# Patient Record
Sex: Male | Born: 1935 | Race: White | Hispanic: No | Smoking: Former smoker
Health system: Southern US, Community
[De-identification: ages and names within clinical notes are randomized; demographics above are authoritative.]

## PROBLEM LIST (undated history)

## (undated) DIAGNOSIS — Z950 Presence of cardiac pacemaker: Secondary | ICD-10-CM

## (undated) DIAGNOSIS — F329 Major depressive disorder, single episode, unspecified: Secondary | ICD-10-CM

## (undated) DIAGNOSIS — I701 Atherosclerosis of renal artery: Secondary | ICD-10-CM

## (undated) DIAGNOSIS — H348392 Tributary (branch) retinal vein occlusion, unspecified eye, stable: Secondary | ICD-10-CM

## (undated) DIAGNOSIS — Z860100 Personal history of colon polyps, unspecified: Secondary | ICD-10-CM

## (undated) DIAGNOSIS — I10 Essential (primary) hypertension: Secondary | ICD-10-CM

## (undated) DIAGNOSIS — J449 Chronic obstructive pulmonary disease, unspecified: Secondary | ICD-10-CM

## (undated) DIAGNOSIS — Z87891 Personal history of nicotine dependence: Secondary | ICD-10-CM

## (undated) DIAGNOSIS — E785 Hyperlipidemia, unspecified: Secondary | ICD-10-CM

## (undated) DIAGNOSIS — L409 Psoriasis, unspecified: Secondary | ICD-10-CM

## (undated) DIAGNOSIS — Z952 Presence of prosthetic heart valve: Secondary | ICD-10-CM

## (undated) DIAGNOSIS — I5032 Chronic diastolic (congestive) heart failure: Secondary | ICD-10-CM

## (undated) DIAGNOSIS — M1990 Unspecified osteoarthritis, unspecified site: Secondary | ICD-10-CM

## (undated) DIAGNOSIS — E119 Type 2 diabetes mellitus without complications: Secondary | ICD-10-CM

## (undated) DIAGNOSIS — N3941 Urge incontinence: Secondary | ICD-10-CM

## (undated) DIAGNOSIS — I5030 Unspecified diastolic (congestive) heart failure: Secondary | ICD-10-CM

## (undated) DIAGNOSIS — I350 Nonrheumatic aortic (valve) stenosis: Secondary | ICD-10-CM

## (undated) DIAGNOSIS — F32A Depression, unspecified: Secondary | ICD-10-CM

## (undated) DIAGNOSIS — I4892 Unspecified atrial flutter: Secondary | ICD-10-CM

## (undated) DIAGNOSIS — I2510 Atherosclerotic heart disease of native coronary artery without angina pectoris: Secondary | ICD-10-CM

## (undated) DIAGNOSIS — I6529 Occlusion and stenosis of unspecified carotid artery: Secondary | ICD-10-CM

## (undated) DIAGNOSIS — I480 Paroxysmal atrial fibrillation: Secondary | ICD-10-CM

## (undated) DIAGNOSIS — I459 Conduction disorder, unspecified: Secondary | ICD-10-CM

## (undated) DIAGNOSIS — I700 Atherosclerosis of aorta: Secondary | ICD-10-CM

## (undated) DIAGNOSIS — I441 Atrioventricular block, second degree: Secondary | ICD-10-CM

## (undated) DIAGNOSIS — K635 Polyp of colon: Secondary | ICD-10-CM

## (undated) DIAGNOSIS — K219 Gastro-esophageal reflux disease without esophagitis: Secondary | ICD-10-CM

## (undated) DIAGNOSIS — S32010A Wedge compression fracture of first lumbar vertebra, initial encounter for closed fracture: Secondary | ICD-10-CM

## (undated) HISTORY — DX: Personal history of colonic polyps: Z86.010

## (undated) HISTORY — DX: Major depressive disorder, single episode, unspecified: F32.9

## (undated) HISTORY — DX: Wedge compression fracture of first lumbar vertebra, initial encounter for closed fracture: S32.010A

## (undated) HISTORY — DX: Essential (primary) hypertension: I10

## (undated) HISTORY — DX: Personal history of colon polyps, unspecified: Z86.0100

## (undated) HISTORY — DX: Depression, unspecified: F32.A

## (undated) HISTORY — DX: Atherosclerosis of renal artery: I70.1

## (undated) HISTORY — DX: Chronic obstructive pulmonary disease, unspecified: J44.9

## (undated) HISTORY — DX: Hyperlipidemia, unspecified: E78.5

## (undated) HISTORY — DX: Unspecified osteoarthritis, unspecified site: M19.90

## (undated) HISTORY — DX: Urge incontinence: N39.41

## (undated) HISTORY — PX: CORONARY ANGIOPLASTY: SHX604

## (undated) HISTORY — DX: Occlusion and stenosis of unspecified carotid artery: I65.29

## (undated) HISTORY — DX: Polyp of colon: K63.5

## (undated) HISTORY — DX: Type 2 diabetes mellitus without complications: E11.9

## (undated) HISTORY — DX: Psoriasis, unspecified: L40.9

## (undated) HISTORY — DX: Atherosclerosis of aorta: I70.0

## (undated) HISTORY — PX: TONSILLECTOMY: SUR1361

## (undated) HISTORY — DX: Atherosclerotic heart disease of native coronary artery without angina pectoris: I25.10

## (undated) HISTORY — DX: Gastro-esophageal reflux disease without esophagitis: K21.9

## (undated) HISTORY — DX: Tributary (branch) retinal vein occlusion, unspecified eye, stable: H34.8392

---

## 1993-10-04 DIAGNOSIS — E119 Type 2 diabetes mellitus without complications: Secondary | ICD-10-CM

## 1993-10-04 HISTORY — DX: Type 2 diabetes mellitus without complications: E11.9

## 1997-10-04 DIAGNOSIS — I6529 Occlusion and stenosis of unspecified carotid artery: Secondary | ICD-10-CM

## 1997-10-04 HISTORY — PX: CAROTID ENDARTERECTOMY: SUR193

## 1997-10-04 HISTORY — DX: Occlusion and stenosis of unspecified carotid artery: I65.29

## 2001-10-04 DIAGNOSIS — K219 Gastro-esophageal reflux disease without esophagitis: Secondary | ICD-10-CM

## 2001-10-04 HISTORY — DX: Gastro-esophageal reflux disease without esophagitis: K21.9

## 2001-10-04 HISTORY — PX: UPPER GASTROINTESTINAL ENDOSCOPY: SHX188

## 2001-10-04 HISTORY — PX: COLONOSCOPY: SHX174

## 2002-10-04 DIAGNOSIS — I701 Atherosclerosis of renal artery: Secondary | ICD-10-CM

## 2002-10-04 DIAGNOSIS — I2510 Atherosclerotic heart disease of native coronary artery without angina pectoris: Secondary | ICD-10-CM

## 2002-10-04 HISTORY — DX: Atherosclerosis of renal artery: I70.1

## 2002-10-04 HISTORY — DX: Atherosclerotic heart disease of native coronary artery without angina pectoris: I25.10

## 2003-03-05 ENCOUNTER — Inpatient Hospital Stay (HOSPITAL_COMMUNITY): Admission: EM | Admit: 2003-03-05 | Discharge: 2003-03-12 | Payer: Self-pay | Admitting: Emergency Medicine

## 2003-03-05 ENCOUNTER — Encounter: Payer: Self-pay | Admitting: Emergency Medicine

## 2003-03-06 HISTORY — PX: CARDIAC CATHETERIZATION: SHX172

## 2003-03-07 ENCOUNTER — Encounter: Payer: Self-pay | Admitting: Cardiothoracic Surgery

## 2003-03-07 HISTORY — PX: CORONARY ARTERY BYPASS GRAFT: SHX141

## 2003-03-08 ENCOUNTER — Encounter: Payer: Self-pay | Admitting: Cardiothoracic Surgery

## 2003-03-09 ENCOUNTER — Encounter: Payer: Self-pay | Admitting: Cardiothoracic Surgery

## 2003-03-10 ENCOUNTER — Encounter: Payer: Self-pay | Admitting: Cardiothoracic Surgery

## 2003-04-26 ENCOUNTER — Encounter: Admission: RE | Admit: 2003-04-26 | Discharge: 2003-04-26 | Payer: Self-pay | Admitting: Cardiothoracic Surgery

## 2003-04-26 ENCOUNTER — Encounter: Payer: Self-pay | Admitting: Cardiothoracic Surgery

## 2003-10-05 DIAGNOSIS — K635 Polyp of colon: Secondary | ICD-10-CM

## 2003-10-05 HISTORY — DX: Polyp of colon: K63.5

## 2004-07-18 ENCOUNTER — Emergency Department: Payer: Self-pay | Admitting: Emergency Medicine

## 2004-07-18 ENCOUNTER — Emergency Department (HOSPITAL_COMMUNITY): Admission: EM | Admit: 2004-07-18 | Discharge: 2004-07-19 | Payer: Self-pay

## 2004-08-24 ENCOUNTER — Ambulatory Visit (HOSPITAL_COMMUNITY): Admission: RE | Admit: 2004-08-24 | Discharge: 2004-08-24 | Payer: Self-pay | Admitting: Neurological Surgery

## 2005-06-14 ENCOUNTER — Ambulatory Visit: Payer: Self-pay | Admitting: Family Medicine

## 2005-07-20 ENCOUNTER — Ambulatory Visit: Payer: Self-pay | Admitting: Internal Medicine

## 2005-09-02 ENCOUNTER — Ambulatory Visit: Payer: Self-pay | Admitting: Internal Medicine

## 2005-10-06 ENCOUNTER — Ambulatory Visit: Payer: Self-pay | Admitting: Internal Medicine

## 2005-11-30 ENCOUNTER — Ambulatory Visit: Payer: Self-pay | Admitting: Internal Medicine

## 2006-03-02 ENCOUNTER — Ambulatory Visit: Payer: Self-pay | Admitting: Internal Medicine

## 2006-06-01 ENCOUNTER — Ambulatory Visit: Payer: Self-pay | Admitting: Internal Medicine

## 2006-08-10 ENCOUNTER — Ambulatory Visit: Payer: Self-pay | Admitting: Internal Medicine

## 2006-08-15 ENCOUNTER — Ambulatory Visit: Payer: Self-pay | Admitting: Internal Medicine

## 2007-01-31 ENCOUNTER — Encounter: Payer: Self-pay | Admitting: Internal Medicine

## 2007-01-31 DIAGNOSIS — E7800 Pure hypercholesterolemia, unspecified: Secondary | ICD-10-CM

## 2007-01-31 DIAGNOSIS — D126 Benign neoplasm of colon, unspecified: Secondary | ICD-10-CM | POA: Insufficient documentation

## 2007-01-31 DIAGNOSIS — F528 Other sexual dysfunction not due to a substance or known physiological condition: Secondary | ICD-10-CM

## 2007-01-31 DIAGNOSIS — E1165 Type 2 diabetes mellitus with hyperglycemia: Secondary | ICD-10-CM

## 2007-01-31 DIAGNOSIS — I2510 Atherosclerotic heart disease of native coronary artery without angina pectoris: Secondary | ICD-10-CM | POA: Insufficient documentation

## 2007-01-31 DIAGNOSIS — J309 Allergic rhinitis, unspecified: Secondary | ICD-10-CM | POA: Insufficient documentation

## 2007-01-31 DIAGNOSIS — K219 Gastro-esophageal reflux disease without esophagitis: Secondary | ICD-10-CM

## 2007-01-31 DIAGNOSIS — L408 Other psoriasis: Secondary | ICD-10-CM

## 2007-01-31 DIAGNOSIS — F411 Generalized anxiety disorder: Secondary | ICD-10-CM | POA: Insufficient documentation

## 2007-01-31 DIAGNOSIS — I10 Essential (primary) hypertension: Secondary | ICD-10-CM

## 2007-01-31 DIAGNOSIS — E118 Type 2 diabetes mellitus with unspecified complications: Secondary | ICD-10-CM

## 2007-02-13 ENCOUNTER — Ambulatory Visit: Payer: Self-pay | Admitting: Internal Medicine

## 2007-02-14 LAB — CONVERTED CEMR LAB
Albumin: 4 g/dL (ref 3.5–5.2)
BUN: 15 mg/dL (ref 6–23)
CO2: 30 meq/L (ref 19–32)
Calcium: 9.2 mg/dL (ref 8.4–10.5)
Chloride: 106 meq/L (ref 96–112)
Cholesterol: 116 mg/dL (ref 0–200)
Creatinine, Ser: 0.9 mg/dL (ref 0.4–1.5)
Creatinine,U: 30.8 mg/dL
GFR calc Af Amer: 107 mL/min
GFR calc non Af Amer: 88 mL/min
Glucose, Bld: 222 mg/dL — ABNORMAL HIGH (ref 70–99)
HDL: 29.5 mg/dL — ABNORMAL LOW (ref >39.0)
Hgb A1c MFr Bld: 9.9 % — ABNORMAL HIGH (ref 4.6–6.0)
LDL Cholesterol: 61 mg/dL (ref 0–99)
Microalb Creat Ratio: 22.7 mg/g (ref 0.0–30.0)
Microalb, Ur: 0.7 mg/dL (ref 0.0–1.9)
Phosphorus: 3.6 mg/dL (ref 2.3–4.6)
Potassium: 4.5 meq/L (ref 3.5–5.1)
Sodium: 140 meq/L (ref 135–145)
TSH: 1.35 microintl units/mL (ref 0.35–5.50)
Total CHOL/HDL Ratio: 3.9
Triglycerides: 126 mg/dL (ref 0–149)
VLDL: 25 mg/dL (ref 0–40)

## 2007-03-14 ENCOUNTER — Encounter: Payer: Self-pay | Admitting: Internal Medicine

## 2007-03-15 ENCOUNTER — Ambulatory Visit: Payer: Self-pay | Admitting: Internal Medicine

## 2007-03-30 ENCOUNTER — Ambulatory Visit: Payer: Self-pay | Admitting: Internal Medicine

## 2007-06-19 ENCOUNTER — Ambulatory Visit: Payer: Self-pay | Admitting: Internal Medicine

## 2007-06-19 DIAGNOSIS — R1013 Epigastric pain: Secondary | ICD-10-CM | POA: Insufficient documentation

## 2007-06-20 LAB — CONVERTED CEMR LAB
ALT: 41 units/L (ref 0–53)
AST: 46 units/L — ABNORMAL HIGH (ref 0–37)
Albumin: 4 g/dL (ref 3.5–5.2)
Alkaline Phosphatase: 71 units/L (ref 39–117)
Amylase: 38 units/L (ref 27–131)
BUN: 14 mg/dL (ref 6–23)
Basophils Absolute: 0.1 10*3/uL (ref 0.0–0.1)
Basophils Relative: 1.5 % — ABNORMAL HIGH (ref 0.0–1.0)
Bilirubin, Direct: 0.1 mg/dL (ref 0.0–0.3)
CO2: 30 meq/L (ref 19–32)
Calcium: 9.6 mg/dL (ref 8.4–10.5)
Chloride: 101 meq/L (ref 96–112)
Creatinine, Ser: 0.9 mg/dL (ref 0.4–1.5)
Eosinophils Absolute: 0.2 10*3/uL (ref 0.0–0.6)
Eosinophils Relative: 3.5 % (ref 0.0–5.0)
GFR calc Af Amer: 107 mL/min
GFR calc non Af Amer: 88 mL/min
Glucose, Bld: 306 mg/dL — ABNORMAL HIGH (ref 70–99)
H Pylori IgG: NEGATIVE
HCT: 42.5 % (ref 39.0–52.0)
Hemoglobin: 14.2 g/dL (ref 13.0–17.0)
Hgb A1c MFr Bld: 10.5 % — ABNORMAL HIGH (ref 4.6–6.0)
Lipase: 25 units/L (ref 11.0–59.0)
Lymphocytes Relative: 27.4 % (ref 12.0–46.0)
MCHC: 33.4 g/dL (ref 30.0–36.0)
MCV: 90.2 fL (ref 78.0–100.0)
Monocytes Absolute: 0.6 10*3/uL (ref 0.2–0.7)
Monocytes Relative: 11.4 % — ABNORMAL HIGH (ref 3.0–11.0)
Neutro Abs: 2.9 10*3/uL (ref 1.4–7.7)
Neutrophils Relative %: 56.2 % (ref 43.0–77.0)
Phosphorus: 4 mg/dL (ref 2.3–4.6)
Platelets: 172 10*3/uL (ref 150–400)
Potassium: 5.2 meq/L — ABNORMAL HIGH (ref 3.5–5.1)
RBC: 4.71 M/uL (ref 4.22–5.81)
RDW: 12 % (ref 11.5–14.6)
Sodium: 136 meq/L (ref 135–145)
TSH: 1.33 microintl units/mL (ref 0.35–5.50)
Total Bilirubin: 0.7 mg/dL (ref 0.3–1.2)
Total Protein: 6.6 g/dL (ref 6.0–8.3)
WBC: 5.2 10*3/uL (ref 4.5–10.5)

## 2007-06-22 ENCOUNTER — Ambulatory Visit: Payer: Self-pay | Admitting: Internal Medicine

## 2007-06-22 ENCOUNTER — Encounter: Payer: Self-pay | Admitting: Internal Medicine

## 2007-07-05 ENCOUNTER — Ambulatory Visit: Payer: Self-pay | Admitting: Internal Medicine

## 2007-07-17 ENCOUNTER — Encounter: Payer: Self-pay | Admitting: Internal Medicine

## 2007-07-21 ENCOUNTER — Telehealth (INDEPENDENT_AMBULATORY_CARE_PROVIDER_SITE_OTHER): Payer: Self-pay | Admitting: *Deleted

## 2007-09-13 ENCOUNTER — Encounter: Payer: Self-pay | Admitting: Internal Medicine

## 2007-09-21 ENCOUNTER — Ambulatory Visit: Payer: Self-pay | Admitting: Internal Medicine

## 2007-09-21 DIAGNOSIS — F331 Major depressive disorder, recurrent, moderate: Secondary | ICD-10-CM

## 2007-09-21 DIAGNOSIS — M1990 Unspecified osteoarthritis, unspecified site: Secondary | ICD-10-CM

## 2007-09-21 DIAGNOSIS — M79609 Pain in unspecified limb: Secondary | ICD-10-CM

## 2007-09-21 LAB — CONVERTED CEMR LAB
ALT: 21 units/L (ref 0–53)
Albumin: 3.8 g/dL (ref 3.5–5.2)
BUN: 14 mg/dL (ref 6–23)
CO2: 30 meq/L (ref 19–32)
Calcium: 9.7 mg/dL (ref 8.4–10.5)
Chloride: 104 meq/L (ref 96–112)
Cholesterol: 168 mg/dL (ref 0–200)
Creatinine, Ser: 0.9 mg/dL (ref 0.4–1.5)
GFR calc Af Amer: 106 mL/min
GFR calc non Af Amer: 88 mL/min
Glucose, Bld: 129 mg/dL — ABNORMAL HIGH (ref 70–99)
HDL: 33.9 mg/dL — ABNORMAL LOW (ref >39.0)
Hgb A1c MFr Bld: 7.1 % — ABNORMAL HIGH (ref 4.6–6.0)
LDL Cholesterol: 115 mg/dL — ABNORMAL HIGH (ref 0–99)
Phosphorus: 3.8 mg/dL (ref 2.3–4.6)
Potassium: 4.8 meq/L (ref 3.5–5.1)
Sodium: 140 meq/L (ref 135–145)
Total CHOL/HDL Ratio: 5
Triglycerides: 95 mg/dL (ref 0–149)
VLDL: 19 mg/dL (ref 0–40)

## 2007-10-12 ENCOUNTER — Encounter: Payer: Self-pay | Admitting: Internal Medicine

## 2007-10-19 ENCOUNTER — Telehealth: Payer: Self-pay | Admitting: Internal Medicine

## 2007-11-17 ENCOUNTER — Telehealth (INDEPENDENT_AMBULATORY_CARE_PROVIDER_SITE_OTHER): Payer: Self-pay | Admitting: *Deleted

## 2007-12-21 ENCOUNTER — Encounter: Payer: Self-pay | Admitting: Internal Medicine

## 2008-02-12 ENCOUNTER — Telehealth (INDEPENDENT_AMBULATORY_CARE_PROVIDER_SITE_OTHER): Payer: Self-pay | Admitting: *Deleted

## 2008-03-05 ENCOUNTER — Telehealth: Payer: Self-pay | Admitting: Internal Medicine

## 2008-03-06 ENCOUNTER — Telehealth (INDEPENDENT_AMBULATORY_CARE_PROVIDER_SITE_OTHER): Payer: Self-pay | Admitting: *Deleted

## 2008-06-11 ENCOUNTER — Telehealth: Payer: Self-pay | Admitting: Internal Medicine

## 2008-07-01 ENCOUNTER — Telehealth: Payer: Self-pay | Admitting: Internal Medicine

## 2008-08-12 ENCOUNTER — Telehealth (INDEPENDENT_AMBULATORY_CARE_PROVIDER_SITE_OTHER): Payer: Self-pay | Admitting: *Deleted

## 2008-11-14 ENCOUNTER — Telehealth: Payer: Self-pay | Admitting: Internal Medicine

## 2008-12-20 ENCOUNTER — Telehealth (INDEPENDENT_AMBULATORY_CARE_PROVIDER_SITE_OTHER): Payer: Self-pay | Admitting: *Deleted

## 2009-01-13 ENCOUNTER — Telehealth: Payer: Self-pay | Admitting: Internal Medicine

## 2010-11-04 HISTORY — PX: CARDIOVASCULAR STRESS TEST: SHX262

## 2011-02-19 NOTE — Assessment & Plan Note (Signed)
[facility]                                 ON-CALL NOTE   DAGEN, BEEVERS                        MRN:          [id number]  DATE:07/08/2007                            DOB:          July 18, 1932    The caller is Tyrone Nine on July 08, 2007.   PHONE NUMBER:  [phone].   PRIMARY CARE PHYSICIAN:  Dr. Alphonsus Sias.   CHIEF COMPLAINT:  Blood sugar is running low.   The patient was referred to the [facility] and is on Lantus 20  units q.h.s.  Blood sugar this morning was 57.  She wants to know if  they need to change the dose.  The patient was instructed to eat  something to get sugar up and monitor frequently today. To call back if  sugars are not increasing.  She is to go ahead and cut Lantus insulin  back to 18 which was the previous dose and she will update if not  improved and follow up with Dr. Alphonsus Sias.     Marne A. Tower, MD  Electronically Signed    MAT/MedQ  DD: 07/08/2007  DT: 07/08/2007  Job #: [id number]

## 2011-02-19 NOTE — Cardiovascular Report (Signed)
NAME:  ALEXES, LAMARQUE                           ACCOUNT NO.:  [id number]   MEDICAL RECORD NO.:  [id number]                   PATIENT TYPE:  INP   LOCATION:  3713                                 FACILITY:  [facility]   PHYSICIAN:  Nicki Guadalajara, M.D.                  DATE OF BIRTH:  [birth year]   DATE OF PROCEDURE:  03/06/2003  DATE OF DISCHARGE:                              CARDIAC CATHETERIZATION   INDICATIONS FOR PROCEDURE:  Mr. Arthur Speagle is a 78 year old white male  with a 10 year history of diabetes mellitus, 15 year history of hypertension  and known peripheral vascular disease status post left carotid  endarterectomy.  There is a strong family history of coronary artery disease  as well as history of hyperlipidemia and tobacco use.  The patient was  admitted to [facility] with chest pain as well as palpitations.  He  is now referred for definitive diagnostic cardiac catheterization.   PROCEDURE:  After premedication with Valium 5 mg intravenously, the patient  was prepped and draped in the usual fashion.  His right femoral artery was  punctured anteriorly and a 6 French sheath was inserted.  Diagnostic  catheterization was done with 6 French Judkins 4 left and right coronary  catheters.  A 6 French pigtail catheter was used for biplane selective left  ventriculography as well as distal aortography.  Selective angiography was  also done into the left subclavian and internal mammary artery with the  right catheter.  Hemostasis was obtained by direct manual pressure. The  patient tolerated the procedure well.  Returned to his room in satisfactory  condition.   HEMODYNAMIC DATA:  1. Central aortic pressure was 140/74.  2. Left ventricular pressure was 140/19.   ANGIOGRAPHIC DATA:  There was extensive calcification of the left main  coronary artery.  There was suggestion of at least 50% ostial tapering  although the catheter did not dampen.  There did appear to an  inferior  plaque shelf to the ostial stenosis.  In addition, there was 40% distal left  main tapering prior to trifurcating into an LAD, a ramus intermediate vessel  and a left circumflex coronary artery.   The  LAD seemed to be diffusely diseased proximally without focal stenosis,  but the whole area seemed to be narrowed to approximately 40-50%.  There was  40% ostial diagonal narrowing.  The LAD wrapped around the apex.   The intermediate vessel had diffuse 95% very proximal stenosis prior to  bifurcating into a superior inferior branch.  There was 80-90% stenosis in  the superior branch and 90% stenosis proximally in the inferior branch.   The AV groove circumflex had  60% narrowing proximally and also had 60% OM  stenosis proximally in a large OM vessel.   The right coronary artery was a small caliber vessel and had diffuse  narrowing of 70% proximally, 80-90% more  focally in the mid segment after an  anterior RV marginal branch.  There was 80% stenosis in the proximal portion  of this anterior marginal branch prior to its bifurcation.  There was 30%  narrowing in the region of the crux just after a small acute margin branch.  The right coronary artery ended in a PDA.  There was focal narrowing of 80-  90% in the proximal third of the PDA vessel.  The posterolateral system  arose from the circumflex vessel.   The left subclavian and internal mammary artery were patent without  stenoses.   Biplane selective left ventriculography revealed normal LV function.  There  was a suggestion of very mild mitral valve prolapse with trivial MR.   DISTAL AORTOGRAPHY:  Distal aortography did reveal bilateral renal artery  narrowing.  The right renal artery had 70% proximal stenosis.  The left  renal artery seemed to have an eccentric shelf with stenosis of 70% in the  proximal third followed by area of ectasia/possible fibrodysplasia.  There  was very small aneurysmal dilatation of the  infrarenal aorta just proximal  to the iliac bifurcation.   IMPRESSION:  1. Normal left ventricular function.  2. Very mild mitral valve prolapse with trivial mitral regurgitation.  3. Multivessel coronary artery disease with significant coronary     calcification involving the ostium and entire left main coronary artery     with 50% ostial narrowing followed by 40% distal left main stenosis;     diffuse narrowing of 40-50% in the proximal LAD with 40% diagonal     narrowing; 95% diffuse proximal ramus intermediate stenosis with 80-90%     stenosis in the superior branch and 90% stenosis in the inferior branch     of this ramus intermediate vessel; 60% proximal circumflex stenosis with     60% OM stenosis; and small right coronary artery with diffuse 70-80%     proximal stenosis followed by 80-90% mid stenosis, 30% stenosis in the     region of the crux and 80-90% stenosis in the mid portion of the PDA     vessel.  4. 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   DISCUSSION:  Mr. Uemura has multiple coronary risk factors including  hypertension, diabetes mellitus, tobacco use, hyperlipidemia and strong  family history of coronary disease.  He also has peripheral vascular disease  and is status post left carotid endarterectomy.  Catheterization today shows  multivessel coronary artery disease.  CVTS consultation will be obtained and  angiograms will also be reviewed with colleagues.                                                 Nicki Guadalajara, M.D.    TK/MEDQ  D:  03/06/2003  T:  03/06/2003  Job:  [id number]   cc:   Alvie Heidelberg

## 2011-02-19 NOTE — Op Note (Signed)
NAME:  Charles [name], Charles [name]                           ACCOUNT NO.:  [id number]   MEDICAL RECORD NO.:  [id number]                   PATIENT TYPE:  INP   LOCATION:  2308                                 FACILITY:  [facility]   PHYSICIAN:  Kerin Perna III, M.D.           DATE OF BIRTH:  March 12, 1932   DATE OF PROCEDURE:  03/07/2003  DATE OF DISCHARGE:                                 OPERATIVE REPORT   PREOPERATIVE DIAGNOSIS:  Class IV unstable angina with severe three vessel  coronary artery disease and 60% left main stenosis.   POSTOPERATIVE DIAGNOSIS:  Class IV unstable angina with severe three vessel  coronary artery disease and 60% left main stenosis.   OPERATION PERFORMED:  Coronary artery bypass grafting times four (left  internal mammary artery to left anterior descending, saphenous vein graft to  ramus intermediate, saphenous vein graft to obtuse marginal, saphenous vein  graft to right coronary artery).   SURGEON:  Kerin Perna, M.D.   ASSISTANT:  Toribio Harbour, N.P.   ANESTHESIA:  General.   ANESTHESIOLOGIST:  Dr. Jean Rosenthal.   INDICATIONS FOR PROCEDURE:  The patient is a 77 year old diabetic with  recent onset of symptoms suggesting unstable angina.  Cardiac  catheterization by Dr. Tresa Endo demonstrated a left main stenosis with severe  three vessel disease and preserved LV function. He was felt to be a  candidate for surgical revascularization based on his symptoms as well as  his bad coronary anatomy.  Prior to surgery, I examined the patient in his  hospital room and reviewed the results of the cardiac catheterization with  the patient and his family.  I discussed the indications and expected  benefits of coronary artery bypass grafting surgery for treatment of his  coronary artery disease.  I reviewed the major alternatives to surgical  therapy as well and the expected outcome of those alternative therapies.  I  discussed with the patient the major aspects of the  proposed procedure  including the location of the surgical incisions, the choice of conduit for  grafting, the use of general anesthesia and cardiopulmonary bypass and the  expected postoperative hospital recovery period.  I discussed with the  patient the risks to him of coronary artery bypass surgery including the  risks of MI, CVA, bleeding, blood transfusion requirement, infection and  death.  He understood these implications for the surgery and agreed to  proceed with the operation as planned under what I felt was an informed  consent.   OPERATIVE FINDINGS:  The patient has cardiomegaly consistent with his  history of hypertension.  The coronaries were small and diffusely diseased  consistent with history of diabetes.  The conduit was of average to above  average quality and the vein was harvested from the right leg using  endoscopic technique.  The ramus vessels and circumflex vessels were  intramyocardial.  The right coronary was nondominant but graftable.  DESCRIPTION OF PROCEDURE:  The patient was brought to the operating room and  placed supine on the operating table where general anesthesia was induced  under invasive hemodynamic monitoring.  The chest, abdomen and legs were  prepped with Betadine and draped as a sterile field.  A sternal incision was  made as the saphenous vein was harvested from the right leg.  The left  internal mammary artery was harvested as a pedicle graft from its origin at  the subclavian vessels.  Heparin was administered and the ACT was documented  as being therapeutic. A  sternal retractor was placed. The pericardium was  opened.  Through pursestrings placed in the ascending aorta and right  atrium, the patient was cannulated and placed on bypass and cooled to 32  degrees.  The coronaries were identified for grafting and the mammary artery  and vein grafts were prepared for the distal anastomoses.  A cardioplegia  cannula was placed and the patient  was cooled to 30 degrees.  The aortic  cross-clamp was applied.  of cold blood cardioplegia was delivered to  the aortic root with immediate cardioplegic arrest and septal temperature  dropping less than 14 degrees.  Topical iced saline slush was used to  augment myocardial preservation and a pericardial insulator pad was used to  protect the left phrenic nerve.   The distal coronary anastomoses were then performed. The first distal  anastomosis was to the distal RCA.  This was a 1.2 mm vessel, nondominant  with a proximal 95 to 99% stenosis.  A reversed saphenous vein that was  small caliber was used to sew end-to-side with running 7-0 Prolene.  There  was good flow through the graft.  The second distal anastomosis was to the  obtuse marginal.  This was a 1.8 mm vessel intramyocardial with a proximal  80% stenosis.  A reversed saphenous vein was sewn end-to-side with running 7-  0 Prolene and there was good flow through the graft.  The third distal  anastomosis was to the ramus intermediate.  This was a 1.5 mm  intramyocardial vessel with a proximal 90% stenosis.  A reversed saphenous  vein was sewn end-to-side with running 7-0 Prolene with good flow through  the graft.  Cardioplegia was redosed.  The fourth distal anastomosis was to  the distal third of the LAD where it became epicardial in its location.  The  left internal mammary artery pedicle was brought through an opening created  in the left lateral pericardium and was brought down onto the LAD and sewn  end-to-side with a running 8-0 Prolene.  There was excellent flow through  the anastomosis with immediate rise in septal temperature after release of  the pedicle clamp on the mammary artery.  The pedicle was secured to the  epicardium with two Prolene sutures.   The cross-clamp was released.  Three proximal vein anastomoses were placed  on the ascending aorta using a 4.0 mm punch and running 6-0 Prolene.  The partial clamp  was removed and the vein grafts were perfused.  Each had good  flow and hemostasis was documented at proximal and distal anastomoses.  The  patient was rewarmed and reperfused to 37 degrees.  The lungs were expanded  and the ventilator was resumed.  Pacing wires were applied.  The patient was  weaned from bypass without difficulty, cardiac output and blood pressure  were stable.  Protamine was administered without adverse reaction.  The  cannulas were removed.  The mediastinum was irrigated with warm antibiotic  irrigation.  The leg incision was irrigated and closed in a standard  fashion.  The pericardium was loosely reapproximated superiorly.  Two  mediastinal and a left pleural chest tube were placed and brought out  through  separate incisions.  The sternum was reapproximated with interrupted steel  wire.  The pectoralis fascia was closed with a running #1 Vicryl.  A running  Vicryl was used to close the subcutaneous and skin and sterile dressing was  applied.  Total bypass time was 120 minutes with an aortic cross-clamp time  of 68 minutes.                                               Mikey Bussing, M.D.    PV/MEDQ  D:  03/07/2003  T:  03/07/2003  Job:  [id number]   cc:   Nicki Guadalajara, M.D.  [phone] [address]. [address], [address]  [place], [place] [address]  Fax: [phone]   Madaline Savage, M.D.  [phone] [address]. [address]  [place]  [place] [address]  Fax: [phone]

## 2011-02-19 NOTE — Discharge Summary (Signed)
NAME:  Charles [name], Charles [name]                           ACCOUNT NO.:  [id number]   MEDICAL RECORD NO.:  [id number]                   PATIENT TYPE:  INP   LOCATION:  2018                                 FACILITY:  [facility]   PHYSICIAN:  Kerin Perna, M.D.               DATE OF BIRTH:  September 15, 1932   DATE OF ADMISSION:  03/05/2003  DATE OF DISCHARGE:  03/12/2003                                 DISCHARGE SUMMARY   DISCHARGE DIAGNOSES:  1. Class IV unstable angina.  2. Severe three-vessel coronary artery disease with 60% left main stenosis.  3. Slight erythema at the sternotomy incision on postoperative day #4, on     Keflex at discharge.  4. Renal artery stenosis found on catheterization this hospitalization.   SECONDARY DIAGNOSES:  1. Type 2 diabetes for the last 10 years.  2. Gastroesophageal reflux disease.  3. 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  4. Hyperlipidemia.  5. Hypertension.  6. Ongoing tobacco habituation.   PROCEDURES:  1. March 06, 2003, left heart catheterization, left ventriculogram, and     coronary angiography.  The study showed that there was an extensive     calcification of the left main coronary artery, a 50% ostial tapering, a     40% distal left main tapering.  The left main was diffusely diseased     proximally with a 40 to 50% stenosis.  There was 40% ostial diagonal     narrowing. The intermediate vessel had a diffuse 95% proximal stenosis.     It bifurcated into superior and inferior branches.  There was 80 to 90%     stenosis in the superior branch and a 90% stenosis proximally and in the     inferior branch.  The circumflex had a 60% narrowing proximally.  The     first obtuse marginal had a 60% proximal stenosis.  It was a large     vessel.  The right coronary artery was a small caliber vessel, 70%     proximal stenosis, 80 to 90% in mid segment.  The right coronary artery     ended in a  posterior descending.  There was a focal narrowing of 80 to     90% in the proximal third of this vessel.  There was also evidence of     renal artery stenosis.  The right renal artery had a 70% proximal     stenosis.  The left renal artery had an eccentric shelf with stenosis of     70% in the proximal third.  2. March 06, 2003, carotid duplex ultrasound.  This study showed that the left     had no recurrent stenosis, and the right internal carotid artery was also     free of hemodynamically significant stenosis.  3. March 07, 2003, coronary artery bypass graft surgery x  4, Dr. Kathlee Nations     Trigt surgeon.  In this procedure, the left internal mammary artery was     connected in an end-to-side fashion with the left anterior descending     coronary artery.  A reverse saphenous vein graft was fashioned from the     aorta to the distal right coronary artery, and reverse saphenous vein     grafts were also fashioned from the aorta to the first obtuse marginal,     and another graft fashioned from the aorta to the ramus.  The patient     tolerated the procedure well and was transferred in stable and     satisfactory condition to the intensive care unit.   DISPOSITION:  Charles [name] is ready for discharge on postoperative day  #5.  He has not had any cardiac dysrhythmias in the postoperative period.  He did have mild oxygen dependence postoperatively, but was relieved of all  supplemental oxygen by postoperative day #4.  His mental status has been  clear in the postoperative period.  He has been seen by smoking cessation  counselors secondary to his prolonged history of tobacco habituation and  vascular disease affecting both coronaries and the internal carotid  arteries. He is ambulating independently.  His incision on the right leg has  serosanguinous leakage which is tapering by postoperative day #5.  His  incisions are all healing well except there is a mild erythema noted on   postoperative day #4 at the sternotomy incision, and he was started on  Keflex at that time to last for a 10-day period.  Charles [name] was mildly  volume overloaded and has responded to gentle diuresis in the postoperative  period.  Charles [name] will go home with the following medications.   DISCHARGE MEDICATIONS:  1. Oxycodone 5 mg 1 to 2 tablet p.o. q.4-6h. p.r.n. pain.  2. Enteric-coated aspirin 81 mg daily.  3. Prilosec 20 mg daily.  4. Glucotrol 10 mg daily.  5. Avandia 2 mg daily.  6. Folic acid 1 mg daily.  7. Pravachol 40 mg daily.  8. Cozaar 50 mg daily.  9. Keflex 500 mg p.o. t.i.d.  10.      Lopressor 50 mg 1/2 tablet in the morning, 1/2 tablet in the     evening.  11.      Lasix 40 mg daily for a two-week period.  12.      Potassium chloride 20 mEq daily for a two-week period.  13.      He is to start his hydrochlorothiazide 12.5 mg after Lasix is     finished.   ACTIVITY:  Walk daily to build up his strength.  He is asked not to lift  more than 10 pounds for the next six weeks.  He is not to drive until he  sees Dr. Donata Clay.   DIET:  Low-sodium, low-cholesterol, ADA diet.   WOUND CARE:  He may shower.  He is to keep dry gauze on his leg incision as  long as it is draining.  Home health will check his incisions.  Staples will  be removed from the right leg Tuesday, June 15, by home health.   FOLLOW UP:  He will see Dr. Tresa Endo in two weeks.  He is to call [phone] to  arrange the appointment.  Dr. Zenaida Niece Trigt's office will call him.  He is to  get a chest x-ray at [facility] one hour before his visit.  BRIEF HISTORY:  Charles [name] is a 77 year old male with no prior  history of coronary artery disease; however, he does have a history of  extracranial cerebrovascular occlusive disease, a strong family history of  coronary artery disease.  He has a 10-year history of type 2 diabetes.  He has ongoing tobacco use.  He has a history of hyperlipidemia  and  hypertension.  He visits the [facility] by recommendation of  Dr. Angelique Holm in [place].  He has a recent history of palpitations.  His heart seems to be skipping a beat.  He hears a gurgling in his chest.  This dysrhythmia occurs periodically and yesterday and today.  He has no  associated chest pain, shortness of breath, diaphoresis, presyncope, or  syncope.  Also, he states that for about one year he has had intermittent  epigastric discomfort which rotates around to the back and the right and  left sides.  He had a GI workup without any significant findings earlier  this year.  Electrocardiogram shows no evidence of ischemia.   HOSPITAL COURSE:  The patient was admitted to [facility] on June 1  and scheduled for left heart catheterization after being seen by the  [facility] cardiologist.  This study was done June 2.  The study  showed severe three-vessel atherosclerotic coronary artery disease with left  main involvement. Dr. Kathlee Nations Trigt [facility] was consulted.  He recommended coronary artery bypass  revascularization.  He described the risks and benefits to the patient, and  the patient elected to undergo the  procedure.  The procedure was done on June 3.  Four bypasses were placed as  described above.  His postoperative convalescence has been relatively  uncomplicated and has lasted five days.  Charles [name] will undergo discharge  on June 8 with the medications and followup as dictated above.     Maple Mirza, P.A.                    Kerin Perna, M.D.    GM/MEDQ  D:  03/11/2003  T:  03/11/2003  Job:  [id number]   cc:   Nicki Guadalajara, M.D.  [phone] [address]  [place], [place] [address]  Fax: [phone]   Angelique Holm, M.D.  [facility], [place]

## 2011-03-05 DIAGNOSIS — J449 Chronic obstructive pulmonary disease, unspecified: Secondary | ICD-10-CM

## 2011-03-05 HISTORY — DX: Chronic obstructive pulmonary disease, unspecified: J44.9

## 2011-09-10 LAB — CBC
HGB: 14.7 g/dL
WBC: 4.8

## 2011-09-10 LAB — HEPATIC FUNCTION PANEL
ALT: 10 U/L (ref 10–40)
AST: 14 U/L
Albumin: 4.2
Alkaline Phosphatase: 52 U/L
Total Bilirubin: 0.4 mg/dL

## 2011-09-10 LAB — LIPID PANEL: Triglycerides: 133

## 2011-09-10 LAB — HEMOGLOBIN A1C: A1c: 6.4

## 2011-11-29 HISTORY — PX: OTHER SURGICAL HISTORY: SHX169

## 2012-02-23 ENCOUNTER — Encounter: Payer: Self-pay | Admitting: Family Medicine

## 2012-02-23 ENCOUNTER — Ambulatory Visit (INDEPENDENT_AMBULATORY_CARE_PROVIDER_SITE_OTHER): Payer: Medicare Other | Admitting: Family Medicine

## 2012-02-23 VITALS — BP 142/80 | HR 88 | Temp 97.8°F | Ht 73.0 in | Wt 197.2 lb

## 2012-02-23 DIAGNOSIS — E7800 Pure hypercholesterolemia, unspecified: Secondary | ICD-10-CM

## 2012-02-23 DIAGNOSIS — I10 Essential (primary) hypertension: Secondary | ICD-10-CM

## 2012-02-23 DIAGNOSIS — IMO0001 Reserved for inherently not codable concepts without codable children: Secondary | ICD-10-CM

## 2012-02-23 DIAGNOSIS — E119 Type 2 diabetes mellitus without complications: Secondary | ICD-10-CM

## 2012-02-23 LAB — HEMOGLOBIN A1C: Hgb A1c MFr Bld: 7.1 % — ABNORMAL HIGH (ref 4.6–6.5)

## 2012-02-23 MED ORDER — FUROSEMIDE 40 MG PO TABS: 40.0000 mg | ORAL_TABLET | Freq: Every day | ORAL | Status: DC | PRN

## 2012-02-23 MED ORDER — OMEPRAZOLE 20 MG PO CPDR: 20.0000 mg | DELAYED_RELEASE_CAPSULE | Freq: Every day | ORAL | Status: DC

## 2012-02-23 MED ORDER — METFORMIN HCL 1000 MG PO TABS: 1000.0000 mg | ORAL_TABLET | ORAL | Status: DC

## 2012-02-23 MED ORDER — TRAZODONE HCL 50 MG PO TABS: 50.0000 mg | ORAL_TABLET | Freq: Every day | ORAL | Status: DC

## 2012-02-23 MED ORDER — POTASSIUM CHLORIDE CRYS ER 20 MEQ PO TBCR: 20.0000 meq | EXTENDED_RELEASE_TABLET | Freq: Every day | ORAL | Status: DC

## 2012-02-23 MED ORDER — INSULIN GLARGINE 100 UNIT/ML ~~LOC~~ SOLN: 26.0000 [IU] | Freq: Every day | SUBCUTANEOUS | Status: DC

## 2012-02-23 MED ORDER — SIMVASTATIN 40 MG PO TABS: 40.0000 mg | ORAL_TABLET | Freq: Every day | ORAL | Status: DC

## 2012-02-23 MED ORDER — CITALOPRAM HYDROBROMIDE 40 MG PO TABS: 40.0000 mg | ORAL_TABLET | Freq: Every day | ORAL | Status: DC

## 2012-02-23 MED ORDER — LISINOPRIL 40 MG PO TABS: 40.0000 mg | ORAL_TABLET | Freq: Every day | ORAL | Status: DC

## 2012-02-23 NOTE — Progress Notes (Signed)
Subjective:    Patient ID: Charles [name], male    DOB: Mar 08, 1932, 78 y.o.   MRN: [id number]  HPI CC: new pt to re establish  Prior saw Dr. Alphonsus Sias ,then changed to Dr. Marguerite Olea with [facility].  Was seeing Dr. Hyacinth Meeker endo at [place] but would like to have all diabetes care here.  T2DM - dx 1995.  Initially on oral meds.  Then started on lantus around 2008, currently 26u at night time.  Checks sugars bid.  Also on metformin.  At home running 110s.  Last A1C 6.2% about 6 mo ago.  Thinks may be worse.  Last vision exam was within the year, sees Dr. Hazle Quant.  Foot exam 3 mo ago.  HTN - No HA, vision changes, CP/tightness, SOB, leg swelling.  Compliant with meds.  Tends to get "prostate flares", saw palmetto helps well.  Previously on avodart, didn't notice improvement, took for 3-4 mo.  H/o psoriasis, takes psoriasin for this.  Denies depression/anhedonia.  Wt Readings from Last 3 Encounters:  02/23/12 197 lb 4 oz (89.472 kg)  09/21/07 226 lb (102.513 kg)  06/19/07 227 lb 4 oz (103.08 kg)  lost 56 lbs in last 14 mo.  Body mass index is 26.02 kg/(m^2).  Preventative: Unsure last CPE/wellness visit.  Thinks 6 mo ago. Flu shots yearly. Last tetanus shot about 5 yrs ago.  Medications and allergies reviewed and updated in chart.  Past histories reviewed and updated if relevant as below. Patient Active Problem List  Diagnoses  . COLONIC POLYPS  . DIABETES MELLITUS, TYPE II  . DIABETES MELLITUS, TYPE II, UNCONTROLLED  . HYPERCHOLESTEROLEMIA  . ANXIETY  . ERECTILE DYSFUNCTION  . DEPRESSION  . HYPERTENSION  . CORONARY ARTERY DISEASE  . ATHEROSCLEROTIC CARDIOVASCULAR DISEASE  . ALLERGIC RHINITIS  . GERD  . PSORIASIS  . OSTEOARTHRITIS  . LEG PAIN, RIGHT  . ABDOMINAL PAIN, EPIGASTRIC   Past Medical History  Diagnosis Date  . Arthritis     in lower back  . History of chicken pox   . Depression   . T2DM (type 2 diabetes mellitus) 1995  . Gastric ulcer   . Seasonal allergies   .  CAD (coronary artery disease) 2004    s/p 4v CABG (Dr. Allyson Sabal)  . HTN (hypertension)   . HLD (hyperlipidemia)   . Colon polyp 2005    (Dr. [name]  . Urge incontinence of urine   . Renal artery stenosis     bilateral, followed by cards  . Psoriasis    Past Surgical History  Procedure Date  . Tonsillectomy   . Carotid artery angioplasty 1999  . Coronary artery bypass graft 2004    4v CABG  . Colonoscopy 2005    colon polyp   History  Substance Use Topics  . Smoking status: Current Everyday Smoker -- 0.1 packs/day    Types: Cigarettes  . Smokeless tobacco: Never Used  . Alcohol Use: No   Family History  Problem Relation Age of Onset  . Cancer Brother     lung cancer  . Cancer Brother     pancreatic cancer  . Diabetes Sister   . Cancer Mother     GI cancer  . Hypertension Father   . Coronary artery disease Sister   . Stroke Sister    Allergies  Allergen Reactions  . Other     Horse serum  . Rosiglitazone Maleate     REACTION: Did not help   Current Outpatient Prescriptions on File Prior  to Visit  Medication Sig Dispense Refill  . citalopram (CELEXA) 40 MG tablet Take 1 tablet (40 mg total) by mouth daily.  90 tablet  3  . furosemide (LASIX) 40 MG tablet Take 1 tablet (40 mg total) by mouth daily as needed.  30 tablet  0  . insulin glargine (LANTUS) 100 UNIT/ML injection Inject 26 Units into the skin at bedtime.  10 mL  11  . lisinopril (PRINIVIL,ZESTRIL) 40 MG tablet Take 1 tablet (40 mg total) by mouth daily.  90 tablet  3  . metFORMIN (GLUCOPHAGE) 1000 MG tablet Take 1 tablet (1,000 mg total) by mouth as directed. 1 with breakfast, 1/2 with lunch and 1 with supper  225 tablet  3  . metoprolol (LOPRESSOR) 50 MG tablet Take 25 mg by mouth 2 (two) times daily.      [name] omeprazole (PRILOSEC) 20 MG capsule Take 1 capsule (20 mg total) by mouth daily.  90 capsule  3  . potassium chloride SA (K-DUR,KLOR-CON) 20 MEQ tablet Take 1 tablet (20 mEq total) by mouth daily. When  taking furosemide  30 tablet  0  . simvastatin (ZOCOR) 40 MG tablet Take 1 tablet (40 mg total) by mouth daily.  90 tablet  3  . traZODone (DESYREL) 50 MG tablet Take 1 tablet (50 mg total) by mouth at bedtime.  90 tablet  3    Review of Systems  Constitutional: Negative for fever, chills, activity change, appetite change, fatigue and unexpected weight change.  HENT: Negative for hearing loss and neck pain.   Eyes: Negative for visual disturbance.  Respiratory: Positive for cough (getting over cold). Negative for chest tightness, shortness of breath and wheezing.   Cardiovascular: Negative for chest pain, palpitations and leg swelling.  Gastrointestinal: Negative for nausea, vomiting, abdominal pain, diarrhea, constipation, blood in stool and abdominal distention.  Genitourinary: Negative for hematuria and difficulty urinating.  Musculoskeletal: Negative for myalgias and arthralgias.  Skin: Negative for rash.  Neurological: Negative for dizziness, seizures, syncope and headaches.  Hematological: Does not bruise/bleed easily.  Psychiatric/Behavioral: Negative for dysphoric mood. The patient is not nervous/anxious.        Objective:   Physical Exam  Nursing note and vitals reviewed. Constitutional: He is oriented to person, place, and time. He appears well-developed and well-nourished. No distress.  HENT:  Head: Normocephalic and atraumatic.  Right Ear: Hearing, tympanic membrane, external ear and ear canal normal.  Left Ear: Hearing, tympanic membrane, external ear and ear canal normal.  Nose: Nose normal.  Mouth/Throat: Oropharynx is clear and moist. No oropharyngeal exudate.  Eyes: Conjunctivae and EOM are normal. Pupils are equal, round, and reactive to light. No scleral icterus.  Neck: Normal range of motion. Neck supple. Carotid bruit is not present. No thyromegaly present.  Cardiovascular: Normal rate, regular rhythm, normal heart sounds and intact distal pulses.   No murmur  heard. Pulses:      Radial pulses are 2+ on the right side, and 2+ on the left side.  Pulmonary/Chest: Effort normal and breath sounds normal. No respiratory distress. He has no wheezes. He has no rales.  Abdominal: Soft. Bowel sounds are normal. He exhibits no distension and no mass. There is no tenderness. There is no rebound and no guarding.  Musculoskeletal: Normal range of motion. He exhibits no edema.       Diabetic foot exam: Normal inspection No skin breakdown No calluses  Normal DP/PT pulses Normal sensation to light touch and monofilament Nails normal  Lymphadenopathy:  He has no cervical adenopathy.  Neurological: He is alert and oriented to person, place, and time.       CN grossly intact, station and gait intact  Skin: Skin is warm and dry. No rash noted.  Psychiatric: He has a normal mood and affect. His behavior is normal. Judgment and thought content normal.      Assessment & Plan:

## 2012-02-23 NOTE — Assessment & Plan Note (Signed)
Chronic. Per recollected #s, anticipate good control Check A1c today.

## 2012-02-23 NOTE — Patient Instructions (Signed)
I will request records from Dr. [name] and Allyson Sabal. I will review records from [facility] and call you when ready to pick up. Good to meet you today, call us with questions. Return in 3 months for follow up. I've refilled all you medicines today.

## 2012-02-23 NOTE — Assessment & Plan Note (Signed)
Chronic, stable. Elevated today, will reassess next visit. Discussed goal tighter control.

## 2012-02-23 NOTE — Assessment & Plan Note (Signed)
compliant with zocor.

## 2012-02-28 ENCOUNTER — Encounter: Payer: Self-pay | Admitting: Family Medicine

## 2012-03-07 ENCOUNTER — Encounter: Payer: Self-pay | Admitting: Family Medicine

## 2012-03-18 ENCOUNTER — Encounter: Payer: Self-pay | Admitting: Family Medicine

## 2012-03-23 ENCOUNTER — Encounter: Payer: Self-pay | Admitting: Family Medicine

## 2012-04-26 ENCOUNTER — Other Ambulatory Visit: Payer: Self-pay | Admitting: *Deleted

## 2012-04-26 MED ORDER — METOPROLOL TARTRATE 50 MG PO TABS: 25.0000 mg | ORAL_TABLET | Freq: Two times a day (BID) | ORAL | Status: DC

## 2012-05-12 ENCOUNTER — Other Ambulatory Visit: Payer: Self-pay | Admitting: *Deleted

## 2012-05-12 MED ORDER — FUROSEMIDE 40 MG PO TABS: 40.0000 mg | ORAL_TABLET | Freq: Every day | ORAL | Status: DC | PRN

## 2012-05-25 ENCOUNTER — Ambulatory Visit (INDEPENDENT_AMBULATORY_CARE_PROVIDER_SITE_OTHER): Payer: Medicare Other | Admitting: Family Medicine

## 2012-05-25 ENCOUNTER — Encounter: Payer: Self-pay | Admitting: Family Medicine

## 2012-05-25 VITALS — BP 130/82 | HR 60 | Temp 97.6°F | Wt 197.2 lb

## 2012-05-25 DIAGNOSIS — E7800 Pure hypercholesterolemia, unspecified: Secondary | ICD-10-CM

## 2012-05-25 DIAGNOSIS — R5383 Other fatigue: Secondary | ICD-10-CM | POA: Insufficient documentation

## 2012-05-25 DIAGNOSIS — R5381 Other malaise: Secondary | ICD-10-CM

## 2012-05-25 DIAGNOSIS — I10 Essential (primary) hypertension: Secondary | ICD-10-CM

## 2012-05-25 DIAGNOSIS — E119 Type 2 diabetes mellitus without complications: Secondary | ICD-10-CM

## 2012-05-25 NOTE — Progress Notes (Signed)
  Subjective:    Patient ID: Charles [name], male    DOB: 10-Jan-1932, 78 y.o.   MRN: [id number]  HPI CC: 3 mo f/u DM  Staying fatigued.  Trouble with motivation to do things.  T2DM - dx 1995. Initially on oral meds. Then started on lantus around 2008, currently 26u at night time. Checks sugars tid - fasting and then 2 hours after lunch and dinner. Also on metformin. This morning sugar was 74, at night time 180.  Last vision exam was within the year, sees Dr. Hazle Quant (thinks March 2013).  Rare low sugars.  Some liberties with diet, attributes some high sugars to this. Lab Results  Component Value Date   HGBA1C 7.1* 02/23/2012   Lab Results  Component Value Date   CREATININE 0.8 09/10/2011    Wt Readings from Last 3 Encounters:  05/25/12 197 lb 4 oz (89.472 kg)  02/23/12 197 lb 4 oz (89.472 kg)  09/21/07 226 lb (102.513 kg)   HTN - No HA, vision changes, CP/tightness, SOB, leg swelling. Compliant with meds.  On lisinopril, lasix prn.  Past Medical History  Diagnosis Date  . Arthritis     in lower back  . History of chicken pox   . Depression   . T2DM (type 2 diabetes mellitus) 1995  . GERD (gastroesophageal reflux disease) 2003    h/o duodenal ulcer per EGD  . Seasonal allergies   . CAD (coronary artery disease) 2004    s/p 4v CABG (Dr. Allyson Sabal)  . HTN (hypertension)   . HLD (hyperlipidemia)   . Colon polyp 2005    (Dr. [name]  . Urge incontinence of urine   . Renal artery stenosis 2004    70% bilateral, followed by cards  . Psoriasis   . History of carotid stenosis   . COPD (chronic obstructive pulmonary disease) 03/2011    by xray  . Carotid stenosis 1999    s/p L CEA    Review of Systems Per HPI    Objective:   Physical Exam  Nursing note and vitals reviewed. Constitutional: He appears well-developed and well-nourished. No distress.  HENT:  Head: Normocephalic and atraumatic.  Mouth/Throat: Oropharynx is clear and moist. No oropharyngeal exudate.  Eyes:  Conjunctivae and EOM are normal. Pupils are equal, round, and reactive to light. No scleral icterus.  Neck: Normal range of motion. Neck supple. Carotid bruit is not present.  Cardiovascular: Normal rate, regular rhythm, normal heart sounds and intact distal pulses.   No murmur heard. Pulmonary/Chest: Breath sounds normal. No respiratory distress. He has no wheezes. He has no rales.  Musculoskeletal: He exhibits no edema.  Skin: Skin is warm and dry. No rash noted.  Psychiatric: He has a normal mood and affect.       Assessment & Plan:

## 2012-05-25 NOTE — Assessment & Plan Note (Signed)
Mild. Check for reversible causes when returns fasting for blood work prior to [employer] visit.

## 2012-05-25 NOTE — Assessment & Plan Note (Signed)
Chronic, stable. Compliant with med. Continue.

## 2012-05-25 NOTE — Patient Instructions (Signed)
Good to see you today. Return in December for medicare wellness visit, prior fasting for blood work.

## 2012-05-25 NOTE — Assessment & Plan Note (Signed)
Chronic, stable. Fair control, could be better. Pt requests to defer blood work until next visit [employer]) Will check then.

## 2012-07-04 ENCOUNTER — Ambulatory Visit (INDEPENDENT_AMBULATORY_CARE_PROVIDER_SITE_OTHER): Payer: Medicare Other

## 2012-07-04 DIAGNOSIS — Z23 Encounter for immunization: Secondary | ICD-10-CM

## 2012-09-04 ENCOUNTER — Other Ambulatory Visit (INDEPENDENT_AMBULATORY_CARE_PROVIDER_SITE_OTHER): Payer: Medicare Other

## 2012-09-04 DIAGNOSIS — E7800 Pure hypercholesterolemia, unspecified: Secondary | ICD-10-CM

## 2012-09-04 DIAGNOSIS — R5381 Other malaise: Secondary | ICD-10-CM

## 2012-09-04 DIAGNOSIS — I10 Essential (primary) hypertension: Secondary | ICD-10-CM

## 2012-09-04 DIAGNOSIS — E119 Type 2 diabetes mellitus without complications: Secondary | ICD-10-CM

## 2012-09-04 DIAGNOSIS — Z79899 Other long term (current) drug therapy: Secondary | ICD-10-CM

## 2012-09-04 DIAGNOSIS — R5383 Other fatigue: Secondary | ICD-10-CM

## 2012-09-04 LAB — MICROALBUMIN / CREATININE URINE RATIO
Creatinine,U: 210.7 mg/dL
Microalb, Ur: 6.3 mg/dL — ABNORMAL HIGH (ref 0.0–1.9)

## 2012-09-04 LAB — LIPID PANEL
Cholesterol: 140 mg/dL (ref 0–200)
LDL Cholesterol: 89 mg/dL (ref 0–99)

## 2012-09-04 LAB — COMPREHENSIVE METABOLIC PANEL
Albumin: 4.1 g/dL (ref 3.5–5.2)
Alkaline Phosphatase: 54 U/L (ref 39–117)
CO2: 31 mEq/L (ref 19–32)
Calcium: 9.7 mg/dL (ref 8.4–10.5)
Chloride: 101 mEq/L (ref 96–112)
GFR: 102.31 mL/min (ref >60.00)
Glucose, Bld: 93 mg/dL (ref 70–99)
Potassium: 4.5 mEq/L (ref 3.5–5.1)
Sodium: 139 mEq/L (ref 135–145)
Total Protein: 6.8 g/dL (ref 6.0–8.3)

## 2012-09-04 LAB — CBC WITH DIFFERENTIAL/PLATELET
Basophils Absolute: 0.1 10*3/uL (ref 0.0–0.1)
Eosinophils Absolute: 0.4 10*3/uL (ref 0.0–0.7)
Hemoglobin: 14.5 g/dL (ref 13.0–17.0)
Lymphocytes Relative: 23.5 % (ref 12.0–46.0)
Lymphs Abs: 1.6 10*3/uL (ref 0.7–4.0)
MCHC: 32.9 g/dL (ref 30.0–36.0)
Neutro Abs: 4.2 10*3/uL (ref 1.4–7.7)
Platelets: 186 10*3/uL (ref 150.0–400.0)
RDW: 13.3 % (ref 11.5–14.6)

## 2012-09-04 LAB — VITAMIN B12: Vitamin B-12: 319 pg/mL (ref 211–911)

## 2012-09-04 LAB — HEMOGLOBIN A1C: Hgb A1c MFr Bld: 6.6 % — ABNORMAL HIGH (ref 4.6–6.5)

## 2012-09-11 ENCOUNTER — Other Ambulatory Visit: Payer: Medicare Other

## 2012-09-15 ENCOUNTER — Encounter: Payer: Self-pay | Admitting: Family Medicine

## 2012-09-15 ENCOUNTER — Ambulatory Visit (INDEPENDENT_AMBULATORY_CARE_PROVIDER_SITE_OTHER): Payer: Medicare Other | Admitting: Family Medicine

## 2012-09-15 VITALS — BP 142/84 | HR 60 | Temp 98.1°F | Ht 73.0 in | Wt 197.8 lb

## 2012-09-15 DIAGNOSIS — Z1211 Encounter for screening for malignant neoplasm of colon: Secondary | ICD-10-CM

## 2012-09-15 DIAGNOSIS — Z0000 Encounter for general adult medical examination without abnormal findings: Secondary | ICD-10-CM

## 2012-09-15 DIAGNOSIS — D126 Benign neoplasm of colon, unspecified: Secondary | ICD-10-CM

## 2012-09-15 DIAGNOSIS — I10 Essential (primary) hypertension: Secondary | ICD-10-CM

## 2012-09-15 DIAGNOSIS — E7800 Pure hypercholesterolemia, unspecified: Secondary | ICD-10-CM

## 2012-09-15 DIAGNOSIS — Z1331 Encounter for screening for depression: Secondary | ICD-10-CM

## 2012-09-15 DIAGNOSIS — E119 Type 2 diabetes mellitus without complications: Secondary | ICD-10-CM

## 2012-09-15 NOTE — Patient Instructions (Addendum)
Great blood work today! Start vitamin B12 daily over the counter. Pass by [facility] office for referral for colonoscopy with Dr. [name]. Call your insurance about the shingles shot to see if it is covered or how much it would cost and where is cheaper (here or pharmacy).  If you want to receive here, call for nurse visit. Good to see you today, call us with questions Return in 6 months for follow up diabetes. Keep appointment next year with the eye doctor as you failed vision screen today!

## 2012-09-15 NOTE — Assessment & Plan Note (Signed)
Preventative protocols reviewed and updated unless pt declined. Discussed healthy diet and lifestyle. Refer for colonoscopy. Recommended schedule eye exam as failed vision screen today.

## 2012-09-15 NOTE — Progress Notes (Signed)
Subjective:    Patient ID: Charles [name], male    DOB: 1932/07/09, 78 y.o.   MRN: [id number]  HPI CC: CPE  No concerns today  DM - compliant with meds and lantus.  Did not bring glucometer today.    Wt Readings from Last 3 Encounters:  09/15/12 197 lb 12 oz (89.699 kg)  05/25/12 197 lb 4 oz (89.472 kg)  02/23/12 197 lb 4 oz (89.472 kg)    Failed hearing test - wears hearing aides. Failed vision test - scheduled to return to see eye doctor 10/2012.  H/o bilat cataracts.  H/o macular edema on left eye  Trouble with vision.  Passed driving test a few years ago.  Denies falls in last year.  Denies sadness, depression, anhedonia.  Preventative: Prostate screening - had screening by [name] about 3 yrs ago.  Normal.  Age out.  Notices some frequency with increased caffeine. Colon cancer screening - done along with EGD.  colon polyp x3 - adenomatous [name] 2003.  Due for rpt.  Would [facility]. Flu shot done 2013 Td 2006 Pneumovax 2004 completed zostavax - discussed.  Has had shingles illness x 2  Caffeine: 4 cups coffee/day, some tea and soda Lives with wife Occupation: Retired Activity: no regular exercise Diet: good water, fruits/vegetables daily  Medications and allergies reviewed and updated in chart.  Past histories reviewed and updated if relevant as below. Patient Active Problem List  Diagnosis  . COLONIC POLYPS  . DIABETES MELLITUS, TYPE II  . HYPERCHOLESTEROLEMIA  . ANXIETY  . ERECTILE DYSFUNCTION  . DEPRESSION  . HYPERTENSION  . CORONARY ARTERY DISEASE  . ATHEROSCLEROTIC CARDIOVASCULAR DISEASE  . ALLERGIC RHINITIS  . GERD  . PSORIASIS  . OSTEOARTHRITIS  . LEG PAIN, RIGHT  . Fatigue  . Healthcare maintenance   Past Medical History  Diagnosis Date  . Arthritis     in lower back  . History of chicken pox   . Depression   . T2DM (type 2 diabetes mellitus) 1995  . GERD (gastroesophageal reflux disease) 2003    h/o duodenal ulcer  per EGD  . Seasonal allergies   . CAD (coronary artery disease) 2004    s/p 4v CABG (Dr. Allyson Sabal)  . HTN (hypertension)   . HLD (hyperlipidemia)   . Colon polyp 2005    (Dr. [name]  . Urge incontinence of urine   . Renal artery stenosis 2004    70% bilateral, followed by cards  . Psoriasis   . History of carotid stenosis   . COPD (chronic obstructive pulmonary disease) 03/2011    by xray  . Carotid stenosis 1999    s/p L CEA   Past Surgical History  Procedure Date  . Tonsillectomy   . Carotid endarterectomy 1999    left [facility])  . Coronary artery bypass graft 2004    4v CABG ([facility]) with LIMA to LAD, vein graft to RCA, 1st obtuse marginal, and ramus intermedius  . Colonoscopy 2003    colon polyp x3 - adenomatous [name]  . Cardiovascular stress test 11/2010    normal perfusion, no evidence of ischemia, EF 62% post exercise  . Upper gastrointestinal endoscopy 2003    reflux esophagitis, erosive gastropathy, duodenal ulcer   History  Substance Use Topics  . Smoking status: Former Smoker -- 0.1 packs/day    Types: Cigarettes    Quit date: 05/04/2012  . Smokeless tobacco: Never Used  . Alcohol Use: No   Family History  Problem Relation Age of Onset  . Cancer Brother 23    lung cancer, non smoker  . Cancer Brother     pancreatic cancer  . Diabetes Sister   . Cancer Mother     GI cancer  . Hypertension Father   . Coronary artery disease Sister   . Stroke Sister    Allergies  Allergen Reactions  . Other     Horse serum  . Rosiglitazone Maleate     REACTION: Did not help  . Tricor (Fenofibrate) Other (See Comments)    myalgias   Current Outpatient Prescriptions on File Prior to Visit  Medication Sig Dispense Refill  . aspirin EC 81 MG tablet Take 81 mg by mouth daily.      . citalopram (CELEXA) 40 MG tablet Take 1 tablet (40 mg total) by mouth daily.  90 tablet  3  . furosemide (LASIX) 40 MG tablet Take 1 tablet (40 mg total) by mouth daily as needed.  30  tablet  0  . insulin glargine (LANTUS) 100 UNIT/ML injection Inject 26 Units into the skin at bedtime.  10 mL  11  . lisinopril (PRINIVIL,ZESTRIL) 40 MG tablet Take 1 tablet (40 mg total) by mouth daily.  90 tablet  3  . metFORMIN (GLUCOPHAGE) 1000 MG tablet Take 1 tablet (1,000 mg total) by mouth as directed. 1 with breakfast, 1/2 with lunch and 1 with supper  225 tablet  3  . metoprolol (LOPRESSOR) 50 MG tablet Take 0.5 tablets (25 mg total) by mouth 2 (two) times daily.  30 tablet  3  . omeprazole (PRILOSEC) 20 MG capsule Take 1 capsule (20 mg total) by mouth daily.  90 capsule  3  . potassium chloride SA (K-DUR,KLOR-CON) 20 MEQ tablet Take 1 tablet (20 mEq total) by mouth daily. When taking furosemide  30 tablet  0  . saw palmetto 160 MG capsule Take 160 mg by mouth daily.      . simvastatin (ZOCOR) 40 MG tablet Take 40 mg by mouth at bedtime.      . traZODone (DESYREL) 50 MG tablet Take 1 tablet (50 mg total) by mouth at bedtime.  90 tablet  3     Review of Systems  Constitutional: Negative for fever, chills, activity change, appetite change, fatigue and unexpected weight change.  HENT: Negative for hearing loss and neck pain.   Eyes: Negative for visual disturbance.  Respiratory: Negative for cough, chest tightness, shortness of breath and wheezing.   Cardiovascular: Negative for chest pain, palpitations and leg swelling.  Gastrointestinal: Negative for nausea, vomiting, abdominal pain, diarrhea, constipation, blood in stool and abdominal distention.  Genitourinary: Negative for hematuria and difficulty urinating.  Musculoskeletal: Negative for myalgias and arthralgias.  Skin: Negative for rash.  Neurological: Negative for dizziness, seizures, syncope and headaches.  Hematological: Does not bruise/bleed easily.  Psychiatric/Behavioral: Negative for dysphoric mood. The patient is not nervous/anxious.        Objective:   Physical Exam  Nursing note and vitals  reviewed. Constitutional: He is oriented to person, place, and time. He appears well-developed and well-nourished. No distress.  HENT:  Head: Normocephalic and atraumatic.  Right Ear: Tympanic membrane, external ear and ear canal normal. Decreased hearing is noted.  Left Ear: Tympanic membrane, external ear and ear canal normal. Decreased hearing is noted.  Nose: Nose normal.  Mouth/Throat: Oropharynx is clear and moist. No oropharyngeal exudate.       Hearing aides in place  Eyes: Conjunctivae normal and  EOM are normal. Pupils are equal, round, and reactive to light. No scleral icterus.  Neck: Normal range of motion. Neck supple. No thyromegaly present.  Cardiovascular: Normal rate, regular rhythm and intact distal pulses.   Murmur (1/6 SEM, faint) heard. Pulses:      Radial pulses are 2+ on the right side, and 2+ on the left side.  Pulmonary/Chest: Effort normal and breath sounds normal. No respiratory distress. He has no wheezes. He has no rales.  Abdominal: Soft. Bowel sounds are normal. He exhibits no distension and no mass. There is no tenderness. There is no rebound and no guarding.  Musculoskeletal: Normal range of motion. He exhibits no edema.  Lymphadenopathy:    He has no cervical adenopathy.  Neurological: He is alert and oriented to person, place, and time.       CN grossly intact, station and gait intact  Skin: Skin is warm and dry. No rash noted.  Psychiatric: He has a normal mood and affect. His behavior is normal. Judgment and thought content normal.       Assessment & Plan:

## 2012-09-15 NOTE — Assessment & Plan Note (Signed)
Chronic. Mildly elevated today.  Continue to monitor  Pt attributes to white coat htn.

## 2012-09-15 NOTE — Assessment & Plan Note (Signed)
Chronic, well controlled as of latest A1c.  Congratulated. rtc 6 mo for f/u.

## 2012-09-15 NOTE — Assessment & Plan Note (Signed)
Chronic. Wonderful control on statin. Continue.

## 2012-09-15 NOTE — Assessment & Plan Note (Signed)
Referred back to Dr. [name] for recall colonoscopy as over due.

## 2012-09-20 ENCOUNTER — Other Ambulatory Visit (HOSPITAL_COMMUNITY): Payer: Self-pay | Admitting: Cardiovascular Disease

## 2012-09-20 DIAGNOSIS — Z9889 Other specified postprocedural states: Secondary | ICD-10-CM

## 2012-10-08 HISTORY — PX: ESOPHAGOGASTRODUODENOSCOPY: SHX1529

## 2012-10-08 HISTORY — PX: COLONOSCOPY: SHX174

## 2012-10-13 ENCOUNTER — Other Ambulatory Visit: Payer: Self-pay | Admitting: *Deleted

## 2012-10-13 MED ORDER — FUROSEMIDE 40 MG PO TABS: 40.0000 mg | ORAL_TABLET | Freq: Every day | ORAL | Status: DC | PRN

## 2012-10-23 ENCOUNTER — Other Ambulatory Visit: Payer: Self-pay | Admitting: *Deleted

## 2012-10-23 MED ORDER — "INSULIN SYRINGE-NEEDLE U-100 30G X 1/2"" 1 ML MISC": 1.0000 | Status: DC

## 2012-11-02 ENCOUNTER — Other Ambulatory Visit (HOSPITAL_COMMUNITY): Payer: Self-pay | Admitting: Cardiovascular Disease

## 2012-11-02 DIAGNOSIS — I2510 Atherosclerotic heart disease of native coronary artery without angina pectoris: Secondary | ICD-10-CM

## 2012-11-28 ENCOUNTER — Ambulatory Visit (HOSPITAL_COMMUNITY)
Admission: RE | Admit: 2012-11-28 | Discharge: 2012-11-28 | Disposition: A | Discharge status: Home / Self Care | Payer: Medicare Other | Source: Ambulatory Visit | Attending: Cardiovascular Disease | Admitting: Cardiovascular Disease

## 2012-11-28 DIAGNOSIS — F17200 Nicotine dependence, unspecified, uncomplicated: Secondary | ICD-10-CM | POA: Insufficient documentation

## 2012-11-28 DIAGNOSIS — I739 Peripheral vascular disease, unspecified: Secondary | ICD-10-CM | POA: Insufficient documentation

## 2012-11-28 DIAGNOSIS — R5383 Other fatigue: Secondary | ICD-10-CM | POA: Insufficient documentation

## 2012-11-28 DIAGNOSIS — I10 Essential (primary) hypertension: Secondary | ICD-10-CM | POA: Insufficient documentation

## 2012-11-28 DIAGNOSIS — I2510 Atherosclerotic heart disease of native coronary artery without angina pectoris: Secondary | ICD-10-CM | POA: Insufficient documentation

## 2012-11-28 DIAGNOSIS — R5381 Other malaise: Secondary | ICD-10-CM | POA: Insufficient documentation

## 2012-11-28 DIAGNOSIS — E119 Type 2 diabetes mellitus without complications: Secondary | ICD-10-CM | POA: Insufficient documentation

## 2012-11-28 HISTORY — PX: CARDIOVASCULAR STRESS TEST: SHX262

## 2012-11-28 MED ORDER — TECHNETIUM TC 99M SESTAMIBI GENERIC - CARDIOLITE
30.0000 | Freq: Once | INTRAVENOUS | Status: AC | PRN
  Administered 2012-11-28: 30 via INTRAVENOUS

## 2012-11-28 MED ORDER — TECHNETIUM TC 99M SESTAMIBI GENERIC - CARDIOLITE
10.0000 | Freq: Once | INTRAVENOUS | Status: AC | PRN
  Administered 2012-11-28: 10 via INTRAVENOUS

## 2012-11-28 MED ORDER — AMINOPHYLLINE 25 MG/ML IV SOLN
75.0000 mg | Freq: Once | INTRAVENOUS | Status: AC
  Administered 2012-11-28: 75 mg via INTRAVENOUS

## 2012-11-28 MED ORDER — REGADENOSON 0.4 MG/5ML IV SOLN
0.4000 mg | Freq: Once | INTRAVENOUS | Status: AC
Start: 2012-11-28 — End: 2012-11-28
  Administered 2012-11-28: 0.4 mg via INTRAVENOUS

## 2012-11-28 NOTE — Procedures (Addendum)
[facility] [address] [place] [address] [phone]  Cardiology Nuclear Med Study  Charles [name] is a 79 y.o. male     MRN : [id number]     DOB: Jul 28, 1932  Procedure Date: 11/28/2012  Nuclear Med Background Indication for Stress Test:  Graft Patency History:  CAD;CABG-X4-03/2003 Cardiac Risk Factors: Carotid Disease, Hypertension, Lipids, NIDDM, Overweight, PVD and Smoker  Symptoms:  Fatigue   Nuclear Pre-Procedure Caffeine/Decaff Intake:  10:00pm NPO After: 8:00am   IV Site: R Antecubital  IV 0.9% NS with Angio Cath:  22g  Chest Size (in):  44" IV Started by: Emmit Pomfret, RN  Height: 6\' 1"  (1.854 m)  Cup Size: n/a  BMI:  Body mass index is 26.13 kg/(m^2). Weight:  198 lb (89.812 kg)   Tech Comments:  N/A    Nuclear Med Study 1 or 2 day study: 1 day  Stress Test Type:  Lexiscan  Order Authorizing Provider:  Obie Dredge   Resting Radionuclide: Technetium 45m Sestamibi  Resting Radionuclide Dose: 10.6 mCi   Stress Radionuclide:  Technetium 17m Sestamibi  Stress Radionuclide Dose: 30.7 mCi           Stress Protocol Rest HR: 55 Stress HR: 74  Rest BP:143/87 Stress BP: 150/77  Exercise Time (min): n/a METS: n/a          Dose of Adenosine (mg):  n/a Dose of Lexiscan: 0.4 mg  Dose of Atropine (mg): n/a Dose of Dobutamine: n/a mcg/kg/min (at max HR)  Stress Test Technologist: Ernestene Mention, CCT Nuclear Technologist: Koren Shiver, CNMT   Rest Procedure:  Myocardial perfusion imaging was performed at rest 45 minutes following the intravenous administration of Technetium 68m Sestamibi. Stress Procedure:  The patient received IV Lexiscan 0.4 mg over 15-seconds.  Technetium 39m Sestamibi injected at 30-seconds.  Due to patient's cough and shortness of breath, he was given IV Aminophylline 75 mg. Symptoms were resolved. There were no significant changes with Lexiscan.  Quantitative spect images were  obtained after a 45 minute delay.  Transient Ischemic Dilatation (Normal <1.22):  1.03 Lung/Heart Ratio (Normal <0.45):  0.27 QGS EDV:  119 ml QGS ESV:  65 ml LV Ejection Fraction: 45%  Signed by     Rest ECG: NSR with poor R wave progression  Stress ECG: No significant change from baseline ECG  QPS Raw Data Images:  Normal; no motion artifact; normal heart/lung ratio. Stress Images:  Mild attenuation inferiorly; otherwise, normal uptake in other areas of the myocardium. Rest Images:  Mild diaphragmatic attenuation; otherwise, normal homogeneous uptake in the myocardium. Subtraction (SDS):  No evidence of ischemia.  Impression Exercise Capacity:  Lexiscan with no exercise. BP Response:  Normal blood pressure response. Clinical Symptoms:  No significant symptoms noted. ECG Impression:  No significant ST segment change suggestive of ischemia. Comparison with Prior Nuclear Study: No significant change from previous study in perfusion, but EF has reduced from 62% to 45%  Overall Impression:  Mild diaphragmatic attenuation; cannot exclude a focal region of nontransmural inferior scar. Low risk stress nuclear study.  LV Wall Motion:  Mild global LV dysfunction with EF 45%.   Lennette Bihari, MD  11/28/2012 2:14 PM

## 2012-12-11 ENCOUNTER — Other Ambulatory Visit: Payer: Self-pay | Admitting: *Deleted

## 2012-12-11 MED ORDER — METOPROLOL TARTRATE 50 MG PO TABS: 25.0000 mg | ORAL_TABLET | Freq: Two times a day (BID) | ORAL | Status: DC

## 2013-01-02 HISTORY — PX: CATARACT EXTRACTION W/ INTRAOCULAR LENS  IMPLANT, BILATERAL: SHX1307

## 2013-02-19 ENCOUNTER — Other Ambulatory Visit: Payer: Self-pay | Admitting: *Deleted

## 2013-02-19 MED ORDER — CITALOPRAM HYDROBROMIDE 40 MG PO TABS: 40.0000 mg | ORAL_TABLET | Freq: Every day | ORAL | Status: DC

## 2013-03-14 ENCOUNTER — Other Ambulatory Visit: Payer: Self-pay | Admitting: *Deleted

## 2013-03-14 MED ORDER — POTASSIUM CHLORIDE CRYS ER 20 MEQ PO TBCR: 20.0000 meq | EXTENDED_RELEASE_TABLET | Freq: Every day | ORAL | Status: DC

## 2013-03-16 ENCOUNTER — Ambulatory Visit: Payer: Medicare Other | Admitting: Family Medicine

## 2013-03-20 ENCOUNTER — Encounter: Payer: Self-pay | Admitting: Family Medicine

## 2013-03-20 ENCOUNTER — Ambulatory Visit (INDEPENDENT_AMBULATORY_CARE_PROVIDER_SITE_OTHER): Payer: Medicare Other | Admitting: Family Medicine

## 2013-03-20 ENCOUNTER — Other Ambulatory Visit: Payer: Self-pay | Admitting: Family Medicine

## 2013-03-20 VITALS — BP 190/90 | HR 64 | Temp 98.3°F | Wt 196.2 lb

## 2013-03-20 DIAGNOSIS — E7800 Pure hypercholesterolemia, unspecified: Secondary | ICD-10-CM

## 2013-03-20 DIAGNOSIS — E119 Type 2 diabetes mellitus without complications: Secondary | ICD-10-CM

## 2013-03-20 DIAGNOSIS — I10 Essential (primary) hypertension: Secondary | ICD-10-CM

## 2013-03-20 LAB — BASIC METABOLIC PANEL
Calcium: 10.2 mg/dL (ref 8.4–10.5)
Creatinine, Ser: 0.9 mg/dL (ref 0.4–1.5)
GFR: 90.07 mL/min (ref >60.00)
Sodium: 143 mEq/L (ref 135–145)

## 2013-03-20 LAB — HEMOGLOBIN A1C: Hgb A1c MFr Bld: 7.2 % — ABNORMAL HIGH (ref 4.6–6.5)

## 2013-03-20 NOTE — Progress Notes (Signed)
Subjective:    Patient ID: Charles [name], male    DOB: 08-Nov-1931, 79 y.o.   MRN: [id number]  HPI CC: 6 mo f/u  Busy last few months. Had lip lesion removed - reassuringly was hair follicles, not cancer. Had cataract extraction bilaterally Brother in law passed away, then nephew passed with cancer, then niece passed with cancer.  brother in law with dementia - trouble finding aide for him. Increased stress recently.   Has not had colonoscopy yet (Dr. [name].  HTN - compliant with meds.  No HA, vision changes, CP/tightness, SOB, minimal leg swelling. States at home running well - 120/70s.  Attributes elevated bp to increased family stress. BP Readings from Last 3 Encounters:  03/20/13 190/80  09/15/12 142/84  05/25/12 130/82    HLD - compliant with simvastatin, no myalgias.  DM - checks sugars bid.  Running fasting 100-140 and PM 100-140.  No paresthesias.  No low sugars or hypoglycemic sxs.  Last saw Dr. Abran Duke May.  Foot exam today.  Has not been watching diet 2/2 increased stress.  Activity - stays active cleaning house, no other regular exercise. Lab Results  Component Value Date   HGBA1C 6.6* 09/04/2012    Some triggering of R ring finger.  Wt Readings from Last 3 Encounters:  03/20/13 196 lb 4 oz (89.018 kg)  11/28/12 198 lb (89.812 kg)  09/15/12 197 lb 12 oz (89.699 kg)    Past Medical History  Diagnosis Date  . Arthritis     in lower back  . History of chicken pox   . Depression   . T2DM (type 2 diabetes mellitus) 1995  . GERD (gastroesophageal reflux disease) 2003    h/o duodenal ulcer per EGD  . Seasonal allergies   . CAD (coronary artery disease) 2004    s/p 4v CABG (Dr. Allyson Sabal)  . HTN (hypertension)   . HLD (hyperlipidemia)   . Colon polyp 2005    (Dr. [name]  . Urge incontinence of urine   . Renal artery stenosis 2004    70% bilateral, followed by cards  . Psoriasis   . History of carotid stenosis   . COPD (chronic obstructive pulmonary disease)  03/2011    by xray  . Carotid stenosis 1999    s/p L CEA    Past Surgical History  Procedure Laterality Date  . Tonsillectomy    . Carotid endarterectomy  1999    left [facility])  . Coronary artery bypass graft  2004    4v CABG ([facility]) with LIMA to LAD, vein graft to RCA, 1st obtuse marginal, and ramus intermedius  . Colonoscopy  2003    colon polyp x3 - adenomatous [name]  . Cardiovascular stress test  11/2010    normal perfusion, no evidence of ischemia, EF 62% post exercise  . Upper gastrointestinal endoscopy  2003    reflux esophagitis, erosive gastropathy, duodenal ulcer     Review of Systems Per HPI    Objective:   Physical Exam  Nursing note and vitals reviewed. Constitutional: He appears well-developed and well-nourished. No distress.  HENT:  Head: Normocephalic and atraumatic.  Mouth/Throat: Oropharynx is clear and moist. No oropharyngeal exudate.  Eyes: Conjunctivae and EOM are normal. Pupils are equal, round, and reactive to light. No scleral icterus.  Neck: Normal range of motion. Neck supple. Carotid bruit is not present.  Cardiovascular: Normal rate, regular rhythm and intact distal pulses.   Murmur (2/6 SEM best at RUSB) heard. Pulmonary/Chest: Breath sounds normal. No  respiratory distress. He has no wheezes. He has no rales.  Musculoskeletal: He exhibits no edema.  Diabetic foot exam: Dry skin throughout mild skin breakdown between 4th/5th digits No calluses  Diminished DP/PT pulses Normal sensation to light touch and monofilament Nails normal  Skin: Skin is warm and dry. No rash noted.       Assessment & Plan:

## 2013-03-20 NOTE — Patient Instructions (Addendum)
Good to see you today. Blood work today.  We will call you with results. Return in 6 months for wellness visit, prior fasting for blood work. Blood pressure rechecked today - staying high. Return this week with your home bp cuff - and we will recheck compared to home cuff.  We will talk after we check home cuff.  If staying elevated, may need to add 3rd medication.  Increase water, decrease sodium content in diet.

## 2013-03-20 NOTE — Assessment & Plan Note (Signed)
Chronic, stable as of last check. Continue meds as up to now. If A1c elevated (suspect), will slowly titrate lantus up. Foot exam today.

## 2013-03-20 NOTE — Assessment & Plan Note (Signed)
Chronic, deteriorated. I've asked him to bring home cuff to compare to ours today or tomorrow - if persistently elevated, will recommend start HCTZ. Pt hesitant to add 3rd antihypertensive to regimen. Discussed avoiding sodium in diet.

## 2013-03-20 NOTE — Assessment & Plan Note (Signed)
Continue statin.  Tolerating well.

## 2013-03-21 ENCOUNTER — Encounter: Payer: Self-pay | Admitting: *Deleted

## 2013-03-22 ENCOUNTER — Other Ambulatory Visit: Payer: Self-pay | Admitting: Family Medicine

## 2013-03-22 MED ORDER — METFORMIN HCL 1000 MG PO TABS: 1000.0000 mg | ORAL_TABLET | ORAL | Status: DC

## 2013-04-19 ENCOUNTER — Other Ambulatory Visit: Payer: Self-pay | Admitting: *Deleted

## 2013-04-19 MED ORDER — OMEPRAZOLE 20 MG PO CPDR: 20.0000 mg | DELAYED_RELEASE_CAPSULE | Freq: Every day | ORAL | Status: DC

## 2013-04-19 MED ORDER — SIMVASTATIN 40 MG PO TABS: 40.0000 mg | ORAL_TABLET | Freq: Every day | ORAL | Status: DC

## 2013-04-19 MED ORDER — TRAZODONE HCL 50 MG PO TABS: 50.0000 mg | ORAL_TABLET | Freq: Every day | ORAL | Status: DC

## 2013-04-19 NOTE — Telephone Encounter (Signed)
Last filled 01/19/13

## 2013-05-21 ENCOUNTER — Other Ambulatory Visit: Payer: Self-pay | Admitting: *Deleted

## 2013-05-21 MED ORDER — LISINOPRIL 40 MG PO TABS: 40.0000 mg | ORAL_TABLET | Freq: Every day | ORAL | Status: DC

## 2013-05-21 MED ORDER — METOPROLOL TARTRATE 50 MG PO TABS: 25.0000 mg | ORAL_TABLET | Freq: Two times a day (BID) | ORAL | Status: DC

## 2013-05-28 ENCOUNTER — Encounter (HOSPITAL_COMMUNITY): Payer: Medicare Other

## 2013-06-11 ENCOUNTER — Encounter (HOSPITAL_COMMUNITY): Payer: Medicare Other

## 2013-06-19 ENCOUNTER — Other Ambulatory Visit: Payer: Self-pay | Admitting: *Deleted

## 2013-06-19 ENCOUNTER — Telehealth: Payer: Self-pay

## 2013-06-19 DIAGNOSIS — I701 Atherosclerosis of renal artery: Secondary | ICD-10-CM

## 2013-06-19 NOTE — Telephone Encounter (Signed)
Spoke with Charles [name] at [address] and pt does not need rx for contour glucometer but does need order for contour next test strips and microlet lancets.Kathie Rhodes, pts wife said pt checks his blood sugar 3 times daily. Dr Reece Agar has not ordered diabetic supplies before.Please advise.

## 2013-06-19 NOTE — Telephone Encounter (Signed)
pts wife is requesting Contour EZ test strips to [facility].

## 2013-06-20 ENCOUNTER — Telehealth: Payer: Self-pay

## 2013-06-20 MED ORDER — GLUCOSE BLOOD VI STRP: ORAL_STRIP | Status: DC

## 2013-06-20 MED ORDER — BAYER MICROLET LANCETS MISC: Status: DC

## 2013-06-20 NOTE — Telephone Encounter (Signed)
pts wife called to see if could get uniformed refills on all meds and 90 day rx on citalopram. Advised citalopram sent to pharmacy on 02/19/2013 for #90 x 3 refills. Pt's wife will ck with pharmacy.

## 2013-06-20 NOTE — Telephone Encounter (Signed)
Patients wife notified

## 2013-06-20 NOTE — Telephone Encounter (Signed)
Sent in.plz notify pt.  

## 2013-06-21 ENCOUNTER — Other Ambulatory Visit: Payer: Self-pay | Admitting: *Deleted

## 2013-06-21 MED ORDER — FUROSEMIDE 40 MG PO TABS: 40.0000 mg | ORAL_TABLET | Freq: Every day | ORAL | Status: DC | PRN

## 2013-06-27 ENCOUNTER — Ambulatory Visit (HOSPITAL_COMMUNITY)
Admission: RE | Admit: 2013-06-27 | Discharge: 2013-06-27 | Disposition: A | Discharge status: Home / Self Care | Payer: Medicare Other | Source: Ambulatory Visit | Attending: Cardiovascular Disease | Admitting: Cardiovascular Disease

## 2013-06-27 DIAGNOSIS — I701 Atherosclerosis of renal artery: Secondary | ICD-10-CM

## 2013-06-27 NOTE — Progress Notes (Signed)
Renal Artery Duplex Completed. °Brianna L Mazza,RVT °

## 2013-07-13 ENCOUNTER — Encounter: Payer: Self-pay | Admitting: *Deleted

## 2013-07-13 ENCOUNTER — Telehealth: Payer: Self-pay | Admitting: *Deleted

## 2013-07-13 DIAGNOSIS — I701 Atherosclerosis of renal artery: Secondary | ICD-10-CM

## 2013-07-13 NOTE — Telephone Encounter (Signed)
Order placed for repeat renal doppler in 1 year 

## 2013-07-13 NOTE — Telephone Encounter (Signed)
Message copied by Marella Bile on Fri Jul 13, 2013  8:38 PM ------      Message from: Runell Gess      Created: Mon Jul 09, 2013  1:32 PM       No change from prior study. Repeat in 12 months. ------

## 2013-08-17 ENCOUNTER — Encounter: Payer: Self-pay | Admitting: Family Medicine

## 2013-08-17 ENCOUNTER — Ambulatory Visit (INDEPENDENT_AMBULATORY_CARE_PROVIDER_SITE_OTHER): Payer: Medicare Other | Admitting: Family Medicine

## 2013-08-17 VITALS — BP 134/82 | HR 64 | Temp 98.1°F | Wt 196.8 lb

## 2013-08-17 DIAGNOSIS — J329 Chronic sinusitis, unspecified: Secondary | ICD-10-CM | POA: Insufficient documentation

## 2013-08-17 MED ORDER — AMOXICILLIN-POT CLAVULANATE 875-125 MG PO TABS: 1.0000 | ORAL_TABLET | Freq: Two times a day (BID) | ORAL | Status: AC

## 2013-08-17 NOTE — Progress Notes (Signed)
Pre-visit discussion using our clinic review tool. No additional management support is needed unless otherwise documented below in the visit note.  

## 2013-08-17 NOTE — Assessment & Plan Note (Addendum)
Given duration, will treat with augmentin course. Update if sxs persist or fail to improve.

## 2013-08-17 NOTE — Patient Instructions (Addendum)
You have a sinus infection. Take medicine as prescribed: augmentin twice daily for 10 days Push water and plenty of rest. Nasal saline irrigation or neti pot to help drain sinuses. May use plain mucinex or immediate release guaifenesin with plenty of fluid to help mobilize mucous. Let us know if fever >101.5, trouble opening/closing mouth, difficulty swallowing, or worsening - you may need to be seen again.

## 2013-08-17 NOTE — Progress Notes (Signed)
  Subjective:    Patient ID: Charles [name], male    DOB: 07-13-1932, 79 y.o.   MRN: [id number]  HPI CC: ?sinus infection  Presents with wife.  Several week to 1 mo h/o sinus congestion, frontal and maxillary sinus pressure headache, yellow discharge from nose.  Worsening over the last 10 days.  Hearing has gotten worse despite hearing aides.  + PNdrainage.  Coughing worse in AM - productive.  Hasn't tried anything other than zyrtec OTC for sxs. No fevers/chills, ear or tooth pain, abd pain, nausea, rashes, ST.  Pt and wife smoke at home.  Pt planning on quitting - states he quit yesterday. No h/o asthma.  + h/o COPD. + wife sick at home recently. H/o PNA x 2  Past Medical History  Diagnosis Date  . Arthritis     in lower back  . History of chicken pox   . Depression   . T2DM (type 2 diabetes mellitus) 1995  . GERD (gastroesophageal reflux disease) 2003    h/o duodenal ulcer per EGD  . Seasonal allergies   . CAD (coronary artery disease) 2004    s/p 4v CABG (Dr. Allyson Sabal)  . HTN (hypertension)   . HLD (hyperlipidemia)   . Colon polyp 2005    (Dr. [name]  . Urge incontinence of urine   . Renal artery stenosis 2004    70% bilateral, followed by cards  . Psoriasis   . History of carotid stenosis   . COPD (chronic obstructive pulmonary disease) 03/2011    by xray  . Carotid stenosis 1999    s/p L CEA     Review of Systems Per HPI    Objective:   Physical Exam  Nursing note and vitals reviewed. Constitutional: He appears well-developed and well-nourished. No distress.  HENT:  Head: Normocephalic and atraumatic.  Right Ear: Hearing, tympanic membrane, external ear and ear canal normal.  Left Ear: Hearing, tympanic membrane, external ear and ear canal normal.  Nose: Mucosal edema present. No rhinorrhea. Right sinus exhibits maxillary sinus tenderness. Right sinus exhibits no frontal sinus tenderness. Left sinus exhibits maxillary sinus tenderness. Left sinus exhibits no  frontal sinus tenderness.  Mouth/Throat: Uvula is midline, oropharynx is clear and moist and mucous membranes are normal. No oropharyngeal exudate, posterior oropharyngeal edema, posterior oropharyngeal erythema or tonsillar abscesses.  Nasal mucosal congestion Mild maxillary sinus pressure to palpation Some PNdrainage present in post oropharynx  Eyes: Conjunctivae and EOM are normal. Pupils are equal, round, and reactive to light. No scleral icterus.  Neck: Normal range of motion. Neck supple.  Cardiovascular: Normal rate, regular rhythm, normal heart sounds and intact distal pulses.   No murmur heard. Pulmonary/Chest: Effort normal and breath sounds normal. No respiratory distress. He has no wheezes. He has no rales.  Lymphadenopathy:    He has no cervical adenopathy.  Skin: Skin is warm and dry. No rash noted.       Assessment & Plan:

## 2013-08-20 ENCOUNTER — Other Ambulatory Visit: Payer: Self-pay | Admitting: *Deleted

## 2013-08-20 MED ORDER — INSULIN GLARGINE 100 UNIT/ML ~~LOC~~ SOLN: 28.0000 [IU] | Freq: Every day | SUBCUTANEOUS | Status: DC

## 2013-08-23 ENCOUNTER — Encounter: Payer: Self-pay | Admitting: Cardiovascular Disease

## 2013-08-23 ENCOUNTER — Ambulatory Visit (INDEPENDENT_AMBULATORY_CARE_PROVIDER_SITE_OTHER): Payer: Medicare Other | Admitting: Cardiovascular Disease

## 2013-08-23 VITALS — BP 150/90 | HR 51 | Ht 73.0 in | Wt 199.0 lb

## 2013-08-23 DIAGNOSIS — I2510 Atherosclerotic heart disease of native coronary artery without angina pectoris: Secondary | ICD-10-CM

## 2013-08-23 DIAGNOSIS — E785 Hyperlipidemia, unspecified: Secondary | ICD-10-CM

## 2013-08-23 DIAGNOSIS — I739 Peripheral vascular disease, unspecified: Secondary | ICD-10-CM

## 2013-08-23 DIAGNOSIS — Z79899 Other long term (current) drug therapy: Secondary | ICD-10-CM

## 2013-08-23 DIAGNOSIS — E7800 Pure hypercholesterolemia, unspecified: Secondary | ICD-10-CM

## 2013-08-23 DIAGNOSIS — I779 Disorder of arteries and arterioles, unspecified: Secondary | ICD-10-CM | POA: Insufficient documentation

## 2013-08-23 DIAGNOSIS — I701 Atherosclerosis of renal artery: Secondary | ICD-10-CM

## 2013-08-23 DIAGNOSIS — I10 Essential (primary) hypertension: Secondary | ICD-10-CM

## 2013-08-23 NOTE — Assessment & Plan Note (Signed)
Well-controlled on current medications 

## 2013-08-23 NOTE — Progress Notes (Signed)
08/23/2013 Charles [name]   [birth year]  [id number]  Primary Physician Charles Boyden, MD Primary Cardiologist: Charles Gess MD Charles [name]   HPI:  The patient is a delightful, 79 year old, mildly overweight, married Caucasian male whose wife is also a patient of mine. He is a father of 3, grandfather to 3 grandchildren. I last saw him a year ago. He has a history of CAD status post coronary artery bypass grafting x4 June 2004 with a LIMA to his LAD; a vein to the right coronary artery, obtuse marginal branch and ramus branch. He also was found to have 70% bilateral renal artery stenosis which we have been following by duplex ultrasound. He has had left carotid endarterectomy which we follow as well. His other problems include hypertension, hyperlipidemia and noninsulin-requiring diabetes. He is asymptomatic. His last functional study performed 2/14 was nonischemic. he denies chest pain or shortness of breath  Current Outpatient Prescriptions  Medication Sig Dispense Refill  . amoxicillin-clavulanate (AUGMENTIN) 875-125 MG per tablet Take 1 tablet by mouth 2 (two) times daily.  20 tablet  0  . aspirin EC 81 MG tablet Take 81 mg by mouth daily.      [name] BAYER MICROLET LANCETS lancets Ck blood sugar three times daily and as directed. Dx 250.00. Insulin dependent  100 each  11  . Cyanocobalamin (B-12) 500 MCG TABS Take 1 tablet by mouth daily.      [name] escitalopram (LEXAPRO) 20 MG tablet Take 20 mg by mouth daily.      . folic acid (FOLVITE) 1 MG tablet Take 1 mg by mouth daily.      . furosemide (LASIX) 40 MG tablet Take 1 tablet (40 mg total) by mouth daily as needed.  30 tablet  3  . glucose blood (BAYER CONTOUR NEXT TEST) test strip Ck blood sugar three times daily and as directed. Dx 250.00 insulin dependent  100 each  11  . insulin glargine (LANTUS) 100 UNIT/ML injection Inject 0.28 mLs (28 Units total) into the skin at bedtime.  10 mL  11  . Insulin Syringe-Needle U-100  (B-D INS SYR ULTRAFINE 1CC/30G) 30G X 1/2" 1 ML MISC 1 Syringe by Does not apply route as directed.  100 each  3  . lisinopril (PRINIVIL,ZESTRIL) 40 MG tablet Take 1 tablet (40 mg total) by mouth daily.  30 tablet  3  . metFORMIN (GLUCOPHAGE) 1000 MG tablet Take 1 tablet (1,000 mg total) by mouth as directed. 1 with breakfast, 1/2 with lunch and 1 with supper  225 tablet  1  . metoprolol (LOPRESSOR) 50 MG tablet Take 0.5 tablets (25 mg total) by mouth 2 (two) times daily.  30 tablet  3  . omeprazole (PRILOSEC) 20 MG capsule Take 1 capsule (20 mg total) by mouth daily.  90 capsule  1  . potassium chloride SA (K-DUR,KLOR-CON) 20 MEQ tablet Take 1 tablet (20 mEq total) by mouth daily. When taking furosemide  30 tablet  3  . saw palmetto 160 MG capsule Take 160 mg by mouth daily.      . simvastatin (ZOCOR) 40 MG tablet Take 1 tablet (40 mg total) by mouth at bedtime.  90 tablet  1  . traZODone (DESYREL) 50 MG tablet Take 1 tablet (50 mg total) by mouth at bedtime.  90 tablet  3  . vitamin C (ASCORBIC ACID) 500 MG tablet Take 500 mg by mouth daily.       No current facility-administered medications for this visit.  Allergies  Allergen Reactions  . Other     Horse serum  . Rosiglitazone Maleate     REACTION: Did not help  . Tricor [Fenofibrate] Other (See Comments)    myalgias    History   Social History  . Marital Status: Married    Spouse Name: N/A    Number of Children: N/A  . Years of Education: N/A   Occupational History  . Not on file.   Social History Main Topics  . Smoking status: Former Smoker -- 0.10 packs/day    Types: Cigarettes    Quit date: 07/23/2013  . Smokeless tobacco: Never Used     Comment: 1/2 ppd  . Alcohol Use: No  . Drug Use: No  . Sexual Activity: Not on file   Other Topics Concern  . Not on file   Social History Narrative   Caffeine: 4 cups coffee/day, some tea and soda   Lives with wife   Occupation: Retired   Activity: no regular exercise    Diet: good water, fruits/vegetables daily     Review of Systems: General: negative for chills, fever, night sweats or weight changes.  Cardiovascular: negative for chest pain, dyspnea on exertion, edema, orthopnea, palpitations, paroxysmal nocturnal dyspnea or shortness of breath Dermatological: negative for rash Respiratory: negative for cough or wheezing Urologic: negative for hematuria Abdominal: negative for nausea, vomiting, diarrhea, bright red blood per rectum, melena, or hematemesis Neurologic: negative for visual changes, syncope, or dizziness All other systems reviewed and are otherwise negative except as noted above.    Blood pressure 150/90, pulse 51, height 6\' 1"  (1.854 m), weight 199 lb (90.266 kg).  General appearance: alert and no distress Neck: no adenopathy, no JVD, supple, symmetrical, trachea midline, thyroid not enlarged, symmetric, no tenderness/mass/nodules and soft right carotid bruit Lungs: clear to auscultation bilaterally Heart: regular rate and rhythm, S1, S2 normal, no murmur, click, rub or gallop Extremities: extremities normal, atraumatic, no cyanosis or edema  EKG sinus bradycardia at 51 with left atrial enlargement and nonspecific ST and T wave changes  ASSESSMENT AND PLAN:   Renal artery stenosis Patient had catheterization documented moderate bilateral renal artery stenosis in 2004 at the time of cath prior to his open heart surgery. We can follow this annually by duplex ultrasound which was recently performed revealed his blockages to be stable with moderate bilateral renal artery stenosis.  Carotid artery disease Status post remote left carotid endarterectomy. We have been following his carotid Doppler by duplex ultrasound.it has been irregular since his last study. I will order a noted carotid Doppler study. He does have a soft right carotid bruit.  HYPERCHOLESTEROLEMIA On statin therapy followed by his PCP. His last lipid profile was in December  of last year. I will reorder a fasting lipid profile.  HYPERTENSION Well-controlled on current medications  CORONARY ARTERY DISEASE Status post coronary artery bypass grafting times 03/08/2003 with a LIMA to his LAD, vein to the right coronary artery, obtuse Burchell branch and ramus branch. He had a Myoview stress test performed in February of this year which was completely normal although he did relate an adverse reaction to the Lavenia Atlas MD [place], [facility] 08/23/2013 11:39 AM

## 2013-08-23 NOTE — Assessment & Plan Note (Signed)
On statin therapy followed by his PCP. His last lipid profile was in December of last year. I will reorder a fasting lipid profile.

## 2013-08-23 NOTE — Assessment & Plan Note (Signed)
Patient had catheterization documented moderate bilateral renal artery stenosis in 2004 at the time of cath prior to his open heart surgery. We can follow this annually by duplex ultrasound which was recently performed revealed his blockages to be stable with moderate bilateral renal artery stenosis.

## 2013-08-23 NOTE — Assessment & Plan Note (Signed)
Status post coronary artery bypass grafting times 03/08/2003 with a LIMA to his LAD, vein to the right coronary artery, obtuse Burchell branch and ramus branch. He had a Myoview stress test performed in February of this year which was completely normal although he did relate an adverse reaction to the [place]

## 2013-08-23 NOTE — Assessment & Plan Note (Signed)
Status post remote left carotid endarterectomy. We have been following his carotid Doppler by duplex ultrasound.it has been irregular since his last study. I will order a noted carotid Doppler study. He does have a soft right carotid bruit.

## 2013-08-23 NOTE — Patient Instructions (Signed)
  We will see you back in follow up in 1 year with Dr Allyson Sabal  Dr Allyson Sabal has ordered carotid dopplers and blood work to be done

## 2013-08-27 ENCOUNTER — Telehealth (HOSPITAL_COMMUNITY): Payer: Self-pay | Admitting: *Deleted

## 2013-08-29 ENCOUNTER — Encounter: Payer: Self-pay | Admitting: Cardiovascular Disease

## 2013-09-03 ENCOUNTER — Encounter: Payer: Self-pay | Admitting: Family Medicine

## 2013-09-03 LAB — HM DIABETES EYE EXAM

## 2013-09-19 ENCOUNTER — Telehealth (HOSPITAL_COMMUNITY): Payer: Self-pay | Admitting: *Deleted

## 2013-09-19 ENCOUNTER — Other Ambulatory Visit: Payer: Self-pay | Admitting: Family Medicine

## 2013-09-19 DIAGNOSIS — E7800 Pure hypercholesterolemia, unspecified: Secondary | ICD-10-CM

## 2013-09-19 DIAGNOSIS — E119 Type 2 diabetes mellitus without complications: Secondary | ICD-10-CM

## 2013-09-20 ENCOUNTER — Telehealth: Payer: Self-pay | Admitting: Family Medicine

## 2013-09-20 NOTE — Telephone Encounter (Signed)
Rx mailed to patient as requested. 

## 2013-09-20 NOTE — Telephone Encounter (Signed)
Needs written order for labs please.

## 2013-09-20 NOTE — Telephone Encounter (Signed)
Patient is scheduled for a physical on 10/02/13.  Patient would like a lab order to take to [facility] before his appointment.  Please mail lab order.

## 2013-09-20 NOTE — Telephone Encounter (Signed)
Printed and placed in Kim's box. 

## 2013-09-21 ENCOUNTER — Other Ambulatory Visit: Payer: Self-pay | Admitting: *Deleted

## 2013-09-21 ENCOUNTER — Telehealth (HOSPITAL_COMMUNITY): Payer: Self-pay | Admitting: *Deleted

## 2013-09-21 MED ORDER — METFORMIN HCL 1000 MG PO TABS: 1000.0000 mg | ORAL_TABLET | ORAL | Status: DC

## 2013-09-24 ENCOUNTER — Other Ambulatory Visit: Payer: Medicare Other

## 2013-10-02 ENCOUNTER — Ambulatory Visit (INDEPENDENT_AMBULATORY_CARE_PROVIDER_SITE_OTHER)
Admission: RE | Admit: 2013-10-02 | Discharge: 2013-10-02 | Disposition: A | Discharge status: Home / Self Care | Payer: Medicare Other | Source: Ambulatory Visit | Attending: Family Medicine | Admitting: Family Medicine

## 2013-10-02 ENCOUNTER — Encounter: Payer: Self-pay | Admitting: Family Medicine

## 2013-10-02 ENCOUNTER — Ambulatory Visit (INDEPENDENT_AMBULATORY_CARE_PROVIDER_SITE_OTHER): Payer: Medicare Other | Admitting: Family Medicine

## 2013-10-02 VITALS — BP 136/84 | HR 60 | Temp 97.6°F | Ht 73.0 in | Wt 198.0 lb

## 2013-10-02 DIAGNOSIS — Z0000 Encounter for general adult medical examination without abnormal findings: Secondary | ICD-10-CM

## 2013-10-02 DIAGNOSIS — M545 Low back pain: Secondary | ICD-10-CM

## 2013-10-02 DIAGNOSIS — M5136 Other intervertebral disc degeneration, lumbar region: Secondary | ICD-10-CM | POA: Insufficient documentation

## 2013-10-02 DIAGNOSIS — E119 Type 2 diabetes mellitus without complications: Secondary | ICD-10-CM

## 2013-10-02 DIAGNOSIS — Z87891 Personal history of nicotine dependence: Secondary | ICD-10-CM

## 2013-10-02 NOTE — Assessment & Plan Note (Signed)
I have personally reviewed the Medicare Annual Wellness questionnaire and have noted 1. The patient's medical and social history 2. Their use of alcohol, tobacco or illicit drugs 3. Their current medications and supplements 4. The patient's functional ability including ADL's, fall risks, home safety risks and hearing or visual impairment. 5. Diet and physical activity 6. Evidence for depression or mood disorders The patients weight, height, BMI have been recorded in the chart.  Hearing and vision has been addressed. I have made referrals, counseling and provided education to the patient based review of the above and I have provided the pt with a written personalized care plan for preventive services. See scanned questionairre. Advanced directives discussed: asked them to review and fill out for next visit.  Reviewed preventative protocols and updated unless pt declined. UTD immunizations, declines zostavax. Colonoscopy/EGD pending next week - h/o polyps.

## 2013-10-02 NOTE — Patient Instructions (Signed)
I think you are doing well today. Xray of lower back today.  Start tylenol 500mg  once to twice daily for lower back pain.  If this doesn't suffice, let me know. Look into advanced directives. Return as needed or in 6 months for diabetes follow up

## 2013-10-02 NOTE — Assessment & Plan Note (Signed)
Congratulated on cessation  

## 2013-10-02 NOTE — Assessment & Plan Note (Signed)
Congratulated on great control on current regimen. Recheck in 6 mo.

## 2013-10-02 NOTE — Progress Notes (Signed)
Pre-visit discussion using our clinic review tool. No additional management support is needed unless otherwise documented below in the visit note.  

## 2013-10-02 NOTE — Assessment & Plan Note (Signed)
Chronic, recently worsening. Check xray as no recent imaging done. Anticipate DDD - recommend start tylenol 500mg  daily to BID and update me if sxs worsen or pain uncontrolled.
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
## 2013-10-03 ENCOUNTER — Telehealth (HOSPITAL_COMMUNITY): Payer: Self-pay | Admitting: *Deleted

## 2013-10-03 ENCOUNTER — Encounter: Payer: Self-pay | Admitting: Family Medicine

## 2013-10-04 DIAGNOSIS — H348392 Tributary (branch) retinal vein occlusion, unspecified eye, stable: Secondary | ICD-10-CM

## 2013-10-04 HISTORY — DX: Tributary (branch) retinal vein occlusion, unspecified eye, stable: H34.8392

## 2013-10-08 ENCOUNTER — Ambulatory Visit: Payer: Self-pay | Admitting: Unknown Physician Specialty

## 2013-10-10 LAB — PATHOLOGY REPORT

## 2013-10-12 ENCOUNTER — Ambulatory Visit (HOSPITAL_COMMUNITY)
Admission: RE | Admit: 2013-10-12 | Discharge: 2013-10-12 | Disposition: A | Discharge status: Home / Self Care | Payer: Medicare Other | Source: Ambulatory Visit | Attending: Cardiovascular Disease | Admitting: Cardiovascular Disease

## 2013-10-12 DIAGNOSIS — I6509 Occlusion and stenosis of unspecified vertebral artery: Secondary | ICD-10-CM | POA: Insufficient documentation

## 2013-10-12 DIAGNOSIS — I739 Peripheral vascular disease, unspecified: Secondary | ICD-10-CM

## 2013-10-12 DIAGNOSIS — I6529 Occlusion and stenosis of unspecified carotid artery: Secondary | ICD-10-CM

## 2013-10-12 NOTE — Progress Notes (Signed)
Carotid Duplex Completed. Mafalda Mcginniss, BS, RDMS, RVT  

## 2013-10-14 ENCOUNTER — Encounter: Payer: Self-pay | Admitting: Family Medicine

## 2013-10-16 ENCOUNTER — Encounter: Payer: Self-pay | Admitting: Family Medicine

## 2013-10-19 ENCOUNTER — Other Ambulatory Visit: Payer: Self-pay | Admitting: Family Medicine

## 2013-10-19 ENCOUNTER — Other Ambulatory Visit: Payer: Self-pay | Admitting: *Deleted

## 2013-10-19 MED ORDER — METOPROLOL TARTRATE 50 MG PO TABS: 25.0000 mg | ORAL_TABLET | Freq: Two times a day (BID) | ORAL | Status: DC

## 2013-10-22 ENCOUNTER — Encounter: Payer: Self-pay | Admitting: Family Medicine

## 2013-11-13 ENCOUNTER — Ambulatory Visit (INDEPENDENT_AMBULATORY_CARE_PROVIDER_SITE_OTHER): Payer: Medicare Other | Admitting: General Practice

## 2013-11-13 ENCOUNTER — Encounter: Payer: Self-pay | Admitting: Internal Medicine

## 2013-11-13 ENCOUNTER — Ambulatory Visit (INDEPENDENT_AMBULATORY_CARE_PROVIDER_SITE_OTHER): Payer: Medicare Other | Admitting: Internal Medicine

## 2013-11-13 ENCOUNTER — Telehealth: Payer: Self-pay | Admitting: Family Medicine

## 2013-11-13 VITALS — BP 144/64 | HR 84 | Temp 97.9°F | Wt 204.2 lb

## 2013-11-13 DIAGNOSIS — I10 Essential (primary) hypertension: Secondary | ICD-10-CM

## 2013-11-13 NOTE — Telephone Encounter (Signed)
This pt was put on [employer] schedule, who is not a physician here.  Pt was moved to [facility] schedule since she had a cancellation.  Glass blower/designer aware.

## 2013-11-13 NOTE — Patient Instructions (Signed)

## 2013-11-13 NOTE — Progress Notes (Signed)
Pre-visit discussion using our clinic review tool. No additional management support is needed unless otherwise documented below in the visit note.  

## 2013-11-13 NOTE — Progress Notes (Signed)
Subjective:    Patient ID: Charles [name], male    DOB: 01-02-1932, 80 y.o.   MRN: [id number]  HPI  Pt presents to the clinic today with c/o HTN. He was told at his last visit that he needed to start monitoring his blood pressure. He did not start monitoring it at that time. He has been taking it over the last 2 days. Her reports it has been 197/125, 189/100, 182/99. He told his eye doctor what his blood pressures have been. His eye doctor advised him to contact our office. Of note, he is on lasix, lisinopril and lopressor. He does have CAD, HTN and RAS.  Review of Systems  Past Medical History  Diagnosis Date  . Arthritis     in lower back  . History of chicken pox   . Depression   . T2DM (type 2 diabetes mellitus) 1995  . GERD (gastroesophageal reflux disease) 2003    h/o duodenal ulcer per EGD as well as esophagitis  . Seasonal allergies   . CAD (coronary artery disease) 2004    s/p 4v CABG (Dr. [name])  . HTN (hypertension)   . HLD (hyperlipidemia)   . Colon polyp 2005    (Dr. Tiffany Kocher)  . Urge incontinence of urine   . Renal artery stenosis 2004    70% bilateral, followed by cards  . Psoriasis   . COPD (chronic obstructive pulmonary disease) 03/2011    by xray  . Carotid stenosis 1999    s/p L CEA  . History of colon polyps 2003    adenomatous  . Ex-smoker   . Compression fracture of L1 lumbar vertebra remote  . DDD (degenerative disc disease), lumbar 2014    Current Outpatient Prescriptions  Medication Sig Dispense Refill  . acetaminophen (TYLENOL) 500 MG tablet Take 500 mg by mouth every 8 (eight) hours as needed.      [name] aspirin EC 81 MG tablet Take 81 mg by mouth daily.      [name] BAYER MICROLET LANCETS lancets Ck blood sugar three times daily and as directed. Dx 250.00. Insulin dependent  100 each  11  . Cyanocobalamin (B-12) 500 MCG TABS Take 1 tablet by mouth daily.      [name] escitalopram (LEXAPRO) 20 MG tablet Take 20 mg by mouth daily.      . folic acid (FOLVITE) 1  MG tablet Take 1 mg by mouth daily.      . furosemide (LASIX) 40 MG tablet Take 1 tablet (40 mg total) by mouth daily as needed.  30 tablet  3  . glucose blood (BAYER CONTOUR NEXT TEST) test strip Ck blood sugar three times daily and as directed. Dx 250.00 insulin dependent  100 each  11  . insulin glargine (LANTUS) 100 UNIT/ML injection Inject 0.28 mLs (28 Units total) into the skin at bedtime.  10 mL  11  . Insulin Syringe-Needle U-100 (B-D INS SYR ULTRAFINE 1CC/30G) 30G X 1/2" 1 ML MISC 1 Syringe by Does not apply route as directed.  100 each  3  . lisinopril (PRINIVIL,ZESTRIL) 40 MG tablet Take 1 tablet (40 mg total) by mouth daily.  30 tablet  3  . metFORMIN (GLUCOPHAGE) 1000 MG tablet Take 1 tablet (1,000 mg total) by mouth as directed. 1 with breakfast, 1/2 with lunch and 1 with supper  225 tablet  0  . metoprolol (LOPRESSOR) 50 MG tablet Take 0.5 tablets (25 mg total) by mouth 2 (two) times daily.  30 tablet  3  . omeprazole (PRILOSEC) 20 MG capsule Take 1 capsule (20 mg total) by mouth daily.  90 capsule  1  . potassium chloride SA (K-DUR,KLOR-CON) 20 MEQ tablet Take 1 tablet (20 mEq total) by mouth daily. When taking furosemide  30 tablet  3  . saw palmetto 160 MG capsule Take 160 mg by mouth daily.      . simvastatin (ZOCOR) 40 MG tablet Take 1 tablet (40 mg total) by mouth at bedtime.  90 tablet  1  . traZODone (DESYREL) 50 MG tablet Take 1 tablet (50 mg total) by mouth at bedtime.  90 tablet  3  . vitamin C (ASCORBIC ACID) 500 MG tablet Take 500 mg by mouth daily.       No current facility-administered medications for this visit.    Allergies  Allergen Reactions  . Other     Horse serum  . Rosiglitazone Maleate     REACTION: Did not help  . Tricor [Fenofibrate] Other (See Comments)    myalgias    Family History  Problem Relation Age of Onset  . Hypertension Father   . Heart disease Father   . Cancer Mother     GI cancer  . Diabetes Sister   . Heart disease Sister   .  Stroke Sister   . Hypertension Sister   . Cancer Brother     Lung cancer - nonsmoker  . Cancer Maternal Grandmother   . Heart attack Maternal Grandfather   . Heart attack Paternal Grandmother   . Heart attack Paternal Grandfather   . Cancer Brother     Pancreatic cancer  . Lung disease Brother     History   Social History  . Marital Status: Married    Spouse Name: N/A    Number of Children: N/A  . Years of Education: N/A   Occupational History  . Not on file.   Social History Main Topics  . Smoking status: Former Smoker -- 0.10 packs/day    Types: Cigarettes    Quit date: 07/23/2013  . Smokeless tobacco: Never Used     Comment: 1/2 ppd  . Alcohol Use: No  . Drug Use: No  . Sexual Activity: Not on file   Other Topics Concern  . Not on file   Social History Narrative   Caffeine: 4 cups coffee/day, some tea and soda   Lives with wife   Occupation: Retired   Activity: no regular exercise   Diet: good water, fruits/vegetables daily     Constitutional: Denies fever, malaise, fatigue, headache or abrupt weight changes.  Respiratory: Denies difficulty breathing, shortness of breath, cough or sputum production.   Cardiovascular: Denies chest pain, chest tightness, palpitations or swelling in the hands or feet.    No other specific complaints in a complete review of systems (except as listed in HPI above).     Objective:   Physical Exam    BP 144/64  Pulse 84  Temp(Src) 97.9 F (36.6 C) (Oral)  Wt 204 lb 4 oz (92.647 kg)  SpO2 95% Wt Readings from Last 3 Encounters:  11/13/13 204 lb 4 oz (92.647 kg)  10/02/13 198 lb (89.812 kg)  08/23/13 199 lb (90.266 kg)    General: Appears his stated age, well developed, well nourished in NAD. Cardiovascular: Normal rate and rhythm. S1,S2 noted.  No murmur, rubs or gallops noted. No JVD or BLE edema. No carotid bruits noted. Pulmonary/Chest: Normal effort and positive vesicular breath sounds. No respiratory distress.  No wheezes, rales or ronchi noted.   BMET    Component Value Date/Time   NA 143 03/20/2013 1243   K 4.4 03/20/2013 1243   CL 103 03/20/2013 1243   CO2 27 03/20/2013 1243   GLUCOSE 134* 03/20/2013 1243   BUN 15 03/20/2013 1243   CREATININE 0.9 03/20/2013 1243   CREATININE 0.8 09/10/2011   CALCIUM 10.2 03/20/2013 1243   GFRNONAA 88 09/21/2007 0945   GFRAA 106 09/21/2007 0945    Lipid Panel     Component Value Date/Time   CHOL 140 09/04/2012 1434   TRIG 52.0 09/04/2012 1434   TRIG 133 09/10/2011   HDL 40.60 09/04/2012 1434   CHOLHDL 3 09/04/2012 1434   VLDL 10.4 09/04/2012 1434   LDLCALC 89 09/04/2012 1434    CBC    Component Value Date/Time   WBC 6.9 09/04/2012 1434   RBC 4.71 09/04/2012 1434   HGB 14.5 09/04/2012 1434   HGB 14.7 09/10/2011   HCT 44.1 09/04/2012 1434   PLT 186.0 09/04/2012 1434   PLT 193 09/10/2011   MCV 93.6 09/04/2012 1434   MCHC 32.9 09/04/2012 1434   RDW 13.3 09/04/2012 1434   LYMPHSABS 1.6 09/04/2012 1434   MONOABS 0.6 09/04/2012 1434   EOSABS 0.4 09/04/2012 1434   BASOSABS 0.1 09/04/2012 1434    Hgb A1C Lab Results  Component Value Date   HGBA1C 7.2* 03/20/2013       Assessment & Plan:   HTN, controlled:  I think it is his blood pressure machine- he needs to get a new one We will make no medication changes at this time  RTC in 1 month to recheck blood pressure

## 2013-11-13 NOTE — Telephone Encounter (Signed)
Patient Information:  Caller Name: Inez Catalina  Phone: [phone]  Patient: Charles [name], Charles [name]  Gender: Male  DOB: 11/05/1931  Age: 80 Years  PCP: [name])  Office Follow Up:  Does the office need to follow up with this patient?: No  Instructions For The Office: N/A   Symptoms  Reason For Call & Symptoms: Pt was seen last month for hypertension. Pt was to keep his BP monitored but he has not done that. This am the pt was at an eye visit with BP of 198/107. This Dr. advised that the pt call his MD after leaving the office.  Reviewed Health History In EMR: Yes  Reviewed Medications In EMR: Yes  Reviewed Allergies In EMR: Yes  Reviewed Surgeries / Procedures: Yes  Date of Onset of Symptoms: 11/13/2013  Guideline(s) Used:  High Blood Pressure  Disposition Per Guideline:   See Today in Office  Reason For Disposition Reached:   BP > 180/110  Advice Given:  N/A  Patient Will Follow Care Advice:  YES  Appointment Scheduled:  11/13/2013 14:30:00 Appointment Scheduled Provider:  Other

## 2013-11-13 NOTE — Progress Notes (Signed)
HPI: Pt presents to the office today for concerns regarding his blood pressure. Pt has been checking his blood pressures at home with his own machine and getting elevated numbers of 190s/100s. Pt denies chest pain, shortness of breath, blurred vision, dizziness, or headache. Wife is at bedside and states that her blood pressure has been elevated at home as well.   Past Medical History  Diagnosis Date  . Arthritis     in lower back  . History of chicken pox   . Depression   . T2DM (type 2 diabetes mellitus) 1995  . GERD (gastroesophageal reflux disease) 2003    h/o duodenal ulcer per EGD as well as esophagitis  . Seasonal allergies   . CAD (coronary artery disease) 2004    s/p 4v CABG (Dr. [name])  . HTN (hypertension)   . HLD (hyperlipidemia)   . Colon polyp 2005    (Dr. Tiffany Kocher)  . Urge incontinence of urine   . Renal artery stenosis 2004    70% bilateral, followed by cards  . Psoriasis   . COPD (chronic obstructive pulmonary disease) 03/2011    by xray  . Carotid stenosis 1999    s/p L CEA  . History of colon polyps 2003    adenomatous  . Ex-smoker   . Compression fracture of L1 lumbar vertebra remote  . DDD (degenerative disc disease), lumbar 2014    Current Outpatient Prescriptions  Medication Sig Dispense Refill  . acetaminophen (TYLENOL) 500 MG tablet Take 500 mg by mouth every 8 (eight) hours as needed.      [name] aspirin EC 81 MG tablet Take 81 mg by mouth daily.      [name] BAYER MICROLET LANCETS lancets Ck blood sugar three times daily and as directed. Dx 250.00. Insulin dependent  100 each  11  . Cyanocobalamin (B-12) 500 MCG TABS Take 1 tablet by mouth daily.      [name] escitalopram (LEXAPRO) 20 MG tablet Take 20 mg by mouth daily.      . folic acid (FOLVITE) 1 MG tablet Take 1 mg by mouth daily.      . furosemide (LASIX) 40 MG tablet Take 1 tablet (40 mg total) by mouth daily as needed.  30 tablet  3  . glucose blood (BAYER CONTOUR NEXT TEST) test strip Ck blood sugar three  times daily and as directed. Dx 250.00 insulin dependent  100 each  11  . insulin glargine (LANTUS) 100 UNIT/ML injection Inject 0.28 mLs (28 Units total) into the skin at bedtime.  10 mL  11  . Insulin Syringe-Needle U-100 (B-D INS SYR ULTRAFINE 1CC/30G) 30G X 1/2" 1 ML MISC 1 Syringe by Does not apply route as directed.  100 each  3  . lisinopril (PRINIVIL,ZESTRIL) 40 MG tablet Take 1 tablet (40 mg total) by mouth daily.  30 tablet  3  . metFORMIN (GLUCOPHAGE) 1000 MG tablet Take 1 tablet (1,000 mg total) by mouth as directed. 1 with breakfast, 1/2 with lunch and 1 with supper  225 tablet  0  . metoprolol (LOPRESSOR) 50 MG tablet Take 0.5 tablets (25 mg total) by mouth 2 (two) times daily.  30 tablet  3  . omeprazole (PRILOSEC) 20 MG capsule Take 1 capsule (20 mg total) by mouth daily.  90 capsule  1  . potassium chloride SA (K-DUR,KLOR-CON) 20 MEQ tablet Take 1 tablet (20 mEq total) by mouth daily. When taking furosemide  30 tablet  3  . saw palmetto 160 MG  capsule Take 160 mg by mouth daily.      . simvastatin (ZOCOR) 40 MG tablet Take 1 tablet (40 mg total) by mouth at bedtime.  90 tablet  1  . traZODone (DESYREL) 50 MG tablet Take 1 tablet (50 mg total) by mouth at bedtime.  90 tablet  3  . vitamin C (ASCORBIC ACID) 500 MG tablet Take 500 mg by mouth daily.       No current facility-administered medications for this visit.    Allergies  Allergen Reactions  . Other     Horse serum  . Rosiglitazone Maleate     REACTION: Did not help  . Tricor [Fenofibrate] Other (See Comments)    myalgias    Family History  Problem Relation Age of Onset  . Hypertension Father   . Heart disease Father   . Cancer Mother     GI cancer  . Diabetes Sister   . Heart disease Sister   . Stroke Sister   . Hypertension Sister   . Cancer Brother     Lung cancer - nonsmoker  . Cancer Maternal Grandmother   . Heart attack Maternal Grandfather   . Heart attack Paternal Grandmother   . Heart attack  Paternal Grandfather   . Cancer Brother     Pancreatic cancer  . Lung disease Brother     History   Social History  . Marital Status: Married    Spouse Name: N/A    Number of Children: N/A  . Years of Education: N/A   Occupational History  . Not on file.   Social History Main Topics  . Smoking status: Former Smoker -- 0.10 packs/day    Types: Cigarettes    Quit date: 07/23/2013  . Smokeless tobacco: Never Used     Comment: 1/2 ppd  . Alcohol Use: No  . Drug Use: No  . Sexual Activity: Not on file   Other Topics Concern  . Not on file   Social History Narrative   Caffeine: 4 cups coffee/day, some tea and soda   Lives with wife   Occupation: Retired   Activity: no regular exercise   Diet: good water, fruits/vegetables daily    ROS:  Constitutional: Denies fever, malaise, fatigue, headache or abrupt weight changes.  Respiratory: Denies difficulty breathing, shortness of breath, cough or sputum production.   Cardiovascular: Denies chest pain, chest tightness, palpitations or swelling in the hands or feet.  Neurological: Denies dizziness, difficulty with memory, difficulty with speech or problems with balance and coordination.   No other specific complaints in a complete review of systems (except as listed in HPI above).  PE:  BP 144/64  Pulse 84  Temp(Src) 97.9 F (36.6 C) (Oral)  Wt 204 lb 4 oz (92.647 kg)  SpO2 95% Wt Readings from Last 3 Encounters:  11/13/13 204 lb 4 oz (92.647 kg)  10/02/13 198 lb (89.812 kg)  08/23/13 199 lb (90.266 kg)    General: Appears their stated age, well developed, well nourished in NAD. HEENT: Head: normal shape and size; Eyes: sclera white, no icterus, conjunctiva pink, PERRLA and EOMs intact; .  Cardiovascular: Normal rate and rhythm. S1,S2 noted.  No murmur, rubs or gallops noted. No JVD or BLE edema. No carotid bruits noted. Pulmonary/Chest: Normal effort and positive vesicular breath sounds. No respiratory distress. No  wheezes, rales or ronchi noted.  Neurological: Alert and oriented.  Coordination normal.  Psychiatric: Mood and affect normal. Behavior is normal. Judgment and thought content normal.  Assessment and Plan: Hypertension: Manual Blood pressure stable at visit: 144/64 Continue current medications as prescribed Recommended new blood pressure machine for at home use Follow up if you notice any new symptoms  Charles [name], Demetrius Charity, Student-NP

## 2013-11-21 ENCOUNTER — Other Ambulatory Visit: Payer: Self-pay | Admitting: *Deleted

## 2013-11-21 MED ORDER — "INSULIN SYRINGE-NEEDLE U-100 30G X 1/2"" 1 ML MISC": 1.0000 | Status: DC

## 2013-12-20 ENCOUNTER — Other Ambulatory Visit: Payer: Self-pay | Admitting: *Deleted

## 2013-12-20 MED ORDER — LISINOPRIL 40 MG PO TABS: 40.0000 mg | ORAL_TABLET | Freq: Every day | ORAL | Status: DC

## 2014-01-18 ENCOUNTER — Other Ambulatory Visit: Payer: Self-pay | Admitting: *Deleted

## 2014-01-18 MED ORDER — OMEPRAZOLE 20 MG PO CPDR: 20.0000 mg | DELAYED_RELEASE_CAPSULE | Freq: Every day | ORAL | Status: DC

## 2014-02-15 ENCOUNTER — Encounter: Payer: Self-pay | Admitting: Family Medicine

## 2014-02-18 ENCOUNTER — Other Ambulatory Visit: Payer: Self-pay | Admitting: *Deleted

## 2014-02-18 MED ORDER — SIMVASTATIN 40 MG PO TABS
40.0000 mg | ORAL_TABLET | Freq: Every day | ORAL | Status: DC
Start: 2014-02-18 — End: 2014-10-08

## 2014-03-25 ENCOUNTER — Other Ambulatory Visit: Payer: Self-pay | Admitting: *Deleted

## 2014-03-25 MED ORDER — METOPROLOL TARTRATE 50 MG PO TABS: 25.0000 mg | ORAL_TABLET | Freq: Two times a day (BID) | ORAL | Status: DC

## 2014-04-02 ENCOUNTER — Ambulatory Visit (INDEPENDENT_AMBULATORY_CARE_PROVIDER_SITE_OTHER): Payer: Medicare Other | Admitting: Family Medicine

## 2014-04-02 ENCOUNTER — Encounter: Payer: Self-pay | Admitting: Family Medicine

## 2014-04-02 VITALS — BP 150/80 | HR 84 | Temp 98.3°F | Wt 199.5 lb

## 2014-04-02 DIAGNOSIS — R5381 Other malaise: Secondary | ICD-10-CM

## 2014-04-02 DIAGNOSIS — E119 Type 2 diabetes mellitus without complications: Secondary | ICD-10-CM

## 2014-04-02 DIAGNOSIS — R5383 Other fatigue: Secondary | ICD-10-CM

## 2014-04-02 DIAGNOSIS — R531 Weakness: Secondary | ICD-10-CM | POA: Insufficient documentation

## 2014-04-02 DIAGNOSIS — I10 Essential (primary) hypertension: Secondary | ICD-10-CM

## 2014-04-02 DIAGNOSIS — Z23 Encounter for immunization: Secondary | ICD-10-CM

## 2014-04-02 LAB — CBC WITH DIFFERENTIAL/PLATELET
BASOS ABS: 0.1 10*3/uL (ref 0.0–0.1)
Basophils Relative: 1.5 % (ref 0.0–3.0)
Eosinophils Absolute: 0.5 10*3/uL (ref 0.0–0.7)
Eosinophils Relative: 7 % — ABNORMAL HIGH (ref 0.0–5.0)
HCT: 43.9 % (ref 39.0–52.0)
Hemoglobin: 14.7 g/dL (ref 13.0–17.0)
LYMPHS PCT: 21.2 % (ref 12.0–46.0)
Lymphs Abs: 1.4 10*3/uL (ref 0.7–4.0)
MCHC: 33.5 g/dL (ref 30.0–36.0)
MCV: 92.4 fl (ref 78.0–100.0)
MONOS PCT: 9.4 % (ref 3.0–12.0)
Monocytes Absolute: 0.6 10*3/uL (ref 0.1–1.0)
Neutro Abs: 4.1 10*3/uL (ref 1.4–7.7)
Neutrophils Relative %: 60.9 % (ref 43.0–77.0)
PLATELETS: 178 10*3/uL (ref 150.0–400.0)
RBC: 4.75 Mil/uL (ref 4.22–5.81)
RDW: 13.9 % (ref 11.5–15.5)
WBC: 6.7 10*3/uL (ref 4.0–10.5)

## 2014-04-02 LAB — BASIC METABOLIC PANEL
BUN: 16 mg/dL (ref 6–23)
CALCIUM: 9.7 mg/dL (ref 8.4–10.5)
CHLORIDE: 102 meq/L (ref 96–112)
CO2: 29 meq/L (ref 19–32)
Creatinine, Ser: 0.8 mg/dL (ref 0.4–1.5)
GFR: 105 mL/min (ref >60.00)
Glucose, Bld: 61 mg/dL — ABNORMAL LOW (ref 70–99)
POTASSIUM: 4.5 meq/L (ref 3.5–5.1)
SODIUM: 138 meq/L (ref 135–145)

## 2014-04-02 LAB — TSH: TSH: 0.47 u[IU]/mL (ref 0.35–4.50)

## 2014-04-02 LAB — HEMOGLOBIN A1C: HEMOGLOBIN A1C: 7.3 % — AB (ref 4.6–6.5)

## 2014-04-02 LAB — VITAMIN B12: Vitamin B-12: 408 pg/mL (ref 211–911)

## 2014-04-02 NOTE — Assessment & Plan Note (Signed)
Check for reversible causes. Anticipate deconditioning related, encouraged continue slow increase in aerobic exercise on treadmill.

## 2014-04-02 NOTE — Assessment & Plan Note (Signed)
Chronic, stable. Adequate control for patient given age. Check A1c today.

## 2014-04-02 NOTE — Addendum Note (Signed)
Addended by: Royann Shivers A on: 04/02/2014 09:36 AM   Modules accepted: Orders

## 2014-04-02 NOTE — Progress Notes (Signed)
Pre visit review using our clinic review tool, if applicable. No additional management support is needed unless otherwise documented below in the visit note. 

## 2014-04-02 NOTE — Assessment & Plan Note (Signed)
Chronic, stable. I've asked him to monitor BP at home with new BP cuff and notify me if persistently elevated readings for med change as indicated.

## 2014-04-02 NOTE — Patient Instructions (Signed)
Blood pressure a bit elevated today - start checking at home with new bp cuff and let me know if consistently >140/90 for a change in meds. Prevnar today. Blood work today to check diabetes Good to see you today, call us with questions. Return in 6 months for medicare wellness visit.

## 2014-04-02 NOTE — Progress Notes (Signed)
BP 150/80  Pulse 84  Temp(Src) 98.3 F (36.8 C) (Oral)  Wt 199 lb 8 oz (90.493 kg)   CC: 6 mo f/u  Subjective:    Patient ID: Charles [name], male    DOB: Feb 22, 1932, 80 y.o.   MRN: [id number]  HPI: BROUGHTON EPPINGER is a 80 y.o. male presenting on 04/02/2014 for Follow-up   HTN - Compliant with current antihypertensive regimen of metoprolol 25mg  bid, lisinopril 40mg  daily and lasix 40mg  daily prn.  Does note check blood pressures at home.  No low blood pressure readings or symptoms of dizziness/syncope.  Denies HA, vision changes, CP/tightness, SOB, leg swelling.   DM - regularly does check sugars three times daily, running slightly elevated attributed to stress.  Compliant with antihyperglycemic regimen which includes: metformin 2500mg  daily and lantus 28 u at bedtime.  Denies low sugars or hypoglycemic symptoms.  Denies paresthesias. Last diabetic eye exam 09/2013.  Pneumovax: 2004.  Prevnar: today.  Lab Results  Component Value Date   HGBA1C 7.2* 03/20/2013    Fatigue - low energy. Noticing more of an issue over last 6 months. Yesterday walked some on treadmill. 1/4 mile in 10 min. Denies trouble sleeping, does snore, denies apneic episodes. No PNdyspnea. Slightly dizzy this morning.  Relevant past medical, surgical, family and social history reviewed and updated as indicated.  Allergies and medications reviewed and updated. Current Outpatient Prescriptions on File Prior to Visit  Medication Sig  . acetaminophen (TYLENOL) 500 MG tablet Take 500 mg by mouth every 8 (eight) hours as needed.  [name] aspirin EC 81 MG tablet Take 81 mg by mouth daily.  [name] BAYER MICROLET LANCETS lancets Ck blood sugar three times daily and as directed. Dx 250.00. Insulin dependent  . Cyanocobalamin (B-12) 500 MCG TABS Take 1 tablet by mouth daily.  [name] escitalopram (LEXAPRO) 20 MG tablet Take 20 mg by mouth daily.  . folic acid (FOLVITE) 1 MG tablet Take 1 mg by mouth daily.  . furosemide (LASIX) 40 MG  tablet Take 1 tablet (40 mg total) by mouth daily as needed.  [name] glucose blood (BAYER CONTOUR NEXT TEST) test strip Ck blood sugar three times daily and as directed. Dx 250.00 insulin dependent  . insulin glargine (LANTUS) 100 UNIT/ML injection Inject 0.28 mLs (28 Units total) into the skin at bedtime.  . Insulin Syringe-Needle U-100 (B-D INS SYR ULTRAFINE 1CC/30G) 30G X 1/2" 1 ML MISC 1 Syringe by Does not apply route as directed.  [name] lisinopril (PRINIVIL,ZESTRIL) 40 MG tablet Take 1 tablet (40 mg total) by mouth daily.  . metFORMIN (GLUCOPHAGE) 1000 MG tablet Take 1 tablet (1,000 mg total) by mouth as directed. 1 with breakfast, 1/2 with lunch and 1 with supper  . metoprolol (LOPRESSOR) 50 MG tablet Take 0.5 tablets (25 mg total) by mouth 2 (two) times daily.  [name] omeprazole (PRILOSEC) 20 MG capsule Take 1 capsule (20 mg total) by mouth daily.  . potassium chloride SA (K-DUR,KLOR-CON) 20 MEQ tablet Take 1 tablet (20 mEq total) by mouth daily. When taking furosemide  . saw palmetto 160 MG capsule Take 160 mg by mouth daily.  . simvastatin (ZOCOR) 40 MG tablet Take 1 tablet (40 mg total) by mouth at bedtime.  . traZODone (DESYREL) 50 MG tablet Take 1 tablet (50 mg total) by mouth at bedtime.  . vitamin C (ASCORBIC ACID) 500 MG tablet Take 500 mg by mouth daily.   No current facility-administered medications on file prior to visit.  Review of Systems Per HPI unless specifically indicated above    Objective:    BP 150/80  Pulse 84  Temp(Src) 98.3 F (36.8 C) (Oral)  Wt 199 lb 8 oz (90.493 kg)  Physical Exam  Nursing note and vitals reviewed. Constitutional: He appears well-developed and well-nourished. No distress.  HENT:  Head: Normocephalic and atraumatic.  Right Ear: Hearing, tympanic membrane, external ear and ear canal normal.  Left Ear: Hearing, tympanic membrane, external ear and ear canal normal.  Nose: Nose normal. No mucosal edema or rhinorrhea. Right sinus exhibits no  maxillary sinus tenderness and no frontal sinus tenderness. Left sinus exhibits no maxillary sinus tenderness and no frontal sinus tenderness.  Mouth/Throat: Uvula is midline, oropharynx is clear and moist and mucous membranes are normal. No oropharyngeal exudate, posterior oropharyngeal edema, posterior oropharyngeal erythema or tonsillar abscesses.  White PNdrainage  Eyes: Conjunctivae and EOM are normal. Pupils are equal, round, and reactive to light. No scleral icterus.  Neck: Normal range of motion. Neck supple.  Cardiovascular: Normal rate, regular rhythm, normal heart sounds and intact distal pulses.   No murmur heard. Pulmonary/Chest: Effort normal and breath sounds normal. No respiratory distress. He has no wheezes. He has no rales.  Musculoskeletal: He exhibits no edema.  Diabetic foot exam: Normal inspection No skin breakdown No calluses  Normal DP pulses Normal sensation to light touch and monofilament Nails normal  Lymphadenopathy:    He has no cervical adenopathy.  Skin: Skin is warm and dry. No rash noted.   Results for orders placed in visit on 10/02/13  HM DIABETES EYE EXAM      Result Value Ref Range   HM Diabetic Eye Exam [place]        Assessment & Plan:   Problem List Items Addressed This Visit   Other fatigue     Check for reversible causes. Anticipate deconditioning related, encouraged continue slow increase in aerobic exercise on treadmill.    Relevant Orders      CBC with Differential      Vitamin B12   HYPERTENSION     Chronic, stable. I've asked him to monitor BP at home with new BP cuff and notify me if persistently elevated readings for med change as indicated.    Relevant Orders      TSH      Basic metabolic panel   DIABETES MELLITUS, TYPE II - Primary     Chronic, stable. Adequate control for patient given age. Check A1c today.    Relevant Orders      HM DIABETES FOOT EXAM (Completed)      Hemoglobin A1c       Follow up plan: Return in  about 6 months (around 10/02/2014), or if symptoms worsen or fail to improve, for annual exam, prior fasting for blood work.

## 2014-04-03 ENCOUNTER — Encounter: Payer: Self-pay | Admitting: *Deleted

## 2014-05-22 ENCOUNTER — Telehealth: Payer: Self-pay | Admitting: *Deleted

## 2014-05-22 ENCOUNTER — Other Ambulatory Visit: Payer: Self-pay | Admitting: *Deleted

## 2014-05-22 MED ORDER — CITALOPRAM HYDROBROMIDE 20 MG PO TABS
20.0000 mg | ORAL_TABLET | Freq: Every day | ORAL | Status: DC
Start: 2014-05-22 — End: 2014-10-07

## 2014-05-22 MED ORDER — METOPROLOL TARTRATE 50 MG PO TABS: 25.0000 mg | ORAL_TABLET | Freq: Two times a day (BID) | ORAL | Status: DC

## 2014-05-22 MED ORDER — LISINOPRIL 40 MG PO TABS: 40.0000 mg | ORAL_TABLET | Freq: Every day | ORAL | Status: DC

## 2014-05-22 NOTE — Telephone Encounter (Signed)
Message left notifying patient.

## 2014-05-22 NOTE — Telephone Encounter (Signed)
Received refill request for citalopram 40 mg 1 QD. Med list says Lexapro 20 mg QD. I spoke with patient who confirmed he is taking citalopram and has been for years. He isn't sure how lexapro even got on his list or how the citalopram got d/c'd. (It looks like it was stopped by Dr. Kennon Holter office-but never mentioned in his note). Ok to refill citalopram # 90?

## 2014-05-22 NOTE — Telephone Encounter (Signed)
Sent in. Recommend max citalopram 20mg  daily given omeprazole use and drug interactions. Sent this dose in.

## 2014-06-04 ENCOUNTER — Telehealth (HOSPITAL_COMMUNITY): Payer: Self-pay | Admitting: *Deleted

## 2014-06-04 DIAGNOSIS — I350 Nonrheumatic aortic (valve) stenosis: Secondary | ICD-10-CM | POA: Insufficient documentation

## 2014-07-16 ENCOUNTER — Ambulatory Visit (HOSPITAL_COMMUNITY)
Admission: RE | Admit: 2014-07-16 | Discharge: 2014-07-16 | Disposition: A | Discharge status: Home / Self Care | Payer: Medicare Other | Source: Ambulatory Visit | Attending: Cardiology | Admitting: Cardiology

## 2014-07-16 DIAGNOSIS — I701 Atherosclerosis of renal artery: Secondary | ICD-10-CM | POA: Diagnosis not present

## 2014-07-16 NOTE — Progress Notes (Signed)
Renal Duplex Completed. Daziah Hesler, BS, RDMS, RVT  

## 2014-07-23 ENCOUNTER — Other Ambulatory Visit: Payer: Self-pay | Admitting: *Deleted

## 2014-07-23 MED ORDER — TRAZODONE HCL 50 MG PO TABS: 50.0000 mg | ORAL_TABLET | Freq: Every day | ORAL | Status: DC

## 2014-07-23 NOTE — Telephone Encounter (Signed)
Ok to refill 

## 2014-07-30 ENCOUNTER — Encounter: Payer: Self-pay | Admitting: *Deleted

## 2014-09-16 ENCOUNTER — Encounter: Payer: Medicare Other | Admitting: Family Medicine

## 2014-10-07 ENCOUNTER — Other Ambulatory Visit: Payer: Self-pay | Admitting: *Deleted

## 2014-10-07 DIAGNOSIS — E119 Type 2 diabetes mellitus without complications: Secondary | ICD-10-CM | POA: Diagnosis not present

## 2014-10-07 DIAGNOSIS — Z794 Long term (current) use of insulin: Secondary | ICD-10-CM | POA: Diagnosis not present

## 2014-10-07 MED ORDER — FUROSEMIDE 40 MG PO TABS: 40.0000 mg | ORAL_TABLET | Freq: Every day | ORAL | Status: DC | PRN

## 2014-10-07 MED ORDER — CITALOPRAM HYDROBROMIDE 20 MG PO TABS: 20.0000 mg | ORAL_TABLET | Freq: Every day | ORAL | Status: DC

## 2014-10-08 ENCOUNTER — Encounter: Payer: Self-pay | Admitting: Family Medicine

## 2014-10-08 ENCOUNTER — Ambulatory Visit (INDEPENDENT_AMBULATORY_CARE_PROVIDER_SITE_OTHER): Payer: Medicare Other | Admitting: Family Medicine

## 2014-10-08 VITALS — BP 144/78 | HR 60 | Temp 97.8°F | Ht 73.0 in | Wt 197.0 lb

## 2014-10-08 DIAGNOSIS — Z7189 Other specified counseling: Secondary | ICD-10-CM | POA: Insufficient documentation

## 2014-10-08 DIAGNOSIS — E7800 Pure hypercholesterolemia, unspecified: Secondary | ICD-10-CM

## 2014-10-08 DIAGNOSIS — I10 Essential (primary) hypertension: Secondary | ICD-10-CM | POA: Diagnosis not present

## 2014-10-08 DIAGNOSIS — I701 Atherosclerosis of renal artery: Secondary | ICD-10-CM

## 2014-10-08 DIAGNOSIS — E118 Type 2 diabetes mellitus with unspecified complications: Secondary | ICD-10-CM

## 2014-10-08 DIAGNOSIS — Z0000 Encounter for general adult medical examination without abnormal findings: Secondary | ICD-10-CM | POA: Diagnosis not present

## 2014-10-08 LAB — COMPREHENSIVE METABOLIC PANEL
ALT: 17 U/L (ref 0–53)
AST: 19 U/L (ref 0–37)
Albumin: 4.1 g/dL (ref 3.5–5.2)
Alkaline Phosphatase: 54 U/L (ref 39–117)
BUN: 16 mg/dL (ref 6–23)
CO2: 30 mEq/L (ref 19–32)
Calcium: 9.5 mg/dL (ref 8.4–10.5)
Chloride: 104 mEq/L (ref 96–112)
Creatinine, Ser: 0.8 mg/dL (ref 0.4–1.5)
GFR: 101.76 mL/min (ref >60.00)
GLUCOSE: 152 mg/dL — AB (ref 70–99)
POTASSIUM: 5.1 meq/L (ref 3.5–5.1)
Sodium: 139 mEq/L (ref 135–145)
Total Bilirubin: 0.7 mg/dL (ref 0.2–1.2)
Total Protein: 7.1 g/dL (ref 6.0–8.3)

## 2014-10-08 LAB — MICROALBUMIN / CREATININE URINE RATIO
CREATININE, U: 187.2 mg/dL
MICROALB/CREAT RATIO: 3 mg/g (ref 0.0–30.0)
Microalb, Ur: 5.6 mg/dL — ABNORMAL HIGH (ref 0.0–1.9)

## 2014-10-08 LAB — LIPID PANEL
CHOL/HDL RATIO: 5
Cholesterol: 148 mg/dL (ref 0–200)
HDL: 31.4 mg/dL — ABNORMAL LOW (ref >39.00)
LDL Cholesterol: 85 mg/dL (ref 0–99)
NonHDL: 116.6
TRIGLYCERIDES: 156 mg/dL — AB (ref 0.0–149.0)
VLDL: 31.2 mg/dL (ref 0.0–40.0)

## 2014-10-08 LAB — HEMOGLOBIN A1C: Hgb A1c MFr Bld: 7.2 % — ABNORMAL HIGH (ref 4.6–6.5)

## 2014-10-08 NOTE — Assessment & Plan Note (Signed)

## 2014-10-08 NOTE — Addendum Note (Signed)
Addended by: Marchia Bond on: 10/08/2014 02:39 PM   Modules accepted: Miquel Dunn

## 2014-10-08 NOTE — Assessment & Plan Note (Signed)
Check A1c today.

## 2014-10-08 NOTE — Assessment & Plan Note (Signed)
Stable by annual imaging by cards.

## 2014-10-08 NOTE — Assessment & Plan Note (Signed)
Advanced directives: has forms, has not filled out. Unsure about HCPOA. Doesn't want prolonged life support.

## 2014-10-08 NOTE — Patient Instructions (Addendum)
Advanced directive packet provided today x2.  Blood work today. Good to see you today, call us with questions. Return as needed or in 6 months for follow up diabetes.

## 2014-10-08 NOTE — Assessment & Plan Note (Signed)
Check FLP today. 

## 2014-10-08 NOTE — Progress Notes (Signed)
BP 144/78 mmHg  Pulse 60  Temp(Src) 97.8 F (36.6 C) (Oral)  Ht 6\' 1"  (1.854 m)  Wt 197 lb (89.359 kg)  BMI 26.00 kg/m2   CC: medicare wellness visit  Subjective:    Patient ID: Charles [name], male    DOB: December 30, 1931, 81 y.o.   MRN: [id number]  HPI: Charles [name] is a 81 y.o. male presenting on 10/08/2014 for Annual Exam    Hearing screen not performed as he wears hearing aides. Vision screen - sees Dr Bing Plume Q6 mo. Known L vision loss, h/o macular edema remotely. Denies falls in last year.  Denies sadness, depression, anhedonia.  Preventative: Prostate screening - had screening by [facility] Yves Dill) about 2011. Normal. Age out. Notices some frequency with increased caffeine. COLONOSCOPY Date: 10/08/2012 2 TA, diverticulosis, int hem, no rpt rec Tiffany Kocher)  ESOPHAGOGASTRODUODENOSCOPY Date: 1/5/204 nl esophagus, duodenitis and erosive gastropathy, path - gastropathy no Hpylori, no rpt rec Flu shot done 08/2013 Td 2006. Pneumovax 2004 completed, prevnar 2015  zostavax - discussed. Has had shingles illness x 2. Not interested. Advanced directives: has forms, has not filled out. Unsure about HCPOA. Doesn't want prolonged life support.  Caffeine: 4 cups coffee/day, some tea and soda  Lives with wife  Occupation: Retired  Activity: no regular exercise  Diet: good water, fruits/vegetables daily  Relevant past medical, surgical, family and social history reviewed and updated as indicated. Interim medical history since our last visit reviewed. Allergies and medications reviewed and updated.  Current Outpatient Prescriptions on File Prior to Visit  Medication Sig  . acetaminophen (TYLENOL) 500 MG tablet Take 500 mg by mouth every 8 (eight) hours as needed.  [name] aspirin EC 81 MG tablet Take 81 mg by mouth daily.  [name] BAYER MICROLET LANCETS lancets Ck blood sugar three times daily and as directed. Dx 250.00. Insulin dependent  . citalopram (CELEXA) 20 MG tablet Take 1 tablet (20 mg  total) by mouth daily.  . Cyanocobalamin (B-12) 500 MCG TABS Take 1 tablet by mouth daily.  . folic acid (FOLVITE) 1 MG tablet Take 1 mg by mouth daily.  . furosemide (LASIX) 40 MG tablet Take 1 tablet (40 mg total) by mouth daily as needed.  [name] glucose blood (BAYER CONTOUR NEXT TEST) test strip Ck blood sugar three times daily and as directed. Dx 250.00 insulin dependent  . insulin glargine (LANTUS) 100 UNIT/ML injection Inject 0.28 mLs (28 Units total) into the skin at bedtime.  . Insulin Syringe-Needle U-100 (B-D INS SYR ULTRAFINE 1CC/30G) 30G X 1/2" 1 ML MISC 1 Syringe by Does not apply route as directed.  [name] lisinopril (PRINIVIL,ZESTRIL) 40 MG tablet Take 1 tablet (40 mg total) by mouth daily.  . metFORMIN (GLUCOPHAGE) 1000 MG tablet Take 1 tablet (1,000 mg total) by mouth as directed. 1 with breakfast, 1/2 with lunch and 1 with supper  . metoprolol (LOPRESSOR) 50 MG tablet Take 0.5 tablets (25 mg total) by mouth 2 (two) times daily.  [name] omeprazole (PRILOSEC) 20 MG capsule Take 1 capsule (20 mg total) by mouth daily.  . potassium chloride SA (K-DUR,KLOR-CON) 20 MEQ tablet Take 1 tablet (20 mEq total) by mouth daily. When taking furosemide  . saw palmetto 160 MG capsule Take 450 mg by mouth daily.   . traZODone (DESYREL) 50 MG tablet Take 1 tablet (50 mg total) by mouth at bedtime.  . vitamin C (ASCORBIC ACID) 500 MG tablet Take 500 mg by mouth daily.   No current facility-administered medications on  file prior to visit.    Review of Systems  Constitutional: Negative for fever, chills, activity change, appetite change, fatigue and unexpected weight change.  HENT: Negative for hearing loss.   Eyes: Negative for visual disturbance.  Respiratory: Negative for cough, chest tightness, shortness of breath and wheezing.   Cardiovascular: Negative for chest pain, palpitations and leg swelling.  Gastrointestinal: Negative for nausea, vomiting, abdominal pain, diarrhea, constipation, blood in stool  and abdominal distention.  Genitourinary: Negative for hematuria and difficulty urinating.  Musculoskeletal: Negative for myalgias, arthralgias and neck pain.  Skin: Negative for rash.  Neurological: Negative for dizziness, seizures, syncope and headaches.  Hematological: Negative for adenopathy. Does not bruise/bleed easily.  Psychiatric/Behavioral: Negative for dysphoric mood. The patient is not nervous/anxious.    Per HPI unless specifically indicated above     Objective:    BP 144/78 mmHg  Pulse 60  Temp(Src) 97.8 F (36.6 C) (Oral)  Ht 6\' 1"  (1.854 m)  Wt 197 lb (89.359 kg)  BMI 26.00 kg/m2  Wt Readings from Last 3 Encounters:  10/08/14 197 lb (89.359 kg)  04/02/14 199 lb 8 oz (90.493 kg)  11/13/13 204 lb 4 oz (92.647 kg)    Physical Exam  Constitutional: He is oriented to person, place, and time. He appears well-developed and well-nourished. No distress.  HENT:  Head: Normocephalic and atraumatic.  Right Ear: Hearing, tympanic membrane, external ear and ear canal normal.  Left Ear: Hearing, tympanic membrane, external ear and ear canal normal.  Nose: Nose normal.  Mouth/Throat: Uvula is midline, oropharynx is clear and moist and mucous membranes are normal. No oropharyngeal exudate, posterior oropharyngeal edema or posterior oropharyngeal erythema.  Eyes: Conjunctivae and EOM are normal. Pupils are equal, round, and reactive to light. No scleral icterus.  Neck: Normal range of motion. Neck supple. Carotid bruit is not present. No thyromegaly present.  Cardiovascular: Normal rate, regular rhythm, normal heart sounds and intact distal pulses.   No murmur heard. Pulses:      Radial pulses are 2+ on the right side, and 2+ on the left side.  Pulmonary/Chest: Effort normal and breath sounds normal. No respiratory distress. He has no wheezes. He has no rales.  Abdominal: Soft. Bowel sounds are normal. He exhibits no distension and no mass. There is no tenderness. There is no  rebound and no guarding.  Musculoskeletal: Normal range of motion. He exhibits no edema.  Lymphadenopathy:    He has no cervical adenopathy.  Neurological: He is alert and oriented to person, place, and time.  CN grossly intact, station and gait intact Recall 3/3  Calculation 5/5 serial 7s  Skin: Skin is warm and dry. No rash noted.  Psychiatric: He has a normal mood and affect. His behavior is normal. Judgment and thought content normal.  Nursing note and vitals reviewed.  Results for orders placed or performed in visit on 04/02/14  TSH  Result Value Ref Range   TSH 0.47 0.35 - 4.50 uIU/mL  Hemoglobin A1c  Result Value Ref Range   Hgb A1c MFr Bld 7.3 (H) 4.6 - 6.5 %  CBC with Differential  Result Value Ref Range   WBC 6.7 4.0 - 10.5 K/uL   RBC 4.75 4.22 - 5.81 Mil/uL   Hemoglobin 14.7 13.0 - 17.0 g/dL   HCT 43.9 39.0 - 52.0 %   MCV 92.4 78.0 - 100.0 fl   MCHC 33.5 30.0 - 36.0 g/dL   RDW 13.9 11.5 - 15.5 %   Platelets 178.0 150.0 -  400.0 K/uL   Neutrophils Relative % 60.9 43.0 - 77.0 %   Lymphocytes Relative 21.2 12.0 - 46.0 %   Monocytes Relative 9.4 3.0 - 12.0 %   Eosinophils Relative 7.0 (H) 0.0 - 5.0 %   Basophils Relative 1.5 0.0 - 3.0 %   Neutro Abs 4.1 1.4 - 7.7 K/uL   Lymphs Abs 1.4 0.7 - 4.0 K/uL   Monocytes Absolute 0.6 0.1 - 1.0 K/uL   Eosinophils Absolute 0.5 0.0 - 0.7 K/uL   Basophils Absolute 0.1 0.0 - 0.1 K/uL  Basic metabolic panel  Result Value Ref Range   Sodium 138 135 - 145 mEq/L   Potassium 4.5 3.5 - 5.1 mEq/L   Chloride 102 96 - 112 mEq/L   CO2 29 19 - 32 mEq/L   Glucose, Bld 61 (L) 70 - 99 mg/dL   BUN 16 6 - 23 mg/dL   Creatinine, Ser 0.8 0.4 - 1.5 mg/dL   Calcium 9.7 8.4 - 10.5 mg/dL   GFR 105.00 >60.00 mL/min  Vitamin B12  Result Value Ref Range   Vitamin B-12 408 211 - 911 pg/mL      Assessment & Plan:   Problem List Items Addressed This Visit    Renal artery stenosis    Stable by annual imaging by cards.    Relevant Medications        simvastatin (ZOCOR) 40 MG tablet   Medicare annual wellness visit, subsequent - Primary    I have personally reviewed the Medicare Annual Wellness questionnaire and have noted 1. The patient's medical and social history 2. Their use of alcohol, tobacco or illicit drugs 3. Their current medications and supplements 4. The patient's functional ability including ADL's, fall risks, home safety risks and hearing or visual impairment. 5. Diet and physical activity 6. Evidence for depression or mood disorders The patients weight, height, BMI have been recorded in the chart.  Hearing and vision has been addressed. I have made referrals, counseling and provided education to the patient based review of the above and I have provided the pt with a written personalized care plan for preventive services. Provider list updated - see scanned questionairre. Reviewed preventative protocols and updated unless pt declined.     HYPERCHOLESTEROLEMIA    Check FLP today.    Relevant Medications      simvastatin (ZOCOR) 40 MG tablet   Other Relevant Orders      Lipid panel   Health maintenance examination    Preventative protocols reviewed and updated unless pt declined. Discussed healthy diet and lifestyle.     Essential hypertension    Chronic, stable. Continue current regimen.    Relevant Medications      simvastatin (ZOCOR) 40 MG tablet   Other Relevant Orders      Comprehensive metabolic panel   Controlled diabetes mellitus type 2 with complications    Check I3B today.    Relevant Medications      simvastatin (ZOCOR) 40 MG tablet   Other Relevant Orders      Hemoglobin A1c      Microalbumin / creatinine urine ratio   Advanced care planning/counseling discussion    Advanced directives: has forms, has not filled out. Unsure about HCPOA. Doesn't want prolonged life support.        Follow up plan: Return in about 6 months (around 04/08/2015), or as needed, for follow up visit.

## 2014-10-08 NOTE — Assessment & Plan Note (Signed)
Chronic, stable. Continue current regimen. 

## 2014-10-08 NOTE — Assessment & Plan Note (Signed)
Preventative protocols reviewed and updated unless pt declined. Discussed healthy diet and lifestyle.  

## 2014-10-08 NOTE — Progress Notes (Signed)
Pre visit review using our clinic review tool, if applicable. No additional management support is needed unless otherwise documented below in the visit note. 

## 2014-10-10 ENCOUNTER — Telehealth: Payer: Self-pay | Admitting: Family Medicine

## 2014-10-10 NOTE — Telephone Encounter (Signed)
emmi mailed  °

## 2014-10-14 ENCOUNTER — Encounter: Payer: Self-pay | Admitting: *Deleted

## 2014-10-22 ENCOUNTER — Other Ambulatory Visit: Payer: Self-pay | Admitting: *Deleted

## 2014-10-22 MED ORDER — METFORMIN HCL 1000 MG PO TABS: 1000.0000 mg | ORAL_TABLET | ORAL | Status: DC

## 2014-10-22 MED ORDER — METOPROLOL TARTRATE 50 MG PO TABS: 25.0000 mg | ORAL_TABLET | Freq: Two times a day (BID) | ORAL | Status: DC

## 2014-10-22 MED ORDER — LISINOPRIL 40 MG PO TABS: 40.0000 mg | ORAL_TABLET | Freq: Every day | ORAL | Status: DC

## 2014-11-19 DIAGNOSIS — S8991XA Unspecified injury of right lower leg, initial encounter: Secondary | ICD-10-CM | POA: Diagnosis not present

## 2014-11-19 DIAGNOSIS — M25461 Effusion, right knee: Secondary | ICD-10-CM | POA: Diagnosis not present

## 2014-11-19 DIAGNOSIS — S82831A Other fracture of upper and lower end of right fibula, initial encounter for closed fracture: Secondary | ICD-10-CM | POA: Diagnosis not present

## 2014-11-19 DIAGNOSIS — W009XXA Unspecified fall due to ice and snow, initial encounter: Secondary | ICD-10-CM | POA: Diagnosis not present

## 2014-11-19 DIAGNOSIS — S8391XA Sprain of unspecified site of right knee, initial encounter: Secondary | ICD-10-CM | POA: Diagnosis not present

## 2014-11-19 DIAGNOSIS — S99911A Unspecified injury of right ankle, initial encounter: Secondary | ICD-10-CM | POA: Diagnosis not present

## 2014-11-19 DIAGNOSIS — S82831D Other fracture of upper and lower end of right fibula, subsequent encounter for closed fracture with routine healing: Secondary | ICD-10-CM | POA: Diagnosis not present

## 2014-11-20 DIAGNOSIS — S82831A Other fracture of upper and lower end of right fibula, initial encounter for closed fracture: Secondary | ICD-10-CM | POA: Diagnosis not present

## 2014-11-21 ENCOUNTER — Telehealth: Payer: Self-pay | Admitting: Family Medicine

## 2014-11-21 DIAGNOSIS — S82024A Nondisplaced longitudinal fracture of right patella, initial encounter for closed fracture: Secondary | ICD-10-CM | POA: Diagnosis not present

## 2014-11-21 NOTE — Telephone Encounter (Signed)
Pt fell on Monday night (feb 15) and was taken to [facility] on Tues morning.  He was diagnosed with a broken ankle and referred to Podiatry (Dr Elvina Mattes) He had his appointment and was put in a hard cast.  It was then found (feb 17)  that he has a fracture in his knee.  Pt is now wearing a mobilizer in addition to the cast.  Pt's was instructed to put no weight on the leg at all.  Pt's wife and son are having a difficult time lifting and moving him for his daily living.  Pt has considered going to [facility] or some other skilled nursing center for a few weeks for rehabilitation. Pt 's wife also spoke bout respite care. Pt's wife is unable to bring him in for an appointment due to pt not being mobile.  Please call pts wife back at [phone] and advise what to do next. Thanks.

## 2014-11-22 ENCOUNTER — Other Ambulatory Visit: Payer: Self-pay | Admitting: *Deleted

## 2014-11-22 MED ORDER — SIMVASTATIN 40 MG PO TABS: 40.0000 mg | ORAL_TABLET | Freq: Every day | ORAL | Status: DC

## 2014-11-22 NOTE — Telephone Encounter (Signed)
Message left for patient's wife to return my call.  

## 2014-11-22 NOTE — Telephone Encounter (Signed)
Message left for patient to return my call.  

## 2014-11-22 NOTE — Telephone Encounter (Signed)
Spoke with patient's wife and she said that Dr. Shelby Mattocks office (ortho) was supposed to make [facility] referral to [place], but she hasn't heard from anyone yet. I advised that I would call [facility] to check on the status of the referral. Berneda Rose and got their answering service. Message left and Hayley said that she would have the nurse on-call call me back. Will await call.

## 2014-11-22 NOTE — Telephone Encounter (Signed)
Spoke with Jenny Reichmann at [facility] and VO given for SW. She said they would expedite referral because all the referral initially was was just PT and OT. Patient's wife in the meantime has called [facility] and they will need FL2. She went ahead and scheduled appt with you for 11/25/14 just in case the PT couldn't get out to qualify him before then.

## 2014-11-22 NOTE — Telephone Encounter (Signed)
Patient returned your call.  Patient said if she doesn't answer, leave a message on her voice mail and she'll call you right back. Please call her back at [phone].

## 2014-11-22 NOTE — Telephone Encounter (Signed)
Patient returned your call.

## 2014-11-22 NOTE — Telephone Encounter (Signed)
Called [facility] again. Had to leave another message. Awaiting return call to give verbal order for social work.

## 2014-11-22 NOTE — Telephone Encounter (Signed)
Does he have home health coming out? If so needs to call [facility] case management or social work to start process of getting him into SNF and they should start looking at different SNFs to choose one. If not recommend referral and he may need office visit to qualify.

## 2014-11-23 DIAGNOSIS — Z9181 History of falling: Secondary | ICD-10-CM | POA: Diagnosis not present

## 2014-11-23 DIAGNOSIS — N189 Chronic kidney disease, unspecified: Secondary | ICD-10-CM | POA: Diagnosis not present

## 2014-11-23 DIAGNOSIS — I129 Hypertensive chronic kidney disease with stage 1 through stage 4 chronic kidney disease, or unspecified chronic kidney disease: Secondary | ICD-10-CM | POA: Diagnosis not present

## 2014-11-23 DIAGNOSIS — E119 Type 2 diabetes mellitus without complications: Secondary | ICD-10-CM | POA: Diagnosis not present

## 2014-11-23 DIAGNOSIS — R262 Difficulty in walking, not elsewhere classified: Secondary | ICD-10-CM | POA: Diagnosis not present

## 2014-11-23 DIAGNOSIS — S82831A Other fracture of upper and lower end of right fibula, initial encounter for closed fracture: Secondary | ICD-10-CM | POA: Diagnosis not present

## 2014-11-23 DIAGNOSIS — S82024A Nondisplaced longitudinal fracture of right patella, initial encounter for closed fracture: Secondary | ICD-10-CM | POA: Diagnosis not present

## 2014-11-25 ENCOUNTER — Ambulatory Visit: Payer: Medicare Other | Admitting: Family Medicine

## 2014-11-25 ENCOUNTER — Telehealth: Payer: Self-pay | Admitting: Family Medicine

## 2014-11-25 DIAGNOSIS — S82024A Nondisplaced longitudinal fracture of right patella, initial encounter for closed fracture: Secondary | ICD-10-CM | POA: Diagnosis not present

## 2014-11-25 DIAGNOSIS — I129 Hypertensive chronic kidney disease with stage 1 through stage 4 chronic kidney disease, or unspecified chronic kidney disease: Secondary | ICD-10-CM | POA: Diagnosis not present

## 2014-11-25 DIAGNOSIS — R262 Difficulty in walking, not elsewhere classified: Secondary | ICD-10-CM | POA: Diagnosis not present

## 2014-11-25 DIAGNOSIS — E119 Type 2 diabetes mellitus without complications: Secondary | ICD-10-CM | POA: Diagnosis not present

## 2014-11-25 DIAGNOSIS — S82831A Other fracture of upper and lower end of right fibula, initial encounter for closed fracture: Secondary | ICD-10-CM | POA: Diagnosis not present

## 2014-11-25 DIAGNOSIS — Z9181 History of falling: Secondary | ICD-10-CM | POA: Diagnosis not present

## 2014-11-25 DIAGNOSIS — N189 Chronic kidney disease, unspecified: Secondary | ICD-10-CM | POA: Diagnosis not present

## 2014-11-25 NOTE — Telephone Encounter (Signed)
Morene Antu Trammell dropped off FL-2 forms for pt. Please call her at [phone] when ready for pick up.  Forms in Dr. Darnell Level inbox

## 2014-11-26 ENCOUNTER — Encounter: Payer: Self-pay | Admitting: Family Medicine

## 2014-11-26 DIAGNOSIS — R262 Difficulty in walking, not elsewhere classified: Secondary | ICD-10-CM | POA: Diagnosis not present

## 2014-11-26 DIAGNOSIS — N189 Chronic kidney disease, unspecified: Secondary | ICD-10-CM | POA: Diagnosis not present

## 2014-11-26 DIAGNOSIS — Z9181 History of falling: Secondary | ICD-10-CM | POA: Diagnosis not present

## 2014-11-26 DIAGNOSIS — E119 Type 2 diabetes mellitus without complications: Secondary | ICD-10-CM | POA: Diagnosis not present

## 2014-11-26 DIAGNOSIS — I129 Hypertensive chronic kidney disease with stage 1 through stage 4 chronic kidney disease, or unspecified chronic kidney disease: Secondary | ICD-10-CM | POA: Diagnosis not present

## 2014-11-26 DIAGNOSIS — S82024A Nondisplaced longitudinal fracture of right patella, initial encounter for closed fracture: Secondary | ICD-10-CM | POA: Diagnosis not present

## 2014-11-26 DIAGNOSIS — S82831A Other fracture of upper and lower end of right fibula, initial encounter for closed fracture: Secondary | ICD-10-CM | POA: Diagnosis not present

## 2014-11-26 NOTE — Telephone Encounter (Signed)
Filled and in Kim's box. 

## 2014-11-26 NOTE — Telephone Encounter (Signed)
Message left notifying Charles [name] and form placed up front for pick up.

## 2014-11-27 DIAGNOSIS — S82831D Other fracture of upper and lower end of right fibula, subsequent encounter for closed fracture with routine healing: Secondary | ICD-10-CM | POA: Diagnosis not present

## 2014-11-27 DIAGNOSIS — M5136 Other intervertebral disc degeneration, lumbar region: Secondary | ICD-10-CM | POA: Diagnosis not present

## 2014-11-27 DIAGNOSIS — S82024D Nondisplaced longitudinal fracture of right patella, subsequent encounter for closed fracture with routine healing: Secondary | ICD-10-CM | POA: Diagnosis not present

## 2014-11-27 DIAGNOSIS — Z9181 History of falling: Secondary | ICD-10-CM | POA: Diagnosis not present

## 2014-11-27 DIAGNOSIS — I10 Essential (primary) hypertension: Secondary | ICD-10-CM | POA: Diagnosis not present

## 2014-11-27 DIAGNOSIS — M25571 Pain in right ankle and joints of right foot: Secondary | ICD-10-CM | POA: Diagnosis not present

## 2014-11-27 DIAGNOSIS — M6281 Muscle weakness (generalized): Secondary | ICD-10-CM | POA: Diagnosis not present

## 2014-11-27 DIAGNOSIS — S82091D Other fracture of right patella, subsequent encounter for closed fracture with routine healing: Secondary | ICD-10-CM | POA: Diagnosis not present

## 2014-11-27 DIAGNOSIS — R262 Difficulty in walking, not elsewhere classified: Secondary | ICD-10-CM | POA: Diagnosis not present

## 2014-11-27 DIAGNOSIS — M25561 Pain in right knee: Secondary | ICD-10-CM | POA: Diagnosis not present

## 2014-11-27 DIAGNOSIS — E119 Type 2 diabetes mellitus without complications: Secondary | ICD-10-CM | POA: Diagnosis not present

## 2014-11-27 DIAGNOSIS — S99911D Unspecified injury of right ankle, subsequent encounter: Secondary | ICD-10-CM | POA: Diagnosis not present

## 2014-12-04 DIAGNOSIS — M25571 Pain in right ankle and joints of right foot: Secondary | ICD-10-CM | POA: Diagnosis not present

## 2014-12-04 DIAGNOSIS — S82831D Other fracture of upper and lower end of right fibula, subsequent encounter for closed fracture with routine healing: Secondary | ICD-10-CM | POA: Diagnosis not present

## 2014-12-12 DIAGNOSIS — M25561 Pain in right knee: Secondary | ICD-10-CM | POA: Diagnosis not present

## 2014-12-12 DIAGNOSIS — S82024D Nondisplaced longitudinal fracture of right patella, subsequent encounter for closed fracture with routine healing: Secondary | ICD-10-CM | POA: Diagnosis not present

## 2014-12-21 DIAGNOSIS — S82831A Other fracture of upper and lower end of right fibula, initial encounter for closed fracture: Secondary | ICD-10-CM | POA: Diagnosis not present

## 2014-12-21 DIAGNOSIS — R262 Difficulty in walking, not elsewhere classified: Secondary | ICD-10-CM | POA: Diagnosis not present

## 2014-12-21 DIAGNOSIS — S82024A Nondisplaced longitudinal fracture of right patella, initial encounter for closed fracture: Secondary | ICD-10-CM | POA: Diagnosis not present

## 2014-12-23 DIAGNOSIS — Z9181 History of falling: Secondary | ICD-10-CM | POA: Diagnosis not present

## 2014-12-23 DIAGNOSIS — I129 Hypertensive chronic kidney disease with stage 1 through stage 4 chronic kidney disease, or unspecified chronic kidney disease: Secondary | ICD-10-CM | POA: Diagnosis not present

## 2014-12-23 DIAGNOSIS — N189 Chronic kidney disease, unspecified: Secondary | ICD-10-CM | POA: Diagnosis not present

## 2014-12-23 DIAGNOSIS — R262 Difficulty in walking, not elsewhere classified: Secondary | ICD-10-CM | POA: Diagnosis not present

## 2014-12-23 DIAGNOSIS — E119 Type 2 diabetes mellitus without complications: Secondary | ICD-10-CM | POA: Diagnosis not present

## 2014-12-23 DIAGNOSIS — S82831A Other fracture of upper and lower end of right fibula, initial encounter for closed fracture: Secondary | ICD-10-CM | POA: Diagnosis not present

## 2014-12-23 DIAGNOSIS — S82024A Nondisplaced longitudinal fracture of right patella, initial encounter for closed fracture: Secondary | ICD-10-CM | POA: Diagnosis not present

## 2014-12-25 DIAGNOSIS — S82831D Other fracture of upper and lower end of right fibula, subsequent encounter for closed fracture with routine healing: Secondary | ICD-10-CM | POA: Diagnosis not present

## 2014-12-26 DIAGNOSIS — N189 Chronic kidney disease, unspecified: Secondary | ICD-10-CM | POA: Diagnosis not present

## 2014-12-26 DIAGNOSIS — S82831A Other fracture of upper and lower end of right fibula, initial encounter for closed fracture: Secondary | ICD-10-CM | POA: Diagnosis not present

## 2014-12-26 DIAGNOSIS — S82024A Nondisplaced longitudinal fracture of right patella, initial encounter for closed fracture: Secondary | ICD-10-CM | POA: Diagnosis not present

## 2014-12-26 DIAGNOSIS — E119 Type 2 diabetes mellitus without complications: Secondary | ICD-10-CM | POA: Diagnosis not present

## 2014-12-26 DIAGNOSIS — R262 Difficulty in walking, not elsewhere classified: Secondary | ICD-10-CM | POA: Diagnosis not present

## 2014-12-26 DIAGNOSIS — Z9181 History of falling: Secondary | ICD-10-CM | POA: Diagnosis not present

## 2014-12-26 DIAGNOSIS — I129 Hypertensive chronic kidney disease with stage 1 through stage 4 chronic kidney disease, or unspecified chronic kidney disease: Secondary | ICD-10-CM | POA: Diagnosis not present

## 2014-12-30 DIAGNOSIS — R262 Difficulty in walking, not elsewhere classified: Secondary | ICD-10-CM | POA: Diagnosis not present

## 2014-12-30 DIAGNOSIS — S82024A Nondisplaced longitudinal fracture of right patella, initial encounter for closed fracture: Secondary | ICD-10-CM | POA: Diagnosis not present

## 2014-12-30 DIAGNOSIS — E119 Type 2 diabetes mellitus without complications: Secondary | ICD-10-CM | POA: Diagnosis not present

## 2014-12-30 DIAGNOSIS — I129 Hypertensive chronic kidney disease with stage 1 through stage 4 chronic kidney disease, or unspecified chronic kidney disease: Secondary | ICD-10-CM | POA: Diagnosis not present

## 2014-12-30 DIAGNOSIS — S82831A Other fracture of upper and lower end of right fibula, initial encounter for closed fracture: Secondary | ICD-10-CM | POA: Diagnosis not present

## 2014-12-30 DIAGNOSIS — N189 Chronic kidney disease, unspecified: Secondary | ICD-10-CM | POA: Diagnosis not present

## 2014-12-30 DIAGNOSIS — Z9181 History of falling: Secondary | ICD-10-CM | POA: Diagnosis not present

## 2015-01-02 DIAGNOSIS — S82831A Other fracture of upper and lower end of right fibula, initial encounter for closed fracture: Secondary | ICD-10-CM | POA: Diagnosis not present

## 2015-01-02 DIAGNOSIS — E119 Type 2 diabetes mellitus without complications: Secondary | ICD-10-CM | POA: Diagnosis not present

## 2015-01-02 DIAGNOSIS — N189 Chronic kidney disease, unspecified: Secondary | ICD-10-CM | POA: Diagnosis not present

## 2015-01-02 DIAGNOSIS — R262 Difficulty in walking, not elsewhere classified: Secondary | ICD-10-CM | POA: Diagnosis not present

## 2015-01-02 DIAGNOSIS — I129 Hypertensive chronic kidney disease with stage 1 through stage 4 chronic kidney disease, or unspecified chronic kidney disease: Secondary | ICD-10-CM | POA: Diagnosis not present

## 2015-01-02 DIAGNOSIS — Z9181 History of falling: Secondary | ICD-10-CM | POA: Diagnosis not present

## 2015-01-02 DIAGNOSIS — S82024A Nondisplaced longitudinal fracture of right patella, initial encounter for closed fracture: Secondary | ICD-10-CM | POA: Diagnosis not present

## 2015-01-03 DIAGNOSIS — E119 Type 2 diabetes mellitus without complications: Secondary | ICD-10-CM | POA: Diagnosis not present

## 2015-01-03 DIAGNOSIS — Z9181 History of falling: Secondary | ICD-10-CM | POA: Diagnosis not present

## 2015-01-03 DIAGNOSIS — N189 Chronic kidney disease, unspecified: Secondary | ICD-10-CM | POA: Diagnosis not present

## 2015-01-03 DIAGNOSIS — S82024A Nondisplaced longitudinal fracture of right patella, initial encounter for closed fracture: Secondary | ICD-10-CM | POA: Diagnosis not present

## 2015-01-03 DIAGNOSIS — S82831A Other fracture of upper and lower end of right fibula, initial encounter for closed fracture: Secondary | ICD-10-CM | POA: Diagnosis not present

## 2015-01-03 DIAGNOSIS — R262 Difficulty in walking, not elsewhere classified: Secondary | ICD-10-CM | POA: Diagnosis not present

## 2015-01-03 DIAGNOSIS — I129 Hypertensive chronic kidney disease with stage 1 through stage 4 chronic kidney disease, or unspecified chronic kidney disease: Secondary | ICD-10-CM | POA: Diagnosis not present

## 2015-01-06 DIAGNOSIS — E119 Type 2 diabetes mellitus without complications: Secondary | ICD-10-CM | POA: Diagnosis not present

## 2015-01-06 DIAGNOSIS — Z9181 History of falling: Secondary | ICD-10-CM | POA: Diagnosis not present

## 2015-01-06 DIAGNOSIS — R262 Difficulty in walking, not elsewhere classified: Secondary | ICD-10-CM | POA: Diagnosis not present

## 2015-01-06 DIAGNOSIS — N189 Chronic kidney disease, unspecified: Secondary | ICD-10-CM | POA: Diagnosis not present

## 2015-01-06 DIAGNOSIS — S82024A Nondisplaced longitudinal fracture of right patella, initial encounter for closed fracture: Secondary | ICD-10-CM | POA: Diagnosis not present

## 2015-01-06 DIAGNOSIS — S82831A Other fracture of upper and lower end of right fibula, initial encounter for closed fracture: Secondary | ICD-10-CM | POA: Diagnosis not present

## 2015-01-06 DIAGNOSIS — I129 Hypertensive chronic kidney disease with stage 1 through stage 4 chronic kidney disease, or unspecified chronic kidney disease: Secondary | ICD-10-CM | POA: Diagnosis not present

## 2015-01-08 DIAGNOSIS — E119 Type 2 diabetes mellitus without complications: Secondary | ICD-10-CM | POA: Diagnosis not present

## 2015-01-08 DIAGNOSIS — I129 Hypertensive chronic kidney disease with stage 1 through stage 4 chronic kidney disease, or unspecified chronic kidney disease: Secondary | ICD-10-CM | POA: Diagnosis not present

## 2015-01-08 DIAGNOSIS — R262 Difficulty in walking, not elsewhere classified: Secondary | ICD-10-CM | POA: Diagnosis not present

## 2015-01-08 DIAGNOSIS — S82831A Other fracture of upper and lower end of right fibula, initial encounter for closed fracture: Secondary | ICD-10-CM | POA: Diagnosis not present

## 2015-01-08 DIAGNOSIS — N189 Chronic kidney disease, unspecified: Secondary | ICD-10-CM | POA: Diagnosis not present

## 2015-01-08 DIAGNOSIS — Z9181 History of falling: Secondary | ICD-10-CM | POA: Diagnosis not present

## 2015-01-08 DIAGNOSIS — S82831D Other fracture of upper and lower end of right fibula, subsequent encounter for closed fracture with routine healing: Secondary | ICD-10-CM | POA: Diagnosis not present

## 2015-01-08 DIAGNOSIS — S82024A Nondisplaced longitudinal fracture of right patella, initial encounter for closed fracture: Secondary | ICD-10-CM | POA: Diagnosis not present

## 2015-01-14 DIAGNOSIS — S82831A Other fracture of upper and lower end of right fibula, initial encounter for closed fracture: Secondary | ICD-10-CM | POA: Diagnosis not present

## 2015-01-14 DIAGNOSIS — N189 Chronic kidney disease, unspecified: Secondary | ICD-10-CM | POA: Diagnosis not present

## 2015-01-14 DIAGNOSIS — Z9181 History of falling: Secondary | ICD-10-CM | POA: Diagnosis not present

## 2015-01-14 DIAGNOSIS — R262 Difficulty in walking, not elsewhere classified: Secondary | ICD-10-CM | POA: Diagnosis not present

## 2015-01-14 DIAGNOSIS — E119 Type 2 diabetes mellitus without complications: Secondary | ICD-10-CM | POA: Diagnosis not present

## 2015-01-14 DIAGNOSIS — I129 Hypertensive chronic kidney disease with stage 1 through stage 4 chronic kidney disease, or unspecified chronic kidney disease: Secondary | ICD-10-CM | POA: Diagnosis not present

## 2015-01-14 DIAGNOSIS — S82024A Nondisplaced longitudinal fracture of right patella, initial encounter for closed fracture: Secondary | ICD-10-CM | POA: Diagnosis not present

## 2015-01-16 DIAGNOSIS — R262 Difficulty in walking, not elsewhere classified: Secondary | ICD-10-CM | POA: Diagnosis not present

## 2015-01-16 DIAGNOSIS — E119 Type 2 diabetes mellitus without complications: Secondary | ICD-10-CM | POA: Diagnosis not present

## 2015-01-16 DIAGNOSIS — I129 Hypertensive chronic kidney disease with stage 1 through stage 4 chronic kidney disease, or unspecified chronic kidney disease: Secondary | ICD-10-CM | POA: Diagnosis not present

## 2015-01-16 DIAGNOSIS — S82831A Other fracture of upper and lower end of right fibula, initial encounter for closed fracture: Secondary | ICD-10-CM | POA: Diagnosis not present

## 2015-01-16 DIAGNOSIS — S82024A Nondisplaced longitudinal fracture of right patella, initial encounter for closed fracture: Secondary | ICD-10-CM | POA: Diagnosis not present

## 2015-01-16 DIAGNOSIS — Z9181 History of falling: Secondary | ICD-10-CM | POA: Diagnosis not present

## 2015-01-16 DIAGNOSIS — N189 Chronic kidney disease, unspecified: Secondary | ICD-10-CM | POA: Diagnosis not present

## 2015-01-21 DIAGNOSIS — S82831D Other fracture of upper and lower end of right fibula, subsequent encounter for closed fracture with routine healing: Secondary | ICD-10-CM | POA: Diagnosis not present

## 2015-02-04 ENCOUNTER — Other Ambulatory Visit: Payer: Self-pay | Admitting: *Deleted

## 2015-02-04 MED ORDER — "INSULIN SYRINGE-NEEDLE U-100 30G X 1/2"" 1 ML MISC": 1.0000 | Status: DC

## 2015-02-04 MED ORDER — OMEPRAZOLE 20 MG PO CPDR: 20.0000 mg | DELAYED_RELEASE_CAPSULE | Freq: Every day | ORAL | Status: DC

## 2015-02-12 ENCOUNTER — Other Ambulatory Visit: Payer: Self-pay | Admitting: *Deleted

## 2015-02-12 MED ORDER — LISINOPRIL 40 MG PO TABS: 40.0000 mg | ORAL_TABLET | Freq: Every day | ORAL | Status: DC

## 2015-04-08 ENCOUNTER — Ambulatory Visit (INDEPENDENT_AMBULATORY_CARE_PROVIDER_SITE_OTHER): Payer: Medicare Other | Admitting: Family Medicine

## 2015-04-08 ENCOUNTER — Encounter: Payer: Self-pay | Admitting: Family Medicine

## 2015-04-08 VITALS — BP 200/110 | HR 86 | Temp 97.7°F | Wt 194.8 lb

## 2015-04-08 DIAGNOSIS — I701 Atherosclerosis of renal artery: Secondary | ICD-10-CM

## 2015-04-08 DIAGNOSIS — E7800 Pure hypercholesterolemia, unspecified: Secondary | ICD-10-CM

## 2015-04-08 DIAGNOSIS — E118 Type 2 diabetes mellitus with unspecified complications: Secondary | ICD-10-CM | POA: Diagnosis not present

## 2015-04-08 DIAGNOSIS — I10 Essential (primary) hypertension: Secondary | ICD-10-CM

## 2015-04-08 LAB — BASIC METABOLIC PANEL
BUN: 16 mg/dL (ref 6–23)
CALCIUM: 9.7 mg/dL (ref 8.4–10.5)
CO2: 31 mEq/L (ref 19–32)
Chloride: 105 mEq/L (ref 96–112)
Creatinine, Ser: 0.8 mg/dL (ref 0.40–1.50)
GFR: 98.71 mL/min (ref >60.00)
GLUCOSE: 156 mg/dL — AB (ref 70–99)
Potassium: 5 mEq/L (ref 3.5–5.1)
Sodium: 139 mEq/L (ref 135–145)

## 2015-04-08 LAB — HEMOGLOBIN A1C: Hgb A1c MFr Bld: 7.3 % — ABNORMAL HIGH (ref 4.6–6.5)

## 2015-04-08 MED ORDER — AMLODIPINE BESYLATE 5 MG PO TABS: 5.0000 mg | ORAL_TABLET | Freq: Every day | ORAL | Status: DC

## 2015-04-08 MED ORDER — ATORVASTATIN CALCIUM 20 MG PO TABS: 20.0000 mg | ORAL_TABLET | Freq: Every day | ORAL | Status: DC

## 2015-04-08 NOTE — Assessment & Plan Note (Signed)
Chronic, stable. Continue simvastatin.  

## 2015-04-08 NOTE — Progress Notes (Signed)
Pre visit review using our clinic review tool, if applicable. No additional management support is needed unless otherwise documented below in the visit note. 

## 2015-04-08 NOTE — Progress Notes (Signed)
BP 200/110 mmHg  Pulse 86  Temp(Src) 97.7 F (36.5 C) (Oral)  Wt 194 lb 12.8 oz (88.361 kg)  SpO2 100%   CC:  6 mo f/u visit Subjective:    Patient ID: Charles [name], male    DOB: Jun 07, 1932, 81 y.o.   MRN: [id number]  HPI: Charles [name] is a 81 y.o. male presenting on 04/08/2015 for Follow-up   HTN - Compliant with current antihypertensive regimen of lasix 40mg  prn (about twice a month), lisinopril 40mg  daily, metoprolol 25mg  bid.  Does not check blood pressures at home.  No low blood pressure readings or symptoms of dizziness/syncope.  Denies HA, vision changes, CP/tightness, SOB, leg swelling.    DM - regularly does check sugars 99-178 fasting and at bedtime. Compliant with antihyperglycemic regimen which includes: metformin 1000mg /500mg /1000mg , lantus 28 u qhs.  Denies low sugars or hypoglycemic symptoms. Denies paresthesias. Last diabetic eye exam next week.  Pneumovax: 2004.  Prevnar: 2015. Lab Results  Component Value Date   HGBA1C 7.2* 10/08/2014   Diabetic Foot Exam - Simple   Simple Foot Form  Diabetic Foot exam was performed with the following findings:  Yes 04/08/2015 11:04 AM  Visual Inspection  No deformities, no ulcerations, no other skin breakdown bilaterally:  Yes  Sensation Testing  Intact to touch and monofilament testing bilaterally:  Yes  Pulse Check  Posterior Tibialis and Dorsalis pulse intact bilaterally:  Yes  Comments      HLD - on simvastatin 40mg  daily without myalgias.   Mid lower back pain ongoing. No shooting pain down legs. Taking ibuprofen 400mg  BID regularly. Occasionally takes tylenol as well.   Relevant past medical, surgical, family and social history reviewed and updated as indicated. Interim medical history since our last visit reviewed. Allergies and medications reviewed and updated. Current Outpatient Prescriptions on File Prior to Visit  Medication Sig  . acetaminophen (TYLENOL) 500 MG tablet Take 500 mg by mouth every 8 (eight)  hours as needed.  [name] aspirin EC 81 MG tablet Take 81 mg by mouth daily.  [name] BAYER MICROLET LANCETS lancets Ck blood sugar three times daily and as directed. Dx 250.00. Insulin dependent  . citalopram (CELEXA) 20 MG tablet Take 1 tablet (20 mg total) by mouth daily.  . Cyanocobalamin (B-12) 500 MCG TABS Take 1 tablet by mouth daily.  . folic acid (FOLVITE) 1 MG tablet Take 1 mg by mouth daily.  . furosemide (LASIX) 40 MG tablet Take 1 tablet (40 mg total) by mouth daily as needed.  [name] glucose blood (BAYER CONTOUR NEXT TEST) test strip Ck blood sugar three times daily and as directed. Dx 250.00 insulin dependent  . insulin glargine (LANTUS) 100 UNIT/ML injection Inject 0.28 mLs (28 Units total) into the skin at bedtime.  . Insulin Syringe-Needle U-100 (B-D INS SYR ULTRAFINE 1CC/30G) 30G X 1/2" 1 ML MISC 1 Syringe by Does not apply route as directed.  [name] lisinopril (PRINIVIL,ZESTRIL) 40 MG tablet Take 1 tablet (40 mg total) by mouth daily.  . metoprolol (LOPRESSOR) 50 MG tablet Take 0.5 tablets (25 mg total) by mouth 2 (two) times daily.  [name] omeprazole (PRILOSEC) 20 MG capsule Take 1 capsule (20 mg total) by mouth daily.  . potassium chloride SA (K-DUR,KLOR-CON) 20 MEQ tablet Take 1 tablet (20 mEq total) by mouth daily. When taking furosemide  . saw palmetto 160 MG capsule Take 450 mg by mouth daily.   . simvastatin (ZOCOR) 40 MG tablet Take 1 tablet (40 mg total)  by mouth at bedtime.  . traZODone (DESYREL) 50 MG tablet Take 1 tablet (50 mg total) by mouth at bedtime.  . vitamin C (ASCORBIC ACID) 500 MG tablet Take 500 mg by mouth daily.   No current facility-administered medications on file prior to visit.    Review of Systems Per HPI unless specifically indicated above     Objective:    BP 200/110 mmHg  Pulse 86  Temp(Src) 97.7 F (36.5 C) (Oral)  Wt 194 lb 12.8 oz (88.361 kg)  SpO2 100%  Wt Readings from Last 3 Encounters:  04/08/15 194 lb 12.8 oz (88.361 kg)  10/08/14 197 lb (89.359  kg)  04/02/14 199 lb 8 oz (90.493 kg)    Physical Exam  Constitutional: He appears well-developed and well-nourished. No distress.  HENT:  Head: Normocephalic and atraumatic.  Right Ear: External ear normal.  Left Ear: External ear normal.  Nose: Nose normal.  Mouth/Throat: Oropharynx is clear and moist. No oropharyngeal exudate.  Eyes: Conjunctivae and EOM are normal. Pupils are equal, round, and reactive to light. No scleral icterus.  Neck: Normal range of motion. Neck supple.  Cardiovascular: Normal rate, regular rhythm and intact distal pulses.   Murmur (3/6 SEM best at apex and again at LUSB) heard. Pulmonary/Chest: Effort normal and breath sounds normal. No respiratory distress. He has no wheezes. He has no rales.  Musculoskeletal: He exhibits no edema.  See HPI for foot exam if done  Lymphadenopathy:    He has no cervical adenopathy.  Skin: Skin is warm and dry. No rash noted.  Psychiatric: He has a normal mood and affect.  Nursing note and vitals reviewed.  Results for orders placed or performed in visit on 10/08/14  Lipid panel  Result Value Ref Range   Cholesterol 148 0 - 200 mg/dL   Triglycerides 156.0 (H) 0.0 - 149.0 mg/dL   HDL 31.40 (L) >39.00 mg/dL   VLDL 31.2 0.0 - 40.0 mg/dL   LDL Cholesterol 85 0 - 99 mg/dL   Total CHOL/HDL Ratio 5    NonHDL 116.60   Comprehensive metabolic panel  Result Value Ref Range   Sodium 139 135 - 145 mEq/L   Potassium 5.1 3.5 - 5.1 mEq/L   Chloride 104 96 - 112 mEq/L   CO2 30 19 - 32 mEq/L   Glucose, Bld 152 (H) 70 - 99 mg/dL   BUN 16 6 - 23 mg/dL   Creatinine, Ser 0.8 0.4 - 1.5 mg/dL   Total Bilirubin 0.7 0.2 - 1.2 mg/dL   Alkaline Phosphatase 54 39 - 117 U/L   AST 19 0 - 37 U/L   ALT 17 0 - 53 U/L   Total Protein 7.1 6.0 - 8.3 g/dL   Albumin 4.1 3.5 - 5.2 g/dL   Calcium 9.5 8.4 - 10.5 mg/dL   GFR 101.76 >60.00 mL/min  Hemoglobin A1c  Result Value Ref Range   Hgb A1c MFr Bld 7.2 (H) 4.6 - 6.5 %  Microalbumin /  creatinine urine ratio  Result Value Ref Range   Microalb, Ur 5.6 (H) 0.0 - 1.9 mg/dL   Creatinine,U 187.2 mg/dL   Microalb Creat Ratio 3.0 0.0 - 30.0 mg/g      Assessment & Plan:   Problem List Items Addressed This Visit    Controlled diabetes mellitus type 2 with complications - Primary    Chronic, stable. Continue current regimen. Pt endorses good cbg control on lower metformin dose - will continue 1000mg  BID.  Relevant Medications   metFORMIN (GLUCOPHAGE) 1000 MG tablet   atorvastatin (LIPITOR) 20 MG tablet   Essential hypertension    Markedly elevated BP today - pt attributes to coffee, family stress. Advised monitor BP at home and if persistently elevated recommend he start amlodipine (will change zocor to lipitor if amlodipine started). Pt agrees with plan.      Relevant Medications   atorvastatin (LIPITOR) 20 MG tablet   amLODipine (NORVASC) 5 MG tablet   HYPERCHOLESTEROLEMIA    Chronic, stable. Continue simvastatin.      Relevant Medications   atorvastatin (LIPITOR) 20 MG tablet   amLODipine (NORVASC) 5 MG tablet   Renal artery stenosis    Followed by cards. ?worsening given elevated BP noted today.      Relevant Medications   atorvastatin (LIPITOR) 20 MG tablet   amLODipine (NORVASC) 5 MG tablet       Follow up plan: Return in about 6 months (around 10/09/2015), or as needed, for medicare wellness.

## 2015-04-08 NOTE — Assessment & Plan Note (Signed)
Markedly elevated BP today - pt attributes to coffee, family stress. Advised monitor BP at home and if persistently elevated recommend he start amlodipine (will change zocor to lipitor if amlodipine started). Pt agrees with plan.

## 2015-04-08 NOTE — Addendum Note (Signed)
Addended by: [name] on: 04/08/2015 11:21 AM   Modules accepted: Orders

## 2015-04-08 NOTE — Patient Instructions (Addendum)
Ok to do lower metformin dose (1000mg  twice daily). Monitor blood pressure at home - if persistently >160/100 then start amlodipine 5mg  daily (printed prescription provided today). If you fill amlodipine, stop simvastatin and change to atorvastatin (printed prescription also provided today)  Goal blood pressure <140/90 Return in 6 months for medicare wellness visit, sooner if blood pressure staying uncontrolled.

## 2015-04-08 NOTE — Assessment & Plan Note (Signed)
Followed by cards. ?worsening given elevated BP noted today.

## 2015-04-08 NOTE — Assessment & Plan Note (Signed)
Chronic, stable. Continue current regimen. Pt endorses good cbg control on lower metformin dose - will continue 1000mg  BID.

## 2015-04-17 DIAGNOSIS — H35712 Central serous chorioretinopathy, left eye: Secondary | ICD-10-CM | POA: Diagnosis not present

## 2015-04-17 DIAGNOSIS — H26493 Other secondary cataract, bilateral: Secondary | ICD-10-CM | POA: Diagnosis not present

## 2015-04-17 DIAGNOSIS — H34832 Tributary (branch) retinal vein occlusion, left eye: Secondary | ICD-10-CM | POA: Diagnosis not present

## 2015-04-17 DIAGNOSIS — E119 Type 2 diabetes mellitus without complications: Secondary | ICD-10-CM | POA: Diagnosis not present

## 2015-05-30 ENCOUNTER — Encounter: Payer: Self-pay | Admitting: Family Medicine

## 2015-05-30 ENCOUNTER — Ambulatory Visit (INDEPENDENT_AMBULATORY_CARE_PROVIDER_SITE_OTHER): Payer: Medicare Other | Admitting: Family Medicine

## 2015-05-30 VITALS — BP 118/64 | HR 54 | Temp 98.1°F | Wt 194.0 lb

## 2015-05-30 DIAGNOSIS — R0602 Shortness of breath: Secondary | ICD-10-CM | POA: Diagnosis not present

## 2015-05-30 DIAGNOSIS — R8290 Unspecified abnormal findings in urine: Secondary | ICD-10-CM

## 2015-05-30 DIAGNOSIS — I5022 Chronic systolic (congestive) heart failure: Secondary | ICD-10-CM | POA: Diagnosis not present

## 2015-05-30 LAB — POCT URINALYSIS DIPSTICK
BILIRUBIN UA: NEGATIVE
Ketones, UA: NEGATIVE
Leukocytes, UA: NEGATIVE
NITRITE UA: NEGATIVE
PH UA: 5.5
Protein, UA: 15
Spec Grav, UA: 1.025
Urobilinogen, UA: 0.2

## 2015-05-30 LAB — BASIC METABOLIC PANEL
BUN: 19 mg/dL (ref 6–23)
CHLORIDE: 102 meq/L (ref 96–112)
CO2: 28 mEq/L (ref 19–32)
Calcium: 9.6 mg/dL (ref 8.4–10.5)
Creatinine, Ser: 0.91 mg/dL (ref 0.40–1.50)
GFR: 85.04 mL/min (ref >60.00)
Glucose, Bld: 430 mg/dL — ABNORMAL HIGH (ref 70–99)
POTASSIUM: 4.1 meq/L (ref 3.5–5.1)
SODIUM: 138 meq/L (ref 135–145)

## 2015-05-30 LAB — BRAIN NATRIURETIC PEPTIDE: Pro B Natriuretic peptide (BNP): 536 pg/mL — ABNORMAL HIGH (ref 0.0–100.0)

## 2015-05-30 LAB — URINALYSIS, MICROSCOPIC ONLY

## 2015-05-30 NOTE — Patient Instructions (Signed)
Swelling likely worse from amlodipine, not atorvastatin (cholesterol med). Stay off both for now.  Go to the lab on the way out.  We'll contact you with your lab report. Take lasix daily until swelling is better.  When better, can go back to as needed dosing.  Update Dr. Darnell Level Monday.  Take care.  Glad to see you.

## 2015-05-30 NOTE — Progress Notes (Signed)
Pre visit review using our clinic review tool, if applicable. No additional management support is needed unless otherwise documented below in the visit note.  Had edema when started on amlodipine and atorvastatin.  BP is okay now.  Off amlodipine and lipitor in meantime, since last week.  Didn't have BLE edema until starting amlodipine and atorvastatin.  Swelling is better now.  No CP.   He can get SOB when supine, at night.  Sleeping on 1 pillow at night.   Was taking lasix when the swelling started, taken everyday until 2 days ago none since.  No dysuria.  No abd pain.   Meds, vitals, and allergies reviewed.   ROS: See HPI.  Otherwise, noncontributory.  GEN: nad, alert and oriented HEENT: mucous membranes moist NECK: supple w/o LA CV: rrr. Murmur noted, systolic, old finding per patient.  PULM: ctab except for dec BS at the bases B, no inc wob ABD: soft, +bs EXT: 1+ BLE edema SKIN: no acute rash

## 2015-06-01 NOTE — Assessment & Plan Note (Addendum)
Prev with LV Wall Motion: Mild global LV dysfunction with EF 45% in 2014.  Will defer to PCP about possible f/u echo.   Approaching euvolemia based on exam.  Would continue lasix for now, can change back to PRN edema is improved.  Likely with sx from CHF and from amlodipine, much improved now.  Will defer statin choice to PCP.  Told patient to avoid amlodipine.  I told patient/son that amlodipine was a good choice at the time, but we had no way of knowing that he would have inc in peripheral edema that would preclude use.  See notes on labs.   His u/a had blood noted but micro didn't, ie likely false positive.  No dysuria.  I didn't w/u further.   >25 minutes spent in face to face time with patient, >50% spent in counselling or coordination of care.

## 2015-06-02 ENCOUNTER — Telehealth: Payer: Self-pay

## 2015-06-02 NOTE — Telephone Encounter (Signed)
Measure early AM daily weight, after urination and before eating breakfast.  Bring daily weights to f/u with Dr. Darnell Level.  Take an extra dose of lasix today (ie 2 pills today) then go back to 1 pill a day prn.  Thanks.  Routed to PCP as FYI.

## 2015-06-02 NOTE — Telephone Encounter (Signed)
Scott pts son left v/m with pt update; pt was seen 05/30/15; pt has been taking lasix; pt is the same; the breathing is the same as when seen on 05/30/15 and the swelling of the feet are the same. Scott wants to know if pt needs to be rechecked or what to do.

## 2015-06-02 NOTE — Telephone Encounter (Signed)
Son advised and repeated instructions correctly.  Appointment scheduled with Dr. Darnell Level for FU.

## 2015-06-06 ENCOUNTER — Ambulatory Visit (INDEPENDENT_AMBULATORY_CARE_PROVIDER_SITE_OTHER)
Admission: RE | Admit: 2015-06-06 | Discharge: 2015-06-06 | Disposition: A | Discharge status: Home / Self Care | Payer: Medicare Other | Source: Ambulatory Visit | Attending: Family Medicine | Admitting: Family Medicine

## 2015-06-06 ENCOUNTER — Ambulatory Visit (INDEPENDENT_AMBULATORY_CARE_PROVIDER_SITE_OTHER): Payer: Medicare Other | Admitting: Family Medicine

## 2015-06-06 ENCOUNTER — Encounter: Payer: Self-pay | Admitting: Family Medicine

## 2015-06-06 ENCOUNTER — Ambulatory Visit: Payer: Medicare Other | Admitting: Family Medicine

## 2015-06-06 VITALS — BP 128/74 | HR 71 | Temp 97.4°F | Wt 203.0 lb

## 2015-06-06 DIAGNOSIS — F331 Major depressive disorder, recurrent, moderate: Secondary | ICD-10-CM

## 2015-06-06 DIAGNOSIS — I5022 Chronic systolic (congestive) heart failure: Secondary | ICD-10-CM

## 2015-06-06 DIAGNOSIS — R0601 Orthopnea: Secondary | ICD-10-CM

## 2015-06-06 DIAGNOSIS — R600 Localized edema: Secondary | ICD-10-CM

## 2015-06-06 DIAGNOSIS — F411 Generalized anxiety disorder: Secondary | ICD-10-CM

## 2015-06-06 DIAGNOSIS — I509 Heart failure, unspecified: Secondary | ICD-10-CM | POA: Diagnosis not present

## 2015-06-06 DIAGNOSIS — J90 Pleural effusion, not elsewhere classified: Secondary | ICD-10-CM | POA: Diagnosis not present

## 2015-06-06 LAB — BASIC METABOLIC PANEL
BUN: 20 mg/dL (ref 6–23)
CALCIUM: 9.8 mg/dL (ref 8.4–10.5)
CO2: 31 mEq/L (ref 19–32)
Chloride: 104 mEq/L (ref 96–112)
Creatinine, Ser: 0.77 mg/dL (ref 0.40–1.50)
GFR: 103.12 mL/min (ref >60.00)
GLUCOSE: 167 mg/dL — AB (ref 70–99)
POTASSIUM: 4.2 meq/L (ref 3.5–5.1)
SODIUM: 143 meq/L (ref 135–145)

## 2015-06-06 LAB — BRAIN NATRIURETIC PEPTIDE: PRO B NATRI PEPTIDE: 374 pg/mL — AB (ref 0.0–100.0)

## 2015-06-06 LAB — TSH: TSH: 2.66 u[IU]/mL (ref 0.35–4.50)

## 2015-06-06 MED ORDER — CITALOPRAM HYDROBROMIDE 20 MG PO TABS: 30.0000 mg | ORAL_TABLET | Freq: Every day | ORAL | Status: DC

## 2015-06-06 MED ORDER — FUROSEMIDE 40 MG PO TABS: 40.0000 mg | ORAL_TABLET | Freq: Two times a day (BID) | ORAL | Status: DC | PRN

## 2015-06-06 MED ORDER — POTASSIUM CHLORIDE CRYS ER 20 MEQ PO TBCR: 20.0000 meq | EXTENDED_RELEASE_TABLET | Freq: Two times a day (BID) | ORAL | Status: DC | PRN

## 2015-06-06 MED ORDER — LORAZEPAM 0.5 MG PO TABS: 0.2500 mg | ORAL_TABLET | Freq: Two times a day (BID) | ORAL | Status: DC | PRN

## 2015-06-06 NOTE — Assessment & Plan Note (Signed)
See below - start ativan prn coping with wife's recent terminal dx.

## 2015-06-06 NOTE — Patient Instructions (Addendum)
I'm sorry to hear about your wife! Increase celexa to 30mg  daily (1.5 tablets daily) Start temporary course of anxiety medicine called ativan - start at 1/2 tablet daily as it can cause sleepiness/sedation. Just temporary course as this can be habit forming.  labwork today, xray today. Increase lasix to 40mg  twice daily, take 1 potassium pill with each lasix.  See Rosaria Ferries on your way out to schedule ultrasound of heart. Return to see me in 2 weeks for follow up, sooner if needed.

## 2015-06-06 NOTE — Assessment & Plan Note (Signed)
Deteriorated with wife's recent fulminant medical diagnosis of breast cancer with mets to brain. Discussed this, support provided. Increase celexa to 30mg  daily, add prn 1/2-1 tab ativan, discussed temporary course, discussed could be habit forming, discussed side effects including sedation/grogginess.

## 2015-06-06 NOTE — Assessment & Plan Note (Signed)
Anticipate acute CHF exacerbation possibly brought on by amlodipine. Trend BNP today, check BMP after 2 wks of regular lasix 40mg  use.  Persistent orthopnea, persistent pedal edema (although some improvement noted). Increase lasix to 40mg  BID, increase Kdur to 17mEq BID with lasix. Check CXR, update echo - as last EF 45% (2014). Pt agrees with plan. Overdue for f/u with Dr [name].

## 2015-06-06 NOTE — Progress Notes (Signed)
Pre visit review using our clinic review tool, if applicable. No additional management support is needed unless otherwise documented below in the visit note. 

## 2015-06-06 NOTE — Progress Notes (Signed)
BP 128/74 mmHg  Pulse 71  Temp(Src) 97.4 F (36.3 C) (Oral)  Wt 203 lb (92.08 kg)  SpO2 94%   CC: f/u visit  Subjective:    Patient ID: Charles [name], male    DOB: Jul 25, 1932, 81 y.o.   MRN: [id number]  HPI: Charles [name] is a 81 y.o. male presenting on 06/06/2015 for Follow-up; Shortness of Breath; and Foot Swelling   Presents with son. Wife recently diagnosed with brain cancer. Wife currently in hospital. Pt struggling with this. Requests medication for this.   See prior note for details. Seen by Dr Damita Dunnings 8/26 with concern for worsening pedal edema after amlodipine and atorvastatin were started. He stopped both these medications.  Also notices worsening orthopnea over last 4-6 wks, sleeping on up to 1 pillow. Sitting up in bed resolves dyspnea.  Started taking lasix 40mg  daily for the past 2 weeks. Good effect. Has started wearing pull ups due to urge incontinence from lasix.   Last echo done 2014 - mild global LV dysfunction with EF 45%.  Relevant past medical, surgical, family and social history reviewed and updated as indicated. Interim medical history since our last visit reviewed. Allergies and medications reviewed and updated. Current Outpatient Prescriptions on File Prior to Visit  Medication Sig  . acetaminophen (TYLENOL) 500 MG tablet Take 500 mg by mouth every 8 (eight) hours as needed.  [name] aspirin EC 81 MG tablet Take 81 mg by mouth daily.  [name] BAYER MICROLET LANCETS lancets Ck blood sugar three times daily and as directed. Dx 250.00. Insulin dependent  . Cyanocobalamin (B-12) 500 MCG TABS Take 1 tablet by mouth daily.  . folic acid (FOLVITE) 1 MG tablet Take 1 mg by mouth daily.  [name] glucose blood (BAYER CONTOUR NEXT TEST) test strip Ck blood sugar three times daily and as directed. Dx 250.00 insulin dependent  . ibuprofen (ADVIL,MOTRIN) 200 MG tablet Take 400 mg by mouth 2 (two) times daily as needed.  . insulin glargine (LANTUS) 100 UNIT/ML injection Inject 0.28  mLs (28 Units total) into the skin at bedtime.  . Insulin Syringe-Needle U-100 (B-D INS SYR ULTRAFINE 1CC/30G) 30G X 1/2" 1 ML MISC 1 Syringe by Does not apply route as directed.  [name] lisinopril (PRINIVIL,ZESTRIL) 40 MG tablet Take 1 tablet (40 mg total) by mouth daily.  . metFORMIN (GLUCOPHAGE) 1000 MG tablet Take 1 tablet (1,000 mg total) by mouth 2 (two) times daily with a meal.  . metoprolol (LOPRESSOR) 50 MG tablet Take 0.5 tablets (25 mg total) by mouth 2 (two) times daily.  [name] omeprazole (PRILOSEC) 20 MG capsule Take 1 capsule (20 mg total) by mouth daily.  . saw palmetto 160 MG capsule Take 450 mg by mouth daily.   . traZODone (DESYREL) 50 MG tablet Take 1 tablet (50 mg total) by mouth at bedtime.  . vitamin C (ASCORBIC ACID) 500 MG tablet Take 500 mg by mouth daily.   No current facility-administered medications on file prior to visit.    Review of Systems Per HPI unless specifically indicated above     Objective:    BP 128/74 mmHg  Pulse 71  Temp(Src) 97.4 F (36.3 C) (Oral)  Wt 203 lb (92.08 kg)  SpO2 94%  Wt Readings from Last 3 Encounters:  06/06/15 203 lb (92.08 kg)  05/30/15 194 lb (87.998 kg)  04/08/15 194 lb 12.8 oz (88.361 kg)    Physical Exam  Constitutional: He appears well-developed and well-nourished. No distress.  HENT:  Mouth/Throat: Oropharynx is clear and moist. No oropharyngeal exudate.  Cardiovascular: Normal rate, regular rhythm and intact distal pulses.   Murmur (3/6 SEM best at LUSB) heard. Pulmonary/Chest: Effort normal. No respiratory distress. He has no wheezes. He has no rales.  Diffuse crackles bibasilarly  Musculoskeletal: He exhibits edema (1+ pedal edema).  Skin: Skin is warm and dry. No rash noted.  Psychiatric: His speech is normal and behavior is normal. Thought content normal. His mood appears anxious. He exhibits a depressed mood.  Upset with discussion of wife's illness  Nursing note and vitals reviewed.  Results for orders placed  or performed in visit on 92/11/94  Basic metabolic panel  Result Value Ref Range   Sodium 138 135 - 145 mEq/L   Potassium 4.1 3.5 - 5.1 mEq/L   Chloride 102 96 - 112 mEq/L   CO2 28 19 - 32 mEq/L   Glucose, Bld 430 (H) 70 - 99 mg/dL   BUN 19 6 - 23 mg/dL   Creatinine, Ser 0.91 0.40 - 1.50 mg/dL   Calcium 9.6 8.4 - 10.5 mg/dL   GFR 85.04 >60.00 mL/min  Brain natriuretic peptide  Result Value Ref Range   Pro B Natriuretic peptide (BNP) 536.0 (H) 0.0 - 100.0 pg/mL  Urinalysis, microscopic only  Result Value Ref Range   WBC, UA 3-6/hpf (A) 0-2/hpf   Squamous Epithelial / LPF Rare(0-4/hpf) Rare(0-4/hpf)   Bacteria, UA Rare(<10/hpf) (A) None  POCT Urinalysis Dipstick  Result Value Ref Range   Color, UA Yellow    Clarity, UA Clear    Glucose, UA 3+    Bilirubin, UA Neg    Ketones, UA Neg    Spec Grav, UA 1.025    Blood, UA 1+    pH, UA 5.5    Protein, UA 15    Urobilinogen, UA 0.2    Nitrite, UA Neg    Leukocytes, UA Negative Negative      Assessment & Plan:   Problem List Items Addressed This Visit    Anxiety state    See below - start ativan prn coping with wife's recent terminal dx.      Relevant Medications   citalopram (CELEXA) 20 MG tablet   LORazepam (ATIVAN) 0.5 MG tablet   MDD (major depressive disorder), recurrent episode, moderate    Deteriorated with wife's recent fulminant medical diagnosis of breast cancer with mets to brain. Discussed this, support provided. Increase celexa to 30mg  daily, add prn 1/2-1 tab ativan, discussed temporary course, discussed could be habit forming, discussed side effects including sedation/grogginess.      Relevant Medications   citalopram (CELEXA) 20 MG tablet   LORazepam (ATIVAN) 0.5 MG tablet   Systolic CHF   Relevant Medications   furosemide (LASIX) 40 MG tablet   Other Relevant Orders   DG Chest 2 View   Echocardiogram   TSH   Orthopnea - Primary    Anticipate acute CHF exacerbation possibly brought on by amlodipine.  Trend BNP today, check BMP after 2 wks of regular lasix 40mg  use.  Persistent orthopnea, persistent pedal edema (although some improvement noted). Increase lasix to 40mg  BID, increase Kdur to 52mEq BID with lasix. Check CXR, update echo - as last EF 45% (2014). Pt agrees with plan. Overdue for f/u with Dr [name].      Relevant Orders   DG Chest 2 View   Echocardiogram   Basic metabolic panel   Brain natriuretic peptide   TSH    Other Visit Diagnoses  Pedal edema        Relevant Orders    TSH        Follow up plan: Return in about 2 weeks (around 06/20/2015), or if symptoms worsen or fail to improve, for follow up visit.

## 2015-06-07 ENCOUNTER — Other Ambulatory Visit: Payer: Self-pay | Admitting: Family Medicine

## 2015-06-10 ENCOUNTER — Telehealth: Payer: Self-pay | Admitting: Family Medicine

## 2015-06-10 NOTE — Telephone Encounter (Addendum)
When is echo scheduled for?  Any cough/wheeze?  So no improvement with lasix 40mg  bid?  Is dyspnea only when laying down - or is he also short winded at rest sitting up? rec schedule f/u with Dr [name]. plz offer recheck in office this week.

## 2015-06-10 NOTE — Telephone Encounter (Signed)
Patient's son notified and appt scheduled. Advised to go to ER if worsening over night. He verbalized understanding.

## 2015-06-10 NOTE — Telephone Encounter (Signed)
Patient's son,Scott,called.  He returned Kim's call about the x-ray results.  Scott said patient has increased labored breathing.  [name] would like advice on what to do about patient's breathing.

## 2015-06-10 NOTE — Telephone Encounter (Signed)
plz schedule tomorrow at 12:15pm if son thinks can wait.  If worsening to be evaluated at ER overnight. Let's back down on celexa to 1 tab daily, decrease lasix to 40mg  once daily.

## 2015-06-10 NOTE — Telephone Encounter (Signed)
Echo is scheduled for 06/13/15 -He has a slight wheeze sitting and laying -Dyspnea when sitting up as well, but not as bad as when laying down -Can't ambulate as far as he was doing -Son says he is worse since increasing the lasix

## 2015-06-10 NOTE — Telephone Encounter (Signed)
Also, son states patient is having a hard time sleeping due to not being able to lay down without having trouble breathing. There is no DPR in chart.

## 2015-06-11 ENCOUNTER — Telehealth: Payer: Self-pay | Admitting: Family Medicine

## 2015-06-11 ENCOUNTER — Ambulatory Visit (INDEPENDENT_AMBULATORY_CARE_PROVIDER_SITE_OTHER): Payer: Medicare Other | Admitting: Family Medicine

## 2015-06-11 ENCOUNTER — Telehealth: Payer: Self-pay | Admitting: Cardiovascular Disease

## 2015-06-11 ENCOUNTER — Encounter: Payer: Self-pay | Admitting: Family Medicine

## 2015-06-11 VITALS — BP 126/68 | HR 64 | Temp 97.9°F | Wt 206.0 lb

## 2015-06-11 DIAGNOSIS — R0602 Shortness of breath: Secondary | ICD-10-CM | POA: Diagnosis not present

## 2015-06-11 DIAGNOSIS — I4892 Unspecified atrial flutter: Secondary | ICD-10-CM

## 2015-06-11 DIAGNOSIS — I483 Typical atrial flutter: Secondary | ICD-10-CM | POA: Insufficient documentation

## 2015-06-11 LAB — TROPONIN I: TNIDX: 0.05 ug/l (ref 0.00–0.06)

## 2015-06-11 MED ORDER — APIXABAN 5 MG PO TABS: 5.0000 mg | ORAL_TABLET | Freq: Two times a day (BID) | ORAL | Status: DC

## 2015-06-11 NOTE — Telephone Encounter (Signed)
I called patient's insurance company. They said that the generic for Eliquis is much cheaper, but not covered under his formulary. The only other recommendation that they had was Clopidogrel 75mg  tabs, which would be Tier 2 and could be filled for 120 tabs for 30 days, which would be up to 4 tabs daily.

## 2015-06-11 NOTE — Telephone Encounter (Signed)
Charles [name] has been evaluated by his PCP, Dr. [name] twice in the last two weeks with shortness of breath and volume overload.  His lasix was increased without improvement.  ECG revealed atrial flutter with ventricular rates in the 60s.  BP 126/68.  Dr. [name] will start him on apixaban 5mg  bid and he will follow up in clinic this week for management of atrial flutter.  He was instructed to go to the ED if he becomes unstable.

## 2015-06-11 NOTE — Progress Notes (Signed)
Pre visit review using our clinic review tool, if applicable. No additional management support is needed unless otherwise documented below in the visit note. 

## 2015-06-11 NOTE — Telephone Encounter (Signed)
Can we call pharmacy to see if they ran med as generic or brand? generic (apixiban) is ok if cheaper (but I expect they already ran this and it was $500.)

## 2015-06-11 NOTE — Assessment & Plan Note (Addendum)
EKG - new atrial flutter with rate of 40s, normal axis, precordial ST changes new since last EKG 2014 but good R wave progression. This likely explains dyspnea on exertion and orthopnea and fatigue he has been experiencing. I touched base with cardiology regarding patient, recommend starting eliquis 5mg  bid and eval by cardiology this week in office. Likely will need cardioversion but will need several weeks of anticoagulation. I have asked him/son to price out eliquis and call me if unaffordable - to consider coumadin with lovenox bridging.  He seems to be tolerating his atrial flutter with stable blood pressure, pulse ox - I think we can proceed with outpatient management of this arrhythmia. Discussed red flags to seek ER care (including chest pain, worsening dyspnea, malaise). We have been able to schedule appointment tomorrow morning with cardiologist. I will check STAT Troponin I to further evaluate for acute cardiac injury. BP/HR likely too low to add diltiazem, will defer to cardiology.  He has echo scheduled for 4pm Friday.

## 2015-06-11 NOTE — Progress Notes (Signed)
BP 126/68 mmHg  Pulse 64  Temp(Src) 97.9 F (36.6 C) (Oral)  Wt 206 lb (93.441 kg)  SpO2 92%   CC: f/u dyspnea  Subjective:    Patient ID: Charles [name], male    DOB: 1932/01/17, 81 y.o.   MRN: [id number]  HPI: Charles [name] is a 81 y.o. male presenting on 06/11/2015 for Shortness of Breath   Presents with son. See prior note for details. Briefly, seen by my partner 8/26 with pedal edema and worsening orthopnea after amlodipine was recently started. Amlodipine was discontinued and he was treated for presumed acute CHF exacerbation with lasix 40mg  daily. BNP at that time 500s. Saw me last week without significant improvement so lasix was increased to BID over weeked. CXR with faint pleural effusions bilaterally but no pulmonary edema. Presents today with persistent orthopnea and dyspnea, as well as persistent L>R pedal edema (although some better). Persistent fatigue and marked dyspnea on exertion.   Denies chest pain, fever, nausea, abd pain, palpitations or dizziness. Endorses good appetite. Eating protein with every meal.  Significant stress recently - wife recently diagnosed with brain cancer, currently in hospital. Pt struggling with wife's illness. Last visit we increased celexa to 30mg  daily but this was decreased back to 20mg  daily yesterday. Also placed on PRN lorazepam.   Last nuclear stress test done 11/2012 - mild global LV dysfunction with EF 45% - low risk test.  Relevant past medical, surgical, family and social history reviewed and updated as indicated. Interim medical history since our last visit reviewed. Allergies and medications reviewed and updated. Current Outpatient Prescriptions on File Prior to Visit  Medication Sig  . acetaminophen (TYLENOL) 500 MG tablet Take 500 mg by mouth every 8 (eight) hours as needed.  [name] aspirin EC 81 MG tablet Take 81 mg by mouth daily.  [name] BAYER MICROLET LANCETS lancets Ck blood sugar three times daily and as directed. Dx 250.00.  Insulin dependent  . Cyanocobalamin (B-12) 500 MCG TABS Take 1 tablet by mouth daily.  . folic acid (FOLVITE) 1 MG tablet Take 1 mg by mouth daily.  [name] glucose blood (BAYER CONTOUR NEXT TEST) test strip Ck blood sugar three times daily and as directed. Dx 250.00 insulin dependent  . ibuprofen (ADVIL,MOTRIN) 200 MG tablet Take 400 mg by mouth 2 (two) times daily as needed.  . insulin glargine (LANTUS) 100 UNIT/ML injection Inject 0.28 mLs (28 Units total) into the skin at bedtime.  . Insulin Syringe-Needle U-100 (B-D INS SYR ULTRAFINE 1CC/30G) 30G X 1/2" 1 ML MISC 1 Syringe by Does not apply route as directed.  [name] lisinopril (PRINIVIL,ZESTRIL) 40 MG tablet Take 1 tablet (40 mg total) by mouth daily.  [name] LORazepam (ATIVAN) 0.5 MG tablet Take 0.5-1 tablets (0.25-0.5 mg total) by mouth 2 (two) times daily as needed for anxiety.  . metFORMIN (GLUCOPHAGE) 1000 MG tablet Take 1 tablet (1,000 mg total) by mouth 2 (two) times daily with a meal.  . metoprolol (LOPRESSOR) 50 MG tablet Take 0.5 tablets (25 mg total) by mouth 2 (two) times daily.  [name] omeprazole (PRILOSEC) 20 MG capsule Take 1 capsule (20 mg total) by mouth daily.  . potassium chloride SA (K-DUR,KLOR-CON) 20 MEQ tablet Take 1 tablet (20 mEq total) by mouth daily. When taking furosemide  . saw palmetto 160 MG capsule Take 450 mg by mouth daily.   . traZODone (DESYREL) 50 MG tablet Take 1 tablet (50 mg total) by mouth at bedtime.  . vitamin C (ASCORBIC  ACID) 500 MG tablet Take 500 mg by mouth daily.   No current facility-administered medications on file prior to visit.   Past Medical History  Diagnosis Date  . Arthritis     in lower back  . History of chicken pox   . Depression   . T2DM (type 2 diabetes mellitus) 1995  . GERD (gastroesophageal reflux disease) 2003    h/o duodenal ulcer per EGD as well as esophagitis  . Seasonal allergies   . CAD (coronary artery disease) 2004    s/p 4v CABG (Dr. [name])  . HTN (hypertension)   . HLD  (hyperlipidemia)   . Colon polyp 2005    (Dr. Tiffany Kocher)  . Urge incontinence of urine   . Renal artery stenosis 2004    70% bilateral, followed by cards  . Psoriasis   . COPD (chronic obstructive pulmonary disease) 03/2011    by xray  . Carotid stenosis 1999    s/p L CEA  . History of colon polyps 2003, 2005    adenomatous Vira Agar)  . Ex-smoker   . Compression fracture of L1 lumbar vertebra remote  . DDD (degenerative disc disease), lumbar 2014  . BRVO (branch retinal vein occlusion) 2015    bilateral [facility])    Past Surgical History  Procedure Laterality Date  . Tonsillectomy    . Carotid endarterectomy  1999    left [facility])  . Coronary artery bypass graft  2004    4v CABG ([facility]) with LIMA to LAD, vein graft to RCA, 1st obtuse marginal, and ramus intermedius  . Colonoscopy  2003    colon polyp x3 - adenomatous Tiffany Kocher)  . Cardiovascular stress test  11/2010    normal perfusion, no evidence of ischemia, EF 62% post exercise  . Upper gastrointestinal endoscopy  2003    reflux esophagitis, erosive gastropathy, duodenal ulcer  . Cardiovascular stress test  11/28/2012    Mild diaphragmatic attenuation; cannot exclude a focal region of nontransmural inferior scar  . Cardiac catheterization  03/06/2003    No intervention - recommend CABG  . Coronary artery bypass graft  03/07/2003    x4. LIMA to LAD, SVG to ramus intermediate, SVG to OM, and SVG to RCA.  [name] Renal doppler  11/29/2011    Celiac&SMA-demonstrated vessel narrowing suggestive of a greater than 50% diameter reduction. Bilateral renal arteries-demonstrated vessel narrowing of 60-99% diameter reduction. Rt Kidney-mid pole lateral simple cyst noted measuring 1.29x0.76x1.11cm and exophytic cyst outside lower pole measuring 1.23x0.96x1.31. Lft Kidney-lateral mid to lower pole simple cyst measuering-1.24x9.83x1.24  . Cataract extraction Bilateral 01/2013    [facility]  . Colonoscopy  10/08/2012    2 TA, diverticulosis, int hem, no rpt  rec Tiffany Kocher)  . Esophagogastroduodenoscopy  1/5/204    nl esophagus, duodenitis and erosive gastropathy, path - gastropathy no Hpylori, no rpt rec     Review of Systems Per HPI unless specifically indicated above     Objective:    BP 126/68 mmHg  Pulse 64  Temp(Src) 97.9 F (36.6 C) (Oral)  Wt 206 lb (93.441 kg)  SpO2 92%  Wt Readings from Last 3 Encounters:  06/11/15 206 lb (93.441 kg)  06/06/15 203 lb (92.08 kg)  05/30/15 194 lb (87.998 kg)    Physical Exam  Constitutional: He appears well-developed and well-nourished.  HENT:  Mouth/Throat: Oropharynx is clear and moist. No oropharyngeal exudate.  Neck: No JVD present.  Cardiovascular: An irregular rhythm present. Bradycardia present.   Murmur (3/6 systolic murmur) heard. Slightly irregular  Pulmonary/Chest: Effort normal. No respiratory distress. He has no wheezes. He has rales (bibasilarly).  Musculoskeletal: He exhibits edema (1+ pedal edema bilaterally, mild erythema).  Skin: Skin is warm and dry. There is erythema (mild BLE).  Psychiatric: He has a normal mood and affect.  Nursing note and vitals reviewed.  Results for orders placed or performed in visit on 06/11/15  Troponin I  Result Value Ref Range   TNIDX 0.05 0.00 - 0.06 ug/l      Assessment & Plan:   Problem List Items Addressed This Visit    Atrial flutter, unspecified - Primary    EKG - new atrial flutter with rate of 40s, normal axis, precordial ST changes new since last EKG 2014 but good R wave progression. This likely explains dyspnea on exertion and orthopnea and fatigue he has been experiencing. I touched base with cardiology regarding patient, recommend starting eliquis 5mg  bid and eval by cardiology this week in office. Likely will need cardioversion but will need several weeks of anticoagulation. I have asked him/son to price out eliquis and call me if unaffordable - to consider coumadin with lovenox bridging.  He seems to be tolerating his atrial  flutter with stable blood pressure, pulse ox - I think we can proceed with outpatient management of this arrhythmia. Discussed red flags to seek ER care (including chest pain, worsening dyspnea, malaise). We have been able to schedule appointment tomorrow morning with cardiologist. I will check STAT Troponin I to further evaluate for acute cardiac injury. BP/HR likely too low to add diltiazem, will defer to cardiology.  He has echo scheduled for 4pm Friday.      Relevant Medications   furosemide (LASIX) 40 MG tablet   apixaban (ELIQUIS) 5 MG TABS tablet   Other Relevant Orders   Troponin I (Completed)   Ambulatory referral to Cardiology    Other Visit Diagnoses    SOB (shortness of breath)        Relevant Orders    EKG 12-Lead (Completed)    Ambulatory referral to Cardiology        Follow up plan: No Follow-up on file.   ADDENDUM ==> pt unable to afford eliquis $500/month - we have called insurance company who says cheapest med would be plavix. Will defer to cards re anticoagulant choice.

## 2015-06-11 NOTE — Patient Instructions (Addendum)
Take lasix 40mg  just once daily. Start apixiban 5mg  twice daily - blood thinner while we get you in to see cardiology. Labwork today. Pass by Allison's office to schedule cardiology appointment. If worsening shortness of breath or any chest pain, please go to ER.

## 2015-06-11 NOTE — Telephone Encounter (Signed)
Son called in - The medication that was called in is expensive. It is 500.00.  Please call [phone]

## 2015-06-12 ENCOUNTER — Encounter: Payer: Self-pay | Admitting: Cardiology

## 2015-06-12 ENCOUNTER — Ambulatory Visit: Payer: Medicare Other | Admitting: Cardiovascular Disease

## 2015-06-12 ENCOUNTER — Ambulatory Visit (INDEPENDENT_AMBULATORY_CARE_PROVIDER_SITE_OTHER): Payer: Medicare Other | Admitting: Cardiology

## 2015-06-12 VITALS — BP 150/80 | HR 46 | Ht 73.0 in | Wt 205.0 lb

## 2015-06-12 DIAGNOSIS — I739 Peripheral vascular disease, unspecified: Secondary | ICD-10-CM | POA: Diagnosis not present

## 2015-06-12 DIAGNOSIS — I779 Disorder of arteries and arterioles, unspecified: Secondary | ICD-10-CM | POA: Diagnosis not present

## 2015-06-12 DIAGNOSIS — I4892 Unspecified atrial flutter: Secondary | ICD-10-CM

## 2015-06-12 MED ORDER — APIXABAN 5 MG PO TABS: 5.0000 mg | ORAL_TABLET | Freq: Two times a day (BID) | ORAL | Status: DC

## 2015-06-12 NOTE — Progress Notes (Signed)
06/12/2015 Charles [name]   11-Sep-1932  [id number]  Primary Physician Charles Bush, MD Primary Cardiologist: Dr. Gwenlyn [name]   Reason for Visit/CC: new onset atrial flutter with CVR; Acute CHF  HPI:  The patient is a 81 year old male, followed by Dr. Gwenlyn [name], with a history of CAD status post CABG x 4 in 2004 with a LIMA to his LAD, a vein to the right coronary artery, obtuse marginal branch and ramus branch. He also was [name] to have a 70% bilateral renal artery stenosis which we have been following by duplex ultrasound. He has had left carotid endarterectomy which we follow as well. His other problems include hypertension, hyperlipidemia and diabetes for which he takes insulin. His last functional study performed 2/14 was nonischemic.  He was recently evaluated by his PCP, Dr. Danise [name], for evaluation of shortness of breath and volume overload. EKG revealed atrial flutter with ventricular rates in the 60s. Subsequently Dr. Danise [name] started him on Eliquis, 5 mg twice a day for stroke prophylaxis. Recent laboratory work demonstrated normal electrolytes and normal thyroid function test. BNP was elevated at 374. He has now been referred back to our office for further management of his new arrhythmia.  He is accompanied to clinic today by his son. He denies any symptoms at rest but notes dyspnea with exertion. No palpitations and no chest pain. He denies syncope/ near syncope. He does note 2 pillow orthopnea and bilateral LEE. He denies any prior h/o stroke/ TIA or OSA. He has been tolerating Eliquis well w/o abnormal bleeding.      Current Outpatient Prescriptions  Medication Sig Dispense Refill  . acetaminophen (TYLENOL) 500 MG tablet Take 500 mg by mouth every 8 (eight) hours as needed for moderate pain.     [name] apixaban (ELIQUIS) 5 MG TABS tablet Take 1 tablet (5 mg total) by mouth 2 (two) times daily. 60 tablet 1  . aspirin EC 81 MG tablet Take 81 mg by mouth daily.    [name] atorvastatin  (LIPITOR) 20 MG tablet Take 20 mg by mouth daily at 6 PM.    . BAYER MICROLET LANCETS lancets Ck blood sugar three times daily and as directed. Dx 250.00. Insulin dependent 100 each 11  . citalopram (CELEXA) 20 MG tablet Take 1 tablet (20 mg total) by mouth daily.    . Cyanocobalamin (B-12) 500 MCG TABS Take 1 tablet by mouth daily.    . folic acid (FOLVITE) 1 MG tablet Take 1 mg by mouth daily.    . furosemide (LASIX) 40 MG tablet Take 1 tablet (40 mg total) by mouth daily.    [name] glucose blood (BAYER CONTOUR NEXT TEST) test strip Ck blood sugar three times daily and as directed. Dx 250.00 insulin dependent 100 each 11  . ibuprofen (ADVIL,MOTRIN) 200 MG tablet Take 400 mg by mouth 2 (two) times daily as needed for moderate pain.     [name] insulin glargine (LANTUS) 100 UNIT/ML injection Inject 0.28 mLs (28 Units total) into the skin at bedtime. 10 mL 11  . Insulin Syringe-Needle U-100 (B-D INS SYR ULTRAFINE 1CC/30G) 30G X 1/2" 1 ML MISC 1 Syringe by Does not apply route as directed. 100 each 3  . lisinopril (PRINIVIL,ZESTRIL) 40 MG tablet Take 1 tablet (40 mg total) by mouth daily. 90 tablet 1  . LORazepam (ATIVAN) 0.5 MG tablet Take 0.5-1 tablets (0.25-0.5 mg total) by mouth 2 (two) times daily as needed for anxiety. 30 tablet 0  . metFORMIN (GLUCOPHAGE) 1000 MG tablet Take  1 tablet (1,000 mg total) by mouth 2 (two) times daily with a meal.    . metoprolol (LOPRESSOR) 50 MG tablet Take 0.5 tablets (25 mg total) by mouth 2 (two) times daily. 30 tablet 6  . omeprazole (PRILOSEC) 20 MG capsule Take 1 capsule (20 mg total) by mouth daily. 90 capsule 3  . potassium chloride SA (K-DUR,KLOR-CON) 20 MEQ tablet Take 1 tablet (20 mEq total) by mouth daily. When taking furosemide    . saw palmetto 160 MG capsule Take 450 mg by mouth daily.     . traZODone (DESYREL) 50 MG tablet Take 1 tablet (50 mg total) by mouth at bedtime. 90 tablet 3  . vitamin C (ASCORBIC ACID) 500 MG tablet Take 500 mg by mouth daily.      No current facility-administered medications for this visit.    Allergies  Allergen Reactions  . Amlodipine Other (See Comments)    Edema  . Other Other (See Comments)    myalgias Horse serum Horse serum  . Rosiglitazone Other (See Comments)    REACTION: Did not help  . Rosiglitazone Maleate     REACTION: Did not help  . Tricor [Fenofibrate] Other (See Comments)    myalgias    Social History   Social History  . Marital Status: Married    Spouse Name: N/A  . Number of Children: N/A  . Years of Education: N/A   Occupational History  . Not on file.   Social History Main Topics  . Smoking status: Former Smoker -- 0.10 packs/day    Types: Cigarettes    Quit date: 07/23/2013  . Smokeless tobacco: Never Used     Comment: 1/2 ppd  . Alcohol Use: No  . Drug Use: No  . Sexual Activity: Not on file   Other Topics Concern  . Not on file   Social History Narrative   Caffeine: 4 cups coffee/day, some tea and soda   Lives with wife   Occupation: Retired   Activity: no regular exercise   Diet: good water, fruits/vegetables daily     Review of Systems: General: negative for chills, fever, night sweats or weight changes.  Cardiovascular: negative for chest pain, dyspnea on exertion, edema, orthopnea, palpitations, paroxysmal nocturnal dyspnea or shortness of breath Dermatological: negative for rash Respiratory: negative for cough or wheezing Urologic: negative for hematuria Abdominal: negative for nausea, vomiting, diarrhea, bright red blood per rectum, melena, or hematemesis Neurologic: negative for visual changes, syncope, or dizziness All other systems reviewed and are otherwise negative except as noted above.    Blood pressure 150/80, pulse 46, height 6\' 1"  (1.854 m), weight 205 lb (92.987 kg).  General appearance: alert, cooperative and no distress Neck: no carotid bruit and no JVD Lungs: clear to auscultation bilaterally Heart: regular rate and rhythm, S1,  S2 normal, no murmur, click, rub or gallop Extremities: no LEE Pulses: 2+ and symmetric Skin: warm and dry Neurologic: Grossly normal  EKG atrial flutter with a slow ventricular response.   ASSESSMENT AND PLAN:   1. New Onset Atrial Flutter: EKG today continues to show atrial flutter with a SVR. HR 48 bpm but tolerating well, other than mild volume overload. TSH and electrolytes are normal. 2D echo pending to assess LV function/ wall motion. Will continue metoprolol for rate control. Agree with Eliquis for a/c given CHA2DS2 VASc score > 2 (Age >75, HTN, DM and Vas Dz). Will repeat EKG after 3 weeks of anticoagulation with Eliquis. If still in afib, will  consider DCCV.   2. CAD: h/o CABG x 4 in 2004 with low risk myoview in 2014. He denies any recent angina. Continue medical therapy.   3. Bilateral Carotid Artery Disease: s/p eft carotid endarterectomy. Asymptomatic. Dopplers followed yearly by Dr. Gwenlyn [name].  Last assessment showed stable disease.   4. Bilateral RAS: followed yearly by renal dopplers. Most recent study 07/2014 showed60-99% diameter reduction bilaterally by renal/aorta ratio.   5. HTN: controlled on current regimen.   6. HLD: on atorvastatin 20 mg daily.   7. DM: on insulin. Most recent Hgb A1c near goal at 7.2. Followed by PCP.  8. Cardiac Murmur: heard along the precordium. Loudest at RUSB. Will obtain a 2D echo to further evaluate.   9. Acute CHF: in the setting of new atrial flutter. 2D echo pending to assess LV function. Will increase Lasix to 40 mg BID x 2 days followed by continuation of 40 mg daily. Also advised to increase supplemental K x 2 days as well. Continue low sodium diet.   PLAN  Continue rate control with metoprolol and  Eliquis for a/c. Get 2D echo. F/u in 3 weeks for repeat EGK. If still in afib, can consider DCCV.   Lyda Jester PA-C 06/12/2015 9:35 AM

## 2015-06-12 NOTE — Patient Instructions (Addendum)
Medication Instructions:  Your physician has recommended you make the following change in your medication:  1.  Increase the Lasix 40 mg by taking 1 tablet twice a day for 2 days then go back to 1 tablet a day. 2.  Increase the Potassium 20 meq by taking 1 tablet twice a day for 2 days then go back to 1 tablet a day. 3.  Start Eliquis 5 mg taking 1 tablet twice a day.  You have been given a free 30 day Eliquis card.      Labwork: None ordered  Testing/Procedures: None ordered  Follow-Up: Your physician wants you to follow-up in 3 weeks with either Dr. [name] or a P/A for repeat EKG and consideration of a Cardioversion.[name]  Special Instructions:  Low-Sodium Eating Plan Sodium raises blood pressure and causes water to be held in the body. Getting less sodium from food will help lower your blood pressure, reduce any swelling, and protect your heart, liver, and kidneys. We get sodium by adding salt (sodium chloride) to food. Most of our sodium comes from canned, boxed, and frozen foods. Restaurant foods, fast foods, and pizza are also very high in sodium. Even if you take medicine to lower your blood pressure or to reduce fluid in your body, getting less sodium from your food is important. WHAT IS MY PLAN? Most people should limit their sodium intake to 2,300 mg a day. Your health care provider recommends that you limit your sodium intake to __________ a day.  WHAT DO I NEED TO KNOW ABOUT THIS EATING PLAN? For the low-sodium eating plan, you will follow these general guidelines:  Choose foods with a % Daily Value for sodium of less than 5% (as listed on the food label).   Use salt-free seasonings or herbs instead of table salt or sea salt.   Check with your health care provider or pharmacist before using salt substitutes.   Eat fresh foods.  Eat more vegetables and fruits.  Limit canned vegetables. If you do use them, rinse them well to decrease the sodium.   Limit cheese to 1 oz  (28 g) per day.   Eat lower-sodium products, often labeled as "lower sodium" or "no salt added."  Avoid foods that contain monosodium glutamate (MSG). MSG is sometimes added to [place] food and some canned foods.  Check food labels (Nutrition Facts labels) on foods to learn how much sodium is in one serving.  Eat more home-cooked food and less restaurant, buffet, and fast food.  When eating at a restaurant, ask that your food be prepared with less salt or none, if possible.  HOW DO I READ FOOD LABELS FOR SODIUM INFORMATION? The Nutrition Facts label lists the amount of sodium in one serving of the food. If you eat more than one serving, you must multiply the listed amount of sodium by the number of servings. Food labels may also identify foods as:  Sodium free--Less than 5 mg in a serving.  Very low sodium--35 mg or less in a serving.  Low sodium--140 mg or less in a serving.  Light in sodium--50% less sodium in a serving. For example, if a food that usually has 300 mg of sodium is changed to become light in sodium, it will have 150 mg of sodium.  Reduced sodium--25% less sodium in a serving. For example, if a food that usually has 400 mg of sodium is changed to reduced sodium, it will have 300 mg of sodium. WHAT FOODS CAN I EAT?  Grains Low-sodium cereals, including oats, puffed wheat and rice, and shredded wheat cereals. Low-sodium crackers. Unsalted rice and pasta. Lower-sodium bread.  Vegetables Frozen or fresh vegetables. Low-sodium or reduced-sodium canned vegetables. Low-sodium or reduced-sodium tomato sauce and paste. Low-sodium or reduced-sodium tomato and vegetable juices.  Fruits Fresh, frozen, and canned fruit. Fruit juice.  Meat and Other Protein Products Low-sodium canned tuna and salmon. Fresh or frozen meat, poultry, seafood, and fish. Lamb. Unsalted nuts. Dried beans, peas, and lentils without added salt. Unsalted canned beans. Homemade soups without salt.  Eggs.  Dairy Milk. Soy milk. Ricotta cheese. Low-sodium or reduced-sodium cheeses. Yogurt.  Condiments Fresh and dried herbs and spices. Salt-free seasonings. Onion and garlic powders. Low-sodium varieties of mustard and ketchup. Lemon juice.  Fats and Oils Reduced-sodium salad dressings. Unsalted butter.  Other Unsalted popcorn and pretzels.  The items listed above may not be a complete list of recommended foods or beverages. Contact your dietitian for more options. WHAT FOODS ARE NOT RECOMMENDED? Grains Instant hot cereals. Bread stuffing, pancake, and biscuit mixes. Croutons. Seasoned rice or pasta mixes. Noodle soup cups. Boxed or frozen macaroni and cheese. Self-rising flour. Regular salted crackers. Vegetables Regular canned vegetables. Regular canned tomato sauce and paste. Regular tomato and vegetable juices. Frozen vegetables in sauces. Salted french fries. Olives. Angie Fava. Relishes. Sauerkraut. Salsa. Meat and Other Protein Products Salted, canned, smoked, spiced, or pickled meats, seafood, or fish. Bacon, ham, sausage, hot dogs, corned beef, chipped beef, and packaged luncheon meats. Salt pork. Jerky. Pickled herring. Anchovies, regular canned tuna, and sardines. Salted nuts. Dairy Processed cheese and cheese spreads. Cheese curds. Blue cheese and cottage cheese. Buttermilk.  Condiments Onion and garlic salt, seasoned salt, table salt, and sea salt. Canned and packaged gravies. Worcestershire sauce. Tartar sauce. Barbecue sauce. Teriyaki sauce. Soy sauce, including reduced sodium. Steak sauce. Fish sauce. Oyster sauce. Cocktail sauce. Horseradish. Regular ketchup and mustard. Meat flavorings and tenderizers. Bouillon cubes. Hot sauce. Tabasco sauce. Marinades. Taco seasonings. Relishes. Fats and Oils Regular salad dressings. Salted butter. Margarine. Ghee. Bacon fat.  Other Potato and tortilla chips. Corn chips and puffs. Salted popcorn and pretzels. Canned or dried  soups. Pizza. Frozen entrees and pot pies.  The items listed above may not be a complete list of foods and beverages to avoid. Contact your dietitian for more information. Document Released: 03/12/2002 Document Revised: 09/25/2013 Document Reviewed: 07/25/2013 [facility] Patient Information 2015 [place], [place]. This information is not intended to replace advice given to you by your health care provider. Make sure you discuss any questions you have with your health care provider.

## 2015-06-12 NOTE — Telephone Encounter (Signed)
I spoke with [place] at [employer]. She said the patient picked up a free 30 day supply today with discount card from cards. She also said there isn't a generic for Eliquis at the current time. It is $500 for him because he's in the donut hole. So basically, he has 30 days for cards to figure out what to do next.

## 2015-06-13 ENCOUNTER — Other Ambulatory Visit: Payer: Self-pay

## 2015-06-13 ENCOUNTER — Ambulatory Visit (HOSPITAL_COMMUNITY): Payer: Medicare Other | Attending: Family Medicine

## 2015-06-13 DIAGNOSIS — I340 Nonrheumatic mitral (valve) insufficiency: Secondary | ICD-10-CM | POA: Insufficient documentation

## 2015-06-13 DIAGNOSIS — R0600 Dyspnea, unspecified: Secondary | ICD-10-CM | POA: Insufficient documentation

## 2015-06-13 DIAGNOSIS — I517 Cardiomegaly: Secondary | ICD-10-CM | POA: Insufficient documentation

## 2015-06-13 DIAGNOSIS — I4892 Unspecified atrial flutter: Secondary | ICD-10-CM | POA: Diagnosis not present

## 2015-06-13 DIAGNOSIS — I10 Essential (primary) hypertension: Secondary | ICD-10-CM | POA: Diagnosis not present

## 2015-06-13 DIAGNOSIS — R0601 Orthopnea: Secondary | ICD-10-CM | POA: Diagnosis not present

## 2015-06-13 DIAGNOSIS — Z87891 Personal history of nicotine dependence: Secondary | ICD-10-CM | POA: Diagnosis not present

## 2015-06-13 DIAGNOSIS — I5022 Chronic systolic (congestive) heart failure: Secondary | ICD-10-CM

## 2015-06-13 DIAGNOSIS — I509 Heart failure, unspecified: Secondary | ICD-10-CM | POA: Diagnosis not present

## 2015-06-13 DIAGNOSIS — E785 Hyperlipidemia, unspecified: Secondary | ICD-10-CM | POA: Diagnosis not present

## 2015-06-16 ENCOUNTER — Other Ambulatory Visit (HOSPITAL_COMMUNITY): Payer: Medicare Other

## 2015-06-18 ENCOUNTER — Other Ambulatory Visit: Payer: Self-pay | Admitting: Family Medicine

## 2015-06-18 ENCOUNTER — Encounter: Payer: Self-pay | Admitting: Family Medicine

## 2015-06-18 DIAGNOSIS — I350 Nonrheumatic aortic (valve) stenosis: Secondary | ICD-10-CM

## 2015-06-18 MED ORDER — METOPROLOL TARTRATE 25 MG PO TABS: 12.5000 mg | ORAL_TABLET | Freq: Two times a day (BID) | ORAL | Status: DC

## 2015-06-20 ENCOUNTER — Ambulatory Visit: Payer: Medicare Other | Admitting: Family Medicine

## 2015-06-20 ENCOUNTER — Telehealth: Payer: Self-pay

## 2015-06-20 NOTE — Telephone Encounter (Signed)
Good to hear. We decreased metoprolol to 1/2 tablet twice daily (with new lower dose at pharmacy). Also should only be on lasix 40mg  once daily. Continue lower dose as I think it has helped his breathing.  No other changes for now.

## 2015-06-20 NOTE — Telephone Encounter (Signed)
Scott pts son (DPR signed) left v/m; [name] wanted Dr Darnell Level to know that pts breathing is a lot better from how breathing last night; pt seems to feel much better today. Scott said when cb with further instructions  to call [name] because Inez Catalina pts wife gets info mixed up.

## 2015-06-20 NOTE — Telephone Encounter (Signed)
PLEASE NOTE: All timestamps contained within this report are represented as [place] Standard Time. CONFIDENTIALTY NOTICE: This fax transmission is intended only for the addressee. It contains information that is legally privileged, confidential or otherwise protected from use or disclosure. If you are not the intended recipient, you are strictly prohibited from reviewing, disclosing, copying using or disseminating any of this information or taking any action in reliance on or regarding this information. If you have received this fax in error, please notify us immediately by telephone so that we can arrange for its return to [place]. Phone: [phone], Toll-Free: [phone], Fax: [phone] Page: 1 of 2 Call Id: [id number] [place] Patient Name: Charles [name] Gender: Male DOB: Nov 24, 1931 Age: 81 Y 9 M 22 D Return Phone Number: [phone] (Primary) Address: City/State/Zip: [place] Client [place] Night - Client Client Site [place] Physician [name] Contact Type Call Call Type Triage / Clinical Caller Name scott Relationship To Patient Son Return Phone Number [phone] (Primary) Chief Complaint Paging or Request for Consult Initial Comment Caller states my dad is getting worse and is there away to get in touch with my dad's dr PreDisposition Go to ED Nurse Assessment Nurse: Angeline Slim, RN, [place] Date/Time (Eastern Time): 06/19/2015 8:40:11 PM Confirm and document reason for call. If symptomatic, describe symptoms. ---caller states his dad [place] of heart. Dr called tonight concerned about aorta. Mom has terminal brain cancer and gets things mixed up. Dad is out of breath and gotten worse. Breathing is worse but has been able to do a little bit. Has the patient traveled out of the country within the last 30 days? ---No Does the  patient require triage? ---Yes Related visit to physician within the last 2 weeks? ---Yes Does the PT have any chronic conditions? (i.e. diabetes, asthma, etc.) ---Yes List chronic conditions. ---cardiac illness DM HTN Guidelines Guideline Title Affirmed Question Affirmed Notes Nurse Date/Time (Eastern Time) Breathing Difficulty [1] MODERATE difficulty breathing (e.g., speaks in phrases, SOB even at rest, pulse 100-120) AND [2] NEWonset or WORSE than normal Zellers, RN, [place] 06/19/2015 8:45:56 PM Disp. Time Eilene Ghazi Time) Disposition Final User 06/19/2015 8:50:06 PM Go to ED Now Yes Zellers, RN, [place] Caller Understands: Yes PLEASE NOTE: All timestamps contained within this report are represented as [place] Standard Time. CONFIDENTIALTY NOTICE: This fax transmission is intended only for the addressee. It contains information that is legally privileged, confidential or otherwise protected from use or disclosure. If you are not the intended recipient, you are strictly prohibited from reviewing, disclosing, copying using or disseminating any of this information or taking any action in reliance on or regarding this information. If you have received this fax in error, please notify us immediately by telephone so that we can arrange for its return to [place]. Phone: [phone], Toll-Free: [phone], Fax: [phone] Page: 2 of 2 Call Id: [id number] Disagree/Comply: Comply Care Advice Given Per Guideline GO TO ED NOW: You need to be seen in the Emergency Department. Go to the ER at ___________ [place] now. Drive carefully. * Another adult should drive. BRING MEDICINES: * Please bring a list of your current medicines when you go to the Emergency Department (ER). * It is also a good idea to bring the pill bottles too. This will help the doctor to make certain you are taking the right medicines and the right dose. CALL EMS 911 IF: you become  worse. After Care Instructions Given Call  Event Type User Date / Time Description

## 2015-06-20 NOTE — Telephone Encounter (Signed)
Patient's son notified.

## 2015-06-20 NOTE — Telephone Encounter (Signed)
Noted. See next phone note.

## 2015-06-23 ENCOUNTER — Telehealth: Payer: Self-pay | Admitting: Cardiovascular Disease

## 2015-06-23 ENCOUNTER — Encounter: Payer: Self-pay | Admitting: Family Medicine

## 2015-06-23 ENCOUNTER — Telehealth: Payer: Self-pay | Admitting: Family Medicine

## 2015-06-23 NOTE — Telephone Encounter (Signed)
Spoke with Dr Oval Linsey, spoke with son. rec eval at ER tonight for persistent fatigue, dyspnea in setting of aflutter and mod-severe aortic stenosis.

## 2015-06-23 NOTE — Telephone Encounter (Signed)
Son says his breathing is still not great. Extremely fatigued-Going from BR to kitchen (15-18 steps) he is totally out of breath. No CP; still some slight swelling to his feet;  Does he need to be in the hospital in your opinion or is there anything else he could be doing at home prior to the cardioversion? He just wants to do everything he can for him.

## 2015-06-23 NOTE — Telephone Encounter (Signed)
Dr. Darnell Level called and asked that I call Dr. Oval Linsey and advise her that patient refuses to go to ER tonight, but is agreeable to go in the AM. Dr. Oval Linsey advised.

## 2015-06-23 NOTE — Telephone Encounter (Signed)
[name] was evaluated by Dr. [name] and fount to be still in atrial flutter with slow ventricular response.  He remains on Eliquis since 9/8.  Metoprolol was decreased due to bradycardia and fatigue.  He is reportedly in acute heart failure with mild LE edema.  The plan was initially to continue anticoagulation for 3 weeks and then proceed with DCCV.  However, given his symptoms and newly-discovered moderate-severe AS, Charles [name] will be admitted for TEE and DCCV.  Sharol Harness, MD 06/23/2015 4:23 PM

## 2015-06-23 NOTE — Telephone Encounter (Signed)
Scott called in wanting to give you an update on pt's condition. Please call [phone] thanks

## 2015-06-24 ENCOUNTER — Encounter (HOSPITAL_COMMUNITY): Payer: Self-pay | Admitting: Emergency Medicine

## 2015-06-24 ENCOUNTER — Emergency Department (HOSPITAL_COMMUNITY): Payer: Medicare Other

## 2015-06-24 ENCOUNTER — Observation Stay (HOSPITAL_COMMUNITY)
Admission: EM | Admit: 2015-06-24 | Discharge: 2015-06-26 | Disposition: A | Discharge status: Home / Self Care | Payer: Medicare Other | Attending: Cardiology | Admitting: Cardiology

## 2015-06-24 DIAGNOSIS — E119 Type 2 diabetes mellitus without complications: Secondary | ICD-10-CM | POA: Insufficient documentation

## 2015-06-24 DIAGNOSIS — I10 Essential (primary) hypertension: Secondary | ICD-10-CM | POA: Insufficient documentation

## 2015-06-24 DIAGNOSIS — R0602 Shortness of breath: Secondary | ICD-10-CM | POA: Diagnosis not present

## 2015-06-24 DIAGNOSIS — Z951 Presence of aortocoronary bypass graft: Secondary | ICD-10-CM | POA: Diagnosis not present

## 2015-06-24 DIAGNOSIS — Z794 Long term (current) use of insulin: Secondary | ICD-10-CM | POA: Diagnosis not present

## 2015-06-24 DIAGNOSIS — I350 Nonrheumatic aortic (valve) stenosis: Secondary | ICD-10-CM | POA: Diagnosis not present

## 2015-06-24 DIAGNOSIS — I509 Heart failure, unspecified: Secondary | ICD-10-CM | POA: Diagnosis not present

## 2015-06-24 DIAGNOSIS — J449 Chronic obstructive pulmonary disease, unspecified: Secondary | ICD-10-CM | POA: Insufficient documentation

## 2015-06-24 DIAGNOSIS — I4892 Unspecified atrial flutter: Principal | ICD-10-CM | POA: Insufficient documentation

## 2015-06-24 DIAGNOSIS — I2510 Atherosclerotic heart disease of native coronary artery without angina pectoris: Secondary | ICD-10-CM | POA: Diagnosis not present

## 2015-06-24 DIAGNOSIS — E785 Hyperlipidemia, unspecified: Secondary | ICD-10-CM | POA: Diagnosis not present

## 2015-06-24 DIAGNOSIS — Z87891 Personal history of nicotine dependence: Secondary | ICD-10-CM | POA: Diagnosis not present

## 2015-06-24 DIAGNOSIS — Z23 Encounter for immunization: Secondary | ICD-10-CM | POA: Insufficient documentation

## 2015-06-24 DIAGNOSIS — Z7901 Long term (current) use of anticoagulants: Secondary | ICD-10-CM | POA: Diagnosis not present

## 2015-06-24 HISTORY — DX: Unspecified atrial flutter: I48.92

## 2015-06-24 LAB — MAGNESIUM: Magnesium: 1.6 mg/dL — ABNORMAL LOW (ref 1.7–2.4)

## 2015-06-24 LAB — BASIC METABOLIC PANEL
ANION GAP: 7 (ref 5–15)
BUN: 14 mg/dL (ref 6–20)
CALCIUM: 9.6 mg/dL (ref 8.9–10.3)
CO2: 28 mmol/L (ref 22–32)
Chloride: 99 mmol/L — ABNORMAL LOW (ref 101–111)
Creatinine, Ser: 0.79 mg/dL (ref 0.61–1.24)
Glucose, Bld: 323 mg/dL — ABNORMAL HIGH (ref 65–99)
POTASSIUM: 4.5 mmol/L (ref 3.5–5.1)
SODIUM: 134 mmol/L — AB (ref 135–145)

## 2015-06-24 LAB — CBC WITH DIFFERENTIAL/PLATELET
BASOS ABS: 0 10*3/uL (ref 0.0–0.1)
BASOS PCT: 0 %
Eosinophils Absolute: 0.2 10*3/uL (ref 0.0–0.7)
Eosinophils Relative: 3 %
HEMATOCRIT: 40.9 % (ref 39.0–52.0)
Hemoglobin: 13.8 g/dL (ref 13.0–17.0)
LYMPHS PCT: 17 %
Lymphs Abs: 1.2 10*3/uL (ref 0.7–4.0)
MCH: 30.7 pg (ref 26.0–34.0)
MCHC: 33.7 g/dL (ref 30.0–36.0)
MCV: 90.9 fL (ref 78.0–100.0)
Monocytes Absolute: 0.8 10*3/uL (ref 0.1–1.0)
Monocytes Relative: 11 %
NEUTROS ABS: 4.9 10*3/uL (ref 1.7–7.7)
Neutrophils Relative %: 69 %
PLATELETS: 174 10*3/uL (ref 150–400)
RBC: 4.5 MIL/uL (ref 4.22–5.81)
RDW: 13.2 % (ref 11.5–15.5)
WBC: 7.1 10*3/uL (ref 4.0–10.5)

## 2015-06-24 LAB — TROPONIN I
TROPONIN I: 0.05 ng/mL — AB (ref <0.031)
TROPONIN I: 0.06 ng/mL — AB (ref <0.031)

## 2015-06-24 LAB — GLUCOSE, CAPILLARY
GLUCOSE-CAPILLARY: 235 mg/dL — AB (ref 65–99)
Glucose-Capillary: 306 mg/dL — ABNORMAL HIGH (ref 65–99)

## 2015-06-24 LAB — BRAIN NATRIURETIC PEPTIDE: B Natriuretic Peptide: 309 pg/mL — ABNORMAL HIGH (ref 0.0–100.0)

## 2015-06-24 LAB — I-STAT TROPONIN, ED: TROPONIN I, POC: 0.05 ng/mL (ref 0.00–0.08)

## 2015-06-24 MED ORDER — LORAZEPAM 0.5 MG PO TABS: 0.2500 mg | ORAL_TABLET | Freq: Two times a day (BID) | ORAL | Status: DC | PRN

## 2015-06-24 MED ORDER — ACETAMINOPHEN 325 MG PO TABS
650.0000 mg | ORAL_TABLET | ORAL | Status: DC | PRN
  Administered 2015-06-26: 650 mg via ORAL
  Filled 2015-06-24: qty 2

## 2015-06-24 MED ORDER — FUROSEMIDE 10 MG/ML IJ SOLN
40.0000 mg | Freq: Once | INTRAMUSCULAR | Status: AC
  Administered 2015-06-24: 40 mg via INTRAVENOUS
  Filled 2015-06-24: qty 4

## 2015-06-24 MED ORDER — APIXABAN 5 MG PO TABS
5.0000 mg | ORAL_TABLET | Freq: Two times a day (BID) | ORAL | Status: DC
  Administered 2015-06-24 – 2015-06-26 (×4): 5 mg via ORAL
  Filled 2015-06-24 (×4): qty 1

## 2015-06-24 MED ORDER — SODIUM CHLORIDE 0.9 % IV SOLN
INTRAVENOUS | Status: DC
  Administered 2015-06-24: 21:00:00 via INTRAVENOUS

## 2015-06-24 MED ORDER — INSULIN ASPART 100 UNIT/ML ~~LOC~~ SOLN
0.0000 [IU] | Freq: Three times a day (TID) | SUBCUTANEOUS | Status: DC
  Administered 2015-06-24: 5 [IU] via SUBCUTANEOUS
  Administered 2015-06-25: 3 [IU] via SUBCUTANEOUS
  Administered 2015-06-25: 8 [IU] via SUBCUTANEOUS
  Administered 2015-06-25: 5 [IU] via SUBCUTANEOUS
  Administered 2015-06-26: 2 [IU] via SUBCUTANEOUS

## 2015-06-24 MED ORDER — PANTOPRAZOLE SODIUM 40 MG PO TBEC
40.0000 mg | DELAYED_RELEASE_TABLET | Freq: Every day | ORAL | Status: DC
  Administered 2015-06-25 – 2015-06-26 (×2): 40 mg via ORAL
  Filled 2015-06-24 (×2): qty 1

## 2015-06-24 MED ORDER — LISINOPRIL 40 MG PO TABS
40.0000 mg | ORAL_TABLET | Freq: Every day | ORAL | Status: DC
  Administered 2015-06-25 – 2015-06-26 (×2): 40 mg via ORAL
  Filled 2015-06-24 (×2): qty 1

## 2015-06-24 MED ORDER — ATORVASTATIN CALCIUM 20 MG PO TABS
20.0000 mg | ORAL_TABLET | Freq: Every day | ORAL | Status: DC
  Administered 2015-06-24 – 2015-06-25 (×2): 20 mg via ORAL
  Filled 2015-06-24 (×2): qty 1

## 2015-06-24 MED ORDER — CITALOPRAM HYDROBROMIDE 20 MG PO TABS
20.0000 mg | ORAL_TABLET | Freq: Every day | ORAL | Status: DC
  Administered 2015-06-25 – 2015-06-26 (×2): 20 mg via ORAL
  Filled 2015-06-24 (×2): qty 1

## 2015-06-24 MED ORDER — POTASSIUM CHLORIDE CRYS ER 20 MEQ PO TBCR
20.0000 meq | EXTENDED_RELEASE_TABLET | Freq: Every day | ORAL | Status: DC
  Administered 2015-06-25 – 2015-06-26 (×2): 20 meq via ORAL
  Filled 2015-06-24 (×3): qty 1

## 2015-06-24 MED ORDER — TRAZODONE HCL 50 MG PO TABS
50.0000 mg | ORAL_TABLET | Freq: Every day | ORAL | Status: DC
  Administered 2015-06-24 – 2015-06-25 (×2): 50 mg via ORAL
  Filled 2015-06-24 (×2): qty 1

## 2015-06-24 MED ORDER — FUROSEMIDE 40 MG PO TABS
40.0000 mg | ORAL_TABLET | Freq: Every day | ORAL | Status: DC
  Administered 2015-06-25 – 2015-06-26 (×2): 40 mg via ORAL
  Filled 2015-06-24 (×2): qty 1

## 2015-06-24 MED ORDER — METOPROLOL TARTRATE 12.5 MG HALF TABLET
12.5000 mg | ORAL_TABLET | Freq: Two times a day (BID) | ORAL | Status: DC
Start: 2015-06-24 — End: 2015-06-24

## 2015-06-24 MED ORDER — INFLUENZA VAC SPLIT QUAD 0.5 ML IM SUSY
0.5000 mL | PREFILLED_SYRINGE | INTRAMUSCULAR | Status: AC
  Administered 2015-06-25: 0.5 mL via INTRAMUSCULAR
  Filled 2015-06-24: qty 0.5

## 2015-06-24 MED ORDER — ONDANSETRON HCL 4 MG/2ML IJ SOLN: 4.0000 mg | Freq: Four times a day (QID) | INTRAMUSCULAR | Status: DC | PRN

## 2015-06-24 NOTE — H&P (Signed)
Patient ID: BURKE TERRY MRN: [id number], DOB/AGE: 1932-05-14   Admit date: 06/24/2015   Primary Physician: [name], MD Primary Cardiologist: Dr. [name]   Pt. Profile:  Charles [name] is a 81 y.o. male with a history of CAD s/p CABG x4v (2004), bilateral RAS, carotid artery dz s/p L CEA, HTN, HLD and DM who presented to [facility] today with atrial flutter with slow VR.  He had CABG x 4 in 2004 with a LIMA to his LAD, a vein to the RCA, OM branch and ramus branch. He also was found to have a 70% bilateral renal artery stenosis which we have been following by duplex ultrasound. He has had left carotid endarterectomy which we follow as well. His other problems include hypertension, hyperlipidemia and diabetes for which he takes insulin. His last functional study performed 2/14 was nonischemic.  He was recently evaluated by his PCP, Dr. [name], for evaluation of shortness of breath and volume overload. EKG revealed atrial flutter with ventricular rates in the 60s. Subsequently Dr. [name] started him on Eliquis, 5 mg twice a day for stroke prophylaxis. Recent laboratory work demonstrated normal electrolytes and normal thyroid function test. BNP was elevated at 374. He was then seen by Ellen Henri PA-C on 06/12/15. He continued to have atrial flutter with SVR and s/s CHF. He was continued on lasix (which was increased to BID for a short time), metoprolol and Eliquis for plans for DCCV in 3 weeks if he remained in flutter. A 2D ECHO was ordered which revealed mild LVH, EF 65-70%, mod-sev AS, mild MR, mild LA dilation, mod RA dilation, PA pk pressure 40.   He has been feeling worse (very weak and fatigued) and dyspnea is worse. It has become a huge effort to ambulate. He has agreed to the cardioversion and asks that this be done ASAP. His follow up with cards was supposed to be 06/30/15 but he requested that it be moved up. It was recommended that he proceed to the ED for admission and  TEE/DCCV ( he has only been on eliquis for 1.5 weeks). He is breathing a little better but still with 4 pillow orthopnea, dyspnea and extreme fatigue.   Problem List  Past Medical History  Diagnosis Date  . Arthritis     in lower back  . History of chicken pox   . Depression   . T2DM (type 2 diabetes mellitus) 1995  . GERD (gastroesophageal reflux disease) 2003    h/o duodenal ulcer per EGD as well as esophagitis  . Seasonal allergies   . CAD (coronary artery disease) 2004    s/p 4v CABG (Dr. [name])  . HTN (hypertension)   . HLD (hyperlipidemia)   . Colon polyp 2005    (Dr. Tiffany Kocher)  . Urge incontinence of urine   . Renal artery stenosis 2004    70% bilateral, followed by cards  . Psoriasis   . COPD (chronic obstructive pulmonary disease) 03/2011    by xray  . Carotid stenosis 1999    s/p L CEA  . History of colon polyps 2003, 2005    adenomatous Vira Agar)  . Ex-smoker   . Compression fracture of L1 lumbar vertebra remote  . DDD (degenerative disc disease), lumbar 2014  . 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 Aortic stenosis 06/2014    moderate-severe    Past Surgical History  Procedure Laterality Date  . Tonsillectomy    . Carotid  endarterectomy  1999    left [facility])  . Coronary artery bypass graft  2004    4v CABG ([facility]Trigt) with LIMA to LAD, vein graft to RCA, 1st obtuse marginal, and ramus intermedius  . Colonoscopy  2003    colon polyp x3 - adenomatous Tiffany Kocher)  . Cardiovascular stress test  11/2010    normal perfusion, no evidence of ischemia, EF 62% post exercise  . Upper gastrointestinal endoscopy  2003    reflux esophagitis, erosive gastropathy, duodenal ulcer  . Cardiovascular stress test  11/28/2012    Mild diaphragmatic attenuation; cannot exclude a focal region of nontransmural inferior scar  . Cardiac catheterization  03/06/2003    No intervention - recommend CABG  . Coronary artery bypass graft  03/07/2003    x4. LIMA to  LAD, SVG to ramus intermediate, SVG to OM, and SVG to RCA.  [name] Renal doppler  11/29/2011    Celiac&SMA-demonstrated vessel narrowing suggestive of a greater than 50% diameter reduction. Bilateral renal arteries-demonstrated vessel narrowing of 60-99% diameter reduction. Rt Kidney-mid pole lateral simple cyst noted measuring 1.29x0.76x1.11cm and exophytic cyst outside lower pole measuring 1.23x0.96x1.31. Lft Kidney-lateral mid to lower pole simple cyst measuering-1.24x9.83x1.24  . Cataract extraction Bilateral 01/2013    [facility]  . Colonoscopy  10/08/2012    2 TA, diverticulosis, int hem, no rpt rec Tiffany Kocher)  . Esophagogastroduodenoscopy  10/08/2012    nl esophagus, duodenitis and erosive gastropathy, path - gastropathy no Hpylori, no rpt rec     Allergies  Allergies  Allergen Reactions  . Amlodipine Other (See Comments)    Edema  . Other Other (See Comments)    myalgias Horse serum Horse serum  . Rosiglitazone Other (See Comments)    REACTION: Did not help  . Rosiglitazone Maleate     REACTION: Did not help  . Tricor [Fenofibrate] Other (See Comments)    myalgias     Home Medications  Prior to Admission medications   Medication Sig Start Date End Date Taking? Authorizing Provider  apixaban (ELIQUIS) 5 MG TABS tablet Take 1 tablet (5 mg total) by mouth 2 (two) times daily. 06/12/15  Yes Brittainy Erie Noe, PA-C  atorvastatin (LIPITOR) 20 MG tablet Take 20 mg by mouth daily at 6 PM.   Yes Historical Provider, MD  BAYER MICROLET LANCETS lancets Ck blood sugar three times daily and as directed. Dx 250.00. Insulin dependent 06/19/13  Yes [name], MD  citalopram (CELEXA) 20 MG tablet Take 1 tablet (20 mg total) by mouth daily. 06/11/15  Yes [name], MD  Cyanocobalamin (B-12) 500 MCG TABS Take 1 tablet by mouth daily.   Yes Historical Provider, MD  furosemide (LASIX) 40 MG tablet Take 1 tablet (40 mg total) by mouth daily. 06/18/15  Yes [name], MD  glucose blood (BAYER  CONTOUR NEXT TEST) test strip Ck blood sugar three times daily and as directed. Dx 250.00 insulin dependent 06/19/13  Yes [name], MD  insulin glargine (LANTUS) 100 UNIT/ML injection Inject 0.28 mLs (28 Units total) into the skin at bedtime. Patient taking differently: Inject 26 Units into the skin at bedtime.  08/20/13  Yes [name], MD  Insulin Syringe-Needle U-100 (B-D INS SYR ULTRAFINE 1CC/30G) 30G X 1/2" 1 ML MISC 1 Syringe by Does not apply route as directed. 02/04/15  Yes [name], MD  lisinopril (PRINIVIL,ZESTRIL) 40 MG tablet Take 1 tablet (40 mg total) by mouth daily. 02/12/15  Yes [name], MD  LORazepam (ATIVAN) 0.5 MG tablet Take 0.5-1  tablets (0.25-0.5 mg total) by mouth 2 (two) times daily as needed for anxiety. 06/06/15  Yes [name], MD  metFORMIN (GLUCOPHAGE) 1000 MG tablet Take 1 tablet (1,000 mg total) by mouth 2 (two) times daily with a meal. 04/08/15  Yes [name], MD  metoprolol (LOPRESSOR) 25 MG tablet Take 0.5 tablets (12.5 mg total) by mouth 2 (two) times daily. 06/18/15  Yes [name], MD  omeprazole (PRILOSEC) 20 MG capsule Take 1 capsule (20 mg total) by mouth daily. 02/04/15  Yes [name], MD  potassium chloride SA (K-DUR,KLOR-CON) 20 MEQ tablet Take 20 mEq by mouth daily.  06/07/15  Yes [name], MD  saw palmetto 160 MG capsule Take 450 mg by mouth daily.    Yes Historical Provider, MD  traZODone (DESYREL) 50 MG tablet Take 1 tablet (50 mg total) by mouth at bedtime. 07/23/14  Yes [name], MD  vitamin C (ASCORBIC ACID) 500 MG tablet Take 500 mg by mouth daily.   Yes Historical Provider, MD    Family History  Family History  Problem Relation Age of Onset  . Hypertension Father   . Heart disease Father   . Cancer Mother     GI cancer  . Diabetes Sister   . Heart disease Sister   . Stroke Sister   . Hypertension Sister   . Cancer Brother     Lung cancer - nonsmoker  . Cancer Maternal  Grandmother   . Heart attack Maternal Grandfather   . Heart attack Paternal Grandmother   . Heart attack Paternal Grandfather   . Cancer Brother     Pancreatic cancer  . Lung disease Brother    Family Status  Relation Status Death Age  . Father Deceased 44  . Mother Deceased 74  . Sister Alive   . Brother Deceased 37  . Maternal Grandmother Deceased   . Maternal Grandfather Deceased   . Paternal Grandmother Deceased   . Paternal Grandfather Deceased   . Brother Deceased 48  . Brother Deceased 10     Social History  Social History   Social History  . Marital Status: Married    Spouse Name: N/A  . Number of Children: N/A  . Years of Education: N/A   Occupational History  . Not on file.   Social History Main Topics  . Smoking status: Former Smoker -- 0.10 packs/day    Types: Cigarettes    Quit date: 07/23/2013  . Smokeless tobacco: Never Used     Comment: 1/2 ppd  . Alcohol Use: No  . Drug Use: No  . Sexual Activity: Not on file   Other Topics Concern  . Not on file   Social History Narrative   Caffeine: 4 cups coffee/day, some tea and soda   Lives with wife   Occupation: Retired   Activity: no regular exercise   Diet: good water, fruits/vegetables daily      All other systems reviewed and are otherwise negative except as noted above.  Physical Exam  Blood pressure 151/61, pulse 45, temperature 97.5 F (36.4 C), temperature source Oral, resp. rate 18, SpO2 95 %.  General: Pleasant, NAD Psych: Normal affect. Neuro: Alert and oriented X 3. Moves all extremities spontaneously. HEENT: Normal  Neck: Supple without bruits. Mild JVD. Lungs:  Resp regular and unlabored, CTA. Heart: brady, irregular. no s3, s4, SEM @ loudest at RUSB. Abdomen: Soft, non-tender, non-distended, BS + x 4.  Extremities: No clubbing, cyanosis. 1+ Bilateral edema. DP/PT/Radials 2+ and  equal bilaterally.  Labs  No results for input(s): CKTOTAL, CKMB, TROPONINI in the last 72  hours. Lab Results  Component Value Date   WBC 7.1 06/24/2015   HGB 13.8 06/24/2015   HCT 40.9 06/24/2015   MCV 90.9 06/24/2015   PLT 174 06/24/2015    Recent Labs Lab 06/24/15 1045  NA 134*  K 4.5  CL 99*  CO2 28  BUN 14  CREATININE 0.79  CALCIUM 9.6  GLUCOSE 323*   Lab Results  Component Value Date   CHOL 148 10/08/2014   HDL 31.40* 10/08/2014   LDLCALC 85 10/08/2014   TRIG 156.0* 10/08/2014   No results found for: DDIMER   Radiology/Studies  Dg Chest 2 View  06/24/2015   CLINICAL DATA:  Shortness of breath for several days.  EXAM: CHEST  2 VIEW  COMPARISON:  June 06, 2015.  FINDINGS: The heart size and mediastinal contours are within normal limits. Status post coronary artery bypass graft. No pneumothorax or significant pleural effusion is noted. No acute pulmonary disease is noted. Multilevel degenerative disc disease is noted in the lower thoracic spine.  IMPRESSION: No active cardiopulmonary disease.   Electronically Signed   By: Marijo Conception, M.D.   On: 06/24/2015 10:45   Dg Chest 2 View  06/06/2015   CLINICAL DATA:  Chronic systolic heart failure, orthopnea  EXAM: CHEST  2 VIEW  COMPARISON:  PA and lateral chest x-ray of July 18, 2004  FINDINGS: The lungs are adequately inflated. There is a small amount of pleural fluid blunting the costophrenic angles. The heart is top-normal in size. The pulmonary vascularity is normal. There are post CABG changes. There is tortuosity of the descending thoracic aorta. There is mild multilevel degenerative disc disease.  IMPRESSION: There are new small bilateral pleural effusions blunting the costophrenic angles. There is no pulmonary edema or pneumonia.   Electronically Signed   By: David  Martinique M.D.   On: 06/06/2015 12:33     2D ECHO: 06/13/2015 Study Conclusions - Left ventricle: The cavity size was normal. Wall thickness was increased in a pattern of mild LVH. Systolic function was vigorous. The estimated  ejection fraction was in the range of 65% to 70%. - Aortic valve: AV is thickened, calcified with restricted motion. Peak and mean gradients through the valve are 49 and 28 mm Hg consistent with moderate AS. 2D imaging also suggests that AS is moderate to severe. . - Mitral valve: Calcified annulus. Mildly thickened leaflets . There was mild regurgitation. - Left atrium: The atrium was mildly dilated. - Right ventricle: The cavity size was mildly dilated. Wall thickness was normal. - Right atrium: The atrium was moderately dilated. - Pulmonary arteries: PA peak pressure: 40 mm Hg (S). Impressions: - Patient in atrial flutter with up to 6:1 block during study HR 34 to 45 bpm   ECG  Atrial flutter with variable A-V block HR 52  ASSESSMENT AND PLAN   UVALDO RYBACKI is a 81 y.o. male with a history of CAD s/p CABG x4v (2004), bilateral RAS, carotid artery dz s/p L CEA, HTN, HLD and DM who presented to [facility] today with atrial flutter with slow VR and dyspnea.  Atrial Flutter: EKG today continues to show atrial flutter with a SVR.  --TSH and electrolytes are normal.  -- 2D echo mild LVH, EF 65-70%, mod-sev AS, mild MR, mild LA dilation, mod RA dilation, PA pk pressure 40.  -- He is highly symptomatic and having issues with  volume overload in the setting of mod-severe AS. Will set him up for TEE/DCCV since he has not been on at least 3 weeks of Eliquis.  -- Continue lopressor 12.5mg  BID and eliquis 5mg  BID  Aortic stenosis- 2D ECHO on 06/13/15: AV is thickened, calcified with restricted motion. Peak and mean gradients through the valve are 49 and 28 mm Hg consistent with moderate AS.  Acute CHF: in the setting of new atrial flutter and mod-severe AS. Will give one dose if IV lasix 40mg  and then resume 40mg  po qd.  -- Strict I/Os. Daily weights  CAD: h/o CABG x 4 in 2004 with low risk myoview in 2014. He denies any recent angina. Continue medical therapy.   Bilateral  Carotid Artery Disease: s/p eft carotid endarterectomy. Asymptomatic. Dopplers followed yearly by Dr. [name]. Last assessment showed stable disease.   Bilateral RAS: followed yearly by renal dopplers. Most recent study 07/2014 showed60-99% diameter reduction bilaterally by renal/aorta ratio.   HTN: controlled on current regimen.   HLD: on atorvastatin 20 mg daily.   DM: on insulin. Most recent Hgb A1c near goal at 7.2. Hold Metformin while inpatient and place on SSI    Renea Ee 06/24/2015, 12:18 PM  Pager [phone]

## 2015-06-24 NOTE — ED Notes (Signed)
Pt here for increased SOB x several days; pt with hx of aflutter; pt sts increased SOB worse with exertion or laying flat

## 2015-06-25 ENCOUNTER — Encounter (HOSPITAL_COMMUNITY): Payer: Self-pay | Admitting: Internal Medicine

## 2015-06-25 ENCOUNTER — Observation Stay (HOSPITAL_COMMUNITY): Payer: Medicare Other | Admitting: Anesthesiology

## 2015-06-25 ENCOUNTER — Observation Stay (HOSPITAL_BASED_OUTPATIENT_CLINIC_OR_DEPARTMENT_OTHER): Payer: Medicare Other

## 2015-06-25 ENCOUNTER — Encounter (HOSPITAL_COMMUNITY)
Admission: EM | Disposition: A | Discharge status: Home / Self Care | Payer: Self-pay | Source: Home / Self Care | Attending: Emergency Medicine

## 2015-06-25 DIAGNOSIS — I4892 Unspecified atrial flutter: Secondary | ICD-10-CM

## 2015-06-25 DIAGNOSIS — I352 Nonrheumatic aortic (valve) stenosis with insufficiency: Secondary | ICD-10-CM

## 2015-06-25 DIAGNOSIS — I2510 Atherosclerotic heart disease of native coronary artery without angina pectoris: Secondary | ICD-10-CM | POA: Diagnosis not present

## 2015-06-25 DIAGNOSIS — I350 Nonrheumatic aortic (valve) stenosis: Secondary | ICD-10-CM | POA: Diagnosis not present

## 2015-06-25 DIAGNOSIS — I10 Essential (primary) hypertension: Secondary | ICD-10-CM | POA: Diagnosis not present

## 2015-06-25 DIAGNOSIS — E119 Type 2 diabetes mellitus without complications: Secondary | ICD-10-CM | POA: Diagnosis not present

## 2015-06-25 HISTORY — PX: TEE WITHOUT CARDIOVERSION: SHX5443

## 2015-06-25 HISTORY — PX: CARDIOVERSION: SHX1299

## 2015-06-25 LAB — CBC
HEMATOCRIT: 39.1 % (ref 39.0–52.0)
HEMOGLOBIN: 12.9 g/dL — AB (ref 13.0–17.0)
MCH: 30 pg (ref 26.0–34.0)
MCHC: 33 g/dL (ref 30.0–36.0)
MCV: 90.9 fL (ref 78.0–100.0)
Platelets: 164 10*3/uL (ref 150–400)
RBC: 4.3 MIL/uL (ref 4.22–5.81)
RDW: 13.1 % (ref 11.5–15.5)
WBC: 6.5 10*3/uL (ref 4.0–10.5)

## 2015-06-25 LAB — GLUCOSE, CAPILLARY
GLUCOSE-CAPILLARY: 171 mg/dL — AB (ref 65–99)
GLUCOSE-CAPILLARY: 156 mg/dL — AB (ref 65–99)
Glucose-Capillary: 225 mg/dL — ABNORMAL HIGH (ref 65–99)
Glucose-Capillary: 275 mg/dL — ABNORMAL HIGH (ref 65–99)

## 2015-06-25 LAB — LIPID PANEL
CHOLESTEROL: 120 mg/dL (ref 0–200)
HDL: 32 mg/dL — ABNORMAL LOW (ref >40)
LDL Cholesterol: 69 mg/dL (ref 0–99)
Total CHOL/HDL Ratio: 3.8 RATIO
Triglycerides: 95 mg/dL (ref <150)
VLDL: 19 mg/dL (ref 0–40)

## 2015-06-25 LAB — BASIC METABOLIC PANEL
ANION GAP: 8 (ref 5–15)
BUN: 12 mg/dL (ref 6–20)
CO2: 30 mmol/L (ref 22–32)
Calcium: 9.4 mg/dL (ref 8.9–10.3)
Chloride: 100 mmol/L — ABNORMAL LOW (ref 101–111)
Creatinine, Ser: 0.75 mg/dL (ref 0.61–1.24)
GFR calc Af Amer: >60 mL/min (ref >60)
GLUCOSE: 244 mg/dL — AB (ref 65–99)
POTASSIUM: 3.6 mmol/L (ref 3.5–5.1)
SODIUM: 138 mmol/L (ref 135–145)

## 2015-06-25 LAB — TROPONIN I: TROPONIN I: 0.06 ng/mL — AB (ref <0.031)

## 2015-06-25 LAB — PROTIME-INR
INR: 1.39 (ref 0.00–1.49)
Prothrombin Time: 17.2 seconds — ABNORMAL HIGH (ref 11.6–15.2)

## 2015-06-25 SURGERY — CARDIOVERSION
Anesthesia: Monitor Anesthesia Care

## 2015-06-25 MED ORDER — LIDOCAINE HCL (CARDIAC) 20 MG/ML IV SOLN
INTRAVENOUS | Status: DC | PRN
  Administered 2015-06-25: 30 mg via INTRAVENOUS

## 2015-06-25 MED ORDER — PROPOFOL 10 MG/ML IV BOLUS
INTRAVENOUS | Status: DC | PRN
Start: 2015-06-25 — End: 2015-06-25
  Administered 2015-06-25 (×5): 20 mg via INTRAVENOUS

## 2015-06-25 MED ORDER — POTASSIUM CHLORIDE CRYS ER 20 MEQ PO TBCR
40.0000 meq | EXTENDED_RELEASE_TABLET | Freq: Once | ORAL | Status: AC
  Administered 2015-06-25: 40 meq via ORAL
  Filled 2015-06-25: qty 2

## 2015-06-25 MED ORDER — LIDOCAINE VISCOUS 2 % MT SOLN
OROMUCOSAL | Status: DC | PRN
  Administered 2015-06-25: 1 via OROMUCOSAL

## 2015-06-25 MED ORDER — LACTATED RINGERS IV SOLN
INTRAVENOUS | Status: DC | PRN
  Administered 2015-06-25: 13:00:00 via INTRAVENOUS

## 2015-06-25 MED ORDER — LIDOCAINE VISCOUS 2 % MT SOLN
OROMUCOSAL | Status: AC
  Filled 2015-06-25: qty 15

## 2015-06-25 MED ORDER — BUTAMBEN-TETRACAINE-BENZOCAINE 2-2-14 % EX AERO
INHALATION_SPRAY | CUTANEOUS | Status: DC | PRN
Start: 2015-06-25 — End: 2015-06-25
  Administered 2015-06-25: 2 via TOPICAL

## 2015-06-25 NOTE — Anesthesia Preprocedure Evaluation (Addendum)
Anesthesia Evaluation  Patient identified by MRN, date of birth, ID band Patient awake    Reviewed: Allergy & Precautions, H&P , NPO status , Patient's Chart, lab work & pertinent test results  Airway Mallampati: II  TM Distance: >3 FB Neck ROM: full    Dental  (+) Edentulous Upper, Edentulous Lower, Upper Dentures, Lower Dentures, Dental Advidsory Given   Pulmonary COPD, former smoker,    breath sounds clear to auscultation       Cardiovascular hypertension, Pt. on medications and Pt. on home beta blockers + CAD, + CABG, +CHF and + Orthopnea  + dysrhythmias Atrial Fibrillation + Valvular Problems/Murmurs AS  Rhythm:Irregular Rate:Normal     Neuro/Psych PSYCHIATRIC DISORDERS    GI/Hepatic GERD  Medicated and Controlled,  Endo/Other  diabetes, Type 2  Renal/GU      Musculoskeletal  (+) Arthritis ,   Abdominal   Peds  Hematology  (+) anemia ,   Anesthesia Other Findings   Reproductive/Obstetrics                           Anesthesia Physical Anesthesia Plan  ASA: III  Anesthesia Plan: MAC   Post-op Pain Management:    Induction: Intravenous  Airway Management Planned: Natural Airway and Nasal Cannula  Additional Equipment:   Intra-op Plan:   Post-operative Plan:   Informed Consent: I have reviewed the patients History and Physical, chart, labs and discussed the procedure including the risks, benefits and alternatives for the proposed anesthesia with the patient or authorized representative who has indicated his/her understanding and acceptance.   Dental Advisory Given  Plan Discussed with: Anesthesiologist, CRNA and Surgeon  Anesthesia Plan Comments:        Anesthesia Quick Evaluation

## 2015-06-25 NOTE — H&P (Signed)
     INTERVAL PROCEDURE H&P  History and Physical Interval Note:  06/25/2015 12:21 PM  GRAVES NIPP has presented today for their planned procedure. The various methods of treatment have been discussed with the patient and family. After consideration of risks, benefits and other options for treatment, the patient has consented to the procedure.  The patients' outpatient history has been reviewed, patient examined, and no change in status from most recent office note within the past 30 days. I have reviewed the patients' chart and labs and will proceed as planned. Questions were answered to the patient's satisfaction.   Pixie Casino, MD, [facility] Attending Cardiologist [place] 06/25/2015, 12:21 PM

## 2015-06-25 NOTE — CV Procedure (Signed)
TEE/CARDIOVERSION NOTE  TRANSESOPHAGEAL ECHOCARDIOGRAM (TEE):  Indictation: Atrial Flutter  Consent:   Informed consent was obtained prior to the procedure. The risks, benefits and alternatives for the procedure were discussed and the patient comprehended these risks.  Risks include, but are not limited to, cough, sore throat, vomiting, nausea, somnolence, esophageal and stomach trauma or perforation, bleeding, low blood pressure, aspiration, pneumonia, infection, trauma to the teeth and death.    Time Out: Verified patient identification, verified procedure, site/side was marked, verified correct patient position, special equipment/implants available, medications/allergies/relevent history reviewed, required imaging and test results available. Performed  Procedure:  After a procedural time-out, the patient was given propofol per anesthesia for sedation.  The oropharynx was anesthetized 10 cc of topical 1% viscous lidocaine and 2 cetacaine sprays.  The transesophageal probe was inserted in the esophagus and stomach without difficulty and multiple views were obtained.  The patient was kept under observation until the patient left the procedure room.  The patient left the procedure room in stable condition.   Agitated microbubble saline contrast was not administered.  Complications:    Complications: None Patient did tolerate procedure well.  Findings:  1. LEFT VENTRICLE: The left ventricular wall thickness is mildly increased.  The left ventricular cavity is normal in size. Wall motion is normal.  LVEF is 65%.  2. RIGHT VENTRICLE:  The right ventricle is normal in structure and function without any thrombus or masses.    3. LEFT ATRIUM:  The left atrium is dilated in size without any thrombus or masses.  There is not spontaneous echo contrast ("smoke") in the left atrium consistent with a low flow state.  4. LEFT ATRIAL APPENDAGE:  The left atrial appendage is free of any  thrombus or masses. The appendage has single lobes. Pulse doppler indicates moderate flow in the appendage.  5. ATRIAL SEPTUM:  The atrial septum appears intact and is free of thrombus and/or masses.  There is no evidence for interatrial shunting by color doppler.  6. RIGHT ATRIUM:  The right atrium is normal in size and function without any thrombus or masses.  7. MITRAL VALVE:  The mitral valve is normal in structure and function with Mild regurgitation.  There were no vegetations or stenosis.  8. AORTIC VALVE:  The aortic valve is trileaflet and moderately calcified. The left and right coronary cusps are partially fused. There is moderate aortic stenosis - AVA by planimetry is around 1.3 cm2. There is no regurgitation.  There were no vegetations.  9. TRICUSPID VALVE:  The tricuspid valve is normal in structure and function with mild to moderate regurgitation. RVSP is 36 mmHg + RAP. There were no vegetations or stenosis  10.  PULMONIC VALVE:  The pulmonic valve is normal in structure and function with trivial regurgitation.  There were no vegetations or stenosis.   11. AORTIC ARCH, ASCENDING AND DESCENDING AORTA:  There was grade 1 Ron Parker et. Al, 1992) atherosclerosis of the ascending aorta, aortic arch, or proximal descending aorta.  12. PULMONARY VEINS: Anomalous pulmonary venous return was not noted.  13. PERICARDIUM: The pericardium appeared normal and non-thickened.  There is no pericardial effusion.  CARDIOVERSION:     Second Time Out: Verified patient identification, verified procedure, site/side was marked, verified correct patient position, special equipment/implants available, medications/allergies/relevent history reviewed, required imaging and test results available.  Performed  Procedure:  1. Patient placed on cardiac monitor, pulse oximetry, supplemental oxygen as necessary.  2. Sedation administered per anesthesia 3. Pacer  pads placed anterior and posterior  chest. 4. Cardioverted 1 time(s).  5. Cardioverted at 120J biphasic.  Complications:  Complications: None Patient did tolerate procedure well.  Impression:  1. No LAA thrombus 2. Moderate aortic valve stenosis by planimetry - AVA around 1.3 cm2. 3. Mild to moderate TR - RVSP is 36 mmHg + RAP 4. LVEF 65% 5. Successful DCCV to NSR with a single 120J biphasic shock.  Recommendations:  1.  Possible DC later today per cardiology service.  Time Spent Directly with the Patient:  30 minutes   Charles Casino, MD, [facility] Attending Cardiologist [facility]  06/25/2015, 1:27 PM

## 2015-06-25 NOTE — Plan of Care (Signed)
Problem: Phase I Progression Outcomes Goal: Pain controlled with appropriate interventions Outcome: Completed/Met Date Met:  06/25/15 No complaints of pain

## 2015-06-25 NOTE — Progress Notes (Signed)
Talked with Dr. Debara Pickett about irregular heart rhythm. Order for EKG obtained.

## 2015-06-25 NOTE — Transfer of Care (Signed)
Immediate Anesthesia Transfer of Care Note  Patient: Charles [name]  Procedure(s) Performed: Procedure(s): CARDIOVERSION (N/A) TRANSESOPHAGEAL ECHOCARDIOGRAM (TEE) (N/A)  Patient Location: PACU  Anesthesia Type:MAC  Level of Consciousness: awake, alert  and oriented  Airway & Oxygen Therapy: Patient Spontanous Breathing and Patient connected to nasal cannula oxygen  Post-op Assessment: Report given to RN, Post -op Vital signs reviewed and stable and Patient moving all extremities X 4  Post vital signs: Reviewed and stable  Last Vitals:  Filed Vitals:   06/25/15 1324  BP: 141/93  Pulse:   Temp:   Resp:     Complications: No apparent anesthesia complications

## 2015-06-25 NOTE — Anesthesia Postprocedure Evaluation (Signed)
  Anesthesia Post-op Note  Patient: Charles [name]  Procedure(s) Performed: Procedure(s): CARDIOVERSION (N/A) TRANSESOPHAGEAL ECHOCARDIOGRAM (TEE) (N/A)  Patient Location: PACU  Anesthesia Type:MAC  Level of Consciousness: awake and alert   Airway and Oxygen Therapy: Patient Spontanous Breathing  Post-op Pain: none  Post-op Assessment: Post-op Vital signs reviewed              Post-op Vital Signs: Reviewed  Last Vitals:  Filed Vitals:   06/25/15 1350  BP: 155/84  Pulse: 85  Temp:   Resp: 19    Complications: No apparent anesthesia complications

## 2015-06-25 NOTE — Progress Notes (Signed)
*  PRELIMINARY RESULTS* Echocardiogram Echocardiogram Transesophageal has been performed.  Leavy Cella 06/25/2015, 1:53 PM

## 2015-06-25 NOTE — Progress Notes (Signed)
Patient Name: Charles [name] Date of Encounter: 06/25/2015  Primary Cardiologist: Dr. [name]    Active Problems:   Atrial flutter    SUBJECTIVE  Denies SOB and CP. Feeling well.  CURRENT MEDS . apixaban  5 mg Oral BID  . atorvastatin  20 mg Oral q1800  . citalopram  20 mg Oral Daily  . furosemide  40 mg Oral Daily  . Influenza vac split quadrivalent PF  0.5 mL Intramuscular Tomorrow-1000  . insulin aspart  0-15 Units Subcutaneous TID WC  . lisinopril  40 mg Oral Daily  . pantoprazole  40 mg Oral Daily  . potassium chloride SA  20 mEq Oral Daily  . traZODone  50 mg Oral QHS    OBJECTIVE  Filed Vitals:   06/24/15 1956 06/25/15 0046 06/25/15 0455 06/25/15 0800  BP: 160/83 168/84 152/85 142/54  Pulse: 74 65 84 70  Temp: 97.8 F (36.6 C) 97.9 F (36.6 C) 97.5 F (36.4 C) 98.2 F (36.8 C)  TempSrc: Oral Oral Oral Oral  Resp: 18 18 18 18   Height: 6\' 1"  (1.854 m)     Weight: 191 lb (86.637 kg)  189 lb 4.8 oz (85.866 kg)   SpO2: 97% 93% 93% 95%    Intake/Output Summary (Last 24 hours) at 06/25/15 0947 Last data filed at 06/25/15 0569  Gross per 24 hour  Intake    480 ml  Output    850 ml  Net   -370 ml   Filed Weights   06/24/15 1956 06/25/15 0455  Weight: 191 lb (86.637 kg) 189 lb 4.8 oz (85.866 kg)    PHYSICAL EXAM  General: Pleasant, NAD. Neuro: Alert and oriented X 3. Moves all extremities spontaneously. Psych: Normal affect. HEENT:  Normal  Neck: Supple without bruits or JVD. Lungs:  Resp regular and unlabored, CTA. Heart: irregular. no s3, s4, or murmurs. Abdomen: Soft, non-tender, non-distended, BS + x 4.  Extremities: No clubbing, cyanosis or edema. DP/PT/Radials 2+ and equal bilaterally.  Accessory Clinical Findings  CBC  Recent Labs  06/24/15 1045 06/25/15 0332  WBC 7.1 6.5  NEUTROABS 4.9  --   HGB 13.8 12.9*  HCT 40.9 39.1  MCV 90.9 90.9  PLT 174 794   Basic Metabolic Panel  Recent Labs  06/24/15 1045 06/24/15 1655  06/25/15 0332  NA 134*  --  138  K 4.5  --  3.6  CL 99*  --  100*  CO2 28  --  30  GLUCOSE 323*  --  244*  BUN 14  --  12  CREATININE 0.79  --  0.75  CALCIUM 9.6  --  9.4  MG  --  1.6*  --    Cardiac Enzymes  Recent Labs  06/24/15 1655 06/24/15 2207 06/25/15 0332  TROPONINI 0.05* 0.06* 0.06*   Fasting Lipid Panel  Recent Labs  06/25/15 0332  CHOL 120  HDL 32*  LDLCALC 69  TRIG 95  CHOLHDL 3.8    TELE Aflutter with HR 40-80s    ECG  No new EKG  Echocardiogram 06/13/2015  LV EF: 65% -  70%  ------------------------------------------------------------------- Indications:   CHF (I50.22). Dyspnea (R06.01). Dypnea (R06.00).  ------------------------------------------------------------------- History:  PMH: Spoke with DOD, Dr. Angelena Form regarding heart rate. Stated okay to discharge patient if asymptomatic. Acquired from the patient and from the patient&'s chart. Orthopnea, dyspnea, murmur, and bilateral lower extremity edema. Atrial flutter. Coronary artery disease. Congestive heart failure. Cerebrovascular disease. Risk factors: Former tobacco use. Hypertension. Dyslipidemia.  -------------------------------------------------------------------  Study Conclusions  - Left ventricle: The cavity size was normal. Wall thickness was increased in a pattern of mild LVH. Systolic function was vigorous. The estimated ejection fraction was in the range of 65% to 70%. - Aortic valve: AV is thickened, calcified with restricted motion. Peak and mean gradients through the valve are 49 and 28 mm Hg consistent with moderate AS. 2D imaging also suggests that AS is moderate to severe. . - Mitral valve: Calcified annulus. Mildly thickened leaflets . There was mild regurgitation. - Left atrium: The atrium was mildly dilated. - Right ventricle: The cavity size was mildly dilated. Wall thickness was normal. - Right atrium: The atrium was  moderately dilated. - Pulmonary arteries: PA peak pressure: 40 mm Hg (S).  Impressions:  - Patient in atrial flutter with up to 6:1 block during study HR 34 to 45 bpm     Radiology/Studies  Dg Chest 2 View  06/24/2015   CLINICAL DATA:  Shortness of breath for several days.  EXAM: CHEST  2 VIEW  COMPARISON:  June 06, 2015.  FINDINGS: The heart size and mediastinal contours are within normal limits. Status post coronary artery bypass graft. No pneumothorax or significant pleural effusion is noted. No acute pulmonary disease is noted. Multilevel degenerative disc disease is noted in the lower thoracic spine.  IMPRESSION: No active cardiopulmonary disease.   Electronically Signed   By: Marijo Conception, M.D.   On: 06/24/2015 10:45   Dg Chest 2 View  06/06/2015   CLINICAL DATA:  Chronic systolic heart failure, orthopnea  EXAM: CHEST  2 VIEW  COMPARISON:  PA and lateral chest x-ray of July 18, 2004  FINDINGS: The lungs are adequately inflated. There is a small amount of pleural fluid blunting the costophrenic angles. The heart is top-normal in size. The pulmonary vascularity is normal. There are post CABG changes. There is tortuosity of the descending thoracic aorta. There is mild multilevel degenerative disc disease.  IMPRESSION: There are new small bilateral pleural effusions blunting the costophrenic angles. There is no pulmonary edema or pneumonia.   Electronically Signed   By: David  Martinique M.D.   On: 06/06/2015 12:33    ASSESSMENT AND PLAN  Charles [name] is a 81 y.o. male with a history of CAD s/p CABG x4v (2004), bilateral RAS, carotid artery dz s/p L CEA, HTN, HLD and DM who presented to [facility] today with atrial flutter with slow VR.  1. Atrial flutter with SVR  - recently started on eliquis, been on eliquis for 1.5 weeks  - 2D echo mild LVH, EF 65-70%, mod-sev AS, mild MR, mild LA dilation, mod RA dilation, PA pk pressure 40.   - very symptomatic and having issues with volume  overload in the setting of mod-severe AS.   - Continue metoprolol. On schedule for TEE DCCV today at 1PM by Dr. Debara Pickett since he has not finished 3 wks of NOAC. Discussed risk and benefit include bradycardia, hypotension, aspiration event, and ultrasound probe related trauma (esophageal bleeding, hematoma, and rupture), he displayed clear understanding and agree to proceed.   2. Moderate AS  3. CAD s/p CABG x4 (2004) LIMA to LAD, SVG to RCA, SVG to OM, SVG to ramus  4. Bilateral renal artery stenosis: Most recent study 07/2014 showed60-99% diameter reduction bilaterally by renal/aorta ratio.   5. Carotid artery dx s/p L carotid endarterectomy  6. HTN 7. HLD 8. IDDM 9. Hypokalemia: on daily 16meq KCl for daily PO lasix. K 3.6 this morning,  will given extra dose of KCl 41meq   Signed, Charles [name] Pager: [phone]  I have seen and examined the patient along with Charles Deforest PA-C.  I have reviewed the chart, notes and new data.  I agree with PA's note.  Key new complaints: tired, no dyspnea at rest Key examination changes: irregular rhythm, no JVD and no edema Key new findings / data: K 3.6  PLAN: TEE guided DCCV today. If successful, probably DC this afternoon.  Charles Klein, MD, [place] [phone] 06/25/2015, 11:17 AM

## 2015-06-25 NOTE — Progress Notes (Signed)
Inpatient Diabetes Program Recommendations  AACE/ADA: New Consensus Statement on Inpatient Glycemic Control (2015)  Target Ranges:  Prepandial:   less than 140 mg/dL      Peak postprandial:   less than 180 mg/dL (1-2 hours)      Critically ill patients:  140 - 180 mg/dL   Review of Glycemic Control  Diabetes history: DM 2 Outpatient Diabetes medications: Lantus 26 units QHS, Metformin 1,000 mg BID Current orders for Inpatient glycemic control: Novolog Moderate TID  Inpatient Diabetes Program Recommendations: Insulin - Basal: Patient takes Lantus 26 units at home. Glucose elevated. Please consider ordering Lantus 16 units Q 24hrs.  Thanks,  Tama Headings RN, MSN, [facility] Inpatient Diabetes Coordinator Team Pager [phone] (8a-5p)

## 2015-06-26 DIAGNOSIS — I350 Nonrheumatic aortic (valve) stenosis: Secondary | ICD-10-CM | POA: Diagnosis not present

## 2015-06-26 DIAGNOSIS — I4892 Unspecified atrial flutter: Secondary | ICD-10-CM | POA: Diagnosis not present

## 2015-06-26 LAB — GLUCOSE, CAPILLARY: Glucose-Capillary: 149 mg/dL — ABNORMAL HIGH (ref 65–99)

## 2015-06-26 MED ORDER — METOPROLOL TARTRATE 12.5 MG HALF TABLET: 12.5000 mg | ORAL_TABLET | Freq: Two times a day (BID) | ORAL | Status: DC

## 2015-06-26 NOTE — Discharge Summary (Signed)
Discharge Summary   Patient ID: Charles [name],  MRN: [id number], DOB/AGE: October 05, 1931 81 y.o.  Admit date: 06/24/2015 Discharge date: 06/26/2015  Primary Care Provider: [name] Primary Cardiologist: Dr. [name]  Discharge Diagnoses Principal Problem:   Atrial flutter Active Problems:   Essential hypertension   Coronary atherosclerosis   Aortic stenosis   Allergies Allergies  Allergen Reactions  . Amlodipine Other (See Comments)    Edema  . Other Other (See Comments)    myalgias Horse serum Horse serum  . Rosiglitazone Other (See Comments)    REACTION: Did not help  . Rosiglitazone Maleate     REACTION: Did not help  . Tricor [Fenofibrate] Other (See Comments)    myalgias    Procedures  TEE with DCCV  LV EF: 55% -  60%  ------------------------------------------------------------------- Indications:   Atrial flutter 427.32.  ------------------------------------------------------------------- History:  PMH: Former Smoker. Atrial flutter. Aortic valve disease. Risk factors: Hypertension. Diabetes mellitus.  ------------------------------------------------------------------- Study Conclusions  - Left ventricle: There was mild concentric hypertrophy. Systolic function was normal. The estimated ejection fraction was in the range of 55% to 60%. Wall motion was normal; there were no regional wall motion abnormalities. - Aortic valve: Moderately calcified with mild to moderate aortic stenosis. - Mitral valve: There was mild regurgitation. No vegetation. - Left atrium: The atrium was dilated. No evidence of thrombus in the atrial cavity or appendage. No evidence of thrombus in the atrial cavity or appendage. - Right atrium: No evidence of thrombus in the atrial cavity or appendage. - Atrial septum: No defect or patent foramen ovale was identified. - Tricuspid valve: There was trivial regurgitation. No vegetation. - Pulmonic  valve: No evidence of vegetation.  Impressions:  - No cardiac source of emboli was indentified. Successful cardioversion.    Hospital Course  The patient is a 81 year old male with history of history of CAD s/p CABG x4v (2004), bilateral RAS, carotid artery dz s/p L CEA, HTN, HLD and DM Who presented to [facility] on 06/24/2015 with atrial flutter with normal rate. He had CABG x 4 in 2004 with a LIMA to his LAD, a vein to the RCA, OM branch and ramus branch. He also was found to have a 70% bilateral renal artery stenosis which we have been following by duplex ultrasound. He has had left carotid endarterectomy which we follow as well.   He was recently evaluated by his PCP was noted he was in atrial flutter with a rate in the 60s. His PCP started him on Eliquis 5 mg twice a day for stroke prophylaxis. He was seen by cardiology service 06/12/2015. Echocardiogram was obtained on the following day which showed EF 65-70%, mild LVH, mildly dilated left atrium, moderately dilated right atrium, moderate to severe AST, PA peak pressure 40 mmHg. Despite having adequate rate control, patient continued to feel worse and complaining of dyspnea with exertion.  After discussing various options with the patient, he agreed to undergo TEE and DC cardioversion after inpatient admission. Prior to admission, he has taken 1.5 weeks of Eliquis, therefore TEE is required. He underwent the scheduled procedure on 9/21, EF noted to be 65%, mild-to-moderate TR, moderate AST with aortic valve around 1.37 m. He had successful conversion to normal sinus rhythm after a single 120 J biphasic shock. He had transient second-degree AV block after cardioversion, however this was resolved on the following day. The transient AV block was felt to be related to sedation, we have resumed low-dose beta blocker. Outpatient cardiology  followup arranged.     Discharge Vitals Blood pressure 186/85, pulse 85, temperature 97.7 F (36.5  C), temperature source Oral, resp. rate 16, height 6\' 1"  (1.854 m), weight 189 lb 3.2 oz (85.821 kg), SpO2 99 %.  Filed Weights   06/24/15 1956 06/25/15 0455 06/26/15 0443  Weight: 191 lb (86.637 kg) 189 lb 4.8 oz (85.866 kg) 189 lb 3.2 oz (85.821 kg)    Labs  CBC  Recent Labs  06/24/15 1045 06/25/15 0332  WBC 7.1 6.5  NEUTROABS 4.9  --   HGB 13.8 12.9*  HCT 40.9 39.1  MCV 90.9 90.9  PLT 174 299   Basic Metabolic Panel  Recent Labs  06/24/15 1045 06/24/15 1655 06/25/15 0332  NA 134*  --  138  K 4.5  --  3.6  CL 99*  --  100*  CO2 28  --  30  GLUCOSE 323*  --  244*  BUN 14  --  12  CREATININE 0.79  --  0.75  CALCIUM 9.6  --  9.4  MG  --  1.6*  --    Cardiac Enzymes  Recent Labs  06/24/15 1655 06/24/15 2207 06/25/15 0332  TROPONINI 0.05* 0.06* 0.06*   Fasting Lipid Panel  Recent Labs  06/25/15 0332  CHOL 120  HDL 32*  LDLCALC 69  TRIG 95  CHOLHDL 3.8    Disposition  Pt is being discharged home today in good condition.  Follow-up Plans & Appointments      Follow-up Information    Follow up with Lyda Jester, PA-C On 06/30/2015.   Specialties:  Cardiology, Radiology   Why:  3:00pm   Contact information:   [address] [place] [address] [phone]       Discharge Medications    Medication List    TAKE these medications        apixaban 5 MG Tabs tablet  Commonly known as:  ELIQUIS  Take 1 tablet (5 mg total) by mouth 2 (two) times daily.     atorvastatin 20 MG tablet  Commonly known as:  LIPITOR  Take 20 mg by mouth daily at 6 PM.     B-12 500 MCG Tabs  Take 1 tablet by mouth daily.     BAYER MICROLET LANCETS lancets  Ck blood sugar three times daily and as directed. Dx 250.00. Insulin dependent     citalopram 20 MG tablet  Commonly known as:  CELEXA  Take 1 tablet (20 mg total) by mouth daily.     furosemide 40 MG tablet  Commonly known as:  LASIX  Take 1 tablet (40 mg total) by mouth daily.       glucose blood test strip  Commonly known as:  BAYER CONTOUR NEXT TEST  Ck blood sugar three times daily and as directed. Dx 250.00 insulin dependent     insulin glargine 100 UNIT/ML injection  Commonly known as:  LANTUS  Inject 0.28 mLs (28 Units total) into the skin at bedtime.     Insulin Syringe-Needle U-100 30G X 1/2" 1 ML Misc  Commonly known as:  B-D INS SYR ULTRAFINE 1CC/30G  1 Syringe by Does not apply route as directed.     lisinopril 40 MG tablet  Commonly known as:  PRINIVIL,ZESTRIL  Take 1 tablet (40 mg total) by mouth daily.     LORazepam 0.5 MG tablet  Commonly known as:  ATIVAN  Take 0.5-1 tablets (0.25-0.5 mg total) by mouth 2 (two) times daily  as needed for anxiety.     metFORMIN 1000 MG tablet  Commonly known as:  GLUCOPHAGE  Take 1 tablet (1,000 mg total) by mouth 2 (two) times daily with a meal.     metoprolol tartrate 25 MG tablet  Commonly known as:  LOPRESSOR  Take 0.5 tablets (12.5 mg total) by mouth 2 (two) times daily.     omeprazole 20 MG capsule  Commonly known as:  PRILOSEC  Take 1 capsule (20 mg total) by mouth daily.     potassium chloride SA 20 MEQ tablet  Commonly known as:  K-DUR,KLOR-CON  Take 20 mEq by mouth daily.     saw palmetto 160 MG capsule  Take 450 mg by mouth daily.     traZODone 50 MG tablet  Commonly known as:  DESYREL  Take 1 tablet (50 mg total) by mouth at bedtime.     vitamin C 500 MG tablet  Commonly known as:  ASCORBIC ACID  Take 500 mg by mouth daily.        Duration of Discharge Encounter   Greater than 30 minutes including physician time.  Hilbert Corrigan PA-C Pager: [phone] 06/26/2015, 12:54 PM

## 2015-06-26 NOTE — Progress Notes (Signed)
Pt being discharged home via wheelchair with family. Pt alert and oriented x4. VSS. Pt c/o no pain at this time. No signs of respiratory distress. Education complete and care plans resolved. IV removed with catheter intact and pt tolerated well. No further issues at this time. Pt to follow up with PCP. Stone,Heather R, RN 

## 2015-06-26 NOTE — Progress Notes (Signed)
Patient Name: Charles [name] Date of Encounter: 06/26/2015  Active Problems:   Essential hypertension   Coronary atherosclerosis   Aortic stenosis   Atrial flutter   Length of Stay:   SUBJECTIVE  Feels remarkably improved since cardioversion - more energy, no dyspnea Transient 2nd deg AV block, Mobitz 1 after cardioversion, resolved.  CURRENT MEDS . apixaban  5 mg Oral BID  . atorvastatin  20 mg Oral q1800  . citalopram  20 mg Oral Daily  . furosemide  40 mg Oral Daily  . insulin aspart  0-15 Units Subcutaneous TID WC  . lisinopril  40 mg Oral Daily  . pantoprazole  40 mg Oral Daily  . potassium chloride SA  20 mEq Oral Daily  . traZODone  50 mg Oral QHS    OBJECTIVE   Intake/Output Summary (Last 24 hours) at 06/26/15 1226 Last data filed at 06/26/15 1015  Gross per 24 hour  Intake   1800 ml  Output    675 ml  Net   1125 ml   Filed Weights   06/24/15 1956 06/25/15 0455 06/26/15 0443  Weight: 191 lb (86.637 kg) 189 lb 4.8 oz (85.866 kg) 189 lb 3.2 oz (85.821 kg)    PHYSICAL EXAM Filed Vitals:   06/25/15 2101 06/26/15 0443 06/26/15 0900 06/26/15 1225  BP: 140/98 153/86 151/79   Pulse:  92 86 85  Temp:  98.1 F (36.7 C) 98.1 F (36.7 C) 97.7 F (36.5 C)  TempSrc:  Oral Oral Oral  Resp:  16    Height:      Weight:  189 lb 3.2 oz (85.821 kg)    SpO2:  92% 92% 99%   General: Alert, oriented x3, no distress Head: no evidence of trauma, PERRL, EOMI, no exophtalmos or lid lag, no myxedema, no xanthelasma; normal ears, nose and oropharynx Neck: normal jugular venous pulsations and no hepatojugular reflux; brisk carotid pulses without delay and no carotid bruits Chest: clear to auscultation, no signs of consolidation by percussion or palpation, normal fremitus, symmetrical and full respiratory excursions Cardiovascular: normal position and quality of the apical impulse, regular rhythm, normal first and second heart sounds, no rubs or gallops, no murmur Abdomen:  no tenderness or distention, no masses by palpation, no abnormal pulsatility or arterial bruits, normal bowel sounds, no hepatosplenomegaly Extremities: no clubbing, cyanosis or edema; 2+ radial, ulnar and brachial pulses bilaterally; 2+ right femoral, posterior tibial and dorsalis pedis pulses; 2+ left femoral, posterior tibial and dorsalis pedis pulses; no subclavian or femoral bruits Neurological: grossly nonfocal  LABS  CBC  Recent Labs  06/24/15 1045 06/25/15 0332  WBC 7.1 6.5  NEUTROABS 4.9  --   HGB 13.8 12.9*  HCT 40.9 39.1  MCV 90.9 90.9  PLT 174 626   Basic Metabolic Panel  Recent Labs  06/24/15 1045 06/24/15 1655 06/25/15 0332  NA 134*  --  138  K 4.5  --  3.6  CL 99*  --  100*  CO2 28  --  30  GLUCOSE 323*  --  244*  BUN 14  --  12  CREATININE 0.79  --  0.75  CALCIUM 9.6  --  9.4  MG  --  1.6*  --    Liver Function Tests No results for input(s): AST, ALT, ALKPHOS, BILITOT, PROT, ALBUMIN in the last 72 hours. No results for input(s): LIPASE, AMYLASE in the last 72 hours. Cardiac Enzymes  Recent Labs  06/24/15 1655 06/24/15 2207 06/25/15 0332  TROPONINI  0.05* 0.06* 0.06*   BNP Invalid input(s): POCBNP D-Dimer No results for input(s): DDIMER in the last 72 hours. Hemoglobin A1C No results for input(s): HGBA1C in the last 72 hours. Fasting Lipid Panel  Recent Labs  06/25/15 0332  CHOL 120  HDL 32*  LDLCALC 69  TRIG 95  CHOLHDL 3.8   Thyroid Function Tests No results for input(s): TSH, T4TOTAL, T3FREE, THYROIDAB in the last 72 hours.  Invalid input(s): [place]  Radiology Studies Imaging results have been reviewed and No results found.  TELE NSR with 1:1 AV conduction, second degree AV block resolved, no significant bradycardia  ECG NSR, second degree AV block, Mobitz I immediately post DCCV  ASSESSMENT AND PLAN  Resume low dose beta blocker. AV block was probably related to residual sedation, although clearly some degree of  underlying conduction disease. Reinforced critical need for compliance with anticoagulant, especially over next 30 days. F/U and ECG in 7-14 days. If AV block becomes a persistent problem, he may need a dual chamber pacemaker.   Sanda Klein, MD, [facility] [phone] office [phone] pager 06/26/2015 12:26 PM

## 2015-06-26 NOTE — Discharge Instructions (Signed)
Atrial Flutter °Atrial flutter is a heart rhythm that can cause the heart to beat very fast (tachycardia). It originates in the upper chambers of the heart (atria). In atrial flutter, the top chambers of the heart (atria) often beat much faster than the bottom chambers of the heart (ventricles). Atrial flutter has a regular "saw toothed" appearance in an EKG readout. An EKG is a test that records the electrical activity of the heart. Atrial flutter can cause the heart to beat up to 150 beats per minute (BPM). Atrial flutter can either be short lived (paroxysmal) or permanent.  °CAUSES  °Causes of atrial flutter can be many. Some of these include: °· Heart related issues: °¨ Heart attack (myocardial infarction). °¨ Heart failure. °¨ Heart valve problems. °¨ Poorly controlled high blood pressure (hypertension). °¨ After open heart surgery. °· Lung related issues: °¨ A blood clot in the lungs (pulmonary embolism). °¨ Chronic obstructive pulmonary disease (COPD). Medications used to treat COPD can attribute to atrial flutter. °· Other related causes: °¨ Hyperthyroidism. °¨ Caffeine. °¨ Some decongestant cold medications. °¨ Low electrolyte levels such as potassium or magnesium. °¨ Cocaine. °SYMPTOMS °· An awareness of your heart beating rapidly (palpitations). °· Shortness of breath. °· Chest pain. °· Low blood pressure (hypotension). °· Dizziness or fainting. °DIAGNOSIS  °Different tests can be performed to diagnose atrial flutter.  °· An EKG. °· Holter monitor. This is a 24-hour recording of your heart rhythm. You will also be given a diary. Write down all symptoms that you have and what you were doing at the time you experienced symptoms. °· Cardiac event monitor. This small device can be worn for up to 30 days. When you have heart symptoms, you will push a button on the device. This will then record your heart rhythm. °· Echocardiogram. This is an imaging test to look at your heart. Your caregiver will look at your  heart valves and the ventricles. °· Stress test. This test can help determine if the atrial flutter is related to exercise or if coronary artery disease is present. °· Laboratory studies will look at certain blood levels like: °¨ Complete blood count (CBC). °¨ Potassium. °¨ Magnesium. °¨ Thyroid function. °TREATMENT  °Treatment of atrial flutter varies. A combination of therapies may be used or sometimes atrial flutter may need only 1 type of treatment.  °Lab work: °If your blood work, such as your electrolytes (potassium, magnesium) or your thyroid function tests, are abnormal, your caregiver will treat them accordingly.  °Medication:  °There are several different types of medications that can convert your heart to a normal rhythm and prevent atrial flutter from reoccurring.  °Nonsurgical procedures: °Nonsurgical techniques may be used to control atrial flutter. Some examples include: °· Cardioversion. This technique uses either drugs or an electrical shock to restore a normal heart rhythm: °¨ Cardioversion drugs may be given through an intravenous (IV) line to help "reset" the heart rhythm. °¨ In electrical cardioversion, your caregiver shocks your heart with electrical energy. This helps to reset the heartbeat to a normal rhythm. °· Ablation. If atrial flutter is a persistent problem, an ablation may be needed. This procedure is done under mild sedation. High frequency radio-wave energy is used to destroy the area of heart tissue responsible for atrial flutter. °SEEK IMMEDIATE MEDICAL CARE IF:  °You have: °· Dizziness. °· Near fainting or fainting. °· Shortness of breath. °· Chest pain or pressure. °· Sudden nausea or vomiting. °· Profuse sweating. °If you have the above symptoms,   call your local emergency service immediately! Do not drive yourself to the hospital. °MAKE SURE YOU:  °· Understand these instructions. °· Will watch your condition. °· Will get help right away if you are not doing well or get  worse. °Document Released: 02/06/2009 Document Revised: 02/04/2014 Document Reviewed: 02/06/2009 °[employer]® Patient Information ©2015 [employer]. This information is not intended to replace advice given to you by your health care provider. Make sure you discuss any questions you have with your health care provider. ° °

## 2015-06-27 ENCOUNTER — Encounter (HOSPITAL_COMMUNITY): Payer: Self-pay | Admitting: Internal Medicine

## 2015-06-30 ENCOUNTER — Ambulatory Visit (INDEPENDENT_AMBULATORY_CARE_PROVIDER_SITE_OTHER): Payer: Medicare Other | Admitting: Cardiology

## 2015-06-30 ENCOUNTER — Encounter: Payer: Self-pay | Admitting: Cardiology

## 2015-06-30 VITALS — BP 140/86 | HR 77 | Ht 73.0 in | Wt 189.4 lb

## 2015-06-30 DIAGNOSIS — I4892 Unspecified atrial flutter: Secondary | ICD-10-CM

## 2015-06-30 DIAGNOSIS — E669 Obesity, unspecified: Secondary | ICD-10-CM | POA: Diagnosis not present

## 2015-06-30 DIAGNOSIS — E119 Type 2 diabetes mellitus without complications: Secondary | ICD-10-CM

## 2015-06-30 DIAGNOSIS — E1169 Type 2 diabetes mellitus with other specified complication: Secondary | ICD-10-CM

## 2015-06-30 NOTE — Patient Instructions (Signed)
Medication Instructions:  Your physician recommends that you continue on your current medications as directed. Please refer to the Current Medication list given to you today.   Labwork: none  Testing/Procedures: none  Follow-Up: Your physician recommends that you schedule a follow-up appointment in: 6-8 weeks with Dr. [name]    Any Other Special Instructions Will Be Listed Below (If Applicable).

## 2015-06-30 NOTE — Progress Notes (Signed)
06/30/2015 Charles [name]   20-Jun-1932  [id number]  Primary Physician [name], MD Primary Cardiologist: Dr. [name]   Reason for Visit/CC:  Post hospital follow-up for atrial flutter  HPI:  The patient is a 80 year old male, followed by Dr. [name], with a history of CAD status post CABG x 4 in 2004 with a LIMA to his LAD, a vein to the right coronary artery, obtuse marginal branch and ramus branch. He also was found to have a 70% bilateral renal artery stenosis which we have been following by duplex ultrasound. He has had left carotid endarterectomy which we follow as well. His other problems include hypertension, hyperlipidemia and diabetes for which he takes insulin. His last functional study performed 11/2012 was nonischemic.  He was recently evaluated by his PCP, Dr. [name], for evaluation of shortness of breath and volume overload. EKG revealed atrial flutter with ventricular rates in the 60s. Subsequently Dr. [name] started him on Eliquis, 5 mg twice a day for stroke prophylaxis. His lasix was also increased for diuresis. Recent laboratory work demonstrated normal electrolytes and normal thyroid function test.   He was instructed to follow-up in our office for further evaluation. I evaluated him in clinic on 06/12/2015. At that time, EKG continued to show atrial flutter but with a controlled ventricular response in the low 50s. Given the fact that his rate was well controlled, he was anticoagulated with Eliquis and was also asymptomatic, the decision was made to continue with medical therapy/ 3 weeks of oral anticoagulation before proceeding with elective DCCV. However he developed symptoms of dyspnea and fatigue and near syncope prompting him to present to the ED on 06/24/15. At that time, he continued to be in atrial flutter. Since he was symptomatic, the decision was made to admit for further treatment. He ultimately underwent a TEE guided direct current cardioversion. The TEE  was negative for left atrial thrombus and also showed mild to moderate aortic stenosis. EF was normal at 65-70%. The cardioversion was performed by Dr. Debara Pickett and he was successfully converted back to normal sinus rhythm. He was continued on Eliquis for stroke prophylaxis.  He presents back to clinic today for post hospital follow-up. He reports that he has done well since discharge. He denies any recurrent symptoms of breakthrough atrial fibrillation. He denies any chest pain or dyspnea. His LEE has resolved. His EKG today shows normal sinus rhythm with a heart rate of 77 bpm. His blood pressure is stable at 140/86. He reports full medication compliance with Eliquis. He denies any abnormal bleeding or any falls while on Eliquis.    Current Outpatient Prescriptions  Medication Sig Dispense Refill  . apixaban (ELIQUIS) 5 MG TABS tablet Take 1 tablet (5 mg total) by mouth 2 (two) times daily. 60 tablet 6  . atorvastatin (LIPITOR) 20 MG tablet Take 20 mg by mouth daily at 6 PM.    . BAYER MICROLET LANCETS lancets Ck blood sugar three times daily and as directed. Dx 250.00. Insulin dependent 100 each 11  . citalopram (CELEXA) 20 MG tablet Take 1 tablet (20 mg total) by mouth daily.    . Cyanocobalamin (B-12) 500 MCG TABS Take 1 tablet by mouth daily.    . furosemide (LASIX) 40 MG tablet Take 1 tablet (40 mg total) by mouth daily.    [name] glucose blood (BAYER CONTOUR NEXT TEST) test strip Ck blood sugar three times daily and as directed. Dx 250.00 insulin dependent 100 each 11  . Insulin Syringe-Needle U-100 (  B-D INS SYR ULTRAFINE 1CC/30G) 30G X 1/2" 1 ML MISC 1 Syringe by Does not apply route as directed. 100 each 3  . LANTUS 100 UNIT/ML injection Inject 28 Units into the skin at bedtime.    [name] lisinopril (PRINIVIL,ZESTRIL) 40 MG tablet Take 1 tablet (40 mg total) by mouth daily. 90 tablet 1  . LORazepam (ATIVAN) 0.5 MG tablet Take 0.5-1 tablets (0.25-0.5 mg total) by mouth 2 (two) times daily as needed for  anxiety. 30 tablet 0  . metFORMIN (GLUCOPHAGE) 1000 MG tablet Take 1 tablet (1,000 mg total) by mouth 2 (two) times daily with a meal.    . metoprolol (LOPRESSOR) 25 MG tablet Take 0.5 tablets (12.5 mg total) by mouth 2 (two) times daily. 30 tablet 3  . omeprazole (PRILOSEC) 20 MG capsule Take 1 capsule (20 mg total) by mouth daily. 90 capsule 3  . potassium chloride SA (K-DUR,KLOR-CON) 20 MEQ tablet Take 20 mEq by mouth daily.     . saw palmetto 160 MG capsule Take 450 mg by mouth daily.     . traZODone (DESYREL) 50 MG tablet Take 1 tablet (50 mg total) by mouth at bedtime. 90 tablet 3  . vitamin C (ASCORBIC ACID) 500 MG tablet Take 500 mg by mouth daily.     No current facility-administered medications for this visit.    Allergies  Allergen Reactions  . Amlodipine Other (See Comments)    Edema  . Other Other (See Comments)    myalgias Horse serum Horse serum  . Rosiglitazone Other (See Comments)    REACTION: Did not help  . Rosiglitazone Maleate     REACTION: Did not help  . Tricor [Fenofibrate] Other (See Comments)    myalgias    Social History   Social History  . Marital Status: Married    Spouse Name: N/A  . Number of Children: N/A  . Years of Education: N/A   Occupational History  . Not on file.   Social History Main Topics  . Smoking status: Former Smoker -- 0.50 packs/day for 36 years    Types: Cigarettes    Quit date: 07/23/2013  . Smokeless tobacco: Never Used  . Alcohol Use: No  . Drug Use: No  . Sexual Activity: Not Currently   Other Topics Concern  . Not on file   Social History Narrative   Caffeine: 4 cups coffee/day, some tea and soda   Lives with wife   Occupation: Retired   Activity: no regular exercise   Diet: good water, fruits/vegetables daily     Review of Systems: General: negative for chills, fever, night sweats or weight changes.  Cardiovascular: negative for chest pain, dyspnea on exertion, edema, orthopnea, palpitations,  paroxysmal nocturnal dyspnea or shortness of breath Dermatological: negative for rash Respiratory: negative for cough or wheezing Urologic: negative for hematuria Abdominal: negative for nausea, vomiting, diarrhea, bright red blood per rectum, melena, or hematemesis Neurologic: negative for visual changes, syncope, or dizziness All other systems reviewed and are otherwise negative except as noted above.    Blood pressure 140/86, pulse 77, height 6\' 1"  (1.854 m), weight 189 lb 6.4 oz (85.911 kg).  General appearance: alert, cooperative and no distress Neck: no carotid bruit and no JVD Lungs: clear to auscultation bilaterally Heart: regular rate and rhythm and 2/6 SM Extremities: extremities normal, atraumatic, no cyanosis or edema Pulses: 2+ and symmetric Skin: Skin color, texture, turgor normal. No rashes or lesions Neurologic: Grossly normal  EKG NSR w/ LVH and  repolarization abnormlaity  ASSESSMENT AND PLAN:   1. Paroxsymal Atrial Flutter: s/p successful DCCV 06/25/15. EKG today shows NSR. No symptoms of any breakthrough afib. HR is well controlled. Continue metoprolol for rate control.  Continue with Eliquis for a/c given CHA2DS2 VASc score > 2 (Age >75, HTN, DM and Vas Dz).   2. CAD: h/o CABG x 4 in 2004 with low risk myoview in 2014. He denies any recent angina. Continue medical therapy.   3. Bilateral Carotid Artery Disease: s/p eft carotid endarterectomy. Asymptomatic. Dopplers followed yearly by Dr. [name]. Last assessment showed stable disease.   4. Bilateral RAS: followed yearly by renal dopplers. Most recent study 07/2014 showed60-99% diameter reduction bilaterally by renal/aorta ratio.   5. HTN: controlled on current regimen.   6. HLD: on atorvastatin 20 mg daily.   7. DM: on insulin. Most recent Hgb A1c near goal at 7.2. Followed by PCP.  8. Aortic Stenosis : murmur heard along the precordium. Loudest at RUSB. Recent TEE showed AS to be mild to moderate. Continue  to monitor yearly.   9. Chronic Diastolic CHF:  Condition improved after restoration of NSR. No dyspnea or edema. Euvolemic on physical exam. BP is well controlled. Continue daily lasix and low sodium diet.   PLAN  Patient appears to be stable from a cardiac standpoint. Continue medical therapy. Plan for f/u with Dr. [name] in 6-8 weeks. Dr. [name] to weigh in on the duration of Eliquis, given the high cost. He was given samples from our office today.    Lyda Jester PA-C 06/30/2015 3:43 PM

## 2015-07-01 ENCOUNTER — Ambulatory Visit: Payer: Medicare Other | Admitting: Cardiology

## 2015-07-07 NOTE — ED Provider Notes (Signed)
CSN: [id number]     Arrival date & time 06/24/15  7425 History   First MD Initiated Contact with Patient 06/24/15 1027     Chief Complaint  Patient presents with  . Shortness of Breath     (Consider location/radiation/quality/duration/timing/severity/associated sxs/prior Treatment) HPI Comments: 81 y.o. Male with history of CAD, CHF, atrial flutter presents for shortness of breath on exertion.  The patient has been following with his cardiologist for this issue and was sent for admission for TEE and possible cardioversion.  The patient was just started on Eliquis for his arrhythmia in the last two days.  Denies current chest pain.  No fever, chills.  No headache or focal weakness.  Shortness of breath is worse when laying flat or with exertion.  Patient is a 81 y.o. male presenting with shortness of breath.  Shortness of Breath Associated symptoms: no chest pain, no fever, no headaches, no rash, no vomiting and no wheezing     Past Medical History  Diagnosis Date  . History of chicken pox   . GERD (gastroesophageal reflux disease) 2003    h/o duodenal ulcer per EGD as well as esophagitis  . Seasonal allergies   . CAD (coronary artery disease) 2004    s/p 4v CABG (Dr. [name])  . HTN (hypertension)   . HLD (hyperlipidemia)   . Colon polyp 2005    (Dr. Tiffany Kocher)  . Urge incontinence of urine   . Psoriasis   . COPD (chronic obstructive pulmonary disease) 03/2011    by xray  . Carotid stenosis 1999    s/p L CEA  . History of colon polyps 2003, 2005    adenomatous Vira Agar)  . Ex-smoker   . Compression fracture of L1 lumbar vertebra remote  . 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 Aortic stenosis 06/2014    moderate-severe  . CHF (congestive heart failure) dx'd 06/2015  . T2DM (type 2 diabetes mellitus) 1995  . Renal artery stenosis 2004    70% bilateral, followed by cards  . Arthritis     in lower back (06/24/2015)  . DDD (degenerative disc disease),  lumbar 2014  . Depression     hx  . Atrial flutter     /notes 06/24/2015   Past Surgical History  Procedure Laterality Date  . Tonsillectomy    . Carotid endarterectomy Left 1999    (Sankar)  . Coronary artery bypass graft  03/07/2003    4v CABG ([facility]) with LIMA to LAD, vein graft to RCA, 1st obtuse marginal, and ramus intermedius  . Colonoscopy  2003    colon polyp x3 - adenomatous Tiffany Kocher)  . Cardiovascular stress test  11/2010    normal perfusion, no evidence of ischemia, EF 62% post exercise  . Upper gastrointestinal endoscopy  2003    reflux esophagitis, erosive gastropathy, duodenal ulcer  . Cardiovascular stress test  11/28/2012    Mild diaphragmatic attenuation; cannot exclude a focal region of nontransmural inferior scar  . Cardiac catheterization  03/06/2003    No intervention - recommend CABG  . Renal doppler  11/29/2011    Celiac&SMA-demonstrated vessel narrowing suggestive of a greater than 50% diameter reduction. Bilateral renal arteries-demonstrated vessel narrowing of 60-99% diameter reduction. Rt Kidney-mid pole lateral simple cyst noted measuring 1.29x0.76x1.11cm and exophytic cyst outside lower pole measuring 1.23x0.96x1.31. Lft Kidney-lateral mid to lower pole simple cyst measuering-1.24x9.83x1.24  . Cataract extraction w/ intraocular lens  implant, bilateral Bilateral 01/2013    [facility]  .  Colonoscopy  10/08/2012    2 TA, diverticulosis, int hem, no rpt rec Tiffany Kocher)  . Esophagogastroduodenoscopy  10/08/2012    nl esophagus, duodenitis and erosive gastropathy, path - gastropathy no Hpylori, no rpt rec  . Coronary angioplasty    . Cardioversion N/A 06/25/2015    Procedure: CARDIOVERSION;  Surgeon: Pixie Casino, MD;  Location: [facility] ENDOSCOPY;  Service: Cardiovascular;  Laterality: N/A;  . Tee without cardioversion N/A 06/25/2015    Procedure: TRANSESOPHAGEAL ECHOCARDIOGRAM (TEE);  Surgeon: Pixie Casino, MD;  Location: [facility] ENDOSCOPY;  Service: Cardiovascular;  Laterality:  N/A;   Family History  Problem Relation Age of Onset  . Hypertension Father   . Heart disease Father   . Cancer Mother     GI cancer  . Diabetes Sister   . Heart disease Sister   . Stroke Sister   . Hypertension Sister   . Cancer Brother     Lung cancer - nonsmoker  . Cancer Maternal Grandmother   . Heart attack Maternal Grandfather   . Heart attack Paternal Grandmother   . Heart attack Paternal Grandfather   . Cancer Brother     Pancreatic cancer  . Lung disease Brother    Social History  Substance Use Topics  . Smoking status: Former Smoker -- 0.50 packs/day for 36 years    Types: Cigarettes    Quit date: 07/23/2013  . Smokeless tobacco: Never Used  . Alcohol Use: No    Review of Systems  Constitutional: Negative for fever and appetite change.  HENT: Negative for congestion and rhinorrhea.   Eyes: Negative for visual disturbance.  Respiratory: Positive for shortness of breath. Negative for chest tightness and wheezing.   Cardiovascular: Positive for palpitations. Negative for chest pain.  Gastrointestinal: Negative for vomiting and diarrhea.  Genitourinary: Negative for flank pain and decreased urine volume.  Musculoskeletal: Negative for joint swelling and neck stiffness.  Skin: Negative for rash.  Neurological: Negative for dizziness, light-headedness and headaches.  Hematological: Bruises/bleeds easily.      Allergies  Amlodipine; Other; Rosiglitazone; Rosiglitazone maleate; and Tricor  Home Medications   Prior to Admission medications   Medication Sig Start Date End Date Taking? Authorizing Provider  apixaban (ELIQUIS) 5 MG TABS tablet Take 1 tablet (5 mg total) by mouth 2 (two) times daily. 06/12/15  Yes Brittainy Erie Noe, PA-C  atorvastatin (LIPITOR) 20 MG tablet Take 20 mg by mouth daily at 6 PM.   Yes Historical Provider, MD  BAYER MICROLET LANCETS lancets Ck blood sugar three times daily and as directed. Dx 250.00. Insulin dependent 06/19/13  Yes  [name], MD  citalopram (CELEXA) 20 MG tablet Take 1 tablet (20 mg total) by mouth daily. 06/11/15  Yes [name], MD  Cyanocobalamin (B-12) 500 MCG TABS Take 1 tablet by mouth daily.   Yes Historical Provider, MD  furosemide (LASIX) 40 MG tablet Take 1 tablet (40 mg total) by mouth daily. 06/18/15  Yes [name], MD  glucose blood (BAYER CONTOUR NEXT TEST) test strip Ck blood sugar three times daily and as directed. Dx 250.00 insulin dependent 06/19/13  Yes [name], MD  Insulin Syringe-Needle U-100 (B-D INS SYR ULTRAFINE 1CC/30G) 30G X 1/2" 1 ML MISC 1 Syringe by Does not apply route as directed. 02/04/15  Yes [name], MD  lisinopril (PRINIVIL,ZESTRIL) 40 MG tablet Take 1 tablet (40 mg total) by mouth daily. 02/12/15  Yes [name], MD  LORazepam (ATIVAN) 0.5 MG tablet Take 0.5-1 tablets (0.25-0.5 mg total)  by mouth 2 (two) times daily as needed for anxiety. 06/06/15  Yes [name], MD  metFORMIN (GLUCOPHAGE) 1000 MG tablet Take 1 tablet (1,000 mg total) by mouth 2 (two) times daily with a meal. 04/08/15  Yes [name], MD  metoprolol (LOPRESSOR) 25 MG tablet Take 0.5 tablets (12.5 mg total) by mouth 2 (two) times daily. 06/18/15  Yes [name], MD  omeprazole (PRILOSEC) 20 MG capsule Take 1 capsule (20 mg total) by mouth daily. 02/04/15  Yes [name], MD  potassium chloride SA (K-DUR,KLOR-CON) 20 MEQ tablet Take 20 mEq by mouth daily.  06/07/15  Yes [name], MD  saw palmetto 160 MG capsule Take 450 mg by mouth daily.    Yes Historical Provider, MD  traZODone (DESYREL) 50 MG tablet Take 1 tablet (50 mg total) by mouth at bedtime. 07/23/14  Yes [name], MD  vitamin C (ASCORBIC ACID) 500 MG tablet Take 500 mg by mouth daily.   Yes Historical Provider, MD  LANTUS 100 UNIT/ML injection Inject 28 Units into the skin at bedtime. 05/06/15   Historical Provider, MD   BP 186/85 mmHg  Pulse 85  Temp(Src) 97.7 F (36.5 C) (Oral)   Resp 16  Ht 6\' 1"  (1.854 m)  Wt 189 lb 3.2 oz (85.821 kg)  BMI 24.97 kg/m2  SpO2 99% Physical Exam  Constitutional: He is oriented to person, place, and time. No distress.  HENT:  Head: Normocephalic and atraumatic.  Eyes: Conjunctivae are normal. Right eye exhibits no discharge. Left eye exhibits no discharge.  Neck: Normal range of motion. Neck supple.  Cardiovascular: An irregular rhythm present. Bradycardia present.   Murmur heard. Pulmonary/Chest: Effort normal. No respiratory distress. He has no wheezes.  Abdominal: Soft. There is no tenderness.  Musculoskeletal: Normal range of motion.  Neurological: He is alert and oriented to person, place, and time.  Skin: Skin is warm and dry. No rash noted. He is not diaphoretic.    ED Course  Procedures (including critical care time) Labs Review Labs Reviewed  BASIC METABOLIC PANEL - Abnormal; Notable for the following:    Sodium 134 (*)    Chloride 99 (*)    Glucose, Bld 323 (*)    All other components within normal limits  BRAIN NATRIURETIC PEPTIDE - Abnormal; Notable for the following:    B Natriuretic Peptide 309.0 (*)    All other components within normal limits  TROPONIN I - Abnormal; Notable for the following:    Troponin I 0.05 (*)    All other components within normal limits  TROPONIN I - Abnormal; Notable for the following:    Troponin I 0.06 (*)    All other components within normal limits  TROPONIN I - Abnormal; Notable for the following:    Troponin I 0.06 (*)    All other components within normal limits  MAGNESIUM - Abnormal; Notable for the following:    Magnesium 1.6 (*)    All other components within normal limits  BASIC METABOLIC PANEL - Abnormal; Notable for the following:    Chloride 100 (*)    Glucose, Bld 244 (*)    All other components within normal limits  CBC - Abnormal; Notable for the following:    Hemoglobin 12.9 (*)    All other components within normal limits  PROTIME-INR - Abnormal;  Notable for the following:    Prothrombin Time 17.2 (*)    All other components within normal limits  GLUCOSE, CAPILLARY - Abnormal; Notable for the  following:    Glucose-Capillary 235 (*)    All other components within normal limits  LIPID PANEL - Abnormal; Notable for the following:    HDL 32 (*)    All other components within normal limits  GLUCOSE, CAPILLARY - Abnormal; Notable for the following:    Glucose-Capillary 306 (*)    All other components within normal limits  GLUCOSE, CAPILLARY - Abnormal; Notable for the following:    Glucose-Capillary 225 (*)    All other components within normal limits  GLUCOSE, CAPILLARY - Abnormal; Notable for the following:    Glucose-Capillary 171 (*)    All other components within normal limits  GLUCOSE, CAPILLARY - Abnormal; Notable for the following:    Glucose-Capillary 275 (*)    All other components within normal limits  GLUCOSE, CAPILLARY - Abnormal; Notable for the following:    Glucose-Capillary 156 (*)    All other components within normal limits  GLUCOSE, CAPILLARY - Abnormal; Notable for the following:    Glucose-Capillary 149 (*)    All other components within normal limits  CBC WITH DIFFERENTIAL/PLATELET  I-STAT TROPOININ, ED    Imaging Review No results found. I have personally reviewed and evaluated these images and lab results as part of my medical decision-making.   EKG Interpretation   Date/Time:  Tuesday June 24 2015 10:09:40 EDT Ventricular Rate:  46 PR Interval:    QRS Duration: 74 QT Interval:  438 QTC Calculation: 383 R Axis:   68 Text Interpretation:  Atrial flutter with variable A-V block Left  ventricular hypertrophy with repolarization abnormality Abnormal ECG When  compared to previous EKG atrial flutter has replaced sinus rhythm  Confirmed by NGUYEN, EMILY ([id number]) on 06/24/2015 12:18:29 PM      MDM  Patient seen and evaluated at bedside.  Bradycardic arrhythmia.  Presented in coordination  with cardiology.  Seen and evaluated by cardiology and admitted under their care. Final diagnoses:  Atrial flutter, unspecified    1. Bradycardia  2. Arrhythmia  3. Exertional dyspnea    Harvel Quale, MD 07/07/15 2315

## 2015-07-30 ENCOUNTER — Other Ambulatory Visit: Payer: Self-pay | Admitting: *Deleted

## 2015-07-30 MED ORDER — LORAZEPAM 0.5 MG PO TABS: 0.2500 mg | ORAL_TABLET | Freq: Two times a day (BID) | ORAL | Status: DC | PRN

## 2015-07-30 NOTE — Telephone Encounter (Signed)
Ok to refill in Dr. Synthia Innocent absence? Patient's wife passed away 2 weeks ago and he is having a hard time dealing with her death. You can send back to me. Thanks!

## 2015-07-31 NOTE — Telephone Encounter (Signed)
Rx called in as directed.   

## 2015-08-06 ENCOUNTER — Other Ambulatory Visit: Payer: Self-pay | Admitting: *Deleted

## 2015-08-06 MED ORDER — TRAZODONE HCL 50 MG PO TABS: 50.0000 mg | ORAL_TABLET | Freq: Every day | ORAL | Status: DC

## 2015-08-06 NOTE — Telephone Encounter (Signed)
Ok to refill 

## 2015-08-07 ENCOUNTER — Ambulatory Visit: Payer: Medicare Other | Admitting: Cardiovascular Disease

## 2015-08-13 ENCOUNTER — Other Ambulatory Visit: Payer: Self-pay | Admitting: Family Medicine

## 2015-08-22 ENCOUNTER — Ambulatory Visit: Payer: Medicare Other | Admitting: Cardiovascular Disease

## 2015-08-22 ENCOUNTER — Other Ambulatory Visit: Payer: Self-pay | Admitting: *Deleted

## 2015-08-22 DIAGNOSIS — R0989 Other specified symptoms and signs involving the circulatory and respiratory systems: Secondary | ICD-10-CM

## 2015-08-22 MED ORDER — LISINOPRIL 40 MG PO TABS: 40.0000 mg | ORAL_TABLET | Freq: Every day | ORAL | Status: DC

## 2015-08-25 ENCOUNTER — Ambulatory Visit (INDEPENDENT_AMBULATORY_CARE_PROVIDER_SITE_OTHER): Payer: Medicare Other | Admitting: Family Medicine

## 2015-08-25 ENCOUNTER — Encounter: Payer: Self-pay | Admitting: Family Medicine

## 2015-08-25 VITALS — BP 128/76 | HR 68 | Temp 97.6°F | Wt 184.8 lb

## 2015-08-25 DIAGNOSIS — N3000 Acute cystitis without hematuria: Secondary | ICD-10-CM

## 2015-08-25 DIAGNOSIS — Z794 Long term (current) use of insulin: Secondary | ICD-10-CM

## 2015-08-25 DIAGNOSIS — F331 Major depressive disorder, recurrent, moderate: Secondary | ICD-10-CM | POA: Diagnosis not present

## 2015-08-25 DIAGNOSIS — N390 Urinary tract infection, site not specified: Secondary | ICD-10-CM | POA: Insufficient documentation

## 2015-08-25 DIAGNOSIS — E118 Type 2 diabetes mellitus with unspecified complications: Secondary | ICD-10-CM

## 2015-08-25 LAB — POCT URINALYSIS DIPSTICK
Bilirubin, UA: NEGATIVE
Glucose, UA: NEGATIVE
Ketones, UA: NEGATIVE
NITRITE UA: NEGATIVE
PH UA: 6
PROTEIN UA: NEGATIVE
RBC UA: NEGATIVE
Spec Grav, UA: 1.025
Urobilinogen, UA: 0.2

## 2015-08-25 MED ORDER — CIPROFLOXACIN HCL 500 MG PO TABS: 500.0000 mg | ORAL_TABLET | Freq: Two times a day (BID) | ORAL | Status: DC

## 2015-08-25 MED ORDER — TRAZODONE HCL 50 MG PO TABS: 100.0000 mg | ORAL_TABLET | Freq: Every day | ORAL | Status: DC

## 2015-08-25 NOTE — Progress Notes (Signed)
BP 128/76 mmHg  Pulse 68  Temp(Src) 97.6 F (36.4 C) (Oral)  Wt 184 lb 12 oz (83.802 kg)   CC: multiple issues  Subjective:    Patient ID: Charles [name], male    DOB: [birth year], 81 y.o.   MRN: [id number]  HPI: Charles [name] is a 81 y.o. male presenting on 08/25/2015 for Sleeping Problem; Depression; and Urinary Frequency   Presents with son today. Pt lost wife 07/17/2015 from metastatic breast cancer to brain. He did undergo cardioversion for symptomatic atrial flutter 2015-07-05.  Since wife's death, endorses worsening depression, unable to sleep. Wakes up every hour. Has supportive family nearby. No appetite. Energy level down. Concentration stable. No SI/HI.   Known depression with anxiety. Takes celexa 20mg  daily with trazodone 50mg  nightly for sleep. Also on lorazepam 0.25-0.5mg  BID PRN anxiety.   Noticing increased urinary frequency and urgency over last 1 week. Bladder feels full but is unable to empty. Dribbling. + dysuria at end of stream. Daytime polyuria. Denies hematuria. Has started wearing pull ups. Some nausea with meals. Denies abd pain. Mild lower flank pain bilaterally. No fevers, vomiting. No h/o recurrent UTIs, no recent abx use.  DM - no low sugars. Several highs recently. Struggling with control. No appetite. Worried about high sugars despite poor eating.   Relevant past medical, surgical, family and social history reviewed and updated as indicated. Interim medical history since our last visit reviewed. Allergies and medications reviewed and updated. Current Outpatient Prescriptions on File Prior to Visit  Medication Sig  . atorvastatin (LIPITOR) 20 MG tablet Take 20 mg by mouth daily at 6 PM.  . BAYER MICROLET LANCETS lancets Ck blood sugar three times daily and as directed. Dx 250.00. Insulin dependent  . citalopram (CELEXA) 20 MG tablet Take 1 tablet (20 mg total) by mouth daily.  . Cyanocobalamin (B-12) 500 MCG TABS Take 1 tablet by mouth daily.  .  furosemide (LASIX) 40 MG tablet Take 1 tablet (40 mg total) by mouth daily.  [name] glucose blood (BAYER CONTOUR NEXT TEST) test strip Ck blood sugar three times daily and as directed. Dx 250.00 insulin dependent  . Insulin Syringe-Needle U-100 (B-D INS SYR ULTRAFINE 1CC/30G) 30G X 1/2" 1 ML MISC 1 Syringe by Does not apply route as directed.  [name] LANTUS 100 UNIT/ML injection Inject 0.28 mLs (28 Units total) into the skin at bedtime.  [name] lisinopril (PRINIVIL,ZESTRIL) 40 MG tablet Take 1 tablet (40 mg total) by mouth daily.  [name] LORazepam (ATIVAN) 0.5 MG tablet Take 0.5-1 tablets (0.25-0.5 mg total) by mouth 2 (two) times daily as needed for anxiety.  . metFORMIN (GLUCOPHAGE) 1000 MG tablet Take 1 tablet (1,000 mg total) by mouth 2 (two) times daily with a meal.  . metoprolol (LOPRESSOR) 25 MG tablet Take 0.5 tablets (12.5 mg total) by mouth 2 (two) times daily.  [name] omeprazole (PRILOSEC) 20 MG capsule Take 1 capsule (20 mg total) by mouth daily.  . potassium chloride SA (K-DUR,KLOR-CON) 20 MEQ tablet Take 20 mEq by mouth daily.   . saw palmetto 160 MG capsule Take 450 mg by mouth daily.   . vitamin C (ASCORBIC ACID) 500 MG tablet Take 500 mg by mouth daily.  [name] apixaban (ELIQUIS) 5 MG TABS tablet Take 1 tablet (5 mg total) by mouth 2 (two) times daily. (Patient not taking: Reported on 08/25/2015)   No current facility-administered medications on file prior to visit.    Review of Systems Per HPI unless specifically indicated in  ROS section     Objective:    BP 128/76 mmHg  Pulse 68  Temp(Src) 97.6 F (36.4 C) (Oral)  Wt 184 lb 12 oz (83.802 kg)  Wt Readings from Last 3 Encounters:  08/25/15 184 lb 12 oz (83.802 kg)  06/30/15 189 lb 6.4 oz (85.911 kg)  06/26/15 189 lb 3.2 oz (85.821 kg)    Physical Exam  Constitutional: He appears well-developed and well-nourished. No distress.  HENT:  Mouth/Throat: Oropharynx is clear and moist. No oropharyngeal exudate.  Cardiovascular: Normal rate, regular  rhythm and intact distal pulses.   Murmur (systolic) heard. Pulmonary/Chest: Effort normal and breath sounds normal. No respiratory distress. He has no wheezes. He has no rales.  Musculoskeletal: He exhibits no edema.  Skin: Skin is warm and dry. No rash noted. He is not diaphoretic.  Psychiatric: He has a normal mood and affect. His behavior is normal. Judgment and thought content normal.  Nursing note and vitals reviewed.  Lab Results  Component Value Date   HGBA1C 7.3* 04/08/2015      Assessment & Plan:   Problem List Items Addressed This Visit    UTI (urinary tract infection)    Check UA - consistent with UTI. Treat with cipro course sent to pharmacy. UCx sent. Update if not improved with treatment.  Lab Results  Component Value Date   CREATININE 0.75 06/25/2015        Relevant Orders   Urine culture   MDD (major depressive disorder), recurrent episode, moderate ([place]) - Primary    Deteriorated since wife's death last month. Condolences offered and support provided. Discussed and offered grievance counseling - pt declines for now but will let us know if decides to pursue. Continue celexa 10mg  daily with ativan PRN. For sleep - increase trazodone to 75 to 100mg  nightly. Update with effect. If ineffective, low threshold to change regimen to remeron to help with appetite and sleep. RTC 1 mo f/u visit      Relevant Medications   traZODone (DESYREL) 50 MG tablet   Controlled diabetes mellitus type 2 with complications [facility])    Lab Results  Component Value Date   HGBA1C 7.3* 04/08/2015  chronic. Sounds stable to slightly deteriorated. Continue current regimen of metformin 1000mg  bid with lantus 28 u nightly. Discussed ok to run mildly elevated over next few weeks while grieving - want to avoid hypoglycemia.          Follow up plan: Return in about 1 month (around 09/24/2015), or as needed, for follow up visit.

## 2015-08-25 NOTE — Assessment & Plan Note (Addendum)
Lab Results  Component Value Date   HGBA1C 7.3* 04/08/2015  chronic. Sounds stable to slightly deteriorated. Continue current regimen of metformin 1000mg  bid with lantus 28 u nightly. Discussed ok to run mildly elevated over next few weeks while grieving - want to avoid hypoglycemia.

## 2015-08-25 NOTE — Addendum Note (Signed)
Addended by: Royann Shivers A on: 08/25/2015 01:02 PM   Modules accepted: Orders

## 2015-08-25 NOTE — Patient Instructions (Addendum)
Grievance counseling is available - let us know if you'd like a referral.  Increase trazodone to 1.5 tablets nightly. If no improvement with sleep, increase to 2 tablets nightly. Buy glucerna shakes and drink 1/2-1 daily 30 min before lunch.  Return in 1 month for follow up and diabetes follow up.

## 2015-08-25 NOTE — Assessment & Plan Note (Signed)
Check UA - consistent with UTI. Treat with cipro course sent to pharmacy. UCx sent. Update if not improved with treatment.  Lab Results  Component Value Date   CREATININE 0.75 06/25/2015

## 2015-08-25 NOTE — Progress Notes (Signed)
Pre visit review using our clinic review tool, if applicable. No additional management support is needed unless otherwise documented below in the visit note. 

## 2015-08-25 NOTE — Assessment & Plan Note (Signed)
Deteriorated since wife's death last month. Condolences offered and support provided. Discussed and offered grievance counseling - pt declines for now but will let us know if decides to pursue. Continue celexa 10mg  daily with ativan PRN. For sleep - increase trazodone to 75 to 100mg  nightly. Update with effect. If ineffective, low threshold to change regimen to remeron to help with appetite and sleep. RTC 1 mo f/u visit

## 2015-08-27 LAB — URINE CULTURE

## 2015-09-23 ENCOUNTER — Encounter: Payer: Self-pay | Admitting: Family Medicine

## 2015-09-23 ENCOUNTER — Ambulatory Visit (INDEPENDENT_AMBULATORY_CARE_PROVIDER_SITE_OTHER): Payer: Medicare Other | Admitting: Family Medicine

## 2015-09-23 VITALS — BP 168/88 | HR 63 | Temp 97.7°F | Wt 184.0 lb

## 2015-09-23 DIAGNOSIS — I350 Nonrheumatic aortic (valve) stenosis: Secondary | ICD-10-CM

## 2015-09-23 DIAGNOSIS — E118 Type 2 diabetes mellitus with unspecified complications: Secondary | ICD-10-CM

## 2015-09-23 DIAGNOSIS — I4892 Unspecified atrial flutter: Secondary | ICD-10-CM

## 2015-09-23 DIAGNOSIS — I10 Essential (primary) hypertension: Secondary | ICD-10-CM

## 2015-09-23 DIAGNOSIS — N3000 Acute cystitis without hematuria: Secondary | ICD-10-CM

## 2015-09-23 DIAGNOSIS — K219 Gastro-esophageal reflux disease without esophagitis: Secondary | ICD-10-CM | POA: Diagnosis not present

## 2015-09-23 DIAGNOSIS — Z794 Long term (current) use of insulin: Secondary | ICD-10-CM

## 2015-09-23 DIAGNOSIS — R3915 Urgency of urination: Secondary | ICD-10-CM

## 2015-09-23 DIAGNOSIS — F331 Major depressive disorder, recurrent, moderate: Secondary | ICD-10-CM | POA: Diagnosis not present

## 2015-09-23 LAB — POCT URINALYSIS DIPSTICK
BILIRUBIN UA: NEGATIVE
Blood, UA: NEGATIVE
KETONES UA: NEGATIVE
Leukocytes, UA: NEGATIVE
Nitrite, UA: NEGATIVE
Spec Grav, UA: >=1.03
UROBILINOGEN UA: 0.2
pH, UA: 6

## 2015-09-23 LAB — COMPREHENSIVE METABOLIC PANEL
ALBUMIN: 4.2 g/dL (ref 3.5–5.2)
ALT: 21 U/L (ref 0–53)
AST: 21 U/L (ref 0–37)
Alkaline Phosphatase: 59 U/L (ref 39–117)
BILIRUBIN TOTAL: 0.5 mg/dL (ref 0.2–1.2)
BUN: 15 mg/dL (ref 6–23)
CALCIUM: 9.8 mg/dL (ref 8.4–10.5)
CHLORIDE: 101 meq/L (ref 96–112)
CO2: 30 meq/L (ref 19–32)
CREATININE: 0.74 mg/dL (ref 0.40–1.50)
GFR: 107.88 mL/min (ref >60.00)
Glucose, Bld: 169 mg/dL — ABNORMAL HIGH (ref 70–99)
Potassium: 4.1 mEq/L (ref 3.5–5.1)
SODIUM: 138 meq/L (ref 135–145)
Total Protein: 7.2 g/dL (ref 6.0–8.3)

## 2015-09-23 LAB — HEMOGLOBIN A1C: Hgb A1c MFr Bld: 9 % — ABNORMAL HIGH (ref 4.6–6.5)

## 2015-09-23 MED ORDER — PANTOPRAZOLE SODIUM 20 MG PO TBEC
20.0000 mg | DELAYED_RELEASE_TABLET | Freq: Every day | ORAL | Status: DC
Start: 2015-09-23 — End: 2016-04-27

## 2015-09-23 MED ORDER — PANTOPRAZOLE SODIUM 40 MG PO TBEC: 40.0000 mg | DELAYED_RELEASE_TABLET | Freq: Every day | ORAL | Status: DC

## 2015-09-23 NOTE — Assessment & Plan Note (Signed)
Rpt UA today to eval for persistent infection given persistent urinary urgency.

## 2015-09-23 NOTE — Patient Instructions (Addendum)
Call to schedule follow up with Dr [name]. Check about eliquis samples Stop omeprazole, start higher dose pantoprazole (protonix) 40mg  once daily.  Blood work today. Recheck urinalysis today to ensure infection has cleared. Ok to continue trazodone 100mg  nightly.  Return as needed or in 3-4 months for follow up

## 2015-09-23 NOTE — Assessment & Plan Note (Addendum)
Chronic. Recheck A1c today. Continue metformin 1000mg  BID, lantus 28 u nightly.

## 2015-09-23 NOTE — Assessment & Plan Note (Signed)
bp elevated today - will continue to monitor.

## 2015-09-23 NOTE — Assessment & Plan Note (Addendum)
Ongoing mourning after wife's passing. Sxs stabilizing. Continue celexa 20mg  + trazodone 100mg  nightly.

## 2015-09-23 NOTE — Assessment & Plan Note (Signed)
Change omeprazole to pantoprazole due to celexa interaction.

## 2015-09-23 NOTE — Assessment & Plan Note (Signed)
S/p cardioversion, rhythm sounds normal today. Discussed eliquis - advised he call cardiology to see if samples available and schedule f/u with Dr [name] as missed last appt (around when wife passed away).

## 2015-09-23 NOTE — Assessment & Plan Note (Signed)
rec f/u with Dr [name]. Watch diuretic use. Decrease lasix to 40mg  daily.

## 2015-09-23 NOTE — Progress Notes (Signed)
BP 168/88 mmHg  Pulse 63  Temp(Src) 97.7 F (36.5 C) (Oral)  Wt 184 lb (83.462 kg)  SpO2 95%   CC: 1 mo f/u visit  Subjective:    Patient ID: Charles [name], male    DOB: 1932-05-21, 81 y.o.   MRN: [id number]  HPI: ABDO HABEEB is a 81 y.o. male presenting on 09/23/2015 for Follow-up   See prior note for details. Presents with son today. Pt lost wife 07/17/2015 from metastatic breast cancer to brain. He did undergo cardioversion for symptomatic atrial flutter 06/25/2015. No longer taking eliquis (ran out 08/2015). Denies dyspnea, palpitations or chest tightness. Ongoing fatigue.   Has started glucerna supplements, one daily. Sugars average 90-180. Lab Results  Component Value Date   HGBA1C 7.3* 30-Apr-2015     After wife's death, endorsed worsening depression, insomnia. Prior regimen was celexa 20mg  daily, trazodone 50mg  nightly, lorazepam 0.25-0.5mg  bid prn anxiety. Last visit we increased trazodone to 75-100mg  nightly. He is currently taking 100mg  nightly and feels this has helped. Appetite has normalized.  Supportive family nearby.   Also found to have UTI - treated with cipro course. Some improvement noted. Denies dysuria, hematuria, fevers, flank pain. Persistent frequency and urgency. Has decreased furosemide to 40mg  once daily, persistent urinary sxs.   Gnawing pain in stomach that goes away with food. This started 1 month ago. Denies nausea/vomiting, blood in stool.   Relevant past medical, surgical, family and social history reviewed and updated as indicated. Interim medical history since our last visit reviewed. Allergies and medications reviewed and updated. Current Outpatient Prescriptions on File Prior to Visit  Medication Sig  . atorvastatin (LIPITOR) 20 MG tablet Take 20 mg by mouth daily at 6 PM.  . BAYER MICROLET LANCETS lancets Ck blood sugar three times daily and as directed. Dx 250.00. Insulin dependent  . citalopram (CELEXA) 20 MG tablet Take 1 tablet  (20 mg total) by mouth daily.  . Cyanocobalamin (B-12) 500 MCG TABS Take 1 tablet by mouth daily.  . furosemide (LASIX) 40 MG tablet Take 40 mg by mouth as needed.   [name] glucose blood (BAYER CONTOUR NEXT TEST) test strip Ck blood sugar three times daily and as directed. Dx 250.00 insulin dependent  . Insulin Syringe-Needle U-100 (B-D INS SYR ULTRAFINE 1CC/30G) 30G X 1/2" 1 ML MISC 1 Syringe by Does not apply route as directed.  [name] LANTUS 100 UNIT/ML injection Inject 0.28 mLs (28 Units total) into the skin at bedtime.  [name] lisinopril (PRINIVIL,ZESTRIL) 40 MG tablet Take 1 tablet (40 mg total) by mouth daily.  [name] LORazepam (ATIVAN) 0.5 MG tablet Take 0.5-1 tablets (0.25-0.5 mg total) by mouth 2 (two) times daily as needed for anxiety.  . metFORMIN (GLUCOPHAGE) 1000 MG tablet Take 1 tablet (1,000 mg total) by mouth 2 (two) times daily with a meal.  . metoprolol (LOPRESSOR) 25 MG tablet Take 0.5 tablets (12.5 mg total) by mouth 2 (two) times daily.  . potassium chloride SA (K-DUR,KLOR-CON) 20 MEQ tablet Take 20 mEq by mouth daily.   . saw palmetto 160 MG capsule Take 450 mg by mouth daily.   . traZODone (DESYREL) 50 MG tablet Take 2 tablets (100 mg total) by mouth at bedtime.  . vitamin C (ASCORBIC ACID) 500 MG tablet Take 500 mg by mouth daily.  [name] apixaban (ELIQUIS) 5 MG TABS tablet Take 1 tablet (5 mg total) by mouth 2 (two) times daily. (Patient not taking: Reported on 08/25/2015)   No current facility-administered medications  on file prior to visit.    Review of Systems Per HPI unless specifically indicated in ROS section     Objective:    BP 168/88 mmHg  Pulse 63  Temp(Src) 97.7 F (36.5 C) (Oral)  Wt 184 lb (83.462 kg)  SpO2 95%  Wt Readings from Last 3 Encounters:  09/23/15 184 lb (83.462 kg)  08/25/15 184 lb 12 oz (83.802 kg)  06/30/15 189 lb 6.4 oz (85.911 kg)    Physical Exam  Constitutional: He appears well-developed and well-nourished. No distress.  HENT:  Mouth/Throat:  Oropharynx is clear and moist. No oropharyngeal exudate.  Cardiovascular: Normal rate, regular rhythm and intact distal pulses.   Murmur (4/6 systolic murmur throughout precordium) heard. Pulmonary/Chest: Effort normal and breath sounds normal. No respiratory distress. He has no wheezes. He has no rales.  Abdominal: Soft. Normal appearance and bowel sounds are normal. He exhibits no distension and no mass. There is no hepatosplenomegaly. There is tenderness (mild) in the suprapubic area and left lower quadrant. There is no rigidity, no rebound, no guarding and negative Murphy's sign.  Musculoskeletal: He exhibits no edema.  Skin: Skin is warm and dry. No rash noted.  Psychiatric: He has a normal mood and affect.  Nursing note and vitals reviewed.  Results for orders placed or performed in visit on 08/25/15  Urine culture  Result Value Ref Range   Culture ESCHERICHIA COLI    Colony Count 35,000 COLONIES/ML    Organism ID, Bacteria ESCHERICHIA COLI       Susceptibility   Escherichia coli -  (no method available)    AMPICILLIN 4 Sensitive     AMOX/CLAVULANIC <=2 Sensitive     AMPICILLIN/SULBACTAM <=2 Sensitive     PIP/TAZO <=4 Sensitive     IMIPENEM <=0.25 Sensitive     CEFAZOLIN <=4 Not Reportable     CEFTRIAXONE <=1 Sensitive     CEFTAZIDIME <=1 Sensitive     CEFEPIME <=1 Sensitive     GENTAMICIN <=1 Sensitive     TOBRAMYCIN <=1 Sensitive     CIPROFLOXACIN <=0.25 Sensitive     LEVOFLOXACIN <=0.12 Sensitive     NITROFURANTOIN <=16 Sensitive     TRIMETH/SULFA* <=20 Sensitive      * NR=NOT REPORTABLE,SEE COMMENTORAL therapy:A cefazolin MIC of <32 predicts susceptibility to the oral agents cefaclor,cefdinir,cefpodoxime,cefprozil,cefuroxime,cephalexin,and loracarbef when used for therapy of uncomplicated UTIs due to E.coli,K.pneumomiae,and P.mirabilis. PARENTERAL therapy: A cefazolinMIC of >8 indicates resistance to parenteralcefazolin. An alternate test method must beperformed to confirm  susceptibility to parenteralcefazolin.  POCT Urinalysis Dipstick  Result Value Ref Range   Color, UA Yellow    Clarity, UA Clear    Glucose, UA Negative    Bilirubin, UA Negative    Ketones, UA Negative    Spec Grav, UA 1.025    Blood, UA Negative    pH, UA 6.0    Protein, UA Negative    Urobilinogen, UA 0.2    Nitrite, UA Negative    Leukocytes, UA moderate (2+) (A) Negative      Assessment & Plan:   Problem List Items Addressed This Visit    UTI (urinary tract infection)    Rpt UA today to eval for persistent infection given persistent urinary urgency.      MDD (major depressive disorder), recurrent episode, moderate ([place])    Ongoing mourning after wife's passing. Sxs stabilizing. Continue celexa 20mg  + trazodone 100mg  nightly.      GERD    Change omeprazole to pantoprazole due to  celexa interaction.       Relevant Medications   pantoprazole (PROTONIX) 20 MG tablet   Essential hypertension    bp elevated today - will continue to monitor.       Controlled diabetes mellitus type 2 with complications ([place]) - Primary    Chronic. Recheck A1c today. Continue metformin 1000mg  BID, lantus 28 u nightly.      Relevant Orders   Hemoglobin A1c   Comprehensive metabolic panel   Atrial flutter ([facility])    S/p cardioversion, rhythm sounds normal today. Discussed eliquis - advised he call cardiology to see if samples available and schedule f/u with Dr [name] as missed last appt (around when wife passed away).      Aortic stenosis    rec f/u with Dr [name]. Watch diuretic use. Decrease lasix to 40mg  daily.        Other Visit Diagnoses    Urgency of urination        Relevant Orders    POCT Urinalysis Dipstick        Follow up plan: Return in about 3 months (around 12/22/2015), or if symptoms worsen or fail to improve, for follow up visit.

## 2015-09-26 ENCOUNTER — Encounter: Payer: Self-pay | Admitting: Family Medicine

## 2015-10-07 ENCOUNTER — Encounter: Payer: Self-pay | Admitting: *Deleted

## 2015-11-07 ENCOUNTER — Other Ambulatory Visit: Payer: Self-pay | Admitting: Family Medicine

## 2015-11-24 ENCOUNTER — Ambulatory Visit (INDEPENDENT_AMBULATORY_CARE_PROVIDER_SITE_OTHER): Payer: Medicare Other | Admitting: Cardiology

## 2015-11-24 ENCOUNTER — Encounter: Payer: Self-pay | Admitting: Cardiology

## 2015-11-24 VITALS — BP 150/68 | HR 70 | Ht 73.0 in | Wt 189.6 lb

## 2015-11-24 DIAGNOSIS — I701 Atherosclerosis of renal artery: Secondary | ICD-10-CM

## 2015-11-24 DIAGNOSIS — I4892 Unspecified atrial flutter: Secondary | ICD-10-CM | POA: Diagnosis not present

## 2015-11-24 DIAGNOSIS — I6523 Occlusion and stenosis of bilateral carotid arteries: Secondary | ICD-10-CM

## 2015-11-24 MED ORDER — METOPROLOL TARTRATE 25 MG PO TABS: 25.0000 mg | ORAL_TABLET | Freq: Two times a day (BID) | ORAL | Status: DC

## 2015-11-24 NOTE — Patient Instructions (Signed)
Medication Instructions:   INCREASE METOPROLOL TO 25 MG ONE TABLET TWICE DAILY  Testing/Procedures:  Your physician has requested that you have a carotid duplex. This test is an ultrasound of the carotid arteries in your neck. It looks at blood flow through these arteries that supply the brain with blood. Allow one hour for this exam. There are no restrictions or special instructions.   Your physician has requested that you have a renal artery duplex. During this test, an ultrasound is used to evaluate blood flow to the kidneys. Allow one hour for this exam. Do not eat after midnight the day before and avoid carbonated beverages. Take your medications as you usually do.    Follow-Up:  Your physician recommends that you schedule a follow-up appointment in: [place]

## 2015-11-24 NOTE — Progress Notes (Signed)
11/24/2015 Charles [name]   02/09/1932  [id number]  Primary Physician [name], MD Primary Cardiologist: Dr. [name]   Reason for Visit/CC: F/u for Atrial Flutter  HPI:  The patient is a 82 year old male, followed by Dr. [name], with a history of CAD status post CABG x 4 in 2004 with a LIMA to his LAD, a vein to the right coronary artery, obtuse marginal branch and ramus branch. He also was found to have a 70% bilateral renal artery stenosis which we have been following by duplex ultrasound. He has had left carotid endarterectomy which we follow as well. His other problems include hypertension, hyperlipidemia and diabetes for which he takes insulin. His last functional study performed 11/2012 was nonischemic.  He was recently evaluated by his PCP, Dr. [name], for evaluation of shortness of breath and volume overload. EKG revealed atrial flutter with ventricular rates in the 60s. Subsequently Dr. [name] started him on Eliquis, 5 mg twice a day for stroke prophylaxis. His lasix was also increased for diuresis. Laboratory work demonstrated normal electrolytes and normal thyroid function test.   He was instructed to follow-up in our office for further evaluation. I evaluated him in clinic on 06/12/2015. At that time, EKG continued to show atrial flutter but with a controlled ventricular response in the low 50s. Given the fact that his rate was well controlled, he was anticoagulated with Eliquis and was also asymptomatic, the decision was made to continue with medical therapy/ 3 weeks of oral anticoagulation before proceeding with elective DCCV. However he developed symptoms of dyspnea and fatigue and near syncope prompting him to present to the ED on 06/24/15. At that time, he continued to be in atrial flutter. Since he was symptomatic, the decision was made to admit for further treatment. He ultimately underwent a TEE guided direct current cardioversion. The TEE was negative for left atrial  thrombus and also showed mild to moderate aortic stenosis. EF was normal at 65-70%. The cardioversion was performed by Dr. Debara Pickett and he was successfully converted back to normal sinus rhythm. He was continued on Eliquis for stroke prophylaxis.  I saw him back on 06/30/15 for post hospital f/u. He was doing well and EKG showed NSR. He was instructed to f/u with Dr. [name] in 6-8 weeks. He missed his f/u with Dr. [name]. He now presents back to clinic for f/u. Since his last OV, he lost his wife to brain cancer. Since then, he has suffered from worsening depression and insomnia. Dr. [name] has been managing his anxiety and depression.   He presents to clinic today with his daughter. Other than the loss of his wife, he reports that he has done fairly well from a cardiac standpoint. He denies any CP or dyspnea. No symptoms of breakthrough atrial fibrillation. He denies palpitations, fatigue, dizziness, syncope/ near syncope. No exertional dyspnea, orthopnea, PND or LEE. He has been compliant with most of his medications except Eliquis. He reports that he discontinued this about one month ago due to his high co-pay of nearly $500 a month. He also has concerns regarding the commercials he has seen on TV and worries about increased risk for bleeding. He reports he has a history of a gastric ulcer and wishes not to continue Eliquis at this time. His EKG today demonstrates normal sinus rhythm. Ventricular rate is 70 beats per minute. His blood pressure is moderately elevated in clinic today at 150/68.    Current Outpatient Prescriptions  Medication Sig Dispense Refill  . apixaban (  ELIQUIS) 5 MG TABS tablet Take 1 tablet (5 mg total) by mouth 2 (two) times daily. 60 tablet 6  . atorvastatin (LIPITOR) 20 MG tablet Take 20 mg by mouth daily at 6 PM.    . BAYER MICROLET LANCETS lancets Ck blood sugar three times daily and as directed. Dx 250.00. Insulin dependent 100 each 11  . citalopram (CELEXA) 20 MG tablet Take  1 tablet (20 mg total) by mouth daily.    . Cyanocobalamin (B-12) 500 MCG TABS Take 1 tablet by mouth daily.    . furosemide (LASIX) 40 MG tablet Take 40 mg by mouth as needed.     [name] glucose blood (BAYER CONTOUR NEXT TEST) test strip Ck blood sugar three times daily and as directed. Dx 250.00 insulin dependent 100 each 11  . Insulin Syringe-Needle U-100 (B-D INS SYR ULTRAFINE 1CC/30G) 30G X 1/2" 1 ML MISC 1 Syringe by Does not apply route as directed. 100 each 3  . LANTUS 100 UNIT/ML injection Inject 0.28 mLs (28 Units total) into the skin at bedtime. 10 mL 11  . lisinopril (PRINIVIL,ZESTRIL) 40 MG tablet Take 1 tablet (40 mg total) by mouth daily. 90 tablet 3  . LORazepam (ATIVAN) 0.5 MG tablet Take 0.5-1 tablets (0.25-0.5 mg total) by mouth 2 (two) times daily as needed for anxiety. 30 tablet 0  . metFORMIN (GLUCOPHAGE) 1000 MG tablet Take 1 tablet (1,000 mg total) by mouth 2 (two) times daily with a meal.    . metoprolol tartrate (LOPRESSOR) 25 MG tablet Take 0.5 tablets (12.5 mg total) by mouth 2 (two) times daily. 30 tablet 6  . pantoprazole (PROTONIX) 20 MG tablet Take 1 tablet (20 mg total) by mouth daily. 30 tablet 6  . potassium chloride SA (K-DUR,KLOR-CON) 20 MEQ tablet Take 20 mEq by mouth daily.     . saw palmetto 160 MG capsule Take 450 mg by mouth daily.     . traZODone (DESYREL) 50 MG tablet Take 2 tablets (100 mg total) by mouth at bedtime. 180 tablet 3  . vitamin C (ASCORBIC ACID) 500 MG tablet Take 500 mg by mouth daily.     No current facility-administered medications for this visit.    Allergies  Allergen Reactions  . Amlodipine Other (See Comments)    Edema  . Other Other (See Comments)    myalgias Horse serum Horse serum  . Rosiglitazone Other (See Comments)    REACTION: Did not help  . Rosiglitazone Maleate     REACTION: Did not help  . Tricor [Fenofibrate] Other (See Comments)    myalgias    Social History   Social History  . Marital Status: Married     Spouse Name: N/A  . Number of Children: N/A  . Years of Education: N/A   Occupational History  . Not on file.   Social History Main Topics  . Smoking status: Former Smoker -- 0.50 packs/day for 36 years    Types: Cigarettes    Quit date: 07/23/2013  . Smokeless tobacco: Never Used  . Alcohol Use: No  . Drug Use: No  . Sexual Activity: Not Currently   Other Topics Concern  . Not on file   Social History Narrative   Caffeine: 4 cups coffee/day, some tea and soda   Lives with wife   Occupation: Retired   Activity: no regular exercise   Diet: good water, fruits/vegetables daily     Review of Systems: General: negative for chills, fever, night sweats or weight changes.  Cardiovascular: negative for chest pain, dyspnea on exertion, edema, orthopnea, palpitations, paroxysmal nocturnal dyspnea or shortness of breath Dermatological: negative for rash Respiratory: negative for cough or wheezing Urologic: negative for hematuria Abdominal: negative for nausea, vomiting, diarrhea, bright red blood per rectum, melena, or hematemesis Neurologic: negative for visual changes, syncope, or dizziness All other systems reviewed and are otherwise negative except as noted above.    Blood pressure 150/68, pulse 70, height 6\' 1"  (1.854 m), weight 189 lb 9 oz (85.985 kg).  General appearance: alert, cooperative and no distress Neck: no carotid bruit and no JVD Lungs: clear to auscultation bilaterally Heart: regular rate and rhythm and 3/6 SM along RSB, LSB and apex Abdomen: + bilateral RA bruits Extremities: no LEE Pulses: 2+ and symmetric Skin: warm and dry Neurologic: Grossly normal  EKG NSR. 70 bpm.  ASSESSMENT AND PLAN:   1. Paroxsymal Atrial Flutter: s/p successful DCCV 06/25/15. EKG today shows NSR. No symptoms of any breakthrough afib. HR is well controlled. Continue metoprolol for rate control. Will increase dose to 25 mg BID for BP. His CHA2DS2 VASc score is > 2 (Age >75, HTN, DM  and Vas Dz). However he has opted to refrain from anticoagulation given concerns for bleed risk, as he has a h/o a gastric ulcer. He also has had issues regarding cost. We discussed stroke risk with PAF and I offered assistance with medication samples. He understands this risk but still opts to refrain from further use at this time. Patient instructed to notify our office if any recurrent symptoms.  2. CAD: h/o CABG x 4 in 2004 with low risk myoview in 2014. He denies any recent angina. Continue medical therapy.   3. Bilateral Carotid Artery Disease: s/p left carotid endarterectomy. Asymptomatic. Dopplers have been followed by Dr. [name]. Last assessment showed stable disease. He is past due for repeat assessment. Last carotid doppler was 10/2013. Will order [place] today to assess.   4. Bilateral RAS: followed yearly by renal dopplers. Most recent study 07/2014 showed 60-99% diameter reduction bilaterally by renal/aorta ratio. Physical exam with bilateral renal artery bruits. He missed his doppler for 2016. Will order study today to reassess.   5. HTN: moderately elevated in the [id number] systolic on current regimen. We will increase his metoprolol from 12.5 mg to 25 mg BID.   6. HLD: on atorvastatin 20 mg daily. Lipid profile is followed by his PCP.   7. DM: on insulin. Poorly controlled. Last Hgb A1c 09/2015 was 9.0, up from 7.3. Followed by PCP.  8. Aortic Stenosis : murmur heard along the precordium. Loudest at RUSB. Recent TEE showed AS to be mild to moderate.  He denies CP and dyspnea. Continue to monitor yearly.   9. Chronic Diastolic CHF: Condition improved after restoration of NSR. No dyspnea or edema. Euvolemic on physical exam. BP is controlled. Continue daily lasix and low sodium diet.   PLAN  Obtain repeat carotid and renal artery dopplers. F/u with Dr. [name] in 2-3 months.   Charles Jester PA-C 11/24/2015 1:43 PM

## 2015-12-18 ENCOUNTER — Ambulatory Visit (HOSPITAL_COMMUNITY)
Admission: RE | Admit: 2015-12-18 | Discharge: 2015-12-18 | Disposition: A | Discharge status: Home / Self Care | Payer: Medicare Other | Source: Ambulatory Visit | Attending: Cardiology | Admitting: Cardiology

## 2015-12-18 ENCOUNTER — Telehealth: Payer: Self-pay

## 2015-12-18 DIAGNOSIS — I110 Hypertensive heart disease with heart failure: Secondary | ICD-10-CM | POA: Diagnosis not present

## 2015-12-18 DIAGNOSIS — E119 Type 2 diabetes mellitus without complications: Secondary | ICD-10-CM | POA: Insufficient documentation

## 2015-12-18 DIAGNOSIS — I6523 Occlusion and stenosis of bilateral carotid arteries: Secondary | ICD-10-CM | POA: Diagnosis not present

## 2015-12-18 DIAGNOSIS — Z87891 Personal history of nicotine dependence: Secondary | ICD-10-CM | POA: Diagnosis not present

## 2015-12-18 DIAGNOSIS — I509 Heart failure, unspecified: Secondary | ICD-10-CM | POA: Diagnosis not present

## 2015-12-18 DIAGNOSIS — I708 Atherosclerosis of other arteries: Secondary | ICD-10-CM | POA: Diagnosis not present

## 2015-12-18 DIAGNOSIS — I701 Atherosclerosis of renal artery: Secondary | ICD-10-CM | POA: Diagnosis not present

## 2015-12-18 DIAGNOSIS — E785 Hyperlipidemia, unspecified: Secondary | ICD-10-CM | POA: Diagnosis not present

## 2015-12-18 NOTE — Telephone Encounter (Signed)
In your IN box for completion.  

## 2015-12-18 NOTE — Telephone Encounter (Signed)
Message left advising patient's son that paperwork was ready and placed up front for pick up.

## 2015-12-18 NOTE — Telephone Encounter (Signed)
Scott Seawell Son [phone]  [name] dropped off Disability Placard to be filled out. Call when ready to pick up. Placed in RX box up front.

## 2015-12-18 NOTE — Telephone Encounter (Signed)
Filled and in Kims' box. Does he use cane to walk?

## 2015-12-19 ENCOUNTER — Other Ambulatory Visit: Payer: Self-pay | Admitting: Family Medicine

## 2015-12-23 NOTE — Telephone Encounter (Signed)
Patient's son confirmed that patient does walk with a cane.

## 2015-12-24 ENCOUNTER — Other Ambulatory Visit: Payer: Self-pay | Admitting: Family Medicine

## 2016-01-27 ENCOUNTER — Ambulatory Visit (INDEPENDENT_AMBULATORY_CARE_PROVIDER_SITE_OTHER): Payer: Medicare Other | Admitting: Cardiovascular Disease

## 2016-01-27 ENCOUNTER — Other Ambulatory Visit: Payer: Self-pay | Admitting: *Deleted

## 2016-01-27 ENCOUNTER — Encounter: Payer: Self-pay | Admitting: Cardiovascular Disease

## 2016-01-27 ENCOUNTER — Telehealth: Payer: Self-pay | Admitting: *Deleted

## 2016-01-27 VITALS — BP 200/98 | HR 85 | Ht 73.0 in | Wt 187.0 lb

## 2016-01-27 DIAGNOSIS — E7800 Pure hypercholesterolemia, unspecified: Secondary | ICD-10-CM

## 2016-01-27 DIAGNOSIS — I480 Paroxysmal atrial fibrillation: Secondary | ICD-10-CM | POA: Diagnosis not present

## 2016-01-27 DIAGNOSIS — I4892 Unspecified atrial flutter: Secondary | ICD-10-CM

## 2016-01-27 DIAGNOSIS — I10 Essential (primary) hypertension: Secondary | ICD-10-CM | POA: Diagnosis not present

## 2016-01-27 DIAGNOSIS — I2510 Atherosclerotic heart disease of native coronary artery without angina pectoris: Secondary | ICD-10-CM

## 2016-01-27 DIAGNOSIS — I701 Atherosclerosis of renal artery: Secondary | ICD-10-CM

## 2016-01-27 DIAGNOSIS — I739 Peripheral vascular disease, unspecified: Secondary | ICD-10-CM

## 2016-01-27 DIAGNOSIS — I350 Nonrheumatic aortic (valve) stenosis: Secondary | ICD-10-CM

## 2016-01-27 LAB — CBC WITH DIFFERENTIAL/PLATELET
BASOS PCT: 1 %
Basophils Absolute: 70 cells/uL (ref 0–200)
EOS ABS: 280 {cells}/uL (ref 15–500)
Eosinophils Relative: 4 %
HCT: 45.2 % (ref 38.5–50.0)
Hemoglobin: 14.9 g/dL (ref 13.2–17.1)
LYMPHS ABS: 1680 {cells}/uL (ref 850–3900)
LYMPHS PCT: 24 %
MCH: 30.5 pg (ref 27.0–33.0)
MCHC: 33 g/dL (ref 32.0–36.0)
MCV: 92.4 fL (ref 80.0–100.0)
MONO ABS: 770 {cells}/uL (ref 200–950)
MPV: 11.2 fL (ref 7.5–12.5)
Monocytes Relative: 11 %
NEUTROS PCT: 60 %
Neutro Abs: 4200 cells/uL (ref 1500–7800)
PLATELETS: 192 10*3/uL (ref 140–400)
RBC: 4.89 MIL/uL (ref 4.20–5.80)
RDW: 13.7 % (ref 11.0–15.0)
WBC: 7 10*3/uL (ref 3.8–10.8)

## 2016-01-27 LAB — BASIC METABOLIC PANEL
BUN: 17 mg/dL (ref 7–25)
CHLORIDE: 104 mmol/L (ref 98–110)
CO2: 26 mmol/L (ref 20–31)
Calcium: 9.8 mg/dL (ref 8.6–10.3)
Creat: 0.85 mg/dL (ref 0.70–1.11)
GLUCOSE: 193 mg/dL — AB (ref 65–99)
POTASSIUM: 4.9 mmol/L (ref 3.5–5.3)
SODIUM: 139 mmol/L (ref 135–146)

## 2016-01-27 NOTE — Telephone Encounter (Signed)
Called to review instructions on taking his diabetic medications prior to procedure. Pt did not answer. Message left to call back.  Pt needs to know to take half his dose of lantus the night before and to hold his metformin for 24 hours prior to the procedure. Please inform if pt calls back.

## 2016-01-27 NOTE — Assessment & Plan Note (Signed)
History of hyperlipidemia on atorvastatin with recent lipid profile performed 06/25/15 revealing total cholesterol 120, LDL 69 and HDL of 32.

## 2016-01-27 NOTE — Assessment & Plan Note (Signed)
History of coronary artery disease status post coronary artery bypass graftingX 08 March 1999 for the LIMA to his LAD, vein to the right coronary artery, obtuse marginal branch and ramus branch.He denies chest pain or shortness of breath.His last functional study performed 2/14 was nonischemic.

## 2016-01-27 NOTE — Progress Notes (Signed)
01/27/2016 Charles [name]   03-05-1932  [id number]  Primary Physician Charles Bush, MD Primary Cardiologist: Charles Harp MD Charles [name]   HPI:  The patient is a delightful, 82 year old, mildly overweight, married Caucasian male whose wife was also a patient of mine. She unfortunately passed away 07-21-2015. He currently lives with one of his sons and appears to be fairly depressed. He remarried for 51 years.He is a father of 10, grandfather to 4 grandchildren. I last saw him a year ago. He has a history of CAD status post coronary artery bypass grafting x4 June 2004 with a LIMA to his LAD; a vein to the right coronary artery, obtuse marginal branch and ramus branch. He also was found to have 70% bilateral renal artery stenosis which we have been following by duplex ultrasound. He has had left carotid endarterectomy which we follow as well. His other problems include hypertension, hyperlipidemia and noninsulin-requiring diabetes. He is asymptomatic. His last functional study performed 2/14 was nonischemic. he denies chest pain or shortness of breath. He had new onset atrial flutter back in the fall and underwent TEE guided DC cardioversion by Dr. Debara [name] 06/25/15 successfully to sinus rhythm. He was placed on Eliquis oral anticoagulation but has ultimately decided not to take this for various reasons.recent renal Dopplers have suggested progression of disease bilaterally left greater than right. His blood pressures have been more difficult to control as well. In addition, his echo performed 06/13/15 was significant for moderate to severe aortic stenosis with a valve area of 0.9 cm2 and a peak gradient of 49 mmHg.   Current Outpatient Prescriptions  Medication Sig Dispense Refill  . atorvastatin (LIPITOR) 20 MG tablet Take 20 mg by mouth daily at 6 PM.    . BAYER MICROLET LANCETS lancets Ck blood sugar three times daily and as directed. Dx 250.00. Insulin dependent 100 each 11  .  citalopram (CELEXA) 20 MG tablet Take 1 tablet (20 mg total) by mouth daily.    . citalopram (CELEXA) 20 MG tablet Take 1 tablet (20 mg total) by mouth daily. 30 tablet 6  . Cyanocobalamin (B-12) 500 MCG TABS Take 1 tablet by mouth daily.    . furosemide (LASIX) 40 MG tablet Take 40 mg by mouth as needed.     . furosemide (LASIX) 40 MG tablet Take 1 tablet (40 mg total) by mouth daily. 30 tablet 3  . glucose blood (BAYER CONTOUR NEXT TEST) test strip Ck blood sugar three times daily and as directed. Dx 250.00 insulin dependent 100 each 11  . Insulin Syringe-Needle U-100 (B-D INS SYR ULTRAFINE 1CC/30G) 30G X 1/2" 1 ML MISC 1 Syringe by Does not apply route as directed. 100 each 3  . LANTUS 100 UNIT/ML injection Inject 0.28 mLs (28 Units total) into the skin at bedtime. 10 mL 11  . lisinopril (PRINIVIL,ZESTRIL) 40 MG tablet Take 1 tablet (40 mg total) by mouth daily. 90 tablet 3  . LORazepam (ATIVAN) 0.5 MG tablet Take 0.5-1 tablets (0.25-0.5 mg total) by mouth 2 (two) times daily as needed for anxiety. 30 tablet 0  . metFORMIN (GLUCOPHAGE) 1000 MG tablet Take 1 tablet (1,000 mg total) by mouth 2 (two) times daily with a meal.    . metoprolol tartrate (LOPRESSOR) 25 MG tablet Take 1 tablet (25 mg total) by mouth 2 (two) times daily. 180 tablet 3  . pantoprazole (PROTONIX) 20 MG tablet Take 1 tablet (20 mg total) by mouth daily. 30 tablet 6  .  potassium chloride SA (K-DUR,KLOR-CON) 20 MEQ tablet Take 20 mEq by mouth daily.     . saw palmetto 160 MG capsule Take 450 mg by mouth daily.     . traZODone (DESYREL) 50 MG tablet Take 2 tablets (100 mg total) by mouth at bedtime. 180 tablet 3  . vitamin C (ASCORBIC ACID) 500 MG tablet Take 500 mg by mouth daily.     No current facility-administered medications for this visit.    Allergies  Allergen Reactions  . Amlodipine Other (See Comments)    Edema  . Other Other (See Comments)    myalgias Horse serum Horse serum  . Rosiglitazone Other (See  Comments)    REACTION: Did not help  . Rosiglitazone Maleate     REACTION: Did not help  . Tricor [Fenofibrate] Other (See Comments)    myalgias    Social History   Social History  . Marital Status: Married    Spouse Name: N/A  . Number of Children: N/A  . Years of Education: N/A   Occupational History  . Not on file.   Social History Main Topics  . Smoking status: Former Smoker -- 0.50 packs/day for 36 years    Types: Cigarettes    Quit date: 07/23/2013  . Smokeless tobacco: Never Used  . Alcohol Use: No  . Drug Use: No  . Sexual Activity: Not Currently   Other Topics Concern  . Not on file   Social History Narrative   Caffeine: 4 cups coffee/day, some tea and soda   Lives with wife   Occupation: Retired   Activity: no regular exercise   Diet: good water, fruits/vegetables daily     Review of Systems: General: negative for chills, fever, night sweats or weight changes.  Cardiovascular: negative for chest pain, dyspnea on exertion, edema, orthopnea, palpitations, paroxysmal nocturnal dyspnea or shortness of breath Dermatological: negative for rash Respiratory: negative for cough or wheezing Urologic: negative for hematuria Abdominal: negative for nausea, vomiting, diarrhea, bright red blood per rectum, melena, or hematemesis Neurologic: negative for visual changes, syncope, or dizziness All other systems reviewed and are otherwise negative except as noted above.    Blood pressure 200/98, pulse 85, height 6\' 1"  (1.854 m), weight 187 lb (84.823 kg).  General appearance: alert and no distress Neck: no adenopathy, no JVD, supple, symmetrical, trachea midline, thyroid not enlarged, symmetric, no tenderness/mass/nodules and bilateral carotid bruits Lungs: clear to auscultation bilaterally Heart: 2/6 systolic ejection murmur at the base consistent with aortic stenosis Extremities: extremities normal, atraumatic, no cyanosis or edema  EKG not performed  today  ASSESSMENT AND PLAN:   HYPERCHOLESTEROLEMIA History of hyperlipidemia on atorvastatin with recent lipid profile performed 06/25/15 revealing total cholesterol 120, LDL 69 and HDL of 32.  Essential hypertension History of hypertension with known angiographic documented 70% bilateral renal artery stenosis at the time of cath in 2004. We've been following him by duplex ultrasound. His blood pressure today is 200/98 He is on lisinopril and metoprolol. His renal Doppler study have shown significant worsening over the last 2 years It's possible that he now has a renal vascular component to his resistant hypertension. We discussed options and decide to proceed with angiography and potential intervention for this  Coronary atherosclerosis History of coronary artery disease status post coronary artery bypass graftingX 08 March 1999 for the LIMA to his LAD, vein to the right coronary artery, obtuse marginal branch and ramus branch.He denies chest pain or shortness of breath.His last functional study performed  2/14 was nonischemic.  Atrial flutter (HCC) History of atrial flutter which was symptomatic. He recently underwent cardioversion by Dr. Debara [name] 06/25/15 successfully to sinus rhythm (TEE).he was placed on Eliquis oral and coagulation but has since decided to discontinue this because of cost considerations also for fear bleeding.  Aortic stenosis History of moderate aortic stenosis with left echo performed [id number] normal LV systolic function, and aortic valve area of 0.9 cm2 peak gradient of 49 mmHg. We'll continue to monitor this patient currently denies chest pain or shortness of breath.      Charles Harp MD FACP,FACC,FAHA, [facility] 01/27/2016 2:16 PM

## 2016-01-27 NOTE — Patient Instructions (Addendum)
Medication Instructions:  Your physician recommends that you continue on your current medications as directed. Please refer to the Current Medication list given to you today.   Testing/Procedures: Dr. [name] has ordered a peripheral angiogram and renal angiogram to be done at [facility].  This procedure is going to look at the bloodflow in your lower extremities.  If Dr. [name] is able to open up the arteries, you will have to spend one night in the hospital.  If he is not able to open the arteries, you will be able to go home that same day.  Schedule for Monday,  May 1st.  After the procedure, you will not be allowed to drive for 3 days or push, pull, or lift anything greater than 10 lbs for one week.    You will be required to have the following tests prior to the procedure:  1. Blood work-the blood work can be done no more than 7 days prior to the procedure.  It can be done at any [facility] lab.  There is one downstairs on the first floor of this building and one in the [facility] building [phone] [address])    *REPS-none  Puncture site right groin.   Your physician has requested that you have an echocardiogram in September 2017. Echocardiography is a painless test that uses sound waves to create images of your heart. It provides your doctor with information about the size and shape of your heart and how well your heart's chambers and valves are working. This procedure takes approximately one hour. There are no restrictions for this procedure.    Any Other Special Instructions Will Be Listed Below (If Applicable).     If you need a refill on your cardiac medications before your next appointment, please call your pharmacy.

## 2016-01-27 NOTE — Assessment & Plan Note (Signed)
History of atrial flutter which was symptomatic. He recently underwent cardioversion by Dr. Debara Pickett 06/25/15 successfully to sinus rhythm (TEE).he was placed on Eliquis oral and coagulation but has since decided to discontinue this because of cost considerations also for fear bleeding.

## 2016-01-27 NOTE — Assessment & Plan Note (Signed)
History of hypertension with known angiographic documented 70% bilateral renal artery stenosis at the time of cath in 2004. We've been following him by duplex ultrasound. His blood pressure today is 200/98 He is on lisinopril and metoprolol. His renal Doppler study have shown significant worsening over the last 2 years It's possible that he now has a renal vascular component to his resistant hypertension. We discussed options and decide to proceed with angiography and potential intervention for this

## 2016-01-27 NOTE — Assessment & Plan Note (Signed)
History of moderate aortic stenosis with left echo performed [id number] normal LV systolic function, and aortic valve area of 0.9 cm2 peak gradient of 49 mmHg. We'll continue to monitor this patient currently denies chest pain or shortness of breath.

## 2016-01-28 LAB — PROTIME-INR
INR: 0.95 (ref <1.50)
Prothrombin Time: 12.8 seconds (ref 11.6–15.2)

## 2016-01-28 LAB — APTT: APTT: 30 s (ref 24–37)

## 2016-01-28 NOTE — Telephone Encounter (Signed)
This encounter was created in error - please disregard.

## 2016-01-28 NOTE — Telephone Encounter (Addendum)
Tried to reach pt; no VM setup at this time.   Tried pt wife phone, it is disconnected, per DPR okay to speak with her.  Left message to call back on son, [place]. Okay per [facility]

## 2016-01-30 ENCOUNTER — Telehealth: Payer: Self-pay | Admitting: Cardiovascular Disease

## 2016-01-30 NOTE — Telephone Encounter (Signed)
Follow Up  ° °Pt returned the call  °

## 2016-01-30 NOTE — Telephone Encounter (Signed)
Spoke to [place], patient's son (DPR) regarding upcoming cath. Instructed that the patient should take half his dose of lantus the night before and to hold his metformin for 24 hours prior to the procedure. Aware pt needs to fast after midnight. Scott voiced understanding of instructions. If anything further needs to be communicated, he is aware nurse will call. Routed to [employer]

## 2016-02-02 ENCOUNTER — Ambulatory Visit (HOSPITAL_COMMUNITY)
Admission: RE | Admit: 2016-02-02 | Discharge: 2016-02-02 | Disposition: A | Discharge status: Home / Self Care | Payer: Medicare Other | Source: Ambulatory Visit | Attending: Cardiovascular Disease | Admitting: Cardiovascular Disease

## 2016-02-02 ENCOUNTER — Encounter (HOSPITAL_COMMUNITY)
Admission: RE | Disposition: A | Discharge status: Home / Self Care | Payer: Self-pay | Source: Ambulatory Visit | Attending: Cardiovascular Disease

## 2016-02-02 DIAGNOSIS — I350 Nonrheumatic aortic (valve) stenosis: Secondary | ICD-10-CM | POA: Insufficient documentation

## 2016-02-02 DIAGNOSIS — Z6824 Body mass index (BMI) 24.0-24.9, adult: Secondary | ICD-10-CM | POA: Insufficient documentation

## 2016-02-02 DIAGNOSIS — I4892 Unspecified atrial flutter: Secondary | ICD-10-CM | POA: Insufficient documentation

## 2016-02-02 DIAGNOSIS — Z87891 Personal history of nicotine dependence: Secondary | ICD-10-CM | POA: Diagnosis not present

## 2016-02-02 DIAGNOSIS — I701 Atherosclerosis of renal artery: Secondary | ICD-10-CM | POA: Insufficient documentation

## 2016-02-02 DIAGNOSIS — I2510 Atherosclerotic heart disease of native coronary artery without angina pectoris: Secondary | ICD-10-CM | POA: Diagnosis present

## 2016-02-02 DIAGNOSIS — I151 Hypertension secondary to other renal disorders: Secondary | ICD-10-CM | POA: Diagnosis not present

## 2016-02-02 DIAGNOSIS — I739 Peripheral vascular disease, unspecified: Secondary | ICD-10-CM

## 2016-02-02 DIAGNOSIS — E663 Overweight: Secondary | ICD-10-CM | POA: Insufficient documentation

## 2016-02-02 DIAGNOSIS — I10 Essential (primary) hypertension: Secondary | ICD-10-CM | POA: Diagnosis not present

## 2016-02-02 DIAGNOSIS — Z794 Long term (current) use of insulin: Secondary | ICD-10-CM | POA: Insufficient documentation

## 2016-02-02 DIAGNOSIS — Z951 Presence of aortocoronary bypass graft: Secondary | ICD-10-CM | POA: Insufficient documentation

## 2016-02-02 DIAGNOSIS — E785 Hyperlipidemia, unspecified: Secondary | ICD-10-CM | POA: Insufficient documentation

## 2016-02-02 DIAGNOSIS — S32010A Wedge compression fracture of first lumbar vertebra, initial encounter for closed fracture: Secondary | ICD-10-CM

## 2016-02-02 DIAGNOSIS — E119 Type 2 diabetes mellitus without complications: Secondary | ICD-10-CM | POA: Diagnosis not present

## 2016-02-02 DIAGNOSIS — E7800 Pure hypercholesterolemia, unspecified: Secondary | ICD-10-CM | POA: Insufficient documentation

## 2016-02-02 DIAGNOSIS — Z7984 Long term (current) use of oral hypoglycemic drugs: Secondary | ICD-10-CM | POA: Insufficient documentation

## 2016-02-02 HISTORY — DX: Wedge compression fracture of first lumbar vertebra, initial encounter for closed fracture: S32.010A

## 2016-02-02 HISTORY — PX: PERIPHERAL VASCULAR CATHETERIZATION: SHX172C

## 2016-02-02 LAB — GLUCOSE, CAPILLARY
GLUCOSE-CAPILLARY: 149 mg/dL — AB (ref 65–99)
Glucose-Capillary: 137 mg/dL — ABNORMAL HIGH (ref 65–99)

## 2016-02-02 SURGERY — RENAL ANGIOGRAPHY
Anesthesia: LOCAL

## 2016-02-02 MED ORDER — HYDRALAZINE HCL 20 MG/ML IJ SOLN: 10.0000 mg | INTRAMUSCULAR | Status: DC | PRN

## 2016-02-02 MED ORDER — MORPHINE SULFATE (PF) 2 MG/ML IV SOLN: 2.0000 mg | INTRAVENOUS | Status: DC | PRN

## 2016-02-02 MED ORDER — ASPIRIN 81 MG PO CHEW
CHEWABLE_TABLET | ORAL | Status: AC
  Administered 2016-02-02: 81 mg via ORAL
  Filled 2016-02-02: qty 1

## 2016-02-02 MED ORDER — ASPIRIN 81 MG PO CHEW
81.0000 mg | CHEWABLE_TABLET | ORAL | Status: AC
  Administered 2016-02-02: 81 mg via ORAL

## 2016-02-02 MED ORDER — SODIUM CHLORIDE 0.9 % WEIGHT BASED INFUSION
3.0000 mL/kg/h | INTRAVENOUS | Status: DC
  Administered 2016-02-02: 3 mL/kg/h via INTRAVENOUS

## 2016-02-02 MED ORDER — ACETAMINOPHEN 325 MG PO TABS: 650.0000 mg | ORAL_TABLET | ORAL | Status: DC | PRN

## 2016-02-02 MED ORDER — SODIUM CHLORIDE 0.9 % IV SOLN: INTRAVENOUS | Status: DC

## 2016-02-02 MED ORDER — LIDOCAINE HCL (PF) 1 % IJ SOLN
INTRAMUSCULAR | Status: DC | PRN
  Administered 2016-02-02: 25 mL

## 2016-02-02 MED ORDER — HEPARIN (PORCINE) IN NACL 2-0.9 UNIT/ML-% IJ SOLN
INTRAMUSCULAR | Status: DC | PRN
  Administered 2016-02-02: 1000 mL

## 2016-02-02 MED ORDER — SODIUM CHLORIDE 0.9% FLUSH: 3.0000 mL | INTRAVENOUS | Status: DC | PRN

## 2016-02-02 MED ORDER — ONDANSETRON HCL 4 MG/2ML IJ SOLN: 4.0000 mg | Freq: Four times a day (QID) | INTRAMUSCULAR | Status: DC | PRN

## 2016-02-02 MED ORDER — SODIUM CHLORIDE 0.9 % WEIGHT BASED INFUSION: 1.0000 mL/kg/h | INTRAVENOUS | Status: DC

## 2016-02-02 MED ORDER — IODIXANOL 320 MG/ML IV SOLN
INTRAVENOUS | Status: DC | PRN
  Administered 2016-02-02: 65 mL via INTRA_ARTERIAL

## 2016-02-02 MED ORDER — HEPARIN (PORCINE) IN NACL 2-0.9 UNIT/ML-% IJ SOLN
INTRAMUSCULAR | Status: AC
  Filled 2016-02-02: qty 1000

## 2016-02-02 MED ORDER — LIDOCAINE HCL (PF) 1 % IJ SOLN
INTRAMUSCULAR | Status: AC
  Filled 2016-02-02: qty 30

## 2016-02-02 SURGICAL SUPPLY — 12 items
CATH ANGIO 5F PIGTAIL 65CM (CATHETERS) ×2 IMPLANT
GUIDE CATH VISTA JR4 6F (CATHETERS) ×4 IMPLANT
KIT PV (KITS) ×2 IMPLANT
SHEATH BRITE TIP 6FR 35CM (SHEATH) ×2 IMPLANT
SHEATH PINNACLE 6F 10CM (SHEATH) ×2 IMPLANT
STOPCOCK MORSE 400PSI 3WAY (MISCELLANEOUS) ×2 IMPLANT
SYRINGE MEDRAD AVANTA MACH 7 (SYRINGE) ×2 IMPLANT
TRANSDUCER W/STOPCOCK (MISCELLANEOUS) ×2 IMPLANT
TRAY PV CATH (CUSTOM PROCEDURE TRAY) ×2 IMPLANT
TUBING CIL FLEX 10 FLL-RA (TUBING) ×2 IMPLANT
WIRE HITORQ VERSACORE ST 145CM (WIRE) ×2 IMPLANT
WIRE ROSEN-J .035X180CM (WIRE) ×2 IMPLANT

## 2016-02-02 NOTE — H&P (View-Only) (Signed)
01/27/2016 UBER BARWICK   July 01, 1932  [id number]  Primary Physician [name], MD Primary Cardiologist: [name] MD [name]   HPI:  The patient is a delightful, 82 year old, mildly overweight, married Caucasian male whose wife was also a patient of mine. She unfortunately passed away 08-08-2015. He currently lives with one of his sons and appears to be fairly depressed. He remarried for 51 years.He is a father of 10, grandfather to 4 grandchildren. I last saw him a year ago. He has a history of CAD status post coronary artery bypass grafting x4 June 2004 with a LIMA to his LAD; a vein to the right coronary artery, obtuse marginal branch and ramus branch. He also was found to have 70% bilateral renal artery stenosis which we have been following by duplex ultrasound. He has had left carotid endarterectomy which we follow as well. His other problems include hypertension, hyperlipidemia and noninsulin-requiring diabetes. He is asymptomatic. His last functional study performed 2/14 was nonischemic. he denies chest pain or shortness of breath. He had new onset atrial flutter back in the fall and underwent TEE guided DC cardioversion by Dr. Debara Pickett 06/25/15 successfully to sinus rhythm. He was placed on Eliquis oral anticoagulation but has ultimately decided not to take this for various reasons.recent renal Dopplers have suggested progression of disease bilaterally left greater than right. His blood pressures have been more difficult to control as well. In addition, his echo performed 06/13/15 was significant for moderate to severe aortic stenosis with a valve area of 0.9 cm2 and a peak gradient of 49 mmHg.   Current Outpatient Prescriptions  Medication Sig Dispense Refill  . atorvastatin (LIPITOR) 20 MG tablet Take 20 mg by mouth daily at 6 PM.    . BAYER MICROLET LANCETS lancets Ck blood sugar three times daily and as directed. Dx 250.00. Insulin dependent 100 each 11  .  citalopram (CELEXA) 20 MG tablet Take 1 tablet (20 mg total) by mouth daily.    . citalopram (CELEXA) 20 MG tablet Take 1 tablet (20 mg total) by mouth daily. 30 tablet 6  . Cyanocobalamin (B-12) 500 MCG TABS Take 1 tablet by mouth daily.    . furosemide (LASIX) 40 MG tablet Take 40 mg by mouth as needed.     . furosemide (LASIX) 40 MG tablet Take 1 tablet (40 mg total) by mouth daily. 30 tablet 3  . glucose blood (BAYER CONTOUR NEXT TEST) test strip Ck blood sugar three times daily and as directed. Dx 250.00 insulin dependent 100 each 11  . Insulin Syringe-Needle U-100 (B-D INS SYR ULTRAFINE 1CC/30G) 30G X 1/2" 1 ML MISC 1 Syringe by Does not apply route as directed. 100 each 3  . LANTUS 100 UNIT/ML injection Inject 0.28 mLs (28 Units total) into the skin at bedtime. 10 mL 11  . lisinopril (PRINIVIL,ZESTRIL) 40 MG tablet Take 1 tablet (40 mg total) by mouth daily. 90 tablet 3  . LORazepam (ATIVAN) 0.5 MG tablet Take 0.5-1 tablets (0.25-0.5 mg total) by mouth 2 (two) times daily as needed for anxiety. 30 tablet 0  . metFORMIN (GLUCOPHAGE) 1000 MG tablet Take 1 tablet (1,000 mg total) by mouth 2 (two) times daily with a meal.    . metoprolol tartrate (LOPRESSOR) 25 MG tablet Take 1 tablet (25 mg total) by mouth 2 (two) times daily. 180 tablet 3  . pantoprazole (PROTONIX) 20 MG tablet Take 1 tablet (20 mg total) by mouth daily. 30 tablet 6  .  potassium chloride SA (K-DUR,KLOR-CON) 20 MEQ tablet Take 20 mEq by mouth daily.     . saw palmetto 160 MG capsule Take 450 mg by mouth daily.     . traZODone (DESYREL) 50 MG tablet Take 2 tablets (100 mg total) by mouth at bedtime. 180 tablet 3  . vitamin C (ASCORBIC ACID) 500 MG tablet Take 500 mg by mouth daily.     No current facility-administered medications for this visit.    Allergies  Allergen Reactions  . Amlodipine Other (See Comments)    Edema  . Other Other (See Comments)    myalgias Horse serum Horse serum  . Rosiglitazone Other (See  Comments)    REACTION: Did not help  . Rosiglitazone Maleate     REACTION: Did not help  . Tricor [Fenofibrate] Other (See Comments)    myalgias    Social History   Social History  . Marital Status: Married    Spouse Name: N/A  . Number of Children: N/A  . Years of Education: N/A   Occupational History  . Not on file.   Social History Main Topics  . Smoking status: Former Smoker -- 0.50 packs/day for 36 years    Types: Cigarettes    Quit date: 07/23/2013  . Smokeless tobacco: Never Used  . Alcohol Use: No  . Drug Use: No  . Sexual Activity: Not Currently   Other Topics Concern  . Not on file   Social History Narrative   Caffeine: 4 cups coffee/day, some tea and soda   Lives with wife   Occupation: Retired   Activity: no regular exercise   Diet: good water, fruits/vegetables daily     Review of Systems: General: negative for chills, fever, night sweats or weight changes.  Cardiovascular: negative for chest pain, dyspnea on exertion, edema, orthopnea, palpitations, paroxysmal nocturnal dyspnea or shortness of breath Dermatological: negative for rash Respiratory: negative for cough or wheezing Urologic: negative for hematuria Abdominal: negative for nausea, vomiting, diarrhea, bright red blood per rectum, melena, or hematemesis Neurologic: negative for visual changes, syncope, or dizziness All other systems reviewed and are otherwise negative except as noted above.    Blood pressure 200/98, pulse 85, height 6\' 1"  (1.854 m), weight 187 lb (84.823 kg).  General appearance: alert and no distress Neck: no adenopathy, no JVD, supple, symmetrical, trachea midline, thyroid not enlarged, symmetric, no tenderness/mass/nodules and bilateral carotid bruits Lungs: clear to auscultation bilaterally Heart: 2/6 systolic ejection murmur at the base consistent with aortic stenosis Extremities: extremities normal, atraumatic, no cyanosis or edema  EKG not performed  today  ASSESSMENT AND PLAN:   HYPERCHOLESTEROLEMIA History of hyperlipidemia on atorvastatin with recent lipid profile performed 06/25/15 revealing total cholesterol 120, LDL 69 and HDL of 32.  Essential hypertension History of hypertension with known angiographic documented 70% bilateral renal artery stenosis at the time of cath in 2004. We've been following him by duplex ultrasound. His blood pressure today is 200/98 He is on lisinopril and metoprolol. His renal Doppler study have shown significant worsening over the last 2 years It's possible that he now has a renal vascular component to his resistant hypertension. We discussed options and decide to proceed with angiography and potential intervention for this  Coronary atherosclerosis History of coronary artery disease status post coronary artery bypass graftingX 08 March 1999 for the LIMA to his LAD, vein to the right coronary artery, obtuse marginal branch and ramus branch.He denies chest pain or shortness of breath.His last functional study performed  2/14 was nonischemic.  Atrial flutter (HCC) History of atrial flutter which was symptomatic. He recently underwent cardioversion by Dr. Debara Pickett 06/25/15 successfully to sinus rhythm (TEE).he was placed on Eliquis oral and coagulation but has since decided to discontinue this because of cost considerations also for fear bleeding.  Aortic stenosis History of moderate aortic stenosis with left echo performed [id number] normal LV systolic function, and aortic valve area of 0.9 cm2 peak gradient of 49 mmHg. We'll continue to monitor this patient currently denies chest pain or shortness of breath.      [name] MD FACP,FACC,FAHA, [facility] 01/27/2016 2:16 PM

## 2016-02-02 NOTE — Interval H&P Note (Signed)
History and Physical Interval Note:  02/02/2016 9:11 AM  Charles [name]  has presented today for surgery, with the diagnosis of HTN  The various methods of treatment have been discussed with the patient and family. After consideration of risks, benefits and other options for treatment, the patient has consented to  Procedure(s): Renal Angiography (N/A) Lower Extremity Angiography (N/A) Abdominal Aortogram (N/A) as a surgical intervention .  The patient's history has been reviewed, patient examined, no change in status, stable for surgery.  I have reviewed the patient's chart and labs.  Questions were answered to the patient's satisfaction.     [name]

## 2016-02-02 NOTE — Discharge Instructions (Signed)
Angiogram, Care After °Refer to this sheet in the next few weeks. These instructions provide you with information about caring for yourself after your procedure. Your health care provider may also give you more specific instructions. Your treatment has been planned according to current medical practices, but problems sometimes occur. Call your health care provider if you have any problems or questions after your procedure. °WHAT TO EXPECT AFTER THE PROCEDURE °After your procedure, it is typical to have the following: °· Bruising at the catheter insertion site that usually fades within 1-2 weeks. °· Blood collecting in the tissue (hematoma) that may be painful to the touch. It should usually decrease in size and tenderness within 1-2 weeks. °HOME CARE INSTRUCTIONS °· Take medicines only as directed by your health care provider. °· You may shower 24-48 hours after the procedure or as directed by your health care provider. Remove the bandage (dressing) and gently wash the site with plain soap and water. Pat the area dry with a clean towel. Do not rub the site, because this may cause bleeding. °· Do not take baths, swim, or use a hot tub until your health care provider approves. °· Check your insertion site every day for redness, swelling, or drainage. °· Do not apply powder or lotion to the site. °· Do not lift over 10 lb (4.5 kg) for 5 days after your procedure or as directed by your health care provider. °· Ask your health care provider when it is okay to: °¨ Return to work or school. °¨ Resume usual physical activities or sports. °¨ Resume sexual activity. °· Do not drive home if you are discharged the same day as the procedure. Have someone else drive you. °· You may drive 24 hours after the procedure unless otherwise instructed by your health care provider. °· Do not operate machinery or power tools for 24 hours after the procedure or as directed by your health care provider. °· If your procedure was done as an  outpatient procedure, which means that you went home the same day as your procedure, a responsible adult should be with you for the first 24 hours after you arrive home. °· Keep all follow-up visits as directed by your health care provider. This is important. °SEEK MEDICAL CARE IF: °· You have a fever. °· You have chills. °· You have increased bleeding from the catheter insertion site. Hold pressure on the site.  CALL 911 °SEEK IMMEDIATE MEDICAL CARE IF: °· You have unusual pain at the catheter insertion site. °· You have redness, warmth, or swelling at the catheter insertion site. °· You have drainage (other than a small amount of blood on the dressing) from the catheter insertion site. °· The catheter insertion site is bleeding, and the bleeding does not stop after 30 minutes of holding steady pressure on the site. °· The area near or just beyond the catheter insertion site becomes pale, cool, tingly, or numb. °  °This information is not intended to replace advice given to you by your health care provider. Make sure you discuss any questions you have with your health care provider. °  °Document Released: 04/08/2005 Document Revised: 10/11/2014 Document Reviewed: 02/21/2013 °Elsevier Interactive Patient Education ©2016 [employer] ° °

## 2016-02-03 ENCOUNTER — Encounter (HOSPITAL_COMMUNITY): Payer: Self-pay | Admitting: Cardiovascular Disease

## 2016-02-17 ENCOUNTER — Ambulatory Visit (INDEPENDENT_AMBULATORY_CARE_PROVIDER_SITE_OTHER): Payer: Medicare Other | Admitting: Family Medicine

## 2016-02-17 ENCOUNTER — Ambulatory Visit (INDEPENDENT_AMBULATORY_CARE_PROVIDER_SITE_OTHER)
Admission: RE | Admit: 2016-02-17 | Discharge: 2016-02-17 | Disposition: A | Discharge status: Home / Self Care | Payer: Medicare Other | Source: Ambulatory Visit | Attending: Family Medicine | Admitting: Family Medicine

## 2016-02-17 ENCOUNTER — Encounter: Payer: Self-pay | Admitting: Family Medicine

## 2016-02-17 VITALS — BP 170/88 | HR 68 | Temp 97.8°F | Wt 184.5 lb

## 2016-02-17 DIAGNOSIS — F331 Major depressive disorder, recurrent, moderate: Secondary | ICD-10-CM

## 2016-02-17 DIAGNOSIS — M5417 Radiculopathy, lumbosacral region: Secondary | ICD-10-CM

## 2016-02-17 DIAGNOSIS — M5416 Radiculopathy, lumbar region: Secondary | ICD-10-CM

## 2016-02-17 DIAGNOSIS — I10 Essential (primary) hypertension: Secondary | ICD-10-CM | POA: Diagnosis not present

## 2016-02-17 DIAGNOSIS — E118 Type 2 diabetes mellitus with unspecified complications: Secondary | ICD-10-CM

## 2016-02-17 DIAGNOSIS — M5136 Other intervertebral disc degeneration, lumbar region: Secondary | ICD-10-CM

## 2016-02-17 DIAGNOSIS — E1165 Type 2 diabetes mellitus with hyperglycemia: Secondary | ICD-10-CM | POA: Diagnosis not present

## 2016-02-17 DIAGNOSIS — IMO0002 Reserved for concepts with insufficient information to code with codable children: Secondary | ICD-10-CM

## 2016-02-17 DIAGNOSIS — M47816 Spondylosis without myelopathy or radiculopathy, lumbar region: Secondary | ICD-10-CM | POA: Diagnosis not present

## 2016-02-17 DIAGNOSIS — Z794 Long term (current) use of insulin: Secondary | ICD-10-CM | POA: Diagnosis not present

## 2016-02-17 DIAGNOSIS — K1370 Unspecified lesions of oral mucosa: Secondary | ICD-10-CM | POA: Insufficient documentation

## 2016-02-17 DIAGNOSIS — G8929 Other chronic pain: Secondary | ICD-10-CM | POA: Insufficient documentation

## 2016-02-17 DIAGNOSIS — I701 Atherosclerosis of renal artery: Secondary | ICD-10-CM

## 2016-02-17 LAB — HEMOGLOBIN A1C: Hgb A1c MFr Bld: 8.2 % — ABNORMAL HIGH (ref 4.6–6.5)

## 2016-02-17 MED ORDER — NYSTATIN 100000 UNIT/ML MT SUSP: 5.0000 mL | Freq: Three times a day (TID) | OROMUCOSAL | Status: DC

## 2016-02-17 MED ORDER — ACETAMINOPHEN 500 MG PO TABS: 500.0000 mg | ORAL_TABLET | Freq: Three times a day (TID) | ORAL | Status: AC

## 2016-02-17 NOTE — Progress Notes (Signed)
Pre visit review using our clinic review tool, if applicable. No additional management support is needed unless otherwise documented below in the visit note. 

## 2016-02-17 NOTE — Progress Notes (Signed)
BP 170/88 mmHg  Pulse 68  Temp(Src) 97.8 F (36.6 C) (Oral)  Wt 184 lb 8 oz (83.689 kg)   CC: discuss back and hip pain.  Subjective:    Patient ID: Charles [name], male    DOB: 1932/06/07, 82 y.o.   MRN: [id number]  HPI: Charles [name] is a 82 y.o. male presenting on 02/17/2016 for Back Pain and Hip Pain   Last seen here 09/2015. Going through move to sell house and move into smaller apartment. Still going through grieving.   HTN - compliant with lisinopril 40mg  daily, lasix 40mg  daily, and metoprolol 25mg  bid. Doesn't check bp at home.   Endorses lower back pain with radiation down R hip ongoing over last 6-7 months. Denies inciting trauma/falls. Denies numbness/weakness of legs. Known lumbar DDD since 2014. Takes tylenol 400mg  in am which helps. Also notes worsening back pain after 4-5 steps so he has to sit down.   Endorses when metformin was stopped he didn't have any back or side pain. (off metformin for 5 days around renal angiogram and abdominal aortogram 02/02/2016). Found didn't need stents.  Lab Results  Component Value Date   HGBA1C 9.0* 09/23/2015  has not regularly been checking with upcoming move, but prior average fasting ranging 150-180. Complaint with lantus 32u daily and metfomin 1000mg  bid.   Also noticing some urinary urge incontinence.   Relevant past medical, surgical, family and social history reviewed and updated as indicated. Interim medical history since our last visit reviewed. Allergies and medications reviewed and updated. Current Outpatient Prescriptions on File Prior to Visit  Medication Sig  . atorvastatin (LIPITOR) 20 MG tablet Take 20 mg by mouth daily at 6 PM.  . BAYER MICROLET LANCETS lancets Ck blood sugar three times daily and as directed. Dx 250.00. Insulin dependent  . citalopram (CELEXA) 20 MG tablet Take 1 tablet (20 mg total) by mouth daily.  . Cyanocobalamin (B-12) 500 MCG TABS Take 1 tablet by mouth daily.  . furosemide (LASIX) 40  MG tablet Take 1 tablet (40 mg total) by mouth daily.  [name] glucose blood (BAYER CONTOUR NEXT TEST) test strip Ck blood sugar three times daily and as directed. Dx 250.00 insulin dependent  . Insulin Syringe-Needle U-100 (B-D INS SYR ULTRAFINE 1CC/30G) 30G X 1/2" 1 ML MISC 1 Syringe by Does not apply route as directed.  [name] lisinopril (PRINIVIL,ZESTRIL) 40 MG tablet Take 1 tablet (40 mg total) by mouth daily.  [name] LORazepam (ATIVAN) 0.5 MG tablet Take 0.5-1 tablets (0.25-0.5 mg total) by mouth 2 (two) times daily as needed for anxiety.  . metFORMIN (GLUCOPHAGE) 1000 MG tablet Take 1 tablet (1,000 mg total) by mouth 2 (two) times daily with a meal.  . metoprolol tartrate (LOPRESSOR) 25 MG tablet Take 1 tablet (25 mg total) by mouth 2 (two) times daily.  . pantoprazole (PROTONIX) 20 MG tablet Take 1 tablet (20 mg total) by mouth daily.  . potassium chloride SA (K-DUR,KLOR-CON) 20 MEQ tablet Take 20 mEq by mouth daily.   . saw palmetto 160 MG capsule Take 450 mg by mouth daily.   . traZODone (DESYREL) 50 MG tablet Take 2 tablets (100 mg total) by mouth at bedtime.  . vitamin C (ASCORBIC ACID) 500 MG tablet Take 500 mg by mouth daily.   No current facility-administered medications on file prior to visit.    Review of Systems Per HPI unless specifically indicated in ROS section     Objective:    BP 170/88  mmHg  Pulse 68  Temp(Src) 97.8 F (36.6 C) (Oral)  Wt 184 lb 8 oz (83.689 kg)  Wt Readings from Last 3 Encounters:  02/17/16 184 lb 8 oz (83.689 kg)  02/02/16 189 lb (85.73 kg)  01/27/16 187 lb (84.823 kg)    Physical Exam  Constitutional: He is oriented to person, place, and time. He appears well-developed and well-nourished. No distress.  HENT:  Mouth/Throat: No oropharyngeal exudate.  White patches posterior oropharynx R>L and at uvula  Eyes: Conjunctivae and EOM are normal. Pupils are equal, round, and reactive to light.  Neck: Normal range of motion. Neck supple.  Cardiovascular:  Normal rate, regular rhythm and intact distal pulses.   Murmur (4/6 SEM) heard. Pulmonary/Chest: Effort normal and breath sounds normal. No respiratory distress. He has no wheezes. He has no rales.  Musculoskeletal: He exhibits no edema.  + pain midline lower lumbar spine No paraspinous mm tenderness + SLR on right No pain with int/ext rotation at hip. Neg FABER. No pain at SIJ or sciatic notch bilaterally.  Mild discomfort at R GTB  Neurological: He is alert and oriented to person, place, and time.  5/5 strenght BLE  Skin: Skin is warm and dry. No rash noted.  Psychiatric: He has a normal mood and affect.  Nursing note and vitals reviewed.  Lab Results  Component Value Date   CREATININE 0.85 01/27/2016       Assessment & Plan:   Problem List Items Addressed This Visit    Diabetes mellitus type 2, uncontrolled, with complications ([place])    Pt attributes back pain to metformin as he felt better off this. Check A1c today. Latest Cr 0.8 (01/2016) so should be able to tolerate med fine. Dosing change will be based on A1c result.      Relevant Medications   insulin glargine (LANTUS) 100 UNIT/ML injection   Other Relevant Orders   Hemoglobin A1c   MDD (major depressive disorder), recurrent episode, moderate (HCC)    Complicated by ongoing bereavement. Continue current regimen of celexa, trazodone, ativan prn.      Essential hypertension    bp elevated today - reviewed recent stable renal and aortic angiograms. Compliant with current regimen. Anticipate pain playing part in elevated blood pressure so no changes made today. Consider increased metoprolol (h/o pedal edema rxn to amlodipine). Recheck at 3wk f/u.       Renal artery stenosis (HCC)    Stable on renal/aortic angiogram 02/2016.       DDD (degenerative disc disease), lumbar   Relevant Medications   acetaminophen (TYLENOL) 500 MG tablet   Lumbar back pain with radiculopathy affecting right lower extremity - Primary     Exam not consistent with hip arthritis etiology. Anticipate lumbar radiculopathy with mild trochanteric bursitis on right. Treat with scheduled tylenol 500mg  TID, update lumbar films. Pt agrees with plan.      Relevant Medications   acetaminophen (TYLENOL) 500 MG tablet   Other Relevant Orders   DG Lumbar Spine Complete (Completed)   Mouth lesion    Anticipate possible thrush - treat with nystatin swish/swallow. Reassess at 3 wk f/u. If persistent lesions, consider ENT referral for further evaluation.  No significant EtOH use. Ex smoker.          Follow up plan: Return in about 3 weeks (around 03/09/2016), or as needed, for follow up visit.  [name], MD

## 2016-02-17 NOTE — Assessment & Plan Note (Signed)
Pt attributes back pain to metformin as he felt better off this. Check A1c today. Latest Cr 0.8 (01/2016) so should be able to tolerate med fine. Dosing change will be based on A1c result.

## 2016-02-17 NOTE — Assessment & Plan Note (Addendum)
Complicated by ongoing bereavement. Continue current regimen of celexa, trazodone, ativan prn.

## 2016-02-17 NOTE — Assessment & Plan Note (Signed)
Exam not consistent with hip arthritis etiology. Anticipate lumbar radiculopathy with mild trochanteric bursitis on right. Treat with scheduled tylenol 500mg  TID, update lumbar films. Pt agrees with plan.

## 2016-02-17 NOTE — Assessment & Plan Note (Addendum)
Anticipate possible thrush - treat with nystatin swish/swallow. Reassess at 3 wk f/u. If persistent lesions, consider ENT referral for further evaluation.  No significant EtOH use. Ex smoker.

## 2016-02-17 NOTE — Assessment & Plan Note (Addendum)
bp elevated today - reviewed recent stable renal and aortic angiograms. Compliant with current regimen. Anticipate pain playing part in elevated blood pressure so no changes made today. Consider increased metoprolol (h/o pedal edema rxn to amlodipine). Recheck at 3wk f/u.

## 2016-02-17 NOTE — Patient Instructions (Addendum)
I think this is from lower back arthritis causing pain and possible pinching of nerve as it comes out of spine. Lab work today to check A1c. Xray today. Schedule tylenol 500mg  three times daily with meals. You have white spots in back of throat - for possible thrush, treat with nystatin swish and swallow solution.  Return in 3 weeks for follow up.

## 2016-02-17 NOTE — Assessment & Plan Note (Signed)
Stable on renal/aortic angiogram 02/2016.

## 2016-02-20 ENCOUNTER — Encounter: Payer: Self-pay | Admitting: Family Medicine

## 2016-04-07 ENCOUNTER — Other Ambulatory Visit: Payer: Self-pay | Admitting: *Deleted

## 2016-04-07 MED ORDER — METFORMIN HCL 1000 MG PO TABS: 1000.0000 mg | ORAL_TABLET | Freq: Two times a day (BID) | ORAL | Status: DC

## 2016-04-14 ENCOUNTER — Encounter: Payer: Self-pay | Admitting: Family Medicine

## 2016-04-14 ENCOUNTER — Ambulatory Visit (INDEPENDENT_AMBULATORY_CARE_PROVIDER_SITE_OTHER): Payer: Medicare Other | Admitting: Family Medicine

## 2016-04-14 VITALS — BP 184/100 | HR 64 | Temp 97.6°F | Ht 73.0 in | Wt 192.0 lb

## 2016-04-14 DIAGNOSIS — M5417 Radiculopathy, lumbosacral region: Secondary | ICD-10-CM | POA: Diagnosis not present

## 2016-04-14 DIAGNOSIS — M5416 Radiculopathy, lumbar region: Secondary | ICD-10-CM

## 2016-04-14 DIAGNOSIS — I701 Atherosclerosis of renal artery: Secondary | ICD-10-CM | POA: Diagnosis not present

## 2016-04-14 MED ORDER — GABAPENTIN 300 MG PO CAPS: 300.0000 mg | ORAL_CAPSULE | Freq: Every day | ORAL | Status: DC

## 2016-04-14 MED ORDER — ACETAMINOPHEN-CODEINE #3 300-30 MG PO TABS: ORAL_TABLET | ORAL | Status: DC

## 2016-04-14 NOTE — Progress Notes (Signed)
Pre visit review using our clinic review tool, if applicable. No additional management support is needed unless otherwise documented below in the visit note. 

## 2016-04-14 NOTE — Progress Notes (Signed)
Dr. Frederico Hamman T. Kalenna Millett, MD, [facility] [place], [address] Phone: [phone] Fax: [phone]  04/14/2016  Patient: Charles [name], MRN: [id number], DOB: 1932/01/15, 82 y.o.  Primary Physician:  [name], MD   Chief Complaint  Patient presents with  . Hip Pain    Right   Subjective:   Charles [name] is a 82 y.o. very pleasant male patient who presents with the following:  The patient presents for evaluation of right-sided hip pain.  At the time of his office visit, he actually is in a hypertensive crisis with blood pressure 211/121.  It appears that the patient has renal artery stenosis.  He only took his blood pressure medicine about 45 minutes prior to coming here.  Upon review of the record it does look as if his blood pressure has been quite high for some time.  After putting him sit and relax for 20 minutes his blood pressure did decrease to at least 184/100.  Hip pain. He thinks that his pain is coming from his hip, and he is somewhat worried about it.  He also has very significant degenerative changes in his lumbar spine seen on his relatively recent x-rays.  He also has a very significant medical history including coronary disease, renal artery stenosis, out of control hypertension, atrial flutter, aortic stenosis.  HTN urgency. Took all 3 blood pressure meds about 1 hour ago.   BP Readings from Last 3 Encounters:  04/14/16 211/121  02/17/16 170/88  02/02/16 165/91    When getting up in the morning, will get up and get something to drink and have a hiscuit. There are times when it will not bother him at all. Sometimes it will hurt in his back. Sometime radic to the knee on the R.  184/100.    Past Medical History, Surgical History, Social History, Family History, Problem List, Medications, and Allergies have been reviewed and updated if relevant.  Patient Active Problem List   Diagnosis Date  Noted  . Lumbar back pain with radiculopathy affecting right lower extremity 02/17/2016  . Mouth lesion 02/17/2016  . Atrial flutter ([place]) 06/11/2015  . Orthopnea 06/06/2015  . Advanced care planning/counseling discussion 10/08/2014  . Health maintenance examination 10/08/2014  . Aortic stenosis 06/04/2014  . Other fatigue 04/02/2014  . DDD (degenerative disc disease), lumbar 10/02/2013  . Medicare annual wellness visit, subsequent 10/02/2013  . Ex-smoker   . Renal artery stenosis ([place]) 08/23/2013  . MDD (major depressive disorder), recurrent episode, moderate ([place]) 09/21/2007  . OSTEOARTHRITIS 09/21/2007  . COLONIC POLYPS 01/31/2007  . Diabetes mellitus type 2, uncontrolled, with complications ([place]) 99991111  . HYPERCHOLESTEROLEMIA 01/31/2007  . Anxiety state 01/31/2007  . ERECTILE DYSFUNCTION 01/31/2007  . Essential hypertension 01/31/2007  . Coronary atherosclerosis 01/31/2007  . ATHEROSCLEROTIC CARDIOVASCULAR DISEASE 01/31/2007  . ALLERGIC RHINITIS 01/31/2007  . GERD 01/31/2007  . PSORIASIS 01/31/2007    Past Medical History  Diagnosis Date  . History of chicken pox   . GERD (gastroesophageal reflux disease) 2003    h/o duodenal ulcer per EGD as well as esophagitis  . Seasonal allergies   . CAD (coronary artery disease) 2004    s/p 4v CABG (Dr. [name])  . HTN (hypertension)   . HLD (hyperlipidemia)   . Colon polyp 2005    (Dr. Tiffany Kocher)  . Urge incontinence of urine   . Psoriasis   . COPD (chronic obstructive pulmonary disease) ([place]) 03/2011  by xray  . Carotid stenosis 1999    s/p L CEA  . History of colon polyps 2003, 2005    adenomatous Vira Agar)  . Ex-smoker   . Compression fracture of L1 lumbar vertebra (HCC) remote  . 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 Aortic stenosis 06/2014    moderate-severe  . CHF (congestive heart failure) ([place]) dx'd 06/2015  . T2DM (type 2 diabetes mellitus) ([place]) 1995  . Renal artery stenosis  ([place]) 2004    70% bilateral, followed by cards  . Arthritis     in lower back (06/24/2015)  . DDD (degenerative disc disease), lumbar 2014    severe L2/3 L5/S1 by xray  . Depression     hx  . Atrial flutter ([place])     notes 06/24/2015  . Abdominal aortic atherosclerosis ([place])     by xray  . Compression fracture of L1 lumbar vertebra (HCC) 02/2016    chronic by xray    Past Surgical History  Procedure Laterality Date  . Tonsillectomy    . Carotid endarterectomy Left 1999    ([facility]ank[facility])  . Coronary artery bypass graft  03/07/2003    4v CABG ([place]) with LIMA to LAD, vein graft to RCA, 1st obtuse marginal, and ramus intermedius  . Colonoscopy  2003    colon polyp x3 - adenomatous Tiffany Kocher)  . Cardiovascular stress test  11/2010    normal perfusion, no evidence of ischemia, EF 62% post exercise  . Upper gastrointestinal endoscopy  2003    reflux esophagitis, erosive gastropathy, duodenal ulcer  . Cardiovascular stress test  11/28/2012    Mild diaphragmatic attenuation; cannot exclude a focal region of nontransmural inferior scar  . Cardiac catheterization  03/06/2003    No intervention - recommend CABG  . Renal doppler  11/29/2011    Celiac&SMA-demonstrated vessel narrowing suggestive of a greater than 50% diameter reduction. Bilateral renal arteries-demonstrated vessel narrowing of 60-99% diameter reduction. Rt Kidney-mid pole lateral simple cyst noted measuring 1.29x0.76x1.11cm and exophytic cyst outside lower pole measuring 1.23x0.96x1.31. Lft Kidney-lateral mid to lower pole simple cyst measuering-1.24x9.83x1.24  . Cataract extraction w/ intraocular lens  implant, bilateral Bilateral 01/2013    [place]  . Colonoscopy  10/08/2012    2 TA, diverticulosis, int hem, no rpt rec Tiffany Kocher)  . Esophagogastroduodenoscopy  10/08/2012    nl esophagus, duodenitis and erosive gastropathy, path - gastropathy no Hpylori, no rpt rec  . Coronary angioplasty    . Cardioversion N/A 06/25/2015    Procedure:  CARDIOVERSION;  Surgeon: Pixie Casino, MD;  Location: [facility] ENDOSCOPY;  Service: Cardiovascular;  Laterality: N/A;  . Tee without cardioversion N/A 06/25/2015    Procedure: TRANSESOPHAGEAL ECHOCARDIOGRAM (TEE);  Surgeon: Pixie Casino, MD;  Location: [facility];  Service: Cardiovascular;  Laterality: N/A;  . Peripheral vascular catheterization N/A 02/02/2016    Procedure: Renal Angiography;  Surgeon: [name], MD;  Location: [facility] CV LAB;  Service: Cardiovascular;  Laterality: N/A;  . Peripheral vascular catheterization N/A 02/02/2016    Procedure: Abdominal Aortogram;  Surgeon: [name], MD;  Location: [facility] CV LAB;  Service: Cardiovascular;  Laterality: N/A;    Social History   Social History  . Marital Status: Married    Spouse Name: N/A  . Number of Children: N/A  . Years of Education: N/A   Occupational History  . Not on file.   Social History Main Topics  . Smoking status: Former Smoker -- 0.50 packs/day  for 36 years    Types: Cigarettes    Quit date: 07/23/2013  . Smokeless tobacco: Never Used  . Alcohol Use: No  . Drug Use: No  . Sexual Activity: Not Currently   Other Topics Concern  . Not on file   Social History Narrative   Caffeine: 4 cups coffee/day, some tea and soda   Lives with wife   Occupation: Retired   Activity: no regular exercise   Diet: good water, fruits/vegetables daily    Family History  Problem Relation Age of Onset  . Hypertension Father   . Heart disease Father   . Cancer Mother     GI cancer  . Diabetes Sister   . Heart disease Sister   . Stroke Sister   . Hypertension Sister   . Cancer Brother     Lung cancer - nonsmoker  . Cancer Maternal Grandmother   . Heart attack Maternal Grandfather   . Heart attack Paternal Grandmother   . Heart attack Paternal Grandfather   . Cancer Brother     Pancreatic cancer  . Lung disease Brother     Allergies  Allergen Reactions  . Amlodipine Other (See Comments)     Edema  . Other Other (See Comments)    myalgias Horse serum Horse serum  . Rosiglitazone Other (See Comments)    REACTION: Did not help  . Rosiglitazone Maleate     REACTION: Did not help  . Tricor [Fenofibrate] Other (See Comments)    myalgias    Medication list reviewed and updated in full in [facility].  GEN: No fevers, chills. Nontoxic. Primarily MSK c/o today. MSK: Detailed in the HPI GI: tolerating PO intake without difficulty Neuro: No numbness, parasthesias, or tingling associated. Otherwise the pertinent positives of the ROS are noted above.   Objective:   Filed Vitals:   04/14/16 1218 04/14/16 1239  BP: 211/121 184/100  Pulse: 64   Temp: 97.6 F (36.4 C)   TempSrc: Oral   Height: 6\' 1"  (1.854 m)   Weight: 192 lb (87.091 kg)      GEN: WDWN, NAD, Non-toxic, Alert & Oriented x 3 HEENT: Atraumatic, Normocephalic.  Ears and Nose: No external deformity. EXTR: No clubbing/cyanosis/edema NEURO: Normal gait.  PSYCH: Normally interactive. Conversant. Not depressed or anxious appearing.  Calm demeanor.   HIP EXAM: SIDE: B ROM: Abduction, Flexion, Internal and External range of motion: minor restriction only Pain with terminal IROM and EROM: none GTB: NT SLR: NEG Knees: No effusion FABER: NT REVERSE FABER: NT, neg Piriformis: NT at direct palpation Str: flexion: 5/5 abduction: 5/5 adduction: 5/5 Strength testing non-tender  Forward flexion and extension, lateral rotation and bending are preserved. Modest tenderness on the posterior pelvic musculature and in the piriformis region. Minor tenderness from L2-S1 bilaterally. Neurovascularly intact.  Radiology: CLINICAL DATA: Low back pain with right radiculopathy  EXAM: LUMBAR SPINE - COMPLETE 4+ VIEW  COMPARISON: 10/02/2013  FINDINGS: There are 5 nonrib bearing lumbar-type vertebral bodies.  There is a chronic L1 vertebral body compression fracture.  The alignment is anatomic. There is  no spondylolysis. There is no static listhesis.  There is no acute fracture.  There is degenerative disc disease throughout the lumbar spine most severe at L2-3 and L5-S1. There is bilateral facet arthropathy at L5-S1.  The SI joints are unremarkable.  There is abdominal aortic atherosclerosis.  IMPRESSION: 1. No acute osseous injury of the lumbar spine. 2. Lumbar spine spondylosis as described above. 3. Chronic L1  vertebral body compression fracture.   Electronically Signed  By: Kathreen Devoid  On: 02/17/2016 13:09  The radiological images were independently reviewed by myself in the office and results were reviewed with the patient. My independent interpretation of images:  Agree with the above interpretation.  Additionally, on the AP view, bilateral AP hip view is seen and there is bilateral mild osteoarthritic changes apparent in the intra-articular hip space. Electronically Signed  By: Owens Loffler, MD On: 04/16/2016 9:01 AM   Assessment and Plan:   Lumbar back pain with radiculopathy affecting right lower extremity  Renal artery stenosis (HCC)  No significant intra-articular hip disease.  Origin is all from lumbar spine.  Diffuse degenerative changes throughout his lumbar spine.  Likely he is getting some nerve encroachment on the right causing some radiculopathy.  We will do a trial of some gabapentin to see if this helps, initially starting at a low dose at nighttime.  This could easily be titrated to effect.  Unable to use tramadol secondary to QTc.  Prior EKGs reviewed.  Interactions with Celexa, potential risk outweighs benefit.  We will do a trial of some Tylenol 3 if needed for pain.  Overall prognosis is fair.  With regards to his RAS / HTN notably elevated blood pressures have been essentially his norm.  I'm going to alert his PCP and cardiology has also been involved.   New Prescriptions   ACETAMINOPHEN-CODEINE (TYLENOL #3) 300-30 MG TABLET    1/2  to 1 tab po every 6 hours   GABAPENTIN (NEURONTIN) 300 MG CAPSULE    Take 1 capsule (300 mg total) by mouth at bedtime.   Signed,  Maud Deed. Hawkins Seaman, MD   Patient's Medications  New Prescriptions   ACETAMINOPHEN-CODEINE (TYLENOL #3) 300-30 MG TABLET    1/2 to 1 tab po every 6 hours   GABAPENTIN (NEURONTIN) 300 MG CAPSULE    Take 1 capsule (300 mg total) by mouth at bedtime.  Previous Medications   ACETAMINOPHEN (TYLENOL) 500 MG TABLET    Take 1 tablet (500 mg total) by mouth 3 (three) times daily.   ATORVASTATIN (LIPITOR) 20 MG TABLET    Take 20 mg by mouth daily at 6 PM.   BAYER MICROLET LANCETS LANCETS    Ck blood sugar three times daily and as directed. Dx 250.00. Insulin dependent   CITALOPRAM (CELEXA) 20 MG TABLET    Take 1 tablet (20 mg total) by mouth daily.   CYANOCOBALAMIN (B-12) 500 MCG TABS    Take 1 tablet by mouth daily.   FUROSEMIDE (LASIX) 40 MG TABLET    Take 1 tablet (40 mg total) by mouth daily.   GLUCOSE BLOOD (BAYER CONTOUR NEXT TEST) TEST STRIP    Ck blood sugar three times daily and as directed. Dx 250.00 insulin dependent   INSULIN GLARGINE (LANTUS) 100 UNIT/ML INJECTION    Inject 0.32 mLs (32 Units total) into the skin at bedtime.   INSULIN SYRINGE-NEEDLE U-100 (B-D INS SYR ULTRAFINE 1CC/30G) 30G X 1/2" 1 ML MISC    1 Syringe by Does not apply route as directed.   LISINOPRIL (PRINIVIL,ZESTRIL) 40 MG TABLET    Take 1 tablet (40 mg total) by mouth daily.   LORAZEPAM (ATIVAN) 0.5 MG TABLET    Take 0.5-1 tablets (0.25-0.5 mg total) by mouth 2 (two) times daily as needed for anxiety.   METFORMIN (GLUCOPHAGE) 1000 MG TABLET    Take 1 tablet (1,000 mg total) by mouth 2 (two) times daily with a  meal.   METOPROLOL TARTRATE (LOPRESSOR) 25 MG TABLET    Take 1 tablet (25 mg total) by mouth 2 (two) times daily.   NYSTATIN (MYCOSTATIN) 100000 UNIT/ML SUSPENSION    Take 5 mLs (500,000 Units total) by mouth 3 (three) times daily.   PANTOPRAZOLE (PROTONIX) 20 MG TABLET    Take 1  tablet (20 mg total) by mouth daily.   POTASSIUM CHLORIDE SA (K-DUR,KLOR-CON) 20 MEQ TABLET    Take 20 mEq by mouth daily.    SAW PALMETTO 160 MG CAPSULE    Take 450 mg by mouth daily.    TRAZODONE (DESYREL) 50 MG TABLET    Take 2 tablets (100 mg total) by mouth at bedtime.   VITAMIN C (ASCORBIC ACID) 500 MG TABLET    Take 500 mg by mouth daily.  Modified Medications   No medications on file  Discontinued Medications   No medications on file

## 2016-04-17 ENCOUNTER — Telehealth: Payer: Self-pay | Admitting: Family Medicine

## 2016-04-17 NOTE — Telephone Encounter (Signed)
plz notify patient - I reviewed office visit from this week with Dr Lorelei Pont with very high blood pressures. Recommend he f/u with myself or Dr [name] to discuss further treatment of blood pressure.

## 2016-04-19 NOTE — Telephone Encounter (Signed)
Message left for patient to return my call.  

## 2016-04-21 NOTE — Telephone Encounter (Signed)
Message left for patient to return my call.  

## 2016-04-23 ENCOUNTER — Encounter: Payer: Self-pay | Admitting: *Deleted

## 2016-04-23 NOTE — Telephone Encounter (Signed)
Message left advising patient to call and schedule appt. Letter mailed as well.

## 2016-04-27 ENCOUNTER — Other Ambulatory Visit: Payer: Self-pay | Admitting: Family Medicine

## 2016-05-03 ENCOUNTER — Encounter: Payer: Self-pay | Admitting: Family Medicine

## 2016-05-03 ENCOUNTER — Ambulatory Visit (INDEPENDENT_AMBULATORY_CARE_PROVIDER_SITE_OTHER): Payer: Medicare Other | Admitting: Family Medicine

## 2016-05-03 VITALS — BP 190/100 | HR 68 | Temp 98.0°F | Wt 192.5 lb

## 2016-05-03 DIAGNOSIS — I10 Essential (primary) hypertension: Secondary | ICD-10-CM | POA: Diagnosis not present

## 2016-05-03 DIAGNOSIS — I701 Atherosclerosis of renal artery: Secondary | ICD-10-CM

## 2016-05-03 DIAGNOSIS — I350 Nonrheumatic aortic (valve) stenosis: Secondary | ICD-10-CM | POA: Diagnosis not present

## 2016-05-03 DIAGNOSIS — M5416 Radiculopathy, lumbar region: Secondary | ICD-10-CM

## 2016-05-03 DIAGNOSIS — M5417 Radiculopathy, lumbosacral region: Secondary | ICD-10-CM | POA: Diagnosis not present

## 2016-05-03 MED ORDER — METOPROLOL TARTRATE 50 MG PO TABS: 50.0000 mg | ORAL_TABLET | Freq: Two times a day (BID) | ORAL | 6 refills | Status: DC

## 2016-05-03 MED ORDER — ACETAMINOPHEN-CODEINE #3 300-30 MG PO TABS: ORAL_TABLET | ORAL | 2 refills | Status: DC

## 2016-05-03 MED ORDER — GABAPENTIN 100 MG PO CAPS: 100.0000 mg | ORAL_CAPSULE | Freq: Two times a day (BID) | ORAL | 3 refills | Status: DC | PRN

## 2016-05-03 NOTE — Progress Notes (Signed)
Pre visit review using our clinic review tool, if applicable. No additional management support is needed unless otherwise documented below in the visit note. 

## 2016-05-03 NOTE — Assessment & Plan Note (Signed)
Hypertension with urgency.  Continue lasix 40mg  daily, lisinopril 40mg  daily, increase metoprolol to 50mg  BID. New dose sent to pharmacy. Update [place] with effect in 2 wks, f/u with Dr [name] in 1 month.

## 2016-05-03 NOTE — Assessment & Plan Note (Signed)
Recent angiogram reassuring (02/2016). Suggested f/u with cardiology.

## 2016-05-03 NOTE — Patient Instructions (Addendum)
Increase metoprolol to 50mg  twice daily. Continue current lasix and lisinopril doses. Keep log of blood pressures and heart rate, update me with effect in 2 weeks.  Schedule follow up with Dr [name] in 1 month. Restart aspirin 81mg  daily. Continue tylenol #3 for lumbar back pain. Try lower gabapentin 100mg  twice daily as needed for back pain. Let me know how this helps in 2 weeks.

## 2016-05-03 NOTE — Progress Notes (Signed)
BP (!) 190/100 (BP Location: Right Arm, Cuff Size: Normal)   Pulse 68   Temp 98 F (36.7 C) (Oral)   Wt 192 lb 8 oz (87.3 kg)   BMI 25.40 kg/m    CC: HTN f/u visit Subjective:    Patient ID: Charles [name], male    DOB: 1932/09/01, 82 y.o.   MRN: [id number]  HPI: Charles [name] is a 82 y.o. male presenting on 05/03/2016 for Follow-up   See prior note for details. Briefly, seen by Dr Lorelei Pont 04/14/2016 with R hip pain and in hypertensive crisis with BP 211/121. R hip pain thought all lumbar spine degenerative disease related - started on gabapentin and tylenol #3. Gabapentin caused leg weakness. Tylenol #3 has helped some. Most trouble with prolonged standing >5 min. Denies fevers, leg numbness or weakness, bowel/bladder accidents or saddle anesthesia.   Antihypertensive regimen includes lasix 40mg  daily, lisinopril 40mg  daily, metoprolol 25mg  bid. BP has been elevated over last several months. Endorses good effect of lasix 40mg  daily.   States home readings running 170-180/90s.  He tries to avoid salt, drinking plenty of water. Drinks 1 gallon of whole milk per day.  Denies HA, vision changes, CP/tightness, SOB, leg swelling.   Relevant past medical, surgical, family and social history reviewed and updated as indicated. Interim medical history since our last visit reviewed. Allergies and medications reviewed and updated. Current Outpatient Prescriptions on File Prior to Visit  Medication Sig  . acetaminophen (TYLENOL) 500 MG tablet Take 1 tablet (500 mg total) by mouth 3 (three) times daily.  [name] atorvastatin (LIPITOR) 20 MG tablet TAKE 1 TABLET DAILY.  [name] BAYER MICROLET LANCETS lancets Ck blood sugar three times daily and as directed. Dx 250.00. Insulin dependent  . citalopram (CELEXA) 20 MG tablet Take 1 tablet (20 mg total) by mouth daily.  . Cyanocobalamin (B-12) 500 MCG TABS Take 1 tablet by mouth daily.  . furosemide (LASIX) 40 MG tablet Take 1 tablet (40 mg total) by mouth  daily.  [name] glucose blood (BAYER CONTOUR NEXT TEST) test strip Ck blood sugar three times daily and as directed. Dx 250.00 insulin dependent  . insulin glargine (LANTUS) 100 UNIT/ML injection Inject 0.32 mLs (32 Units total) into the skin at bedtime.  . Insulin Syringe-Needle U-100 (B-D INS SYR ULTRAFINE 1CC/30G) 30G X 1/2" 1 ML MISC 1 Syringe by Does not apply route as directed.  [name] lisinopril (PRINIVIL,ZESTRIL) 40 MG tablet Take 1 tablet (40 mg total) by mouth daily.  [name] LORazepam (ATIVAN) 0.5 MG tablet Take 0.5-1 tablets (0.25-0.5 mg total) by mouth 2 (two) times daily as needed for anxiety.  . metFORMIN (GLUCOPHAGE) 1000 MG tablet Take 1 tablet (1,000 mg total) by mouth 2 (two) times daily with a meal.  . nystatin (MYCOSTATIN) 100000 UNIT/ML suspension Take 5 mLs (500,000 Units total) by mouth 3 (three) times daily.  . pantoprazole (PROTONIX) 20 MG tablet Take 1 tablet (20 mg total) by mouth daily.  . potassium chloride SA (K-DUR,KLOR-CON) 20 MEQ tablet Take 20 mEq by mouth daily.   . saw palmetto 160 MG capsule Take 450 mg by mouth daily.   . traZODone (DESYREL) 50 MG tablet Take 2 tablets (100 mg total) by mouth at bedtime.  . vitamin C (ASCORBIC ACID) 500 MG tablet Take 500 mg by mouth daily.   No current facility-administered medications on file prior to visit.     Review of Systems Per HPI unless specifically indicated in ROS section  Objective:    BP (!) 190/100 (BP Location: Right Arm, Cuff Size: Normal)   Pulse 68   Temp 98 F (36.7 C) (Oral)   Wt 192 lb 8 oz (87.3 kg)   BMI 25.40 kg/m   Wt Readings from Last 3 Encounters:  05/03/16 192 lb 8 oz (87.3 kg)  04/14/16 192 lb (87.1 kg)  02/17/16 184 lb 8 oz (83.7 kg)    Physical Exam  Constitutional: He appears well-developed and well-nourished. No distress.  HENT:  Mouth/Throat: Oropharynx is clear and moist. No oropharyngeal exudate.  Cardiovascular: Normal rate, regular rhythm and intact distal pulses.   Murmur (4/6  SEM LUSB) heard. Pulmonary/Chest: Effort normal and breath sounds normal. No respiratory distress. He has no wheezes. He has no rales.  Musculoskeletal: He exhibits no edema.  No pain midline spine No paraspinous mm tenderness Neg SLR bilaterally. No pain with int/ext rotation at hip. Tender at R SIJ No pain at L SIJ, or GTB or sciatic notch bilaterally.   Neurological:  5/5 strength BLE  Skin: Skin is warm and dry.  Psychiatric: He has a normal mood and affect.  Nursing note and vitals reviewed.  Results for orders placed or performed in visit on 02/17/16  Hemoglobin A1c  Result Value Ref Range   Hgb A1c MFr Bld 8.2 (H) 4.6 - 6.5 %      Assessment & Plan:   Problem List Items Addressed This Visit    Aortic stenosis    Will titrate antihypertensives carefully in setting of moderate to severe aortic stenosis.       Relevant Medications   aspirin EC 81 MG tablet   metoprolol tartrate (LOPRESSOR) 50 MG tablet   Essential hypertension - Primary    Hypertension with urgency.  Continue lasix 40mg  daily, lisinopril 40mg  daily, increase metoprolol to 50mg  BID. New dose sent to pharmacy. Update [place] with effect in 2 wks, f/u with Dr [name] in 1 month.       Relevant Medications   aspirin EC 81 MG tablet   metoprolol tartrate (LOPRESSOR) 50 MG tablet   Lumbar back pain with radiculopathy affecting right lower extremity    Presumed lumbar radiculopathy that is deteriorating despite tylenol #3 and gabapentin. Agrees to trial lower gabapentin dose 100mg  (300mg  was overly sedating). Will continue tylenol #3.  Update with effect in 2-4 weeks, low threshold for MR lumbar for further evaluation as patient would consider ESI if needed.       Relevant Medications   aspirin EC 81 MG tablet   acetaminophen-codeine (TYLENOL #3) 300-30 MG tablet   gabapentin (NEURONTIN) 100 MG capsule   Renal artery stenosis (HCC)    Recent angiogram reassuring (02/2016). Suggested f/u with cardiology.       Relevant Medications   aspirin EC 81 MG tablet   metoprolol tartrate (LOPRESSOR) 50 MG tablet    Other Visit Diagnoses   None.      Follow up plan: Return in about 3 months (around 08/03/2016).  [name], MD

## 2016-05-03 NOTE — Assessment & Plan Note (Signed)
Will titrate antihypertensives carefully in setting of moderate to severe aortic stenosis.

## 2016-05-04 NOTE — Assessment & Plan Note (Signed)
Presumed lumbar radiculopathy that is deteriorating despite tylenol #3 and gabapentin. Agrees to trial lower gabapentin dose 100mg  (300mg  was overly sedating). Will continue tylenol #3.  Update with effect in 2-4 weeks, low threshold for MR lumbar for further evaluation as patient would consider ESI if needed.

## 2016-05-27 ENCOUNTER — Telehealth: Payer: Self-pay | Admitting: Family Medicine

## 2016-05-27 NOTE — Telephone Encounter (Signed)
Scheduled 8/28-Lesia Scheduled 9/8-Dr. [name]

## 2016-05-27 NOTE — Telephone Encounter (Signed)
LVM on home phone asking pt to call back.  Pt due for AWV per L[facility] [place] email 8/23. Try and schedule with Lesia before 07/03/16

## 2016-05-31 ENCOUNTER — Other Ambulatory Visit: Payer: Self-pay | Admitting: Family Medicine

## 2016-05-31 ENCOUNTER — Ambulatory Visit: Payer: Medicare Other

## 2016-05-31 DIAGNOSIS — E7800 Pure hypercholesterolemia, unspecified: Secondary | ICD-10-CM

## 2016-05-31 DIAGNOSIS — E1165 Type 2 diabetes mellitus with hyperglycemia: Secondary | ICD-10-CM

## 2016-05-31 DIAGNOSIS — IMO0002 Reserved for concepts with insufficient information to code with codable children: Secondary | ICD-10-CM

## 2016-05-31 DIAGNOSIS — Z794 Long term (current) use of insulin: Principal | ICD-10-CM

## 2016-05-31 DIAGNOSIS — E118 Type 2 diabetes mellitus with unspecified complications: Principal | ICD-10-CM

## 2016-06-02 ENCOUNTER — Ambulatory Visit (INDEPENDENT_AMBULATORY_CARE_PROVIDER_SITE_OTHER): Payer: Medicare Other | Admitting: Cardiovascular Disease

## 2016-06-02 ENCOUNTER — Encounter: Payer: Self-pay | Admitting: Cardiovascular Disease

## 2016-06-02 VITALS — BP 124/82 | HR 44 | Ht 73.0 in | Wt 195.6 lb

## 2016-06-02 DIAGNOSIS — I10 Essential (primary) hypertension: Secondary | ICD-10-CM

## 2016-06-02 DIAGNOSIS — E7800 Pure hypercholesterolemia, unspecified: Secondary | ICD-10-CM | POA: Diagnosis not present

## 2016-06-02 DIAGNOSIS — I350 Nonrheumatic aortic (valve) stenosis: Secondary | ICD-10-CM

## 2016-06-02 DIAGNOSIS — I483 Typical atrial flutter: Secondary | ICD-10-CM | POA: Diagnosis not present

## 2016-06-02 DIAGNOSIS — I701 Atherosclerosis of renal artery: Secondary | ICD-10-CM

## 2016-06-02 NOTE — Assessment & Plan Note (Signed)
History of hyperlipidemia on statin therapy followed by his PCP 

## 2016-06-02 NOTE — Assessment & Plan Note (Signed)
History of paroxysmal atrial flutter status post recent TEE DC cardioversion performed by Dr. Debara Pickett 06/25/15 successfully to sinus rhythm which he remains in. He was initially placed on Eliquis was [place] coagulation but ultimately decided to stop this for various reasons

## 2016-06-02 NOTE — Assessment & Plan Note (Signed)
History of renal artery stenosis demonstrated angiographically back in 2004 with recent Dopplers which suggested progression of disease with difficult to control hypertension. He did undergo angiography by myself 02/02/16 revealing at most 30-40% left and 50 a 60% right renal artery stenosis. I believe the Dopplers of the degree of stenosis and felt that medical therapy was warranted. Blood pressures have been under better control.

## 2016-06-02 NOTE — Patient Instructions (Signed)
Medication Instructions:  Your physician recommends that you continue on your current medications as directed. Please refer to the Current Medication list given to you today.   Testing/Procedures: Your physician has requested that you have an echocardiogram. Echocardiography is a painless test that uses sound waves to create images of your heart. It provides your doctor with information about the size and shape of your heart and how well your heart's chambers and valves are working. This procedure takes approximately one hour. There are no restrictions for this procedure.  IN April 2018.   Follow-Up: We request that you follow-up in: 6 MONTHS with an extender and in 12 MONTHS with Dr Andria Rhein will receive a reminder letter in the mail two months in advance. If you don't receive a letter, please call our office to schedule the follow-up appointment.    Any Other Special Instructions Will Be Listed Below (If Applicable).     If you need a refill on your cardiac medications before your next appointment, please call your pharmacy.

## 2016-06-02 NOTE — Assessment & Plan Note (Signed)
History of hypertension blood pressure measured at 124/82. He is on lisinopril and metoprolol. Continue current meds at current dosing

## 2016-06-02 NOTE — Progress Notes (Signed)
06/02/2016 Charles [name]   1932/07/02  [id number]  Primary Physician [name], MD Primary Cardiologist: [name] MD [name]  HPI:  The patient is a delightful, 82 year old, mildly overweight, married Caucasian male whose wife was also a patient of mine. She unfortunately passed away 07/30/2015. He currently lives with one of his sons and appears to be fairly depressed. He was married for 51 years.He is a father of 72, grandfather to 4 grandchildren. I last saw him 01/27/16. He is accompanied by his son [name] today.[name] He has a history of CAD status post coronary artery bypass grafting x4 June 2004 with a LIMA to his LAD; a vein to the right coronary artery, obtuse marginal branch and ramus branch. He also was found to have 70% bilateral renal artery stenosis which we have been following by duplex ultrasound. He has had left carotid endarterectomy which we follow as well. His other problems include hypertension, hyperlipidemia and noninsulin-requiring diabetes. He is asymptomatic. His last functional study performed 2/14 was nonischemic. he denies chest pain or shortness of breath. He had new onset atrial flutter back in the fall and underwent TEE guided DC cardioversion by Dr. Debara Pickett 06/25/15 successfully to sinus rhythm. He was placed on Eliquis oral anticoagulation but has ultimately decided not to take this for various reasons.recent renal Dopplers have suggested progression of disease bilaterally left greater than right. His blood pressures have been more difficult to control as well. In addition, his echo performed 06/13/15 was significant for moderate to severe aortic stenosis with a valve area of 0.9 cm2 and a peak gradient of 49 mmHg. since I saw him in the office 4 months ago he remained currently stable. He denies chest pain or shortness of breath.   Current Outpatient Prescriptions  Medication Sig Dispense Refill  . acetaminophen (TYLENOL) 500 MG tablet Take 1  tablet (500 mg total) by mouth 3 (three) times daily.    [name] acetaminophen-codeine (TYLENOL #3) 300-30 MG tablet 1/2 to 1 tab po every 6 hours 40 tablet 2  . aspirin EC 81 MG tablet Take 81 mg by mouth daily.    [name] atorvastatin (LIPITOR) 20 MG tablet TAKE 1 TABLET DAILY. 90 tablet 0  . BAYER MICROLET LANCETS lancets Ck blood sugar three times daily and as directed. Dx 250.00. Insulin dependent 100 each 11  . citalopram (CELEXA) 20 MG tablet Take 1 tablet (20 mg total) by mouth daily. 30 tablet 6  . Cyanocobalamin (B-12) 500 MCG TABS Take 1 tablet by mouth daily.    . furosemide (LASIX) 40 MG tablet Take 1 tablet (40 mg total) by mouth daily. 30 tablet 3  . gabapentin (NEURONTIN) 100 MG capsule Take 1 capsule (100 mg total) by mouth 2 (two) times daily as needed. 60 capsule 3  . glucose blood (BAYER CONTOUR NEXT TEST) test strip Ck blood sugar three times daily and as directed. Dx 250.00 insulin dependent 100 each 11  . insulin glargine (LANTUS) 100 UNIT/ML injection Inject 0.32 mLs (32 Units total) into the skin at bedtime.    . Insulin Syringe-Needle U-100 (B-D INS SYR ULTRAFINE 1CC/30G) 30G X 1/2" 1 ML MISC 1 Syringe by Does not apply route as directed. 100 each 3  . lisinopril (PRINIVIL,ZESTRIL) 40 MG tablet Take 1 tablet (40 mg total) by mouth daily. 90 tablet 3  . LORazepam (ATIVAN) 0.5 MG tablet Take 0.5-1 tablets (0.25-0.5 mg total) by mouth 2 (two) times daily as needed for anxiety.  30 tablet 0  . metFORMIN (GLUCOPHAGE) 1000 MG tablet Take 1 tablet (1,000 mg total) by mouth 2 (two) times daily with a meal. 60 tablet 3  . metoprolol tartrate (LOPRESSOR) 50 MG tablet Take 1 tablet (50 mg total) by mouth 2 (two) times daily. 60 tablet 6  . nystatin (MYCOSTATIN) 100000 UNIT/ML suspension Take 5 mLs (500,000 Units total) by mouth 3 (three) times daily. 120 mL 0  . pantoprazole (PROTONIX) 20 MG tablet Take 1 tablet (20 mg total) by mouth daily. 90 tablet 0  . potassium chloride SA (K-DUR,KLOR-CON)  20 MEQ tablet Take 20 mEq by mouth daily.     . saw palmetto 160 MG capsule Take 450 mg by mouth daily.     . traZODone (DESYREL) 50 MG tablet Take 2 tablets (100 mg total) by mouth at bedtime. 180 tablet 3  . vitamin C (ASCORBIC ACID) 500 MG tablet Take 500 mg by mouth daily.     No current facility-administered medications for this visit.     Allergies  Allergen Reactions  . Amlodipine Other (See Comments)    Edema  . Other Other (See Comments)    myalgias Horse serum Horse serum  . Rosiglitazone Other (See Comments)    REACTION: Did not help  . Rosiglitazone Maleate     REACTION: Did not help  . Tricor [Fenofibrate] Other (See Comments)    myalgias    Social History   Social History  . Marital status: Married    Spouse name: N/A  . Number of children: N/A  . Years of education: N/A   Occupational History  . Not on file.   Social History Main Topics  . Smoking status: Former Smoker    Packs/day: 0.50    Years: 36.00    Types: Cigarettes    Quit date: 07/23/2013  . Smokeless tobacco: Never Used  . Alcohol use No  . Drug use: No  . Sexual activity: Not Currently   Other Topics Concern  . Not on file   Social History Narrative   Caffeine: 4 cups coffee/day, some tea and soda   Lives with wife   Occupation: Retired   Activity: no regular exercise   Diet: good water, fruits/vegetables daily     Review of Systems: General: negative for chills, fever, night sweats or weight changes.  Cardiovascular: negative for chest pain, dyspnea on exertion, edema, orthopnea, palpitations, paroxysmal nocturnal dyspnea or shortness of breath Dermatological: negative for rash Respiratory: negative for cough or wheezing Urologic: negative for hematuria Abdominal: negative for nausea, vomiting, diarrhea, bright red blood per rectum, melena, or hematemesis Neurologic: negative for visual changes, syncope, or dizziness All other systems reviewed and are otherwise negative  except as noted above.    Blood pressure 124/82, pulse (!) 44, height 6\' 1"  (1.854 m), weight 195 lb 9.6 oz (88.7 kg).  General appearance: alert and no distress Neck: no adenopathy, no JVD, supple, symmetrical, trachea midline, thyroid not enlarged, symmetric, no tenderness/mass/nodules and Bilateral carotid bruits versus transmitted murmur Lungs: clear to auscultation bilaterally Heart: 2/6 outflow tract murmur consistent with aortic stenosis Extremities: extremities normal, atraumatic, no cyanosis or edema  EKG sinus bradycardia 44 evidence of LVH with repolarization changes and first-degree AV block as well as evidence of second-degree AV block as well. I personally reviewed this EKG  ASSESSMENT AND PLAN:   HYPERCHOLESTEROLEMIA History of hyperlipidemia on statin therapy followed by his PCP  Essential hypertension History of hypertension blood pressure measured at 124/82. He  is on lisinopril and metoprolol. Continue current meds at current dosing  Coronary atherosclerosis History of coronary artery disease status post bypass grafting X 08 March 2003 with a LIMA to his LAD, vein to the right coronary artery, obtuse marginal branch and ramus branch. At that time he was found to have bilateral renal artery stenosis. His most recent functional study performed February 2014 was nonischemic. He denies chest pain or shortness of breath.  Aortic stenosis History of moderate to severe aortic stenosis with recent 2-D echo performed in April of this year revealing a peak gradient of approximately 50 with a valve area of 0.9 cm2. He is relatively a symptomatically from this. We will continue to follow this on an annual basis. It measures he may be a candidate for a TAVR.  Atrial flutter (HCC) History of paroxysmal atrial flutter status post recent TEE DC cardioversion performed by Dr. Debara Pickett 06/25/15 successfully to sinus rhythm which he remains in. He was initially placed on Eliquis was [place]  coagulation but ultimately decided to stop this for various reasons  Renal artery stenosis History of renal artery stenosis demonstrated angiographically back in 2004 with recent Dopplers which suggested progression of disease with difficult to control hypertension. He did undergo angiography by myself 02/02/16 revealing at most 30-40% left and 50 a 60% right renal artery stenosis. I believe the Dopplers of the degree of stenosis and felt that medical therapy was warranted. Blood pressures have been under better control.      [name] MD FACP,FACC,FAHA, [facility] 06/02/2016 11:35 AM

## 2016-06-02 NOTE — Assessment & Plan Note (Signed)
History of coronary artery disease status post bypass grafting X 08 March 2003 with a LIMA to his LAD, vein to the right coronary artery, obtuse marginal branch and ramus branch. At that time he was found to have bilateral renal artery stenosis. His most recent functional study performed February 2014 was nonischemic. He denies chest pain or shortness of breath.

## 2016-06-02 NOTE — Assessment & Plan Note (Signed)
History of moderate to severe aortic stenosis with recent 2-D echo performed in April of this year revealing a peak gradient of approximately 50 with a valve area of 0.9 cm2. He is relatively a symptomatically from this. We will continue to follow this on an annual basis. It measures he may be a candidate for a TAVR.

## 2016-06-08 ENCOUNTER — Other Ambulatory Visit: Payer: Self-pay | Admitting: Family Medicine

## 2016-06-08 ENCOUNTER — Ambulatory Visit (INDEPENDENT_AMBULATORY_CARE_PROVIDER_SITE_OTHER): Payer: Medicare Other

## 2016-06-08 VITALS — BP 140/80 | HR 55 | Temp 97.8°F | Ht 68.5 in | Wt 195.5 lb

## 2016-06-08 DIAGNOSIS — E7800 Pure hypercholesterolemia, unspecified: Secondary | ICD-10-CM | POA: Diagnosis not present

## 2016-06-08 DIAGNOSIS — E118 Type 2 diabetes mellitus with unspecified complications: Secondary | ICD-10-CM | POA: Diagnosis not present

## 2016-06-08 DIAGNOSIS — E1165 Type 2 diabetes mellitus with hyperglycemia: Secondary | ICD-10-CM | POA: Diagnosis not present

## 2016-06-08 DIAGNOSIS — Z794 Long term (current) use of insulin: Secondary | ICD-10-CM | POA: Diagnosis not present

## 2016-06-08 DIAGNOSIS — IMO0002 Reserved for concepts with insufficient information to code with codable children: Secondary | ICD-10-CM

## 2016-06-08 DIAGNOSIS — Z0000 Encounter for general adult medical examination without abnormal findings: Secondary | ICD-10-CM

## 2016-06-08 LAB — LIPID PANEL
CHOLESTEROL: 131 mg/dL (ref 0–200)
HDL: 39.5 mg/dL (ref >39.00)
LDL Cholesterol: 73 mg/dL (ref 0–99)
NONHDL: 91.63
Total CHOL/HDL Ratio: 3
Triglycerides: 95 mg/dL (ref 0.0–149.0)
VLDL: 19 mg/dL (ref 0.0–40.0)

## 2016-06-08 LAB — BASIC METABOLIC PANEL
BUN: 13 mg/dL (ref 6–23)
CALCIUM: 9.6 mg/dL (ref 8.4–10.5)
CO2: 32 mEq/L (ref 19–32)
Chloride: 102 mEq/L (ref 96–112)
Creatinine, Ser: 0.86 mg/dL (ref 0.40–1.50)
GFR: 90.54 mL/min (ref >60.00)
GLUCOSE: 173 mg/dL — AB (ref 70–99)
Potassium: 4.8 mEq/L (ref 3.5–5.1)
SODIUM: 139 meq/L (ref 135–145)

## 2016-06-08 LAB — HEMOGLOBIN A1C: Hgb A1c MFr Bld: 8.3 % — ABNORMAL HIGH (ref 4.6–6.5)

## 2016-06-08 NOTE — Progress Notes (Signed)
Subjective:   Charles [name] is a 82 y.o. male who presents for Medicare Annual/Subsequent preventive examination.  Review of Systems:  N/A Cardiac Risk Factors include: advanced age (>90men, >25 women);sedentary lifestyle;diabetes mellitus;male gender;dyslipidemia;hypertension     Objective:    Vitals: BP 140/80 (BP Location: Left Arm, Patient Position: Sitting, Cuff Size: Normal)   Pulse (!) 55   Temp 97.8 F (36.6 C) (Oral)   Ht 5' 8.5" (1.74 m) Comment: pt is unable to stand erect  Wt 195 lb 8 oz (88.7 kg)   SpO2 96%   BMI 29.29 kg/m   Body mass index is 29.29 kg/m.  Tobacco History  Smoking Status  . Former Smoker  . Packs/day: 0.50  . Years: 36.00  . Types: Cigarettes  . Quit date: 07/23/2013  Smokeless Tobacco  . Never Used     Counseling given: No   Past Medical History:  Diagnosis Date  . Abdominal aortic atherosclerosis ([place])    by xray  . Aortic stenosis 06/2014   moderate-severe  . Arthritis    in lower back (06/24/2015)  . Atrial flutter ([place])    notes 06/24/2015  . 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 CAD (coronary artery disease) 2004   s/p 4v CABG (Dr. [name])  . Carotid stenosis 1999   s/p L CEA  . CHF (congestive heart failure) ([place]) dx'd 06/2015  . Colon polyp 2005   (Dr. Tiffany Kocher)  . Compression fracture of L1 lumbar vertebra (HCC) remote  . Compression fracture of L1 lumbar vertebra (HCC) 02/2016   chronic by xray  . COPD (chronic obstructive pulmonary disease) ([place]) 03/2011   by xray  . DDD (degenerative disc disease), lumbar 2014   severe L2/3 L5/S1 by xray  . Depression    hx  . Ex-smoker   . GERD (gastroesophageal reflux disease) 2003   h/o duodenal ulcer per EGD as well as esophagitis  . History of chicken pox   . History of colon polyps 2003, 2005   adenomatous Vira Agar)  . HLD (hyperlipidemia)   . HTN (hypertension)   . Psoriasis   . Renal artery stenosis ([place]) 2004   70% bilateral, followed  by cards  . Seasonal allergies   . T2DM (type 2 diabetes mellitus) ([place]) 1995  . Urge incontinence of urine    Past Surgical History:  Procedure Laterality Date  . CARDIAC CATHETERIZATION  03/06/2003   No intervention - recommend CABG  . CARDIOVASCULAR STRESS TEST  11/2010   normal perfusion, no evidence of ischemia, EF 62% post exercise  . CARDIOVASCULAR STRESS TEST  11/28/2012   Mild diaphragmatic attenuation; cannot exclude a focal region of nontransmural inferior scar  . CARDIOVERSION N/A 06/25/2015   Procedure: CARDIOVERSION;  Surgeon: Pixie Casino, MD;  Location: [facility];  Service: Cardiovascular;  Laterality: N/A;  . CAROTID ENDARTERECTOMY Left 1999   ([place])  . CATARACT EXTRACTION W/ INTRAOCULAR LENS  IMPLANT, BILATERAL Bilateral 01/2013   [place]  . COLONOSCOPY  2003   colon polyp x3 - adenomatous Tiffany Kocher)  . COLONOSCOPY  10/08/2012   2 TA, diverticulosis, int hem, no rpt rec Tiffany Kocher)  . CORONARY ANGIOPLASTY    . CORONARY ARTERY BYPASS GRAFT  03/07/2003   4v CABG ([place]) with LIMA to LAD, vein graft to RCA, 1st obtuse marginal, and ramus intermedius  . ESOPHAGOGASTRODUODENOSCOPY  10/08/2012   nl esophagus, duodenitis and erosive gastropathy, path - gastropathy no Hpylori, no rpt rec  . PERIPHERAL  VASCULAR CATHETERIZATION N/A 02/02/2016   Procedure: Renal Angiography;  Surgeon: [name], MD;  Location: [facility];  Service: Cardiovascular;  Laterality: N/A;  . PERIPHERAL VASCULAR CATHETERIZATION N/A 02/02/2016   Procedure: Abdominal Aortogram;  Surgeon: [name], MD;  Location: [facility];  Service: Cardiovascular;  Laterality: N/A;  . RENAL DOPPLER  11/29/2011   Celiac&SMA-demonstrated vessel narrowing suggestive of a greater than 50% diameter reduction. Bilateral renal arteries-demonstrated vessel narrowing of 60-99% diameter reduction. Rt Kidney-mid pole lateral simple cyst noted measuring 1.29x0.76x1.11cm and exophytic cyst outside lower pole  measuring 1.23x0.96x1.31. Lft Kidney-lateral mid to lower pole simple cyst measuering-1.24x9.83x1.24  . TEE WITHOUT CARDIOVERSION N/A 06/25/2015   Procedure: TRANSESOPHAGEAL ECHOCARDIOGRAM (TEE);  Surgeon: Pixie Casino, MD;  [facility]: [facility];  Service: Cardiovascular;  Laterality: N/A;  . TONSILLECTOMY    . UPPER GASTROINTESTINAL ENDOSCOPY  2003   reflux esophagitis, erosive gastropathy, duodenal ulcer   Family History  Problem Relation Age of Onset  . Hypertension Father   . Heart disease Father   . Cancer Mother     GI cancer  . Diabetes Sister   . Heart disease Sister   . Stroke Sister   . Hypertension Sister   . Cancer Brother     Lung cancer - nonsmoker  . Cancer Maternal Grandmother   . Heart attack Maternal Grandfather   . Heart attack Paternal Grandmother   . Heart attack Paternal Grandfather   . Cancer Brother     Pancreatic cancer  . Lung disease Brother    History  Sexual Activity  . Sexual activity: Not Currently    Outpatient Encounter Prescriptions as of 06/08/2016  Medication Sig  . acetaminophen (TYLENOL) 500 MG tablet Take 1 tablet (500 mg total) by mouth 3 (three) times daily.  [name] acetaminophen-codeine (TYLENOL #3) 300-30 MG tablet 1/2 to 1 tab po every 6 hours  . aspirin EC 81 MG tablet Take 81 mg by mouth daily.  [name] atorvastatin (LIPITOR) 20 MG tablet TAKE 1 TABLET DAILY.  [name] BAYER MICROLET LANCETS lancets Ck blood sugar three times daily and as directed. Dx 250.00. Insulin dependent  . citalopram (CELEXA) 20 MG tablet Take 1 tablet (20 mg total) by mouth daily.  . Cyanocobalamin (B-12) 500 MCG TABS Take 1 tablet by mouth daily.  . furosemide (LASIX) 40 MG tablet Take 1 tablet (40 mg total) by mouth daily.  [name] gabapentin (NEURONTIN) 100 MG capsule Take 1 capsule (100 mg total) by mouth 2 (two) times daily as needed.  [name] glucose blood (BAYER CONTOUR NEXT TEST) test strip Ck blood sugar three times daily and as directed. Dx 250.00 insulin dependent  .  insulin glargine (LANTUS) 100 UNIT/ML injection Inject 0.32 mLs (32 Units total) into the skin at bedtime.  . Insulin Syringe-Needle U-100 (B-D INS SYR ULTRAFINE 1CC/30G) 30G X 1/2" 1 ML MISC 1 Syringe by Does not apply route as directed.  [name] lisinopril (PRINIVIL,ZESTRIL) 40 MG tablet Take 1 tablet (40 mg total) by mouth daily.  . metFORMIN (GLUCOPHAGE) 1000 MG tablet Take 1 tablet (1,000 mg total) by mouth 2 (two) times daily with a meal.  . metoprolol tartrate (LOPRESSOR) 50 MG tablet Take 1 tablet (50 mg total) by mouth 2 (two) times daily.  . pantoprazole (PROTONIX) 20 MG tablet Take 1 tablet (20 mg total) by mouth daily.  . potassium chloride SA (K-DUR,KLOR-CON) 20 MEQ tablet Take 20 mEq by mouth daily.   . saw palmetto 160 MG  capsule Take 450 mg by mouth daily.   . traZODone (DESYREL) 50 MG tablet Take 2 tablets (100 mg total) by mouth at bedtime.  . vitamin C (ASCORBIC ACID) 500 MG tablet Take 500 mg by mouth daily.  . [DISCONTINUED] LORazepam (ATIVAN) 0.5 MG tablet Take 0.5-1 tablets (0.25-0.5 mg total) by mouth 2 (two) times daily as needed for anxiety.  . [DISCONTINUED] nystatin (MYCOSTATIN) 100000 UNIT/ML suspension Take 5 mLs (500,000 Units total) by mouth 3 (three) times daily.   No facility-administered encounter medications on file as of 06/08/2016.     Activities of Daily Living In your present state of health, do you have any difficulty performing the following activities: 06/08/2016 02/02/2016  Hearing? Y N  Vision? Y N  Difficulty concentrating or making decisions? Y N  Walking or climbing stairs? Y N  Dressing or bathing? N N  Doing errands, shopping? N -  Preparing Food and eating ? N -  Using the Toilet? N -  In the past six months, have you accidently leaked urine? Y -  Do you have problems with loss of bowel control? N -  Managing your Medications? N -  Managing your Finances? Y -  Housekeeping or managing your Housekeeping? Y -  Some recent data might be hidden     Patient Care Team: [name], MD as PCP - General (Family Medicine) [name], MD as Consulting Physician (Cardiology)   Assessment:     Visual Acuity Screening   Right eye Left eye Both eyes  Without correction:     With correction: 20/25 20/100 20/40  Hearing Screening Comments: Wears bilateral hearing aids   Exercise Activities and Dietary recommendations Current Exercise Habits: The patient does not participate in regular exercise at present, Exercise limited by: orthopedic condition(s)  Goals    . Increase water intake          Starting 06/08/2016, I will continue to drink at least 64 oz water daily.       Fall Risk Fall Risk  06/08/2016 10/08/2014 10/02/2013 09/15/2012  Falls in the past year? Yes No No Yes  Number falls in past yr: 1 - - 1  Injury with Fall? No - - No  Follow up Falls evaluation completed;Education provided;Falls prevention discussed - - -   Depression Screen PHQ 2/9 Scores 06/08/2016 10/08/2014 10/02/2013 09/15/2012  PHQ - 2 Score 3 0 0 0  PHQ- 9 Score 6 - - -    Cognitive Testing MMSE - Mini Mental State Exam 06/08/2016  Orientation to time 5  Orientation to Place 5  Registration 3  Attention/ Calculation 0  Recall 2  Recall-comments pt was unable to recall 1 of 3 words  Language- name 2 objects 0  Language- repeat 1  Language- follow 3 step command 3  Language- read & follow direction 0  Write a sentence 0  Copy design 0  Total score 19   PLEASE NOTE: A Mini-Cog screen was completed. Maximum score is 20. A value of 0 denotes this part of [place] MMSE was not completed or the patient failed this part of the Mini-Cog screening.   Mini-Cog Screening Orientation to Time - Max 5 pts Orientation to Place - Max 5 pts Registration - Max 3 pts Recall - Max 3 pts Language Repeat - Max 1 pts Language Follow 3 Step Command - Max 3 pts  Immunization History  Administered Date(s) Administered  . Influenza Split 07/04/2012  .  Influenza Whole 07/04/2005  .  Influenza,inj,Quad PF,36+ Mos 06/25/2015  . Influenza-Unspecified 08/10/2013, 07/04/2014  . Pneumococcal Conjugate-13 04/02/2014  . Pneumococcal Polysaccharide-23 10/05/2002  . Td 07/20/2005   Screening Tests Health Maintenance  Topic Date Due  . FOOT EXAM  06/11/2016 (Originally 04/07/2016)  . INFLUENZA VACCINE  10/03/2016 (Originally 05/04/2016)  . OPHTHALMOLOGY EXAM  10/03/2016 (Originally 09/03/2014)  . DTaP/Tdap/Td (1 - Tdap) 06/08/2017 (Originally 07/21/2005)  . ZOSTAVAX  06/08/2017 (Originally 08/28/1994)  . TETANUS/TDAP  06/08/2017 (Originally 07/21/2015)  . HEMOGLOBIN A1C  08/19/2016  . COLONOSCOPY  10/08/2018  . PNA vac Low Risk Adult  Completed      Plan:     I have personally reviewed and addressed the Medicare Annual Wellness questionnaire and have noted the following in the patient's chart:  A. Medical and social history B. Use of alcohol, tobacco or illicit drugs  C. Current medications and supplements D. Functional ability and status E.  Nutritional status F.  Physical activity G. Advance directives H. List of other physicians I.  Hospitalizations, surgeries, and ER visits in previous 12 months J.  [place] to include hearing, vision, cognitive, depression L. Referrals and appointments - none  In addition, I have reviewed and discussed with patient certain preventive protocols, quality metrics, and best practice recommendations. A written personalized care plan for preventive services as well as general preventive health recommendations were provided to patient.  See attached scanned questionnaire for additional information.   Signed,   Lindell Noe, MHA, BS, LPN Health Advisor

## 2016-06-08 NOTE — Patient Instructions (Addendum)
Charles [name] , Thank you for taking time to come for your Medicare Wellness Visit. I appreciate your ongoing commitment to your health goals. Please review the following plan we discussed and let me know if I can assist you in the future.   NOTE: PLEASE CONTACT INSURANCE COMPANY ABOUT TETANUS AND SHINGLES VACCINES.   These are the goals we discussed: Goals    . Increase water intake          Starting 06/08/2016, I will continue to drink at least 64 oz water daily.        This is a list of the screening recommended for you and due dates:  Health Maintenance  Topic Date Due  . Complete foot exam   06/11/2016*  . Flu Shot  10/03/2016*  . Eye exam for diabetics  10/03/2016*  . DTaP/Tdap/Td vaccine (1 - Tdap) 06/08/2017*  . Shingles Vaccine  06/08/2017*  . Tetanus Vaccine  06/08/2017*  . Hemoglobin A1C  08/19/2016  . Colon Cancer Screening  10/08/2018  . Pneumonia vaccines  Completed  *Topic was postponed. The date shown is not the original due date.   Preventive Care for Adults  A healthy lifestyle and preventive care can promote health and wellness. Preventive health guidelines for adults include the following key practices.  . A routine yearly physical is a good way to check with your health care provider about your health and preventive screening. It is a chance to share any concerns and updates on your health and to receive a thorough exam.  . Visit your dentist for a routine exam and preventive care every 6 months. Brush your teeth twice a day and floss once a day. Good oral hygiene prevents tooth decay and gum disease.  . The frequency of eye exams is based on your age, health, family medical history, use  of contact lenses, and other factors. Follow your health care provider's ecommendations for frequency of eye exams.  . Eat a healthy diet. Foods like vegetables, fruits, whole grains, low-fat dairy products, and lean protein foods contain the nutrients you need without too  many calories. Decrease your intake of foods high in solid fats, added sugars, and salt. Eat the right amount of calories for you. Get information about a proper diet from your health care provider, if necessary.  . Regular physical exercise is one of the most important things you can do for your health. Most adults should get at least 150 minutes of moderate-intensity exercise (any activity that increases your heart rate and causes you to sweat) each week. In addition, most adults need muscle-strengthening exercises on 2 or more days a week.  Silver Sneakers may be a benefit available to you. To determine eligibility, you may visit the website: www.silversneakers.com or contact program at (518)192-6180 Mon-Fri between 8AM-8PM.   . Maintain a healthy weight. The body mass index (BMI) is a screening tool to identify possible weight problems. It provides an estimate of body fat based on height and weight. Your health care provider can find your BMI and can help you achieve or maintain a healthy weight.   For adults 20 years and older: ? A BMI below 18.5 is considered underweight. ? A BMI of 18.5 to 24.9 is normal. ? A BMI of 25 to 29.9 is considered overweight. ? A BMI of 30 and above is considered obese.   . Maintain normal blood lipids and cholesterol levels by exercising and minimizing your intake of saturated fat. Eat a balanced diet  with plenty of fruit and vegetables. Blood tests for lipids and cholesterol should begin at age 66 and be repeated every 5 years. If your lipid or cholesterol levels are high, you are over 50, or you are at high risk for heart disease, you may need your cholesterol levels checked more frequently. Ongoing high lipid and cholesterol levels should be treated with medicines if diet and exercise are not working.  . If you smoke, find out from your health care provider how to quit. If you do not use tobacco, please do not start.  . If you choose to drink alcohol, please  do not consume more than 2 drinks per day. One drink is considered to be 12 ounces (355 mL) of beer, 5 ounces (148 mL) of wine, or 1.5 ounces (44 mL) of liquor.  . If you are 36-64 years old, ask your health care provider if you should take aspirin to prevent strokes.  . Use sunscreen. Apply sunscreen liberally and repeatedly throughout the day. You should seek shade when your shadow is shorter than you. Protect yourself by wearing long sleeves, pants, a wide-brimmed hat, and sunglasses year round, whenever you are outdoors.  . Once a month, do a whole body skin exam, using a mirror to look at the skin on your back. Tell your health care provider of new moles, moles that have irregular borders, moles that are larger than a pencil eraser, or moles that have changed in shape or color.    Fall Prevention in the Home  Falls can cause injuries. They can happen to people of all ages. There are many things you can do to make your home safe and to help prevent falls.  WHAT CAN I DO ON THE OUTSIDE OF MY HOME?  Regularly fix the edges of walkways and driveways and fix any cracks.  Remove anything that might make you trip as you walk through a door, such as a raised step or threshold.  Trim any bushes or trees on the path to your home.  Use bright outdoor lighting.  Clear any walking paths of anything that might make someone trip, such as rocks or tools.  Regularly check to see if handrails are loose or broken. Make sure that both sides of any steps have handrails.  Any raised decks and porches should have guardrails on the edges.  Have any leaves, snow, or ice cleared regularly.  Use sand or salt on walking paths during winter.  Clean up any spills in your garage right away. This includes oil or grease spills. WHAT CAN I DO IN THE BATHROOM?   Use night lights.  Install grab bars by the toilet and in the tub and shower. Do not use towel bars as grab bars.  Use non-skid mats or decals in  the tub or shower.  If you need to sit down in the shower, use a plastic, non-slip stool.  Keep the floor dry. Clean up any water that spills on the floor as soon as it happens.  Remove soap buildup in the tub or shower regularly.  Attach bath mats securely with double-sided non-slip rug tape.  Do not have throw rugs and other things on the floor that can make you trip. WHAT CAN I DO IN THE BEDROOM?  Use night lights.  Make sure that you have a light by your bed that is easy to reach.  Do not use any sheets or blankets that are too big for your bed. They should not  hang down onto the floor.  Have a firm chair that has side arms. You can use this for support while you get dressed.  Do not have throw rugs and other things on the floor that can make you trip. WHAT CAN I DO IN THE KITCHEN?  Clean up any spills right away.  Avoid walking on wet floors.  Keep items that you use a lot in easy-to-reach places.  If you need to reach something above you, use a strong step stool that has a grab bar.  Keep electrical cords out of the way.  Do not use floor polish or wax that makes floors slippery. If you must use wax, use non-skid floor wax.  Do not have throw rugs and other things on the floor that can make you trip. WHAT CAN I DO WITH MY STAIRS?  Do not leave any items on the stairs.  Make sure that there are handrails on both sides of the stairs and use them. Fix handrails that are broken or loose. Make sure that handrails are as long as the stairways.  Check any carpeting to make sure that it is firmly attached to the stairs. Fix any carpet that is loose or worn.  Avoid having throw rugs at the top or bottom of the stairs. If you do have throw rugs, attach them to the floor with carpet tape.  Make sure that you have a light switch at the top of the stairs and the bottom of the stairs. If you do not have them, ask someone to add them for you. WHAT ELSE CAN I DO TO HELP PREVENT  FALLS?  Wear shoes that:  Do not have high heels.  Have rubber bottoms.  Are comfortable and fit you well.  Are closed at the toe. Do not wear sandals.  If you use a stepladder:  Make sure that it is fully opened. Do not climb a closed stepladder.  Make sure that both sides of the stepladder are locked into place.  Ask someone to hold it for you, if possible.  Clearly mark and make sure that you can see:  Any grab bars or handrails.  First and last steps.  Where the edge of each step is.  Use tools that help you move around (mobility aids) if they are needed. These include:  Canes.  Walkers.  Scooters.  Crutches.  Turn on the lights when you go into a dark area. Replace any light bulbs as soon as they burn out.  Set up your furniture so you have a clear path. Avoid moving your furniture around.  If any of your floors are uneven, fix them.  If there are any pets around you, be aware of where they are.  Review your medicines with your doctor. Some medicines can make you feel dizzy. This can increase your chance of falling. Ask your doctor what other things that you can do to help prevent falls.   This information is not intended to replace advice given to you by your health care provider. Make sure you discuss any questions you have with your health care provider.   Document Released: 07/17/2009 Document Revised: 02/04/2015 Document Reviewed: 10/25/2014 Elsevier Interactive Patient Education [employer].

## 2016-06-08 NOTE — Progress Notes (Signed)
Pre visit review using our clinic review tool, if applicable. No additional management support is needed unless otherwise documented below in the visit note. 

## 2016-06-08 NOTE — Progress Notes (Signed)
PCP notes:   Health maintenance:  Shingles - insurance Tetanus - insurance Flu - addressed Eye - will schedule appt Foot - will complete at CPE  Abnormal screenings:   Depression PHQ2/PHQ9: 6 (18 min spent on screening; pt being referred to PCP for further eval and treatment) Mini-Cog score: 19/20 Fall risk: Hx of fall within 12 months  Patient concerns:   Back pain - pt wants injection Urinary frequency - reports going to bathroom every 25-30 min  Nurse concerns:  None   Next PCP appt:   06/11/16 @ 1600

## 2016-06-09 NOTE — Progress Notes (Signed)
I reviewed health advisor's note, was available for consultation, and agree with documentation and plan.  

## 2016-06-11 ENCOUNTER — Encounter: Payer: Self-pay | Admitting: Family Medicine

## 2016-06-11 ENCOUNTER — Ambulatory Visit (INDEPENDENT_AMBULATORY_CARE_PROVIDER_SITE_OTHER): Payer: Medicare Other | Admitting: Family Medicine

## 2016-06-11 VITALS — BP 140/90 | HR 64 | Temp 97.5°F | Wt 193.2 lb

## 2016-06-11 DIAGNOSIS — I10 Essential (primary) hypertension: Secondary | ICD-10-CM

## 2016-06-11 DIAGNOSIS — M5417 Radiculopathy, lumbosacral region: Secondary | ICD-10-CM | POA: Diagnosis not present

## 2016-06-11 DIAGNOSIS — I483 Typical atrial flutter: Secondary | ICD-10-CM

## 2016-06-11 DIAGNOSIS — Z0001 Encounter for general adult medical examination with abnormal findings: Secondary | ICD-10-CM | POA: Diagnosis not present

## 2016-06-11 DIAGNOSIS — M5416 Radiculopathy, lumbar region: Secondary | ICD-10-CM

## 2016-06-11 DIAGNOSIS — IMO0002 Reserved for concepts with insufficient information to code with codable children: Secondary | ICD-10-CM

## 2016-06-11 DIAGNOSIS — Z0000 Encounter for general adult medical examination without abnormal findings: Secondary | ICD-10-CM

## 2016-06-11 DIAGNOSIS — I350 Nonrheumatic aortic (valve) stenosis: Secondary | ICD-10-CM

## 2016-06-11 DIAGNOSIS — E118 Type 2 diabetes mellitus with unspecified complications: Secondary | ICD-10-CM | POA: Diagnosis not present

## 2016-06-11 DIAGNOSIS — E1165 Type 2 diabetes mellitus with hyperglycemia: Secondary | ICD-10-CM

## 2016-06-11 DIAGNOSIS — Z794 Long term (current) use of insulin: Secondary | ICD-10-CM

## 2016-06-11 DIAGNOSIS — R300 Dysuria: Secondary | ICD-10-CM | POA: Diagnosis not present

## 2016-06-11 DIAGNOSIS — F331 Major depressive disorder, recurrent, moderate: Secondary | ICD-10-CM

## 2016-06-11 DIAGNOSIS — E7800 Pure hypercholesterolemia, unspecified: Secondary | ICD-10-CM

## 2016-06-11 LAB — POC URINALSYSI DIPSTICK (AUTOMATED)
Bilirubin, UA: NEGATIVE
GLUCOSE UA: NEGATIVE
Ketones, UA: NEGATIVE
Leukocytes, UA: NEGATIVE
NITRITE UA: NEGATIVE
PH UA: 6
Protein, UA: NEGATIVE
RBC UA: NEGATIVE
Spec Grav, UA: >=1.03
UROBILINOGEN UA: 0.2

## 2016-06-11 MED ORDER — HYDROCODONE-ACETAMINOPHEN 5-325 MG PO TABS: 0.5000 | ORAL_TABLET | Freq: Two times a day (BID) | ORAL | 0 refills | Status: DC | PRN

## 2016-06-11 MED ORDER — INSULIN GLARGINE 100 UNIT/ML ~~LOC~~ SOLN: 35.0000 [IU] | Freq: Every day | SUBCUTANEOUS | 3 refills | Status: DC

## 2016-06-11 NOTE — Assessment & Plan Note (Addendum)
Right radiculopathy largely improved, but persistent and progressively worsening lumbar back pain, some concern for spinal stenosis. No neurogenic claudication symptoms at this time. Pain not improved with conservative management of tylenol #3, gabapentin, other oral meds. Want to avoid NSAIDs given GI history. Will increase pain regimen to hydrocodone apap and add senna as bowel regimen prn constipation  Will order lumbar MRI to eval for targets for procedural intervention. Pt and son agree.

## 2016-06-11 NOTE — Assessment & Plan Note (Signed)
Chronic, uncontrolled. Continue metformin 1000mg  bid, will bump lantus to 35u daily. Denies hypoglycemia. Endorses more difficulty managing glycemic control since wife's death.

## 2016-06-11 NOTE — Assessment & Plan Note (Addendum)
Chronic, improved. Continue current regimen.  

## 2016-06-11 NOTE — Progress Notes (Signed)
Pre visit review using our clinic review tool, if applicable. No additional management support is needed unless otherwise documented below in the visit note. 

## 2016-06-11 NOTE — Assessment & Plan Note (Signed)
Sounds regular today.  ?

## 2016-06-11 NOTE — Assessment & Plan Note (Signed)
Followed closely by cardiology/  

## 2016-06-11 NOTE — Assessment & Plan Note (Signed)
Chronic, stable. Great control on lipitor 20gm daily.

## 2016-06-11 NOTE — Progress Notes (Signed)
BP 140/90   Pulse 64   Temp 97.5 F (36.4 C) (Oral)   Wt 193 lb 4 oz (87.7 kg)   BMI 28.96 kg/m    CC: CPE/f/u visit after medicare wellness visit Subjective:    Patient ID: Charles [name], male    DOB: May 09, 1932, 82 y.o.   MRN: [id number]  HPI: Charles [name] is a 83 y.o. male presenting on 06/11/2016 for Annual Exam   Here with son Charles [name] today. Widower - wife passed 07/2015.  Saw Lesia last week for medicare wellness visit, note reviewed. PHQ9 = 6 at that time. Ongoing back pain - see prior note for details. Presumed lumbar radiculopathy despite tylenol #3 + gabapentin. No improvement - would like to proceed with lumbar MRI and referral to spine clinic. Denies fevers, leg numbness or weakness, bowel/bladder accidents or saddle anesthesia. Needs to ride shopping cart when he goes to grocery store due to back pain. Activity limiting pain. Worse pain with spine extension, no pain with sitting.   Saw Dr [name] last month - overall stable readings. H/o parox atrial flutter s/p successful TEE DC cardioversion 06/2015. Eliquis discontinued.   HTN - better controlled on current regimen, brings log. Denies dizziness.   Increased urinary urgency/frequency over last [redacted] week along with some dysuria. No hematuria. Some dribbling at end of stream. No fevers/chills, nausea, flank pain.   Diabetic Foot Exam - Simple   Simple Foot Form Diabetic Foot exam was performed with the following findings:  Yes 06/11/2016  4:51 PM  Visual Inspection No deformities, no ulcerations, no other skin breakdown bilaterally:  Yes Sensation Testing Intact to touch and monofilament testing bilaterally:  Yes Pulse Check See comments:  Yes Comments Some maceration between 4th/5th toes Diminished DP 1+ bilaterally      Preventative: Prostate screening - had screening by [facility]) about 2011. Normal. Aged out. COLONOSCOPY Date: 10/08/2012 2 TA, diverticulosis, int hem, no rpt rec Tiffany Kocher)    ESOPHAGOGASTRODUODENOSCOPY Date: 10/08/2012 nl esophagus, duodenitis and erosive gastropathy, path - gastropathy no Hpylori Flu shot yearly Td 2006. Pneumovax 2004, prevnar 2015  zostavax - discussed. Has had shingles illness x 2. Not interested. Advanced directives: has forms, has not filled out. Unsure about HCPOA. Doesn't want prolonged life support.  Caffeine: 4 cups coffee/day, some tea and soda  Lives with wife  Occupation: Retired  Activity: no regular exercise  Diet: good water, fruits/vegetables daily  Relevant past medical, surgical, family and social history reviewed and updated as indicated. Interim medical history since our last visit reviewed. Allergies and medications reviewed and updated. Current Outpatient Prescriptions on File Prior to Visit  Medication Sig  . acetaminophen (TYLENOL) 500 MG tablet Take 1 tablet (500 mg total) by mouth 3 (three) times daily.  [name] aspirin EC 81 MG tablet Take 81 mg by mouth daily.  [name] atorvastatin (LIPITOR) 20 MG tablet TAKE 1 TABLET DAILY.  [name] BAYER MICROLET LANCETS lancets Ck blood sugar three times daily and as directed. Dx 250.00. Insulin dependent  . citalopram (CELEXA) 20 MG tablet Take 1 tablet (20 mg total) by mouth daily.  . Cyanocobalamin (B-12) 500 MCG TABS Take 1 tablet by mouth daily.  . furosemide (LASIX) 40 MG tablet Take 1 tablet (40 mg total) by mouth daily.  [name] gabapentin (NEURONTIN) 100 MG capsule Take 1 capsule (100 mg total) by mouth 2 (two) times daily as needed.  [name] glucose blood (BAYER CONTOUR NEXT TEST) test strip Ck blood sugar three times daily  and as directed. Dx 250.00 insulin dependent  . Insulin Syringe-Needle U-100 (B-D INS SYR ULTRAFINE 1CC/30G) 30G X 1/2" 1 ML MISC 1 Syringe by Does not apply route as directed.  [name] lisinopril (PRINIVIL,ZESTRIL) 40 MG tablet Take 1 tablet (40 mg total) by mouth daily.  . metFORMIN (GLUCOPHAGE) 1000 MG tablet Take 1 tablet (1,000 mg total) by mouth 2 (two) times daily with a  meal.  . metoprolol tartrate (LOPRESSOR) 50 MG tablet Take 1 tablet (50 mg total) by mouth 2 (two) times daily.  . pantoprazole (PROTONIX) 20 MG tablet Take 1 tablet (20 mg total) by mouth daily.  . potassium chloride SA (K-DUR,KLOR-CON) 20 MEQ tablet Take 20 mEq by mouth daily.   . saw palmetto 160 MG capsule Take 450 mg by mouth daily.   . traZODone (DESYREL) 50 MG tablet Take 2 tablets (100 mg total) by mouth at bedtime.  . vitamin C (ASCORBIC ACID) 500 MG tablet Take 500 mg by mouth daily.   No current facility-administered medications on file prior to visit.     Review of Systems  Constitutional: Negative for activity change, appetite change, chills, fatigue, fever and unexpected weight change.  HENT: Negative for hearing loss.   Eyes: Negative for visual disturbance.  Respiratory: Negative for cough, chest tightness, shortness of breath and wheezing.   Cardiovascular: Positive for leg swelling (ankles). Negative for chest pain and palpitations.  Gastrointestinal: Negative for abdominal distention, abdominal pain, blood in stool, constipation, diarrhea, nausea and vomiting.       H/o ulcer, rare abd discomfort  Genitourinary: Negative for difficulty urinating and hematuria.  Musculoskeletal: Negative for arthralgias, myalgias and neck pain.  Skin: Negative for rash.  Neurological: Negative for dizziness, seizures, syncope and headaches.  Hematological: Negative for adenopathy. Does not bruise/bleed easily.  Psychiatric/Behavioral: Positive for dysphoric mood (stable). The patient is not nervous/anxious.    Per HPI unless specifically indicated in ROS section     Objective:    BP 140/90   Pulse 64   Temp 97.5 F (36.4 C) (Oral)   Wt 193 lb 4 oz (87.7 kg)   BMI 28.96 kg/m   Wt Readings from Last 3 Encounters:  06/11/16 193 lb 4 oz (87.7 kg)  06/08/16 195 lb 8 oz (88.7 kg)  06/02/16 195 lb 9.6 oz (88.7 kg)    Physical Exam  Constitutional: He is oriented to person,  place, and time. He appears well-developed and well-nourished. No distress.  HENT:  Head: Normocephalic and atraumatic.  Right Ear: Hearing, tympanic membrane, external ear and ear canal normal.  Left Ear: Hearing, tympanic membrane, external ear and ear canal normal.  Nose: Nose normal.  Mouth/Throat: Uvula is midline, oropharynx is clear and moist and mucous membranes are normal. No oropharyngeal exudate, posterior oropharyngeal edema or posterior oropharyngeal erythema.  Eyes: Conjunctivae and EOM are normal. Pupils are equal, round, and reactive to light. No scleral icterus.  Neck: Normal range of motion. Neck supple.  Cardiovascular: Normal rate, regular rhythm and intact distal pulses.   Murmur (4/6 systolic best at upper sternum) heard. Pulses:      Radial pulses are 2+ on the right side, and 2+ on the left side.  Pulmonary/Chest: Effort normal and breath sounds normal. No respiratory distress. He has no wheezes. He has no rales.  Abdominal: Soft. Bowel sounds are normal. He exhibits no distension and no mass. There is no tenderness. There is no rebound and no guarding.  Musculoskeletal: Normal range of motion. He exhibits  edema (1+ pedal edema).  Foot exam per HPI Tenderness to palpation lower lumbar midline spine  Neg seated SLR bilaterally Diminished DP bilaterally  Lymphadenopathy:    He has no cervical adenopathy.  Neurological: He is alert and oriented to person, place, and time.  CN grossly intact, station and gait intact  Skin: Skin is warm and dry. No rash noted.  Psychiatric: He has a normal mood and affect. His behavior is normal. Judgment and thought content normal.  Nursing note and vitals reviewed.  Results for orders placed or performed in visit on 06/11/16  POCT Urinalysis Dipstick (Automated)  Result Value Ref Range   Color, UA Yellow    Clarity, UA Clear    Glucose, UA Negative    Bilirubin, UA Negative    Ketones, UA Negative    Spec Grav, UA >=1.030     Blood, UA Negative    pH, UA 6.0    Protein, UA Negative    Urobilinogen, UA 0.2    Nitrite, UA Negative    Leukocytes, UA Negative Negative      Assessment & Plan:   Problem List Items Addressed This Visit    Aortic stenosis    Followed closely by cardiology.       Atrial flutter ([place])    Sounds regular today.       Diabetes mellitus type 2, uncontrolled, with complications (HCC)    Chronic, uncontrolled. Continue metformin 1000mg  bid, will bump lantus to 35u daily. Denies hypoglycemia. Endorses more difficulty managing glycemic control since wife's death.       Relevant Medications   insulin glargine (LANTUS) 100 UNIT/ML injection   Dysuria    UA clear today. Encouraged good water intake, avoid caffeine and other bladder irritants.       Relevant Orders   POCT Urinalysis Dipstick (Automated) (Completed)   Essential hypertension    Chronic, improved. Continue current regimen.      HYPERCHOLESTEROLEMIA    Chronic, stable. Great control on lipitor 20gm daily.      Lumbar back pain with radiculopathy affecting right lower extremity - Primary    Right radiculopathy largely improved, but persistent and progressively worsening lumbar back pain, some concern for spinal stenosis. No neurogenic claudication symptoms at this time. Pain not improved with conservative management of tylenol #3, gabapentin, other oral meds. Want to avoid NSAIDs given GI history. Will increase pain regimen to hydrocodone apap and add senna as bowel regimen prn constipation  Will order lumbar MRI to eval for targets for procedural intervention. Pt and son agree.      Relevant Medications   HYDROcodone-acetaminophen (NORCO/VICODIN) 5-325 MG tablet   Other Relevant Orders   MR Lumbar Spine Wo Contrast   MDD (major depressive disorder), recurrent episode, moderate ([place])    Chronic MDD + bereavement from wife's passing 07/2015. Pt feels stable on current regimen of celexa 20mg  daily + trazodone 100mg   nightly. Now off ativan.        Other Visit Diagnoses   None.      Follow up plan: Return in about 3 months (around 09/10/2016) for follow up visit.  [name], MD

## 2016-06-11 NOTE — Assessment & Plan Note (Signed)
UA clear today. Encouraged good water intake, avoid caffeine and other bladder irritants.

## 2016-06-11 NOTE — Patient Instructions (Addendum)
Stop tylenol #3s. Start hydrocodone 5/325mg  1/2-1 tablet twice daily as needed for back pain. May add senna if constipation develops. May continue gabapentin.  Bump up lantus to 35 units daily. Urinalysis today We will order MRI of lumbar spine.  Return as needed or in 2-3 months for follow up visit.

## 2016-06-11 NOTE — Assessment & Plan Note (Signed)
Chronic MDD + bereavement from wife's passing 07/2015. Pt feels stable on current regimen of celexa 20mg  daily + trazodone 100mg  nightly. Now off ativan.

## 2016-06-16 ENCOUNTER — Ambulatory Visit
Admission: RE | Admit: 2016-06-16 | Discharge: 2016-06-16 | Disposition: A | Discharge status: Home / Self Care | Payer: Medicare Other | Source: Ambulatory Visit | Attending: Family Medicine | Admitting: Family Medicine

## 2016-06-16 ENCOUNTER — Other Ambulatory Visit: Payer: Self-pay | Admitting: Family Medicine

## 2016-06-16 DIAGNOSIS — M4806 Spinal stenosis, lumbar region: Secondary | ICD-10-CM | POA: Diagnosis not present

## 2016-06-16 DIAGNOSIS — G8929 Other chronic pain: Secondary | ICD-10-CM

## 2016-06-16 DIAGNOSIS — M5416 Radiculopathy, lumbar region: Secondary | ICD-10-CM

## 2016-06-16 DIAGNOSIS — M48061 Spinal stenosis, lumbar region without neurogenic claudication: Secondary | ICD-10-CM

## 2016-06-16 DIAGNOSIS — M5126 Other intervertebral disc displacement, lumbar region: Secondary | ICD-10-CM

## 2016-06-21 ENCOUNTER — Telehealth: Payer: Self-pay | Admitting: Family Medicine

## 2016-06-21 ENCOUNTER — Other Ambulatory Visit: Payer: Self-pay | Admitting: Family Medicine

## 2016-06-21 NOTE — Telephone Encounter (Signed)
Maudie Mercury, Can you please notify pt of his MRI results so I can get more information about where he wants to go for PMR referral.  Thanks

## 2016-06-21 NOTE — Telephone Encounter (Signed)
Results given to patient and his son Charles [name]. Patient does want the referral and prefers [facility].  Please call patient's son Charles [name]) and get this scheduled thru him.

## 2016-06-21 NOTE — Telephone Encounter (Signed)
[facility] Dr. Nelva Bush 07/05/16 @ 1245pm/1215 arrival  pt son [name] aware of appt -arc

## 2016-06-26 NOTE — Assessment & Plan Note (Signed)
Preventative protocols reviewed and updated unless pt declined. Discussed healthy diet and lifestyle.  

## 2016-06-29 ENCOUNTER — Other Ambulatory Visit: Payer: Self-pay | Admitting: Family Medicine

## 2016-07-01 ENCOUNTER — Telehealth: Payer: Self-pay | Admitting: Family Medicine

## 2016-07-01 NOTE — Telephone Encounter (Signed)
Pt has appointment with ortho on Monday and needs a copy of mri of his back Please call when ready for pick up

## 2016-07-02 NOTE — Telephone Encounter (Signed)
Disc is ready for pickup, lmom notifying pt

## 2016-07-05 DIAGNOSIS — M4807 Spinal stenosis, lumbosacral region: Secondary | ICD-10-CM | POA: Diagnosis not present

## 2016-07-05 DIAGNOSIS — M5136 Other intervertebral disc degeneration, lumbar region: Secondary | ICD-10-CM | POA: Diagnosis not present

## 2016-07-08 ENCOUNTER — Telehealth: Payer: Self-pay

## 2016-07-08 NOTE — Telephone Encounter (Signed)
Let's increase lantus by 1 unit every day as long as fasting sugars above 150. Increase to max 45u. Then if sugars start dropping, ok to drop lantus by 1 unit per day.  Call us with update in 10 days.

## 2016-07-08 NOTE — Telephone Encounter (Signed)
Charles [name] left v/m (DPR signed); pt was seen by Dr Nelva Bush and was given steroids for back pain; steroids has helped back pain but causing blood sugar to be elevated; averaging around 300. Charles [name] wants to know if need to adjust insulin dose while taking steroids. Charles [name] request cb.

## 2016-07-08 NOTE — Telephone Encounter (Signed)
Patients son notified and verbalized understanding.

## 2016-07-21 DIAGNOSIS — M5136 Other intervertebral disc degeneration, lumbar region: Secondary | ICD-10-CM | POA: Diagnosis not present

## 2016-07-23 ENCOUNTER — Other Ambulatory Visit: Payer: Self-pay | Admitting: Family Medicine

## 2016-07-26 ENCOUNTER — Other Ambulatory Visit: Payer: Self-pay | Admitting: Family Medicine

## 2016-08-02 ENCOUNTER — Other Ambulatory Visit: Payer: Self-pay | Admitting: Family Medicine

## 2016-08-12 DIAGNOSIS — M5136 Other intervertebral disc degeneration, lumbar region: Secondary | ICD-10-CM | POA: Diagnosis not present

## 2016-08-12 DIAGNOSIS — M4807 Spinal stenosis, lumbosacral region: Secondary | ICD-10-CM | POA: Diagnosis not present

## 2016-09-01 DIAGNOSIS — M5136 Other intervertebral disc degeneration, lumbar region: Secondary | ICD-10-CM | POA: Diagnosis not present

## 2016-09-03 ENCOUNTER — Other Ambulatory Visit: Payer: Self-pay | Admitting: Family Medicine

## 2016-09-06 ENCOUNTER — Other Ambulatory Visit: Payer: Self-pay | Admitting: Family Medicine

## 2016-10-06 ENCOUNTER — Other Ambulatory Visit: Payer: Self-pay | Admitting: Family Medicine

## 2016-10-11 ENCOUNTER — Other Ambulatory Visit: Payer: Self-pay | Admitting: Family Medicine

## 2016-10-13 ENCOUNTER — Other Ambulatory Visit: Payer: Self-pay | Admitting: *Deleted

## 2016-10-18 ENCOUNTER — Other Ambulatory Visit: Payer: Self-pay | Admitting: Family Medicine

## 2016-10-22 ENCOUNTER — Other Ambulatory Visit: Payer: Self-pay | Admitting: Family Medicine

## 2016-10-28 ENCOUNTER — Encounter: Payer: Self-pay | Admitting: Physician Assistant

## 2016-10-28 ENCOUNTER — Telehealth: Payer: Self-pay | Admitting: Cardiovascular Disease

## 2016-10-28 ENCOUNTER — Encounter: Payer: Self-pay | Admitting: Family Medicine

## 2016-10-28 ENCOUNTER — Ambulatory Visit (INDEPENDENT_AMBULATORY_CARE_PROVIDER_SITE_OTHER): Payer: Medicare Other | Admitting: Family Medicine

## 2016-10-28 VITALS — BP 146/86 | HR 36 | Temp 97.7°F | Wt 205.5 lb

## 2016-10-28 DIAGNOSIS — E1165 Type 2 diabetes mellitus with hyperglycemia: Secondary | ICD-10-CM

## 2016-10-28 DIAGNOSIS — R0601 Orthopnea: Secondary | ICD-10-CM

## 2016-10-28 DIAGNOSIS — Z794 Long term (current) use of insulin: Secondary | ICD-10-CM | POA: Diagnosis not present

## 2016-10-28 DIAGNOSIS — M5416 Radiculopathy, lumbar region: Secondary | ICD-10-CM

## 2016-10-28 DIAGNOSIS — I350 Nonrheumatic aortic (valve) stenosis: Secondary | ICD-10-CM

## 2016-10-28 DIAGNOSIS — IMO0002 Reserved for concepts with insufficient information to code with codable children: Secondary | ICD-10-CM

## 2016-10-28 DIAGNOSIS — R001 Bradycardia, unspecified: Secondary | ICD-10-CM

## 2016-10-28 DIAGNOSIS — E118 Type 2 diabetes mellitus with unspecified complications: Secondary | ICD-10-CM

## 2016-10-28 DIAGNOSIS — I483 Typical atrial flutter: Secondary | ICD-10-CM

## 2016-10-28 DIAGNOSIS — G8929 Other chronic pain: Secondary | ICD-10-CM

## 2016-10-28 LAB — CBC WITH DIFFERENTIAL/PLATELET
BASOS PCT: 0.5 % (ref 0.0–3.0)
Basophils Absolute: 0 10*3/uL (ref 0.0–0.1)
EOS ABS: 0.1 10*3/uL (ref 0.0–0.7)
EOS PCT: 0.9 % (ref 0.0–5.0)
HCT: 43.6 % (ref 39.0–52.0)
Hemoglobin: 14.6 g/dL (ref 13.0–17.0)
LYMPHS ABS: 1.1 10*3/uL (ref 0.7–4.0)
Lymphocytes Relative: 14 % (ref 12.0–46.0)
MCHC: 33.5 g/dL (ref 30.0–36.0)
MCV: 91.2 fl (ref 78.0–100.0)
MONO ABS: 0.7 10*3/uL (ref 0.1–1.0)
Monocytes Relative: 8.4 % (ref 3.0–12.0)
NEUTROS PCT: 76.2 % (ref 43.0–77.0)
Neutro Abs: 6.1 10*3/uL (ref 1.4–7.7)
PLATELETS: 197 10*3/uL (ref 150.0–400.0)
RBC: 4.78 Mil/uL (ref 4.22–5.81)
RDW: 14.5 % (ref 11.5–15.5)
WBC: 8 10*3/uL (ref 4.0–10.5)

## 2016-10-28 LAB — BASIC METABOLIC PANEL
BUN: 27 mg/dL — AB (ref 6–23)
CHLORIDE: 106 meq/L (ref 96–112)
CO2: 30 mEq/L (ref 19–32)
CREATININE: 1.08 mg/dL (ref 0.40–1.50)
Calcium: 10.5 mg/dL (ref 8.4–10.5)
GFR: 69.54 mL/min (ref >60.00)
GLUCOSE: 145 mg/dL — AB (ref 70–99)
Potassium: 5.2 mEq/L — ABNORMAL HIGH (ref 3.5–5.1)
Sodium: 141 mEq/L (ref 135–145)

## 2016-10-28 LAB — TROPONIN I: TNIDX: 0.07 ug/L — AB (ref 0.00–0.06)

## 2016-10-28 LAB — HEMOGLOBIN A1C: Hgb A1c MFr Bld: 8.4 % — ABNORMAL HIGH (ref 4.6–6.5)

## 2016-10-28 LAB — BRAIN NATRIURETIC PEPTIDE: PRO B NATRI PEPTIDE: 812 pg/mL — AB (ref 0.0–100.0)

## 2016-10-28 LAB — TSH: TSH: 3.51 u[IU]/mL (ref 0.35–4.50)

## 2016-10-28 MED ORDER — FUROSEMIDE 40 MG PO TABS: 80.0000 mg | ORAL_TABLET | Freq: Every day | ORAL | 1 refills | Status: DC

## 2016-10-28 MED ORDER — APIXABAN 5 MG PO TABS: 5.0000 mg | ORAL_TABLET | Freq: Two times a day (BID) | ORAL | 0 refills | Status: DC

## 2016-10-28 NOTE — Assessment & Plan Note (Signed)
Improved after 2nd ESI ([place])

## 2016-10-28 NOTE — Patient Instructions (Addendum)
Increase furosemide to 2 tablets (80mg ) daily for next week.  Heart rate was too slow - decrease metoprolol to 25mg  twice daily.  EKG today. Labs today. Schedule follow up with Dr [name] - see Rosaria Ferries on your way out.

## 2016-10-28 NOTE — Addendum Note (Signed)
Addended by: [name] on: 10/28/2016 12:14 PM   Modules accepted: Orders

## 2016-10-28 NOTE — Assessment & Plan Note (Signed)
?  progression of mod-severe AS. Expedite cards referral. With weight gain noted, endorsed pedal edema and orthopnea, will increase lasix to 80mg  daily x 1 wk.

## 2016-10-28 NOTE — Progress Notes (Addendum)
BP (!) 146/86   Pulse (!) 36 Comment: manual check  Temp 97.7 F (36.5 C) (Oral)   Wt 205 lb 8 oz (93.2 kg)   SpO2 95%   BMI 30.79 kg/m    CC: swelling, shortness of breath Subjective:    Patient ID: Charles [name], male    DOB: [birth year], 83 y.o.   MRN: [id number]  HPI: GUERRY PONZI is a 83 y.o. male presenting on 10/28/2016 for Edema and Shortness of Breath (when laying down)   Here with son [name] today. 2 wk h/o increased pedal edema and orthopnea and exertional dyspnea. Sleeps on 3 pilows each night. Does better when he sits up. Some chest tightness. Some nausea last night.  Bad nosebleed a few weeks ago.  Today ears feel congested. Increased urinary frequency noted recently, but small amounts. Doesn't feel lasix 40mg  is as effective as previously.   No fevers/chills, palpitations or racing heart.  No change in diet or increased salt recently   10lb weight gain noted over last 4 months.  Takes lasix 40mg  once daily as well as lisinopril 40mg  daily. Unsure if he's taking potassium.  Does not check daily weights.  Known mod-severe aortic stenosis with parox atrial flutter s/p successful TEE DC cardioversion 06/2015. Eliquis discontinued. Latest echo 01/2016.  H/o CAD s/p 4v CABG 03/2003. Known 70% RAS with recent progression.  S/p L CEA.  Planned cardiology f/u next month.   For R lumbar radiculopathy - had ESI late last year by Dr Nelva Bush which was helpful.  Relevant past medical, surgical, family and social history reviewed and updated as indicated. Interim medical history since our last visit reviewed. Allergies and medications reviewed and updated. Current Outpatient Prescriptions on File Prior to Visit  Medication Sig  . acetaminophen (TYLENOL) 500 MG tablet Take 1 tablet (500 mg total) by mouth 3 (three) times daily.  [name] atorvastatin (LIPITOR) 20 MG tablet TAKE 1 TABLET DAILY.  [name] BAYER MICROLET LANCETS lancets Ck blood sugar three times daily and as directed. Dx  250.00. Insulin dependent  . citalopram (CELEXA) 20 MG tablet Take 1 tablet (20 mg total) by mouth daily.  . Cyanocobalamin (B-12) 500 MCG TABS Take 1 tablet by mouth daily.  [name] gabapentin (NEURONTIN) 100 MG capsule Take 1 capsule (100 mg total) by mouth 2 (two) times daily as needed.  [name] glucose blood (BAYER CONTOUR NEXT TEST) test strip Ck blood sugar three times daily and as directed. Dx 250.00 insulin dependent  . HYDROcodone-acetaminophen (NORCO/VICODIN) 5-325 MG tablet Take 0.5-1 tablets by mouth 2 (two) times daily as needed for moderate pain.  [name] insulin glargine (LANTUS) 100 UNIT/ML injection Inject 0.35 mLs (35 Units total) into the skin at bedtime.  . Insulin Syringe-Needle U-100 (B-D INS SYR ULTRAFINE 1CC/30G) 30G X 1/2" 1 ML MISC 1 Syringe by Does not apply route as directed.  . Insulin Syringe-Needle U-100 (B-D INS SYRINGE 0.5CC/30GX1/2") 30G X 1/2" 0.5 ML MISC Use as directed to inject insulin. Dx: E11.65  . lisinopril (PRINIVIL,ZESTRIL) 40 MG tablet Take 1 tablet (40 mg total) by mouth daily.  . metFORMIN (GLUCOPHAGE) 1000 MG tablet Take 1 tablet (1,000 mg total) by mouth 2 (two) times daily with a meal.  . pantoprazole (PROTONIX) 20 MG tablet Take 1 tablet (20 mg total) by mouth daily.  . potassium chloride SA (K-DUR,KLOR-CON) 20 MEQ tablet Take 20 mEq by mouth daily.   . saw palmetto 160 MG capsule Take 450 mg by mouth daily.   [name]  traZODone (DESYREL) 50 MG tablet Take 2 tablets (100 mg total) by mouth at bedtime.  . vitamin C (ASCORBIC ACID) 500 MG tablet Take 500 mg by mouth daily.   No current facility-administered medications on file prior to visit.     Review of Systems Per HPI unless specifically indicated in ROS section     Objective:    BP (!) 146/86   Pulse (!) 36 Comment: manual check  Temp 97.7 F (36.5 C) (Oral)   Wt 205 lb 8 oz (93.2 kg)   SpO2 95%   BMI 30.79 kg/m   Wt Readings from Last 3 Encounters:  10/28/16 205 lb 8 oz (93.2 kg)  06/11/16 193 lb 4 oz  (87.7 kg)  06/08/16 195 lb 8 oz (88.7 kg)    Physical Exam  Constitutional: He appears well-developed and well-nourished. No distress.  HENT:  Head: Normocephalic and atraumatic.  Right Ear: External ear normal.  Left Ear: External ear normal.  Nose: Nose normal.  Mouth/Throat: Oropharynx is clear and moist. No oropharyngeal exudate.  Eyes: Conjunctivae and EOM are normal. Pupils are equal, round, and reactive to light. No scleral icterus.  Neck: Normal range of motion. Neck supple.  Cardiovascular: Normal rate, regular rhythm and intact distal pulses.   Murmur (3/6 SEM) heard. Pulmonary/Chest: Effort normal and breath sounds normal. No respiratory distress. He has no wheezes. He has no rales.  Musculoskeletal: He exhibits edema (1+ pedal).  See HPI for foot exam if done  Lymphadenopathy:    He has no cervical adenopathy.  Skin: Skin is warm and dry. No rash noted.  Psychiatric: He has a normal mood and affect.  Nursing note and vitals reviewed.  Lab Results  Component Value Date   CREATININE 1.08 10/28/2016    Lab Results  Component Value Date   HGBA1C 8.4 (H) 10/28/2016    EKG - marked bradycardia 36 with alternating nonconductive atrial flutter 4 or 5:1, ST changes anteriolaterally.     Assessment & Plan:   Problem List Items Addressed This Visit    Aortic stenosis    Will try and expedite cards f/u. Referral placed.      Relevant Medications   furosemide (LASIX) 40 MG tablet   metoprolol (LOPRESSOR) 50 MG tablet   apixaban (ELIQUIS) 5 MG TABS tablet   Other Relevant Orders   TSH (Completed)   Troponin I (Completed)   Ambulatory referral to Cardiology   Atrial flutter ([place])    ADDENDUM ==> see EKG - atrial flutter with 4:1 or 5:1 conduction. Touched base with cardiology Dr Sallyanne Kuster.  Will start eliquis, schedule appt with cards APP on Monday and possible TEE cardioversion early next week.  Discussed with patient, discussed to ER if worsening over weekend.         Relevant Medications   furosemide (LASIX) 40 MG tablet   metoprolol (LOPRESSOR) 50 MG tablet   apixaban (ELIQUIS) 5 MG TABS tablet   Bradycardia    Decrease metoprolol to 25mg  BID.  Check EKG today.      Relevant Orders   EKG 12-Lead (Completed)   TSH (Completed)   Troponin I (Completed)   Ambulatory referral to Cardiology   Chronic radicular pain of lower back    Improved after 2nd ESI ([place])      Diabetes mellitus type 2, uncontrolled, with complications ([place])    Update A1c. Pt endorses fasting readings 140s      Relevant Orders   Hemoglobin A1c (Completed)   Orthopnea -  Primary    ?progression of mod-severe AS. Expedite cards referral. With weight gain noted, endorsed pedal edema and orthopnea, will increase lasix to 80mg  daily x 1 wk.       Relevant Orders   Basic metabolic panel (Completed)   CBC with Differential/Platelet (Completed)   Brain natriuretic peptide (Completed)   EKG 12-Lead (Completed)   TSH (Completed)   Troponin I (Completed)   Ambulatory referral to Cardiology       Follow up plan: Return if symptoms worsen or fail to improve.  [name], MD

## 2016-10-28 NOTE — Progress Notes (Signed)
Pre visit review using our clinic review tool, if applicable. No additional management support is needed unless otherwise documented below in the visit note. 

## 2016-10-28 NOTE — Addendum Note (Signed)
Addended by: [name] on: 10/28/2016 02:20 PM   Modules accepted: Level of Service

## 2016-10-28 NOTE — Telephone Encounter (Signed)
Talked to Dr. [name] about Charles [name] by phone. He presented today with 10 pound weight gain, dyspnea and fatigue in atrial flutter with slow ventricular response. He previously underwent cardioversion for a similar rhythm abnormality in September 2016. He is no longer taking anticoagulants due to personal preference. I agree with Dr. Bosie Clos plan to lower his beta blocker dose and to increase his diuretic dose. Charles [name] was instructed to go to the emergency room if his symptoms worsen overnight. We scheduled him to see Dr. [name] in the clinic tomorrow for further evaluation, but I went ahead and tentatively scheduled Charles [name] for a TEE-guided cardioversion next Tuesday with Dr. Oval Linsey. In the meantime he should start treatment with anticoagulants and discontinue aspirin.  Sanda Klein, MD, [facility] [phone] office [phone] pager

## 2016-10-28 NOTE — Assessment & Plan Note (Signed)
Decrease metoprolol to 25mg  BID.  Check EKG today.

## 2016-10-28 NOTE — Assessment & Plan Note (Signed)
Will try and expedite cards f/u. Referral placed.

## 2016-10-28 NOTE — Assessment & Plan Note (Addendum)
ADDENDUM ==> see EKG - atrial flutter with 4:1 or 5:1 conduction. Touched base with cardiology Dr Sallyanne Kuster.  Will start eliquis, schedule appt with cards APP on Monday and possible TEE cardioversion early next week.  Discussed with patient, discussed to ER if worsening over weekend.

## 2016-10-28 NOTE — Assessment & Plan Note (Signed)
Update A1c. Pt endorses fasting readings 140s

## 2016-10-29 ENCOUNTER — Ambulatory Visit (INDEPENDENT_AMBULATORY_CARE_PROVIDER_SITE_OTHER): Payer: Medicare Other | Admitting: Cardiovascular Disease

## 2016-10-29 ENCOUNTER — Encounter: Payer: Self-pay | Admitting: Cardiovascular Disease

## 2016-10-29 ENCOUNTER — Other Ambulatory Visit: Payer: Self-pay | Admitting: Cardiovascular Disease

## 2016-10-29 DIAGNOSIS — I701 Atherosclerosis of renal artery: Secondary | ICD-10-CM

## 2016-10-29 DIAGNOSIS — I483 Typical atrial flutter: Secondary | ICD-10-CM | POA: Diagnosis not present

## 2016-10-29 DIAGNOSIS — I10 Essential (primary) hypertension: Secondary | ICD-10-CM

## 2016-10-29 DIAGNOSIS — I2510 Atherosclerotic heart disease of native coronary artery without angina pectoris: Secondary | ICD-10-CM

## 2016-10-29 DIAGNOSIS — I350 Nonrheumatic aortic (valve) stenosis: Secondary | ICD-10-CM

## 2016-10-29 NOTE — Progress Notes (Signed)
10/29/2016 ONYX KINNARD   1932/05/04  [id number]  Primary Physician [name], MD Primary Cardiologist: [name] MD [name]  HPI:   The patient is a delightful, 83 year old, mildly overweight, married Caucasian male whose wife was also a patient of mine. She unfortunately passed away Jul 22, 2015. He currently lives with one of his sons and appears to be fairly depressed. He was married for 51 years.He is a father of 75, grandfather to 4 grandchildren. I last saw him in the office 06/02/16. He is accompanied by his son [name] today.[name] He has a history of CAD status post coronary artery bypass grafting x4 June 2004 with a LIMA to his LAD; a vein to the right coronary artery, obtuse marginal branch and ramus branch. He also was found to have 70% bilateral renal artery stenosis which we have been following by duplex ultrasound. He has had left carotid endarterectomy which we follow as well. His other problems include hypertension, hyperlipidemia and noninsulin-requiring diabetes. He is asymptomatic. His last functional study performed 2/14 was nonischemic. he denies chest pain or shortness of breath. He had new onset atrial flutter back in the fall and underwent TEE guided DC cardioversion by Dr. Debara Pickett 06/25/15 successfully to sinus rhythm. He was placed on Eliquis oral anticoagulation but has ultimately decided not to take this for various reasons.recent renal Dopplers have suggested progression of disease bilaterally left greater than right. His blood pressures have been more difficult to control as well. In addition, his echo performed 06/13/15 was significant for moderate to severe aortic stenosis with a valve area of 0.9 cm2 and a peak gradient of 49 mmHg. since I saw him in the office 5 months ago he's noticed increasing shortness of breath and fatigue over the last 3 weeks. He saw his PCP yesterday who did an EKG demonstrating recurrent atrial flutter with slow ventricular  response. The plan is to perform TEE guided DC cardioversion on Tuesday. We will start him on oral anticoagulation tonight.    Current Outpatient Prescriptions  Medication Sig Dispense Refill  . acetaminophen (TYLENOL) 500 MG tablet Take 1 tablet (500 mg total) by mouth 3 (three) times daily.    [name] atorvastatin (LIPITOR) 20 MG tablet TAKE 1 TABLET DAILY. 90 tablet 1  . BAYER MICROLET LANCETS lancets Ck blood sugar three times daily and as directed. Dx 250.00. Insulin dependent 100 each 11  . citalopram (CELEXA) 20 MG tablet Take 1 tablet (20 mg total) by mouth daily. 30 tablet 6  . Cyanocobalamin (B-12) 500 MCG TABS Take 1 tablet by mouth daily.    . furosemide (LASIX) 40 MG tablet Take 2 tablets (80 mg total) by mouth daily. 60 tablet 1  . gabapentin (NEURONTIN) 100 MG capsule Take 1 capsule (100 mg total) by mouth 2 (two) times daily as needed. 60 capsule 3  . glucose blood (BAYER CONTOUR NEXT TEST) test strip Ck blood sugar three times daily and as directed. Dx 250.00 insulin dependent 100 each 11  . HYDROcodone-acetaminophen (NORCO/VICODIN) 5-325 MG tablet Take 0.5-1 tablets by mouth 2 (two) times daily as needed for moderate pain. 60 tablet 0  . insulin glargine (LANTUS) 100 UNIT/ML injection Inject 0.35 mLs (35 Units total) into the skin at bedtime. 10 mL 3  . Insulin Syringe-Needle U-100 (B-D INS SYR ULTRAFINE 1CC/30G) 30G X 1/2" 1 ML MISC 1 Syringe by Does not apply route as directed. 100 each 3  . Insulin Syringe-Needle U-100 (B-D INS SYRINGE 0.5CC/30GX1/2")  30G X 1/2" 0.5 ML MISC Use as directed to inject insulin. Dx: E11.65 100 each 3  . lisinopril (PRINIVIL,ZESTRIL) 40 MG tablet Take 1 tablet (40 mg total) by mouth daily. 90 tablet 1  . metFORMIN (GLUCOPHAGE) 1000 MG tablet Take 1 tablet (1,000 mg total) by mouth 2 (two) times daily with a meal. 180 tablet 1  . metoprolol (LOPRESSOR) 50 MG tablet Take 0.5 tablets (25 mg total) by mouth 2 (two) times daily.    . pantoprazole  (PROTONIX) 20 MG tablet Take 1 tablet (20 mg total) by mouth daily. 30 tablet 6  . potassium chloride SA (K-DUR,KLOR-CON) 20 MEQ tablet Take 20 mEq by mouth daily.     . saw palmetto 160 MG capsule Take 450 mg by mouth daily.     . traZODone (DESYREL) 50 MG tablet Take 2 tablets (100 mg total) by mouth at bedtime. 180 tablet 0  . vitamin C (ASCORBIC ACID) 500 MG tablet Take 500 mg by mouth daily.    [name] apixaban (ELIQUIS) 5 MG TABS tablet Take 1 tablet (5 mg total) by mouth 2 (two) times daily. (Patient not taking: Reported on 10/29/2016) 30 tablet 0   No current facility-administered medications for this visit.     Allergies  Allergen Reactions  . Amlodipine Other (See Comments)    Edema  . Other Other (See Comments)    myalgias Horse serum Horse serum  . Rosiglitazone Maleate     REACTION: Did not help  . Tricor [Fenofibrate] Other (See Comments)    myalgias    Social History   Social History  . Marital status: Married    Spouse name: N/A  . Number of children: N/A  . Years of education: N/A   Occupational History  . Not on file.   Social History Main Topics  . Smoking status: Former Smoker    Packs/day: 0.50    Years: 36.00    Types: Cigarettes    Quit date: 07/23/2013  . Smokeless tobacco: Never Used  . Alcohol use No  . Drug use: No  . Sexual activity: Not Currently   Other Topics Concern  . Not on file   Social History Narrative   Caffeine: 4 cups coffee/day, some tea and soda   Lives with wife   Occupation: Retired   Activity: no regular exercise   Diet: good water, fruits/vegetables daily     Review of Systems: General: negative for chills, fever, night sweats or weight changes.  Cardiovascular: negative for chest pain, dyspnea on exertion, edema, orthopnea, palpitations, paroxysmal nocturnal dyspnea or shortness of breath Dermatological: negative for rash Respiratory: negative for cough or wheezing Urologic: negative for hematuria Abdominal:  negative for nausea, vomiting, diarrhea, bright red blood per rectum, melena, or hematemesis Neurologic: negative for visual changes, syncope, or dizziness All other systems reviewed and are otherwise negative except as noted above.    Blood pressure 138/70, pulse (!) 48, height 6\' 1"  (1.854 m), weight 206 lb (93.4 kg).  General appearance: alert and no distress Neck: no adenopathy, no carotid bruit, no JVD, supple, symmetrical, trachea midline and thyroid not enlarged, symmetric, no tenderness/mass/nodules Lungs: clear to auscultation bilaterally Heart: irregularly irregular rhythm and Soft outflow tract murmur consistent with aortic stenosis Extremities: Trace ankle edema  EKG not performed today  ASSESSMENT AND PLAN:   Essential hypertension History of hypertension blood pressure measured at 138/70. He is on metoprolol. Continue current meds at current dosing.  Coronary atherosclerosis History of CAD status post  coronary artery bypass grafting X 4 in June 2004 with a LIMA to his LAD, vein to the right coronary artery, obtuse marginal branch and ramus branch. His last Myoview performed 2/14 was nonischemic. He denies chest pain.  Renal artery stenosis History of bilateral renal artery stenosis by duplex ultrasound. I did perform angiography on him 02/02/16 revealing 30-40% proximal left and diffuse 60% proximal right renal artery stenosis. It appears that the duplex ultrasound over estimate of the degree of stenosis.  Atrial flutter ([facility]) History of atrial flutter status post TEE guided DC cardioversion by Dr. Debara Pickett 06/25/15 successfully to sinus rhythm. He was placed on Eliquis oral anticoagulation that time but decided to stop this because for financial reasons. He has been symptomatic for the last 2-3 weeks with fatigue and dyspnea. EKG performed at his PCPs office yesterday showed atrial flutter with a slow ventricular response in the 30s. We will restart him on oral anticoagulant  tonight. He is scheduled to have TEE guided DC cardioversion by Dr. Sallyanne Kuster on Tuesday.  Aortic stenosis History of aortic stenosis demonstrated by 2-D echo 06/13/15 with a valve area of 0.9 cm and a peak gradient of 49 mmHg.      [name] MD FACP,FACC,FAHA, [facility] 10/29/2016 4:29 PM

## 2016-10-29 NOTE — Assessment & Plan Note (Signed)
History of aortic stenosis demonstrated by 2-D echo 06/13/15 with a valve area of 0.9 cm and a peak gradient of 49 mmHg.

## 2016-10-29 NOTE — Assessment & Plan Note (Signed)
History of bilateral renal artery stenosis by duplex ultrasound. I did perform angiography on him 02/02/16 revealing 30-40% proximal left and diffuse 60% proximal right renal artery stenosis. It appears that the duplex ultrasound over estimate of the degree of stenosis.

## 2016-10-29 NOTE — Assessment & Plan Note (Signed)
History of CAD status post coronary artery bypass grafting X 4 in June 2004 with a LIMA to his LAD, vein to the right coronary artery, obtuse marginal branch and ramus branch. His last Myoview performed 2/14 was nonischemic. He denies chest pain.

## 2016-10-29 NOTE — Patient Instructions (Signed)
Your physician has recommended that you have a Cardioversion (DCCV) on 11/02/16 with Dr. Oval Linsey. Electrical Cardioversion uses a jolt of electricity to your heart either through paddles or wired patches attached to your chest. This is a controlled, usually prescheduled, procedure. Defibrillation is done under light anesthesia in the hospital, and you usually go home the day of the procedure. This is done to get your heart back into a normal rhythm. You are not awake for the procedure. Please see the instruction sheet given to you today.  A chest x-ray takes a picture of the organs and structures inside the chest, including the heart, lungs, and blood vessels. This test can show several things, including, whether the heart is enlarges; whether fluid is building up in the lungs; and whether pacemaker / defibrillator leads are still in place.

## 2016-10-29 NOTE — Assessment & Plan Note (Signed)
History of hypertension blood pressure measured at 138/70. He is on metoprolol. Continue current meds at current dosing.

## 2016-10-29 NOTE — Assessment & Plan Note (Signed)
History of atrial flutter status post TEE guided DC cardioversion by Dr. Debara Pickett 06/25/15 successfully to sinus rhythm. He was placed on Eliquis oral anticoagulation that time but decided to stop this because for financial reasons. He has been symptomatic for the last 2-3 weeks with fatigue and dyspnea. EKG performed at his PCPs office yesterday showed atrial flutter with a slow ventricular response in the 30s. We will restart him on oral anticoagulant tonight. He is scheduled to have TEE guided DC cardioversion by Dr. Sallyanne Kuster on Tuesday.

## 2016-11-01 ENCOUNTER — Ambulatory Visit
Admission: RE | Admit: 2016-11-01 | Discharge: 2016-11-01 | Disposition: A | Discharge status: Home / Self Care | Payer: Medicare Other | Source: Ambulatory Visit | Attending: Cardiovascular Disease | Admitting: Cardiovascular Disease

## 2016-11-01 DIAGNOSIS — Z951 Presence of aortocoronary bypass graft: Secondary | ICD-10-CM | POA: Diagnosis not present

## 2016-11-01 DIAGNOSIS — I350 Nonrheumatic aortic (valve) stenosis: Secondary | ICD-10-CM

## 2016-11-02 ENCOUNTER — Encounter (HOSPITAL_COMMUNITY)
Admission: RE | Disposition: A | Discharge status: Home / Self Care | Payer: Self-pay | Source: Ambulatory Visit | Attending: Cardiovascular Disease

## 2016-11-02 ENCOUNTER — Encounter (HOSPITAL_COMMUNITY): Payer: Self-pay | Admitting: Cardiovascular Disease

## 2016-11-02 ENCOUNTER — Ambulatory Visit (HOSPITAL_COMMUNITY): Payer: Medicare Other | Admitting: Anesthesiology

## 2016-11-02 ENCOUNTER — Ambulatory Visit (HOSPITAL_BASED_OUTPATIENT_CLINIC_OR_DEPARTMENT_OTHER): Payer: Medicare Other

## 2016-11-02 ENCOUNTER — Ambulatory Visit (HOSPITAL_COMMUNITY)
Admission: RE | Admit: 2016-11-02 | Discharge: 2016-11-02 | Disposition: A | Discharge status: Home / Self Care | Payer: Medicare Other | Source: Ambulatory Visit | Attending: Cardiovascular Disease | Admitting: Cardiovascular Disease

## 2016-11-02 DIAGNOSIS — Z79899 Other long term (current) drug therapy: Secondary | ICD-10-CM | POA: Insufficient documentation

## 2016-11-02 DIAGNOSIS — E1151 Type 2 diabetes mellitus with diabetic peripheral angiopathy without gangrene: Secondary | ICD-10-CM | POA: Insufficient documentation

## 2016-11-02 DIAGNOSIS — Z7901 Long term (current) use of anticoagulants: Secondary | ICD-10-CM | POA: Insufficient documentation

## 2016-11-02 DIAGNOSIS — I509 Heart failure, unspecified: Secondary | ICD-10-CM | POA: Diagnosis not present

## 2016-11-02 DIAGNOSIS — I110 Hypertensive heart disease with heart failure: Secondary | ICD-10-CM | POA: Diagnosis not present

## 2016-11-02 DIAGNOSIS — K219 Gastro-esophageal reflux disease without esophagitis: Secondary | ICD-10-CM | POA: Diagnosis not present

## 2016-11-02 DIAGNOSIS — Z951 Presence of aortocoronary bypass graft: Secondary | ICD-10-CM | POA: Insufficient documentation

## 2016-11-02 DIAGNOSIS — Z87891 Personal history of nicotine dependence: Secondary | ICD-10-CM | POA: Diagnosis not present

## 2016-11-02 DIAGNOSIS — I350 Nonrheumatic aortic (valve) stenosis: Secondary | ICD-10-CM | POA: Diagnosis not present

## 2016-11-02 DIAGNOSIS — E663 Overweight: Secondary | ICD-10-CM | POA: Insufficient documentation

## 2016-11-02 DIAGNOSIS — I483 Typical atrial flutter: Secondary | ICD-10-CM

## 2016-11-02 DIAGNOSIS — I083 Combined rheumatic disorders of mitral, aortic and tricuspid valves: Secondary | ICD-10-CM | POA: Insufficient documentation

## 2016-11-02 DIAGNOSIS — F329 Major depressive disorder, single episode, unspecified: Secondary | ICD-10-CM | POA: Insufficient documentation

## 2016-11-02 DIAGNOSIS — I4891 Unspecified atrial fibrillation: Secondary | ICD-10-CM | POA: Diagnosis not present

## 2016-11-02 DIAGNOSIS — I2510 Atherosclerotic heart disease of native coronary artery without angina pectoris: Secondary | ICD-10-CM | POA: Insufficient documentation

## 2016-11-02 DIAGNOSIS — Z794 Long term (current) use of insulin: Secondary | ICD-10-CM | POA: Diagnosis not present

## 2016-11-02 DIAGNOSIS — E785 Hyperlipidemia, unspecified: Secondary | ICD-10-CM | POA: Insufficient documentation

## 2016-11-02 DIAGNOSIS — M5136 Other intervertebral disc degeneration, lumbar region: Secondary | ICD-10-CM | POA: Diagnosis not present

## 2016-11-02 DIAGNOSIS — I4892 Unspecified atrial flutter: Secondary | ICD-10-CM | POA: Insufficient documentation

## 2016-11-02 DIAGNOSIS — Z6827 Body mass index (BMI) 27.0-27.9, adult: Secondary | ICD-10-CM | POA: Diagnosis not present

## 2016-11-02 DIAGNOSIS — E119 Type 2 diabetes mellitus without complications: Secondary | ICD-10-CM | POA: Diagnosis not present

## 2016-11-02 HISTORY — PX: TEE WITHOUT CARDIOVERSION: SHX5443

## 2016-11-02 HISTORY — PX: CARDIOVERSION: SHX1299

## 2016-11-02 LAB — POCT I-STAT 4, (NA,K, GLUC, HGB,HCT)
Glucose, Bld: 126 mg/dL — ABNORMAL HIGH (ref 65–99)
HEMATOCRIT: 40 % (ref 39.0–52.0)
Hemoglobin: 13.6 g/dL (ref 13.0–17.0)
Potassium: 4 mmol/L (ref 3.5–5.1)
Sodium: 141 mmol/L (ref 135–145)

## 2016-11-02 SURGERY — ECHOCARDIOGRAM, TRANSESOPHAGEAL
Anesthesia: General

## 2016-11-02 MED ORDER — LIDOCAINE HCL (CARDIAC) 20 MG/ML IV SOLN
INTRAVENOUS | Status: DC | PRN
  Administered 2016-11-02: 80 mg via INTRAVENOUS

## 2016-11-02 MED ORDER — EPHEDRINE SULFATE 50 MG/ML IJ SOLN
INTRAMUSCULAR | Status: DC | PRN
  Administered 2016-11-02: 15 mg via INTRAVENOUS

## 2016-11-02 MED ORDER — SODIUM CHLORIDE 0.9 % IV SOLN: INTRAVENOUS | Status: DC

## 2016-11-02 MED ORDER — PROPOFOL 10 MG/ML IV BOLUS
INTRAVENOUS | Status: DC | PRN
  Administered 2016-11-02 (×3): 10 mg via INTRAVENOUS

## 2016-11-02 MED ORDER — LACTATED RINGERS IV SOLN
INTRAVENOUS | Status: DC
  Administered 2016-11-02: 11:00:00 via INTRAVENOUS

## 2016-11-02 MED ORDER — PROPOFOL 500 MG/50ML IV EMUL
INTRAVENOUS | Status: DC | PRN
  Administered 2016-11-02: 100 ug/kg/min via INTRAVENOUS

## 2016-11-02 MED ORDER — FUROSEMIDE 40 MG PO TABS: 80.0000 mg | ORAL_TABLET | Freq: Two times a day (BID) | ORAL | 1 refills | Status: DC

## 2016-11-02 NOTE — Interval H&P Note (Signed)
History and Physical Interval Note:  11/02/2016 2:14 PM  Charles [name]  has presented today for surgery, with the diagnosis of AFIB  The various methods of treatment have been discussed with the patient and family. After consideration of risks, benefits and other options for treatment, the patient has consented to  Procedure(s): TRANSESOPHAGEAL ECHOCARDIOGRAM (TEE) (N/A) CARDIOVERSION (N/A) as a surgical intervention .  The patient's history has been reviewed, patient examined, no change in status, stable for surgery.  I have reviewed the patient's chart and labs.  Questions were answered to the patient's satisfaction.     [name], MD 11/02/2016  2:14 PM

## 2016-11-02 NOTE — Interval H&P Note (Signed)
History and Physical Interval Note:  11/02/2016 12:32 PM  Charles [name]  has presented today for surgery, with the diagnosis of AFIB  The various methods of treatment have been discussed with the patient and family. After consideration of risks, benefits and other options for treatment, the patient has consented to  Procedure(s): TRANSESOPHAGEAL ECHOCARDIOGRAM (TEE) (N/A) CARDIOVERSION (N/A) as a surgical intervention .  The patient's history has been reviewed, patient examined, no change in status, stable for surgery.  I have reviewed the patient's chart and labs.  Questions were answered to the patient's satisfaction.     [name], MD 11/02/2016  12:32 PM

## 2016-11-02 NOTE — Anesthesia Postprocedure Evaluation (Addendum)
Anesthesia Post Note  Patient: JANSSEN PULS  Procedure(s) Performed: Procedure(s) (LRB): TRANSESOPHAGEAL ECHOCARDIOGRAM (TEE) (N/A) CARDIOVERSION (N/A)  Patient location during evaluation: PACU Anesthesia Type: General Level of consciousness: awake and alert Pain management: pain level controlled Vital Signs Assessment: post-procedure vital signs reviewed and stable Respiratory status: spontaneous breathing, nonlabored ventilation, respiratory function stable and patient connected to nasal cannula oxygen Cardiovascular status: blood pressure returned to baseline and stable Postop Assessment: no signs of nausea or vomiting Anesthetic complications: no       Last Vitals:  Vitals:   11/02/16 1448 11/02/16 1450  BP: (!) 158/74 (!) 153/75  Pulse: 63   Resp: (!) 24   Temp: 36.4 C     Last Pain:  Vitals:   11/02/16 1448  TempSrc: Oral                 Effie Berkshire

## 2016-11-02 NOTE — CV Procedure (Signed)
Brief TEE Note  LVEF 30-35% Moderate aortic stenosis.  Mild aortic regurgitation. Moderate tricuspid regurgitation. Mild mitral regurgitation Moderate RV systolic dysfunction No LA/LAA thrombus or mass.  For additional details see full report.    Electrical Cardioversion Procedure Note Charles [name] [id number] 1932-03-05  Procedure: Electrical Cardioversion Indications:  Atrial Flutter  Procedure Details Consent: Risks of procedure as well as the alternatives and risks of each were explained to the (patient/caregiver).  Consent for procedure obtained. Time Out: Verified patient identification, verified procedure, site/side was marked, verified correct patient position, special equipment/implants available, medications/allergies/relevent history reviewed, required imaging and test results available.  Performed  Patient placed on cardiac monitor, pulse oximetry, supplemental oxygen as necessary.  Sedation given: Propofol Pacer pads placed anterior and posterior chest.  Cardioverted 1 time(s).  Cardioverted at 150J.  Evaluation Findings: Post procedure EKG shows: NSR Complications: None Patient did tolerate procedure well.   Charles Latch, MD 11/02/2016, 3:07 PM

## 2016-11-02 NOTE — H&P (View-Only) (Signed)
10/29/2016 Charles [name]   04/18/1932  [id number]  Primary Physician [name], MD Primary Cardiologist: [name] MD [name]  HPI:   The patient is a delightful, 83 year old, mildly overweight, married Caucasian male whose wife was also a patient of mine. She unfortunately passed away 08/05/15. He currently lives with one of his sons and appears to be fairly depressed. He was married for 51 years.He is a father of 53, grandfather to 4 grandchildren. I last saw him in the office 06/02/16. He is accompanied by his son Charles [name] today.[name] He has a history of CAD status post coronary artery bypass grafting x4 June 2004 with a LIMA to his LAD; a vein to the right coronary artery, obtuse marginal branch and ramus branch. He also was found to have 70% bilateral renal artery stenosis which we have been following by duplex ultrasound. He has had left carotid endarterectomy which we follow as well. His other problems include hypertension, hyperlipidemia and noninsulin-requiring diabetes. He is asymptomatic. His last functional study performed 2/14 was nonischemic. he denies chest pain or shortness of breath. He had new onset atrial flutter back in the fall and underwent TEE guided DC cardioversion by Dr. Debara Pickett 06/25/15 successfully to sinus rhythm. He was placed on Eliquis oral anticoagulation but has ultimately decided not to take this for various reasons.recent renal Dopplers have suggested progression of disease bilaterally left greater than right. His blood pressures have been more difficult to control as well. In addition, his echo performed 06/13/15 was significant for moderate to severe aortic stenosis with a valve area of 0.9 cm2 and a peak gradient of 49 mmHg. since I saw him in the office 5 months ago he's noticed increasing shortness of breath and fatigue over the last 3 weeks. He saw his PCP yesterday who did an EKG demonstrating recurrent atrial flutter with slow ventricular  response. The plan is to perform TEE guided DC cardioversion on Tuesday. We will start him on oral anticoagulation tonight.    Current Outpatient Prescriptions  Medication Sig Dispense Refill  . acetaminophen (TYLENOL) 500 MG tablet Take 1 tablet (500 mg total) by mouth 3 (three) times daily.    [name] atorvastatin (LIPITOR) 20 MG tablet TAKE 1 TABLET DAILY. 90 tablet 1  . BAYER MICROLET LANCETS lancets Ck blood sugar three times daily and as directed. Dx 250.00. Insulin dependent 100 each 11  . citalopram (CELEXA) 20 MG tablet Take 1 tablet (20 mg total) by mouth daily. 30 tablet 6  . Cyanocobalamin (B-12) 500 MCG TABS Take 1 tablet by mouth daily.    . furosemide (LASIX) 40 MG tablet Take 2 tablets (80 mg total) by mouth daily. 60 tablet 1  . gabapentin (NEURONTIN) 100 MG capsule Take 1 capsule (100 mg total) by mouth 2 (two) times daily as needed. 60 capsule 3  . glucose blood (BAYER CONTOUR NEXT TEST) test strip Ck blood sugar three times daily and as directed. Dx 250.00 insulin dependent 100 each 11  . HYDROcodone-acetaminophen (NORCO/VICODIN) 5-325 MG tablet Take 0.5-1 tablets by mouth 2 (two) times daily as needed for moderate pain. 60 tablet 0  . insulin glargine (LANTUS) 100 UNIT/ML injection Inject 0.35 mLs (35 Units total) into the skin at bedtime. 10 mL 3  . Insulin Syringe-Needle U-100 (B-D INS SYR ULTRAFINE 1CC/30G) 30G X 1/2" 1 ML MISC 1 Syringe by Does not apply route as directed. 100 each 3  . Insulin Syringe-Needle U-100 (B-D INS SYRINGE 0.5CC/30GX1/2")  30G X 1/2" 0.5 ML MISC Use as directed to inject insulin. Dx: E11.65 100 each 3  . lisinopril (PRINIVIL,ZESTRIL) 40 MG tablet Take 1 tablet (40 mg total) by mouth daily. 90 tablet 1  . metFORMIN (GLUCOPHAGE) 1000 MG tablet Take 1 tablet (1,000 mg total) by mouth 2 (two) times daily with a meal. 180 tablet 1  . metoprolol (LOPRESSOR) 50 MG tablet Take 0.5 tablets (25 mg total) by mouth 2 (two) times daily.    . pantoprazole  (PROTONIX) 20 MG tablet Take 1 tablet (20 mg total) by mouth daily. 30 tablet 6  . potassium chloride SA (K-DUR,KLOR-CON) 20 MEQ tablet Take 20 mEq by mouth daily.     . saw palmetto 160 MG capsule Take 450 mg by mouth daily.     . traZODone (DESYREL) 50 MG tablet Take 2 tablets (100 mg total) by mouth at bedtime. 180 tablet 0  . vitamin C (ASCORBIC ACID) 500 MG tablet Take 500 mg by mouth daily.    [name] apixaban (ELIQUIS) 5 MG TABS tablet Take 1 tablet (5 mg total) by mouth 2 (two) times daily. (Patient not taking: Reported on 10/29/2016) 30 tablet 0   No current facility-administered medications for this visit.     Allergies  Allergen Reactions  . Amlodipine Other (See Comments)    Edema  . Other Other (See Comments)    myalgias Horse serum Horse serum  . Rosiglitazone Maleate     REACTION: Did not help  . Tricor [Fenofibrate] Other (See Comments)    myalgias    Social History   Social History  . Marital status: Married    Spouse name: N/A  . Number of children: N/A  . Years of education: N/A   Occupational History  . Not on file.   Social History Main Topics  . Smoking status: Former Smoker    Packs/day: 0.50    Years: 36.00    Types: Cigarettes    Quit date: 07/23/2013  . Smokeless tobacco: Never Used  . Alcohol use No  . Drug use: No  . Sexual activity: Not Currently   Other Topics Concern  . Not on file   Social History Narrative   Caffeine: 4 cups coffee/day, some tea and soda   Lives with wife   Occupation: Retired   Activity: no regular exercise   Diet: good water, fruits/vegetables daily     Review of Systems: General: negative for chills, fever, night sweats or weight changes.  Cardiovascular: negative for chest pain, dyspnea on exertion, edema, orthopnea, palpitations, paroxysmal nocturnal dyspnea or shortness of breath Dermatological: negative for rash Respiratory: negative for cough or wheezing Urologic: negative for hematuria Abdominal:  negative for nausea, vomiting, diarrhea, bright red blood per rectum, melena, or hematemesis Neurologic: negative for visual changes, syncope, or dizziness All other systems reviewed and are otherwise negative except as noted above.    Blood pressure 138/70, pulse (!) 48, height 6\' 1"  (1.854 m), weight 206 lb (93.4 kg).  General appearance: alert and no distress Neck: no adenopathy, no carotid bruit, no JVD, supple, symmetrical, trachea midline and thyroid not enlarged, symmetric, no tenderness/mass/nodules Lungs: clear to auscultation bilaterally Heart: irregularly irregular rhythm and Soft outflow tract murmur consistent with aortic stenosis Extremities: Trace ankle edema  EKG not performed today  ASSESSMENT AND PLAN:   Essential hypertension History of hypertension blood pressure measured at 138/70. He is on metoprolol. Continue current meds at current dosing.  Coronary atherosclerosis History of CAD status post  coronary artery bypass grafting X 4 in June 2004 with a LIMA to his LAD, vein to the right coronary artery, obtuse marginal branch and ramus branch. His last Myoview performed 2/14 was nonischemic. He denies chest pain.  Renal artery stenosis History of bilateral renal artery stenosis by duplex ultrasound. I did perform angiography on him 02/02/16 revealing 30-40% proximal left and diffuse 60% proximal right renal artery stenosis. It appears that the duplex ultrasound over estimate of the degree of stenosis.  Atrial flutter (HCC) History of atrial flutter status post TEE guided DC cardioversion by Dr. Debara Pickett 06/25/15 successfully to sinus rhythm. He was placed on Eliquis oral anticoagulation that time but decided to stop this because for financial reasons. He has been symptomatic for the last 2-3 weeks with fatigue and dyspnea. EKG performed at his PCPs office yesterday showed atrial flutter with a slow ventricular response in the 30s. We will restart him on oral anticoagulant  tonight. He is scheduled to have TEE guided DC cardioversion by Dr. Sallyanne Kuster on Tuesday.  Aortic stenosis History of aortic stenosis demonstrated by 2-D echo 06/13/15 with a valve area of 0.9 cm and a peak gradient of 49 mmHg.      [name] MD FACP,FACC,FAHA, [facility] 10/29/2016 4:29 PM

## 2016-11-02 NOTE — Transfer of Care (Signed)
Immediate Anesthesia Transfer of Care Note  Patient: Charles [name]  Procedure(s) Performed: Procedure(s): TRANSESOPHAGEAL ECHOCARDIOGRAM (TEE) (N/A) CARDIOVERSION (N/A)  Patient Location: Endoscopy Unit  Anesthesia Type:MAC  Level of Consciousness: sedated and patient cooperative  Airway & Oxygen Therapy: Patient Spontanous Breathing and Patient connected to face mask oxygen  Post-op Assessment: Report given to RN and Post -op Vital signs reviewed and stable  Post vital signs: Reviewed and stable  Last Vitals:  Vitals:   11/02/16 1120  BP: (!) 205/69  Pulse: (!) 35  Resp: (!) 22  Temp: 36.6 C    Last Pain:  Vitals:   11/02/16 1120  TempSrc: Oral         Complications: No apparent anesthesia complications

## 2016-11-02 NOTE — Anesthesia Procedure Notes (Signed)
Procedure Name: MAC Date/Time: 11/02/2016 2:15 PM Performed by: Salli Quarry Versa Craton Pre-anesthesia Checklist: Patient identified, Emergency Drugs available, Suction available, Patient being monitored and Timeout performed Patient Re-evaluated:Patient Re-evaluated prior to inductionOxygen Delivery Method: Nasal cannula

## 2016-11-02 NOTE — Progress Notes (Signed)
Echocardiogram Echocardiogram Transesophageal has been performed.  Aggie Cosier 11/02/2016, 2:50 PM

## 2016-11-02 NOTE — Discharge Instructions (Signed)
TEE ° °YOU HAD AN CARDIAC PROCEDURE TODAY: Refer to the procedure report and other information in the discharge instructions given to you for any specific questions about what was found during the examination. If this information does not answer your questions, please call [facility] office at [phone] to clarify.  ° °DIET: Your first meal following the procedure should be a light meal and then it is ok to progress to your normal diet. A half-sandwich or bowl of soup is an example of a good first meal. Heavy or fried foods are harder to digest and may make you feel nauseous or bloated. Drink plenty of fluids but you should avoid alcoholic beverages for 24 hours. If you had a esophageal dilation, please see attached instructions for diet.  ° °ACTIVITY: Your care partner should take you home directly after the procedure. You should plan to take it easy, moving slowly for the rest of the day. You can resume normal activity the day after the procedure however YOU SHOULD NOT DRIVE, use power tools, machinery or perform tasks that involve climbing or major physical exertion for 24 hours (because of the sedation medicines used during the test).  ° °SYMPTOMS TO REPORT IMMEDIATELY: °A cardiologist can be reached at any hour. Please call [phone] for any of the following symptoms:  °Vomiting of blood or coffee ground material  °New, significant abdominal pain  °New, significant chest pain or pain under the shoulder blades  °Painful or persistently difficult swallowing  °New shortness of breath  °Black, tarry-looking or red, bloody stools ° °FOLLOW UP:  °Please also call with any specific questions about appointments or follow up tests. ° ° °Electrical Cardioversion, Care After °This sheet gives you information about how to care for yourself after your procedure. Your health care provider may also give you more specific instructions. If you have problems or questions, contact your health care provider. °What can I  expect after the procedure? °After the procedure, it is common to have: °· Some redness on the skin where the shocks were given. °Follow these instructions at home: °· Do not drive for 24 hours if you were given a medicine to help you relax (sedative). °· Take over-the-counter and prescription medicines only as told by your health care provider. °· Ask your health care provider how to check your pulse. Check it often. °· Rest for 48 hours after the procedure or as told by your health care provider. °· Avoid or limit your caffeine use as told by your health care provider. °Contact a health care provider if: °· You feel like your heart is beating too quickly or your pulse is not regular. °· You have a serious muscle cramp that does not go away. °Get help right away if: °· You have discomfort in your chest. °· You are dizzy or you feel faint. °· You have trouble breathing or you are short of breath. °· Your speech is slurred. °· You have trouble moving an arm or leg on one side of your body. °· Your fingers or toes turn cold or blue. °This information is not intended to replace advice given to you by your health care provider. Make sure you discuss any questions you have with your health care provider. °Document Released: 07/11/2013 Document Revised: 04/23/2016 Document Reviewed: 03/26/2016 °Elsevier Interactive Patient Education © 2017 [employer] ° °

## 2016-11-02 NOTE — Anesthesia Preprocedure Evaluation (Addendum)
Anesthesia Evaluation  Patient identified by MRN, date of birth, ID band Patient awake    Reviewed: Allergy & Precautions, NPO status , Patient's Chart, lab work & pertinent test results, reviewed documented beta blocker date and time   Airway Mallampati: I  TM Distance: >3 FB Neck ROM: Full    Dental  (+) Edentulous Upper, Edentulous Lower, Upper Dentures, Lower Dentures, Dental Advisory Given   Pulmonary COPD, former smoker,    breath sounds clear to auscultation       Cardiovascular hypertension, Pt. on home beta blockers and Pt. on medications + CAD, + CABG, + Peripheral Vascular Disease and +CHF  + dysrhythmias Atrial Fibrillation + Valvular Problems/Murmurs AS and AI  Rhythm:Irregular Rate:Abnormal     Neuro/Psych PSYCHIATRIC DISORDERS Depression  Neuromuscular disease    GI/Hepatic Neg liver ROS, GERD  Medicated,  Endo/Other  diabetes, Type 2, Insulin Dependent, Oral Hypoglycemic Agents  Renal/GU Renal disease  negative genitourinary   Musculoskeletal  (+) Arthritis , Osteoarthritis,    Abdominal   Peds negative pediatric ROS (+)  Hematology negative hematology ROS (+)   Anesthesia Other Findings   Reproductive/Obstetrics negative OB ROS                           Lab Results  Component Value Date   WBC 8.0 10/28/2016   HGB 14.6 10/28/2016   HCT 43.6 10/28/2016   MCV 91.2 10/28/2016   PLT 197.0 10/28/2016   Lab Results  Component Value Date   CREATININE 1.08 10/28/2016   BUN 27 (H) 10/28/2016   NA 141 10/28/2016   K 5.2 (H) 10/28/2016   CL 106 10/28/2016   CO2 30 10/28/2016   Lab Results  Component Value Date   INR 0.95 01/27/2016   INR 1.39 06/25/2015    Echo - Left ventricle: There was mild concentric hypertrophy. Systolic   function was normal. The estimated ejection fraction was in the   range of 55% to 60%. Wall motion was normal; there were no   regional wall  motion abnormalities. - Aortic valve: Moderately calcified with mild to moderate aortic   stenosis. - Mitral valve: There was mild regurgitation. No vegetation. - Left atrium: The atrium was dilated. No evidence of thrombus in   the atrial cavity or appendage. No evidence of thrombus in the   atrial cavity or appendage. - Right atrium: No evidence of thrombus in the atrial cavity or   appendage. - Atrial septum: No defect or patent foramen ovale was identified. - Tricuspid valve: There was trivial regurgitation. No vegetation. - Pulmonic valve: No evidence of vegetation.  Anesthesia Physical Anesthesia Plan  ASA: III  Anesthesia Plan: General   Post-op Pain Management:    Induction: Intravenous  Airway Management Planned: Natural Airway  Additional Equipment:   Intra-op Plan:   Post-operative Plan:   Informed Consent: I have reviewed the patients History and Physical, chart, labs and discussed the procedure including the risks, benefits and alternatives for the proposed anesthesia with the patient or authorized representative who has indicated his/her understanding and acceptance.     Plan Discussed with: CRNA  Anesthesia Plan Comments:         Anesthesia Quick Evaluation

## 2016-11-03 ENCOUNTER — Encounter (HOSPITAL_COMMUNITY): Payer: Self-pay | Admitting: Cardiovascular Disease

## 2016-11-04 NOTE — Progress Notes (Signed)
Cardiology Office Note    Date:  11/05/2016   ID:  Charles [name], DOB [birth year], MRN [id number]  PCP:  [name], MD  Cardiologist: Dr. [name]   Chief Complaint  Patient presents with  . Follow-up    History of Present Illness:    Charles [name] is a 83 y.o. male with past medical history of CAD (s/p CABG in 2004 with LIMA-LAD, SVG-RCA, SVG-OM, and SVG-RI), carotid artery disease (s/p L endarterectomy), HTN, HLD, Type 2 DM, and paroxysmal atrial flutter (s/p DCCV in 06/2015) and moderate to severe AS who presents to the office today for follow-up from recent TEE/DCCV.   Was last seen by Dr.Berry on 10/29/2016 and reported worsening dyspnea with exertion and fatigue and noted to be back in atrial flutter. BNP recently checked and elevated to 812. He was restarted on Eliquis with plans for a repeat TEE/DCCV. This was performed on 11/02/2016 and showed a reduced EF of 30-35% (previously 55-60% by echo in 06/2015), diffuse HK, moderate AS, and moderate TR. He underwent successful DCCV with 150J.   In talking with the patient today, he reports significant improvement in his dyspnea following recent cardioversion. Orthopnea has improved. He still has significant lower extremity edema on examination.  Reports good urine output with Lasix 80 mg twice a day. Is up 3-4 times per night with urination, only having to do this once per night prior to recent increase (on Lasix 40mg  daily prior to dose adjustment).   He denies any repeat episodes of chest discomfort or palpitations. Reports good compliance with his Eliquis. Denies any evidence of active bleeding.   Past Medical History:  Diagnosis Date  . Abdominal aortic atherosclerosis ([place])    by xray  . Aortic stenosis 06/2014   moderate-severe  . Arthritis    in lower back (06/24/2015)  . Atrial flutter ([place])    notes 06/24/2015  . 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 CAD (coronary artery disease) 2004    a. s/p CABG in 2004 with LIMA-LAD, SVG-RCA, SVG-OM, and SVG-RI  . Carotid stenosis 1999   s/p L CEA  . CHF (congestive heart failure) ([place]) dx'd 06/2015  . Colon polyp 2005   (Dr. Tiffany Kocher)  . Compression fracture of L1 lumbar vertebra (HCC) remote  . Compression fracture of L1 lumbar vertebra (HCC) 02/2016   chronic by xray  . COPD (chronic obstructive pulmonary disease) ([place]) 03/2011   by xray  . DDD (degenerative disc disease), lumbar 2014   severe L2/3 L5/S1 by xray  . Depression    hx  . Ex-smoker   . GERD (gastroesophageal reflux disease) 2003   h/o duodenal ulcer per EGD as well as esophagitis  . History of chicken pox   . History of colon polyps 2003, 2005   adenomatous Vira Agar)  . HLD (hyperlipidemia)   . HTN (hypertension)   . Psoriasis   . Renal artery stenosis ([place]) 2004   70% bilateral, followed by cards  . Seasonal allergies   . T2DM (type 2 diabetes mellitus) ([place]) 1995  . Urge incontinence of urine     Past Surgical History:  Procedure Laterality Date  . CARDIAC CATHETERIZATION  03/06/2003   No intervention - recommend CABG  . CARDIOVASCULAR STRESS TEST  11/2010   normal perfusion, no evidence of ischemia, EF 62% post exercise  . CARDIOVASCULAR STRESS TEST  11/28/2012   Mild diaphragmatic attenuation; cannot exclude a focal region of nontransmural inferior scar  .  CARDIOVERSION N/A 06/25/2015   Procedure: CARDIOVERSION;  Surgeon: Pixie Casino, MD;  Location: [facility] ENDOSCOPY;  Service: Cardiovascular;  Laterality: N/A;  . CARDIOVERSION N/A 11/02/2016   Procedure: CARDIOVERSION;  Surgeon: [name], MD;  Location: [facility];  Service: Cardiovascular;  Laterality: N/A;  . CAROTID ENDARTERECTOMY Left 1999   ([place])  . CATARACT EXTRACTION W/ INTRAOCULAR LENS  IMPLANT, BILATERAL Bilateral 01/2013   [place]  . COLONOSCOPY  2003   colon polyp x3 - adenomatous Tiffany Kocher)  . COLONOSCOPY  10/08/2012   2 TA, diverticulosis, int hem, no rpt rec Tiffany Kocher)  . CORONARY  ANGIOPLASTY    . CORONARY ARTERY BYPASS GRAFT  03/07/2003   4v CABG ([place]) with LIMA to LAD, vein graft to RCA, 1st obtuse marginal, and ramus intermedius  . ESOPHAGOGASTRODUODENOSCOPY  10/08/2012   nl esophagus, duodenitis and erosive gastropathy, path - gastropathy no Hpylori, no rpt rec  . PERIPHERAL VASCULAR CATHETERIZATION N/A 02/02/2016   Procedure: Renal Angiography;  Surgeon: [name], MD;  Location: [place] CV LAB;  Service: Cardiovascular;  Laterality: N/A;  . PERIPHERAL VASCULAR CATHETERIZATION N/A 02/02/2016   Procedure: Abdominal Aortogram;  Surgeon: [name], MD;  Location: [place] CV LAB;  Service: Cardiovascular;  Laterality: N/A;  . RENAL DOPPLER  11/29/2011   Celiac&SMA-demonstrated vessel narrowing suggestive of a greater than 50% diameter reduction. Bilateral renal arteries-demonstrated vessel narrowing of 60-99% diameter reduction. Rt Kidney-mid pole lateral simple cyst noted measuring 1.29x0.76x1.11cm and exophytic cyst outside lower pole measuring 1.23x0.96x1.31. Lft Kidney-lateral mid to lower pole simple cyst measuering-1.24x9.83x1.24  . TEE WITHOUT CARDIOVERSION N/A 06/25/2015   Procedure: TRANSESOPHAGEAL ECHOCARDIOGRAM (TEE);  Surgeon: Pixie Casino, MD;  Location: [facility] ENDOSCOPY;  Service: Cardiovascular;  Laterality: N/A;  . TEE WITHOUT CARDIOVERSION N/A 11/02/2016   Procedure: TRANSESOPHAGEAL ECHOCARDIOGRAM (TEE);  Surgeon: [name], MD;  Location: [facility] ENDOSCOPY;  Service: Cardiovascular;  Laterality: N/A;  . TONSILLECTOMY    . UPPER GASTROINTESTINAL ENDOSCOPY  2003   reflux esophagitis, erosive gastropathy, duodenal ulcer    Current Medications: Outpatient Medications Prior to Visit  Medication Sig Dispense Refill  . acetaminophen (TYLENOL) 500 MG tablet Take 1 tablet (500 mg total) by mouth 3 (three) times daily.    [name] apixaban (ELIQUIS) 5 MG TABS tablet Take 1 tablet (5 mg total) by mouth 2 (two) times daily. 30 tablet 0  . BAYER  MICROLET LANCETS lancets Ck blood sugar three times daily and as directed. Dx 250.00. Insulin dependent 100 each 11  . citalopram (CELEXA) 20 MG tablet Take 1 tablet (20 mg total) by mouth daily. 30 tablet 6  . Cyanocobalamin (B-12) 500 MCG TABS Take 1 tablet by mouth daily.    . furosemide (LASIX) 40 MG tablet Take 2 tablets (80 mg total) by mouth 2 (two) times daily. 60 tablet 1  . gabapentin (NEURONTIN) 100 MG capsule Take 1 capsule (100 mg total) by mouth 2 (two) times daily as needed. 60 capsule 3  . glucose blood (BAYER CONTOUR NEXT TEST) test strip Ck blood sugar three times daily and as directed. Dx 250.00 insulin dependent 100 each 11  . HYDROcodone-acetaminophen (NORCO/VICODIN) 5-325 MG tablet Take 0.5-1 tablets by mouth 2 (two) times daily as needed for moderate pain. 60 tablet 0  . insulin glargine (LANTUS) 100 UNIT/ML injection Inject 0.35 mLs (35 Units total) into the skin at bedtime. 10 mL 3  . Insulin Syringe-Needle U-100 (B-D INS SYR ULTRAFINE 1CC/30G) 30G X 1/2" 1 ML MISC 1 Syringe by Does  not apply route as directed. 100 each 3  . Insulin Syringe-Needle U-100 (B-D INS SYRINGE 0.5CC/30GX1/2") 30G X 1/2" 0.5 ML MISC Use as directed to inject insulin. Dx: E11.65 100 each 3  . lisinopril (PRINIVIL,ZESTRIL) 40 MG tablet Take 1 tablet (40 mg total) by mouth daily. 90 tablet 1  . metFORMIN (GLUCOPHAGE) 1000 MG tablet Take 1 tablet (1,000 mg total) by mouth 2 (two) times daily with a meal. 180 tablet 1  . metoprolol (LOPRESSOR) 50 MG tablet Take 0.5 tablets (25 mg total) by mouth 2 (two) times daily.    . pantoprazole (PROTONIX) 20 MG tablet Take 1 tablet (20 mg total) by mouth daily. 30 tablet 6  . potassium chloride SA (K-DUR,KLOR-CON) 20 MEQ tablet Take 20 mEq by mouth daily.     . saw palmetto 160 MG capsule Take 450 mg by mouth daily.     . traZODone (DESYREL) 50 MG tablet Take 2 tablets (100 mg total) by mouth at bedtime. 180 tablet 0  . vitamin C (ASCORBIC ACID) 500 MG tablet Take  500 mg by mouth daily.    [name] atorvastatin (LIPITOR) 20 MG tablet TAKE 1 TABLET DAILY. 90 tablet 1   No facility-administered medications prior to visit.      Allergies:   Amlodipine; Other; Rosiglitazone maleate; and Tricor [fenofibrate]   Social History   Social History  . Marital status: Married    Spouse name: N/A  . Number of children: N/A  . Years of education: N/A   Social History Main Topics  . Smoking status: Former Smoker    Packs/day: 0.50    Years: 36.00    Types: Cigarettes    Quit date: 07/23/2013  . Smokeless tobacco: Never Used  . Alcohol use No  . Drug use: No  . Sexual activity: Not Currently   Other Topics Concern  . None   Social History Narrative   Caffeine: 4 cups coffee/day, some tea and soda   Lives with wife   Occupation: Retired   Activity: no regular exercise   Diet: good water, fruits/vegetables daily     Family History:  The patient's family history includes Cancer in his brother, brother, maternal grandmother, and mother; Diabetes in his sister; Heart attack in his maternal grandfather, paternal grandfather, and paternal grandmother; Heart disease in his father and sister; Hypertension in his father and sister; Lung disease in his brother; Stroke in his sister.   Review of Systems:   Please see the history of present illness.     General:  No chills, fever, night sweats or weight changes.  Cardiovascular:  No chest pain, orthopnea, palpitations, paroxysmal nocturnal dyspnea. Positive for dyspnea on exertion and edema Dermatological: No rash, lesions/masses Respiratory: No cough, dyspnea Urologic: No hematuria, dysuria Abdominal:   No nausea, vomiting, diarrhea, bright red blood per rectum, melena, or hematemesis Neurologic:  No visual changes, wkns, changes in mental status. All other systems reviewed and are otherwise negative except as noted above.   Physical Exam:    VS:  BP (!) 164/84   Pulse (!) 57   Ht 6\' 1"  (1.854 m)   Wt  199 lb 3.2 oz (90.4 kg)   BMI 26.28 kg/m    General: Well developed, elderly Caucasian male appearing in no acute distress. Head: Normocephalic, atraumatic, sclera non-icteric, no xanthomas, nares are without discharge.  Neck: No carotid bruits. JVD at 9cm.  Lungs: Respirations regular and unlabored, without wheezes or rales.  Heart: Regular rate and rhythm. No  S3 or S4.  No murmur, no rubs, or gallops appreciated. Abdomen: Soft, non-tender, non-distended with normoactive bowel sounds. No hepatomegaly. No rebound/guarding. No obvious abdominal masses. Msk:  Strength and tone appear normal for age. No joint deformities or effusions. Extremities: No clubbing or cyanosis. 2+ pitting edema up to mid-shins bilaterally.  Distal pedal pulses are 2+ bilaterally. Neuro: Alert and oriented X 3. Moves all extremities spontaneously. No focal deficits noted. Psych:  Responds to questions appropriately with a normal affect. Skin: No rashes or lesions noted  Wt Readings from Last 3 Encounters:  11/05/16 199 lb 3.2 oz (90.4 kg)  11/02/16 206 lb (93.4 kg)  10/29/16 206 lb (93.4 kg)    Studies/Labs Reviewed:   EKG:  EKG is ordered today.  The ekg ordered today demonstrates sinus bradycardia, HR 57, with 1st degree AV Block. No acute ST or T-wave changes.   Recent Labs: 10/28/2016: BUN 27; Creatinine, Ser 1.08; Platelets 197.0; Pro B Natriuretic peptide (BNP) 812.0; TSH 3.51 11/02/2016: Hemoglobin 13.6; Potassium 4.0; Sodium 141   Lipid Panel    Component Value Date/Time   CHOL 131 06/08/2016 1423   TRIG 95.0 06/08/2016 1423   TRIG 133 09/10/2011   HDL 39.50 06/08/2016 1423   CHOLHDL 3 06/08/2016 1423   VLDL 19.0 06/08/2016 1423   LDLCALC 73 06/08/2016 1423   LDLDIRECT 77 09/10/2011    Additional studies/ records that were reviewed today include:   TEE: 11/02/2016 Study Conclusions  - Left ventricle: Systolic function was moderately to severely   reduced. The estimated ejection fraction  was in the range of 30%   to 35%. Diffuse hypokinesis. - Aortic valve: Thickening. Calcification. Moderate . Valve   mobility was restricted. There was moderate stenosis. There was   mild regurgitation. - Mitral valve: Mildly calcified annulus. There was mild   regurgitation. - Left atrium: No evidence of thrombus in the atrial cavity or   appendage. No evidence of thrombus in the atrial cavity or   appendage. - Right ventricle: The cavity size was normal. Wall thickness was   normal. Systolic function was moderately reduced. - Right atrium: No evidence of thrombus in the atrial cavity or   appendage. - Atrial septum: No defect or patent foramen ovale was identified   by color flow Doppler. - Tricuspid valve: There was moderate regurgitation.  Assessment:    1. Acute on chronic systolic CHF (congestive heart failure) ([place])   2. Typical atrial flutter ([place])   3. Chronic anticoagulation   4. Left-sided carotid artery disease ([place])   5. Atherosclerosis of native coronary artery of native heart without angina pectoris   6. Essential hypertension   7. Medication management      Plan:   In order of problems listed above:  1. Acute on Chronic Systolic CHF - recent TEE on 11/02/2016 showed a reduced EF of 30-35%, previously 55-60% by echo in 06/2015. He denies any recent chest discomfort and it is possible this is a tachycardia mediated cardiomyopathy. - Reports his dyspnea has significantly improved since recent cardioversion but he continues to have lower extremity edema on examination. - Will recheck BMET today to ensure stable kidney function and plan to continue Lasix 80 mg twice a day with his significant volume overload. Would reassess within the next 1-2 weeks. - currently on Lopressor and tolerating this well for years, but consider switching to Coreg in the future. Continue ACE-I.   2. Typical Atrial Flutter/Need for Anticoagulation - s/p DCCV in 06/2015. Reported  worsening  dyspnea with exertion and noted to be back in atrial flutter at his last office visit. Underwent TEE/DCCV on 11/02/2016. Maintaining NSR on examination and by EKG today. - This patients CHA2DS2-VASc Score and unadjusted Ischemic Stroke Rate (% per year) is equal to 9.7 % stroke rate/year from a score of 6 (CHF, HTN, DM, Vascular, Age (2)). Continue Eliquis 5mg  BID for anticoagulation. Denies any evidence of active bleeding.  - continue Lopressor 25mg  BID. Would not increase at this time with HR in mid-50's.   3. Carotid Artery Disease - s/p L CEA.  - continue statin therapy. No ASA with concurrent use of Eliquis.   4. CAD - s/p CABG in 2004 with LIMA-LAD, SVG-RCA, SVG-OM, and SVG-RI.  - denies any recent anginal symptoms. EKG today is without acute ischemic changes. - continue statin, BB, and ACE-I. No ASA secondary to need for Eliquis.   5. HTN - BP elevated at 164/84 today. Similar value on recheck. Reports SBP in the 120's - 130's at home with similar values at his last office visits.  - continue current medication regimen for now. Recheck in 2 weeks.    Medication Adjustments/Labs and Tests Ordered: Current medicines are reviewed at length with the patient today.  Concerns regarding medicines are outlined above.  Medication changes, Labs and Tests ordered today are listed in the Patient Instructions below. Patient Instructions  Medication Instructions:  CONTINUE to take Lasix 80mg  (2 tablets) two times a day.  Labwork: Have labwork completed today (BMET).   Testing/Procedures: NONE  Follow-Up: Tuesday 2/13 at 1:30 pm with Bernerd Pho PA at [place] office.  If you need a refill on your cardiac medications before your next appointment, please call your pharmacy.  Arna Medici, [place]  11/05/2016 5:43 PM    [facility] [place], [place], [place]  [address] Phone: [phone]; Fax: [phone]  [address], [place], [place] [address] Phone: [phone]

## 2016-11-05 ENCOUNTER — Encounter: Payer: Self-pay | Admitting: Student

## 2016-11-05 ENCOUNTER — Ambulatory Visit (INDEPENDENT_AMBULATORY_CARE_PROVIDER_SITE_OTHER): Payer: Medicare Other | Admitting: Student

## 2016-11-05 VITALS — BP 164/84 | HR 57 | Ht 73.0 in | Wt 199.2 lb

## 2016-11-05 DIAGNOSIS — Z79899 Other long term (current) drug therapy: Secondary | ICD-10-CM | POA: Diagnosis not present

## 2016-11-05 DIAGNOSIS — I739 Peripheral vascular disease, unspecified: Secondary | ICD-10-CM

## 2016-11-05 DIAGNOSIS — I779 Disorder of arteries and arterioles, unspecified: Secondary | ICD-10-CM

## 2016-11-05 DIAGNOSIS — I483 Typical atrial flutter: Secondary | ICD-10-CM | POA: Diagnosis not present

## 2016-11-05 DIAGNOSIS — I5023 Acute on chronic systolic (congestive) heart failure: Secondary | ICD-10-CM

## 2016-11-05 DIAGNOSIS — I2510 Atherosclerotic heart disease of native coronary artery without angina pectoris: Secondary | ICD-10-CM

## 2016-11-05 DIAGNOSIS — I10 Essential (primary) hypertension: Secondary | ICD-10-CM

## 2016-11-05 DIAGNOSIS — Z7901 Long term (current) use of anticoagulants: Secondary | ICD-10-CM | POA: Diagnosis not present

## 2016-11-05 LAB — BASIC METABOLIC PANEL
BUN: 17 mg/dL (ref 7–25)
CHLORIDE: 98 mmol/L (ref 98–110)
CO2: 33 mmol/L — AB (ref 20–31)
Calcium: 9.6 mg/dL (ref 8.6–10.3)
Creat: 0.94 mg/dL (ref 0.70–1.11)
Glucose, Bld: 109 mg/dL — ABNORMAL HIGH (ref 65–99)
Potassium: 3.9 mmol/L (ref 3.5–5.3)
SODIUM: 140 mmol/L (ref 135–146)

## 2016-11-05 NOTE — Patient Instructions (Signed)
Medication Instructions:  CONTINUE to take Lasix 80mg  (2 tablets) two times a day.  Labwork: Have labwork completed today (BMET).   Testing/Procedures: NONE  Follow-Up: Tuesday 2/13 at 1:30 pm with Bernerd Pho PA at [place] office.    If you need a refill on your cardiac medications before your next appointment, please call your pharmacy.

## 2016-11-08 ENCOUNTER — Telehealth: Payer: Self-pay | Admitting: *Deleted

## 2016-11-08 NOTE — Telephone Encounter (Signed)
Called pt per Bernerd Pho, PA-C to discuss his lab results and advise him to increase K+ to 20 bid.  Pt verbalized understanding.

## 2016-11-09 ENCOUNTER — Other Ambulatory Visit: Payer: Self-pay | Admitting: Family Medicine

## 2016-11-15 NOTE — Progress Notes (Addendum)
Cardiology Office Note    Date:  11/16/2016   ID:  Charles [name], DOB 12-Aug-1932, MRN [id number]  PCP:  [name], MD  Cardiologist: Dr. [name]   Chief Complaint  Patient presents with  . Follow-up    2 weeks    History of Present Illness:    Charles [name] is a 83 y.o. male with past medical history of CAD (s/p CABG in 2004 with LIMA-LAD, SVG-RCA, SVG-OM, and SVG-RI), chronic systolic CHF (EF 99991111 by TEE in 10/2016, previously 55-60% in 2016), carotid artery disease (s/p L endarterectomy), HTN, HLD, Type 2 DM, and paroxysmal atrial flutter (s/p DCCV in 06/2015, repeat TEE/DCCV in 10/2016) and moderate to severe AS who presents to the office today for follow-up from recent office visit.   Was seen by myself on 11/05/2016 following recent successful TEE/DCCV on 11/02/2016. Reported improvement in his dyspnea since the procedure but was still volume overloaded on physical exam. Weight 199 lbs at the time of his visit. Was continued on Lasix 80mg  BID with kidney function remaining stable (creatinine 0.94).  In talking with the patient today, he reports significant improvement in his dyspnea with exertion and lower extremity edema since his last office visit. Weight is down to 195 lbs. He reports very frequent urination with the increased Lasix dosing and asks if this can be reduced due to not sleeping well at night secondary to nocturia. He was on 80 mg daily prior to the recent dose adjustment.    He denies any recent chest discomfort or palpitations. Says his energy level has significantly improved since undergoing cardioversion. Reports good compliance with his Eliquis, denying and melena, hematochezia or hematuria.   Checks BP at home and reports systolic readings in the Q000111Q - 130's. Takes Lopressor 25mg  BID and Lisinopril 40mg  daily.    Past Medical History:  Diagnosis Date  . Abdominal aortic atherosclerosis ([place])    by xray  . Aortic stenosis 06/2014   moderate-severe   . Arthritis    in lower back (06/24/2015)  . Atrial flutter ([place])    notes 06/24/2015  . 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 CAD (coronary artery disease) 2004   a. s/p CABG in 2004 with LIMA-LAD, SVG-RCA, SVG-OM, and SVG-RI  . Carotid stenosis 1999   s/p L CEA  . CHF (congestive heart failure) ([place]) dx'd 06/2015   a. EF 55-60% by echo in 2016. b. 10/2016: EF reduced to 30-35% by TEE in the setting of atrial fibrillation with RVR  . Colon polyp 2005   (Dr. Tiffany Kocher)  . Compression fracture of L1 lumbar vertebra (HCC) remote  . Compression fracture of L1 lumbar vertebra (HCC) 02/2016   chronic by xray  . COPD (chronic obstructive pulmonary disease) ([place]) 03/2011   by xray  . DDD (degenerative disc disease), lumbar 2014   severe L2/3 L5/S1 by xray  . Depression    hx  . Ex-smoker   . GERD (gastroesophageal reflux disease) 2003   h/o duodenal ulcer per EGD as well as esophagitis  . History of chicken pox   . History of colon polyps 2003, 2005   adenomatous Vira Agar)  . HLD (hyperlipidemia)   . HTN (hypertension)   . Psoriasis   . Renal artery stenosis ([place]) 2004   70% bilateral, followed by cards  . Seasonal allergies   . T2DM (type 2 diabetes mellitus) ([place]) 1995  . Urge incontinence of urine     Past  Surgical History:  Procedure Laterality Date  . CARDIAC CATHETERIZATION  03/06/2003   No intervention - recommend CABG  . CARDIOVASCULAR STRESS TEST  11/2010   normal perfusion, no evidence of ischemia, EF 62% post exercise  . CARDIOVASCULAR STRESS TEST  11/28/2012   Mild diaphragmatic attenuation; cannot exclude a focal region of nontransmural inferior scar  . CARDIOVERSION N/A 06/25/2015   Procedure: CARDIOVERSION;  Surgeon: Pixie Casino, MD;  Location: [facility] ENDOSCOPY;  Service: Cardiovascular;  Laterality: N/A;  . CARDIOVERSION N/A 11/02/2016   Procedure: CARDIOVERSION;  Surgeon: [name], MD;  Location: [place];  Service:  Cardiovascular;  Laterality: N/A;  . CAROTID ENDARTERECTOMY Left 1999   ([place])  . CATARACT EXTRACTION W/ INTRAOCULAR LENS  IMPLANT, BILATERAL Bilateral 01/2013   [place]  . COLONOSCOPY  2003   colon polyp x3 - adenomatous Tiffany Kocher)  . COLONOSCOPY  10/08/2012   2 TA, diverticulosis, int hem, no rpt rec Tiffany Kocher)  . CORONARY ANGIOPLASTY    . CORONARY ARTERY BYPASS GRAFT  03/07/2003   4v CABG ([place]) with LIMA to LAD, vein graft to RCA, 1st obtuse marginal, and ramus intermedius  . ESOPHAGOGASTRODUODENOSCOPY  10/08/2012   nl esophagus, duodenitis and erosive gastropathy, path - gastropathy no Hpylori, no rpt rec  . PERIPHERAL VASCULAR CATHETERIZATION N/A 02/02/2016   Procedure: Renal Angiography;  Surgeon: [name], MD;  Location: [place] CV LAB;  Service: Cardiovascular;  Laterality: N/A;  . PERIPHERAL VASCULAR CATHETERIZATION N/A 02/02/2016   Procedure: Abdominal Aortogram;  Surgeon: [name], MD;  Location: [place] CV LAB;  Service: Cardiovascular;  Laterality: N/A;  . RENAL DOPPLER  11/29/2011   Celiac&SMA-demonstrated vessel narrowing suggestive of a greater than 50% diameter reduction. Bilateral renal arteries-demonstrated vessel narrowing of 60-99% diameter reduction. Rt Kidney-mid pole lateral simple cyst noted measuring 1.29x0.76x1.11cm and exophytic cyst outside lower pole measuring 1.23x0.96x1.31. Lft Kidney-lateral mid to lower pole simple cyst measuering-1.24x9.83x1.24  . TEE WITHOUT CARDIOVERSION N/A 06/25/2015   Procedure: TRANSESOPHAGEAL ECHOCARDIOGRAM (TEE);  Surgeon: Pixie Casino, MD;  Location[facility] ENDOSCOPY;  Service: Cardiovascular;  Laterality: N/A;  . TEE WITHOUT CARDIOVERSION N/A 11/02/2016   Procedure: TRANSESOPHAGEAL ECHOCARDIOGRAM (TEE);  Surgeon: [name], MD;  Location: [facility] END[facility]COPY;  Service: Cardiovascular;  Laterality: N/A;  . TONSILLECTOMY    . UPPER GASTROINTESTINAL ENDOSCOPY  2003   reflux esophagitis, erosive gastropathy, duodenal ulcer      Current Medications: Outpatient Medications Prior to Visit  Medication Sig Dispense Refill  . acetaminophen (TYLENOL) 500 MG tablet Take 1 tablet (500 mg total) by mouth 3 (three) times daily.    [name] atorvastatin (LIPITOR) 20 MG tablet Take 20 mg by mouth daily.    [name] BAYER MICROLET LANCETS lancets Ck blood sugar three times daily and as directed. Dx 250.00. Insulin dependent 100 each 11  . citalopram (CELEXA) 20 MG tablet Take 1 tablet (20 mg total) by mouth daily. 30 tablet 6  . Cyanocobalamin (B-12) 500 MCG TABS Take 1 tablet by mouth daily.    [name] ELIQUIS 5 MG TABS tablet Take 1 tablet (5 mg total) by mouth 2 (two) times daily. 60 tablet 6  . gabapentin (NEURONTIN) 100 MG capsule Take 1 capsule (100 mg total) by mouth 2 (two) times daily as needed. 60 capsule 3  . glucose blood (BAYER CONTOUR NEXT TEST) test strip Ck blood sugar three times daily and as directed. Dx 250.00 insulin dependent 100 each 11  . HYDROcodone-acetaminophen (NORCO/VICODIN) 5-325 MG tablet Take 0.5-1 tablets by mouth  2 (two) times daily as needed for moderate pain. 60 tablet 0  . insulin glargine (LANTUS) 100 UNIT/ML injection Inject 0.35 mLs (35 Units total) into the skin at bedtime. 10 mL 3  . Insulin Syringe-Needle U-100 (B-D INS SYR ULTRAFINE 1CC/30G) 30G X 1/2" 1 ML MISC 1 Syringe by Does not apply route as directed. 100 each 3  . Insulin Syringe-Needle U-100 (B-D INS SYRINGE 0.5CC/30GX1/2") 30G X 1/2" 0.5 ML MISC Use as directed to inject insulin. Dx: E11.65 100 each 3  . lisinopril (PRINIVIL,ZESTRIL) 40 MG tablet Take 1 tablet (40 mg total) by mouth daily. 90 tablet 1  . metFORMIN (GLUCOPHAGE) 1000 MG tablet Take 1 tablet (1,000 mg total) by mouth 2 (two) times daily with a meal. 180 tablet 1  . metoprolol (LOPRESSOR) 50 MG tablet Take 0.5 tablets (25 mg total) by mouth 2 (two) times daily.    . pantoprazole (PROTONIX) 20 MG tablet Take 1 tablet (20 mg total) by mouth daily. 30 tablet 6  . saw palmetto 160 MG  capsule Take 450 mg by mouth daily.     . traZODone (DESYREL) 50 MG tablet Take 2 tablets (100 mg total) by mouth at bedtime. 180 tablet 0  . vitamin C (ASCORBIC ACID) 500 MG tablet Take 500 mg by mouth daily.    . furosemide (LASIX) 40 MG tablet Take 2 tablets (80 mg total) by mouth 2 (two) times daily. 60 tablet 1  . potassium chloride SA (K-DUR,KLOR-CON) 20 MEQ tablet Take 20 mEq by mouth 2 (two) times daily.     No facility-administered medications prior to visit.      Allergies:   Amlodipine; Other; Rosiglitazone maleate; and Tricor [fenofibrate]   Social History   Social History  . Marital status: Married    Spouse name: N/A  . Number of children: N/A  . Years of education: N/A   Social History Main Topics  . Smoking status: Former Smoker    Packs/day: 0.50    Years: 36.00    Types: Cigarettes    Quit date: 07/23/2013  . Smokeless tobacco: Never Used  . Alcohol use No  . Drug use: No  . Sexual activity: Not Currently   Other Topics Concern  . None   Social History Narrative   Caffeine: 4 cups coffee/day, some tea and soda   Lives with wife   Occupation: Retired   Activity: no regular exercise   Diet: good water, fruits/vegetables daily     Family History:  The patient's family history includes Cancer in his brother, brother, maternal grandmother, and mother; Diabetes in his sister; Heart attack in his maternal grandfather, paternal grandfather, and paternal grandmother; Heart disease in his father and sister; Hypertension in his father and sister; Lung disease in his brother; Stroke in his sister.   Review of Systems:   Please see the history of present illness.     General:  No chills, fever, night sweats or weight changes.  Cardiovascular:  No chest pain, edema, orthopnea, palpitations, paroxysmal nocturnal dyspnea. Positive for dyspnea with exertion.  Dermatological: No rash, lesions/masses Respiratory: No cough, dyspnea Urologic: No hematuria,  dysuria Abdominal:   No nausea, vomiting, diarrhea, bright red blood per rectum, melena, or hematemesis Neurologic:  No visual changes, wkns, changes in mental status. Positive for lower extremity weakness.  All other systems reviewed and are otherwise negative except as noted above.   Physical Exam:    VS:  BP (!) 156/78   Pulse (!) 51  Ht 6\' 1"  (1.854 m)   Wt 195 lb (88.5 kg)   BMI 25.73 kg/m    General: Well developed, well nourished elderly Caucasian male appearing in no acute distress. Head: Normocephalic, atraumatic, sclera non-icteric, no xanthomas, nares are without discharge.  Neck: No carotid bruits. JVD at 9cm.  Lungs: Respirations regular and unlabored, without wheezes or rales.  Heart: Regular rate and rhythm. No S3 or S4.  No rubs, or gallops appreciated. 3/6 SEM along RUSB. Abdomen: Soft, non-tender, non-distended with normoactive bowel sounds. No hepatomegaly. No rebound/guarding. No obvious abdominal masses. Msk:  Strength and tone appear normal for age. No joint deformities or effusions. Extremities: No clubbing or cyanosis. No edema.  Distal pedal pulses are 2+ bilaterally. Neuro: Alert and oriented X 3. Moves all extremities spontaneously. No focal deficits noted. Psych:  Responds to questions appropriately with a normal affect. Skin: No rashes or lesions noted  Wt Readings from Last 3 Encounters:  11/16/16 195 lb (88.5 kg)  11/05/16 199 lb 3.2 oz (90.4 kg)  11/02/16 206 lb (93.4 kg)     Studies/Labs Reviewed:   EKG:  EKG is not ordered today.   Recent Labs: 10/28/2016: Platelets 197.0; Pro B Natriuretic peptide (BNP) 812.0; TSH 3.51 11/02/2016: Hemoglobin 13.6 11/05/2016: BUN 17; Creat 0.94; Potassium 3.9; Sodium 140   Lipid Panel    Component Value Date/Time   CHOL 131 06/08/2016 1423   TRIG 95.0 06/08/2016 1423   TRIG 133 09/10/2011   HDL 39.50 06/08/2016 1423   CHOLHDL 3 06/08/2016 1423   VLDL 19.0 06/08/2016 1423   LDLCALC 73 06/08/2016 1423    LDLDIRECT 77 09/10/2011    Additional studies/ records that were reviewed today include:   TEE: 11/02/2016 Study Conclusions  - Left ventricle: Systolic function was moderately to severely   reduced. The estimated ejection fraction was in the range of 30%   to 35%. Diffuse hypokinesis. - Aortic valve: Thickening. Calcification. Moderate . Valve   mobility was restricted. There was moderate stenosis. There was   mild regurgitation. - Mitral valve: Mildly calcified annulus. There was mild   regurgitation. - Left atrium: No evidence of thrombus in the atrial cavity or   appendage. No evidence of thrombus in the atrial cavity or   appendage. - Right ventricle: The cavity size was normal. Wall thickness was   normal. Systolic function was moderately reduced. - Right atrium: No evidence of thrombus in the atrial cavity or   appendage. - Atrial septum: No defect or patent foramen ovale was identified   by color flow Doppler. - Tricuspid valve: There was moderate regurgitation.  Assessment:    1. Atherosclerosis of native coronary artery of native heart without angina pectoris   2. Chronic systolic congestive heart failure ([place])   3. Typical atrial flutter ([place])   4. Chronic anticoagulation   5. Nonrheumatic aortic valve stenosis   6. Essential hypertension      Plan:   In order of problems listed above:  1. Chronic Systolic CHF - EF 99991111 by TEE in 10/2016, previously 55-60% in 2016. Had significant lower extremity edema at the time of his last office visit. Lasix was increased to 80mg  BID and he reports significant improvement in his symptoms. Weight down 4 lbs.  - he does not appear volume overloaded on physical exam today. Will reduce Lasix dosing back to 80 mg daily (does not take this BID secondary to nocturia) and K+ to 78mEq once daily. He does not own a set  of scales. I encouraged him purchasing this and weighing himself daily as a way to monitor fluid status.  - We  discussed switching his Metoprolol to Toprol-XL or Carvedilol in the setting of his reduced ejection fraction. He is not interested in making changes at this time as he reports "tolerating his current medications well for years". Will therefore continue Lopressor and Lisinopril.  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   2. CAD - s/p CABG in 2004 with LIMA-LAD, SVG-RCA, SVG-OM, and SVG-RI. - He denies any recent chest discomfort or dyspnea with exertion. - continue BB, statin, and ACE-I. No ASA due to need for Eliquis.   3. Paroxysmal Atrial Flutter/ Chronic Anticoagulation - maintaining NSR by examination following recent TEE/DCCV in 10/2016. - continue Lopressor 25mg  BID for rate-control. - This patients CHA2DS2-VASc Score and unadjusted Ischemic Stroke Rate (% per year) is equal to 9.7 % stroke rate/year from a score of 6 (CHF, HTN, DM, Vascular, Age (2)). Denies any melena, hematochezia, or hematuria. Continue Eliquis 5mg  BID.   4. Aortic Stenosis - moderate by TEE in 10/2016. Continue to follow.   5. Essential HTN - BP at 156/78 on initial check. At 152/78 on recheck. He still reports systolic readings in the Q000111Q - 130's at home. I advised him to bring his cuff to his next appointment so this can be checked against our readings. - Continue Lisinopril 40 mg daily and Lopressor 25 mg BID. Unable to further titrate BB secondary to bradycardia.   Medication Adjustments/Labs and Tests Ordered: Current medicines are reviewed at length with the patient today.  Concerns regarding medicines are outlined above.  Medication changes, Labs and Tests ordered today are listed in the Patient Instructions below. Patient Instructions  Medication Instructions:  STOP Aspirin  DECREASE Lasix to 80mg  Once a day (decrease from twice a day to once a day) DECREASE  Potassium to 38meq Once a day (decreased from 74meq to 6meq)  Labwork: None   Testing/Procedures: None   Follow-Up: Your physician recommends that you schedule a follow-up appointment in: 2 months with Dr [name].   Any Other Special Instructions Will Be Listed Below (If Applicable).  If you need a refill on your cardiac medications before your next appointment, please call your pharmacy.  Arna Medici, [place]  11/16/2016 4:29 PM    [facility] [address] [place], [place]  [address] Phone: [phone]; Fax: [phone]  [address], [place], [place] [address] Phone: [phone]

## 2016-11-16 ENCOUNTER — Ambulatory Visit (INDEPENDENT_AMBULATORY_CARE_PROVIDER_SITE_OTHER): Payer: Medicare Other | Admitting: Student

## 2016-11-16 ENCOUNTER — Encounter: Payer: Self-pay | Admitting: Student

## 2016-11-16 VITALS — BP 156/78 | HR 51 | Ht 73.0 in | Wt 195.0 lb

## 2016-11-16 DIAGNOSIS — I483 Typical atrial flutter: Secondary | ICD-10-CM

## 2016-11-16 DIAGNOSIS — I350 Nonrheumatic aortic (valve) stenosis: Secondary | ICD-10-CM

## 2016-11-16 DIAGNOSIS — I2510 Atherosclerotic heart disease of native coronary artery without angina pectoris: Secondary | ICD-10-CM | POA: Diagnosis not present

## 2016-11-16 DIAGNOSIS — Z7901 Long term (current) use of anticoagulants: Secondary | ICD-10-CM | POA: Diagnosis not present

## 2016-11-16 DIAGNOSIS — I5022 Chronic systolic (congestive) heart failure: Secondary | ICD-10-CM | POA: Diagnosis not present

## 2016-11-16 DIAGNOSIS — I10 Essential (primary) hypertension: Secondary | ICD-10-CM

## 2016-11-16 MED ORDER — FUROSEMIDE 40 MG PO TABS
80.0000 mg | ORAL_TABLET | Freq: Every day | ORAL | 1 refills | Status: DC
Start: 2016-11-16 — End: 2016-11-29

## 2016-11-16 MED ORDER — POTASSIUM CHLORIDE CRYS ER 20 MEQ PO TBCR: 20.0000 meq | EXTENDED_RELEASE_TABLET | Freq: Every day | ORAL | Status: DC

## 2016-11-16 NOTE — Patient Instructions (Addendum)
Medication Instructions:  STOP Aspirin  DECREASE Lasix to 80mg  Once a day (decrease from twice a day to once a day) DECREASE Potassium to 52meq Once a day (decreased from 64meq to 47meq)  Labwork: None   Testing/Procedures: None   Follow-Up: Your physician recommends that you schedule a follow-up appointment in: 2 months with Dr [name].   Any Other Special Instructions Will Be Listed Below (If Applicable).  If you need a refill on your cardiac medications before your next appointment, please call your pharmacy.  NEXT APPOINTMENT:  DR BERRY  DATE:________________________________________________  TIME:_________________AM/PM

## 2016-11-18 ENCOUNTER — Other Ambulatory Visit: Payer: Self-pay | Admitting: Family Medicine

## 2016-11-25 ENCOUNTER — Telehealth: Payer: Self-pay | Admitting: Family Medicine

## 2016-11-25 ENCOUNTER — Encounter: Payer: Self-pay | Admitting: Family Medicine

## 2016-11-25 DIAGNOSIS — R310 Gross hematuria: Secondary | ICD-10-CM | POA: Insufficient documentation

## 2016-11-25 MED ORDER — APIXABAN 2.5 MG PO TABS: 2.5000 mg | ORAL_TABLET | Freq: Two times a day (BID) | ORAL | 6 refills | Status: DC

## 2016-11-25 NOTE — Telephone Encounter (Signed)
Received message from son - pt has started passing small clots in urine. Likely from eliquis - will decrease to 2.5mg  BID and I asked pt to come in for appt if clots persist.

## 2016-11-29 ENCOUNTER — Telehealth: Payer: Self-pay

## 2016-11-29 MED ORDER — FUROSEMIDE 40 MG PO TABS: ORAL_TABLET | ORAL | 3 refills | Status: DC

## 2016-11-29 NOTE — Telephone Encounter (Signed)
Charles [name] pts son (DPR signed) left v/m;feet are swollen more than usual; wanted to know if needed to increase furosemide or does pt need to be seen. Charles [name] request cb.

## 2016-11-29 NOTE — Telephone Encounter (Signed)
Spoken and notified [name] (patient's son) of Dr Gutierrez's comments. Patient's son verbalized understanding.

## 2016-11-29 NOTE — Telephone Encounter (Signed)
Recommend lasix 80mg  BID x 3 days then take lasix 80mg  in am and 40mg  at 2pm as new dose. Call us with update at end of week.

## 2016-12-02 ENCOUNTER — Other Ambulatory Visit: Payer: Self-pay | Admitting: Family Medicine

## 2016-12-20 ENCOUNTER — Other Ambulatory Visit: Payer: Self-pay | Admitting: Family Medicine

## 2017-01-17 ENCOUNTER — Other Ambulatory Visit: Payer: Self-pay | Admitting: Family Medicine

## 2017-01-17 NOTE — Telephone Encounter (Signed)
Ok to refill? Last filled 10/22/16 #[id number] 0RF

## 2017-01-19 ENCOUNTER — Encounter: Payer: Self-pay | Admitting: Cardiovascular Disease

## 2017-01-19 ENCOUNTER — Ambulatory Visit (INDEPENDENT_AMBULATORY_CARE_PROVIDER_SITE_OTHER): Payer: Medicare Other | Admitting: Cardiovascular Disease

## 2017-01-19 VITALS — BP 112/66 | HR 63 | Ht 73.0 in | Wt 202.0 lb

## 2017-01-19 DIAGNOSIS — I350 Nonrheumatic aortic (valve) stenosis: Secondary | ICD-10-CM | POA: Diagnosis not present

## 2017-01-19 DIAGNOSIS — I483 Typical atrial flutter: Secondary | ICD-10-CM | POA: Diagnosis not present

## 2017-01-19 NOTE — Assessment & Plan Note (Signed)
New-onset atrial flutter status post outpatient TEE guided direct current cardioversion by Dr. Oval Linsey successfully with 1 shock on 11/02/16. He symptomatically improved. He remains in sinus rhythm today on Eliquis .

## 2017-01-19 NOTE — Assessment & Plan Note (Signed)
Charles [name] has moderate to severe aortic stenosis although he is asymptomatic at this time. His valve area is 0.92 mm with a peak gradient of 49 mmHg by 2-D echo 06/13/15. We will repeat his echo in 6 months I'll see him back after that for further evaluation.

## 2017-01-19 NOTE — Progress Notes (Signed)
01/19/2017 Charles [name]   [birth year]  [id number]  Primary Physician [name], MD Primary Cardiologist: [name] MD Charles [name]  HPI:  The patient is a delightful, 83 year old, mildly overweight, married Caucasian male whose wife was also a patient of mine. She unfortunately passed away 08-05-2015. He currently lives with one of his sons and appears to be fairly depressed. He was married for 51 years.He is a father of 26, grandfather to 4 grandchildren. I last saw him in the office 10/29/16. He is accompanied by his son Charles [name] today.Charles [name] He has a history of CAD status post coronary artery bypass grafting x4 June 2004 with a LIMA to his LAD; a vein to the right coronary artery, obtuse marginal branch and ramus branch. He also was found to have 70% bilateral renal artery stenosis which we have been following by duplex ultrasound. He has had left carotid endarterectomy which we follow as well. His other problems include hypertension, hyperlipidemia and noninsulin-requiring diabetes. He is asymptomatic. His last functional study performed 2/14 was nonischemic. he denies chest pain or shortness of breath. He had new onset atrial flutter back in the fall and underwent TEE guided DC cardioversion by Dr. Debara Pickett 06/25/15 successfully to sinus rhythm. He was placed on Eliquis oral anticoagulation but has ultimately decided not to take this for various reasons.recent renal Dopplers have suggested progression of disease bilaterally left greater than right. His blood pressures have been more difficult to control as well. In addition, his echo performed 06/13/15 was significant for moderate to severe aortic stenosis with a valve area of 0.9 cm2 and a peak gradient of 49 mmHg. since I saw him in the office 5 months ago he's noticed increasing shortness of breath and fatigue over the last 3 weeks. He saw his PCP who did an EKG demonstrating recurrent atrial flutter with slow ventricular response. He  underwent successful outpatient TEE guided cardioversion by Dr. Oval Linsey on 11/02/16 back to sinus rhythm with one shock. He remains on oral anticoagulation. He is currently improved and is currently asymptomatic.  Current Outpatient Prescriptions  Medication Sig Dispense Refill  . acetaminophen (TYLENOL) 500 MG tablet Take 1 tablet (500 mg total) by mouth 3 (three) times daily.    Charles [name] apixaban (ELIQUIS) 2.5 MG TABS tablet Take 1 tablet (2.5 mg total) by mouth 2 (two) times daily. 60 tablet 6  . atorvastatin (LIPITOR) 20 MG tablet Take 20 mg by mouth daily.    Charles [name] BAYER MICROLET LANCETS lancets Ck blood sugar three times daily and as directed. Dx 250.00. Insulin dependent 100 each 11  . citalopram (CELEXA) 20 MG tablet Take 1 tablet (20 mg total) by mouth daily. 30 tablet 6  . Cyanocobalamin (B-12) 500 MCG TABS Take 1 tablet by mouth daily.    . furosemide (LASIX) 40 MG tablet Take 80mg  in am and 40mg  in afternoon 90 tablet 3  . gabapentin (NEURONTIN) 100 MG capsule Take 1 capsule (100 mg total) by mouth 2 (two) times daily as needed. 60 capsule 3  . glucose blood (BAYER CONTOUR NEXT TEST) test strip Ck blood sugar three times daily and as directed. Dx 250.00 insulin dependent 100 each 11  . HYDROcodone-acetaminophen (NORCO/VICODIN) 5-325 MG tablet Take 0.5-1 tablets by mouth 2 (two) times daily as needed for moderate pain. 60 tablet 0  . insulin glargine (LANTUS) 100 UNIT/ML injection Inject 0.35 mLs (35 Units total) into the skin at bedtime. 10 mL 3  . Insulin Syringe-Needle U-100 (  B-D INS SYR ULTRAFINE 1CC/30G) 30G X 1/2" 1 ML MISC 1 Syringe by Does not apply route as directed. 100 each 3  . Insulin Syringe-Needle U-100 (B-D INS SYRINGE 0.5CC/30GX1/2") 30G X 1/2" 0.5 ML MISC Use as directed to inject insulin. Dx: E11.65 100 each 3  . lisinopril (PRINIVIL,ZESTRIL) 40 MG tablet Take 1 tablet (40 mg total) by mouth daily. 90 tablet 1  . metFORMIN (GLUCOPHAGE) 1000 MG tablet Take 1 tablet (1,000 mg  total) by mouth 2 (two) times daily with a meal. 180 tablet 1  . metoprolol (LOPRESSOR) 50 MG tablet Take 0.5 tablets (25 mg total) by mouth 2 (two) times daily.    . metoprolol (LOPRESSOR) 50 MG tablet Take one-half (25mg ) tablet twice a day 60 tablet 1  . pantoprazole (PROTONIX) 20 MG tablet Take 1 tablet (20 mg total) by mouth daily. 30 tablet 6  . potassium chloride SA (K-DUR,KLOR-CON) 20 MEQ tablet Take 1 tablet (20 mEq total) by mouth daily. 30 tablet 3  . saw palmetto 160 MG capsule Take 450 mg by mouth daily.     . traZODone (DESYREL) 50 MG tablet Take 2 tablets (100 mg total) by mouth at bedtime. 180 tablet 0  . vitamin C (ASCORBIC ACID) 500 MG tablet Take 500 mg by mouth daily.     No current facility-administered medications for this visit.     Allergies  Allergen Reactions  . Amlodipine Other (See Comments)    Edema  . Other Other (See Comments)    myalgias Horse serum   . Rosiglitazone Maleate Other (See Comments)    REACTION: Did not help  . Tricor [Fenofibrate] Other (See Comments)    myalgias    Social History   Social History  . Marital status: Married    Spouse name: N/A  . Number of children: N/A  . Years of education: N/A   Occupational History  . Not on file.   Social History Main Topics  . Smoking status: Former Smoker    Packs/day: 0.50    Years: 36.00    Types: Cigarettes    Quit date: 07/23/2013  . Smokeless tobacco: Never Used  . Alcohol use No  . Drug use: No  . Sexual activity: Not Currently   Other Topics Concern  . Not on file   Social History Narrative   Caffeine: 4 cups coffee/day, some tea and soda   Lives with wife   Occupation: Retired   Activity: no regular exercise   Diet: good water, fruits/vegetables daily     Review of Systems: General: negative for chills, fever, night sweats or weight changes.  Cardiovascular: negative for chest pain, dyspnea on exertion, edema, orthopnea, palpitations, paroxysmal nocturnal dyspnea  or shortness of breath Dermatological: negative for rash Respiratory: negative for cough or wheezing Urologic: negative for hematuria Abdominal: negative for nausea, vomiting, diarrhea, bright red blood per rectum, melena, or hematemesis Neurologic: negative for visual changes, syncope, or dizziness All other systems reviewed and are otherwise negative except as noted above.    Blood pressure 112/66, pulse 63, height 6\' 1"  (1.854 m), weight 202 lb (91.6 kg), SpO2 91 %.  General appearance: alert and no distress Neck: no adenopathy, no carotid bruit, no JVD, supple, symmetrical, trachea midline and thyroid not enlarged, symmetric, no tenderness/mass/nodules Lungs: clear to auscultation bilaterally Heart: 2/6 systolic ejection murmur heard best at the base consistent with aortic stenosis. Extremities: extremities normal, atraumatic, no cyanosis or edema  EKG normal sinus rhythm at 63 with  evidence of borderline LVH with repolarization changes and septal Q waves. I personally reviewed this EKG.  ASSESSMENT AND PLAN:   Aortic stenosis Mr. Dupuy has moderate to severe aortic stenosis although he is asymptomatic at this time. His valve area is 0.92 mm with a peak gradient of 49 mmHg by 2-D echo 06/13/15. We will repeat his echo in 6 months I'll see him back after that for further evaluation.  Typical atrial flutter (HCC) New-onset atrial flutter status post outpatient TEE guided direct current cardioversion by Dr. Oval Linsey successfully with 1 shock on 11/02/16. He symptomatically improved. He remains in sinus rhythm today on Eliquis .       [name] MD FACP,FACC,FAHA, [facility] 01/19/2017 2:13 PM

## 2017-01-19 NOTE — Patient Instructions (Signed)
Medication Instructions: Your physician recommends that you continue on your current medications as directed. Please refer to the Current Medication list given to you today.   Testing/Procedures: Your physician has requested that you have an echocardiogram in 6 months prior to follow-up appt with Dr. [name]. Echocardiography is a painless test that uses sound waves to create images of your heart. It provides your doctor with information about the size and shape of your heart and how well your heart's chambers and valves are working. This procedure takes approximately one hour. There are no restrictions for this procedure.   Follow-Up: Your physician wants you to follow-up in: 6 months with Dr. [name]. You will receive a reminder letter in the mail two months in advance. If you don't receive a letter, please call our office to schedule the follow-up appointment.  If you need a refill on your cardiac medications before your next appointment, please call your pharmacy.

## 2017-01-24 ENCOUNTER — Other Ambulatory Visit: Payer: Self-pay | Admitting: Family Medicine

## 2017-01-24 NOTE — Addendum Note (Signed)
Addended by: Zebedee Iba on: 01/24/2017 08:22 AM   Modules accepted: Orders

## 2017-01-27 ENCOUNTER — Telehealth: Payer: Self-pay | Admitting: Family Medicine

## 2017-01-27 NOTE — Telephone Encounter (Signed)
Jeani Hawking at front desk spoke with [name] (pts son) and pt does not want to go to ED; pt is only SOB upon exertion and pt wants to see Dr Darnell Level only; Jeani Hawking scheduled appt with Dr Darnell Level on 01/28/17 at 11:15. Scott did agree if pt condition worsened would take pt to ED. FYI to Dr Darnell Level.

## 2017-01-27 NOTE — Telephone Encounter (Signed)
[facility]  Patient Name: Charles [name]  DOB: 1931-11-25    Initial Comment Caller Charles [name] is calling for his father. His father is gets short of breath when he gets up to move and he is also having prostate problems. His heart was fine last week according to a doctor report.   Nurse Assessment  Nurse: Harlow Mares, RN, Suanne Marker Date/Time (Eastern Time): 01/27/2017 10:34:07 AM  Confirm and document reason for call. If symptomatic, describe symptoms. ---Charles [name] is calling for his father. His father is gets short of breath when he gets up to move and he is also having prostate problems. His heart was fine last week according to a doctor report. Reports a "funny feeling" when he urinates, which he said is normal when he has a UTI. Denies fever.  Does the patient have any new or worsening symptoms? ---Yes  Will a triage be completed? ---Yes  Related visit to physician within the last 2 weeks? ---Yes  Does the PT have any chronic conditions? (i.e. diabetes, asthma, etc.) ---Yes  List chronic conditions. ---HTN; prostate issues; diabetic; CHF; high chol; Eloquis  Is this a behavioral health or substance abuse call? ---No     Guidelines    Guideline Title Affirmed Question Affirmed Notes  Chest Pain Pain also present in shoulder(s) or arm(s) or jaw (Exception: pain is clearly made worse by movement)    Final Disposition User   Go to ED Now Harlow Mares, RN, [facility] - ED   Disagree/Comply: Comply

## 2017-01-28 ENCOUNTER — Encounter: Payer: Self-pay | Admitting: Family Medicine

## 2017-01-28 ENCOUNTER — Ambulatory Visit (INDEPENDENT_AMBULATORY_CARE_PROVIDER_SITE_OTHER): Payer: Medicare Other | Admitting: Family Medicine

## 2017-01-28 VITALS — BP 204/104 | HR 56 | Temp 97.5°F | Wt 200.0 lb

## 2017-01-28 DIAGNOSIS — Z794 Long term (current) use of insulin: Secondary | ICD-10-CM | POA: Diagnosis not present

## 2017-01-28 DIAGNOSIS — I10 Essential (primary) hypertension: Secondary | ICD-10-CM

## 2017-01-28 DIAGNOSIS — N419 Inflammatory disease of prostate, unspecified: Secondary | ICD-10-CM | POA: Insufficient documentation

## 2017-01-28 DIAGNOSIS — E118 Type 2 diabetes mellitus with unspecified complications: Secondary | ICD-10-CM | POA: Diagnosis not present

## 2017-01-28 DIAGNOSIS — R001 Bradycardia, unspecified: Secondary | ICD-10-CM

## 2017-01-28 DIAGNOSIS — I483 Typical atrial flutter: Secondary | ICD-10-CM | POA: Diagnosis not present

## 2017-01-28 DIAGNOSIS — I2510 Atherosclerotic heart disease of native coronary artery without angina pectoris: Secondary | ICD-10-CM

## 2017-01-28 DIAGNOSIS — I350 Nonrheumatic aortic (valve) stenosis: Secondary | ICD-10-CM | POA: Diagnosis not present

## 2017-01-28 DIAGNOSIS — R0609 Other forms of dyspnea: Secondary | ICD-10-CM

## 2017-01-28 DIAGNOSIS — E1165 Type 2 diabetes mellitus with hyperglycemia: Secondary | ICD-10-CM

## 2017-01-28 DIAGNOSIS — F331 Major depressive disorder, recurrent, moderate: Secondary | ICD-10-CM

## 2017-01-28 DIAGNOSIS — IMO0002 Reserved for concepts with insufficient information to code with codable children: Secondary | ICD-10-CM

## 2017-01-28 DIAGNOSIS — I5022 Chronic systolic (congestive) heart failure: Secondary | ICD-10-CM | POA: Diagnosis not present

## 2017-01-28 LAB — CBC WITH DIFFERENTIAL/PLATELET
BASOS ABS: 0.1 10*3/uL (ref 0.0–0.1)
BASOS PCT: 1 % (ref 0.0–3.0)
EOS ABS: 0.3 10*3/uL (ref 0.0–0.7)
Eosinophils Relative: 4 % (ref 0.0–5.0)
HCT: 41.8 % (ref 39.0–52.0)
HEMOGLOBIN: 13.9 g/dL (ref 13.0–17.0)
Lymphocytes Relative: 21 % (ref 12.0–46.0)
Lymphs Abs: 1.3 10*3/uL (ref 0.7–4.0)
MCHC: 33.3 g/dL (ref 30.0–36.0)
MCV: 89.5 fl (ref 78.0–100.0)
MONO ABS: 0.8 10*3/uL (ref 0.1–1.0)
Monocytes Relative: 11.9 % (ref 3.0–12.0)
Neutro Abs: 4 10*3/uL (ref 1.4–7.7)
Neutrophils Relative %: 62.1 % (ref 43.0–77.0)
Platelets: 180 10*3/uL (ref 150.0–400.0)
RBC: 4.66 Mil/uL (ref 4.22–5.81)
RDW: 14.6 % (ref 11.5–15.5)
WBC: 6.4 10*3/uL (ref 4.0–10.5)

## 2017-01-28 LAB — BASIC METABOLIC PANEL
BUN: 22 mg/dL (ref 6–23)
CO2: 33 mEq/L — ABNORMAL HIGH (ref 19–32)
Calcium: 9.8 mg/dL (ref 8.4–10.5)
Chloride: 101 mEq/L (ref 96–112)
Creatinine, Ser: 0.94 mg/dL (ref 0.40–1.50)
GFR: 81.58 mL/min (ref >60.00)
GLUCOSE: 176 mg/dL — AB (ref 70–99)
Potassium: 4.3 mEq/L (ref 3.5–5.1)
SODIUM: 139 meq/L (ref 135–145)

## 2017-01-28 LAB — POC URINALSYSI DIPSTICK (AUTOMATED)
Bilirubin, UA: NEGATIVE
Glucose, UA: NEGATIVE
Ketones, UA: NEGATIVE
Leukocytes, UA: NEGATIVE
NITRITE UA: NEGATIVE
PH UA: 6 (ref 5.0–8.0)
PROTEIN UA: NEGATIVE
RBC UA: NEGATIVE
SPEC GRAV UA: 1.02 (ref 1.010–1.025)
UROBILINOGEN UA: 0.2 U/dL

## 2017-01-28 LAB — HEMOGLOBIN A1C: HEMOGLOBIN A1C: 8.9 % — AB (ref 4.6–6.5)

## 2017-01-28 LAB — TROPONIN I: TNIDX: 0.05 ug/l (ref 0.00–0.06)

## 2017-01-28 LAB — BRAIN NATRIURETIC PEPTIDE: PRO B NATRI PEPTIDE: 537 pg/mL — AB (ref 0.0–100.0)

## 2017-01-28 MED ORDER — SULFAMETHOXAZOLE-TRIMETHOPRIM 800-160 MG PO TABS: 1.0000 | ORAL_TABLET | Freq: Two times a day (BID) | ORAL | 0 refills | Status: DC

## 2017-01-28 MED ORDER — SPIRONOLACTONE 25 MG PO TABS: 12.5000 mg | ORAL_TABLET | Freq: Every day | ORAL | 6 refills | Status: DC

## 2017-01-28 NOTE — Progress Notes (Signed)
Pre visit review using our clinic review tool, if applicable. No additional management support is needed unless otherwise documented below in the visit note. 

## 2017-01-28 NOTE — Assessment & Plan Note (Signed)
Today in sinus rhythm. Continue eliquis.

## 2017-01-28 NOTE — Assessment & Plan Note (Signed)
Continues celexa and trazodone.  

## 2017-01-28 NOTE — Assessment & Plan Note (Addendum)
Story and exam consistent with acute prostatitis. UA today normal. UCx sent. Treat with bactrim DS 2 wk course. ?acute prostate infection triggering hypertension and dyspnea.

## 2017-01-28 NOTE — Assessment & Plan Note (Signed)
Overall euvolemic today. Check BNP

## 2017-01-28 NOTE — Assessment & Plan Note (Signed)
This limits antihypertensive medication choice some.

## 2017-01-28 NOTE — Assessment & Plan Note (Addendum)
Marked elevation today despite compliance with current regimen.  1st degree AV block with dropped beat on EKG today. May need to taper off metoprolol. For now, continue lisinopril 40mg , lasix 80mg /40mg , and toprol XL 25mg  daily, add spironolactone 12.5mg  daily. Reassess at f/u visit 1-2 wks. Labs with TnI pending.

## 2017-01-28 NOTE — Assessment & Plan Note (Signed)
With 1st degree AV block. May need to come off beta blocker.

## 2017-01-28 NOTE — Progress Notes (Signed)
BP (!) 204/104 (BP Location: Right Arm, Cuff Size: Normal)   Pulse (!) 56   Temp 97.5 F (36.4 C) (Oral)   Wt 200 lb (90.7 kg)   SpO2 97%   BMI 26.39 kg/m    CC: dyspnea Subjective:    Patient ID: TEJAS SEAWOOD, male    DOB: 03-08-1932, 83 y.o.   MRN: [id number]  HPI: KATRINA BROSH is a 83 y.o. male presenting on 01/28/2017 for Shortness of Breath   Saw cards 1.5 weeks ago, note reviewed. Stable period without dyspnea at that time. Known atrial flutter initially 2016 improved with DC cardioversion, then recurrence 10/2016 s/p successful cardioversion. Known mod-severe aortic stenosis. Continues eliquis 2.5mg  bid (5mg  dose caused hematuria). Has had difficult to control hypertension.   Here today with son endorsing swelling of feet despite lasix 80mg /40mg . Regularly taking Kdur mEq daily. Dyspnea on exertion developed over weekend - ongoing for last 5 days. Denies chest pain or tightness. Occasional dizziness. Denies palpitations or cough.   bp elevated today. No increased caffeine or salt In diet. Endorses yesterday's blood pressure ~145/95. Compliant with metoprolol 25mg  bid and lisinopril 40mg  daily.  Did not tolerate gabapentin due to worsening pain and weakness.   Oh by the way... "prostate has flared up". Feeling pressure in groin. No hematuria. Mild dysuria. Ongoing urgency, frequency. Feels he fully empties bladder. No fevers. Some chills. No nausea.   Relevant past medical, surgical, family and social history reviewed and updated as indicated. Interim medical history since our last visit reviewed. Allergies and medications reviewed and updated. Outpatient Medications Prior to Visit  Medication Sig Dispense Refill  . acetaminophen (TYLENOL) 500 MG tablet Take 1 tablet (500 mg total) by mouth 3 (three) times daily.    [name] apixaban (ELIQUIS) 2.5 MG TABS tablet Take 1 tablet (2.5 mg total) by mouth 2 (two) times daily. 60 tablet 6  . atorvastatin (LIPITOR) 20 MG tablet Take  20 mg by mouth daily.    [name] BAYER MICROLET LANCETS lancets Ck blood sugar three times daily and as directed. Dx 250.00. Insulin dependent 100 each 11  . citalopram (CELEXA) 20 MG tablet Take 1 tablet (20 mg total) by mouth daily. 30 tablet 6  . Cyanocobalamin (B-12) 500 MCG TABS Take 1 tablet by mouth daily.    . furosemide (LASIX) 40 MG tablet Take 80mg  in am and 40mg  in afternoon 90 tablet 3  . glucose blood (BAYER CONTOUR NEXT TEST) test strip Ck blood sugar three times daily and as directed. Dx 250.00 insulin dependent 100 each 11  . HYDROcodone-acetaminophen (NORCO/VICODIN) 5-325 MG tablet Take 0.5-1 tablets by mouth 2 (two) times daily as needed for moderate pain. 60 tablet 0  . Insulin Syringe-Needle U-100 (B-D INS SYR ULTRAFINE 1CC/30G) 30G X 1/2" 1 ML MISC 1 Syringe by Does not apply route as directed. 100 each 3  . Insulin Syringe-Needle U-100 (B-D INS SYRINGE 0.5CC/30GX1/2") 30G X 1/2" 0.5 ML MISC Use as directed to inject insulin. Dx: E11.65 100 each 3  . LANTUS 100 UNIT/ML injection Inject 0.35 mLs (35 Units total) into the skin at bedtime. 10 mL 6  . lisinopril (PRINIVIL,ZESTRIL) 40 MG tablet Take 1 tablet (40 mg total) by mouth daily. 90 tablet 1  . metFORMIN (GLUCOPHAGE) 1000 MG tablet Take 1 tablet (1,000 mg total) by mouth 2 (two) times daily with a meal. 180 tablet 1  . metoprolol (LOPRESSOR) 50 MG tablet Take one-half (25mg ) tablet twice a day 60 tablet 1  .  pantoprazole (PROTONIX) 20 MG tablet Take 1 tablet (20 mg total) by mouth daily. 30 tablet 6  . potassium chloride SA (K-DUR,KLOR-CON) 20 MEQ tablet Take 1 tablet (20 mEq total) by mouth daily. 30 tablet 3  . saw palmetto 160 MG capsule Take 450 mg by mouth daily.     . traZODone (DESYREL) 50 MG tablet Take 2 tablets (100 mg total) by mouth at bedtime. 180 tablet 0  . vitamin C (ASCORBIC ACID) 500 MG tablet Take 500 mg by mouth daily.    [name] gabapentin (NEURONTIN) 100 MG capsule Take 1 capsule (100 mg total) by mouth 2 (two)  times daily as needed. 60 capsule 3  . metoprolol (LOPRESSOR) 50 MG tablet Take 0.5 tablets (25 mg total) by mouth 2 (two) times daily.     No facility-administered medications prior to visit.      Per HPI unless specifically indicated in ROS section below Review of Systems     Objective:    BP (!) 204/104 (BP Location: Right Arm, Cuff Size: Normal)   Pulse (!) 56   Temp 97.5 F (36.4 C) (Oral)   Wt 200 lb (90.7 kg)   SpO2 97%   BMI 26.39 kg/m   Wt Readings from Last 3 Encounters:  01/28/17 200 lb (90.7 kg)  01/19/17 202 lb (91.6 kg)  11/16/16 195 lb (88.5 kg)    BP Readings from Last 3 Encounters:  01/28/17 (!) 204/104  01/19/17 112/66  11/16/16 (!) 156/78   Physical Exam  Constitutional: He appears well-developed and well-nourished. No distress.  HENT:  Mouth/Throat: Oropharynx is clear and moist. No oropharyngeal exudate.  Cardiovascular: Normal rate, regular rhythm and intact distal pulses.   Murmur (4/6 SEM LUSB) heard. Pulmonary/Chest: Effort normal and breath sounds normal. No respiratory distress. He has no wheezes. He has no rales.  Genitourinary: Rectum normal. Rectal exam shows no external hemorrhoid, no internal hemorrhoid, no fissure, no mass, no tenderness and anal tone normal. Prostate is enlarged and tender (boggy).  Musculoskeletal: He exhibits edema (tr dorsal feet, minimal at ankles).  Skin: Skin is warm and dry. No rash noted.  Psychiatric: He has a normal mood and affect.  Nursing note and vitals reviewed.  Results for orders placed or performed in visit on 01/28/17  POCT Urinalysis Dipstick (Automated)  Result Value Ref Range   Color, UA Yellow    Clarity, UA Clear    Glucose, UA Negative    Bilirubin, UA Negative    Ketones, UA Negative    Spec Grav, UA 1.020 1.010 - 1.025   Blood, UA Negative    pH, UA 6.0 5.0 - 8.0   Protein, UA Negative    Urobilinogen, UA 0.2 0.2 or 1.0 E.U./dL   Nitrite, UA Negative    Leukocytes, UA Negative  Negative   Lab Results  Component Value Date   HGBA1C 8.4 (H) 10/28/2016    EKG - sinus bradyacardia 1st degree AV block with dropped beat, ST depression with inverted T waves laterally, ST changes anteriorly and inferiorly, septal Q waves    Assessment & Plan:   Problem List Items Addressed This Visit    Aortic stenosis    This limits antihypertensive medication choice some.      Relevant Medications   spironolactone (ALDACTONE) 25 MG tablet   Bradycardia    With 1st degree AV block. May need to come off beta blocker.       Chronic systolic congestive heart failure ([place])  Overall euvolemic today. Check BNP      Relevant Medications   spironolactone (ALDACTONE) 25 MG tablet   Other Relevant Orders   Basic metabolic panel   Brain natriuretic peptide   CBC with Differential/Platelet   Coronary atherosclerosis   Relevant Medications   spironolactone (ALDACTONE) 25 MG tablet   Diabetes mellitus type 2, uncontrolled, with complications (HCC)   Relevant Orders   Hemoglobin A1c   Essential hypertension    Marked elevation today despite compliance with current regimen.  1st degree AV block with dropped beat on EKG today. May need to taper off metoprolol. For now, continue lisinopril 40mg , lasix 80mg /40mg , and toprol XL 25mg  daily, add spironolactone 12.5mg  daily. Reassess at f/u visit 1-2 wks. Labs with TnI pending.      Relevant Medications   spironolactone (ALDACTONE) 25 MG tablet   Exertional dyspnea    New over the last 5-7 days. No decompensation noted today, overall asymptomatic at rest, will start workup outpatient. Red flags to seek ER care reviewed.  EKG today showing NSR with 1st degree block and some ST changes, he does have hypertensive urgency. Check labs including stat TnI today to r/o emergency. Add low dose spironolactone.       Relevant Orders   EKG 12-Lead (Completed)   Troponin I   MDD (major depressive disorder), recurrent episode, moderate (HCC)     Continues celexa and trazodone.      Prostatitis - Primary    Story and exam consistent with acute prostatitis. UA today normal. UCx sent. Treat with bactrim DS 2 wk course. ?acute prostate infection triggering hypertension and dyspnea.       Relevant Orders   POCT Urinalysis Dipstick (Automated) (Completed)   Urine culture   Typical atrial flutter (HCC)    Today in sinus rhythm. Continue eliquis.       Relevant Medications   spironolactone (ALDACTONE) 25 MG tablet       Follow up plan: Return if symptoms worsen or fail to improve.  [name], MD

## 2017-01-28 NOTE — Patient Instructions (Addendum)
EKG today Urinalysis today. Labs today. For high blood pressure - start spironolactone 25mg  1/2 tablet daily.  Return in 2 weeks for follow up. If worsening symptoms, return to see cardiology.  You do have prostate infection - treat with bactrim twice daily for 2 weeks, let us know if ongoing symptoms after treatment.

## 2017-01-28 NOTE — Assessment & Plan Note (Addendum)
New over the last 5-7 days. No decompensation noted today, overall asymptomatic at rest, will start workup outpatient. Red flags to seek ER care reviewed.  EKG today showing NSR with 1st degree block and some ST changes, he does have hypertensive urgency. Check labs including stat TnI today to r/o emergency. Add low dose spironolactone.

## 2017-01-30 LAB — URINE CULTURE: ORGANISM ID, BACTERIA: NO GROWTH

## 2017-02-03 ENCOUNTER — Encounter: Payer: Self-pay | Admitting: Family Medicine

## 2017-02-03 ENCOUNTER — Ambulatory Visit (INDEPENDENT_AMBULATORY_CARE_PROVIDER_SITE_OTHER): Payer: Medicare Other | Admitting: Family Medicine

## 2017-02-03 VITALS — BP 136/80 | HR 87 | Ht 73.0 in | Wt 199.8 lb

## 2017-02-03 DIAGNOSIS — N410 Acute prostatitis: Secondary | ICD-10-CM

## 2017-02-03 DIAGNOSIS — E118 Type 2 diabetes mellitus with unspecified complications: Secondary | ICD-10-CM

## 2017-02-03 DIAGNOSIS — I483 Typical atrial flutter: Secondary | ICD-10-CM | POA: Diagnosis not present

## 2017-02-03 DIAGNOSIS — I5022 Chronic systolic (congestive) heart failure: Secondary | ICD-10-CM

## 2017-02-03 DIAGNOSIS — I350 Nonrheumatic aortic (valve) stenosis: Secondary | ICD-10-CM | POA: Diagnosis not present

## 2017-02-03 DIAGNOSIS — E1165 Type 2 diabetes mellitus with hyperglycemia: Secondary | ICD-10-CM

## 2017-02-03 DIAGNOSIS — I10 Essential (primary) hypertension: Secondary | ICD-10-CM

## 2017-02-03 DIAGNOSIS — R0609 Other forms of dyspnea: Secondary | ICD-10-CM | POA: Diagnosis not present

## 2017-02-03 DIAGNOSIS — R001 Bradycardia, unspecified: Secondary | ICD-10-CM

## 2017-02-03 DIAGNOSIS — Z794 Long term (current) use of insulin: Secondary | ICD-10-CM

## 2017-02-03 DIAGNOSIS — IMO0002 Reserved for concepts with insufficient information to code with codable children: Secondary | ICD-10-CM

## 2017-02-03 LAB — BASIC METABOLIC PANEL
BUN: 31 mg/dL — AB (ref 6–23)
CHLORIDE: 97 meq/L (ref 96–112)
CO2: 31 meq/L (ref 19–32)
Calcium: 10.2 mg/dL (ref 8.4–10.5)
Creatinine, Ser: 1.42 mg/dL (ref 0.40–1.50)
GFR: 50.68 mL/min — ABNORMAL LOW (ref >60.00)
Glucose, Bld: 168 mg/dL — ABNORMAL HIGH (ref 70–99)
POTASSIUM: 5.1 meq/L (ref 3.5–5.1)
Sodium: 136 mEq/L (ref 135–145)

## 2017-02-03 NOTE — Assessment & Plan Note (Signed)
euvolemic today. Continue lasix and spironolactone, check K/Cr

## 2017-02-03 NOTE — Patient Instructions (Addendum)
I'm glad you're doing better. Continue current medicines. Labs today - we will be in touch with results.

## 2017-02-03 NOTE — Assessment & Plan Note (Signed)
bp now stable with addition of spironolactone - continue.  Check K and Cr today.

## 2017-02-03 NOTE — Assessment & Plan Note (Signed)
Sounds regular today.  ?

## 2017-02-03 NOTE — Assessment & Plan Note (Addendum)
Reviewed A1c from last week. Anticipate hyperglycemia from prostatitis - pt endorses cbg readings improving with time, no changes at this time. Would aim for goal A1c <8% given comorbidities

## 2017-02-03 NOTE — Progress Notes (Signed)
BP 136/80   Pulse 87   Ht 6\' 1"  (1.854 m)   Wt 199 lb 12.8 oz (90.6 kg)   SpO2 96%   BMI 26.36 kg/m    CC: 1 wk f/u visit Subjective:    Patient ID: Charles [name], male    DOB: 29-Mar-1932, 83 y.o.   MRN: [id number]  HPI: Charles [name] is a 83 y.o. male presenting on 02/03/2017 for Hypertension (Pt states BP is lower since new medication)   See prior note for details.  Seen here last week with exertional dyspnea with hypertensive urgency with BP 200/100s, we added on spironolactone. At that time TnI was normal, BNP was elevated to 500s. I also was concerned for prostatitis infection given urinary symptoms - started on bactrim DS course. He is feeling better without fevers or urinary symptoms. He denies any further exertional dyspnea, chest pain, or dizziness. He states his leg swelling has significantly improved. Son states this is the best he's looked in a long time.   Current antihypertensive regimen is lisinopril 40mg  daily, lasix 80mg /40mg  daily, Toprol XL 25mg  daily, and latest addition of spironolactone 12.5mg  daily.   DM - he does check sugars fasting daily in the mornings - they had been elevated 150-180s recently (in setting of prostatitis) but he has noted slow improvement with prostate infection treatment.   Relevant past medical, surgical, family and social history reviewed and updated as indicated. Interim medical history since our last visit reviewed. Allergies and medications reviewed and updated. Outpatient Medications Prior to Visit  Medication Sig Dispense Refill  . acetaminophen (TYLENOL) 500 MG tablet Take 1 tablet (500 mg total) by mouth 3 (three) times daily.    [name] apixaban (ELIQUIS) 2.5 MG TABS tablet Take 1 tablet (2.5 mg total) by mouth 2 (two) times daily. 60 tablet 6  . atorvastatin (LIPITOR) 20 MG tablet Take 20 mg by mouth daily.    [name] BAYER MICROLET LANCETS lancets Ck blood sugar three times daily and as directed. Dx 250.00. Insulin dependent 100 each 11   . citalopram (CELEXA) 20 MG tablet Take 1 tablet (20 mg total) by mouth daily. 30 tablet 6  . Cyanocobalamin (B-12) 500 MCG TABS Take 1 tablet by mouth daily.    . furosemide (LASIX) 40 MG tablet Take 80mg  in am and 40mg  in afternoon 90 tablet 3  . glucose blood (BAYER CONTOUR NEXT TEST) test strip Ck blood sugar three times daily and as directed. Dx 250.00 insulin dependent 100 each 11  . HYDROcodone-acetaminophen (NORCO/VICODIN) 5-325 MG tablet Take 0.5-1 tablets by mouth 2 (two) times daily as needed for moderate pain. 60 tablet 0  . Insulin Syringe-Needle U-100 (B-D INS SYR ULTRAFINE 1CC/30G) 30G X 1/2" 1 ML MISC 1 Syringe by Does not apply route as directed. 100 each 3  . Insulin Syringe-Needle U-100 (B-D INS SYRINGE 0.5CC/30GX1/2") 30G X 1/2" 0.5 ML MISC Use as directed to inject insulin. Dx: E11.65 100 each 3  . LANTUS 100 UNIT/ML injection Inject 0.35 mLs (35 Units total) into the skin at bedtime. 10 mL 6  . lisinopril (PRINIVIL,ZESTRIL) 40 MG tablet Take 1 tablet (40 mg total) by mouth daily. 90 tablet 1  . metFORMIN (GLUCOPHAGE) 1000 MG tablet Take 1 tablet (1,000 mg total) by mouth 2 (two) times daily with a meal. 180 tablet 1  . metoprolol (LOPRESSOR) 50 MG tablet Take one-half (25mg ) tablet twice a day 60 tablet 1  . pantoprazole (PROTONIX) 20 MG tablet Take 1 tablet (  20 mg total) by mouth daily. 30 tablet 6  . potassium chloride SA (K-DUR,KLOR-CON) 20 MEQ tablet Take 1 tablet (20 mEq total) by mouth daily. 30 tablet 3  . saw palmetto 160 MG capsule Take 450 mg by mouth daily.     [name] spironolactone (ALDACTONE) 25 MG tablet Take 0.5 tablets (12.5 mg total) by mouth daily. 30 tablet 6  . sulfamethoxazole-trimethoprim (BACTRIM DS,SEPTRA DS) 800-160 MG tablet Take 1 tablet by mouth 2 (two) times daily. 28 tablet 0  . traZODone (DESYREL) 50 MG tablet Take 2 tablets (100 mg total) by mouth at bedtime. 180 tablet 0  . vitamin C (ASCORBIC ACID) 500 MG tablet Take 500 mg by mouth daily.      No facility-administered medications prior to visit.      Per HPI unless specifically indicated in ROS section below Review of Systems     Objective:    BP 136/80   Pulse 87   Ht 6\' 1"  (1.854 m)   Wt 199 lb 12.8 oz (90.6 kg)   SpO2 96%   BMI 26.36 kg/m   Wt Readings from Last 3 Encounters:  02/03/17 199 lb 12.8 oz (90.6 kg)  01/28/17 200 lb (90.7 kg)  01/19/17 202 lb (91.6 kg)    Physical Exam  Constitutional: He appears well-developed and well-nourished. No distress.  HENT:  Mouth/Throat: Oropharynx is clear and moist. No oropharyngeal exudate.  Cardiovascular: Normal rate, regular rhythm and intact distal pulses.   Murmur (4/6 SM at LUSB) heard. Pulmonary/Chest: Effort normal and breath sounds normal. No respiratory distress. He has no wheezes. He has no rales.  Musculoskeletal: He exhibits no edema.  Psychiatric: He has a normal mood and affect.  Nursing note and vitals reviewed.  Results for orders placed or performed in visit on 01/28/17  Urine culture  Result Value Ref Range   Organism ID, Bacteria NO GROWTH   Basic metabolic panel  Result Value Ref Range   Sodium 139 135 - 145 mEq/L   Potassium 4.3 3.5 - 5.1 mEq/L   Chloride 101 96 - 112 mEq/L   CO2 33 (H) 19 - 32 mEq/L   Glucose, Bld 176 (H) 70 - 99 mg/dL   BUN 22 6 - 23 mg/dL   Creatinine, Ser 0.94 0.40 - 1.50 mg/dL   Calcium 9.8 8.4 - 10.5 mg/dL   GFR 81.58 >60.00 mL/min  Brain natriuretic peptide  Result Value Ref Range   Pro B Natriuretic peptide (BNP) 537.0 (H) 0.0 - 100.0 pg/mL  Hemoglobin A1c  Result Value Ref Range   Hgb A1c MFr Bld 8.9 (H) 4.6 - 6.5 %  CBC with Differential/Platelet  Result Value Ref Range   WBC 6.4 4.0 - 10.5 K/uL   RBC 4.66 4.22 - 5.81 Mil/uL   Hemoglobin 13.9 13.0 - 17.0 g/dL   HCT 41.8 39.0 - 52.0 %   MCV 89.5 78.0 - 100.0 fl   MCHC 33.3 30.0 - 36.0 g/dL   RDW 14.6 11.5 - 15.5 %   Platelets 180.0 150.0 - 400.0 K/uL   Neutrophils Relative % 62.1 43.0 - 77.0 %    Lymphocytes Relative 21.0 12.0 - 46.0 %   Monocytes Relative 11.9 3.0 - 12.0 %   Eosinophils Relative 4.0 0.0 - 5.0 %   Basophils Relative 1.0 0.0 - 3.0 %   Neutro Abs 4.0 1.4 - 7.7 K/uL   Lymphs Abs 1.3 0.7 - 4.0 K/uL   Monocytes Absolute 0.8 0.1 - 1.0 K/uL  Eosinophils Absolute 0.3 0.0 - 0.7 K/uL   Basophils Absolute 0.1 0.0 - 0.1 K/uL  Troponin I  Result Value Ref Range   TNIDX 0.05 0.00 - 0.06 ug/l  POCT Urinalysis Dipstick (Automated)  Result Value Ref Range   Color, UA Yellow    Clarity, UA Clear    Glucose, UA Negative    Bilirubin, UA Negative    Ketones, UA Negative    Spec Grav, UA 1.020 1.010 - 1.025   Blood, UA Negative    pH, UA 6.0 5.0 - 8.0   Protein, UA Negative    Urobilinogen, UA 0.2 0.2 or 1.0 E.U./dL   Nitrite, UA Negative    Leukocytes, UA Negative Negative      Assessment & Plan:   Problem List Items Addressed This Visit    Aortic stenosis   Bradycardia    h/o 1st degree AV block. Sounds sinus rhythm today.       Chronic systolic congestive heart failure ([place])    euvolemic today. Continue lasix and spironolactone, check K/Cr      Relevant Orders   Basic metabolic panel   Diabetes mellitus type 2, uncontrolled, with complications ([place])    Reviewed A1c from last week. Anticipate hyperglycemia from prostatitis - pt endorses cbg readings improving with time, no changes at this time. Would aim for goal A1c <8% given comorbidities      Essential hypertension    bp now stable with addition of spironolactone - continue.  Check K and Cr today.       Exertional dyspnea    This has improved. I wonder if acute prostatitis had led to increased strain on heart leading to exertional dyspnea and hypertensive urgency. Now feeling better- rec continue spironolactone. Update labs.       Prostatitis - Primary    Overall improving. rec finish bactrim DS treatment.       Typical atrial flutter ([place])    Sounds regular today.           Follow up  plan: Return if symptoms worsen or fail to improve.  [name], MD

## 2017-02-03 NOTE — Assessment & Plan Note (Addendum)
Overall improving. rec finish bactrim DS treatment.

## 2017-02-03 NOTE — Assessment & Plan Note (Signed)
This has improved. I wonder if acute prostatitis had led to increased strain on heart leading to exertional dyspnea and hypertensive urgency. Now feeling better- rec continue spironolactone. Update labs.

## 2017-02-03 NOTE — Assessment & Plan Note (Signed)
h/o 1st degree AV block. Sounds sinus rhythm today.

## 2017-02-04 ENCOUNTER — Other Ambulatory Visit: Payer: Self-pay | Admitting: Family Medicine

## 2017-03-04 HISTORY — PX: LUMBAR EPIDURAL INJECTION: SHX1980

## 2017-03-04 NOTE — Addendum Note (Signed)
Addendum  created 03/04/17 0950 by Zyrah Wiswell D, MD   Sign clinical note    

## 2017-03-07 ENCOUNTER — Other Ambulatory Visit: Payer: Self-pay | Admitting: Family Medicine

## 2017-03-14 ENCOUNTER — Telehealth: Payer: Self-pay | Admitting: Family Medicine

## 2017-03-14 ENCOUNTER — Other Ambulatory Visit: Payer: Self-pay | Admitting: Family Medicine

## 2017-03-14 MED ORDER — SULFAMETHOXAZOLE-TRIMETHOPRIM 800-160 MG PO TABS: 1.0000 | ORAL_TABLET | Freq: Two times a day (BID) | ORAL | 0 refills | Status: DC

## 2017-03-14 NOTE — Telephone Encounter (Signed)
OV 01/28/17 mentions taper off metoprolol, 02/03/17 OV does not mention taper-pt was in rhythm.  Please advise if refill of.

## 2017-03-14 NOTE — Telephone Encounter (Signed)
May do another bactrim 2 wk course. If ongoing or recurrent symptoms, will need OV to review.

## 2017-03-14 NOTE — Telephone Encounter (Signed)
Pt seen 02/03/17 with prostate infection;symptoms cleared. Pt thinks getting another prostate infection and scott wants to know if can get refill for abx or should pt be rechecked. Scott request cb. [place] [address].

## 2017-03-15 ENCOUNTER — Telehealth: Payer: Self-pay | Admitting: Pharmacist Clinician (PhC)/ Clinical Pharmacy Specialist

## 2017-03-15 NOTE — Telephone Encounter (Signed)
Patient okay to hold Eliquis x 3 days prior to procedure.  Letter written and faxed to [place]

## 2017-03-15 NOTE — Telephone Encounter (Signed)
Pt's son notified and he verbalized understanding.

## 2017-03-15 NOTE — Telephone Encounter (Signed)
Request for surgical clearance:  1. What type of surgery is being performed? Orthopedic injection  2. When is this surgery scheduled? June 26   3. Are there any medications that need to be held prior to surgery and how long? Eliquis - requesting 3 days   4. Name of physician performing surgery? [place] [facility]  5. What is your office phone and fax number? Ph [phone]; fax [phone]

## 2017-03-29 DIAGNOSIS — M5136 Other intervertebral disc degeneration, lumbar region: Secondary | ICD-10-CM | POA: Diagnosis not present

## 2017-04-01 ENCOUNTER — Encounter: Payer: Self-pay | Admitting: Family Medicine

## 2017-04-11 ENCOUNTER — Other Ambulatory Visit: Payer: Self-pay | Admitting: Family Medicine

## 2017-05-16 ENCOUNTER — Other Ambulatory Visit: Payer: Self-pay | Admitting: Family Medicine

## 2017-05-23 ENCOUNTER — Other Ambulatory Visit: Payer: Self-pay | Admitting: Family Medicine

## 2017-06-02 ENCOUNTER — Other Ambulatory Visit: Payer: Self-pay | Admitting: Family Medicine

## 2017-06-29 ENCOUNTER — Other Ambulatory Visit: Payer: Self-pay | Admitting: Family Medicine

## 2017-07-01 ENCOUNTER — Encounter: Payer: Self-pay | Admitting: Family Medicine

## 2017-07-01 ENCOUNTER — Ambulatory Visit (INDEPENDENT_AMBULATORY_CARE_PROVIDER_SITE_OTHER): Payer: Medicare Other | Admitting: Family Medicine

## 2017-07-01 VITALS — BP 136/80 | HR 66 | Temp 97.6°F | Wt 195.5 lb

## 2017-07-01 DIAGNOSIS — N289 Disorder of kidney and ureter, unspecified: Secondary | ICD-10-CM

## 2017-07-01 DIAGNOSIS — I10 Essential (primary) hypertension: Secondary | ICD-10-CM | POA: Diagnosis not present

## 2017-07-01 DIAGNOSIS — E1165 Type 2 diabetes mellitus with hyperglycemia: Secondary | ICD-10-CM | POA: Diagnosis not present

## 2017-07-01 DIAGNOSIS — N62 Hypertrophy of breast: Secondary | ICD-10-CM | POA: Diagnosis not present

## 2017-07-01 DIAGNOSIS — Z794 Long term (current) use of insulin: Secondary | ICD-10-CM | POA: Diagnosis not present

## 2017-07-01 DIAGNOSIS — IMO0002 Reserved for concepts with insufficient information to code with codable children: Secondary | ICD-10-CM

## 2017-07-01 DIAGNOSIS — R3915 Urgency of urination: Secondary | ICD-10-CM

## 2017-07-01 DIAGNOSIS — N400 Enlarged prostate without lower urinary tract symptoms: Secondary | ICD-10-CM | POA: Insufficient documentation

## 2017-07-01 DIAGNOSIS — E118 Type 2 diabetes mellitus with unspecified complications: Secondary | ICD-10-CM | POA: Diagnosis not present

## 2017-07-01 LAB — RENAL FUNCTION PANEL
ALBUMIN: 4.1 g/dL (ref 3.5–5.2)
BUN: 23 mg/dL (ref 6–23)
CHLORIDE: 97 meq/L (ref 96–112)
CO2: 34 meq/L — AB (ref 19–32)
CREATININE: 0.99 mg/dL (ref 0.40–1.50)
Calcium: 10 mg/dL (ref 8.4–10.5)
GFR: 76.76 mL/min (ref >60.00)
Glucose, Bld: 110 mg/dL — ABNORMAL HIGH (ref 70–99)
Phosphorus: 3.5 mg/dL (ref 2.3–4.6)
Potassium: 4.2 mEq/L (ref 3.5–5.1)
Sodium: 137 mEq/L (ref 135–145)

## 2017-07-01 LAB — HEMOGLOBIN A1C: Hgb A1c MFr Bld: 8.1 % — ABNORMAL HIGH (ref 4.6–6.5)

## 2017-07-01 MED ORDER — SPIRONOLACTONE 25 MG PO TABS: ORAL_TABLET | ORAL | 0 refills | Status: DC

## 2017-07-01 MED ORDER — TAMSULOSIN HCL 0.4 MG PO CAPS: 0.4000 mg | ORAL_CAPSULE | Freq: Every day | ORAL | 6 refills | Status: DC

## 2017-07-01 NOTE — Assessment & Plan Note (Addendum)
Central gynecomastia noted on exam today. Timing correlates with commencement of spironolactone - will discontinue this medication and monitor for improvement.

## 2017-07-01 NOTE — Assessment & Plan Note (Signed)
Chronic, stable. Update renal panel today.

## 2017-07-01 NOTE — Assessment & Plan Note (Signed)
Noted last labs. Update labwork today. Will also stop spironolactone (see above). Reviewed lasix dosing. He had been taking 80mg  bid, recommended return to 80/40 dose.[name]

## 2017-07-01 NOTE — Assessment & Plan Note (Signed)
Anticipate some symptoms related to enlarged prostate - will trial flomax nightly for these symptoms.

## 2017-07-01 NOTE — Progress Notes (Signed)
BP 136/80 (BP Location: Left Arm, Patient Position: Sitting, Cuff Size: Normal)   Pulse 66   Temp 97.6 F (36.4 C) (Oral)   Wt 195 lb 8 oz (88.7 kg)   SpO2 91%   BMI 25.79 kg/m    CC: back pain, L nipple tenderness Subjective:    Patient ID: RINGO SHEROD, male    DOB: [birth year], 83 y.o.   MRN: [id number]  HPI: YASIEL GOYNE is a 83 y.o. male presenting on 07/01/2017 for Check kidneys and Breast Problem (Left nipple tenderness)   Recent prostatitis x2 s/p bactrim treatment.  Spironolactone 12.5mg  daily PRN was added 02/2017.   6-8 wk h/o L nipple soreness.  Nocturia x1 at night, then first thing in am with sense of urgency and incomplete emptying. No dysuria or hematuria, fevers/chills, nausea or flank pain.  Ongoing bilateral hip pain - shots did significantly help back pain (s/p 3 injections).   Current antihypertensive regimen is lisinopril 40mg  daily, lasix 80mg /40mg  daily, Toprol XL 25mg  daily, and latest addition of spironolactone 12.5mg  daily.   Relevant past medical, surgical, family and social history reviewed and updated as indicated. Interim medical history since our last visit reviewed. Allergies and medications reviewed and updated. Outpatient Medications Prior to Visit  Medication Sig Dispense Refill  . ACCU-CHEK AVIVA PLUS test strip CHECK BLOOD SUGAR THREE TIMES A DAY AND AS DIRECTED. 100 each 6  . acetaminophen (TYLENOL) 500 MG tablet Take 1 tablet (500 mg total) by mouth 3 (three) times daily.    [name] apixaban (ELIQUIS) 2.5 MG TABS tablet Take 1 tablet (2.5 mg total) by mouth 2 (two) times daily. 60 tablet 6  . atorvastatin (LIPITOR) 20 MG tablet TAKE 1 TABLET DAILY. 90 tablet 0  . BAYER MICROLET LANCETS lancets Ck blood sugar three times daily and as directed. Dx 250.00. Insulin dependent 100 each 11  . citalopram (CELEXA) 20 MG tablet Take 1 tablet (20 mg total) by mouth daily. 90 tablet 1  . Cyanocobalamin (B-12) 500 MCG TABS Take 1 tablet by mouth daily.     . furosemide (LASIX) 40 MG tablet Take 80mg  in am and 40mg  in afternoon 90 tablet 3  . HYDROcodone-acetaminophen (NORCO/VICODIN) 5-325 MG tablet Take 0.5-1 tablets by mouth 2 (two) times daily as needed for moderate pain. 60 tablet 0  . Insulin Syringe-Needle U-100 (B-D INS SYR ULTRAFINE 1CC/30G) 30G X 1/2" 1 ML MISC 1 Syringe by Does not apply route as directed. 100 each 3  . Insulin Syringe-Needle U-100 (B-D INS SYRINGE 0.5CC/30GX1/2") 30G X 1/2" 0.5 ML MISC Use as directed to inject insulin. Dx: E11.65 100 each 3  . LANTUS 100 UNIT/ML injection Inject 0.35 mLs (35 Units total) into the skin at bedtime. 10 mL 6  . lisinopril (PRINIVIL,ZESTRIL) 40 MG tablet Take 1 tablet (40 mg total) by mouth daily. 90 tablet 1  . metFORMIN (GLUCOPHAGE) 1000 MG tablet Take 1 tablet (1,000 mg total) by mouth 2 (two) times daily with a meal. 180 tablet 1  . metoprolol tartrate (LOPRESSOR) 50 MG tablet Take one-half (25mg ) tablet twice a day 60 tablet 3  . pantoprazole (PROTONIX) 20 MG tablet Take 1 tablet (20 mg total) by mouth daily. 30 tablet 6  . potassium chloride SA (K-DUR,KLOR-CON) 20 MEQ tablet Take 1 tablet (20 mEq total) by mouth daily. Hold potassium on days you need spironolactone    . saw palmetto 160 MG capsule Take 450 mg by mouth daily.     . traZODone (  DESYREL) 50 MG tablet Take 2 tablets (100 mg total) by mouth at bedtime. 180 tablet 0  . vitamin C (ASCORBIC ACID) 500 MG tablet Take 500 mg by mouth daily.    [name] spironolactone (ALDACTONE) 25 MG tablet Take 0.5 tablets (12.5 mg total) by mouth daily as needed (leg swelling).    [name] sulfamethoxazole-trimethoprim (BACTRIM DS,SEPTRA DS) 800-160 MG tablet Take 1 tablet by mouth 2 (two) times daily. 28 tablet 0   No facility-administered medications prior to visit.      Per HPI unless specifically indicated in ROS section below Review of Systems     Objective:    BP 136/80 (BP Location: Left Arm, Patient Position: Sitting, Cuff Size: Normal)    Pulse 66   Temp 97.6 F (36.4 C) (Oral)   Wt 195 lb 8 oz (88.7 kg)   SpO2 91%   BMI 25.79 kg/m   Wt Readings from Last 3 Encounters:  07/01/17 195 lb 8 oz (88.7 kg)  02/03/17 199 lb 12.8 oz (90.6 kg)  01/28/17 200 lb (90.7 kg)    Physical Exam  Constitutional: He appears well-developed and well-nourished. No distress.  Cardiovascular: Normal rate, regular rhythm and intact distal pulses.   Murmur (4/6 SEM) heard. Pulmonary/Chest: Effort normal and breath sounds normal. No respiratory distress. He has no wheezes. He has no rales.  Soreness and swelling of L nipple with some palpable breast tissue central and deep to areola  Nursing note and vitals reviewed.  Results for orders placed or performed in visit on 85/27/78  Basic metabolic panel  Result Value Ref Range   Sodium 136 135 - 145 mEq/L   Potassium 5.1 3.5 - 5.1 mEq/L   Chloride 97 96 - 112 mEq/L   CO2 31 19 - 32 mEq/L   Glucose, Bld 168 (H) 70 - 99 mg/dL   BUN 31 (H) 6 - 23 mg/dL   Creatinine, Ser 1.42 0.40 - 1.50 mg/dL   Calcium 10.2 8.4 - 10.5 mg/dL   GFR 50.68 (L) >60.00 mL/min      Assessment & Plan:  Declines flu shot today due to head congestion.  Problem List Items Addressed This Visit    Diabetes mellitus type 2, uncontrolled, with complications ([place])   Relevant Orders   Hemoglobin A1c   Essential hypertension    Chronic, stable. Update renal panel today.       Gynecomastia, male - Primary    Central gynecomastia noted on exam today. Timing correlates with commencement of spironolactone - will discontinue this medication and monitor for improvement.       Renal insufficiency    Noted last labs. Update labwork today. Will also stop spironolactone (see above). Reviewed lasix dosing. He had been taking 80mg  bid, recommended return to 80/40 dose..       Relevant Orders   Renal function panel   Urinary urgency    Anticipate some symptoms related to enlarged prostate - will trial flomax nightly for these  symptoms.           Follow up plan: Return in about 3 months (around 09/30/2017) for annual exam, prior fasting for blood work, medicare wellness visit.  [name], MD

## 2017-07-01 NOTE — Patient Instructions (Addendum)
I think the nipple swelling and soreness is coming from spironolactone - common side effect. Stop medicine, monitor for improvement over 1-2 weeks. If ongoing discomfort, let me know. Trial flomax for night time urination - take 1 tablet at bedtime.  Labs today. Furosemide dose is 2 tablets in the morning, 1 in the afternoon.  Schedule medicare wellness visit and physical in 3 months.

## 2017-07-07 ENCOUNTER — Other Ambulatory Visit: Payer: Self-pay | Admitting: Family Medicine

## 2017-07-08 ENCOUNTER — Other Ambulatory Visit: Payer: Self-pay | Admitting: Family Medicine

## 2017-07-14 ENCOUNTER — Encounter: Payer: Self-pay | Admitting: Family Medicine

## 2017-07-14 ENCOUNTER — Ambulatory Visit (INDEPENDENT_AMBULATORY_CARE_PROVIDER_SITE_OTHER): Payer: Medicare Other | Admitting: Family Medicine

## 2017-07-14 VITALS — BP 128/68 | HR 54 | Temp 97.6°F | Wt 205.0 lb

## 2017-07-14 DIAGNOSIS — B9689 Other specified bacterial agents as the cause of diseases classified elsewhere: Secondary | ICD-10-CM | POA: Diagnosis not present

## 2017-07-14 DIAGNOSIS — I5022 Chronic systolic (congestive) heart failure: Secondary | ICD-10-CM | POA: Diagnosis not present

## 2017-07-14 DIAGNOSIS — N62 Hypertrophy of breast: Secondary | ICD-10-CM

## 2017-07-14 DIAGNOSIS — J019 Acute sinusitis, unspecified: Secondary | ICD-10-CM

## 2017-07-14 MED ORDER — FUROSEMIDE 40 MG PO TABS: ORAL_TABLET | ORAL | 11 refills | Status: DC

## 2017-07-14 MED ORDER — AMOXICILLIN-POT CLAVULANATE 875-125 MG PO TABS: 1.0000 | ORAL_TABLET | Freq: Two times a day (BID) | ORAL | 0 refills | Status: AC

## 2017-07-14 NOTE — Assessment & Plan Note (Signed)
This has resolved off spironolactone

## 2017-07-14 NOTE — Patient Instructions (Signed)
Change lasix to 2 in the morning always, and 1-2 in afternoon as needed for leg swelling.  You have a sinus infection, likely bacterial. Take medicine as prescribed: augmentin 10 day course. Push fluids and plenty of rest. Nasal saline irrigation or neti pot to help drain sinuses. May use plain mucinex with plenty of fluid to help mobilize mucous. Please let us know if fever >101.5, trouble opening/closing mouth, difficulty swallowing, or worsening instead of improving as expected.

## 2017-07-14 NOTE — Assessment & Plan Note (Signed)
Seems euvolemic today. Endorses increased pedal edema with lower lasix dose - will change back to 80mg  daily in am and option to increase 40-80mg  in PM PRN leg swelling. Pt agrees with plan.

## 2017-07-14 NOTE — Assessment & Plan Note (Signed)
Anticipate bacterial given progression and duration of symptoms.  Treat with augmentin course. Further supportive care per instructions.  Update if not improving with treatment. Pt and son agree with plan.

## 2017-07-14 NOTE — Progress Notes (Signed)
BP 128/68 (BP Location: Left Arm, Patient Position: Sitting, Cuff Size: Normal)   Pulse (!) 54   Temp 97.6 F (36.4 C) (Oral)   Wt 205 lb (93 kg)   SpO2 95%   BMI 27.05 kg/m    CC: head congestion Subjective:    Patient ID: Charles [name], male    DOB: 08/15/1932, 83 y.o.   MRN: [id number]  HPI: Charles [name] is a 83 y.o. male presenting on 07/14/2017 for Sinus Problem (Started about 2 wks ago. Has not taken anything.); Nasal Congestion; and Discuss medication (Reports swelling in ankles since decreasing furosemide)   2 wk h/o sinus and head congestion with decreased hearing despite hearing aides, facial pain. Mild cough + PNDrainage and rhinorrhea.   No fevers, ear or tooth pain, ST, dyspnea or wheezing. No abd pain or nausea.  Some painful twinges in chest. No palpitations or dyspnea.  No sick contacts at home.  Not around smokers Hasn't tried anything for this yet.   Painful gynecomastia last visit so we stopped spironolactone. This has imroved.  Prior on lasix 80mg  bid, we decreased to 80/40 last visit - notes increase in leg swelling without increased dyspnea. Legs feel tight.   Relevant past medical, surgical, family and social history reviewed and updated as indicated. Interim medical history since our last visit reviewed. Allergies and medications reviewed and updated. Outpatient Medications Prior to Visit  Medication Sig Dispense Refill  . ACCU-CHEK AVIVA PLUS test strip CHECK BLOOD SUGAR THREE TIMES A DAY AND AS DIRECTED. 100 each 6  . acetaminophen (TYLENOL) 500 MG tablet Take 1 tablet (500 mg total) by mouth 3 (three) times daily.    [name] apixaban (ELIQUIS) 2.5 MG TABS tablet Take 1 tablet (2.5 mg total) by mouth 2 (two) times daily. 60 tablet 6  . atorvastatin (LIPITOR) 20 MG tablet TAKE 1 TABLET DAILY. 90 tablet 0  . BAYER MICROLET LANCETS lancets Ck blood sugar three times daily and as directed. Dx 250.00. Insulin dependent 100 each 11  . citalopram (CELEXA) 20  MG tablet Take 1 tablet (20 mg total) by mouth daily. 90 tablet 1  . Cyanocobalamin (B-12) 500 MCG TABS Take 1 tablet by mouth daily.    [name] HYDROcodone-acetaminophen (NORCO/VICODIN) 5-325 MG tablet Take 0.5-1 tablets by mouth 2 (two) times daily as needed for moderate pain. 60 tablet 0  . Insulin Syringe-Needle U-100 (B-D INS SYR ULTRAFINE 1CC/30G) 30G X 1/2" 1 ML MISC 1 Syringe by Does not apply route as directed. 100 each 3  . Insulin Syringe-Needle U-100 (B-D INS SYRINGE 0.5CC/30GX1/2") 30G X 1/2" 0.5 ML MISC Use as directed to inject insulin. Dx: E11.65 100 each 3  . LANTUS 100 UNIT/ML injection Inject 0.35 mLs (35 Units total) into the skin at bedtime. 10 mL 6  . lisinopril (PRINIVIL,ZESTRIL) 40 MG tablet Take 1 tablet (40 mg total) by mouth daily. 90 tablet 1  . metFORMIN (GLUCOPHAGE) 1000 MG tablet Take 1 tablet (1,000 mg total) by mouth 2 (two) times daily with a meal. 180 tablet 1  . metoprolol tartrate (LOPRESSOR) 50 MG tablet Take one-half (25mg ) tablet twice a day 60 tablet 3  . pantoprazole (PROTONIX) 20 MG tablet Take 1 tablet (20 mg total) by mouth daily. 30 tablet 6  . potassium chloride SA (K-DUR,KLOR-CON) 20 MEQ tablet Take 1 tablet (20 mEq total) by mouth daily. Hold potassium on days you need spironolactone    . saw palmetto 160 MG capsule Take 450 mg by  mouth daily.     . tamsulosin (FLOMAX) 0.4 MG CAPS capsule Take 1 capsule (0.4 mg total) by mouth daily after supper. 30 capsule 6  . traZODone (DESYREL) 50 MG tablet Take 2 tablets (100 mg total) by mouth at bedtime. 180 tablet 0  . vitamin C (ASCORBIC ACID) 500 MG tablet Take 500 mg by mouth daily.    . furosemide (LASIX) 40 MG tablet Take 80mg  in am and 40mg  in afternoon 90 tablet 3   No facility-administered medications prior to visit.      Per HPI unless specifically indicated in ROS section below Review of Systems     Objective:    BP 128/68 (BP Location: Left Arm, Patient Position: Sitting, Cuff Size: Normal)    Pulse (!) 54   Temp 97.6 F (36.4 C) (Oral)   Wt 205 lb (93 kg)   SpO2 95%   BMI 27.05 kg/m   Wt Readings from Last 3 Encounters:  07/14/17 205 lb (93 kg)  07/01/17 195 lb 8 oz (88.7 kg)  02/03/17 199 lb 12.8 oz (90.6 kg)    Physical Exam  Constitutional: He appears well-developed and well-nourished. No distress.  HENT:  Head: Normocephalic and atraumatic.  Right Ear: Tympanic membrane, external ear and ear canal normal. Decreased hearing is noted.  Left Ear: Tympanic membrane, external ear and ear canal normal. Decreased hearing is noted.  Nose: Mucosal edema present. No rhinorrhea. Right sinus exhibits maxillary sinus tenderness and frontal sinus tenderness. Left sinus exhibits maxillary sinus tenderness and frontal sinus tenderness.  Mouth/Throat: Uvula is midline, oropharynx is clear and moist and mucous membranes are normal. No oropharyngeal exudate, posterior oropharyngeal edema, posterior oropharyngeal erythema or tonsillar abscesses.  Eyes: Pupils are equal, round, and reactive to light. Conjunctivae and EOM are normal. No scleral icterus.  Neck: Normal range of motion. Neck supple.  Cardiovascular: Normal rate, regular rhythm and intact distal pulses.   Murmur (3/6 systolic) heard. Pulmonary/Chest: Effort normal and breath sounds normal. No respiratory distress. He has no wheezes. He has no rales.  Lungs overall clear  Musculoskeletal: He exhibits edema (tight trace edema, nonpitting).  Lymphadenopathy:    He has no cervical adenopathy.  Skin: Skin is warm and dry. No rash noted.  Nursing note and vitals reviewed.  Results for orders placed or performed in visit on 07/01/17  Hemoglobin A1c  Result Value Ref Range   Hgb A1c MFr Bld 8.1 (H) 4.6 - 6.5 %  Renal function panel  Result Value Ref Range   Sodium 137 135 - 145 mEq/L   Potassium 4.2 3.5 - 5.1 mEq/L   Chloride 97 96 - 112 mEq/L   CO2 34 (H) 19 - 32 mEq/L   Calcium 10.0 8.4 - 10.5 mg/dL   Albumin 4.1 3.5 - 5.2  g/dL   BUN 23 6 - 23 mg/dL   Creatinine, Ser 0.99 0.40 - 1.50 mg/dL   Glucose, Bld 110 (H) 70 - 99 mg/dL   Phosphorus 3.5 2.3 - 4.6 mg/dL   GFR 76.76 >60.00 mL/min      Assessment & Plan:   Problem List Items Addressed This Visit    Acute bacterial sinusitis - Primary    Anticipate bacterial given progression and duration of symptoms.  Treat with augmentin course. Further supportive care per instructions.  Update if not improving with treatment. Pt and son agree with plan.       Relevant Medications   amoxicillin-clavulanate (AUGMENTIN) 875-125 MG tablet   Chronic systolic congestive  heart failure (HCC)    Seems euvolemic today. Endorses increased pedal edema with lower lasix dose - will change back to 80mg  daily in am and option to increase 40-80mg  in PM PRN leg swelling. Pt agrees with plan.       Relevant Medications   ELIQUIS 5 MG TABS tablet   furosemide (LASIX) 40 MG tablet   RESOLVED: Gynecomastia, male    This has resolved off spironolactone          Follow up plan: No Follow-up on file.  [name], MD

## 2017-07-22 ENCOUNTER — Other Ambulatory Visit (HOSPITAL_COMMUNITY): Payer: Medicare Other

## 2017-07-27 ENCOUNTER — Other Ambulatory Visit (HOSPITAL_COMMUNITY): Payer: Medicare Other

## 2017-07-29 ENCOUNTER — Other Ambulatory Visit (HOSPITAL_COMMUNITY): Payer: Medicare Other

## 2017-08-01 ENCOUNTER — Other Ambulatory Visit: Payer: Self-pay | Admitting: Family Medicine

## 2017-08-04 ENCOUNTER — Ambulatory Visit: Payer: Self-pay | Admitting: *Deleted

## 2017-08-04 NOTE — Telephone Encounter (Signed)
Charles GibbPt was seen 2 weeks ago and given an antibiotic for URI.  He is no better and wants to know if the dr can call in another antibiotic. No fever. (Son [name] called in for father). (Routing comment)  s RN also noted;

## 2017-08-04 NOTE — Telephone Encounter (Signed)
Patient says he cannot take Claritin because it makes his legs hurt but he will use the Flonase 2 sprays per nostril each day and call if he isn't getting better.

## 2017-08-04 NOTE — Telephone Encounter (Signed)
Pt's son Charles [name] called in regarding his father is not feeling any better in spite of completing his antibiotics.   Father is still having a runny nose without fever.    I routed a note to Dr. [name] for further instructions.   Informed son that they should expect a call back from the office this evening. Reason for Disposition . [1] Nasal discharge AND [2] present > 10 days  Answer Assessment - Initial Assessment Questions 1. ONSET: "When did the nasal discharge start?"      About 3 weeks ago.   (Son Charles [name] called in for father)  He saw the dr 2 weeks ago and has completed the antibiotics that were prescribed at that visit. 2. AMOUNT: "How much discharge is there?"      Nose is running.  Having to blow his nose and congested still. 3. COUGH: "Do you have a cough?" If yes, ask: "Describe the color of your sputum" (clear, white, yellow, green)     No 4. RESPIRATORY DISTRESS: "Describe your breathing."     None 5. FEVER: "Do you have a fever?" If so, ask: "What is your temperature, how was it measured, and when did it start?"     Denies having a fever 6. SEVERITY: "Overall, how bad are you feeling right now?" (e.g., doesn't interfere with normal activities, staying home from school/work, staying in bed)      My father is not feeling or doing any better even after completing the antibiotics. 7. OTHER SYMPTOMS: "Do you have any other symptoms?" (e.g., sore throat, earache, wheezing, vomiting)     Some coughing 8. PREGNANCY: "Is there any chance you are pregnant?" "When was your last menstrual period?"     *No Answer*  Protocols used: COMMON COLD-A-AH

## 2017-08-04 NOTE — Telephone Encounter (Signed)
Noted. Thanks.

## 2017-08-04 NOTE — Telephone Encounter (Signed)
Can he tolerate loratadine 10mg  a day? Can he tolerate flonase 2 sprays per nostril per day? If so, then would try those next.   If he still has facial pain and/or doesn't get better with those then let me know.   Thanks.

## 2017-08-08 ENCOUNTER — Ambulatory Visit (HOSPITAL_COMMUNITY): Payer: Medicare Other | Attending: Cardiology

## 2017-08-08 ENCOUNTER — Other Ambulatory Visit: Payer: Self-pay | Admitting: Family Medicine

## 2017-08-08 ENCOUNTER — Other Ambulatory Visit: Payer: Self-pay

## 2017-08-08 DIAGNOSIS — I2510 Atherosclerotic heart disease of native coronary artery without angina pectoris: Secondary | ICD-10-CM | POA: Insufficient documentation

## 2017-08-08 DIAGNOSIS — E785 Hyperlipidemia, unspecified: Secondary | ICD-10-CM | POA: Diagnosis not present

## 2017-08-08 DIAGNOSIS — Z87891 Personal history of nicotine dependence: Secondary | ICD-10-CM | POA: Diagnosis not present

## 2017-08-08 DIAGNOSIS — I10 Essential (primary) hypertension: Secondary | ICD-10-CM | POA: Insufficient documentation

## 2017-08-08 DIAGNOSIS — I350 Nonrheumatic aortic (valve) stenosis: Secondary | ICD-10-CM | POA: Diagnosis not present

## 2017-08-08 DIAGNOSIS — I080 Rheumatic disorders of both mitral and aortic valves: Secondary | ICD-10-CM | POA: Diagnosis not present

## 2017-08-11 ENCOUNTER — Other Ambulatory Visit: Payer: Self-pay | Admitting: Cardiovascular Disease

## 2017-08-11 DIAGNOSIS — I350 Nonrheumatic aortic (valve) stenosis: Secondary | ICD-10-CM

## 2017-08-24 ENCOUNTER — Ambulatory Visit (INDEPENDENT_AMBULATORY_CARE_PROVIDER_SITE_OTHER): Payer: Medicare Other | Admitting: Internal Medicine

## 2017-08-24 ENCOUNTER — Encounter: Payer: Self-pay | Admitting: Internal Medicine

## 2017-08-24 VITALS — BP 158/78 | HR 59 | Temp 97.8°F | Wt 213.0 lb

## 2017-08-24 DIAGNOSIS — I2510 Atherosclerotic heart disease of native coronary artery without angina pectoris: Secondary | ICD-10-CM

## 2017-08-24 DIAGNOSIS — R609 Edema, unspecified: Secondary | ICD-10-CM | POA: Diagnosis not present

## 2017-08-24 DIAGNOSIS — E118 Type 2 diabetes mellitus with unspecified complications: Secondary | ICD-10-CM

## 2017-08-24 DIAGNOSIS — I350 Nonrheumatic aortic (valve) stenosis: Secondary | ICD-10-CM

## 2017-08-24 DIAGNOSIS — Z794 Long term (current) use of insulin: Secondary | ICD-10-CM

## 2017-08-24 DIAGNOSIS — E1165 Type 2 diabetes mellitus with hyperglycemia: Secondary | ICD-10-CM

## 2017-08-24 DIAGNOSIS — Z23 Encounter for immunization: Secondary | ICD-10-CM | POA: Diagnosis not present

## 2017-08-24 DIAGNOSIS — R635 Abnormal weight gain: Secondary | ICD-10-CM | POA: Diagnosis not present

## 2017-08-24 DIAGNOSIS — IMO0002 Reserved for concepts with insufficient information to code with codable children: Secondary | ICD-10-CM

## 2017-08-24 MED ORDER — FUROSEMIDE 40 MG PO TABS: ORAL_TABLET | ORAL | 1 refills | Status: DC

## 2017-08-24 NOTE — Progress Notes (Signed)
Chief Complaint  Patient presents with  . Edema    HPI: Charles [name] 83 y.o. come in  To [facility]  for acute same day appt      pcp  [facility]  appt NA Says ankles more swollen over past 3 days  But ongoing for months  Has underlying vascular disease cabg and chf poss related to AFF  by  Hx review  His is also diabetic  Last seen  By his cardiology dr [name] in April 18 and planned for 6 mo fu and echo  For AS  Seen for  Edema  February and given lasix 80 bid for 3 d and then 80 40  Not sure it helped and has been battling swelling   For a few months but last 3 days may have ben worse   Denies cp sob changes  Falling  Change in urinary function ( frequency)  Not sure if can tell if lasix  Working.   Recent  rx for prostatisi and sinusitis  With antibiotics   ROS: See pertinent positives and negatives per HPI.still grieving  Death of his wife 2 years ago   Family supportive and drives him to appts   Denies fever chills feet pain   No syncope  Says bp at home 138 range  Past Medical History:  Diagnosis Date  . Abdominal aortic atherosclerosis ([place])    by xray  . Aortic stenosis 06/2014   moderate-severe  . Arthritis    in lower back (06/24/2015)  . Atrial flutter ([place])    notes 06/24/2015  . 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 CAD (coronary artery disease) 2004   a. s/p CABG in 2004 with LIMA-LAD, SVG-RCA, SVG-OM, and SVG-RI  . Carotid stenosis 1999   s/p L CEA  . CHF (congestive heart failure) ([place]) dx'd 06/2015   a. EF 55-60% by echo in 2016. b. 10/2016: EF reduced to 30-35% by TEE in the setting of atrial fibrillation with RVR  . Colon polyp 2005   (Dr. Tiffany Kocher)  . Compression fracture of L1 lumbar vertebra (HCC) remote  . Compression fracture of L1 lumbar vertebra (HCC) 02/2016   chronic by xray  . COPD (chronic obstructive pulmonary disease) ([place]) 03/2011   by xray  . DDD (degenerative disc disease), lumbar 2014   severe L2/3  L5/S1 by xray  . Depression    hx  . Ex-smoker   . GERD (gastroesophageal reflux disease) 2003   h/o duodenal ulcer per EGD as well as esophagitis  . History of chicken pox   . History of colon polyps 2003, 2005   adenomatous [facility])  . HLD (hyperlipidemia)   . HTN (hypertension)   . Psoriasis   . Renal artery stenosis ([place]) 2004   70% bilateral, followed by cards  . Seasonal allergies   . T2DM (type 2 diabetes mellitus) ([place]) 1995  . Urge incontinence of urine     Family History  Problem Relation Age of Onset  . Hypertension Father   . Heart disease Father   . Cancer Mother        GI cancer  . Diabetes Sister   . Heart disease Sister   . Stroke Sister   . Hypertension Sister   . Cancer Brother        Lung cancer - nonsmoker  . Cancer Maternal Grandmother   . Heart attack Maternal Grandfather   . Heart attack Paternal Grandmother   .  Heart attack Paternal Grandfather   . Cancer Brother        Pancreatic cancer  . Lung disease Brother     Social History   Socioeconomic History  . Marital status: Married    Spouse name: Not on file  . Number of children: Not on file  . Years of education: Not on file  . Highest education level: Not on file  Social Needs  . Financial resource strain: Not on file  . Food insecurity - worry: Not on file  . Food insecurity - inability: Not on file  . Transportation needs - medical: Not on file  . Transportation needs - non-medical: Not on file  Occupational History  . Not on file  Tobacco Use  . Smoking status: Former Smoker    Packs/day: 0.50    Years: 36.00    Pack years: 18.00    Types: Cigarettes    Last attempt to quit: 07/23/2013    Years since quitting: 4.0  . Smokeless tobacco: Never Used  Substance and Sexual Activity  . Alcohol use: No  . Drug use: No  . Sexual activity: Not Currently  Other Topics Concern  . Not on file  Social History Narrative   Caffeine: 4 cups coffee/day, some tea and soda   Lives  with wife   Occupation: Retired   Activity: no regular exercise   Diet: good water, fruits/vegetables daily    Outpatient Medications Prior to Visit  Medication Sig Dispense Refill  . ACCU-CHEK AVIVA PLUS test strip CHECK BLOOD SUGAR THREE TIMES A DAY AND AS DIRECTED. 100 each 6  . acetaminophen (TYLENOL) 500 MG tablet Take 1 tablet (500 mg total) by mouth 3 (three) times daily.    [name] apixaban (ELIQUIS) 2.5 MG TABS tablet Take 1 tablet (2.5 mg total) by mouth 2 (two) times daily. 60 tablet 6  . atorvastatin (LIPITOR) 20 MG tablet TAKE 1 TABLET DAILY. 90 tablet 0  . BAYER MICROLET LANCETS lancets Ck blood sugar three times daily and as directed. Dx 250.00. Insulin dependent 100 each 11  . citalopram (CELEXA) 20 MG tablet Take 1 tablet (20 mg total) by mouth daily. 90 tablet 1  . Cyanocobalamin (B-12) 500 MCG TABS Take 1 tablet by mouth daily.    [name] ELIQUIS 5 MG TABS tablet Take 1 tablet by mouth 2 (two) times daily.    [name] ELIQUIS 5 MG TABS tablet Take 1 tablet (5 mg total) by mouth 2 (two) times daily. 60 tablet 2  . HYDROcodone-acetaminophen (NORCO/VICODIN) 5-325 MG tablet Take 0.5-1 tablets by mouth 2 (two) times daily as needed for moderate pain. 60 tablet 0  . Insulin Syringe-Needle U-100 (B-D INS SYR ULTRAFINE 1CC/30G) 30G X 1/2" 1 ML MISC 1 Syringe by Does not apply route as directed. 100 each 3  . Insulin Syringe-Needle U-100 (B-D INS SYRINGE 0.5CC/30GX1/2") 30G X 1/2" 0.5 ML MISC Use as directed to inject insulin. Dx: E11.65 100 each 3  . LANTUS 100 UNIT/ML injection Inject 0.35 mLs (35 Units total) into the skin at bedtime. 10 mL 2  . lisinopril (PRINIVIL,ZESTRIL) 40 MG tablet Take 1 tablet (40 mg total) by mouth daily. 90 tablet 1  . metFORMIN (GLUCOPHAGE) 1000 MG tablet Take 1 tablet (1,000 mg total) by mouth 2 (two) times daily with a meal. 180 tablet 1  . metoprolol tartrate (LOPRESSOR) 50 MG tablet Take one-half (25mg ) tablet twice a day 60 tablet 3  . pantoprazole (PROTONIX) 20 MG  tablet Take 1  tablet (20 mg total) by mouth daily. 30 tablet 6  . potassium chloride SA (K-DUR,KLOR-CON) 20 MEQ tablet Take 1 tablet (20 mEq total) by mouth daily. Hold potassium on days you need spironolactone    . saw palmetto 160 MG capsule Take 450 mg by mouth daily.     . tamsulosin (FLOMAX) 0.4 MG CAPS capsule Take 1 capsule (0.4 mg total) by mouth daily after supper. 30 capsule 6  . traZODone (DESYREL) 50 MG tablet Take 2 tablets (100 mg total) by mouth at bedtime. 180 tablet 0  . vitamin C (ASCORBIC ACID) 500 MG tablet Take 500 mg by mouth daily.    . furosemide (LASIX) 40 MG tablet Take 80mg  every am and 40-80mg  in afternoon PRN pedal edema 120 tablet 11   No facility-administered medications prior to visit.      EXAM:  BP (!) 158/78   Pulse (!) 59   Temp 97.8 F (36.6 C)   Wt 213 lb (96.6 kg)   SpO2 96%   BMI 28.10 kg/m   Body mass index is 28.1 kg/m.  GENERAL: vitals reviewed and listed above, alert, oriented, appears well hydrated and in no acute distress verbal alert  Looks puffy  HEENT: atraumatic, conjunctiva  clear, no obvious abnormalities on inspection of external nose and ears  NECK: no obvious masses on inspection palpation  LUNGS:   bs ?  No wheezes  ? If a few crackles lef t base    CV: HRRR2-3 /6 sem usb   , no clubbing cyanosis tight bilateral edema 2+  But up leg ? chornic  Feet  peripheral edema nl cap refill  MS: moves all extremities  Feet are puffy an pink  But no warmth or cellulitis  And has cracking interdigital noted   No bony pain  PSYCH: pleasant and cooperative, Lab Results  Component Value Date   WBC 6.4 01/28/2017   HGB 13.9 01/28/2017   HCT 41.8 01/28/2017   PLT 180.0 01/28/2017   GLUCOSE 110 (H) 07/01/2017   CHOL 131 06/08/2016   TRIG 95.0 06/08/2016   HDL 39.50 06/08/2016   LDLDIRECT 77 09/10/2011   LDLCALC 73 06/08/2016   ALT 21 09/23/2015   AST 21 09/23/2015   NA 137 07/01/2017   K 4.2 07/01/2017   CL 97 07/01/2017    CREATININE 0.99 07/01/2017   BUN 23 07/01/2017   CO2 34 (H) 07/01/2017   TSH 3.51 10/28/2016   INR 0.95 01/27/2016   HGBA1C 8.1 (H) 07/01/2017   MICROALBUR 5.6 (H) 10/08/2014   BP Readings from Last 3 Encounters:  08/24/17 (!) 158/78  07/14/17 128/68  07/01/17 136/80   Wt Readings from Last 3 Encounters:  08/24/17 213 lb (96.6 kg)  07/14/17 205 lb (93 kg)  07/01/17 195 lb 8 oz (88.7 kg)   Record reviewed    ASSESSMENT AND PLAN:  Discussed the following assessment and plan:  Edema, unspecified type  Need for influenza vaccination - Plan: Flu vaccine HIGH DOSE PF (Fluzone High dose)  Nonrheumatic aortic valve stenosis  Uncontrolled type 2 diabetes mellitus with complication, with long-term current use of insulin (HCC)  Atherosclerosis of native coronary artery of native heart without angina pectoris  Weight gain recent gain in weight with looks acute on like chronic edema changes and worse    Last echo  lvh with nl ef?  At risk for infection  Of feet but dont think so today .   Had  Renal function nl in  past 2 mos.   Inc lasix to 80 bid  And close fu advised    Make appt with cards team  Consideration of torsemide   Opine to pcp and cards as to next step  To get daily weights and fu appt   -Patient advised to return or notify health care team  if  new concerns arise.  Patient Instructions   We can adjust the   Amount of   Diuretic you are on to try to get the swelling down however this is   Just a symptom reliever and doesn't tell u why you are swelling so much  Sometime  time it is a heart problem   Sometimes other problem .    I suggest make appt with cardiology   ( or  Dr Darnell Level in    A week or as needed Please do daily weights     please make appt  With cardiology team in 7-10 days    Wt Readings from Last 3 Encounters:  08/24/17 213 lb (96.6 kg)  07/14/17 205 lb (93 kg)  07/01/17 195 lb 8 oz (88.7 kg)   Seek emergent care if short of breath   Fainting   Rapid  heart rate .          Standley Brooking. Jeree Delcid M.D.

## 2017-08-24 NOTE — Patient Instructions (Addendum)
We can adjust the   Amount of   Diuretic you are on to try to get the swelling down however this is   Just a symptom reliever and doesn't tell u why you are swelling so much  Sometime  time it is a heart problem   Sometimes other problem .    I suggest make appt with cardiology   ( or  Dr Darnell Level in    A week or as needed Please do daily weights     please make appt  With cardiology team in 7-10 days    Wt Readings from Last 3 Encounters:  08/24/17 213 lb (96.6 kg)  07/14/17 205 lb (93 kg)  07/01/17 195 lb 8 oz (88.7 kg)   Seek emergent care if short of breath   Fainting   Rapid heart rate .

## 2017-08-31 ENCOUNTER — Telehealth: Payer: Self-pay | Admitting: Cardiovascular Disease

## 2017-08-31 NOTE — Telephone Encounter (Signed)
Called patient to schedule appt with PA per Dr. [name].  The patient gave me his sons number to get in touch with him as he would be transporting the patient to the office.

## 2017-08-31 NOTE — Progress Notes (Signed)
Message sent to scheduling to arrange this appt.

## 2017-08-31 NOTE — Telephone Encounter (Signed)
Called patients son Strummer Canipe to make an appointment for the patient to see a PA this week.  Left our number to call back to schedule the appointment.

## 2017-09-01 ENCOUNTER — Telehealth: Payer: Self-pay | Admitting: Cardiovascular Disease

## 2017-09-01 NOTE — Telephone Encounter (Signed)
Called son [name] and LVM to call back to schedule a visit with a PA this week.  ST

## 2017-09-02 ENCOUNTER — Ambulatory Visit: Payer: Medicare Other | Admitting: Physician Assistant

## 2017-09-02 VITALS — BP 158/68 | HR 78 | Ht 73.0 in | Wt 214.0 lb

## 2017-09-02 DIAGNOSIS — Z7901 Long term (current) use of anticoagulants: Secondary | ICD-10-CM | POA: Diagnosis not present

## 2017-09-02 DIAGNOSIS — I5033 Acute on chronic diastolic (congestive) heart failure: Secondary | ICD-10-CM | POA: Diagnosis not present

## 2017-09-02 DIAGNOSIS — I5022 Chronic systolic (congestive) heart failure: Secondary | ICD-10-CM | POA: Diagnosis not present

## 2017-09-02 DIAGNOSIS — I483 Typical atrial flutter: Secondary | ICD-10-CM

## 2017-09-02 DIAGNOSIS — I350 Nonrheumatic aortic (valve) stenosis: Secondary | ICD-10-CM | POA: Diagnosis not present

## 2017-09-02 LAB — BASIC METABOLIC PANEL
BUN / CREAT RATIO: 19 (ref 10–24)
BUN: 20 mg/dL (ref 8–27)
CALCIUM: 9.8 mg/dL (ref 8.6–10.2)
CHLORIDE: 101 mmol/L (ref 96–106)
CO2: 28 mmol/L (ref 20–29)
CREATININE: 1.05 mg/dL (ref 0.76–1.27)
GFR calc Af Amer: 76 mL/min/{1.73_m2} (ref >59)
GFR calc non Af Amer: 65 mL/min/{1.73_m2} (ref >59)
GLUCOSE: 111 mg/dL — AB (ref 65–99)
Potassium: 4.4 mmol/L (ref 3.5–5.2)
Sodium: 146 mmol/L — ABNORMAL HIGH (ref 134–144)

## 2017-09-02 MED ORDER — TORSEMIDE 20 MG PO TABS: 40.0000 mg | ORAL_TABLET | Freq: Two times a day (BID) | ORAL | 3 refills | Status: DC

## 2017-09-02 NOTE — Progress Notes (Signed)
Cardiology Office Note   Date:  09/02/2017   ID:  Charles [name], DOB 1931/11/02, MRN [id number]  PCP:  [name], MD  Cardiologist: Dr. [name], 01/19/2017 Rosaria Ferries, PA-C    History of Present Illness: Charles [name] is a 83 y.o. male with a history of CABG January 02, 2003 w/ LIMA-LAD, SVG-RCA, SVG-OM, SVG-RI, RAS w/ bilat 70% dz, s/p L-CEA, HTN, HLD, NIDDM, MV January 01, 2013 no ischemia, A flutter dx 2015/01/02 s/p TEE/DCCV 11/02/2016, on Eliquis w/ CHADS2VASC=5 (age x 2, HTN, CAD, DM), mod-severe AS by echo 01/02/15 (peak gradient 49)  01/19/2017 office visit, patient doing well, Dr. [name] wanted an echo in 6 months, pt in [place] 08/24/2017 PCP visit with weight gain and lower extremity edema, Lasix increased to 80 mg twice daily  Charles [name] presents for cardiology evaluation and follow up.   He feels his breathing got worse about 2 weeks ago.   He has been compliant with the increased Lasix dose. He does not feel his breathing is any better, but the LE edema is significantly improved.  He has not been weighing himself.  He does not have scales.  States he can get them.  He cannot breathe lying on his L side (had slept on L side for decades). He can breathe on his R side. Denies PND. Significantly increased DOE.  Ambulation is poor because of the shortness of breath.  Gets weak.  He has not had palpitations does not think he has had any more atrial fib.  He wakes frequently in the night due to stress from the death of his wife in 01/02/2015 and the coming holidays.  He had an accident in the bathroom and was incontinent of bowel today.  This is unusual for him.  He has not been having diarrhea.   Past Medical History:  Diagnosis Date  . Abdominal aortic atherosclerosis ([place])    by xray  . Aortic stenosis 06/2014   moderate-severe  . Arthritis    in lower back (06/24/2015)  . Atrial flutter ([place])    notes 06/24/2015  . 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 CAD  (coronary artery disease) 01-02-2003   a. s/p CABG in 02-Jan-2003 with LIMA-LAD, SVG-RCA, SVG-OM, and SVG-RI  . Carotid stenosis 1999   s/p L CEA  . CHF (congestive heart failure) ([place]) dx'd 06/2015   a. EF 55-60% by echo in Jan 02, 2015. b. 10/2016: EF reduced to 30-35% by TEE in the setting of atrial fibrillation with RVR  . Colon polyp 02-Jan-2004   (Dr. Tiffany Kocher)  . Compression fracture of L1 lumbar vertebra (HCC) remote  . Compression fracture of L1 lumbar vertebra (HCC) 02/2016   chronic by xray  . COPD (chronic obstructive pulmonary disease) ([place]) 03/2011   by xray  . DDD (degenerative disc disease), lumbar 01/01/13   severe L2/3 L5/S1 by xray  . Depression    hx  . Ex-smoker   . GERD (gastroesophageal reflux disease) 01-01-2002   h/o duodenal ulcer per EGD as well as esophagitis  . History of chicken pox   . History of colon polyps 2003, 2005   adenomatous Vira Agar)  . HLD (hyperlipidemia)   . HTN (hypertension)   . Psoriasis   . Renal artery stenosis ([place]) Jan 02, 2003   70% bilateral, followed by cards  . Seasonal allergies   . T2DM (type 2 diabetes mellitus) ([place]) 1995  . Urge incontinence of urine     Past Surgical History:  Procedure Laterality Date  . CARDIAC CATHETERIZATION  03/06/2003   No intervention - recommend CABG  . CARDIOVASCULAR STRESS TEST  11/2010   normal perfusion, no evidence of ischemia, EF 62% post exercise  . CARDIOVASCULAR STRESS TEST  11/28/2012   Mild diaphragmatic attenuation; cannot exclude a focal region of nontransmural inferior scar  . CARDIOVERSION N/A 06/25/2015   Procedure: CARDIOVERSION;  Surgeon: Pixie Casino, MD;  Location: [facility] ENDOSCOPY;  Service: Cardiovascular;  Laterality: N/A;  . CARDIOVERSION N/A 11/02/2016   Procedure: CARDIOVERSION;  Surgeon: [name], MD;  Location: [place];  Service: Cardiovascular;  Laterality: N/A;  . CAROTID ENDARTERECTOMY Left 1999   ([place])  . CATARACT EXTRACTION W/ INTRAOCULAR LENS  IMPLANT, BILATERAL Bilateral 01/2013   [place]  .  COLONOSCOPY  2003   colon polyp x3 - adenomatous Tiffany Kocher)  . COLONOSCOPY  10/08/2012   2 TA, diverticulosis, int hem, no rpt rec Tiffany Kocher)  . CORONARY ANGIOPLASTY    . CORONARY ARTERY BYPASS GRAFT  03/07/2003   4v CABG ([place]) with LIMA to LAD, vein graft to RCA, 1st obtuse marginal, and ramus intermedius  . ESOPHAGOGASTRODUODENOSCOPY  10/08/2012   nl esophagus, duodenitis and erosive gastropathy, path - gastropathy no Hpylori, no rpt rec  . LUMBAR EPIDURAL INJECTION  03/2017   L5/S1 ([place])  . PERIPHERAL VASCULAR CATHETERIZATION N/A 02/02/2016   Procedure: Renal Angiography;  Surgeon: [name], MD;  Location: [place] CV LAB;  Service: Cardiovascular;  Laterality: N/A;  . PERIPHERAL VASCULAR CATHETERIZATION N/A 02/02/2016   Procedure: Abdominal Aortogram;  Surgeon: [name], MD;  Location: [place] CV LAB;  Service: Cardiovascular;  Laterality: N/A;  . RENAL DOPPLER  11/29/2011   Celiac&SMA-demonstrated vessel narrowing suggestive of a greater than 50% diameter reduction. Bilateral renal arteries-demonstrated vessel narrowing of 60-99% diameter reduction. Rt Kidney-mid pole lateral simple cyst noted measuring 1.29x0.76x1.11cm and exophytic cyst outside lower pole measuring 1.23x0.96x1.31. Lft Kidney-lateral mid to lower pole simple cyst measuering-1.24x9.83x1.24  . TEE WITHOUT CARDIOVERSION N/A 06/25/2015   Procedure: TRANSESOPHAGEAL ECHOCARDIOGRAM (TEE);  Surgeon: Pixie Casino, MD;  [facility] END[facility]COPY;  Service: Cardiovascular;  Laterality: N/A;  . TEE WITHOUT CARDIOVERSION N/A 11/02/2016   Procedure: TRANSESOPHAGEAL ECHOCARDIOGRAM (TEE);  Surgeon: [name], MD;  Location: [facility] ENDOSCOPY;  Service: Cardiovascular;  Laterality: N/A;  . TONSILLECTOMY    . UPPER GASTROINTESTINAL ENDOSCOPY  2003   reflux esophagitis, erosive gastropathy, duodenal ulcer    Current Outpatient Medications  Medication Sig Dispense Refill  . ACCU-CHEK AVIVA PLUS test strip CHECK BLOOD  SUGAR THREE TIMES A DAY AND AS DIRECTED. 100 each 6  . acetaminophen (TYLENOL) 500 MG tablet Take 1 tablet (500 mg total) by mouth 3 (three) times daily.    [name] apixaban (ELIQUIS) 2.5 MG TABS tablet Take 1 tablet (2.5 mg total) by mouth 2 (two) times daily. 60 tablet 6  . atorvastatin (LIPITOR) 20 MG tablet TAKE 1 TABLET DAILY. 90 tablet 0  . BAYER MICROLET LANCETS lancets Ck blood sugar three times daily and as directed. Dx 250.00. Insulin dependent 100 each 11  . citalopram (CELEXA) 20 MG tablet Take 1 tablet (20 mg total) by mouth daily. 90 tablet 1  . Cyanocobalamin (B-12) 500 MCG TABS Take 1 tablet by mouth daily.    [name] ELIQUIS 5 MG TABS tablet Take 1 tablet by mouth 2 (two) times daily.    [name] ELIQUIS 5 MG TABS tablet Take 1 tablet (5 mg total) by mouth 2 (two) times daily. 60 tablet  2  . furosemide (LASIX) 40 MG tablet Take 80 mg twice a day   Or as directed 120 tablet 1  . HYDROcodone-acetaminophen (NORCO/VICODIN) 5-325 MG tablet Take 0.5-1 tablets by mouth 2 (two) times daily as needed for moderate pain. 60 tablet 0  . Insulin Syringe-Needle U-100 (B-D INS SYR ULTRAFINE 1CC/30G) 30G X 1/2" 1 ML MISC 1 Syringe by Does not apply route as directed. 100 each 3  . Insulin Syringe-Needle U-100 (B-D INS SYRINGE 0.5CC/30GX1/2") 30G X 1/2" 0.5 ML MISC Use as directed to inject insulin. Dx: E11.65 100 each 3  . LANTUS 100 UNIT/ML injection Inject 0.35 mLs (35 Units total) into the skin at bedtime. 10 mL 2  . lisinopril (PRINIVIL,ZESTRIL) 40 MG tablet Take 1 tablet (40 mg total) by mouth daily. 90 tablet 1  . metFORMIN (GLUCOPHAGE) 1000 MG tablet Take 1 tablet (1,000 mg total) by mouth 2 (two) times daily with a meal. 180 tablet 1  . metoprolol tartrate (LOPRESSOR) 50 MG tablet Take one-half (25mg ) tablet twice a day 60 tablet 3  . pantoprazole (PROTONIX) 20 MG tablet Take 1 tablet (20 mg total) by mouth daily. 30 tablet 6  . potassium chloride SA (K-DUR,KLOR-CON) 20 MEQ tablet Take 1 tablet (20 mEq  total) by mouth daily. Hold potassium on days you need spironolactone    . saw palmetto 160 MG capsule Take 450 mg by mouth daily.     . tamsulosin (FLOMAX) 0.4 MG CAPS capsule Take 1 capsule (0.4 mg total) by mouth daily after supper. 30 capsule 6  . traZODone (DESYREL) 50 MG tablet Take 2 tablets (100 mg total) by mouth at bedtime. 180 tablet 0  . vitamin C (ASCORBIC ACID) 500 MG tablet Take 500 mg by mouth daily.     No current facility-administered medications for this visit.     Allergies:   Gabapentin; Spironolactone; Amlodipine; Other; Rosiglitazone maleate; and Tricor [fenofibrate]    Social History:  The patient  reports that he quit smoking about 4 years ago. His smoking use included cigarettes. He has a 18.00 pack-year smoking history. he has never used smokeless tobacco. He reports that he does not drink alcohol or use drugs.   Family History:  The patient's family history includes Cancer in his brother, brother, maternal grandmother, and mother; Diabetes in his sister; Heart attack in his maternal grandfather, paternal grandfather, and paternal grandmother; Heart disease in his father and sister; Hypertension in his father and sister; Lung disease in his brother; Stroke in his sister.    ROS:  Please see the history of present illness. All other systems are reviewed and negative.    PHYSICAL EXAM: VS:  BP (!) 158/68   Pulse 78   Ht 6\' 1"  (1.854 m)   Wt 214 lb (97.1 kg)   BMI 28.23 kg/m  , BMI Body mass index is 28.23 kg/m. GEN: Well nourished, well developed, male in no acute distress  HEENT: normal for age  Neck: JVD 8-9 cm, no carotid bruit, no masses Cardiac: RRR; 2-3/6 AS murmur, no rubs, or gallops Respiratory: scattered dry rales bilaterally, normal work of breathing GI: soft, nontender, nondistended, + BS, +hepatojugular reflux MS: no deformity or atrophy; no edema; distal pulses are 2+ in all 4 extremities   Skin: warm and dry, no rash Neuro:  Strength and  sensation are intact Psych: euthymic mood, full affect   EKG:  EKG is not ordered today.  ECHO: 08/08/2017 - Left ventricle: The cavity size was normal.  There was severe   concentric hypertrophy. Systolic function was normal. The   estimated ejection fraction was in the range of 55% to 60%. Wall   motion was normal; there were no regional wall motion   abnormalities. Features are consistent with a pseudonormal left   ventricular filling pattern, with concomitant abnormal relaxation   and increased filling pressure (grade 2 diastolic dysfunction).   Doppler parameters are consistent with high ventricular filling pressure. - Aortic valve: Right coronary cusp immobility was noted. There was   moderate to severe stenosis. There was trivial regurgitation.   Mean gradient (S): 32 mm Hg. - Aorta: Ascending aortic diameter: 41 mm (S). - Ascending aorta: The ascending aorta was mildly dilated. - Mitral valve: Severely calcified annulus. Diffuse calcification   of the anterior leaflet. There was mild regurgitation. - Left atrium: The atrium was mildly dilated. - Tricuspid valve: There was trivial regurgitation. - Pulmonary arteries: PA peak pressure: 39 mm Hg (S). Impressions: - The right ventricular systolic pressure was increased consistent   with mild pulmonary hypertension.   Recent Labs: 10/28/2016: TSH 3.51 01/28/2017: Hemoglobin 13.9; Platelets 180.0; Pro B Natriuretic peptide (BNP) 537.0 07/01/2017: BUN 23; Creatinine, Ser 0.99; Potassium 4.2; Sodium 137    Lipid Panel    Component Value Date/Time   CHOL 131 06/08/2016 1423   TRIG 95.0 06/08/2016 1423   TRIG 133 09/10/2011   HDL 39.50 06/08/2016 1423   CHOLHDL 3 06/08/2016 1423   VLDL 19.0 06/08/2016 1423   LDLCALC 73 06/08/2016 1423   LDLDIRECT 77 09/10/2011     Wt Readings from Last 3 Encounters:  09/02/17 214 lb (97.1 kg)  08/24/17 213 lb (96.6 kg)  07/14/17 205 lb (93 kg)     Other studies Reviewed: Additional  studies/ records that were reviewed today include: office notes, hospital records and testing.  ASSESSMENT AND PLAN:  1.  Acute on chronic diastolic CHF: His weight is up 9 pounds.  PCP increased his Lasix to 80 mg twice daily, the lower extremity edema improved, but he did not lose any weight per our scales.  I will change the Lasix to Demadex 40 mg twice daily.  This may work a little better for him.  I will see him next Tuesday to assess volume status and recheck his labs.  BMET  Today.  The patient and his son were given information on sodium and fluid restrictions  2.  Persistent A. fib: He is doing well after his cardioversion.  His heart rate is regular, I do not believe he is in atrial fib today.  3.  Chronic anticoagulation: Is not aware of any bleeding issues on the Eliquis.  Check a CBC next week   Current medicines are reviewed at length with the patient today.  The patient does not have concerns regarding medicines.  The following changes have been made: Change Lasix to Demadex  Labs/ tests ordered today include:   Orders Placed This Encounter  Procedures  . Basic Metabolic Panel (BMET)     Disposition:   FU with Dr. [name]  Signed, Rosaria Ferries, PA-C  09/02/2017 9:28 AM    [place] Phone: [phone]; Fax: [phone]  This note was written with the assistance of speech recognition software. Please excuse any transcriptional errors.

## 2017-09-02 NOTE — Patient Instructions (Addendum)
Medication Instructions:   STOP TAKING LASIX   START TAKING DEMADEX  40 MG TWICE DAY   If you need a refill on your cardiac medications before your next appointment, please call your pharmacy.  Labwork:  BMET TODAY    Testing/Procedures: NONE ORDERED  TODAY    Follow-Up:  NEX Tuesday WITH RIHONDA BARRETT   Any Other Special Instructions Will Be Listed Below (If Applicable).  MAKE SURE YOU HAVE 2 LITERS  OF FLUID A DAY   . Low-Sodium Eating Plan Sodium, which is an element that makes up salt, helps you maintain a healthy balance of fluids in your body. Too much sodium can increase your blood pressure and cause fluid and waste to be held in your body. Your health care provider or dietitian may recommend following this plan if you have high blood pressure (hypertension), kidney disease, liver disease, or heart failure. Eating less sodium can help lower your blood pressure, reduce swelling, and protect your heart, liver, and kidneys. What are tips for following this plan? General guidelines  Most people on this plan should limit their sodium intake to 1,500-2,000 mg (milligrams) of sodium each day. Reading food labels  The Nutrition Facts label lists the amount of sodium in one serving of the food. If you eat more than one serving, you must multiply the listed amount of sodium by the number of servings.  Choose foods with less than 140 mg of sodium per serving.  Avoid foods with 300 mg of sodium or more per serving. Shopping  Look for lower-sodium products, often labeled as "low-sodium" or "no salt added."  Always check the sodium content even if foods are labeled as "unsalted" or "no salt added".  Buy fresh foods. ? Avoid canned foods and premade or frozen meals. ? Avoid canned, cured, or processed meats  Buy breads that have less than 80 mg of sodium per slice. Cooking  Eat more home-cooked food and less restaurant, buffet, and fast food.  Avoid adding salt when  cooking. Use salt-free seasonings or herbs instead of table salt or sea salt. Check with your health care provider or pharmacist before using salt substitutes.  Cook with plant-based oils, such as canola, sunflower, or olive oil. Meal planning  When eating at a restaurant, ask that your food be prepared with less salt or no salt, if possible.  Avoid foods that contain MSG (monosodium glutamate). MSG is sometimes added to [place] food, bouillon, and some canned foods. What foods are recommended? The items listed may not be a complete list. Talk with your dietitian about what dietary choices are best for you. Grains Low-sodium cereals, including oats, puffed wheat and rice, and shredded wheat. Low-sodium crackers. Unsalted rice. Unsalted pasta. Low-sodium bread. Whole-grain breads and whole-grain pasta. Vegetables Fresh or frozen vegetables. "No salt added" canned vegetables. "No salt added" tomato sauce and paste. Low-sodium or reduced-sodium tomato and vegetable juice. Fruits Fresh, frozen, or canned fruit. Fruit juice. Meats and other protein foods Fresh or frozen (no salt added) meat, poultry, seafood, and fish. Low-sodium canned tuna and salmon. Unsalted nuts. Dried peas, beans, and lentils without added salt. Unsalted canned beans. Eggs. Unsalted nut butters. Dairy Milk. Soy milk. Cheese that is naturally low in sodium, such as ricotta cheese, fresh mozzarella, or Swiss cheese Low-sodium or reduced-sodium cheese. Cream cheese. Yogurt. Fats and oils Unsalted butter. Unsalted margarine with no trans fat. Vegetable oils such as canola or olive oils. Seasonings and other foods Fresh and dried herbs and spices.  Salt-free seasonings. Low-sodium mustard and ketchup. Sodium-free salad dressing. Sodium-free light mayonnaise. Fresh or refrigerated horseradish. Lemon juice. Vinegar. Homemade, reduced-sodium, or low-sodium soups. Unsalted popcorn and pretzels. Low-salt or salt-free chips. What foods  are not recommended? The items listed may not be a complete list. Talk with your dietitian about what dietary choices are best for you. Grains Instant hot cereals. Bread stuffing, pancake, and biscuit mixes. Croutons. Seasoned rice or pasta mixes. Noodle soup cups. Boxed or frozen macaroni and cheese. Regular salted crackers. Self-rising flour. Vegetables Sauerkraut, pickled vegetables, and relishes. Olives. [place] fries. Onion rings. Regular canned vegetables (not low-sodium or reduced-sodium). Regular canned tomato sauce and paste (not low-sodium or reduced-sodium). Regular tomato and vegetable juice (not low-sodium or reduced-sodium). Frozen vegetables in sauces. Meats and other protein foods Meat or fish that is salted, canned, smoked, spiced, or pickled. Bacon, ham, sausage, hotdogs, corned beef, chipped beef, packaged lunch meats, salt pork, jerky, pickled herring, anchovies, regular canned tuna, sardines, salted nuts. Dairy Processed cheese and cheese spreads. Cheese curds. Blue cheese. Feta cheese. String cheese. Regular cottage cheese. Buttermilk. Canned milk. Fats and oils Salted butter. Regular margarine. Ghee. Bacon fat. Seasonings and other foods Onion salt, garlic salt, seasoned salt, table salt, and sea salt. Canned and packaged gravies. Worcestershire sauce. Tartar sauce. Barbecue sauce. Teriyaki sauce. Soy sauce, including reduced-sodium. Steak sauce. Fish sauce. Oyster sauce. Cocktail sauce. Horseradish that you find on the shelf. Regular ketchup and mustard. Meat flavorings and tenderizers. Bouillon cubes. Hot sauce and Tabasco sauce. Premade or packaged marinades. Premade or packaged taco seasonings. Relishes. Regular salad dressings. Salsa. Potato and tortilla chips. Corn chips and puffs. Salted popcorn and pretzels. Canned or dried soups. Pizza. Frozen entrees and pot pies. Summary  Eating less sodium can help lower your blood pressure, reduce swelling, and protect your heart,  liver, and kidneys.  Most people on this plan should limit their sodium intake to 1,500-2,000 mg (milligrams) of sodium each day.  Canned, boxed, and frozen foods are high in sodium. Restaurant foods, fast foods, and pizza are also very high in sodium. You also get sodium by adding salt to food.  Try to cook at home, eat more fresh fruits and vegetables, and eat less fast food, canned, processed, or prepared foods. This information is not intended to replace advice given to you by your health care provider. Make sure you discuss any questions you have with your health care provider. Document Released: 03/12/2002 Document Revised: 09/13/2016 Document Reviewed: 09/13/2016 Elsevier Interactive Patient Education  2017 [employer].

## 2017-09-05 ENCOUNTER — Ambulatory Visit: Payer: Medicare Other | Admitting: Family Medicine

## 2017-09-06 ENCOUNTER — Inpatient Hospital Stay (HOSPITAL_COMMUNITY)
Admission: EM | Admit: 2017-09-06 | Discharge: 2017-09-09 | DRG: 293 | Disposition: A | Discharge status: Home Health | Payer: Medicare Other | Attending: Cardiovascular Disease | Admitting: Cardiovascular Disease

## 2017-09-06 ENCOUNTER — Ambulatory Visit: Payer: Medicare Other | Admitting: Physician Assistant

## 2017-09-06 ENCOUNTER — Encounter (HOSPITAL_COMMUNITY): Payer: Self-pay | Admitting: General Practice

## 2017-09-06 ENCOUNTER — Encounter: Payer: Self-pay | Admitting: Physician Assistant

## 2017-09-06 ENCOUNTER — Emergency Department (HOSPITAL_COMMUNITY): Payer: Medicare Other

## 2017-09-06 ENCOUNTER — Encounter (HOSPITAL_COMMUNITY): Payer: Self-pay | Admitting: *Deleted

## 2017-09-06 ENCOUNTER — Other Ambulatory Visit: Payer: Self-pay

## 2017-09-06 VITALS — BP 177/84 | HR 44 | Ht 73.0 in | Wt 218.2 lb

## 2017-09-06 DIAGNOSIS — R001 Bradycardia, unspecified: Secondary | ICD-10-CM | POA: Diagnosis not present

## 2017-09-06 DIAGNOSIS — Z7901 Long term (current) use of anticoagulants: Secondary | ICD-10-CM

## 2017-09-06 DIAGNOSIS — I5022 Chronic systolic (congestive) heart failure: Secondary | ICD-10-CM | POA: Diagnosis not present

## 2017-09-06 DIAGNOSIS — I441 Atrioventricular block, second degree: Secondary | ICD-10-CM | POA: Diagnosis not present

## 2017-09-06 DIAGNOSIS — E785 Hyperlipidemia, unspecified: Secondary | ICD-10-CM | POA: Diagnosis present

## 2017-09-06 DIAGNOSIS — I5032 Chronic diastolic (congestive) heart failure: Secondary | ICD-10-CM | POA: Diagnosis present

## 2017-09-06 DIAGNOSIS — E1165 Type 2 diabetes mellitus with hyperglycemia: Secondary | ICD-10-CM | POA: Diagnosis not present

## 2017-09-06 DIAGNOSIS — IMO0002 Reserved for concepts with insufficient information to code with codable children: Secondary | ICD-10-CM | POA: Diagnosis present

## 2017-09-06 DIAGNOSIS — I5033 Acute on chronic diastolic (congestive) heart failure: Secondary | ICD-10-CM

## 2017-09-06 DIAGNOSIS — I2510 Atherosclerotic heart disease of native coronary artery without angina pectoris: Secondary | ICD-10-CM | POA: Diagnosis not present

## 2017-09-06 DIAGNOSIS — I350 Nonrheumatic aortic (valve) stenosis: Secondary | ICD-10-CM

## 2017-09-06 DIAGNOSIS — Z79899 Other long term (current) drug therapy: Secondary | ICD-10-CM

## 2017-09-06 DIAGNOSIS — E876 Hypokalemia: Secondary | ICD-10-CM | POA: Diagnosis not present

## 2017-09-06 DIAGNOSIS — Z794 Long term (current) use of insulin: Secondary | ICD-10-CM

## 2017-09-06 DIAGNOSIS — R05 Cough: Secondary | ICD-10-CM | POA: Diagnosis not present

## 2017-09-06 DIAGNOSIS — E118 Type 2 diabetes mellitus with unspecified complications: Secondary | ICD-10-CM

## 2017-09-06 DIAGNOSIS — Z951 Presence of aortocoronary bypass graft: Secondary | ICD-10-CM | POA: Diagnosis not present

## 2017-09-06 DIAGNOSIS — R0602 Shortness of breath: Secondary | ICD-10-CM | POA: Diagnosis not present

## 2017-09-06 DIAGNOSIS — Z87891 Personal history of nicotine dependence: Secondary | ICD-10-CM | POA: Diagnosis not present

## 2017-09-06 DIAGNOSIS — I272 Pulmonary hypertension, unspecified: Secondary | ICD-10-CM | POA: Diagnosis not present

## 2017-09-06 DIAGNOSIS — I110 Hypertensive heart disease with heart failure: Secondary | ICD-10-CM | POA: Diagnosis not present

## 2017-09-06 HISTORY — DX: Atrioventricular block, second degree: I44.1

## 2017-09-06 LAB — CBC
HEMATOCRIT: 39.4 % (ref 39.0–52.0)
HEMOGLOBIN: 12.8 g/dL — AB (ref 13.0–17.0)
MCH: 30.3 pg (ref 26.0–34.0)
MCHC: 32.5 g/dL (ref 30.0–36.0)
MCV: 93.1 fL (ref 78.0–100.0)
Platelets: 172 10*3/uL (ref 150–400)
RBC: 4.23 MIL/uL (ref 4.22–5.81)
RDW: 13.3 % (ref 11.5–15.5)
WBC: 6.9 10*3/uL (ref 4.0–10.5)

## 2017-09-06 LAB — BASIC METABOLIC PANEL
ANION GAP: 11 (ref 5–15)
BUN: 20 mg/dL (ref 6–20)
CO2: 31 mmol/L (ref 22–32)
Calcium: 9.7 mg/dL (ref 8.9–10.3)
Chloride: 98 mmol/L — ABNORMAL LOW (ref 101–111)
Creatinine, Ser: 1.07 mg/dL (ref 0.61–1.24)
GLUCOSE: 248 mg/dL — AB (ref 65–99)
POTASSIUM: 3.9 mmol/L (ref 3.5–5.1)
SODIUM: 140 mmol/L (ref 135–145)

## 2017-09-06 LAB — GLUCOSE, CAPILLARY: Glucose-Capillary: 246 mg/dL — ABNORMAL HIGH (ref 65–99)

## 2017-09-06 LAB — I-STAT TROPONIN, ED: TROPONIN I, POC: 0.06 ng/mL (ref 0.00–0.08)

## 2017-09-06 LAB — BRAIN NATRIURETIC PEPTIDE: B NATRIURETIC PEPTIDE 5: 587.9 pg/mL — AB (ref 0.0–100.0)

## 2017-09-06 LAB — TSH: TSH: 1.832 u[IU]/mL (ref 0.350–4.500)

## 2017-09-06 MED ORDER — INSULIN GLARGINE 100 UNIT/ML ~~LOC~~ SOLN
35.0000 [IU] | Freq: Every day | SUBCUTANEOUS | Status: DC
  Administered 2017-09-06 – 2017-09-08 (×3): 35 [IU] via SUBCUTANEOUS
  Filled 2017-09-06 (×6): qty 0.35

## 2017-09-06 MED ORDER — FLUTICASONE PROPIONATE 50 MCG/ACT NA SUSP: 2.0000 | NASAL | Status: DC | PRN

## 2017-09-06 MED ORDER — PANTOPRAZOLE SODIUM 20 MG PO TBEC
20.0000 mg | DELAYED_RELEASE_TABLET | Freq: Every day | ORAL | Status: DC
  Administered 2017-09-07 – 2017-09-09 (×3): 20 mg via ORAL
  Filled 2017-09-06 (×3): qty 1

## 2017-09-06 MED ORDER — VITAMIN B-12 1000 MCG PO TABS
500.0000 ug | ORAL_TABLET | Freq: Every day | ORAL | Status: DC
  Administered 2017-09-07 – 2017-09-09 (×3): 500 ug via ORAL
  Filled 2017-09-06 (×3): qty 1

## 2017-09-06 MED ORDER — TAMSULOSIN HCL 0.4 MG PO CAPS
0.4000 mg | ORAL_CAPSULE | Freq: Every day | ORAL | Status: DC
  Administered 2017-09-06 – 2017-09-08 (×3): 0.4 mg via ORAL
  Filled 2017-09-06 (×4): qty 1

## 2017-09-06 MED ORDER — TRAZODONE HCL 100 MG PO TABS
100.0000 mg | ORAL_TABLET | Freq: Every day | ORAL | Status: DC
  Administered 2017-09-06 – 2017-09-08 (×3): 100 mg via ORAL
  Filled 2017-09-06 (×3): qty 1

## 2017-09-06 MED ORDER — ZOLPIDEM TARTRATE 5 MG PO TABS: 5.0000 mg | ORAL_TABLET | Freq: Every evening | ORAL | Status: DC | PRN

## 2017-09-06 MED ORDER — B-12 500 MCG PO TABS: 1.0000 | ORAL_TABLET | Freq: Every day | ORAL | Status: DC

## 2017-09-06 MED ORDER — LISINOPRIL 40 MG PO TABS
40.0000 mg | ORAL_TABLET | Freq: Every day | ORAL | Status: DC
  Administered 2017-09-07 – 2017-09-09 (×3): 40 mg via ORAL
  Filled 2017-09-06 (×3): qty 1

## 2017-09-06 MED ORDER — NITROGLYCERIN 0.4 MG SL SUBL: 0.4000 mg | SUBLINGUAL_TABLET | SUBLINGUAL | Status: DC | PRN

## 2017-09-06 MED ORDER — ATORVASTATIN CALCIUM 20 MG PO TABS
20.0000 mg | ORAL_TABLET | Freq: Every day | ORAL | Status: DC
  Administered 2017-09-06 – 2017-09-09 (×4): 20 mg via ORAL
  Filled 2017-09-06 (×4): qty 1

## 2017-09-06 MED ORDER — ONDANSETRON HCL 4 MG/2ML IJ SOLN: 4.0000 mg | Freq: Four times a day (QID) | INTRAMUSCULAR | Status: DC | PRN

## 2017-09-06 MED ORDER — APIXABAN 5 MG PO TABS
5.0000 mg | ORAL_TABLET | Freq: Two times a day (BID) | ORAL | Status: DC
  Administered 2017-09-06 – 2017-09-09 (×6): 5 mg via ORAL
  Filled 2017-09-06 (×7): qty 1

## 2017-09-06 MED ORDER — ALPRAZOLAM 0.25 MG PO TABS: 0.2500 mg | ORAL_TABLET | Freq: Two times a day (BID) | ORAL | Status: DC | PRN

## 2017-09-06 MED ORDER — ACETAMINOPHEN 325 MG PO TABS
650.0000 mg | ORAL_TABLET | ORAL | Status: DC | PRN
Start: 2017-09-06 — End: 2017-09-09
  Administered 2017-09-07: 650 mg via ORAL

## 2017-09-06 MED ORDER — SODIUM CHLORIDE 0.9% FLUSH: 3.0000 mL | INTRAVENOUS | Status: DC | PRN

## 2017-09-06 MED ORDER — FUROSEMIDE 10 MG/ML IJ SOLN
80.0000 mg | Freq: Two times a day (BID) | INTRAMUSCULAR | Status: DC
  Administered 2017-09-06 – 2017-09-09 (×6): 80 mg via INTRAVENOUS
  Filled 2017-09-06 (×6): qty 8

## 2017-09-06 MED ORDER — SODIUM CHLORIDE 0.9 % IV SOLN: 250.0000 mL | INTRAVENOUS | Status: DC | PRN

## 2017-09-06 MED ORDER — SODIUM CHLORIDE 0.9% FLUSH
3.0000 mL | Freq: Two times a day (BID) | INTRAVENOUS | Status: DC
Start: 2017-09-06 — End: 2017-09-09
  Administered 2017-09-06 – 2017-09-09 (×5): 3 mL via INTRAVENOUS

## 2017-09-06 MED ORDER — CITALOPRAM HYDROBROMIDE 20 MG PO TABS
20.0000 mg | ORAL_TABLET | Freq: Every day | ORAL | Status: DC
  Administered 2017-09-07 – 2017-09-09 (×3): 20 mg via ORAL
  Filled 2017-09-06 (×3): qty 1

## 2017-09-06 MED ORDER — VITAMIN C 500 MG PO TABS
500.0000 mg | ORAL_TABLET | Freq: Every day | ORAL | Status: DC
  Administered 2017-09-07 – 2017-09-09 (×3): 500 mg via ORAL
  Filled 2017-09-06 (×3): qty 1

## 2017-09-06 MED ORDER — POTASSIUM CHLORIDE CRYS ER 20 MEQ PO TBCR
20.0000 meq | EXTENDED_RELEASE_TABLET | Freq: Two times a day (BID) | ORAL | Status: DC
  Administered 2017-09-06 – 2017-09-09 (×6): 20 meq via ORAL
  Filled 2017-09-06 (×6): qty 1

## 2017-09-06 NOTE — Patient Instructions (Signed)
GO DIRECTLY TO [facility] FOR ADMISSION

## 2017-09-06 NOTE — ED Notes (Signed)
Condom cath placed on pt due to pts cc and lasix order.

## 2017-09-06 NOTE — ED Notes (Signed)
Bilateral DP pulse obtained with doppler.

## 2017-09-06 NOTE — ED Triage Notes (Signed)
Pt sent here from cardiology office for admission. Pt reports ongoing sob with mild exertion and has swelling to ankles. Denies cp. Airway intact at triage.

## 2017-09-06 NOTE — Progress Notes (Signed)
Cardiology Office Note   Date:  09/06/2017   ID:  Charles [name], DOB 09-15-1932, MRN [id number]  PCP:  [name], MD  Cardiologist:  Dr [name], 01/19/2017  Rosaria Ferries, PA-C 09/02/2017  Chief Complaint  Patient presents with  . Shortness of Breath    pt states some SOB, feels like he can"t breath when lying down. also states he isn't feeling well today states he feels like he is about to pass out   . Edema    both ankles    History of Present Illness: Charles [name] is a 83 y.o. male with a history of  CABG 2004 w/ LIMA-LAD, SVG-RCA, SVG-OM, SVG-RI, RAS w/ bilat 70% dz, s/p L-CEA, HTN, HLD, NIDDM, MV 2014 no ischemia, A flutter dx 2016 s/p TEE/DCCV 11/02/2016, on Eliquis w/ CHADS2VASC=5 (age x 2, HTN, CAD, DM), mod-severe AS by echo 2016 (peak gradient 49)  11/30 office visit, wt up 9 lbs, Lasix 80 mg bid changed to Demadex 40 mg bid. F/u w/ labs.  Charles [name] presents for cardiology follow up.  He lives alone, has 3 sons and a daughter that help in his care.   He feels terrible. He feels very weak and SOB with minimal activity. No weight loss. No change in LE edema. He gets light-headed if he tries to do anything. Gets extremely SOB going to the bathroom. Cannot walk any distance.   Has not had chest pain or palpitations.  He cannot sleep at night, every night. He has not discussed this with his PCP recently. Not due to SOB, he thinks, it is from grief over his wife's death 2 yr ago.   Sugars are running ok.  He eats a Consulting civil engineer sausage, egg and cheese biscuit every morning (830 mg Na). He does not use salt, but admits it may be in the food he eats.    Past Medical History:  Diagnosis Date  . Abdominal aortic atherosclerosis ([place])    by xray  . Aortic stenosis 06/2014   moderate-severe  . Arthritis    in lower back (06/24/2015)  . Atrial flutter ([place])    notes 06/24/2015  . 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 CAD  (coronary artery disease) 2004   a. s/p CABG in 2004 with LIMA-LAD, SVG-RCA, SVG-OM, and SVG-RI  . Carotid stenosis 1999   s/p L CEA  . CHF (congestive heart failure) ([place]) dx'd 06/2015   a. EF 55-60% by echo in 2016. b. 10/2016: EF reduced to 30-35% by TEE in the setting of atrial fibrillation with RVR  . Colon polyp 2005   (Dr. Tiffany Kocher)  . Compression fracture of L1 lumbar vertebra (HCC) remote  . Compression fracture of L1 lumbar vertebra (HCC) 02/2016   chronic by xray  . COPD (chronic obstructive pulmonary disease) ([place]) 03/2011   by xray  . DDD (degenerative disc disease), lumbar 2014   severe L2/3 L5/S1 by xray  . Depression    hx  . Ex-smoker   . GERD (gastroesophageal reflux disease) 2003   h/o duodenal ulcer per EGD as well as esophagitis  . History of chicken pox   . History of colon polyps 2003, 2005   adenomatous Vira Agar)  . HLD (hyperlipidemia)   . HTN (hypertension)   . Psoriasis   . Renal artery stenosis ([place]) 2004   70% bilateral, followed by cards  . Seasonal allergies   . T2DM (type 2 diabetes mellitus) ([place])  1995  . Urge incontinence of urine     Past Surgical History:  Procedure Laterality Date  . CARDIAC CATHETERIZATION  03/06/2003   No intervention - recommend CABG  . CARDIOVASCULAR STRESS TEST  11/2010   normal perfusion, no evidence of ischemia, EF 62% post exercise  . CARDIOVASCULAR STRESS TEST  11/28/2012   Mild diaphragmatic attenuation; cannot exclude a focal region of nontransmural inferior scar  . CARDIOVERSION N/A 06/25/2015   Procedure: CARDIOVERSION;  Surgeon: Pixie Casino, MD;  Location: [facility] ENDOSCOPY;  Service: Cardiovascular;  Laterality: N/A;  . CARDIOVERSION N/A 11/02/2016   Procedure: CARDIOVERSION;  Surgeon: [name], MD;  Location: [place];  Service: Cardiovascular;  Laterality: N/A;  . CAROTID ENDARTERECTOMY Left 1999   ([place])  . CATARACT EXTRACTION W/ INTRAOCULAR LENS  IMPLANT, BILATERAL Bilateral 01/2013   [place]  .  COLONOSCOPY  2003   colon polyp x3 - adenomatous Tiffany Kocher)  . COLONOSCOPY  10/08/2012   2 TA, diverticulosis, int hem, no rpt rec Tiffany Kocher)  . CORONARY ANGIOPLASTY    . CORONARY ARTERY BYPASS GRAFT  03/07/2003   4v CABG ([place]) with LIMA to LAD, vein graft to RCA, 1st obtuse marginal, and ramus intermedius  . ESOPHAGOGASTRODUODENOSCOPY  10/08/2012   nl esophagus, duodenitis and erosive gastropathy, path - gastropathy no Hpylori, no rpt rec  . LUMBAR EPIDURAL INJECTION  03/2017   L5/S1 ([place])  . PERIPHERAL VASCULAR CATHETERIZATION N/A 02/02/2016   Procedure: Renal Angiography;  Surgeon: [name], MD;  Location: [place] CV LAB;  Service: Cardiovascular;  Laterality: N/A;  . PERIPHERAL VASCULAR CATHETERIZATION N/A 02/02/2016   Procedure: Abdominal Aortogram;  Surgeon: [name], MD;  Location: [place] [facility];  Service: Cardiovascular;  Laterality: N/A;  . RENAL DOPPLER  11/29/2011   Celiac&SMA-demonstrated vessel narrowing suggestive of a greater than 50% diameter reduction. Bilateral renal arteries-demonstrated vessel narrowing of 60-99% diameter reduction. Rt Kidney-mid pole lateral simple cyst noted measuring 1.29x0.76x1.11cm and exophytic cyst outside lower pole measuring 1.23x0.96x1.31. Lft Kidney-lateral mid to lower pole simple cyst measuering-1.24x9.83x1.24  . TEE WITHOUT CARDIOVERSION N/A 06/25/2015   Procedure: TRANSESOPHAGEAL ECHOCARDIOGRAM (TEE);  Surgeon: Pixie Casino, MD;  Location: [facility] ENDOSCOPY;  Service: Cardiovascular;  Laterality: N/A;  . TEE WITHOUT CARDIOVERSION N/A 11/02/2016   Procedure: TRANSESOPHAGEAL ECHOCARDIOGRAM (TEE);  Surgeon: [name], MD;  Location: [facility] ENDOSCOPY;  Service: Cardiovascular;  Laterality: N/A;  . TONSILLECTOMY    . UPPER GASTROINTESTINAL ENDOSCOPY  2003   reflux esophagitis, erosive gastropathy, duodenal ulcer    Current Outpatient Medications  Medication Sig Dispense Refill  . ACCU-CHEK AVIVA PLUS test strip CHECK BLOOD  SUGAR THREE TIMES A DAY AND AS DIRECTED. 100 each 6  . acetaminophen (TYLENOL) 500 MG tablet Take 1 tablet (500 mg total) by mouth 3 (three) times daily.    [name] apixaban (ELIQUIS) 2.5 MG TABS tablet Take 1 tablet (2.5 mg total) by mouth 2 (two) times daily. 60 tablet 6  . atorvastatin (LIPITOR) 20 MG tablet TAKE 1 TABLET DAILY. 90 tablet 0  . BAYER MICROLET LANCETS lancets Ck blood sugar three times daily and as directed. Dx 250.00. Insulin dependent 100 each 11  . citalopram (CELEXA) 20 MG tablet Take 1 tablet (20 mg total) by mouth daily. 90 tablet 1  . Cyanocobalamin (B-12) 500 MCG TABS Take 1 tablet by mouth daily.    [name] ELIQUIS 5 MG TABS tablet Take 1 tablet by mouth 2 (two) times daily.    [name] ELIQUIS 5 MG TABS  tablet Take 1 tablet (5 mg total) by mouth 2 (two) times daily. 60 tablet 2  . HYDROcodone-acetaminophen (NORCO/VICODIN) 5-325 MG tablet Take 0.5-1 tablets by mouth 2 (two) times daily as needed for moderate pain. 60 tablet 0  . Insulin Syringe-Needle U-100 (B-D INS SYR ULTRAFINE 1CC/30G) 30G X 1/2" 1 ML MISC 1 Syringe by Does not apply route as directed. 100 each 3  . Insulin Syringe-Needle U-100 (B-D INS SYRINGE 0.5CC/30GX1/2") 30G X 1/2" 0.5 ML MISC Use as directed to inject insulin. Dx: E11.65 100 each 3  . LANTUS 100 UNIT/ML injection Inject 0.35 mLs (35 Units total) into the skin at bedtime. 10 mL 2  . lisinopril (PRINIVIL,ZESTRIL) 40 MG tablet Take 1 tablet (40 mg total) by mouth daily. 90 tablet 1  . metFORMIN (GLUCOPHAGE) 1000 MG tablet Take 1 tablet (1,000 mg total) by mouth 2 (two) times daily with a meal. 180 tablet 1  . metoprolol tartrate (LOPRESSOR) 50 MG tablet Take one-half (25mg ) tablet twice a day 60 tablet 3  . pantoprazole (PROTONIX) 20 MG tablet Take 1 tablet (20 mg total) by mouth daily. 30 tablet 6  . potassium chloride SA (K-DUR,KLOR-CON) 20 MEQ tablet Take 1 tablet (20 mEq total) by mouth daily. Hold potassium on days you need spironolactone    . saw palmetto 160  MG capsule Take 450 mg by mouth daily.     . tamsulosin (FLOMAX) 0.4 MG CAPS capsule Take 1 capsule (0.4 mg total) by mouth daily after supper. 30 capsule 6  . torsemide (DEMADEX) 20 MG tablet Take 2 tablets (40 mg total) by mouth 2 (two) times daily. 120 tablet 3  . traZODone (DESYREL) 50 MG tablet Take 2 tablets (100 mg total) by mouth at bedtime. 180 tablet 0  . vitamin C (ASCORBIC ACID) 500 MG tablet Take 500 mg by mouth daily.     No current facility-administered medications for this visit.     Allergies:   Gabapentin; Spironolactone; Amlodipine; Other; Rosiglitazone maleate; and Tricor [fenofibrate]    Social History:  The patient  reports that he quit smoking about 4 years ago. His smoking use included cigarettes. He has a 18.00 pack-year smoking history. he has never used smokeless tobacco. He reports that he does not drink alcohol or use drugs.   Family History:  The patient's family history includes Cancer in his brother, brother, maternal grandmother, and mother; Diabetes in his sister; Heart attack in his maternal grandfather, paternal grandfather, and paternal grandmother; Heart disease in his father and sister; Hypertension in his father and sister; Lung disease in his brother; Stroke in his sister.    ROS:  Please see the history of present illness. All other systems are reviewed and negative.    PHYSICAL EXAM: VS:  BP (!) 177/84   Pulse (!) 44   Ht 6\' 1"  (1.854 m)   Wt 218 lb 3.2 oz (99 kg)   SpO2 95%   BMI 28.79 kg/m  , BMI Body mass index is 28.79 kg/m. GEN: Well nourished, well developed, male in moderate distress  HEENT: normal for age  Neck: JVD 12 cm, no carotid bruit (mumur radiates to carotids), no masses Cardiac: slightly irregular R&R; 2-3/6 murmur, no rubs, or gallops Respiratory: decreased BS bases bilaterally, w/ basilar rales GI: soft, nontender, nondistended, + BS MS: no deformity or atrophy; 1+ edema; distal pulses are 2+ in all 4 extremities     Skin: warm and dry, no rash Neuro:  Strength and sensation are intact  Psych: euthymic mood, full affect   EKG:  EKG is ordered today. The ekg ordered today demonstrates 2nd deg HB, Mobitz 1, HR 46  ECHO: 08/08/2017 Left ventricle: The cavity size was normal. There was severe   concentric hypertrophy. Systolic function was normal. The   estimated ejection fraction was in the range of 55% to 60%. Wall   motion was normal; there were no regional wall motion   abnormalities. Features are consistent with a pseudonormal left   ventricular filling pattern, with concomitant abnormal relaxation   and increased filling pressure (grade 2 diastolic dysfunction).   Doppler parameters are consistent with high ventricular filling   pressure. - Aortic valve: Right coronary cusp immobility was noted. There was   moderate to severe stenosis. There was trivial regurgitation.  VTI ratio of LVOT to aortic valve: 0.25. Valve area (VTI): 0.8 cm^2. Indexed valve area (VTI): 0.36 cm^2/m^2. Peak velocity ratio of LVOT to aortic valve: 0.22. Valve area (Vmax): 0.7 cm^2. Indexed valve area (Vmax): 0.32 cm^2/m^2. Mean velocity ratio of LVOT to aortic valve: 0.24. Valve area (Vmean): 0.75 cm^2. Indexed valve area (Vmean): 0.34 cm^2/m^2. Mean gradient (S): 32 mm Hg. Peak gradient (S): 58 mm Hg.   - Aorta: Ascending aortic diameter: 41 mm (S). - Ascending aorta: The ascending aorta was mildly dilated. - Mitral valve: Severely calcified annulus. Diffuse calcification   of the anterior leaflet. There was mild regurgitation. - Left atrium: The atrium was mildly dilated. - Tricuspid valve: There was trivial regurgitation. - Pulmonary arteries: PA peak pressure: 39 mm Hg (S). Impressions: - The right ventricular systolic pressure was increased consistent   with mild pulmonary hypertension.   Recent Labs: 10/28/2016: TSH 3.51 01/28/2017: Hemoglobin 13.9; Platelets 180.0; Pro B Natriuretic peptide (BNP)  537.0 09/02/2017: BUN 20; Creatinine, Ser 1.05; Potassium 4.4; Sodium 146    Lipid Panel    Component Value Date/Time   CHOL 131 06/08/2016 1423   TRIG 95.0 06/08/2016 1423   TRIG 133 09/10/2011   HDL 39.50 06/08/2016 1423   CHOLHDL 3 06/08/2016 1423   VLDL 19.0 06/08/2016 1423   LDLCALC 73 06/08/2016 1423   LDLDIRECT 77 09/10/2011     Wt Readings from Last 3 Encounters:  09/06/17 218 lb 3.2 oz (99 kg)  09/02/17 214 lb (97.1 kg)  08/24/17 213 lb (96.6 kg)     Other studies Reviewed: Additional studies/ records that were reviewed today include: Office notes, hospital records and testing.  ASSESSMENT AND PLAN: The patient was seen by Dr. [name] who agrees with the plan  1.  Acute on chronic diastolic CHF: His weight is up from his previous visit despite changing him from Lasix to Demadex.  He has significant problems with sodium indiscretion.  His sons help with his care, need to make them aware that he needs low-sodium foods.  He has grade 2 diastolic dysfunction as well as severe aortic stenosis.  These are both contributing to his CHF.  A confounding factor is the bradycardia.  Because of his weakness and significant shortness of breath, I do not believe he can be safely treated as an outpatient.  Admission for IV diuretics and further management is indicated.  His oxygen saturation is good at rest, I believe that he can be safely transported by car.  2.  Moderate/severe aortic stenosis: Peak gradient in 2016 was 49, now is 58.  Mean gradient was 28, now 32.  Valve area 0.8 cm.  My original goal was to get him to  a baseline respiratory status and then discuss referral to the TAVR clinic with Dr. [name].  It may be a good idea to do this while he is in the hospital.  3.  Symptomatic bradycardia: He has been on metoprolol 25 mg twice daily for a long time.  At his office visit 11/30, his heart rate was 78.  There was some low heart rates are at the beginning of the year, but he was  asymptomatic at the time unless he was in atrial flutter.  The underlying rhythm is sinus today with Mobitz 1 second-degree AV block  4.  Chronic anticoagulation: He is having no symptoms of bleeding on the Eliquis.   Current medicines are reviewed at length with the patient today.  The patient does not have concerns regarding medicines.  The following changes have been made: Discontinue beta-blocker, admit for IV diuresis  Labs/ tests ordered today include: n/a   Disposition:   FU with Dr [name]  Signed, Rosaria Ferries, PA-C  09/06/2017 9:06 AM    [place] Phone: [phone]; Fax: [phone]  This note was written with the assistance of speech recognition software. Please excuse any transcriptional errors.

## 2017-09-06 NOTE — ED Provider Notes (Signed)
[facility] EMERGENCY DEPARTMENT Provider Note   CSN: [id number] Arrival date & time: 09/06/17  1008     History   Chief Complaint Chief Complaint  Patient presents with  . Shortness of Breath    HPI Charles [name] is a 83 y.o. male with history of CABG (2004), CHF (G2DD), HTN, HLD, CAD, DM, Aflutter on Eliquis and symptomatic bradycardia who presents to the ED today for shortness of breath.  Patient was sent over from [place] for admission of acute on chronic diastolic CHF due to weakness and significant shortness of breath for IV diuretics therapy.   Per patient and chart review. Patient first noted that he had lower extremity swelling approximately 4 weeks ago.  Over the last 1-2 weeks he felt his breathing has gotten worse and he has become more short of breath.  He now cannot walk more than 15 feet without becoming severely short of breath and weak.  He notes prior to 2 weeks ago he was able to walk approximately 60 feet without becoming short of breath.  Patient also notes that when he lies down at night (he lies on his left side normally) it has become very difficult for him to breathe while lying down.  He now props himself up at night on a large pillow as well as small pillow in order to feel less short of breath.  Patient's baseline weight is 195 and currently weighs 218.  On 11/30 visit to cardiologies office, the patient was up 9 pounds from baseline and switched from Lasix 80 mg twice daily to Demadex 40 mg twice daily.  Since visit on 11/30 the patient has had no change in lower extremity edema and no weight loss (up additional 14lbs).   He admits to eating salty foods but does not add salt to his diet.  The patient lives at home alone but has 3 sons and a daughter that help care for him.  The family make sure that the patient takes every single one of his medications.  He denies missing any of his medications. Son is present during  history taking and notes that he lives within 0.5 miles of the patient and his brother and sister visit daily to check on their father. The patient quit smoking 2 years ago. He has a 36 pack year history. The patient denies fever, chills, myalgia's, arthralgia's, chest pain, cough, night sweats, travel,  hemoptysis, sinus pain or rhinorrhea.  He is not on home O2.   HPI  Past Medical History:  Diagnosis Date  . Abdominal aortic atherosclerosis ([place])    by xray  . Aortic stenosis 06/2014   moderate-severe  . Arthritis    in lower back (06/24/2015)  . Atrial flutter ([place])    notes 06/24/2015  . 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 CAD (coronary artery disease) 2004   a. s/p CABG in 2004 with LIMA-LAD, SVG-RCA, SVG-OM, and SVG-RI  . Carotid stenosis 1999   s/p L CEA  . CHF (congestive heart failure) ([place]) dx'd 06/2015   a. EF 55-60% by echo in 2016. b. 10/2016: EF reduced to 30-35% by TEE in the setting of atrial fibrillation with RVR  . Colon polyp 2005   (Dr. Tiffany Kocher)  . Compression fracture of L1 lumbar vertebra (HCC) remote  . Compression fracture of L1 lumbar vertebra (HCC) 02/2016   chronic by xray  . COPD (chronic obstructive pulmonary disease) ([place])  03/2011   by xray  . DDD (degenerative disc disease), lumbar 2014   severe L2/3 L5/S1 by xray  . Depression    hx  . Ex-smoker   . GERD (gastroesophageal reflux disease) 2003   h/o duodenal ulcer per EGD as well as esophagitis  . History of chicken pox   . History of colon polyps 2003, 2005   adenomatous Vira Agar)  . HLD (hyperlipidemia)   . HTN (hypertension)   . Psoriasis   . Renal artery stenosis ([place]) 2004   70% bilateral, followed by cards  . Seasonal allergies   . T2DM (type 2 diabetes mellitus) ([place]) 1995  . Urge incontinence of urine     Patient Active Problem List   Diagnosis Date Noted  . Acute bacterial sinusitis 07/14/2017  . Renal insufficiency 07/01/2017  . Urinary urgency  07/01/2017  . Prostatitis 01/28/2017  . Exertional dyspnea 01/28/2017  . Gross hematuria 11/25/2016  . Chronic systolic congestive heart failure ([place]) 11/16/2016  . Chronic anticoagulation 11/05/2016  . Bradycardia 10/28/2016  . Chronic radicular pain of lower back 02/17/2016  . Typical atrial flutter ([place]) 06/11/2015  . Advanced care planning/counseling discussion 10/08/2014  . Health maintenance examination 10/08/2014  . Aortic stenosis 06/04/2014  . DDD (degenerative disc disease), lumbar 10/02/2013  . Medicare annual wellness visit, subsequent 10/02/2013  . Ex-smoker   . Carotid arterial disease ([place]) 08/23/2013  . Renal artery stenosis ([place]) 08/23/2013  . MDD (major depressive disorder), recurrent episode, moderate ([place]) 09/21/2007  . OSTEOARTHRITIS 09/21/2007  . COLONIC POLYPS 01/31/2007  . Diabetes mellitus type 2, uncontrolled, with complications ([place]) 92/08/9416  . HYPERCHOLESTEROLEMIA 01/31/2007  . ERECTILE DYSFUNCTION 01/31/2007  . Essential hypertension 01/31/2007  . Coronary atherosclerosis 01/31/2007  . Cardiovascular disease 01/31/2007  . ALLERGIC RHINITIS 01/31/2007  . GERD 01/31/2007  . PSORIASIS 01/31/2007    Past Surgical History:  Procedure Laterality Date  . CARDIAC CATHETERIZATION  03/06/2003   No intervention - recommend CABG  . CARDIOVASCULAR STRESS TEST  11/2010   normal perfusion, no evidence of ischemia, EF 62% post exercise  . CARDIOVASCULAR STRESS TEST  11/28/2012   Mild diaphragmatic attenuation; cannot exclude a focal region of nontransmural inferior scar  . CARDIOVERSION N/A 06/25/2015   Procedure: CARDIOVERSION;  Surgeon: Pixie Casino, MD;  Location: [facility] ENDOSCOPY;  Service: Cardiovascular;  Laterality: N/A;  . CARDIOVERSION N/A 11/02/2016   Procedure: CARDIOVERSION;  Surgeon: [name], MD;  Location: [facility];  Service: Cardiovascular;  Laterality: N/A;  . CAROTID ENDARTERECTOMY Left 1999   ([place])  . CATARACT EXTRACTION W/  INTRAOCULAR LENS  IMPLANT, BILATERAL Bilateral 01/2013   [place]  . COLONOSCOPY  2003   colon polyp x3 - adenomatous Tiffany Kocher)  . COLONOSCOPY  10/08/2012   2 TA, diverticulosis, int hem, no rpt rec Tiffany Kocher)  . CORONARY ANGIOPLASTY    . CORONARY ARTERY BYPASS GRAFT  03/07/2003   4v CABG ([place]) with LIMA to LAD, vein graft to RCA, 1st obtuse marginal, and ramus intermedius  . ESOPHAGOGASTRODUODENOSCOPY  10/08/2012   nl esophagus, duodenitis and erosive gastropathy, path - gastropathy no Hpylori, no rpt rec  . LUMBAR EPIDURAL INJECTION  03/2017   L5/S1 ([place])  . PERIPHERAL VASCULAR CATHETERIZATION N/A 02/02/2016   Procedure: Renal Angiography;  Surgeon: [name], MD;  Location: [facility] CV LAB;  Service: Cardiovascular;  Laterality: N/A;  . PERIPHERAL VASCULAR CATHETERIZATION N/A 02/02/2016   Procedure: Abdominal Aortogram;  Surgeon: [name], MD;  Location: [facility] CV LAB;  Service: Cardiovascular;  Laterality: N/A;  . RENAL DOPPLER  11/29/2011   Celiac&SMA-demonstrated vessel narrowing suggestive of a greater than 50% diameter reduction. Bilateral renal arteries-demonstrated vessel narrowing of 60-99% diameter reduction. Rt Kidney-mid pole lateral simple cyst noted measuring 1.29x0.76x1.11cm and exophytic cyst outside lower pole measuring 1.23x0.96x1.31. Lft Kidney-lateral mid to lower pole simple cyst measuering-1.24x9.83x1.24  . TEE WITHOUT CARDIOVERSION N/A 06/25/2015   Procedure: TRANSESOPHAGEAL ECHOCARDIOGRAM (TEE);  Surgeon: Pixie Casino, MD;  Location[facility] ENDOSCOPY;  Service: Cardiovascular;  Laterality: N/A;  . TEE WITHOUT CARDIOVERSION N/A 11/02/2016   Procedure: TRANSESOPHAGEAL ECHOCARDIOGRAM (TEE);  Surgeon: [name], MD;  Location: [facility] ENDOSCOPY;  Service: Cardiovascular;  Laterality: N/A;  . TONSILLECTOMY    . UPPER GASTROINTESTINAL ENDOSCOPY  2003   reflux esophagitis, erosive gastropathy, duodenal ulcer       Home Medications    Prior to Admission  medications   Medication Sig Start Date End Date Taking? Authorizing Provider  acetaminophen (TYLENOL) 500 MG tablet Take 1 tablet (500 mg total) by mouth 3 (three) times daily. 02/17/16  Yes [name], MD  atorvastatin (LIPITOR) 20 MG tablet TAKE 1 TABLET DAILY. 07/07/17  Yes [name], MD  citalopram (CELEXA) 20 MG tablet Take 1 tablet (20 mg total) by mouth daily. 05/16/17  Yes [name], MD  Cyanocobalamin (B-12) 500 MCG TABS Take 1 tablet by mouth daily.   Yes [provider]  ELIQUIS 5 MG TABS tablet Take 1 tablet (5 mg total) by mouth 2 (two) times daily. 08/01/17  Yes [name], MD  fluticasone [facility]) 50 MCG/ACT nasal spray Place 2 sprays into both nostrils as needed for allergies or rhinitis.   Yes [provider]  LANTUS 100 UNIT/ML injection Inject 0.35 mLs (35 Units total) into the skin at bedtime. 08/08/17  Yes [name], MD  lisinopril (PRINIVIL,ZESTRIL) 40 MG tablet Take 1 tablet (40 mg total) by mouth daily. 05/23/17  Yes [name], MD  metFORMIN (GLUCOPHAGE) 1000 MG tablet Take 1 tablet (1,000 mg total) by mouth 2 (two) times daily with a meal. 12/20/16  Yes [name], MD  pantoprazole (PROTONIX) 20 MG tablet Take 1 tablet (20 mg total) by mouth daily. 03/07/17  Yes [name], MD  potassium chloride SA (K-DUR,KLOR-CON) 20 MEQ tablet Take 1 tablet (20 mEq total) by mouth daily. Hold potassium on days you need spironolactone 02/04/17  Yes [name], MD  saw palmetto 160 MG capsule Take 450 mg by mouth daily.    Yes [provider]  tamsulosin (FLOMAX) 0.4 MG CAPS capsule Take 1 capsule (0.4 mg total) by mouth daily after supper. 07/01/17  Yes [name], MD  traZODone (DESYREL) 50 MG tablet Take 2 tablets (100 mg total) by mouth at bedtime. 07/08/17  Yes [name], MD  vitamin C (ASCORBIC ACID) 500 MG tablet Take 500 mg by mouth daily.   Yes [provider]  ACCU-CHEK  AVIVA PLUS test strip CHECK BLOOD SUGAR THREE TIMES A DAY AND AS DIRECTED. 06/29/17   [name], MD  apixaban (ELIQUIS) 2.5 MG TABS tablet Take 1 tablet (2.5 mg total) by mouth 2 (two) times daily. Patient not taking: Reported on 09/06/2017 11/25/16   [name], MD  BAYER MICROLET LANCETS lancets Ck blood sugar three times daily and as directed. Dx 250.00. Insulin dependent 06/19/13   [name], MD  ELIQUIS 5 MG TABS tablet Take 1 tablet by mouth 2 (two) times daily. 07/04/17   [provider]  HYDROcodone-acetaminophen (NORCO/VICODIN) 5-325  MG tablet Take 0.5-1 tablets by mouth 2 (two) times daily as needed for moderate pain. Patient not taking: Reported on 09/06/2017 06/11/16   [name], MD  Insulin Syringe-Needle U-100 (B-D INS SYR ULTRAFINE 1CC/30G) 30G X 1/2" 1 ML MISC 1 Syringe by Does not apply route as directed. 02/04/15   [name], MD  Insulin Syringe-Needle U-100 (B-D INS SYRINGE 0.5CC/30GX1/2") 30G X 1/2" 0.5 ML MISC Use as directed to inject insulin. Dx: E11.65 09/06/16   [name], MD  torsemide (DEMADEX) 20 MG tablet Take 2 tablets (40 mg total) by mouth 2 (two) times daily. Patient not taking: Reported on 09/06/2017 09/02/17   Barrett, Evelene Croon, PA-C    Family History Family History  Problem Relation Age of Onset  . Hypertension Father   . Heart disease Father   . Cancer Mother        GI cancer  . Diabetes Sister   . Heart disease Sister   . Stroke Sister   . Hypertension Sister   . Cancer Brother        Lung cancer - nonsmoker  . Cancer Maternal Grandmother   . Heart attack Maternal Grandfather   . Heart attack Paternal Grandmother   . Heart attack Paternal Grandfather   . Cancer Brother        Pancreatic cancer  . Lung disease Brother     Social History Social History   Tobacco Use  . Smoking status: Former Smoker    Packs/day: 0.50    Years: 36.00    Pack years: 18.00    Types: Cigarettes    Last attempt to  quit: 07/23/2013    Years since quitting: 4.1  . Smokeless tobacco: Never Used  Substance Use Topics  . Alcohol use: No  . Drug use: No     Allergies   Gabapentin; Spironolactone; Amlodipine; Other; Rosiglitazone maleate; and Tricor [fenofibrate]   Review of Systems Review of Systems   Physical Exam Updated Vital Signs BP (!) 140/103 (BP Location: Left Arm)   Pulse 61   Temp 98 F (36.7 C) (Oral)   Resp 17   SpO2 95%   Physical Exam  Constitutional: He appears well-developed and well-nourished.  HENT:  Head: Normocephalic and atraumatic.  Right Ear: External ear normal.  Left Ear: External ear normal.  Nose: Nose normal.  Mouth/Throat: Uvula is midline, oropharynx is clear and moist and mucous membranes are normal. No tonsillar exudate.  Eyes: Pupils are equal, round, and reactive to light. Right eye exhibits no discharge. Left eye exhibits no discharge. No scleral icterus.  Neck: Trachea normal. Neck supple. JVD present. No spinous process tenderness present. Carotid bruit is not present. No neck rigidity. Normal range of motion present.  Cardiovascular: Normal rate and intact distal pulses. An irregular rhythm present.  Murmur heard. Pulses:      Radial pulses are 2+ on the right side, and 2+ on the left side.  1+ lower extremity swelling to mid-tibia b/l. Doppler pulses of DP and PT intact.   Pulmonary/Chest: Effort normal and breath sounds normal. No accessory muscle usage. No tachypnea. No respiratory distress. He exhibits no tenderness.  Abdominal: Soft. Bowel sounds are normal. There is hepatomegaly. There is no tenderness. There is no rebound, no guarding and no CVA tenderness.  Musculoskeletal: He exhibits no edema.  Lymphadenopathy:    He has no cervical adenopathy.  Neurological: He is alert.  Skin: Skin is warm and dry. No rash noted. He is not diaphoretic.  Psychiatric: He has a normal mood and affect.  Nursing note and vitals reviewed.    ED  Treatments / Results  Labs (all labs ordered are listed, but only abnormal results are displayed) Labs Reviewed  BASIC METABOLIC PANEL - Abnormal; Notable for the following components:      Result Value   Chloride 98 (*)    Glucose, Bld 248 (*)    All other components within normal limits  CBC - Abnormal; Notable for the following components:   Hemoglobin 12.8 (*)    All other components within normal limits  BRAIN NATRIURETIC PEPTIDE - Abnormal; Notable for the following components:   B Natriuretic Peptide 587.9 (*)    All other components within normal limits  I-STAT TROPONIN, ED    EKG  EKG Interpretation  Date/Time:  Tuesday September 06 2017 10:13:56 EST Ventricular Rate:  52 PR Interval:    QRS Duration: 98 QT Interval:  488 QTC Calculation: 453 R Axis:   52 Text Interpretation:  1st degree av block Sinus bradycardia with 2nd degree A-V block ([place]ob[place] I) Anteroseptal infarct , age undetermined Abnormal ECG Confirmed by Dene Gentry [id number]) on 09/06/2017 5:17:37 PM       Radiology Dg Chest 2 View  Result Date: 09/06/2017 CLINICAL DATA:  Cough and short of breath.  Diabetic hypertension EXAM: CHEST  2 VIEW COMPARISON:  11/01/2016 FINDINGS: CABG.  Negative for heart failure. Mild bibasilar atelectasis.  No definite pneumonia or effusion. IMPRESSION: No active cardiopulmonary disease. Electronically Signed   By: Franchot Gallo M.D.   On: 09/06/2017 11:03    Procedures Procedures (including critical care time)  Medications Ordered in ED Medications  atorvastatin (LIPITOR) tablet 20 mg (not administered)  citalopram (CELEXA) tablet 20 mg (not administered)  B-12 TABS 1 tablet (not administered)  apixaban (ELIQUIS) tablet 5 mg (not administered)  fluticasone (FLONASE) 50 MCG/ACT nasal spray 2 spray (not administered)  insulin glargine (LANTUS) injection 35 Units (not administered)  lisinopril (PRINIVIL,ZESTRIL) tablet 40 mg (not administered)  pantoprazole (PROTONIX)  EC tablet 20 mg (not administered)  potassium chloride SA (K-DUR,KLOR-CON) CR tablet 20 mEq (not administered)  tamsulosin (FLOMAX) capsule 0.4 mg (not administered)  traZODone (DESYREL) tablet 100 mg (not administered)  vitamin C (ASCORBIC ACID) tablet 500 mg (not administered)  furosemide (LASIX) injection 80 mg (80 mg Intravenous Given 09/06/17 1608)     Initial Impression / Assessment and Plan / ED Course  I have reviewed the triage vital signs and the nursing notes.  Pertinent labs & imaging results that were available during my care of the patient were reviewed by me and considered in my medical decision making (see chart for details).     Patient sent over for acute on chronic CHF from cardiology office.  Baseline weight of 195 now up to 218 pounds.  He is failed outpatient therapy (recently switched from lasix to [facility]) and requires IV diuretics.  Patient's vitals are reassuring and the patient is without fever, tachycardia, tachypnea, hypoxia or hypotension.  He is non-septic appearing.  Patient is satting at 95% on room air.  He does not appear to be in any respiratory distress.  He denies any current chest pain.  Labs, ECG and CXR ordered prior to my evaluation. Tn 0.06. BMP reassuring besides elevated glucose. No anion gap to suggest DKA. Kidney function reassuring.  No leukocytosis.  Mild anemia at 12.8.  CBC otherwise reassuring.  Chest x-ray reassuring. BNP elevated at 587.9. ECG bradycardic and 1st degree av  block (old). No ischemic changes.  Patient appears stable at this time and no respiratory distress, satting at 95% on room air. Will call for admission. Spoke with Wannetta Sender of cardiology who says the patient will be admitted. Patient and family made aware.  Patient appears safe for admission.   Final Clinical Impressions(s) / ED Diagnoses   Final diagnoses:  Acute on chronic diastolic CHF (congestive heart failure) (HCC)  SOB (shortness of breath)    ED Discharge Orders     None       Lorelle Gibbs 09/06/17 1718    Valarie Merino, MD 09/06/17 1721

## 2017-09-06 NOTE — Progress Notes (Deleted)
Cardiology Office Note   Date:  09/06/2017   ID:  [name], DOB 1932/02/28, MRN [id number]  PCP:  [name], MD  Cardiologist: Dr. [name], 01/19/2017 Rosaria Ferries, PA-C 09/02/2017  No chief complaint on file.   History of Present Illness: Charles [name] is a 83 y.o. male with a history of CABG 2004 w/ LIMA-LAD, SVG-RCA, SVG-OM, SVG-RI, RAS w/ bilat 70% dz, s/p L-CEA, HTN, HLD, NIDDM, MV 2014 no ischemia, A flutter dx 2016 s/p TEE/DCCV 11/02/2016, on Eliquis w/ CHADS2VASC=5 (age x 2, HTN, CAD, DM), mod-severe AS by echo 2016 (peak gradient 49)  11/30 office visit, breathing no better but edema improved on increased Lasix, did not have scales so no weights.  Lasix 80 mg twice daily changed to Demadex 40 mg twice daily, early follow-up with labs  Charles [name] presents for ***   Past Medical History:  Diagnosis Date  . Abdominal aortic atherosclerosis ([place])    by xray  . Aortic stenosis 06/2014   moderate-severe  . Arthritis    in lower back (06/24/2015)  . Atrial flutter ([place])    notes 06/24/2015  . 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 CAD (coronary artery disease) 2004   a. s/p CABG in 2004 with LIMA-LAD, SVG-RCA, SVG-OM, and SVG-RI  . Carotid stenosis 1999   s/p L CEA  . CHF (congestive heart failure) ([place]) dx'd 06/2015   a. EF 55-60% by echo in 2016. b. 10/2016: EF reduced to 30-35% by TEE in the setting of atrial fibrillation with RVR  . Colon polyp 2005   (Dr. Tiffany Kocher)  . Compression fracture of L1 lumbar vertebra (HCC) remote  . Compression fracture of L1 lumbar vertebra (HCC) 02/2016   chronic by xray  . COPD (chronic obstructive pulmonary disease) ([place]) 03/2011   by xray  . DDD (degenerative disc disease), lumbar 2014   severe L2/3 L5/S1 by xray  . Depression    hx  . Ex-smoker   . GERD (gastroesophageal reflux disease) 2003   h/o duodenal ulcer per EGD as well as esophagitis  . History of chicken pox   .  History of colon polyps 2003, 2005   adenomatous Vira Agar)  . HLD (hyperlipidemia)   . HTN (hypertension)   . Psoriasis   . Renal artery stenosis ([place]) 2004   70% bilateral, followed by cards  . Seasonal allergies   . T2DM (type 2 diabetes mellitus) ([place]) 1995  . Urge incontinence of urine     Past Surgical History:  Procedure Laterality Date  . CARDIAC CATHETERIZATION  03/06/2003   No intervention - recommend CABG  . CARDIOVASCULAR STRESS TEST  11/2010   normal perfusion, no evidence of ischemia, EF 62% post exercise  . CARDIOVASCULAR STRESS TEST  11/28/2012   Mild diaphragmatic attenuation; cannot exclude a focal region of nontransmural inferior scar  . CARDIOVERSION N/A 06/25/2015   Procedure: CARDIOVERSION;  Surgeon: Pixie Casino, MD;  Location: [facility] ENDOSCOPY;  Service: Cardiovascular;  Laterality: N/A;  . CARDIOVERSION N/A 11/02/2016   Procedure: CARDIOVERSION;  Surgeon: [name], MD;  Location: [place];  Service: Cardiovascular;  Laterality: N/A;  . CAROTID ENDARTERECTOMY Left 1999   ([place])  . CATARACT EXTRACTION W/ INTRAOCULAR LENS  IMPLANT, BILATERAL Bilateral 01/2013   [place]  . COLONOSCOPY  2003   colon polyp x3 - adenomatous Tiffany Kocher)  . COLONOSCOPY  10/08/2012   2 TA, diverticulosis, int hem, no rpt rec Tiffany Kocher)  .  CORONARY ANGIOPLASTY    . CORONARY ARTERY BYPASS GRAFT  03/07/2003   4v CABG ([place]) with LIMA to LAD, vein graft to RCA, 1st obtuse marginal, and ramus intermedius  . ESOPHAGOGASTRODUODENOSCOPY  10/08/2012   nl esophagus, duodenitis and erosive gastropathy, path - gastropathy no Hpylori, no rpt rec  . LUMBAR EPIDURAL INJECTION  03/2017   L5/S1 ([place])  . PERIPHERAL VASCULAR CATHETERIZATION N/A 02/02/2016   Procedure: Renal Angiography;  Surgeon: [name], MD;  Location: [place] CV LAB;  Service: Cardiovascular;  Laterality: N/A;  . PERIPHERAL VASCULAR CATHETERIZATION N/A 02/02/2016   Procedure: Abdominal Aortogram;  Surgeon: [name], MD;  Location: [place] CV LAB;  Service: Cardiovascular;  Laterality: N/A;  . RENAL DOPPLER  11/29/2011   Celiac&SMA-demonstrated vessel narrowing suggestive of a greater than 50% diameter reduction. Bilateral renal arteries-demonstrated vessel narrowing of 60-99% diameter reduction. Rt Kidney-mid pole lateral simple cyst noted measuring 1.29x0.76x1.11cm and exophytic cyst outside lower pole measuring 1.23x0.96x1.31. Lft Kidney-lateral mid to lower pole simple cyst measuering-1.24x9.83x1.24  . TEE WITHOUT CARDIOVERSION N/A 06/25/2015   Procedure: TRANSESOPHAGEAL ECHOCARDIOGRAM (TEE);  Surgeon: Pixie Casino, MD;  Location[facility] ENDOSCOPY;  Service: Cardiovascular;  Laterality: N/A;  . TEE WITHOUT CARDIOVERSION N/A 11/02/2016   Procedure: TRANSESOPHAGEAL ECHOCARDIOGRAM (TEE);  Surgeon: [name], MD;  Location[facility] ENDOSCOPY;  Service: Cardiovascular;  Laterality: N/A;  . TONSILLECTOMY    . UPPER GASTROINTESTINAL ENDOSCOPY  2003   reflux esophagitis, erosive gastropathy, duodenal ulcer    Current Outpatient Medications  Medication Sig Dispense Refill  . ACCU-CHEK AVIVA PLUS test strip CHECK BLOOD SUGAR THREE TIMES A DAY AND AS DIRECTED. 100 each 6  . acetaminophen (TYLENOL) 500 MG tablet Take 1 tablet (500 mg total) by mouth 3 (three) times daily.    [name] apixaban (ELIQUIS) 2.5 MG TABS tablet Take 1 tablet (2.5 mg total) by mouth 2 (two) times daily. 60 tablet 6  . atorvastatin (LIPITOR) 20 MG tablet TAKE 1 TABLET DAILY. 90 tablet 0  . BAYER MICROLET LANCETS lancets Ck blood sugar three times daily and as directed. Dx 250.00. Insulin dependent 100 each 11  . citalopram (CELEXA) 20 MG tablet Take 1 tablet (20 mg total) by mouth daily. 90 tablet 1  . Cyanocobalamin (B-12) 500 MCG TABS Take 1 tablet by mouth daily.    [name] ELIQUIS 5 MG TABS tablet Take 1 tablet by mouth 2 (two) times daily.    [name] ELIQUIS 5 MG TABS tablet Take 1 tablet (5 mg total) by mouth 2 (two) times daily. 60 tablet 2    . HYDROcodone-acetaminophen (NORCO/VICODIN) 5-325 MG tablet Take 0.5-1 tablets by mouth 2 (two) times daily as needed for moderate pain. 60 tablet 0  . Insulin Syringe-Needle U-100 (B-D INS SYR ULTRAFINE 1CC/30G) 30G X 1/2" 1 ML MISC 1 Syringe by Does not apply route as directed. 100 each 3  . Insulin Syringe-Needle U-100 (B-D INS SYRINGE 0.5CC/30GX1/2") 30G X 1/2" 0.5 ML MISC Use as directed to inject insulin. Dx: E11.65 100 each 3  . LANTUS 100 UNIT/ML injection Inject 0.35 mLs (35 Units total) into the skin at bedtime. 10 mL 2  . lisinopril (PRINIVIL,ZESTRIL) 40 MG tablet Take 1 tablet (40 mg total) by mouth daily. 90 tablet 1  . metFORMIN (GLUCOPHAGE) 1000 MG tablet Take 1 tablet (1,000 mg total) by mouth 2 (two) times daily with a meal. 180 tablet 1  . metoprolol tartrate (LOPRESSOR) 50 MG tablet Take one-half (25mg ) tablet twice a day 60  tablet 3  . pantoprazole (PROTONIX) 20 MG tablet Take 1 tablet (20 mg total) by mouth daily. 30 tablet 6  . potassium chloride SA (K-DUR,KLOR-CON) 20 MEQ tablet Take 1 tablet (20 mEq total) by mouth daily. Hold potassium on days you need spironolactone    . saw palmetto 160 MG capsule Take 450 mg by mouth daily.     . tamsulosin (FLOMAX) 0.4 MG CAPS capsule Take 1 capsule (0.4 mg total) by mouth daily after supper. 30 capsule 6  . torsemide (DEMADEX) 20 MG tablet Take 2 tablets (40 mg total) by mouth 2 (two) times daily. 120 tablet 3  . traZODone (DESYREL) 50 MG tablet Take 2 tablets (100 mg total) by mouth at bedtime. 180 tablet 0  . vitamin C (ASCORBIC ACID) 500 MG tablet Take 500 mg by mouth daily.     No current facility-administered medications for this visit.     Allergies:   Gabapentin; Spironolactone; Amlodipine; Other; Rosiglitazone maleate; and Tricor [fenofibrate]    Social History:  The patient  reports that he quit smoking about 4 years ago. His smoking use included cigarettes. He has a 18.00 pack-year smoking history. he has never used  smokeless tobacco. He reports that he does not drink alcohol or use drugs.   Family History:  The patient's family history includes Cancer in his brother, brother, maternal grandmother, and mother; Diabetes in his sister; Heart attack in his maternal grandfather, paternal grandfather, and paternal grandmother; Heart disease in his father and sister; Hypertension in his father and sister; Lung disease in his brother; Stroke in his sister.    ROS:  Please see the history of present illness. All other systems are reviewed and negative.    PHYSICAL EXAM: VS:  There were no vitals taken for this visit. , BMI There is no height or weight on file to calculate BMI. GEN: Well nourished, well developed, male in no acute distress  HEENT: normal for age  Neck: no JVD, no carotid bruit, no masses Cardiac: RRR; no murmur, no rubs, or gallops Respiratory:  clear to auscultation bilaterally, normal work of breathing GI: soft, nontender, nondistended, + BS MS: no deformity or atrophy; no edema; distal pulses are 2+ in all 4 extremities   Skin: warm and dry, no rash Neuro:  Strength and sensation are intact Psych: euthymic mood, full affect   EKG:  EKG {ACTION; IS/IS AVW:[id number]} ordered today. The ekg ordered today demonstrates ***  ECHO: 08/08/2017 - Left ventricle: The cavity size was normal. There was severe concentric hypertrophy. Systolic function was normal. The estimated ejection fraction was in the range of 55% to 60%. Wall motion was normal; there were no regional wall motion abnormalities. Features are consistent with a pseudonormal left ventricular filling pattern, with concomitant abnormal relaxation and increased filling pressure (grade 2 diastolic dysfunction). Doppler parameters are consistent with high ventricular fillingpressure. - Aortic valve: Right coronary cusp immobility was noted. There was moderate to severe stenosis. There was trivial  regurgitation. Mean gradient (S): 32 mm Hg. - Aorta: Ascending aortic diameter: 41 mm (S). - Ascending aorta: The ascending aorta was mildly dilated. - Mitral valve: Severely calcified annulus. Diffuse calcification of the anterior leaflet. There was mild regurgitation. - Left atrium: The atrium was mildly dilated. - Tricuspid valve: There was trivial regurgitation. - Pulmonary arteries: PA peak pressure: 39 mm Hg (S). Impressions: - The right ventricular systolic pressure was increased consistent with mild pulmonary hypertension.   Recent Labs: 10/28/2016: TSH 3.51 01/28/2017: Hemoglobin  13.9; Platelets 180.0; Pro B Natriuretic peptide (BNP) 537.0 09/02/2017: BUN 20; Creatinine, Ser 1.05; Potassium 4.4; Sodium 146    Lipid Panel    Component Value Date/Time   CHOL 131 06/08/2016 1423   TRIG 95.0 06/08/2016 1423   TRIG 133 09/10/2011   HDL 39.50 06/08/2016 1423   CHOLHDL 3 06/08/2016 1423   VLDL 19.0 06/08/2016 1423   LDLCALC 73 06/08/2016 1423   LDLDIRECT 77 09/10/2011     Wt Readings from Last 3 Encounters:  09/02/17 214 lb (97.1 kg)  08/24/17 213 lb (96.6 kg)  07/14/17 205 lb (93 kg)     Other studies Reviewed: Additional studies/ records that were reviewed today include: ***.  ASSESSMENT AND PLAN:  1.  ***   Current medicines are reviewed at length with the patient today.  The patient {ACTIONS; HAS/DOES NOT HAVE:19233} concerns regarding medicines.  The following changes have been made:  {PLAN; NO CHANGE:13088:s}  Labs/ tests ordered today include: *** No orders of the defined types were placed in this encounter.    Disposition:   FU with Dr. [name]  Signed, Rosaria Ferries, PA-C  09/06/2017 8:18 AM    [place] Phone: [phone]; Fax: [phone]  This note was written with the assistance of speech recognition software. Please excuse any transcriptional errors.

## 2017-09-06 NOTE — H&P (Signed)
Cardiology Office Note   Date:  09/06/2017   ID:  Charles [name], DOB December 07, 1931, MRN [id number]  PCP:  [name], MD    Cardiologist:  Dr [name], 01/19/2017  Rosaria Ferries, PA-C 09/02/2017      Chief Complaint  Patient presents with  . Shortness of Breath    pt states some SOB, feels like he can"t breath when lying down. also states he isn't feeling well today states he feels like he is about to pass out   . Edema    both ankles    History of Present Illness: Charles [name] is a 83 y.o. male with a history of CABG 2004 w/ LIMA-LAD, SVG-RCA, SVG-OM, SVG-RI, RAS w/ bilat 70% dz, s/p L-CEA, HTN, HLD, NIDDM, MV 2014 no ischemia, A flutter dx 2016 s/p TEE/DCCV 11/02/2016, on Eliquis w/ CHADS2VASC=5 (age x 2, HTN, CAD, DM), mod-severe AS by echo 2016 (peak gradient 49)  11/30 office visit, wt up 9 lbs, Lasix 80 mg bid changed to Demadex 40 mg bid. F/u w/ labs.  Charles [name] presents for cardiology follow up.  He lives alone, has 3 sons and a daughter that help in his care.   He feels terrible. He feels very weak and SOB with minimal activity. No weight loss. No change in LE edema. He gets light-headed if he tries to do anything. Gets extremely SOB going to the bathroom. Cannot walk any distance.   Has not had chest pain or palpitations.  He cannot sleep at night, every night. He has not discussed this with his PCP recently. Not due to SOB, he thinks, it is from grief over his wife's death 2 yr ago.   Sugars are running ok.  He eats a Consulting civil engineer sausage, egg and cheese biscuit every morning (830 mg Na). He does not use salt, but admits it may be in the food he eats.        Past Medical History:  Diagnosis Date  . Abdominal aortic atherosclerosis ([place])    by xray  . Aortic stenosis 06/2014   moderate-severe  . Arthritis    in lower back (06/24/2015)  . Atrial flutter ([place])    notes 06/24/2015  . 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 CAD (coronary artery disease) 2004   a. s/p CABG in 2004 with LIMA-LAD, SVG-RCA, SVG-OM, and SVG-RI  . Carotid stenosis 1999   s/p L CEA  . CHF (congestive heart failure) ([place]) dx'd 06/2015   a. EF 55-60% by echo in 2016. b. 10/2016: EF reduced to 30-35% by TEE in the setting of atrial fibrillation with RVR  . Colon polyp 2005   (Dr. Tiffany Kocher)  . Compression fracture of L1 lumbar vertebra (HCC) remote  . Compression fracture of L1 lumbar vertebra (HCC) 02/2016   chronic by xray  . COPD (chronic obstructive pulmonary disease) ([place]) 03/2011   by xray  . DDD (degenerative disc disease), lumbar 2014   severe L2/3 L5/S1 by xray  . Depression    hx  . Ex-smoker   . GERD (gastroesophageal reflux disease) 2003   h/o duodenal ulcer per EGD as well as esophagitis  . History of chicken pox   . History of colon polyps 2003, 2005   adenomatous Vira Agar)  . HLD (hyperlipidemia)   . HTN (hypertension)   . Psoriasis   . Renal artery stenosis ([place]) 2004   70% bilateral, followed by cards  . Seasonal allergies   .  T2DM (type 2 diabetes mellitus) ([place]) 1995  . Urge incontinence of urine          Past Surgical History:  Procedure Laterality Date  . CARDIAC CATHETERIZATION  03/06/2003   No intervention - recommend CABG  . CARDIOVASCULAR STRESS TEST  11/2010   normal perfusion, no evidence of ischemia, EF 62% post exercise  . CARDIOVASCULAR STRESS TEST  11/28/2012   Mild diaphragmatic attenuation; cannot exclude a focal region of nontransmural inferior scar  . CARDIOVERSION N/A 06/25/2015   Procedure: CARDIOVERSION;  Surgeon: Pixie Casino, MD;  Location: [facility] ENDOSCOPY;  Service: Cardiovascular;  Laterality: N/A;  . CARDIOVERSION N/A 11/02/2016   Procedure: CARDIOVERSION;  Surgeon: [name], MD;  Location: [facility];  Service: Cardiovascular;  Laterality: N/A;  . CAROTID ENDARTERECTOMY Left 1999   ([place])  .  CATARACT EXTRACTION W/ INTRAOCULAR LENS  IMPLANT, BILATERAL Bilateral 01/2013   [place]  . COLONOSCOPY  2003   colon polyp x3 - adenomatous Tiffany Kocher)  . COLONOSCOPY  10/08/2012   2 TA, diverticulosis, int hem, no rpt rec Tiffany Kocher)  . CORONARY ANGIOPLASTY    . CORONARY ARTERY BYPASS GRAFT  03/07/2003   4v CABG ([place]) with LIMA to LAD, vein graft to RCA, 1st obtuse marginal, and ramus intermedius  . ESOPHAGOGASTRODUODENOSCOPY  10/08/2012   nl esophagus, duodenitis and erosive gastropathy, path - gastropathy no Hpylori, no rpt rec  . LUMBAR EPIDURAL INJECTION  03/2017   L5/S1 ([place])  . PERIPHERAL VASCULAR CATHETERIZATION N/A 02/02/2016   Procedure: Renal Angiography;  Surgeon: [name], MD;  Location: [facility] CV LAB;  Service: Cardiovascular;  Laterality: N/A;  . PERIPHERAL VASCULAR CATHETERIZATION N/A 02/02/2016   Procedure: Abdominal Aortogram;  Surgeon: [name], MD;  Location: [facility] LAB;  Service: Cardiovascular;  Laterality: N/A;  . RENAL DOPPLER  11/29/2011   Celiac&SMA-demonstrated vessel narrowing suggestive of a greater than 50% diameter reduction. Bilateral renal arteries-demonstrated vessel narrowing of 60-99% diameter reduction. Rt Kidney-mid pole lateral simple cyst noted measuring 1.29x0.76x1.11cm and exophytic cyst outside lower pole measuring 1.23x0.96x1.31. Lft Kidney-lateral mid to lower pole simple cyst measuering-1.24x9.83x1.24  . TEE WITHOUT CARDIOVERSION N/A 06/25/2015   Procedure: TRANSESOPHAGEAL ECHOCARDIOGRAM (TEE);  Surgeon: Pixie Casino, MD;  Location: [facility] ENDOSCOPY;  Service: Cardiovascular;  Laterality: N/A;  . TEE WITHOUT CARDIOVERSION N/A 11/02/2016   Procedure: TRANSESOPHAGEAL ECHOCARDIOGRAM (TEE);  Surgeon: [name], MD;  Location: [facility] ENDOSCOPY;  Service: Cardiovascular;  Laterality: N/A;  . TONSILLECTOMY    . UPPER GASTROINTESTINAL ENDOSCOPY  2003   reflux esophagitis, erosive gastropathy, duodenal ulcer           Current Outpatient Medications  Medication Sig Dispense Refill  . ACCU-CHEK AVIVA PLUS test strip CHECK BLOOD SUGAR THREE TIMES A DAY AND AS DIRECTED. 100 each 6  . acetaminophen (TYLENOL) 500 MG tablet Take 1 tablet (500 mg total) by mouth 3 (three) times daily.    [name] apixaban (ELIQUIS) 2.5 MG TABS tablet Take 1 tablet (2.5 mg total) by mouth 2 (two) times daily. 60 tablet 6  . atorvastatin (LIPITOR) 20 MG tablet TAKE 1 TABLET DAILY. 90 tablet 0  . BAYER MICROLET LANCETS lancets Ck blood sugar three times daily and as directed. Dx 250.00. Insulin dependent 100 each 11  . citalopram (CELEXA) 20 MG tablet Take 1 tablet (20 mg total) by mouth daily. 90 tablet 1  . Cyanocobalamin (B-12) 500 MCG TABS Take 1 tablet by mouth daily.    [name] ELIQUIS 5 MG TABS tablet  Take 1 tablet by mouth 2 (two) times daily.    [name] ELIQUIS 5 MG TABS tablet Take 1 tablet (5 mg total) by mouth 2 (two) times daily. 60 tablet 2  . HYDROcodone-acetaminophen (NORCO/VICODIN) 5-325 MG tablet Take 0.5-1 tablets by mouth 2 (two) times daily as needed for moderate pain. 60 tablet 0  . Insulin Syringe-Needle U-100 (B-D INS SYR ULTRAFINE 1CC/30G) 30G X 1/2" 1 ML MISC 1 Syringe by Does not apply route as directed. 100 each 3  . Insulin Syringe-Needle U-100 (B-D INS SYRINGE 0.5CC/30GX1/2") 30G X 1/2" 0.5 ML MISC Use as directed to inject insulin. Dx: E11.65 100 each 3  . LANTUS 100 UNIT/ML injection Inject 0.35 mLs (35 Units total) into the skin at bedtime. 10 mL 2  . lisinopril (PRINIVIL,ZESTRIL) 40 MG tablet Take 1 tablet (40 mg total) by mouth daily. 90 tablet 1  . metFORMIN (GLUCOPHAGE) 1000 MG tablet Take 1 tablet (1,000 mg total) by mouth 2 (two) times daily with a meal. 180 tablet 1  . metoprolol tartrate (LOPRESSOR) 50 MG tablet Take one-half (25mg ) tablet twice a day 60 tablet 3  . pantoprazole (PROTONIX) 20 MG tablet Take 1 tablet (20 mg total) by mouth daily. 30 tablet 6  . potassium chloride SA (K-DUR,KLOR-CON) 20 MEQ  tablet Take 1 tablet (20 mEq total) by mouth daily. Hold potassium on days you need spironolactone    . saw palmetto 160 MG capsule Take 450 mg by mouth daily.     . tamsulosin (FLOMAX) 0.4 MG CAPS capsule Take 1 capsule (0.4 mg total) by mouth daily after supper. 30 capsule 6  . torsemide (DEMADEX) 20 MG tablet Take 2 tablets (40 mg total) by mouth 2 (two) times daily. 120 tablet 3  . traZODone (DESYREL) 50 MG tablet Take 2 tablets (100 mg total) by mouth at bedtime. 180 tablet 0  . vitamin C (ASCORBIC ACID) 500 MG tablet Take 500 mg by mouth daily.     No current facility-administered medications for this visit.     Allergies:   Gabapentin; Spironolactone; Amlodipine; Other; Rosiglitazone maleate; and Tricor [fenofibrate]    Social History:  The patient  reports that he quit smoking about 4 years ago. His smoking use included cigarettes. He has a 18.00 pack-year smoking history. he has never used smokeless tobacco. He reports that he does not drink alcohol or use drugs.   Family History:  The patient's family history includes Cancer in his brother, brother, maternal grandmother, and mother; Diabetes in his sister; Heart attack in his maternal grandfather, paternal grandfather, and paternal grandmother; Heart disease in his father and sister; Hypertension in his father and sister; Lung disease in his brother; Stroke in his sister.    ROS:  Please see the history of present illness. All other systems are reviewed and negative.    PHYSICAL EXAM: VS:  BP (!) 177/84   Pulse (!) 44   Ht 6\' 1"  (1.854 m)   Wt 218 lb 3.2 oz (99 kg)   SpO2 95%   BMI 28.79 kg/m  , BMI Body mass index is 28.79 kg/m. GEN: Well nourished, well developed, male in moderate distress  HEENT: normal for age  Neck: JVD 12 cm, no carotid bruit (mumur radiates to carotids), no masses Cardiac: slightly irregular R&R; 2-3/6 murmur, no rubs, or gallops Respiratory: decreased BS bases bilaterally, w/  basilar rales GI: soft, nontender, nondistended, + BS MS: no deformity or atrophy; 1+ edema; distal pulses are 2+ in all 4  extremities   Skin: warm and dry, no rash Neuro:  Strength and sensation are intact Psych: euthymic mood, full affect   EKG:  EKG is ordered today. The ekg ordered today demonstrates 2nd deg HB, Mobitz 1, HR 46  ECHO: 08/08/2017 Left ventricle: The cavity size was normal. There was severe concentric hypertrophy. Systolic function was normal. The estimated ejection fraction was in the range of 55% to 60%. Wall motion was normal; there were no regional wall motion abnormalities. Features are consistent with a pseudonormal left ventricular filling pattern, with concomitant abnormal relaxation and increased filling pressure (grade 2 diastolic dysfunction). Doppler parameters are consistent with high ventricular filling pressure. - Aortic valve: Right coronary cusp immobility was noted. There was moderate to severe stenosis. There was trivial regurgitation. VTI ratio of LVOT to aortic valve: 0.25. Valve area (VTI): 0.8 cm^2. Indexed valve area (VTI): 0.36 cm^2/m^2. Peak velocity ratio of LVOT to aortic valve: 0.22. Valve area (Vmax): 0.7 cm^2. Indexed valve area (Vmax): 0.32 cm^2/m^2. Mean velocity ratio of LVOT to aortic valve: 0.24. Valve area (Vmean): 0.75 cm^2. Indexed valve area (Vmean): 0.34 cm^2/m^2. Mean gradient (S): 32 mm Hg. Peak gradient (S): 58 mm Hg.   - Aorta: Ascending aortic diameter: 41 mm (S). - Ascending aorta: The ascending aorta was mildly dilated. - Mitral valve: Severely calcified annulus. Diffuse calcification of the anterior leaflet. There was mild regurgitation. - Left atrium: The atrium was mildly dilated. - Tricuspid valve: There was trivial regurgitation. - Pulmonary arteries: PA peak pressure: 39 mm Hg (S). Impressions: - The right ventricular systolic pressure was increased consistent with mild  pulmonary hypertension.   Recent Labs: 10/28/2016: TSH 3.51 01/28/2017: Hemoglobin 13.9; Platelets 180.0; Pro B Natriuretic peptide (BNP) 537.0 09/02/2017: BUN 20; Creatinine, Ser 1.05; Potassium 4.4; Sodium 146    Lipid Panel Labs(Brief)          Component Value Date/Time   CHOL 131 06/08/2016 1423   TRIG 95.0 06/08/2016 1423   TRIG 133 09/10/2011   HDL 39.50 06/08/2016 1423   CHOLHDL 3 06/08/2016 1423   VLDL 19.0 06/08/2016 1423   LDLCALC 73 06/08/2016 1423   LDLDIRECT 77 09/10/2011          Wt Readings from Last 3 Encounters:  09/06/17 218 lb 3.2 oz (99 kg)  09/02/17 214 lb (97.1 kg)  08/24/17 213 lb (96.6 kg)     Other studies Reviewed: Additional studies/ records that were reviewed today include: Office notes, hospital records and testing.  ASSESSMENT AND PLAN: The patient was seen by Dr. [name] who agrees with the plan  1.  Acute on chronic diastolic CHF: His weight is up from his previous visit despite changing him from Lasix to Demadex.  He has significant problems with sodium indiscretion.  His sons help with his care, need to make them aware that he needs low-sodium foods.    He has grade 2 diastolic dysfunction as well as severe aortic stenosis.  These are both contributing to his CHF.  A confounding factor is the bradycardia.  Because of his weakness and significant shortness of breath, I do not believe he can be safely treated as an outpatient.  Admission for IV diuretics and further management is indicated.  His oxygen saturation is good at rest, I believe that he can be safely transported by car.   Start Lasix 80 mg IV BID. Follow I/O, daily wts and BMET.  2.  Moderate/severe aortic stenosis: Peak gradient in 2016 was 49, now is 58.  Mean gradient was 28, now 32.  Valve area 0.8 cm.  My original goal was to get him to a baseline respiratory status and then discuss referral to the TAVR clinic with Dr. [name].  It may be a good idea to do this  while he is in the hospital.  3.  Symptomatic bradycardia: He has been on metoprolol 25 mg twice daily for a long time.  At his office visit 11/30, his heart rate was 78.  There was some low heart rates are at the beginning of the year, but he was asymptomatic at the time unless he was in atrial flutter.  The underlying rhythm is sinus today with Mobitz 1 second-degree AV block  4.  Chronic anticoagulation: He is having no symptoms of bleeding on the Eliquis.   Current medicines are reviewed at length with the patient today.  The patient does not have concerns regarding medicines.  The following changes have been made: Discontinue beta-blocker, admit for IV diuresis  Labs/ tests ordered today include: n/a   Disposition:   FU with Dr [name]  Signed, Rosaria Ferries, PA-C  09/06/2017 9:06 AM    [place] Phone: [phone]; Fax: [phone]  This note was written with the assistance of speech recognition software. Please excuse any transcriptional errors.

## 2017-09-06 NOTE — ED Notes (Signed)
Pt cleaned wihen condomcath came off.

## 2017-09-06 NOTE — ED Notes (Signed)
Pt care assumed, pt is A&O x 4.  Family at bedside.  Pt denies any pain?SOB/dizziness.  Medicated as ordered.

## 2017-09-06 NOTE — Progress Notes (Signed)
New pt admission from ED. Pt brought to the floor in stable condition. Vitals taken. Initial Assessment done. All immediate pertinent needs to patient addressed. Patient Guide given to patient. Important safety instructions relating to hospitalization reviewed with patient. Patient verbalized understanding. Will continue to monitor pt. 

## 2017-09-07 DIAGNOSIS — I5033 Acute on chronic diastolic (congestive) heart failure: Secondary | ICD-10-CM

## 2017-09-07 LAB — HEPATIC FUNCTION PANEL
ALBUMIN: 3.5 g/dL (ref 3.5–5.0)
ALK PHOS: 74 U/L (ref 38–126)
ALT: 16 U/L — AB (ref 17–63)
AST: 17 U/L (ref 15–41)
Bilirubin, Direct: 0.1 mg/dL (ref 0.1–0.5)
Indirect Bilirubin: 0.6 mg/dL (ref 0.3–0.9)
TOTAL PROTEIN: 6.4 g/dL — AB (ref 6.5–8.1)
Total Bilirubin: 0.7 mg/dL (ref 0.3–1.2)

## 2017-09-07 LAB — GLUCOSE, CAPILLARY
GLUCOSE-CAPILLARY: 190 mg/dL — AB (ref 65–99)
GLUCOSE-CAPILLARY: 166 mg/dL — AB (ref 65–99)
GLUCOSE-CAPILLARY: 198 mg/dL — AB (ref 65–99)
Glucose-Capillary: 393 mg/dL — ABNORMAL HIGH (ref 65–99)

## 2017-09-07 LAB — BASIC METABOLIC PANEL
Anion gap: 12 (ref 5–15)
BUN: 19 mg/dL (ref 6–20)
CHLORIDE: 95 mmol/L — AB (ref 101–111)
CO2: 32 mmol/L (ref 22–32)
Calcium: 9 mg/dL (ref 8.9–10.3)
Creatinine, Ser: 1 mg/dL (ref 0.61–1.24)
GFR calc Af Amer: >60 mL/min (ref >60)
GFR calc non Af Amer: >60 mL/min (ref >60)
Glucose, Bld: 235 mg/dL — ABNORMAL HIGH (ref 65–99)
POTASSIUM: 3.4 mmol/L — AB (ref 3.5–5.1)
Sodium: 139 mmol/L (ref 135–145)

## 2017-09-07 MED ORDER — INSULIN ASPART 100 UNIT/ML ~~LOC~~ SOLN
0.0000 [IU] | Freq: Three times a day (TID) | SUBCUTANEOUS | Status: DC
  Administered 2017-09-07: 3 [IU] via SUBCUTANEOUS
  Administered 2017-09-07: 15 [IU] via SUBCUTANEOUS
  Administered 2017-09-08: 1 [IU] via SUBCUTANEOUS
  Administered 2017-09-08: 8 [IU] via SUBCUTANEOUS
  Administered 2017-09-08: 3 [IU] via SUBCUTANEOUS
  Administered 2017-09-09: 11 [IU] via SUBCUTANEOUS

## 2017-09-07 MED ORDER — HYDRALAZINE HCL 20 MG/ML IJ SOLN: 5.0000 mg | Freq: Three times a day (TID) | INTRAMUSCULAR | Status: DC | PRN

## 2017-09-07 NOTE — Progress Notes (Addendum)
Pt's Heart rate dropped to 40 but it is non sustained and take a peak less than a seconds, informed to MD (night coverage cardiology team), pt is stable, vitals stable, pt is sleeping this time but easily arousal and said he is doing fine, no any complain of CP and SOB, will continue to monitor

## 2017-09-07 NOTE — Progress Notes (Addendum)
Pt's heart rate dropped to 38 and it is nonsustained, went back to 48, pt's CV strips showing second degree AVB, informed cardiologyt to review his CV strips. He said continue monitor. Patient is stable, denies chest pain and SOB, resting comfortably in bed, will continue to monitor the patient  [place]

## 2017-09-07 NOTE — Progress Notes (Signed)
Progress Note  Patient Name: Charles [name] Date of Encounter: 09/07/2017  Primary Cardiologist: Dr. [name]   Subjective   Feels "ok". RN notes that he still appears short of breath with short transfers. Denies CP. Has condom cath in place.   Inpatient Medications    Scheduled Meds: . apixaban  5 mg Oral BID  . atorvastatin  20 mg Oral Daily  . citalopram  20 mg Oral Daily  . vitamin B-12  500 mcg Oral Daily  . furosemide  80 mg Intravenous BID  . insulin glargine  35 Units Subcutaneous QHS  . lisinopril  40 mg Oral Daily  . pantoprazole  20 mg Oral Daily  . potassium chloride SA  20 mEq Oral BID  . sodium chloride flush  3 mL Intravenous Q12H  . tamsulosin  0.4 mg Oral QPC supper  . traZODone  100 mg Oral QHS  . vitamin C  500 mg Oral Daily   Continuous Infusions: . sodium chloride     PRN Meds: sodium chloride, acetaminophen, ALPRAZolam, fluticasone, nitroGLYCERIN, ondansetron (ZOFRAN) IV, sodium chloride flush, zolpidem   Vital Signs    Vitals:   09/07/17 0012 09/07/17 0200 09/07/17 0406 09/07/17 0542  BP: (!) 144/65 (!) 148/68 140/60 (!) 159/73  Pulse: (!) 57 (!) 58 (!) 48 63  Resp: 20 20  20   Temp: 98.4 F (36.9 C)   98.5 F (36.9 C)  TempSrc: Oral   Oral  SpO2: 96% 98% 98% 95%  Weight:    210 lb (95.3 kg)  Height:        Intake/Output Summary (Last 24 hours) at 09/07/2017 0930 Last data filed at 09/07/2017 0500 Gross per 24 hour  Intake 300 ml  Output 2400 ml  Net -2100 ml   Filed Weights   09/06/17 2212 09/07/17 0542  Weight: 210 lb 14.4 oz (95.7 kg) 210 lb (95.3 kg)    Telemetry    Irregular, bradycardia - Personally Reviewed  ECG    Sinus bradycardia with 2nd degree A-V block (Mobitz I) - Personally Reviewed  Physical Exam   GEN: No acute distress.   Neck: No JVD Cardiac: irregular, 2/6 murmur loudest at RUSB  Respiratory: Clear to auscultation bilaterally. GI: Soft, nontender, non-distended  MS: trace bilateral pedal edema; No  deformity. Neuro:  Nonfocal  Psych: Normal affect   Labs    Chemistry Recent Labs  Lab 09/02/17 0917 09/06/17 1026 09/07/17 0430  NA 146* 140 139  K 4.4 3.9 3.4*  CL 101 98* 95*  CO2 28 31 32  GLUCOSE 111* 248* 235*  BUN 20 20 19   CREATININE 1.05 1.07 1.00  CALCIUM 9.8 9.7 9.0  PROT  --   --  6.4*  ALBUMIN  --   --  3.5  AST  --   --  17  ALT  --   --  16*  ALKPHOS  --   --  74  BILITOT  --   --  0.7  GFRNONAA 65 >60 >60  GFRAA 76 >60 >60  ANIONGAP  --  11 12     Hematology Recent Labs  Lab 09/06/17 1026  WBC 6.9  RBC 4.23  HGB 12.8*  HCT 39.4  MCV 93.1  MCH 30.3  MCHC 32.5  RDW 13.3  PLT 172    Cardiac EnzymesNo results for input(s): TROPONINI in the last 168 hours.  Recent Labs  Lab 09/06/17 1058  TROPIPOC 0.06     BNP Recent Labs  Lab 09/06/17 1028  BNP 587.9*     DDimer No results for input(s): DDIMER in the last 168 hours.   Radiology    Dg Chest 2 View  Result Date: 09/06/2017 CLINICAL DATA:  Cough and short of breath.  Diabetic hypertension EXAM: CHEST  2 VIEW COMPARISON:  11/01/2016 FINDINGS: CABG.  Negative for heart failure. Mild bibasilar atelectasis.  No definite pneumonia or effusion. IMPRESSION: No active cardiopulmonary disease. Electronically Signed   By: Franchot Gallo M.D.   On: 09/06/2017 11:03    Cardiac Studies   2D Echo 08/08/17 Study Conclusions  - Left ventricle: The cavity size was normal. There was severe   concentric hypertrophy. Systolic function was normal. The   estimated ejection fraction was in the range of 55% to 60%. Wall   motion was normal; there were no regional wall motion   abnormalities. Features are consistent with a pseudonormal left   ventricular filling pattern, with concomitant abnormal relaxation   and increased filling pressure (grade 2 diastolic dysfunction).   Doppler parameters are consistent with high ventricular filling   pressure. - Aortic valve: Right coronary cusp immobility was  noted. There was   moderate to severe stenosis. There was trivial regurgitation.   Mean gradient (S): 32 mm Hg. - Aorta: Ascending aortic diameter: 41 mm (S). - Ascending aorta: The ascending aorta was mildly dilated. - Mitral valve: Severely calcified annulus. Diffuse calcification   of the anterior leaflet. There was mild regurgitation. - Left atrium: The atrium was mildly dilated. - Tricuspid valve: There was trivial regurgitation. - Pulmonary arteries: PA peak pressure: 39 mm Hg (S).  Impressions:  - The right ventricular systolic pressure was increased consistent   with mild pulmonary hypertension.     Patient Profile     Charles [name] is a 83 y.o. male with a history of CABG 2004 w/ LIMA-LAD, SVG-RCA, SVG-OM, SVG-RI, RAS w/ bilat 70% dz, s/p L-CEA, HTN, HLD, NIDDM, MV 2014 no ischemia, A flutter dx 2016 s/p TEE/DCCV 11/02/2016, on Eliquis w/ CHADS2VASC=5 (age x 2, HTN, CAD, DM), mod-severe AS by echo 2016 (peak gradient 49).  Pt directly admitted from the office 09/06/17 for symptomatic acute on chronic diastolic CHF.   Assessment & Plan    1. Acute on Chronic Diastolic CHF: in the setting of dietary indiscretion with sodium. He has failed increasing doses of home diuretics. Lasix was discontinued and  Demadex added. No improvement in symptoms. He has had subsequent weight gain, exertional dyspnea and fatigue. Admit BNP elevated at 587. CXR showed no active cardiopulmonary disease. Started on IV Lasix, 80 mg BID, with good UOP. -2.4L out since admit yesterday. Renal function is stable. SCr at 1.00. Still with evidence of volume overload. Continue IV diuresis, but will need to correct hypokalemia (3.4). Will give supplemental K today and check f/u BMP tomorrow. Continue strict I/Os and tract weight daily to help guide further diuresis. Low sodium diet.   2. Aortic Stenosis: Echo 08/2017 showed normal LVEF at 55-60%, G2DD, moderate to severe AS with mean gradient of 32 mm Hg.  Valve area 0.7. Continue to monitor by echo. Will need referral to valve clinic as mean gradient approaches closer to 40 mm Hg.   3. Symptomatic Bradycardia: Sinus bradycardia with 2nd degree A-V block (Mobitz I) noted on office EKG yesterday. HR in the 50s. Irregular on tele. Avoiding AV nodal blocking agents.   4. Hypokalemia: K 3.4 today, in the setting of IV diuretic therapy w/ Lasix  80 mg BID. Supplemental K ordered for today 20 mEq BID. Monitor closely.   5. DM: blood glucose levels have been elevated. SSI ordered.   6. H/o Atrial Flutter: HR is controlled. He is on Eliquis for a/c.   For questions or updates, please contact [place] Please consult [email or web address] for contact info under Cardiology/STEMI.      Signed, Lyda Jester, PA-C  09/07/2017, 9:30 AM    Seen and examined agree with above  83 year old male with acute on chronic diastolic heart failure.  Moderate to severe aortic stenosis.  Symptom medic bradycardia noted.  He is holding his metoprolol.  Exam reveals an alert male, elderly, lungs with mild crackles at bases, irregularly bradycardic rhythm.  Lab work personally reviewed, creatinine 1.    -Continue with aggressive IV diuresis, Lasix 80 mg twice a day.  Replete potassium.  Overall reasonable output yesterday.  Continue.    -Atrial flutter under good control, Eliquis  -Hypertension has been an issue for him for quite some time, has an intolerance to amlodipine, Spironolactone caused gynecomastia.  Could consider eplerenone if he is able to afford.  For now, blood pressure should decrease as we continue to diurese him.   [name], MD

## 2017-09-08 ENCOUNTER — Other Ambulatory Visit: Payer: Self-pay | Admitting: Family Medicine

## 2017-09-08 LAB — BASIC METABOLIC PANEL WITH GFR
Anion gap: 9 (ref 5–15)
BUN: 15 mg/dL (ref 6–20)
CO2: 32 mmol/L (ref 22–32)
Calcium: 9.2 mg/dL (ref 8.9–10.3)
Chloride: 97 mmol/L — ABNORMAL LOW (ref 101–111)
Creatinine, Ser: 0.92 mg/dL (ref 0.61–1.24)
GFR calc Af Amer: >60 mL/min
GFR calc non Af Amer: >60 mL/min
Glucose, Bld: 121 mg/dL — ABNORMAL HIGH (ref 65–99)
Potassium: 3.6 mmol/L (ref 3.5–5.1)
Sodium: 138 mmol/L (ref 135–145)

## 2017-09-08 LAB — GLUCOSE, CAPILLARY
Glucose-Capillary: 121 mg/dL — ABNORMAL HIGH (ref 65–99)
Glucose-Capillary: 285 mg/dL — ABNORMAL HIGH (ref 65–99)
Glucose-Capillary: 185 mg/dL — ABNORMAL HIGH (ref 65–99)

## 2017-09-08 NOTE — Discharge Instructions (Signed)

## 2017-09-08 NOTE — Progress Notes (Signed)
Progress Note  Patient Name: Charles [name] Date of Encounter: 09/08/2017  Primary Cardiologist: Dr. [name]   Subjective   Feels much better, still gets SOB w/ minimal activity. Has not ambulated much. Lives alone, sons help in his care.   Inpatient Medications    Scheduled Meds: . apixaban  5 mg Oral BID  . atorvastatin  20 mg Oral Daily  . citalopram  20 mg Oral Daily  . vitamin B-12  500 mcg Oral Daily  . furosemide  80 mg Intravenous BID  . insulin aspart  0-15 Units Subcutaneous TID WC  . insulin glargine  35 Units Subcutaneous QHS  . lisinopril  40 mg Oral Daily  . pantoprazole  20 mg Oral Daily  . potassium chloride SA  20 mEq Oral BID  . sodium chloride flush  3 mL Intravenous Q12H  . tamsulosin  0.4 mg Oral QPC supper  . traZODone  100 mg Oral QHS  . vitamin C  500 mg Oral Daily   Continuous Infusions: . sodium chloride     PRN Meds: sodium chloride, acetaminophen, ALPRAZolam, fluticasone, hydrALAZINE, nitroGLYCERIN, ondansetron (ZOFRAN) IV, sodium chloride flush, zolpidem   Vital Signs    Vitals:   09/08/17 0025 09/08/17 0226 09/08/17 0621 09/08/17 0925  BP: (!) 155/72  (!) 148/71 (!) 159/65  Pulse: 72  72 83  Resp: 18  19   Temp: 98.3 F (36.8 C)  98.4 F (36.9 C)   TempSrc: Oral  Oral   SpO2: 100%  94%   Weight:  207 lb (93.9 kg)    Height:        Intake/Output Summary (Last 24 hours) at 09/08/2017 1104 Last data filed at 09/08/2017 0900 Gross per 24 hour  Intake 780 ml  Output 3250 ml  Net -2470 ml   Filed Weights   09/06/17 2212 09/07/17 0542 09/08/17 0226  Weight: 210 lb 14.4 oz (95.7 kg) 210 lb (95.3 kg) 207 lb (93.9 kg)    Telemetry    SR, Sinus brady, Mobitz I w/ HR 30s overnight - Personally Reviewed  ECG    12/04 Sinus bradycardia with 2nd degree A-V block (Mobitz I) - Personally Reviewed  Physical Exam   GEN: WD, WN, WM, NAD.   Neck: No JVD seen Cardiac: Slightly irreg R&R, 2/6 SEM,   Respiratory: slightly decreased BS  bases. GI: +BS, soft, NT MS: No LE edema, pulses intact. Neuro:  no focal deficits Psych: nl affect  Labs    Chemistry Recent Labs  Lab 09/06/17 1026 09/07/17 0430 09/08/17 0621  NA 140 139 138  K 3.9 3.4* 3.6  CL 98* 95* 97*  CO2 31 32 32  GLUCOSE 248* 235* 121*  BUN 20 19 15   CREATININE 1.07 1.00 0.92  CALCIUM 9.7 9.0 9.2  PROT  --  6.4*  --   ALBUMIN  --  3.5  --   AST  --  17  --   ALT  --  16*  --   ALKPHOS  --  74  --   BILITOT  --  0.7  --   GFRNONAA >60 >60 >60  GFRAA >60 >60 >60  ANIONGAP 11 12 9      Hematology Recent Labs  Lab 09/06/17 1026  WBC 6.9  RBC 4.23  HGB 12.8*  HCT 39.4  MCV 93.1  MCH 30.3  MCHC 32.5  RDW 13.3  PLT 172    Cardiac Enzymes  Recent Labs  Lab 09/06/17 1058  TROPIPOC 0.06     BNP Recent Labs  Lab 09/06/17 1028  BNP 587.9*     Radiology    No results found.  Cardiac Studies   2D Echo 08/08/17 Study Conclusions  - Left ventricle: The cavity size was normal. There was severe   concentric hypertrophy. Systolic function was normal. The   estimated ejection fraction was in the range of 55% to 60%. Wall   motion was normal; there were no regional wall motion   abnormalities. Features are consistent with a pseudonormal left   ventricular filling pattern, with concomitant abnormal relaxation   and increased filling pressure (grade 2 diastolic dysfunction).   Doppler parameters are consistent with high ventricular filling   pressure. - Aortic valve: Right coronary cusp immobility was noted. There was   moderate to severe stenosis. There was trivial regurgitation.   Mean gradient (S): 32 mm Hg. Peak gradient (S): 58 mm Hg. - Aorta: Ascending aortic diameter: 41 mm (S). - Ascending aorta: The ascending aorta was mildly dilated. - Mitral valve: Severely calcified annulus. Diffuse calcification   of the anterior leaflet. There was mild regurgitation. - Left atrium: The atrium was mildly dilated. - Tricuspid  valve: There was trivial regurgitation. - Pulmonary arteries: PA peak pressure: 39 mm Hg (S). Impressions: - The right ventricular systolic pressure was increased consistent   with mild pulmonary hypertension.   Patient Profile     Charles [name] is a 83 y.o. male with a history of CABG 2004 w/ LIMA-LAD, SVG-RCA, SVG-OM, SVG-RI, RAS w/ bilat 70% dz, s/p L-CEA, HTN, HLD, NIDDM, MV 2014 no ischemia, A flutter dx 2016 s/p TEE/DCCV 11/02/2016, Eliquis w/ CHADS2VASC=5 (age x 2, HTN, CAD, DM), mod-severe AS by echo 2016 (peak gradient 49), echo 08/2017 w/ peak gradient now 58  Pt directly admitted from the office 09/06/17 for symptomatic acute on chronic diastolic CHF.   Assessment & Plan    1. Acute on Chronic Diastolic CHF:  - +Na indiscretion - failed outpt diuresis - wt down 3 lbs and edema has resolved - I/O net neg 5.2 L - continue IV Lasix 80 mg bid, possible change to po in am - renal function has remained stable w/ diuresis - K+ ok today  - need to ambulate and see how tolerated  2. Aortic Stenosis:  - recent echo results above - mean gradient 32, peak 58 - at some point, referral to TAVR clinic  3. Symptomatic Bradycardia:  - need to see if HR increases w/ ambulation, mostly Mobitz I, but HR ok during the day - HR dropping to 30s overnight, but still only Mobitz I - no rate-lowering rx  4. Hypokalemia: -on supplement, follow  5. DM:  - on home rx + SSI  6. H/o Atrial Flutter:  - not in flutter now - continue Eliquis   For questions or updates, please contact [place] Please consult [email or web address] for contact info under Cardiology/STEMI.      Signed, Rosaria Ferries, PA-C  09/08/2017, 11:04 AM    Personally seen and examined. Agree with above. 83 year old male with acute on chronic diastolic heart failure, symptomatic bradycardia with heart rate of 30 overnight, Wenke Bach phenomenon with paroxysmal atrial flutter on Eliquis here with shortness of  breath.  No significant pedal edema or pretibial edema noted.  Foley bag in place, Foley catheter.  Alert.  Elderly with 3/6 systolic murmur in aortic position.  -Continue with IV diuresis today.  Hopeful discharge tomorrow.  Still feeling  some shortness of breath with activity.  It would also be beneficial to ambulate him in the hallway to see how his telemetry responds to ensure chronotropic competence.  Letting metoprolol washout.  His aortic stenosis is not severe as of yet.  [name], MD

## 2017-09-09 DIAGNOSIS — R001 Bradycardia, unspecified: Secondary | ICD-10-CM

## 2017-09-09 LAB — BASIC METABOLIC PANEL
Anion gap: 9 (ref 5–15)
BUN: 18 mg/dL (ref 6–20)
CALCIUM: 9.4 mg/dL (ref 8.9–10.3)
CO2: 30 mmol/L (ref 22–32)
CREATININE: 1.06 mg/dL (ref 0.61–1.24)
Chloride: 98 mmol/L — ABNORMAL LOW (ref 101–111)
GFR calc Af Amer: >60 mL/min (ref >60)
Glucose, Bld: 109 mg/dL — ABNORMAL HIGH (ref 65–99)
POTASSIUM: 3.6 mmol/L (ref 3.5–5.1)
SODIUM: 137 mmol/L (ref 135–145)

## 2017-09-09 LAB — GLUCOSE, CAPILLARY
GLUCOSE-CAPILLARY: 93 mg/dL (ref 65–99)
Glucose-Capillary: 306 mg/dL — ABNORMAL HIGH (ref 65–99)

## 2017-09-09 MED ORDER — POTASSIUM CHLORIDE CRYS ER 20 MEQ PO TBCR: 20.0000 meq | EXTENDED_RELEASE_TABLET | Freq: Two times a day (BID) | ORAL | 11 refills | Status: DC

## 2017-09-09 NOTE — Consult Note (Signed)
   [facility] Inpatient Consult   09/09/2017  Charles [name] December 06, 1931 [id number]  Patient screened for potential [facility] services showing as high risk in the [place].  [name] Met withtthe patient at the bedside regarding [facility] benefits in the network.  His primary care provider is [name].  His sons and daughters are actively involved in his care he states and he feels he has good medication management with [employer] and his daughter in law picks them up.  He uses [facility].  He lives in [place]. He denies any needs at this point he did accept the brochure, 24 hour nurse advise line magnet and contact information.    Please place a [facility] consult or for questions contact:   Natividad Brood, RN BSN [facility] Liaison  [phone] business mobile phone Toll free office [phone]

## 2017-09-09 NOTE — Progress Notes (Signed)
Call from central tele stating patient had14 beats of Vtach. Will continue to monitor.

## 2017-09-09 NOTE — Progress Notes (Signed)
Progress Note  Patient Name: Charles [name] Date of Encounter: 09/09/2017  Primary Cardiologist: Dr. [name]   Subjective   Improved since admission.  He feels ready to go home to his apartment.  He states that he feels safe there.  No chest pain.  Improved dyspnea.  Inpatient Medications    Scheduled Meds: . apixaban  5 mg Oral BID  . atorvastatin  20 mg Oral Daily  . citalopram  20 mg Oral Daily  . vitamin B-12  500 mcg Oral Daily  . furosemide  80 mg Intravenous BID  . insulin aspart  0-15 Units Subcutaneous TID WC  . insulin glargine  35 Units Subcutaneous QHS  . lisinopril  40 mg Oral Daily  . pantoprazole  20 mg Oral Daily  . potassium chloride SA  20 mEq Oral BID  . sodium chloride flush  3 mL Intravenous Q12H  . tamsulosin  0.4 mg Oral QPC supper  . traZODone  100 mg Oral QHS  . vitamin C  500 mg Oral Daily   Continuous Infusions: . sodium chloride     PRN Meds: sodium chloride, acetaminophen, ALPRAZolam, fluticasone, hydrALAZINE, nitroGLYCERIN, ondansetron (ZOFRAN) IV, sodium chloride flush, zolpidem   Vital Signs    Vitals:   09/08/17 0621 09/08/17 0925 09/08/17 2011 09/09/17 0422  BP: (!) 148/71 (!) 159/65 (!) 149/70 130/70  Pulse: 72 83 77 67  Resp: 19  18 20   Temp: 98.4 F (36.9 C)  98.6 F (37 C) 98.5 F (36.9 C)  TempSrc: Oral  Oral Oral  SpO2: 94%  94% 97%  Weight:    205 lb (93 kg)  Height:        Intake/Output Summary (Last 24 hours) at 09/09/2017 0746 Last data filed at 09/09/2017 0428 Gross per 24 hour  Intake 680 ml  Output 4000 ml  Net -3320 ml   Filed Weights   09/07/17 0542 09/08/17 0226 09/09/17 0422  Weight: 210 lb (95.3 kg) 207 lb (93.9 kg) 205 lb (93 kg)    Telemetry    SR, Sinus brady, Mobitz I, brief 12 beat run of wide-complex tachycardia- Personally Reviewed  ECG    12/04 Sinus bradycardia with 2nd degree A-V block (Mobitz I) - Personally Reviewed  Physical Exam   GEN: Well nourished, well developed, in no acute  distress, slightly disheveled, elderly HEENT: normal  Neck: no JVD, carotid bruits, or masses Cardiac: RRR; 3/6 systolic murmur, no rubs, or gallops,no further edema  Respiratory:  clear to auscultation bilaterally, normal work of breathing, no active wheezing.  Baseline decreased air movement bilaterally GI: soft, nontender, nondistended, + BS MS: no deformity or atrophy  Skin: warm and dry, no rash Neuro:  Alert and Oriented x 3, Strength and sensation are intact Psych: euthymic mood, full affect  GEN: WD, WN, WM, NAD.   Neck: No JVD seen Cardiac: Slightly irreg R&R, 2/6 SEM,   Respiratory: slightly decreased BS bases. GI: +BS, soft, NT MS: No LE edema, pulses intact. Neuro:  no focal deficits Psych: nl affect  Labs    Chemistry Recent Labs  Lab 09/07/17 0430 09/08/17 0621 09/09/17 0414  NA 139 138 137  K 3.4* 3.6 3.6  CL 95* 97* 98*  CO2 32 32 30  GLUCOSE 235* 121* 109*  BUN 19 15 18   CREATININE 1.00 0.92 1.06  CALCIUM 9.0 9.2 9.4  PROT 6.4*  --   --   ALBUMIN 3.5  --   --   AST 17  --   --  ALT 16*  --   --   ALKPHOS 74  --   --   BILITOT 0.7  --   --   GFRNONAA >60 >60 >60  GFRAA >60 >60 >60  ANIONGAP 12 9 9      Hematology Recent Labs  Lab 09/06/17 1026  WBC 6.9  RBC 4.23  HGB 12.8*  HCT 39.4  MCV 93.1  MCH 30.3  MCHC 32.5  RDW 13.3  PLT 172    Cardiac Enzymes  Recent Labs  Lab 09/06/17 1058  TROPIPOC 0.06     BNP Recent Labs  Lab 09/06/17 1028  BNP 587.9*     Radiology    No results found.  Cardiac Studies   2D Echo 08/08/17 Study Conclusions  - Left ventricle: The cavity size was normal. There was severe   concentric hypertrophy. Systolic function was normal. The   estimated ejection fraction was in the range of 55% to 60%. Wall   motion was normal; there were no regional wall motion   abnormalities. Features are consistent with a pseudonormal left   ventricular filling pattern, with concomitant abnormal relaxation    and increased filling pressure (grade 2 diastolic dysfunction).   Doppler parameters are consistent with high ventricular filling   pressure. - Aortic valve: Right coronary cusp immobility was noted. There was   moderate to severe stenosis. There was trivial regurgitation.   Mean gradient (S): 32 mm Hg. Peak gradient (S): 58 mm Hg. - Aorta: Ascending aortic diameter: 41 mm (S). - Ascending aorta: The ascending aorta was mildly dilated. - Mitral valve: Severely calcified annulus. Diffuse calcification   of the anterior leaflet. There was mild regurgitation. - Left atrium: The atrium was mildly dilated. - Tricuspid valve: There was trivial regurgitation. - Pulmonary arteries: PA peak pressure: 39 mm Hg (S). Impressions: - The right ventricular systolic pressure was increased consistent   with mild pulmonary hypertension.   Patient Profile     TYAIRE ODEM is a 83 y.o. male with a history of CABG 2004 w/ LIMA-LAD, SVG-RCA, SVG-OM, SVG-RI, RAS w/ bilat 70% dz, s/p L-CEA, HTN, HLD, NIDDM, MV 2014 no ischemia, A flutter dx 2016 s/p TEE/DCCV 11/02/2016, Eliquis w/ CHADS2VASC=5 (age x 2, HTN, CAD, DM), mod-severe AS by echo 2016 (peak gradient 49), echo 08/2017 w/ peak gradient now 58  Pt directly admitted from the office 09/06/17 for symptomatic acute on chronic diastolic CHF.   Assessment & Plan    1. Acute on Chronic Diastolic CHF:  - Once again counseled him on decreasing sodium in diet.  Weight is down here in the hospital, 205 from 210 pounds.  9 L diuresis.  He is feeling better.  Lungs are improved.  I am okay with discharge. - Continue with outpatient torsemide 40 mg twice a day with close follow-up, TOC within 1-2 weeks to help prevent rehospitalization.  2. Aortic Stenosis:  - recent echo results above - mean gradient 32, peak 58 - at some point, referral to TAVR clinic when necessary  3. Symptomatic Bradycardia:  -Heart rate has improved after discontinuation of  metoprolol 25 mg twice a day.  4. Hypokalemia: -on supplement, follow as outpatient  5. DM:  - on home rx + SSI  6. H/o Atrial Flutter:  - not in flutter now - continue Eliquis, continue to monitor for signs of bleeding.  Okay for discharge.  I think he would benefit from home health PT.  He does appear frail.  For questions or  updates, please contact [place] Please consult [email or web address] for contact info under Cardiology/STEMI.   [name], MD

## 2017-09-09 NOTE — Evaluation (Signed)
Physical Therapy Evaluation Patient Details Name: Charles [name] MRN: [id number] DOB: 1931-10-12 Today's Date: 09/09/2017   History of Present Illness  Pt adm with acute on chronic heart failure. PMH - CABG, CAD, HTN, DM, lumbar compression fx's  Clinical Impression  Pt mobilizing adequately to return home with family checking in on. Feel pt can benefit from HHPT to further assist pt with improving strength, balance, and mobility.      Follow Up Recommendations Home health PT;Supervision - Intermittent    Equipment Recommendations  None recommended by PT    Recommendations for Other Services       Precautions / Restrictions Precautions Precautions: Fall Restrictions Weight Bearing Restrictions: No      Mobility  Bed Mobility               General bed mobility comments: Pt sitting up on EOB  Transfers Overall transfer level: Modified independent Equipment used: Rolling walker (2 wheeled);4-wheeled walker             General transfer comment: Incr time and effort  Ambulation/Gait Ambulation/Gait assistance: Modified independent (Device/Increase time) Ambulation Distance (Feet): 150 Feet Assistive device: 4-wheeled walker Gait Pattern/deviations: Step-through pattern;Decreased stride length;Trunk flexed;Wide base of support Gait velocity: At times too fast   General Gait Details: Pt with chronic back pain with baseline trunk flexion with gait. Verbal cues to slow down at times.  Stairs            Wheelchair Mobility    Modified Rankin (Stroke Patients Only)       Balance Overall balance assessment: Needs assistance Sitting-balance support: No upper extremity supported;Feet supported Sitting balance-Leahy Scale: Good     Standing balance support: No upper extremity supported;During functional activity Standing balance-Leahy Scale: Fair                               Pertinent Vitals/Pain Pain Assessment: No/denies pain     Home Living Family/patient expects to be discharged to:: Private residence Living Arrangements: Alone Available Help at Discharge: Family;Available PRN/intermittently Type of Home: House Home Access: Level entry     Home Layout: One level Home Equipment: Walker - 4 wheels;Shower seat;Cane - single point Additional Comments: Son lives very close by and checks on pt as needed.    Prior Function Level of Independence: Independent with assistive device(s)         Comments: Uses walker at times.     Hand Dominance        Extremity/Trunk Assessment   Upper Extremity Assessment Upper Extremity Assessment: Overall WFL for tasks assessed    Lower Extremity Assessment Lower Extremity Assessment: Generalized weakness    Cervical / Trunk Assessment Cervical / Trunk Assessment: Kyphotic  Communication   Communication: HOH  Cognition Arousal/Alertness: Awake/alert Behavior During Therapy: WFL for tasks assessed/performed Overall Cognitive Status: Within Functional Limits for tasks assessed                                        General Comments      Exercises     Assessment/Plan    PT Assessment All further PT needs can be met in the next venue of care  PT Problem List Decreased strength;Decreased activity tolerance;Decreased balance;Decreased mobility;Decreased knowledge of use of DME       PT Treatment Interventions  PT Goals (Current goals can be found in the Care Plan section)  Acute Rehab PT Goals PT Goal Formulation: All assessment and education complete, DC therapy    Frequency     Barriers to discharge        Co-evaluation               AM-PAC PT "6 Clicks" Daily Activity  Outcome Measure Difficulty turning over in bed (including adjusting bedclothes, sheets and blankets)?: A Little Difficulty moving from lying on back to sitting on the side of the bed? : A Little Difficulty sitting down on and standing up from a  chair with arms (e.g., wheelchair, bedside commode, etc,.)?: A Little Help needed moving to and from a bed to chair (including a wheelchair)?: None Help needed walking in hospital room?: None Help needed climbing 3-5 steps with a railing? : A Little 6 Click Score: 20    End of Session   Activity Tolerance: Patient tolerated treatment well Patient left: in bed;with call bell/phone within reach;with family/visitor present Nurse Communication: Mobility status PT Visit Diagnosis: Unsteadiness on feet (R26.81);Muscle weakness (generalized) (M62.81)    Time: 5525-8948 PT Time Calculation (min) (ACUTE ONLY): 12 min   Charges:   PT Evaluation $PT Eval Low Complexity: 1 Low     PT G CodesMarland Kitchen        [facility] PT [place] 09/09/2017, 12:30 PM

## 2017-09-09 NOTE — Discharge Summary (Signed)
Discharge Summary    Patient ID: Charles [name],  MRN: [id number], DOB/AGE: 11-Apr-1932 83 y.o.  Admit date: 09/06/2017 Discharge date: 09/09/2017  Primary Care Provider: [name]   Primary Cardiologist: Dr. [name]   Discharge Diagnoses    Principal Problem:   Acute on chronic diastolic CHF (congestive heart failure) ([place]) Active Problems:   Diabetes mellitus type 2, uncontrolled, with complications ([place])   Aortic stenosis   Bradycardia  Allergies Allergies  Allergen Reactions  . Gabapentin Other (See Comments)    Leg pain, weakness   . Spironolactone Other (See Comments)    Painful gynecomastia  . Amlodipine Other (See Comments)    Edema  . Other Other (See Comments)    myalgias Horse serum   . Rosiglitazone Maleate Other (See Comments)    REACTION: Did not help  . Tricor [Fenofibrate] Other (See Comments)    myalgias    Diagnostic Studies/Procedures    2D Echo 08/08/17 Study Conclusions  - Left ventricle: The cavity size was normal. There was severe concentric hypertrophy. Systolic function was normal. The estimated ejection fraction was in the range of 55% to 60%. Wall motion was normal; there were no regional wall motion abnormalities. Features are consistent with a pseudonormal left ventricular filling pattern, with concomitant abnormal relaxation and increased filling pressure (grade 2 diastolic dysfunction). Doppler parameters are consistent with high ventricular filling pressure. - Aortic valve: Right coronary cusp immobility was noted. There was moderate to severe stenosis. There was trivial regurgitation. Mean gradient (S): 32 mm Hg. Peak gradient (S): 58 mm Hg. - Aorta: Ascending aortic diameter: 41 mm (S). - Ascending aorta: The ascending aorta was mildly dilated. - Mitral valve: Severely calcified annulus. Diffuse calcification of the anterior leaflet. There was mild regurgitation. - Left atrium: The atrium  was mildly dilated. - Tricuspid valve: There was trivial regurgitation. - Pulmonary arteries: PA peak pressure: 39 mm Hg (S). Impressions: - The right ventricular systolic pressure was increased consistent with mild pulmonary hypertension.   History of Present Illness     Charles Mallozzi [name] a 83 y.o.malewith a history of CABG 2004 w/ LIMA-LAD, SVG-RCA, SVG-OM, SVG-RI, RAS w/ bilat 70% dz, s/p L-CEA, HTN, HLD, NIDDM, MV 2014 no ischemia, A flutter dx 2016 s/p TEE/DCCV 11/02/2016, Eliquis w/ CHADS2VASC=5 (age x 2, HTN, CAD, DM), mod-severe AS by echo 2016 (peak gradient 49), echo 08/2017 w/ peak gradient now 58  Pt directly admitted from the office 09/06/17 for symptomatic acute on chronic diastolic CHF.  Hospital Course     Consultants: None  1. Acute on Chronic Diastolic CHF:  Pt directly admitted from the office 09/06/17 for symptomatic acute on chronic diastolic CHF in the setting of dietary indiscretion with sodium. He admits to eating a sausage, egg, and cheese biscuit for breakfast every morning. Pt was having progressively worsening fatigue and feeling extremely SOB with minimal exercise tolerance even after medication adjustments during an office visit 09/02/17. He had no CP or palpitations during this time.   At the 09/02/17 visit,  his weight was up 9lbs in which Lasix 80mg  PO BID was changed to Demedex 40mg  PO BID with no improvements in symptoms. He continued to have increased weight gain, exertional dyspnea, and fatigue.   His admission BNP was elevated at 587 on 09/06/17. CXR was completed and showed no cardiopulmonary disease. He was started on IV Lasix, 80mg  with good urine output. He has diuresed a total of 9L since admission. He has been given  K+ supplementation with his diuretics. He has had a 5 pound weight loss (210lb>>>205lb) since admission and he is now symptom free. He will need to continue Torsemide 40mg  BID and be followed-up closely with Dr. [name] to prevent  rehospitalization.   He has been counseled on decreasing sodium in his diet and ways to monitor this.   Pt will need to be evaluated for home health PT before discharge. Will follow their recommendations on outpatient therapy.   2. Aortic Stenosis: Recent echocardiogram (08/11/17) reveals a mean gradient 32, peak 58. At some point, it may be beneficial to refer to TAVR clinic.  3. Symptomatic Bradycardia:  Metoprolol 25mg  PO BID was discontinued with improvement in HR. Avoid rate lowering medications in the future. Pt is in [place]'s I AV block on telemetry.   4. Hypokalemia:  Continue to supplement given his aggressive diuresis. Home dose was increased from 71meq daily to 87meq BID.   6. H/O Atrial Flutter:  Continue Eliquis therapy and monitor for s/s of bleeding.   7. HTN: Pt is on his home medications. SBP elevated, adjust meds as outpatient.   8. Deconditioning: PT to see pt before discharge. Hopeful plan for home health PT.   Pt was seen and examined by Dr. Marlou Porch who feels that the patient is stable for discharge today.   Discharge Vitals Blood pressure (!) 158/62, pulse 98, temperature 98.5 F (36.9 C), temperature source Oral, resp. rate 20, height 6\' 1"  (1.854 m), weight 205 lb (93 kg), SpO2 97 %.  Filed Weights   09/07/17 0542 09/08/17 0226 09/09/17 0422  Weight: 210 lb (95.3 kg) 207 lb (93.9 kg) 205 lb (93 kg)    Labs & Radiologic Studies    CBC    Component Value Date/Time   WBC 6.9 09/06/2017 1026   RBC 4.23 09/06/2017 1026   HGB 12.8 (L) 09/06/2017 1026   HGB 14.7 09/10/2011   HCT 39.4 09/06/2017 1026   PLT 172 09/06/2017 1026   PLT 193 09/10/2011   MCV 93.1 09/06/2017 1026   MCH 30.3 09/06/2017 1026   MCHC 32.5 09/06/2017 1026   RDW 13.3 09/06/2017 1026   LYMPHSABS 1.3 01/28/2017 1231   MONOABS 0.8 01/28/2017 1231   EOSABS 0.3 01/28/2017 1231   BASOSABS 0.1 01/28/2017 3825    Basic Metabolic Panel Recent Labs    09/08/17 0621 09/09/17 0414    NA 138 137  K 3.6 3.6  CL 97* 98*  CO2 32 30  GLUCOSE 121* 109*  BUN 15 18  CREATININE 0.92 1.06  CALCIUM 9.2 9.4   Liver Function Tests Recent Labs    09/07/17 0430  AST 17  ALT 16*  ALKPHOS 74  BILITOT 0.7  PROT 6.4*  ALBUMIN 3.5    Cardiac Enzymes Lab Results  Component Value Date   TROPONINI 0.06 (H) 06/25/2015    BNP    Component Value Date/Time   BNP 587.9 (H) 09/06/2017 1028    ProBNP    Component Value Date/Time   PROBNP 537.0 (H) 01/28/2017 1231    Thyroid Function Tests Recent Labs    09/06/17 1920  TSH 1.832   _____________  Dg Chest 2 View  Result Date: 09/06/2017 CLINICAL DATA:  Cough and short of breath.  Diabetic hypertension EXAM: CHEST  2 VIEW COMPARISON:  11/01/2016 FINDINGS: CABG.  Negative for heart failure. Mild bibasilar atelectasis.  No definite pneumonia or effusion. IMPRESSION: No active cardiopulmonary disease. Electronically Signed   By: Franchot Gallo M.D.  On: 09/06/2017 11:03   Disposition   Pt is being discharged home today in good condition.  Follow-up Plans & Appointments    Follow-up Information    Barrett, Evelene Croon, PA-C Follow up on 09/19/2017.   Specialties:  Cardiology, Radiology Why:  Please arrive at 10:45 am for an 11:00 am appt. Contact information: [address] [place] [address] [phone]            Discharge Medications   Allergies as of 09/09/2017      Reactions   Gabapentin Other (See Comments)   Leg pain, weakness   Spironolactone Other (See Comments)   Painful gynecomastia   Amlodipine Other (See Comments)   Edema   Other Other (See Comments)   myalgias Horse serum   Rosiglitazone Maleate Other (See Comments)   REACTION: Did not help   Tricor [fenofibrate] Other (See Comments)   myalgias      Medication List    TAKE these medications   ACCU-CHEK AVIVA PLUS test strip Generic drug:  glucose blood CHECK BLOOD SUGAR THREE TIMES A DAY AND AS DIRECTED.    acetaminophen 500 MG tablet Commonly known as:  TYLENOL Take 1 tablet (500 mg total) by mouth 3 (three) times daily.   atorvastatin 20 MG tablet Commonly known as:  LIPITOR TAKE 1 TABLET DAILY.   B-12 500 MCG Tabs Take 1 tablet by mouth daily.   BAYER MICROLET LANCETS lancets Ck blood sugar three times daily and as directed. Dx 250.00. Insulin dependent   citalopram 20 MG tablet Commonly known as:  CELEXA Take 1 tablet (20 mg total) by mouth daily.   ELIQUIS 5 MG Tabs tablet Generic drug:  apixaban Take 1 tablet (5 mg total) by mouth 2 (two) times daily. What changed:  Another medication with the same name was removed. Continue taking this medication, and follow the directions you see here.   fluticasone 50 MCG/ACT nasal spray Commonly known as:  FLONASE Place 2 sprays into both nostrils as needed for allergies or rhinitis.   HYDROcodone-acetaminophen 5-325 MG tablet Commonly known as:  NORCO/VICODIN Take 0.5-1 tablets by mouth 2 (two) times daily as needed for moderate pain.   Insulin Syringe-Needle U-100 30G X 1/2" 1 ML Misc Commonly known as:  B-D INS SYR ULTRAFINE 1CC/30G 1 Syringe by Does not apply route as directed.   Insulin Syringe-Needle U-100 30G X 1/2" 0.5 ML Misc Commonly known as:  B-D INS SYRINGE 0.5CC/30GX1/2" Use as directed to inject insulin. Dx: E11.65   LANTUS 100 UNIT/ML injection Generic drug:  insulin glargine Inject 0.35 mLs (35 Units total) into the skin at bedtime.   lisinopril 40 MG tablet Commonly known as:  PRINIVIL,ZESTRIL Take 1 tablet (40 mg total) by mouth daily.   metFORMIN 1000 MG tablet Commonly known as:  GLUCOPHAGE Take 1 tablet (1,000 mg total) by mouth 2 (two) times daily with a meal.   pantoprazole 20 MG tablet Commonly known as:  PROTONIX Take 1 tablet (20 mg total) by mouth daily.   potassium chloride SA 20 MEQ tablet Commonly known as:  K-DUR,KLOR-CON Take 1 tablet (20 mEq total) by mouth 2 (two) times daily. What  changed:    when to take this  additional instructions   saw palmetto 160 MG capsule Take 450 mg by mouth daily.   tamsulosin 0.4 MG Caps capsule Commonly known as:  FLOMAX Take 1 capsule (0.4 mg total) by mouth daily after supper.   torsemide 20 MG tablet Commonly  known as:  DEMADEX Take 2 tablets (40 mg total) by mouth 2 (two) times daily.   traZODone 50 MG tablet Commonly known as:  DESYREL Take 2 tablets (100 mg total) by mouth at bedtime.   vitamin C 500 MG tablet Commonly known as:  ASCORBIC ACID Take 500 mg by mouth daily.       Outstanding Labs/Studies    Duration of Discharge Encounter   Greater than 30 minutes including physician time.  Signed, Kathyrn Drown NP 09/09/2017, 12:11 PM  Personally seen and examined. Agree with above. 1. Acute on Chronic Diastolic CHF:  - Once again counseled him on decreasing sodium in diet.  Weight is down here in the hospital, 205 from 210 pounds.  9 L diuresis.  He is feeling better.  Lungs are improved.  I am okay with discharge. - Continue with outpatient torsemide 40 mg twice a day with close follow-up, TOC within 1-2 weeks to help prevent rehospitalization.  2. Aortic Stenosis:  - recent echo results above - mean gradient 32, peak 58 - at some point, referral to TAVR clinic when necessary  3. Symptomatic Bradycardia:  -Heart rate has improved after discontinuation of metoprolol 25 mg twice a day.  4. Hypokalemia: -on supplement, follow as outpatient  5. DM:  - on home rx + SSI  6. H/o Atrial Flutter:  - not in flutter now - continue Eliquis, continue to monitor for signs of bleeding.  Okay for discharge.  I think he would benefit from home health PT.  He does appear frail.  [name], MD

## 2017-09-09 NOTE — Care Management Important Message (Signed)
Important Message  Patient Details  Name: Charles [name] MRN: [id number] Date of Birth: Feb 23, 1932   Medicare Important Message Given:  Yes    [name] [address] 09/09/2017, 12:45 PM

## 2017-09-09 NOTE — Care Management Note (Signed)
Case Management Note  Patient Details  Name: BRANTLEE PENN MRN: [id number] Date of Birth: 07/08/1932  Subjective/Objective:  CHF                 Action/Plan: CM talked to patient about DCP; PCP is Dr [name]; has private insurance with [facility] with prescription drug coverage; pt states no problem getting his medication; DME - walker and cane at home; [facility] choice offered, pt chose [facility]; Tim with [facility] called for arrangements. Expected Discharge Date:  09/09/17               Expected Discharge Plan:  [place]  In-House Referral:   [facility]  Discharge planning Services  CM Consult    HH Arranged:  RN, Disease Management, PT [place] Agency:  [facility] (now [facility])  Status of Service:  In process, will continue to follow  Sherrilyn Rist [phone] 09/09/2017, 2:08 PM

## 2017-09-14 ENCOUNTER — Telehealth: Payer: Self-pay

## 2017-09-14 NOTE — Telephone Encounter (Signed)
plz touch base with both - will he be receiving care from [facility] or [facility]?

## 2017-09-14 NOTE — Telephone Encounter (Signed)
Copied from [place]. Topic: Inquiry >> Sep 13, 2017  4:50 PM Patrice Paradise wrote: Reason for CRM: Alphonzo Lemmings from [facility] is requesting verbal authorization to go out to see the patient on 09/14/17. Pls give her a call @ [phone]. >> Sep 14, 2017 11:40 AM Burnis Medin, NT wrote: Darlina Guys is calling back from [place] about orders that was received from the hospital. She wanted to speak to the doctor or nurse about this and would like a call back. Call back number [phone]

## 2017-09-15 ENCOUNTER — Ambulatory Visit (INDEPENDENT_AMBULATORY_CARE_PROVIDER_SITE_OTHER): Payer: Medicare Other

## 2017-09-15 VITALS — BP 140/70 | HR 96 | Temp 97.7°F | Ht 69.0 in | Wt 211.8 lb

## 2017-09-15 DIAGNOSIS — Z0000 Encounter for general adult medical examination without abnormal findings: Secondary | ICD-10-CM | POA: Diagnosis not present

## 2017-09-15 NOTE — Patient Instructions (Signed)
Mr. Guardia , Thank you for taking time to come for your Medicare Wellness Visit. I appreciate your ongoing commitment to your health goals. Please review the following plan we discussed and let me know if I can assist you in the future.   These are the goals we discussed: Goals    . DIET - INCREASE WATER INTAKE     Starting 09/15/2017, I will continue to drink 1 Liter of water daily.        This is a list of the screening recommended for you and due dates:  Health Maintenance  Topic Date Due  . Tetanus Vaccine  09/15/2018*  . DTaP/Tdap/Td vaccine (1 - Tdap) 10/03/2018*  . Eye exam for diabetics  10/03/2018*  . Complete foot exam   10/28/2017  . Hemoglobin A1C  12/29/2017  . Colon Cancer Screening  10/08/2018  . Flu Shot  Completed  . Pneumonia vaccines  Completed  *Topic was postponed. The date shown is not the original due date.   Preventive Care for Adults  A healthy lifestyle and preventive care can promote health and wellness. Preventive health guidelines for adults include the following key practices.  . A routine yearly physical is a good way to check with your health care provider about your health and preventive screening. It is a chance to share any concerns and updates on your health and to receive a thorough exam.  . Visit your dentist for a routine exam and preventive care every 6 months. Brush your teeth twice a day and floss once a day. Good oral hygiene prevents tooth decay and gum disease.  . The frequency of eye exams is based on your age, health, family medical history, use  of contact lenses, and other factors. Follow your health care provider's recommendations for frequency of eye exams.  . Eat a healthy diet. Foods like vegetables, fruits, whole grains, low-fat dairy products, and lean protein foods contain the nutrients you need without too many calories. Decrease your intake of foods high in solid fats, added sugars, and salt. Eat the right amount of  calories for you. Get information about a proper diet from your health care provider, if necessary.  . Regular physical exercise is one of the most important things you can do for your health. Most adults should get at least 150 minutes of moderate-intensity exercise (any activity that increases your heart rate and causes you to sweat) each week. In addition, most adults need muscle-strengthening exercises on 2 or more days a week.  Silver Sneakers may be a benefit available to you. To determine eligibility, you may visit the website: www.silversneakers.com or contact program at (701)342-4403 Mon-Fri between 8AM-8PM.   . Maintain a healthy weight. The body mass index (BMI) is a screening tool to identify possible weight problems. It provides an estimate of body fat based on height and weight. Your health care provider can find your BMI and can help you achieve or maintain a healthy weight.   For adults 20 years and older: ? A BMI below 18.5 is considered underweight. ? A BMI of 18.5 to 24.9 is normal. ? A BMI of 25 to 29.9 is considered overweight. ? A BMI of 30 and above is considered obese.   . Maintain normal blood lipids and cholesterol levels by exercising and minimizing your intake of saturated fat. Eat a balanced diet with plenty of fruit and vegetables. Blood tests for lipids and cholesterol should begin at age 77 and be repeated every 5  years. If your lipid or cholesterol levels are high, you are over 50, or you are at high risk for heart disease, you may need your cholesterol levels checked more frequently. Ongoing high lipid and cholesterol levels should be treated with medicines if diet and exercise are not working.  . If you smoke, find out from your health care provider how to quit. If you do not use tobacco, please do not start.  . If you choose to drink alcohol, please do not consume more than 2 drinks per day. One drink is considered to be 12 ounces (355 mL) of beer, 5 ounces  (148 mL) of wine, or 1.5 ounces (44 mL) of liquor.  . If you are 22-17 years old, ask your health care provider if you should take aspirin to prevent strokes.  . Use sunscreen. Apply sunscreen liberally and repeatedly throughout the day. You should seek shade when your shadow is shorter than you. Protect yourself by wearing long sleeves, pants, a wide-brimmed hat, and sunglasses year round, whenever you are outdoors.  . Once a month, do a whole body skin exam, using a mirror to look at the skin on your back. Tell your health care provider of new moles, moles that have irregular borders, moles that are larger than a pencil eraser, or moles that have changed in shape or color.

## 2017-09-15 NOTE — Progress Notes (Signed)
Subjective:   Charles [name] is a 83 y.o. male who presents for Medicare Annual/Subsequent preventive examination.  Review of Systems:  N/A Cardiac Risk Factors include: advanced age (>59men, >21 women);diabetes mellitus;male gender;dyslipidemia;hypertension     Objective:    Vitals: BP 140/70 (BP Location: Right Arm, Patient Position: Sitting, Cuff Size: Normal)   Pulse 96   Temp 97.7 F (36.5 C) (Oral)   Ht 5\' 9"  (1.753 m) Comment: shoes; unable to stand erect  Wt 211 lb 12 oz (96 kg)   SpO2 92%   BMI 31.27 kg/m   Body mass index is 31.27 kg/m.  Advanced Directives 09/15/2017 09/06/2017 11/02/2016 06/08/2016 02/02/2016 06/24/2015  Does Patient Have a Medical Advance Directive? Yes Yes Yes Yes No No  Type of Paramedic of [place];Living will Healthcare Power of [place];Living will [place] - -  Does patient want to make changes to medical advance directive? - No - Patient declined - No - Patient declined - -  Copy of [place] in Chart? No - copy requested No - copy requested No - copy requested No - copy requested - -  Would patient like information on creating a medical advance directive? - No - Patient declined - - Yes - Educational materials given No - patient declined information    Tobacco Social History   Tobacco Use  Smoking Status Former Smoker  . Packs/day: 0.50  . Years: 36.00  . Pack years: 18.00  . Types: Cigarettes  . Last attempt to quit: 07/23/2013  . Years since quitting: 4.1  Smokeless Tobacco Never Used     Counseling given: No   Clinical Intake:  Pre-visit preparation completed: Yes  Pain : No/denies pain Pain Score: 0-No pain     Nutritional Risks: None Diabetes: Yes CBG done?: No Did pt. bring in CBG monitor from home?: No  How often do you need to have someone help you when you read instructions, pamphlets, or other written materials from your  doctor or pharmacy?: 1 - Never What is the last grade level you completed in school?: 12th grade  Interpreter Needed?: No  Comments: pt lives alone Information entered by :: LPinson, LPN  Past Medical History:  Diagnosis Date  . Abdominal aortic atherosclerosis ([place])    by xray  . Aortic stenosis 06/2014   moderate-severe  . Arthritis    in lower back (06/24/2015)  . Atrial flutter ([place])    notes 06/24/2015  . AV block, [place] 1 09/06/2017   Archie Endo 09/06/2017  . 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 CAD (coronary artery disease) 2004   a. s/p CABG in 2004 with LIMA-LAD, SVG-RCA, SVG-OM, and SVG-RI  . Carotid stenosis 1999   s/p L CEA  . CHF (congestive heart failure) ([place]) dx'd 06/2015   a. EF 55-60% by echo in 2016. b. 10/2016: EF reduced to 30-35% by TEE in the setting of atrial fibrillation with RVR  . Colon polyp 2005   (Dr. Tiffany Kocher)  . Compression fracture of L1 lumbar vertebra (HCC) remote  . Compression fracture of L1 lumbar vertebra (HCC) 02/2016   chronic by xray  . COPD (chronic obstructive pulmonary disease) ([place]) 03/2011   by xray  . DDD (degenerative disc disease), lumbar 2014   severe L2/3 L5/S1 by xray  . Depression    hx  . Ex-smoker   . GERD (gastroesophageal reflux disease) 2003  h/o duodenal ulcer per EGD as well as esophagitis  . History of chicken pox   . History of colon polyps 2003, 2005   adenomatous Vira Agar)  . HLD (hyperlipidemia)   . HTN (hypertension)   . Psoriasis   . Renal artery stenosis ([place]) 2004   70% bilateral, followed by cards  . Seasonal allergies   . T2DM (type 2 diabetes mellitus) ([place]) 1995  . Urge incontinence of urine    Past Surgical History:  Procedure Laterality Date  . CARDIAC CATHETERIZATION  03/06/2003   No intervention - recommend CABG  . CARDIOVASCULAR STRESS TEST  11/2010   normal perfusion, no evidence of ischemia, EF 62% post exercise  . CARDIOVASCULAR STRESS TEST  11/28/2012   Mild  diaphragmatic attenuation; cannot exclude a focal region of nontransmural inferior scar  . CARDIOVERSION N/A 06/25/2015   Procedure: CARDIOVERSION;  Surgeon: Pixie Casino, MD;  Location: [facility] ENDOSCOPY;  Service: Cardiovascular;  Laterality: N/A;  . CARDIOVERSION N/A 11/02/2016   Procedure: CARDIOVERSION;  Surgeon: [name], MD;  Location: [facility];  Service: Cardiovascular;  Laterality: N/A;  . CAROTID ENDARTERECTOMY Left 1999   ([place])  . CATARACT EXTRACTION W/ INTRAOCULAR LENS  IMPLANT, BILATERAL Bilateral 01/2013   [place]  . COLONOSCOPY  2003   colon polyp x3 - adenomatous Tiffany Kocher)  . COLONOSCOPY  10/08/2012   2 TA, diverticulosis, int hem, no rpt rec Tiffany Kocher)  . CORONARY ANGIOPLASTY    . CORONARY ARTERY BYPASS GRAFT  03/07/2003   4v CABG ([place]) with LIMA to LAD, vein graft to RCA, 1st obtuse marginal, and ramus intermedius  . ESOPHAGOGASTRODUODENOSCOPY  10/08/2012   nl esophagus, duodenitis and erosive gastropathy, path - gastropathy no Hpylori, no rpt rec  . LUMBAR EPIDURAL INJECTION  03/2017   L5/S1 ([place])  . PERIPHERAL VASCULAR CATHETERIZATION N/A 02/02/2016   Procedure: Renal Angiography;  Surgeon: [name], MD;  Location: [facility] CV LAB;  Service: Cardiovascular;  Laterality: N/A;  . PERIPHERAL VASCULAR CATHETERIZATION N/A 02/02/2016   Procedure: Abdominal Aortogram;  Surgeon: [name], MD;  Location: [facility] CV LAB;  Service: Cardiovascular;  Laterality: N/A;  . RENAL DOPPLER  11/29/2011   Celiac&SMA-demonstrated vessel narrowing suggestive of a greater than 50% diameter reduction. Bilateral renal arteries-demonstrated vessel narrowing of 60-99% diameter reduction. Rt Kidney-mid pole lateral simple cyst noted measuring 1.29x0.76x1.11cm and exophytic cyst outside lower pole measuring 1.23x0.96x1.31. Lft Kidney-lateral mid to lower pole simple cyst measuering-1.24x9.83x1.24  . TEE WITHOUT CARDIOVERSION N/A 06/25/2015   Procedure: TRANSESOPHAGEAL  ECHOCARDIOGRAM (TEE);  Surgeon: Pixie Casino, MD;  Location[facility] ENDOSCOPY;  Service: Cardiovascular;  Laterality: N/A;  . TEE WITHOUT CARDIOVERSION N/A 11/02/2016   Procedure: TRANSESOPHAGEAL ECHOCARDIOGRAM (TEE);  Surgeon: [name], MD;  Location: [facility] ENDOSCOPY;  Service: Cardiovascular;  Laterality: N/A;  . TONSILLECTOMY    . UPPER GASTROINTESTINAL ENDOSCOPY  2003   reflux esophagitis, erosive gastropathy, duodenal ulcer   Family History  Problem Relation Age of Onset  . Hypertension Father   . Heart disease Father   . Cancer Mother        GI cancer  . Diabetes Sister   . Heart disease Sister   . Stroke Sister   . Hypertension Sister   . Cancer Brother        Lung cancer - nonsmoker  . Cancer Maternal Grandmother   . Heart attack Maternal Grandfather   . Heart attack Paternal Grandmother   . Heart attack Paternal Grandfather   . Cancer Brother  Pancreatic cancer  . Lung disease Brother    Social History   Socioeconomic History  . Marital status: Widowed    Spouse name: None  . Number of children: None  . Years of education: None  . Highest education level: None  Social Needs  . Financial resource strain: None  . Food insecurity - worry: None  . Food insecurity - inability: None  . Transportation needs - medical: None  . Transportation needs - non-medical: None  Occupational History  . None  Tobacco Use  . Smoking status: Former Smoker    Packs/day: 0.50    Years: 36.00    Pack years: 18.00    Types: Cigarettes    Last attempt to quit: 07/23/2013    Years since quitting: 4.1  . Smokeless tobacco: Never Used  Substance and Sexual Activity  . Alcohol use: No  . Drug use: No  . Sexual activity: Not Currently  Other Topics Concern  . None  Social History Narrative   Caffeine: 4 cups coffee/day, some tea and soda   Lives with wife   Occupation: Retired   Activity: no regular exercise   Diet: good water, fruits/vegetables daily    Outpatient  Encounter Medications as of 09/15/2017  Medication Sig  . ACCU-CHEK AVIVA PLUS test strip CHECK BLOOD SUGAR THREE TIMES A DAY AND AS DIRECTED.  [name] acetaminophen (TYLENOL) 500 MG tablet Take 1 tablet (500 mg total) by mouth 3 (three) times daily.  [name] atorvastatin (LIPITOR) 20 MG tablet TAKE 1 TABLET DAILY.  [name] BAYER MICROLET LANCETS lancets Ck blood sugar three times daily and as directed. Dx 250.00. Insulin dependent  . citalopram (CELEXA) 20 MG tablet Take 1 tablet (20 mg total) by mouth daily.  . Cyanocobalamin (B-12) 500 MCG TABS Take 1 tablet by mouth daily.  [name] ELIQUIS 5 MG TABS tablet Take 1 tablet (5 mg total) by mouth 2 (two) times daily.  . fluticasone (FLONASE) 50 MCG/ACT nasal spray Place 2 sprays into both nostrils as needed for allergies or rhinitis.  [name] HYDROcodone-acetaminophen (NORCO/VICODIN) 5-325 MG tablet Take 0.5-1 tablets by mouth 2 (two) times daily as needed for moderate pain.  . Insulin Syringe-Needle U-100 (B-D INS SYR ULTRAFINE 1CC/30G) 30G X 1/2" 1 ML MISC 1 Syringe by Does not apply route as directed.  . Insulin Syringe-Needle U-100 (B-D INS SYRINGE 0.5CC/30GX1/2") 30G X 1/2" 0.5 ML MISC Use as directed to inject insulin. Dx: E11.65  . LANTUS 100 UNIT/ML injection Inject 0.35 mLs (35 Units total) into the skin at bedtime.  [name] lisinopril (PRINIVIL,ZESTRIL) 40 MG tablet Take 1 tablet (40 mg total) by mouth daily.  . metFORMIN (GLUCOPHAGE) 1000 MG tablet Take 1 tablet (1,000 mg total) by mouth 2 (two) times daily with a meal.  . pantoprazole (PROTONIX) 20 MG tablet Take 1 tablet (20 mg total) by mouth daily.  . potassium chloride SA (K-DUR,KLOR-CON) 20 MEQ tablet Take 1 tablet (20 mEq total) by mouth 2 (two) times daily.  . saw palmetto 160 MG capsule Take 450 mg by mouth daily.   . tamsulosin (FLOMAX) 0.4 MG CAPS capsule Take 1 capsule (0.4 mg total) by mouth daily after supper.  . torsemide (DEMADEX) 20 MG tablet Take 2 tablets (40 mg total) by mouth 2 (two) times daily.  .  traZODone (DESYREL) 50 MG tablet Take 2 tablets (100 mg total) by mouth at bedtime.  . vitamin C (ASCORBIC ACID) 500 MG tablet Take 500 mg by mouth daily.   No facility-administered  encounter medications on file as of 09/15/2017.     Activities of Daily Living In your present state of health, do you have any difficulty performing the following activities: 09/15/2017 09/06/2017  Hearing? Tempie Donning  Vision? Y N  Comment vision loss in left eye -  Difficulty concentrating or making decisions? N N  Walking or climbing stairs? Y Y  Comment - "some difficulty"  Dressing or bathing? N Y  Doing errands, shopping? N N  Preparing Food and eating ? N -  Using the Toilet? N -  In the past six months, have you accidently leaked urine? Y -  Comment dribbling; wears pullups daily -  Do you have problems with loss of bowel control? N -  Managing your Medications? N -  Managing your Finances? N -  Housekeeping or managing your Housekeeping? N -  Some recent data might be hidden    Patient Care Team: [name], MD as PCP - General (Family Medicine) [name], MD as Consulting Physician (Cardiology)   Assessment:   This is a routine wellness examination for [place].  Exercise Activities and Dietary recommendations Current Exercise Habits: The patient does not participate in regular exercise at present, Exercise limited by: orthopedic condition(s)  Goals    . DIET - INCREASE WATER INTAKE     Starting 09/15/2017, I will continue to drink 1 Liter of water daily.        Fall Risk Fall Risk  09/15/2017 06/08/2016 10/08/2014 10/02/2013 09/15/2012  Falls in the past year? No Yes No No Yes  Comment - pt tripped on corner of throw rug - - fell carrying 19 in TV  Number falls in past yr: - 1 - - 1  Injury with Fall? - No - - No  Follow up - Falls evaluation completed;Education provided;Falls prevention discussed - - -   Depression Screen PHQ 2/9 Scores 09/15/2017 06/08/2016 10/08/2014  10/02/2013  PHQ - 2 Score 0 3 0 0  PHQ- 9 Score 0 6 - -    Cognitive Function MMSE - Mini Mental State Exam 09/15/2017 06/08/2016  Orientation to time 5 5  Orientation to Place 5 5  Registration 3 3  Attention/ Calculation 0 0  Recall 3 2  Recall-comments - pt was unable to recall 1 of 3 words  Language- name 2 objects 0 0  Language- repeat 1 1  Language- follow 3 step command 3 3  Language- read & follow direction 0 0  Write a sentence 0 0  Copy design 0 0  Total score 20 19     PLEASE NOTE: A Mini-Cog screen was completed. Maximum score is 20. A value of 0 denotes this part of [place] MMSE was not completed or the patient failed this part of the Mini-Cog screening.   Mini-Cog Screening Orientation to Time - Max 5 pts Orientation to Place - Max 5 pts Registration - Max 3 pts Recall - Max 3 pts Language Repeat - Max 1 pts Language Follow 3 Step Command - Max 3 pts     Immunization History  Administered Date(s) Administered  . Influenza Split 07/04/2012  . Influenza Whole 07/04/2005  . Influenza, High Dose Seasonal PF 08/24/2017  . Influenza,inj,Quad PF,6+ Mos 06/25/2015  . Influenza-Unspecified 08/10/2013, 07/04/2014, 07/15/2016  . Pneumococcal Conjugate-13 04/02/2014  . Pneumococcal Polysaccharide-23 10/05/2002  . Td 07/20/2005    Screening Tests Health Maintenance  Topic Date Due  . TETANUS/TDAP  09/15/2018 (Originally 07/21/2015)  . DTaP/Tdap/Td (1 - Tdap)  10/03/2018 (Originally 07/21/2005)  . OPHTHALMOLOGY EXAM  10/03/2018 (Originally 09/03/2014)  . FOOT EXAM  10/28/2017  . HEMOGLOBIN A1C  12/29/2017  . COLONOSCOPY  10/08/2018  . INFLUENZA VACCINE  Completed  . PNA vac Low Risk Adult  Completed     Plan:     I have personally reviewed, addressed, and noted the following in the patient's chart:  A. Medical and social history B. Use of alcohol, tobacco or illicit drugs  C. Current medications and supplements D. Functional ability and status E.    Nutritional status F.  Physical activity G. Advance directives H. List of other physicians I.  Hospitalizations, surgeries, and ER visits in previous 12 months J.  [place] to include hearing, vision, cognitive, depression L. Referrals and appointments - none  In addition, I have reviewed and discussed with patient certain preventive protocols, quality metrics, and best practice recommendations. A written personalized care plan for preventive services as well as general preventive health recommendations were provided to patient.  See attached scanned questionnaire for additional information.   Signed,   Lindell Noe, MHA, BS, LPN Health Coach

## 2017-09-15 NOTE — Telephone Encounter (Signed)
Spoke with [place] with [facility]. States pt will receive care from them. Need verbal orders for RN to see pt for disease management and for PT to eval and treat. They are going to see pt today.    When returning call @ [phone], ask for [facility] or her vm.

## 2017-09-15 NOTE — Telephone Encounter (Signed)
Ok to give verbal orders for this. Thanks.

## 2017-09-15 NOTE — Progress Notes (Signed)
PCP notes:   Health maintenance:  Tetanus - postponed/insurance Eye exam - addressed  Abnormal screenings:   None  Patient concerns:   None  Nurse concerns:  None  Next PCP appt:   09/20/17 @ 0930

## 2017-09-15 NOTE — Progress Notes (Signed)
Pt scheduled to see Rosaria Ferries, PA on 09/19/17.

## 2017-09-15 NOTE — Progress Notes (Signed)
Pre visit review using our clinic review tool, if applicable. No additional management support is needed unless otherwise documented below in the visit note. 

## 2017-09-16 NOTE — Telephone Encounter (Signed)
Spoke with [place] relaying message per Dr. Darnell Level.  Says ok.

## 2017-09-18 NOTE — Progress Notes (Signed)
I reviewed health advisor's note, was available for consultation, and agree with documentation and plan.  

## 2017-09-19 ENCOUNTER — Ambulatory Visit: Payer: Medicare Other | Admitting: Physician Assistant

## 2017-09-20 ENCOUNTER — Encounter: Payer: Medicare Other | Admitting: Family Medicine

## 2017-09-20 ENCOUNTER — Other Ambulatory Visit: Payer: Self-pay | Admitting: Family Medicine

## 2017-09-20 NOTE — Telephone Encounter (Signed)
Spoke with pt's son, [name] (on [place]), says pt had appt with cardio and was admitted due to excessive fluid on his heart.  Pt is doing a lot better and they just forgot about CPE this morning due to so many recent appts.  Has f/u appt with cardio 09/22/17.  And [name] will call back to schedule CPE.

## 2017-09-20 NOTE — Telephone Encounter (Signed)
Has OV today

## 2017-09-20 NOTE — Telephone Encounter (Signed)
Pt missed appt today 09/20/2017 for CPE. He was recently hospitalized with acute on chronic CHF.  Looks like [facility] hasn't been able to reach patient either to set up [facility].  Can we call pt or son for an update on how he's doing, offer to reschedule CPE or hosp f/u visit? Thanks.

## 2017-09-22 ENCOUNTER — Encounter: Payer: Self-pay | Admitting: Physician Assistant

## 2017-09-22 ENCOUNTER — Ambulatory Visit: Payer: Medicare Other | Admitting: Physician Assistant

## 2017-09-22 VITALS — BP 144/68 | HR 72 | Ht 73.0 in | Wt 207.0 lb

## 2017-09-22 DIAGNOSIS — E876 Hypokalemia: Secondary | ICD-10-CM

## 2017-09-22 DIAGNOSIS — I5032 Chronic diastolic (congestive) heart failure: Secondary | ICD-10-CM

## 2017-09-22 DIAGNOSIS — I350 Nonrheumatic aortic (valve) stenosis: Secondary | ICD-10-CM

## 2017-09-22 DIAGNOSIS — I4892 Unspecified atrial flutter: Secondary | ICD-10-CM

## 2017-09-22 DIAGNOSIS — Z79899 Other long term (current) drug therapy: Secondary | ICD-10-CM | POA: Diagnosis not present

## 2017-09-22 DIAGNOSIS — I10 Essential (primary) hypertension: Secondary | ICD-10-CM | POA: Diagnosis not present

## 2017-09-22 MED ORDER — TORSEMIDE 20 MG PO TABS: 40.0000 mg | ORAL_TABLET | Freq: Two times a day (BID) | ORAL | 3 refills | Status: DC

## 2017-09-22 MED ORDER — POTASSIUM CHLORIDE CRYS ER 20 MEQ PO TBCR: 20.0000 meq | EXTENDED_RELEASE_TABLET | Freq: Two times a day (BID) | ORAL | 11 refills | Status: DC

## 2017-09-22 NOTE — Progress Notes (Signed)
Cardiology Office Note   Date:  09/22/2017   ID:  Charles [name], DOB 01-07-1932, MRN [id number]  PCP:  [name], MD  Cardiologist: Dr. [name] 01/19/2017 Charles Ferries, PA-C 09/06/2017  No chief complaint on file.   History of Present Illness: Charles [name] is a 83 y.o. male with a history of CABG 2004 w/ LIMA-LAD, SVG-RCA, SVG-OM, SVG-RI, RAS w/ bilat 70% dz, s/p L-CEA, HTN, HLD, NIDDM, MV 2014 no ischemia, A flutter dx 2016 s/p TEE/DCCV 11/02/2016, on Eliquis w/ CHADS2VASC=5 (age x 2, HTN, CAD, DM), mod-severe AS by echo 08/2017 (peak gradient 58)  Office visit 12/04, ++volume overload>>admit Admitted 12/4-12/04/2017 for acute on chronic diastolic CHF, weight at discharge 205 pounds   [name] presents for cardiology follow up.  He is here today with his daughter-in-law.  Since d/c from the hospital, he has been able to walk more.  The swelling in his legs is greatly improved, he just sees a little bit during the day.  He denies orthopnea or PND.  His weight has been pretty good at home, it was 209.9 at first, today was 207.6. He checks his BP at home, SBP high 130s-140s. He had a problem with swelling and bradycardia with metoprolol, is willing to try Coreg.   His son does the cooking, cooks mostly fresh foods and does not add salt.  The patient is not over drinking.  He is compliant with his medications.  He has not had chest pain.  He has not had palpitations, no presyncope or syncope.   Past Medical History:  Diagnosis Date  . Abdominal aortic atherosclerosis ([place])    by xray  . Aortic stenosis 06/2014   moderate-severe  . Arthritis    in lower back (06/24/2015)  . Atrial flutter ([place])    notes 06/24/2015  . AV block, [place] 1 09/06/2017   Archie Endo 09/06/2017  . 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 CAD (coronary artery disease) 2004   a. s/p CABG in 2004 with LIMA-LAD, SVG-RCA, SVG-OM, and SVG-RI  . Carotid stenosis  1999   s/p L CEA  . CHF (congestive heart failure) ([place]) dx'd 06/2015   a. EF 55-60% by echo in 2016. b. 10/2016: EF reduced to 30-35% by TEE in the setting of atrial fibrillation with RVR  . Colon polyp 2005   (Dr. Tiffany Kocher)  . Compression fracture of L1 lumbar vertebra (HCC) remote  . Compression fracture of L1 lumbar vertebra (HCC) 02/2016   chronic by xray  . COPD (chronic obstructive pulmonary disease) ([place]) 03/2011   by xray  . DDD (degenerative disc disease), lumbar 2014   severe L2/3 L5/S1 by xray  . Depression    hx  . Ex-smoker   . GERD (gastroesophageal reflux disease) 2003   h/o duodenal ulcer per EGD as well as esophagitis  . History of chicken pox   . History of colon polyps 2003, 2005   adenomatous Vira Agar)  . HLD (hyperlipidemia)   . HTN (hypertension)   . Psoriasis   . Renal artery stenosis ([place]) 2004   70% bilateral, followed by cards  . Seasonal allergies   . T2DM (type 2 diabetes mellitus) ([place]) 1995  . Urge incontinence of urine     Past Surgical History:  Procedure Laterality Date  . CARDIAC CATHETERIZATION  03/06/2003   No intervention - recommend CABG  . CARDIOVASCULAR STRESS TEST  11/2010   normal perfusion, no evidence of ischemia,  EF 62% post exercise  . CARDIOVASCULAR STRESS TEST  11/28/2012   Mild diaphragmatic attenuation; cannot exclude a focal region of nontransmural inferior scar  . CARDIOVERSION N/A 06/25/2015   Procedure: CARDIOVERSION;  Surgeon: Pixie Casino, MD;  Location: [facility] ENDOSCOPY;  Service: Cardiovascular;  Laterality: N/A;  . CARDIOVERSION N/A 11/02/2016   Procedure: CARDIOVERSION;  Surgeon: [name], MD;  Location: [place];  Service: Cardiovascular;  Laterality: N/A;  . CAROTID ENDARTERECTOMY Left 1999   ([place])  . CATARACT EXTRACTION W/ INTRAOCULAR LENS  IMPLANT, BILATERAL Bilateral 01/2013   [place]  . COLONOSCOPY  2003   colon polyp x3 - adenomatous Tiffany Kocher)  . COLONOSCOPY  10/08/2012   2 TA, diverticulosis, int hem,  no rpt rec Tiffany Kocher)  . CORONARY ANGIOPLASTY    . CORONARY ARTERY BYPASS GRAFT  03/07/2003   4v CABG ([place]) with LIMA to LAD, vein graft to RCA, 1st obtuse marginal, and ramus intermedius  . ESOPHAGOGASTRODUODENOSCOPY  10/08/2012   nl esophagus, duodenitis and erosive gastropathy, path - gastropathy no Hpylori, no rpt rec  . LUMBAR EPIDURAL INJECTION  03/2017   L5/S1 ([place])  . PERIPHERAL VASCULAR CATHETERIZATION N/A 02/02/2016   Procedure: Renal Angiography;  Surgeon: [name], MD;  Location: [place] CV LAB;  Service: Cardiovascular;  Laterality: N/A;  . PERIPHERAL VASCULAR CATHETERIZATION N/A 02/02/2016   Procedure: Abdominal Aortogram;  Surgeon: [name], MD;  Location: [place] CV LAB;  Service: Cardiovascular;  Laterality: N/A;  . RENAL DOPPLER  11/29/2011   Celiac&SMA-demonstrated vessel narrowing suggestive of a greater than 50% diameter reduction. Bilateral renal arteries-demonstrated vessel narrowing of 60-99% diameter reduction. Rt Kidney-mid pole lateral simple cyst noted measuring 1.29x0.76x1.11cm and exophytic cyst outside lower pole measuring 1.23x0.96x1.31. Lft Kidney-lateral mid to lower pole simple cyst measuering-1.24x9.83x1.24  . TEE WITHOUT CARDIOVERSION N/A 06/25/2015   Procedure: TRANSESOPHAGEAL ECHOCARDIOGRAM (TEE);  Surgeon: Pixie Casino, MD;  Location[facility] ENDOSCOPY;  Service: Cardiovascular;  Laterality: N/A;  . TEE WITHOUT CARDIOVERSION N/A 11/02/2016   Procedure: TRANSESOPHAGEAL ECHOCARDIOGRAM (TEE);  Surgeon: [name], MD;  Location: [facility] ENDOSCOPY;  Service: Cardiovascular;  Laterality: N/A;  . TONSILLECTOMY    . UPPER GASTROINTESTINAL ENDOSCOPY  2003   reflux esophagitis, erosive gastropathy, duodenal ulcer    Current Outpatient Medications  Medication Sig Dispense Refill  . ACCU-CHEK AVIVA PLUS test strip CHECK BLOOD SUGAR THREE TIMES A DAY AND AS DIRECTED. 100 each 6  . acetaminophen (TYLENOL) 500 MG tablet Take 1 tablet (500 mg total)  by mouth 3 (three) times daily.    [name] atorvastatin (LIPITOR) 20 MG tablet TAKE 1 TABLET DAILY. 90 tablet 0  . BAYER MICROLET LANCETS lancets Ck blood sugar three times daily and as directed. Dx 250.00. Insulin dependent 100 each 11  . citalopram (CELEXA) 20 MG tablet Take 1 tablet (20 mg total) by mouth daily. 90 tablet 1  . Cyanocobalamin (B-12) 500 MCG TABS Take 1 tablet by mouth daily.    [name] ELIQUIS 5 MG TABS tablet Take 1 tablet (5 mg total) by mouth 2 (two) times daily. 60 tablet 2  . fluticasone (FLONASE) 50 MCG/ACT nasal spray Place 2 sprays into both nostrils as needed for allergies or rhinitis.    [name] HYDROcodone-acetaminophen (NORCO/VICODIN) 5-325 MG tablet Take 0.5-1 tablets by mouth 2 (two) times daily as needed for moderate pain. 60 tablet 0  . Insulin Syringe-Needle U-100 (B-D INS SYR ULTRAFINE 1CC/30G) 30G X 1/2" 1 ML MISC 1 Syringe by Does not apply route as directed.  100 each 3  . Insulin Syringe-Needle U-100 (B-D INS SYRINGE 0.5CC/30GX1/2") 30G X 1/2" 0.5 ML MISC Use as directed to inject insulin. Dx: E11.65 100 each 3  . LANTUS 100 UNIT/ML injection Inject 0.35 mLs (35 Units total) into the skin at bedtime. 10 mL 2  . lisinopril (PRINIVIL,ZESTRIL) 40 MG tablet Take 1 tablet (40 mg total) by mouth daily. 90 tablet 1  . metFORMIN (GLUCOPHAGE) 1000 MG tablet Take 1 tablet (1,000 mg total) by mouth 2 (two) times daily with a meal. 180 tablet 0  . pantoprazole (PROTONIX) 20 MG tablet Take 1 tablet (20 mg total) by mouth daily. 30 tablet 3  . potassium chloride SA (K-DUR,KLOR-CON) 20 MEQ tablet Take 1 tablet (20 mEq total) by mouth 2 (two) times daily. 60 tablet 11  . saw palmetto 160 MG capsule Take 450 mg by mouth daily.     . tamsulosin (FLOMAX) 0.4 MG CAPS capsule Take 1 capsule (0.4 mg total) by mouth daily after supper. 30 capsule 6  . torsemide (DEMADEX) 20 MG tablet Take 2 tablets (40 mg total) by mouth 2 (two) times daily. 120 tablet 3  . traZODone (DESYREL) 50 MG tablet Take 2  tablets (100 mg total) by mouth at bedtime. 180 tablet 0  . vitamin C (ASCORBIC ACID) 500 MG tablet Take 500 mg by mouth daily.     No current facility-administered medications for this visit.     Allergies:   Gabapentin; Metoprolol; Spironolactone; Amlodipine; Other; Rosiglitazone maleate; and Tricor [fenofibrate]    Social History:  The patient  reports that he quit smoking about 4 years ago. His smoking use included cigarettes. He has a 18.00 pack-year smoking history. he has never used smokeless tobacco. He reports that he does not drink alcohol or use drugs.   Family History:  The patient's family history includes Cancer in his brother, brother, maternal grandmother, and mother; Diabetes in his sister; Heart attack in his maternal grandfather, paternal grandfather, and paternal grandmother; Heart disease in his father and sister; Hypertension in his father and sister; Lung disease in his brother; Stroke in his sister.    ROS:  Please see the history of present illness. All other systems are reviewed and negative.    PHYSICAL EXAM: VS:  BP (!) 144/68   Pulse 72   Ht 6\' 1"  (1.854 m)   Wt 207 lb (93.9 kg)   BMI 27.31 kg/m  , BMI Body mass index is 27.31 kg/m. GEN: Well nourished, well developed, male in no acute distress  HEENT: normal for age  Neck: JVD 8 cm, no carotid bruit, no masses Cardiac: RRR; 2/6 murmur, no rubs, or gallops Respiratory: Few rales left base, otherwise clear bilaterally, normal work of breathing GI: soft, nontender, nondistended, + BS MS: no deformity or atrophy; trace edema; distal pulses are 2+ in all 4 extremities   Skin: warm and dry, no rash Neuro:  Strength and sensation are intact Psych: euthymic mood, full affect   EKG:  EKG is not ordered today.  2D Echo 08/08/17 Study Conclusions - Left ventricle: The cavity size was normal. There was severe concentric hypertrophy. Systolic function was normal. The estimated ejection fraction was in  the range of55% to 60%.Wall motion was normal; there were no regional wall motion abnormalities. Features are consistent with a pseudonormal left ventricular filling pattern, with concomitant abnormal relaxation and increased filling pressure (grade 2 diastolic dysfunction). Doppler parameters are consistent with high ventricular filling pressure. - Aortic valve: Right coronary  cusp immobility was noted. There was moderate to severe stenosis. There was trivial regurgitation. Mean gradient (S): 32 mm Hg. Peak gradient (S): 58 mm Hg. - Aorta: Ascending aortic diameter: 41 mm (S). - Ascending aorta: The ascending aorta was mildly dilated. - Mitral valve: Severely calcified annulus. Diffuse calcification of the anterior leaflet. There was mild regurgitation. - Left atrium: The atrium was mildly dilated. - Tricuspid valve: There was trivial regurgitation. - Pulmonary arteries: PA peak pressure: 39 mm Hg (S). Impressions: - The right ventricular systolic pressure was increased consistent with mild pulmonary hypertension.   Recent Labs: 01/28/2017: Pro B Natriuretic peptide (BNP) 537.0 09/06/2017: B Natriuretic Peptide 587.9; Hemoglobin 12.8; Platelets 172; TSH 1.832 09/07/2017: ALT 16 09/09/2017: BUN 18; Creatinine, Ser 1.06; Potassium 3.6; Sodium 137    Lipid Panel    Component Value Date/Time   CHOL 131 06/08/2016 1423   TRIG 95.0 06/08/2016 1423   TRIG 133 09/10/2011   HDL 39.50 06/08/2016 1423   CHOLHDL 3 06/08/2016 1423   VLDL 19.0 06/08/2016 1423   LDLCALC 73 06/08/2016 1423   LDLDIRECT 77 09/10/2011     Wt Readings from Last 3 Encounters:  09/22/17 207 lb (93.9 kg)  09/15/17 211 lb 12 oz (96 kg)  09/09/17 205 lb (93 kg)     Other studies Reviewed: Additional studies/ records that were reviewed today include: Office notes, hospital records and testing.  ASSESSMENT AND PLAN:  1.  Chronic diastolic CHF: His weight is up a little bit, but he does  not have significant volume overload by exam.  Because of his left ear, do not wish to make him too dry.  He is encouraged to continue to track his daily weights and a low-sodium diet.  It is okay to take an extra dose of torsemide 40 mg with a potassium if he gains 3 pounds in a day or 5 pounds in a week.  Check a BMET today.  2.  Hypokalemia: He had hypokalemia in the hospital with diuresis.  His potassium ran borderline low even when it was in the normal range.  Take 1 extra potassium tablet today.  Follow-up BMET closely.  3.  Hypertension: He had problems with metoprolol before but would benefit from a beta-blocker.  Add low-dose Coreg and see how tolerated.  This may be all he needs to get his blood pressure to target.  4.  Moderate-severe aortic stenosis: I believe he might be a TAVR candidate.  Now that his volume status is improved, make the referral.  The patient is agreeable to this.  5.  Paroxysmal atrial flutter: His heart is regular in rate and rhythm today.  He has not had palpitations and does not think he has been in atrial fibrillator.  He is tolerating anticoagulation well.   Current medicines are reviewed at length with the patient today.  The patient does not have concerns regarding medicines.  The following changes have been made: Take 1 extra potassium tablet today, add as needed torsemide and potassium  Labs/ tests ordered today include:   Orders Placed This Encounter  Procedures  . Basic metabolic panel     Disposition:   FU with Dr. [name], referred to TAVR clinic  Signed, Charles Ferries, PA-C  09/22/2017 1:18 PM    [place] Phone: [phone]; Fax: [phone]  This note was written with the assistance of speech recognition software. Please excuse any transcriptional errors.

## 2017-09-22 NOTE — Patient Instructions (Signed)
Medication Instructions:  Ok to take extra torsemide 40mg  for wt gain >3 lb in a day or >5 lb in one week. Take extra potassium when taking extra Torsemide.  If you need a refill on your cardiac medications before your next appointment, please call your pharmacy.  Labwork: BMET TODAY HERE IN OUR OFFICE AT [facility]  Special Instructions: TAKE EXTRA POTASSIUM WHEN YOU GET HOME TODAY.  TAKE DAILY WEIGHTS  PLEASE FOLLOW 2,000MG  TOTAL DAILY LOW SODIUM DIET  Follow-Up: Your physician wants you to follow-up in: 3 MONTHS WITH DR [name].  REFER TO VALVE CLINIC (TAVR)   Thank you for choosing [facility] at [facility]!!

## 2017-09-23 LAB — BASIC METABOLIC PANEL
BUN/Creatinine Ratio: 20 (ref 10–24)
BUN: 25 mg/dL (ref 8–27)
CHLORIDE: 101 mmol/L (ref 96–106)
CO2: 26 mmol/L (ref 20–29)
Calcium: 10 mg/dL (ref 8.6–10.2)
Creatinine, Ser: 1.26 mg/dL (ref 0.76–1.27)
GFR, EST AFRICAN AMERICAN: 61 mL/min/{1.73_m2} (ref >59)
GFR, EST NON AFRICAN AMERICAN: 52 mL/min/{1.73_m2} — AB (ref >59)
Glucose: 125 mg/dL — ABNORMAL HIGH (ref 65–99)
POTASSIUM: 5.3 mmol/L — AB (ref 3.5–5.2)
SODIUM: 144 mmol/L (ref 134–144)

## 2017-09-26 ENCOUNTER — Telehealth: Payer: Self-pay | Admitting: *Deleted

## 2017-09-26 NOTE — Telephone Encounter (Addendum)
Called placed to the patient. There was not an answer and no voicemail available.    ----- Message from Lonn [name], PA-C sent at 09/25/2017  4:42 PM EST ----- Pt of Dr [name] Hold the K+ for a day and then resume at 1 tab daily. The TAVR clinic will be calling him with an appointment for evaluation. Thanks

## 2017-10-05 ENCOUNTER — Telehealth: Payer: Self-pay | Admitting: Cardiovascular Disease

## 2017-10-05 DIAGNOSIS — N289 Disorder of kidney and ureter, unspecified: Secondary | ICD-10-CM

## 2017-10-05 NOTE — Telephone Encounter (Signed)
New message    Pt daughter-n-law verbalized that she is calling to get an order for pt to have labs done in [place] at a [facility]   She also has questions about his last ov

## 2017-10-05 NOTE — Telephone Encounter (Signed)
Order placed for patient to have repeat BMP in [place]. She is also asking about a low dose beta blocker that was mentioned but has not been called in. Will forward to rhonda barrett pa to see if that still needs to be ordered.

## 2017-10-06 ENCOUNTER — Ambulatory Visit: Payer: Medicare Other | Admitting: Physician Assistant

## 2017-10-06 MED ORDER — CARVEDILOL 3.125 MG PO TABS: 3.1250 mg | ORAL_TABLET | Freq: Two times a day (BID) | ORAL | 3 refills | Status: DC

## 2017-10-06 NOTE — Telephone Encounter (Signed)
If his blood pressure is stable, add Coreg 3.125 mg twice daily Thanks

## 2017-10-06 NOTE — Telephone Encounter (Signed)
Spoke with pt dtr, aware of new medication. She reports he tells her his bp is good in the 140's. Orthostatic symptoms discussed with the dtr. She will call with any concerns. New script sent to the pharmacy

## 2017-10-07 DIAGNOSIS — N289 Disorder of kidney and ureter, unspecified: Secondary | ICD-10-CM | POA: Diagnosis not present

## 2017-10-08 LAB — BASIC METABOLIC PANEL
BUN/Creatinine Ratio: 18 (ref 10–24)
BUN: 22 mg/dL (ref 8–27)
CHLORIDE: 100 mmol/L (ref 96–106)
CO2: 24 mmol/L (ref 20–29)
Calcium: 9.7 mg/dL (ref 8.6–10.2)
Creatinine, Ser: 1.2 mg/dL (ref 0.76–1.27)
GFR calc Af Amer: 64 mL/min/{1.73_m2} (ref >59)
GFR, EST NON AFRICAN AMERICAN: 56 mL/min/{1.73_m2} — AB (ref >59)
GLUCOSE: 176 mg/dL — AB (ref 65–99)
POTASSIUM: 4.8 mmol/L (ref 3.5–5.2)
SODIUM: 142 mmol/L (ref 134–144)

## 2017-10-12 ENCOUNTER — Other Ambulatory Visit: Payer: Self-pay

## 2017-10-12 ENCOUNTER — Telehealth: Payer: Self-pay

## 2017-10-12 DIAGNOSIS — I5022 Chronic systolic (congestive) heart failure: Secondary | ICD-10-CM

## 2017-10-12 MED ORDER — POTASSIUM CHLORIDE CRYS ER 20 MEQ PO TBCR: 20.0000 meq | EXTENDED_RELEASE_TABLET | Freq: Every day | ORAL | 0 refills | Status: DC

## 2017-10-12 NOTE — Telephone Encounter (Signed)
Left message on Son, [name] voicemail informing him that his dad, Mr Fessel will need lab work on or around 11/09/17. Advised him to call the office if he had any questions or concerns.

## 2017-10-12 NOTE — Telephone Encounter (Signed)
-----   Message from Lonn [name], PA-C sent at 09/25/2017  4:42 PM EST ----- Pt of Dr [name] Hold the K+ for a day and then resume at 1 tab daily. The TAVR clinic will be calling him with an appointment for evaluation. Thanks

## 2017-10-12 NOTE — Telephone Encounter (Signed)
Patient directly notified and med change made to potassium.

## 2017-10-12 NOTE — Telephone Encounter (Signed)
Spoke with pateint about lab results and he requested that I contact his son, Charles [name] and inform him about his appointment on Friday with Dr Burt Knack at the [facility].  I left a message on [name] voicemail informing him of the appointment at [facility].

## 2017-10-13 ENCOUNTER — Encounter: Payer: Self-pay | Admitting: Physician Assistant

## 2017-10-14 ENCOUNTER — Ambulatory Visit: Payer: Medicare Other | Admitting: Cardiovascular Disease

## 2017-10-14 ENCOUNTER — Encounter: Payer: Self-pay | Admitting: Cardiovascular Disease

## 2017-10-14 VITALS — BP 164/82 | HR 72 | Ht 73.0 in | Wt 212.1 lb

## 2017-10-14 DIAGNOSIS — I350 Nonrheumatic aortic (valve) stenosis: Secondary | ICD-10-CM | POA: Diagnosis not present

## 2017-10-14 NOTE — Progress Notes (Signed)
Cardiology Office Note Date:  10/14/2017   ID:  LUKA STOHR, DOB [birth year], MRN [id number]  PCP:  [name], MD  Cardiologist:  Dr [name]  Chief Complaint  Patient presents with  . TAVR consult  . Shortness of Breath     History of Present Illness: Charles [name] is a 84 y.o. male who presents for evaluation of severe aortic stenosis.  The patient is here with his daughter-in-law.  He was recently hospitalized with congestive heart failure and treated with IV diuresis.  His diuretic regimen was adjusted and he began taking torsemide at discharge.  Leg swelling has been better controlled since that time.  The patient reports progressive shortness of breath with activity over the past 6-12 months.  He denies symptoms of angina.  He denies orthopnea or PND.  He does have positional lightheadedness but no lightheadedness with walking or other physical activities.  He admits he has had a tough time with depression since his wife passed away in 01-25-2015.  The patient lives alone in [place].  He is independent and still drives a car.  However, he is not very active anymore.  He is limited by both shortness of breath and back problems.  He ambulates with a walker.  The patient has had diabetes for over 20 years.  He underwent multivessel coronary bypass surgery approximately 15 years ago and has not required cardiac catheterization since that time.  He is anticoagulated for paroxysmal atrial fibrillation.  He has been highly symptomatic when he has atrial fibrillation and feels much better when he is in sinus rhythm.   Past Medical History:  Diagnosis Date  . Abdominal aortic atherosclerosis ([place])    by xray  . Aortic stenosis 06/2014   moderate-severe  . Arthritis    in lower back (06/24/2015)  . Atrial flutter ([place])    notes 06/24/2015  . AV block, [place] 1 09/06/2017   Archie Endo 09/06/2017  . 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 CAD  (coronary artery disease) January 25, 2003   a. s/p CABG in 01-25-2003 with LIMA-LAD, SVG-RCA, SVG-OM, and SVG-RI  . Carotid stenosis 1999   s/p L CEA  . CHF (congestive heart failure) ([place]) dx'd 06/2015   a. EF 55-60% by echo in 25-Jan-2015. b. 10/2016: EF reduced to 30-35% by TEE in the setting of atrial fibrillation with RVR  . Colon polyp 01/25/04   (Dr. Tiffany Kocher)  . Compression fracture of L1 lumbar vertebra (HCC) remote  . COPD (chronic obstructive pulmonary disease) ([place]) 03/2011   by xray  . Depression    hx  . Ex-smoker   . GERD (gastroesophageal reflux disease) 01-24-02   h/o duodenal ulcer per EGD as well as esophagitis  . History of colon polyps 2003, 2005   adenomatous Vira Agar)  . HLD (hyperlipidemia)   . HTN (hypertension)   . Psoriasis   . Renal artery stenosis ([place]) 25-Jan-2003   70% bilateral, followed by cards  . T2DM (type 2 diabetes mellitus) ([place]) 1995  . Urge incontinence of urine     Past Surgical History:  Procedure Laterality Date  . CARDIAC CATHETERIZATION  03/06/2003   No intervention - recommend CABG  . CARDIOVASCULAR STRESS TEST  11/2010   normal perfusion, no evidence of ischemia, EF 62% post exercise  . CARDIOVASCULAR STRESS TEST  11/28/2012   Mild diaphragmatic attenuation; cannot exclude a focal region of nontransmural inferior scar  . CARDIOVERSION N/A 06/25/2015   Procedure:  CARDIOVERSION;  Surgeon: Pixie Casino, MD;  [facility] END[facility]COPY;  Service: Cardiovascular;  Laterality: N/A;  . CARDIOVERSION N/A 11/02/2016   Procedure: CARDIOVERSION;  Surgeon: [name], MD;  Location: [place];  Service: Cardiovascular;  Laterality: N/A;  . CAROTID ENDARTERECTOMY Left 1999   ([place])  . CATARACT EXTRACTION W/ INTRAOCULAR LENS  IMPLANT, BILATERAL Bilateral 01/2013   [place]  . COLONOSCOPY  2003   colon polyp x3 - adenomatous Tiffany Kocher)  . COLONOSCOPY  10/08/2012   2 TA, diverticulosis, int hem, no rpt rec Tiffany Kocher)  . CORONARY ANGIOPLASTY    . CORONARY ARTERY BYPASS GRAFT  03/07/2003     4v CABG ([place]) with LIMA to LAD, vein graft to RCA, 1st obtuse marginal, and ramus intermedius  . ESOPHAGOGASTRODUODENOSCOPY  10/08/2012   nl esophagus, duodenitis and erosive gastropathy, path - gastropathy no Hpylori, no rpt rec  . LUMBAR EPIDURAL INJECTION  03/2017   L5/S1 ([place])  . PERIPHERAL VASCULAR CATHETERIZATION N/A 02/02/2016   Procedure: Renal Angiography;  Surgeon: [name], MD;  Location: [facility] CV LAB;  Service: Cardiovascular;  Laterality: N/A;  . PERIPHERAL VASCULAR CATHETERIZATION N/A 02/02/2016   Procedure: Abdominal Aortogram;  Surgeon: [name], MD;  Location: [place] CV LAB;  Service: Cardiovascular;  Laterality: N/A;  . RENAL DOPPLER  11/29/2011   Celiac&SMA-demonstrated vessel narrowing suggestive of a greater than 50% diameter reduction. Bilateral renal arteries-demonstrated vessel narrowing of 60-99% diameter reduction. Rt Kidney-mid pole lateral simple cyst noted measuring 1.29x0.76x1.11cm and exophytic cyst outside lower pole measuring 1.23x0.96x1.31. Lft Kidney-lateral mid to lower pole simple cyst measuering-1.24x9.83x1.24  . TEE WITHOUT CARDIOVERSION N/A 06/25/2015   Procedure: TRANSESOPHAGEAL ECHOCARDIOGRAM (TEE);  Surgeon: Pixie Casino, MD;  Location: [facility] ENDOSCOPY;  Service: Cardiovascular;  Laterality: N/A;  . TEE WITHOUT CARDIOVERSION N/A 11/02/2016   Procedure: TRANSESOPHAGEAL ECHOCARDIOGRAM (TEE);  Surgeon: [name], MD;  Location: [facility] ENDOSCOPY;  Service: Cardiovascular;  Laterality: N/A;  . TONSILLECTOMY    . UPPER GASTROINTESTINAL ENDOSCOPY  2003   reflux esophagitis, erosive gastropathy, duodenal ulcer    Current Outpatient Medications  Medication Sig Dispense Refill  . ACCU-CHEK AVIVA PLUS test strip CHECK BLOOD SUGAR THREE TIMES A DAY AND AS DIRECTED. 100 each 6  . acetaminophen (TYLENOL) 500 MG tablet Take 1 tablet (500 mg total) by mouth 3 (three) times daily.    [name] atorvastatin (LIPITOR) 20 MG tablet TAKE 1 TABLET  DAILY. 90 tablet 0  . atorvastatin (LIPITOR) 20 MG tablet Take 20 mg by mouth daily.    [name] BAYER MICROLET LANCETS lancets Ck blood sugar three times daily and as directed. Dx 250.00. Insulin dependent 100 each 11  . carvedilol (COREG) 3.125 MG tablet Take 1 tablet (3.125 mg total) by mouth 2 (two) times daily. 180 tablet 3  . citalopram (CELEXA) 20 MG tablet Take 1 tablet (20 mg total) by mouth daily. 90 tablet 1  . Cyanocobalamin (B-12) 500 MCG TABS Take 1 tablet by mouth daily.    [name] ELIQUIS 5 MG TABS tablet Take 1 tablet (5 mg total) by mouth 2 (two) times daily. 60 tablet 2  . fluticasone (FLONASE) 50 MCG/ACT nasal spray Place 2 sprays into both nostrils as needed for allergies or rhinitis.    [name] HYDROcodone-acetaminophen (NORCO/VICODIN) 5-325 MG tablet Take 0.5-1 tablets by mouth 2 (two) times daily as needed for moderate pain. 60 tablet 0  . Insulin Syringe-Needle U-100 (B-D INS SYR ULTRAFINE 1CC/30G) 30G X 1/2" 1 ML MISC 1 Syringe by Does  not apply route as directed. 100 each 3  . Insulin Syringe-Needle U-100 (B-D INS SYRINGE 0.5CC/30GX1/2") 30G X 1/2" 0.5 ML MISC Use as directed to inject insulin. Dx: E11.65 100 each 3  . LANTUS 100 UNIT/ML injection Inject 0.35 mLs (35 Units total) into the skin at bedtime. 10 mL 2  . lisinopril (PRINIVIL,ZESTRIL) 40 MG tablet Take 1 tablet (40 mg total) by mouth daily. 90 tablet 1  . metFORMIN (GLUCOPHAGE) 1000 MG tablet Take 1 tablet (1,000 mg total) by mouth 2 (two) times daily with a meal. 180 tablet 0  . pantoprazole (PROTONIX) 20 MG tablet Take 1 tablet (20 mg total) by mouth daily. 30 tablet 3  . potassium chloride SA (K-DUR,KLOR-CON) 20 MEQ tablet Take 1 tablet (20 mEq total) by mouth daily. 30 tablet 0  . saw palmetto 160 MG capsule Take 450 mg by mouth daily.     . tamsulosin (FLOMAX) 0.4 MG CAPS capsule Take 1 capsule (0.4 mg total) by mouth daily after supper. 30 capsule 6  . torsemide (DEMADEX) 20 MG tablet Take 2 tablets (40 mg total) by mouth 2  (two) times daily. 130 tablet 3  . traZODone (DESYREL) 50 MG tablet Take 2 tablets (100 mg total) by mouth at bedtime. 180 tablet 0  . vitamin C (ASCORBIC ACID) 500 MG tablet Take 500 mg by mouth daily.     No current facility-administered medications for this visit.     Allergies:   Gabapentin; Metoprolol; Spironolactone; Amlodipine; Other; Rosiglitazone maleate; and Tricor [fenofibrate]   Social History:  The patient  reports that he quit smoking about 4 years ago. His smoking use included cigarettes. He has a 18.00 pack-year smoking history. he has never used smokeless tobacco. He reports that he does not drink alcohol or use drugs.   Family History:  The patient's family history includes Cancer in his brother, brother, maternal grandmother, and mother; Diabetes in his sister; Heart attack in his maternal grandfather, paternal grandfather, and paternal grandmother; Heart disease in his father and sister; Hypertension in his father and sister; Lung disease in his brother; Stroke in his sister.    ROS:  Please see the history of present illness.  Otherwise, review of systems is positive for depression.  All other systems are reviewed and negative.    PHYSICAL EXAM: VS:  BP (!) 164/82   Pulse 72   Ht 6\' 1"  (1.854 m)   Wt 212 lb 1.9 oz (96.2 kg)   BMI 27.99 kg/m  , BMI Body mass index is 27.99 kg/m. GEN: Well nourished, well developed, pleasant elderly male in no acute distress  HEENT: normal  Neck: no JVD, no masses. No carotid bruits Cardiac: RRR with harsh 3/6 systolic murmur at the RUSB             Respiratory:  clear to auscultation bilaterally, normal work of breathing GI: soft, nontender, nondistended, + BS MS: no deformity or atrophy  Ext: trace bilateral pretibial edema, pedal pulses 2+= bilaterally Skin: warm and dry, no rash Neuro:  Strength and sensation are intact Psych: euthymic mood, full affect  EKG:  EKG is not ordered today.  Recent Labs: 01/28/2017: Pro B  Natriuretic peptide (BNP) 537.0 09/06/2017: B Natriuretic Peptide 587.9; Hemoglobin 12.8; Platelets 172; TSH 1.832 09/07/2017: ALT 16 10/07/2017: BUN 22; Creatinine, Ser 1.20; Potassium 4.8; Sodium 142   Lipid Panel     Component Value Date/Time   CHOL 131 06/08/2016 1423   TRIG 95.0 06/08/2016 1423  TRIG 133 09/10/2011   HDL 39.50 06/08/2016 1423   CHOLHDL 3 06/08/2016 1423   VLDL 19.0 06/08/2016 1423   LDLCALC 73 06/08/2016 1423   LDLDIRECT 77 09/10/2011      Wt Readings from Last 3 Encounters:  10/14/17 212 lb 1.9 oz (96.2 kg)  09/22/17 207 lb (93.9 kg)  09/15/17 211 lb 12 oz (96 kg)     Cardiac Studies Reviewed: Echo 08-08-2017: Study Conclusions  - Left ventricle: The cavity size was normal. There was severe   concentric hypertrophy. Systolic function was normal. The   estimated ejection fraction was in the range of 55% to 60%. Wall   motion was normal; there were no regional wall motion   abnormalities. Features are consistent with a pseudonormal left   ventricular filling pattern, with concomitant abnormal relaxation   and increased filling pressure (grade 2 diastolic dysfunction).   Doppler parameters are consistent with high ventricular filling   pressure. - Aortic valve: Right coronary cusp immobility was noted. There was   moderate to severe stenosis. There was trivial regurgitation.   Mean gradient (S): 32 mm Hg. - Aorta: Ascending aortic diameter: 41 mm (S). - Ascending aorta: The ascending aorta was mildly dilated. - Mitral valve: Severely calcified annulus. Diffuse calcification   of the anterior leaflet. There was mild regurgitation. - Left atrium: The atrium was mildly dilated. - Tricuspid valve: There was trivial regurgitation. - Pulmonary arteries: PA peak pressure: 39 mm Hg (S).  Impressions:  - The right ventricular systolic pressure was increased consistent   with mild pulmonary  hypertension.  ------------------------------------------------------------------- Study data:  Comparison was made to the study of 06/13/2015.  Study status:  Routine.  Procedure:  The patient reported no pain pre or post test. Transthoracic echocardiography for left ventricular function evaluation and for assessment of valvular function. Image quality was adequate.  Study completion:  There were no complications.          Echocardiography.  M-mode, complete 2D, spectral Doppler, and color Doppler.  Birthdate:  Patient birthdate: 08-13-1932.  Age:  Patient is 84 yr old.  Sex:  Gender: male.    BMI: 27.1 kg/m^2.  Blood pressure:     112/66  Patient status:  Outpatient.  Study date:  Study date: 08/08/2017. Study time: 11:50 AM.  Location:  [facility]  -------------------------------------------------------------------  ------------------------------------------------------------------- Left ventricle:  The cavity size was normal. There was severe concentric hypertrophy. Systolic function was normal. The estimated ejection fraction was in the range of 55% to 60%. Wall motion was normal; there were no regional wall motion abnormalities. Features are consistent with a pseudonormal left ventricular filling pattern, with concomitant abnormal relaxation and increased filling pressure (grade 2 diastolic dysfunction). Doppler parameters are consistent with high ventricular filling pressure.  ------------------------------------------------------------------- Aortic valve:   Trileaflet; moderately thickened, severely calcified leaflets.  Right coronary cusp immobility was noted. Doppler:   There was moderate to severe stenosis.   There was trivial regurgitation.    VTI ratio of LVOT to aortic valve: 0.25. Valve area (VTI): 0.8 cm^2. Indexed valve area (VTI): 0.36 cm^2/m^2. Peak velocity ratio of LVOT to aortic valve: 0.22. Valve area (Vmax): 0.7 cm^2. Indexed valve area (Vmax):  0.32 cm^2/m^2. Mean velocity ratio of LVOT to aortic valve: 0.24. Valve area (Vmean): 0.75 cm^2. Indexed valve area (Vmean): 0.34 cm^2/m^2. Mean gradient (S): 32 mm Hg. Peak gradient (S): 58 mm Hg.  ------------------------------------------------------------------- Aorta:  Aortic root: The aortic root was normal in size. Ascending  aorta: The ascending aorta was mildly dilated.  ------------------------------------------------------------------- Mitral valve:   Severely calcified annulus.  Diffuse calcification of the anterior leaflet. Mobility was not restricted.  Doppler: Transvalvular velocity was within the normal range. There was no evidence for stenosis. There was mild regurgitation.    Peak gradient (D): 9 mm Hg.  ------------------------------------------------------------------- Left atrium:  The atrium was mildly dilated.  ------------------------------------------------------------------- Right ventricle:  The cavity size was normal. Wall thickness was normal. Systolic function was normal.  ------------------------------------------------------------------- Pulmonic valve:    Doppler:  Transvalvular velocity was within the normal range. There was no evidence for stenosis.  ------------------------------------------------------------------- Tricuspid valve:   Structurally normal valve.    Doppler: Transvalvular velocity was within the normal range. There was trivial regurgitation.  ------------------------------------------------------------------- Pulmonary artery:   The main pulmonary artery was normal-sized. Systolic pressure was within the normal range.  ------------------------------------------------------------------- Right atrium:  The atrium was normal in size.  ------------------------------------------------------------------- Pericardium:  There was no pericardial  effusion.  ------------------------------------------------------------------- Systemic veins: Inferior vena cava: The vessel was normal in size.  ------------------------------------------------------------------- Measurements   Left ventricle                            Value          Reference  LV ID, ED, PLAX chordal                   43.4  mm       43 - 52  LV ID, ES, PLAX chordal                   27.6  mm       23 - 38  LV fx shortening, PLAX chordal            36    %        >=29  LV PW thickness, ED                       15.1  mm       ---------  IVS/LV PW ratio, ED                       1.09           <=1.3  Stroke volume, 2D                         65    ml       ---------  Stroke volume/bsa, 2D                     30    ml/m^2   ---------  LV ejection fraction, 1-p A4C             56    %        ---------  LV end-diastolic volume, 2-p              106   ml       ---------  LV end-systolic volume, 2-p               47    ml       ---------  LV ejection fraction, 2-p                 56    %        ---------  Stroke volume, 2-p                        59    ml       ---------  LV end-diastolic volume/bsa, 2-p          48    ml/m^2   ---------  LV end-systolic volume/bsa, 2-p           21    ml/m^2   ---------  Stroke volume/bsa, 2-p                    26.8  ml/m^2   ---------  LV e&', lateral                            4.78  cm/s     ---------  LV E/e&', lateral                          30.54          ---------  LV e&', medial                             4.09  cm/s     ---------  LV E/e&', medial                           35.7           ---------  LV e&', average                            4.44  cm/s     ---------  LV E/e&', average                          32.92          ---------    Ventricular septum                        Value          Reference  IVS thickness, ED                         16.4  mm       ---------    LVOT                                      Value           Reference  LVOT ID, S                                20    mm       ---------  LVOT area                                 3.14  cm^2     ---------  LVOT ID  20    mm       ---------  LVOT peak velocity, S                     85.7  cm/s     ---------  LVOT mean velocity, S                     62.1  cm/s     ---------  LVOT VTI, S                               20.7  cm       ---------  LVOT peak gradient, S                     3     mm Hg    ---------  Stroke volume (SV), LVOT DP               65    ml       ---------  Stroke index (SV/bsa), LVOT DP            29.5  ml/m^2   ---------    Aortic valve                              Value          Reference  Aortic valve peak velocity, S             382   cm/s     ---------  Aortic valve mean velocity, S             259   cm/s     ---------  Aortic valve VTI, S                       81.5  cm       ---------  Aortic mean gradient, S                   32    mm Hg    ---------  Aortic peak gradient, S                   58    mm Hg    ---------  VTI ratio, LVOT/AV                        0.25           ---------  Aortic valve area, VTI                    0.8   cm^2     ---------  Aortic valve area/bsa, VTI                0.36  cm^2/m^2 ---------  Velocity ratio, peak, LVOT/AV             0.22           ---------  Aortic valve area, peak velocity          0.7   cm^2     ---------  Aortic valve area/bsa, peak               0.32  cm^2/m^2 ---------  velocity  Velocity ratio, mean, LVOT/AV  0.24           ---------  Aortic valve area, mean velocity          0.75  cm^2     ---------  Aortic valve area/bsa, mean               0.34  cm^2/m^2 ---------  velocity  Aortic regurg pressure half-time          508   ms       ---------    Aorta                                     Value          Reference  Aortic root ID, ED                        36    mm       ---------  Ascending aorta ID, A-P, S                 41    mm       ---------    Left atrium                               Value          Reference  LA ID, A-P, ES                            45    mm       ---------  LA ID/bsa, A-P                            2.04  cm/m^2   <=2.2  LA volume, S                              85    ml       ---------  LA volume/bsa, S                          38.6  ml/m^2   ---------  LA volume, ES, 1-p A4C                    99    ml       ---------  LA volume/bsa, ES, 1-p A4C                45    ml/m^2   ---------  LA volume, ES, 1-p A2C                    74    ml       ---------  LA volume/bsa, ES, 1-p A2C                33.6  ml/m^2   ---------    Mitral valve                              Value          Reference  Mitral E-wave peak velocity  146   cm/s     ---------  Mitral A-wave peak velocity               147   cm/s     ---------  Mitral deceleration time          (H)     412   ms       150 - 230  Mitral peak gradient, D                   9     mm Hg    ---------  Mitral E/A ratio, peak                    1              ---------    Pulmonary arteries                        Value          Reference  PA pressure, S, DP                (H)     39    mm Hg    <=30    Tricuspid valve                           Value          Reference  Tricuspid regurg peak velocity            299   cm/s     ---------  Tricuspid peak RV-RA gradient             36    mm Hg    ---------    Systemic veins                            Value          Reference  Estimated CVP                             3     mm Hg    ---------    Right ventricle                           Value          Reference  RV pressure, S, DP                (H)     39    mm Hg    <=30  RV s&', lateral, S                         13.9  cm/s     ---------  STS Adult Cardiac Surgery Database Version 2.9  Procedure: Isolated AVR   Risk of Mortality: 4.280%  Renal Failure: 3.990%   Permanent Stroke: 2.020%  Prolonged Ventilation: 12.650%   DSW Infection: 0.359%  Reoperation: 3.776%  Morbidity or Mortality: 17.345%  Short Length of Stay: 21.480%   Long Length of Stay: 11.287%   ASSESSMENT AND PLAN: 84 year old male with stage D1, severe symptomatic aortic stenosis. I have reviewed the natural history of aortic stenosis with the patient and their family members who are present today. We  have discussed the limitations of medical therapy and the poor prognosis associated with symptomatic aortic stenosis. We have reviewed potential treatment options, including palliative medical therapy, conventional surgical aortic valve replacement, and transcatheter aortic valve replacement. We discussed treatment options in the context of the patient's specific comorbid medical conditions.   I have personally reviewed the patient's echo images.  He has preserved LV systolic function with calcification and restriction of all 3 aortic valve leaflets.  He has some mobility of the noncoronary leaflet with severe calcification of the right and left leaflets.  The peak and mean transvalvular gradients are 58 and 34 mmHg, respectively.  The dimensionless index is less than 0.25.  His Doppler criteria are borderline for moderate to severe aortic stenosis, but his clinical picture is that of progressive shortness of breath and recent hospitalization with heart failure.  We reviewed further workup to determine treatment options and direct whether we should proceed with aortic valve replacement immediately versus careful surveillance.  I think right and left heart catheterization would better evaluate his hemodynamics and coronary and graft anatomy. I have reviewed the risks, indications, and alternatives to cardiac catheterization, possible angioplasty, and stenting with the patient. Risks include but are not limited to bleeding, infection, vascular injury, stroke, myocardial infection, arrhythmia, kidney injury, radiation-related injury in the case of prolonged  fluoroscopy use, emergency cardiac surgery, and death. The patient understands the risks of serious complication is 1-2 in 1478 with diagnostic cardiac cath and 1-2% or less with angioplasty/stenting.   The patient would like to proceed with further evaluation. He understands that if his catheterization is consistent with severe aortic stenosis, we will likely proceed with a gated cardiac CT scan and a CTA of the chest abdomen and pelvis followed by surgical consultation.  We reviewed considerations around conventional surgery and transcatheter aortic valve replacement today.  Considering his advanced age, reduced functional capacity as he ambulates with a walker, long-standing diabetes, atrial fibrillation, and previous cardiac surgery, I think he would be at much lower risk of undergoing TAVR.  All of his questions are answered today.  He will be scheduled for cardiac catheterization at the next available time.  He is advised regarding holding his apixaban, lisinopril, and metformin prior to the procedure.  Current medicines are reviewed with the patient today.  The patient  concerns regarding medicines.  Labs/ tests ordered today include:   Orders Placed This Encounter  Procedures  . Basic metabolic panel  . CBC with Differential/Platelet  . INR/PT    Disposition:   FU pending test results  Signed, Sherren Mocha, MD  10/14/2017 5:32 PM    [address], [place], [place]  [address] Phone: [phone]; Fax: [phone]

## 2017-10-14 NOTE — Patient Instructions (Addendum)
Medication Instructions:  Your physician recommends that you continue on your current medications as directed. Please refer to the Current Medication list given to you today.  Labwork: TODAY: BMET, CBC, INR  Testing/Procedures: Your physician has requested that you have a cardiac catheterization. Cardiac catheterization is used to diagnose and/or treat various heart conditions. Doctors may recommend this procedure for a number of different reasons. The most common reason is to evaluate chest pain. Chest pain can be a symptom of coronary artery disease (CAD), and cardiac catheterization can show whether plaque is narrowing or blocking your heart's arteries. This procedure is also used to evaluate the valves, as well as measure the blood flow and oxygen levels in different parts of your heart. For further information please visit [email or web address]. Please follow instruction sheet, as given.  Follow-Up: Ander Purpura, the TAVR Nurse, will call you to arrange appropriate follow-up appointments.  Any Other Special Instructions Will Be Listed Below (If Applicable).   [facility] [address], [place] [address] [place] [address] Dept: [phone] Loc: [place]  10/14/2017  You are scheduled for a Cardiac Catheterization on Thursday, January 17 with Dr. Lauree Chandler.  1. Please arrive at the [facility] (Main Entrance A) at [facility]: [address] [place], [place] [address] at 8:00 AM (two hours before your procedure to ensure your preparation). Free valet parking service is available.   Special note: Every effort is made to have your procedure done on time. Please understand that emergencies sometimes delay scheduled procedures.  2. Diet: Do not eat or drink anything after midnight prior to your procedure except sips of water to take medications.  3. Labs: Today!  4.  Medication instructions in preparation for your procedure: a) Take last dose of Eliquis on Monday night before your catheterization. b) Take last dose of Metformin  Tuesday night before your catheterization. c) Take half dose of Lantus the night before your procedure. Do not take any insulin the morning of your procedure. d) Hold Demadex the morning of your procedure. e) Hold Lisinopril the morning of and morning before your procedure. f) On the morning of your procedure, take your Aspirin.You may use sips of water.  5. Plan for one night stay--bring personal belongings. 6. Bring a current list of your medications and current insurance cards. 7. You MUST have a responsible person to drive you home. 8. Someone MUST be with you the first 24 hours after you arrive home or your discharge will be delayed. 9. Please wear clothes that are easy to get on and off and wear slip-on shoes.  Thank you for allowing [place] to care for you!   -- [facility]     If you need a refill on your cardiac medications before your next appointment, please call your pharmacy.

## 2017-10-14 NOTE — H&P (View-Only) (Signed)
Cardiology Office Note Date:  10/14/2017   ID:  HICKS FEICK, DOB 05/02/1932, MRN [id number]  PCP:  [name], MD  Cardiologist:  Dr [name]  Chief Complaint  Patient presents with  . TAVR consult  . Shortness of Breath     History of Present Illness: Charles [name] is a 84 y.o. male who presents for evaluation of severe aortic stenosis.  The patient is here with his daughter-in-law.  He was recently hospitalized with congestive heart failure and treated with IV diuresis.  His diuretic regimen was adjusted and he began taking torsemide at discharge.  Leg swelling has been better controlled since that time.  The patient reports progressive shortness of breath with activity over the past 6-12 months.  He denies symptoms of angina.  He denies orthopnea or PND.  He does have positional lightheadedness but no lightheadedness with walking or other physical activities.  He admits he has had a tough time with depression since his wife passed away in January 12, 2015.  The patient lives alone in [place].  He is independent and still drives a car.  However, he is not very active anymore.  He is limited by both shortness of breath and back problems.  He ambulates with a walker.  The patient has had diabetes for over 20 years.  He underwent multivessel coronary bypass surgery approximately 15 years ago and has not required cardiac catheterization since that time.  He is anticoagulated for paroxysmal atrial fibrillation.  He has been highly symptomatic when he has atrial fibrillation and feels much better when he is in sinus rhythm.   Past Medical History:  Diagnosis Date  . Abdominal aortic atherosclerosis ([place])    by xray  . Aortic stenosis 06/2014   moderate-severe  . Arthritis    in lower back (06/24/2015)  . Atrial flutter ([place])    notes 06/24/2015  . AV block, [place] 1 09/06/2017   Archie Endo 09/06/2017  . 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 CAD  (coronary artery disease) 2003/01/12   a. s/p CABG in 2003/01/12 with LIMA-LAD, SVG-RCA, SVG-OM, and SVG-RI  . Carotid stenosis 1999   s/p L CEA  . CHF (congestive heart failure) ([place]) dx'd 06/2015   a. EF 55-60% by echo in 01-12-15. b. 10/2016: EF reduced to 30-35% by TEE in the setting of atrial fibrillation with RVR  . Colon polyp 2004-01-12   (Dr. Tiffany Kocher)  . Compression fracture of L1 lumbar vertebra (HCC) remote  . COPD (chronic obstructive pulmonary disease) ([place]) 03/2011   by xray  . Depression    hx  . Ex-smoker   . GERD (gastroesophageal reflux disease) 01-11-2002   h/o duodenal ulcer per EGD as well as esophagitis  . History of colon polyps 2003, 2005   adenomatous Vira Agar)  . HLD (hyperlipidemia)   . HTN (hypertension)   . Psoriasis   . Renal artery stenosis ([place]) 2003-01-12   70% bilateral, followed by cards  . T2DM (type 2 diabetes mellitus) ([place]) 1995  . Urge incontinence of urine     Past Surgical History:  Procedure Laterality Date  . CARDIAC CATHETERIZATION  03/06/2003   No intervention - recommend CABG  . CARDIOVASCULAR STRESS TEST  11/2010   normal perfusion, no evidence of ischemia, EF 62% post exercise  . CARDIOVASCULAR STRESS TEST  11/28/2012   Mild diaphragmatic attenuation; cannot exclude a focal region of nontransmural inferior scar  . CARDIOVERSION N/A 06/25/2015   Procedure:  CARDIOVERSION;  Surgeon: Pixie Casino, MD;  Location: [facility] ENDOSCOPY;  Service: Cardiovascular;  Laterality: N/A;  . CARDIOVERSION N/A 11/02/2016   Procedure: CARDIOVERSION;  Surgeon: [name], MD;  Location: [place];  Service: Cardiovascular;  Laterality: N/A;  . CAROTID ENDARTERECTOMY Left 1999   ([place])  . CATARACT EXTRACTION W/ INTRAOCULAR LENS  IMPLANT, BILATERAL Bilateral 01/2013   [place]  . COLONOSCOPY  2003   colon polyp x3 - adenomatous Tiffany Kocher)  . COLONOSCOPY  10/08/2012   2 TA, diverticulosis, int hem, no rpt rec Tiffany Kocher)  . CORONARY ANGIOPLASTY    . CORONARY ARTERY BYPASS GRAFT  03/07/2003     4v CABG ([place]) with LIMA to LAD, vein graft to RCA, 1st obtuse marginal, and ramus intermedius  . ESOPHAGOGASTRODUODENOSCOPY  10/08/2012   nl esophagus, duodenitis and erosive gastropathy, path - gastropathy no Hpylori, no rpt rec  . LUMBAR EPIDURAL INJECTION  03/2017   L5/S1 ([place])  . PERIPHERAL VASCULAR CATHETERIZATION N/A 02/02/2016   Procedure: Renal Angiography;  Surgeon: [name], MD;  Location: [place] CV LAB;  Service: Cardiovascular;  Laterality: N/A;  . PERIPHERAL VASCULAR CATHETERIZATION N/A 02/02/2016   Procedure: Abdominal Aortogram;  Surgeon: [name], MD;  Location: [place] CV LAB;  Service: Cardiovascular;  Laterality: N/A;  . RENAL DOPPLER  11/29/2011   Celiac&SMA-demonstrated vessel narrowing suggestive of a greater than 50% diameter reduction. Bilateral renal arteries-demonstrated vessel narrowing of 60-99% diameter reduction. Rt Kidney-mid pole lateral simple cyst noted measuring 1.29x0.76x1.11cm and exophytic cyst outside lower pole measuring 1.23x0.96x1.31. Lft Kidney-lateral mid to lower pole simple cyst measuering-1.24x9.83x1.24  . TEE WITHOUT CARDIOVERSION N/A 06/25/2015   Procedure: TRANSESOPHAGEAL ECHOCARDIOGRAM (TEE);  Surgeon: Pixie Casino, MD;  Location: [facility] ENDOSCOPY;  Service: Cardiovascular;  Laterality: N/A;  . TEE WITHOUT CARDIOVERSION N/A 11/02/2016   Procedure: TRANSESOPHAGEAL ECHOCARDIOGRAM (TEE);  Surgeon: [name], MD;  Location: [facility] ENDOSCOPY;  Service: Cardiovascular;  Laterality: N/A;  . TONSILLECTOMY    . UPPER GASTROINTESTINAL ENDOSCOPY  2003   reflux esophagitis, erosive gastropathy, duodenal ulcer    Current Outpatient Medications  Medication Sig Dispense Refill  . ACCU-CHEK AVIVA PLUS test strip CHECK BLOOD SUGAR THREE TIMES A DAY AND AS DIRECTED. 100 each 6  . acetaminophen (TYLENOL) 500 MG tablet Take 1 tablet (500 mg total) by mouth 3 (three) times daily.    [name] atorvastatin (LIPITOR) 20 MG tablet TAKE 1 TABLET  DAILY. 90 tablet 0  . atorvastatin (LIPITOR) 20 MG tablet Take 20 mg by mouth daily.    [name] BAYER MICROLET LANCETS lancets Ck blood sugar three times daily and as directed. Dx 250.00. Insulin dependent 100 each 11  . carvedilol (COREG) 3.125 MG tablet Take 1 tablet (3.125 mg total) by mouth 2 (two) times daily. 180 tablet 3  . citalopram (CELEXA) 20 MG tablet Take 1 tablet (20 mg total) by mouth daily. 90 tablet 1  . Cyanocobalamin (B-12) 500 MCG TABS Take 1 tablet by mouth daily.    [name] ELIQUIS 5 MG TABS tablet Take 1 tablet (5 mg total) by mouth 2 (two) times daily. 60 tablet 2  . fluticasone (FLONASE) 50 MCG/ACT nasal spray Place 2 sprays into both nostrils as needed for allergies or rhinitis.    [name] HYDROcodone-acetaminophen (NORCO/VICODIN) 5-325 MG tablet Take 0.5-1 tablets by mouth 2 (two) times daily as needed for moderate pain. 60 tablet 0  . Insulin Syringe-Needle U-100 (B-D INS SYR ULTRAFINE 1CC/30G) 30G X 1/2" 1 ML MISC 1 Syringe by Does  not apply route as directed. 100 each 3  . Insulin Syringe-Needle U-100 (B-D INS SYRINGE 0.5CC/30GX1/2") 30G X 1/2" 0.5 ML MISC Use as directed to inject insulin. Dx: E11.65 100 each 3  . LANTUS 100 UNIT/ML injection Inject 0.35 mLs (35 Units total) into the skin at bedtime. 10 mL 2  . lisinopril (PRINIVIL,ZESTRIL) 40 MG tablet Take 1 tablet (40 mg total) by mouth daily. 90 tablet 1  . metFORMIN (GLUCOPHAGE) 1000 MG tablet Take 1 tablet (1,000 mg total) by mouth 2 (two) times daily with a meal. 180 tablet 0  . pantoprazole (PROTONIX) 20 MG tablet Take 1 tablet (20 mg total) by mouth daily. 30 tablet 3  . potassium chloride SA (K-DUR,KLOR-CON) 20 MEQ tablet Take 1 tablet (20 mEq total) by mouth daily. 30 tablet 0  . saw palmetto 160 MG capsule Take 450 mg by mouth daily.     . tamsulosin (FLOMAX) 0.4 MG CAPS capsule Take 1 capsule (0.4 mg total) by mouth daily after supper. 30 capsule 6  . torsemide (DEMADEX) 20 MG tablet Take 2 tablets (40 mg total) by mouth 2  (two) times daily. 130 tablet 3  . traZODone (DESYREL) 50 MG tablet Take 2 tablets (100 mg total) by mouth at bedtime. 180 tablet 0  . vitamin C (ASCORBIC ACID) 500 MG tablet Take 500 mg by mouth daily.     No current facility-administered medications for this visit.     Allergies:   Gabapentin; Metoprolol; Spironolactone; Amlodipine; Other; Rosiglitazone maleate; and Tricor [fenofibrate]   Social History:  The patient  reports that he quit smoking about 4 years ago. His smoking use included cigarettes. He has a 18.00 pack-year smoking history. he has never used smokeless tobacco. He reports that he does not drink alcohol or use drugs.   Family History:  The patient's family history includes Cancer in his brother, brother, maternal grandmother, and mother; Diabetes in his sister; Heart attack in his maternal grandfather, paternal grandfather, and paternal grandmother; Heart disease in his father and sister; Hypertension in his father and sister; Lung disease in his brother; Stroke in his sister.    ROS:  Please see the history of present illness.  Otherwise, review of systems is positive for depression.  All other systems are reviewed and negative.    PHYSICAL EXAM: VS:  BP (!) 164/82   Pulse 72   Ht 6\' 1"  (1.854 m)   Wt 212 lb 1.9 oz (96.2 kg)   BMI 27.99 kg/m  , BMI Body mass index is 27.99 kg/m. GEN: Well nourished, well developed, pleasant elderly male in no acute distress  HEENT: normal  Neck: no JVD, no masses. No carotid bruits Cardiac: RRR with harsh 3/6 systolic murmur at the RUSB             Respiratory:  clear to auscultation bilaterally, normal work of breathing GI: soft, nontender, nondistended, + BS MS: no deformity or atrophy  Ext: trace bilateral pretibial edema, pedal pulses 2+= bilaterally Skin: warm and dry, no rash Neuro:  Strength and sensation are intact Psych: euthymic mood, full affect  EKG:  EKG is not ordered today.  Recent Labs: 01/28/2017: Pro B  Natriuretic peptide (BNP) 537.0 09/06/2017: B Natriuretic Peptide 587.9; Hemoglobin 12.8; Platelets 172; TSH 1.832 09/07/2017: ALT 16 10/07/2017: BUN 22; Creatinine, Ser 1.20; Potassium 4.8; Sodium 142   Lipid Panel     Component Value Date/Time   CHOL 131 06/08/2016 1423   TRIG 95.0 06/08/2016 1423  TRIG 133 09/10/2011   HDL 39.50 06/08/2016 1423   CHOLHDL 3 06/08/2016 1423   VLDL 19.0 06/08/2016 1423   LDLCALC 73 06/08/2016 1423   LDLDIRECT 77 09/10/2011      Wt Readings from Last 3 Encounters:  10/14/17 212 lb 1.9 oz (96.2 kg)  09/22/17 207 lb (93.9 kg)  09/15/17 211 lb 12 oz (96 kg)     Cardiac Studies Reviewed: Echo 08-08-2017: Study Conclusions  - Left ventricle: The cavity size was normal. There was severe   concentric hypertrophy. Systolic function was normal. The   estimated ejection fraction was in the range of 55% to 60%. Wall   motion was normal; there were no regional wall motion   abnormalities. Features are consistent with a pseudonormal left   ventricular filling pattern, with concomitant abnormal relaxation   and increased filling pressure (grade 2 diastolic dysfunction).   Doppler parameters are consistent with high ventricular filling   pressure. - Aortic valve: Right coronary cusp immobility was noted. There was   moderate to severe stenosis. There was trivial regurgitation.   Mean gradient (S): 32 mm Hg. - Aorta: Ascending aortic diameter: 41 mm (S). - Ascending aorta: The ascending aorta was mildly dilated. - Mitral valve: Severely calcified annulus. Diffuse calcification   of the anterior leaflet. There was mild regurgitation. - Left atrium: The atrium was mildly dilated. - Tricuspid valve: There was trivial regurgitation. - Pulmonary arteries: PA peak pressure: 39 mm Hg (S).  Impressions:  - The right ventricular systolic pressure was increased consistent   with mild pulmonary  hypertension.  ------------------------------------------------------------------- Study data:  Comparison was made to the study of 06/13/2015.  Study status:  Routine.  Procedure:  The patient reported no pain pre or post test. Transthoracic echocardiography for left ventricular function evaluation and for assessment of valvular function. Image quality was adequate.  Study completion:  There were no complications.          Echocardiography.  M-mode, complete 2D, spectral Doppler, and color Doppler.  Birthdate:  Patient birthdate: 05-24-1932.  Age:  Patient is 84 yr old.  Sex:  Gender: male.    BMI: 27.1 kg/m^2.  Blood pressure:     112/66  Patient status:  Outpatient.  Study date:  Study date: 08/08/2017. Study time: 11:50 AM.  Location:  [facility]  -------------------------------------------------------------------  ------------------------------------------------------------------- Left ventricle:  The cavity size was normal. There was severe concentric hypertrophy. Systolic function was normal. The estimated ejection fraction was in the range of 55% to 60%. Wall motion was normal; there were no regional wall motion abnormalities. Features are consistent with a pseudonormal left ventricular filling pattern, with concomitant abnormal relaxation and increased filling pressure (grade 2 diastolic dysfunction). Doppler parameters are consistent with high ventricular filling pressure.  ------------------------------------------------------------------- Aortic valve:   Trileaflet; moderately thickened, severely calcified leaflets.  Right coronary cusp immobility was noted. Doppler:   There was moderate to severe stenosis.   There was trivial regurgitation.    VTI ratio of LVOT to aortic valve: 0.25. Valve area (VTI): 0.8 cm^2. Indexed valve area (VTI): 0.36 cm^2/m^2. Peak velocity ratio of LVOT to aortic valve: 0.22. Valve area (Vmax): 0.7 cm^2. Indexed valve area (Vmax):  0.32 cm^2/m^2. Mean velocity ratio of LVOT to aortic valve: 0.24. Valve area (Vmean): 0.75 cm^2. Indexed valve area (Vmean): 0.34 cm^2/m^2. Mean gradient (S): 32 mm Hg. Peak gradient (S): 58 mm Hg.  ------------------------------------------------------------------- Aorta:  Aortic root: The aortic root was normal in size. Ascending  aorta: The ascending aorta was mildly dilated.  ------------------------------------------------------------------- Mitral valve:   Severely calcified annulus.  Diffuse calcification of the anterior leaflet. Mobility was not restricted.  Doppler: Transvalvular velocity was within the normal range. There was no evidence for stenosis. There was mild regurgitation.    Peak gradient (D): 9 mm Hg.  ------------------------------------------------------------------- Left atrium:  The atrium was mildly dilated.  ------------------------------------------------------------------- Right ventricle:  The cavity size was normal. Wall thickness was normal. Systolic function was normal.  ------------------------------------------------------------------- Pulmonic valve:    Doppler:  Transvalvular velocity was within the normal range. There was no evidence for stenosis.  ------------------------------------------------------------------- Tricuspid valve:   Structurally normal valve.    Doppler: Transvalvular velocity was within the normal range. There was trivial regurgitation.  ------------------------------------------------------------------- Pulmonary artery:   The main pulmonary artery was normal-sized. Systolic pressure was within the normal range.  ------------------------------------------------------------------- Right atrium:  The atrium was normal in size.  ------------------------------------------------------------------- Pericardium:  There was no pericardial  effusion.  ------------------------------------------------------------------- Systemic veins: Inferior vena cava: The vessel was normal in size.  ------------------------------------------------------------------- Measurements   Left ventricle                            Value          Reference  LV ID, ED, PLAX chordal                   43.4  mm       43 - 52  LV ID, ES, PLAX chordal                   27.6  mm       23 - 38  LV fx shortening, PLAX chordal            36    %        >=29  LV PW thickness, ED                       15.1  mm       ---------  IVS/LV PW ratio, ED                       1.09           <=1.3  Stroke volume, 2D                         65    ml       ---------  Stroke volume/bsa, 2D                     30    ml/m^2   ---------  LV ejection fraction, 1-p A4C             56    %        ---------  LV end-diastolic volume, 2-p              106   ml       ---------  LV end-systolic volume, 2-p               47    ml       ---------  LV ejection fraction, 2-p                 56    %        ---------  Stroke volume, 2-p                        59    ml       ---------  LV end-diastolic volume/bsa, 2-p          48    ml/m^2   ---------  LV end-systolic volume/bsa, 2-p           21    ml/m^2   ---------  Stroke volume/bsa, 2-p                    26.8  ml/m^2   ---------  LV e&', lateral                            4.78  cm/s     ---------  LV E/e&', lateral                          30.54          ---------  LV e&', medial                             4.09  cm/s     ---------  LV E/e&', medial                           35.7           ---------  LV e&', average                            4.44  cm/s     ---------  LV E/e&', average                          32.92          ---------    Ventricular septum                        Value          Reference  IVS thickness, ED                         16.4  mm       ---------    LVOT                                      Value           Reference  LVOT ID, S                                20    mm       ---------  LVOT area                                 3.14  cm^2     ---------  LVOT ID  20    mm       ---------  LVOT peak velocity, S                     85.7  cm/s     ---------  LVOT mean velocity, S                     62.1  cm/s     ---------  LVOT VTI, S                               20.7  cm       ---------  LVOT peak gradient, S                     3     mm Hg    ---------  Stroke volume (SV), LVOT DP               65    ml       ---------  Stroke index (SV/bsa), LVOT DP            29.5  ml/m^2   ---------    Aortic valve                              Value          Reference  Aortic valve peak velocity, S             382   cm/s     ---------  Aortic valve mean velocity, S             259   cm/s     ---------  Aortic valve VTI, S                       81.5  cm       ---------  Aortic mean gradient, S                   32    mm Hg    ---------  Aortic peak gradient, S                   58    mm Hg    ---------  VTI ratio, LVOT/AV                        0.25           ---------  Aortic valve area, VTI                    0.8   cm^2     ---------  Aortic valve area/bsa, VTI                0.36  cm^2/m^2 ---------  Velocity ratio, peak, LVOT/AV             0.22           ---------  Aortic valve area, peak velocity          0.7   cm^2     ---------  Aortic valve area/bsa, peak               0.32  cm^2/m^2 ---------  velocity  Velocity ratio, mean, LVOT/AV  0.24           ---------  Aortic valve area, mean velocity          0.75  cm^2     ---------  Aortic valve area/bsa, mean               0.34  cm^2/m^2 ---------  velocity  Aortic regurg pressure half-time          508   ms       ---------    Aorta                                     Value          Reference  Aortic root ID, ED                        36    mm       ---------  Ascending aorta ID, A-P, S                 41    mm       ---------    Left atrium                               Value          Reference  LA ID, A-P, ES                            45    mm       ---------  LA ID/bsa, A-P                            2.04  cm/m^2   <=2.2  LA volume, S                              85    ml       ---------  LA volume/bsa, S                          38.6  ml/m^2   ---------  LA volume, ES, 1-p A4C                    99    ml       ---------  LA volume/bsa, ES, 1-p A4C                45    ml/m^2   ---------  LA volume, ES, 1-p A2C                    74    ml       ---------  LA volume/bsa, ES, 1-p A2C                33.6  ml/m^2   ---------    Mitral valve                              Value          Reference  Mitral E-wave peak velocity  146   cm/s     ---------  Mitral A-wave peak velocity               147   cm/s     ---------  Mitral deceleration time          (H)     412   ms       150 - 230  Mitral peak gradient, D                   9     mm Hg    ---------  Mitral E/A ratio, peak                    1              ---------    Pulmonary arteries                        Value          Reference  PA pressure, S, DP                (H)     39    mm Hg    <=30    Tricuspid valve                           Value          Reference  Tricuspid regurg peak velocity            299   cm/s     ---------  Tricuspid peak RV-RA gradient             36    mm Hg    ---------    Systemic veins                            Value          Reference  Estimated CVP                             3     mm Hg    ---------    Right ventricle                           Value          Reference  RV pressure, S, DP                (H)     39    mm Hg    <=30  RV s&', lateral, S                         13.9  cm/s     ---------  STS Adult Cardiac Surgery Database Version 2.9  Procedure: Isolated AVR   Risk of Mortality: 4.280%  Renal Failure: 3.990%   Permanent Stroke: 2.020%  Prolonged Ventilation: 12.650%   DSW Infection: 0.359%  Reoperation: 3.776%  Morbidity or Mortality: 17.345%  Short Length of Stay: 21.480%   Long Length of Stay: 11.287%   ASSESSMENT AND PLAN: 84 year old male with stage D1, severe symptomatic aortic stenosis. I have reviewed the natural history of aortic stenosis with the patient and their family members who are present today. We  have discussed the limitations of medical therapy and the poor prognosis associated with symptomatic aortic stenosis. We have reviewed potential treatment options, including palliative medical therapy, conventional surgical aortic valve replacement, and transcatheter aortic valve replacement. We discussed treatment options in the context of the patient's specific comorbid medical conditions.   I have personally reviewed the patient's echo images.  He has preserved LV systolic function with calcification and restriction of all 3 aortic valve leaflets.  He has some mobility of the noncoronary leaflet with severe calcification of the right and left leaflets.  The peak and mean transvalvular gradients are 58 and 34 mmHg, respectively.  The dimensionless index is less than 0.25.  His Doppler criteria are borderline for moderate to severe aortic stenosis, but his clinical picture is that of progressive shortness of breath and recent hospitalization with heart failure.  We reviewed further workup to determine treatment options and direct whether we should proceed with aortic valve replacement immediately versus careful surveillance.  I think right and left heart catheterization would better evaluate his hemodynamics and coronary and graft anatomy. I have reviewed the risks, indications, and alternatives to cardiac catheterization, possible angioplasty, and stenting with the patient. Risks include but are not limited to bleeding, infection, vascular injury, stroke, myocardial infection, arrhythmia, kidney injury, radiation-related injury in the case of prolonged  fluoroscopy use, emergency cardiac surgery, and death. The patient understands the risks of serious complication is 1-2 in 1497 with diagnostic cardiac cath and 1-2% or less with angioplasty/stenting.   The patient would like to proceed with further evaluation. He understands that if his catheterization is consistent with severe aortic stenosis, we will likely proceed with a gated cardiac CT scan and a CTA of the chest abdomen and pelvis followed by surgical consultation.  We reviewed considerations around conventional surgery and transcatheter aortic valve replacement today.  Considering his advanced age, reduced functional capacity as he ambulates with a walker, long-standing diabetes, atrial fibrillation, and previous cardiac surgery, I think he would be at much lower risk of undergoing TAVR.  All of his questions are answered today.  He will be scheduled for cardiac catheterization at the next available time.  He is advised regarding holding his apixaban, lisinopril, and metformin prior to the procedure.  Current medicines are reviewed with the patient today.  The patient  concerns regarding medicines.  Labs/ tests ordered today include:   Orders Placed This Encounter  Procedures  . Basic metabolic panel  . CBC with Differential/Platelet  . INR/PT    Disposition:   FU pending test results  Signed, Sherren Mocha, MD  10/14/2017 5:32 PM    [address], [place], [place]  [address] Phone: [phone]; Fax: [phone]

## 2017-10-15 ENCOUNTER — Other Ambulatory Visit: Payer: Self-pay | Admitting: Family Medicine

## 2017-10-15 LAB — CBC WITH DIFFERENTIAL/PLATELET
Basophils Absolute: 0 10*3/uL (ref 0.0–0.2)
Basos: 1 %
EOS (ABSOLUTE): 0.4 10*3/uL (ref 0.0–0.4)
EOS: 6 %
HEMATOCRIT: 41.1 % (ref 37.5–51.0)
Hemoglobin: 13.5 g/dL (ref 13.0–17.7)
IMMATURE GRANS (ABS): 0 10*3/uL (ref 0.0–0.1)
IMMATURE GRANULOCYTES: 0 %
LYMPHS: 23 %
Lymphocytes Absolute: 1.5 10*3/uL (ref 0.7–3.1)
MCH: 30.4 pg (ref 26.6–33.0)
MCHC: 32.8 g/dL (ref 31.5–35.7)
MCV: 93 fL (ref 79–97)
MONOCYTES: 11 %
MONOS ABS: 0.7 10*3/uL (ref 0.1–0.9)
NEUTROS PCT: 59 %
Neutrophils Absolute: 3.9 10*3/uL (ref 1.4–7.0)
Platelets: 189 10*3/uL (ref 150–379)
RBC: 4.44 x10E6/uL (ref 4.14–5.80)
RDW: 13.5 % (ref 12.3–15.4)
WBC: 6.5 10*3/uL (ref 3.4–10.8)

## 2017-10-15 LAB — BASIC METABOLIC PANEL
BUN/Creatinine Ratio: 22 (ref 10–24)
BUN: 25 mg/dL (ref 8–27)
CALCIUM: 9.4 mg/dL (ref 8.6–10.2)
CO2: 31 mmol/L — ABNORMAL HIGH (ref 20–29)
CREATININE: 1.14 mg/dL (ref 0.76–1.27)
Chloride: 97 mmol/L (ref 96–106)
GFR calc Af Amer: 68 mL/min/{1.73_m2} (ref >59)
GFR, EST NON AFRICAN AMERICAN: 59 mL/min/{1.73_m2} — AB (ref >59)
GLUCOSE: 144 mg/dL — AB (ref 65–99)
Potassium: 4.8 mmol/L (ref 3.5–5.2)
SODIUM: 142 mmol/L (ref 134–144)

## 2017-10-15 LAB — PROTIME-INR
INR: 1.1 (ref 0.8–1.2)
PROTHROMBIN TIME: 11 s (ref 9.1–12.0)

## 2017-10-19 ENCOUNTER — Telehealth: Payer: Self-pay

## 2017-10-19 NOTE — Telephone Encounter (Signed)
Patient contacted pre-catheterization at [facility] scheduled for:  10/20/2017 @ 1030 Verified arrival time and place:  NT @ 0800 Confirmed AM meds to be taken pre-cath with sip of water: Eliquis last dose 1/14 Metformin-hold today and tomorrow Lantus-half dose night prior Lisinopril- hold today and tomorrow Torsemide- hold am of Take ASA Confirmed patient has responsible person to drive home post procedure and observe patient for 24 hours:  yes Addl concerns:  none

## 2017-10-20 ENCOUNTER — Ambulatory Visit (HOSPITAL_COMMUNITY)
Admission: RE | Admit: 2017-10-20 | Discharge: 2017-10-20 | Disposition: A | Discharge status: Home / Self Care | Payer: Medicare Other | Source: Ambulatory Visit | Attending: Cardiovascular Disease | Admitting: Cardiovascular Disease

## 2017-10-20 ENCOUNTER — Encounter (HOSPITAL_COMMUNITY)
Admission: RE | Disposition: A | Discharge status: Home / Self Care | Payer: Self-pay | Source: Ambulatory Visit | Attending: Cardiovascular Disease

## 2017-10-20 DIAGNOSIS — J449 Chronic obstructive pulmonary disease, unspecified: Secondary | ICD-10-CM | POA: Insufficient documentation

## 2017-10-20 DIAGNOSIS — Z87891 Personal history of nicotine dependence: Secondary | ICD-10-CM | POA: Diagnosis not present

## 2017-10-20 DIAGNOSIS — E785 Hyperlipidemia, unspecified: Secondary | ICD-10-CM | POA: Insufficient documentation

## 2017-10-20 DIAGNOSIS — Z7901 Long term (current) use of anticoagulants: Secondary | ICD-10-CM | POA: Diagnosis not present

## 2017-10-20 DIAGNOSIS — I110 Hypertensive heart disease with heart failure: Secondary | ICD-10-CM | POA: Insufficient documentation

## 2017-10-20 DIAGNOSIS — Z888 Allergy status to other drugs, medicaments and biological substances status: Secondary | ICD-10-CM | POA: Insufficient documentation

## 2017-10-20 DIAGNOSIS — Z794 Long term (current) use of insulin: Secondary | ICD-10-CM | POA: Insufficient documentation

## 2017-10-20 DIAGNOSIS — Z79899 Other long term (current) drug therapy: Secondary | ICD-10-CM | POA: Insufficient documentation

## 2017-10-20 DIAGNOSIS — Z791 Long term (current) use of non-steroidal anti-inflammatories (NSAID): Secondary | ICD-10-CM | POA: Diagnosis not present

## 2017-10-20 DIAGNOSIS — I2510 Atherosclerotic heart disease of native coronary artery without angina pectoris: Secondary | ICD-10-CM

## 2017-10-20 DIAGNOSIS — I4891 Unspecified atrial fibrillation: Secondary | ICD-10-CM | POA: Diagnosis not present

## 2017-10-20 DIAGNOSIS — I272 Pulmonary hypertension, unspecified: Secondary | ICD-10-CM | POA: Diagnosis not present

## 2017-10-20 DIAGNOSIS — E119 Type 2 diabetes mellitus without complications: Secondary | ICD-10-CM | POA: Insufficient documentation

## 2017-10-20 DIAGNOSIS — Z951 Presence of aortocoronary bypass graft: Secondary | ICD-10-CM | POA: Diagnosis not present

## 2017-10-20 DIAGNOSIS — K219 Gastro-esophageal reflux disease without esophagitis: Secondary | ICD-10-CM | POA: Diagnosis not present

## 2017-10-20 DIAGNOSIS — I509 Heart failure, unspecified: Secondary | ICD-10-CM | POA: Insufficient documentation

## 2017-10-20 DIAGNOSIS — I350 Nonrheumatic aortic (valve) stenosis: Secondary | ICD-10-CM | POA: Diagnosis not present

## 2017-10-20 DIAGNOSIS — F329 Major depressive disorder, single episode, unspecified: Secondary | ICD-10-CM | POA: Insufficient documentation

## 2017-10-20 DIAGNOSIS — I480 Paroxysmal atrial fibrillation: Secondary | ICD-10-CM | POA: Diagnosis not present

## 2017-10-20 HISTORY — PX: RIGHT/LEFT HEART CATH AND CORONARY/GRAFT ANGIOGRAPHY: CATH118267

## 2017-10-20 LAB — POCT I-STAT 3, VENOUS BLOOD GAS (G3P V)
Acid-Base Excess: 5 mmol/L — ABNORMAL HIGH (ref 0.0–2.0)
BICARBONATE: 31.7 mmol/L — AB (ref 20.0–28.0)
O2 Saturation: 51 %
PCO2 VEN: 58.3 mmHg (ref 44.0–60.0)
PO2 VEN: 30 mmHg — AB (ref 32.0–45.0)
TCO2: 33 mmol/L — ABNORMAL HIGH (ref 22–32)
pH, Ven: 7.344 (ref 7.250–7.430)

## 2017-10-20 LAB — GLUCOSE, CAPILLARY
GLUCOSE-CAPILLARY: 88 mg/dL (ref 65–99)
Glucose-Capillary: 136 mg/dL — ABNORMAL HIGH (ref 65–99)

## 2017-10-20 LAB — POCT I-STAT 3, ART BLOOD GAS (G3+)
Acid-Base Excess: 4 mmol/L — ABNORMAL HIGH (ref 0.0–2.0)
BICARBONATE: 30.6 mmol/L — AB (ref 20.0–28.0)
O2 SAT: 89 %
PCO2 ART: 56.1 mmHg — AB (ref 32.0–48.0)
PH ART: 7.345 — AB (ref 7.350–7.450)
TCO2: 32 mmol/L (ref 22–32)
pO2, Arterial: 61 mmHg — ABNORMAL LOW (ref 83.0–108.0)

## 2017-10-20 SURGERY — RIGHT/LEFT HEART CATH AND CORONARY/GRAFT ANGIOGRAPHY
Anesthesia: LOCAL

## 2017-10-20 MED ORDER — SODIUM CHLORIDE 0.9 % IV SOLN: INTRAVENOUS | Status: AC

## 2017-10-20 MED ORDER — IOPAMIDOL (ISOVUE-370) INJECTION 76%
INTRAVENOUS | Status: AC
  Filled 2017-10-20: qty 125

## 2017-10-20 MED ORDER — VERAPAMIL HCL 2.5 MG/ML IV SOLN
INTRAVENOUS | Status: AC
  Filled 2017-10-20: qty 2

## 2017-10-20 MED ORDER — HEPARIN SODIUM (PORCINE) 1000 UNIT/ML IJ SOLN
INTRAMUSCULAR | Status: AC
  Filled 2017-10-20: qty 1

## 2017-10-20 MED ORDER — MIDAZOLAM HCL 2 MG/2ML IJ SOLN
INTRAMUSCULAR | Status: DC | PRN
  Administered 2017-10-20: 1 mg via INTRAVENOUS

## 2017-10-20 MED ORDER — LIDOCAINE HCL (PF) 1 % IJ SOLN
INTRAMUSCULAR | Status: AC
  Filled 2017-10-20: qty 30

## 2017-10-20 MED ORDER — HEPARIN SODIUM (PORCINE) 1000 UNIT/ML IJ SOLN
INTRAMUSCULAR | Status: DC | PRN
  Administered 2017-10-20: 5000 [IU] via INTRAVENOUS

## 2017-10-20 MED ORDER — HEPARIN (PORCINE) IN NACL 2-0.9 UNIT/ML-% IJ SOLN
INTRAMUSCULAR | Status: AC
  Filled 2017-10-20: qty 1000

## 2017-10-20 MED ORDER — FENTANYL CITRATE (PF) 100 MCG/2ML IJ SOLN
INTRAMUSCULAR | Status: AC
  Filled 2017-10-20: qty 2

## 2017-10-20 MED ORDER — LIDOCAINE HCL (PF) 1 % IJ SOLN
INTRAMUSCULAR | Status: DC | PRN
  Administered 2017-10-20 (×2): 2 mL via INTRADERMAL

## 2017-10-20 MED ORDER — SODIUM CHLORIDE 0.9 % IV SOLN: 250.0000 mL | INTRAVENOUS | Status: DC | PRN

## 2017-10-20 MED ORDER — SODIUM CHLORIDE 0.9% FLUSH: 3.0000 mL | INTRAVENOUS | Status: DC | PRN

## 2017-10-20 MED ORDER — HEPARIN (PORCINE) IN NACL 2-0.9 UNIT/ML-% IJ SOLN
INTRAMUSCULAR | Status: AC | PRN
  Administered 2017-10-20: 1000 mL via INTRA_ARTERIAL

## 2017-10-20 MED ORDER — MIDAZOLAM HCL 2 MG/2ML IJ SOLN
INTRAMUSCULAR | Status: AC
  Filled 2017-10-20: qty 2

## 2017-10-20 MED ORDER — SODIUM CHLORIDE 0.9 % WEIGHT BASED INFUSION: 1.0000 mL/kg/h | INTRAVENOUS | Status: DC

## 2017-10-20 MED ORDER — VERAPAMIL HCL 2.5 MG/ML IV SOLN
INTRAVENOUS | Status: DC | PRN
  Administered 2017-10-20: 10 mL via INTRA_ARTERIAL

## 2017-10-20 MED ORDER — FENTANYL CITRATE (PF) 100 MCG/2ML IJ SOLN
INTRAMUSCULAR | Status: DC | PRN
  Administered 2017-10-20: 25 ug via INTRAVENOUS

## 2017-10-20 MED ORDER — ASPIRIN 81 MG PO CHEW: 81.0000 mg | CHEWABLE_TABLET | ORAL | Status: DC

## 2017-10-20 MED ORDER — SODIUM CHLORIDE 0.9 % WEIGHT BASED INFUSION
3.0000 mL/kg/h | INTRAVENOUS | Status: AC
  Administered 2017-10-20: 3 mL/kg/h via INTRAVENOUS

## 2017-10-20 MED ORDER — SODIUM CHLORIDE 0.9% FLUSH: 3.0000 mL | Freq: Two times a day (BID) | INTRAVENOUS | Status: DC

## 2017-10-20 MED ORDER — IOPAMIDOL (ISOVUE-370) INJECTION 76%
INTRAVENOUS | Status: DC | PRN
  Administered 2017-10-20: 115 mL via INTRA_ARTERIAL

## 2017-10-20 SURGICAL SUPPLY — 14 items
CATH BALLN WEDGE 5F 110CM (CATHETERS) ×2 IMPLANT
CATH INFINITI 5 FR IM (CATHETERS) ×2 IMPLANT
CATH INFINITI 5FR MULTPACK ANG (CATHETERS) ×4 IMPLANT
DEVICE RAD COMP TR BAND LRG (VASCULAR PRODUCTS) ×2 IMPLANT
GLIDESHEATH SLEND SS 6F .021 (SHEATH) ×2 IMPLANT
GUIDEWIRE INQWIRE 1.5J.035X260 (WIRE) ×1 IMPLANT
INQWIRE 1.5J .035X260CM (WIRE) ×2
KIT HEART LEFT (KITS) ×2 IMPLANT
PACK CARDIAC CATHETERIZATION (CUSTOM PROCEDURE TRAY) ×2 IMPLANT
SHEATH GLIDE SLENDER 4/5FR (SHEATH) ×2 IMPLANT
TRANSDUCER W/STOPCOCK (MISCELLANEOUS) ×2 IMPLANT
TUBING CIL FLEX 10 FLL-RA (TUBING) ×2 IMPLANT
WIRE COUGAR XT STRL 190CM (WIRE) ×2 IMPLANT
WIRE HI TORQ VERSACORE-J 145CM (WIRE) ×2 IMPLANT

## 2017-10-20 NOTE — Interval H&P Note (Signed)
History and Physical Interval Note:  10/20/2017 12:02 PM  Charles [name]  has presented today for cardiac cath with the diagnosis of severe aortic Stenosis  The various methods of treatment have been discussed with the patient and family. After consideration of risks, benefits and other options for treatment, the patient has consented to  Procedure(s): RIGHT/LEFT HEART CATH AND CORONARY/GRAFT ANGIOGRAPHY (N/A) as a surgical intervention .  The patient's history has been reviewed, patient examined, no change in status, stable for surgery.  I have reviewed the patient's chart and labs.  Questions were answered to the patient's satisfaction.   Cath Lab Visit (complete for each Cath Lab visit)  Clinical Evaluation Leading to the Procedure:   ACS: No.  Non-ACS:    Anginal Classification: CCS II  Anti-ischemic medical therapy: Minimal Therapy (1 class of medications)  Non-Invasive Test Results: No non-invasive testing performed  Prior CABG: Previous CABG          Lauree Chandler

## 2017-10-20 NOTE — Discharge Instructions (Signed)
Hold Metformin for 48 hours post cath Resume Eliquis tomorrow.   Radial Site Care Refer to this sheet in the next few weeks. These instructions provide you with information about caring for yourself after your procedure. Your health care provider may also give you more specific instructions. Your treatment has been planned according to current medical practices, but problems sometimes occur. Call your health care provider if you have any problems or questions after your procedure. What can I expect after the procedure? After your procedure, it is typical to have the following:  Bruising at the radial site that usually fades within 1-2 weeks.  Blood collecting in the tissue (hematoma) that may be painful to the touch. It should usually decrease in size and tenderness within 1-2 weeks.  Follow these instructions at home:  Take medicines only as directed by your health care provider.  You may shower 24-48 hours after the procedure or as directed by your health care provider. Remove the bandage (dressing) and gently wash the site with plain soap and water. Pat the area dry with a clean towel. Do not rub the site, because this may cause bleeding.  Do not take baths, swim, or use a hot tub until your health care provider approves.  Check your insertion site every day for redness, swelling, or drainage.  Do not apply powder or lotion to the site.  Do not flex or bend the affected arm for 24 hours or as directed by your health care provider.  Do not push or pull heavy objects with the affected arm for 24 hours or as directed by your health care provider.  Do not lift over 10 lb (4.5 kg) for 5 days after your procedure or as directed by your health care provider.  Ask your health care provider when it is okay to: ? Return to work or school. ? Resume usual physical activities or sports. ? Resume sexual activity.  Do not drive home if you are discharged the same day as the procedure. Have  someone else drive you.  You may drive 24 hours after the procedure unless otherwise instructed by your health care provider.  Do not operate machinery or power tools for 24 hours after the procedure.  If your procedure was done as an outpatient procedure, which means that you went home the same day as your procedure, a responsible adult should be with you for the first 24 hours after you arrive home.  Keep all follow-up visits as directed by your health care provider. This is important. Contact a health care provider if:  You have a fever.  You have chills.  You have increased bleeding from the radial site. Hold pressure on the site. Get help right away if:  You have unusual pain at the radial site.  You have redness, warmth, or swelling at the radial site.  You have drainage (other than a small amount of blood on the dressing) from the radial site.  The radial site is bleeding, and the bleeding does not stop after 30 minutes of holding steady pressure on the site.  Your arm or hand becomes pale, cool, tingly, or numb. This information is not intended to replace advice given to you by your health care provider. Make sure you discuss any questions you have with your health care provider. Document Released: 10/23/2010 Document Revised: 02/26/2016 Document Reviewed: 04/08/2014 Elsevier Interactive Patient Education  2018 [employer].

## 2017-10-21 ENCOUNTER — Encounter (HOSPITAL_COMMUNITY): Payer: Self-pay | Admitting: Cardiovascular Disease

## 2017-10-25 ENCOUNTER — Telehealth: Payer: Self-pay

## 2017-10-25 NOTE — Telephone Encounter (Signed)
Pt had cardiac cath last week by Dr Angelena Form and this was reviewed with Dr Burt Knack. Dr Burt Knack recommended that the pt see TAVR Cardiac surgeon for opinion before proceeding with any additional work up. I contacted pt's daughter in law on 10/24/17 and booked the pt for consult with Dr Cyndia Bent on 10/26/17.  Per [name] the pt is leaning toward not proceeding with any additional evaluation. I advised her to speak with the pt and contact me on 10/25/17 to let me know if the pt wants to proceed with TAVR evaluation.   10/25/17 [name] called and she spoke with the pt and he does not want to proceed with any further TAVR evaluation at this time.  I will cancel the pt's apt with Dr Cyndia Bent and the pt plans to continue routine cardiology follow-up with Dr [name].

## 2017-10-26 ENCOUNTER — Encounter: Payer: Medicare Other | Admitting: Surgery

## 2017-10-27 NOTE — Telephone Encounter (Signed)
Ok thanks - would be reasonable to follow with serial echo imaging.

## 2017-10-28 ENCOUNTER — Telehealth: Payer: Self-pay | Admitting: Physician Assistant

## 2017-10-28 NOTE — Telephone Encounter (Signed)
Returned call to patient's daughter Charles [name]) - sees Dr. Avelina Laine. Charles [name] with significant coronary history. She reports that patient has had symptoms for a few days - ankle swelling and slow HR. She states patient denies weight gain. She states he checks his BP and HR but she does not know his VS off hand and is not with him. Explained that medication changes could not be recommended unless we know what his BP and HR are running.   Attempted to call patient - phone rang continuously - no answer  Called Charles [name] back and asked that she have her father call our office with his BP, HR, weight trends to determine if he is volume overloaded (swelling) and if his HR is too low (trends in the 70s per last visits). Advised they call for a weight gain of 3lbs in 24 hours, 5lbs in 1 week. Informed her that we have cardiology on-call providers after hours and on the weekend to address acute concerns.   Routed to MD as Juluis Rainier

## 2017-10-28 NOTE — Telephone Encounter (Signed)
Pt c/o medication issue:  1. Name of Medication: Carvedilol   2. How are you currently taking this medication (dosage and times per day)? 3.125 mg, twice mouth daily   3. Are you having a reaction (difficulty breathing--STAT)? No   4. What is your medication issue? Both ankles swelling, believes it slowing the heart rate. Would like to know if patient discontinue medication

## 2017-11-07 ENCOUNTER — Other Ambulatory Visit: Payer: Self-pay | Admitting: Family Medicine

## 2017-11-11 ENCOUNTER — Other Ambulatory Visit: Payer: Self-pay | Admitting: Family Medicine

## 2017-12-12 ENCOUNTER — Other Ambulatory Visit: Payer: Self-pay | Admitting: Family Medicine

## 2017-12-12 NOTE — Telephone Encounter (Signed)
Last filled:  11/15/17, #60 Last OV:  08/24/17 Next OV:  09/21/18

## 2017-12-21 ENCOUNTER — Encounter: Payer: Self-pay | Admitting: Cardiovascular Disease

## 2017-12-21 ENCOUNTER — Ambulatory Visit: Payer: Medicare Other | Admitting: Cardiovascular Disease

## 2017-12-21 DIAGNOSIS — I350 Nonrheumatic aortic (valve) stenosis: Secondary | ICD-10-CM

## 2017-12-21 DIAGNOSIS — I10 Essential (primary) hypertension: Secondary | ICD-10-CM | POA: Diagnosis not present

## 2017-12-21 DIAGNOSIS — I2510 Atherosclerotic heart disease of native coronary artery without angina pectoris: Secondary | ICD-10-CM

## 2017-12-21 DIAGNOSIS — E7800 Pure hypercholesterolemia, unspecified: Secondary | ICD-10-CM

## 2017-12-21 DIAGNOSIS — I483 Typical atrial flutter: Secondary | ICD-10-CM | POA: Diagnosis not present

## 2017-12-21 NOTE — Patient Instructions (Signed)
Medication Instructions: Your physician recommends that you continue on your current medications as directed. Please refer to the Current Medication list given to you today.   Testing/Procedures:  Schedule echocardiogram for November.  Follow-Up: We request that you follow-up in: 6 months with an extender and in 12 months with Dr Andria Rhein will receive a reminder letter in the mail two months in advance. If you don't receive a letter, please call our office to schedule the follow-up appointment.  If you need a refill on your cardiac medications before your next appointment, please call your pharmacy.

## 2017-12-21 NOTE — Assessment & Plan Note (Signed)
History of CAD status post coronary artery bypass grafting June 2004 with a LIMA to his LAD, vein to the right coronary artery and obtuse marginal branch. He recently underwent right and left heart cath by Dr. Angelena Form 10/20/17 revealing patent grafts with moderate aortic stenosis.he currently denies chest pain or shortness of breath.

## 2017-12-21 NOTE — Assessment & Plan Note (Signed)
Charles [name] has moderate aortic stenosis demonstrated both by 2-D echo and right and left heart cath. He was being considered for T AVR by Dr. Burt Knack for his symptoms and degree of aortic stenosis did not support proceeding with this at this time.

## 2017-12-21 NOTE — Progress Notes (Signed)
12/21/2017 Charles [name]   06-21-1932  [id number]  Primary Physician [name], MD Primary Cardiologist: [name] MD Charles [name], [name]  HPI:  Charles [name] is a 84 y.o.  mildly overweight, married Caucasian male whose wife was also a patient of mine. She unfortunately passed away 2015-08-03. He currently lives with one of his sons and appears to be fairly depressed. He was married for 51 years.He is a father of 33, grandfather to 4 grandchildren. I last saw him in the office 01/19/17. He is accompanied by his son Charles [name] today.Charles [name] He has a history of CAD status post coronary artery bypass grafting x4 June 2004 with a LIMA to his LAD; a vein to the right coronary artery, obtuse marginal branch and ramus branch. He also was found to have 70% bilateral renal artery stenosis which we have been following by duplex ultrasound. He has had left carotid endarterectomy which we follow as well. His other problems include hypertension, hyperlipidemia and noninsulin-requiring diabetes. He is asymptomatic. His last functional study performed 2/14 was nonischemic. he denies chest pain or shortness of breath. He had new onset atrial flutter back in the fall and underwent TEE guided DC cardioversion by Dr. Debara Pickett 06/25/15 successfully to sinus rhythm. He was placed on Eliquis oral anticoagulation but has ultimately decided not to take this for various reasons.recent renal Dopplers have suggested progression of disease bilaterally left greater than right. His blood pressures have been more difficult to control as well. In addition, his echo performed 06/13/15 was significant for moderate to severe aortic stenosis with a valve area of 0.9 cm2 and a peak gradient of 49 mmHg. since I saw him in the office 5 months ago he's noticed increasing shortness of breath and fatigue over the last 3 weeks. He saw his PCP who did an EKG demonstrating recurrent atrial flutter with slow ventricular response. He underwent  successful outpatient TEE guided cardioversion by Dr. Oval Linsey on 11/02/16 back to sinus rhythm with one shock. He remains on oral anticoagulation. He is currently improved and is currently asymptomatic.  He was evaluated by Dr. Burt Knack for T aVR 10/14/17 and underwent right and left heart cath by Dr. Angelena Form  a week later revealing patent grafts with at most moderate aortic stenosis.     Current Meds  Medication Sig  . ACCU-CHEK AVIVA PLUS test strip CHECK BLOOD SUGAR THREE TIMES A DAY AND AS DIRECTED.  Charles [name] acetaminophen (TYLENOL) 500 MG tablet Take 1 tablet (500 mg total) by mouth 3 (three) times daily. (Patient taking differently: Take 500 mg by mouth daily. )  . atorvastatin (LIPITOR) 20 MG tablet TAKE 1 TABLET DAILY.  Charles [name] BAYER MICROLET LANCETS lancets Ck blood sugar three times daily and as directed. Dx 250.00. Insulin dependent  . carvedilol (COREG) 3.125 MG tablet Take 1 tablet (3.125 mg total) by mouth 2 (two) times daily.  . citalopram (CELEXA) 20 MG tablet Take 1 tablet (20 mg total) by mouth daily.  . Cyanocobalamin (B-12) 500 MCG TABS Take 500 mcg by mouth daily.   Charles [name] ELIQUIS 5 MG TABS tablet Take 1 tablet (5 mg total) by mouth 2 (two) times daily.  . fluticasone (FLONASE) 50 MCG/ACT nasal spray Place 2 sprays into both nostrils daily.   . Insulin Syringe-Needle U-100 (B-D INS SYR ULTRAFINE 1CC/30G) 30G X 1/2" 1 ML MISC 1 Syringe by Does not apply route as directed.  . Insulin Syringe-Needle U-100 (B-D INS SYRINGE 0.5CC/30GX1/2") 30G X 1/2" 0.5  ML MISC Use as directed to inject insulin. Dx: E11.65  . LANTUS 100 UNIT/ML injection Inject 0.35 mLs (35 Units total) into the skin at bedtime.  Charles [name] lisinopril (PRINIVIL,ZESTRIL) 40 MG tablet Take 1 tablet (40 mg total) by mouth daily.  . metFORMIN (GLUCOPHAGE) 1000 MG tablet Take 1 tablet (1,000 mg total) by mouth 2 (two) times daily with a meal.  . pantoprazole (PROTONIX) 20 MG tablet Take 1 tablet (20 mg total) by mouth daily. (Patient taking  differently: Take 20 mg by mouth at bedtime. )  . potassium chloride SA (K-DUR,KLOR-CON) 20 MEQ tablet Take 1 tablet (20 mEq total) by mouth daily.  . saw palmetto 160 MG capsule Take 320 mg by mouth daily.   . tamsulosin (FLOMAX) 0.4 MG CAPS capsule Take 1 capsule (0.4 mg total) by mouth daily after supper.  . torsemide (DEMADEX) 20 MG tablet Take 2 tablets (40 mg total) by mouth 2 (two) times daily. (Patient taking differently: Take 20 mg by mouth 2 (two) times daily. )  . traZODone (DESYREL) 50 MG tablet Take 2 tablets (100 mg total) by mouth at bedtime.     Allergies  Allergen Reactions  . Gabapentin Other (See Comments)    Leg pain, weakness   . Metoprolol Swelling  . Spironolactone Other (See Comments)    Painful gynecomastia  . Amlodipine Other (See Comments)    Edema  . Other Other (See Comments)    Horse serum - myalgias  . Rosiglitazone Maleate Other (See Comments)    REACTION: Did not help  . Tricor [Fenofibrate] Other (See Comments)    myalgias    Social History   Socioeconomic History  . Marital status: Widowed    Spouse name: Not on file  . Number of children: Not on file  . Years of education: Not on file  . Highest education level: Not on file  Social Needs  . Financial resource strain: Not on file  . Food insecurity - worry: Not on file  . Food insecurity - inability: Not on file  . Transportation needs - medical: Not on file  . Transportation needs - non-medical: Not on file  Occupational History  . Not on file  Tobacco Use  . Smoking status: Former Smoker    Packs/day: 0.50    Years: 36.00    Pack years: 18.00    Types: Cigarettes    Last attempt to quit: 07/23/2013    Years since quitting: 4.4  . Smokeless tobacco: Never Used  Substance and Sexual Activity  . Alcohol use: No  . Drug use: No  . Sexual activity: Not Currently  Other Topics Concern  . Not on file  Social History Narrative   Caffeine: 4 cups coffee/day, some tea and soda    Lives with wife   Occupation: Retired   Activity: no regular exercise   Diet: good water, fruits/vegetables daily     Review of Systems: General: negative for chills, fever, night sweats or weight changes.  Cardiovascular: negative for chest pain, dyspnea on exertion, edema, orthopnea, palpitations, paroxysmal nocturnal dyspnea or shortness of breath Dermatological: negative for rash Respiratory: negative for cough or wheezing Urologic: negative for hematuria Abdominal: negative for nausea, vomiting, diarrhea, bright red blood per rectum, melena, or hematemesis Neurologic: negative for visual changes, syncope, or dizziness All other systems reviewed and are otherwise negative except as noted above.    Blood pressure 130/73, pulse 95, height 6\' 1"  (1.854 m), weight 208 lb 12.8 oz (94.7 kg).  General appearance: alert and no distress Neck: no adenopathy, no carotid bruit, no JVD, supple, symmetrical, trachea midline and thyroid not enlarged, symmetric, no tenderness/mass/nodules Lungs: clear to auscultation bilaterally Heart: 2/6 outflow tract murmur consistent with aortic stenosis. Extremities: extremities normal, atraumatic, no cyanosis or edema Pulses: 2+ and symmetric Skin: Skin color, texture, turgor normal. No rashes or lesions Neurologic: Alert and oriented X 3, normal strength and tone. Normal symmetric reflexes. Normal coordination and gait  EKG not performed today  ASSESSMENT AND PLAN:   HYPERCHOLESTEROLEMIA History of hyperlipidemia on statin therapy followed by his PCP  Essential hypertension History of essential hypertension blood pressure is 130/73. He is on lisinopril and carvedilol. Continue current meds at current dosing.  Coronary artery disease involving native coronary artery of native heart without angina pectoris History of CAD status post coronary artery bypass grafting June 2004 with a LIMA to his LAD, vein to the right coronary artery and obtuse marginal  branch. He recently underwent right and left heart cath by Dr. Angelena Form 10/20/17 revealing patent grafts with moderate aortic stenosis.he currently denies chest pain or shortness of breath.  Aortic stenosis, moderate Mr. Trombetta has moderate aortic stenosis demonstrated both by 2-D echo and right and left heart cath. He was being considered for T AVR by Dr. Burt Knack for his symptoms and degree of aortic stenosis did not support proceeding with this at this time.  Typical atrial flutter (HCC) History of atrial flutter status post cardioversion in the past with TEE guided by Dr. Oval Linsey currently on Eliquis oral anticoagulation.      [name] MD FACP,FACC,FAHA, [facility] 12/21/2017 12:08 PM

## 2017-12-21 NOTE — Assessment & Plan Note (Signed)
History of hyperlipidemia on statin therapy followed by his PCP 

## 2017-12-21 NOTE — Assessment & Plan Note (Signed)
History of atrial flutter status post cardioversion in the past with TEE guided by Dr. Oval Linsey currently on Eliquis oral anticoagulation.

## 2017-12-21 NOTE — Assessment & Plan Note (Signed)
History of essential hypertension blood pressure is 130/73. He is on lisinopril and carvedilol. Continue current meds at current dosing.

## 2017-12-31 ENCOUNTER — Other Ambulatory Visit: Payer: Self-pay | Admitting: Family Medicine

## 2018-01-12 ENCOUNTER — Other Ambulatory Visit: Payer: Self-pay | Admitting: Family Medicine

## 2018-01-16 ENCOUNTER — Other Ambulatory Visit: Payer: Self-pay | Admitting: Family Medicine

## 2018-01-17 ENCOUNTER — Ambulatory Visit (INDEPENDENT_AMBULATORY_CARE_PROVIDER_SITE_OTHER): Payer: Medicare Other | Admitting: Family Medicine

## 2018-01-17 ENCOUNTER — Encounter: Payer: Self-pay | Admitting: Family Medicine

## 2018-01-17 VITALS — BP 132/78 | HR 71 | Temp 97.3°F | Wt 206.0 lb

## 2018-01-17 DIAGNOSIS — E118 Type 2 diabetes mellitus with unspecified complications: Secondary | ICD-10-CM

## 2018-01-17 DIAGNOSIS — IMO0002 Reserved for concepts with insufficient information to code with codable children: Secondary | ICD-10-CM

## 2018-01-17 DIAGNOSIS — E1165 Type 2 diabetes mellitus with hyperglycemia: Secondary | ICD-10-CM

## 2018-01-17 DIAGNOSIS — N3000 Acute cystitis without hematuria: Secondary | ICD-10-CM

## 2018-01-17 LAB — POC URINALSYSI DIPSTICK (AUTOMATED)
Bilirubin, UA: NEGATIVE
Glucose, UA: NEGATIVE
KETONES UA: NEGATIVE
Nitrite, UA: NEGATIVE
PROTEIN UA: NEGATIVE
RBC UA: NEGATIVE
SPEC GRAV UA: 1.015 (ref 1.010–1.025)
UROBILINOGEN UA: 0.2 U/dL
pH, UA: 6 (ref 5.0–8.0)

## 2018-01-17 MED ORDER — CEPHALEXIN 500 MG PO CAPS: 500.0000 mg | ORAL_CAPSULE | Freq: Two times a day (BID) | ORAL | 0 refills | Status: DC

## 2018-01-17 NOTE — Progress Notes (Signed)
BP 132/78 (BP Location: Left Arm, Patient Position: Sitting, Cuff Size: Normal)   Pulse 71   Temp (!) 97.3 F (36.3 C) (Oral)   Wt 206 lb (93.4 kg)   SpO2 92%   BMI 27.18 kg/m    CC: "prostate is acting up" Subjective:    Patient ID: Charles [name], male    DOB: Nov 08, 1931, 84 y.o.   MRN: [id number]  HPI: Charles [name] is a 84 y.o. male presenting on 01/17/2018 for Pain (Pain in the prostate. Pt says it feels like he has a golf ball between his legs because it feels swollen. He also states that he has a high urinary frequency.)   Here with daughter.   1 wk h/o prostate discomfort, increased frequency, urgency, mild dysuria.  Wears depens diapers - having more accidents.  No fevers/chills, flank pain, abd pain or nausea/vomiting. No rectal pain or lower back pain.   On flomax and saw palmetto daily in known history of benign prostate enlargement.   Stable cardiac period followed regularly by Dr [name], last seen 12/2017. Latest note reviewed.   Relevant past medical, surgical, family and social history reviewed and updated as indicated. Interim medical history since our last visit reviewed. Allergies and medications reviewed and updated. Outpatient Medications Prior to Visit  Medication Sig Dispense Refill  . ACCU-CHEK AVIVA PLUS test strip CHECK BLOOD SUGAR THREE TIMES A DAY AND AS DIRECTED. 100 each 6  . acetaminophen (TYLENOL) 500 MG tablet Take 1 tablet (500 mg total) by mouth 3 (three) times daily. (Patient taking differently: Take 500 mg by mouth daily. )    . atorvastatin (LIPITOR) 20 MG tablet TAKE 1 TABLET DAILY. 90 tablet 0  . atorvastatin (LIPITOR) 20 MG tablet TAKE 1 TABLET DAILY. 90 tablet 0  . BAYER MICROLET LANCETS lancets Ck blood sugar three times daily and as directed. Dx 250.00. Insulin dependent 100 each 11  . citalopram (CELEXA) 20 MG tablet Take 1 tablet (20 mg total) by mouth daily. 90 tablet 0  . Cyanocobalamin (B-12) 500 MCG TABS Take 500 mcg by mouth  daily.     [name] ELIQUIS 5 MG TABS tablet Take 1 tablet (5 mg total) by mouth 2 (two) times daily. 60 tablet 6  . fluticasone (FLONASE) 50 MCG/ACT nasal spray Place 2 sprays into both nostrils daily.     . Insulin Syringe-Needle U-100 (B-D INS SYR ULTRAFINE 1CC/30G) 30G X 1/2" 1 ML MISC 1 Syringe by Does not apply route as directed. 100 each 3  . Insulin Syringe-Needle U-100 (B-D INS SYRINGE 0.5CC/30GX1/2") 30G X 1/2" 0.5 ML MISC Use as directed to inject insulin. Dx: E11.65 100 each 3  . LANTUS 100 UNIT/ML injection Inject 0.35 mLs (35 Units total) into the skin at bedtime. 10 mL 3  . lisinopril (PRINIVIL,ZESTRIL) 40 MG tablet Take 1 tablet (40 mg total) by mouth daily. 90 tablet 2  . metFORMIN (GLUCOPHAGE) 1000 MG tablet Take 1 tablet (1,000 mg total) by mouth 2 (two) times daily with a meal. 180 tablet 0  . pantoprazole (PROTONIX) 20 MG tablet Take 1 tablet (20 mg total) by mouth daily. (Patient taking differently: Take 20 mg by mouth at bedtime. ) 30 tablet 3  . potassium chloride SA (K-DUR,KLOR-CON) 20 MEQ tablet Take 1 tablet (20 mEq total) by mouth daily. 30 tablet 0  . saw palmetto 160 MG capsule Take 320 mg by mouth daily.     . tamsulosin (FLOMAX) 0.4 MG CAPS capsule Take 1  capsule (0.4 mg total) by mouth daily after supper. 30 capsule 0  . torsemide (DEMADEX) 20 MG tablet Take 2 tablets (40 mg total) by mouth 2 (two) times daily. (Patient taking differently: Take 20 mg by mouth 2 (two) times daily. ) 130 tablet 3  . traZODone (DESYREL) 50 MG tablet Take 2 tablets (100 mg total) by mouth at bedtime. 180 tablet 0  . carvedilol (COREG) 3.125 MG tablet Take 1 tablet (3.125 mg total) by mouth 2 (two) times daily. 180 tablet 3   No facility-administered medications prior to visit.      Per HPI unless specifically indicated in ROS section below Review of Systems     Objective:    BP 132/78 (BP Location: Left Arm, Patient Position: Sitting, Cuff Size: Normal)   Pulse 71   Temp (!) 97.3 F  (36.3 C) (Oral)   Wt 206 lb (93.4 kg)   SpO2 92%   BMI 27.18 kg/m   Wt Readings from Last 3 Encounters:  01/17/18 206 lb (93.4 kg)  12/21/17 208 lb 12.8 oz (94.7 kg)  10/20/17 218 lb (98.9 kg)    Physical Exam  Constitutional: He appears well-developed and well-nourished. No distress.  Walks with walker  HENT:  Head: Normocephalic and atraumatic.  Mouth/Throat: Oropharynx is clear and moist. No oropharyngeal exudate.  Cardiovascular: Normal rate and regular rhythm.  Murmur (3/6 systolic) heard. Pulmonary/Chest: Effort normal and breath sounds normal. No stridor. No respiratory distress. He has no wheezes. He has no rales.  Abdominal: Soft. Bowel sounds are normal. He exhibits no distension and no mass. There is no hepatosplenomegaly. There is no tenderness. There is no rebound, no guarding and no CVA tenderness. No hernia.  Musculoskeletal: He exhibits no edema.  Nursing note and vitals reviewed.    Results for orders placed or performed in visit on 01/17/18  POCT Urinalysis Dipstick (Automated)  Result Value Ref Range   Color, UA yellow    Clarity, UA clear    Glucose, UA negative    Bilirubin, UA negative    Ketones, UA negative    Spec Grav, UA 1.015 1.010 - 1.025   Blood, UA negative    pH, UA 6.0 5.0 - 8.0   Protein, UA negative    Urobilinogen, UA 0.2 0.2 or 1.0 E.U./dL   Nitrite, UA negative    Leukocytes, UA Moderate (2+) (A) Negative    Lab Results  Component Value Date   CREATININE 1.14 10/14/2017   BUN 25 10/14/2017   NA 142 10/14/2017   K 4.8 10/14/2017   CL 97 10/14/2017   CO2 31 (H) 10/14/2017   No results found for: PSA     Assessment & Plan:   Problem List Items Addressed This Visit    Acute cystitis without hematuria - Primary    UA today suspicious for infection. Rx keflex 1wk course. Supportive care as per instructions. Update if not improving with treatment UCx sent today.       Relevant Orders   Urine Culture   POCT Urinalysis Dipstick  (Automated) (Completed)   Diabetes mellitus type 2, uncontrolled, with complications ([place])    Encouraged he return for DM check.           Meds ordered this encounter  Medications  . cephALEXin (KEFLEX) 500 MG capsule    Sig: Take 1 capsule (500 mg total) by mouth 2 (two) times daily.    Dispense:  14 capsule    Refill:  0  Orders Placed This Encounter  Procedures  . Urine Culture  . POCT Urinalysis Dipstick (Automated)    Follow up plan: Return if symptoms worsen or fail to improve.  [name], MD

## 2018-01-17 NOTE — Assessment & Plan Note (Signed)
Encouraged he return for DM check.

## 2018-01-17 NOTE — Assessment & Plan Note (Signed)
UA today suspicious for infection. Rx keflex 1wk course. Supportive care as per instructions. Update if not improving with treatment UCx sent today.

## 2018-01-17 NOTE — Patient Instructions (Signed)
I do think you have urine infection. Treat with keflex 500mg  7 day course. Push fluids to stay well hydrated. Let us know if not improving with treatment. Urine culture sent off as well.   Urinary Tract Infection, Adult A urinary tract infection (UTI) is an infection of any part of the urinary tract. The urinary tract includes the:  Kidneys.  Ureters.  Bladder.  Urethra.  These organs make, store, and get rid of pee (urine) in the body. Follow these instructions at home:  Take over-the-counter and prescription medicines only as told by your doctor.  If you were prescribed an antibiotic medicine, take it as told by your doctor. Do not stop taking the antibiotic even if you start to feel better.  Avoid the following drinks: ? Alcohol. ? Caffeine. ? Tea. ? Carbonated drinks.  Drink enough fluid to keep your pee clear or pale yellow.  Keep all follow-up visits as told by your doctor. This is important.  Make sure to: ? Empty your bladder often and completely. Do not to hold pee for long periods of time. ? Empty your bladder before and after sex. ? Wipe from front to back after a bowel movement if you are male. Use each tissue one time when you wipe. Contact a doctor if:  You have back pain.  You have a fever.  You feel sick to your stomach (nauseous).  You throw up (vomit).  Your symptoms do not get better after 3 days.  Your symptoms go away and then come back. Get help right away if:  You have very bad back pain.  You have very bad lower belly (abdominal) pain.  You are throwing up and cannot keep down any medicines or water. This information is not intended to replace advice given to you by your health care provider. Make sure you discuss any questions you have with your health care provider. Document Released: 03/08/2008 Document Revised: 02/26/2016 Document Reviewed: 08/11/2015 Elsevier Interactive Patient Education  [employer].

## 2018-01-18 LAB — URINE CULTURE
MICRO NUMBER: 90467124
SPECIMEN QUALITY: ADEQUATE

## 2018-01-23 ENCOUNTER — Other Ambulatory Visit: Payer: Self-pay | Admitting: Family Medicine

## 2018-01-23 NOTE — Telephone Encounter (Signed)
Electronic refill request Last office visit 01/17/18 Last refill 07/08/17 #180

## 2018-01-31 ENCOUNTER — Ambulatory Visit (INDEPENDENT_AMBULATORY_CARE_PROVIDER_SITE_OTHER): Payer: Medicare Other | Admitting: Family Medicine

## 2018-01-31 ENCOUNTER — Encounter: Payer: Self-pay | Admitting: Family Medicine

## 2018-01-31 VITALS — BP 124/66 | HR 74 | Temp 97.9°F | Ht 73.0 in | Wt 204.0 lb

## 2018-01-31 DIAGNOSIS — N410 Acute prostatitis: Secondary | ICD-10-CM

## 2018-01-31 DIAGNOSIS — R300 Dysuria: Secondary | ICD-10-CM

## 2018-01-31 DIAGNOSIS — N289 Disorder of kidney and ureter, unspecified: Secondary | ICD-10-CM | POA: Diagnosis not present

## 2018-01-31 LAB — POC URINALSYSI DIPSTICK (AUTOMATED)
Bilirubin, UA: NEGATIVE
Blood, UA: NEGATIVE
GLUCOSE UA: NEGATIVE
Ketones, UA: NEGATIVE
NITRITE UA: NEGATIVE
Protein, UA: NEGATIVE
Spec Grav, UA: 1.015 (ref 1.010–1.025)
UROBILINOGEN UA: 0.2 U/dL
pH, UA: 6 (ref 5.0–8.0)

## 2018-01-31 MED ORDER — CIPROFLOXACIN HCL 250 MG PO TABS: 250.0000 mg | ORAL_TABLET | Freq: Two times a day (BID) | ORAL | 0 refills | Status: DC

## 2018-01-31 NOTE — Progress Notes (Signed)
BP 124/66 (BP Location: Right Arm, Patient Position: Sitting, Cuff Size: Normal)   Pulse 74   Temp 97.9 F (36.6 C) (Oral)   Ht 6\' 1"  (1.854 m)   Wt 204 lb (92.5 kg)   SpO2 95%   BMI 26.91 kg/m    CC: ongoing prostate trouble Subjective:    Patient ID: Charles [name], male    DOB: 1932-08-21, 84 y.o.   MRN: [id number]  HPI: Charles [name] is a 84 y.o. male presenting on 01/31/2018 for Prostate concerns (Feels like he has golf ball between his legs. Takes awhile for stream to start when urinating and then only dribbles. Has had sxs 3-4 wks.  Pt's daughter-in-law, [name], is with him today but is in lab waiting area due to context of today's visit. ) and Dysuria   See prior note for details. Seen here 2 wks ago with 1wk of prostate symptoms presumed acute cystitis treated with 1 wk keflex course. UCx returned without significant infection. No improvement with or while on keflex.   Worsening incomplete emptying, feels "golf ball" between legs. Intermittent dysuria, frequency, urgency, increased accidents.   On flomax and saw palmetto daily in known history of benign prostate enlargement. Has never taken finasteride.   Relevant past medical, surgical, family and social history reviewed and updated as indicated. Interim medical history since our last visit reviewed. Allergies and medications reviewed and updated. Outpatient Medications Prior to Visit  Medication Sig Dispense Refill  . ACCU-CHEK AVIVA PLUS test strip CHECK BLOOD SUGAR THREE TIMES A DAY AND AS DIRECTED. 100 each 6  . acetaminophen (TYLENOL) 500 MG tablet Take 1 tablet (500 mg total) by mouth 3 (three) times daily. (Patient taking differently: Take 500 mg by mouth daily. )    . atorvastatin (LIPITOR) 20 MG tablet TAKE 1 TABLET DAILY. 90 tablet 0  . BAYER MICROLET LANCETS lancets Ck blood sugar three times daily and as directed. Dx 250.00. Insulin dependent 100 each 11  . citalopram (CELEXA) 20 MG tablet Take 1 tablet (20  mg total) by mouth daily. 90 tablet 0  . Cyanocobalamin (B-12) 500 MCG TABS Take 500 mcg by mouth daily.     [name] ELIQUIS 5 MG TABS tablet Take 1 tablet (5 mg total) by mouth 2 (two) times daily. 60 tablet 6  . fluticasone (FLONASE) 50 MCG/ACT nasal spray Place 2 sprays into both nostrils daily.     . Insulin Syringe-Needle U-100 (B-D INS SYR ULTRAFINE 1CC/30G) 30G X 1/2" 1 ML MISC 1 Syringe by Does not apply route as directed. 100 each 3  . Insulin Syringe-Needle U-100 (B-D INS SYRINGE 0.5CC/30GX1/2") 30G X 1/2" 0.5 ML MISC Use as directed to inject insulin. Dx: E11.65 100 each 3  . LANTUS 100 UNIT/ML injection Inject 0.35 mLs (35 Units total) into the skin at bedtime. 10 mL 3  . lisinopril (PRINIVIL,ZESTRIL) 40 MG tablet Take 1 tablet (40 mg total) by mouth daily. 90 tablet 2  . metFORMIN (GLUCOPHAGE) 1000 MG tablet Take 1 tablet (1,000 mg total) by mouth 2 (two) times daily with a meal. 180 tablet 0  . pantoprazole (PROTONIX) 20 MG tablet Take 1 tablet (20 mg total) by mouth daily. (Patient taking differently: Take 20 mg by mouth at bedtime. ) 30 tablet 3  . potassium chloride SA (K-DUR,KLOR-CON) 20 MEQ tablet Take 1 tablet (20 mEq total) by mouth daily. 30 tablet 0  . saw palmetto 160 MG capsule Take 320 mg by mouth daily.     [name]  tamsulosin (FLOMAX) 0.4 MG CAPS capsule Take 1 capsule (0.4 mg total) by mouth daily after supper. 30 capsule 0  . torsemide (DEMADEX) 20 MG tablet Take 2 tablets (40 mg total) by mouth 2 (two) times daily. (Patient taking differently: Take 20 mg by mouth 2 (two) times daily. ) 130 tablet 3  . traZODone (DESYREL) 50 MG tablet Take 2 tablets (100 mg total) by mouth at bedtime. 180 tablet 0  . atorvastatin (LIPITOR) 20 MG tablet TAKE 1 TABLET DAILY. 90 tablet 0  . carvedilol (COREG) 3.125 MG tablet Take 1 tablet (3.125 mg total) by mouth 2 (two) times daily. 180 tablet 3  . cephALEXin (KEFLEX) 500 MG capsule Take 1 capsule (500 mg total) by mouth 2 (two) times daily. 14  capsule 0   No facility-administered medications prior to visit.      Per HPI unless specifically indicated in ROS section below Review of Systems     Objective:    BP 124/66 (BP Location: Right Arm, Patient Position: Sitting, Cuff Size: Normal)   Pulse 74   Temp 97.9 F (36.6 C) (Oral)   Ht 6\' 1"  (1.854 m)   Wt 204 lb (92.5 kg)   SpO2 95%   BMI 26.91 kg/m   Wt Readings from Last 3 Encounters:  01/31/18 204 lb (92.5 kg)  01/17/18 206 lb (93.4 kg)  12/21/17 208 lb 12.8 oz (94.7 kg)    Physical Exam  Constitutional: He appears well-developed and well-nourished. No distress.  HENT:  Mouth/Throat: Oropharynx is clear and moist. No oropharyngeal exudate.  Cardiovascular: Normal rate. An irregular rhythm present.  Murmur (3/6 systolic) heard. Pulmonary/Chest: Effort normal and breath sounds normal. No respiratory distress. He has no wheezes. He has no rales.  Abdominal: Soft. Bowel sounds are normal. He exhibits no distension and no mass. There is no hepatosplenomegaly. There is no tenderness. There is no rebound, no guarding and no CVA tenderness. No hernia.  Genitourinary: Rectum normal, testes normal and penis normal. Rectal exam shows no external hemorrhoid, no internal hemorrhoid, no fissure, no mass, no tenderness and anal tone normal. Prostate is tender (painful to palpation, not boggy). Prostate is not enlarged. Right testis shows no mass and no tenderness. Left testis shows no mass and no tenderness. Uncircumcised. No phimosis or penile erythema. No discharge found.  Genitourinary Comments: Perianal erythema  Nursing note and vitals reviewed.  Results for orders placed or performed in visit on 01/31/18  POCT Urinalysis Dipstick (Automated)  Result Value Ref Range   Color, UA yellow    Clarity, UA clear    Glucose, UA negative    Bilirubin, UA negative    Ketones, UA negative    Spec Grav, UA 1.015 1.010 - 1.025   Blood, UA negative    pH, UA 6.0 5.0 - 8.0   Protein,  UA negative    Urobilinogen, UA 0.2 0.2 or 1.0 E.U./dL   Nitrite, UA negative    Leukocytes, UA Large (3+) (A) Negative   Lab Results  Component Value Date   CREATININE 1.14 10/14/2017       Assessment & Plan:   Problem List Items Addressed This Visit    Prostatitis - Primary    Anticipate ongoing acute prostatitis, worsening despite recent keflex course to treat presumed cystitis. Rx cipro 250mg  bid x 6 wks. Discussed tendinopathy/tendon rupture concerns. Continue flomax and saw palmetto. No obvious BPH on exam.       Relevant Orders   Urine Culture  Renal insufficiency    Other Visit Diagnoses    Dysuria       Relevant Orders   POCT Urinalysis Dipstick (Automated) (Completed)       Meds ordered this encounter  Medications  . ciprofloxacin (CIPRO) 250 MG tablet    Sig: Take 1 tablet (250 mg total) by mouth 2 (two) times daily.    Dispense:  84 tablet    Refill:  0   Orders Placed This Encounter  Procedures  . Urine Culture  . POCT Urinalysis Dipstick (Automated)    Follow up plan: Return if symptoms worsen or fail to improve.  [name], MD

## 2018-01-31 NOTE — Patient Instructions (Addendum)
I think you have prostate infection. Treat with cipro 250mg  twice daily for 6 weeks. Let us know if not improving with treatment. Urine culture sent today. If any tendon pain please stop cipro and let us know.

## 2018-01-31 NOTE — Assessment & Plan Note (Signed)
Anticipate ongoing acute prostatitis, worsening despite recent keflex course to treat presumed cystitis. Rx cipro 250mg  bid x 6 wks. Discussed tendinopathy/tendon rupture concerns. Continue flomax and saw palmetto. No obvious BPH on exam.

## 2018-02-02 LAB — URINE CULTURE
MICRO NUMBER:: 90525262
SPECIMEN QUALITY:: ADEQUATE

## 2018-02-13 ENCOUNTER — Telehealth: Payer: Self-pay

## 2018-02-13 ENCOUNTER — Other Ambulatory Visit: Payer: Self-pay | Admitting: Family Medicine

## 2018-02-13 NOTE — Telephone Encounter (Signed)
I don't see PSA done recently- it makes sense not to do this since he would likely have a false elevation.  It sounds like he needs recheck.  Please notify pt and schedule.  Thanks.

## 2018-02-13 NOTE — Telephone Encounter (Signed)
Per office note on 01/31/18; pts symptoms were prostate concerns:  (Feels like he has golf ball between his legs. Takes awhile for stream to start when urinating and then only dribbles. Has had sxs 3-4 wks.  Pt's daughter-in-law, [name], is with him today but is in lab waiting area due to context of today's visit. ) and Dysuria

## 2018-02-13 NOTE — Telephone Encounter (Signed)
Copied from [place] [phone]. Topic: Inquiry >> Feb 13, 2018  2:05 PM Scherrie Gerlach wrote: Reason for CRM: tara the daughter in law calling back to advise pt is not any better, still has same sx as when she brought him in on 01/31/18. She wanted to know if pt had a PSA done that day [name] is not on the [facility]. Advised could not call her back and give any information. [name] says ok to call her husband or the pt at home. Regardless, pt not better and they need to know what to do next

## 2018-02-14 NOTE — Telephone Encounter (Signed)
Patient notified as instructed by telephone and verbalized understanding. Patient request that I call his daughter-in-law [name] and set up the appointment since she would be the one to bring him. Spoke to [place] and appointment scheduled as instructed.

## 2018-02-15 ENCOUNTER — Other Ambulatory Visit: Payer: Self-pay | Admitting: Family Medicine

## 2018-02-15 ENCOUNTER — Encounter: Payer: Self-pay | Admitting: Family Medicine

## 2018-02-15 ENCOUNTER — Ambulatory Visit (INDEPENDENT_AMBULATORY_CARE_PROVIDER_SITE_OTHER): Payer: Medicare Other | Admitting: Family Medicine

## 2018-02-15 VITALS — BP 130/78 | HR 69 | Temp 97.9°F | Ht 73.0 in | Wt 208.2 lb

## 2018-02-15 DIAGNOSIS — R300 Dysuria: Secondary | ICD-10-CM

## 2018-02-15 DIAGNOSIS — N410 Acute prostatitis: Secondary | ICD-10-CM

## 2018-02-15 DIAGNOSIS — N5089 Other specified disorders of the male genital organs: Secondary | ICD-10-CM | POA: Diagnosis not present

## 2018-02-15 LAB — POC URINALSYSI DIPSTICK (AUTOMATED)
Bilirubin, UA: NEGATIVE
Blood, UA: NEGATIVE
GLUCOSE UA: NEGATIVE
KETONES UA: NEGATIVE
Nitrite, UA: NEGATIVE
Protein, UA: NEGATIVE
SPEC GRAV UA: 1.015 (ref 1.010–1.025)
UROBILINOGEN UA: 0.2 U/dL
pH, UA: 6 (ref 5.0–8.0)

## 2018-02-15 MED ORDER — FINASTERIDE 5 MG PO TABS: 5.0000 mg | ORAL_TABLET | Freq: Every day | ORAL | 3 refills | Status: DC

## 2018-02-15 NOTE — Progress Notes (Signed)
BP 130/78 (BP Location: Left Arm, Patient Position: Sitting, Cuff Size: Normal)   Pulse 69   Temp 97.9 F (36.6 C) (Oral)   Ht 6\' 1"  (1.854 m)   Wt 208 lb 4 oz (94.5 kg)   SpO2 94%   BMI 27.48 kg/m    CC: prostatitis f/u Subjective:    Patient ID: Charles [name], male    DOB: [birth year], 84 y.o.   MRN: [id number]  HPI: Charles [name] is a 84 y.o. male presenting on 02/15/2018 for Dysuria (Still c/o intermittent pain with uriniation and nocturia. Say bladder still does not empty completely. Does not feel abx is helping. Feels like he is sitting on a golf ball.  Pt is accompanied by daughter-in-law. )   See recent office notes for details. Ongoing symptoms despite initial treatment with keflex 500mg  bid 1wk course then started on cipro 250mg  BID x 6 wks.  Last visit on prostate exam he did have tender but not boggy prostate, without induration.   Ongoing incomplete emptying of bladder despite flomax and saw palmetto. Ongoing nocturia x5. Ongoing dysuria, urgency, frequency. Ongoing pressure in perineum. "Feel like I'm sitting on a golf ball".  No fevers/chills, nausea, flank pain or abd pain.  Wears pull ups .   Known history of benign prostate enlargement. Has never taken finasteride.   UAs always with large leukocytes. Initial UCx 01/17/2018 - insignificant growth.  Rpt UCx 01/31/2018 - multiple bacterial morphotypes  No results found for: PSA    Relevant past medical, surgical, family and social history reviewed and updated as indicated. Interim medical history since our last visit reviewed. Allergies and medications reviewed and updated. Outpatient Medications Prior to Visit  Medication Sig Dispense Refill  . ACCU-CHEK AVIVA PLUS test strip CHECK BLOOD SUGAR THREE TIMES A DAY AND AS DIRECTED. 100 each 6  . acetaminophen (TYLENOL) 500 MG tablet Take 1 tablet (500 mg total) by mouth 3 (three) times daily. (Patient taking differently: Take 500 mg by mouth daily. )    .  atorvastatin (LIPITOR) 20 MG tablet TAKE 1 TABLET DAILY. 90 tablet 0  . BAYER MICROLET LANCETS lancets Ck blood sugar three times daily and as directed. 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 Insulin dependent 100 each 11  . ciprofloxacin (CIPRO) 250 MG tablet Take 1 tablet (250 mg total) by mouth 2 (two) times daily. 84 tablet 0  . citalopram (CELEXA) 20 MG tablet Take 1 tablet (20 mg total) by mouth daily. 90 tablet 0  . Cyanocobalamin (B-12) 500 MCG TABS Take 500 mcg by mouth daily.     [name] ELIQUIS 5 MG TABS tablet Take 1 tablet (5 mg total) by mouth 2 (two) times daily. 60 tablet 6  . fluticasone (FLONASE) 50 MCG/ACT nasal spray Place 2 sprays into both nostrils daily.     . Insulin Syringe-Needle U-100 (B-D INS SYR ULTRAFINE 1CC/30G) 30G X 1/2" 1 ML MISC 1 Syringe by Does not apply route as directed. 100 each 3  . Insulin Syringe-Needle U-100 (B-D INS SYRINGE 0.5CC/30GX1/2") 30G X 1/2" 0.5 ML MISC Use as directed to inject insulin. Dx: E11.65 100 each 3  . LANTUS 100 UNIT/ML injection Inject 0.35 mLs (35 Units total) into the skin at bedtime. 10 mL 3  . lisinopril (PRINIVIL,ZESTRIL) 40 MG tablet Take 1 tablet (40 mg total) by mouth daily. 90 tablet 2  . metFORMIN (GLUCOPHAGE) 1000 MG tablet Take 1 tablet (1,000 mg total) by mouth 2 (two) times daily with a meal. 180 tablet 0  .  pantoprazole (PROTONIX) 20 MG tablet Take 1 tablet (20 mg total) by mouth daily. 30 tablet 6  . potassium chloride SA (K-DUR,KLOR-CON) 20 MEQ tablet Take 1 tablet (20 mEq total) by mouth daily. 30 tablet 0  . saw palmetto 160 MG capsule Take 320 mg by mouth daily.     . tamsulosin (FLOMAX) 0.4 MG CAPS capsule Take 1 capsule (0.4 mg total) by mouth daily after supper. 30 capsule 0  . torsemide (DEMADEX) 20 MG tablet Take 2 tablets (40 mg total) by mouth 2 (two) times daily. (Patient taking differently: Take 20 mg by mouth 2 (two) times daily. ) 130 tablet 3  . traZODone (DESYREL) 50 MG tablet Take 2 tablets (100 mg total) by mouth at bedtime. 180  tablet 0  . carvedilol (COREG) 3.125 MG tablet Take 1 tablet (3.125 mg total) by mouth 2 (two) times daily. 180 tablet 3   No facility-administered medications prior to visit.      Per HPI unless specifically indicated in ROS section below Review of Systems     Objective:    BP 130/78 (BP Location: Left Arm, Patient Position: Sitting, Cuff Size: Normal)   Pulse 69   Temp 97.9 F (36.6 C) (Oral)   Ht 6\' 1"  (1.854 m)   Wt 208 lb 4 oz (94.5 kg)   SpO2 94%   BMI 27.48 kg/m   Wt Readings from Last 3 Encounters:  02/15/18 208 lb 4 oz (94.5 kg)  01/31/18 204 lb (92.5 kg)  01/17/18 206 lb (93.4 kg)    Physical Exam  Constitutional: He appears well-developed and well-nourished. No distress.  Abdominal: Hernia confirmed negative in the right inguinal area and confirmed negative in the left inguinal area.  Genitourinary: Penis normal. Right testis shows swelling and tenderness. Right testis is descended. Left testis shows no mass, no swelling and no tenderness. Left testis is descended. Uncircumcised. No penile erythema. No discharge found.  Genitourinary Comments: New tender R epididymal swelling/mass  Lymphadenopathy: No inguinal adenopathy noted on the right or left side.  Nursing note and vitals reviewed.  Results for orders placed or performed in visit on 02/15/18  POCT Urinalysis Dipstick (Automated)  Result Value Ref Range   Color, UA yellow    Clarity, UA clear    Glucose, UA negative    Bilirubin, UA negative    Ketones, UA negative    Spec Grav, UA 1.015 1.010 - 1.025   Blood, UA negative    pH, UA 6.0 5.0 - 8.0   Protein, UA negative    Urobilinogen, UA 0.2 0.2 or 1.0 E.U./dL   Nitrite, UA negative    Leukocytes, UA Large (3+) (A) Negative      Assessment & Plan:   Problem List Items Addressed This Visit    Prostatitis - Primary    UA remains with large LE, however last 2 UCx without significant infection - will not repeat UCx today. Presumed chronic prostatitis  in h/o BPH. No improvement with first keflex 1wk, now on cipro for the last 2 weeks without significant improvement. Check baseline PSA. Discussed trial finasteride and urology referral as next step. He wants to try finasteride, declines urology eval at this time. See below. Will let me know if agrees to uro eval.       Relevant Orders   PSA   Scrotal swelling    New right sided epididymal swelling, not appreciated 2 wks ago. ?epididymitis - but this should be improving with cipro course. Will  check scrotal ultrasound for further evaluation. Pt agrees with plan.      Relevant Orders   US SCROTUM W/DOPPLER    Other Visit Diagnoses    Dysuria       Relevant Orders   POCT Urinalysis Dipstick (Automated) (Completed)       Meds ordered this encounter  Medications  . finasteride (PROSCAR) 5 MG tablet    Sig: Take 1 tablet (5 mg total) by mouth daily.    Dispense:  30 tablet    Refill:  3   Orders Placed This Encounter  Procedures  . US SCROTUM W/DOPPLER    Standing Status:   Future    Standing Expiration Date:   04/18/2019    Order Specific Question:   Reason for Exam (SYMPTOM  OR DIAGNOSIS REQUIRED)    Answer:   R epididymal pain/swelling    Order Specific Question:   Preferred imaging location?    Answer:   [facility]  . PSA  . POCT Urinalysis Dipstick (Automated)    Follow up plan: No follow-ups on file.  [name], MD

## 2018-02-15 NOTE — Patient Instructions (Addendum)
PSA today Continue cipro. Continue flomax.  Add on finasteride Update me in 2 weeks with effect of finasteride - if no improvement we will refer you to urology.  You have new swelling of the epididymis on the right - we will check scrotal ultrasound for this - see Rosaria Ferries to schedule this.

## 2018-02-16 ENCOUNTER — Ambulatory Visit
Admission: RE | Admit: 2018-02-16 | Discharge: 2018-02-16 | Disposition: A | Discharge status: Home / Self Care | Payer: Medicare Other | Source: Ambulatory Visit | Attending: Family Medicine | Admitting: Family Medicine

## 2018-02-16 ENCOUNTER — Encounter: Payer: Self-pay | Admitting: Family Medicine

## 2018-02-16 DIAGNOSIS — N5089 Other specified disorders of the male genital organs: Secondary | ICD-10-CM | POA: Diagnosis present

## 2018-02-16 DIAGNOSIS — N4342 Spermatocele of epididymis, multiple: Secondary | ICD-10-CM | POA: Insufficient documentation

## 2018-02-16 DIAGNOSIS — N433 Hydrocele, unspecified: Secondary | ICD-10-CM | POA: Diagnosis not present

## 2018-02-16 LAB — PSA: PSA: 0.87 ng/mL (ref 0.10–4.00)

## 2018-02-16 NOTE — Assessment & Plan Note (Addendum)
UA remains with large LE, however last 2 UCx without significant infection - will not repeat UCx today. Presumed chronic prostatitis in h/o BPH. No improvement with first keflex 1wk, now on cipro for the last 2 weeks without significant improvement. Check baseline PSA. Discussed trial finasteride and urology referral as next step. He wants to try finasteride, declines urology eval at this time. See below. Will let me know if agrees to uro eval.

## 2018-02-16 NOTE — Assessment & Plan Note (Signed)
New right sided epididymal swelling, not appreciated 2 wks ago. ?epididymitis - but this should be improving with cipro course. Will check scrotal ultrasound for further evaluation. Pt agrees with plan.

## 2018-02-24 ENCOUNTER — Other Ambulatory Visit: Payer: Self-pay | Admitting: Family Medicine

## 2018-03-06 ENCOUNTER — Other Ambulatory Visit: Payer: Self-pay | Admitting: Family Medicine

## 2018-03-06 ENCOUNTER — Telehealth: Payer: Self-pay | Admitting: Cardiovascular Disease

## 2018-03-06 NOTE — Telephone Encounter (Signed)
New Message:       Pt's son is calling to give [place] an update on his father. Pt had an episode this weekend but wouldn't allow son to call the ambulance.

## 2018-03-06 NOTE — Telephone Encounter (Signed)
Returned call to patient's son he stated father had a episode of chest discomfort this past Sat that lasted appox 10 min.Stated no pain since, but he would like father to be seen this pain was different.Appointment scheduled with Almyra Deforest PA 03/08/18 at 2:00 pm.

## 2018-03-08 ENCOUNTER — Ambulatory Visit: Payer: Medicare Other | Admitting: Physician Assistant

## 2018-03-08 ENCOUNTER — Encounter: Payer: Self-pay | Admitting: Physician Assistant

## 2018-03-08 VITALS — BP 122/82 | HR 83 | Ht 73.0 in | Wt 206.0 lb

## 2018-03-08 DIAGNOSIS — E119 Type 2 diabetes mellitus without complications: Secondary | ICD-10-CM | POA: Diagnosis not present

## 2018-03-08 DIAGNOSIS — E785 Hyperlipidemia, unspecified: Secondary | ICD-10-CM

## 2018-03-08 DIAGNOSIS — I25810 Atherosclerosis of coronary artery bypass graft(s) without angina pectoris: Secondary | ICD-10-CM | POA: Diagnosis not present

## 2018-03-08 DIAGNOSIS — I779 Disorder of arteries and arterioles, unspecified: Secondary | ICD-10-CM

## 2018-03-08 DIAGNOSIS — I350 Nonrheumatic aortic (valve) stenosis: Secondary | ICD-10-CM

## 2018-03-08 DIAGNOSIS — I4892 Unspecified atrial flutter: Secondary | ICD-10-CM | POA: Diagnosis not present

## 2018-03-08 DIAGNOSIS — I701 Atherosclerosis of renal artery: Secondary | ICD-10-CM

## 2018-03-08 DIAGNOSIS — I739 Peripheral vascular disease, unspecified: Secondary | ICD-10-CM

## 2018-03-08 DIAGNOSIS — I10 Essential (primary) hypertension: Secondary | ICD-10-CM | POA: Diagnosis not present

## 2018-03-08 MED ORDER — NITROGLYCERIN 0.4 MG SL SUBL: 0.4000 mg | SUBLINGUAL_TABLET | SUBLINGUAL | 3 refills | Status: AC | PRN

## 2018-03-08 NOTE — Progress Notes (Signed)
Cardiology Office Note    Date:  03/10/2018   ID:  Charles [name], DOB 1932/09/30, MRN [id number]  PCP:  [name], MD  Cardiologist:  Dr. [name]  Chief Complaint  Patient presents with  . Follow-up    since last Friday, had nausea, SOB noted, denies swelling in hands/feet    History of Present Illness:  Charles [name] is a 84 y.o. male with PMH of aortic stenosis, atrial flutter, history of Mobitz 1 AV block, history of branch retinal vein occlusion, CAD s/p CABG in 2004 with LIMA to LAD, SVG to RCA, SVG to OM, SVG to RI, carotid artery disease s/p left CEA, COPD, GERD, hypertension, hyperlipidemia, renal artery stenosis and DM II.  He has a 70% bilateral renal artery stenosis.  He previously had DCCV for atrial fibrillation in September 2016.  He was placed on Eliquis however ultimately decided not to take this drug for various reason.  Echocardiogram performed in July 2016 was also significant for moderate to severe aortic stenosis with valve area of 0.9 cm.  Last TEE guided DCCV was in January 2018.  He was evaluated by Dr. Burt Knack for TAVR in January 2019 and underwent left and right heart cath by Dr. Angelena Form a week later revealing patent grafts with an most moderate aortic stenosis.  Therefore the plan for TAVR is canceled at this time.  Patient presents today for evaluation of chest pain.  He had a similar episode of chest pain last Friday, this lasted only about 7 minutes before resolving.  Since then, he did notice some dyspnea on exertion but denies any further chest discomfort.  EKG today has been reviewed with Dr. [name], there has been no significant change.  Upon further review of the previous cardiac catheterization in January 2019, there was no significant disease in any of the bypass graft.  Suspicion for ACS relatively low in this case.  We recommend continue observation.  Given the fact that his aortic stenosis is only moderate, he should be able to take nitroglycerin  on as-needed basis.  I will obtain a basic metabolic panel and BNP to rule out electrolyte shift and volume overload.  Otherwise he does not appears to be volume overloaded on physical exam, he does not have any lower extremity edema, orthopnea or PND.   Past Medical History:  Diagnosis Date  . Abdominal aortic atherosclerosis ([place])    by xray  . Aortic stenosis 06/2014   moderate-severe  . Arthritis    in lower back (06/24/2015)  . Atrial flutter ([place])    notes 06/24/2015  . AV block, Mobitz 1 09/06/2017   Archie Endo 09/06/2017  . 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 CAD (coronary artery disease) 2004   a. s/p CABG in 2004 with LIMA-LAD, SVG-RCA, SVG-OM, and SVG-RI  . Carotid stenosis 1999   s/p L CEA  . CHF (congestive heart failure) ([place]) dx'd 06/2015   a. EF 55-60% by echo in 2016. b. 10/2016: EF reduced to 30-35% by TEE in the setting of atrial fibrillation with RVR  . Colon polyp 2005   (Dr. Tiffany Kocher)  . Compression fracture of L1 lumbar vertebra (HCC) remote  . COPD (chronic obstructive pulmonary disease) ([place]) 03/2011   by xray  . Depression    hx  . Ex-smoker   . GERD (gastroesophageal reflux disease) 2003   h/o duodenal ulcer per EGD as well as esophagitis  . History of colon polyps 2003, 2005  adenomatous Vira Agar)  . HLD (hyperlipidemia)   . HTN (hypertension)   . Psoriasis   . Renal artery stenosis ([place]) 2004   70% bilateral, followed by cards  . T2DM (type 2 diabetes mellitus) ([place]) 1995  . Urge incontinence of urine     Past Surgical History:  Procedure Laterality Date  . CARDIAC CATHETERIZATION  03/06/2003   No intervention - recommend CABG  . CARDIOVASCULAR STRESS TEST  11/2010   normal perfusion, no evidence of ischemia, EF 62% post exercise  . CARDIOVASCULAR STRESS TEST  11/28/2012   Mild diaphragmatic attenuation; cannot exclude a focal region of nontransmural inferior scar  . CARDIOVERSION N/A 06/25/2015   Procedure:  CARDIOVERSION;  Surgeon: Pixie Casino, MD;  Location[facility] ENDOSCOPY;  Service: Cardiovascular;  Laterality: N/A;  . CARDIOVERSION N/A 11/02/2016   Procedure: CARDIOVERSION;  Surgeon: [name], MD;  Location: [place];  Service: Cardiovascular;  Laterality: N/A;  . CAROTID ENDARTERECTOMY Left 1999   ([place])  . CATARACT EXTRACTION W/ INTRAOCULAR LENS  IMPLANT, BILATERAL Bilateral 01/2013   [place]  . COLONOSCOPY  2003   colon polyp x3 - adenomatous Tiffany Kocher)  . COLONOSCOPY  10/08/2012   2 TA, diverticulosis, int hem, no rpt rec Tiffany Kocher)  . CORONARY ANGIOPLASTY    . CORONARY ARTERY BYPASS GRAFT  03/07/2003   4v CABG ([place]) with LIMA to LAD, vein graft to RCA, 1st obtuse marginal, and ramus intermedius  . ESOPHAGOGASTRODUODENOSCOPY  10/08/2012   nl esophagus, duodenitis and erosive gastropathy, path - gastropathy no Hpylori, no rpt rec  . LUMBAR EPIDURAL INJECTION  03/2017   L5/S1 ([place])  . PERIPHERAL VASCULAR CATHETERIZATION N/A 02/02/2016   Procedure: Renal Angiography;  Surgeon: [name], MD;  Location: [place] CV LAB;  Service: Cardiovascular;  Laterality: N/A;  . PERIPHERAL VASCULAR CATHETERIZATION N/A 02/02/2016   Procedure: Abdominal Aortogram;  Surgeon: [name], MD;  Location: [place] [facility] LAB;  Service: Cardiovascular;  Laterality: N/A;  . RENAL DOPPLER  11/29/2011   Celiac&SMA-demonstrated vessel narrowing suggestive of a greater than 50% diameter reduction. Bilateral renal arteries-demonstrated vessel narrowing of 60-99% diameter reduction. Rt Kidney-mid pole lateral simple cyst noted measuring 1.29x0.76x1.11cm and exophytic cyst outside lower pole measuring 1.23x0.96x1.31. Lft Kidney-lateral mid to lower pole simple cyst measuering-1.24x9.83x1.24  . RIGHT/LEFT HEART CATH AND CORONARY/GRAFT ANGIOGRAPHY N/A 10/20/2017   Procedure: RIGHT/LEFT HEART CATH AND CORONARY/GRAFT ANGIOGRAPHY;  Surgeon: Burnell Blanks, MD;  Location: [place] [facility];   Service: Cardiovascular;  Laterality: N/A;  . TEE WITHOUT CARDIOVERSION N/A 06/25/2015   Procedure: TRANSESOPHAGEAL ECHOCARDIOGRAM (TEE);  Surgeon: Pixie Casino, MD;  Location: [facility] ENDOSCOPY;  Service: Cardiovascular;  Laterality: N/A;  . TEE WITHOUT CARDIOVERSION N/A 11/02/2016   Procedure: TRANSESOPHAGEAL ECHOCARDIOGRAM (TEE);  Surgeon: [name], MD;  Location: [facility] ENDOSCOPY;  Service: Cardiovascular;  Laterality: N/A;  . TONSILLECTOMY    . UPPER GASTROINTESTINAL ENDOSCOPY  2003   reflux esophagitis, erosive gastropathy, duodenal ulcer    Current Medications: Outpatient Medications Prior to Visit  Medication Sig Dispense Refill  . ACCU-CHEK AVIVA PLUS test strip CHECK BLOOD SUGAR THREE TIMES A DAY AND AS DIRECTED. 100 each 6  . acetaminophen (TYLENOL) 500 MG tablet Take 1 tablet (500 mg total) by mouth 3 (three) times daily. (Patient taking differently: Take 500 mg by mouth daily. )    . atorvastatin (LIPITOR) 20 MG tablet TAKE 1 TABLET DAILY. 90 tablet 0  . BAYER MICROLET LANCETS lancets Ck blood sugar three times daily and as  directed. Dx 250.00. Insulin dependent 100 each 11  . ciprofloxacin (CIPRO) 250 MG tablet Take 1 tablet (250 mg total) by mouth 2 (two) times daily. 84 tablet 0  . citalopram (CELEXA) 20 MG tablet Take 1 tablet (20 mg total) by mouth daily. 90 tablet 0  . Cyanocobalamin (B-12) 500 MCG TABS Take 500 mcg by mouth daily.     [name] ELIQUIS 5 MG TABS tablet Take 1 tablet (5 mg total) by mouth 2 (two) times daily. 60 tablet 6  . finasteride (PROSCAR) 5 MG tablet Take 1 tablet (5 mg total) by mouth daily. 30 tablet 3  . fluticasone (FLONASE) 50 MCG/ACT nasal spray Place 2 sprays into both nostrils daily.     . Insulin Syringe-Needle U-100 (B-D INS SYR ULTRAFINE 1CC/30G) 30G X 1/2" 1 ML MISC 1 Syringe by Does not apply route as directed. 100 each 3  . Insulin Syringe-Needle U-100 (B-D INS SYRINGE 0.5CC/30GX1/2") 30G X 1/2" 0.5 ML MISC Use as directed to inject insulin.  Dx: E11.65 100 each 3  . LANTUS 100 UNIT/ML injection Inject 0.35 mLs (35 Units total) into the skin at bedtime. 10 mL 3  . lisinopril (PRINIVIL,ZESTRIL) 40 MG tablet Take 1 tablet (40 mg total) by mouth daily. 90 tablet 2  . metFORMIN (GLUCOPHAGE) 1000 MG tablet Take 1 tablet (1,000 mg total) by mouth 2 (two) times daily with a meal. 180 tablet 1  . pantoprazole (PROTONIX) 20 MG tablet Take 1 tablet (20 mg total) by mouth daily. 30 tablet 6  . potassium chloride SA (K-DUR,KLOR-CON) 20 MEQ tablet Take 1 tablet (20 mEq total) by mouth daily. 30 tablet 0  . saw palmetto 160 MG capsule Take 320 mg by mouth daily.     . tamsulosin (FLOMAX) 0.4 MG CAPS capsule Take 1 capsule (0.4 mg total) by mouth daily after supper. 30 capsule 0  . torsemide (DEMADEX) 20 MG tablet Take 2 tablets (40 mg total) by mouth 2 (two) times daily. (Patient taking differently: Take 20 mg by mouth 2 (two) times daily. ) 130 tablet 3  . traZODone (DESYREL) 50 MG tablet Take 2 tablets (100 mg total) by mouth at bedtime. 180 tablet 0  . carvedilol (COREG) 3.125 MG tablet Take 1 tablet (3.125 mg total) by mouth 2 (two) times daily. 180 tablet 3   No facility-administered medications prior to visit.      Allergies:   Gabapentin; Metoprolol; Spironolactone; Amlodipine; Other; Rosiglitazone maleate; and Tricor [fenofibrate]   Social History   Socioeconomic History  . Marital status: Widowed    Spouse name: Not on file  . Number of children: Not on file  . Years of education: Not on file  . Highest education level: Not on file  Occupational History  . Not on file  Social Needs  . Financial resource strain: Not on file  . Food insecurity:    Worry: Not on file    Inability: Not on file  . Transportation needs:    Medical: Not on file    Non-medical: Not on file  Tobacco Use  . Smoking status: Former Smoker    Packs/day: 0.50    Years: 36.00    Pack years: 18.00    Types: Cigarettes    Last attempt to quit:  07/23/2013    Years since quitting: 4.6  . Smokeless tobacco: Never Used  Substance and Sexual Activity  . Alcohol use: No  . Drug use: No  . Sexual activity: Not Currently  Lifestyle  .  Physical activity:    Days per week: Not on file    Minutes per session: Not on file  . Stress: Not on file  Relationships  . Social connections:    Talks on phone: Not on file    Gets together: Not on file    Attends religious service: Not on file    Active member of club or organization: Not on file    Attends meetings of clubs or organizations: Not on file    Relationship status: Not on file  Other Topics Concern  . Not on file  Social History Narrative   Caffeine: 4 cups coffee/day, some tea and soda   Lives with wife   Occupation: Retired   Activity: no regular exercise   Diet: good water, fruits/vegetables daily     Family History:  The patient's family history includes Cancer in his brother, brother, maternal grandmother, and mother; Diabetes in his sister; Heart attack in his maternal grandfather, paternal grandfather, and paternal grandmother; Heart disease in his father and sister; Hypertension in his father and sister; Lung disease in his brother; Stroke in his sister.   ROS:   Please see the history of present illness.    ROS All other systems reviewed and are negative.   PHYSICAL EXAM:   VS:  BP 122/82 (BP Location: Left Arm)   Pulse 83   Ht 6\' 1"  (1.854 m)   Wt 206 lb (93.4 kg)   BMI 27.18 kg/m    GEN: Well nourished, well developed, in no acute distress  HEENT: normal  Neck: no JVD, carotid bruits, or masses Cardiac: RRR; no rubs, or gallops,no edema  +3/6 systolic murmur near RUSB Respiratory:  clear to auscultation bilaterally, normal work of breathing GI: soft, nontender, nondistended, + BS MS: no deformity or atrophy  Skin: warm and dry, no rash Neuro:  Alert and Oriented x 3, Strength and sensation are intact Psych: euthymic mood, full affect  Wt Readings  from Last 3 Encounters:  03/08/18 206 lb (93.4 kg)  02/15/18 208 lb 4 oz (94.5 kg)  01/31/18 204 lb (92.5 kg)      Studies/Labs Reviewed:   EKG:  EKG is ordered today.  The ekg ordered today demonstrates normal sinus rhythm, mild T wave inversions in lateral leads consistent with heart strain.  Recent Labs: 09/06/2017: B Natriuretic Peptide 587.9; TSH 1.832 09/07/2017: ALT 16 10/14/2017: Hemoglobin 13.5; Platelets 189 03/08/2018: BUN 36; Creatinine, Ser 1.41; NT-Pro BNP 843; Potassium 5.5; Sodium 142   Lipid Panel    Component Value Date/Time   CHOL 131 06/08/2016 1423   TRIG 95.0 06/08/2016 1423   TRIG 133 09/10/2011   HDL 39.50 06/08/2016 1423   CHOLHDL 3 06/08/2016 1423   VLDL 19.0 06/08/2016 1423   LDLCALC 73 06/08/2016 1423   LDLDIRECT 77 09/10/2011    Additional studies/ records that were reviewed today include:   Cath 10/20/2017 Conclusion     Ost RCA to Prox RCA lesion is 100% stenosed.  SVG graft was visualized by angiography and is normal in caliber.  The graft exhibits minimal luminal irregularities.  SVG graft was visualized by angiography and is normal in caliber.  The graft exhibits no disease.  SVG graft was visualized by angiography and is normal in caliber.  The graft exhibits no disease.  LIMA graft was visualized by non-selective angiography and is normal in caliber.  The graft exhibits no disease.  Ost Cx to Prox Cx lesion is 99% stenosed.  Ost 1st  Mrg lesion is 99% stenosed.  Ost 2nd Mrg lesion is 100% stenosed.  Ost LAD to Prox LAD lesion is 99% stenosed.  Ost 1st Diag lesion is 50% stenosed.  Hemodynamic findings consistent with mild pulmonary hypertension.   1. Severe triple vessel CAD with 4/4 patent bypass grafts 2. Severe stenosis proximal LAD. The mid and distal LAD filled from the patent LIMA graft.  3 Severe stenosis proximal Circumflex involving the intermediate branch and the first OM. The vein graft to the intermediate  branch is patent. The vein graft to the first OM branch is patent.  4. Chronic occlusion of the proximal RCa. The vein graft to the distal RCA is patent.  5. Aortic valve stenosis (mean gradient 21.5 mmHg, peak to peak gradient 23 mmHg, AVA 1.12 cm2).   Recommendations: I will review his data with Dr. Burt Knack and the TAVR team. Will proceed with planning for TAVR.        ASSESSMENT:    1. Coronary artery disease involving coronary bypass graft of native heart without angina pectoris   2. Atrial flutter, unspecified type ([place])   3. Nonrheumatic aortic valve stenosis   4. Carotid artery disease, unspecified laterality, unspecified type ([place])   5. Essential hypertension   6. Hyperlipidemia, unspecified hyperlipidemia type   7. Controlled type 2 diabetes mellitus without complication, without long-term current use of insulin ([place])   8. Renal artery stenosis (H[facility])      PLAN:  In order of problems listed above:  1. CAD s/p CABG: Recently had an episode of chest pain a week ago, this only lasted about 7 minutes before resolving.  Since then, he has not had any further chest discomfort.  I have reviewed the previous cath report in January 2019 with Dr. [name], I would not recommend any further work-up.  The bypass graft anatomy is quite stable there was no obvious lesion that could have caused his recent symptom.  2. Atrial flutter: On Eliquis 5 mg twice daily.  No obvious recurrence  3. Aortic valve stenosis: Although previous echocardiogram showed moderate to severe lesion, as part of TAVR work-up, he underwent left and right heart cath in January 2019, this actually demonstrated moderate aortic stenosis.  4. Carotid artery disease: Due for repeat Doppler.  Last Doppler in 2017  5. Hypertension: Blood pressure well controlled  6. Hyperlipidemia: on Lipitor 20 mg daily.  Last lipid panel was well controlled in September 2017, due for repeat lab work.  7. DM II: Managed by primary care  provider  8. Renal artery stenosis: Due for repeat Doppler.  Recommend order both carotid Doppler and renal artery Doppler on the follow-up.    Medication Adjustments/Labs and Tests Ordered: Current medicines are reviewed at length with the patient today.  Concerns regarding medicines are outlined above.  Medication changes, Labs and Tests ordered today are listed in the Patient Instructions below. Patient Instructions  Medication Instructions:  START Nitrostat take as needed for emergency chest pain  Labwork: Your physician recommends that you return for lab work in: TODAY-BMET, BNP  Testing/Procedures: None   Follow-Up: Your physician recommends that you schedule a follow-up appointment in: 4 months with Dr [name]  Any Other Special Instructions Will Be Listed Below (If Applicable).  If you need a refill on your cardiac medications before your next appointment, please call your pharmacy.     Hilbert Corrigan, [place]  03/10/2018 8:01 AM    [place],  [place]  [address] Phone: [phone]; Fax: [phone]

## 2018-03-08 NOTE — Patient Instructions (Addendum)
Medication Instructions:  START Nitrostat take as needed for emergency chest pain  Labwork: Your physician recommends that you return for lab work in: TODAY-BMET, BNP  Testing/Procedures: None   Follow-Up: Your physician recommends that you schedule a follow-up appointment in: 4 months with Dr [name]  Any Other Special Instructions Will Be Listed Below (If Applicable).  If you need a refill on your cardiac medications before your next appointment, please call your pharmacy.

## 2018-03-09 LAB — BASIC METABOLIC PANEL
BUN/Creatinine Ratio: 26 — ABNORMAL HIGH (ref 10–24)
BUN: 36 mg/dL — AB (ref 8–27)
CALCIUM: 10.1 mg/dL (ref 8.6–10.2)
CO2: 28 mmol/L (ref 20–29)
Chloride: 100 mmol/L (ref 96–106)
Creatinine, Ser: 1.41 mg/dL — ABNORMAL HIGH (ref 0.76–1.27)
GFR, EST AFRICAN AMERICAN: 53 mL/min/{1.73_m2} — AB (ref >59)
GFR, EST NON AFRICAN AMERICAN: 46 mL/min/{1.73_m2} — AB (ref >59)
Glucose: 172 mg/dL — ABNORMAL HIGH (ref 65–99)
Potassium: 5.5 mmol/L — ABNORMAL HIGH (ref 3.5–5.2)
Sodium: 142 mmol/L (ref 134–144)

## 2018-03-09 LAB — PRO B NATRIURETIC PEPTIDE: NT-Pro BNP: 843 pg/mL — ABNORMAL HIGH (ref 0–486)

## 2018-03-10 ENCOUNTER — Telehealth: Payer: Self-pay

## 2018-03-10 ENCOUNTER — Encounter: Payer: Self-pay | Admitting: Physician Assistant

## 2018-03-10 DIAGNOSIS — I2510 Atherosclerotic heart disease of native coronary artery without angina pectoris: Secondary | ICD-10-CM

## 2018-03-10 DIAGNOSIS — I5022 Chronic systolic (congestive) heart failure: Secondary | ICD-10-CM

## 2018-03-10 NOTE — Progress Notes (Signed)
Kidney function slightly worse and potassium is also high. Recommend stop potassium supplement and cut the current dose of torsemide by half. (please confirm the dosage patient is taking at home as current torsemide shows 2 different dosage). Repeat BMET in 1 week

## 2018-03-10 NOTE — Telephone Encounter (Signed)
Spoke with patients daughter in [place], per DPR,  She stated patient is taking 20mg  bid and that they will be out of town next week and will complete labs the following week.   Waiting for Isaac Laud to let me know the correct dosage of Torsemide patient needs to take.

## 2018-03-10 NOTE — Telephone Encounter (Signed)
Left for son to contact office to clarify the correct dosage on Torsemide and inform him that patient is to stop Potassium.  Patient will need labs one day next week to recheck kidneys.

## 2018-03-13 ENCOUNTER — Other Ambulatory Visit: Payer: Self-pay | Admitting: Family Medicine

## 2018-03-17 NOTE — Telephone Encounter (Signed)
Half of the previous dose, if he was taking it twice a day, just change to once a day with instruction of a PM dose on an as needed basis. (also for some reason, our record indicate he takes 2 of the 20mg  torsemide in the morning and another 2 tablets in the afternoon, please correct this if he is only taking 20mg  twice a day)

## 2018-03-22 NOTE — Telephone Encounter (Signed)
I have discussed with the daughter, will decrease lasix to 20mg  daily, he may take additional 20mg  in the afternoon if weight increase by more than 3 lbs overnight or 5 lbs in a single week. Pending 1 week BMET

## 2018-03-22 NOTE — Telephone Encounter (Signed)
Left message for patients daughter to contact the office to discuss meds.

## 2018-03-22 NOTE — Telephone Encounter (Signed)
Spoke with daughter, gave her updated torsemide instructions; she stated patient fell in the last week and and fell and his weight is up 2lbs. Daughter seeking advice as to what to do.

## 2018-03-25 ENCOUNTER — Other Ambulatory Visit: Payer: Self-pay

## 2018-03-25 ENCOUNTER — Emergency Department (HOSPITAL_COMMUNITY): Payer: Medicare Other

## 2018-03-25 ENCOUNTER — Encounter (HOSPITAL_COMMUNITY): Payer: Self-pay

## 2018-03-25 ENCOUNTER — Inpatient Hospital Stay (HOSPITAL_COMMUNITY)
Admission: EM | Admit: 2018-03-25 | Discharge: 2018-03-28 | DRG: 292 | Disposition: A | Discharge status: Home Health | Payer: Medicare Other | Attending: Family Medicine | Admitting: Family Medicine

## 2018-03-25 DIAGNOSIS — Z7901 Long term (current) use of anticoagulants: Secondary | ICD-10-CM | POA: Diagnosis not present

## 2018-03-25 DIAGNOSIS — I5032 Chronic diastolic (congestive) heart failure: Secondary | ICD-10-CM | POA: Diagnosis present

## 2018-03-25 DIAGNOSIS — E876 Hypokalemia: Secondary | ICD-10-CM | POA: Diagnosis not present

## 2018-03-25 DIAGNOSIS — E118 Type 2 diabetes mellitus with unspecified complications: Secondary | ICD-10-CM | POA: Diagnosis not present

## 2018-03-25 DIAGNOSIS — I5033 Acute on chronic diastolic (congestive) heart failure: Secondary | ICD-10-CM | POA: Diagnosis not present

## 2018-03-25 DIAGNOSIS — R7989 Other specified abnormal findings of blood chemistry: Secondary | ICD-10-CM

## 2018-03-25 DIAGNOSIS — Z823 Family history of stroke: Secondary | ICD-10-CM

## 2018-03-25 DIAGNOSIS — Z836 Family history of other diseases of the respiratory system: Secondary | ICD-10-CM

## 2018-03-25 DIAGNOSIS — I459 Conduction disorder, unspecified: Secondary | ICD-10-CM | POA: Diagnosis not present

## 2018-03-25 DIAGNOSIS — R0602 Shortness of breath: Secondary | ICD-10-CM | POA: Diagnosis not present

## 2018-03-25 DIAGNOSIS — I4892 Unspecified atrial flutter: Secondary | ICD-10-CM | POA: Diagnosis present

## 2018-03-25 DIAGNOSIS — R748 Abnormal levels of other serum enzymes: Secondary | ICD-10-CM

## 2018-03-25 DIAGNOSIS — Z961 Presence of intraocular lens: Secondary | ICD-10-CM | POA: Diagnosis present

## 2018-03-25 DIAGNOSIS — Z86010 Personal history of colonic polyps: Secondary | ICD-10-CM | POA: Diagnosis not present

## 2018-03-25 DIAGNOSIS — N3941 Urge incontinence: Secondary | ICD-10-CM | POA: Diagnosis present

## 2018-03-25 DIAGNOSIS — IMO0002 Reserved for concepts with insufficient information to code with codable children: Secondary | ICD-10-CM | POA: Diagnosis present

## 2018-03-25 DIAGNOSIS — Z951 Presence of aortocoronary bypass graft: Secondary | ICD-10-CM

## 2018-03-25 DIAGNOSIS — Z9842 Cataract extraction status, left eye: Secondary | ICD-10-CM

## 2018-03-25 DIAGNOSIS — Z9841 Cataract extraction status, right eye: Secondary | ICD-10-CM

## 2018-03-25 DIAGNOSIS — I700 Atherosclerosis of aorta: Secondary | ICD-10-CM | POA: Diagnosis present

## 2018-03-25 DIAGNOSIS — I2510 Atherosclerotic heart disease of native coronary artery without angina pectoris: Secondary | ICD-10-CM | POA: Diagnosis not present

## 2018-03-25 DIAGNOSIS — R001 Bradycardia, unspecified: Secondary | ICD-10-CM | POA: Diagnosis not present

## 2018-03-25 DIAGNOSIS — Z794 Long term (current) use of insulin: Secondary | ICD-10-CM

## 2018-03-25 DIAGNOSIS — I509 Heart failure, unspecified: Secondary | ICD-10-CM

## 2018-03-25 DIAGNOSIS — E1165 Type 2 diabetes mellitus with hyperglycemia: Secondary | ICD-10-CM | POA: Diagnosis not present

## 2018-03-25 DIAGNOSIS — Z66 Do not resuscitate: Secondary | ICD-10-CM | POA: Diagnosis not present

## 2018-03-25 DIAGNOSIS — I350 Nonrheumatic aortic (valve) stenosis: Secondary | ICD-10-CM | POA: Diagnosis present

## 2018-03-25 DIAGNOSIS — R778 Other specified abnormalities of plasma proteins: Secondary | ICD-10-CM | POA: Diagnosis present

## 2018-03-25 DIAGNOSIS — N401 Enlarged prostate with lower urinary tract symptoms: Secondary | ICD-10-CM | POA: Diagnosis present

## 2018-03-25 DIAGNOSIS — I110 Hypertensive heart disease with heart failure: Secondary | ICD-10-CM | POA: Diagnosis not present

## 2018-03-25 DIAGNOSIS — Z8249 Family history of ischemic heart disease and other diseases of the circulatory system: Secondary | ICD-10-CM

## 2018-03-25 DIAGNOSIS — I10 Essential (primary) hypertension: Secondary | ICD-10-CM | POA: Diagnosis not present

## 2018-03-25 DIAGNOSIS — R002 Palpitations: Secondary | ICD-10-CM | POA: Diagnosis not present

## 2018-03-25 DIAGNOSIS — Z79899 Other long term (current) drug therapy: Secondary | ICD-10-CM

## 2018-03-25 DIAGNOSIS — I701 Atherosclerosis of renal artery: Secondary | ICD-10-CM | POA: Diagnosis present

## 2018-03-25 DIAGNOSIS — J449 Chronic obstructive pulmonary disease, unspecified: Secondary | ICD-10-CM | POA: Diagnosis not present

## 2018-03-25 DIAGNOSIS — Z9089 Acquired absence of other organs: Secondary | ICD-10-CM

## 2018-03-25 DIAGNOSIS — Z95828 Presence of other vascular implants and grafts: Secondary | ICD-10-CM

## 2018-03-25 DIAGNOSIS — I483 Typical atrial flutter: Secondary | ICD-10-CM | POA: Diagnosis not present

## 2018-03-25 DIAGNOSIS — Z87891 Personal history of nicotine dependence: Secondary | ICD-10-CM | POA: Diagnosis not present

## 2018-03-25 DIAGNOSIS — E785 Hyperlipidemia, unspecified: Secondary | ICD-10-CM | POA: Diagnosis present

## 2018-03-25 DIAGNOSIS — Z833 Family history of diabetes mellitus: Secondary | ICD-10-CM

## 2018-03-25 DIAGNOSIS — I340 Nonrheumatic mitral (valve) insufficiency: Secondary | ICD-10-CM | POA: Diagnosis not present

## 2018-03-25 DIAGNOSIS — Z800 Family history of malignant neoplasm of digestive organs: Secondary | ICD-10-CM

## 2018-03-25 DIAGNOSIS — Z801 Family history of malignant neoplasm of trachea, bronchus and lung: Secondary | ICD-10-CM

## 2018-03-25 DIAGNOSIS — K210 Gastro-esophageal reflux disease with esophagitis: Secondary | ICD-10-CM | POA: Diagnosis not present

## 2018-03-25 DIAGNOSIS — I441 Atrioventricular block, second degree: Secondary | ICD-10-CM | POA: Diagnosis not present

## 2018-03-25 DIAGNOSIS — Z888 Allergy status to other drugs, medicaments and biological substances status: Secondary | ICD-10-CM

## 2018-03-25 LAB — CBC
HCT: 39.3 % (ref 39.0–52.0)
HEMOGLOBIN: 12.4 g/dL — AB (ref 13.0–17.0)
MCH: 30.1 pg (ref 26.0–34.0)
MCHC: 31.6 g/dL (ref 30.0–36.0)
MCV: 95.4 fL (ref 78.0–100.0)
PLATELETS: 158 10*3/uL (ref 150–400)
RBC: 4.12 MIL/uL — AB (ref 4.22–5.81)
RDW: 12.9 % (ref 11.5–15.5)
WBC: 6.8 10*3/uL (ref 4.0–10.5)

## 2018-03-25 LAB — BASIC METABOLIC PANEL
ANION GAP: 9 (ref 5–15)
BUN: 20 mg/dL (ref 6–20)
CALCIUM: 9.3 mg/dL (ref 8.9–10.3)
CO2: 33 mmol/L — ABNORMAL HIGH (ref 22–32)
CREATININE: 1.09 mg/dL (ref 0.61–1.24)
Chloride: 101 mmol/L (ref 101–111)
GFR calc non Af Amer: >60 mL/min (ref >60)
Glucose, Bld: 208 mg/dL — ABNORMAL HIGH (ref 65–99)
Potassium: 4.1 mmol/L (ref 3.5–5.1)
SODIUM: 143 mmol/L (ref 135–145)

## 2018-03-25 LAB — I-STAT TROPONIN, ED: TROPONIN I, POC: 0.23 ng/mL — AB (ref 0.00–0.08)

## 2018-03-25 LAB — TROPONIN I
Troponin I: 0.06 ng/mL (ref <0.03)
Troponin I: 0.05 ng/mL (ref <0.03)

## 2018-03-25 LAB — HEMOGLOBIN A1C
Hgb A1c MFr Bld: 8.7 % — ABNORMAL HIGH (ref 4.8–5.6)
Mean Plasma Glucose: 202.99 mg/dL

## 2018-03-25 LAB — BRAIN NATRIURETIC PEPTIDE: B NATRIURETIC PEPTIDE 5: 595.5 pg/mL — AB (ref 0.0–100.0)

## 2018-03-25 LAB — GLUCOSE, CAPILLARY
GLUCOSE-CAPILLARY: 183 mg/dL — AB (ref 65–99)
Glucose-Capillary: 149 mg/dL — ABNORMAL HIGH (ref 65–99)

## 2018-03-25 LAB — TSH: TSH: 1.604 u[IU]/mL (ref 0.350–4.500)

## 2018-03-25 MED ORDER — FINASTERIDE 5 MG PO TABS
5.0000 mg | ORAL_TABLET | Freq: Every day | ORAL | Status: DC
  Administered 2018-03-26 – 2018-03-28 (×3): 5 mg via ORAL
  Filled 2018-03-25 (×3): qty 1

## 2018-03-25 MED ORDER — INSULIN ASPART 100 UNIT/ML ~~LOC~~ SOLN
0.0000 [IU] | Freq: Three times a day (TID) | SUBCUTANEOUS | Status: DC
Start: 2018-03-25 — End: 2018-03-28
  Administered 2018-03-25: 1 [IU] via SUBCUTANEOUS
  Administered 2018-03-26 (×2): 2 [IU] via SUBCUTANEOUS
  Administered 2018-03-27 (×3): 1 [IU] via SUBCUTANEOUS
  Administered 2018-03-28: 3 [IU] via SUBCUTANEOUS

## 2018-03-25 MED ORDER — ACETAMINOPHEN 325 MG PO TABS
650.0000 mg | ORAL_TABLET | ORAL | Status: DC | PRN
  Administered 2018-03-27: 650 mg via ORAL
  Filled 2018-03-25: qty 2

## 2018-03-25 MED ORDER — ONDANSETRON HCL 4 MG/2ML IJ SOLN: 4.0000 mg | Freq: Four times a day (QID) | INTRAMUSCULAR | Status: DC | PRN

## 2018-03-25 MED ORDER — ACETAMINOPHEN 500 MG PO TABS
500.0000 mg | ORAL_TABLET | Freq: Every day | ORAL | Status: DC
  Administered 2018-03-26 – 2018-03-28 (×3): 500 mg via ORAL
  Filled 2018-03-25 (×3): qty 1

## 2018-03-25 MED ORDER — INSULIN ASPART 100 UNIT/ML ~~LOC~~ SOLN
0.0000 [IU] | Freq: Every day | SUBCUTANEOUS | Status: DC
Start: 2018-03-25 — End: 2018-03-28

## 2018-03-25 MED ORDER — TAMSULOSIN HCL 0.4 MG PO CAPS
0.4000 mg | ORAL_CAPSULE | Freq: Every day | ORAL | Status: DC
  Administered 2018-03-25 – 2018-03-27 (×3): 0.4 mg via ORAL
  Filled 2018-03-25 (×3): qty 1

## 2018-03-25 MED ORDER — INSULIN GLARGINE 100 UNIT/ML ~~LOC~~ SOLN
35.0000 [IU] | Freq: Every day | SUBCUTANEOUS | Status: DC
  Administered 2018-03-25 – 2018-03-27 (×3): 35 [IU] via SUBCUTANEOUS
  Filled 2018-03-25 (×5): qty 0.35

## 2018-03-25 MED ORDER — APIXABAN 5 MG PO TABS
5.0000 mg | ORAL_TABLET | Freq: Two times a day (BID) | ORAL | Status: DC
  Administered 2018-03-25 – 2018-03-28 (×6): 5 mg via ORAL
  Filled 2018-03-25 (×6): qty 1

## 2018-03-25 MED ORDER — TRAZODONE HCL 50 MG PO TABS
100.0000 mg | ORAL_TABLET | Freq: Every day | ORAL | Status: DC
  Administered 2018-03-25 – 2018-03-27 (×3): 100 mg via ORAL
  Filled 2018-03-25 (×4): qty 2

## 2018-03-25 MED ORDER — SODIUM CHLORIDE 0.9% FLUSH
3.0000 mL | INTRAVENOUS | Status: DC | PRN
Start: 2018-03-25 — End: 2018-03-28

## 2018-03-25 MED ORDER — SODIUM CHLORIDE 0.9% FLUSH
3.0000 mL | Freq: Two times a day (BID) | INTRAVENOUS | Status: DC
  Administered 2018-03-25 – 2018-03-28 (×6): 3 mL via INTRAVENOUS

## 2018-03-25 MED ORDER — ATORVASTATIN CALCIUM 20 MG PO TABS
20.0000 mg | ORAL_TABLET | Freq: Every day | ORAL | Status: DC
  Administered 2018-03-26 – 2018-03-28 (×3): 20 mg via ORAL
  Filled 2018-03-25 (×3): qty 1

## 2018-03-25 MED ORDER — FUROSEMIDE 10 MG/ML IJ SOLN
80.0000 mg | Freq: Once | INTRAMUSCULAR | Status: AC
  Administered 2018-03-25: 80 mg via INTRAVENOUS
  Filled 2018-03-25: qty 8

## 2018-03-25 MED ORDER — CARVEDILOL 3.125 MG PO TABS: 3.1250 mg | ORAL_TABLET | Freq: Two times a day (BID) | ORAL | Status: DC

## 2018-03-25 MED ORDER — CITALOPRAM HYDROBROMIDE 10 MG PO TABS
20.0000 mg | ORAL_TABLET | Freq: Every day | ORAL | Status: DC
  Administered 2018-03-26 – 2018-03-28 (×3): 20 mg via ORAL
  Filled 2018-03-25 (×3): qty 2

## 2018-03-25 MED ORDER — LISINOPRIL 20 MG PO TABS
40.0000 mg | ORAL_TABLET | Freq: Every day | ORAL | Status: DC
  Administered 2018-03-26 – 2018-03-28 (×3): 40 mg via ORAL
  Filled 2018-03-25 (×3): qty 2

## 2018-03-25 MED ORDER — SODIUM CHLORIDE 0.9 % IV SOLN: 250.0000 mL | INTRAVENOUS | Status: DC | PRN

## 2018-03-25 NOTE — Progress Notes (Signed)
Critical Trop called 0.06 Cardiologist made aware.

## 2018-03-25 NOTE — Progress Notes (Signed)
Per CCMD patient with 21 beats of wide QRS's. Patient asymptomatic, asleep. Paged NP Blount X and notified.  Awaiting on new orders.  Jawann Urbani, RN

## 2018-03-25 NOTE — H&P (Addendum)
History and Physical    Charles [name] QMG:[id number] DOB: 06-27-1932 DOA: 03/25/2018  PCP: [name], MD Patient coming from: home  Chief Complaint: sob/orthopnea  HPI: Charles [name] is a very pleasant very HOH 84 y.o. male with medical history significant diabetes, flutter on Eliquis, diabetes, hypertension, aortic stenosis, Mobitz 1 AV block, CAD status post CABG 2004 GERD, hyperlipidemia since emergency Department chief complaint worsening shortness of breath orthopnea lower extremity edema and a 5 pound weight gain in one day. This or evaluation reveals BNP greater than 500, increased work of breathing, 3+ bilateral pitting edema up to abdomen. [facility] are asked to admit  Information is obtained from the patient and his son who is at the bedside. Recent reports intermittent shortness of breath with exertion over the last several days. Last night he experienced orthopnea had difficulty sleeping. He weighs every day to day he was 5 pounds heavier than he was yesterday. The history of CHF and A. Fib. Associated symptoms include palpitations intermittent nonproductive cough as well as lower extremity edema  And increased abdominal girth. He denies chest pain nausea vomiting diarrhea constipation melena bright red blood per rectum. He denies headache dizziness syncope or near-syncope. He denies dysuria hematuria frequency or urgency. He denies fever chills recent travel or sick contacts.  ED Course:  In the emergency department he's afebrile with a blood pressure high end of normal and bradycardic with heart rate between 49 and 52. Oxygen saturation level 100% on 2 L nasal cannula.  Review of Systems: As per HPI otherwise all other systems reviewed and are negative.   Ambulatory Status: uses a walker a fall 2 weeks ago mechanical minimal assist with ADLs  Past Medical History:  Diagnosis Date  . Abdominal aortic atherosclerosis ([place])    by xray  . Aortic stenosis 06/2014     moderate-severe  . Arthritis    in lower back (06/24/2015)  . Atrial flutter ([place])    notes 06/24/2015  . AV block, Mobitz 1 09/06/2017   Archie Endo 09/06/2017  . 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 CAD (coronary artery disease) 2004   a. s/p CABG in 2004 with LIMA-LAD, SVG-RCA, SVG-OM, and SVG-RI  . Carotid stenosis 1999   s/p L CEA  . CHF (congestive heart failure) ([place]) dx'd 06/2015   a. EF 55-60% by echo in 2016. b. 10/2016: EF reduced to 30-35% by TEE in the setting of atrial fibrillation with RVR  . Colon polyp 2005   (Dr. Tiffany Kocher)  . Compression fracture of L1 lumbar vertebra (HCC) remote  . COPD (chronic obstructive pulmonary disease) ([place]) 03/2011   by xray  . Depression    hx  . Ex-smoker   . GERD (gastroesophageal reflux disease) 2003   h/o duodenal ulcer per EGD as well as esophagitis  . History of colon polyps 2003, 2005   adenomatous Vira Agar)  . HLD (hyperlipidemia)   . HTN (hypertension)   . Psoriasis   . Renal artery stenosis ([place]) 2004   70% bilateral, followed by cards  . T2DM (type 2 diabetes mellitus) ([place]) 1995  . Urge incontinence of urine     Past Surgical History:  Procedure Laterality Date  . CARDIAC CATHETERIZATION  03/06/2003   No intervention - recommend CABG  . CARDIOVASCULAR STRESS TEST  11/2010   normal perfusion, no evidence of ischemia, EF 62% post exercise  . CARDIOVASCULAR STRESS TEST  11/28/2012   Mild diaphragmatic attenuation; cannot exclude  a focal region of nontransmural inferior scar  . CARDIOVERSION N/A 06/25/2015   Procedure: CARDIOVERSION;  Surgeon: Pixie Casino, MD;  Location: [facility] ENDOSCOPY;  Service: Cardiovascular;  Laterality: N/A;  . CARDIOVERSION N/A 11/02/2016   Procedure: CARDIOVERSION;  Surgeon: [name], MD;  Location: [place];  Service: Cardiovascular;  Laterality: N/A;  . CAROTID ENDARTERECTOMY Left 1999   ([place])  . CATARACT EXTRACTION W/ INTRAOCULAR LENS  IMPLANT, BILATERAL  Bilateral 01/2013   [place]  . COLONOSCOPY  2003   colon polyp x3 - adenomatous Tiffany Kocher)  . COLONOSCOPY  10/08/2012   2 TA, diverticulosis, int hem, no rpt rec Tiffany Kocher)  . CORONARY ANGIOPLASTY    . CORONARY ARTERY BYPASS GRAFT  03/07/2003   4v CABG ([place]) with LIMA to LAD, vein graft to RCA, 1st obtuse marginal, and ramus intermedius  . ESOPHAGOGASTRODUODENOSCOPY  10/08/2012   nl esophagus, duodenitis and erosive gastropathy, path - gastropathy no Hpylori, no rpt rec  . LUMBAR EPIDURAL INJECTION  03/2017   L5/S1 ([place])  . PERIPHERAL VASCULAR CATHETERIZATION N/A 02/02/2016   Procedure: Renal Angiography;  Surgeon: [name], MD;  Location: [place] [facility];  Service: Cardiovascular;  Laterality: N/A;  . PERIPHERAL VASCULAR CATHETERIZATION N/A 02/02/2016   Procedure: Abdominal Aortogram;  Surgeon: [name], MD;  Location: [place] [facility];  Service: Cardiovascular;  Laterality: N/A;  . RENAL DOPPLER  11/29/2011   Celiac&SMA-demonstrated vessel narrowing suggestive of a greater than 50% diameter reduction. Bilateral renal arteries-demonstrated vessel narrowing of 60-99% diameter reduction. Rt Kidney-mid pole lateral simple cyst noted measuring 1.29x0.76x1.11cm and exophytic cyst outside lower pole measuring 1.23x0.96x1.31. Lft Kidney-lateral mid to lower pole simple cyst measuering-1.24x9.83x1.24  . RIGHT/LEFT HEART CATH AND CORONARY/GRAFT ANGIOGRAPHY N/A 10/20/2017   Procedure: RIGHT/LEFT HEART CATH AND CORONARY/GRAFT ANGIOGRAPHY;  Surgeon: Burnell Blanks, MD;  Location: [place] [facility];  Service: Cardiovascular;  Laterality: N/A;  . TEE WITHOUT CARDIOVERSION N/A 06/25/2015   Procedure: TRANSESOPHAGEAL ECHOCARDIOGRAM (TEE);  Surgeon: Pixie Casino, MD;  Location: [facility] ENDOSCOPY;  Service: Cardiovascular;  Laterality: N/A;  . TEE WITHOUT CARDIOVERSION N/A 11/02/2016   Procedure: TRANSESOPHAGEAL ECHOCARDIOGRAM (TEE);  Surgeon: [name], MD;  Location[facility] ENDOSCOPY;   Service: Cardiovascular;  Laterality: N/A;  . TONSILLECTOMY    . UPPER GASTROINTESTINAL ENDOSCOPY  2003   reflux esophagitis, erosive gastropathy, duodenal ulcer    Social History   Socioeconomic History  . Marital status: Widowed    Spouse name: Not on file  . Number of children: Not on file  . Years of education: Not on file  . Highest education level: Not on file  Occupational History  . Not on file  Social Needs  . Financial resource strain: Not on file  . Food insecurity:    Worry: Not on file    Inability: Not on file  . Transportation needs:    Medical: Not on file    Non-medical: Not on file  Tobacco Use  . Smoking status: Former Smoker    Packs/day: 0.50    Years: 36.00    Pack years: 18.00    Types: Cigarettes    Last attempt to quit: 07/23/2013    Years since quitting: 4.6  . Smokeless tobacco: Never Used  Substance and Sexual Activity  . Alcohol use: No  . Drug use: No  . Sexual activity: Not Currently  Lifestyle  . Physical activity:    Days per week: Not on file    Minutes per session: Not on  file  . Stress: Not on file  Relationships  . Social connections:    Talks on phone: Not on file    Gets together: Not on file    Attends religious service: Not on file    Active member of club or organization: Not on file    Attends meetings of clubs or organizations: Not on file    Relationship status: Not on file  . Intimate partner violence:    Fear of current or ex partner: Not on file    Emotionally abused: Not on file    Physically abused: Not on file    Forced sexual activity: Not on file  Other Topics Concern  . Not on file  Social History Narrative   Caffeine: 4 cups coffee/day, some tea and soda   Lives with wife   Occupation: Retired   Activity: no regular exercise   Diet: good water, fruits/vegetables daily    Allergies  Allergen Reactions  . Gabapentin Other (See Comments)    Leg pain, weakness   . Metoprolol Swelling  .  Spironolactone Other (See Comments)    Painful gynecomastia  . Amlodipine Other (See Comments)    Edema  . Other Other (See Comments)    Horse serum - myalgias  . Rosiglitazone Maleate Other (See Comments)    REACTION: Did not help  . Tricor [Fenofibrate] Other (See Comments)    myalgias    Family History  Problem Relation Age of Onset  . Hypertension Father   . Heart disease Father   . Cancer Mother        GI cancer  . Diabetes Sister   . Heart disease Sister   . Stroke Sister   . Hypertension Sister   . Cancer Brother        Lung cancer - nonsmoker  . Cancer Maternal Grandmother   . Heart attack Maternal Grandfather   . Heart attack Paternal Grandmother   . Heart attack Paternal Grandfather   . Cancer Brother        Pancreatic cancer  . Lung disease Brother     Prior to Admission medications   Medication Sig Start Date End Date Taking? Authorizing Provider  acetaminophen (TYLENOL) 500 MG tablet Take 1 tablet (500 mg total) by mouth 3 (three) times daily. Patient taking differently: Take 500 mg by mouth daily.  02/17/16  Yes [name], MD  atorvastatin (LIPITOR) 20 MG tablet TAKE 1 TABLET DAILY. 01/12/18  Yes [name], MD  carvedilol (COREG) 3.125 MG tablet Take 1 tablet (3.125 mg total) by mouth 2 (two) times daily. 10/06/17 03/25/18 Yes [name], MD  citalopram (CELEXA) 20 MG tablet Take 1 tablet (20 mg total) by mouth daily. 11/08/17  Yes [name], MD  ELIQUIS 5 MG TABS tablet Take 1 tablet (5 mg total) by mouth 2 (two) times daily. 12/13/17  Yes [name], MD  finasteride (PROSCAR) 5 MG tablet Take 1 tablet (5 mg total) by mouth daily. 02/15/18  Yes [name], MD  LANTUS 100 UNIT/ML injection Inject 0.35 mLs (35 Units total) into the skin at bedtime. 02/24/18  Yes [name], MD  lisinopril (PRINIVIL,ZESTRIL) 40 MG tablet Take 1 tablet (40 mg total) by mouth daily. 01/02/18  Yes [name], MD  metFORMIN  (GLUCOPHAGE) 1000 MG tablet Take 1 tablet (1,000 mg total) by mouth 2 (two) times daily with a meal. 03/06/18  Yes [name], MD  saw palmetto 160 MG capsule Take 320 mg by mouth  daily.    Yes [provider]  tamsulosin (FLOMAX) 0.4 MG CAPS capsule Take 1 capsule (0.4 mg total) by mouth daily after supper. 03/13/18  Yes [name], MD  torsemide (DEMADEX) 20 MG tablet Take 2 tablets (40 mg total) by mouth 2 (two) times daily. Patient taking differently: Take 20 mg by mouth 2 (two) times daily.  09/22/17  Yes Barrett, Evelene Croon, PA-C  traZODone (DESYREL) 50 MG tablet Take 2 tablets (100 mg total) by mouth at bedtime. 01/23/18  Yes [name], MD  ciprofloxacin (CIPRO) 250 MG tablet Take 1 tablet (250 mg total) by mouth 2 (two) times daily. 01/31/18   [name], MD  nitroGLYCERIN (NITROSTAT) 0.4 MG SL tablet Place 1 tablet (0.4 mg total) under the tongue every 5 (five) minutes as needed for chest pain. 03/08/18 06/06/18  Almyra Deforest, PA    Physical Exam: Vitals:   03/25/18 1157 03/25/18 1233 03/25/18 1605  BP: (!) 166/95 (!) 184/91 (!) 164/68  Pulse: (!) 52 (!) 140 (!) 49  Resp: (!) 22 (!) 21 18  Temp: 97.7 F (36.5 C)  97.8 F (36.6 C)  TempSrc: Oral  Oral  SpO2: 97% 99% 100%  Weight:   95.2 kg (209 lb 12.8 oz)  Height:   6\' 1"  (1.854 m)     General:  Appears calm and comfortable sitting up in bed in no acute distress Eyes:  PERRL, EOMI, normal lids, iris ENT:  grossly normal hearing, lips & tongue, because membranes of his mouth are dry but pink Neck:  no LAD, masses or thyromegaly Cardiovascular:  Irregularly irregular, no m/r/g. 2+ LE edema bilaterally.  Respiratory:  Mild increased work of breathing with conversation. Abdomen:  ntnd, abdominal distention slightly firm +BS Skin:  no rash or induration seen on limited exam Musculoskeletal:  grossly normal tone BUE/BLE, good ROM, no bony abnormality Psychiatric:  grossly normal mood and affect, speech  fluent and appropriate, AOx3 speech clear facial symmetry Neurologic:  CN 2-12 grossly intact, moves all extremities in coordinated fashion, sensation intact  Labs on Admission: I have personally reviewed following labs and imaging studies  CBC: Recent Labs  Lab 03/25/18 1214  WBC 6.8  HGB 12.4*  HCT 39.3  MCV 95.4  PLT 852   Basic Metabolic Panel: Recent Labs  Lab 03/25/18 1214  NA 143  K 4.1  CL 101  CO2 33*  GLUCOSE 208*  BUN 20  CREATININE 1.09  CALCIUM 9.3   GFR: Estimated Creatinine Clearance: 58 mL/min (by C-G formula based on SCr of 1.09 mg/dL). Liver Function Tests: No results for input(s): AST, ALT, ALKPHOS, BILITOT, PROT, ALBUMIN in the last 168 hours. No results for input(s): LIPASE, AMYLASE in the last 168 hours. No results for input(s): AMMONIA in the last 168 hours. Coagulation Profile: No results for input(s): INR, PROTIME in the last 168 hours. Cardiac Enzymes: No results for input(s): CKTOTAL, CKMB, CKMBINDEX, TROPONINI in the last 168 hours. BNP (last 3 results) Recent Labs    03/08/18 1522  PROBNP 843*   HbA1C: No results for input(s): HGBA1C in the last 72 hours. CBG: No results for input(s): GLUCAP in the last 168 hours. Lipid Profile: No results for input(s): CHOL, HDL, LDLCALC, TRIG, CHOLHDL, LDLDIRECT in the last 72 hours. Thyroid Function Tests: No results for input(s): TSH, T4TOTAL, FREET4, T3FREE, THYROIDAB in the last 72 hours. Anemia Panel: No results for input(s): VITAMINB12, FOLATE, FERRITIN, TIBC, IRON, RETICCTPCT in the last 72 hours. Urine analysis:  Component Value Date/Time   BILIRUBINUR negative 02/15/2018 1601   PROTEINUR negative 02/15/2018 1601   UROBILINOGEN 0.2 02/15/2018 1601   NITRITE negative 02/15/2018 1601   LEUKOCYTESUR Large (3+) (A) 02/15/2018 1601    Creatinine Clearance: Estimated Creatinine Clearance: 58 mL/min (by C-G formula based on SCr of 1.09 mg/dL).  Sepsis  Labs: @LABRCNTIP (procalcitonin:4,lacticidven:4) )No results found for this or any previous visit (from the past 240 hour(s)).   Radiological Exams on Admission: Dg Chest 2 View  Result Date: 03/25/2018 CLINICAL DATA:  Pt presents for evaluation of sob, 5 lb weight gain overnight and palpitations. Hx of afib, on elliquis. Hx of CHF. EXAM: CHEST - 2 VIEW COMPARISON:  09/06/2017 FINDINGS: There stable changes from prior CABG surgery. The cardiac silhouette is normal in size. No mediastinal or hilar masses or evidence of adenopathy. Linear and reticular opacity is noted in the left upper lobe lingula consistent with scarring, stable. Lungs are otherwise clear. No pleural effusion or pneumothorax. There is a mild compression fracture the upper lumbar spine, stable. No acute skeletal abnormality. IMPRESSION: No acute cardiopulmonary disease. No evidence of congestive heart failure. Electronically Signed   By: Lajean Manes M.D.   On: 03/25/2018 12:47    EKG: Independently reviewed. Sinus rhythm Second deg AVB, Mobitz I (Wenckebach) Anterior infarct, old Repol abnrm suggests ischemia, lateral leads  Assessment/Plan Principal Problem:   Acute on chronic diastolic CHF (congestive heart failure) (HCC) Active Problems:   Bradycardia   Elevated troponin   Heart block   Diabetes mellitus type 2, uncontrolled, with complications (HCC)   Essential hypertension   Cardiovascular disease   Aortic stenosis, moderate   Coronary artery disease involving native coronary artery of native heart without angina pectoris   Atrial flutter ([place])   #1. Acute on chronic diastolic heart failure in setting of heart block.  Patient with lower extremity edema to the abdomen, orthopnea and 5lb weight gain in 24 hours.  Echo done November 2018 reveals EF of 55-60%, severe concentric hypertrophy grade 2 diastolic dysfunction moderate to severe aortic stenosis. EKG with SR, second degree AV block Home medications include Coreg,  lisinopril, Demadex -admit  -hold coreg as HR range 49-55 during exam -defer management to cardiology  #2. Heart block. Hx of Mobitz 1 AV block.  -holding coreg -defer to cardiology  #3. Elevated troponin. May be related to demand. Initial troponin 0.23. Patient denies chest pain. EKG as noted above. -Cycle troponin -Serial EKG -Lipid panel -Patient cardiology assistant  #4. Diabetes. Home medications include Lantus oral agents. Serum glucose 202 on admission. -Continue Lantus -Hold oral agents for now -Siding scale insulin for optimal control  #5. Hypertension.blood pressure high end of normal while in the emergency department home medications include lisinopril, Demadex -holding demadex for now -continue lisinopril -monitor  #6.hx aflutter. Hx cardioversion x2.  Home meds include eliquis -continue eliquis    DVT prophylaxis: eliquis  Code Status: dnr  Family Communication: son at bedside  Disposition Plan: home  Consults called: [facility] cardiolog  Admission status: inpatient    Radene Gunning MD [facility]  If 7PM-7AM, please contact night-coverage [email or web address] Password [facility]  03/25/2018, 4:50 PM

## 2018-03-25 NOTE — ED Notes (Signed)
Patient transported to X-ray 

## 2018-03-25 NOTE — Progress Notes (Signed)
PA at bedside.

## 2018-03-25 NOTE — ED Notes (Signed)
Attempted to call report to floor 

## 2018-03-25 NOTE — ED Provider Notes (Signed)
[facility] EMERGENCY DEPARTMENT Provider Note   CSN: [id number] Arrival date & time: 03/25/18  1152     History   Chief Complaint Chief Complaint  Patient presents with  . Shortness of Breath    HPI Charles [name] is a 84 y.o. male.  HPI Patient presents with shortness of breath.  Has had over the last few days.  Has had more shortness of breath with exertion over the last week.  States he cannot walk nearly as far.  Does get some chest tightness at times with it.  He has a history of CHF and is atrial fibrillation.  Also history of type II AV block.  No fevers.  No cough.  Increased swelling.  Reported weight is up 5 pounds over the last couple days.  Pain is dull.  Pain-free right now at rest.  States he is felt lightheaded at times also.  Patient has not been able to sleep flat.  States he had to sit more seating up in a recliner. Past Medical History:  Diagnosis Date  . Abdominal aortic atherosclerosis ([place])    by xray  . Aortic stenosis 06/2014   moderate-severe  . Arthritis    in lower back (06/24/2015)  . Atrial flutter ([place])    notes 06/24/2015  . AV block, Mobitz 1 09/06/2017   Archie Endo 09/06/2017  . 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 CAD (coronary artery disease) 2004   a. s/p CABG in 2004 with LIMA-LAD, SVG-RCA, SVG-OM, and SVG-RI  . Carotid stenosis 1999   s/p L CEA  . CHF (congestive heart failure) ([place]) dx'd 06/2015   a. EF 55-60% by echo in 2016. b. 10/2016: EF reduced to 30-35% by TEE in the setting of atrial fibrillation with RVR  . Colon polyp 2005   (Dr. Tiffany Kocher)  . Compression fracture of L1 lumbar vertebra (HCC) remote  . COPD (chronic obstructive pulmonary disease) ([place]) 03/2011   by xray  . Depression    hx  . Ex-smoker   . GERD (gastroesophageal reflux disease) 2003   h/o duodenal ulcer per EGD as well as esophagitis  . History of colon polyps 2003, 2005   adenomatous Vira Agar)  . HLD  (hyperlipidemia)   . HTN (hypertension)   . Psoriasis   . Renal artery stenosis ([place]) 2004   70% bilateral, followed by cards  . T2DM (type 2 diabetes mellitus) ([place]) 1995  . Urge incontinence of urine     Patient Active Problem List   Diagnosis Date Noted  . Scrotal swelling 02/16/2018  . Acute cystitis without hematuria 01/17/2018  . Coronary artery disease involving native coronary artery of native heart without angina pectoris   . Acute on chronic diastolic CHF (congestive heart failure) ([place]) 09/06/2017  . Acute bacterial sinusitis 07/14/2017  . Renal insufficiency 07/01/2017  . Urinary urgency 07/01/2017  . Prostatitis 01/28/2017  . Exertional dyspnea 01/28/2017  . Gross hematuria 11/25/2016  . Chronic systolic congestive heart failure ([place]) 11/16/2016  . Chronic anticoagulation 11/05/2016  . Bradycardia 10/28/2016  . Chronic radicular pain of lower back 02/17/2016  . Typical atrial flutter ([place]) 06/11/2015  . Advanced care planning/counseling discussion 10/08/2014  . Health maintenance examination 10/08/2014  . Aortic stenosis, moderate 06/04/2014  . DDD (degenerative disc disease), lumbar 10/02/2013  . Medicare annual wellness visit, subsequent 10/02/2013  . Ex-smoker   . Carotid arterial disease ([place]) 08/23/2013  . Renal artery stenosis ([place]) 08/23/2013  . MDD (  major depressive disorder), recurrent episode, moderate ([place]) 09/21/2007  . OSTEOARTHRITIS 09/21/2007  . COLONIC POLYPS 01/31/2007  . Diabetes mellitus type 2, uncontrolled, with complications ([place]) 36/64/4034  . HYPERCHOLESTEROLEMIA 01/31/2007  . ERECTILE DYSFUNCTION 01/31/2007  . Essential hypertension 01/31/2007  . Coronary atherosclerosis 01/31/2007  . Cardiovascular disease 01/31/2007  . ALLERGIC RHINITIS 01/31/2007  . GERD 01/31/2007  . PSORIASIS 01/31/2007    Past Surgical History:  Procedure Laterality Date  . CARDIAC CATHETERIZATION  03/06/2003   No intervention - recommend CABG  .  CARDIOVASCULAR STRESS TEST  11/2010   normal perfusion, no evidence of ischemia, EF 62% post exercise  . CARDIOVASCULAR STRESS TEST  11/28/2012   Mild diaphragmatic attenuation; cannot exclude a focal region of nontransmural inferior scar  . CARDIOVERSION N/A 06/25/2015   Procedure: CARDIOVERSION;  Surgeon: Pixie Casino, MD;  Location: [facility] ENDOSCOPY;  Service: Cardiovascular;  Laterality: N/A;  . CARDIOVERSION N/A 11/02/2016   Procedure: CARDIOVERSION;  Surgeon: [name], MD;  Location: [facility];  Service: Cardiovascular;  Laterality: N/A;  . CAROTID ENDARTERECTOMY Left 1999   ([place])  . CATARACT EXTRACTION W/ INTRAOCULAR LENS  IMPLANT, BILATERAL Bilateral 01/2013   [place]  . COLONOSCOPY  2003   colon polyp x3 - adenomatous Tiffany Kocher)  . COLONOSCOPY  10/08/2012   2 TA, diverticulosis, int hem, no rpt rec Tiffany Kocher)  . CORONARY ANGIOPLASTY    . CORONARY ARTERY BYPASS GRAFT  03/07/2003   4v CABG ([place]) with LIMA to LAD, vein graft to RCA, 1st obtuse marginal, and ramus intermedius  . ESOPHAGOGASTRODUODENOSCOPY  10/08/2012   nl esophagus, duodenitis and erosive gastropathy, path - gastropathy no Hpylori, no rpt rec  . LUMBAR EPIDURAL INJECTION  03/2017   L5/S1 ([place])  . PERIPHERAL VASCULAR CATHETERIZATION N/A 02/02/2016   Procedure: Renal Angiography;  Surgeon: [name], MD;  Location: [facility];  Service: Cardiovascular;  Laterality: N/A;  . PERIPHERAL VASCULAR CATHETERIZATION N/A 02/02/2016   Procedure: Abdominal Aortogram;  Surgeon: [name], MD;  Location: [facility];  Service: Cardiovascular;  Laterality: N/A;  . RENAL DOPPLER  11/29/2011   Celiac&SMA-demonstrated vessel narrowing suggestive of a greater than 50% diameter reduction. Bilateral renal arteries-demonstrated vessel narrowing of 60-99% diameter reduction. Rt Kidney-mid pole lateral simple cyst noted measuring 1.29x0.76x1.11cm and exophytic cyst outside lower pole measuring 1.23x0.96x1.31. Lft  Kidney-lateral mid to lower pole simple cyst measuering-1.24x9.83x1.24  . RIGHT/LEFT HEART CATH AND CORONARY/GRAFT ANGIOGRAPHY N/A 10/20/2017   Procedure: RIGHT/LEFT HEART CATH AND CORONARY/GRAFT ANGIOGRAPHY;  Surgeon: Burnell Blanks, MD;  Location: [facility];  Service: Cardiovascular;  Laterality: N/A;  . TEE WITHOUT CARDIOVERSION N/A 06/25/2015   Procedure: TRANSESOPHAGEAL ECHOCARDIOGRAM (TEE);  Surgeon: Pixie Casino, MD;  Location: [facility] ENDOSCOPY;  Service: Cardiovascular;  Laterality: N/A;  . TEE WITHOUT CARDIOVERSION N/A 11/02/2016   Procedure: TRANSESOPHAGEAL ECHOCARDIOGRAM (TEE);  Surgeon: [name], MD;  Location: [facility] ENDOSCOPY;  Service: Cardiovascular;  Laterality: N/A;  . TONSILLECTOMY    . UPPER GASTROINTESTINAL ENDOSCOPY  2003   reflux esophagitis, erosive gastropathy, duodenal ulcer        Home Medications    Prior to Admission medications   Medication Sig Start Date End Date Taking? Authorizing Provider  acetaminophen (TYLENOL) 500 MG tablet Take 1 tablet (500 mg total) by mouth 3 (three) times daily. Patient taking differently: Take 500 mg by mouth daily.  02/17/16  Yes [name], MD  atorvastatin (LIPITOR) 20 MG tablet TAKE 1 TABLET DAILY. 01/12/18  Yes [name],  Garlon Hatchet, MD  carvedilol (COREG) 3.125 MG tablet Take 1 tablet (3.125 mg total) by mouth 2 (two) times daily. 10/06/17 03/25/18 Yes [name], MD  citalopram (CELEXA) 20 MG tablet Take 1 tablet (20 mg total) by mouth daily. 11/08/17  Yes [name], MD  ELIQUIS 5 MG TABS tablet Take 1 tablet (5 mg total) by mouth 2 (two) times daily. 12/13/17  Yes [name], MD  finasteride (PROSCAR) 5 MG tablet Take 1 tablet (5 mg total) by mouth daily. 02/15/18  Yes [name], MD  LANTUS 100 UNIT/ML injection Inject 0.35 mLs (35 Units total) into the skin at bedtime. 02/24/18  Yes [name], MD  lisinopril (PRINIVIL,ZESTRIL) 40 MG tablet Take 1 tablet (40 mg total) by  mouth daily. 01/02/18  Yes [name], MD  metFORMIN (GLUCOPHAGE) 1000 MG tablet Take 1 tablet (1,000 mg total) by mouth 2 (two) times daily with a meal. 03/06/18  Yes [name], MD  saw palmetto 160 MG capsule Take 320 mg by mouth daily.    Yes [provider]  tamsulosin (FLOMAX) 0.4 MG CAPS capsule Take 1 capsule (0.4 mg total) by mouth daily after supper. 03/13/18  Yes [name], MD  torsemide (DEMADEX) 20 MG tablet Take 2 tablets (40 mg total) by mouth 2 (two) times daily. Patient taking differently: Take 20 mg by mouth 2 (two) times daily.  09/22/17  Yes Barrett, Evelene Croon, PA-C  traZODone (DESYREL) 50 MG tablet Take 2 tablets (100 mg total) by mouth at bedtime. 01/23/18  Yes [name], MD  ciprofloxacin (CIPRO) 250 MG tablet Take 1 tablet (250 mg total) by mouth 2 (two) times daily. 01/31/18   [name], MD  nitroGLYCERIN (NITROSTAT) 0.4 MG SL tablet Place 1 tablet (0.4 mg total) under the tongue every 5 (five) minutes as needed for chest pain. 03/08/18 06/06/18  Almyra Deforest, PA  pantoprazole (PROTONIX) 20 MG tablet Take 1 tablet (20 mg total) by mouth daily. Patient not taking: Reported on 03/25/2018 02/13/18   [name], MD  potassium chloride SA (K-DUR,KLOR-CON) 20 MEQ tablet Take 1 tablet (20 mEq total) by mouth daily. Patient not taking: Reported on 03/25/2018 10/12/17   Barrett, Evelene Croon, PA-C    Family History Family History  Problem Relation Age of Onset  . Hypertension Father   . Heart disease Father   . Cancer Mother        GI cancer  . Diabetes Sister   . Heart disease Sister   . Stroke Sister   . Hypertension Sister   . Cancer Brother        Lung cancer - nonsmoker  . Cancer Maternal Grandmother   . Heart attack Maternal Grandfather   . Heart attack Paternal Grandmother   . Heart attack Paternal Grandfather   . Cancer Brother        Pancreatic cancer  . Lung disease Brother     Social History Social History   Tobacco Use    . Smoking status: Former Smoker    Packs/day: 0.50    Years: 36.00    Pack years: 18.00    Types: Cigarettes    Last attempt to quit: 07/23/2013    Years since quitting: 4.6  . Smokeless tobacco: Never Used  Substance Use Topics  . Alcohol use: No  . Drug use: No     Allergies   Gabapentin; Metoprolol; Spironolactone; Amlodipine; Other; Rosiglitazone maleate; and Tricor [fenofibrate]   Review of Systems Review of Systems  Constitutional:  Negative for appetite change and fever.  HENT: Negative for congestion.   Respiratory: Positive for shortness of breath.   Cardiovascular: Positive for chest pain and leg swelling.  Gastrointestinal: Negative for abdominal pain.  Genitourinary: Negative for flank pain.  Neurological: Positive for light-headedness.  Hematological: Negative for adenopathy.  Psychiatric/Behavioral: Negative for confusion.     Physical Exam Updated Vital Signs BP (!) 184/91   Pulse (!) 140   Temp 97.7 F (36.5 C) (Oral)   Resp (!) 21   SpO2 99%   Physical Exam  Constitutional: He appears well-developed.  HENT:  Head: Normocephalic.  Neck: Neck supple. No JVD present.  Cardiovascular:  Irregular rhythm with murmur.  Pulmonary/Chest:  Rales bilateral bases.  Abdominal: There is no tenderness.  Musculoskeletal:       Right lower leg: He exhibits edema.       Left lower leg: He exhibits edema.  Neurological: He is alert.  Skin: Capillary refill takes less than 2 seconds.  Psychiatric: He has a normal mood and affect.     ED Treatments / Results  Labs (all labs ordered are listed, but only abnormal results are displayed) Labs Reviewed  BASIC METABOLIC PANEL - Abnormal; Notable for the following components:      Result Value   CO2 33 (*)    Glucose, Bld 208 (*)    All other components within normal limits  CBC - Abnormal; Notable for the following components:   RBC 4.12 (*)    Hemoglobin 12.4 (*)    All other components within normal  limits  BRAIN NATRIURETIC PEPTIDE - Abnormal; Notable for the following components:   B Natriuretic Peptide 595.5 (*)    All other components within normal limits  I-STAT TROPONIN, ED - Abnormal; Notable for the following components:   Troponin i, poc 0.23 (*)    All other components within normal limits    EKG EKG Interpretation  Date/Time:  Saturday March 25 2018 11:55:09 EDT Ventricular Rate:  55 PR Interval:    QRS Duration: 92 QT Interval:  456 QTC Calculation: 436 R Axis:   46 Text Interpretation:  with 2nd degree A-V block (Mobitz I) Septal infarct , age undetermined ST & T wave abnormality, consider lateral ischemia Abnormal ECG Confirmed by Davonna Belling [phone]) on 03/25/2018 12:27:59 PM   Radiology Dg Chest 2 View  Result Date: 03/25/2018 CLINICAL DATA:  Pt presents for evaluation of sob, 5 lb weight gain overnight and palpitations. Hx of afib, on elliquis. Hx of CHF. EXAM: CHEST - 2 VIEW COMPARISON:  09/06/2017 FINDINGS: There stable changes from prior CABG surgery. The cardiac silhouette is normal in size. No mediastinal or hilar masses or evidence of adenopathy. Linear and reticular opacity is noted in the left upper lobe lingula consistent with scarring, stable. Lungs are otherwise clear. No pleural effusion or pneumothorax. There is a mild compression fracture the upper lumbar spine, stable. No acute skeletal abnormality. IMPRESSION: No acute cardiopulmonary disease. No evidence of congestive heart failure. Electronically Signed   By: Lajean Manes M.D.   On: 03/25/2018 12:47    Procedures Procedures (including critical care time)  Medications Ordered in ED Medications - No data to display   Initial Impression / Assessment and Plan / ED Course  I have reviewed the triage vital signs and the nursing notes.  Pertinent labs & imaging results that were available during my care of the patient were reviewed by me and considered in my medical decision making (see  chart  for details).     Patient with shortness of  breath.  Dyspnea on exertion.  Orthopnea.  Mildly elevated troponin.  Pain-free at rest.  X-ray reassuring but BNP is elevated and has a weight that is increased.  EKG does show secondary heart block type I.  He has had this previously.  Final Clinical Impressions(s) / ED Diagnoses   Final diagnoses:  Acute on chronic congestive heart failure, unspecified heart failure type [facility])  Mobitz type 1 second degree atrioventricular block    ED Discharge Orders    None       Davonna Belling, MD 03/25/18 1427

## 2018-03-25 NOTE — Consult Note (Addendum)
Cardiology Consultation:   Patient ID: Charles [name]; [id number]; 07-08-1932   Admit date: 03/25/2018 Date of Consult: 03/25/2018  Primary Care Provider: [name], MD Primary Cardiologist: Dr. [name]    Patient Profile:   Charles [name] is a 84 y.o. male with a hx of  aortic stenosis, atrial flutter, history of Mobitz 1 AV block, history of branch retinal vein occlusion, CAD s/p CABG in 2004 with LIMA to LAD, SVG to RCA, SVG to OM, SVG to RI, carotid artery disease s/p left CEA, COPD, GERD, hypertension, hyperlipidemia, renal artery stenosis and DM II, who is being seen today for the evaluation of acute on chronic diastolic CHF,  at the request of Dr. Jonnie Finner, Internal Medicine.  History of Present Illness:   Charles [name] is a 84 y.o. male, followed by Dr. [name], with PMH of aortic stenosis, atrial flutter, history of Mobitz 1 AV block, history of branch retinal vein occlusion, CAD s/p CABG in 2004 with LIMA to LAD, SVG to RCA, SVG to OM, SVG to RI, carotid artery disease s/p left CEA, COPD, GERD, hypertension, hyperlipidemia, renal artery stenosis and DM II.  He has a 70% bilateral renal artery stenosis.  He previously had DCCV for atrial fibrillation in September 2016.  He was placed on Eliquis however ultimately decided not to take this drug for various reason.  Echocardiogram performed in July 2016 was also significant for moderate to severe aortic stenosis with valve area of 0.9 cm.  Last TEE guided DCCV was in January 2018.  He was evaluated by Dr. Burt Knack for TAVR in January 2019 and underwent left and right heart cath by Dr. Angelena Form a week later revealing patent grafts with at most, moderate aortic stenosis.  Therefore the plan for TAVR is canceled at this time. His AS is being followed.   He presented to the [facility] ED today with complaint of worsening exertional dyspnea, decreased exercise tolerance, fatigue, LEE, and weigh gain. He monitors his weight daily and has  noticed a steady increase over the last several days and 5 lb weight gain in the last 24 hrs. His normal dry home weight is 200-203 lb. This morning he weighed 213 lb at home. He reports full med compliance, but notes that his home diuretics were recently reduced to half due to abnormal renal function. His SCr had increased from 1.1 to 1.4. His family also admits to dietary indiscretion with sodium. He eats out at restaurants frequently. He also notes an episode of left sided chest pressure 3 weeks ago that occurred at rest. Lasted < 5 min and resolved spontaneously. He denies any recurrent CP. No syncope or near syncope.     Past Medical History:  Diagnosis Date  . Abdominal aortic atherosclerosis ([place])    by xray  . Aortic stenosis 06/2014   moderate-severe  . Arthritis    in lower back (06/24/2015)  . Atrial flutter ([place])    notes 06/24/2015  . AV block, Mobitz 1 09/06/2017   Archie Endo 09/06/2017  . 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 CAD (coronary artery disease) 2004   a. s/p CABG in 2004 with LIMA-LAD, SVG-RCA, SVG-OM, and SVG-RI  . Carotid stenosis 1999   s/p L CEA  . CHF (congestive heart failure) ([place]) dx'd 06/2015   a. EF 55-60% by echo in 2016. b. 10/2016: EF reduced to 30-35% by TEE in the setting of atrial fibrillation with RVR  . Colon polyp 2005   (  Dr. Tiffany Kocher)  . Compression fracture of L1 lumbar vertebra (HCC) remote  . COPD (chronic obstructive pulmonary disease) ([place]) 03/2011   by xray  . Depression    hx  . Ex-smoker   . GERD (gastroesophageal reflux disease) 2003   h/o duodenal ulcer per EGD as well as esophagitis  . History of colon polyps 2003, 2005   adenomatous Vira Agar)  . HLD (hyperlipidemia)   . HTN (hypertension)   . Psoriasis   . Renal artery stenosis ([place]) 2004   70% bilateral, followed by cards  . T2DM (type 2 diabetes mellitus) ([place]) 1995  . Urge incontinence of urine     Past Surgical History:  Procedure Laterality  Date  . CARDIAC CATHETERIZATION  03/06/2003   No intervention - recommend CABG  . CARDIOVASCULAR STRESS TEST  11/2010   normal perfusion, no evidence of ischemia, EF 62% post exercise  . CARDIOVASCULAR STRESS TEST  11/28/2012   Mild diaphragmatic attenuation; cannot exclude a focal region of nontransmural inferior scar  . CARDIOVERSION N/A 06/25/2015   Procedure: CARDIOVERSION;  Surgeon: Pixie Casino, MD;  Location: [facility] ENDOSCOPY;  Service: Cardiovascular;  Laterality: N/A;  . CARDIOVERSION N/A 11/02/2016   Procedure: CARDIOVERSION;  Surgeon: [name], MD;  Location: [place];  Service: Cardiovascular;  Laterality: N/A;  . CAROTID ENDARTERECTOMY Left 1999   ([place])  . CATARACT EXTRACTION W/ INTRAOCULAR LENS  IMPLANT, BILATERAL Bilateral 01/2013   [place]  . COLONOSCOPY  2003   colon polyp x3 - adenomatous Tiffany Kocher)  . COLONOSCOPY  10/08/2012   2 TA, diverticulosis, int hem, no rpt rec Tiffany Kocher)  . CORONARY ANGIOPLASTY    . CORONARY ARTERY BYPASS GRAFT  03/07/2003   4v CABG ([place]) with LIMA to LAD, vein graft to RCA, 1st obtuse marginal, and ramus intermedius  . ESOPHAGOGASTRODUODENOSCOPY  10/08/2012   nl esophagus, duodenitis and erosive gastropathy, path - gastropathy no Hpylori, no rpt rec  . LUMBAR EPIDURAL INJECTION  03/2017   L5/S1 ([place])  . PERIPHERAL VASCULAR CATHETERIZATION N/A 02/02/2016   Procedure: Renal Angiography;  Surgeon: [name], MD;  Location: [place] CV LAB;  Service: Cardiovascular;  Laterality: N/A;  . PERIPHERAL VASCULAR CATHETERIZATION N/A 02/02/2016   Procedure: Abdominal Aortogram;  Surgeon: [name], MD;  Location: [place] CV LAB;  Service: Cardiovascular;  Laterality: N/A;  . RENAL DOPPLER  11/29/2011   Celiac&SMA-demonstrated vessel narrowing suggestive of a greater than 50% diameter reduction. Bilateral renal arteries-demonstrated vessel narrowing of 60-99% diameter reduction. Rt Kidney-mid pole lateral simple cyst noted measuring  1.29x0.76x1.11cm and exophytic cyst outside lower pole measuring 1.23x0.96x1.31. Lft Kidney-lateral mid to lower pole simple cyst measuering-1.24x9.83x1.24  . RIGHT/LEFT HEART CATH AND CORONARY/GRAFT ANGIOGRAPHY N/A 10/20/2017   Procedure: RIGHT/LEFT HEART CATH AND CORONARY/GRAFT ANGIOGRAPHY;  Surgeon: Burnell Blanks, MD;  Location: [place] CV LAB;  Service: Cardiovascular;  Laterality: N/A;  . TEE WITHOUT CARDIOVERSION N/A 06/25/2015   Procedure: TRANSESOPHAGEAL ECHOCARDIOGRAM (TEE);  Surgeon: Pixie Casino, MD;  Location: [facility] ENDOSCOPY;  Service: Cardiovascular;  Laterality: N/A;  . TEE WITHOUT CARDIOVERSION N/A 11/02/2016   Procedure: TRANSESOPHAGEAL ECHOCARDIOGRAM (TEE);  Surgeon: [name], MD;  Location: [facility] ENDOSCOPY;  Service: Cardiovascular;  Laterality: N/A;  . TONSILLECTOMY    . UPPER GASTROINTESTINAL ENDOSCOPY  2003   reflux esophagitis, erosive gastropathy, duodenal ulcer     Home Medications:  Prior to Admission medications   Medication Sig Start Date End Date Taking? Authorizing Provider  acetaminophen (TYLENOL) 500 MG tablet Take 1  tablet (500 mg total) by mouth 3 (three) times daily. Patient taking differently: Take 500 mg by mouth daily.  02/17/16  Yes [name], MD  atorvastatin (LIPITOR) 20 MG tablet TAKE 1 TABLET DAILY. 01/12/18  Yes [name], MD  carvedilol (COREG) 3.125 MG tablet Take 1 tablet (3.125 mg total) by mouth 2 (two) times daily. 10/06/17 03/25/18 Yes [name], MD  citalopram (CELEXA) 20 MG tablet Take 1 tablet (20 mg total) by mouth daily. 11/08/17  Yes [name], MD  ELIQUIS 5 MG TABS tablet Take 1 tablet (5 mg total) by mouth 2 (two) times daily. 12/13/17  Yes [name], MD  finasteride (PROSCAR) 5 MG tablet Take 1 tablet (5 mg total) by mouth daily. 02/15/18  Yes [name], MD  LANTUS 100 UNIT/ML injection Inject 0.35 mLs (35 Units total) into the skin at bedtime. 02/24/18  Yes [name], MD    lisinopril (PRINIVIL,ZESTRIL) 40 MG tablet Take 1 tablet (40 mg total) by mouth daily. 01/02/18  Yes [name], MD  metFORMIN (GLUCOPHAGE) 1000 MG tablet Take 1 tablet (1,000 mg total) by mouth 2 (two) times daily with a meal. 03/06/18  Yes [name], MD  saw palmetto 160 MG capsule Take 320 mg by mouth daily.    Yes [provider]  tamsulosin (FLOMAX) 0.4 MG CAPS capsule Take 1 capsule (0.4 mg total) by mouth daily after supper. 03/13/18  Yes [name], MD  torsemide (DEMADEX) 20 MG tablet Take 2 tablets (40 mg total) by mouth 2 (two) times daily. Patient taking differently: Take 20 mg by mouth 2 (two) times daily.  09/22/17  Yes Barrett, Evelene Croon, PA-C  traZODone (DESYREL) 50 MG tablet Take 2 tablets (100 mg total) by mouth at bedtime. 01/23/18  Yes [name], MD  ciprofloxacin (CIPRO) 250 MG tablet Take 1 tablet (250 mg total) by mouth 2 (two) times daily. 01/31/18   [name], MD  nitroGLYCERIN (NITROSTAT) 0.4 MG SL tablet Place 1 tablet (0.4 mg total) under the tongue every 5 (five) minutes as needed for chest pain. 03/08/18 06/06/18  Almyra Deforest, PA    Inpatient Medications: Scheduled Meds: . acetaminophen  500 mg Oral Daily  . apixaban  5 mg Oral BID  . [START ON 03/26/2018] atorvastatin  20 mg Oral Daily  . [START ON 03/26/2018] citalopram  20 mg Oral Daily  . [START ON 03/26/2018] finasteride  5 mg Oral Daily  . insulin aspart  0-5 Units Subcutaneous QHS  . insulin aspart  0-9 Units Subcutaneous TID WC  . insulin glargine  35 Units Subcutaneous QHS  . [START ON 03/26/2018] lisinopril  40 mg Oral Daily  . sodium chloride flush  3 mL Intravenous Q12H  . tamsulosin  0.4 mg Oral QPC supper  . traZODone  100 mg Oral QHS   Continuous Infusions: . sodium chloride     PRN Meds: sodium chloride, acetaminophen, ondansetron (ZOFRAN) IV, sodium chloride flush  Allergies:    Allergies  Allergen Reactions  . Gabapentin Other (See Comments)    Leg  pain, weakness   . Metoprolol Swelling  . Spironolactone Other (See Comments)    Painful gynecomastia  . Amlodipine Other (See Comments)    Edema  . Other Other (See Comments)    Horse serum - myalgias  . Rosiglitazone Maleate Other (See Comments)    REACTION: Did not help  . Tricor [Fenofibrate] Other (See Comments)    myalgias    Social History:   Social  History   Socioeconomic History  . Marital status: Widowed    Spouse name: Not on file  . Number of children: Not on file  . Years of education: Not on file  . Highest education level: Not on file  Occupational History  . Not on file  Social Needs  . Financial resource strain: Not on file  . Food insecurity:    Worry: Not on file    Inability: Not on file  . Transportation needs:    Medical: Not on file    Non-medical: Not on file  Tobacco Use  . Smoking status: Former Smoker    Packs/day: 0.50    Years: 36.00    Pack years: 18.00    Types: Cigarettes    Last attempt to quit: 07/23/2013    Years since quitting: 4.6  . Smokeless tobacco: Never Used  Substance and Sexual Activity  . Alcohol use: No  . Drug use: No  . Sexual activity: Not Currently  Lifestyle  . Physical activity:    Days per week: Not on file    Minutes per session: Not on file  . Stress: Not on file  Relationships  . Social connections:    Talks on phone: Not on file    Gets together: Not on file    Attends religious service: Not on file    Active member of club or organization: Not on file    Attends meetings of clubs or organizations: Not on file    Relationship status: Not on file  . Intimate partner violence:    Fear of current or ex partner: Not on file    Emotionally abused: Not on file    Physically abused: Not on file    Forced sexual activity: Not on file  Other Topics Concern  . Not on file  Social History Narrative   Caffeine: 4 cups coffee/day, some tea and soda   Lives with wife   Occupation: Retired   Activity: no  regular exercise   Diet: good water, fruits/vegetables daily    Family History:    Family History  Problem Relation Age of Onset  . Hypertension Father   . Heart disease Father   . Cancer Mother        GI cancer  . Diabetes Sister   . Heart disease Sister   . Stroke Sister   . Hypertension Sister   . Cancer Brother        Lung cancer - nonsmoker  . Cancer Maternal Grandmother   . Heart attack Maternal Grandfather   . Heart attack Paternal Grandmother   . Heart attack Paternal Grandfather   . Cancer Brother        Pancreatic cancer  . Lung disease Brother      ROS:  Please see the history of present illness.   All other ROS reviewed and negative.     Physical Exam/Data:   Vitals:   03/25/18 1157 03/25/18 1233 03/25/18 1605  BP: (!) 166/95 (!) 184/91 (!) 164/68  Pulse: (!) 52 (!) 140 (!) 49  Resp: (!) 22 (!) 21 18  Temp: 97.7 F (36.5 C)  97.8 F (36.6 C)  TempSrc: Oral  Oral  SpO2: 97% 99% 100%  Weight:   209 lb 12.8 oz (95.2 kg)  Height:   6\' 1"  (1.854 m)    Intake/Output Summary (Last 24 hours) at 03/25/2018 1622 Last data filed at 03/25/2018 1534 Gross per 24 hour  Intake -  Output 325  ml  Net -325 ml   Filed Weights   03/25/18 1605  Weight: 209 lb 12.8 oz (95.2 kg)   Body mass index is 27.68 kg/m.  General:  Elderly white male, in no acute distress HEENT: normal Lymph: no adenopathy Neck: + JVD at level of the ear Endocrine:  No thryomegaly Vascular: No carotid bruits; FA pulses 2+ bilaterally without bruits  Cardiac:  Bradycardia, 3/6 SM heard throughout precordium  Lungs:  Decreased BS at bases Abd: soft, nontender, no hepatomegaly  Ext: trace bilateral LEE Musculoskeletal:  No deformities, BUE and BLE strength normal and equal Skin: warm and dry  Neuro:  CNs 2-12 intact, no focal abnormalities noted Psych:  Normal affect   EKG:  The EKG was personally reviewed and demonstrates:   Telemetry:  Telemetry was personally reviewed and  demonstrates:  Second degree AVB mobitz 1  Relevant CV Studies: 2D Echo 08/08/17  Study Conclusions  - Left ventricle: The cavity size was normal. There was severe   concentric hypertrophy. Systolic function was normal. The   estimated ejection fraction was in the range of 55% to 60%. Wall   motion was normal; there were no regional wall motion   abnormalities. Features are consistent with a pseudonormal left   ventricular filling pattern, with concomitant abnormal relaxation   and increased filling pressure (grade 2 diastolic dysfunction).   Doppler parameters are consistent with high ventricular filling   pressure. - Aortic valve: Right coronary cusp immobility was noted. There was   moderate to severe stenosis. There was trivial regurgitation.   Mean gradient (S): 32 mm Hg. - Aorta: Ascending aortic diameter: 41 mm (S). - Ascending aorta: The ascending aorta was mildly dilated. - Mitral valve: Severely calcified annulus. Diffuse calcification   of the anterior leaflet. There was mild regurgitation. - Left atrium: The atrium was mildly dilated. - Tricuspid valve: There was trivial regurgitation. - Pulmonary arteries: PA peak pressure: 39 mm Hg (S).  Impressions:  - The right ventricular systolic pressure was increased consistent   with mild pulmonary hypertension.  R/LHC 10/20/17 Procedures   RIGHT/LEFT HEART CATH AND CORONARY/GRAFT ANGIOGRAPHY  Conclusion     Ost RCA to Prox RCA lesion is 100% stenosed.  SVG graft was visualized by angiography and is normal in caliber.  The graft exhibits minimal luminal irregularities.  SVG graft was visualized by angiography and is normal in caliber.  The graft exhibits no disease.  SVG graft was visualized by angiography and is normal in caliber.  The graft exhibits no disease.  LIMA graft was visualized by non-selective angiography and is normal in caliber.  The graft exhibits no disease.  Ost Cx to Prox Cx lesion is  99% stenosed.  Ost 1st Mrg lesion is 99% stenosed.  Ost 2nd Mrg lesion is 100% stenosed.  Ost LAD to Prox LAD lesion is 99% stenosed.  Ost 1st Diag lesion is 50% stenosed.  Hemodynamic findings consistent with mild pulmonary hypertension.   1. Severe triple vessel CAD with 4/4 patent bypass grafts 2. Severe stenosis proximal LAD. The mid and distal LAD filled from the patent LIMA graft.  3 Severe stenosis proximal Circumflex involving the intermediate branch and the first OM. The vein graft to the intermediate branch is patent. The vein graft to the first OM branch is patent.  4. Chronic occlusion of the proximal RCa. The vein graft to the distal RCA is patent.  5. Aortic valve stenosis (mean gradient 21.5 mmHg, peak to peak gradient 23  mmHg, AVA 1.12 cm2).   Recommendations: I will review his data with Dr. Burt Knack and the TAVR team. Will proceed with planning for TAVR.      Laboratory Data:  Chemistry Recent Labs  Lab 03/25/18 1214  NA 143  K 4.1  CL 101  CO2 33*  GLUCOSE 208*  BUN 20  CREATININE 1.09  CALCIUM 9.3  GFRNONAA >60  GFRAA >60  ANIONGAP 9    No results for input(s): PROT, ALBUMIN, AST, ALT, ALKPHOS, BILITOT in the last 168 hours. Hematology Recent Labs  Lab 03/25/18 1214  WBC 6.8  RBC 4.12*  HGB 12.4*  HCT 39.3  MCV 95.4  MCH 30.1  MCHC 31.6  RDW 12.9  PLT 158   Cardiac EnzymesNo results for input(s): TROPONINI in the last 168 hours.  Recent Labs  Lab 03/25/18 1229  TROPIPOC 0.23*    BNP Recent Labs  Lab 03/25/18 1214  BNP 595.5*    DDimer No results for input(s): DDIMER in the last 168 hours.  Radiology/Studies:  Dg Chest 2 View  Result Date: 03/25/2018 CLINICAL DATA:  Pt presents for evaluation of sob, 5 lb weight gain overnight and palpitations. Hx of afib, on elliquis. Hx of CHF. EXAM: CHEST - 2 VIEW COMPARISON:  09/06/2017 FINDINGS: There stable changes from prior CABG surgery. The cardiac silhouette is normal in size. No  mediastinal or hilar masses or evidence of adenopathy. Linear and reticular opacity is noted in the left upper lobe lingula consistent with scarring, stable. Lungs are otherwise clear. No pleural effusion or pneumothorax. There is a mild compression fracture the upper lumbar spine, stable. No acute skeletal abnormality. IMPRESSION: No acute cardiopulmonary disease. No evidence of congestive heart failure. Electronically Signed   By: Lajean Manes M.D.   On: 03/25/2018 12:47    Assessment and Plan:   1. Acute on Chronic Diastolic CHF: pt notes 04-54 lb weight gain above dry weight (normal 200-203 lb). Pt was 213 lb today at home. Also with worsening exertional dyspnea, orthopnea and PND. This is in the setting of recent decrease in home torsemide dose due to rising SCc (1.1>>1.4 but back to WNL today at 1.09). He also admits to dietary indiscretion with sodium (eats out frequently at restaurants). BNP is 595. We will treat with IV Lasix and diureses back to dry weight. Will need to monitor BP closely, to avoid hypotension given his aortic stenosis. Will also monitor renal function and K. We will likely need to allow for higher SCr to ensure that he remains on an adequate dose of diuretic to prevent recurrent acute CHF exacerbations. Will order low sodium diet. Given moderate AS, we will also update 2D echo. We will give 80 mg IV Lasix this evening and reassess in the AM. We will recommend a sliding scale diuretic regimen when discharged.   2. Moderate Aortic Stenosis: being monitored by Dr. Burt Knack in the valve clinic. Moderate at last assessment. Murmur loud on exam. Concerns about disease progression. Will update echo. Monitor BP closely w/ diuresis.   3. CAD: h/o CABG. LHC 10/2017 showed patent grafts. Continue medical therapy.   4. H/o Atrial Flutter: sinus brady on tele. Pt has refused anticoagulation.   5. Bradycardia: 2nd degree Mobitz I AV block. HR in the 50s. Avoid BBs.   6. DM: per IM.   7.  HTN: elevated, but also in the setting of volume overload. Will give dose of IV Lasix, 80 mg x 1 today. Monitor closely and avoid hypotension  given severity of AS.    For questions or updates, please contact [place] Please consult [email or web address] for contact info under Cardiology/STEMI.   Signed, Lyda Jester, PA-C  03/25/2018 4:22 PM   I have seen and examined the patient along with Lyda Jester, PA-C .  I have reviewed the chart, notes and new data.  I agree with PA's note.  Key new complaints: severe dyspnea at rest. Appears to have a very narrow range of compensation between "too dry" and "too wet". I believe he will need a "sliding scale" of diuretic. Key examination changes: JVD, rales, tachypnea, loud 3/6 mid-peaking AS murmur. Bradycardia. Key new findings / data: creatinine normal , better than baseline and substantially better than earlier this month.  PLAN: Furosemide 80 mg IV now. Anticipate an OP Rx similar to the following: - torsemide 40 mg  BID if weight >205 lb - torsemide 20 mg BID if weight 200-205 lb (on home scale - our office scale seems to read 3 lb higher)  Reevaluate AS by echo. Avoid negative chronotropic agents. May soon need TAVR and dual chamber PM.   Sanda Klein, MD, [place] [phone] 03/25/2018, 5:33 PM

## 2018-03-25 NOTE — ED Triage Notes (Signed)
Pt presents for evaluation of sob, 5 lb weight gain overnight and palpitations. Hx of afib, on elliquis. Hx of CHF. Denies CP. Reports exertional SOB.

## 2018-03-26 ENCOUNTER — Inpatient Hospital Stay (HOSPITAL_COMMUNITY): Payer: Medicare Other

## 2018-03-26 DIAGNOSIS — I340 Nonrheumatic mitral (valve) insufficiency: Secondary | ICD-10-CM

## 2018-03-26 LAB — BASIC METABOLIC PANEL
ANION GAP: 7 (ref 5–15)
BUN: 18 mg/dL (ref 6–20)
CALCIUM: 8.8 mg/dL — AB (ref 8.9–10.3)
CO2: 32 mmol/L (ref 22–32)
Chloride: 103 mmol/L (ref 101–111)
Creatinine, Ser: 1.05 mg/dL (ref 0.61–1.24)
GFR calc Af Amer: >60 mL/min (ref >60)
GFR calc non Af Amer: >60 mL/min (ref >60)
Glucose, Bld: 116 mg/dL — ABNORMAL HIGH (ref 65–99)
Potassium: 3.4 mmol/L — ABNORMAL LOW (ref 3.5–5.1)
Sodium: 142 mmol/L (ref 135–145)

## 2018-03-26 LAB — TROPONIN I: Troponin I: 0.06 ng/mL (ref <0.03)

## 2018-03-26 LAB — MAGNESIUM: MAGNESIUM: 1.6 mg/dL — AB (ref 1.7–2.4)

## 2018-03-26 LAB — GLUCOSE, CAPILLARY
GLUCOSE-CAPILLARY: 169 mg/dL — AB (ref 65–99)
Glucose-Capillary: 113 mg/dL — ABNORMAL HIGH (ref 65–99)
Glucose-Capillary: 170 mg/dL — ABNORMAL HIGH (ref 65–99)
Glucose-Capillary: 186 mg/dL — ABNORMAL HIGH (ref 65–99)

## 2018-03-26 LAB — ECHOCARDIOGRAM COMPLETE
HEIGHTINCHES: 73 in
Weight: 3345.6 oz

## 2018-03-26 MED ORDER — POTASSIUM CHLORIDE CRYS ER 20 MEQ PO TBCR
40.0000 meq | EXTENDED_RELEASE_TABLET | Freq: Once | ORAL | Status: AC
  Administered 2018-03-26: 40 meq via ORAL
  Filled 2018-03-26: qty 2

## 2018-03-26 MED ORDER — MAGNESIUM SULFATE 2 GM/50ML IV SOLN
2.0000 g | Freq: Once | INTRAVENOUS | Status: AC
  Administered 2018-03-26: 2 g via INTRAVENOUS
  Filled 2018-03-26 (×2): qty 50

## 2018-03-26 NOTE — Progress Notes (Addendum)
Patient slept during the night. No pain. Report that he feels better this morning.HR 39 bpm while patient was asleep, patient remained asymptomatic.  Urania Pearlman, RN

## 2018-03-26 NOTE — Evaluation (Signed)
Physical Therapy Evaluation Patient Details Name: Charles [name] MRN: [id number] DOB: [birth year] Today's Date: 03/26/2018   History of Present Illness  Charles [name] is a 84 y.o. male with a hx of  aortic stenosis, atrial flutter, history of Mobitz 1 AV block, history of branch retinal vein occlusion, CAD s/p CABG in 2004 with LIMA to LAD, SVG to RCA, SVG to OM, SVG to RI, carotid artery disease s/p left CEA, COPD, GERD, hypertension, hyperlipidemia, renal artery stenosis and DM II, who is being seen for the evaluation of acute on chronic diastolic CHF,  at the request of Dr. Jonnie Finner, Internal Medicine.  Clinical Impression  Pt admitted with above diagnosis. Pt currently with functional limitations due to the deficits listed below (see PT Problem List). Pt was able to stand but needed mod assist with very poor posture.  Will need therapy prior to d/c home as he lives alone.HR 58 bpm to 94 bpm with activity.   Recommend SNF.   Pt will benefit from skilled PT to increase their independence and safety with mobility to allow discharge to the venue listed below.    SATURATION QUALIFICATIONS: (This note is used to comply with regulatory documentation for home oxygen)  Patient Saturations on Room Air at Rest = 87%  Patient Saturations on Room Air while Ambulating = 85%  Patient Saturations on 2 Liters of oxygen while Ambulating = 95%  Please briefly explain why patient needs home oxygen:Needed O2 to keep sats above 90%.  May need home O2.  Follow Up Recommendations SNF;Supervision/Assistance - 24 hour    Equipment Recommendations  ?home O2    Recommendations for Other Services       Precautions / Restrictions Precautions Precautions: Fall Restrictions Weight Bearing Restrictions: No      Mobility  Bed Mobility Overal bed mobility: Needs Assistance Bed Mobility: Supine to Sit     Supine to sit: Mod assist     General bed mobility comments: Needed assist for LEs and for  elevation of trunk.   Transfers Overall transfer level: Needs assistance Equipment used: Rolling walker (2 wheeled) Transfers: Sit to/from Stand Sit to Stand: Mod assist         General transfer comment: Pt needed assist to power up.  Pt needed assist to stand as he maintained a flexed posture.   Pt could not achieve full upright stance even with cues.   Ambulation/Gait Ambulation/Gait assistance: Mod assist Gait Distance (Feet): 3 Feet Assistive device: Rolling walker (2 wheeled) Gait Pattern/deviations: Step-to pattern;Decreased step length - right;Decreased step length - left;Decreased stride length;Shuffle;Staggering left;Staggering right;Drifts right/left;Trunk flexed;Wide base of support   Gait velocity interpretation: <1.31 ft/sec, indicative of household ambulator General Gait Details: Pt took a few steps forward and back with mod assist to steady pt.  Pt having difficulty standing tall and sat abruptly onto bed needing assist to control descent.    Stairs            Wheelchair Mobility    Modified Rankin (Stroke Patients Only)       Balance Overall balance assessment: Needs assistance Sitting-balance support: No upper extremity supported;Feet supported Sitting balance-Leahy Scale: Fair     Standing balance support: Bilateral upper extremity supported;During functional activity Standing balance-Leahy Scale: Poor Standing balance comment: Needed mod assist to steady with RW support with significant flexed posture.  Pertinent Vitals/Pain Pain Assessment: No/denies pain    Home Living Family/patient expects to be discharged to:: Private residence Living Arrangements: Alone Available Help at Discharge: Family;Available PRN/intermittently Type of Home: House Home Access: Level entry     Home Layout: One level Home Equipment: Walker - 4 wheels;Shower seat;Cane - single point;Grab bars - tub/shower Additional  Comments: Son lives very close by and checks on pt as needed.    Prior Function Level of Independence: Independent with assistive device(s)         Comments: used rollator al the time per pt     Hand Dominance   Dominant Hand: Right    Extremity/Trunk Assessment   Upper Extremity Assessment Upper Extremity Assessment: Defer to OT evaluation    Lower Extremity Assessment Lower Extremity Assessment: Overall WFL for tasks assessed    Cervical / Trunk Assessment Cervical / Trunk Assessment: Kyphotic  Communication   Communication: HOH  Cognition Arousal/Alertness: Awake/alert Behavior During Therapy: Flat affect Overall Cognitive Status: Within Functional Limits for tasks assessed                                        General Comments      Exercises General Exercises - Lower Extremity Ankle Circles/Pumps: AROM;Both;10 reps;Supine Long Arc Quad: AROM;Both;10 reps;Seated   Assessment/Plan    PT Assessment Patient needs continued PT services  PT Problem List Decreased activity tolerance;Decreased balance;Decreased mobility;Decreased knowledge of use of DME;Decreased safety awareness;Decreased knowledge of precautions;Cardiopulmonary status limiting activity;Obesity       PT Treatment Interventions DME instruction;Gait training;Functional mobility training;Therapeutic activities;Therapeutic exercise;Balance training;Patient/family education    PT Goals (Current goals can be found in the Care Plan section)  Acute Rehab PT Goals Patient Stated Goal: to go home PT Goal Formulation: With patient Time For Goal Achievement: 04/09/18 Potential to Achieve Goals: Good    Frequency Min 3X/week   Barriers to discharge Decreased caregiver support      Co-evaluation               AM-PAC PT "6 Clicks" Daily Activity  Outcome Measure Difficulty turning over in bed (including adjusting bedclothes, sheets and blankets)?: Unable Difficulty moving  from lying on back to sitting on the side of the bed? : Unable Difficulty sitting down on and standing up from a chair with arms (e.g., wheelchair, bedside commode, etc,.)?: A Lot Help needed moving to and from a bed to chair (including a wheelchair)?: A Lot Help needed walking in hospital room?: Total Help needed climbing 3-5 steps with a railing? : Total 6 Click Score: 8    End of Session Equipment Utilized During Treatment: Gait belt;Oxygen Activity Tolerance: Patient limited by fatigue Patient left: in bed;with call bell/phone within reach;with bed alarm set Nurse Communication: Mobility status PT Visit Diagnosis: Unsteadiness on feet (R26.81);Muscle weakness (generalized) (M62.81);Repeated falls (R29.6)    Time: 6812-7517 PT Time Calculation (min) (ACUTE ONLY): 13 min   Charges:   PT Evaluation $PT Eval Moderate Complexity: 1 Mod     PT G Codes:        Haneefah Venturini,PT Acute Rehabilitation [phone] (pager)   Denice Paradise 03/26/2018, 12:44 PM

## 2018-03-26 NOTE — Progress Notes (Signed)
  Echocardiogram 2D Echocardiogram has been performed.  Bobbye Charleston 03/26/2018, 3:15 PM

## 2018-03-26 NOTE — Progress Notes (Signed)
Patient HR dropping down to 37 bpm while asleep. Patient is asymptomatic, VS stable. NP on call [place] X notified.  Will continue to monitor.  Robyn Nohr, RN

## 2018-03-26 NOTE — Progress Notes (Signed)
Patient Demographics:    Charles [name], is a 84 y.o. male, DOB - 05-Jul-1932, MLJ:[id number]  Admit date - 03/25/2018   Admitting Physician Roney Jaffe, MD  Outpatient Primary MD for the patient is [name], MD  LOS - 1   Chief Complaint  Patient presents with  . Shortness of Breath        Subjective:    [name] today has no fevers, no emesis,  No chest pain shortness of breath is better,  Assessment  & Plan :    Principal Problem:   Acute on chronic diastolic CHF (congestive heart failure) (HCC) Active Problems:   Diabetes mellitus type 2, uncontrolled, with complications (HCC)   Essential hypertension   Cardiovascular disease   Aortic stenosis, moderate   Bradycardia   Coronary artery disease involving native coronary artery of native heart without angina pectoris   Elevated troponin   Heart block   Atrial flutter ([place])  Brief Summary:- 84 y.o. male with medical history significant diabetes, flutter on Eliquis, diabetes, hypertension, aortic stenosis, Mobitz 1 AV block, CAD status post CABG 2004 GERD, hyperlipidemia admitted on 03/25/2018 with weight gain, shortness of breath orthopnea, lower extremity edema consistent with CHF exacerbation in the setting of bradycardia and aortic stenosis   Plan:- 1)HFpEF--- shortness of breath is better, patient was admitted with acute on chronic diastolic dysfunction CHF in the setting of bradycardia (Mobitz type I AV block) and aortic stenosis, cardiology consult appreciated, Lasix IV for diuresis  2)Mobitz type I heart block--- cardiology consult appreciated, low-dose Coreg discontinued, ????  If candidate for pacemaker placement,   3)Aortic stenosis--previously documented as moderate, repeat echo pending, patient may need  TAVR, cardiology consult appreciated  4)DM2-last A1c was 8.7, continue Lantus 35 units nightly, Use  Novolog/Humalog Sliding scale insulin with Accu-Cheks/Fingersticks as ordered   5)HTN-Coreg discontinued due to bradycardia, torsemide on hold, currently on IV Lasix, continue lisinopril 40 mg daily  6)H/o A Flutter--status post prior cardioversion x2, Home medications includes Eliquis 5mg  twice daily  7)FEN--- hypokalemia and hypomagnesemia noted in the setting of diuretic use, patient denies alcohol use, replace potassium and magnesium  8)Disposition-upon discharge consider as per Dr. Lorenza Cambridge noted that he may need a sliding scale type of diuretic regimen as outpatient.  For instance: - torsemide 40 mg BID if weight >205 lb - torsemide 20 mg BID if weight 200-205 lb (on home scale - cardiology office scale seems to read 3 lb higher)  9)H/o CAD/CABG--- no ACS type symptoms at this time, last heart catheterization was January 2019 with patent grafts,  10)BPH with LUTs--- stable, continue Proscar and Flomax  Code Status : DNR  Disposition Plan  : TBD  Consults  :  Cardiology   DVT Prophylaxis  : Apixaban  Lab Results  Component Value Date   PLT 158 03/25/2018    Inpatient Medications  Scheduled Meds: . acetaminophen  500 mg Oral Daily  . apixaban  5 mg Oral BID  . atorvastatin  20 mg Oral Daily  . citalopram  20 mg Oral Daily  . finasteride  5 mg Oral Daily  . insulin aspart  0-5 Units Subcutaneous QHS  . insulin aspart  0-9 Units Subcutaneous TID WC  . insulin glargine  35 Units Subcutaneous QHS  . lisinopril  40 mg Oral Daily  . sodium chloride flush  3 mL Intravenous Q12H  . tamsulosin  0.4 mg Oral QPC supper  . traZODone  100 mg Oral QHS   Continuous Infusions: . sodium chloride    . magnesium sulfate 1 - 4 g bolus IVPB     PRN Meds:.sodium chloride, acetaminophen, ondansetron (ZOFRAN) IV, sodium chloride flush    Anti-infectives (From admission, onward)   None        Objective:   Vitals:   03/25/18 2346 03/26/18 0450 03/26/18 0547 03/26/18 1221    BP: (!) 114/47 127/68  138/76  Pulse: (!) 40 (!) 43  (!) 49  Resp: 20 14  20   Temp: 98.2 F (36.8 C) 98.2 F (36.8 C)  (!) 97.4 F (36.3 C)  TempSrc: Oral Oral  Oral  SpO2: 98% 98%  99%  Weight:   94.8 kg (209 lb 1.6 oz)   Height:   6\' 1"  (1.854 m)     Wt Readings from Last 3 Encounters:  03/26/18 94.8 kg (209 lb 1.6 oz)  03/08/18 93.4 kg (206 lb)  02/15/18 94.5 kg (208 lb 4 oz)     Intake/Output Summary (Last 24 hours) at 03/26/2018 1427 Last data filed at 03/26/2018 1342 Gross per 24 hour  Intake 1800 ml  Output 2215 ml  Net -415 ml     Physical Exam  Gen:- Awake Alert,  In no apparent distress  HEENT:- [place].AT, No sclera icterus Neck-Supple Neck,No JVD,.  Lungs-  CTAB , good air movement CV- S1, S2 normal, heart rate 52 , 3/6 systolic murmur abd-  +ve B.Sounds, Abd Soft, No tenderness,    Extremity/Skin:-Good pulses, improving lower extremity edema  psych-affect is appropriate, oriented x3 Neuro-no new focal deficits, no tremors   Data Review:   Micro Results No results found for this or any previous visit (from the past 240 hour(s)).  Radiology Reports Dg Chest 2 View  Result Date: 03/25/2018 CLINICAL DATA:  Pt presents for evaluation of sob, 5 lb weight gain overnight and palpitations. Hx of afib, on elliquis. Hx of CHF. EXAM: CHEST - 2 VIEW COMPARISON:  09/06/2017 FINDINGS: There stable changes from prior CABG surgery. The cardiac silhouette is normal in size. No mediastinal or hilar masses or evidence of adenopathy. Linear and reticular opacity is noted in the left upper lobe lingula consistent with scarring, stable. Lungs are otherwise clear. No pleural effusion or pneumothorax. There is a mild compression fracture the upper lumbar spine, stable. No acute skeletal abnormality. IMPRESSION: No acute cardiopulmonary disease. No evidence of congestive heart failure. Electronically Signed   By: Lajean Manes M.D.   On: 03/25/2018 12:47     CBC Recent Labs  Lab  03/25/18 1214  WBC 6.8  HGB 12.4*  HCT 39.3  PLT 158  MCV 95.4  MCH 30.1  MCHC 31.6  RDW 12.9    Chemistries  Recent Labs  Lab 03/25/18 1214 03/26/18 0604  NA 143 142  K 4.1 3.4*  CL 101 103  CO2 33* 32  GLUCOSE 208* 116*  BUN 20 18  CREATININE 1.09 1.05  CALCIUM 9.3 8.8*  MG  --  1.6*   ------------------------------------------------------------------------------------------------------------------ No results for input(s): CHOL, HDL, LDLCALC, TRIG, CHOLHDL, LDLDIRECT in the last 72 hours.  Lab Results  Component Value Date   HGBA1C 8.7 (H) 03/25/2018   ------------------------------------------------------------------------------------------------------------------ Recent Labs    03/25/18 1603  TSH 1.604   ------------------------------------------------------------------------------------------------------------------  No results for input(s): VITAMINB12, FOLATE, FERRITIN, TIBC, IRON, RETICCTPCT in the last 72 hours.  Coagulation profile No results for input(s): INR, PROTIME in the last 168 hours.  No results for input(s): DDIMER in the last 72 hours.  Cardiac Enzymes Recent Labs  Lab 03/25/18 1541 03/25/18 2106 03/26/18 0604  TROPONINI 0.06* 0.05* 0.06*   ------------------------------------------------------------------------------------------------------------------    Component Value Date/Time   BNP 595.5 (H) 03/25/2018 1214     Tavio Biegel M.D on 03/26/2018 at 2:27 PM  Between 7am to 7pm - Pager - [phone]  After 7pm go to [email or web address] - password [email or web address]  [facility] -  Office  [phone]   Voice Recognition Viviann Spare dictation system was used to create this note, attempts have been made to correct errors. Please contact the author with questions and/or clarifications.

## 2018-03-26 NOTE — Progress Notes (Signed)
Progress Note  Patient Name: Charles [name] Date of Encounter: 03/26/2018  Primary Cardiologist: No primary care provider on file.   Subjective   He feels a little bit better, no chest pain.  Still short of breath.    Inpatient Medications    Scheduled Meds: . acetaminophen  500 mg Oral Daily  . apixaban  5 mg Oral BID  . atorvastatin  20 mg Oral Daily  . citalopram  20 mg Oral Daily  . finasteride  5 mg Oral Daily  . insulin aspart  0-5 Units Subcutaneous QHS  . insulin aspart  0-9 Units Subcutaneous TID WC  . insulin glargine  35 Units Subcutaneous QHS  . lisinopril  40 mg Oral Daily  . potassium chloride  40 mEq Oral Once  . sodium chloride flush  3 mL Intravenous Q12H  . tamsulosin  0.4 mg Oral QPC supper  . traZODone  100 mg Oral QHS   Continuous Infusions: . sodium chloride     PRN Meds: sodium chloride, acetaminophen, ondansetron (ZOFRAN) IV, sodium chloride flush   Vital Signs    Vitals:   03/25/18 2346 03/26/18 0450 03/26/18 0547 03/26/18 1221  BP: (!) 114/47 127/68  138/76  Pulse: (!) 40 (!) 43  (!) 49  Resp: 20 14  20   Temp: 98.2 F (36.8 C) 98.2 F (36.8 C)  (!) 97.4 F (36.3 C)  TempSrc: Oral Oral  Oral  SpO2: 98% 98%  99%  Weight:   209 lb 1.6 oz (94.8 kg)   Height:   6\' 1"  (1.854 m)     Intake/Output Summary (Last 24 hours) at 03/26/2018 1308 Last data filed at 03/26/2018 1111 Gross per 24 hour  Intake 1560 ml  Output 2215 ml  Net -655 ml   Filed Weights   03/25/18 1605 03/26/18 0547  Weight: 209 lb 12.8 oz (95.2 kg) 209 lb 1.6 oz (94.8 kg)    Telemetry    2-1 AV nodal conduction.  Sinus rhythm otherwise.  Brief run of tachycardia, aberrantly conducted, possibly paroxysmal atrial tachycardia.- Personally Reviewed  ECG    Sinus bradycardia with 2-1 AV nodal conduction heart rate 44 bpm.- Personally Reviewed  Physical Exam   GEN: No acute distress.  Elderly, somewhat disheveled. Neck: No JVD Cardiac: RRR, crescendo decrescendo  murmur mid peaking, no rubs, or gallops.  Respiratory: Clear to auscultation bilaterally. GI: Soft, nontender, non-distended  MS: No edema; No deformity. Neuro:  Nonfocal  Psych: Normal affect   Labs    Chemistry Recent Labs  Lab 03/25/18 1214 03/26/18 0604  NA 143 142  K 4.1 3.4*  CL 101 103  CO2 33* 32  GLUCOSE 208* 116*  BUN 20 18  CREATININE 1.09 1.05  CALCIUM 9.3 8.8*  GFRNONAA >60 >60  GFRAA >60 >60  ANIONGAP 9 7     Hematology Recent Labs  Lab 03/25/18 1214  WBC 6.8  RBC 4.12*  HGB 12.4*  HCT 39.3  MCV 95.4  MCH 30.1  MCHC 31.6  RDW 12.9  PLT 158    Cardiac Enzymes Recent Labs  Lab 03/25/18 1541 03/25/18 2106 03/26/18 0604  TROPONINI 0.06* 0.05* 0.06*    Recent Labs  Lab 03/25/18 1229  TROPIPOC 0.23*     BNP Recent Labs  Lab 03/25/18 1214  BNP 595.5*     DDimer No results for input(s): DDIMER in the last 168 hours.   Radiology    Dg Chest 2 View  Result Date: 03/25/2018 CLINICAL DATA:  Pt presents for evaluation of sob, 5 lb weight gain overnight and palpitations. Hx of afib, on elliquis. Hx of CHF. EXAM: CHEST - 2 VIEW COMPARISON:  09/06/2017 FINDINGS: There stable changes from prior CABG surgery. The cardiac silhouette is normal in size. No mediastinal or hilar masses or evidence of adenopathy. Linear and reticular opacity is noted in the left upper lobe lingula consistent with scarring, stable. Lungs are otherwise clear. No pleural effusion or pneumothorax. There is a mild compression fracture the upper lumbar spine, stable. No acute skeletal abnormality. IMPRESSION: No acute cardiopulmonary disease. No evidence of congestive heart failure. Electronically Signed   By: Lajean Manes M.D.   On: 03/25/2018 12:47    Cardiac Studies   ECHO 08/08/17: - Left ventricle: The cavity size was normal. There was severe   concentric hypertrophy. Systolic function was normal. The   estimated ejection fraction was in the range of 55% to 60%.  Wall   motion was normal; there were no regional wall motion   abnormalities. Features are consistent with a pseudonormal left   ventricular filling pattern, with concomitant abnormal relaxation   and increased filling pressure (grade 2 diastolic dysfunction).   Doppler parameters are consistent with high ventricular filling   pressure. - Aortic valve: Right coronary cusp immobility was noted. There was   moderate to severe stenosis. There was trivial regurgitation.   Mean gradient (S): 32 mm Hg. - Aorta: Ascending aortic diameter: 41 mm (S). - Ascending aorta: The ascending aorta was mildly dilated. - Mitral valve: Severely calcified annulus. Diffuse calcification   of the anterior leaflet. There was mild regurgitation. - Left atrium: The atrium was mildly dilated. - Tricuspid valve: There was trivial regurgitation. - Pulmonary arteries: PA peak pressure: 39 mm Hg (S).  Impressions:  - The right ventricular systolic pressure was increased consistent   with mild pulmonary hypertension.  Cath 10/20/17: 1. Severe triple vessel CAD with 4/4 patent bypass grafts 2. Severe stenosis proximal LAD. The mid and distal LAD filled from the patent LIMA graft.  3 Severe stenosis proximal Circumflex involving the intermediate branch and the first OM. The vein graft to the intermediate branch is patent. The vein graft to the first OM branch is patent.  4. Chronic occlusion of the proximal RCa. The vein graft to the distal RCA is patent.  5. Aortic valve stenosis (mean gradient 21.5 mmHg, peak to peak gradient 23 mmHg, AVA 1.12 cm2).   Recommendations: I will review his data with Dr. Burt Knack and the TAVR team. Will proceed with planning for TAVR.    Patient Profile     84 y.o. male with moderate to severe aortic stenosis, prior mean gradient 32 mmHg on echocardiogram with prior TAVR evaluation, paroxysmal secondary heart block type I, diastolic heart failure with very small "weight window" here  once again with shortness of breath.  Assessment & Plan    Acute on chronic diastolic heart failure - 13 pound weight gain, normal weight approximately 200 pounds.  Worsening symptoms of dyspnea, orthopnea.  Recently had his torsemide dose decreased because of rising serum creatinine.  Too much salt at home.  IV Lasix currently being utilized.  Watching for hypotension.  Repeating echocardiogram to see if aortic stenosis is now progressed to severe.-  2.2 L out yesterday weight remains 209. - Dr. Lorenza Cambridge noted that he may need a sliding scale type of diuretic regimen as outpatient.  For instance: - torsemide 40 mg  BID if weight >205 lb -  torsemide 20 mg BID if weight 200-205 lb (on home scale - our office scale seems to read 3 lb higher)   Moderate to severe aortic stenosis - Has been evaluated by Dr. Burt Knack in TAVR clinic.  Reevaluation with echocardiogram here.  Awaiting.  Coronary artery disease status post CABG - Last heart catheterization 10/2017 as above shows patent grafts.  Medical therapy.  Atrial flutter/sinus bradycardia with second-degree Mobitz type I - Has refused anticoagulation in the past.  Trying to avoid beta-blockers.  He was taking low-dose carvedilol 3.125 mg twice a day at home.  This is now discontinued.  if TAVR is needed in the future, may very well require pacemaker given AV nodal disruption.  Overnight did have transient 2-1 AV nodal conduction, heart rate of approximately 44 bpm.  If this continues, recommend having EP consultation tomorrow.  Diabetes with hypertension -Per primary team.  For questions or updates, please contact [place] Please consult [email or web address] for contact info under Cardiology/STEMI.      Signed, [name], MD  03/26/2018, 1:08 PM

## 2018-03-26 NOTE — Progress Notes (Signed)
SATURATION QUALIFICATIONS: (This note is used to comply with regulatory documentation for home oxygen)  Patient Saturations on Room Air at Rest = 87%  Patient Saturations on Room Air while Ambulating = 85%  Patient Saturations on 2 Liters of oxygen while Ambulating = 95%  Please briefly explain why patient needs home oxygen:Needed O2 to keep sats above 90%.  May need home O2.  Thanks.  [place] [phone] (pager)

## 2018-03-26 NOTE — Progress Notes (Signed)
Patient resting comfortably during shift report. Denies complaints.  

## 2018-03-27 DIAGNOSIS — I459 Conduction disorder, unspecified: Secondary | ICD-10-CM

## 2018-03-27 LAB — GLUCOSE, CAPILLARY
GLUCOSE-CAPILLARY: 122 mg/dL — AB (ref 65–99)
Glucose-Capillary: 141 mg/dL — ABNORMAL HIGH (ref 65–99)
Glucose-Capillary: 131 mg/dL — ABNORMAL HIGH (ref 65–99)
Glucose-Capillary: 179 mg/dL — ABNORMAL HIGH (ref 65–99)

## 2018-03-27 LAB — BASIC METABOLIC PANEL
Anion gap: 8 (ref 5–15)
BUN: 15 mg/dL (ref 6–20)
CHLORIDE: 103 mmol/L (ref 101–111)
CO2: 30 mmol/L (ref 22–32)
CREATININE: 1.01 mg/dL (ref 0.61–1.24)
Calcium: 9 mg/dL (ref 8.9–10.3)
GFR calc Af Amer: >60 mL/min (ref >60)
GFR calc non Af Amer: >60 mL/min (ref >60)
Glucose, Bld: 127 mg/dL — ABNORMAL HIGH (ref 65–99)
Potassium: 3.8 mmol/L (ref 3.5–5.1)
SODIUM: 141 mmol/L (ref 135–145)

## 2018-03-27 MED ORDER — MAGNESIUM OXIDE 400 (241.3 MG) MG PO TABS
400.0000 mg | ORAL_TABLET | Freq: Once | ORAL | Status: AC
  Administered 2018-03-27: 400 mg via ORAL
  Filled 2018-03-27: qty 1

## 2018-03-27 MED ORDER — POTASSIUM CHLORIDE CRYS ER 20 MEQ PO TBCR
40.0000 meq | EXTENDED_RELEASE_TABLET | Freq: Once | ORAL | Status: AC
  Administered 2018-03-27: 40 meq via ORAL
  Filled 2018-03-27: qty 2

## 2018-03-27 NOTE — Progress Notes (Signed)
Progress Note  Patient Name: Charles [name] Date of Encounter: 03/27/2018  Primary Cardiologist: [name], MD   Subjective   Recorded positive overnight. Weight, however, is down from 209 to 201 lbs.  Inpatient Medications    Scheduled Meds: . acetaminophen  500 mg Oral Daily  . apixaban  5 mg Oral BID  . atorvastatin  20 mg Oral Daily  . citalopram  20 mg Oral Daily  . finasteride  5 mg Oral Daily  . insulin aspart  0-5 Units Subcutaneous QHS  . insulin aspart  0-9 Units Subcutaneous TID WC  . insulin glargine  35 Units Subcutaneous QHS  . lisinopril  40 mg Oral Daily  . sodium chloride flush  3 mL Intravenous Q12H  . tamsulosin  0.4 mg Oral QPC supper  . traZODone  100 mg Oral QHS   Continuous Infusions: . sodium chloride     PRN Meds: sodium chloride, acetaminophen, ondansetron (ZOFRAN) IV, sodium chloride flush   Vital Signs    Vitals:   03/26/18 0547 03/26/18 1221 03/26/18 1900 03/27/18 0608  BP:  138/76 (!) 112/50 140/67  Pulse:  (!) 49 68 (!) 53  Resp:  20 20 20   Temp:  (!) 97.4 F (36.3 C) 97.6 F (36.4 C) 98.9 F (37.2 C)  TempSrc:  Oral Oral Oral  SpO2:  99% 98% 93%  Weight: 209 lb 1.6 oz (94.8 kg)   201 lb (91.2 kg)  Height: 6\' 1"  (1.854 m)       Intake/Output Summary (Last 24 hours) at 03/27/2018 1047 Last data filed at 03/27/2018 0845 Gross per 24 hour  Intake 1122.5 ml  Output 850 ml  Net 272.5 ml   Filed Weights   03/25/18 1605 03/26/18 0547 03/27/18 0608  Weight: 209 lb 12.8 oz (95.2 kg) 209 lb 1.6 oz (94.8 kg) 201 lb (91.2 kg)    Telemetry    Sinus brady with 2:1 AVB in 40's - Personally Reviewed  ECG   N/A  Physical Exam   General appearance: alert and no distress Neck: no carotid bruit, no JVD and thyroid not enlarged, symmetric, no tenderness/mass/nodules Lungs: diminished breath sounds bilaterally Heart: S1, S2 normal and systolic murmur: late systolic 3/6, crescendo at 2nd right intercostal space Abdomen: soft,  non-tender; bowel sounds normal; no masses,  no organomegaly Extremities: edema trace edema Pulses: 2+ and symmetric Skin: Skin color, texture, turgor normal. No rashes or lesions Neurologic: Grossly normal Psych: Pleasant   Labs    Chemistry Recent Labs  Lab 03/25/18 1214 03/26/18 0604 03/27/18 0638  NA 143 142 141  K 4.1 3.4* 3.8  CL 101 103 103  CO2 33* 32 30  GLUCOSE 208* 116* 127*  BUN 20 18 15   CREATININE 1.09 1.05 1.01  CALCIUM 9.3 8.8* 9.0  GFRNONAA >60 >60 >60  GFRAA >60 >60 >60  ANIONGAP 9 7 8      Hematology Recent Labs  Lab 03/25/18 1214  WBC 6.8  RBC 4.12*  HGB 12.4*  HCT 39.3  MCV 95.4  MCH 30.1  MCHC 31.6  RDW 12.9  PLT 158    Cardiac Enzymes Recent Labs  Lab 03/25/18 1541 03/25/18 2106 03/26/18 0604  TROPONINI 0.06* 0.05* 0.06*    Recent Labs  Lab 03/25/18 1229  TROPIPOC 0.23*     BNP Recent Labs  Lab 03/25/18 1214  BNP 595.5*     DDimer No results for input(s): DDIMER in the last 168 hours.   Radiology  Dg Chest 2 View  Result Date: 03/25/2018 CLINICAL DATA:  Pt presents for evaluation of sob, 5 lb weight gain overnight and palpitations. Hx of afib, on elliquis. Hx of CHF. EXAM: CHEST - 2 VIEW COMPARISON:  09/06/2017 FINDINGS: There stable changes from prior CABG surgery. The cardiac silhouette is normal in size. No mediastinal or hilar masses or evidence of adenopathy. Linear and reticular opacity is noted in the left upper lobe lingula consistent with scarring, stable. Lungs are otherwise clear. No pleural effusion or pneumothorax. There is a mild compression fracture the upper lumbar spine, stable. No acute skeletal abnormality. IMPRESSION: No acute cardiopulmonary disease. No evidence of congestive heart failure. Electronically Signed   By: Lajean Manes M.D.   On: 03/25/2018 12:47    Cardiac Studies   ECHO 08/08/17: - Left ventricle: The cavity size was normal. There was severe   concentric hypertrophy. Systolic  function was normal. The   estimated ejection fraction was in the range of 55% to 60%. Wall   motion was normal; there were no regional wall motion   abnormalities. Features are consistent with a pseudonormal left   ventricular filling pattern, with concomitant abnormal relaxation   and increased filling pressure (grade 2 diastolic dysfunction).   Doppler parameters are consistent with high ventricular filling   pressure. - Aortic valve: Right coronary cusp immobility was noted. There was   moderate to severe stenosis. There was trivial regurgitation.   Mean gradient (S): 32 mm Hg. - Aorta: Ascending aortic diameter: 41 mm (S). - Ascending aorta: The ascending aorta was mildly dilated. - Mitral valve: Severely calcified annulus. Diffuse calcification   of the anterior leaflet. There was mild regurgitation. - Left atrium: The atrium was mildly dilated. - Tricuspid valve: There was trivial regurgitation. - Pulmonary arteries: PA peak pressure: 39 mm Hg (S).  Impressions:  - The right ventricular systolic pressure was increased consistent   with mild pulmonary hypertension.  Cath 10/20/17: 1. Severe triple vessel CAD with 4/4 patent bypass grafts 2. Severe stenosis proximal LAD. The mid and distal LAD filled from the patent LIMA graft.  3 Severe stenosis proximal Circumflex involving the intermediate branch and the first OM. The vein graft to the intermediate branch is patent. The vein graft to the first OM branch is patent.  4. Chronic occlusion of the proximal RCa. The vein graft to the distal RCA is patent.  5. Aortic valve stenosis (mean gradient 21.5 mmHg, peak to peak gradient 23 mmHg, AVA 1.12 cm2).   Recommendations: I will review his data with Dr. Burt Knack and the TAVR team. Will proceed with planning for TAVR.    Patient Profile     84 y.o. male with moderate to severe aortic stenosis, prior mean gradient 32 mmHg on echocardiogram with prior TAVR evaluation, paroxysmal  secondary heart block type I, diastolic heart failure with very small "weight window" here once again with shortness of breath.  Assessment & Plan    Acute on chronic diastolic heart failure - 13 pound weight gain, normal weight approximately 200 pounds.  Worsening symptoms of dyspnea, orthopnea.  Recently had his torsemide dose decreased because of rising serum creatinine.  Too much salt at home.  IV Lasix currently being utilized.  Watching for hypotension.  Repeating echocardiogram to see if aortic stenosis is now progressed to severe.-  2.2 L out yesterday weight remains 209. - Weight down to 201 lbs - off diuretic currently  Moderate to severe aortic stenosis - Has been evaluated  by Dr. Burt Knack in TAVR clinic. Echo now shows severe AS with a mean gradient of 40 mmHg. Will need to pursue TAVR as outpatient.  Coronary artery disease status post CABG - Last heart catheterization 10/2017 as above shows patent grafts.  Medical therapy.  Atrial flutter/sinus bradycardia with second-degree Mobitz type I - Has refused anticoagulation in the past.  Trying to avoid beta-blockers.  He was taking low-dose carvedilol 3.125 mg twice a day at home.  This is now discontinued.  if TAVR is needed in the future, may very well require pacemaker given AV nodal disruption.  Overnight did have transient 2-1 AV nodal conduction, heart rate of approximately 44 bpm.  - Continues with intermittent 2:1 AVB - likely to need PPM - ?timing. Will d/w EP today.  Diabetes with hypertension -Per primary team.  Pixie Casino, MD, [facility], [place] Director of the Advanced Lipid Disorders &  Cardiovascular Risk Reduction Clinic Diplomate of the American Board of Clinical Lipidology Attending Cardiologist  Direct Dial: [phone]  Fax: [phone]  Website:  [email or web address]    Signed, Pixie Casino, MD  03/27/2018, 10:47 AM

## 2018-03-27 NOTE — Progress Notes (Signed)
Patient Demographics:    Charles [name], is a 84 y.o. male, DOB - Nov 17, 1931, KZS:[id number]  Admit date - 03/25/2018   Admitting Physician Roney Jaffe, MD  Outpatient Primary MD for the patient is [name], MD  LOS - 2   Chief Complaint  Patient presents with  . Shortness of Breath        Subjective:    Charles [name] today has no fevers, no emesis,  No chest pain, c/o fatigue   Assessment  & Plan :    Principal Problem:   Acute on chronic diastolic CHF (congestive heart failure) (HCC) Active Problems:   Diabetes mellitus type 2, uncontrolled, with complications (HCC)   Essential hypertension   Cardiovascular disease   Aortic stenosis, moderate   Bradycardia   Coronary artery disease involving native coronary artery of native heart without angina pectoris   Elevated troponin   Heart block   Atrial flutter [facility])  Brief Summary:- 84 y.o. male with medical history significant diabetes, flutter on Eliquis, diabetes, hypertension, aortic stenosis, Mobitz 1 AV block, CAD status post CABG 2004, GERD, hyperlipidemia admitted on 03/25/2018 with weight gain, shortness of breath orthopnea, lower extremity edema consistent with CHF exacerbation in the setting of bradycardia and aortic stenosis, continues to have episodes of intermittent 2:1 AVB with heart rate down to the low 40s, query if candidate for PPM     Plan:- 1)HFpEF--- planes of fatigue, less short of breath, patient was admitted with acute on chronic diastolic dysfunction CHF in the setting of bradycardia (Mobitz type I AV block) and aortic stenosis, cardiology consult appreciated, Lasix IV for diuresis, echo with preserved EF of 60 to 65% without regional wall motion abnormalities however patient does have grade 1 diastolic dysfunction, wt is down to 201 Lb  2)Mobitz Type I Heart Block--- cardiology consult appreciated, low-dose  Coreg discontinued, continues to have episodes of intermittent 2:1 AVB with heart rate down to the low 40s, query if candidate for PPM, d/w Dr Debara Pickett... He will discuss with EP cardiology  3)Aortic Stenosis--previously documented as moderate, repeat echo with severe aortic stenosis with mean valve area of 1.59, patient may need  TAVR, cardiology consult appreciated  4)DM2- stable, last A1c was 8.7, continue Lantus 35 units nightly, Use Novolog/Humalog Sliding scale insulin with Accu-Cheks/Fingersticks as ordered   5)HTN-Coreg discontinued due to bradycardia, torsemide on hold, currently on IV Lasix, continue lisinopril 40 mg daily  6)H/o A Flutter--status post prior cardioversion x2, Home medications includes Eliquis 5mg  twice daily, continues to have episodes of intermittent 2:1 AVB with heart rate down to the low 40s, query if candidate for PPM  7)FEN--- hypokalemia and hypomagnesemia noted in the setting of diuretic use, patient denies alcohol use, replace potassium and magnesium, recheck lytes on 03/28/18  8)Disposition-upon discharge consider as per Dr. Lorenza Cambridge noted that he may need a sliding scale type of diuretic regimen as outpatient.  For instance: - torsemide 40 mg BID if weight >205 lb - torsemide 20 mg BID if weight 200-205 lb (on home scale - cardiology office scale seems to read 3 lb higher)  9)H/o CAD/CABG--- no ACS type symptoms at this time, last heart catheterization was January 2019 with patent grafts,  10)BPH with LUTs--- stable, continue Proscar and  Flomax  Code Status : DNR  Disposition Plan  : TBD  Consults  :  Cardiology  DVT Prophylaxis  : Apixaban  Lab Results  Component Value Date   PLT 158 03/25/2018    Inpatient Medications  Scheduled Meds: . acetaminophen  500 mg Oral Daily  . apixaban  5 mg Oral BID  . atorvastatin  20 mg Oral Daily  . citalopram  20 mg Oral Daily  . finasteride  5 mg Oral Daily  . insulin aspart  0-5 Units Subcutaneous QHS    . insulin aspart  0-9 Units Subcutaneous TID WC  . insulin glargine  35 Units Subcutaneous QHS  . lisinopril  40 mg Oral Daily  . magnesium oxide  400 mg Oral Once  . potassium chloride  40 mEq Oral Once  . sodium chloride flush  3 mL Intravenous Q12H  . tamsulosin  0.4 mg Oral QPC supper  . traZODone  100 mg Oral QHS   Continuous Infusions: . sodium chloride     PRN Meds:.sodium chloride, acetaminophen, ondansetron (ZOFRAN) IV, sodium chloride flush    Anti-infectives (From admission, onward)   None        Objective:   Vitals:   03/26/18 1221 03/26/18 1900 03/27/18 0608 03/27/18 1222  BP: 138/76 (!) 112/50 140/67 (!) 142/86  Pulse: (!) 49 68 (!) 53 72  Resp: 20 20 20    Temp: (!) 97.4 F (36.3 C) 97.6 F (36.4 C) 98.9 F (37.2 C) 98.3 F (36.8 C)  TempSrc: Oral Oral Oral Oral  SpO2: 99% 98% 93% 90%  Weight:   91.2 kg (201 lb)   Height:        Wt Readings from Last 3 Encounters:  03/27/18 91.2 kg (201 lb)  03/08/18 93.4 kg (206 lb)  02/15/18 94.5 kg (208 lb 4 oz)     Intake/Output Summary (Last 24 hours) at 03/27/2018 1529 Last data filed at 03/27/2018 0845 Gross per 24 hour  Intake 522.5 ml  Output 850 ml  Net -327.5 ml    Physical Exam  Gen:- Awake Alert,  In no apparent distress  HEENT:- [place].AT, No sclera icterus Neck-Supple Neck,No JVD,.  Lungs-  CTAB , good air movement CV- S1, S2 normal, heart rate 56 , 3/6 systolic murmur abd-  +ve B.Sounds, Abd Soft, No tenderness,    Extremity/Skin:-Good pulses, improving lower extremity edema  psych-affect is appropriate, oriented x3 Neuro-no new focal deficits, no tremors   Data Review:   Micro Results No results found for this or any previous visit (from the past 240 hour(s)).  Radiology Reports Dg Chest 2 View  Result Date: 03/25/2018 CLINICAL DATA:  Pt presents for evaluation of sob, 5 lb weight gain overnight and palpitations. Hx of afib, on elliquis. Hx of CHF. EXAM: CHEST - 2 VIEW COMPARISON:   09/06/2017 FINDINGS: There stable changes from prior CABG surgery. The cardiac silhouette is normal in size. No mediastinal or hilar masses or evidence of adenopathy. Linear and reticular opacity is noted in the left upper lobe lingula consistent with scarring, stable. Lungs are otherwise clear. No pleural effusion or pneumothorax. There is a mild compression fracture the upper lumbar spine, stable. No acute skeletal abnormality. IMPRESSION: No acute cardiopulmonary disease. No evidence of congestive heart failure. Electronically Signed   By: Lajean Manes M.D.   On: 03/25/2018 12:47     CBC Recent Labs  Lab 03/25/18 1214  WBC 6.8  HGB 12.4*  HCT 39.3  PLT 158  MCV  95.4  MCH 30.1  MCHC 31.6  RDW 12.9    Chemistries  Recent Labs  Lab 03/25/18 1214 03/26/18 0604 03/27/18 0638  NA 143 142 141  K 4.1 3.4* 3.8  CL 101 103 103  CO2 33* 32 30  GLUCOSE 208* 116* 127*  BUN 20 18 15   CREATININE 1.09 1.05 1.01  CALCIUM 9.3 8.8* 9.0  MG  --  1.6*  --    ------------------------------------------------------------------------------------------------------------------ No results for input(s): CHOL, HDL, LDLCALC, TRIG, CHOLHDL, LDLDIRECT in the last 72 hours.  Lab Results  Component Value Date   HGBA1C 8.7 (H) 03/25/2018   ------------------------------------------------------------------------------------------------------------------ Recent Labs    03/25/18 1603  TSH 1.604   ------------------------------------------------------------------------------------------------------------------ No results for input(s): VITAMINB12, FOLATE, FERRITIN, TIBC, IRON, RETICCTPCT in the last 72 hours.  Coagulation profile No results for input(s): INR, PROTIME in the last 168 hours.  No results for input(s): DDIMER in the last 72 hours.  Cardiac Enzymes Recent Labs  Lab 03/25/18 1541 03/25/18 2106 03/26/18 0604  TROPONINI 0.06* 0.05* 0.06*    ------------------------------------------------------------------------------------------------------------------    Component Value Date/Time   BNP 595.5 (H) 03/25/2018 1214     Maki Hege M.D on 03/27/2018 at 3:29 PM  Between 7am to 7pm - Pager - [phone]  After 7pm go to [email or web address] - password [email or web address]  [facility] -  Office  [phone]   Voice Recognition Viviann Spare dictation system was used to create this note, attempts have been made to correct errors. Please contact the author with questions and/or clarifications.

## 2018-03-27 NOTE — NC FL2 (Signed)
[place] LEVEL OF CARE SCREENING TOOL     IDENTIFICATION  Patient Name: Charles [name] Birthdate: 03-23-1932 Sex: male Admission Date (Current Location): 03/25/2018  [facility] and [place] Number:  Engineering geologist and Address:  The [place], [place] [address] [place], [place] [address]      Provider Number: [id number]  Attending Physician Name and Address:  Roxan Hockey, MD  Relative Name and Phone Number:       Current Level of Care: Hospital Recommended Level of Care: [place] Prior Approval Number:    Date Approved/Denied:   PASRR Number: [id number]  Discharge Plan: SNF    Current Diagnoses: Patient Active Problem List   Diagnosis Date Noted  . Acute on chronic diastolic heart failure ([place]) 03/25/2018  . Elevated troponin 03/25/2018  . Heart block 03/25/2018  . Atrial flutter ([place])   . Scrotal swelling 02/16/2018  . Acute cystitis without hematuria 01/17/2018  . Coronary artery disease involving native coronary artery of native heart without angina pectoris   . Acute on chronic diastolic CHF (congestive heart failure) ([place]) 09/06/2017  . Acute bacterial sinusitis 07/14/2017  . Renal insufficiency 07/01/2017  . Urinary urgency 07/01/2017  . Prostatitis 01/28/2017  . Exertional dyspnea 01/28/2017  . Gross hematuria 11/25/2016  . Chronic systolic congestive heart failure ([place]) 11/16/2016  . Chronic anticoagulation 11/05/2016  . Bradycardia 10/28/2016  . Chronic radicular pain of lower back 02/17/2016  . Typical atrial flutter ([place]) 06/11/2015  . Advanced care planning/counseling discussion 10/08/2014  . Health maintenance examination 10/08/2014  . Aortic stenosis, moderate 06/04/2014  . DDD (degenerative disc disease), lumbar 10/02/2013  . Medicare annual wellness visit, subsequent 10/02/2013  . Ex-smoker   . Carotid arterial disease ([place]) 08/23/2013  . Renal artery stenosis ([place]) 08/23/2013  . MDD  (major depressive disorder), recurrent episode, moderate ([place]) 09/21/2007  . OSTEOARTHRITIS 09/21/2007  . COLONIC POLYPS 01/31/2007  . Diabetes mellitus type 2, uncontrolled, with complications ([place]) 54/00/8676  . HYPERCHOLESTEROLEMIA 01/31/2007  . ERECTILE DYSFUNCTION 01/31/2007  . Essential hypertension 01/31/2007  . Coronary atherosclerosis 01/31/2007  . Cardiovascular disease 01/31/2007  . ALLERGIC RHINITIS 01/31/2007  . GERD 01/31/2007  . PSORIASIS 01/31/2007    Orientation RESPIRATION BLADDER Height & Weight     Self, Time, Situation, Place  O2(Nasal Canula 2 L) Continent Weight: 201 lb (91.2 kg) Height:  6\' 1"  (185.4 cm)  BEHAVIORAL SYMPTOMS/MOOD NEUROLOGICAL BOWEL NUTRITION STATUS  (None) (None) Continent Diet(Heart healthy/carb modified)  AMBULATORY STATUS COMMUNICATION OF NEEDS Skin   Limited Assist Verbally Normal                       Personal Care Assistance Level of Assistance  Bathing, Feeding, Dressing Bathing Assistance: Limited assistance Feeding assistance: Limited assistance Dressing Assistance: Limited assistance     Functional Limitations Info  Sight, Hearing, Speech Sight Info: Adequate Hearing Info: Adequate Speech Info: Adequate    SPECIAL CARE FACTORS FREQUENCY  PT (By licensed PT), Blood pressure     PT Frequency: 5 x week              Contractures Contractures Info: Not present    Additional Factors Info  Code Status, Allergies, Psychotropic Code Status Info: DNR Allergies Info: Gabapentin, Metoprolol, Spironolactone, Amlodipine, Other, Rosiglitazone Maleate, Tricor (Fenofibrate) Psychotropic Info: Depression: Celexa 20 mg PO daily, Trazodone 100 mg PO QHS.         Current Medications (03/27/2018):  This is the  current hospital active medication list Current Facility-Administered Medications  Medication Dose Route Frequency Provider Last Rate Last Dose  . 0.9 %  sodium chloride infusion  250 mL Intravenous PRN Radene Gunning, NP      . acetaminophen (TYLENOL) tablet 500 mg  500 mg Oral Daily Radene Gunning, NP   500 mg at 03/27/18 0942  . acetaminophen (TYLENOL) tablet 650 mg  650 mg Oral Q4H PRN Radene Gunning, NP      . apixaban (ELIQUIS) tablet 5 mg  5 mg Oral BID Radene Gunning, NP   5 mg at 03/27/18 0942  . atorvastatin (LIPITOR) tablet 20 mg  20 mg Oral Daily Radene Gunning, NP   20 mg at 03/27/18 8016  . citalopram (CELEXA) tablet 20 mg  20 mg Oral Daily Radene Gunning, NP   20 mg at 03/27/18 0942  . finasteride (PROSCAR) tablet 5 mg  5 mg Oral Daily Radene Gunning, NP   5 mg at 03/27/18 0942  . insulin aspart (novoLOG) injection 0-5 Units  0-5 Units Subcutaneous QHS Black, Karen M, NP      . insulin aspart (novoLOG) injection 0-9 Units  0-9 Units Subcutaneous TID WC Radene Gunning, NP   1 Units at 03/27/18 [phone]  . insulin glargine (LANTUS) injection 35 Units  35 Units Subcutaneous QHS Radene Gunning, NP   35 Units at 03/26/18 2157  . lisinopril (PRINIVIL,ZESTRIL) tablet 40 mg  40 mg Oral Daily Radene Gunning, NP   40 mg at 03/27/18 0942  . ondansetron (ZOFRAN) injection 4 mg  4 mg Intravenous Q6H PRN Radene Gunning, NP      . sodium chloride flush (NS) 0.9 % injection 3 mL  3 mL Intravenous Q12H Radene Gunning, NP   3 mL at 03/26/18 2158  . sodium chloride flush (NS) 0.9 % injection 3 mL  3 mL Intravenous PRN Radene Gunning, NP      . tamsulosin (FLOMAX) capsule 0.4 mg  0.4 mg Oral QPC supper Radene Gunning, NP   0.4 mg at 03/26/18 1840  . traZODone (DESYREL) tablet 100 mg  100 mg Oral QHS Radene Gunning, NP   100 mg at 03/26/18 2156     Discharge Medications: Please see discharge summary for a list of discharge medications.  Relevant Imaging Results:  Relevant Lab Results:   Additional Information SS#: [id number]  Candie Chroman, LCSW

## 2018-03-27 NOTE — Clinical Social Work Note (Signed)
Clinical Social Work Assessment  Patient Details  Name: Charles [name] MRN: [id number] Date of Birth: 1932-04-19  Date of referral:  03/27/18               Reason for consult:  Facility Placement, Discharge Planning                Permission sought to share information with:  Chartered certified accountant granted to share information::  Yes, Verbal Permission Granted  Name::        Agency::  SNF's  Relationship::     Contact Information:     Housing/Transportation Living arrangements for the past 2 months:  Single Family Home Source of Information:  Patient, Medical Team Patient Interpreter Needed:  None Criminal Activity/Legal Involvement Pertinent to Current Situation/Hospitalization:  No - Comment as needed Significant Relationships:  Adult Children Lives with:  Self Do you feel safe going back to the place where you live?  Yes Need for family participation in patient care:  Yes (Comment)  Care giving concerns:  PT recommending SNF once medically stable for discharge.   Social Worker assessment / plan:  CSW met with patient. No supports at bedside. CSW introduced role and explained that PT recommendations would be discussed. Patient agreeable to SNF although he does not prefer it. He stated he will do what he "has to do." No facility preference. CSW provided SNF list for review. All questions answered. No further concerns. CSW encouraged patient to contact CSW as needed. CSW will continue to follow patient for support and facilitate discharge to SNF once medically stable.  Employment status:  Retired Nurse, adult PT Recommendations:  [place] / Referral to community resources:  [place]  Patient/Family's Response to care:  Patient agreeable to SNF placement. Patient's son supportive and involved in patient's care. Patient appreciated social work intervention.  Patient/Family's Understanding  of and Emotional Response to Diagnosis, Current Treatment, and Prognosis:  Patient has a good understanding of the reason for admission and his need for rehab prior to returning home. Patient appears pleased with hospital care.  Emotional Assessment Appearance:  Appears stated age Attitude/Demeanor/Rapport:  Engaged Affect (typically observed):  Accepting, Appropriate, Calm, Pleasant Orientation:  Oriented to Self, Oriented to Place, Oriented to  Time, Oriented to Situation Alcohol / Substance use:  Tobacco Use(History) Psych involvement (Current and /or in the community):  No (Comment)  Discharge Needs  Concerns to be addressed:  Care Coordination Readmission within the last 30 days:  No Current discharge risk:  Dependent with Mobility, Lives alone Barriers to Discharge:  Continued Medical Work up, [place], LCSW 03/27/2018, 10:03 AM

## 2018-03-27 NOTE — Progress Notes (Addendum)
Physical Therapy Treatment Patient Details Name: Charles [name] MRN: [id number] DOB: Jan 03, 1932 Today's Date: 03/27/2018    History of Present Illness Charles [name] is a 84 y.o. male admitted with CHF with PMHx: AS, atrial flutter, history of Mobitz 1 AV block, history of branch retinal vein occlusion, CAD s/p CABG, left CEA, COPD, GERD, HTN, HLD    PT Comments    Pt pleasant and eager to move with desire to return home. Son present throughout session and states he checks on pt daily. Pt with improved mobility but not yet to mod I to return home. If pt continues with current progression he may be able to return home with HHPT if he can consistently demonstrate mod I function. Pt educated for HEP, controlled transfers and progression.  HR 81-111, SpO2 90-94% on RA    Follow Up Recommendations  Supervision/Assistance - 24 hour; SNF     Equipment Recommendations  None recommended by PT    Recommendations for Other Services       Precautions / Restrictions Precautions Precautions: Fall    Mobility  Bed Mobility Overal bed mobility: Modified Independent                Transfers Overall transfer level: Needs assistance   Transfers: Sit to/from Stand;Stand Pivot Transfers Sit to Stand: Min assist;Min guard Stand pivot transfers: Min guard       General transfer comment: pt on initial stand from bed required min assist to stand from surface. Repeated additional 5 trials with minguard with cues for hand placement for 1 trial. Minguard for 4 trials for stand pivot bed<>recliner with cues for decreased speed and safety  Ambulation/Gait Ambulation/Gait assistance: Min assist;+2 safety/equipment Gait Distance (Feet): 44 Feet Assistive device: Rolling walker (2 wheeled) Gait Pattern/deviations: Step-through pattern;Decreased stride length;Trunk flexed   Gait velocity interpretation: 1.31 - 2.62 ft/sec, indicative of limited community ambulator General Gait Details: pt  walks quickly with trunk flexed in fixed kyphosis with chair to follow as pt fatigues with back pain. Pt with cues to step into RW. Pt reports distance as near baseline   Marine scientist [place] (Stroke Patients Only)       Balance     Sitting balance-Leahy Scale: Good       Standing balance-Leahy Scale: Fair Standing balance comment: bil UE support with gait, able to stand briefly without UE support but with any movement requires hands on a surface                            Cognition Arousal/Alertness: Awake/alert Behavior During Therapy: Flat affect Overall Cognitive Status: Within Functional Limits for tasks assessed                                        Exercises General Exercises - Lower Extremity Long Arc Quad: AROM;Both;Seated;15 reps Hip Flexion/Marching: 15 reps;Seated;AROM;Both    General Comments        Pertinent Vitals/Pain Pain Assessment: 0-10 Pain Score: 5  Pain Location: back with gait Pain Descriptors / Indicators: Aching Pain Intervention(s): Limited activity within patient's tolerance;Monitored during session;Repositioned    Home Living                      Prior  Function            PT Goals (current goals can now be found in the care plan section) Progress towards PT goals: Goals met and updated - see care plan    Frequency           PT Plan Current plan remains appropriate    Co-evaluation              AM-PAC PT "6 Clicks" Daily Activity  Outcome Measure  Difficulty turning over in bed (including adjusting bedclothes, sheets and blankets)?: None Difficulty moving from lying on back to sitting on the side of the bed? : None Difficulty sitting down on and standing up from a chair with arms (e.g., wheelchair, bedside commode, etc,.)?: A Little Help needed moving to and from a bed to chair (including a wheelchair)?: A Little Help needed walking  in hospital room?: A Little Help needed climbing 3-5 steps with a railing? : A Lot 6 Click Score: 19    End of Session Equipment Utilized During Treatment: Gait belt Activity Tolerance: Patient tolerated treatment well Patient left: in chair;with call bell/phone within reach;with chair alarm set;with family/visitor present Nurse Communication: Mobility status PT Visit Diagnosis: Unsteadiness on feet (R26.81);Muscle weakness (generalized) (M62.81);Repeated falls (R29.6)     Time: 2415-5161 PT Time Calculation (min) (ACUTE ONLY): 25 min  Charges:  $Gait Training: 8-22 mins $Therapeutic Exercise: 8-22 mins                    G Codes:       Elwyn Reach, PT [phone]    [place] 03/27/2018, 1:23 PM

## 2018-03-27 NOTE — Clinical Social Work Placement (Signed)
   CLINICAL SOCIAL WORK PLACEMENT  NOTE  Date:  03/27/2018  Patient Details  Name: Charles [name] MRN: [id number] Date of Birth: Sep 08, 1932  Clinical Social Work is seeking post-discharge placement for this patient at the [place] level of care (*CSW will initial, date and re-position this form in  chart as items are completed):  Yes   Patient/family provided with [facility] Work Department's list of facilities offering this level of care within the geographic area requested by the patient (or if unable, by the patient's family).  Yes   Patient/family informed of their freedom to choose among providers that offer the needed level of care, that participate in Medicare, Medicaid or managed care program needed by the patient, have an available bed and are willing to accept the patient.  Yes   Patient/family informed of [facility]'s ownership interest in [facility] and [facility], as well as of the fact that they are under no obligation to receive care at these facilities.  PASRR submitted to EDS on 03/27/18     PASRR number received on       Existing PASRR number confirmed on 03/27/18     FL2 transmitted to all facilities in geographic area requested by pt/family on 03/27/18     FL2 transmitted to all facilities within larger geographic area on       Patient informed that his/her managed care company has contracts with or will negotiate with certain facilities, including the following:            Patient/family informed of bed offers received.  Patient chooses bed at       Physician recommends and patient chooses bed at      Patient to be transferred to   on  .  Patient to be transferred to facility by       Patient family notified on   of transfer.  Name of family member notified:        PHYSICIAN Please sign FL2     Additional Comment:    _______________________________________________ Candie Chroman, LCSW 03/27/2018,  10:06 AM

## 2018-03-28 LAB — BASIC METABOLIC PANEL
Anion gap: 7 (ref 5–15)
BUN: 14 mg/dL (ref 8–23)
CO2: 29 mmol/L (ref 22–32)
CREATININE: 0.89 mg/dL (ref 0.61–1.24)
Calcium: 9 mg/dL (ref 8.9–10.3)
Chloride: 105 mmol/L (ref 98–111)
GFR calc Af Amer: >60 mL/min (ref >60)
GLUCOSE: 77 mg/dL (ref 70–99)
Potassium: 4 mmol/L (ref 3.5–5.1)
SODIUM: 141 mmol/L (ref 135–145)

## 2018-03-28 LAB — GLUCOSE, CAPILLARY
Glucose-Capillary: 82 mg/dL (ref 70–99)
Glucose-Capillary: 217 mg/dL — ABNORMAL HIGH (ref 70–99)

## 2018-03-28 LAB — MAGNESIUM: Magnesium: 2.2 mg/dL (ref 1.7–2.4)

## 2018-03-28 MED ORDER — TORSEMIDE 20 MG PO TABS: 40.0000 mg | ORAL_TABLET | Freq: Two times a day (BID) | ORAL | 3 refills | Status: DC

## 2018-03-28 MED ORDER — POTASSIUM CHLORIDE ER 20 MEQ PO TBCR: 20.0000 meq | EXTENDED_RELEASE_TABLET | ORAL | 1 refills | Status: DC

## 2018-03-28 NOTE — Progress Notes (Signed)
Progress Note  Patient Name: Charles [name] Date of Encounter: 03/28/2018  Primary Cardiologist: [name], MD   Subjective   Net negative overnight. Weight is 207 lbs - suspect 201 was inaccurate - down 2 lbs over 2 days. Noted to be   Inpatient Medications    Scheduled Meds: . acetaminophen  500 mg Oral Daily  . apixaban  5 mg Oral BID  . atorvastatin  20 mg Oral Daily  . citalopram  20 mg Oral Daily  . finasteride  5 mg Oral Daily  . insulin aspart  0-5 Units Subcutaneous QHS  . insulin aspart  0-9 Units Subcutaneous TID WC  . insulin glargine  35 Units Subcutaneous QHS  . lisinopril  40 mg Oral Daily  . sodium chloride flush  3 mL Intravenous Q12H  . tamsulosin  0.4 mg Oral QPC supper  . traZODone  100 mg Oral QHS   Continuous Infusions: . sodium chloride     PRN Meds: sodium chloride, acetaminophen, ondansetron (ZOFRAN) IV, sodium chloride flush   Vital Signs    Vitals:   03/27/18 1222 03/27/18 2017 03/28/18 0437 03/28/18 0754  BP: (!) 142/86 (!) 144/55 (!) 144/81 (!) 147/98  Pulse: 72 75 (!) 47 62  Resp:  20 20 18   Temp: 98.3 F (36.8 C) 98.1 F (36.7 C) 97.9 F (36.6 C) 97.6 F (36.4 C)  TempSrc: Oral Oral Oral Oral  SpO2: 90% 94% 96% 93%  Weight:   207 lb 12.8 oz (94.3 kg)   Height:        Intake/Output Summary (Last 24 hours) at 03/28/2018 1002 Last data filed at 03/28/2018 0604 Gross per 24 hour  Intake 123 ml  Output 775 ml  Net -652 ml   Filed Weights   03/26/18 0547 03/27/18 0608 03/28/18 0437  Weight: 209 lb 1.6 oz (94.8 kg) 201 lb (91.2 kg) 207 lb 12.8 oz (94.3 kg)    Telemetry    Sinus tachy (ambulating) - Personally Reviewed  ECG   N/A  Physical Exam   General appearance: alert and no distress Neck: no carotid bruit, no JVD and thyroid not enlarged, symmetric, no tenderness/mass/nodules Lungs: diminished breath sounds bilaterally Heart: S1, S2 normal and systolic murmur: late systolic 3/6, crescendo at 2nd right  intercostal space Abdomen: soft, non-tender; bowel sounds normal; no masses,  no organomegaly Extremities: edema trace edema Pulses: 2+ and symmetric Skin: Skin color, texture, turgor normal. No rashes or lesions Neurologic: Grossly normal Psych: Pleasant   Labs    Chemistry Recent Labs  Lab 03/26/18 0604 03/27/18 0638 03/28/18 0528  NA 142 141 141  K 3.4* 3.8 4.0  CL 103 103 105  CO2 32 30 29  GLUCOSE 116* 127* 77  BUN 18 15 14   CREATININE 1.05 1.01 0.89  CALCIUM 8.8* 9.0 9.0  GFRNONAA >60 >60 >60  GFRAA >60 >60 >60  ANIONGAP 7 8 7      Hematology Recent Labs  Lab 03/25/18 1214  WBC 6.8  RBC 4.12*  HGB 12.4*  HCT 39.3  MCV 95.4  MCH 30.1  MCHC 31.6  RDW 12.9  PLT 158    Cardiac Enzymes Recent Labs  Lab 03/25/18 1541 03/25/18 2106 03/26/18 0604  TROPONINI 0.06* 0.05* 0.06*    Recent Labs  Lab 03/25/18 1229  TROPIPOC 0.23*     BNP Recent Labs  Lab 03/25/18 1214  BNP 595.5*     DDimer No results for input(s): DDIMER in the last 168 hours.  Radiology    No results found.  Cardiac Studies   ECHO 08/08/17: - Left ventricle: The cavity size was normal. There was severe   concentric hypertrophy. Systolic function was normal. The   estimated ejection fraction was in the range of 55% to 60%. Wall   motion was normal; there were no regional wall motion   abnormalities. Features are consistent with a pseudonormal left   ventricular filling pattern, with concomitant abnormal relaxation   and increased filling pressure (grade 2 diastolic dysfunction).   Doppler parameters are consistent with high ventricular filling   pressure. - Aortic valve: Right coronary cusp immobility was noted. There was   moderate to severe stenosis. There was trivial regurgitation.   Mean gradient (S): 32 mm Hg. - Aorta: Ascending aortic diameter: 41 mm (S). - Ascending aorta: The ascending aorta was mildly dilated. - Mitral valve: Severely calcified annulus. Diffuse  calcification   of the anterior leaflet. There was mild regurgitation. - Left atrium: The atrium was mildly dilated. - Tricuspid valve: There was trivial regurgitation. - Pulmonary arteries: PA peak pressure: 39 mm Hg (S).  Impressions:  - The right ventricular systolic pressure was increased consistent   with mild pulmonary hypertension.  Cath 10/20/17: 1. Severe triple vessel CAD with 4/4 patent bypass grafts 2. Severe stenosis proximal LAD. The mid and distal LAD filled from the patent LIMA graft.  3 Severe stenosis proximal Circumflex involving the intermediate branch and the first OM. The vein graft to the intermediate branch is patent. The vein graft to the first OM branch is patent.  4. Chronic occlusion of the proximal RCa. The vein graft to the distal RCA is patent.  5. Aortic valve stenosis (mean gradient 21.5 mmHg, peak to peak gradient 23 mmHg, AVA 1.12 cm2).   Recommendations: I will review his data with Dr. Burt Knack and the TAVR team. Will proceed with planning for TAVR.    Patient Profile     84 y.o. male with moderate to severe aortic stenosis, prior mean gradient 32 mmHg on echocardiogram with prior TAVR evaluation, paroxysmal secondary heart block type I, diastolic heart failure with very small "weight window" here once again with shortness of breath.  Assessment & Plan    Acute on chronic diastolic heart failure - 13 pound weight gain, normal weight approximately 200 pounds.  Worsening symptoms of dyspnea, orthopnea.  Recently had his torsemide dose decreased because of rising serum creatinine.  Too much salt at home.  IV Lasix currently being utilized.  Watching for hypotension.  Repeating echocardiogram to see if aortic stenosis is now progressed to severe.-  2.2 L out yesterday weight remains 209. - Weight down to 207 (suspect 201 lbs was spurious reading) - off diuretic currently  - Agree with plan for SS diuretic  Severe aortic stenosis - Has been evaluated  by Dr. Burt Knack in TAVR clinic. Echo now shows severe AS with a mean gradient of 40 mmHg. Will need to pursue TAVR as outpatient. - We will arrange for early follow-up in the multidisciplinary heart valve clinic  Coronary artery disease status post CABG - Last heart catheterization 10/2017 as above shows patent grafts.  Medical therapy.  Atrial flutter/sinus bradycardia with second-degree Mobitz type I - Has refused anticoagulation in the past.  Trying to avoid beta-blockers.  He was taking low-dose carvedilol 3.125 mg twice a day at home.  This is now discontinued.  if TAVR is needed in the future, may very well require pacemaker given AV nodal  disruption.  Overnight did have transient 2-1 AV nodal conduction, heart rate of approximately 44 bpm.  - Continues with intermittent 2:1 AVB - likely to need PPM - ?timing.  - Noted to be able to achieve a sinus tach with ambulation today- plan to hold off on PPM at this point since he can augment his HR.  Diabetes with hypertension -Per primary team.  Suspect he could be discharged with early follow-up in the valve clinic. Volume management will be difficult until he can have valve replacement. This will likely involve placement of a PPM as well at that time.  Pixie Casino, MD, [facility], [place] Director of the Advanced Lipid Disorders &  Cardiovascular Risk Reduction Clinic Diplomate of the American Board of Clinical Lipidology Attending Cardiologist  Direct Dial: [phone]  Fax: [phone]  Website:  [email or web address]    Signed, Pixie Casino, MD  03/28/2018, 10:02 AM

## 2018-03-28 NOTE — Discharge Summary (Signed)
Charles [name], is a 84 y.o. male  DOB [birth year]  MRN [id number].  Admission date:  03/25/2018  Admitting Physician  Roney Jaffe, MD  Discharge Date:  03/28/2018   Primary MD  Charles Bush, MD  Recommendations for primary care physician for things to follow:   1)Very low-salt diet advised 2)Weigh yourself daily, call if you gain more than 3 pounds in 1 day or more than 5 pounds in 1 week as your diuretic medications may need to be adjusted 3)Limit your Fluid  intake to no more than 60 ounces (1.8 Liters) per day 4)sliding scale type of diuretic regimen as outpatient. - torsemide 40 mg BID if weight >205 lb - Otherwise torsemide 20 mg BID if weight 200-205 lb on home weight scale -   5)Avoid ibuprofen/Advil/Aleve/Motrin/Goody Powders/Naproxen/BC powders/Meloxicam/Diclofenac/Indomethacin and other Nonsteroidal anti-inflammatory medications as these will make you more likely to bleed and can cause stomach ulcers, can also cause Kidney problems.   6)Follow-up as outpatient with cardiologist within the next week or so to discuss possible aortic valve replacement procedure   Admission Diagnosis  Mobitz type 1 second degree atrioventricular block [I44.1] Acute on chronic congestive heart failure, unspecified heart failure type ([place]) [I50.9]   Discharge Diagnosis  Mobitz type 1 second degree atrioventricular block [I44.1] Acute on chronic congestive heart failure, unspecified heart failure type ([place]) [I50.9]    Principal Problem:   Acute on chronic diastolic CHF (congestive heart failure) (HCC) Active Problems:   Diabetes mellitus type 2, uncontrolled, with complications (HCC)   Essential hypertension   Cardiovascular disease   Aortic stenosis, moderate   Bradycardia   Coronary artery disease involving native coronary artery of native heart without angina pectoris   Elevated troponin   Heart  block   Atrial flutter ([place])      Past Medical History:  Diagnosis Date  . Abdominal aortic atherosclerosis ([place])    by xray  . Aortic stenosis 06/2014   moderate-severe  . Arthritis    in lower back (06/24/2015)  . Atrial flutter ([place])    notes 06/24/2015  . AV block, Mobitz 1 09/06/2017   Charles [name] 09/06/2017  . 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 CAD (coronary artery disease) 2004   a. s/p CABG in 2004 with LIMA-LAD, SVG-RCA, SVG-OM, and SVG-RI  . Carotid stenosis 1999   s/p L CEA  . CHF (congestive heart failure) ([place]) dx'd 06/2015   a. EF 55-60% by echo in 2016. b. 10/2016: EF reduced to 30-35% by TEE in the setting of atrial fibrillation with RVR  . Colon polyp 2005   (Charles. Tiffany [name])  . Compression fracture of L1 lumbar vertebra (HCC) remote  . COPD (chronic obstructive pulmonary disease) ([place]) 03/2011   by xray  . Depression    hx  . Ex-smoker   . GERD (gastroesophageal reflux disease) 2003   h/o duodenal ulcer per EGD as well as esophagitis  . History of colon polyps 2003, 2005   adenomatous Charles [name])  .  HLD (hyperlipidemia)   . HTN (hypertension)   . Psoriasis   . Renal artery stenosis (Charles [name]) 2004   70% bilateral, followed by cards  . T2DM (type 2 diabetes mellitus) ([place]) 1995  . Urge incontinence of urine     Past Surgical History:  Procedure Laterality Date  . CARDIAC CATHETERIZATION  03/06/2003   No intervention - recommend CABG  . CARDIOVASCULAR STRESS TEST  11/2010   normal perfusion, no evidence of ischemia, EF 62% post exercise  . CARDIOVASCULAR STRESS TEST  11/28/2012   Mild diaphragmatic attenuation; cannot exclude a focal region of nontransmural inferior scar  . CARDIOVERSION N/A 06/25/2015   Procedure: CARDIOVERSION;  Surgeon: Charles Casino, MD;  Location[facility] ENDOSCOPY;  Service: Cardiovascular;  Laterality: N/A;  . CARDIOVERSION N/A 11/02/2016   Procedure: CARDIOVERSION;  Surgeon: Charles Latch, MD;  Location: [place];   Service: Cardiovascular;  Laterality: N/A;  . CAROTID ENDARTERECTOMY Left 1999   ([place])  . CATARACT EXTRACTION W/ INTRAOCULAR LENS  IMPLANT, BILATERAL Bilateral 01/2013   Charles [name]  . COLONOSCOPY  2003   colon polyp x3 - adenomatous Charles [name])  . COLONOSCOPY  10/08/2012   2 TA, diverticulosis, int hem, no rpt rec Charles [name])  . CORONARY ANGIOPLASTY    . CORONARY ARTERY BYPASS GRAFT  03/07/2003   4v CABG ([place]) with LIMA to LAD, vein graft to RCA, 1st obtuse marginal, and ramus intermedius  . ESOPHAGOGASTRODUODENOSCOPY  10/08/2012   nl esophagus, duodenitis and erosive gastropathy, path - gastropathy no Hpylori, no rpt rec  . LUMBAR EPIDURAL INJECTION  03/2017   L5/S1 (Charles [name])  . PERIPHERAL VASCULAR CATHETERIZATION N/A 02/02/2016   Procedure: Renal Angiography;  Surgeon: Charles Harp, MD;  Location: [place] CV LAB;  Service: Cardiovascular;  Laterality: N/A;  . PERIPHERAL VASCULAR CATHETERIZATION N/A 02/02/2016   Procedure: Abdominal Aortogram;  Surgeon: Charles Harp, MD;  Location: [place] CV LAB;  Service: Cardiovascular;  Laterality: N/A;  . RENAL DOPPLER  11/29/2011   Celiac&SMA-demonstrated vessel narrowing suggestive of a greater than 50% diameter reduction. Bilateral renal arteries-demonstrated vessel narrowing of 60-99% diameter reduction. Rt Kidney-mid pole lateral simple cyst noted measuring 1.29x0.76x1.11cm and exophytic cyst outside lower pole measuring 1.23x0.96x1.31. Lft Kidney-lateral mid to lower pole simple cyst measuering-1.24x9.83x1.24  . RIGHT/LEFT HEART CATH AND CORONARY/GRAFT ANGIOGRAPHY N/A 10/20/2017   Procedure: RIGHT/LEFT HEART CATH AND CORONARY/GRAFT ANGIOGRAPHY;  Surgeon: Charles Blanks, MD;  Location: [place] CV LAB;  Service: Cardiovascular;  Laterality: N/A;  . TEE WITHOUT CARDIOVERSION N/A 06/25/2015   Procedure: TRANSESOPHAGEAL ECHOCARDIOGRAM (TEE);  Surgeon: Charles Casino, MD;  Location[facility] ENDOSCOPY;  Service: Cardiovascular;  Laterality: N/A;    . TEE WITHOUT CARDIOVERSION N/A 11/02/2016   Procedure: TRANSESOPHAGEAL ECHOCARDIOGRAM (TEE);  Surgeon: Charles Latch, MD;  Location: [facility] ENDOSCOPY;  Service: Cardiovascular;  Laterality: N/A;  . TONSILLECTOMY    . UPPER GASTROINTESTINAL ENDOSCOPY  2003   reflux esophagitis, erosive gastropathy, duodenal ulcer     HPI  from the history and physical done on the day of admission:    Chief Complaint: sob/orthopnea  HPI: Charles [name] is a very pleasant very HOH 84 y.o. male with medical history significant diabetes, flutter on Eliquis, diabetes, hypertension, aortic stenosis, Mobitz 1 AV block, CAD status post CABG 2004 GERD, hyperlipidemia since emergency Department chief complaint worsening shortness of breath orthopnea lower extremity edema and a 5 pound weight gain in one day. This or evaluation reveals BNP greater than 500, increased work of breathing, 3+ bilateral pitting  edema up to abdomen. [facility] are asked to admit  Information is obtained from the patient and his son who is at the bedside. Recent reports intermittent shortness of breath with exertion over the last several days. Last night he experienced orthopnea had difficulty sleeping. He weighs every day to day he was 5 pounds heavier than he was yesterday. The history of CHF and A. Fib. Associated symptoms include palpitations intermittent nonproductive cough as well as lower extremity edema  And increased abdominal girth. He denies chest pain nausea vomiting diarrhea constipation melena bright red blood per rectum. He denies headache dizziness syncope or near-syncope. He denies dysuria hematuria frequency or urgency. He denies fever chills recent travel or sick contacts.  ED Course:  In the emergency department he's afebrile with a blood pressure high end of normal and bradycardic with heart rate between 49 and 52. Oxygen saturation level 100% on 2 L nasal cannula.    Hospital Course:    Brief Summary:- 84  y.o.malewith medical history significantdiabetes, flutter on Eliquis, diabetes, hypertension, aortic stenosis, Mobitz 1 AV block, CAD status post CABG 2004, GERD, hyperlipidemia admitted on 03/25/2018 with weight gain, shortness of breath orthopnea, lower extremity edema consistent with CHF exacerbation in the setting of bradycardia and aortic stenosis, Had episodes of intermittent 2:1 AVB with heart rate down to the low 40s, query if candidate for PPM    Plan:- 1)HFpEF---  shortness of breath and fatigue is improved significantly, ambulated with physical therapist down the hall today without chest pains dizziness or significant DOE,  patient was admitted with acute on chronic diastolic dysfunction CHF in the setting of bradycardia (Mobitz type I AV block) and aortic stenosis, cardiology consult appreciated, treated with Lasix IV for diuresis, fluid balance is negative, weight is down, echo with preserved EF of 60 to 65% without regional wall motion abnormalities,  however patient does have grade 1 diastolic dysfunction, d/c home on sliding scale type of diuretic regimen as outpatient. - torsemide 40 mg BID if weight >205 lb - Otherwise torsemide 20 mg BID if weight 200-205 lb on home weight scale -  2)Mobitz Type I Heart Block--- cardiology consult appreciated, low-dose Coreg discontinued, Had episodes of intermittent 2:1 AVB with heart rate down to the low 40s, query if candidate for PPM, d/w Charles [name]... F/u as outpatient with cardiology, during ambulation today heart rate into the 80s.  At rest heart rate dipped down into the 40s despite stopping Coreg previously  3)Aortic Stenosis--previously documented as moderate, repeat echo with severe aortic stenosis with mean valve area of 1.59, patient may need  TAVR, cardiology consult appreciated, patient will follow-up with cardiologist as outpatient for further evaluation for possible valve surgery  4)DM2- stable, last A1c was 8.7, continue Lantus  35 units nightly,    5)HTN-Coreg discontinued due to bradycardia, restart torsemide as above. , continue lisinopril 40 mg daily   6)H/o A Flutter--status post prior cardioversion x2, Home medications includes Eliquis 5mg  twice daily,  Had  episodes of intermittent 2:1 AVB with heart rate down to the low 40s, query if candidate for PPM  7)FEN--- hypokalemia and hypomagnesemia noted in the setting of diuretic use, patient denies alcohol use, replace potassium and magnesium,  repeat potassium and magnesium are normal today, discharged home on supplemental potassium  8)Disposition-upon discharge consider as per Charles. Lorenza [place] noted that he may need a sliding scale type of diuretic regimen as outpatient. For instance: - torsemide 40 mg BID if weight >205 lb - torsemide  20 mg BID if weight 200-205 lb (on home scale - cardiology office scale seems to read 3 lb higher)  9)H/o CAD/CABG--- no ACS type symptoms at this time, last heart catheterization was January 2019 with patent grafts,  10)BPH with LUTs--- stable, continue Proscar and Flomax  Code Status : DNR  Disposition Plan  : Home with [facility] serv[facility]  Consults  :  Cardiology    Discharge Condition: stable  Follow UP  Follow-up Information    Charles Mocha, MD Follow up on 03/30/2018.   Specialty:  Cardiology Why:  Please arrive 15 minutes early for your 2:40pm appointment.  Contact information: [address] [place] [address] [phone]        Charles Bush, MD. Go on 04/14/2018.   Specialty:  Family Medicine Why:  @11 :15am Contact information: [place] [address] [facility] up.   Why:  They will do your home health care at your home Contact information: [address] [place] [address] [phone]           Diet and Activity recommendation:  As advised  Discharge Instructions    Discharge  Instructions    ([place]) Call MD:  Anytime you have any of the following symptoms: 1) 3 pound weight gain in 24 hours or 5 pounds in 1 week 2) shortness of breath, with or without a dry hacking cough 3) swelling in the hands, feet or stomach 4) if you have to sleep on extra pillows at night in order to breathe.   Complete by:  As directed    Call MD for:  difficulty breathing, headache or visual disturbances   Complete by:  As directed    Call MD for:  persistant dizziness or light-headedness   Complete by:  As directed    Call MD for:  persistant nausea and vomiting   Complete by:  As directed    Call MD for:  severe uncontrolled pain   Complete by:  As directed    Call MD for:  temperature >100.4   Complete by:  As directed    Diet - low sodium heart healthy   Complete by:  As directed    Discharge instructions   Complete by:  As directed    1)Very low-salt diet advised 2)Weigh yourself daily, call if you gain more than 3 pounds in 1 day or more than 5 pounds in 1 week as your diuretic medications may need to be adjusted 3)Limit your Fluid  intake to no more than 60 ounces (1.8 Liters) per day 4)sliding scale type of diuretic regimen as outpatient. - torsemide 40 mg BID if weight >205 lb - Otherwise torsemide 20 mg BID if weight 200-205 lb on home weight scale -   5)Avoid ibuprofen/Advil/Aleve/Motrin/Goody Powders/Naproxen/BC powders/Meloxicam/Diclofenac/Indomethacin and other Nonsteroidal anti-inflammatory medications as these will make you more likely to bleed and can cause stomach ulcers, can also cause Kidney problems.   6)Follow-up as outpatient with cardiologist within the next week or so to discuss possible aortic valve replacement procedure   Increase activity slowly   Complete by:  As directed         Discharge Medications     Allergies as of 03/28/2018      Reactions   Gabapentin Other (See Comments)   Leg pain, weakness   Metoprolol Swelling    Spironolactone Other (See Comments)  Painful gynecomastia   Amlodipine Other (See Comments)   Edema   Other Other (See Comments)   Horse serum - myalgias   Rosiglitazone Maleate Other (See Comments)   REACTION: Did not help   Tricor [fenofibrate] Other (See Comments)   myalgias      Medication List    STOP taking these medications   carvedilol 3.125 MG tablet Commonly known as:  COREG   ciprofloxacin 250 MG tablet Commonly known as:  CIPRO     TAKE these medications   acetaminophen 500 MG tablet Commonly known as:  TYLENOL Take 1 tablet (500 mg total) by mouth 3 (three) times daily. What changed:  when to take this   atorvastatin 20 MG tablet Commonly known as:  LIPITOR TAKE 1 TABLET DAILY.   citalopram 20 MG tablet Commonly known as:  CELEXA Take 1 tablet (20 mg total) by mouth daily.   ELIQUIS 5 MG Tabs tablet Generic drug:  apixaban Take 1 tablet (5 mg total) by mouth 2 (two) times daily.   finasteride 5 MG tablet Commonly known as:  PROSCAR Take 1 tablet (5 mg total) by mouth daily.   LANTUS 100 UNIT/ML injection Generic drug:  insulin glargine Inject 0.35 mLs (35 Units total) into the skin at bedtime.   lisinopril 40 MG tablet Commonly known as:  PRINIVIL,ZESTRIL Take 1 tablet (40 mg total) by mouth daily.   metFORMIN 1000 MG tablet Commonly known as:  GLUCOPHAGE Take 1 tablet (1,000 mg total) by mouth 2 (two) times daily with a meal.   nitroGLYCERIN 0.4 MG SL tablet Commonly known as:  NITROSTAT Place 1 tablet (0.4 mg total) under the tongue every 5 (five) minutes as needed for chest pain.   Potassium Chloride ER 20 MEQ Tbcr Take 20 mEq by mouth every Monday, Wednesday, Friday, Saturday, and Sunday at 6 PM. Start taking on:  03/29/2018   saw palmetto 160 MG capsule Take 320 mg by mouth daily.   tamsulosin 0.4 MG Caps capsule Commonly known as:  FLOMAX Take 1 capsule (0.4 mg total) by mouth daily after supper.   torsemide 20 MG  tablet Commonly known as:  DEMADEX Take 2 tablets (40 mg total) by mouth 2 (two) times daily. sliding scale type of diuretic regimen as outpatient. - torsemide 40 mg BID if weight >205 lb,; otherwise- torsemide 20 mg BID if weight 200-205 lb on home  weight scale What changed:  additional instructions   traZODone 50 MG tablet Commonly known as:  DESYREL Take 2 tablets (100 mg total) by mouth at bedtime.            Durable Medical Equipment  (From admission, onward)        Start     Ordered   03/28/18 1459  For home use only DME 4 wheeled rolling walker with seat  (Walkers)  Once    Question:  Patient needs a walker to treat with the following condition  Answer:  Dyspnea   03/28/18 1500      Major procedures and Radiology Reports - PLEASE review detailed and final reports for all details, in brief   Dg Chest 2 View  Result Date: 03/25/2018 CLINICAL DATA:  Pt presents for evaluation of sob, 5 lb weight gain overnight and palpitations. Hx of afib, on elliquis. Hx of CHF. EXAM: CHEST - 2 VIEW COMPARISON:  09/06/2017 FINDINGS: There stable changes from prior CABG surgery. The cardiac silhouette is normal in size. No mediastinal or hilar masses or evidence  of adenopathy. Linear and reticular opacity is noted in the left upper lobe lingula consistent with scarring, stable. Lungs are otherwise clear. No pleural effusion or pneumothorax. There is a mild compression fracture the upper lumbar spine, stable. No acute skeletal abnormality. IMPRESSION: No acute cardiopulmonary disease. No evidence of congestive heart failure. Electronically Signed   By: Lajean Manes M.D.   On: 03/25/2018 12:47    Micro Results   No results found for this or any previous visit (from the past 240 hour(s)).   Today   Subjective    Charles [name] today has no new complaints, ambulated with physical therapist down the hall today without chest pains dizziness or significant DOE          Patient has been seen  and examined prior to discharge   Objective   Blood pressure 139/73, pulse 73, temperature 98.6 F (37 C), temperature source Oral, resp. rate 16, height 6\' 1"  (1.854 m), weight 94.3 kg (207 lb 12.8 oz), SpO2 95 %.   Intake/Output Summary (Last 24 hours) at 03/28/2018 1519 Last data filed at 03/28/2018 1258 Gross per 24 hour  Intake 603 ml  Output 775 ml  Net -172 ml   Exam Gen:- Awake Alert,  In no apparent distress  HEENT:- [place].AT, No sclera icterus Neck-Supple Neck,No JVD,.  Lungs-  CTAB , good air movement CV- S1, S2 normal, CABG scar, heart rate 64 , 3/6 systolic murmur abd-  +ve B.Sounds, Abd Soft, No tenderness,    Extremity/Skin:-Good pulses, resolved lower extremity edema  psych-affect is appropriate, oriented x3 Neuro-no new focal deficits, no tremors    Data Review   CBC w Diff:  Lab Results  Component Value Date   WBC 6.8 03/25/2018   HGB 12.4 (L) 03/25/2018   HGB 13.5 10/14/2017   HGB 14.7 09/10/2011   HCT 39.3 03/25/2018   HCT 41.1 10/14/2017   PLT 158 03/25/2018   PLT 189 10/14/2017   LYMPHOPCT 21.0 01/28/2017   MONOPCT 11.9 01/28/2017   EOSPCT 4.0 01/28/2017   BASOPCT 1.0 01/28/2017    CMP:  Lab Results  Component Value Date   NA 141 03/28/2018   NA 142 03/08/2018   K 4.0 03/28/2018   CL 105 03/28/2018   CO2 29 03/28/2018   BUN 14 03/28/2018   BUN 36 (H) 03/08/2018   CREATININE 0.89 03/28/2018   CREATININE 0.94 11/05/2016   PROT 6.4 (L) 09/07/2017   PROT 6.4 09/10/2011   ALBUMIN 3.5 09/07/2017   ALBUMIN 4.2 09/10/2011   BILITOT 0.7 09/07/2017   BILITOT 0.4 09/10/2011   ALKPHOS 74 09/07/2017   ALKPHOS 52 09/10/2011   AST 17 09/07/2017   AST 14 09/10/2011   ALT 16 (L) 09/07/2017  .   Total Discharge time is about 33 minutes  Roxan Hockey M.D on 03/28/2018 at 3:19 PM  [facility]   Office  [phone]  Voice Recognition Viviann Spare dictation system was used to create this note, attempts have been made to correct errors.  Please contact the author with questions and/or clarifications.

## 2018-03-28 NOTE — Clinical Social Work Note (Addendum)
CSW provided patient with list of bed offers. He will review with his son when he arrives to the hospital around 1:00 pm. CSW will follow up on choice.  Dayton Scrape, [place]  12:49 pm PT has changed recommendation to HHPT. CSW discussed with patient and he prefers this but wants to know son's thoughts about it first. CSW left voicemail for son. MD aware.  Dayton Scrape, CSW [phone]  2:04 pm Patient's son at bedside. Discussed PT recommendations. Patient's son agreeable to home health. Left message for RNCM to notify. Per MD, patient is stable for discharge today.  CSW signing off. Consult again if any other social work needs arise.  Dayton Scrape, [place]

## 2018-03-28 NOTE — Care Management Note (Signed)
Case Management Note  Patient Details  Name: Charles [name] MRN: [id number] Date of Birth: 04-16-1932  Subjective/Objective:     CHF              Action/Plan: CM talked to patient at the bedside for [facility] choices, his son and grand daughters are at the bedside; pt chose [place]; Dan with [facility] called for arrangements; Outpatient Primary MD for the patient is [name], MD; has private insurance with [employer]; pharmacy of choice is [facility]; DME - rollater ( walling walker with seat). CM will continue to follow for progression to care.   Expected Discharge Date:  03/28/18               Expected Discharge Plan:  [place]  Discharge planning Services  CM Consult  Choice offered to:  Patient, Adult Children  HH Arranged:  RN, Disease Management, PT, OT, Nurse's Aide [place] Agency:  [facility]  Status of Service:  In process, will continue to follow  Sherrilyn Rist [phone] 03/28/2018, 2:24 PM

## 2018-03-28 NOTE — Progress Notes (Signed)
Physical Therapy Treatment Patient Details Name: Charles [name] MRN: [id number] DOB: 02-18-1932 Today's Date: 03/28/2018    History of Present Illness Charles [name] is a 84 y.o. male admitted with CHF with PMHx: AS, atrial flutter, history of Mobitz 1 AV block, history of branch retinal vein occlusion, CAD s/p CABG, left CEA, COPD, GERD, HTN, HLD    PT Comments    Pt very pleasant and reports feeling better today than prior to admission. Pt with greatly improved transfers and gait but continues to need supervision and cues for safety. Discussed with pt using a urinal at night to prevent trips to bathroom in the dark and having additional family support at home. Pt would benefit from home first program to transition home and pt would have to confirm supervision for mobility for safe discharge home. Educated for need to use RW with all standing, pivots and gait for safety. Will continue to follow.  HR 75-96 SpO2 93-97% RA   Follow Up Recommendations  Home health PT;Supervision for mobility/OOB([facility] home first)     Equipment Recommendations  None recommended by PT    Recommendations for Other Services       Precautions / Restrictions Precautions Precautions: Fall Restrictions Weight Bearing Restrictions: No    Mobility  Bed Mobility Overal bed mobility: Modified Independent Bed Mobility: Supine to Sit              Transfers     Transfers: Sit to/from [employer] Transfers Sit to Stand: Modified independent (Device/Increase time) Stand pivot transfers: Supervision       General transfer comment: pt transferred from bed to recliner without RW with minguard assist due to pt reaching out for environment pt performed repeated pivot x 2 with RW with good stability with supervision for safety and cues to fully arrive at surface before sitting  Ambulation/Gait Ambulation/Gait assistance: Min guard Gait Distance (Feet): 150 Feet Assistive device: Rolling walker  (2 wheeled) Gait Pattern/deviations: Step-through pattern;Decreased stride length;Trunk flexed   Gait velocity interpretation: 1.31 - 2.62 ft/sec, indicative of limited community ambulator General Gait Details: pt with increased stability with gait with kyphotic posture and unable to extend trunk. Pt needs cues for self-regulation to return to room with fatigue. Chair not needed to follow today with increased distance performed   Stairs             Wheelchair Mobility    Modified Rankin (Stroke Patients Only)       Balance Overall balance assessment: Needs assistance Sitting-balance support: No upper extremity supported;Feet supported Sitting balance-Leahy Scale: Good       Standing balance-Leahy Scale: Fair Standing balance comment: bil UE support with gait, able to stand briefly without UE support but with any movement requires hands on a surface                            Cognition Arousal/Alertness: Awake/alert Behavior During Therapy: WFL for tasks assessed/performed Overall Cognitive Status: Impaired/Different from baseline Area of Impairment: Safety/judgement                         Safety/Judgement: Decreased awareness of safety            Exercises General Exercises - Lower Extremity Long Arc Quad: AROM;Both;Seated;15 reps Hip Flexion/Marching: 15 reps;Seated;AROM;Both    General Comments        Pertinent Vitals/Pain Pain Assessment: No/denies pain    Home  Living                      Prior Function            PT Goals (current goals can now be found in the care plan section) Progress towards PT goals: Progressing toward goals    Frequency    Min 3X/week      PT Plan Discharge plan needs to be updated    Co-evaluation              AM-PAC PT "6 Clicks" Daily Activity  Outcome Measure  Difficulty turning over in bed (including adjusting bedclothes, sheets and blankets)?: None Difficulty moving  from lying on back to sitting on the side of the bed? : None Difficulty sitting down on and standing up from a chair with arms (e.g., wheelchair, bedside commode, etc,.)?: A Little Help needed moving to and from a bed to chair (including a wheelchair)?: A Little Help needed walking in hospital room?: A Little Help needed climbing 3-5 steps with a railing? : A Lot 6 Click Score: 19    End of Session Equipment Utilized During Treatment: Gait belt Activity Tolerance: Patient tolerated treatment well Patient left: in chair;with call bell/phone within reach;with chair alarm set Nurse Communication: Mobility status PT Visit Diagnosis: Unsteadiness on feet (R26.81);Muscle weakness (generalized) (M62.81);Repeated falls (R29.6)     Time: 1134-1150 PT Time Calculation (min) (ACUTE ONLY): 16 min  Charges:  $Gait Training: 8-22 mins                    G Codes:       Elwyn Reach, [place]    [place] 03/28/2018, 12:01 PM

## 2018-03-28 NOTE — Progress Notes (Signed)
Inpatient Diabetes Program Recommendations  AACE/ADA: New Consensus Statement on Inpatient Glycemic Control (2015)  Target Ranges:  Prepandial:   less than 140 mg/dL      Peak postprandial:   less than 180 mg/dL (1-2 hours)      Critically ill patients:  140 - 180 mg/dL   Lab Results  Component Value Date   GLUCAP 82 03/28/2018   HGBA1C 8.7 (H) 03/25/2018    Review of Glycemic ControlResults for JARQUAVIOUS, FENTRESS (MRN [id number]) as of 03/28/2018 09:50  Ref. Range 03/27/2018 07:22 03/27/2018 12:20 03/27/2018 17:04 03/27/2018 21:44 03/28/2018 07:32  Glucose-Capillary Latest Ref Range: 70 - 99 mg/dL 122 (H) 141 (H) 131 (H) 179 (H) 82    Diabetes history: Type 2 DM  Outpatient Diabetes medications:  Lantus 35 units q HS, Metformin 1000 mg bid,  Current orders for Inpatient glycemic control:  Novolog sensitive tid with meals and HS, Lantus 35 units q HS  Inpatient Diabetes Program Recommendations:    Note fasting blood sugar<100 mg/dL. Consider reducing Lantus to 30 units q HS. Text page sent.  Thanks, Adah Perl, RN, BC-ADM Inpatient Diabetes Coordinator Pager [phone] (8a-5p)

## 2018-03-28 NOTE — Discharge Instructions (Signed)
1)Very low-salt diet advised 2)Weigh yourself daily, call if you gain more than 3 pounds in 1 day or more than 5 pounds in 1 week as your diuretic medications may need to be adjusted 3)Limit your Fluid  intake to no more than 60 ounces (1.8 Liters) per day 4)sliding scale type of diuretic regimen as outpatient. - torsemide 40 mg BID if weight >205 lb - Otherwise torsemide 20 mg BID if weight 200-205 lb on home weight scale -   5)Avoid ibuprofen/Advil/Aleve/Motrin/Goody Powders/Naproxen/BC powders/Meloxicam/Diclofenac/Indomethacin and other Nonsteroidal anti-inflammatory medications as these will make you more likely to bleed and can cause stomach ulcers, can also cause Kidney problems.   6)Follow-up as outpatient with cardiologist within the next week or so to discuss possible aortic valve replacement procedure

## 2018-03-29 ENCOUNTER — Telehealth: Payer: Self-pay | Admitting: *Deleted

## 2018-03-29 NOTE — Consult Note (Signed)
            [facility] CM Primary Care Navigator  03/29/2018  Charles [name] 1931/11/08 [id number]   Attemptto seepatient at the bedsideto identify possible discharge needs but he was alreadydischargedhome per staff.  Patient was discharge home with home health services.  Per chart review, patient was admittedfor chief complaint of shortness of breath, orthopnea and lower extremity edema. (Mobitz type 1 second degree atrioventricular block, acute on chronic congestive heart failure, aortic stenosis)  Primary care provider's officeis listed asprovidingtransition of care (TOC) follow-up.  Patient has discharge instruction to follow-up with primary care provider on 04/14/18 and cardiology on 03/30/18.  Primary care provider's office called ([facility]) to notify ofpatient's discharge, need for post hospital follow-up and transition of care (TOC). Notified ofhealth issues needingclosefollow-upas per discharge summary (mainly HF, DM, HTN, aortic stenosis).  Made aware to [facility] if deemed necessaryandappropriate forfurtherservices.   For additional questions please contact:  Edwena Felty A. Reyna Lorenzi, BSN, RN-BC [facility] PRIMARY CARE Navigator Cell: [phone]

## 2018-03-29 NOTE — Telephone Encounter (Signed)
Transition Care Management Follow-up Telephone Call   Date discharged? 03/28/2018   How have you been since you were released from the hospital? "much improved"   Do you understand why you were in the hospital? yes   Do you understand the discharge instructions? yes   Where were you discharged to? home   Items Reviewed:  Medications reviewed: yes  Allergies reviewed: yes  Dietary changes reviewed: yes  Referrals reviewed: yes   Functional Questionnaire:  Activities of Daily Living (ADLs):   He states they are independent in the following: independent in all areas   Any transportation issues/concerns?: no   Any patient concerns? no   Confirmed importance and date/time of follow-up visits scheduled yes  Provider Appointment booked with Dr [name], Friday 7/12  Confirmed with patient if condition begins to worsen call PCP or go to the ER.  Patient was given the office number and encouraged to call back with question or concerns.  : yes

## 2018-03-30 ENCOUNTER — Telehealth: Payer: Self-pay

## 2018-03-30 ENCOUNTER — Encounter: Payer: Self-pay | Admitting: Cardiovascular Disease

## 2018-03-30 ENCOUNTER — Encounter (INDEPENDENT_AMBULATORY_CARE_PROVIDER_SITE_OTHER): Payer: Self-pay

## 2018-03-30 ENCOUNTER — Ambulatory Visit: Payer: Medicare Other | Admitting: Cardiovascular Disease

## 2018-03-30 VITALS — BP 130/54 | HR 42 | Ht 73.0 in | Wt 210.0 lb

## 2018-03-30 DIAGNOSIS — I350 Nonrheumatic aortic (valve) stenosis: Secondary | ICD-10-CM | POA: Diagnosis not present

## 2018-03-30 DIAGNOSIS — I5033 Acute on chronic diastolic (congestive) heart failure: Secondary | ICD-10-CM

## 2018-03-30 NOTE — Telephone Encounter (Signed)
Copied from [place] [phone]. Topic: General - Other >> Mar 29, 2018  4:57 PM Valla Leaver wrote: Reason for CRM: Edwena Felty with [facility] would like a call back from [place] regarding the patients transitional care. She says to refer the patient to [facility] if needed for heart failure and diabetes. A1C was 8.7. She is needing to make sure the patient is being managed properly for multiple chronic issues.  >> Mar 30, 2018  8:51 AM Margarite Gouge H, CMA wrote: Lm on Lorraine's vm and requested a call back. Advised her that she may need to speak with Dr Bosie Clos nurse Lattie Haw

## 2018-03-30 NOTE — Patient Instructions (Addendum)
Medication Instructions:  1) If you continue to gain weight, you can increase your Torsemide to 60mg  twice daily.  If you do, please contact the office to let us know so we can update your prescription.   Labwork: None  Testing/Procedures: Theodosia Quay, RN will contact you for follow up testing.  Follow-Up: You have been referred to our Electrophysiology department.  Dr. Burt Knack would like for you to be seen first available.   Lauren will also contact you for follow up.  Any Other Special Instructions Will Be Listed Below (If Applicable).     If you need a refill on your cardiac medications before your next appointment, please call your pharmacy.

## 2018-03-30 NOTE — Progress Notes (Signed)
Cardiology Office Note Date:  03/30/2018   ID:  Charles [name], DOB 1931-12-24, MRN [id number]  PCP:  [name], MD  Cardiologist:  Dr [name]  Chief Complaint  Patient presents with  . Shortness of Breath     History of Present Illness: Charles [name] is a 84 y.o. male who presents for follow-up of aortic valve disease.  He was initially evaluated in January 2019 for severe aortic stenosis.  The patient has a history of CAD status post CABG as well as paroxysmal atrial fibrillation and second-degree AV block.  When he was evaluated I felt that his echo criteria were borderline for severe aortic stenosis, but his clinical picture was concerning with progressive shortness of breath and a recent heart failure hospitalization.  We proceeded with cardiac catheterization demonstrating patency of all of his bypass grafts, low cardiac output with an index of 1.9, mean transaortic valve gradient of 22 mmHg, and calculated aortic valve area of 1.1 cm.  Following his heart catheterization, the patient declined further work-up and wanted to continue with serial follow-up rather than proceeding with TAVR.  He was hospitalized again last week with congestive heart failure and his echocardiogram demonstrated progressive aortic stenosis with a mean gradient of 40 mmHg.  He returns today for outpatient consultation to determine further management options of his severe aortic stenosis.  The patient is here with his son today.  He is still recovering from his recent hospitalization.  He complains of shortness of breath with modest activity.  He does feel better since he was diuresed in the hospital, but is concerned that he is starting to gain a little bit of weight back.  He is currently taking torsemide 40 mg twice daily.  He has not had any chest pain and his orthopnea is improving.  Also notes that his leg swelling has improved.  He understands that his aortic stenosis has progressed and he is now  interested in proceeding with further treatment.  Past Medical History:  Diagnosis Date  . Abdominal aortic atherosclerosis ([place])    by xray  . Aortic stenosis 06/2014   moderate-severe  . Arthritis    in lower back (06/24/2015)  . Atrial flutter ([place])    notes 06/24/2015  . AV block, Mobitz 1 09/06/2017   Archie Endo 09/06/2017  . 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 CAD (coronary artery disease) 2004   a. s/p CABG in 2004 with LIMA-LAD, SVG-RCA, SVG-OM, and SVG-RI  . Carotid stenosis 1999   s/p L CEA  . CHF (congestive heart failure) ([place]) dx'd 06/2015   a. EF 55-60% by echo in 2016. b. 10/2016: EF reduced to 30-35% by TEE in the setting of atrial fibrillation with RVR  . Colon polyp 2005   (Dr. Tiffany Kocher)  . Compression fracture of L1 lumbar vertebra (HCC) remote  . COPD (chronic obstructive pulmonary disease) ([place]) 03/2011   by xray  . Depression    hx  . Ex-smoker   . GERD (gastroesophageal reflux disease) 2003   h/o duodenal ulcer per EGD as well as esophagitis  . History of colon polyps 2003, 2005   adenomatous Vira Agar)  . HLD (hyperlipidemia)   . HTN (hypertension)   . Psoriasis   . Renal artery stenosis ([place]) 2004   70% bilateral, followed by cards  . T2DM (type 2 diabetes mellitus) ([place]) 1995  . Urge incontinence of urine     Past Surgical History:  Procedure Laterality Date  .  CARDIAC CATHETERIZATION  03/06/2003   No intervention - recommend CABG  . CARDIOVASCULAR STRESS TEST  11/2010   normal perfusion, no evidence of ischemia, EF 62% post exercise  . CARDIOVASCULAR STRESS TEST  11/28/2012   Mild diaphragmatic attenuation; cannot exclude a focal region of nontransmural inferior scar  . CARDIOVERSION N/A 06/25/2015   Procedure: CARDIOVERSION;  Surgeon: Pixie Casino, MD;  Location: [facility] ENDOSCOPY;  Service: Cardiovascular;  Laterality: N/A;  . CARDIOVERSION N/A 11/02/2016   Procedure: CARDIOVERSION;  Surgeon: [name], MD;  Location:  [place];  Service: Cardiovascular;  Laterality: N/A;  . CAROTID ENDARTERECTOMY Left 1999   ([place])  . CATARACT EXTRACTION W/ INTRAOCULAR LENS  IMPLANT, BILATERAL Bilateral 01/2013   [place]  . COLONOSCOPY  2003   colon polyp x3 - adenomatous Tiffany Kocher)  . COLONOSCOPY  10/08/2012   2 TA, diverticulosis, int hem, no rpt rec Tiffany Kocher)  . CORONARY ANGIOPLASTY    . CORONARY ARTERY BYPASS GRAFT  03/07/2003   4v CABG ([place]) with LIMA to LAD, vein graft to RCA, 1st obtuse marginal, and ramus intermedius  . ESOPHAGOGASTRODUODENOSCOPY  10/08/2012   nl esophagus, duodenitis and erosive gastropathy, path - gastropathy no Hpylori, no rpt rec  . LUMBAR EPIDURAL INJECTION  03/2017   L5/S1 ([place])  . PERIPHERAL VASCULAR CATHETERIZATION N/A 02/02/2016   Procedure: Renal Angiography;  Surgeon: [name], MD;  Location: [place] CV LAB;  Service: Cardiovascular;  Laterality: N/A;  . PERIPHERAL VASCULAR CATHETERIZATION N/A 02/02/2016   Procedure: Abdominal Aortogram;  Surgeon: [name], MD;  Location: [place] CV LAB;  Service: Cardiovascular;  Laterality: N/A;  . RENAL DOPPLER  11/29/2011   Celiac&SMA-demonstrated vessel narrowing suggestive of a greater than 50% diameter reduction. Bilateral renal arteries-demonstrated vessel narrowing of 60-99% diameter reduction. Rt Kidney-mid pole lateral simple cyst noted measuring 1.29x0.76x1.11cm and exophytic cyst outside lower pole measuring 1.23x0.96x1.31. Lft Kidney-lateral mid to lower pole simple cyst measuering-1.24x9.83x1.24  . RIGHT/LEFT HEART CATH AND CORONARY/GRAFT ANGIOGRAPHY N/A 10/20/2017   Procedure: RIGHT/LEFT HEART CATH AND CORONARY/GRAFT ANGIOGRAPHY;  Surgeon: Burnell Blanks, MD;  Location: [place] CV LAB;  Service: Cardiovascular;  Laterality: N/A;  . TEE WITHOUT CARDIOVERSION N/A 06/25/2015   Procedure: TRANSESOPHAGEAL ECHOCARDIOGRAM (TEE);  Surgeon: Pixie Casino, MD;  Location[facility] END[facility]COPY;  Service: Cardiovascular;   Laterality: N/A;  . TEE WITHOUT CARDIOVERSION N/A 11/02/2016   Procedure: TRANSESOPHAGEAL ECHOCARDIOGRAM (TEE);  Surgeon: [name], MD;  Location: [facility] ENDOSCOPY;  Service: Cardiovascular;  Laterality: N/A;  . TONSILLECTOMY    . UPPER GASTROINTESTINAL ENDOSCOPY  2003   reflux esophagitis, erosive gastropathy, duodenal ulcer    Current Outpatient Medications  Medication Sig Dispense Refill  . acetaminophen (TYLENOL) 500 MG tablet Take 1 tablet (500 mg total) by mouth 3 (three) times daily. (Patient taking differently: Take 500 mg by mouth daily. )    . atorvastatin (LIPITOR) 20 MG tablet Take 20 mg by mouth daily.    . citalopram (CELEXA) 20 MG tablet Take 1 tablet (20 mg total) by mouth daily. 90 tablet 0  . ELIQUIS 5 MG TABS tablet Take 1 tablet (5 mg total) by mouth 2 (two) times daily. 60 tablet 6  . finasteride (PROSCAR) 5 MG tablet Take 1 tablet (5 mg total) by mouth daily. 30 tablet 3  . LANTUS 100 UNIT/ML injection Inject 0.35 mLs (35 Units total) into the skin at bedtime. 10 mL 3  . lisinopril (PRINIVIL,ZESTRIL) 40 MG tablet Take 1 tablet (40 mg total) by mouth  daily. 90 tablet 2  . metFORMIN (GLUCOPHAGE) 1000 MG tablet Take 1 tablet (1,000 mg total) by mouth 2 (two) times daily with a meal. 180 tablet 1  . nitroGLYCERIN (NITROSTAT) 0.4 MG SL tablet Place 1 tablet (0.4 mg total) under the tongue every 5 (five) minutes as needed for chest pain. 90 tablet 3  . Potassium Chloride ER 20 MEQ TBCR Take 20 mEq by mouth every Monday, Wednesday, Friday, Saturday, and Sunday at 6 PM. 30 tablet 1  . saw palmetto 160 MG capsule Take 320 mg by mouth daily.     . tamsulosin (FLOMAX) 0.4 MG CAPS capsule Take 1 capsule (0.4 mg total) by mouth daily after supper. 30 capsule 6  . torsemide (DEMADEX) 20 MG tablet Take 2 tablets (40 mg total) by mouth 2 (two) times daily. sliding scale type of diuretic regimen as outpatient. - torsemide 40 mg BID if weight >205 lb,; otherwise- torsemide 20 mg BID if  weight 200-205 lb on home  weight scale 130 tablet 3  . traZODone (DESYREL) 50 MG tablet Take 2 tablets (100 mg total) by mouth at bedtime. 180 tablet 0   No current facility-administered medications for this visit.     Allergies:   Gabapentin; Metoprolol; Spironolactone; Amlodipine; Other; Rosiglitazone maleate; and Tricor [fenofibrate]   Social History:  The patient  reports that he quit smoking about 4 years ago. His smoking use included cigarettes. He has a 18.00 pack-year smoking history. He has never used smokeless tobacco. He reports that he does not drink alcohol or use drugs.   Family History:  The patient's family history includes Cancer in his brother, brother, maternal grandmother, and mother; Diabetes in his sister; Heart attack in his maternal grandfather, paternal grandfather, and paternal grandmother; Heart disease in his father and sister; Hypertension in his father and sister; Lung disease in his brother; Stroke in his sister.   ROS:  Please see the history of present illness.  Otherwise, review of systems is positive for orthopnea, PND, back pain.  All other systems are reviewed and negative.   PHYSICAL EXAM: VS:  BP (!) 130/54   Pulse (!) 42   Ht 6\' 1"  (1.854 m)   Wt 210 lb (95.3 kg)   SpO2 97%   BMI 27.71 kg/m  , BMI Body mass index is 27.71 kg/m. GEN: Elderly male, in no acute distress  HEENT: normal  Neck: no JVD, no masses. BL carotid bruits Cardiac: RRR with 3/6 harsh systolic murmur at the RUSB           Respiratory:  clear to auscultation bilaterally, normal work of breathing GI: soft, nontender, nondistended, + BS MS: no deformity or atrophy  Ext: no pretibial edema, pedal pulses 2+= bilaterally Skin: warm and dry, no rash Neuro:  Strength and sensation are intact Psych: euthymic mood, full affect  EKG:  EKG is not ordered today.  Recent Labs: 09/07/2017: ALT 16 03/08/2018: NT-Pro BNP 843 03/25/2018: B Natriuretic Peptide 595.5; Hemoglobin 12.4;  Platelets 158; TSH 1.604 03/28/2018: BUN 14; Creatinine, Ser 0.89; Magnesium 2.2; Potassium 4.0; Sodium 141   Lipid Panel     Component Value Date/Time   CHOL 131 06/08/2016 1423   TRIG 95.0 06/08/2016 1423   TRIG 133 09/10/2011   HDL 39.50 06/08/2016 1423   CHOLHDL 3 06/08/2016 1423   VLDL 19.0 06/08/2016 1423   LDLCALC 73 06/08/2016 1423   LDLDIRECT 77 09/10/2011      Wt Readings from Last 3 Encounters:  03/30/18  210 lb (95.3 kg)  03/28/18 207 lb 12.8 oz (94.3 kg)  03/08/18 206 lb (93.4 kg)     Cardiac Studies Reviewed: 2D Echo 03-26-2018: Left ventricle:  The cavity size was normal. Wall thickness was increased in a pattern of severe LVH. Systolic function was normal. The estimated ejection fraction was in the range of 60% to 65%. Wall motion was normal; there were no regional wall motion abnormalities. Doppler parameters are consistent with abnormal left ventricular relaxation (grade 1 diastolic dysfunction).  ------------------------------------------------------------------- Aortic valve:   Trileaflet; severely calcified leaflets. Valve mobility was restricted. The right coronary cusp was heavily calcified and immobile.  Doppler:   There was severe stenosis. There was trivial regurgitation.    VTI ratio of LVOT to aortic valve: 0.43. Valve area (VTI): 1.48 cm^2. Indexed valve area (VTI): 0.67 cm^2/m^2. Peak velocity ratio of LVOT to aortic valve: 0.4. Valve area (Vmax): 1.38 cm^2. Indexed valve area (Vmax): 0.62 cm^2/m^2. Mean velocity ratio of LVOT to aortic valve: 0.46. Valve area (Vmean): 1.59 cm^2. Indexed valve area (Vmean): 0.72 cm^2/m^2.    Mean gradient (S): 40 mm Hg. Peak gradient (S): 76 mm Hg.  ------------------------------------------------------------------- Aorta:  Aortic root: The aortic root was mildly dilated.  ------------------------------------------------------------------- Mitral valve:   Moderately calcified annulus. Mildly  thickened leaflets .  Doppler:   The findings are consistent with mild stenosis.   There was mild regurgitation.    Valve area by pressure half-time: 4.31 cm^2. Indexed valve area by pressure half-time: 1.94 cm^2/m^2. Valve area by continuity equation (using LVOT flow): 2.48 cm^2. Indexed valve area by continuity equation (using LVOT flow): 1.11 cm^2/m^2.    Mean gradient (D): 7 mm Hg. Peak gradient (D): 13 mm Hg.  ------------------------------------------------------------------- Left atrium:  The atrium was moderately dilated.  ------------------------------------------------------------------- Right ventricle:  The cavity size was mildly dilated. Wall thickness was normal. Systolic function was mildly reduced.  ------------------------------------------------------------------- Pulmonic valve:   Poorly visualized.  Doppler:  Transvalvular velocity was within the normal range. There was no evidence for stenosis. There was no significant regurgitation.  ------------------------------------------------------------------- Tricuspid valve:   Structurally normal valve.    Doppler: Transvalvular velocity was within the normal range. There was no regurgitation.  ------------------------------------------------------------------- Pulmonary artery:   The main pulmonary artery was normal-sized. Systolic pressure was within the normal range.  ------------------------------------------------------------------- Right atrium:  The atrium was moderately dilated.  ------------------------------------------------------------------- Pericardium:  There was no pericardial effusion.  ------------------------------------------------------------------- Systemic veins: Inferior vena cava: The vessel was normal in size.  ASSESSMENT AND PLAN: This is an 84 year old male with severe symptomatic aortic stenosis (stage D).  He has had a recent hospitalization with heart failure and his echo  study demonstrates progressive and clearly severe aortic stenosis.  His mean transaortic valve gradient is now 40 mmHg.  LV function is well-preserved.  I have personally reviewed his echo images which demonstrate some mobility of the noncoronary cusp, but heavily calcified and nearly fused right and left coronary cusps.  He has New York Heart Association functional class III limitation despite good medical therapy and fairly high dose loop diuretic.  I have again reviewed indications for aortic valve replacement.  The patient is clearly not a candidate for conventional surgery at age 50 with multiple comorbidities including previous CABG, diabetes, and significant functional limitation ambulating with a walker.  TAVR might serve as a reasonable treatment alternative.  We discussed risks, indications, and alternatives at length.  He is eager to proceed with further evaluation.  He underwent right  and left heart catheterization earlier this year and this study is reviewed, demonstrating patency of his bypass grafts.  With no new anginal symptoms, I do not think he requires a repeat catheterization.  He will need CTA studies of the heart and chest/abdomen/pelvis.  The patient's most recent EKGs are reviewed.  He has periods of marked bradycardia with Mobitz 1 block and there are reports of 2: 1 AV block as well.  He states that his heart rate commonly runs in the 40s.  I am concerned about the extent of conduction disease with this patient, especially with need for TAVR.  I think there is a very high likelihood he would require permanent pacemaker placement after TAVR and it would likely be safer for him to consider pacemaker placement electively before TAVR.  Will review with the multidisciplinary heart valve team and will place a formal consultation with electrophysiology for consideration of permanent pacemaker placement.  All this is explained in detail with the patient and his son today.  Current medicines  are reviewed with the patient today.  The patient does not have concerns regarding medicines.  Labs/ tests ordered today include:  No orders of the defined types were placed in this encounter.  Disposition:     CTA Heart  CTA chest/abdomen/pelvis  EP evaluation for PPM  Cardiac surgical consultation with Dr Roxy Manns or Dr Cyndia Bent  Signed, Sherren Mocha, MD  03/30/2018 3:17 PM    [facility] [address], [place], [place]  [address] Phone: [phone]; Fax: [phone]

## 2018-03-30 NOTE — Telephone Encounter (Signed)
Noted  

## 2018-03-31 ENCOUNTER — Other Ambulatory Visit: Payer: Self-pay | Admitting: Physician Assistant

## 2018-03-31 ENCOUNTER — Encounter: Payer: Self-pay | Admitting: Physician Assistant

## 2018-03-31 DIAGNOSIS — I350 Nonrheumatic aortic (valve) stenosis: Secondary | ICD-10-CM

## 2018-03-31 NOTE — Telephone Encounter (Signed)
Returned Lorraine's call.  States she just wanted to let Dr. Darnell Level know that [facility] has many services available to help pt manage his many chronic health conditions at home.  They are specifically concerned about the heart failure and diabetes.  She says Dr. Darnell Level would just need to refer the pt to [facility] if thinks they can help the pt.

## 2018-03-31 NOTE — Telephone Encounter (Signed)
Charles [name] with [facility] calling to speak with Charles [name]. Please give Charles [name] a call at [phone]

## 2018-04-03 ENCOUNTER — Telehealth: Payer: Self-pay | Admitting: Cardiovascular Disease

## 2018-04-03 DIAGNOSIS — E785 Hyperlipidemia, unspecified: Secondary | ICD-10-CM | POA: Diagnosis not present

## 2018-04-03 DIAGNOSIS — J449 Chronic obstructive pulmonary disease, unspecified: Secondary | ICD-10-CM | POA: Diagnosis not present

## 2018-04-03 DIAGNOSIS — M545 Low back pain: Secondary | ICD-10-CM | POA: Diagnosis not present

## 2018-04-03 DIAGNOSIS — I5033 Acute on chronic diastolic (congestive) heart failure: Secondary | ICD-10-CM | POA: Diagnosis not present

## 2018-04-03 DIAGNOSIS — Z955 Presence of coronary angioplasty implant and graft: Secondary | ICD-10-CM | POA: Diagnosis not present

## 2018-04-03 DIAGNOSIS — G8929 Other chronic pain: Secondary | ICD-10-CM | POA: Diagnosis not present

## 2018-04-03 DIAGNOSIS — Z794 Long term (current) use of insulin: Secondary | ICD-10-CM | POA: Diagnosis not present

## 2018-04-03 DIAGNOSIS — I701 Atherosclerosis of renal artery: Secondary | ICD-10-CM | POA: Diagnosis not present

## 2018-04-03 DIAGNOSIS — M5116 Intervertebral disc disorders with radiculopathy, lumbar region: Secondary | ICD-10-CM | POA: Diagnosis not present

## 2018-04-03 DIAGNOSIS — I110 Hypertensive heart disease with heart failure: Secondary | ICD-10-CM | POA: Diagnosis not present

## 2018-04-03 DIAGNOSIS — L409 Psoriasis, unspecified: Secondary | ICD-10-CM | POA: Diagnosis not present

## 2018-04-03 DIAGNOSIS — I4891 Unspecified atrial fibrillation: Secondary | ICD-10-CM | POA: Diagnosis not present

## 2018-04-03 DIAGNOSIS — E1165 Type 2 diabetes mellitus with hyperglycemia: Secondary | ICD-10-CM | POA: Diagnosis not present

## 2018-04-03 DIAGNOSIS — Z791 Long term (current) use of non-steroidal anti-inflammatories (NSAID): Secondary | ICD-10-CM | POA: Diagnosis not present

## 2018-04-03 DIAGNOSIS — K219 Gastro-esophageal reflux disease without esophagitis: Secondary | ICD-10-CM | POA: Diagnosis not present

## 2018-04-03 DIAGNOSIS — I350 Nonrheumatic aortic (valve) stenosis: Secondary | ICD-10-CM | POA: Diagnosis not present

## 2018-04-03 DIAGNOSIS — I2510 Atherosclerotic heart disease of native coronary artery without angina pectoris: Secondary | ICD-10-CM | POA: Diagnosis not present

## 2018-04-03 DIAGNOSIS — I6522 Occlusion and stenosis of left carotid artery: Secondary | ICD-10-CM | POA: Diagnosis not present

## 2018-04-03 DIAGNOSIS — Z951 Presence of aortocoronary bypass graft: Secondary | ICD-10-CM | POA: Diagnosis not present

## 2018-04-03 DIAGNOSIS — Z87891 Personal history of nicotine dependence: Secondary | ICD-10-CM | POA: Diagnosis not present

## 2018-04-03 DIAGNOSIS — I441 Atrioventricular block, second degree: Secondary | ICD-10-CM | POA: Diagnosis not present

## 2018-04-03 DIAGNOSIS — I483 Typical atrial flutter: Secondary | ICD-10-CM | POA: Diagnosis not present

## 2018-04-03 NOTE — Telephone Encounter (Signed)
1) Are you calling to confirm a diagnosis or obtain personal health information (Y/N)? Personal health informarion   2) If so, what information is requested? Needs pt's last Ejection Fraction  Please route to Medical Records or your medical records site representative

## 2018-04-04 ENCOUNTER — Telehealth: Payer: Self-pay | Admitting: Family Medicine

## 2018-04-04 NOTE — Telephone Encounter (Signed)
agree

## 2018-04-04 NOTE — Telephone Encounter (Signed)
Called unable to leave VM

## 2018-04-04 NOTE — Telephone Encounter (Signed)
Copied from [place] [phone]. Topic: Quick Communication - See Telephone Encounter >> Apr 04, 2018 12:12 PM Bea Graff, NT wrote: CRM for notification. See Telephone encounter for: 04/04/18. Amy Pope with [facility] calling to get nursing orders for this pt. 2 times a week for 1 week and 1 time a week for 3 weeks. As well for a home health aide 1 time a week 1 week and 3 times a week for 4 weeks. CB#: [phone]

## 2018-04-05 ENCOUNTER — Telehealth: Payer: Self-pay | Admitting: Family Medicine

## 2018-04-05 DIAGNOSIS — I6522 Occlusion and stenosis of left carotid artery: Secondary | ICD-10-CM | POA: Diagnosis not present

## 2018-04-05 DIAGNOSIS — G8929 Other chronic pain: Secondary | ICD-10-CM | POA: Diagnosis not present

## 2018-04-05 DIAGNOSIS — I701 Atherosclerosis of renal artery: Secondary | ICD-10-CM | POA: Diagnosis not present

## 2018-04-05 DIAGNOSIS — Z791 Long term (current) use of non-steroidal anti-inflammatories (NSAID): Secondary | ICD-10-CM | POA: Diagnosis not present

## 2018-04-05 DIAGNOSIS — I441 Atrioventricular block, second degree: Secondary | ICD-10-CM | POA: Diagnosis not present

## 2018-04-05 DIAGNOSIS — Z794 Long term (current) use of insulin: Secondary | ICD-10-CM | POA: Diagnosis not present

## 2018-04-05 DIAGNOSIS — M5116 Intervertebral disc disorders with radiculopathy, lumbar region: Secondary | ICD-10-CM | POA: Diagnosis not present

## 2018-04-05 DIAGNOSIS — E1165 Type 2 diabetes mellitus with hyperglycemia: Secondary | ICD-10-CM | POA: Diagnosis not present

## 2018-04-05 DIAGNOSIS — Z87891 Personal history of nicotine dependence: Secondary | ICD-10-CM | POA: Diagnosis not present

## 2018-04-05 DIAGNOSIS — M545 Low back pain: Secondary | ICD-10-CM | POA: Diagnosis not present

## 2018-04-05 DIAGNOSIS — E785 Hyperlipidemia, unspecified: Secondary | ICD-10-CM | POA: Diagnosis not present

## 2018-04-05 DIAGNOSIS — I350 Nonrheumatic aortic (valve) stenosis: Secondary | ICD-10-CM | POA: Diagnosis not present

## 2018-04-05 DIAGNOSIS — Z955 Presence of coronary angioplasty implant and graft: Secondary | ICD-10-CM | POA: Diagnosis not present

## 2018-04-05 DIAGNOSIS — I4891 Unspecified atrial fibrillation: Secondary | ICD-10-CM | POA: Diagnosis not present

## 2018-04-05 DIAGNOSIS — K219 Gastro-esophageal reflux disease without esophagitis: Secondary | ICD-10-CM | POA: Diagnosis not present

## 2018-04-05 DIAGNOSIS — J449 Chronic obstructive pulmonary disease, unspecified: Secondary | ICD-10-CM | POA: Diagnosis not present

## 2018-04-05 DIAGNOSIS — L409 Psoriasis, unspecified: Secondary | ICD-10-CM | POA: Diagnosis not present

## 2018-04-05 DIAGNOSIS — I2510 Atherosclerotic heart disease of native coronary artery without angina pectoris: Secondary | ICD-10-CM | POA: Diagnosis not present

## 2018-04-05 DIAGNOSIS — Z951 Presence of aortocoronary bypass graft: Secondary | ICD-10-CM | POA: Diagnosis not present

## 2018-04-05 DIAGNOSIS — I110 Hypertensive heart disease with heart failure: Secondary | ICD-10-CM | POA: Diagnosis not present

## 2018-04-05 DIAGNOSIS — I5033 Acute on chronic diastolic (congestive) heart failure: Secondary | ICD-10-CM | POA: Diagnosis not present

## 2018-04-05 DIAGNOSIS — I483 Typical atrial flutter: Secondary | ICD-10-CM | POA: Diagnosis not present

## 2018-04-05 NOTE — Telephone Encounter (Signed)
Agree 

## 2018-04-05 NOTE — Telephone Encounter (Signed)
Spoke with Raquel Sarna with [facility] informing her Dr. Darnell Level gives verbal orders for PT.  Expresses her thanks for the call.

## 2018-04-05 NOTE — Telephone Encounter (Signed)
Copied from [place]. Topic: Quick Communication - See Telephone Encounter >> Apr 05, 2018 10:03 AM Ivar Drape wrote: CRM for notification. See Telephone encounter for: 04/05/18. Cherly Anderson, Physical Therapist for [facility] [phone] is requesting verbal orders for PT for 1w1, 2w1, 1w2.

## 2018-04-05 NOTE — Telephone Encounter (Signed)
Spoke with Amy with [facility] her Dr. Darnell Level agrees with nursing orders.  Amy expresses her thanks for the call.

## 2018-04-07 DIAGNOSIS — E1165 Type 2 diabetes mellitus with hyperglycemia: Secondary | ICD-10-CM | POA: Diagnosis not present

## 2018-04-07 DIAGNOSIS — I701 Atherosclerosis of renal artery: Secondary | ICD-10-CM | POA: Diagnosis not present

## 2018-04-07 DIAGNOSIS — Z791 Long term (current) use of non-steroidal anti-inflammatories (NSAID): Secondary | ICD-10-CM | POA: Diagnosis not present

## 2018-04-07 DIAGNOSIS — Z87891 Personal history of nicotine dependence: Secondary | ICD-10-CM | POA: Diagnosis not present

## 2018-04-07 DIAGNOSIS — I4891 Unspecified atrial fibrillation: Secondary | ICD-10-CM | POA: Diagnosis not present

## 2018-04-07 DIAGNOSIS — Z794 Long term (current) use of insulin: Secondary | ICD-10-CM | POA: Diagnosis not present

## 2018-04-07 DIAGNOSIS — I350 Nonrheumatic aortic (valve) stenosis: Secondary | ICD-10-CM | POA: Diagnosis not present

## 2018-04-07 DIAGNOSIS — I2510 Atherosclerotic heart disease of native coronary artery without angina pectoris: Secondary | ICD-10-CM | POA: Diagnosis not present

## 2018-04-07 DIAGNOSIS — I110 Hypertensive heart disease with heart failure: Secondary | ICD-10-CM | POA: Diagnosis not present

## 2018-04-07 DIAGNOSIS — I483 Typical atrial flutter: Secondary | ICD-10-CM | POA: Diagnosis not present

## 2018-04-07 DIAGNOSIS — G8929 Other chronic pain: Secondary | ICD-10-CM | POA: Diagnosis not present

## 2018-04-07 DIAGNOSIS — Z955 Presence of coronary angioplasty implant and graft: Secondary | ICD-10-CM | POA: Diagnosis not present

## 2018-04-07 DIAGNOSIS — E785 Hyperlipidemia, unspecified: Secondary | ICD-10-CM | POA: Diagnosis not present

## 2018-04-07 DIAGNOSIS — K219 Gastro-esophageal reflux disease without esophagitis: Secondary | ICD-10-CM | POA: Diagnosis not present

## 2018-04-07 DIAGNOSIS — J449 Chronic obstructive pulmonary disease, unspecified: Secondary | ICD-10-CM | POA: Diagnosis not present

## 2018-04-07 DIAGNOSIS — M545 Low back pain: Secondary | ICD-10-CM | POA: Diagnosis not present

## 2018-04-07 DIAGNOSIS — L409 Psoriasis, unspecified: Secondary | ICD-10-CM | POA: Diagnosis not present

## 2018-04-07 DIAGNOSIS — M5116 Intervertebral disc disorders with radiculopathy, lumbar region: Secondary | ICD-10-CM | POA: Diagnosis not present

## 2018-04-07 DIAGNOSIS — Z951 Presence of aortocoronary bypass graft: Secondary | ICD-10-CM | POA: Diagnosis not present

## 2018-04-07 DIAGNOSIS — I6522 Occlusion and stenosis of left carotid artery: Secondary | ICD-10-CM | POA: Diagnosis not present

## 2018-04-07 DIAGNOSIS — I5033 Acute on chronic diastolic (congestive) heart failure: Secondary | ICD-10-CM | POA: Diagnosis not present

## 2018-04-07 DIAGNOSIS — I441 Atrioventricular block, second degree: Secondary | ICD-10-CM | POA: Diagnosis not present

## 2018-04-10 ENCOUNTER — Other Ambulatory Visit: Payer: Self-pay | Admitting: Family Medicine

## 2018-04-10 DIAGNOSIS — Z87891 Personal history of nicotine dependence: Secondary | ICD-10-CM | POA: Diagnosis not present

## 2018-04-10 DIAGNOSIS — J449 Chronic obstructive pulmonary disease, unspecified: Secondary | ICD-10-CM | POA: Diagnosis not present

## 2018-04-10 DIAGNOSIS — Z951 Presence of aortocoronary bypass graft: Secondary | ICD-10-CM | POA: Diagnosis not present

## 2018-04-10 DIAGNOSIS — I110 Hypertensive heart disease with heart failure: Secondary | ICD-10-CM | POA: Diagnosis not present

## 2018-04-10 DIAGNOSIS — I701 Atherosclerosis of renal artery: Secondary | ICD-10-CM | POA: Diagnosis not present

## 2018-04-10 DIAGNOSIS — I350 Nonrheumatic aortic (valve) stenosis: Secondary | ICD-10-CM | POA: Diagnosis not present

## 2018-04-10 DIAGNOSIS — I483 Typical atrial flutter: Secondary | ICD-10-CM | POA: Diagnosis not present

## 2018-04-10 DIAGNOSIS — I6522 Occlusion and stenosis of left carotid artery: Secondary | ICD-10-CM | POA: Diagnosis not present

## 2018-04-10 DIAGNOSIS — K219 Gastro-esophageal reflux disease without esophagitis: Secondary | ICD-10-CM | POA: Diagnosis not present

## 2018-04-10 DIAGNOSIS — M545 Low back pain: Secondary | ICD-10-CM | POA: Diagnosis not present

## 2018-04-10 DIAGNOSIS — Z794 Long term (current) use of insulin: Secondary | ICD-10-CM | POA: Diagnosis not present

## 2018-04-10 DIAGNOSIS — E1165 Type 2 diabetes mellitus with hyperglycemia: Secondary | ICD-10-CM | POA: Diagnosis not present

## 2018-04-10 DIAGNOSIS — Z955 Presence of coronary angioplasty implant and graft: Secondary | ICD-10-CM | POA: Diagnosis not present

## 2018-04-10 DIAGNOSIS — I5033 Acute on chronic diastolic (congestive) heart failure: Secondary | ICD-10-CM | POA: Diagnosis not present

## 2018-04-10 DIAGNOSIS — I441 Atrioventricular block, second degree: Secondary | ICD-10-CM | POA: Diagnosis not present

## 2018-04-10 DIAGNOSIS — L409 Psoriasis, unspecified: Secondary | ICD-10-CM | POA: Diagnosis not present

## 2018-04-10 DIAGNOSIS — Z791 Long term (current) use of non-steroidal anti-inflammatories (NSAID): Secondary | ICD-10-CM | POA: Diagnosis not present

## 2018-04-10 DIAGNOSIS — I2510 Atherosclerotic heart disease of native coronary artery without angina pectoris: Secondary | ICD-10-CM | POA: Diagnosis not present

## 2018-04-10 DIAGNOSIS — G8929 Other chronic pain: Secondary | ICD-10-CM | POA: Diagnosis not present

## 2018-04-10 DIAGNOSIS — M5116 Intervertebral disc disorders with radiculopathy, lumbar region: Secondary | ICD-10-CM | POA: Diagnosis not present

## 2018-04-10 DIAGNOSIS — E785 Hyperlipidemia, unspecified: Secondary | ICD-10-CM | POA: Diagnosis not present

## 2018-04-10 DIAGNOSIS — I4891 Unspecified atrial fibrillation: Secondary | ICD-10-CM | POA: Diagnosis not present

## 2018-04-10 NOTE — Telephone Encounter (Signed)
Electronically refill request   Last prescribed on 01/12/2018 Stop Taking at Discharge  Last office visit on 02/15/2018. Patient has a hospital follow up on 04/14/2018

## 2018-04-11 ENCOUNTER — Encounter (INDEPENDENT_AMBULATORY_CARE_PROVIDER_SITE_OTHER): Payer: Self-pay

## 2018-04-11 ENCOUNTER — Encounter: Payer: Self-pay | Admitting: Internal Medicine

## 2018-04-11 ENCOUNTER — Ambulatory Visit: Payer: Medicare Other | Admitting: Internal Medicine

## 2018-04-11 VITALS — BP 120/58 | HR 82 | Ht 73.0 in | Wt 205.6 lb

## 2018-04-11 DIAGNOSIS — M5116 Intervertebral disc disorders with radiculopathy, lumbar region: Secondary | ICD-10-CM | POA: Diagnosis not present

## 2018-04-11 DIAGNOSIS — I350 Nonrheumatic aortic (valve) stenosis: Secondary | ICD-10-CM | POA: Diagnosis not present

## 2018-04-11 DIAGNOSIS — I4892 Unspecified atrial flutter: Secondary | ICD-10-CM | POA: Diagnosis not present

## 2018-04-11 DIAGNOSIS — M545 Low back pain: Secondary | ICD-10-CM | POA: Diagnosis not present

## 2018-04-11 DIAGNOSIS — L409 Psoriasis, unspecified: Secondary | ICD-10-CM | POA: Diagnosis not present

## 2018-04-11 DIAGNOSIS — I701 Atherosclerosis of renal artery: Secondary | ICD-10-CM | POA: Diagnosis not present

## 2018-04-11 DIAGNOSIS — E1165 Type 2 diabetes mellitus with hyperglycemia: Secondary | ICD-10-CM | POA: Diagnosis not present

## 2018-04-11 DIAGNOSIS — Z87891 Personal history of nicotine dependence: Secondary | ICD-10-CM | POA: Diagnosis not present

## 2018-04-11 DIAGNOSIS — I110 Hypertensive heart disease with heart failure: Secondary | ICD-10-CM | POA: Diagnosis not present

## 2018-04-11 DIAGNOSIS — I2510 Atherosclerotic heart disease of native coronary artery without angina pectoris: Secondary | ICD-10-CM

## 2018-04-11 DIAGNOSIS — Z791 Long term (current) use of non-steroidal anti-inflammatories (NSAID): Secondary | ICD-10-CM | POA: Diagnosis not present

## 2018-04-11 DIAGNOSIS — I483 Typical atrial flutter: Secondary | ICD-10-CM | POA: Diagnosis not present

## 2018-04-11 DIAGNOSIS — K219 Gastro-esophageal reflux disease without esophagitis: Secondary | ICD-10-CM | POA: Diagnosis not present

## 2018-04-11 DIAGNOSIS — I5033 Acute on chronic diastolic (congestive) heart failure: Secondary | ICD-10-CM

## 2018-04-11 DIAGNOSIS — Z951 Presence of aortocoronary bypass graft: Secondary | ICD-10-CM | POA: Diagnosis not present

## 2018-04-11 DIAGNOSIS — E785 Hyperlipidemia, unspecified: Secondary | ICD-10-CM | POA: Diagnosis not present

## 2018-04-11 DIAGNOSIS — Z955 Presence of coronary angioplasty implant and graft: Secondary | ICD-10-CM | POA: Diagnosis not present

## 2018-04-11 DIAGNOSIS — I4891 Unspecified atrial fibrillation: Secondary | ICD-10-CM | POA: Diagnosis not present

## 2018-04-11 DIAGNOSIS — Z794 Long term (current) use of insulin: Secondary | ICD-10-CM | POA: Diagnosis not present

## 2018-04-11 DIAGNOSIS — J449 Chronic obstructive pulmonary disease, unspecified: Secondary | ICD-10-CM | POA: Diagnosis not present

## 2018-04-11 DIAGNOSIS — I6522 Occlusion and stenosis of left carotid artery: Secondary | ICD-10-CM | POA: Diagnosis not present

## 2018-04-11 DIAGNOSIS — I441 Atrioventricular block, second degree: Secondary | ICD-10-CM | POA: Diagnosis not present

## 2018-04-11 DIAGNOSIS — G8929 Other chronic pain: Secondary | ICD-10-CM | POA: Diagnosis not present

## 2018-04-11 NOTE — Patient Instructions (Signed)
Medication Instructions:   Your physician recommends that you continue on your current medications as directed. Please refer to the Current Medication list given to you today.  Labwork:  Your physician recommends that you return for lab work on 7/15 for a BMP and CBC.   Testing/Procedures: Your physician has recommended that you have a pacemaker inserted. A pacemaker is a small device that is placed under the skin of your chest or abdomen to help control abnormal heart rhythms. This device uses electrical pulses to prompt the heart to beat at a normal rate. Pacemakers are used to treat heart rhythms that are too slow. Wire (leads) are attached to the pacemaker that goes into the chambers of you heart. This is done in the hospital and usually requires and overnight stay. Please see the instruction sheet given to you today for more information.   Follow-Up:  You have a wound check with the device clinic on July 30 at 2:00pm  You have device check with Dr Caryl Comes on October 30 at 1:45pm   Any Other Special Instructions Will Be Listed Below (If Applicable).     If you need a refill on your cardiac medications before your next appointment, please call your pharmacy.

## 2018-04-11 NOTE — Progress Notes (Signed)
ELECTROPHYSIOLOGY CONSULT NOTE  Patient ID: Charles [name], MRN: [id number], DOB/AGE: June 22, 1932 84 y.o. Admit date: (Not on file) Date of Consult: 04/11/2018  Primary Physician: [name], MD Primary Cardiologist: [facility] Charles SUSTAITA is a 84 y.o. male who is being seen today for the evaluation of AV block at the request of Dr [facility].      HPI Charles [name] is a 84 y.o. male referred because of persistent problems with second-degree AV block thought to be symptomatic related to exercise intolerance.  He is referred from the valve clinic where he is undergoing evaluation for TAVR  Has hx of recurrent atrial flutter w DCCV x 2 (2016,18)  Typical he is treated with Eliquis.  He has a history of coronary artery disease with prior bypass surgery.  As noted below, 1/19, grafts were patent.  He has moderate to severe SOB he denies chest pain.  He had no syncope or presyncope.  No recent palpitations.   DATE TEST EF   1/18 Echo  30-35%   1/19 LHC   Patent LIMA/ SVG 3/3  6/19 Echo  60-65%  LVH-severe AS Severe/ MS mild/LAE mod   ECGs have shown sinus bradycardia with and without the presence of concomitant second-degree AV block type I with heart rates in the 30s and 40s   Past Medical History:  Diagnosis Date  . Abdominal aortic atherosclerosis ([place])    by xray  . Aortic stenosis 06/2014   moderate-severe  . Arthritis    in lower back (06/24/2015)  . Atrial flutter ([place])    notes 06/24/2015  . AV block, [place] 1 09/06/2017   Archie Endo 09/06/2017  . 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 CAD (coronary artery disease) 2004   a. s/p CABG in 2004 with LIMA-LAD, SVG-RCA, SVG-OM, and SVG-RI  . Carotid stenosis 1999   s/p L CEA  . CHF (congestive heart failure) ([place]) dx'd 06/2015   a. EF 55-60% by echo in 2016. b. 10/2016: EF reduced to 30-35% by TEE in the setting of atrial fibrillation with RVR  . Colon polyp 2005   (Dr. Tiffany Kocher)  .  Compression fracture of L1 lumbar vertebra (HCC) remote  . COPD (chronic obstructive pulmonary disease) ([place]) 03/2011   by xray  . Depression    hx  . Ex-smoker   . GERD (gastroesophageal reflux disease) 2003   h/o duodenal ulcer per EGD as well as esophagitis  . History of colon polyps 2003, 2005   adenomatous Vira Agar)  . HLD (hyperlipidemia)   . HTN (hypertension)   . Psoriasis   . Renal artery stenosis ([place]) 2004   70% bilateral, followed by cards  . T2DM (type 2 diabetes mellitus) ([place]) 1995  . Urge incontinence of urine       Surgical History:  Past Surgical History:  Procedure Laterality Date  . CARDIAC CATHETERIZATION  03/06/2003   No intervention - recommend CABG  . CARDIOVASCULAR STRESS TEST  11/2010   normal perfusion, no evidence of ischemia, EF 62% post exercise  . CARDIOVASCULAR STRESS TEST  11/28/2012   Mild diaphragmatic attenuation; cannot exclude a focal region of nontransmural inferior scar  . CARDIOVERSION N/A 06/25/2015   Procedure: CARDIOVERSION;  Surgeon: Pixie Casino, MD;  Location: [facility]COPY;  Service: Cardiovascular;  Laterality: N/A;  . CARDIOVERSION N/A 11/02/2016   Procedure: CARDIOVERSION;  Surgeon: [name], MD;  Location: [place];  Service: Cardiovascular;  Laterality: N/A;  . CAROTID ENDARTERECTOMY Left 1999   ([place])  . CATARACT EXTRACTION W/ INTRAOCULAR LENS  IMPLANT, BILATERAL Bilateral 01/2013   [place]  . COLONOSCOPY  2003   colon polyp x3 - adenomatous Tiffany Kocher)  . COLONOSCOPY  10/08/2012   2 TA, diverticulosis, int hem, no rpt rec Tiffany Kocher)  . CORONARY ANGIOPLASTY    . CORONARY ARTERY BYPASS GRAFT  03/07/2003   4v CABG ([place]) with LIMA to LAD, vein graft to RCA, 1st obtuse marginal, and ramus intermedius  . ESOPHAGOGASTRODUODENOSCOPY  10/08/2012   nl esophagus, duodenitis and erosive gastropathy, path - gastropathy no Hpylori, no rpt rec  . LUMBAR EPIDURAL INJECTION  03/2017   L5/S1 ([place])  . PERIPHERAL VASCULAR  CATHETERIZATION N/A 02/02/2016   Procedure: Renal Angiography;  Surgeon: [name], MD;  Location: [place] [facility];  Service: Cardiovascular;  Laterality: N/A;  . PERIPHERAL VASCULAR CATHETERIZATION N/A 02/02/2016   Procedure: Abdominal Aortogram;  Surgeon: [name], MD;  Location: [place] [facility];  Service: Cardiovascular;  Laterality: N/A;  . RENAL DOPPLER  11/29/2011   Celiac&SMA-demonstrated vessel narrowing suggestive of a greater than 50% diameter reduction. Bilateral renal arteries-demonstrated vessel narrowing of 60-99% diameter reduction. Rt Kidney-mid pole lateral simple cyst noted measuring 1.29x0.76x1.11cm and exophytic cyst outside lower pole measuring 1.23x0.96x1.31. Lft Kidney-lateral mid to lower pole simple cyst measuering-1.24x9.83x1.24  . RIGHT/LEFT HEART CATH AND CORONARY/GRAFT ANGIOGRAPHY N/A 10/20/2017   Procedure: RIGHT/LEFT HEART CATH AND CORONARY/GRAFT ANGIOGRAPHY;  Surgeon: Burnell Blanks, MD;  Location: [place] [facility];  Service: Cardiovascular;  Laterality: N/A;  . TEE WITHOUT CARDIOVERSION N/A 06/25/2015   Procedure: TRANSESOPHAGEAL ECHOCARDIOGRAM (TEE);  Surgeon: Pixie Casino, MD;  Location: [facility] ENDOSCOPY;  Service: Cardiovascular;  Laterality: N/A;  . TEE WITHOUT CARDIOVERSION N/A 11/02/2016   Procedure: TRANSESOPHAGEAL ECHOCARDIOGRAM (TEE);  Surgeon: [name], MD;  Location: [facility] ENDOSCOPY;  Service: Cardiovascular;  Laterality: N/A;  . TONSILLECTOMY    . UPPER GASTROINTESTINAL ENDOSCOPY  2003   reflux esophagitis, erosive gastropathy, duodenal ulcer     Home Meds: Prior to Admission medications   Medication Sig Start Date End Date Taking? Authorizing Provider  acetaminophen (TYLENOL) 500 MG tablet Take 1 tablet (500 mg total) by mouth 3 (three) times daily. Patient taking differently: Take 500 mg by mouth daily.  02/17/16   [name], MD  atorvastatin (LIPITOR) 20 MG tablet Take 20 mg by mouth daily.    [provider]  citalopram (CELEXA) 20 MG tablet Take 1 tablet (20 mg total) by mouth daily. 11/08/17   [name], MD  ELIQUIS 5 MG TABS tablet Take 1 tablet (5 mg total) by mouth 2 (two) times daily. 12/13/17   [name], MD  finasteride (PROSCAR) 5 MG tablet Take 1 tablet (5 mg total) by mouth daily. 02/15/18   [name], MD  LANTUS 100 UNIT/ML injection Inject 0.35 mLs (35 Units total) into the skin at bedtime. 02/24/18   [name], MD  lisinopril (PRINIVIL,ZESTRIL) 40 MG tablet Take 1 tablet (40 mg total) by mouth daily. 01/02/18   [name], MD  metFORMIN (GLUCOPHAGE) 1000 MG tablet Take 1 tablet (1,000 mg total) by mouth 2 (two) times daily with a meal. 03/06/18   [name], MD  nitroGLYCERIN (NITROSTAT) 0.4 MG SL tablet Place 1 tablet (0.4 mg total) under the tongue every 5 (five) minutes as needed for chest pain. 03/08/18 06/06/18  Almyra Deforest, PA  Potassium Chloride ER 20 MEQ TBCR Take 20 mEq  by mouth every Monday, Wednesday, Friday, Saturday, and Sunday at 6 PM. 03/29/18   Roxan Hockey, MD  saw palmetto 160 MG capsule Take 320 mg by mouth daily.     [provider]  tamsulosin (FLOMAX) 0.4 MG CAPS capsule Take 1 capsule (0.4 mg total) by mouth daily after supper. 03/13/18   [name], MD  torsemide (DEMADEX) 20 MG tablet Take 2 tablets (40 mg total) by mouth 2 (two) times daily. sliding scale type of diuretic regimen as outpatient. - torsemide 40 mg BID if weight >205 lb,; otherwise- torsemide 20 mg BID if weight 200-205 lb on home  weight scale 03/28/18   Roxan Hockey, MD  traZODone (DESYREL) 50 MG tablet Take 2 tablets (100 mg total) by mouth at bedtime. 01/23/18   [name], MD    Allergies:  Allergies  Allergen Reactions  . Gabapentin Other (See Comments)    Leg pain, weakness   . Metoprolol Swelling  . Spironolactone Other (See Comments)    Painful gynecomastia  . Amlodipine Other (See Comments)    Edema  . Other  Other (See Comments)    Horse serum - myalgias  . Rosiglitazone Maleate Other (See Comments)    REACTION: Did not help  . Tricor [Fenofibrate] Other (See Comments)    myalgias    Social History   Socioeconomic History  . Marital status: Widowed    Spouse name: Not on file  . Number of children: Not on file  . Years of education: Not on file  . Highest education level: Not on file  Occupational History  . Not on file  Social Needs  . Financial resource strain: Not on file  . Food insecurity:    Worry: Not on file    Inability: Not on file  . Transportation needs:    Medical: Not on file    Non-medical: Not on file  Tobacco Use  . Smoking status: Former Smoker    Packs/day: 0.50    Years: 36.00    Pack years: 18.00    Types: Cigarettes    Last attempt to quit: 07/23/2013    Years since quitting: 4.7  . Smokeless tobacco: Never Used  Substance and Sexual Activity  . Alcohol use: No  . Drug use: No  . Sexual activity: Not Currently  Lifestyle  . Physical activity:    Days per week: Not on file    Minutes per session: Not on file  . Stress: Not on file  Relationships  . Social connections:    Talks on phone: Not on file    Gets together: Not on file    Attends religious service: Not on file    Active member of club or organization: Not on file    Attends meetings of clubs or organizations: Not on file    Relationship status: Not on file  . Intimate partner violence:    Fear of current or ex partner: Not on file    Emotionally abused: Not on file    Physically abused: Not on file    Forced sexual activity: Not on file  Other Topics Concern  . Not on file  Social History Narrative   Caffeine: 4 cups coffee/day, some tea and soda   Lives with wife   Occupation: Retired   Activity: no regular exercise   Diet: good water, fruits/vegetables daily     Family History  Problem Relation Age of Onset  . Hypertension Father   . Heart disease Father   .  Cancer  Mother        GI cancer  . Diabetes Sister   . Heart disease Sister   . Stroke Sister   . Hypertension Sister   . Cancer Brother        Lung cancer - nonsmoker  . Cancer Maternal Grandmother   . Heart attack Maternal Grandfather   . Heart attack Paternal Grandmother   . Heart attack Paternal Grandfather   . Cancer Brother        Pancreatic cancer  . Lung disease Brother      ROS:  Please see the history of present illness.     All other systems reviewed and negative.    Physical Exam:  Blood pressure (!) 120/58, pulse 82, height 6\' 1"  (1.854 m), weight 205 lb 9.6 oz (93.3 kg). General: Well developed, well nourished male in no acute distress. Head: Normocephalic, atraumatic, sclera non-icteric, no xanthomas, nares are without discharge. EENT: normal  Lymph Nodes:  none Neck: Negative for carotid bruits.  Carotids parvus and tardus JVD 8 cm Back:without scoliosis kyphosis Lungs: Clear bilaterally to auscultation without wheezes, rales, or rhonchi. Breathing is unlabored. Heart: RRR with S1 single S2.  3/6 systolic  murmur . No rubs, or gallops appreciated. Abdomen: Soft, non-tender, non-distended with normoactive bowel sounds. No hepatomegaly. No rebound/guarding. No obvious abdominal masses. Msk:  Strength and tone appear normal for age. Extremities: No clubbing or cyanosis. Tr  edema.  Distal pedal pulses are 2+ and equal bilaterally. Skin: Warm and Dry Neuro: Alert and oriented X 3. CN III-XII intact Grossly normal sensory and motor function . Psych:  Responds to questions appropriately with a normal affect.      Labs: Cardiac Enzymes No results for input(s): CKTOTAL, CKMB, TROPONINI in the last 72 hours. CBC Lab Results  Component Value Date   WBC 6.8 03/25/2018   HGB 12.4 (L) 03/25/2018   HCT 39.3 03/25/2018   MCV 95.4 03/25/2018   PLT 158 03/25/2018   PROTIME: No results for input(s): LABPROT, INR in the last 72 hours. Chemistry No results for input(s): NA,  K, CL, CO2, BUN, CREATININE, CALCIUM, PROT, BILITOT, ALKPHOS, ALT, AST, GLUCOSE in the last 168 hours.  Invalid input(s): LABALBU Lipids Lab Results  Component Value Date   CHOL 131 06/08/2016   HDL 39.50 06/08/2016   LDLCALC 73 06/08/2016   TRIG 95.0 06/08/2016   BNP Pro B Natriuretic peptide (BNP)  Date/Time Value Ref Range Status  01/28/2017 12:31 PM 537.0 (H) 0.0 - 100.0 pg/mL Final  10/28/2016 11:59 AM 812.0 (H) 0.0 - 100.0 pg/mL Final  06/06/2015 11:08 AM 374.0 (H) 0.0 - 100.0 pg/mL Final  05/30/2015 01:00 PM 536.0 (H) 0.0 - 100.0 pg/mL Final   NT-Pro BNP  Date/Time Value Ref Range Status  03/08/2018 03:22 PM 843 (H) 0 - 486 pg/mL Final    Comment:    The following cut-points have been suggested for the use of proBNP for the diagnostic evaluation of heart failure (HF) in patients with acute dyspnea: Modality                     Age           Optimal Cut                            (years)            Point ------------------------------------------------------ Diagnosis (rule in HF)        <  50            450 pg/mL                           50 - 75            900 pg/mL                               >75           1800 pg/mL Exclusion (rule out HF)  Age independent     300 pg/mL    Thyroid Function Tests: No results for input(s): TSH, T4TOTAL, T3FREE, THYROIDAB in the last 72 hours.  Invalid input(s): FREET3 Miscellaneous No results found for: DDIMER  Radiology/Studies:  Dg Chest 2 View  Result Date: 03/25/2018 CLINICAL DATA:  Pt presents for evaluation of sob, 5 lb weight gain overnight and palpitations. Hx of afib, on elliquis. Hx of CHF. EXAM: CHEST - 2 VIEW COMPARISON:  09/06/2017 FINDINGS: There stable changes from prior CABG surgery. The cardiac silhouette is normal in size. No mediastinal or hilar masses or evidence of adenopathy. Linear and reticular opacity is noted in the left upper lobe lingula consistent with scarring, stable. Lungs are otherwise clear. No  pleural effusion or pneumothorax. There is a mild compression fracture the upper lumbar spine, stable. No acute skeletal abnormality. IMPRESSION: No acute cardiopulmonary disease. No evidence of congestive heart failure. Electronically Signed   By: Lajean Manes M.D.   On: 03/25/2018 12:47    EKG: *As above  Data today sinus rhythm at 85 Intervals 32/8/36   Assessment and Plan:  Second-degree AV block type I-symptomatic  Aortic stenosis-severe  Atrial flutter-recurrent-typical   Coronary artery disease with prior bypass surgery normal LV function  Congestive heart failure-chronic-diastolic-class III    The patient has persistent symptoms with a combination of aortic stenosis and second-degree heart block.  ECG today demonstrated one-to-one conduction likely related to the sympathetic tone associated with a difficult walk back to the office.  While auscultating his heart rates were in the 40s which is consistent with his prior tracings.  I suspect that vigorous walking is less limited by his bradycardia than modest walking given the likely relationship to sympathetic tone  Dr. Burt Knack has been following for some time and thinks that his bradycardia is limiting.  I would tend to agree although I suspect there is a much more significant component related to his aortic stenosis.  Would be inclined to recommend pacing and then wait for 3-4 weeks to make sure there is no infection prior to proceeding with TAVR  The benefits and risks were reviewed including but not limited to death,  perforation, infection, lead dislodgement and device malfunction.  The patient understands agrees and is willing to proceed.  Also think it is appropriate that he undergo ablation for his atrial flutter.  The issue for this is going to be timing.  As it is elective, while my initial thought was to do it at the same time as his pacemaker, I think probably the anesthesia and the duration makes it better delayed  until after his TAVR.     [name]

## 2018-04-11 NOTE — H&P (View-Only) (Signed)
ELECTROPHYSIOLOGY CONSULT NOTE  Patient ID: [name] Charles, MRN: [id number], DOB/AGE: [birth year] 84 y.o. Admit date: (Not on file) Date of Consult: 04/11/2018  Primary Physician: Charles Bush, MD Primary Cardiologist: [facility] Charles [name] is a 84 y.o. male who is being seen today for the evaluation of AV block at the request of Dr [facility].      HPI Charles [name] is a 84 y.o. male referred because of persistent problems with second-degree AV block thought to be symptomatic related to exercise intolerance.  He is referred from the valve clinic where he is undergoing evaluation for TAVR  Has hx of recurrent atrial flutter w DCCV x 2 (2016,18)  Typical he is treated with Eliquis.  He has a history of coronary artery disease with prior bypass surgery.  As noted below, 1/19, grafts were patent.  He has moderate to severe SOB he denies chest pain.  He had no syncope or presyncope.  No recent palpitations.   DATE TEST EF   1/18 Echo  30-35%   1/19 LHC   Patent LIMA/ SVG 3/3  6/19 Echo  60-65%  LVH-severe AS Severe/ MS mild/LAE mod   ECGs have shown sinus bradycardia with and without the presence of concomitant second-degree AV block type I with heart rates in the 30s and 40s   Past Medical History:  Diagnosis Date  . Abdominal aortic atherosclerosis ([place])    by xray  . Aortic stenosis 06/2014   moderate-severe  . Arthritis    in lower back (06/24/2015)  . Atrial flutter ([place])    notes 06/24/2015  . AV block, [place] 1 09/06/2017   Archie Endo 09/06/2017  . 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 CAD (coronary artery disease) 2004   a. s/p CABG in 2004 with LIMA-LAD, SVG-RCA, SVG-OM, and SVG-RI  . Carotid stenosis 1999   s/p L CEA  . CHF (congestive heart failure) ([place]) dx'd 06/2015   a. EF 55-60% by echo in 2016. b. 10/2016: EF reduced to 30-35% by TEE in the setting of atrial fibrillation with RVR  . Colon polyp 2005   (Dr. Tiffany Kocher)  .  Compression fracture of L1 lumbar vertebra (HCC) remote  . COPD (chronic obstructive pulmonary disease) ([place]) 03/2011   by xray  . Depression    hx  . Ex-smoker   . GERD (gastroesophageal reflux disease) 2003   h/o duodenal ulcer per EGD as well as esophagitis  . History of colon polyps 2003, 2005   adenomatous Vira Agar)  . HLD (hyperlipidemia)   . HTN (hypertension)   . Psoriasis   . Renal artery stenosis ([place]) 2004   70% bilateral, followed by cards  . T2DM (type 2 diabetes mellitus) ([place]) 1995  . Urge incontinence of urine       Surgical History:  Past Surgical History:  Procedure Laterality Date  . CARDIAC CATHETERIZATION  03/06/2003   No intervention - recommend CABG  . CARDIOVASCULAR STRESS TEST  11/2010   normal perfusion, no evidence of ischemia, EF 62% post exercise  . CARDIOVASCULAR STRESS TEST  11/28/2012   Mild diaphragmatic attenuation; cannot exclude a focal region of nontransmural inferior scar  . CARDIOVERSION N/A 06/25/2015   Procedure: CARDIOVERSION;  Surgeon: Pixie Casino, MD;  Location: [facility] ENDOSCOPY;  Service: Cardiovascular;  Laterality: N/A;  . CARDIOVERSION N/A 11/02/2016   Procedure: CARDIOVERSION;  Surgeon: [name], MD;  Location: [place];  Service: Cardiovascular;  Laterality: N/A;  . CAROTID ENDARTERECTOMY Left 1999   ([place])  . CATARACT EXTRACTION W/ INTRAOCULAR LENS  IMPLANT, BILATERAL Bilateral 01/2013   [place]  . COLONOSCOPY  2003   colon polyp x3 - adenomatous Tiffany Kocher)  . COLONOSCOPY  10/08/2012   2 TA, diverticulosis, int hem, no rpt rec Tiffany Kocher)  . CORONARY ANGIOPLASTY    . CORONARY ARTERY BYPASS GRAFT  03/07/2003   4v CABG ([place]) with LIMA to LAD, vein graft to RCA, 1st obtuse marginal, and ramus intermedius  . ESOPHAGOGASTRODUODENOSCOPY  10/08/2012   nl esophagus, duodenitis and erosive gastropathy, path - gastropathy no Hpylori, no rpt rec  . LUMBAR EPIDURAL INJECTION  03/2017   L5/S1 ([place])  . PERIPHERAL VASCULAR  CATHETERIZATION N/A 02/02/2016   Procedure: Renal Angiography;  Surgeon: [name], MD;  Location: [place] CV LAB;  Service: Cardiovascular;  Laterality: N/A;  . PERIPHERAL VASCULAR CATHETERIZATION N/A 02/02/2016   Procedure: Abdominal Aortogram;  Surgeon: [name], MD;  Location: [place] CV LAB;  Service: Cardiovascular;  Laterality: N/A;  . RENAL DOPPLER  11/29/2011   Celiac&SMA-demonstrated vessel narrowing suggestive of a greater than 50% diameter reduction. Bilateral renal arteries-demonstrated vessel narrowing of 60-99% diameter reduction. Rt Kidney-mid pole lateral simple cyst noted measuring 1.29x0.76x1.11cm and exophytic cyst outside lower pole measuring 1.23x0.96x1.31. Lft Kidney-lateral mid to lower pole simple cyst measuering-1.24x9.83x1.24  . RIGHT/LEFT HEART CATH AND CORONARY/GRAFT ANGIOGRAPHY N/A 10/20/2017   Procedure: RIGHT/LEFT HEART CATH AND CORONARY/GRAFT ANGIOGRAPHY;  Surgeon: Burnell Blanks, MD;  Location: [place] CV LAB;  Service: Cardiovascular;  Laterality: N/A;  . TEE WITHOUT CARDIOVERSION N/A 06/25/2015   Procedure: TRANSESOPHAGEAL ECHOCARDIOGRAM (TEE);  Surgeon: Pixie Casino, MD;  Location: [facility] ENDOSCOPY;  Service: Cardiovascular;  Laterality: N/A;  . TEE WITHOUT CARDIOVERSION N/A 11/02/2016   Procedure: TRANSESOPHAGEAL ECHOCARDIOGRAM (TEE);  Surgeon: [name], MD;  Location: [facility] ENDOSCOPY;  Service: Cardiovascular;  Laterality: N/A;  . TONSILLECTOMY    . UPPER GASTROINTESTINAL ENDOSCOPY  2003   reflux esophagitis, erosive gastropathy, duodenal ulcer     Home Meds: Prior to Admission medications   Medication Sig Start Date End Date Taking? Authorizing Provider  acetaminophen (TYLENOL) 500 MG tablet Take 1 tablet (500 mg total) by mouth 3 (three) times daily. Patient taking differently: Take 500 mg by mouth daily.  02/17/16   Charles Bush, MD  atorvastatin (LIPITOR) 20 MG tablet Take 20 mg by mouth daily.    [provider]  citalopram (CELEXA) 20 MG tablet Take 1 tablet (20 mg total) by mouth daily. 11/08/17   Charles Bush, MD  ELIQUIS 5 MG TABS tablet Take 1 tablet (5 mg total) by mouth 2 (two) times daily. 12/13/17   Charles Bush, MD  finasteride (PROSCAR) 5 MG tablet Take 1 tablet (5 mg total) by mouth daily. 02/15/18   Charles Bush, MD  LANTUS 100 UNIT/ML injection Inject 0.35 mLs (35 Units total) into the skin at bedtime. 02/24/18   Charles Bush, MD  lisinopril (PRINIVIL,ZESTRIL) 40 MG tablet Take 1 tablet (40 mg total) by mouth daily. 01/02/18   Charles Bush, MD  metFORMIN (GLUCOPHAGE) 1000 MG tablet Take 1 tablet (1,000 mg total) by mouth 2 (two) times daily with a meal. 03/06/18   Charles Bush, MD  nitroGLYCERIN (NITROSTAT) 0.4 MG SL tablet Place 1 tablet (0.4 mg total) under the tongue every 5 (five) minutes as needed for chest pain. 03/08/18 06/06/18  Almyra Deforest, PA  Potassium Chloride ER 20 MEQ TBCR Take 20 mEq  by mouth every Monday, Wednesday, Friday, Saturday, and Sunday at 6 PM. 03/29/18   Roxan Hockey, MD  saw palmetto 160 MG capsule Take 320 mg by mouth daily.     [provider]  tamsulosin (FLOMAX) 0.4 MG CAPS capsule Take 1 capsule (0.4 mg total) by mouth daily after supper. 03/13/18   Charles Bush, MD  torsemide (DEMADEX) 20 MG tablet Take 2 tablets (40 mg total) by mouth 2 (two) times daily. sliding scale type of diuretic regimen as outpatient. - torsemide 40 mg BID if weight >205 lb,; otherwise- torsemide 20 mg BID if weight 200-205 lb on home  weight scale 03/28/18   Roxan Hockey, MD  traZODone (DESYREL) 50 MG tablet Take 2 tablets (100 mg total) by mouth at bedtime. 01/23/18   Charles Bush, MD    Allergies:  Allergies  Allergen Reactions  . Gabapentin Other (See Comments)    Leg pain, weakness   . Metoprolol Swelling  . Spironolactone Other (See Comments)    Painful gynecomastia  . Amlodipine Other (See Comments)    Edema  . Other  Other (See Comments)    Horse serum - myalgias  . Rosiglitazone Maleate Other (See Comments)    REACTION: Did not help  . Tricor [Fenofibrate] Other (See Comments)    myalgias    Social History   Socioeconomic History  . Marital status: Widowed    Spouse name: Not on file  . Number of children: Not on file  . Years of education: Not on file  . Highest education level: Not on file  Occupational History  . Not on file  Social Needs  . Financial resource strain: Not on file  . Food insecurity:    Worry: Not on file    Inability: Not on file  . Transportation needs:    Medical: Not on file    Non-medical: Not on file  Tobacco Use  . Smoking status: Former Smoker    Packs/day: 0.50    Years: 36.00    Pack years: 18.00    Types: Cigarettes    Last attempt to quit: 07/23/2013    Years since quitting: 4.7  . Smokeless tobacco: Never Used  Substance and Sexual Activity  . Alcohol use: No  . Drug use: No  . Sexual activity: Not Currently  Lifestyle  . Physical activity:    Days per week: Not on file    Minutes per session: Not on file  . Stress: Not on file  Relationships  . Social connections:    Talks on phone: Not on file    Gets together: Not on file    Attends religious service: Not on file    Active member of club or organization: Not on file    Attends meetings of clubs or organizations: Not on file    Relationship status: Not on file  . Intimate partner violence:    Fear of current or ex partner: Not on file    Emotionally abused: Not on file    Physically abused: Not on file    Forced sexual activity: Not on file  Other Topics Concern  . Not on file  Social History Narrative   Caffeine: 4 cups coffee/day, some tea and soda   Lives with wife   Occupation: Retired   Activity: no regular exercise   Diet: good water, fruits/vegetables daily     Family History  Problem Relation Age of Onset  . Hypertension Father   . Heart disease Father   .  Cancer  Mother        GI cancer  . Diabetes Sister   . Heart disease Sister   . Stroke Sister   . Hypertension Sister   . Cancer Brother        Lung cancer - nonsmoker  . Cancer Maternal Grandmother   . Heart attack Maternal Grandfather   . Heart attack Paternal Grandmother   . Heart attack Paternal Grandfather   . Cancer Brother        Pancreatic cancer  . Lung disease Brother      ROS:  Please see the history of present illness.     All other systems reviewed and negative.    Physical Exam:  Blood pressure (!) 120/58, pulse 82, height 6\' 1"  (1.854 m), weight 205 lb 9.6 oz (93.3 kg). General: Well developed, well nourished male in no acute distress. Head: Normocephalic, atraumatic, sclera non-icteric, no xanthomas, nares are without discharge. EENT: normal  Lymph Nodes:  none Neck: Negative for carotid bruits.  Carotids parvus and tardus JVD 8 cm Back:without scoliosis kyphosis Lungs: Clear bilaterally to auscultation without wheezes, rales, or rhonchi. Breathing is unlabored. Heart: RRR with S1 single S2.  3/6 systolic  murmur . No rubs, or gallops appreciated. Abdomen: Soft, non-tender, non-distended with normoactive bowel sounds. No hepatomegaly. No rebound/guarding. No obvious abdominal masses. Msk:  Strength and tone appear normal for age. Extremities: No clubbing or cyanosis. Tr  edema.  Distal pedal pulses are 2+ and equal bilaterally. Skin: Warm and Dry Neuro: Alert and oriented X 3. CN III-XII intact Grossly normal sensory and motor function . Psych:  Responds to questions appropriately with a normal affect.      Labs: Cardiac Enzymes No results for input(s): CKTOTAL, CKMB, TROPONINI in the last 72 hours. CBC Lab Results  Component Value Date   WBC 6.8 03/25/2018   HGB 12.4 (L) 03/25/2018   HCT 39.3 03/25/2018   MCV 95.4 03/25/2018   PLT 158 03/25/2018   PROTIME: No results for input(s): LABPROT, INR in the last 72 hours. Chemistry No results for input(s): NA,  K, CL, CO2, BUN, CREATININE, CALCIUM, PROT, BILITOT, ALKPHOS, ALT, AST, GLUCOSE in the last 168 hours.  Invalid input(s): LABALBU Lipids Lab Results  Component Value Date   CHOL 131 06/08/2016   HDL 39.50 06/08/2016   LDLCALC 73 06/08/2016   TRIG 95.0 06/08/2016   BNP Pro B Natriuretic peptide (BNP)  Date/Time Value Ref Range Status  01/28/2017 12:31 PM 537.0 (H) 0.0 - 100.0 pg/mL Final  10/28/2016 11:59 AM 812.0 (H) 0.0 - 100.0 pg/mL Final  06/06/2015 11:08 AM 374.0 (H) 0.0 - 100.0 pg/mL Final  05/30/2015 01:00 PM 536.0 (H) 0.0 - 100.0 pg/mL Final   NT-Pro BNP  Date/Time Value Ref Range Status  03/08/2018 03:22 PM 843 (H) 0 - 486 pg/mL Final    Comment:    The following cut-points have been suggested for the use of proBNP for the diagnostic evaluation of heart failure (HF) in patients with acute dyspnea: Modality                     Age           Optimal Cut                            (years)            Point ------------------------------------------------------ Diagnosis (rule in HF)        <  50            450 pg/mL                           50 - 75            900 pg/mL                               >75           1800 pg/mL Exclusion (rule out HF)  Age independent     300 pg/mL    Thyroid Function Tests: No results for input(s): TSH, T4TOTAL, T3FREE, THYROIDAB in the last 72 hours.  Invalid input(s): FREET3 Miscellaneous No results found for: DDIMER  Radiology/Studies:  Dg Chest 2 View  Result Date: 03/25/2018 CLINICAL DATA:  Pt presents for evaluation of sob, 5 lb weight gain overnight and palpitations. Hx of afib, on elliquis. Hx of CHF. EXAM: CHEST - 2 VIEW COMPARISON:  09/06/2017 FINDINGS: There stable changes from prior CABG surgery. The cardiac silhouette is normal in size. No mediastinal or hilar masses or evidence of adenopathy. Linear and reticular opacity is noted in the left upper lobe lingula consistent with scarring, stable. Lungs are otherwise clear. No  pleural effusion or pneumothorax. There is a mild compression fracture the upper lumbar spine, stable. No acute skeletal abnormality. IMPRESSION: No acute cardiopulmonary disease. No evidence of congestive heart failure. Electronically Signed   By: Lajean Manes M.D.   On: 03/25/2018 12:47    EKG: *As above  Data today sinus rhythm at 85 Intervals 32/8/36   Assessment and Plan:  Second-degree AV block type I-symptomatic  Aortic stenosis-severe  Atrial flutter-recurrent-typical   Coronary artery disease with prior bypass surgery normal LV function  Congestive heart failure-chronic-diastolic-class III    The patient has persistent symptoms with a combination of aortic stenosis and second-degree heart block.  ECG today demonstrated one-to-one conduction likely related to the sympathetic tone associated with a difficult walk back to the office.  While auscultating his heart rates were in the 40s which is consistent with his prior tracings.  I suspect that vigorous walking is less limited by his bradycardia than modest walking given the likely relationship to sympathetic tone  Dr. Burt Knack has been following for some time and thinks that his bradycardia is limiting.  I would tend to agree although I suspect there is a much more significant component related to his aortic stenosis.  Would be inclined to recommend pacing and then wait for 3-4 weeks to make sure there is no infection prior to proceeding with TAVR  The benefits and risks were reviewed including but not limited to death,  perforation, infection, lead dislodgement and device malfunction.  The patient understands agrees and is willing to proceed.  Also think it is appropriate that he undergo ablation for his atrial flutter.  The issue for this is going to be timing.  As it is elective, while my initial thought was to do it at the same time as his pacemaker, I think probably the anesthesia and the duration makes it better delayed  until after his TAVR.     [name]

## 2018-04-12 ENCOUNTER — Ambulatory Visit (HOSPITAL_COMMUNITY): Payer: Medicare Other

## 2018-04-12 ENCOUNTER — Ambulatory Visit (HOSPITAL_COMMUNITY)
Admission: RE | Admit: 2018-04-12 | Discharge: 2018-04-12 | Disposition: A | Discharge status: Home / Self Care | Payer: Medicare Other | Source: Ambulatory Visit | Attending: Physician Assistant | Admitting: Physician Assistant

## 2018-04-12 ENCOUNTER — Ambulatory Visit (HOSPITAL_BASED_OUTPATIENT_CLINIC_OR_DEPARTMENT_OTHER)
Admission: RE | Admit: 2018-04-12 | Discharge: 2018-04-12 | Disposition: A | Discharge status: Home / Self Care | Payer: Medicare Other | Source: Ambulatory Visit | Attending: Physician Assistant | Admitting: Physician Assistant

## 2018-04-12 DIAGNOSIS — I350 Nonrheumatic aortic (valve) stenosis: Secondary | ICD-10-CM | POA: Diagnosis not present

## 2018-04-12 DIAGNOSIS — K5730 Diverticulosis of large intestine without perforation or abscess without bleeding: Secondary | ICD-10-CM | POA: Diagnosis not present

## 2018-04-12 DIAGNOSIS — I2510 Atherosclerotic heart disease of native coronary artery without angina pectoris: Secondary | ICD-10-CM | POA: Insufficient documentation

## 2018-04-12 DIAGNOSIS — I700 Atherosclerosis of aorta: Secondary | ICD-10-CM | POA: Insufficient documentation

## 2018-04-12 LAB — PULMONARY FUNCTION TEST
DL/VA % pred: 75 %
DL/VA: 3.59 ml/min/mmHg/L
DLCO UNC % PRED: 48 %
DLCO unc: 17.8 ml/min/mmHg
FEF 25-75 Pre: 1.15 L/sec
FEF2575-%Pred-Pre: 55 %
FEV1-%Pred-Pre: 61 %
FEV1-Pre: 1.92 L
FEV1FVC-%Pred-Pre: 98 %
FEV6-%PRED-PRE: 66 %
FEV6-Pre: 2.72 L
FEV6FVC-%PRED-PRE: 106 %
FVC-%Pred-Pre: 62 %
FVC-Pre: 2.74 L
Pre FEV1/FVC ratio: 70 %
Pre FEV6/FVC Ratio: 99 %
RV % PRED: 106 %
RV: 3.1 L
TLC % pred: 78 %
TLC: 6.04 L

## 2018-04-12 LAB — POCT I-STAT CREATININE: Creatinine, Ser: 1.3 mg/dL — ABNORMAL HIGH (ref 0.61–1.24)

## 2018-04-12 MED ORDER — IOPAMIDOL (ISOVUE-370) INJECTION 76%
100.0000 mL | Freq: Once | INTRAVENOUS | Status: AC | PRN
  Administered 2018-04-12: 100 mL via INTRAVENOUS

## 2018-04-12 MED ORDER — IOPAMIDOL (ISOVUE-370) INJECTION 76%
INTRAVENOUS | Status: AC
  Administered 2018-04-12: 100 mL
  Filled 2018-04-12: qty 100

## 2018-04-12 NOTE — Progress Notes (Signed)
Bilateral carotid duplex completed. 1% to 39% ICA stenosis. Vertebral artery flow is antegrade. [facility], [place] 04/12/2018 11:58 AM

## 2018-04-13 DIAGNOSIS — Z794 Long term (current) use of insulin: Secondary | ICD-10-CM | POA: Diagnosis not present

## 2018-04-13 DIAGNOSIS — I350 Nonrheumatic aortic (valve) stenosis: Secondary | ICD-10-CM | POA: Diagnosis not present

## 2018-04-13 DIAGNOSIS — L409 Psoriasis, unspecified: Secondary | ICD-10-CM | POA: Diagnosis not present

## 2018-04-13 DIAGNOSIS — I701 Atherosclerosis of renal artery: Secondary | ICD-10-CM | POA: Diagnosis not present

## 2018-04-13 DIAGNOSIS — J449 Chronic obstructive pulmonary disease, unspecified: Secondary | ICD-10-CM | POA: Diagnosis not present

## 2018-04-13 DIAGNOSIS — Z791 Long term (current) use of non-steroidal anti-inflammatories (NSAID): Secondary | ICD-10-CM | POA: Diagnosis not present

## 2018-04-13 DIAGNOSIS — E785 Hyperlipidemia, unspecified: Secondary | ICD-10-CM | POA: Diagnosis not present

## 2018-04-13 DIAGNOSIS — Z87891 Personal history of nicotine dependence: Secondary | ICD-10-CM | POA: Diagnosis not present

## 2018-04-13 DIAGNOSIS — I483 Typical atrial flutter: Secondary | ICD-10-CM | POA: Diagnosis not present

## 2018-04-13 DIAGNOSIS — M545 Low back pain: Secondary | ICD-10-CM | POA: Diagnosis not present

## 2018-04-13 DIAGNOSIS — I5033 Acute on chronic diastolic (congestive) heart failure: Secondary | ICD-10-CM | POA: Diagnosis not present

## 2018-04-13 DIAGNOSIS — E1165 Type 2 diabetes mellitus with hyperglycemia: Secondary | ICD-10-CM | POA: Diagnosis not present

## 2018-04-13 DIAGNOSIS — Z955 Presence of coronary angioplasty implant and graft: Secondary | ICD-10-CM | POA: Diagnosis not present

## 2018-04-13 DIAGNOSIS — I2510 Atherosclerotic heart disease of native coronary artery without angina pectoris: Secondary | ICD-10-CM | POA: Diagnosis not present

## 2018-04-13 DIAGNOSIS — M5116 Intervertebral disc disorders with radiculopathy, lumbar region: Secondary | ICD-10-CM | POA: Diagnosis not present

## 2018-04-13 DIAGNOSIS — I4891 Unspecified atrial fibrillation: Secondary | ICD-10-CM | POA: Diagnosis not present

## 2018-04-13 DIAGNOSIS — G8929 Other chronic pain: Secondary | ICD-10-CM | POA: Diagnosis not present

## 2018-04-13 DIAGNOSIS — Z951 Presence of aortocoronary bypass graft: Secondary | ICD-10-CM | POA: Diagnosis not present

## 2018-04-13 DIAGNOSIS — I6522 Occlusion and stenosis of left carotid artery: Secondary | ICD-10-CM | POA: Diagnosis not present

## 2018-04-13 DIAGNOSIS — I441 Atrioventricular block, second degree: Secondary | ICD-10-CM | POA: Diagnosis not present

## 2018-04-13 DIAGNOSIS — I110 Hypertensive heart disease with heart failure: Secondary | ICD-10-CM | POA: Diagnosis not present

## 2018-04-13 DIAGNOSIS — K219 Gastro-esophageal reflux disease without esophagitis: Secondary | ICD-10-CM | POA: Diagnosis not present

## 2018-04-14 ENCOUNTER — Inpatient Hospital Stay: Payer: Medicare Other | Admitting: Family Medicine

## 2018-04-14 DIAGNOSIS — Z951 Presence of aortocoronary bypass graft: Secondary | ICD-10-CM | POA: Diagnosis not present

## 2018-04-14 DIAGNOSIS — I5033 Acute on chronic diastolic (congestive) heart failure: Secondary | ICD-10-CM | POA: Diagnosis not present

## 2018-04-14 DIAGNOSIS — I110 Hypertensive heart disease with heart failure: Secondary | ICD-10-CM | POA: Diagnosis not present

## 2018-04-14 DIAGNOSIS — E1165 Type 2 diabetes mellitus with hyperglycemia: Secondary | ICD-10-CM | POA: Diagnosis not present

## 2018-04-14 DIAGNOSIS — I483 Typical atrial flutter: Secondary | ICD-10-CM | POA: Diagnosis not present

## 2018-04-14 DIAGNOSIS — Z87891 Personal history of nicotine dependence: Secondary | ICD-10-CM | POA: Diagnosis not present

## 2018-04-14 DIAGNOSIS — I2510 Atherosclerotic heart disease of native coronary artery without angina pectoris: Secondary | ICD-10-CM | POA: Diagnosis not present

## 2018-04-14 DIAGNOSIS — Z794 Long term (current) use of insulin: Secondary | ICD-10-CM | POA: Diagnosis not present

## 2018-04-14 DIAGNOSIS — I441 Atrioventricular block, second degree: Secondary | ICD-10-CM | POA: Diagnosis not present

## 2018-04-14 DIAGNOSIS — M5116 Intervertebral disc disorders with radiculopathy, lumbar region: Secondary | ICD-10-CM | POA: Diagnosis not present

## 2018-04-14 DIAGNOSIS — J449 Chronic obstructive pulmonary disease, unspecified: Secondary | ICD-10-CM | POA: Diagnosis not present

## 2018-04-14 DIAGNOSIS — M545 Low back pain: Secondary | ICD-10-CM | POA: Diagnosis not present

## 2018-04-14 DIAGNOSIS — Z955 Presence of coronary angioplasty implant and graft: Secondary | ICD-10-CM | POA: Diagnosis not present

## 2018-04-14 DIAGNOSIS — E785 Hyperlipidemia, unspecified: Secondary | ICD-10-CM | POA: Diagnosis not present

## 2018-04-14 DIAGNOSIS — G8929 Other chronic pain: Secondary | ICD-10-CM | POA: Diagnosis not present

## 2018-04-14 DIAGNOSIS — L409 Psoriasis, unspecified: Secondary | ICD-10-CM | POA: Diagnosis not present

## 2018-04-14 DIAGNOSIS — I701 Atherosclerosis of renal artery: Secondary | ICD-10-CM | POA: Diagnosis not present

## 2018-04-14 DIAGNOSIS — K219 Gastro-esophageal reflux disease without esophagitis: Secondary | ICD-10-CM | POA: Diagnosis not present

## 2018-04-14 DIAGNOSIS — I6522 Occlusion and stenosis of left carotid artery: Secondary | ICD-10-CM | POA: Diagnosis not present

## 2018-04-14 DIAGNOSIS — Z791 Long term (current) use of non-steroidal anti-inflammatories (NSAID): Secondary | ICD-10-CM | POA: Diagnosis not present

## 2018-04-14 DIAGNOSIS — I4891 Unspecified atrial fibrillation: Secondary | ICD-10-CM | POA: Diagnosis not present

## 2018-04-14 DIAGNOSIS — I350 Nonrheumatic aortic (valve) stenosis: Secondary | ICD-10-CM | POA: Diagnosis not present

## 2018-04-17 ENCOUNTER — Other Ambulatory Visit: Payer: Medicare Other | Admitting: *Deleted

## 2018-04-17 DIAGNOSIS — I701 Atherosclerosis of renal artery: Secondary | ICD-10-CM | POA: Diagnosis not present

## 2018-04-17 DIAGNOSIS — I4892 Unspecified atrial flutter: Secondary | ICD-10-CM | POA: Diagnosis not present

## 2018-04-17 DIAGNOSIS — M5116 Intervertebral disc disorders with radiculopathy, lumbar region: Secondary | ICD-10-CM | POA: Diagnosis not present

## 2018-04-17 DIAGNOSIS — I350 Nonrheumatic aortic (valve) stenosis: Secondary | ICD-10-CM | POA: Diagnosis not present

## 2018-04-17 DIAGNOSIS — I2510 Atherosclerotic heart disease of native coronary artery without angina pectoris: Secondary | ICD-10-CM

## 2018-04-17 DIAGNOSIS — I110 Hypertensive heart disease with heart failure: Secondary | ICD-10-CM | POA: Diagnosis not present

## 2018-04-17 DIAGNOSIS — Z791 Long term (current) use of non-steroidal anti-inflammatories (NSAID): Secondary | ICD-10-CM | POA: Diagnosis not present

## 2018-04-17 DIAGNOSIS — Z87891 Personal history of nicotine dependence: Secondary | ICD-10-CM | POA: Diagnosis not present

## 2018-04-17 DIAGNOSIS — I6522 Occlusion and stenosis of left carotid artery: Secondary | ICD-10-CM | POA: Diagnosis not present

## 2018-04-17 DIAGNOSIS — I4891 Unspecified atrial fibrillation: Secondary | ICD-10-CM | POA: Diagnosis not present

## 2018-04-17 DIAGNOSIS — M545 Low back pain: Secondary | ICD-10-CM | POA: Diagnosis not present

## 2018-04-17 DIAGNOSIS — J449 Chronic obstructive pulmonary disease, unspecified: Secondary | ICD-10-CM | POA: Diagnosis not present

## 2018-04-17 DIAGNOSIS — Z951 Presence of aortocoronary bypass graft: Secondary | ICD-10-CM | POA: Diagnosis not present

## 2018-04-17 DIAGNOSIS — K219 Gastro-esophageal reflux disease without esophagitis: Secondary | ICD-10-CM | POA: Diagnosis not present

## 2018-04-17 DIAGNOSIS — Z955 Presence of coronary angioplasty implant and graft: Secondary | ICD-10-CM | POA: Diagnosis not present

## 2018-04-17 DIAGNOSIS — L409 Psoriasis, unspecified: Secondary | ICD-10-CM | POA: Diagnosis not present

## 2018-04-17 DIAGNOSIS — I483 Typical atrial flutter: Secondary | ICD-10-CM | POA: Diagnosis not present

## 2018-04-17 DIAGNOSIS — I5033 Acute on chronic diastolic (congestive) heart failure: Secondary | ICD-10-CM

## 2018-04-17 DIAGNOSIS — I441 Atrioventricular block, second degree: Secondary | ICD-10-CM | POA: Diagnosis not present

## 2018-04-17 DIAGNOSIS — G8929 Other chronic pain: Secondary | ICD-10-CM | POA: Diagnosis not present

## 2018-04-17 DIAGNOSIS — E1165 Type 2 diabetes mellitus with hyperglycemia: Secondary | ICD-10-CM | POA: Diagnosis not present

## 2018-04-17 DIAGNOSIS — Z794 Long term (current) use of insulin: Secondary | ICD-10-CM | POA: Diagnosis not present

## 2018-04-17 DIAGNOSIS — E785 Hyperlipidemia, unspecified: Secondary | ICD-10-CM | POA: Diagnosis not present

## 2018-04-17 LAB — CBC WITH DIFFERENTIAL/PLATELET
Basophils Absolute: 0 10*3/uL (ref 0.0–0.2)
Basos: 1 %
EOS (ABSOLUTE): 0.2 10*3/uL (ref 0.0–0.4)
EOS: 4 %
HEMATOCRIT: 41.4 % (ref 37.5–51.0)
HEMOGLOBIN: 13.3 g/dL (ref 13.0–17.7)
IMMATURE GRANULOCYTES: 1 %
Immature Grans (Abs): 0 10*3/uL (ref 0.0–0.1)
Lymphocytes Absolute: 1.3 10*3/uL (ref 0.7–3.1)
Lymphs: 25 %
MCH: 30.6 pg (ref 26.6–33.0)
MCHC: 32.1 g/dL (ref 31.5–35.7)
MCV: 95 fL (ref 79–97)
MONOCYTES: 11 %
Monocytes Absolute: 0.6 10*3/uL (ref 0.1–0.9)
NEUTROS PCT: 58 %
Neutrophils Absolute: 3 10*3/uL (ref 1.4–7.0)
Platelets: 190 10*3/uL (ref 150–450)
RBC: 4.35 x10E6/uL (ref 4.14–5.80)
RDW: 13.4 % (ref 12.3–15.4)
WBC: 5.2 10*3/uL (ref 3.4–10.8)

## 2018-04-17 LAB — BASIC METABOLIC PANEL
BUN/Creatinine Ratio: 20 (ref 10–24)
BUN: 26 mg/dL (ref 8–27)
CO2: 27 mmol/L (ref 20–29)
CREATININE: 1.31 mg/dL — AB (ref 0.76–1.27)
Calcium: 10 mg/dL (ref 8.6–10.2)
Chloride: 97 mmol/L (ref 96–106)
GFR calc Af Amer: 58 mL/min/{1.73_m2} — ABNORMAL LOW (ref >59)
GFR calc non Af Amer: 50 mL/min/{1.73_m2} — ABNORMAL LOW (ref >59)
GLUCOSE: 276 mg/dL — AB (ref 65–99)
Potassium: 5.2 mmol/L (ref 3.5–5.2)
Sodium: 138 mmol/L (ref 134–144)

## 2018-04-18 DIAGNOSIS — Z794 Long term (current) use of insulin: Secondary | ICD-10-CM | POA: Diagnosis not present

## 2018-04-18 DIAGNOSIS — I350 Nonrheumatic aortic (valve) stenosis: Secondary | ICD-10-CM | POA: Diagnosis not present

## 2018-04-18 DIAGNOSIS — I441 Atrioventricular block, second degree: Secondary | ICD-10-CM | POA: Diagnosis not present

## 2018-04-18 DIAGNOSIS — I483 Typical atrial flutter: Secondary | ICD-10-CM | POA: Diagnosis not present

## 2018-04-18 DIAGNOSIS — Z791 Long term (current) use of non-steroidal anti-inflammatories (NSAID): Secondary | ICD-10-CM | POA: Diagnosis not present

## 2018-04-18 DIAGNOSIS — J449 Chronic obstructive pulmonary disease, unspecified: Secondary | ICD-10-CM | POA: Diagnosis not present

## 2018-04-18 DIAGNOSIS — K219 Gastro-esophageal reflux disease without esophagitis: Secondary | ICD-10-CM | POA: Diagnosis not present

## 2018-04-18 DIAGNOSIS — G8929 Other chronic pain: Secondary | ICD-10-CM | POA: Diagnosis not present

## 2018-04-18 DIAGNOSIS — Z955 Presence of coronary angioplasty implant and graft: Secondary | ICD-10-CM | POA: Diagnosis not present

## 2018-04-18 DIAGNOSIS — I4891 Unspecified atrial fibrillation: Secondary | ICD-10-CM | POA: Diagnosis not present

## 2018-04-18 DIAGNOSIS — I701 Atherosclerosis of renal artery: Secondary | ICD-10-CM | POA: Diagnosis not present

## 2018-04-18 DIAGNOSIS — Z951 Presence of aortocoronary bypass graft: Secondary | ICD-10-CM | POA: Diagnosis not present

## 2018-04-18 DIAGNOSIS — I110 Hypertensive heart disease with heart failure: Secondary | ICD-10-CM | POA: Diagnosis not present

## 2018-04-18 DIAGNOSIS — Z87891 Personal history of nicotine dependence: Secondary | ICD-10-CM | POA: Diagnosis not present

## 2018-04-18 DIAGNOSIS — I2510 Atherosclerotic heart disease of native coronary artery without angina pectoris: Secondary | ICD-10-CM | POA: Diagnosis not present

## 2018-04-18 DIAGNOSIS — I6522 Occlusion and stenosis of left carotid artery: Secondary | ICD-10-CM | POA: Diagnosis not present

## 2018-04-18 DIAGNOSIS — I5033 Acute on chronic diastolic (congestive) heart failure: Secondary | ICD-10-CM | POA: Diagnosis not present

## 2018-04-18 DIAGNOSIS — L409 Psoriasis, unspecified: Secondary | ICD-10-CM | POA: Diagnosis not present

## 2018-04-18 DIAGNOSIS — E1165 Type 2 diabetes mellitus with hyperglycemia: Secondary | ICD-10-CM | POA: Diagnosis not present

## 2018-04-18 DIAGNOSIS — M5116 Intervertebral disc disorders with radiculopathy, lumbar region: Secondary | ICD-10-CM | POA: Diagnosis not present

## 2018-04-18 DIAGNOSIS — E785 Hyperlipidemia, unspecified: Secondary | ICD-10-CM | POA: Diagnosis not present

## 2018-04-18 DIAGNOSIS — M545 Low back pain: Secondary | ICD-10-CM | POA: Diagnosis not present

## 2018-04-19 DIAGNOSIS — I110 Hypertensive heart disease with heart failure: Secondary | ICD-10-CM | POA: Diagnosis not present

## 2018-04-19 DIAGNOSIS — I6522 Occlusion and stenosis of left carotid artery: Secondary | ICD-10-CM | POA: Diagnosis not present

## 2018-04-19 DIAGNOSIS — I350 Nonrheumatic aortic (valve) stenosis: Secondary | ICD-10-CM | POA: Diagnosis not present

## 2018-04-19 DIAGNOSIS — G8929 Other chronic pain: Secondary | ICD-10-CM | POA: Diagnosis not present

## 2018-04-19 DIAGNOSIS — L409 Psoriasis, unspecified: Secondary | ICD-10-CM | POA: Diagnosis not present

## 2018-04-19 DIAGNOSIS — Z791 Long term (current) use of non-steroidal anti-inflammatories (NSAID): Secondary | ICD-10-CM | POA: Diagnosis not present

## 2018-04-19 DIAGNOSIS — Z87891 Personal history of nicotine dependence: Secondary | ICD-10-CM | POA: Diagnosis not present

## 2018-04-19 DIAGNOSIS — M5116 Intervertebral disc disorders with radiculopathy, lumbar region: Secondary | ICD-10-CM | POA: Diagnosis not present

## 2018-04-19 DIAGNOSIS — I701 Atherosclerosis of renal artery: Secondary | ICD-10-CM | POA: Diagnosis not present

## 2018-04-19 DIAGNOSIS — I483 Typical atrial flutter: Secondary | ICD-10-CM | POA: Diagnosis not present

## 2018-04-19 DIAGNOSIS — I2510 Atherosclerotic heart disease of native coronary artery without angina pectoris: Secondary | ICD-10-CM | POA: Diagnosis not present

## 2018-04-19 DIAGNOSIS — J449 Chronic obstructive pulmonary disease, unspecified: Secondary | ICD-10-CM | POA: Diagnosis not present

## 2018-04-19 DIAGNOSIS — K219 Gastro-esophageal reflux disease without esophagitis: Secondary | ICD-10-CM | POA: Diagnosis not present

## 2018-04-19 DIAGNOSIS — I5033 Acute on chronic diastolic (congestive) heart failure: Secondary | ICD-10-CM | POA: Diagnosis not present

## 2018-04-19 DIAGNOSIS — I4891 Unspecified atrial fibrillation: Secondary | ICD-10-CM | POA: Diagnosis not present

## 2018-04-19 DIAGNOSIS — E1165 Type 2 diabetes mellitus with hyperglycemia: Secondary | ICD-10-CM | POA: Diagnosis not present

## 2018-04-19 DIAGNOSIS — I441 Atrioventricular block, second degree: Secondary | ICD-10-CM | POA: Diagnosis not present

## 2018-04-19 DIAGNOSIS — E785 Hyperlipidemia, unspecified: Secondary | ICD-10-CM | POA: Diagnosis not present

## 2018-04-19 DIAGNOSIS — Z951 Presence of aortocoronary bypass graft: Secondary | ICD-10-CM | POA: Diagnosis not present

## 2018-04-19 DIAGNOSIS — Z794 Long term (current) use of insulin: Secondary | ICD-10-CM | POA: Diagnosis not present

## 2018-04-19 DIAGNOSIS — Z955 Presence of coronary angioplasty implant and graft: Secondary | ICD-10-CM | POA: Diagnosis not present

## 2018-04-19 DIAGNOSIS — M545 Low back pain: Secondary | ICD-10-CM | POA: Diagnosis not present

## 2018-04-21 ENCOUNTER — Ambulatory Visit (HOSPITAL_COMMUNITY)
Admission: RE | Admit: 2018-04-21 | Discharge: 2018-04-22 | Disposition: A | Discharge status: Home / Self Care | Payer: Medicare Other | Source: Ambulatory Visit | Attending: Internal Medicine | Admitting: Internal Medicine

## 2018-04-21 ENCOUNTER — Ambulatory Visit (HOSPITAL_COMMUNITY)
Admission: RE | Disposition: A | Discharge status: Home / Self Care | Payer: Self-pay | Source: Ambulatory Visit | Attending: Internal Medicine

## 2018-04-21 ENCOUNTER — Other Ambulatory Visit: Payer: Self-pay

## 2018-04-21 ENCOUNTER — Encounter (HOSPITAL_COMMUNITY): Payer: Self-pay | Admitting: Internal Medicine

## 2018-04-21 DIAGNOSIS — Z7902 Long term (current) use of antithrombotics/antiplatelets: Secondary | ICD-10-CM | POA: Insufficient documentation

## 2018-04-21 DIAGNOSIS — R001 Bradycardia, unspecified: Secondary | ICD-10-CM | POA: Diagnosis not present

## 2018-04-21 DIAGNOSIS — I700 Atherosclerosis of aorta: Secondary | ICD-10-CM | POA: Insufficient documentation

## 2018-04-21 DIAGNOSIS — Z87891 Personal history of nicotine dependence: Secondary | ICD-10-CM | POA: Insufficient documentation

## 2018-04-21 DIAGNOSIS — E119 Type 2 diabetes mellitus without complications: Secondary | ICD-10-CM | POA: Diagnosis not present

## 2018-04-21 DIAGNOSIS — Z951 Presence of aortocoronary bypass graft: Secondary | ICD-10-CM | POA: Insufficient documentation

## 2018-04-21 DIAGNOSIS — I350 Nonrheumatic aortic (valve) stenosis: Secondary | ICD-10-CM | POA: Diagnosis not present

## 2018-04-21 DIAGNOSIS — Z8249 Family history of ischemic heart disease and other diseases of the circulatory system: Secondary | ICD-10-CM | POA: Insufficient documentation

## 2018-04-21 DIAGNOSIS — I4892 Unspecified atrial flutter: Secondary | ICD-10-CM

## 2018-04-21 DIAGNOSIS — Z79899 Other long term (current) drug therapy: Secondary | ICD-10-CM | POA: Insufficient documentation

## 2018-04-21 DIAGNOSIS — Z955 Presence of coronary angioplasty implant and graft: Secondary | ICD-10-CM | POA: Insufficient documentation

## 2018-04-21 DIAGNOSIS — J449 Chronic obstructive pulmonary disease, unspecified: Secondary | ICD-10-CM | POA: Diagnosis not present

## 2018-04-21 DIAGNOSIS — L409 Psoriasis, unspecified: Secondary | ICD-10-CM | POA: Diagnosis not present

## 2018-04-21 DIAGNOSIS — Z833 Family history of diabetes mellitus: Secondary | ICD-10-CM | POA: Insufficient documentation

## 2018-04-21 DIAGNOSIS — Z794 Long term (current) use of insulin: Secondary | ICD-10-CM | POA: Diagnosis not present

## 2018-04-21 DIAGNOSIS — I2510 Atherosclerotic heart disease of native coronary artery without angina pectoris: Secondary | ICD-10-CM | POA: Diagnosis not present

## 2018-04-21 DIAGNOSIS — I110 Hypertensive heart disease with heart failure: Secondary | ICD-10-CM | POA: Diagnosis not present

## 2018-04-21 DIAGNOSIS — Z86010 Personal history of colonic polyps: Secondary | ICD-10-CM | POA: Insufficient documentation

## 2018-04-21 DIAGNOSIS — E785 Hyperlipidemia, unspecified: Secondary | ICD-10-CM | POA: Diagnosis not present

## 2018-04-21 DIAGNOSIS — I441 Atrioventricular block, second degree: Secondary | ICD-10-CM | POA: Diagnosis not present

## 2018-04-21 DIAGNOSIS — F329 Major depressive disorder, single episode, unspecified: Secondary | ICD-10-CM | POA: Insufficient documentation

## 2018-04-21 DIAGNOSIS — Z959 Presence of cardiac and vascular implant and graft, unspecified: Secondary | ICD-10-CM

## 2018-04-21 DIAGNOSIS — Z950 Presence of cardiac pacemaker: Secondary | ICD-10-CM | POA: Diagnosis not present

## 2018-04-21 DIAGNOSIS — Z823 Family history of stroke: Secondary | ICD-10-CM | POA: Insufficient documentation

## 2018-04-21 DIAGNOSIS — I4891 Unspecified atrial fibrillation: Secondary | ICD-10-CM | POA: Insufficient documentation

## 2018-04-21 DIAGNOSIS — Z9841 Cataract extraction status, right eye: Secondary | ICD-10-CM | POA: Insufficient documentation

## 2018-04-21 DIAGNOSIS — Z9889 Other specified postprocedural states: Secondary | ICD-10-CM | POA: Insufficient documentation

## 2018-04-21 DIAGNOSIS — Z888 Allergy status to other drugs, medicaments and biological substances status: Secondary | ICD-10-CM | POA: Diagnosis not present

## 2018-04-21 DIAGNOSIS — Z9842 Cataract extraction status, left eye: Secondary | ICD-10-CM | POA: Insufficient documentation

## 2018-04-21 DIAGNOSIS — I5032 Chronic diastolic (congestive) heart failure: Secondary | ICD-10-CM | POA: Diagnosis not present

## 2018-04-21 HISTORY — PX: PACEMAKER IMPLANT: EP1218

## 2018-04-21 LAB — SURGICAL PCR SCREEN
MRSA, PCR: NEGATIVE
Staphylococcus aureus: NEGATIVE

## 2018-04-21 LAB — GLUCOSE, CAPILLARY
GLUCOSE-CAPILLARY: 135 mg/dL — AB (ref 70–99)
Glucose-Capillary: 123 mg/dL — ABNORMAL HIGH (ref 70–99)
Glucose-Capillary: 235 mg/dL — ABNORMAL HIGH (ref 70–99)

## 2018-04-21 SURGERY — PACEMAKER IMPLANT

## 2018-04-21 MED ORDER — INSULIN GLARGINE 100 UNIT/ML ~~LOC~~ SOLN
35.0000 [IU] | Freq: Every day | SUBCUTANEOUS | Status: DC
  Administered 2018-04-21: 35 [IU] via SUBCUTANEOUS
  Filled 2018-04-21: qty 0.35

## 2018-04-21 MED ORDER — LIDOCAINE HCL (PF) 1 % IJ SOLN
INTRAMUSCULAR | Status: DC | PRN
  Administered 2018-04-21: 45 mL

## 2018-04-21 MED ORDER — TRAZODONE HCL 100 MG PO TABS
100.0000 mg | ORAL_TABLET | Freq: Every day | ORAL | Status: DC
  Administered 2018-04-21: 100 mg via ORAL
  Filled 2018-04-21: qty 1

## 2018-04-21 MED ORDER — CITALOPRAM HYDROBROMIDE 20 MG PO TABS
20.0000 mg | ORAL_TABLET | Freq: Every day | ORAL | Status: DC
  Administered 2018-04-22: 20 mg via ORAL
  Filled 2018-04-21: qty 1

## 2018-04-21 MED ORDER — SODIUM CHLORIDE 0.9 % IV SOLN
INTRAVENOUS | Status: AC
  Filled 2018-04-21: qty 2

## 2018-04-21 MED ORDER — LIDOCAINE HCL 1 % IJ SOLN
INTRAMUSCULAR | Status: AC
  Filled 2018-04-21: qty 20

## 2018-04-21 MED ORDER — CEFAZOLIN SODIUM-DEXTROSE 1-4 GM/50ML-% IV SOLN
1.0000 g | Freq: Four times a day (QID) | INTRAVENOUS | Status: AC
  Administered 2018-04-21 – 2018-04-22 (×3): 1 g via INTRAVENOUS
  Filled 2018-04-21 (×3): qty 50

## 2018-04-21 MED ORDER — HEPARIN (PORCINE) IN NACL 1000-0.9 UT/500ML-% IV SOLN
INTRAVENOUS | Status: AC
  Filled 2018-04-21: qty 500

## 2018-04-21 MED ORDER — ONDANSETRON HCL 4 MG/2ML IJ SOLN: 4.0000 mg | Freq: Four times a day (QID) | INTRAMUSCULAR | Status: DC | PRN

## 2018-04-21 MED ORDER — MUPIROCIN 2 % EX OINT
1.0000 | TOPICAL_OINTMENT | Freq: Once | CUTANEOUS | Status: AC
Start: 2018-04-21 — End: 2018-04-21
  Administered 2018-04-21: 1 via TOPICAL
  Filled 2018-04-21: qty 22

## 2018-04-21 MED ORDER — SODIUM CHLORIDE 0.9 % IV SOLN
80.0000 mg | INTRAVENOUS | Status: AC
  Administered 2018-04-21: 80 mg

## 2018-04-21 MED ORDER — PANTOPRAZOLE SODIUM 20 MG PO TBEC
20.0000 mg | DELAYED_RELEASE_TABLET | Freq: Every day | ORAL | Status: DC
  Administered 2018-04-22: 20 mg via ORAL
  Filled 2018-04-21: qty 1

## 2018-04-21 MED ORDER — NITROGLYCERIN 0.4 MG SL SUBL: 0.4000 mg | SUBLINGUAL_TABLET | SUBLINGUAL | Status: DC | PRN

## 2018-04-21 MED ORDER — CEFAZOLIN SODIUM-DEXTROSE 2-4 GM/100ML-% IV SOLN
INTRAVENOUS | Status: AC
  Filled 2018-04-21: qty 100

## 2018-04-21 MED ORDER — FINASTERIDE 5 MG PO TABS
5.0000 mg | ORAL_TABLET | Freq: Every day | ORAL | Status: DC
  Administered 2018-04-22: 5 mg via ORAL
  Filled 2018-04-21: qty 1

## 2018-04-21 MED ORDER — SAW PALMETTO (SERENOA REPENS) 160 MG PO CAPS: 320.0000 mg | ORAL_CAPSULE | Freq: Every day | ORAL | Status: DC

## 2018-04-21 MED ORDER — HEPARIN (PORCINE) IN NACL 1000-0.9 UT/500ML-% IV SOLN
INTRAVENOUS | Status: DC | PRN
  Administered 2018-04-21: 500 mL

## 2018-04-21 MED ORDER — MUPIROCIN 2 % EX OINT
TOPICAL_OINTMENT | CUTANEOUS | Status: AC
  Administered 2018-04-21: 1 via TOPICAL
  Filled 2018-04-21: qty 22

## 2018-04-21 MED ORDER — CHLORHEXIDINE GLUCONATE 4 % EX LIQD
60.0000 mL | Freq: Once | CUTANEOUS | Status: DC
  Filled 2018-04-21: qty 60

## 2018-04-21 MED ORDER — SODIUM CHLORIDE 0.9 % IV SOLN: INTRAVENOUS | Status: AC

## 2018-04-21 MED ORDER — SODIUM CHLORIDE 0.9 % IV SOLN
INTRAVENOUS | Status: DC
  Administered 2018-04-21: 08:00:00 via INTRAVENOUS

## 2018-04-21 MED ORDER — ACETAMINOPHEN 325 MG PO TABS
325.0000 mg | ORAL_TABLET | ORAL | Status: DC | PRN
  Administered 2018-04-21: 650 mg via ORAL
  Filled 2018-04-21: qty 2

## 2018-04-21 MED ORDER — CEFAZOLIN SODIUM-DEXTROSE 2-4 GM/100ML-% IV SOLN
2.0000 g | INTRAVENOUS | Status: AC
  Administered 2018-04-21: 2 g via INTRAVENOUS

## 2018-04-21 MED ORDER — ATORVASTATIN CALCIUM 20 MG PO TABS
20.0000 mg | ORAL_TABLET | Freq: Every day | ORAL | Status: DC
  Administered 2018-04-22: 20 mg via ORAL
  Filled 2018-04-21: qty 1

## 2018-04-21 MED ORDER — METFORMIN HCL 500 MG PO TABS
1000.0000 mg | ORAL_TABLET | Freq: Two times a day (BID) | ORAL | Status: DC
  Administered 2018-04-22: 1000 mg via ORAL
  Filled 2018-04-21: qty 2

## 2018-04-21 MED ORDER — POTASSIUM CHLORIDE CRYS ER 20 MEQ PO TBCR
20.0000 meq | EXTENDED_RELEASE_TABLET | ORAL | Status: DC
  Administered 2018-04-21: 20 meq via ORAL
  Filled 2018-04-21: qty 1

## 2018-04-21 MED ORDER — TORSEMIDE 20 MG PO TABS
40.0000 mg | ORAL_TABLET | Freq: Two times a day (BID) | ORAL | Status: DC
  Administered 2018-04-21 – 2018-04-22 (×2): 40 mg via ORAL
  Filled 2018-04-21 (×3): qty 2

## 2018-04-21 MED ORDER — LISINOPRIL 40 MG PO TABS
40.0000 mg | ORAL_TABLET | Freq: Every day | ORAL | Status: DC
  Administered 2018-04-22: 40 mg via ORAL
  Filled 2018-04-21: qty 1

## 2018-04-21 MED ORDER — TAMSULOSIN HCL 0.4 MG PO CAPS
0.4000 mg | ORAL_CAPSULE | Freq: Every day | ORAL | Status: DC
  Administered 2018-04-21: 0.4 mg via ORAL
  Filled 2018-04-21: qty 1

## 2018-04-21 SURGICAL SUPPLY — 8 items
CABLE SURGICAL S-101-97-12 (CABLE) ×3 IMPLANT
HEMOSTAT SURGICEL 2X4 FIBR (HEMOSTASIS) ×3 IMPLANT
LEAD CAPSURE NOVUS 5076-52CM (Lead) ×3 IMPLANT
LEAD CAPSURE NOVUS 5076-58CM (Lead) ×3 IMPLANT
PACEMAKER ASSURITY DR-RF (Pacemaker) ×3 IMPLANT
PAD DEFIB LIFELINK (PAD) ×3 IMPLANT
SHEATH CLASSIC 7F (SHEATH) ×6 IMPLANT
TRAY PACEMAKER INSERTION (PACKS) ×3 IMPLANT

## 2018-04-21 NOTE — Interval H&P Note (Signed)
History and Physical Interval Note:  04/21/2018 7:51 AM  Charles [name]  has presented today for surgery, with the diagnosis of second degree heart block  The various methods of treatment have been discussed with the patient and family. After consideration of risks, benefits and other options for treatment, the patient has consented to  Procedure(s): PACEMAKER IMPLANT -- Dual Chamber (N/A) as a surgical intervention .  The patient's history has been reviewed, patient examined, no change in status, stable for surgery.  I have reviewed the patient's chart and labs.  Questions were answered to the patient's satisfaction.     [name]

## 2018-04-21 NOTE — Interval H&P Note (Signed)
History and Physical Interval Note:  04/21/2018 11:00 AM  Charles [name]  has presented today for surgery, with the diagnosis of second degree heart block  The various methods of treatment have been discussed with the patient and family. After consideration of risks, benefits and other options for treatment, the patient has consented to  Procedure(s): PACEMAKER IMPLANT -- Dual Chamber (N/A) as a surgical intervention .  The patient's history has been reviewed, patient examined, no change in status, stable for surgery.  I have reviewed the patient's chart and labs.  Questions were answered to the patient's satisfaction.     [name]

## 2018-04-21 NOTE — Progress Notes (Signed)
04/21/2018 1345 Received pt to room 4E-21 from Cath Lab, pt is S/P PPM placement.  Pt is A&O, no C/O pain voiced.  Tele monitor applied and CCMD notified.  CHG bath given.  Pacemaker insertion site, slightly swollen, otherwise WNL.  Oriented to room, call light and bed.  Call bell in reach, family at bedside. [place]

## 2018-04-22 ENCOUNTER — Ambulatory Visit (HOSPITAL_COMMUNITY): Payer: Medicare Other

## 2018-04-22 ENCOUNTER — Other Ambulatory Visit: Payer: Self-pay

## 2018-04-22 DIAGNOSIS — Z86010 Personal history of colonic polyps: Secondary | ICD-10-CM | POA: Diagnosis not present

## 2018-04-22 DIAGNOSIS — Z794 Long term (current) use of insulin: Secondary | ICD-10-CM | POA: Diagnosis not present

## 2018-04-22 DIAGNOSIS — Z955 Presence of coronary angioplasty implant and graft: Secondary | ICD-10-CM | POA: Diagnosis not present

## 2018-04-22 DIAGNOSIS — Z7902 Long term (current) use of antithrombotics/antiplatelets: Secondary | ICD-10-CM | POA: Diagnosis not present

## 2018-04-22 DIAGNOSIS — L409 Psoriasis, unspecified: Secondary | ICD-10-CM | POA: Diagnosis not present

## 2018-04-22 DIAGNOSIS — I700 Atherosclerosis of aorta: Secondary | ICD-10-CM | POA: Diagnosis not present

## 2018-04-22 DIAGNOSIS — I2510 Atherosclerotic heart disease of native coronary artery without angina pectoris: Secondary | ICD-10-CM | POA: Diagnosis not present

## 2018-04-22 DIAGNOSIS — Z9841 Cataract extraction status, right eye: Secondary | ICD-10-CM | POA: Diagnosis not present

## 2018-04-22 DIAGNOSIS — J449 Chronic obstructive pulmonary disease, unspecified: Secondary | ICD-10-CM | POA: Diagnosis not present

## 2018-04-22 DIAGNOSIS — I110 Hypertensive heart disease with heart failure: Secondary | ICD-10-CM | POA: Diagnosis not present

## 2018-04-22 DIAGNOSIS — Z87891 Personal history of nicotine dependence: Secondary | ICD-10-CM | POA: Diagnosis not present

## 2018-04-22 DIAGNOSIS — I441 Atrioventricular block, second degree: Secondary | ICD-10-CM

## 2018-04-22 DIAGNOSIS — E785 Hyperlipidemia, unspecified: Secondary | ICD-10-CM | POA: Diagnosis not present

## 2018-04-22 DIAGNOSIS — I350 Nonrheumatic aortic (valve) stenosis: Secondary | ICD-10-CM | POA: Diagnosis not present

## 2018-04-22 DIAGNOSIS — R001 Bradycardia, unspecified: Secondary | ICD-10-CM | POA: Diagnosis not present

## 2018-04-22 DIAGNOSIS — I5032 Chronic diastolic (congestive) heart failure: Secondary | ICD-10-CM | POA: Diagnosis not present

## 2018-04-22 DIAGNOSIS — Z888 Allergy status to other drugs, medicaments and biological substances status: Secondary | ICD-10-CM | POA: Diagnosis not present

## 2018-04-22 DIAGNOSIS — E119 Type 2 diabetes mellitus without complications: Secondary | ICD-10-CM | POA: Diagnosis not present

## 2018-04-22 DIAGNOSIS — Z951 Presence of aortocoronary bypass graft: Secondary | ICD-10-CM | POA: Diagnosis not present

## 2018-04-22 DIAGNOSIS — Z9842 Cataract extraction status, left eye: Secondary | ICD-10-CM | POA: Diagnosis not present

## 2018-04-22 DIAGNOSIS — R079 Chest pain, unspecified: Secondary | ICD-10-CM | POA: Diagnosis not present

## 2018-04-22 DIAGNOSIS — I4892 Unspecified atrial flutter: Secondary | ICD-10-CM | POA: Diagnosis not present

## 2018-04-22 DIAGNOSIS — Z950 Presence of cardiac pacemaker: Secondary | ICD-10-CM | POA: Diagnosis not present

## 2018-04-22 DIAGNOSIS — I4891 Unspecified atrial fibrillation: Secondary | ICD-10-CM | POA: Diagnosis not present

## 2018-04-22 DIAGNOSIS — Z79899 Other long term (current) drug therapy: Secondary | ICD-10-CM | POA: Diagnosis not present

## 2018-04-22 LAB — GLUCOSE, CAPILLARY
Glucose-Capillary: 67 mg/dL — ABNORMAL LOW (ref 70–99)
Glucose-Capillary: 121 mg/dL — ABNORMAL HIGH (ref 70–99)

## 2018-04-22 MED ORDER — APIXABAN 5 MG PO TABS: 5.0000 mg | ORAL_TABLET | Freq: Two times a day (BID) | ORAL | 6 refills | Status: DC

## 2018-04-22 NOTE — Progress Notes (Signed)
Progress Note  Patient Name: Charles [name] Date of Encounter: 04/22/2018  Primary Cardiologist: [name], MD   Subjective   Denies chest pain or shortness of breath.  Minimal incisional tenderness.  Inpatient Medications    Scheduled Meds: . atorvastatin  20 mg Oral Daily  . citalopram  20 mg Oral Daily  . finasteride  5 mg Oral Daily  . insulin glargine  35 Units Subcutaneous QHS  . lisinopril  40 mg Oral Daily  . metFORMIN  1,000 mg Oral BID WC  . pantoprazole  20 mg Oral Daily  . potassium chloride SA  20 mEq Oral Q M,W,F,S,S -1800  . tamsulosin  0.4 mg Oral QPC supper  . torsemide  40 mg Oral BID  . traZODone  100 mg Oral QHS   Continuous Infusions:  PRN Meds: acetaminophen, nitroGLYCERIN, ondansetron (ZOFRAN) IV   Vital Signs    Vitals:   04/21/18 1346 04/21/18 2008 04/22/18 0424 04/22/18 0907  BP: (!) 188/85 (!) 177/87 128/80 137/82  Pulse:  80 87   Resp:  18 (!) 23   Temp: 97.7 F (36.5 C) 97.8 F (36.6 C) 98.3 F (36.8 C)   TempSrc: Oral Oral Oral   SpO2:  97% 93%   Weight:      Height:        Intake/Output Summary (Last 24 hours) at 04/22/2018 1001 Last data filed at 04/22/2018 7829 Gross per 24 hour  Intake 450 ml  Output 1625 ml  Net -1175 ml   Filed Weights   04/21/18 0753  Weight: 204 lb (92.5 kg)    Telemetry    Normal sinus rhythm with ventricular pacing- Personally Reviewed  ECG    None- Personally Reviewed  Physical Exam   GEN: No acute distress.   Neck: No JVD Cardiac: RRR, no murmurs, rubs, or gallops.  Respiratory: Clear to auscultation bilaterally.  No hematoma or ecchymoses GI: Soft, nontender, non-distended  MS: No edema; No deformity. Neuro:  Nonfocal  Psych: Normal affect   Labs    Chemistry Recent Labs  Lab 04/17/18 1158  NA 138  K 5.2  CL 97  CO2 27  GLUCOSE 276*  BUN 26  CREATININE 1.31*  CALCIUM 10.0  GFRNONAA 50*  GFRAA 58*     Hematology Recent Labs  Lab 04/17/18 1158  WBC 5.2    RBC 4.35  HGB 13.3  HCT 41.4  MCV 95  MCH 30.6  MCHC 32.1  RDW 13.4  PLT 190    Cardiac EnzymesNo results for input(s): TROPONINI in the last 168 hours. No results for input(s): TROPIPOC in the last 168 hours.   BNPNo results for input(s): BNP, PROBNP in the last 168 hours.   DDimer No results for input(s): DDIMER in the last 168 hours.   Radiology    No results found.  Cardiac Studies   None  Patient Profile     84 y.o. male admitted for permanent pacemaker insertion  Assessment & Plan    1.  Pacemaker -his dual-chamber pacemaker is working normally.  He is stable for discharge with usual follow-up.  Interrogation under my direct supervision demonstrates normal device function. 2.  Coronary artery disease -he denies anginal symptoms.  He will continue his current medications.  For questions or updates, please contact [place] Please consult [email or web address] for contact info under Cardiology/STEMI.      Signed, Cristopher Peru, MD  04/22/2018, 10:01 AM  Patient ID: Charles [name], male   DOB:  01-09-1932, 84 y.o.   MRN: [id number]

## 2018-04-22 NOTE — Discharge Instructions (Signed)
° ° °  Supplemental Discharge Instructions for  Pacemaker Patients  Activity No heavy lifting or vigorous activity with your left/right arm for 6 to 8 weeks.  Do not raise your left/right arm above your head for one week.  Gradually raise your affected arm as drawn below.             04/25/18                     04/26/18                    04/27/18                   04/28/18 __  NO DRIVING for 1 week   ; you may begin driving on  01/02/01   .  WOUND CARE - Keep the wound area clean and dry.  Do not get this area wet for one week. No showers for one week; you may shower on 04/28/18  . - The tape/steri-strips on your wound will fall off; do not pull them off.  No bandage is needed on the site.  DO  NOT apply any creams, oils, or ointments to the wound area. - If you notice any drainage or discharge from the wound, any swelling or bruising at the site, or you develop a fever > 101? F after you are discharged home, call the office at once.  Special Instructions - You are still able to use cellular telephones; use the ear opposite the side where you have your pacemaker/defibrillator.  Avoid carrying your cellular phone near your device. - When traveling through airports, show security personnel your identification card to avoid being screened in the metal detectors.  Ask the security personnel to use the hand wand. - Avoid arc welding equipment, MRI testing (magnetic resonance imaging), TENS units (transcutaneous nerve stimulators).  Call the office for questions about other devices. - Avoid electrical appliances that are in poor condition or are not properly grounded. - Microwave ovens are safe to be near or to operate.

## 2018-04-22 NOTE — Discharge Summary (Addendum)
Discharge Summary    Patient ID: Charles [name],  MRN: [id number], DOB/AGE: 07/02/1932 84 y.o.  Admit date: 04/21/2018 Discharge date: 04/22/2018  Primary Care Provider: [name] Primary Cardiologist: [name], MD  Discharge Diagnoses    Principal Problem:   AV block, 2nd degree Active Problems:   S/P placement of cardiac pacemaker 7/191/9 [employer]   Cardiovascular disease   Aortic stenosis, severe   Bradycardia   Allergies Allergies  Allergen Reactions  . Gabapentin Other (See Comments)    Leg pain, weakness   . Metoprolol Swelling  . Spironolactone Other (See Comments)    Painful gynecomastia  . Amlodipine Other (See Comments)    Edema  . Other Other (See Comments)    Horse serum - myalgias  . Rosiglitazone Maleate Other (See Comments)    REACTION: Did not help  . Tricor [Fenofibrate] Other (See Comments)    myalgias    Diagnostic Studies/Procedures    04/21/18 PPM   [employer] Jude  [name]  EP PPM/ICD IMPLANT  Order# [id number]  Reading physician: Deboraha Sprang, MD Ordering physician: Deboraha Sprang, MD Study date: 04/21/18  Physicians   Panel Physicians Referring Physician Case Authorizing Physician  Deboraha Sprang, MD (Primary)    Procedures   PACEMAKER IMPLANT -- Dual Chamber  Procedural Details/Technique   Technical Details Preop DX:: 2 AV blocke  Post op DX:: same   Procedure dual pacemaker implantation    _____________   History of Present Illness     84 yo male with hx of CAD and CABG, A flutter and severe AS undergoing TAVR work up.  He was seen by Dr. Caryl Comes for AV block that was symptomatic.  Dr. Caryl Comes felt he needed pacer and wait for 3-4 weeks to make sure no infection before TAVR.  Pt admitted 04/21/18 and underwent pacer as above.   Hospital Course     Consultants: none   Pt did not have complications after PPM.  He did well overnight .  Today he was seen by Dr. Lovena Le and pacemaker interrogation was done and  found to have normal function.  He has no angina.    CXR:  Was reviewed and no pneumothorax.    Pt was seen and evaluated by Dr. Lovena Le and found stable for discharge.  He will hold Eliquis for 3 days and resume on 04/25/18.   _____________  Discharge Vitals Blood pressure 137/82, pulse 87, temperature 98.3 F (36.8 C), temperature source Oral, resp. rate (!) 23, height 6\' 1"  (1.854 m), weight 204 lb (92.5 kg), SpO2 93 %.  Filed Weights   04/21/18 0753  Weight: 204 lb (92.5 kg)    Labs & Radiologic Studies    CBC No results for input(s): WBC, NEUTROABS, HGB, HCT, MCV, PLT in the last 72 hours. Basic Metabolic Panel No results for input(s): NA, K, CL, CO2, GLUCOSE, BUN, CREATININE, CALCIUM, MG, PHOS in the last 72 hours. Liver Function Tests No results for input(s): AST, ALT, ALKPHOS, BILITOT, PROT, ALBUMIN in the last 72 hours. No results for input(s): LIPASE, AMYLASE in the last 72 hours. Cardiac Enzymes No results for input(s): CKTOTAL, CKMB, CKMBINDEX, TROPONINI in the last 72 hours. BNP Invalid input(s): POCBNP D-Dimer No results for input(s): DDIMER in the last 72 hours. Hemoglobin A1C No results for input(s): HGBA1C in the last 72 hours. Fasting Lipid Panel No results for input(s): CHOL, HDL, LDLCALC, TRIG, CHOLHDL, LDLDIRECT in the last 72 hours. Thyroid Function  Tests No results for input(s): TSH, T4TOTAL, T3FREE, THYROIDAB in the last 72 hours.  Invalid input(s): FREET3 _____________  Dg Chest 2 View  Result Date: 03/25/2018 CLINICAL DATA:  Pt presents for evaluation of sob, 5 lb weight gain overnight and palpitations. Hx of afib, on elliquis. Hx of CHF. EXAM: CHEST - 2 VIEW COMPARISON:  09/06/2017 FINDINGS: There stable changes from prior CABG surgery. The cardiac silhouette is normal in size. No mediastinal or hilar masses or evidence of adenopathy. Linear and reticular opacity is noted in the left upper lobe lingula consistent with scarring, stable. Lungs are  otherwise clear. No pleural effusion or pneumothorax. There is a mild compression fracture the upper lumbar spine, stable. No acute skeletal abnormality. IMPRESSION: No acute cardiopulmonary disease. No evidence of congestive heart failure. Electronically Signed   By: Lajean Manes M.D.   On: 03/25/2018 12:47   Ct Coronary Morph W/cta Cor W/score W/ca W/cm &/or Wo/cm  Addendum Date: 04/13/2018   ADDENDUM REPORT: 04/13/2018 14:43 CLINICAL DATA:  84 year old male with severe aortic stenosis being evaluated for a TAVR procedure. EXAM: Cardiac TAVR CT TECHNIQUE: The patient was scanned on a [employer]. A 120 kV retrospective scan was triggered in the descending thoracic aorta at 111 HU's. Gantry rotation speed was 250 msecs and collimation was .6 mm. No beta blockade or nitro were given. The 3D data set was reconstructed in 5% intervals of the R-R cycle. Systolic and diastolic phases were analyzed on a dedicated work station using MPR, MIP and VRT modes. The patient received 80 cc of contrast. FINDINGS: Aortic Valve: Trileaflet aortic valve with severely thickened and calcified leaflets and severely decreased leaflets opening. There are no calcifications extending into the LVOT. Aorta: Normal size with diffuse atheroma and calcifications, no dissection. Sinotubular Junction: 31 x 30 mm Ascending Thoracic Aorta: 38 x 36 mm Aortic Arch: 32 x 29 mm Descending Thoracic Aorta: 30 x 29 mm Sinus of Valsalva Measurements: Non-coronary: 35 mm Right -coronary: 32 mm Left -coronary: 35 mm Coronary Artery Height above Annulus: Left Main: 14 mm Right Coronary: 16 mm Virtual Basal Annulus Measurements: Maximum/Minimum Diameter: 27.9 x 20.1 mm Mean Diameter: 23.5 mm Perimeter: 76.8 mm Area: 433 mm2 Optimum Fluoroscopic Angle for Delivery: LAO 5 CAU 5 IMPRESSION: 1. Trileaflet aortic valve with severely thickened and calcified leaflets and severely decreased leaflets opening. There are no calcifications extending into  the LVOT. Annular measurements suitable for delivery of a 26 mm Edwards-SAPIEN 3 valve. 2. Sufficient coronary to annulus distance. 3. Optimum Fluoroscopic Angle for Delivery:  LAO 5 CAU 5 4. No thrombus in the left atrial appendage. Electronically Signed   By: Ena Dawley   On: 04/13/2018 14:43   Result Date: 04/13/2018 EXAM: OVER-READ INTERPRETATION  CT CHEST The following report is an over-read performed by radiologist Dr. Vinnie Langton of [facility], [place] on 04/12/2018. This over-read does not include interpretation of cardiac or coronary anatomy or pathology. The coronary calcium score/coronary CTA interpretation by the cardiologist is attached. COMPARISON:  None. FINDINGS: Extracardiac findings will be described separately under dictation for contemporaneously obtained CTA chest, abdomen and pelvis. IMPRESSION: Please see separate dictation for contemporaneously obtained CTA chest, abdomen and pelvis dated 04/12/2018 for full description of relevant extracardiac findings. Electronically Signed: By: Vinnie Langton M.D. On: 04/12/2018 14:24   Ct Angio Chest Aorta W &/or Wo Contrast  Result Date: 04/12/2018 CLINICAL DATA:  84 year old male with history of severe aortic stenosis. Preprocedural study prior to potential transcatheter  aortic valve replacement (TAVR) procedure. EXAM: CT ANGIOGRAPHY CHEST, ABDOMEN AND PELVIS TECHNIQUE: Multidetector CT imaging through the chest, abdomen and pelvis was performed using the standard protocol during bolus administration of intravenous contrast. Multiplanar reconstructed images and MIPs were obtained and reviewed to evaluate the vascular anatomy. CONTRAST:  148mL ISOVUE-370 IOPAMIDOL (ISOVUE-370) INJECTION 76% COMPARISON:  CT the chest, abdomen and pelvis 07/18/2004. FINDINGS: CTA CHEST FINDINGS Cardiovascular: Heart size is mildly enlarged. There is no significant pericardial fluid, thickening or pericardial calcification. There is aortic  atherosclerosis, as well as atherosclerosis of the great vessels of the mediastinum and the coronary arteries, including calcified atherosclerotic plaque in the left main, left anterior descending, left circumflex and right coronary arteries. Status post median sternotomy for CABG including LIMA to the LAD. Severe thickening calcification of the aortic valve. Moderate calcifications of the mitral annulus. Mediastinum/Lymph Nodes: No pathologically enlarged mediastinal or hilar lymph nodes. Esophagus is unremarkable in appearance. No axillary lymphadenopathy. Lungs/Pleura: Mild linear scarring throughout the lower lungs bilaterally. No acute consolidative airspace disease. No pleural effusions. No suspicious appearing pulmonary nodules or masses. Musculoskeletal/Soft Tissues: Median sternotomy wires. No aggressive appearing lytic or blastic lesions are noted in the visualized portions of the skeleton. CTA ABDOMEN AND PELVIS FINDINGS Hepatobiliary: No suspicious cystic or solid hepatic lesions. No intra or extrahepatic biliary ductal dilatation. Gallbladder is normal in appearance. Pancreas: No pancreatic mass. No pancreatic ductal dilatation. No pancreatic or peripancreatic fluid or inflammatory changes. Spleen: Unremarkable. Adrenals/Urinary Tract: Indeterminate 1.6 cm right adrenal nodule (axial image 90 of series 14), stable in size dating back to 2005, presumably a benign lesion such as an adenoma. Left adrenal gland is normal in appearance. Low-attenuation lesions in both kidneys compatible with simple cysts, largest of which is exophytic measuring 1.8 cm in the lower pole of the right kidney. Several other subcentimeter low-attenuation lesions in both kidneys are too small to definitively characterize, but are also statistically likely to represent small cysts. No hydroureteronephrosis. Urinary bladder is normal in appearance. Stomach/Bowel: Normal appearance of the stomach. No pathologic dilatation of small  bowel or colon. Numerous colonic diverticulae are noted, without surrounding inflammatory changes to suggest an acute diverticulitis at this time. Normal appendix. Vascular/Lymphatic: Aortic atherosclerosis with fusiform ectasia of the infrarenal abdominal aorta which measures up to 2.9 x 2.7 cm. No aneurysm or dissection noted in the abdominal or pelvic vasculature. Vascular findings and measurements pertinent to potential TAVR procedure, as detailed below. Celiac axis is widely patent, but there is mild stenosis in the proximal splenic artery. Superior mesenteric artery is extensively disease proximally where there is mild stenosis. Inferior mesenteric artery appears patent without hemodynamically significant stenosis. Single renal arteries bilaterally with some mild stenosis in the left renal artery. No lymphadenopathy identified in the abdomen or pelvis. Reproductive: Prostate gland and seminal vesicles are unremarkable in appearance. No lymphadenopathy noted in the abdomen or pelvis. Other: No significant volume of ascites.  No pneumoperitoneum. Musculoskeletal: Chronic appearing compression fracture of superior endplate of L1 with 42% loss of anterior vertebral body height. There are no aggressive appearing lytic or blastic lesions noted in the visualized portions of the skeleton. VASCULAR MEASUREMENTS PERTINENT TO TAVR: AORTA: Minimal Aortic Diameter-15 x 15 mm Severity of Aortic Calcification-severe RIGHT PELVIS: Right Common Iliac Artery - Minimal Diameter-9.5 x 10.8 mm Tortuosity-mild Calcification-moderate to severe Right External Iliac Artery - Minimal Diameter-9.0 x 6.9 mm Tortuosity-very severe Calcification-mild Right Common Femoral Artery - Minimal Diameter-6.8 x 7.2 mm Tortuosity-mild Calcification-severe LEFT PELVIS: Left Common  Iliac Artery - Minimal Diameter-9.2 x 10.3 mm Tortuosity-mild Calcification-moderate to severe Left External Iliac Artery - Minimal Diameter-7.8 x 7.4 mm Tortuosity-severe  Calcification-mild Left Common Femoral Artery - Minimal Diameter-8.2 x 7.4 mm Tortuosity-mild Calcification-severe Review of the MIP images confirms the above findings. IMPRESSION: 1. Vascular findings and measurements pertinent to potential TAVR procedure, as detailed above. 2. Severe thickening calcification of the aortic valve, compatible with the reported clinical history of severe aortic stenosis. 3. Aortic atherosclerosis, in addition to left main and 3 vessel coronary artery disease. Status post median sternotomy for CABG including LIMA to the LAD. 4. Colonic diverticulosis without evidence of acute diverticulitis at this time. 5. Additional incidental findings, as above. Aortic Atherosclerosis (ICD10-I70.0). Electronically Signed   By: Vinnie Langton M.D.   On: 04/12/2018 15:30   Ct Angio Abdomen Pelvis  W &/or Wo Contrast  Result Date: 04/12/2018 CLINICAL DATA:  84 year old male with history of severe aortic stenosis. Preprocedural study prior to potential transcatheter aortic valve replacement (TAVR) procedure. EXAM: CT ANGIOGRAPHY CHEST, ABDOMEN AND PELVIS TECHNIQUE: Multidetector CT imaging through the chest, abdomen and pelvis was performed using the standard protocol during bolus administration of intravenous contrast. Multiplanar reconstructed images and MIPs were obtained and reviewed to evaluate the vascular anatomy. CONTRAST:  116mL ISOVUE-370 IOPAMIDOL (ISOVUE-370) INJECTION 76% COMPARISON:  CT the chest, abdomen and pelvis 07/18/2004. FINDINGS: CTA CHEST FINDINGS Cardiovascular: Heart size is mildly enlarged. There is no significant pericardial fluid, thickening or pericardial calcification. There is aortic atherosclerosis, as well as atherosclerosis of the great vessels of the mediastinum and the coronary arteries, including calcified atherosclerotic plaque in the left main, left anterior descending, left circumflex and right coronary arteries. Status post median sternotomy for CABG  including LIMA to the LAD. Severe thickening calcification of the aortic valve. Moderate calcifications of the mitral annulus. Mediastinum/Lymph Nodes: No pathologically enlarged mediastinal or hilar lymph nodes. Esophagus is unremarkable in appearance. No axillary lymphadenopathy. Lungs/Pleura: Mild linear scarring throughout the lower lungs bilaterally. No acute consolidative airspace disease. No pleural effusions. No suspicious appearing pulmonary nodules or masses. Musculoskeletal/Soft Tissues: Median sternotomy wires. No aggressive appearing lytic or blastic lesions are noted in the visualized portions of the skeleton. CTA ABDOMEN AND PELVIS FINDINGS Hepatobiliary: No suspicious cystic or solid hepatic lesions. No intra or extrahepatic biliary ductal dilatation. Gallbladder is normal in appearance. Pancreas: No pancreatic mass. No pancreatic ductal dilatation. No pancreatic or peripancreatic fluid or inflammatory changes. Spleen: Unremarkable. Adrenals/Urinary Tract: Indeterminate 1.6 cm right adrenal nodule (axial image 90 of series 14), stable in size dating back to 2005, presumably a benign lesion such as an adenoma. Left adrenal gland is normal in appearance. Low-attenuation lesions in both kidneys compatible with simple cysts, largest of which is exophytic measuring 1.8 cm in the lower pole of the right kidney. Several other subcentimeter low-attenuation lesions in both kidneys are too small to definitively characterize, but are also statistically likely to represent small cysts. No hydroureteronephrosis. Urinary bladder is normal in appearance. Stomach/Bowel: Normal appearance of the stomach. No pathologic dilatation of small bowel or colon. Numerous colonic diverticulae are noted, without surrounding inflammatory changes to suggest an acute diverticulitis at this time. Normal appendix. Vascular/Lymphatic: Aortic atherosclerosis with fusiform ectasia of the infrarenal abdominal aorta which measures up to  2.9 x 2.7 cm. No aneurysm or dissection noted in the abdominal or pelvic vasculature. Vascular findings and measurements pertinent to potential TAVR procedure, as detailed below. Celiac axis is widely patent, but there is mild stenosis in  the proximal splenic artery. Superior mesenteric artery is extensively disease proximally where there is mild stenosis. Inferior mesenteric artery appears patent without hemodynamically significant stenosis. Single renal arteries bilaterally with some mild stenosis in the left renal artery. No lymphadenopathy identified in the abdomen or pelvis. Reproductive: Prostate gland and seminal vesicles are unremarkable in appearance. No lymphadenopathy noted in the abdomen or pelvis. Other: No significant volume of ascites.  No pneumoperitoneum. Musculoskeletal: Chronic appearing compression fracture of superior endplate of L1 with 16% loss of anterior vertebral body height. There are no aggressive appearing lytic or blastic lesions noted in the visualized portions of the skeleton. VASCULAR MEASUREMENTS PERTINENT TO TAVR: AORTA: Minimal Aortic Diameter-15 x 15 mm Severity of Aortic Calcification-severe RIGHT PELVIS: Right Common Iliac Artery - Minimal Diameter-9.5 x 10.8 mm Tortuosity-mild Calcification-moderate to severe Right External Iliac Artery - Minimal Diameter-9.0 x 6.9 mm Tortuosity-very severe Calcification-mild Right Common Femoral Artery - Minimal Diameter-6.8 x 7.2 mm Tortuosity-mild Calcification-severe LEFT PELVIS: Left Common Iliac Artery - Minimal Diameter-9.2 x 10.3 mm Tortuosity-mild Calcification-moderate to severe Left External Iliac Artery - Minimal Diameter-7.8 x 7.4 mm Tortuosity-severe Calcification-mild Left Common Femoral Artery - Minimal Diameter-8.2 x 7.4 mm Tortuosity-mild Calcification-severe Review of the MIP images confirms the above findings. IMPRESSION: 1. Vascular findings and measurements pertinent to potential TAVR procedure, as detailed above. 2.  Severe thickening calcification of the aortic valve, compatible with the reported clinical history of severe aortic stenosis. 3. Aortic atherosclerosis, in addition to left main and 3 vessel coronary artery disease. Status post median sternotomy for CABG including LIMA to the LAD. 4. Colonic diverticulosis without evidence of acute diverticulitis at this time. 5. Additional incidental findings, as above. Aortic Atherosclerosis (ICD10-I70.0). Electronically Signed   By: Vinnie Langton M.D.   On: 04/12/2018 15:30   Disposition   Pt is being discharged home today in good condition.  Follow-up Plans & Appointments    Follow-up Information    [facility] Follow up on 05/02/2018.   Specialty:  Cardiology Why:  2:00PM, wound check visit Contact information: [address], [address] [place]       Deboraha Sprang, MD Follow up on 08/17/2018.   Specialty:  Cardiology Why:  1:45PM Contact information: [address] [place] [address] [phone]            Discharge Medications   Allergies as of 04/22/2018      Reactions   Gabapentin Other (See Comments)   Leg pain, weakness   Metoprolol Swelling   Spironolactone Other (See Comments)   Painful gynecomastia   Amlodipine Other (See Comments)   Edema   Other Other (See Comments)   Horse serum - myalgias   Rosiglitazone Maleate Other (See Comments)   REACTION: Did not help   Tricor [fenofibrate] Other (See Comments)   myalgias      Medication List    TAKE these medications   acetaminophen 500 MG tablet Commonly known as:  TYLENOL Take 1 tablet (500 mg total) by mouth 3 (three) times daily. What changed:  when to take this   apixaban 5 MG Tabs tablet Commonly known as:  ELIQUIS Take 1 tablet (5 mg total) by mouth 2 (two) times daily. Start taking on:  04/25/2018 What changed:  These instructions start on 04/25/2018. If you are unsure  what to do until then, ask your doctor or other care provider.   atorvastatin 20 MG tablet Commonly known as:  LIPITOR TAKE 1 TABLET DAILY.   citalopram 20 MG tablet Commonly known as:  CELEXA Take 1 tablet (20 mg total) by mouth daily.   finasteride 5 MG tablet Commonly known as:  PROSCAR Take 1 tablet (5 mg total) by mouth daily.   LANTUS 100 UNIT/ML injection Generic drug:  insulin glargine Inject 0.35 mLs (35 Units total) into the skin at bedtime.   lisinopril 40 MG tablet Commonly known as:  PRINIVIL,ZESTRIL Take 1 tablet (40 mg total) by mouth daily.   metFORMIN 1000 MG tablet Commonly known as:  GLUCOPHAGE Take 1 tablet (1,000 mg total) by mouth 2 (two) times daily with a meal.   nitroGLYCERIN 0.4 MG SL tablet Commonly known as:  NITROSTAT Place 1 tablet (0.4 mg total) under the tongue every 5 (five) minutes as needed for chest pain.   pantoprazole 20 MG tablet Commonly known as:  PROTONIX Take 1 tablet by mouth daily.   Potassium Chloride ER 20 MEQ Tbcr Take 20 mEq by mouth every Monday, Wednesday, Friday, Saturday, and Sunday at 6 PM.   saw palmetto 160 MG capsule Take 320 mg by mouth daily.   tamsulosin 0.4 MG Caps capsule Commonly known as:  FLOMAX Take 1 capsule (0.4 mg total) by mouth daily after supper.   torsemide 20 MG tablet Commonly known as:  DEMADEX Take 2 tablets (40 mg total) by mouth 2 (two) times daily. sliding scale type of diuretic regimen as outpatient. - torsemide 40 mg BID if weight >205 lb,; otherwise- torsemide 20 mg BID if weight 200-205 lb on home  weight scale   traZODone 50 MG tablet Commonly known as:  DESYREL Take 2 tablets (100 mg total) by mouth at bedtime.        Acute coronary syndrome (MI, NSTEMI, STEMI, etc) this admission?: No.    Outstanding Labs/Studies     Duration of Discharge Encounter   Greater than 30 minutes including physician time.  Signed, Cecilie Kicks, NP 04/22/2018, 10:56 AM  EP  Attending  Patient seen and examined. See my rounding note. He is stable for DC.  Charles [name].D.

## 2018-04-22 NOTE — Progress Notes (Signed)
04/22/2018 11:43 AM Discharge AVS meds taken today and those due this evening reviewed.  Follow-up appointments and when to call md reviewed.  D/C IV and TELE.  Questions and concerns addressed.   D/C home per orders. [place]

## 2018-04-24 ENCOUNTER — Telehealth: Payer: Self-pay | Admitting: Family Medicine

## 2018-04-24 DIAGNOSIS — Z951 Presence of aortocoronary bypass graft: Secondary | ICD-10-CM | POA: Diagnosis not present

## 2018-04-24 DIAGNOSIS — M545 Low back pain: Secondary | ICD-10-CM | POA: Diagnosis not present

## 2018-04-24 DIAGNOSIS — G8929 Other chronic pain: Secondary | ICD-10-CM | POA: Diagnosis not present

## 2018-04-24 DIAGNOSIS — J449 Chronic obstructive pulmonary disease, unspecified: Secondary | ICD-10-CM | POA: Diagnosis not present

## 2018-04-24 DIAGNOSIS — Z794 Long term (current) use of insulin: Secondary | ICD-10-CM | POA: Diagnosis not present

## 2018-04-24 DIAGNOSIS — I2510 Atherosclerotic heart disease of native coronary artery without angina pectoris: Secondary | ICD-10-CM | POA: Diagnosis not present

## 2018-04-24 DIAGNOSIS — L409 Psoriasis, unspecified: Secondary | ICD-10-CM | POA: Diagnosis not present

## 2018-04-24 DIAGNOSIS — I701 Atherosclerosis of renal artery: Secondary | ICD-10-CM | POA: Diagnosis not present

## 2018-04-24 DIAGNOSIS — I350 Nonrheumatic aortic (valve) stenosis: Secondary | ICD-10-CM | POA: Diagnosis not present

## 2018-04-24 DIAGNOSIS — E1165 Type 2 diabetes mellitus with hyperglycemia: Secondary | ICD-10-CM | POA: Diagnosis not present

## 2018-04-24 DIAGNOSIS — K219 Gastro-esophageal reflux disease without esophagitis: Secondary | ICD-10-CM | POA: Diagnosis not present

## 2018-04-24 DIAGNOSIS — Z87891 Personal history of nicotine dependence: Secondary | ICD-10-CM | POA: Diagnosis not present

## 2018-04-24 DIAGNOSIS — I4891 Unspecified atrial fibrillation: Secondary | ICD-10-CM | POA: Diagnosis not present

## 2018-04-24 DIAGNOSIS — I483 Typical atrial flutter: Secondary | ICD-10-CM | POA: Diagnosis not present

## 2018-04-24 DIAGNOSIS — I6522 Occlusion and stenosis of left carotid artery: Secondary | ICD-10-CM | POA: Diagnosis not present

## 2018-04-24 DIAGNOSIS — E785 Hyperlipidemia, unspecified: Secondary | ICD-10-CM | POA: Diagnosis not present

## 2018-04-24 DIAGNOSIS — I110 Hypertensive heart disease with heart failure: Secondary | ICD-10-CM | POA: Diagnosis not present

## 2018-04-24 DIAGNOSIS — Z791 Long term (current) use of non-steroidal anti-inflammatories (NSAID): Secondary | ICD-10-CM | POA: Diagnosis not present

## 2018-04-24 DIAGNOSIS — I5033 Acute on chronic diastolic (congestive) heart failure: Secondary | ICD-10-CM | POA: Diagnosis not present

## 2018-04-24 DIAGNOSIS — Z955 Presence of coronary angioplasty implant and graft: Secondary | ICD-10-CM | POA: Diagnosis not present

## 2018-04-24 DIAGNOSIS — I441 Atrioventricular block, second degree: Secondary | ICD-10-CM | POA: Diagnosis not present

## 2018-04-24 DIAGNOSIS — M5116 Intervertebral disc disorders with radiculopathy, lumbar region: Secondary | ICD-10-CM | POA: Diagnosis not present

## 2018-04-24 MED FILL — Lidocaine HCl Local Inj 1%: INTRAMUSCULAR | Qty: 60 | Status: AC

## 2018-04-24 NOTE — Telephone Encounter (Signed)
Verbal orders given for below.  

## 2018-04-24 NOTE — Telephone Encounter (Signed)
Copied from [place] [phone]. Topic: Quick Communication - See Telephone Encounter >> Apr 24, 2018  2:46 PM Synthia Innocent wrote: CRM for notification. See Telephone encounter for: 04/24/18. [facility] is requesting to extend nursing orders, 1x a week for 2 weeks, also add bath aid 2x a week for 2 weeks

## 2018-04-24 NOTE — Telephone Encounter (Signed)
Please verbally ok those orders 

## 2018-04-26 ENCOUNTER — Telehealth: Payer: Self-pay

## 2018-04-26 DIAGNOSIS — E1165 Type 2 diabetes mellitus with hyperglycemia: Secondary | ICD-10-CM | POA: Diagnosis not present

## 2018-04-26 DIAGNOSIS — Z87891 Personal history of nicotine dependence: Secondary | ICD-10-CM | POA: Diagnosis not present

## 2018-04-26 DIAGNOSIS — I110 Hypertensive heart disease with heart failure: Secondary | ICD-10-CM | POA: Diagnosis not present

## 2018-04-26 DIAGNOSIS — Z791 Long term (current) use of non-steroidal anti-inflammatories (NSAID): Secondary | ICD-10-CM | POA: Diagnosis not present

## 2018-04-26 DIAGNOSIS — Z955 Presence of coronary angioplasty implant and graft: Secondary | ICD-10-CM | POA: Diagnosis not present

## 2018-04-26 DIAGNOSIS — Z794 Long term (current) use of insulin: Secondary | ICD-10-CM | POA: Diagnosis not present

## 2018-04-26 DIAGNOSIS — I441 Atrioventricular block, second degree: Secondary | ICD-10-CM | POA: Diagnosis not present

## 2018-04-26 DIAGNOSIS — G8929 Other chronic pain: Secondary | ICD-10-CM | POA: Diagnosis not present

## 2018-04-26 DIAGNOSIS — M545 Low back pain: Secondary | ICD-10-CM | POA: Diagnosis not present

## 2018-04-26 DIAGNOSIS — I701 Atherosclerosis of renal artery: Secondary | ICD-10-CM | POA: Diagnosis not present

## 2018-04-26 DIAGNOSIS — L409 Psoriasis, unspecified: Secondary | ICD-10-CM | POA: Diagnosis not present

## 2018-04-26 DIAGNOSIS — I4891 Unspecified atrial fibrillation: Secondary | ICD-10-CM | POA: Diagnosis not present

## 2018-04-26 DIAGNOSIS — K219 Gastro-esophageal reflux disease without esophagitis: Secondary | ICD-10-CM | POA: Diagnosis not present

## 2018-04-26 DIAGNOSIS — I350 Nonrheumatic aortic (valve) stenosis: Secondary | ICD-10-CM | POA: Diagnosis not present

## 2018-04-26 DIAGNOSIS — I5033 Acute on chronic diastolic (congestive) heart failure: Secondary | ICD-10-CM | POA: Diagnosis not present

## 2018-04-26 DIAGNOSIS — J449 Chronic obstructive pulmonary disease, unspecified: Secondary | ICD-10-CM | POA: Diagnosis not present

## 2018-04-26 DIAGNOSIS — E785 Hyperlipidemia, unspecified: Secondary | ICD-10-CM | POA: Diagnosis not present

## 2018-04-26 DIAGNOSIS — I6522 Occlusion and stenosis of left carotid artery: Secondary | ICD-10-CM | POA: Diagnosis not present

## 2018-04-26 DIAGNOSIS — Z951 Presence of aortocoronary bypass graft: Secondary | ICD-10-CM | POA: Diagnosis not present

## 2018-04-26 DIAGNOSIS — I2510 Atherosclerotic heart disease of native coronary artery without angina pectoris: Secondary | ICD-10-CM | POA: Diagnosis not present

## 2018-04-26 DIAGNOSIS — I483 Typical atrial flutter: Secondary | ICD-10-CM | POA: Diagnosis not present

## 2018-04-26 DIAGNOSIS — M5116 Intervertebral disc disorders with radiculopathy, lumbar region: Secondary | ICD-10-CM | POA: Diagnosis not present

## 2018-04-26 NOTE — Telephone Encounter (Signed)
Copied from [place] [phone]. Topic: Inquiry >> Apr 26, 2018  3:54 PM Oliver Pila B wrote: Reason for CRM: Merry Proud from [facility] called to get an extension on PT contact [phone]

## 2018-04-27 NOTE — Telephone Encounter (Signed)
Jeff advised. 

## 2018-04-27 NOTE — Telephone Encounter (Signed)
Please give the order.  Thanks.   

## 2018-04-28 DIAGNOSIS — I4891 Unspecified atrial fibrillation: Secondary | ICD-10-CM | POA: Diagnosis not present

## 2018-04-28 DIAGNOSIS — I483 Typical atrial flutter: Secondary | ICD-10-CM | POA: Diagnosis not present

## 2018-04-28 DIAGNOSIS — J449 Chronic obstructive pulmonary disease, unspecified: Secondary | ICD-10-CM | POA: Diagnosis not present

## 2018-04-28 DIAGNOSIS — I110 Hypertensive heart disease with heart failure: Secondary | ICD-10-CM | POA: Diagnosis not present

## 2018-04-28 DIAGNOSIS — E1165 Type 2 diabetes mellitus with hyperglycemia: Secondary | ICD-10-CM | POA: Diagnosis not present

## 2018-04-28 DIAGNOSIS — Z791 Long term (current) use of non-steroidal anti-inflammatories (NSAID): Secondary | ICD-10-CM | POA: Diagnosis not present

## 2018-04-28 DIAGNOSIS — Z794 Long term (current) use of insulin: Secondary | ICD-10-CM | POA: Diagnosis not present

## 2018-04-28 DIAGNOSIS — Z955 Presence of coronary angioplasty implant and graft: Secondary | ICD-10-CM | POA: Diagnosis not present

## 2018-04-28 DIAGNOSIS — G8929 Other chronic pain: Secondary | ICD-10-CM | POA: Diagnosis not present

## 2018-04-28 DIAGNOSIS — I5033 Acute on chronic diastolic (congestive) heart failure: Secondary | ICD-10-CM | POA: Diagnosis not present

## 2018-04-28 DIAGNOSIS — M5116 Intervertebral disc disorders with radiculopathy, lumbar region: Secondary | ICD-10-CM | POA: Diagnosis not present

## 2018-04-28 DIAGNOSIS — I6522 Occlusion and stenosis of left carotid artery: Secondary | ICD-10-CM | POA: Diagnosis not present

## 2018-04-28 DIAGNOSIS — Z951 Presence of aortocoronary bypass graft: Secondary | ICD-10-CM | POA: Diagnosis not present

## 2018-04-28 DIAGNOSIS — M545 Low back pain: Secondary | ICD-10-CM | POA: Diagnosis not present

## 2018-04-28 DIAGNOSIS — I350 Nonrheumatic aortic (valve) stenosis: Secondary | ICD-10-CM | POA: Diagnosis not present

## 2018-04-28 DIAGNOSIS — K219 Gastro-esophageal reflux disease without esophagitis: Secondary | ICD-10-CM | POA: Diagnosis not present

## 2018-04-28 DIAGNOSIS — I2510 Atherosclerotic heart disease of native coronary artery without angina pectoris: Secondary | ICD-10-CM | POA: Diagnosis not present

## 2018-04-28 DIAGNOSIS — I701 Atherosclerosis of renal artery: Secondary | ICD-10-CM | POA: Diagnosis not present

## 2018-04-28 DIAGNOSIS — E785 Hyperlipidemia, unspecified: Secondary | ICD-10-CM | POA: Diagnosis not present

## 2018-04-28 DIAGNOSIS — L409 Psoriasis, unspecified: Secondary | ICD-10-CM | POA: Diagnosis not present

## 2018-04-28 DIAGNOSIS — Z87891 Personal history of nicotine dependence: Secondary | ICD-10-CM | POA: Diagnosis not present

## 2018-04-28 DIAGNOSIS — I441 Atrioventricular block, second degree: Secondary | ICD-10-CM | POA: Diagnosis not present

## 2018-05-01 ENCOUNTER — Other Ambulatory Visit: Payer: Self-pay | Admitting: Family Medicine

## 2018-05-01 DIAGNOSIS — E1165 Type 2 diabetes mellitus with hyperglycemia: Secondary | ICD-10-CM | POA: Diagnosis not present

## 2018-05-01 DIAGNOSIS — Z794 Long term (current) use of insulin: Secondary | ICD-10-CM | POA: Diagnosis not present

## 2018-05-01 DIAGNOSIS — Z951 Presence of aortocoronary bypass graft: Secondary | ICD-10-CM | POA: Diagnosis not present

## 2018-05-01 DIAGNOSIS — M5116 Intervertebral disc disorders with radiculopathy, lumbar region: Secondary | ICD-10-CM | POA: Diagnosis not present

## 2018-05-01 DIAGNOSIS — G8929 Other chronic pain: Secondary | ICD-10-CM | POA: Diagnosis not present

## 2018-05-01 DIAGNOSIS — Z87891 Personal history of nicotine dependence: Secondary | ICD-10-CM | POA: Diagnosis not present

## 2018-05-01 DIAGNOSIS — I441 Atrioventricular block, second degree: Secondary | ICD-10-CM | POA: Diagnosis not present

## 2018-05-01 DIAGNOSIS — I4891 Unspecified atrial fibrillation: Secondary | ICD-10-CM | POA: Diagnosis not present

## 2018-05-01 DIAGNOSIS — Z791 Long term (current) use of non-steroidal anti-inflammatories (NSAID): Secondary | ICD-10-CM | POA: Diagnosis not present

## 2018-05-01 DIAGNOSIS — I110 Hypertensive heart disease with heart failure: Secondary | ICD-10-CM | POA: Diagnosis not present

## 2018-05-01 DIAGNOSIS — I701 Atherosclerosis of renal artery: Secondary | ICD-10-CM | POA: Diagnosis not present

## 2018-05-01 DIAGNOSIS — Z955 Presence of coronary angioplasty implant and graft: Secondary | ICD-10-CM | POA: Diagnosis not present

## 2018-05-01 DIAGNOSIS — K219 Gastro-esophageal reflux disease without esophagitis: Secondary | ICD-10-CM | POA: Diagnosis not present

## 2018-05-01 DIAGNOSIS — M545 Low back pain: Secondary | ICD-10-CM | POA: Diagnosis not present

## 2018-05-01 DIAGNOSIS — L409 Psoriasis, unspecified: Secondary | ICD-10-CM | POA: Diagnosis not present

## 2018-05-01 DIAGNOSIS — I2510 Atherosclerotic heart disease of native coronary artery without angina pectoris: Secondary | ICD-10-CM | POA: Diagnosis not present

## 2018-05-01 DIAGNOSIS — I5033 Acute on chronic diastolic (congestive) heart failure: Secondary | ICD-10-CM | POA: Diagnosis not present

## 2018-05-01 DIAGNOSIS — I483 Typical atrial flutter: Secondary | ICD-10-CM | POA: Diagnosis not present

## 2018-05-01 DIAGNOSIS — I350 Nonrheumatic aortic (valve) stenosis: Secondary | ICD-10-CM | POA: Diagnosis not present

## 2018-05-01 DIAGNOSIS — I6522 Occlusion and stenosis of left carotid artery: Secondary | ICD-10-CM | POA: Diagnosis not present

## 2018-05-01 DIAGNOSIS — J449 Chronic obstructive pulmonary disease, unspecified: Secondary | ICD-10-CM | POA: Diagnosis not present

## 2018-05-01 DIAGNOSIS — E785 Hyperlipidemia, unspecified: Secondary | ICD-10-CM | POA: Diagnosis not present

## 2018-05-02 ENCOUNTER — Ambulatory Visit (INDEPENDENT_AMBULATORY_CARE_PROVIDER_SITE_OTHER): Payer: Medicare Other | Admitting: *Deleted

## 2018-05-02 ENCOUNTER — Other Ambulatory Visit: Payer: Self-pay

## 2018-05-02 ENCOUNTER — Telehealth: Payer: Self-pay

## 2018-05-02 VITALS — BP 104/68 | HR 75

## 2018-05-02 DIAGNOSIS — I441 Atrioventricular block, second degree: Secondary | ICD-10-CM

## 2018-05-02 DIAGNOSIS — Z950 Presence of cardiac pacemaker: Secondary | ICD-10-CM

## 2018-05-02 LAB — CUP PACEART INCLINIC DEVICE CHECK
Battery Remaining Longevity: 129 mo
Brady Statistic RA Percent Paced: 0.45 %
Brady Statistic RV Percent Paced: 99.3 %
Implantable Lead Implant Date: 20190719
Implantable Lead Location: 753859
Implantable Lead Location: 753860
Implantable Lead Model: 5076
Implantable Lead Model: 5076
Lead Channel Pacing Threshold Amplitude: 0.5 V
Lead Channel Pacing Threshold Pulse Width: 0.5 ms
Lead Channel Sensing Intrinsic Amplitude: 12 mV
Lead Channel Sensing Intrinsic Amplitude: 5 mV
Lead Channel Setting Pacing Pulse Width: 0.5 ms
MDC IDC LEAD IMPLANT DT: 20190719
MDC IDC MSMT BATTERY VOLTAGE: 3.1 V
MDC IDC MSMT LEADCHNL RA IMPEDANCE VALUE: 487.5 Ohm
MDC IDC MSMT LEADCHNL RV IMPEDANCE VALUE: 575 Ohm
MDC IDC MSMT LEADCHNL RV PACING THRESHOLD AMPLITUDE: 0.5 V
MDC IDC MSMT LEADCHNL RV PACING THRESHOLD PULSEWIDTH: 0.5 ms
MDC IDC PG IMPLANT DT: 20190719
MDC IDC PG SERIAL: 9040268
MDC IDC SESS DTM: 20190730164817
MDC IDC SET LEADCHNL RA PACING AMPLITUDE: 1.375
MDC IDC SET LEADCHNL RV PACING AMPLITUDE: 0.75 V
MDC IDC SET LEADCHNL RV SENSING SENSITIVITY: 2 mV
Pulse Gen Model: 2272

## 2018-05-02 NOTE — Telephone Encounter (Signed)
-----   Message from Mechele Dawley, RN sent at 05/02/2018  2:59 PM EDT ----- I saw Mr. Hinkle for his PPM wound check today. Pacemaker functioning appropriately. Patient and his daughter-in-law, [name], are concerned about some intermittent dizziness he has been experiencing x1 week. Some dizziness with position changes, but occurs other times as well. No syncope. BP at home running low 938H systolic after morning meds, 104/68 here today. O2 sat 99% on RA, HR 75bpm. Taking all meds, including torsemide 20mg  BID, weight down to 200lb this morning on home scale.  [name] requests a call at [phone] with recommendations.  Thanks! Raquel Sarna

## 2018-05-02 NOTE — Telephone Encounter (Signed)
I spoke with Charles [name] the patients daughter and as per the message sent to [place] from [facility] RN.[name] She is reporting that the pt is experiencing "dizzy" spells. He has back down on his Demadex to 20mg  bid. BP has been a little low for him.. 104/68. His heart rate is 75. She says the dizziness is mostly when he rising from sitting or when he turns his head too fast. I offered to make her an appointment with Dr. [name] but she declined. She says she just got him home and feels he needs to rest. She is asking if he can back off on any other meds. I advised her that I will forward to Dr. Trinna Balloon and see if he recommends any changes. PT/daughter agree and wait for return call but will let us know if anything changes or worsens. Pt will also continue to drink fluids and eat well.

## 2018-05-02 NOTE — Progress Notes (Signed)
Wound check appointment. Steri-strips removed. Wound without redness or edema. Incision edges approximated, wound well healed. Normal device function. Thresholds, sensing, and impedances consistent with implant measurements. Auto capture programmed on for extra safety margin until 3 month visit. Histogram distribution appropriate for patient and level of activity. 4 mode switches (<1%)--AT per EGMs, +Eliquis, longest 8sec. No high ventricular rates noted. Patient educated about wound care, arm mobility, lifting restrictions, and Merlin monitor. ROV with SK/B in 3 months.  Patient and family concerned about intermittent dizziness x1 week. Occurs with position changes and sometimes with activity. Patient at dry weight per home scale, BP running low 990W systolic at home, 893/40 here, 02 sat 99% on RA. Message sent to [facility] triage pool for further assistance (primary cardiologist is Dr. [name] per notes). Encouraged patient to call with any new or worsening symptoms in the interim.

## 2018-05-02 NOTE — Patient Instructions (Signed)
A scheduler will contact you to move the 3 month follow-up appointment to our [place] office.  If you have any questions, please call the [facility] at [phone].

## 2018-05-03 ENCOUNTER — Telehealth: Payer: Self-pay | Admitting: Internal Medicine

## 2018-05-03 DIAGNOSIS — E785 Hyperlipidemia, unspecified: Secondary | ICD-10-CM | POA: Diagnosis not present

## 2018-05-03 DIAGNOSIS — I441 Atrioventricular block, second degree: Secondary | ICD-10-CM | POA: Diagnosis not present

## 2018-05-03 DIAGNOSIS — I4891 Unspecified atrial fibrillation: Secondary | ICD-10-CM | POA: Diagnosis not present

## 2018-05-03 DIAGNOSIS — E1165 Type 2 diabetes mellitus with hyperglycemia: Secondary | ICD-10-CM | POA: Diagnosis not present

## 2018-05-03 DIAGNOSIS — I5033 Acute on chronic diastolic (congestive) heart failure: Secondary | ICD-10-CM | POA: Diagnosis not present

## 2018-05-03 DIAGNOSIS — Z87891 Personal history of nicotine dependence: Secondary | ICD-10-CM | POA: Diagnosis not present

## 2018-05-03 DIAGNOSIS — I2510 Atherosclerotic heart disease of native coronary artery without angina pectoris: Secondary | ICD-10-CM | POA: Diagnosis not present

## 2018-05-03 DIAGNOSIS — L409 Psoriasis, unspecified: Secondary | ICD-10-CM | POA: Diagnosis not present

## 2018-05-03 DIAGNOSIS — I6522 Occlusion and stenosis of left carotid artery: Secondary | ICD-10-CM | POA: Diagnosis not present

## 2018-05-03 DIAGNOSIS — J449 Chronic obstructive pulmonary disease, unspecified: Secondary | ICD-10-CM | POA: Diagnosis not present

## 2018-05-03 DIAGNOSIS — I350 Nonrheumatic aortic (valve) stenosis: Secondary | ICD-10-CM | POA: Diagnosis not present

## 2018-05-03 DIAGNOSIS — M545 Low back pain: Secondary | ICD-10-CM | POA: Diagnosis not present

## 2018-05-03 DIAGNOSIS — Z951 Presence of aortocoronary bypass graft: Secondary | ICD-10-CM | POA: Diagnosis not present

## 2018-05-03 DIAGNOSIS — Z791 Long term (current) use of non-steroidal anti-inflammatories (NSAID): Secondary | ICD-10-CM | POA: Diagnosis not present

## 2018-05-03 DIAGNOSIS — Z955 Presence of coronary angioplasty implant and graft: Secondary | ICD-10-CM | POA: Diagnosis not present

## 2018-05-03 DIAGNOSIS — I110 Hypertensive heart disease with heart failure: Secondary | ICD-10-CM | POA: Diagnosis not present

## 2018-05-03 DIAGNOSIS — K219 Gastro-esophageal reflux disease without esophagitis: Secondary | ICD-10-CM | POA: Diagnosis not present

## 2018-05-03 DIAGNOSIS — Z794 Long term (current) use of insulin: Secondary | ICD-10-CM | POA: Diagnosis not present

## 2018-05-03 DIAGNOSIS — M5116 Intervertebral disc disorders with radiculopathy, lumbar region: Secondary | ICD-10-CM | POA: Diagnosis not present

## 2018-05-03 DIAGNOSIS — I483 Typical atrial flutter: Secondary | ICD-10-CM | POA: Diagnosis not present

## 2018-05-03 DIAGNOSIS — G8929 Other chronic pain: Secondary | ICD-10-CM | POA: Diagnosis not present

## 2018-05-03 DIAGNOSIS — I701 Atherosclerosis of renal artery: Secondary | ICD-10-CM | POA: Diagnosis not present

## 2018-05-03 NOTE — Telephone Encounter (Signed)
Lmov for patient to call back and schedule an appointment here with Dr Caryl Comes in Oct   Will try again at a later time

## 2018-05-03 NOTE — Telephone Encounter (Signed)
-----   Message from Mechele Dawley, RN sent at 05/02/2018  4:48 PM EDT ----- Regarding: PPM 91 day f/u Mr. Radin would like to move his 96 day post-implant f/u appt with Dr. Caryl Comes to [facility].  It doesn't look the schedule is open yet, so I wasn't sure who to send this to.  His appointment can be scheduled on or after 10/22, and his 11/14 [facility] appt can be canceled.  Thanks! Raquel Sarna

## 2018-05-04 DIAGNOSIS — I701 Atherosclerosis of renal artery: Secondary | ICD-10-CM | POA: Diagnosis not present

## 2018-05-04 DIAGNOSIS — Z791 Long term (current) use of non-steroidal anti-inflammatories (NSAID): Secondary | ICD-10-CM | POA: Diagnosis not present

## 2018-05-04 DIAGNOSIS — M5116 Intervertebral disc disorders with radiculopathy, lumbar region: Secondary | ICD-10-CM | POA: Diagnosis not present

## 2018-05-04 DIAGNOSIS — M545 Low back pain: Secondary | ICD-10-CM | POA: Diagnosis not present

## 2018-05-04 DIAGNOSIS — I483 Typical atrial flutter: Secondary | ICD-10-CM | POA: Diagnosis not present

## 2018-05-04 DIAGNOSIS — J449 Chronic obstructive pulmonary disease, unspecified: Secondary | ICD-10-CM | POA: Diagnosis not present

## 2018-05-04 DIAGNOSIS — I4891 Unspecified atrial fibrillation: Secondary | ICD-10-CM | POA: Diagnosis not present

## 2018-05-04 DIAGNOSIS — I2510 Atherosclerotic heart disease of native coronary artery without angina pectoris: Secondary | ICD-10-CM | POA: Diagnosis not present

## 2018-05-04 DIAGNOSIS — E785 Hyperlipidemia, unspecified: Secondary | ICD-10-CM | POA: Diagnosis not present

## 2018-05-04 DIAGNOSIS — I5033 Acute on chronic diastolic (congestive) heart failure: Secondary | ICD-10-CM | POA: Diagnosis not present

## 2018-05-04 DIAGNOSIS — Z955 Presence of coronary angioplasty implant and graft: Secondary | ICD-10-CM | POA: Diagnosis not present

## 2018-05-04 DIAGNOSIS — I110 Hypertensive heart disease with heart failure: Secondary | ICD-10-CM | POA: Diagnosis not present

## 2018-05-04 DIAGNOSIS — Z87891 Personal history of nicotine dependence: Secondary | ICD-10-CM | POA: Diagnosis not present

## 2018-05-04 DIAGNOSIS — I6522 Occlusion and stenosis of left carotid artery: Secondary | ICD-10-CM | POA: Diagnosis not present

## 2018-05-04 DIAGNOSIS — K219 Gastro-esophageal reflux disease without esophagitis: Secondary | ICD-10-CM | POA: Diagnosis not present

## 2018-05-04 DIAGNOSIS — L409 Psoriasis, unspecified: Secondary | ICD-10-CM | POA: Diagnosis not present

## 2018-05-04 DIAGNOSIS — I350 Nonrheumatic aortic (valve) stenosis: Secondary | ICD-10-CM | POA: Diagnosis not present

## 2018-05-04 DIAGNOSIS — I441 Atrioventricular block, second degree: Secondary | ICD-10-CM | POA: Diagnosis not present

## 2018-05-04 DIAGNOSIS — Z794 Long term (current) use of insulin: Secondary | ICD-10-CM | POA: Diagnosis not present

## 2018-05-04 DIAGNOSIS — Z951 Presence of aortocoronary bypass graft: Secondary | ICD-10-CM | POA: Diagnosis not present

## 2018-05-04 DIAGNOSIS — G8929 Other chronic pain: Secondary | ICD-10-CM | POA: Diagnosis not present

## 2018-05-04 DIAGNOSIS — E1165 Type 2 diabetes mellitus with hyperglycemia: Secondary | ICD-10-CM | POA: Diagnosis not present

## 2018-05-05 NOTE — Telephone Encounter (Signed)
Patient prefers [name] to make an appt.   [phone]. lmov

## 2018-05-08 DIAGNOSIS — I483 Typical atrial flutter: Secondary | ICD-10-CM

## 2018-05-08 DIAGNOSIS — E785 Hyperlipidemia, unspecified: Secondary | ICD-10-CM

## 2018-05-08 DIAGNOSIS — Z955 Presence of coronary angioplasty implant and graft: Secondary | ICD-10-CM

## 2018-05-08 DIAGNOSIS — Z794 Long term (current) use of insulin: Secondary | ICD-10-CM

## 2018-05-08 DIAGNOSIS — Z87891 Personal history of nicotine dependence: Secondary | ICD-10-CM

## 2018-05-08 DIAGNOSIS — L409 Psoriasis, unspecified: Secondary | ICD-10-CM

## 2018-05-08 DIAGNOSIS — E1165 Type 2 diabetes mellitus with hyperglycemia: Secondary | ICD-10-CM | POA: Diagnosis not present

## 2018-05-08 DIAGNOSIS — F329 Major depressive disorder, single episode, unspecified: Secondary | ICD-10-CM

## 2018-05-08 DIAGNOSIS — G8929 Other chronic pain: Secondary | ICD-10-CM

## 2018-05-08 DIAGNOSIS — I441 Atrioventricular block, second degree: Secondary | ICD-10-CM | POA: Diagnosis not present

## 2018-05-08 DIAGNOSIS — I701 Atherosclerosis of renal artery: Secondary | ICD-10-CM

## 2018-05-08 DIAGNOSIS — Z951 Presence of aortocoronary bypass graft: Secondary | ICD-10-CM

## 2018-05-08 DIAGNOSIS — Z791 Long term (current) use of non-steroidal anti-inflammatories (NSAID): Secondary | ICD-10-CM

## 2018-05-08 DIAGNOSIS — J449 Chronic obstructive pulmonary disease, unspecified: Secondary | ICD-10-CM

## 2018-05-08 DIAGNOSIS — I4891 Unspecified atrial fibrillation: Secondary | ICD-10-CM | POA: Diagnosis not present

## 2018-05-08 DIAGNOSIS — I6522 Occlusion and stenosis of left carotid artery: Secondary | ICD-10-CM

## 2018-05-08 DIAGNOSIS — K219 Gastro-esophageal reflux disease without esophagitis: Secondary | ICD-10-CM

## 2018-05-08 DIAGNOSIS — I2510 Atherosclerotic heart disease of native coronary artery without angina pectoris: Secondary | ICD-10-CM

## 2018-05-08 DIAGNOSIS — I350 Nonrheumatic aortic (valve) stenosis: Secondary | ICD-10-CM | POA: Diagnosis not present

## 2018-05-08 DIAGNOSIS — I110 Hypertensive heart disease with heart failure: Secondary | ICD-10-CM | POA: Diagnosis not present

## 2018-05-08 DIAGNOSIS — M545 Low back pain: Secondary | ICD-10-CM

## 2018-05-08 DIAGNOSIS — N3941 Urge incontinence: Secondary | ICD-10-CM

## 2018-05-08 DIAGNOSIS — M5116 Intervertebral disc disorders with radiculopathy, lumbar region: Secondary | ICD-10-CM

## 2018-05-08 DIAGNOSIS — I5033 Acute on chronic diastolic (congestive) heart failure: Secondary | ICD-10-CM | POA: Diagnosis not present

## 2018-05-09 ENCOUNTER — Ambulatory Visit: Payer: Medicare Other | Admitting: Physical Therapy

## 2018-05-09 ENCOUNTER — Encounter: Payer: Medicare Other | Admitting: Surgery

## 2018-05-10 DIAGNOSIS — I2510 Atherosclerotic heart disease of native coronary artery without angina pectoris: Secondary | ICD-10-CM | POA: Diagnosis not present

## 2018-05-10 DIAGNOSIS — I110 Hypertensive heart disease with heart failure: Secondary | ICD-10-CM | POA: Diagnosis not present

## 2018-05-10 DIAGNOSIS — Z87891 Personal history of nicotine dependence: Secondary | ICD-10-CM | POA: Diagnosis not present

## 2018-05-10 DIAGNOSIS — G8929 Other chronic pain: Secondary | ICD-10-CM | POA: Diagnosis not present

## 2018-05-10 DIAGNOSIS — Z791 Long term (current) use of non-steroidal anti-inflammatories (NSAID): Secondary | ICD-10-CM | POA: Diagnosis not present

## 2018-05-10 DIAGNOSIS — I4891 Unspecified atrial fibrillation: Secondary | ICD-10-CM | POA: Diagnosis not present

## 2018-05-10 DIAGNOSIS — I441 Atrioventricular block, second degree: Secondary | ICD-10-CM | POA: Diagnosis not present

## 2018-05-10 DIAGNOSIS — I5033 Acute on chronic diastolic (congestive) heart failure: Secondary | ICD-10-CM | POA: Diagnosis not present

## 2018-05-10 DIAGNOSIS — J449 Chronic obstructive pulmonary disease, unspecified: Secondary | ICD-10-CM | POA: Diagnosis not present

## 2018-05-10 DIAGNOSIS — I483 Typical atrial flutter: Secondary | ICD-10-CM | POA: Diagnosis not present

## 2018-05-10 DIAGNOSIS — I701 Atherosclerosis of renal artery: Secondary | ICD-10-CM | POA: Diagnosis not present

## 2018-05-10 DIAGNOSIS — I6522 Occlusion and stenosis of left carotid artery: Secondary | ICD-10-CM | POA: Diagnosis not present

## 2018-05-10 DIAGNOSIS — E785 Hyperlipidemia, unspecified: Secondary | ICD-10-CM | POA: Diagnosis not present

## 2018-05-10 DIAGNOSIS — M5116 Intervertebral disc disorders with radiculopathy, lumbar region: Secondary | ICD-10-CM | POA: Diagnosis not present

## 2018-05-10 DIAGNOSIS — L409 Psoriasis, unspecified: Secondary | ICD-10-CM | POA: Diagnosis not present

## 2018-05-10 DIAGNOSIS — Z955 Presence of coronary angioplasty implant and graft: Secondary | ICD-10-CM | POA: Diagnosis not present

## 2018-05-10 DIAGNOSIS — E1165 Type 2 diabetes mellitus with hyperglycemia: Secondary | ICD-10-CM | POA: Diagnosis not present

## 2018-05-10 DIAGNOSIS — I350 Nonrheumatic aortic (valve) stenosis: Secondary | ICD-10-CM | POA: Diagnosis not present

## 2018-05-10 DIAGNOSIS — K219 Gastro-esophageal reflux disease without esophagitis: Secondary | ICD-10-CM | POA: Diagnosis not present

## 2018-05-10 DIAGNOSIS — Z951 Presence of aortocoronary bypass graft: Secondary | ICD-10-CM | POA: Diagnosis not present

## 2018-05-10 DIAGNOSIS — Z794 Long term (current) use of insulin: Secondary | ICD-10-CM | POA: Diagnosis not present

## 2018-05-10 DIAGNOSIS — M545 Low back pain: Secondary | ICD-10-CM | POA: Diagnosis not present

## 2018-05-10 NOTE — Telephone Encounter (Signed)
Lmov for Charles [name] to call and schedule appointment

## 2018-05-11 DIAGNOSIS — L409 Psoriasis, unspecified: Secondary | ICD-10-CM | POA: Diagnosis not present

## 2018-05-11 DIAGNOSIS — I2510 Atherosclerotic heart disease of native coronary artery without angina pectoris: Secondary | ICD-10-CM | POA: Diagnosis not present

## 2018-05-11 DIAGNOSIS — E1165 Type 2 diabetes mellitus with hyperglycemia: Secondary | ICD-10-CM | POA: Diagnosis not present

## 2018-05-11 DIAGNOSIS — I110 Hypertensive heart disease with heart failure: Secondary | ICD-10-CM | POA: Diagnosis not present

## 2018-05-11 DIAGNOSIS — Z87891 Personal history of nicotine dependence: Secondary | ICD-10-CM | POA: Diagnosis not present

## 2018-05-11 DIAGNOSIS — M5116 Intervertebral disc disorders with radiculopathy, lumbar region: Secondary | ICD-10-CM | POA: Diagnosis not present

## 2018-05-11 DIAGNOSIS — E785 Hyperlipidemia, unspecified: Secondary | ICD-10-CM | POA: Diagnosis not present

## 2018-05-11 DIAGNOSIS — I483 Typical atrial flutter: Secondary | ICD-10-CM | POA: Diagnosis not present

## 2018-05-11 DIAGNOSIS — G8929 Other chronic pain: Secondary | ICD-10-CM | POA: Diagnosis not present

## 2018-05-11 DIAGNOSIS — K219 Gastro-esophageal reflux disease without esophagitis: Secondary | ICD-10-CM | POA: Diagnosis not present

## 2018-05-11 DIAGNOSIS — I6522 Occlusion and stenosis of left carotid artery: Secondary | ICD-10-CM | POA: Diagnosis not present

## 2018-05-11 DIAGNOSIS — J449 Chronic obstructive pulmonary disease, unspecified: Secondary | ICD-10-CM | POA: Diagnosis not present

## 2018-05-11 DIAGNOSIS — Z951 Presence of aortocoronary bypass graft: Secondary | ICD-10-CM | POA: Diagnosis not present

## 2018-05-11 DIAGNOSIS — I4891 Unspecified atrial fibrillation: Secondary | ICD-10-CM | POA: Diagnosis not present

## 2018-05-11 DIAGNOSIS — M545 Low back pain: Secondary | ICD-10-CM | POA: Diagnosis not present

## 2018-05-11 DIAGNOSIS — I701 Atherosclerosis of renal artery: Secondary | ICD-10-CM | POA: Diagnosis not present

## 2018-05-11 DIAGNOSIS — I5033 Acute on chronic diastolic (congestive) heart failure: Secondary | ICD-10-CM | POA: Diagnosis not present

## 2018-05-11 DIAGNOSIS — Z794 Long term (current) use of insulin: Secondary | ICD-10-CM | POA: Diagnosis not present

## 2018-05-11 DIAGNOSIS — Z791 Long term (current) use of non-steroidal anti-inflammatories (NSAID): Secondary | ICD-10-CM | POA: Diagnosis not present

## 2018-05-11 DIAGNOSIS — I350 Nonrheumatic aortic (valve) stenosis: Secondary | ICD-10-CM | POA: Diagnosis not present

## 2018-05-11 DIAGNOSIS — Z955 Presence of coronary angioplasty implant and graft: Secondary | ICD-10-CM | POA: Diagnosis not present

## 2018-05-11 DIAGNOSIS — I441 Atrioventricular block, second degree: Secondary | ICD-10-CM | POA: Diagnosis not present

## 2018-05-16 NOTE — Telephone Encounter (Signed)
lmov to schedule appt  °

## 2018-05-18 ENCOUNTER — Other Ambulatory Visit: Payer: Self-pay

## 2018-05-18 MED ORDER — TORSEMIDE 20 MG PO TABS: 40.0000 mg | ORAL_TABLET | Freq: Two times a day (BID) | ORAL | 3 refills | Status: DC

## 2018-05-18 MED ORDER — TORSEMIDE 20 MG PO TABS: ORAL_TABLET | ORAL | 3 refills | Status: DC

## 2018-05-22 ENCOUNTER — Telehealth: Payer: Self-pay

## 2018-05-22 NOTE — Telephone Encounter (Signed)
Charles [name] at Charles [name] contacted me that Charles [name]'s family member cancelled apt with Charles [name] and stated the pt does not want to proceed with TAVR evaluation. I contacted Charles [name] who provides transportation for the pt's apts and she said the pt remains uncertain about having TAVR and states that he is old and slowing down and does not want to have anything done at this time.  She said the pt thought about it over the weekend and contacted her this morning to cancel his apt with Charles [name]. I advised her to contact the office at anytime if the pt would like to discuss TAVR or has additional questions.  The pt has a pending apt with Charles [name] in November and I made her aware that the pt needs to continue routine cardiology follow-up with Charles [name] too.  At this time I will remove the pt from TAVR work up list.

## 2018-05-23 ENCOUNTER — Encounter: Payer: Self-pay | Admitting: Internal Medicine

## 2018-05-23 NOTE — Telephone Encounter (Signed)
Thanks for letting me know!

## 2018-05-23 NOTE — Telephone Encounter (Signed)
Sent letter

## 2018-05-24 ENCOUNTER — Ambulatory Visit: Payer: Medicare Other | Admitting: Physical Therapy

## 2018-05-24 ENCOUNTER — Encounter: Payer: Medicare Other | Admitting: Surgery

## 2018-05-30 ENCOUNTER — Telehealth: Payer: Self-pay | Admitting: Cardiovascular Disease

## 2018-05-30 NOTE — Telephone Encounter (Signed)
Tried calling patient to schedule Dr. Kennon Holter 4 month followup, but, phone is constantly busy.  Claiborne Rigg who does the transportation for the patient can't get in touch with the patient as kept on hold.

## 2018-06-06 ENCOUNTER — Ambulatory Visit: Payer: Medicare Other | Admitting: Family Medicine

## 2018-06-06 ENCOUNTER — Other Ambulatory Visit: Payer: Self-pay | Admitting: Family Medicine

## 2018-06-12 ENCOUNTER — Other Ambulatory Visit: Payer: Self-pay | Admitting: Family Medicine

## 2018-07-10 ENCOUNTER — Other Ambulatory Visit: Payer: Self-pay | Admitting: Family Medicine

## 2018-07-10 MED ORDER — GLUCOSE BLOOD VI STRP: 1.0000 | ORAL_STRIP | 0 refills | Status: DC | PRN

## 2018-07-17 ENCOUNTER — Other Ambulatory Visit: Payer: Self-pay | Admitting: Family Medicine

## 2018-07-17 NOTE — Telephone Encounter (Signed)
Electronic refill request Trazodone Last office visit 02/15/18 Last refill 01/23/18 #180

## 2018-07-24 ENCOUNTER — Other Ambulatory Visit: Payer: Self-pay | Admitting: Family Medicine

## 2018-07-25 NOTE — Telephone Encounter (Signed)
Electronic refill request Eliquis Last office visit 02/15/18 Last refill 04/25/18 #60/6 Patient sees cardiology

## 2018-07-31 NOTE — Progress Notes (Addendum)
BP 122/68 (BP Location: Left Arm, Patient Position: Sitting, Cuff Size: Normal)   Pulse 93   Temp 97.6 F (36.4 C) (Oral)   Ht 6\' 1"  (1.854 m)   Wt 207 lb (93.9 kg)   SpO2 94%   BMI 27.31 kg/m    CC: med refill visit Subjective:    Patient ID: Charles [name], male    DOB: 07-27-1932, 84 y.o.   MRN: [id number]  HPI: Charles [name] is a 84 y.o. male presenting on 08/01/2018 for Medication Refill (Here for citalopram f/u. Pt accompanied by his daughter-in-law. )   Known second degree AV block, severe AS, recurrent typical atrial flutter, CAD s/p bypass, chronic diastolic CHF regularly sees Dr Caryl Comes EP. Has pacemaker in place.   Recent hospitalization 04/2018 with aflutter, discussed TAVR eval with cardiothoracic surgery, pt decided against this.  Advised to call cards for f/u appt with Dr [name].  Here for med refill visit - continue struggling with depression after missing his wife. Feels he's oversleeping.   He did receive flu shot this year.   DM - taking metformin 1000mg  BID. Not using lantus at night - checks sugars 2-3 times daily. Sugars ranging 130-170s.  Lab Results  Component Value Date   HGBA1C 9.5 (A) 08/01/2018    Diabetic Foot Exam - Simple   Simple Foot Form Diabetic Foot exam was performed with the following findings:  Yes 08/01/2018  8:18 AM  Visual Inspection No deformities, no ulcerations, no other skin breakdown bilaterally:  Yes Sensation Testing Intact to touch and monofilament testing bilaterally:  Yes Pulse Check Posterior Tibialis and Dorsalis pulse intact bilaterally:  Yes Comments    Lab Results  Component Value Date   MICROALBUR 5.6 (H) 10/08/2014     Relevant past medical, surgical, family and social history reviewed and updated as indicated. Interim medical history since our last visit reviewed. Allergies and medications reviewed and updated. Outpatient Medications Prior to Visit  Medication Sig Dispense Refill  . acetaminophen  (TYLENOL) 500 MG tablet Take 1 tablet (500 mg total) by mouth 3 (three) times daily. (Patient taking differently: Take 500 mg by mouth daily. )    . ELIQUIS 5 MG TABS tablet Take 1 tablet (5 mg total) by mouth 2 (two) times daily. 60 tablet 0  . finasteride (PROSCAR) 5 MG tablet Take 1 tablet (5 mg total) by mouth daily. 30 tablet 3  . glucose blood (ACCU-CHEK AVIVA PLUS) test strip 1 each by Other route as needed for other. Use as instructed to check blood sugar 3 (three) times a day.  Dx code:  E11.8, E11.65 300 each 0  . LANTUS 100 UNIT/ML injection Inject 0.35 mLs (35 Units total) into the skin at bedtime. 10 mL 3  . lisinopril (PRINIVIL,ZESTRIL) 40 MG tablet Take 1 tablet (40 mg total) by mouth daily. 90 tablet 2  . metFORMIN (GLUCOPHAGE) 1000 MG tablet Take 1 tablet (1,000 mg total) by mouth 2 (two) times daily with a meal. 180 tablet 1  . pantoprazole (PROTONIX) 20 MG tablet Take 1 tablet by mouth daily.    . Potassium Chloride ER 20 MEQ TBCR Take 20 mEq by mouth every Monday, Wednesday, Friday, Saturday, and Sunday at 6 PM. 30 tablet 1  . saw palmetto 160 MG capsule Take 320 mg by mouth daily.     . tamsulosin (FLOMAX) 0.4 MG CAPS capsule Take 1 capsule (0.4 mg total) by mouth daily after supper. 30 capsule 6  . torsemide (DEMADEX)  20 MG tablet Take 40 mg by mouth twice daily if weight>205 lb; OTHERWISE Take 20 mg by mouth twice daily if weight is 200-205 lb on home weight scale. 120 tablet 3  . traZODone (DESYREL) 50 MG tablet Take 2 tablets (100 mg total) by mouth at bedtime. 180 tablet 0  . atorvastatin (LIPITOR) 20 MG tablet TAKE 1 TABLET DAILY. 90 tablet 0  . citalopram (CELEXA) 20 MG tablet Take 1 tablet (20 mg total) by mouth daily. 90 tablet 0  . nitroGLYCERIN (NITROSTAT) 0.4 MG SL tablet Place 1 tablet (0.4 mg total) under the tongue every 5 (five) minutes as needed for chest pain. 90 tablet 3   No facility-administered medications prior to visit.      Per HPI unless specifically  indicated in ROS section below Review of Systems     Objective:    BP 122/68 (BP Location: Left Arm, Patient Position: Sitting, Cuff Size: Normal)   Pulse 93   Temp 97.6 F (36.4 C) (Oral)   Ht 6\' 1"  (1.854 m)   Wt 207 lb (93.9 kg)   SpO2 94%   BMI 27.31 kg/m   Wt Readings from Last 3 Encounters:  08/01/18 207 lb (93.9 kg)  04/21/18 204 lb (92.5 kg)  04/11/18 205 lb 9.6 oz (93.3 kg)    Physical Exam  Constitutional: He appears well-developed and well-nourished. No distress.  HENT:  Head: Normocephalic and atraumatic.  Mouth/Throat: Oropharynx is clear and moist. No oropharyngeal exudate.  Eyes: Pupils are equal, round, and reactive to light. Conjunctivae and EOM are normal. No scleral icterus.  Neck: Normal range of motion. Neck supple.  Cardiovascular: Normal rate, regular rhythm and intact distal pulses.  Murmur (4/6 systolic) heard. Pulmonary/Chest: Effort normal and breath sounds normal. No respiratory distress. He has no wheezes. He has no rales.  Musculoskeletal: He exhibits no edema.  See HPI for foot exam if done  Lymphadenopathy:    He has no cervical adenopathy.  Skin: Skin is warm and dry. No rash noted.  Psychiatric: He has a normal mood and affect.  Nursing note and vitals reviewed.     Assessment & Plan:   Problem List Items Addressed This Visit    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 [employer] Jude   MDD (major depressive disorder), recurrent episode, moderate (HCC)    Chronic, stable period. Continue celexa 20mg  daily. I suggested he try lower trazodone dose to 50mg  nightly - as he endorses some daytime somnolence       Relevant Medications   citalopram (CELEXA) 20 MG tablet   HYPERCHOLESTEROLEMIA    Due for FLP however not fasting today - will check next visit if not done by cards sooner.       Relevant Medications   atorvastatin (LIPITOR) 20 MG tablet   Essential hypertension    Chronic, stable. Continue current regimen.       Relevant  Medications   atorvastatin (LIPITOR) 20 MG tablet   Diabetes mellitus type 2, uncontrolled, with complications (HCC) - Primary    Chronic, overall stable. Pt compliant with metformin but not really using lantus (prescribed at 35u nightly). Will update A1c then determine need for lantus.       Relevant Medications   atorvastatin (LIPITOR) 20 MG tablet   Other Relevant Orders   POCT glycosylated hemoglobin (Hb A1C) (Completed)   AV block, 2nd degree   Relevant Medications   atorvastatin (LIPITOR) 20 MG tablet   Atrial flutter (HCC)   Relevant  Medications   atorvastatin (LIPITOR) 20 MG tablet   Aortic stenosis, severe   Relevant Medications   atorvastatin (LIPITOR) 20 MG tablet       Meds ordered this encounter  Medications  . citalopram (CELEXA) 20 MG tablet    Sig: Take 1 tablet (20 mg total) by mouth daily.    Dispense:  90 tablet    Refill:  3  . atorvastatin (LIPITOR) 20 MG tablet    Sig: Take 1 tablet (20 mg total) by mouth daily.    Dispense:  90 tablet    Refill:  3   Orders Placed This Encounter  Procedures  . POCT glycosylated hemoglobin (Hb A1C)    Follow up plan: Return in about 4 months (around 12/02/2018) for annual exam, prior fasting for blood work, medicare wellness visit.  [name], MD

## 2018-08-01 ENCOUNTER — Ambulatory Visit (INDEPENDENT_AMBULATORY_CARE_PROVIDER_SITE_OTHER): Payer: Medicare Other | Admitting: Family Medicine

## 2018-08-01 ENCOUNTER — Encounter: Payer: Self-pay | Admitting: Family Medicine

## 2018-08-01 VITALS — BP 122/68 | HR 93 | Temp 97.6°F | Ht 73.0 in | Wt 207.0 lb

## 2018-08-01 DIAGNOSIS — E118 Type 2 diabetes mellitus with unspecified complications: Secondary | ICD-10-CM

## 2018-08-01 DIAGNOSIS — I350 Nonrheumatic aortic (valve) stenosis: Secondary | ICD-10-CM

## 2018-08-01 DIAGNOSIS — F331 Major depressive disorder, recurrent, moderate: Secondary | ICD-10-CM

## 2018-08-01 DIAGNOSIS — IMO0002 Reserved for concepts with insufficient information to code with codable children: Secondary | ICD-10-CM

## 2018-08-01 DIAGNOSIS — I483 Typical atrial flutter: Secondary | ICD-10-CM

## 2018-08-01 DIAGNOSIS — Z950 Presence of cardiac pacemaker: Secondary | ICD-10-CM | POA: Diagnosis not present

## 2018-08-01 DIAGNOSIS — I10 Essential (primary) hypertension: Secondary | ICD-10-CM

## 2018-08-01 DIAGNOSIS — E7800 Pure hypercholesterolemia, unspecified: Secondary | ICD-10-CM | POA: Diagnosis not present

## 2018-08-01 DIAGNOSIS — E1165 Type 2 diabetes mellitus with hyperglycemia: Secondary | ICD-10-CM

## 2018-08-01 DIAGNOSIS — I441 Atrioventricular block, second degree: Secondary | ICD-10-CM

## 2018-08-01 LAB — POCT GLYCOSYLATED HEMOGLOBIN (HGB A1C): Hemoglobin A1C: 9.5 % — AB (ref 4.0–5.6)

## 2018-08-01 MED ORDER — CITALOPRAM HYDROBROMIDE 20 MG PO TABS: 20.0000 mg | ORAL_TABLET | Freq: Every day | ORAL | 3 refills | Status: DC

## 2018-08-01 MED ORDER — ATORVASTATIN CALCIUM 20 MG PO TABS: 20.0000 mg | ORAL_TABLET | Freq: Every day | ORAL | 3 refills | Status: DC

## 2018-08-01 NOTE — Assessment & Plan Note (Signed)
Chronic, stable period. Continue celexa 20mg  daily. I suggested he try lower trazodone dose to 50mg  nightly - as he endorses some daytime somnolence

## 2018-08-01 NOTE — Patient Instructions (Addendum)
Call Dr Kennon Holter office to see if you're due for appointment.  Celexa refilled.  If feeling over sleep, may try taking only 1 trazodone at bedtime - update me with effect.  A1c today.  Good to see you today, call us with questions.  Return in 4 mo wellness visit/physical.

## 2018-08-01 NOTE — Assessment & Plan Note (Signed)
Chronic, overall stable. Pt compliant with metformin but not really using lantus (prescribed at 35u nightly). Will update A1c then determine need for lantus.

## 2018-08-01 NOTE — Assessment & Plan Note (Signed)
Due for FLP however not fasting today - will check next visit if not done by cards sooner.

## 2018-08-01 NOTE — Assessment & Plan Note (Signed)
Chronic, stable. Continue current regimen. 

## 2018-08-05 ENCOUNTER — Other Ambulatory Visit: Payer: Self-pay | Admitting: Family Medicine

## 2018-08-05 MED ORDER — INSULIN GLARGINE 100 UNIT/ML ~~LOC~~ SOLN: 20.0000 [IU] | Freq: Every day | SUBCUTANEOUS | 3 refills | Status: DC

## 2018-08-10 ENCOUNTER — Other Ambulatory Visit (HOSPITAL_COMMUNITY): Payer: Medicare Other

## 2018-08-17 ENCOUNTER — Ambulatory Visit (INDEPENDENT_AMBULATORY_CARE_PROVIDER_SITE_OTHER): Payer: Medicare Other | Admitting: Internal Medicine

## 2018-08-17 ENCOUNTER — Encounter: Payer: Medicare Other | Admitting: Internal Medicine

## 2018-08-17 ENCOUNTER — Encounter: Payer: Self-pay | Admitting: Internal Medicine

## 2018-08-17 VITALS — BP 118/80 | HR 86 | Ht 73.0 in | Wt 211.6 lb

## 2018-08-17 DIAGNOSIS — I2510 Atherosclerotic heart disease of native coronary artery without angina pectoris: Secondary | ICD-10-CM | POA: Diagnosis not present

## 2018-08-17 DIAGNOSIS — Z950 Presence of cardiac pacemaker: Secondary | ICD-10-CM

## 2018-08-17 DIAGNOSIS — I441 Atrioventricular block, second degree: Secondary | ICD-10-CM | POA: Diagnosis not present

## 2018-08-17 DIAGNOSIS — I350 Nonrheumatic aortic (valve) stenosis: Secondary | ICD-10-CM

## 2018-08-17 DIAGNOSIS — I5022 Chronic systolic (congestive) heart failure: Secondary | ICD-10-CM | POA: Diagnosis not present

## 2018-08-17 NOTE — Patient Instructions (Signed)
Medication Instructions:  Your physician recommends that you continue on your current medications as directed. Please refer to the Current Medication list given to you today.  Labwork: None ordered.  Testing/Procedures: Your physician has requested that you have an echocardiogram. Echocardiography is a painless test that uses sound waves to create images of your heart. It provides your doctor with information about the size and shape of your heart and how well your heart's chambers and valves are working. This procedure takes approximately one hour. There are no restrictions for this procedure.  Please schedule for [place] location   Follow-Up: Your physician recommends that you schedule a follow-up appointment in:   Please schedule a follow up visit with Dr [name], first available.  Follow up with Dr Caryl Comes in 9 months with Dr Caryl Comes in [place]  Remote monitoring is used to monitor your Pacemaker from home. This monitoring reduces the number of office visits required to check your device to one time per year. It allows [place] to keep an eye on the functioning of your device to ensure it is working properly. You are scheduled for a device check from home on 2/13. You may send your transmission at any time that day. If you have a wireless device, the transmission will be sent automatically. After your physician reviews your transmission, you will receive a postcard with your next transmission date.      Any Other Special Instructions Will Be Listed Below (If Applicable).     If you need a refill on your cardiac medications before your next appointment, please call your pharmacy.

## 2018-08-17 NOTE — Progress Notes (Signed)
Patient Care Team: [name], MD as PCP - General (Family Medicine) [name], MD as PCP - Cardiology (Cardiology) [name], MD as Consulting Physician (Cardiology)   HPI  Charles [name] is a 84 y.o. male  Seen in followup for pacemaker for symptomatic second-degree AV block   He has declined TAVR  Has hx of recurrent atrial flutter w DCCV x 2 (2016,18)  Typical he is treated with Eliquis.  No interval AFlutter   He has a history of coronary artery disease with prior bypass surgery.  As noted below, 1/19, grafts were patent.  He is much improved with less dyspnea.     DATE TEST EF   1/18 Echo  30-35%   1/19 LHC   Patent LIMA/ SVG 3/3  6/19 Echo  60-65%  LVH-severe AS Severe/ MS mild/LAE mod      Past Medical History:  Diagnosis Date  . Abdominal aortic atherosclerosis ([place])    by xray  . Aortic stenosis 06/2014   moderate-severe  . Arthritis    in lower back (06/24/2015)  . Atrial flutter ([place])    notes 06/24/2015  . AV block, [place] 1 09/06/2017   Archie Endo 09/06/2017  . 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 CAD (coronary artery disease) 2004   a. s/p CABG in 2004 with LIMA-LAD, SVG-RCA, SVG-OM, and SVG-RI  . Carotid stenosis 1999   s/p L CEA  . CHF (congestive heart failure) ([place]) dx'd 06/2015   a. EF 55-60% by echo in 2016. b. 10/2016: EF reduced to 30-35% by TEE in the setting of atrial fibrillation with RVR  . Colon polyp 2005   (Dr. Tiffany Kocher)  . Compression fracture of L1 lumbar vertebra (HCC) remote  . COPD (chronic obstructive pulmonary disease) ([place]) 03/2011   by xray  . Depression    hx  . Ex-smoker   . GERD (gastroesophageal reflux disease) 2003   h/o duodenal ulcer per EGD as well as esophagitis  . History of colon polyps 2003, 2005   adenomatous Vira Agar)  . HLD (hyperlipidemia)   . HTN (hypertension)   . Psoriasis   . Renal artery stenosis ([place]) 2004   70% bilateral, followed  by cards  . T2DM (type 2 diabetes mellitus) ([place]) 1995  . Urge incontinence of urine     Past Surgical History:  Procedure Laterality Date  . CARDIAC CATHETERIZATION  03/06/2003   No intervention - recommend CABG  . CARDIOVASCULAR STRESS TEST  11/2010   normal perfusion, no evidence of ischemia, EF 62% post exercise  . CARDIOVASCULAR STRESS TEST  11/28/2012   Mild diaphragmatic attenuation; cannot exclude a focal region of nontransmural inferior scar  . CARDIOVERSION N/A 06/25/2015   Procedure: CARDIOVERSION;  Surgeon: Pixie Casino, MD;  Location: [facility] ENDOSCOPY;  Service: Cardiovascular;  Laterality: N/A;  . CARDIOVERSION N/A 11/02/2016   Procedure: CARDIOVERSION;  Surgeon: [name], MD;  Location: [place];  Service: Cardiovascular;  Laterality: N/A;  . CAROTID ENDARTERECTOMY Left 1999   ([place])  . CATARACT EXTRACTION W/ INTRAOCULAR LENS  IMPLANT, BILATERAL Bilateral 01/2013   [place]  . COLONOSCOPY  2003   colon polyp x3 - adenomatous Tiffany Kocher)  . COLONOSCOPY  10/08/2012   2 TA, diverticulosis, int hem, no rpt rec Tiffany Kocher)  . CORONARY ANGIOPLASTY    . CORONARY ARTERY BYPASS GRAFT  03/07/2003   4v CABG ([place]) with LIMA to LAD, vein graft to RCA, 1st obtuse marginal,  and ramus intermedius  . ESOPHAGOGASTRODUODENOSCOPY  10/08/2012   nl esophagus, duodenitis and erosive gastropathy, path - gastropathy no Hpylori, no rpt rec  . LUMBAR EPIDURAL INJECTION  03/2017   L5/S1 ([place])  . PACEMAKER IMPLANT N/A 04/21/2018   Procedure: PACEMAKER IMPLANT -- Dual Chamber;  Surgeon: Deboraha Sprang, MD;  Location: [place] CV LAB;  Service: Cardiovascular;  Laterality: N/A;  . PERIPHERAL VASCULAR CATHETERIZATION N/A 02/02/2016   Procedure: Renal Angiography;  Surgeon: [name], MD;  Location: [place] CV LAB;  Service: Cardiovascular;  Laterality: N/A;  . PERIPHERAL VASCULAR CATHETERIZATION N/A 02/02/2016   Procedure: Abdominal Aortogram;  Surgeon: [name], MD;  Location: [place] CV LAB;  Service: Cardiovascular;  Laterality: N/A;  . RENAL DOPPLER  11/29/2011   Celiac&SMA-demonstrated vessel narrowing suggestive of a greater than 50% diameter reduction. Bilateral renal arteries-demonstrated vessel narrowing of 60-99% diameter reduction. Rt Kidney-mid pole lateral simple cyst noted measuring 1.29x0.76x1.11cm and exophytic cyst outside lower pole measuring 1.23x0.96x1.31. Lft Kidney-lateral mid to lower pole simple cyst measuering-1.24x9.83x1.24  . RIGHT/LEFT HEART CATH AND CORONARY/GRAFT ANGIOGRAPHY N/A 10/20/2017   Procedure: RIGHT/LEFT HEART CATH AND CORONARY/GRAFT ANGIOGRAPHY;  Surgeon: Burnell Blanks, MD;  Location: [place] CV LAB;  Service: Cardiovascular;  Laterality: N/A;  . TEE WITHOUT CARDIOVERSION N/A 06/25/2015   Procedure: TRANSESOPHAGEAL ECHOCARDIOGRAM (TEE);  Surgeon: Pixie Casino, MD;  Location[facility] ENDOSCOPY;  Service: Cardiovascular;  Laterality: N/A;  . TEE WITHOUT CARDIOVERSION N/A 11/02/2016   Procedure: TRANSESOPHAGEAL ECHOCARDIOGRAM (TEE);  Surgeon: [name], MD;  Location: [facility] ENDOSCOPY;  Service: Cardiovascular;  Laterality: N/A;  . TONSILLECTOMY    . UPPER GASTROINTESTINAL ENDOSCOPY  2003   reflux esophagitis, erosive gastropathy, duodenal ulcer    Current Meds  Medication Sig  . acetaminophen (TYLENOL) 500 MG tablet Take 1 tablet (500 mg total) by mouth 3 (three) times daily.  [name] atorvastatin (LIPITOR) 20 MG tablet Take 1 tablet (20 mg total) by mouth daily.  . citalopram (CELEXA) 20 MG tablet Take 1 tablet (20 mg total) by mouth daily.  [name] ELIQUIS 5 MG TABS tablet Take 1 tablet (5 mg total) by mouth 2 (two) times daily.  . finasteride (PROSCAR) 5 MG tablet Take 1 tablet (5 mg total) by mouth daily.  [name] glucose blood (ACCU-CHEK AVIVA PLUS) test strip 1 each by Other route as needed for other. Use as instructed to check blood sugar 3 (three) times a day.  Dx code:  E11.8, E11.65  . insulin glargine (LANTUS) 100 UNIT/ML  injection Inject 0.2 mLs (20 Units total) into the skin at bedtime.  [name] lisinopril (PRINIVIL,ZESTRIL) 40 MG tablet Take 1 tablet (40 mg total) by mouth daily.  . metFORMIN (GLUCOPHAGE) 1000 MG tablet Take 1 tablet (1,000 mg total) by mouth 2 (two) times daily with a meal.  . nitroGLYCERIN (NITROSTAT) 0.4 MG SL tablet Place 1 tablet (0.4 mg total) under the tongue every 5 (five) minutes as needed for chest pain.  . pantoprazole (PROTONIX) 20 MG tablet Take 1 tablet by mouth daily.  . Potassium Chloride ER 20 MEQ TBCR Take 20 mEq by mouth every Monday, Wednesday, Friday, Saturday, and Sunday at 6 PM.  . saw palmetto 160 MG capsule Take 320 mg by mouth daily.   . tamsulosin (FLOMAX) 0.4 MG CAPS capsule Take 1 capsule (0.4 mg total) by mouth daily after supper.  . torsemide (DEMADEX) 20 MG tablet Take 40 mg by mouth twice daily if weight>205 lb; OTHERWISE Take 20 mg  by mouth twice daily if weight is 200-205 lb on home weight scale.  . traZODone (DESYREL) 50 MG tablet Take 50 mg by mouth at bedtime.    Allergies  Allergen Reactions  . Gabapentin Other (See Comments)    Leg pain, weakness   . Metoprolol Swelling  . Spironolactone Other (See Comments)    Painful gynecomastia  . Amlodipine Other (See Comments)    Edema  . Other Other (See Comments)    Horse serum - myalgias  . Rosiglitazone Maleate Other (See Comments)    REACTION: Did not help  . Tricor [Fenofibrate] Other (See Comments)    myalgias      Review of Systems negative except from HPI and PMH  Physical Exam BP 118/80   Pulse 86   Ht 6\' 1"  (1.854 m)   Wt 211 lb 9.6 oz (96 kg)   SpO2 93%   BMI 27.92 kg/m  Well developed and well nourished in no acute distress HENT normal E scleral and icterus clear Neck Supple JVP flat; carotids brisk and full Clear to ausculation Device pocket well healed; without hematoma or erythema.  There is no tethering  Regular rate and rhythm, 3/6 systolic m Soft with active bowel  sounds No clubbing cyanosis Trace Edema Alert and oriented, grossly normal motor and sensory function Skin Warm and Dry  ECG sinus rhythm at 86 with P synchronous pacing  Assessment and  Plan  High-grade heart block  Aortic stenosis  Ischemic heart disease with prior bypass  Pacemaker-St. Jude The patient's device was interrogated and the information was fully reviewed.  The device was reprogrammed to maximize longevity    Euvolemic continue current meds  Without symptoms of ischemia    Current medicines are reviewed at length with the patient today .  The patient does not  have concerns regarding medicines.

## 2018-08-18 LAB — CUP PACEART INCLINIC DEVICE CHECK
Battery Voltage: 3.04 V
Brady Statistic RA Percent Paced: 1.2 %
Brady Statistic RV Percent Paced: 99.31 %
Implantable Lead Implant Date: 20190719
Implantable Lead Implant Date: 20190719
Implantable Lead Location: 753860
Implantable Lead Model: 5076
Implantable Lead Model: 5076
Implantable Pulse Generator Implant Date: 20190719
Lead Channel Impedance Value: 512.5 Ohm
Lead Channel Pacing Threshold Amplitude: 0.375 V
Lead Channel Pacing Threshold Pulse Width: 0.5 ms
Lead Channel Pacing Threshold Pulse Width: 0.5 ms
Lead Channel Sensing Intrinsic Amplitude: 12 mV
Lead Channel Sensing Intrinsic Amplitude: 5 mV
Lead Channel Setting Pacing Amplitude: 1.375
MDC IDC LEAD LOCATION: 753859
MDC IDC MSMT BATTERY REMAINING LONGEVITY: 123 mo
MDC IDC MSMT LEADCHNL RA IMPEDANCE VALUE: 437.5 Ohm
MDC IDC MSMT LEADCHNL RV PACING THRESHOLD AMPLITUDE: 0.5 V
MDC IDC PG SERIAL: 9040268
MDC IDC SESS DTM: 20191114222231
MDC IDC SET LEADCHNL RV PACING AMPLITUDE: 0.75 V
MDC IDC SET LEADCHNL RV PACING PULSEWIDTH: 0.5 ms
MDC IDC SET LEADCHNL RV SENSING SENSITIVITY: 2 mV

## 2018-08-21 ENCOUNTER — Other Ambulatory Visit: Payer: Self-pay | Admitting: Family Medicine

## 2018-08-21 NOTE — Telephone Encounter (Signed)
Eliquis Last filled:  07/25/18, #60/0 Last OV:  08/01/18, f/u Next OV:  none

## 2018-08-22 ENCOUNTER — Encounter: Payer: Medicare Other | Admitting: Internal Medicine

## 2018-08-28 ENCOUNTER — Other Ambulatory Visit: Payer: Self-pay | Admitting: Family Medicine

## 2018-09-04 ENCOUNTER — Other Ambulatory Visit: Payer: Medicare Other

## 2018-09-05 ENCOUNTER — Ambulatory Visit (INDEPENDENT_AMBULATORY_CARE_PROVIDER_SITE_OTHER): Payer: Medicare Other

## 2018-09-05 ENCOUNTER — Other Ambulatory Visit: Payer: Self-pay

## 2018-09-05 DIAGNOSIS — I350 Nonrheumatic aortic (valve) stenosis: Secondary | ICD-10-CM

## 2018-09-06 ENCOUNTER — Other Ambulatory Visit: Payer: Self-pay | Admitting: *Deleted

## 2018-09-06 DIAGNOSIS — I350 Nonrheumatic aortic (valve) stenosis: Secondary | ICD-10-CM

## 2018-09-18 ENCOUNTER — Ambulatory Visit: Payer: Medicare Other

## 2018-09-19 ENCOUNTER — Other Ambulatory Visit: Payer: Self-pay | Admitting: Family Medicine

## 2018-09-19 NOTE — Telephone Encounter (Signed)
Electronic refill request Eliquis Last office visit 08/01/18 Last refill 08/23/18 #60 Cancelled appointment scheduled 09/21/18

## 2018-09-20 DIAGNOSIS — M5416 Radiculopathy, lumbar region: Secondary | ICD-10-CM | POA: Diagnosis not present

## 2018-09-21 ENCOUNTER — Encounter: Payer: Medicare Other | Admitting: Family Medicine

## 2018-09-22 ENCOUNTER — Ambulatory Visit: Payer: Medicare Other | Admitting: Cardiovascular Disease

## 2018-09-25 ENCOUNTER — Other Ambulatory Visit: Payer: Self-pay | Admitting: Family Medicine

## 2018-09-25 NOTE — Telephone Encounter (Signed)
Protonix is a historical medication okay to refill? Instructions?

## 2018-09-28 ENCOUNTER — Other Ambulatory Visit: Payer: Self-pay | Admitting: Family Medicine

## 2018-09-29 ENCOUNTER — Other Ambulatory Visit: Payer: Self-pay

## 2018-09-29 MED ORDER — INSULIN GLARGINE 100 UNIT/ML ~~LOC~~ SOLN: 20.0000 [IU] | Freq: Every day | SUBCUTANEOUS | 1 refills | Status: DC

## 2018-09-29 NOTE — Telephone Encounter (Signed)
E-scribed refill 

## 2018-10-10 DIAGNOSIS — M5136 Other intervertebral disc degeneration, lumbar region: Secondary | ICD-10-CM | POA: Diagnosis not present

## 2018-10-13 MED ORDER — POTASSIUM CHLORIDE ER 20 MEQ PO TBCR: 20.0000 meq | EXTENDED_RELEASE_TABLET | ORAL | 5 refills | Status: DC

## 2018-10-17 ENCOUNTER — Telehealth: Payer: Self-pay | Admitting: Cardiovascular Disease

## 2018-10-17 NOTE — Telephone Encounter (Signed)
Rqst routed to medical records.

## 2018-10-17 NOTE — Telephone Encounter (Signed)
New message    Charles [name] is followup on fax sent on fax sent on Friday. Looking to request ejection fraction, bp and hr. Please advise.  The fax # [phone].

## 2018-10-31 ENCOUNTER — Encounter: Payer: Self-pay | Admitting: Cardiovascular Disease

## 2018-10-31 ENCOUNTER — Ambulatory Visit: Payer: Medicare Other | Admitting: Cardiovascular Disease

## 2018-10-31 DIAGNOSIS — I2510 Atherosclerotic heart disease of native coronary artery without angina pectoris: Secondary | ICD-10-CM | POA: Diagnosis not present

## 2018-10-31 DIAGNOSIS — I483 Typical atrial flutter: Secondary | ICD-10-CM | POA: Diagnosis not present

## 2018-10-31 DIAGNOSIS — I5033 Acute on chronic diastolic (congestive) heart failure: Secondary | ICD-10-CM | POA: Diagnosis not present

## 2018-10-31 DIAGNOSIS — I10 Essential (primary) hypertension: Secondary | ICD-10-CM

## 2018-10-31 DIAGNOSIS — M5416 Radiculopathy, lumbar region: Secondary | ICD-10-CM | POA: Diagnosis not present

## 2018-10-31 DIAGNOSIS — E7800 Pure hypercholesterolemia, unspecified: Secondary | ICD-10-CM

## 2018-10-31 NOTE — Assessment & Plan Note (Signed)
History of essential hypertension her blood pressure measured today at 106/70.  He is on lisinopril.  Continue current meds at current dosing.

## 2018-10-31 NOTE — Progress Notes (Signed)
10/31/2018 Charles [name]   Aug 08, 1932  [id number]  Primary Physician [name], MD Primary Cardiologist: [name] MD Charles [name], [name]  HPI:  Charles [name] is a 85 y.o.  mildly overweight, married Caucasian male whose wife was also a patient of mine. She unfortunately passed away 12/23/17. He currently lives with one of his sons and appears to be fairly depressed. He was married for 51 years.He is a father of 60, grandfather to 4 grandchildren. I last saw him in the office4/18/18. He is accompanied by his son Charles [name] today.[name] He has a history of CAD status post coronary artery bypass grafting x4 June 2004 with a LIMA to his LAD; a vein to the right coronary artery, obtuse marginal branch and ramus branch. He also was found to have 70% bilateral renal artery stenosis which we have been following by duplex ultrasound. He has had left carotid endarterectomy which we follow as well. His other problems include hypertension, hyperlipidemia and noninsulin-requiring diabetes. He is asymptomatic. His last functional study performed 2/14 was nonischemic. he denies chest pain or shortness of breath. He had new onset atrial flutter back in the fall and underwent TEE guided DC cardioversion by Dr. Debara Pickett 06/25/15 successfully to sinus rhythm. He was placed on Eliquis oral anticoagulation but has ultimately decided not to take this for various reasons.recent renal Dopplers have suggested progression of disease bilaterally left greater than right. His blood pressures have been more difficult to control as well. In addition, his echo performed 06/13/15 was significant for moderate to severe aortic stenosis with a valve area of 0.9 cm2 and a peak gradient of 49 mmHg. since I saw him in the office 5 months ago he's noticed increasing shortness of breath and fatigue over the last 3 weeks. He saw his PCP who did an EKG demonstrating recurrent atrial flutter with slow ventricular response.He underwent  successful outpatient TEE guided cardioversion by Dr. Oval Linsey on 11/02/16 back to sinus rhythm with one shock. He remains on oral anticoagulation. He is currently improved and is currently asymptomatic.  He was evaluated by Dr. Burt Knack for T aVR 10/14/17 and underwent right and left heart cath by Dr. Angelena Form  a week later revealing patent grafts with at most moderate aortic stenosis.  Since I saw him in March of last year he has had progressive lifestyle limiting shortness of breath from severe aortic stenosis.  He also had a permanent pacemaker placed by Dr. Caryl Comes 04/21/2018.  We did rediscuss the possibility of TAVR which he is thinking about.   Current Meds  Medication Sig  . acetaminophen (TYLENOL) 500 MG tablet Take 1 tablet (500 mg total) by mouth 3 (three) times daily.  [name] atorvastatin (LIPITOR) 20 MG tablet Take 1 tablet (20 mg total) by mouth daily.  . citalopram (CELEXA) 20 MG tablet Take 1 tablet (20 mg total) by mouth daily.  [name] ELIQUIS 5 MG TABS tablet Take 1 tablet (5 mg total) by mouth 2 (two) times daily.  . finasteride (PROSCAR) 5 MG tablet Take 1 tablet (5 mg total) by mouth daily.  [name] glucose blood (ACCU-CHEK AVIVA PLUS) test strip 1 each by Other route as needed for other. Use as instructed to check blood sugar 3 (three) times a day.  Dx code:  E11.8, E11.65  . insulin glargine (LANTUS) 100 UNIT/ML injection Inject 0.2 mLs (20 Units total) into the skin at bedtime.  [name] lisinopril (PRINIVIL,ZESTRIL) 40 MG tablet Take 1 tablet (40 mg total)  by mouth daily.  . metFORMIN (GLUCOPHAGE) 1000 MG tablet Take 1 tablet (1,000 mg total) by mouth 2 (two) times daily with a meal.  . nitroGLYCERIN (NITROSTAT) 0.4 MG SL tablet Place 1 tablet (0.4 mg total) under the tongue every 5 (five) minutes as needed for chest pain.  . pantoprazole (PROTONIX) 20 MG tablet Take 1 tablet (20 mg total) by mouth daily.  . Potassium Chloride ER 20 MEQ TBCR Take 20 mEq by mouth every Monday, Wednesday, Friday,  Saturday, and Sunday at 6 PM.  . saw palmetto 160 MG capsule Take 320 mg by mouth daily.   . tamsulosin (FLOMAX) 0.4 MG CAPS capsule Take 1 capsule (0.4 mg total) by mouth daily after supper.  . torsemide (DEMADEX) 20 MG tablet Take 40 mg by mouth twice daily if weight>205 lb; OTHERWISE Take 20 mg by mouth twice daily if weight is 200-205 lb on home weight scale.  . traZODone (DESYREL) 50 MG tablet Take 50 mg by mouth at bedtime.     Allergies  Allergen Reactions  . Gabapentin Other (See Comments)    Leg pain, weakness   . Metoprolol Swelling  . Spironolactone Other (See Comments)    Painful gynecomastia  . Amlodipine Other (See Comments)    Edema  . Other Other (See Comments)    Horse serum - myalgias  . Rosiglitazone Maleate Other (See Comments)    REACTION: Did not help  . Tricor [Fenofibrate] Other (See Comments)    myalgias    Social History   Socioeconomic History  . Marital status: Widowed    Spouse name: Not on file  . Number of children: Not on file  . Years of education: Not on file  . Highest education level: Not on file  Occupational History  . Not on file  Social Needs  . Financial resource strain: Not on file  . Food insecurity:    Worry: Not on file    Inability: Not on file  . Transportation needs:    Medical: Not on file    Non-medical: Not on file  Tobacco Use  . Smoking status: Former Smoker    Packs/day: 0.50    Years: 36.00    Pack years: 18.00    Types: Cigarettes    Last attempt to quit: 07/23/2013    Years since quitting: 5.2  . Smokeless tobacco: Never Used  Substance and Sexual Activity  . Alcohol use: No  . Drug use: No  . Sexual activity: Not Currently  Lifestyle  . Physical activity:    Days per week: Not on file    Minutes per session: Not on file  . Stress: Not on file  Relationships  . Social connections:    Talks on phone: Not on file    Gets together: Not on file    Attends religious service: Not on file    Active  member of club or organization: Not on file    Attends meetings of clubs or organizations: Not on file    Relationship status: Not on file  . Intimate partner violence:    Fear of current or ex partner: Not on file    Emotionally abused: Not on file    Physically abused: Not on file    Forced sexual activity: Not on file  Other Topics Concern  . Not on file  Social History Narrative   Caffeine: 4 cups coffee/day, some tea and soda   Lives with wife   Occupation: Retired  Activity: no regular exercise   Diet: good water, fruits/vegetables daily     Review of Systems: General: negative for chills, fever, night sweats or weight changes.  Cardiovascular: negative for chest pain, dyspnea on exertion, edema, orthopnea, palpitations, paroxysmal nocturnal dyspnea or shortness of breath Dermatological: negative for rash Respiratory: negative for cough or wheezing Urologic: negative for hematuria Abdominal: negative for nausea, vomiting, diarrhea, bright red blood per rectum, melena, or hematemesis Neurologic: negative for visual changes, syncope, or dizziness All other systems reviewed and are otherwise negative except as noted above.    Blood pressure 106/70, pulse 92, height 6\' 1"  (1.854 m), weight 220 lb (99.8 kg).  General appearance: alert and no distress Neck: no adenopathy, no carotid bruit, no JVD, supple, symmetrical, trachea midline and thyroid not enlarged, symmetric, no tenderness/mass/nodules Lungs: clear to auscultation bilaterally Heart: 2/6 outflow tract murmur consistent with aortic stenosis Extremities: extremities normal, atraumatic, no cyanosis or edema Pulses: 2+ and symmetric Skin: Skin color, texture, turgor normal. No rashes or lesions Neurologic: Alert and oriented X 3, normal strength and tone. Normal symmetric reflexes. Normal coordination and gait  EKG not performed today  ASSESSMENT AND PLAN:   HYPERCHOLESTEROLEMIA History of hyperlipidemia on statin  therapy followed by his PCP  Essential hypertension History of essential hypertension her blood pressure measured today at 106/70.  He is on lisinopril.  Continue current meds at current dosing.  Coronary atherosclerosis History of CAD status post coronary artery bypass grafting x4 in June 2004 with a LIMA to his LAD, vein to the RCA, obtuse marginal branch and ramus branch.  He had a heart cath performed by Dr. Angelena Form 10/14/2017 for evaluation of TAVR revealing patent grafts with moderate aortic stenosis.  He ultimately decided not to pursue TAVR.  He is severely limited with dyspnea on exertion.  Typical atrial flutter (HCC) History of atrial flutter status post cardioversion times 11/2014 2018 with ultimate implantation of a permanent transvenous pacemaker by Dr. Caryl Comes 04/21/2018.  Acute on chronic diastolic CHF (congestive heart failure) (HCC) History of diastolic heart failure probably related to severe aortic stenosis on torsemide      [name] MD [facility] 10/31/2018 5:21 PM

## 2018-10-31 NOTE — Assessment & Plan Note (Signed)
History of CAD status post coronary artery bypass grafting x4 in June 2004 with a LIMA to his LAD, vein to the RCA, obtuse marginal branch and ramus branch.  He had a heart cath performed by Dr. Angelena Form 10/14/2017 for evaluation of TAVR revealing patent grafts with moderate aortic stenosis.  He ultimately decided not to pursue TAVR.  He is severely limited with dyspnea on exertion.

## 2018-10-31 NOTE — Assessment & Plan Note (Signed)
History of diastolic heart failure probably related to severe aortic stenosis on torsemide

## 2018-10-31 NOTE — Patient Instructions (Signed)
Medication Instructions:  NONE If you need a refill on your cardiac medications before your next appointment, please call your pharmacy.   Lab work: NONE If you have labs (blood work) drawn today and your tests are completely normal, you will receive your results only by: [name] MyChart Message (if you have MyChart) OR . A paper copy in the mail If you have any lab test that is abnormal or we need to change your treatment, we will call you to review the results.  Testing/Procedures: NONE  Follow-Up: At [facility], you and your health needs are our priority.  As part of our continuing mission to provide you with exceptional heart care, we have created designated Provider Care Teams.  These Care Teams include your primary Cardiologist (physician) and Advanced Practice Providers (APPs -  Physician Assistants and Nurse Practitioners) who all work together to provide you with the care you need, when you need it. . You will need a follow up appointment in 6 months WITH AN APP AND 12 MONTHS WITH DR. [name].  Please call our office 2 months in advance to schedule [facility] appointment.  You may see Dr. [name] or one of the following Advanced Practice Providers on your designated Care Team:   . Kerin Ransom, [place] . Almyra Deforest, PA-C . Fabian Sharp, PA-C . Jory Sims, DNP . Rosaria Ferries, PA-C . Roby Lofts, PA-C . Sande Rives, PA-C

## 2018-10-31 NOTE — Assessment & Plan Note (Signed)
History of hyperlipidemia on statin therapy followed by his PCP 

## 2018-10-31 NOTE — Assessment & Plan Note (Signed)
History of atrial flutter status post cardioversion times 11/2014 2018 with ultimate implantation of a permanent transvenous pacemaker by Dr. Caryl Comes 04/21/2018.

## 2018-11-06 ENCOUNTER — Encounter: Payer: Self-pay | Admitting: Cardiovascular Disease

## 2018-11-06 ENCOUNTER — Ambulatory Visit: Payer: Medicare Other | Admitting: Cardiovascular Disease

## 2018-11-06 VITALS — BP 110/82 | HR 80 | Ht 73.0 in | Wt 217.4 lb

## 2018-11-06 DIAGNOSIS — I350 Nonrheumatic aortic (valve) stenosis: Secondary | ICD-10-CM | POA: Diagnosis not present

## 2018-11-06 DIAGNOSIS — I5022 Chronic systolic (congestive) heart failure: Secondary | ICD-10-CM | POA: Diagnosis not present

## 2018-11-06 NOTE — Patient Instructions (Signed)
Medication Instructions:  Your provider recommends that you continue on your current medications as directed. Please refer to the Current Medication list given to you today.    Labwork: None  Testing/Procedures: None  Follow-Up: You will be called to arrange an appointment with the surgeon!

## 2018-11-06 NOTE — Progress Notes (Signed)
Cardiology Office Note:    Date:  11/06/2018   ID:  TEVAN MARIAN, DOB May 04, 1932, MRN [id number]  PCP:  [name], MD  Cardiologist:  [name], MD  Electrophysiologist:  None   Referring MD: [name], MD   Chief Complaint  Patient presents with  . Shortness of Breath    History of Present Illness:    Charles [name] is a 85 y.o. male with a hx of coronary artery disease, aortic stenosis, and paroxysmal atrial fibrillation, heart block status post permanent pacemaker, presenting for follow-up evaluation.  The patient underwent remote CABG in 2004 with a LIMA to LAD, vein graft RCA, vein graft OM, and vein graft to ramus intermedius.  Cardiac catheterization 1 year ago demonstrated continued patency of all of his bypass grafts.  Last year he was evaluated for severe aortic stenosis with recommendations to proceed with TAVR evaluation, but he declined at the time.  He returns today because of worsening shortness of breath and desire to move forward with TAVR.  I saw the patient last on March 30, 2018.  He returns today with his daughter.  When I last evaluated him, he was noted to have periods of marked bradycardia with Mobitz 1 block as well as 2: 1 AV block.  I referred him to Dr. Caryl Comes and he ultimately underwent permanent pacemaker placement April 21, 2018.  The patient has developed progressive symptoms of shortness of breath.  After initially deciding not to proceed with TAVR, his symptoms have progressed to a point where he has changed his mind and would like to move forward with treatment of his aortic stenosis.  He is short of breath with minimal activity such as walking 10 to 20 feet on level ground.  He denies orthopnea or PND.  He denies leg swelling, lightheadedness, syncope, or chest pain.    Past Medical History:  Diagnosis Date  . Abdominal aortic atherosclerosis ([place])    by xray  . Aortic stenosis 06/2014   moderate-severe  . Arthritis    in lower  back (06/24/2015)  . Atrial flutter ([place])    notes 06/24/2015  . AV block, Mobitz 1 09/06/2017   Archie Endo 09/06/2017  . 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 CAD (coronary artery disease) 2004   a. s/p CABG in 2004 with LIMA-LAD, SVG-RCA, SVG-OM, and SVG-RI  . Carotid stenosis 1999   s/p L CEA  . CHF (congestive heart failure) ([place]) dx'd 06/2015   a. EF 55-60% by echo in 2016. b. 10/2016: EF reduced to 30-35% by TEE in the setting of atrial fibrillation with RVR  . Colon polyp 2005   (Dr. Tiffany Kocher)  . Compression fracture of L1 lumbar vertebra ([facility]) remote  . COPD (chronic obstructive pulmonary disease) ([name]) 03/2011   by xray  . Depression    hx  . Ex-smoker   . GERD (gastroesophageal reflux disease) 2003   h/o duodenal ulcer per EGD as well as esophagitis  . History of colon polyps 2003, 2005   adenomatous Vira Agar)  . HLD (hyperlipidemia)   . HTN (hypertension)   . Psoriasis   . Renal artery stenosis ([place]) 2004   70% bilateral, followed by cards  . T2DM (type 2 diabetes mellitus) ([place]) 1995  . Urge incontinence of urine     Past Surgical History:  Procedure Laterality Date  . CARDIAC CATHETERIZATION  03/06/2003   No intervention - recommend CABG  . CARDIOVASCULAR STRESS TEST  11/2010  normal perfusion, no evidence of ischemia, EF 62% post exercise  . CARDIOVASCULAR STRESS TEST  11/28/2012   Mild diaphragmatic attenuation; cannot exclude a focal region of nontransmural inferior scar  . CARDIOVERSION N/A 06/25/2015   Procedure: CARDIOVERSION;  Surgeon: Pixie Casino, MD;  Location: [facility] ENDOSCOPY;  Service: Cardiovascular;  Laterality: N/A;  . CARDIOVERSION N/A 11/02/2016   Procedure: CARDIOVERSION;  Surgeon: [name], MD;  Location: [place];  Service: Cardiovascular;  Laterality: N/A;  . CAROTID ENDARTERECTOMY Left 1999   ([place])  . CATARACT EXTRACTION W/ INTRAOCULAR LENS  IMPLANT, BILATERAL Bilateral 01/2013   [place]  . COLONOSCOPY  2003    colon polyp x3 - adenomatous Tiffany Kocher)  . COLONOSCOPY  10/08/2012   2 TA, diverticulosis, int hem, no rpt rec Tiffany Kocher)  . CORONARY ANGIOPLASTY    . CORONARY ARTERY BYPASS GRAFT  03/07/2003   4v CABG ([place]) with LIMA to LAD, vein graft to RCA, 1st obtuse marginal, and ramus intermedius  . ESOPHAGOGASTRODUODENOSCOPY  10/08/2012   nl esophagus, duodenitis and erosive gastropathy, path - gastropathy no Hpylori, no rpt rec  . LUMBAR EPIDURAL INJECTION  03/2017   L5/S1 ([place])  . PACEMAKER IMPLANT N/A 04/21/2018   Procedure: PACEMAKER IMPLANT -- Dual Chamber;  Surgeon: Deboraha Sprang, MD;  Location: [place] CV [facility];  Service: Cardiovascular;  Laterality: N/A;  . PERIPHERAL VASCULAR CATHETERIZATION N/A 02/02/2016   Procedure: Renal Angiography;  Surgeon: [name], MD;  Location: [place] [facility];  Service: Cardiovascular;  Laterality: N/A;  . PERIPHERAL VASCULAR CATHETERIZATION N/A 02/02/2016   Procedure: Abdominal Aortogram;  Surgeon: [name], MD;  Location: [place] [facility];  Service: Cardiovascular;  Laterality: N/A;  . RENAL DOPPLER  11/29/2011   Celiac&SMA-demonstrated vessel narrowing suggestive of a greater than 50% diameter reduction. Bilateral renal arteries-demonstrated vessel narrowing of 60-99% diameter reduction. Rt Kidney-mid pole lateral simple cyst noted measuring 1.29x0.76x1.11cm and exophytic cyst outside lower pole measuring 1.23x0.96x1.31. Lft Kidney-lateral mid to lower pole simple cyst measuering-1.24x9.83x1.24  . RIGHT/LEFT HEART CATH AND CORONARY/GRAFT ANGIOGRAPHY N/A 10/20/2017   Procedure: RIGHT/LEFT HEART CATH AND CORONARY/GRAFT ANGIOGRAPHY;  Surgeon: Burnell Blanks, MD;  Location: [place] [facility];  Service: Cardiovascular;  Laterality: N/A;  . TEE WITHOUT CARDIOVERSION N/A 06/25/2015   Procedure: TRANSESOPHAGEAL ECHOCARDIOGRAM (TEE);  Surgeon: Pixie Casino, MD;  Location: [facility] ENDOSCOPY;  Service: Cardiovascular;  Laterality: N/A;  . TEE  WITHOUT CARDIOVERSION N/A 11/02/2016   Procedure: TRANSESOPHAGEAL ECHOCARDIOGRAM (TEE);  Surgeon: [name], MD;  Location[facility]COPY;  Service: Cardiovascular;  Laterality: N/A;  . TONSILLECTOMY    . UPPER GASTROINTESTINAL ENDOSCOPY  2003   reflux esophagitis, erosive gastropathy, duodenal ulcer    Current Medications: Current Meds  Medication Sig  . acetaminophen (TYLENOL) 500 MG tablet Take 1 tablet (500 mg total) by mouth 3 (three) times daily.  [name] atorvastatin (LIPITOR) 20 MG tablet Take 1 tablet (20 mg total) by mouth daily.  . citalopram (CELEXA) 20 MG tablet Take 1 tablet (20 mg total) by mouth daily.  [name] ELIQUIS 5 MG TABS tablet Take 1 tablet (5 mg total) by mouth 2 (two) times daily.  . finasteride (PROSCAR) 5 MG tablet Take 1 tablet (5 mg total) by mouth daily.  [name] glucose blood (ACCU-CHEK AVIVA PLUS) test strip 1 each by Other route as needed for other. Use as instructed to check blood sugar 3 (three) times a day.  Dx code:  E11.8, E11.65  . insulin glargine (LANTUS) 100 UNIT/ML injection  Inject 20 Units into the skin 2 (two) times daily.  [name] lisinopril (PRINIVIL,ZESTRIL) 40 MG tablet Take 1 tablet (40 mg total) by mouth daily.  . metFORMIN (GLUCOPHAGE) 1000 MG tablet Take 1 tablet (1,000 mg total) by mouth 2 (two) times daily with a meal.  . nitroGLYCERIN (NITROSTAT) 0.4 MG SL tablet Place 1 tablet (0.4 mg total) under the tongue every 5 (five) minutes as needed for chest pain.  . pantoprazole (PROTONIX) 20 MG tablet Take 1 tablet (20 mg total) by mouth daily.  . Potassium Chloride ER 20 MEQ TBCR Take 20 mEq by mouth every Monday, Wednesday, Friday, Saturday, and Sunday at 6 PM.  . tamsulosin (FLOMAX) 0.4 MG CAPS capsule Take 1 capsule (0.4 mg total) by mouth daily after supper.  . torsemide (DEMADEX) 20 MG tablet Take 40 mg by mouth twice daily if weight>205 lb; OTHERWISE Take 20 mg by mouth twice daily if weight is 200-205 lb on home weight scale.  . traZODone (DESYREL) 50  MG tablet Take 50 mg by mouth at bedtime.     Allergies:   Gabapentin; Metoprolol; Spironolactone; Amlodipine; Other; Rosiglitazone maleate; and Tricor [fenofibrate]   Social History   Socioeconomic History  . Marital status: Widowed    Spouse name: Not on file  . Number of children: Not on file  . Years of education: Not on file  . Highest education level: Not on file  Occupational History  . Not on file  Social Needs  . Financial resource strain: Not on file  . Food insecurity:    Worry: Not on file    Inability: Not on file  . Transportation needs:    Medical: Not on file    Non-medical: Not on file  Tobacco Use  . Smoking status: Former Smoker    Packs/day: 0.50    Years: 36.00    Pack years: 18.00    Types: Cigarettes    Last attempt to quit: 07/23/2013    Years since quitting: 5.2  . Smokeless tobacco: Never Used  Substance and Sexual Activity  . Alcohol use: No  . Drug use: No  . Sexual activity: Not Currently  Lifestyle  . Physical activity:    Days per week: Not on file    Minutes per session: Not on file  . Stress: Not on file  Relationships  . Social connections:    Talks on phone: Not on file    Gets together: Not on file    Attends religious service: Not on file    Active member of club or organization: Not on file    Attends meetings of clubs or organizations: Not on file    Relationship status: Not on file  Other Topics Concern  . Not on file  Social History Narrative   Caffeine: 4 cups coffee/day, some tea and soda   Lives with wife   Occupation: Retired   Activity: no regular exercise   Diet: good water, fruits/vegetables daily     Family History: The patient's family history includes Cancer in his brother, brother, maternal grandmother, and mother; Diabetes in his sister; Heart attack in his maternal grandfather, paternal grandfather, and paternal grandmother; Heart disease in his father and sister; Hypertension in his father and sister;  Lung disease in his brother; Stroke in his sister.  ROS:   Please see the history of present illness.    All other systems reviewed and are negative.  EKGs/Labs/Other Studies Reviewed:    The following studies were reviewed  today: 2D Echo 09-05-2018: Study Conclusions  - Procedure narrative: Transthoracic echocardiography. Image   quality was poor. The study was technically difficult, as a   result of poor acoustic windows. - Left ventricle: The cavity size was normal. Wall thickness was   increased in a pattern of moderate LVH. Systolic function was   normal. The estimated ejection fraction was in the range of 50%   to 55%. Doppler parameters are consistent with abnormal left   ventricular relaxation (grade 1 diastolic dysfunction). Doppler   parameters are consistent with high ventricular filling pressure. - Aortic valve: Cusp separation was reduced. Transvalvular velocity   was increased. There was moderate to severe stenosis. There was   trivial regurgitation. Peak velocity (S): 326 cm/s. Mean gradient   (S): 24 mm Hg. Valve area (VTI): 0.91 cm^2. - Mitral valve: Calcified annulus. Mildly thickened leaflets . The   findings are consistent with mild stenosis. There was mild to   moderate regurgitation. - Left atrium: The atrium was mildly dilated. - Right ventricle: The cavity size was mildly dilated. Systolic   function was mildly to moderately reduced. - Pulmonary arteries: Systolic pressure was within the normal   range, estimated to be 30 mm Hg.  Left ventricle:  The cavity size was normal. Wall thickness was increased in a pattern of moderate LVH. Systolic function was normal. The estimated ejection fraction was in the range of 50% to 55%. Images were inadequate for LV wall motion assessment. Doppler parameters are consistent with abnormal left ventricular relaxation (grade 1 diastolic dysfunction). Doppler parameters are consistent with high ventricular filling  pressure.  ------------------------------------------------------------------- Aortic valve:   Mildly thickened, moderately calcified leaflets. Cusp separation was reduced.  Doppler:  Transvalvular velocity was increased. There was moderate to severe stenosis. There was trivial regurgitation.    VTI ratio of LVOT to aortic valve: 0.26. Valve area (VTI): 0.91 cm^2. Indexed valve area (VTI): 0.41 cm^2/m^2. Mean velocity ratio of LVOT to aortic valve: 0.22. Valve area (Vmean): 0.75 cm^2. Indexed valve area (Vmean): 0.34 cm^2/m^2. Mean gradient (S): 24 mm Hg. Peak gradient (S): 43 mm Hg.  ------------------------------------------------------------------- Aorta:  Aortic root: The aortic root was upper normal in size to mildly dilated.  ------------------------------------------------------------------- Mitral valve:   Calcified annulus. Mildly thickened leaflets . Leaflet separation was normal.  Doppler:   The findings are consistent with mild stenosis.   There was mild to moderate regurgitation.    Valve area by pressure half-time: 3.49 cm^2. Indexed valve area by pressure half-time: 1.56 cm^2/m^2. Valve area by continuity equation (using LVOT flow): 1.35 cm^2. Indexed valve area by continuity equation (using LVOT flow): 0.6 cm^2/m^2. Mean gradient (D): 5 mm Hg. Peak gradient (D): 6 mm Hg.  ------------------------------------------------------------------- Left atrium:  The atrium was mildly dilated.  ------------------------------------------------------------------- Right ventricle:  The cavity size was mildly dilated. Pacer wire or catheter noted in right ventricle. Systolic function was mildly to moderately reduced.  ------------------------------------------------------------------- Pulmonic valve:   Poorly visualized.  Doppler:  Transvalvular velocity was within the normal range. There was no evidence for stenosis. There was no significant  regurgitation.  ------------------------------------------------------------------- Tricuspid valve:  Poorly visualized.  Doppler:  There was trivial regurgitation.  ------------------------------------------------------------------- Pulmonary artery:   Poorly visualized. Systolic pressure was within the normal range, estimated to be 30 mm Hg.  ------------------------------------------------------------------- Right atrium:  The atrium was at the upper limits of normal in size.  ------------------------------------------------------------------- Pericardium:  There was no pericardial effusion.  ------------------------------------------------------------------- Systemic veins: Inferior vena cava: The  vessel was normal in size. The respirophasic diameter changes were in the normal range (>= 50%), consistent with normal central venous pressure.  Cath 10-20-2018: Left ventricle:  The cavity size was normal. Wall thickness was increased in a pattern of moderate LVH. Systolic function was normal. The estimated ejection fraction was in the range of 50% to 55%. Images were inadequate for LV wall motion assessment. Doppler parameters are consistent with abnormal left ventricular relaxation (grade 1 diastolic dysfunction). Doppler parameters are consistent with high ventricular filling pressure.  ------------------------------------------------------------------- Aortic valve:   Mildly thickened, moderately calcified leaflets. Cusp separation was reduced.  Doppler:  Transvalvular velocity was increased. There was moderate to severe stenosis. There was trivial regurgitation.    VTI ratio of LVOT to aortic valve: 0.26. Valve area (VTI): 0.91 cm^2. Indexed valve area (VTI): 0.41 cm^2/m^2. Mean velocity ratio of LVOT to aortic valve: 0.22. Valve area (Vmean): 0.75 cm^2. Indexed valve area (Vmean): 0.34 cm^2/m^2. Mean gradient (S): 24 mm Hg. Peak gradient (S): 43 mm  Hg.  ------------------------------------------------------------------- Aorta:  Aortic root: The aortic root was upper normal in size to mildly dilated.  ------------------------------------------------------------------- Mitral valve:   Calcified annulus. Mildly thickened leaflets . Leaflet separation was normal.  Doppler:   The findings are consistent with mild stenosis.   There was mild to moderate regurgitation.    Valve area by pressure half-time: 3.49 cm^2. Indexed valve area by pressure half-time: 1.56 cm^2/m^2. Valve area by continuity equation (using LVOT flow): 1.35 cm^2. Indexed valve area by continuity equation (using LVOT flow): 0.6 cm^2/m^2. Mean gradient (D): 5 mm Hg. Peak gradient (D): 6 mm Hg.  ------------------------------------------------------------------- Left atrium:  The atrium was mildly dilated.  ------------------------------------------------------------------- Right ventricle:  The cavity size was mildly dilated. Pacer wire or catheter noted in right ventricle. Systolic function was mildly to moderately reduced.  ------------------------------------------------------------------- Pulmonic valve:   Poorly visualized.  Doppler:  Transvalvular velocity was within the normal range. There was no evidence for stenosis. There was no significant regurgitation.  ------------------------------------------------------------------- Tricuspid valve:  Poorly visualized.  Doppler:  There was trivial regurgitation.  ------------------------------------------------------------------- Pulmonary artery:   Poorly visualized. Systolic pressure was within the normal range, estimated to be 30 mm Hg.  ------------------------------------------------------------------- Right atrium:  The atrium was at the upper limits of normal in size.  ------------------------------------------------------------------- Pericardium:  There was no pericardial  effusion.  ------------------------------------------------------------------- Systemic veins: Inferior vena cava: The vessel was normal in size. The respirophasic diameter changes were in the normal range (>= 50%), consistent with normal central venous pressure.  Cardiac Cath 10-20-2017: Coronary Findings   Diagnostic  Dominance: Right  Left Anterior Descending  Ost LAD to Prox LAD lesion 99% stenosed  Ost LAD to Prox LAD lesion is 99% stenosed.  First Diagonal Branch  Ost 1st Diag lesion 50% stenosed  Ost 1st Diag lesion is 50% stenosed.  Left Circumflex  Ost Cx to Prox Cx lesion 99% stenosed  Ost Cx to Prox Cx lesion is 99% stenosed.  First Obtuse Marginal Branch  Vessel is moderate in size.  Ost 1st Mrg lesion 99% stenosed  Ost 1st Mrg lesion is 99% stenosed.  Second Obtuse Marginal Branch  Vessel is moderate in size.  Ost 2nd Mrg lesion 100% stenosed  Ost 2nd Mrg lesion is 100% stenosed.  Right Coronary Artery  Vessel is large.  Ost RCA to Prox RCA lesion 100% stenosed  Ost RCA to Prox RCA lesion is 100% stenosed. The lesion is chronically occluded.  saphenous Graft to RPDA  SVG graft was visualized by angiography and is normal in caliber. The graft exhibits minimal luminal irregularities.  saphenous Graft to 1st Mrg  SVG graft was visualized by angiography and is normal in caliber. The graft exhibits no disease.  saphenous Graft to 2nd Mrg  SVG graft was visualized by angiography and is normal in caliber. The graft exhibits no disease.  LIMA LIMA Graft to Mid LAD  LIMA graft was visualized by non-selective angiography and is normal in caliber. The graft exhibits no disease.  Intervention   No interventions have been documented.  Right Heart   Right Heart Pressures Hemodynamic findings consistent with mild pulmonary hypertension. Elevated LV EDP consistent with volume overload.  Coronary Diagrams   Diagnostic  Dominance: Right    CTA Heart  04-12-2018: FINDINGS: Aortic Valve: Trileaflet aortic valve with severely thickened and calcified leaflets and severely decreased leaflets opening. There are no calcifications extending into the LVOT.  Aorta: Normal size with diffuse atheroma and calcifications, no dissection.  Sinotubular Junction: 31 x 30 mm  Ascending Thoracic Aorta: 38 x 36 mm  Aortic Arch: 32 x 29 mm  Descending Thoracic Aorta: 30 x 29 mm  Sinus of Valsalva Measurements:  Non-coronary: 35 mm  Right -coronary: 32 mm  Left -coronary: 35 mm  Coronary Artery Height above Annulus:  Left Main: 14 mm  Right Coronary: 16 mm  Virtual Basal Annulus Measurements:  Maximum/Minimum Diameter: 27.9 x 20.1 mm  Mean Diameter: 23.5 mm  Perimeter: 76.8 mm  Area: 433 mm2  Optimum Fluoroscopic Angle for Delivery: LAO 5 CAU 5  IMPRESSION: 1. Trileaflet aortic valve with severely thickened and calcified leaflets and severely decreased leaflets opening. There are no calcifications extending into the LVOT. Annular measurements suitable for delivery of a 26 mm Edwards-SAPIEN 3 valve.  2. Sufficient coronary to annulus distance.  3. Optimum Fluoroscopic Angle for Delivery:  LAO 5 CAU 5  4. No thrombus in the left atrial appendage.  CTA Chest, Abd, Pelvis: FINDINGS: CTA CHEST FINDINGS  Cardiovascular: Heart size is mildly enlarged. There is no significant pericardial fluid, thickening or pericardial calcification. There is aortic atherosclerosis, as well as atherosclerosis of the great vessels of the mediastinum and the coronary arteries, including calcified atherosclerotic plaque in the left main, left anterior descending, left circumflex and right coronary arteries. Status post median sternotomy for CABG including LIMA to the LAD. Severe thickening calcification of the aortic valve. Moderate calcifications of the mitral annulus.  Mediastinum/Lymph Nodes: No pathologically  enlarged mediastinal or hilar lymph nodes. Esophagus is unremarkable in appearance. No axillary lymphadenopathy.  Lungs/Pleura: Mild linear scarring throughout the lower lungs bilaterally. No acute consolidative airspace disease. No pleural effusions. No suspicious appearing pulmonary nodules or masses.  Musculoskeletal/Soft Tissues: Median sternotomy wires. No aggressive appearing lytic or blastic lesions are noted in the visualized portions of the skeleton.  CTA ABDOMEN AND PELVIS FINDINGS  Hepatobiliary: No suspicious cystic or solid hepatic lesions. No intra or extrahepatic biliary ductal dilatation. Gallbladder is normal in appearance.  Pancreas: No pancreatic mass. No pancreatic ductal dilatation. No pancreatic or peripancreatic fluid or inflammatory changes.  Spleen: Unremarkable.  Adrenals/Urinary Tract: Indeterminate 1.6 cm right adrenal nodule (axial image 90 of series 14), stable in size dating back to 2005, presumably a benign lesion such as an adenoma. Left adrenal gland is normal in appearance. Low-attenuation lesions in both kidneys compatible with simple cysts, largest of which is exophytic measuring 1.8 cm in the lower pole of the right kidney. Several other  subcentimeter low-attenuation lesions in both kidneys are too small to definitively characterize, but are also statistically likely to represent small cysts. No hydroureteronephrosis. Urinary bladder is normal in appearance.  Stomach/Bowel: Normal appearance of the stomach. No pathologic dilatation of small bowel or colon. Numerous colonic diverticulae are noted, without surrounding inflammatory changes to suggest an acute diverticulitis at this time. Normal appendix.  Vascular/Lymphatic: Aortic atherosclerosis with fusiform ectasia of the infrarenal abdominal aorta which measures up to 2.9 x 2.7 cm. No aneurysm or dissection noted in the abdominal or pelvic vasculature. Vascular findings and  measurements pertinent to potential TAVR procedure, as detailed below. Celiac axis is widely patent, but there is mild stenosis in the proximal splenic artery. Superior mesenteric artery is extensively disease proximally where there is mild stenosis. Inferior mesenteric artery appears patent without hemodynamically significant stenosis. Single renal arteries bilaterally with some mild stenosis in the left renal artery. No lymphadenopathy identified in the abdomen or pelvis.  Reproductive: Prostate gland and seminal vesicles are unremarkable in appearance. No lymphadenopathy noted in the abdomen or pelvis.  Other: No significant volume of ascites.  No pneumoperitoneum.  Musculoskeletal: Chronic appearing compression fracture of superior endplate of L1 with 10% loss of anterior vertebral body height. There are no aggressive appearing lytic or blastic lesions noted in the visualized portions of the skeleton.  VASCULAR MEASUREMENTS PERTINENT TO TAVR:  AORTA:  Minimal Aortic Diameter-15 x 15 mm  Severity of Aortic Calcification-severe  RIGHT PELVIS:  Right Common Iliac Artery -  Minimal Diameter-9.5 x 10.8 mm  Tortuosity-mild  Calcification-moderate to severe  Right External Iliac Artery -  Minimal Diameter-9.0 x 6.9 mm  Tortuosity-very severe  Calcification-mild  Right Common Femoral Artery -  Minimal Diameter-6.8 x 7.2 mm  Tortuosity-mild  Calcification-severe  LEFT PELVIS:  Left Common Iliac Artery -  Minimal Diameter-9.2 x 10.3 mm  Tortuosity-mild  Calcification-moderate to severe  Left External Iliac Artery -  Minimal Diameter-7.8 x 7.4 mm  Tortuosity-severe  Calcification-mild  Left Common Femoral Artery -  Minimal Diameter-8.2 x 7.4 mm  Tortuosity-mild  Calcification-severe  Review of the MIP images confirms the above findings.  IMPRESSION: 1. Vascular findings and measurements pertinent to  potential TAVR procedure, as detailed above. 2. Severe thickening calcification of the aortic valve, compatible with the reported clinical history of severe aortic stenosis. 3. Aortic atherosclerosis, in addition to left main and 3 vessel coronary artery disease. Status post median sternotomy for CABG including LIMA to the LAD. 4. Colonic diverticulosis without evidence of acute diverticulitis at this time. 5. Additional incidental findings, as above.  EKG:  EKG is not ordered today.   Recent Labs: 03/08/2018: NT-Pro BNP 843 03/25/2018: B Natriuretic Peptide 595.5; TSH 1.604 03/28/2018: Magnesium 2.2 04/17/2018: BUN 26; Creatinine, Ser 1.31; Hemoglobin 13.3; Platelets 190; Potassium 5.2; Sodium 138  Recent Lipid Panel    Component Value Date/Time   CHOL 131 06/08/2016 1423   TRIG 95.0 06/08/2016 1423   TRIG 133 09/10/2011   HDL 39.50 06/08/2016 1423   CHOLHDL 3 06/08/2016 1423   VLDL 19.0 06/08/2016 1423   LDLCALC 73 06/08/2016 1423   LDLDIRECT 77 09/10/2011    Physical Exam:    VS:  BP 110/82   Pulse 80   Ht 6\' 1"  (1.854 m)   Wt 217 lb 6.4 oz (98.6 kg)   SpO2 98%   BMI 28.68 kg/m     Wt Readings from Last 3 Encounters:  11/06/18 217 lb 6.4 oz (98.6 kg)  10/31/18 220  lb (99.8 kg)  08/17/18 211 lb 9.6 oz (96 kg)     GEN: Elderly male in no distress HEENT: Normal NECK: No JVD; bilateral carotid bruits LYMPHATICS: No lymphadenopathy CARDIAC: RRR, 3/6 harsh late peaking systolic murmur with diminished A2 RESPIRATORY: Scattered rhonchi, otherwise clear ABDOMEN: Soft, non-tender, non-distended MUSCULOSKELETAL: Trace bilateral pretibial edema; No deformity  SKIN: Warm and dry NEUROLOGIC:  Alert and oriented x 3 PSYCHIATRIC:  Normal affect   ASSESSMENT:    1. Severe aortic stenosis   2. Chronic systolic congestive heart failure (HCC)    PLAN:    In order of problems listed above:  78. 85 year old gentleman with severe, stage D2 aortic stenosis (low-flow low  gradient).  The patient has previously had echo studies demonstrating severe aortic stenosis with a mean transvalvular gradient of 40 mmHg.  Since he has undergone pacemaker implantation, there has been some decline in LV systolic function.  I have personally reviewed his echo images which demonstrate a severely calcified and restricted aortic valve.  The mean transaortic valve gradient is 24 mmHg and the dimensionless index is 0.22.  I think the patient's LV systolic function is somewhat lower than that reported and I would estimate his LVEF at 40 to 45%.  He clearly has developed New [place] Heart Association functional class III symptoms of acute on chronic combined systolic and diastolic heart failure.  He has previously undergone extensive TAVR evaluation with CTA studies of the heart as well as the chest, abdomen, and pelvis.  The studies demonstrate suitable anatomy for transfemoral access for TAVR.  His cardiac catheterization demonstrated continued patency of all bypass grafts.  The patient is yet to undergo formal cardiac surgical consultation as part of a multidisciplinary evaluation.  I have recommended referral to cardiac surgery and the patient will either see Dr. Roxy Manns or Dr. Cyndia Bent.  We have reviewed the TAVR procedure at length and I demonstrated procedural animation's to the patient and his daughter today.  We discussed potential risks, expected recovery, and indications.  He would like to proceed with treatment as soon as possible.  All of his questions are answered today.  60 minutes is spent during today's visit, over half of which is spent face-to-face discussing natural history and treatment options of severe aortic stenosis.   Medication Adjustments/Labs and Tests Ordered: Current medicines are reviewed at length with the patient today.  Concerns regarding medicines are outlined above.  No orders of the defined types were placed in this encounter.  No orders of the defined types were  placed in this encounter.   Patient Instructions  Medication Instructions:  Your provider recommends that you continue on your current medications as directed. Please refer to the Current Medication list given to you today.    Labwork: None  Testing/Procedures: None  Follow-Up: You will be called to arrange an appointment with the surgeon!      Signed, Sherren Mocha, MD  11/06/2018 5:15 PM    [facility]

## 2018-11-08 ENCOUNTER — Encounter: Payer: Self-pay | Admitting: Cardiovascular Disease

## 2018-11-09 ENCOUNTER — Institutional Professional Consult (permissible substitution): Payer: Medicare Other | Admitting: Cardiovascular Disease

## 2018-11-14 ENCOUNTER — Encounter: Payer: Self-pay | Admitting: Physical Therapy

## 2018-11-14 ENCOUNTER — Ambulatory Visit: Payer: Medicare Other | Attending: Cardiovascular Disease | Admitting: Physical Therapy

## 2018-11-14 ENCOUNTER — Encounter: Payer: Self-pay | Admitting: Surgery

## 2018-11-14 ENCOUNTER — Institutional Professional Consult (permissible substitution): Payer: Medicare Other | Admitting: Surgery

## 2018-11-14 ENCOUNTER — Other Ambulatory Visit: Payer: Self-pay

## 2018-11-14 VITALS — BP 90/60 | HR 90 | Resp 18 | Ht 73.0 in | Wt 212.0 lb

## 2018-11-14 DIAGNOSIS — R262 Difficulty in walking, not elsewhere classified: Secondary | ICD-10-CM | POA: Insufficient documentation

## 2018-11-14 DIAGNOSIS — I350 Nonrheumatic aortic (valve) stenosis: Secondary | ICD-10-CM

## 2018-11-14 NOTE — Progress Notes (Signed)
Patient ID: JHAMARI MARKOWICZ, male   DOB: [birth year], 85 y.o.   MRN: [id number]  [facility] CONSULTATION REPORT  Referring Provider is [name], MD PCP is [name], MD  Chief Complaint  Patient presents with  . Aortic Stenosis    Surgical eval for TAVR, review studies    HPI:  The patient is an 85 year old gentleman with a history of diabetes, hypertension, hyperlipidemia, coronary disease status post coronary bypass graft surgery x4 by Dr. Darcey Nora in 2004, paroxysmal atrial fibrillation on Eliquis, heart block status post permanent pacemaker placement, aortic stenosis, congestive heart failure, COPD, and degenerative spine disease with reduced mobility who underwent evaluation last year for severe aortic stenosis with recommendations to proceed with TAVR evaluation.  The patient declined at that time but now returns due to progressive shortness of breath and fatigue which are limiting his ability to do his daily activities around the house.  He uses a rolling walker in and out of the house.  He is now getting short of breath with minimal activity such as walking quickly or any distance more than 10 to 20 feet.  He has had some lower extremity edema previously but has none now while on Demadex.  He denies any chest pressure pain.  He has had no dizziness or syncope.  He denies orthopnea but does get short of breath getting into bed and pulling the covers up.  His most recent echocardiogram on 09/05/2018 showed thickened and calcified aortic valve leaflets with reduced mobility.  The mean gradient was 24 mmHg with a peak gradient of 43 mmHg.  Aortic valve area was measured at 0.75 cm.  Left ventricular ejection fraction was 50 to 55% with grade 1 diastolic dysfunction.  The mitral valve leaflets were mildly thickened with a mean gradient across the mitral valve of 5 mmHg and a peak of 6 mmHg.  Cardiac  catheterization was last done on 10/20/2017 and showed severe three-vessel coronary disease with all 4 bypass grafts being patent.  The mean gradient across aortic valve at that time was 21.5 mmHg.  The patient is here today with his son.  The patient lives with his wife and is functionally independent.  The patient had his physical therapy evaluation today and his blood pressure prior to the 6-minute walk test was 87/64 with a pulse of 89.  After the 6-minute walk test his blood pressure dropped to 60/48 with a pulse of 116.  He was given 2 cups of water and his blood pressure improved to 98/60 10 minutes.  The patient said that he did not have any dizziness but had moderate shortness of breath with ambulation.    Past Medical History:  Diagnosis Date  . Abdominal aortic atherosclerosis ([place])    by xray  . Aortic stenosis 06/2014   moderate-severe  . Arthritis    in lower back (06/24/2015)  . Atrial flutter ([place])    notes 06/24/2015  . AV block, [place] 1 09/06/2017   Archie Endo 09/06/2017  . 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 CAD (coronary artery disease) 2004   a. s/p CABG in 2004 with LIMA-LAD, SVG-RCA, SVG-OM, and SVG-RI  . Carotid stenosis 1999   s/p L CEA  . CHF (congestive heart failure) ([place]) dx'd 06/2015   a. EF 55-60% by echo in 2016. b. 10/2016: EF reduced to 30-35% by TEE in the setting of atrial fibrillation with  RVR  . Colon polyp 2005   (Dr. Tiffany Kocher)  . Compression fracture of L1 lumbar vertebra (HCC) remote  . COPD (chronic obstructive pulmonary disease) ([place]) 03/2011   by xray  . Depression    hx  . Ex-smoker   . GERD (gastroesophageal reflux disease) 2003   h/o duodenal ulcer per EGD as well as esophagitis  . History of colon polyps 2003, 2005   adenomatous Vira Agar)  . HLD (hyperlipidemia)   . HTN (hypertension)   . Psoriasis   . Renal artery stenosis ([place]) 2004   70% bilateral, followed by cards  . T2DM (type 2 diabetes mellitus)  ([place]) 1995  . Urge incontinence of urine     Past Surgical History:  Procedure Laterality Date  . CARDIAC CATHETERIZATION  03/06/2003   No intervention - recommend CABG  . CARDIOVASCULAR STRESS TEST  11/2010   normal perfusion, no evidence of ischemia, EF 62% post exercise  . CARDIOVASCULAR STRESS TEST  11/28/2012   Mild diaphragmatic attenuation; cannot exclude a focal region of nontransmural inferior scar  . CARDIOVERSION N/A 06/25/2015   Procedure: CARDIOVERSION;  Surgeon: Pixie Casino, MD;  Location: [facility] ENDOSCOPY;  Service: Cardiovascular;  Laterality: N/A;  . CARDIOVERSION N/A 11/02/2016   Procedure: CARDIOVERSION;  Surgeon: [name], MD;  Location: [facility];  Service: Cardiovascular;  Laterality: N/A;  . CAROTID ENDARTERECTOMY Left 1999   ([place])  . CATARACT EXTRACTION W/ INTRAOCULAR LENS  IMPLANT, BILATERAL Bilateral 01/2013   [place]  . COLONOSCOPY  2003   colon polyp x3 - adenomatous Tiffany Kocher)  . COLONOSCOPY  10/08/2012   2 TA, diverticulosis, int hem, no rpt rec Tiffany Kocher)  . CORONARY ANGIOPLASTY    . CORONARY ARTERY BYPASS GRAFT  03/07/2003   4v CABG ([place]) with LIMA to LAD, vein graft to RCA, 1st obtuse marginal, and ramus intermedius  . ESOPHAGOGASTRODUODENOSCOPY  10/08/2012   nl esophagus, duodenitis and erosive gastropathy, path - gastropathy no Hpylori, no rpt rec  . LUMBAR EPIDURAL INJECTION  03/2017   L5/S1 ([place])  . PACEMAKER IMPLANT N/A 04/21/2018   Procedure: PACEMAKER IMPLANT -- Dual Chamber;  Surgeon: Deboraha Sprang, MD;  Location: [facility];  Service: Cardiovascular;  Laterality: N/A;  . PERIPHERAL VASCULAR CATHETERIZATION N/A 02/02/2016   Procedure: Renal Angiography;  Surgeon: [name], MD;  Location: [facility];  Service: Cardiovascular;  Laterality: N/A;  . PERIPHERAL VASCULAR CATHETERIZATION N/A 02/02/2016   Procedure: Abdominal Aortogram;  Surgeon: [name], MD;  Location: [facility];  Service: Cardiovascular;   Laterality: N/A;  . RENAL DOPPLER  11/29/2011   Celiac&SMA-demonstrated vessel narrowing suggestive of a greater than 50% diameter reduction. Bilateral renal arteries-demonstrated vessel narrowing of 60-99% diameter reduction. Rt Kidney-mid pole lateral simple cyst noted measuring 1.29x0.76x1.11cm and exophytic cyst outside lower pole measuring 1.23x0.96x1.31. Lft Kidney-lateral mid to lower pole simple cyst measuering-1.24x9.83x1.24  . RIGHT/LEFT HEART CATH AND CORONARY/GRAFT ANGIOGRAPHY N/A 10/20/2017   Procedure: RIGHT/LEFT HEART CATH AND CORONARY/GRAFT ANGIOGRAPHY;  Surgeon: Burnell Blanks, MD;  Location: [facility];  Service: Cardiovascular;  Laterality: N/A;  . TEE WITHOUT CARDIOVERSION N/A 06/25/2015   Procedure: TRANSESOPHAGEAL ECHOCARDIOGRAM (TEE);  Surgeon: Pixie Casino, MD;  Location: [facility] ENDOSCOPY;  Service: Cardiovascular;  Laterality: N/A;  . TEE WITHOUT CARDIOVERSION N/A 11/02/2016   Procedure: TRANSESOPHAGEAL ECHOCARDIOGRAM (TEE);  Surgeon: [name], MD;  Location: [facility] ENDOSCOPY;  Service: Cardiovascular;  Laterality: N/A;  . TONSILLECTOMY    . UPPER GASTROINTESTINAL ENDOSCOPY  2003   reflux esophagitis, erosive gastropathy, duodenal ulcer    Family History  Problem Relation Age of Onset  . Hypertension Father   . Heart disease Father   . Cancer Mother        GI cancer  . Diabetes Sister   . Heart disease Sister   . Stroke Sister   . Hypertension Sister   . Cancer Brother        Lung cancer - nonsmoker  . Cancer Maternal Grandmother   . Heart attack Maternal Grandfather   . Heart attack Paternal Grandmother   . Heart attack Paternal Grandfather   . Cancer Brother        Pancreatic cancer  . Lung disease Brother     Social History   Socioeconomic History  . Marital status: Widowed    Spouse name: Not on file  . Number of children: Not on file  . Years of education: Not on file  . Highest education level: Not on file  Occupational History   . Not on file  Social Needs  . Financial resource strain: Not on file  . Food insecurity:    Worry: Not on file    Inability: Not on file  . Transportation needs:    Medical: Not on file    Non-medical: Not on file  Tobacco Use  . Smoking status: Former Smoker    Packs/day: 0.50    Years: 36.00    Pack years: 18.00    Types: Cigarettes    Last attempt to quit: 07/23/2013    Years since quitting: 5.3  . Smokeless tobacco: Never Used  Substance and Sexual Activity  . Alcohol use: No  . Drug use: No  . Sexual activity: Not Currently  Lifestyle  . Physical activity:    Days per week: Not on file    Minutes per session: Not on file  . Stress: Not on file  Relationships  . Social connections:    Talks on phone: Not on file    Gets together: Not on file    Attends religious service: Not on file    Active member of club or organization: Not on file    Attends meetings of clubs or organizations: Not on file    Relationship status: Not on file  . Intimate partner violence:    Fear of current or ex partner: Not on file    Emotionally abused: Not on file    Physically abused: Not on file    Forced sexual activity: Not on file  Other Topics Concern  . Not on file  Social History Narrative   Caffeine: 4 cups coffee/day, some tea and soda   Lives with wife   Occupation: Retired   Activity: no regular exercise   Diet: good water, fruits/vegetables daily    Current Outpatient Medications  Medication Sig Dispense Refill  . acetaminophen (TYLENOL) 500 MG tablet Take 1 tablet (500 mg total) by mouth 3 (three) times daily.    [name] atorvastatin (LIPITOR) 20 MG tablet Take 1 tablet (20 mg total) by mouth daily. 90 tablet 3  . citalopram (CELEXA) 20 MG tablet Take 1 tablet (20 mg total) by mouth daily. 90 tablet 3  . ELIQUIS 5 MG TABS tablet Take 1 tablet (5 mg total) by mouth 2 (two) times daily. 60 tablet 3  . finasteride (PROSCAR) 5 MG tablet Take 1 tablet (5 mg total) by mouth  daily. 30 tablet 3  . glucose blood (ACCU-CHEK AVIVA PLUS)  test strip 1 each by Other route as needed for other. Use as instructed to check blood sugar 3 (three) times a day.  Dx code:  E11.8, E11.65 300 each 0  . insulin glargine (LANTUS) 100 UNIT/ML injection Inject 20 Units into the skin 2 (two) times daily.    [name] lisinopril (PRINIVIL,ZESTRIL) 40 MG tablet Take 1 tablet (40 mg total) by mouth daily. 90 tablet 2  . metFORMIN (GLUCOPHAGE) 1000 MG tablet Take 1 tablet (1,000 mg total) by mouth 2 (two) times daily with a meal. 180 tablet 1  . nitroGLYCERIN (NITROSTAT) 0.4 MG SL tablet Place 1 tablet (0.4 mg total) under the tongue every 5 (five) minutes as needed for chest pain. 90 tablet 3  . pantoprazole (PROTONIX) 20 MG tablet Take 1 tablet (20 mg total) by mouth daily. 30 tablet 6  . Potassium Chloride ER 20 MEQ TBCR Take 20 mEq by mouth every Monday, Wednesday, Friday, Saturday, and Sunday at 6 PM. 30 tablet 5  . tamsulosin (FLOMAX) 0.4 MG CAPS capsule Take 1 capsule (0.4 mg total) by mouth daily after supper. 30 capsule 6  . torsemide (DEMADEX) 20 MG tablet Take 40 mg by mouth twice daily if weight>205 lb; OTHERWISE Take 20 mg by mouth twice daily if weight is 200-205 lb on home weight scale. 120 tablet 3  . traZODone (DESYREL) 50 MG tablet Take 50 mg by mouth at bedtime.     No current facility-administered medications for this visit.     Allergies  Allergen Reactions  . Gabapentin Other (See Comments)    Leg pain, weakness   . Metoprolol Swelling  . Spironolactone Other (See Comments)    Painful gynecomastia  . Amlodipine Other (See Comments)    Edema  . Other Other (See Comments)    Horse serum - myalgias  . Rosiglitazone Maleate Other (See Comments)    REACTION: Did not help  . Tricor [Fenofibrate] Other (See Comments)    myalgias      Review of Systems:   General:  normal appetite, decreased energy, no weight gain, no weight loss, no fever  Cardiac:  no chest pain with  exertion, no chest pain at rest, +SOB with mild exertion, no resting SOB, no PND, + orthopnea, no palpitations, + arrhythmia, + atrial fibrillation, no LE edema, no dizzy spells, no syncope  Respiratory:  + exertional shortness of breath, no home oxygen, no productive cough, no dry cough, no bronchitis, no wheezing, no hemoptysis, no asthma, no pain with inspiration or cough, no sleep apnea, no CPAP at night  GI:   no difficulty swallowing, no reflux, no frequent heartburn, no hiatal hernia, no abdominal pain, no constipation, no diarrhea, no hematochezia, no hematemesis, no melena  GU:   no dysuria,  no frequency, no urinary tract infection, no hematuria, no enlarged prostate, no kidney stones, no kidney disease  Vascular:  no pain suggestive of claudication, no pain in feet, no leg cramps, no varicose veins, no DVT, no non-healing foot ulcer  Neuro:   no stroke, no TIA's, no seizures, no headaches, no temporary blindness one eye,  no slurred speech, no peripheral neuropathy, no chronic pain, + instability of gait, no memory/cognitive dysfunction  Musculoskeletal: + arthritis, no joint swelling, no myalgias, + difficulty walking, + reduced mobility   Skin:   no rash, no itching, no skin infections, no pressure sores or ulcerations  Psych:   + anxiety, + depression, no nervousness, no unusual recent stress  Eyes:  no blurry vision, no floaters, no recent vision changes, + wears glasses  ENT:   + hearing loss and wears hearing aids, no loose or painful teeth, + dentures,   Hematologic:  no easy bruising, no abnormal bleeding, no clotting disorder, no frequent epistaxis  Endocrine:  + diabetes, does  check CBG's at home       Physical Exam:   BP 90/60 (BP Location: Left Arm, Patient Position: Sitting, Cuff Size: Large) Comment: manually  Pulse 90   Resp 18   Ht 6\' 1"  (1.854 m)   Wt 212 lb (96.2 kg)   SpO2 94% Comment: RA  BMI 27.97 kg/m   General:  Elderly but   well-appearing  HEENT:  Unremarkable, NCAT, PERLA, EOMI, oropharynx clear  Neck:   no JVD, no bruits, no adenopathy or thyromegaly  Chest:   clear to auscultation, symmetrical breath sounds, no wheezes, no rhonchi   CV:   RRR, grade III/VI crescendo/decrescendo murmur heard best at RSB,  no diastolic murmur  Abdomen:  soft, non-tender, no masses or organomegaly  Extremities:  warm, well-perfused, pulses palpable in feet, no LE edema  Rectal/GU  Deferred  Neuro:   Grossly non-focal and symmetrical throughout  Skin:   Clean and dry, no rashes, no breakdown   Diagnostic Tests:               *[facility]*                   *[facility]*                         [address], [place] [address]                            [phone]  ------------------------------------------------------------------- Transthoracic Echocardiography  Patient:    Konrad, Hoak MR #:       [id number] Study Date: 03/26/2018 Gender:     M Age:        47 Height:     185.4 cm Weight:     94.8 kg BSA:        2.22 m^2 Pt. Status: Room:       3E06C   ADMITTING    [place], [place], Brittainy M  REFERRING    Lyda Jester M  PERFORMING   [facility], Inpatient  SONOGRAPHER  Roseanna Rainbow  cc:  ------------------------------------------------------------------- LV EF: 60% -   65%  ------------------------------------------------------------------- Indications:      CHF - 428.0.  ------------------------------------------------------------------- History:   PMH:  Aortic stenosis. Elevated troponin.  Dyspnea. Aortic valve disease.  Risk factors:  Hypertension. Diabetes mellitus. Dyslipidemia.  ------------------------------------------------------------------- Study Conclusions  - Left ventricle: The cavity size was normal. Wall thickness was   increased in a pattern of severe LVH.  Systolic function was   normal. The estimated ejection fraction was in the range of 60%   to 65%. Wall motion was normal; there were no regional wall   motion abnormalities. Doppler parameters are consistent with   abnormal left ventricular relaxation (grade 1 diastolic   dysfunction). - Aortic valve: Valve mobility was restricted. The right coronary   cusp was heavily calcified and  immobile. There was severe   stenosis. There was trivial regurgitation. Valve area (VTI): 1.48   cm^2. Valve area (Vmax): 1.38 cm^2. Valve area (Vmean): 1.59   cm^2. - Aortic root: The aortic root was mildly dilated. - Mitral valve: Moderately calcified annulus. Mildly thickened   leaflets . The findings are consistent with mild stenosis. There   was mild regurgitation. Valve area by continuity equation (using   LVOT flow): 2.48 cm^2. - Left atrium: The atrium was moderately dilated. - Right ventricle: The cavity size was mildly dilated. Wall   thickness was normal. Systolic function was mildly reduced. - Right atrium: The atrium was moderately dilated.  ------------------------------------------------------------------- Labs, prior tests, procedures, and surgery: ECG.     Abnormal.  ------------------------------------------------------------------- Study data:  Comparison was made to the study of 08/08/2017.  Study status:  Routine.  Procedure:  Transthoracic echocardiography. Image quality was poor. The study was technically difficult, as a result of poor sound wave transmission and restricted patient mobility.  Study completion:  There were no complications. Transthoracic echocardiography.  M-mode, complete 2D, spectral Doppler, and color Doppler.  Birthdate:  Patient birthdate: 1932-03-25.  Age:  Patient is 85 yr old.  Sex:  Gender: male. BMI: 27.6 kg/m^2.  Blood pressure:     127/68  Patient status: Inpatient.  Study date:  Study date: 03/26/2018. Study time: 02:30 PM.  Location:   Bedside.  -------------------------------------------------------------------  ------------------------------------------------------------------- Left ventricle:  The cavity size was normal. Wall thickness was increased in a pattern of severe LVH. Systolic function was normal. The estimated ejection fraction was in the range of 60% to 65%. Wall motion was normal; there were no regional wall motion abnormalities. Doppler parameters are consistent with abnormal left ventricular relaxation (grade 1 diastolic dysfunction).  ------------------------------------------------------------------- Aortic valve:   Trileaflet; severely calcified leaflets. Valve mobility was restricted. The right coronary cusp was heavily calcified and immobile.  Doppler:   There was severe stenosis. There was trivial regurgitation.    VTI ratio of LVOT to aortic valve: 0.43. Valve area (VTI): 1.48 cm^2. Indexed valve area (VTI): 0.67 cm^2/m^2. Peak velocity ratio of LVOT to aortic valve: 0.4. Valve area (Vmax): 1.38 cm^2. Indexed valve area (Vmax): 0.62 cm^2/m^2. Mean velocity ratio of LVOT to aortic valve: 0.46. Valve area (Vmean): 1.59 cm^2. Indexed valve area (Vmean): 0.72 cm^2/m^2.    Mean gradient (S): 40 mm Hg. Peak gradient (S): 76 mm Hg.  ------------------------------------------------------------------- Aorta:  Aortic root: The aortic root was mildly dilated.  ------------------------------------------------------------------- Mitral valve:   Moderately calcified annulus. Mildly thickened leaflets .  Doppler:   The findings are consistent with mild stenosis.   There was mild regurgitation.    Valve area by pressure half-time: 4.31 cm^2. Indexed valve area by pressure half-time: 1.94 cm^2/m^2. Valve area by continuity equation (using LVOT flow): 2.48 cm^2. Indexed valve area by continuity equation (using LVOT flow): 1.11 cm^2/m^2.    Mean gradient (D): 7 mm Hg. Peak gradient (D): 13 mm  Hg.  ------------------------------------------------------------------- Left atrium:  The atrium was moderately dilated.  ------------------------------------------------------------------- Right ventricle:  The cavity size was mildly dilated. Wall thickness was normal. Systolic function was mildly reduced.  ------------------------------------------------------------------- Pulmonic valve:   Poorly visualized.  Doppler:  Transvalvular velocity was within the normal range. There was no evidence for stenosis. There was no significant regurgitation.  ------------------------------------------------------------------- Tricuspid valve:   Structurally normal valve.    Doppler: Transvalvular velocity was within the normal range. There was no regurgitation.  ------------------------------------------------------------------- Pulmonary artery:   The  main pulmonary artery was normal-sized. Systolic pressure was within the normal range.  ------------------------------------------------------------------- Right atrium:  The atrium was moderately dilated.  ------------------------------------------------------------------- Pericardium:  There was no pericardial effusion.  ------------------------------------------------------------------- Systemic veins: Inferior vena cava: The vessel was normal in size.  ------------------------------------------------------------------- Measurements   Left ventricle                           Value          Reference  LV ID, ED, PLAX chordal          (L)     38    mm       43 - 52  LV ID, ES, PLAX chordal                  26    mm       23 - 38  LV fx shortening, PLAX chordal           32    %        >=29  LV PW thickness, ED                      20    mm       ----------  IVS/LV PW ratio, ED                      0.9            <=1.3  Stroke volume, 2D                        129   ml       ----------  Stroke volume/bsa, 2D                     58    ml/m^2   ----------  LV ejection fraction, 1-p A4C            65    %        ----------  LV end-diastolic volume, 2-p             83    ml       ----------  LV end-systolic volume, 2-p              30    ml       ----------  LV ejection fraction, 2-p                64    %        ----------  Stroke volume, 2-p                       53    ml       ----------  LV end-diastolic volume/bsa, 2-p         37    ml/m^2   ----------  LV end-systolic volume/bsa, 2-p          13    ml/m^2   ----------  Stroke volume/bsa, 2-p                   23.7  ml/m^2   ----------    Ventricular septum                       Value  Reference  IVS thickness, ED                        18    mm       ----------    LVOT                                     Value          Reference  LVOT ID, S                               21    mm       ----------  LVOT area                                3.46  cm^2     ----------  LVOT peak velocity, S                    174   cm/s     ----------  LVOT mean velocity, S                    132   cm/s     ----------  LVOT VTI, S                              37.3  cm       ----------  LVOT peak gradient, S                    12    mm Hg    ----------    Aortic valve                             Value          Reference  Aortic valve peak velocity, S            437   cm/s     ----------  Aortic valve mean velocity, S            287   cm/s     ----------  Aortic valve VTI, S                      87.1  cm       ----------  Aortic mean gradient, S                  40    mm Hg    ----------  Aortic peak gradient, S                  76    mm Hg    ----------  VTI ratio, LVOT/AV                       0.43           ----------  Aortic valve area, VTI                   1.48  cm^2     ----------  Aortic valve area/bsa, VTI  0.67  cm^2/m^2 ----------  Velocity ratio, peak, LVOT/AV            0.4            ----------  Aortic valve area, peak velocity         1.38  cm^2      ----------  Aortic valve area/bsa, peak              0.62  cm^2/m^2 ----------  velocity  Velocity ratio, mean, LVOT/AV            0.46           ----------  Aortic valve area, mean velocity         1.59  cm^2     ----------  Aortic valve area/bsa, mean              0.72  cm^2/m^2 ----------  velocity    Aorta                                    Value          Reference  Aortic root ID, ED                       40    mm       ----------  Ascending aorta ID, A-P, S               37    mm       ----------    Left atrium                              Value          Reference  LA ID, A-P, ES                           43    mm       ----------  LA ID/bsa, A-P                           1.93  cm/m^2   <=2.2  LA volume, S                             67.8  ml       ----------  LA volume/bsa, S                         30.5  ml/m^2   ----------  LA volume, ES, 1-p A4C                   64.2  ml       ----------  LA volume/bsa, ES, 1-p A4C               28.9  ml/m^2   ----------  LA volume, ES, 1-p A2C                   72.1  ml       ----------  LA volume/bsa, ES, 1-p A2C               32.4  ml/m^2   ----------    Mitral valve  Value          Reference  Mitral E-wave peak velocity              182   cm/s     ----------  Mitral A-wave peak velocity              197   cm/s     ----------  Mitral mean velocity, D                  118   cm/s     ----------  Mitral deceleration time         (L)     130   ms       150 - 230  Mitral pressure half-time                38    ms       ----------  Mitral mean gradient, D                  7     mm Hg    ----------  Mitral peak gradient, D                  13    mm Hg    ----------  Mitral E/A ratio, peak                   0.9            ----------  Mitral valve area, PHT, DP               4.31  cm^2     ----------  Mitral valve area/bsa, PHT, DP           1.94  cm^2/m^2 ----------  Mitral valve area, LVOT                  2.48   cm^2     ----------  continuity  Mitral valve area/bsa, LVOT              1.11  cm^2/m^2 ----------  continuity  Mitral annulus VTI, D                    52.1  cm       ----------    Right atrium                             Value          Reference  RA ID, S-I, ES, A4C              (H)     61.2  mm       34 - 49  RA area, ES, A4C                 (H)     26.7  cm^2     8.3 - 19.5  RA volume, ES, A/L                       94.9  ml       ----------  RA volume/bsa, ES, A/L                   42.7  ml/m^2   ----------    Right ventricle  Value          Reference  TAPSE                                    14.7  mm       ----------  Legend: (L)  and  (H)  mark values outside specified reference range.  ------------------------------------------------------------------- Prepared and Electronically Authenticated by  Pierre Bali, MD 2019-06-23T16:06:28   Physicians   Panel Physicians Referring Physician Case Authorizing Physician  Burnell Blanks, MD (Primary)    Procedures   RIGHT/LEFT HEART CATH AND CORONARY/GRAFT ANGIOGRAPHY  Conclusion     Ost RCA to Prox RCA lesion is 100% stenosed.  SVG graft was visualized by angiography and is normal in caliber.  The graft exhibits minimal luminal irregularities.  SVG graft was visualized by angiography and is normal in caliber.  The graft exhibits no disease.  SVG graft was visualized by angiography and is normal in caliber.  The graft exhibits no disease.  LIMA graft was visualized by non-selective angiography and is normal in caliber.  The graft exhibits no disease.  Ost Cx to Prox Cx lesion is 99% stenosed.  Ost 1st Mrg lesion is 99% stenosed.  Ost 2nd Mrg lesion is 100% stenosed.  Ost LAD to Prox LAD lesion is 99% stenosed.  Ost 1st Diag lesion is 50% stenosed.  Hemodynamic findings consistent with mild pulmonary hypertension.   1. 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 Severe stenosis proximal LAD. The mid and distal LAD filled from the patent LIMA graft.  3 Severe stenosis proximal Circumflex involving the intermediate branch and the first OM. The vein graft to the intermediate branch is patent. The vein graft to the first OM branch is patent.  4. Chronic occlusion of the proximal RCa. The vein graft to the distal RCA is patent.  5. Aortic valve stenosis (mean gradient 21.5 mmHg, peak to peak gradient 23 mmHg, AVA 1.12 cm2).   Recommendations: I will review his data with Dr. Burt Knack and the TAVR team. Will proceed with planning for TAVR.    Indications   Severe aortic stenosis [I35.0 (ICD-10-CM)]  Coronary artery disease involving native coronary artery of native heart without angina pectoris [I25.10 (ICD-10-CM)]  Procedural Details   Technical Details Indication: 85 yo male with severe AS, CAD s/p CABG. Workup for TAVR in progress.   Procedure: The risks, benefits, complications, treatment options, and expected outcomes were discussed with the patient. The patient and/or family concurred with the proposed plan, giving informed consent. The patient was sedated with Versed and Fentanyl. There was an IV catheter present in a vein in the right antecubital area. This area was prepped and draped. I then changed out the IV for a 5 French sheath. A right heart catheterization was performed with a balloon tipped catheter. The left wrist was prepped and draped in a sterile fashion. 1% lidocaine was used for local anesthesia. Using the modified Seldinger access technique, a 5 French sheath was placed in the left radial artery. 3 mg Verapamil was given through the sheath. 5000 units IV heparin was given. Standard diagnostic catheters were used to perform selective coronary angiography. I used the JR4 catheter to engage the RCA and all vein grafts. I crossed the Aortic valve with the JR4 catheter. LV pressures measured. No LV gram. The sheath was removed from the  left radial artery and a  Terumo hemostasis band was applied at the arteriotomy site on the left wrist.     Estimated blood loss <50 mL.  During this procedure the patient was administered the following to achieve and maintain moderate conscious sedation: Versed 1 mg, Fentanyl 25 mcg, while the patient's heart rate, blood pressure, and oxygen saturation were continuously monitored. The period of conscious sedation was 54 minutes, of which I was present face-to-face 100% of this time.  Medications  (Filter: Administrations occurring from 10/20/17 1153 to 10/20/17 1309)  Medication Rate/Dose/Volume Action  Date Time   fentaNYL (SUBLIMAZE) injection (mcg) 25 mcg Given 10/20/17 1203   Total dose as of 11/14/18 1650        25 mcg        midazolam (VERSED) injection (mg) 1 mg Given 10/20/17 1204   Total dose as of 11/14/18 1650        1 mg        lidocaine (PF) (XYLOCAINE) 1 % injection (mL) 2 mL Given 10/20/17 1221   Total dose as of 11/14/18 1650 2 mL Given 1229   4 mL        Radial Cocktail/Verapamil only (mL) 10 mL Given 10/20/17 1231   Total dose as of 11/14/18 1650        10 mL        heparin injection (Units) 5,000 Units Given 10/20/17 1243   Total dose as of 11/14/18 1650        5,000 Units        heparin infusion 2 units/mL in 0.9 % sodium chloride (mL) 1,000 mL New Bag/Given 10/20/17 1303   Total dose as of 11/14/18 1650        Cannot be calculated        iopamidol (ISOVUE-370) 76 % injection (mL) 115 mL Given 10/20/17 1303   Total dose as of 11/14/18 1650        115 mL        Sedation Time   Sedation Time Physician-1: 54 minutes 3 seconds  Complications   Complications documented before study signed (10/20/2017 1:18 PM EST)    RIGHT/LEFT HEART CATH AND CORONARY/GRAFT ANGIOGRAPHY   None Documented by Burnell Blanks, MD 10/20/2017 1:15 PM EST  Time Range: Intraprocedure      Coronary Findings   Diagnostic  Dominance: Right  Left Anterior Descending  Ost LAD  to Prox LAD lesion 99% stenosed  Ost LAD to Prox LAD lesion is 99% stenosed.  First Diagonal Branch  Ost 1st Diag lesion 50% stenosed  Ost 1st Diag lesion is 50% stenosed.  Left Circumflex  Ost Cx to Prox Cx lesion 99% stenosed  Ost Cx to Prox Cx lesion is 99% stenosed.  First Obtuse Marginal Branch  Vessel is moderate in size.  Ost 1st Mrg lesion 99% stenosed  Ost 1st Mrg lesion is 99% stenosed.  Second Obtuse Marginal Branch  Vessel is moderate in size.  Ost 2nd Mrg lesion 100% stenosed  Ost 2nd Mrg lesion is 100% stenosed.  Right Coronary Artery  Vessel is large.  Ost RCA to Prox RCA lesion 100% stenosed  Ost RCA to Prox RCA lesion is 100% stenosed. The lesion is chronically occluded.  saphenous Graft to RPDA  SVG graft was visualized by angiography and is normal in caliber. The graft exhibits minimal luminal irregularities.  saphenous Graft to 1st Mrg  SVG graft was visualized by angiography and is normal in caliber. The graft exhibits no disease.  saphenous Graft  to 2nd Mrg  SVG graft was visualized by angiography and is normal in caliber. The graft exhibits no disease.  LIMA LIMA Graft to Mid LAD  LIMA graft was visualized by non-selective angiography and is normal in caliber. The graft exhibits no disease.  Intervention   No interventions have been documented.  Right Heart   Right Heart Pressures Hemodynamic findings consistent with mild pulmonary hypertension. Elevated LV EDP consistent with volume overload.  Coronary Diagrams   Diagnostic  Dominance: Right    Intervention   Implants    No implant documentation for this case.  Syngo Images   Show images for CARDIAC CATHETERIZATION  MERGE Images   Show images for CARDIAC CATHETERIZATION   Link to Procedure Log   Procedure Log    Hemo Data    Most Recent Value  Fick Cardiac Output 4.29 L/min  Fick Cardiac Output Index 1.91 (L/min)/BSA  Aortic Mean Gradient 21.5 mmHg  Aortic Peak Gradient 23 mmHg    Aortic Valve Area 1.12  Aortic Value Area Index 0.5 cm2/BSA  RA A Wave 10 mmHg  RA V Wave 11 mmHg  RA Mean 9 mmHg  RV Systolic Pressure 45 mmHg  RV Diastolic Pressure 3 mmHg  RV EDP 11 mmHg  PA Systolic Pressure 47 mmHg  PA Diastolic Pressure 20 mmHg  PA Mean 30 mmHg  PW A Wave 14 mmHg  PW V Wave 18 mmHg  PW Mean 12 mmHg  AO Systolic Pressure 332 mmHg  AO Diastolic Pressure 72 mmHg  AO Mean 951 mmHg  LV Systolic Pressure 884 mmHg  LV Diastolic Pressure 9 mmHg  LV EDP 23 mmHg  AOp Systolic Pressure 166 mmHg  AOp Diastolic Pressure 76 mmHg  AOp Mean Pressure 063 mmHg  LVp Systolic Pressure 016 mmHg  LVp Diastolic Pressure 11 mmHg  LVp EDP Pressure 25 mmHg  QP/QS 1  TPVR Index 15.74 HRUI  TSVR Index 50.88 HRUI  PVR SVR Ratio 0.2  TPVR/TSVR Ratio 0.31    Addendum   ADDENDUM REPORT: 04/13/2018 14:43  CLINICAL DATA:  85 year old male with severe aortic stenosis being evaluated for a TAVR procedure.  EXAM: Cardiac TAVR CT  TECHNIQUE: The patient was scanned on a [employer]. A 120 kV retrospective scan was triggered in the descending thoracic aorta at 111 HU's. Gantry rotation speed was 250 msecs and collimation was .6 mm. No beta blockade or nitro were given. The 3D data set was reconstructed in 5% intervals of the R-R cycle. Systolic and diastolic phases were analyzed on a dedicated work station using MPR, MIP and VRT modes. The patient received 80 cc of contrast.  FINDINGS: Aortic Valve: Trileaflet aortic valve with severely thickened and calcified leaflets and severely decreased leaflets opening. There are no calcifications extending into the LVOT.  Aorta: Normal size with diffuse atheroma and calcifications, no dissection.  Sinotubular Junction: 31 x 30 mm  Ascending Thoracic Aorta: 38 x 36 mm  Aortic Arch: 32 x 29 mm  Descending Thoracic Aorta: 30 x 29 mm  Sinus of Valsalva Measurements:  Non-coronary: 35 mm  Right  -coronary: 32 mm  Left -coronary: 35 mm  Coronary Artery Height above Annulus:  Left Main: 14 mm  Right Coronary: 16 mm  Virtual Basal Annulus Measurements:  Maximum/Minimum Diameter: 27.9 x 20.1 mm  Mean Diameter: 23.5 mm  Perimeter: 76.8 mm  Area: 433 mm2  Optimum Fluoroscopic Angle for Delivery: LAO 5 CAU 5  IMPRESSION: 1. Trileaflet aortic valve with severely thickened  and calcified leaflets and severely decreased leaflets opening. There are no calcifications extending into the LVOT. Annular measurements suitable for delivery of a 26 mm Edwards-SAPIEN 3 valve.  2. Sufficient coronary to annulus distance.  3. Optimum Fluoroscopic Angle for Delivery:  LAO 5 CAU 5  4. No thrombus in the left atrial appendage.   Electronically Signed   By: Ena Dawley   On: 04/13/2018 14:43   Addended by Dorothy Spark, MD on 04/13/2018 2:45 PM    CLINICAL DATA:  85 year old male with history of severe aortic stenosis. Preprocedural study prior to potential transcatheter aortic valve replacement (TAVR) procedure.  EXAM: CT ANGIOGRAPHY CHEST, ABDOMEN AND PELVIS  TECHNIQUE: Multidetector CT imaging through the chest, abdomen and pelvis was performed using the standard protocol during bolus administration of intravenous contrast. Multiplanar reconstructed images and MIPs were obtained and reviewed to evaluate the vascular anatomy.  CONTRAST:  133mL ISOVUE-370 IOPAMIDOL (ISOVUE-370) INJECTION 76%  COMPARISON:  CT the chest, abdomen and pelvis 07/18/2004.  FINDINGS: CTA CHEST FINDINGS  Cardiovascular: Heart size is mildly enlarged. There is no significant pericardial fluid, thickening or pericardial calcification. There is aortic atherosclerosis, as well as atherosclerosis of the great vessels of the mediastinum and the coronary arteries, including calcified atherosclerotic plaque in the left main, left anterior descending, left circumflex  and right coronary arteries. Status post median sternotomy for CABG including LIMA to the LAD. Severe thickening calcification of the aortic valve. Moderate calcifications of the mitral annulus.  Mediastinum/Lymph Nodes: No pathologically enlarged mediastinal or hilar lymph nodes. Esophagus is unremarkable in appearance. No axillary lymphadenopathy.  Lungs/Pleura: Mild linear scarring throughout the lower lungs bilaterally. No acute consolidative airspace disease. No pleural effusions. No suspicious appearing pulmonary nodules or masses.  Musculoskeletal/Soft Tissues: Median sternotomy wires. No aggressive appearing lytic or blastic lesions are noted in the visualized portions of the skeleton.  CTA ABDOMEN AND PELVIS FINDINGS  Hepatobiliary: No suspicious cystic or solid hepatic lesions. No intra or extrahepatic biliary ductal dilatation. Gallbladder is normal in appearance.  Pancreas: No pancreatic mass. No pancreatic ductal dilatation. No pancreatic or peripancreatic fluid or inflammatory changes.  Spleen: Unremarkable.  Adrenals/Urinary Tract: Indeterminate 1.6 cm right adrenal nodule (axial image 90 of series 14), stable in size dating back to 2005, presumably a benign lesion such as an adenoma. Left adrenal gland is normal in appearance. Low-attenuation lesions in both kidneys compatible with simple cysts, largest of which is exophytic measuring 1.8 cm in the lower pole of the right kidney. Several other subcentimeter low-attenuation lesions in both kidneys are too small to definitively characterize, but are also statistically likely to represent small cysts. No hydroureteronephrosis. Urinary bladder is normal in appearance.  Stomach/Bowel: Normal appearance of the stomach. No pathologic dilatation of small bowel or colon. Numerous colonic diverticulae are noted, without surrounding inflammatory changes to suggest an acute diverticulitis at this time. Normal  appendix.  Vascular/Lymphatic: Aortic atherosclerosis with fusiform ectasia of the infrarenal abdominal aorta which measures up to 2.9 x 2.7 cm. No aneurysm or dissection noted in the abdominal or pelvic vasculature. Vascular findings and measurements pertinent to potential TAVR procedure, as detailed below. Celiac axis is widely patent, but there is mild stenosis in the proximal splenic artery. Superior mesenteric artery is extensively disease proximally where there is mild stenosis. Inferior mesenteric artery appears patent without hemodynamically significant stenosis. Single renal arteries bilaterally with some mild stenosis in the left renal artery. No lymphadenopathy identified in the abdomen or pelvis.  Reproductive: Prostate gland  and seminal vesicles are unremarkable in appearance. No lymphadenopathy noted in the abdomen or pelvis.  Other: No significant volume of ascites.  No pneumoperitoneum.  Musculoskeletal: Chronic appearing compression fracture of superior endplate of L1 with 78% loss of anterior vertebral body height. There are no aggressive appearing lytic or blastic lesions noted in the visualized portions of the skeleton.  VASCULAR MEASUREMENTS PERTINENT TO TAVR:  AORTA:  Minimal Aortic Diameter-15 x 15 mm  Severity of Aortic Calcification-severe  RIGHT PELVIS:  Right Common Iliac Artery -  Minimal Diameter-9.5 x 10.8 mm  Tortuosity-mild  Calcification-moderate to severe  Right External Iliac Artery -  Minimal Diameter-9.0 x 6.9 mm  Tortuosity-very severe  Calcification-mild  Right Common Femoral Artery -  Minimal Diameter-6.8 x 7.2 mm  Tortuosity-mild  Calcification-severe  LEFT PELVIS:  Left Common Iliac Artery -  Minimal Diameter-9.2 x 10.3 mm  Tortuosity-mild  Calcification-moderate to severe  Left External Iliac Artery -  Minimal Diameter-7.8 x 7.4  mm  Tortuosity-severe  Calcification-mild  Left Common Femoral Artery -  Minimal Diameter-8.2 x 7.4 mm  Tortuosity-mild  Calcification-severe  Review of the MIP images confirms the above findings.  IMPRESSION: 1. Vascular findings and measurements pertinent to potential TAVR procedure, as detailed above. 2. Severe thickening calcification of the aortic valve, compatible with the reported clinical history of severe aortic stenosis. 3. Aortic atherosclerosis, in addition to left main and 3 vessel coronary artery disease. Status post median sternotomy for CABG including LIMA to the LAD. 4. Colonic diverticulosis without evidence of acute diverticulitis at this time. 5. Additional incidental findings, as above.  Aortic Atherosclerosis (ICD10-I70.0).   Electronically Signed   By: Vinnie Langton M.D.   On: 04/12/2018 15:30   [facility] Adult Cardiac Surgery Database Version 2.9  Procedure: Isolated AVR   Risk of Mortality: 4.280%  Renal Failure: 3.990%   Permanent Stroke: 2.020%  Prolonged Ventilation: 12.650%  DSW Infection: 0.359%  Reoperation: 3.776%  Morbidity or Mortality: 17.345%  Short Length of Stay: 21.480%   Long Length of Stay: 11.287%    Impression:  This 85 year old gentleman has stage D, severe, symptomatic aortic stenosis with New York Heart Association class III symptoms of exertional fatigue and shortness of breath with a reduced left ventricular ejection fraction of 40 to 45% with grade 1 diastolic dysfunction consistent with chronic combined systolic and diastolic congestive heart failure.  I have personally reviewed his 2D echocardiogram, cardiac catheterization, and CT images.  His echocardiogram shows a trileaflet aortic valve with calcification and reduced mobility of the valve leaflets.  The peak velocity across aortic valve is 326 cm/s with a mean gradient of 24 mmHg.  The aortic valve area was measured at 0.75 cm.  Although his  gradient is still in the moderate range his valve looks severely stenotic and he is clearly developing progressive symptoms.  His last cardiac catheterization was a year ago but he had all patent bypass grafts and has had no anginal symptoms.  I agree that aortic valve replacement is indicated in this patient who now has progressive symptoms that are limiting his daily functioning.  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  I think transcatheter aortic valve placement be the best option for him.  His gated cardiac CTA shows anatomy suitable for TAVR using a 26 mm Sapien 3 valve with no complicating features.  His  abdominal and pelvic CTA shows adequate pelvic vascular anatomy to allow transfemoral insertion.  He does have significant tortuosity of the right iliac artery which would likely make it difficult to pass the transcatheter valve up that side.  The left iliac system is much less tortuous and looks adequate for access.  He does have significant calcified plaque in the common femoral arteries bilaterally but I think there is suitable access above this calcification.  The patient and his son were counseled at length regarding treatment alternatives for management of severe symptomatic aortic stenosis. The risks and benefits of surgical intervention has been discussed in detail. Long-term prognosis with medical therapy was discussed. Alternative approaches such as conventional surgical aortic valve replacement, transcatheter aortic valve replacement, and palliative medical therapy were compared and contrasted at length. This discussion was placed in the context of the patient's own specific clinical presentation and past medical history. All of their questions have been addressed.  Following the decision to proceed with transcatheter aortic valve replacement,  a discussion was held regarding what types of management strategies would be attempted intraoperatively in the event of life-threatening complications, including whether or not the patient would be considered a candidate for the use of cardiopulmonary bypass and/or conversion to open sternotomy for attempted surgical intervention. The patient is aware of the fact that transient use of cardiopulmonary bypass may be necessary.  I would not consider this patient a candidate for redo sternotomy to manage any intraoperative complications given his advanced age and significant disability due to degenerative spine disease.  I discussed this with him and his son and they understand that.  The patient has been advised of a variety of complications that might develop including but not limited to risks of death, stroke, paravalvular leak, aortic dissection or other major vascular complications, aortic annulus rupture, device embolization, cardiac rupture or perforation, mitral regurgitation, acute myocardial infarction, arrhythmia, heart block or bradycardia requiring permanent pacemaker placement, congestive heart failure, respiratory failure, renal failure, pneumonia, infection, other late complications related to structural valve deterioration or migration, or other complications that might ultimately cause a temporary or permanent loss of functional independence or other long term morbidity. The patient provides full informed consent for the procedure as described and all questions were answered.  He had significant orthostatic blood pressure changes during his physical therapy evaluation today and his blood pressure was still on the low side in our office at 90/60.  He said that he was asymptomatic.  I asked him to decrease his Demadex to 20 mg once daily and to discontinue his lisinopril for now.  He has no peripheral edema and may be getting somewhat dehydrated.  I also discussed being careful with bending over or  changing position from supine or sitting to standing.  Plan:  He will be scheduled for transfemoral transcatheter aortic valve replacement on 12/05/2018. He should stop his Eliquis 5 days preoperatively.   I spent 60 minutes performing this consultation and > 50% of this time was spent face to face counseling and coordinating the care of this patient's severe symptomatic aortic stenosis.  Gaye Pollack, MD 11/14/2018 4:35 PM

## 2018-11-14 NOTE — Therapy (Signed)
[place], [place], [address] Phone: [phone]   Fax:  [phone]  Physical Therapy Evaluation/TAVR Evaluation  Patient Details  Name: Charles [name] MRN: [id number] Date of Birth: [birth year] Referring Provider (PT): Sherren Mocha, MD   Encounter Date: 11/14/2018  PT End of Session - 11/14/18 1519    Visit Number  1    PT Start Time  1287    PT Stop Time  1517    PT Time Calculation (min)  59 min    Activity Tolerance  Treatment limited secondary to medical complications (Comment)    Behavior During Therapy  [facility] for tasks assessed/performed       Past Medical History:  Diagnosis Date  . Abdominal aortic atherosclerosis ([place])    by xray  . Aortic stenosis 06/2014   moderate-severe  . Arthritis    in lower back (06/24/2015)  . Atrial flutter ([place])    notes 06/24/2015  . AV block, [place] 1 09/06/2017   Archie Endo 09/06/2017  . 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 CAD (coronary artery disease) 2004   a. s/p CABG in 2004 with LIMA-LAD, SVG-RCA, SVG-OM, and SVG-RI  . Carotid stenosis 1999   s/p L CEA  . CHF (congestive heart failure) ([place]) dx'd 06/2015   a. EF 55-60% by echo in 2016. b. 10/2016: EF reduced to 30-35% by TEE in the setting of atrial fibrillation with RVR  . Colon polyp 2005   (Dr. Tiffany Kocher)  . Compression fracture of L1 lumbar vertebra (HCC) remote  . COPD (chronic obstructive pulmonary disease) ([place]) 03/2011   by xray  . Depression    hx  . Ex-smoker   . GERD (gastroesophageal reflux disease) 2003   h/o duodenal ulcer per EGD as well as esophagitis  . History of colon polyps 2003, 2005   adenomatous Vira Agar)  . HLD (hyperlipidemia)   . HTN (hypertension)   . Psoriasis   . Renal artery stenosis ([place]) 2004   70% bilateral, followed by cards  . T2DM (type 2 diabetes mellitus) ([place]) 1995  . Urge incontinence of urine     Past Surgical History:   Procedure Laterality Date  . CARDIAC CATHETERIZATION  03/06/2003   No intervention - recommend CABG  . CARDIOVASCULAR STRESS TEST  11/2010   normal perfusion, no evidence of ischemia, EF 62% post exercise  . CARDIOVASCULAR STRESS TEST  11/28/2012   Mild diaphragmatic attenuation; cannot exclude a focal region of nontransmural inferior scar  . CARDIOVERSION N/A 06/25/2015   Procedure: CARDIOVERSION;  Surgeon: Pixie Casino, MD;  Location: [facility] ENDOSCOPY;  Service: Cardiovascular;  Laterality: N/A;  . CARDIOVERSION N/A 11/02/2016   Procedure: CARDIOVERSION;  Surgeon: [name], MD;  Location: [place];  Service: Cardiovascular;  Laterality: N/A;  . CAROTID ENDARTERECTOMY Left 1999   ([place])  . CATARACT EXTRACTION W/ INTRAOCULAR LENS  IMPLANT, BILATERAL Bilateral 01/2013   [place]  . COLONOSCOPY  2003   colon polyp x3 - adenomatous Tiffany Kocher)  . COLONOSCOPY  10/08/2012   2 TA, diverticulosis, int hem, no rpt rec Tiffany Kocher)  . CORONARY ANGIOPLASTY    . CORONARY ARTERY BYPASS GRAFT  03/07/2003   4v CABG ([place]) with LIMA to LAD, vein graft to RCA, 1st obtuse marginal, and ramus intermedius  . ESOPHAGOGASTRODUODENOSCOPY  10/08/2012   nl esophagus, duodenitis and erosive gastropathy, path - gastropathy no Hpylori, no rpt rec  . LUMBAR EPIDURAL INJECTION  03/2017   L5/S1 (  [place])  . PACEMAKER IMPLANT N/A 04/21/2018   Procedure: PACEMAKER IMPLANT -- Dual Chamber;  Surgeon: Deboraha Sprang, MD;  Location: [place] CV LAB;  Service: Cardiovascular;  Laterality: N/A;  . PERIPHERAL VASCULAR CATHETERIZATION N/A 02/02/2016   Procedure: Renal Angiography;  Surgeon: [name], MD;  Location: [place] CV LAB;  Service: Cardiovascular;  Laterality: N/A;  . PERIPHERAL VASCULAR CATHETERIZATION N/A 02/02/2016   Procedure: Abdominal Aortogram;  Surgeon: [name], MD;  Location: [place] [facility] LAB;  Service: Cardiovascular;  Laterality: N/A;  . RENAL DOPPLER  11/29/2011   Celiac&SMA-demonstrated  vessel narrowing suggestive of a greater than 50% diameter reduction. Bilateral renal arteries-demonstrated vessel narrowing of 60-99% diameter reduction. Rt Kidney-mid pole lateral simple cyst noted measuring 1.29x0.76x1.11cm and exophytic cyst outside lower pole measuring 1.23x0.96x1.31. Lft Kidney-lateral mid to lower pole simple cyst measuering-1.24x9.83x1.24  . RIGHT/LEFT HEART CATH AND CORONARY/GRAFT ANGIOGRAPHY N/A 10/20/2017   Procedure: RIGHT/LEFT HEART CATH AND CORONARY/GRAFT ANGIOGRAPHY;  Surgeon: Burnell Blanks, MD;  Location: [place] [facility] LAB;  Service: Cardiovascular;  Laterality: N/A;  . TEE WITHOUT CARDIOVERSION N/A 06/25/2015   Procedure: TRANSESOPHAGEAL ECHOCARDIOGRAM (TEE);  Surgeon: Pixie Casino, MD;  Location: [facility] ENDOSCOPY;  Service: Cardiovascular;  Laterality: N/A;  . TEE WITHOUT CARDIOVERSION N/A 11/02/2016   Procedure: TRANSESOPHAGEAL ECHOCARDIOGRAM (TEE);  Surgeon: [name], MD;  Location: [facility] ENDOSCOPY;  Service: Cardiovascular;  Laterality: N/A;  . TONSILLECTOMY    . UPPER GASTROINTESTINAL ENDOSCOPY  2003   reflux esophagitis, erosive gastropathy, duodenal ulcer    There were no vitals filed for this visit.   Subjective Assessment - 11/14/18 1423    Subjective  If I make a quick move I get winded. My legs seem like they are getting weaker, denies N/T. Has been about a month that I have been feeling winded. I have had pain in my back for years. Had a cup of coffee and a biscuit today, no water.     How long can you stand comfortably?  5-10 min    How long can you walk comfortably?  30 ft    Currently in Pain?  No/denies         [facility] PT Assessment - 11/14/18 0001      Assessment   Medical Diagnosis  severe aortic stenosis    Referring Provider (PT)  Sherren Mocha, MD    Onset Date/Surgical Date  --   1 month   Hand Dominance  Right      Precautions   Precautions  None      Restrictions   Weight Bearing Restrictions  No      Balance  Screen   Has the patient fallen in the past 6 months  No      [place] residence    Living Arrangements  Alone    Additional Comments  avoids stairs      Prior Function   Level of [place]  Retired      Associate Professor   Overall Cognitive Status  Within Functional Limits for tasks assessed      Sensation   Additional Comments  [facility]      Posture/Postural Control   Posture Comments  flexed at waist, kyphotic, forward head      ROM / Strength   AROM / PROM / Strength  AROM;Strength      AROM   Overall AROM Comments  [facility]  Strength   Overall Strength Comments  Rt foot DF 4/5 otherwise gross 5/5    Strength Assessment Site  Hand    Right/Left hand  Right;Left    Right Hand Grip (lbs)  60 60 50    Left Hand Grip (lbs)  60 60 60      Ambulation/Gait   Assistive device  4-wheeled walker    Gait Comments  shuffling gait, flexed posture      6 Minute Walk- Baseline   6 Minute Walk- Baseline  yes    02 Sat (%RA)  95 %    Modified Borg Scale for Dyspnea  0- Nothing at all    Perceived Rate of Exertion (Borg)  6-       [facility] Pre-Surgical Assessment - 11/14/18 0001    5 Meter Walk Test- trial 1  5 sec    5 Meter Walk Test- trial 2  4 sec.     5 Meter Walk Test- trial 3  4 sec.    5 meter walk test average  4.33 sec    4 Stage Balance Test Position  1    Sit To Stand Test- trial 1  24 sec.    Comment  max use of bil UE    BP (mmHg)  (!) 87/64   106/73 taken after other measurements   HR (bpm)  89   up to 97 at later measure   6 Minute Walk Post Test  yes    BP (mmHg)  (!) 60/48   98/68 after 10 min and 2 cups of water   HR (bpm)  116   down to 93 at next reading   02 Sat (%RA)  95 %    Modified Borg Scale for Dyspnea  3- Moderate shortness of breath or breathing difficulty    Perceived Rate of Exertion (Borg)  14-    Aerobic Endurance Distance Walked  367    Endurance additional comments  2 rest breaks               Objective measurements completed on examination: See above findings.                           Plan - 11/14/18 1519    PT Frequency  --   one time pre-TAVR evaluation   Consulted and Agree with Plan of Care  Patient;Family member/caregiver    Family Member Consulted  Son       Clinical Impression Statement: Pt is a 85 yo M presenting to OP PT for evaluation prior to possible TAVR surgery due to severe aortic stenosis. Pt reports onset of SOB about 1 month ago. Symptoms are  limiting functional ambulation for community distances. Pt presents with WFL ROM and strength and occasional low back musculoskeletal pain.  Pt ambulated 185 feet in 2:07 before requesting a seated rest beak lasting 53s. At time of rest, patient's HR was 117 bpm and O2 was 96 on room air.  Pt able to resume after rest and ambulate an additional 76 feet before requesting a seated rest break lasting 1 min and then was able to complete the walk.Pt reported 3/10 shortness of breath on modified scale for dyspnea.Pt ambulated a total of 367 feet in 6 minute walk and reported 3/10 SOB on modified scale for dyspena and 14/20 RPE on Borg's perceived exertion and pain scale at the end of the walk. During the 6 minute walk test, patient's  HR increased to 117 BPM and O2 saturation decreased to 94%. Based on the Short Physical Performance Battery, patient has a frailty rating of 5/12 with </= 5/12 considered frail.  Extended time required in clinic due to BP reading of 60/48 immediately following 6MWT. Pt has only had a cup of coffee and a biscuit today. HR and O2 changed from 116 BPM and 95% to 93 BP and 97% in this rest time and BP increased to 98/68. Pt denied any HA, chest pain, nausea or dizziness. Pt has next appointment at 4:30 today with surgeon and is with his son. Advised to immediately present to ED if he has any symptoms to which son and pt verbalized understanding. Encouraged him to get  some food and water as well.   Visit Diagnosis: Difficulty in walking, not elsewhere classified     Problem List Patient Active Problem List   Diagnosis Date Noted  . 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 AV block, 2nd degree 04/21/2018  . Elevated troponin 03/25/2018  . Heart block 03/25/2018  . Atrial flutter ([place])   . Scrotal swelling 02/16/2018  . Coronary artery disease involving native coronary artery of native heart without angina pectoris   . Acute on chronic diastolic CHF (congestive heart failure) ([place]) 09/06/2017  . Renal insufficiency 07/01/2017  . Urinary urgency 07/01/2017  . Prostatitis 01/28/2017  . Exertional dyspnea 01/28/2017  . Gross hematuria 11/25/2016  . Chronic systolic congestive heart failure ([place]) 11/16/2016  . Chronic anticoagulation 11/05/2016  . Bradycardia 10/28/2016  . Chronic radicular pain of lower back 02/17/2016  . Typical atrial flutter ([place]) 06/11/2015  . Advanced care planning/counseling discussion 10/08/2014  . Health maintenance examination 10/08/2014  . Aortic stenosis, severe 06/04/2014  . DDD (degenerative disc disease), lumbar 10/02/2013  . Medicare annual wellness visit, subsequent 10/02/2013  . Ex-smoker   . Carotid arterial disease ([place]) 08/23/2013  . Renal artery stenosis ([place]) 08/23/2013  . MDD (major depressive disorder), recurrent episode, moderate ([place]) 09/21/2007  . OSTEOARTHRITIS 09/21/2007  . COLONIC POLYPS 01/31/2007  . Diabetes mellitus type 2, uncontrolled, with complications ([place]) 15/94/5859  . HYPERCHOLESTEROLEMIA 01/31/2007  . ERECTILE DYSFUNCTION 01/31/2007  . Essential hypertension 01/31/2007  . Coronary atherosclerosis 01/31/2007  . Cardiovascular disease 01/31/2007  . ALLERGIC RHINITIS 01/31/2007  . GERD 01/31/2007  . PSORIASIS 01/31/2007    Jazen Spraggins C. Keonte Daubenspeck PT, DPT 11/14/18 3:30 PM   [facility] [address] [place], [place], [address] Phone: [phone]   Fax:  [phone]  Name: Charles [name] MRN: [id number] Date of Birth: 06-14-1932

## 2018-11-15 ENCOUNTER — Telehealth: Payer: Self-pay | Admitting: Family Medicine

## 2018-11-15 NOTE — Telephone Encounter (Signed)
E-scribed refill.  Please r/s annual wellness visit.

## 2018-11-15 NOTE — Telephone Encounter (Signed)
Noted  

## 2018-11-15 NOTE — Telephone Encounter (Signed)
Appointment 5/21 For medicare wellness with lisa and cpx with dr g

## 2018-11-16 ENCOUNTER — Ambulatory Visit (INDEPENDENT_AMBULATORY_CARE_PROVIDER_SITE_OTHER): Payer: Medicare Other

## 2018-11-16 DIAGNOSIS — I441 Atrioventricular block, second degree: Secondary | ICD-10-CM | POA: Diagnosis not present

## 2018-11-16 DIAGNOSIS — M5136 Other intervertebral disc degeneration, lumbar region: Secondary | ICD-10-CM | POA: Diagnosis not present

## 2018-11-17 LAB — CUP PACEART REMOTE DEVICE CHECK
Battery Remaining Longevity: 123 mo
Battery Voltage: 3.02 V
Brady Statistic AP VP Percent: 1.6 %
Brady Statistic AS VP Percent: 98 %
Brady Statistic AS VS Percent: 1 %
Brady Statistic RA Percent Paced: 1.6 %
Brady Statistic RV Percent Paced: 99 %
Implantable Lead Implant Date: 20190719
Implantable Lead Location: 753860
Implantable Lead Model: 5076
Implantable Lead Model: 5076
Implantable Pulse Generator Implant Date: 20190719
Lead Channel Impedance Value: 490 Ohm
Lead Channel Impedance Value: 490 Ohm
Lead Channel Pacing Threshold Amplitude: 0.5 V
Lead Channel Pacing Threshold Pulse Width: 0.5 ms
Lead Channel Pacing Threshold Pulse Width: 0.5 ms
Lead Channel Sensing Intrinsic Amplitude: 12 mV
Lead Channel Setting Pacing Amplitude: 0.75 V
Lead Channel Setting Pacing Amplitude: 1.5 V
Lead Channel Setting Pacing Pulse Width: 0.5 ms
Lead Channel Setting Sensing Sensitivity: 2 mV
MDC IDC LEAD IMPLANT DT: 20190719
MDC IDC LEAD LOCATION: 753859
MDC IDC MSMT BATTERY REMAINING PERCENTAGE: 95.5 %
MDC IDC MSMT LEADCHNL RA SENSING INTR AMPL: 5 mV
MDC IDC MSMT LEADCHNL RV PACING THRESHOLD AMPLITUDE: 0.5 V
MDC IDC SESS DTM: 20200213070013
MDC IDC STAT BRADY AP VS PERCENT: 1 %
Pulse Gen Model: 2272
Pulse Gen Serial Number: 9040268

## 2018-11-27 ENCOUNTER — Other Ambulatory Visit: Payer: Self-pay

## 2018-11-27 DIAGNOSIS — I350 Nonrheumatic aortic (valve) stenosis: Secondary | ICD-10-CM

## 2018-11-28 NOTE — Progress Notes (Signed)
Remote pacemaker transmission.   

## 2018-11-30 NOTE — Pre-Procedure Instructions (Signed)
Charles [name]  11/30/2018     Your procedure is scheduled on Tuesday, March 3..  Report to [facility] Admitting at 8:45 AM               Your surgery or procedure is scheduled for   10:45 A.M.   Call this number if you have problems the morning of surgery: [phone]  This is the number for the Pre- Surgical Desk.    Remember:  Do not eat or drink after midnight Monday, March 3.    Take these medicines the morning of surgery with A SIP OF WATER: None Eliquis stopped on 11/30/2018-  Stop Metformin on 12/03/2018  1/2 dose Insulin evening Monday - 10 units Lantus    How to Manage Your Diabetes Before and After Surgery  Why is it important to control my blood sugar before and after surgery? . Improving blood sugar levels before and after surgery helps healing and can limit problems. . A way of improving blood sugar control is eating a healthy diet by: o  Eating less sugar and carbohydrates o  Increasing activity/exercise o  Talking with your doctor about reaching your blood sugar goals . High blood sugars (greater than 180 mg/dL) can raise your risk of infections and slow your recovery, so you will need to focus on controlling your diabetes during the weeks before surgery. . Make sure that the doctor who takes care of your diabetes knows about your planned surgery including the date and location.  How do I manage my blood sugar before surgery? . Check your blood sugar at least 4 times a day, starting 2 days before surgery, to make sure that the level is not too high or low. o Check your blood sugar the morning of your surgery when you wake up and every 2 hours until you get to the Short Stay unit. . If your blood sugar is less than 70 mg/dL, you will need to treat for low blood sugar: o Do not take insulin. o Treat a low blood sugar (less than 70 mg/dL) with  cup of clear juice (cranberry or apple), 4 glucose tablets, OR glucose gel. Recheck blood sugar in 15 minutes  after treatment (to make sure it is greater than 70 mg/dL). If your blood sugar is not greater than 70 mg/dL on recheck, call [phone] o  for further instructions. . Report your blood sugar to the short stay nurse when you get to Short Stay.  . If you are admitted to the hospital after surgery: o Your blood sugar will be checked by the staff and you will probably be given insulin after surgery (instead of oral diabetes medicines) to make sure you have good blood sugar levels. o The goal for blood sugar control after surgery is 80-180 mg/dL.          WHAT DO I DO ABOUT MY DIABETES MEDICATION?   [name] Do not take oral diabetes medicines (pills) the morning of surgery - Metformin stopped on 12/03/2018  . THE NIGHT BEFORE SURGERY, take 10 units of _Lantus_insulin.      Special instructions:   [name]- Preparing For Surgery  Before surgery, you can play an important role. Because skin is not sterile, your skin needs to be as free of germs as possible. You can reduce the number of germs on your skin by washing with CHG (chlorahexidine gluconate) Soap before surgery.  CHG is an antiseptic cleaner which kills germs and bonds  with the skin to continue killing germs even after washing.    Oral Hygiene is also important to reduce your risk of infection.  Remember - BRUSH YOUR TEETH THE MORNING OF SURGERY WITH YOUR REGULAR TOOTHPASTE  Please do not use if you have an allergy to CHG or antibacterial soaps. If your skin becomes reddened/irritated stop using the CHG.  Do not shave (including legs and underarms) for at least 48 hours prior to first CHG shower. It is OK to shave your face.  Please follow these instructions carefully.   1. Shower the NIGHT BEFORE SURGERY and the MORNING OF SURGERY with CHG.   2. If you chose to wash your hair, wash your hair first as usual with your normal shampoo.  3. After you shampoo, rinse your hair and body thoroughly to remove the shampoo.  4. Use CHG  as you would any other liquid soap. You can apply CHG directly to the skin and wash gently with a scrungie or a clean washcloth.   5. Apply the CHG Soap to your body ONLY FROM THE NECK DOWN.  Do not use on open wounds or open sores. Avoid contact with your eyes, ears, mouth and genitals (private parts). Wash Face and genitals (private parts)  with your normal soap.  6. Wash thoroughly, paying special attention to the area where your surgery will be performed.  7. Thoroughly rinse your body with warm water from the neck down.  8. DO NOT shower/wash with your normal soap after using and rinsing off the CHG Soap.  9. Pat yourself dry with a CLEAN TOWEL.  10. Wear CLEAN PAJAMAS to bed the night before surgery, wear comfortable clothes the morning of surgery  11. Place CLEAN SHEETS on your bed the night of your first shower and DO NOT SLEEP WITH PETS.  Day of Surgery: Shower as written above  Do not apply any deodorants/lotions.  Please wear clean clothes to the hospital/surgery center.   Remember to brush your teeth WITH YOUR REGULAR TOOTHPASTE.  Do not wear jewelry, make-up or nail polish.  Do not wear powders, or perfumes.              Do not shave 48 hours prior to surgery.  Men may shave face and neck.  Do not bring valuables to the hospital.  [facility] is not responsible for any belongings or valuables.  Contacts, dentures or bridgework may not be worn into surgery.  Leave your suitcase in the car.  After surgery it may be brought to your room.  For patients admitted to the hospital, discharge time will be determined by your treatment team.  Patients discharged the day of surgery will not be allowed to drive home.   Please read over the following fact sheets that you were given:  Managing Pain, Surgical Site Infection, Coughing and Deep Breathing

## 2018-11-30 NOTE — Progress Notes (Signed)
PCP - Dr. [name]  Cardiologist - Dr.  Alvester Chou  Chest x-ray -   EKG -   Stress Test - 2014  ECHO - 09/05/2018  Cardiac Cath - 2019   Sleep Study -  CPAP -   LABS-  ASA-  HA1C- Fasting Blood Sugar -  Checks Blood Sugar _____ times a day  Anesthesia-  Pt denies having chest pain, sob, or fever at this time. All instructions explained to the pt, with a verbal understanding of the material. Pt agrees to go over the instructions while at home for a better understanding. The opportunity to ask questions was provided.

## 2018-12-01 ENCOUNTER — Other Ambulatory Visit: Payer: Self-pay

## 2018-12-01 ENCOUNTER — Encounter (HOSPITAL_COMMUNITY): Payer: Self-pay

## 2018-12-01 ENCOUNTER — Encounter (HOSPITAL_COMMUNITY)
Admission: RE | Admit: 2018-12-01 | Discharge: 2018-12-01 | Disposition: A | Discharge status: Home / Self Care | Payer: Medicare Other | Source: Ambulatory Visit | Attending: Cardiovascular Disease | Admitting: Cardiovascular Disease

## 2018-12-01 DIAGNOSIS — R918 Other nonspecific abnormal finding of lung field: Secondary | ICD-10-CM | POA: Diagnosis not present

## 2018-12-01 DIAGNOSIS — Z950 Presence of cardiac pacemaker: Secondary | ICD-10-CM | POA: Diagnosis not present

## 2018-12-01 DIAGNOSIS — Z01818 Encounter for other preprocedural examination: Secondary | ICD-10-CM | POA: Diagnosis not present

## 2018-12-01 DIAGNOSIS — I350 Nonrheumatic aortic (valve) stenosis: Secondary | ICD-10-CM | POA: Diagnosis not present

## 2018-12-01 HISTORY — DX: Presence of cardiac pacemaker: Z95.0

## 2018-12-01 LAB — TYPE AND SCREEN
ABO/RH(D): B POS
Antibody Screen: NEGATIVE

## 2018-12-01 LAB — URINALYSIS, ROUTINE W REFLEX MICROSCOPIC
Bacteria, UA: NONE SEEN
Bilirubin Urine: NEGATIVE
GLUCOSE, UA: NEGATIVE mg/dL
Ketones, ur: NEGATIVE mg/dL
Nitrite: NEGATIVE
Protein, ur: NEGATIVE mg/dL
Specific Gravity, Urine: 1.006 (ref 1.005–1.030)
pH: 5 (ref 5.0–8.0)

## 2018-12-01 LAB — COMPREHENSIVE METABOLIC PANEL
ALT: 18 U/L (ref 0–44)
AST: 24 U/L (ref 15–41)
Albumin: 3.8 g/dL (ref 3.5–5.0)
Alkaline Phosphatase: 62 U/L (ref 38–126)
Anion gap: 16 — ABNORMAL HIGH (ref 5–15)
BUN: 17 mg/dL (ref 8–23)
CO2: 19 mmol/L — ABNORMAL LOW (ref 22–32)
Calcium: 9.4 mg/dL (ref 8.9–10.3)
Chloride: 104 mmol/L (ref 98–111)
Creatinine, Ser: 1.25 mg/dL — ABNORMAL HIGH (ref 0.61–1.24)
GFR calc non Af Amer: 53 mL/min — ABNORMAL LOW (ref >60)
Glucose, Bld: 121 mg/dL — ABNORMAL HIGH (ref 70–99)
Potassium: 4.7 mmol/L (ref 3.5–5.1)
SODIUM: 139 mmol/L (ref 135–145)
Total Bilirubin: 0.7 mg/dL (ref 0.3–1.2)
Total Protein: 7.1 g/dL (ref 6.5–8.1)

## 2018-12-01 LAB — GLUCOSE, CAPILLARY: Glucose-Capillary: 140 mg/dL — ABNORMAL HIGH (ref 70–99)

## 2018-12-01 LAB — PROTIME-INR
INR: 1 (ref 0.8–1.2)
Prothrombin Time: 13.4 seconds (ref 11.4–15.2)

## 2018-12-01 LAB — BLOOD GAS, ARTERIAL
ACID-BASE EXCESS: 0.1 mmol/L (ref 0.0–2.0)
BICARBONATE: 24.2 mmol/L (ref 20.0–28.0)
Drawn by: 470591
FIO2: 21
O2 Saturation: 98 %
Patient temperature: 98.6
pCO2 arterial: 38.6 mmHg (ref 32.0–48.0)
pH, Arterial: 7.413 (ref 7.350–7.450)
pO2, Arterial: 103 mmHg (ref 83.0–108.0)

## 2018-12-01 LAB — CBC
HCT: 40.8 % (ref 39.0–52.0)
Hemoglobin: 12.8 g/dL — ABNORMAL LOW (ref 13.0–17.0)
MCH: 30.2 pg (ref 26.0–34.0)
MCHC: 31.4 g/dL (ref 30.0–36.0)
MCV: 96.2 fL (ref 80.0–100.0)
Platelets: 174 10*3/uL (ref 150–400)
RBC: 4.24 MIL/uL (ref 4.22–5.81)
RDW: 13.1 % (ref 11.5–15.5)
WBC: 7.4 10*3/uL (ref 4.0–10.5)
nRBC: 0 % (ref 0.0–0.2)

## 2018-12-01 LAB — HEMOGLOBIN A1C
Hgb A1c MFr Bld: 8.9 % — ABNORMAL HIGH (ref 4.8–5.6)
Mean Plasma Glucose: 208.73 mg/dL

## 2018-12-01 LAB — SURGICAL PCR SCREEN
MRSA, PCR: NEGATIVE
Staphylococcus aureus: NEGATIVE

## 2018-12-01 LAB — BRAIN NATRIURETIC PEPTIDE: B NATRIURETIC PEPTIDE 5: 411.5 pg/mL — AB (ref 0.0–100.0)

## 2018-12-01 LAB — ABO/RH: ABO/RH(D): B POS

## 2018-12-01 LAB — APTT: aPTT: 32 seconds (ref 24–36)

## 2018-12-01 NOTE — Progress Notes (Addendum)
PCP - Dr. [name]  Cardiologist - Dr.  Alvester Chou  Chest x-ray - 12/01/2018  EKG - 12/01/2018  Stress Test - 2014  ECHO - 09/05/2018  Cardiac Cath - 2019   Sleep Study - no CPAP - no  LABS- Message was left for Theodosia Quay regarding urine- negative bacteria, moderate Leukocytes.  ASA-no Instructed to hold Eliquis starting 11/28/2018- daughter in saw reported. Instructed to hold Metformin starting 12/03/2018-  Daughter in law reported. HA1C-12/01/2018 Fasting Blood Sugar - varies 70- 200 Checks Blood Sugar __1___ times a day  Anesthesia-  Pt denies having chest pain, or fever at this time.  Patient is short of breath with much activity, returns to normal after sitting a few minutes.All instructions explained to the pt, and his daughter In law, with a verbal understanding of the material. Pt agrees to go over the instructions while at home for a better understanding. The opportunity to ask questions was provided.  Mr Eppinger has a [employer] Pacemaker, I faxed perio op device order request to the device clinic.  I emailed [employer] representives and Jovita Kussmaul, Therapist, sports

## 2018-12-04 MED ORDER — POTASSIUM CHLORIDE 2 MEQ/ML IV SOLN
80.0000 meq | INTRAVENOUS | Status: DC
  Filled 2018-12-04: qty 40

## 2018-12-04 MED ORDER — SODIUM CHLORIDE 0.9 % IV SOLN
1.5000 g | INTRAVENOUS | Status: AC
  Administered 2018-12-05: 1.5 g via INTRAVENOUS
  Filled 2018-12-04 (×2): qty 1.5

## 2018-12-04 MED ORDER — NOREPINEPHRINE BITARTRATE 1 MG/ML IV SOLN
0.0000 ug/min | INTRAVENOUS | Status: DC
  Filled 2018-12-04: qty 4

## 2018-12-04 MED ORDER — SODIUM CHLORIDE 0.9 % IV SOLN
INTRAVENOUS | Status: DC
  Filled 2018-12-04: qty 30

## 2018-12-04 MED ORDER — DEXMEDETOMIDINE HCL IN NACL 400 MCG/100ML IV SOLN
0.1000 ug/kg/h | INTRAVENOUS | Status: AC
  Administered 2018-12-05: 1 ug/kg/h via INTRAVENOUS
  Filled 2018-12-04: qty 100

## 2018-12-04 MED ORDER — VANCOMYCIN HCL 10 G IV SOLR
1500.0000 mg | INTRAVENOUS | Status: AC
  Administered 2018-12-05: 1500 mg via INTRAVENOUS
  Filled 2018-12-04: qty 1500

## 2018-12-04 MED ORDER — MAGNESIUM SULFATE 50 % IJ SOLN
40.0000 meq | INTRAMUSCULAR | Status: DC
  Filled 2018-12-04: qty 9.85

## 2018-12-05 ENCOUNTER — Inpatient Hospital Stay (HOSPITAL_COMMUNITY): Payer: Medicare Other

## 2018-12-05 ENCOUNTER — Inpatient Hospital Stay (HOSPITAL_COMMUNITY): Payer: Medicare Other | Admitting: Certified Registered Nurse Anesthetist

## 2018-12-05 ENCOUNTER — Encounter (HOSPITAL_COMMUNITY): Payer: Self-pay | Admitting: Certified Registered Nurse Anesthetist

## 2018-12-05 ENCOUNTER — Encounter (HOSPITAL_COMMUNITY)
Admission: RE | Disposition: A | Discharge status: Home / Self Care | Payer: Self-pay | Source: Home / Self Care | Attending: Cardiovascular Disease

## 2018-12-05 ENCOUNTER — Inpatient Hospital Stay (HOSPITAL_COMMUNITY)
Admission: RE | Admit: 2018-12-05 | Discharge: 2018-12-07 | DRG: 267 | Disposition: A | Discharge status: Home / Self Care | Payer: Medicare Other | Attending: Cardiovascular Disease | Admitting: Cardiovascular Disease

## 2018-12-05 ENCOUNTER — Other Ambulatory Visit: Payer: Self-pay | Admitting: Physician Assistant

## 2018-12-05 ENCOUNTER — Other Ambulatory Visit: Payer: Self-pay

## 2018-12-05 DIAGNOSIS — L409 Psoriasis, unspecified: Secondary | ICD-10-CM | POA: Diagnosis present

## 2018-12-05 DIAGNOSIS — K209 Esophagitis, unspecified: Secondary | ICD-10-CM | POA: Diagnosis present

## 2018-12-05 DIAGNOSIS — I5022 Chronic systolic (congestive) heart failure: Secondary | ICD-10-CM | POA: Diagnosis not present

## 2018-12-05 DIAGNOSIS — E118 Type 2 diabetes mellitus with unspecified complications: Secondary | ICD-10-CM

## 2018-12-05 DIAGNOSIS — Z950 Presence of cardiac pacemaker: Secondary | ICD-10-CM | POA: Diagnosis not present

## 2018-12-05 DIAGNOSIS — I441 Atrioventricular block, second degree: Secondary | ICD-10-CM | POA: Diagnosis present

## 2018-12-05 DIAGNOSIS — K5730 Diverticulosis of large intestine without perforation or abscess without bleeding: Secondary | ICD-10-CM | POA: Diagnosis not present

## 2018-12-05 DIAGNOSIS — IMO0002 Reserved for concepts with insufficient information to code with codable children: Secondary | ICD-10-CM | POA: Diagnosis present

## 2018-12-05 DIAGNOSIS — I350 Nonrheumatic aortic (valve) stenosis: Secondary | ICD-10-CM

## 2018-12-05 DIAGNOSIS — Z006 Encounter for examination for normal comparison and control in clinical research program: Secondary | ICD-10-CM

## 2018-12-05 DIAGNOSIS — I2510 Atherosclerotic heart disease of native coronary artery without angina pectoris: Secondary | ICD-10-CM | POA: Diagnosis present

## 2018-12-05 DIAGNOSIS — R001 Bradycardia, unspecified: Secondary | ICD-10-CM | POA: Diagnosis present

## 2018-12-05 DIAGNOSIS — K219 Gastro-esophageal reflux disease without esophagitis: Secondary | ICD-10-CM | POA: Diagnosis not present

## 2018-12-05 DIAGNOSIS — I5032 Chronic diastolic (congestive) heart failure: Secondary | ICD-10-CM | POA: Diagnosis present

## 2018-12-05 DIAGNOSIS — E278 Other specified disorders of adrenal gland: Secondary | ICD-10-CM | POA: Diagnosis not present

## 2018-12-05 DIAGNOSIS — I272 Pulmonary hypertension, unspecified: Secondary | ICD-10-CM | POA: Diagnosis present

## 2018-12-05 DIAGNOSIS — I110 Hypertensive heart disease with heart failure: Principal | ICD-10-CM | POA: Diagnosis present

## 2018-12-05 DIAGNOSIS — M791 Myalgia, unspecified site: Secondary | ICD-10-CM | POA: Diagnosis present

## 2018-12-05 DIAGNOSIS — Z7901 Long term (current) use of anticoagulants: Secondary | ICD-10-CM

## 2018-12-05 DIAGNOSIS — J449 Chronic obstructive pulmonary disease, unspecified: Secondary | ICD-10-CM | POA: Diagnosis present

## 2018-12-05 DIAGNOSIS — Z79899 Other long term (current) drug therapy: Secondary | ICD-10-CM

## 2018-12-05 DIAGNOSIS — I4892 Unspecified atrial flutter: Secondary | ICD-10-CM | POA: Diagnosis not present

## 2018-12-05 DIAGNOSIS — I480 Paroxysmal atrial fibrillation: Secondary | ICD-10-CM | POA: Diagnosis present

## 2018-12-05 DIAGNOSIS — I10 Essential (primary) hypertension: Secondary | ICD-10-CM | POA: Diagnosis present

## 2018-12-05 DIAGNOSIS — I5033 Acute on chronic diastolic (congestive) heart failure: Secondary | ICD-10-CM | POA: Diagnosis present

## 2018-12-05 DIAGNOSIS — E785 Hyperlipidemia, unspecified: Secondary | ICD-10-CM | POA: Diagnosis present

## 2018-12-05 DIAGNOSIS — I779 Disorder of arteries and arterioles, unspecified: Secondary | ICD-10-CM | POA: Diagnosis present

## 2018-12-05 DIAGNOSIS — Z87891 Personal history of nicotine dependence: Secondary | ICD-10-CM

## 2018-12-05 DIAGNOSIS — Z952 Presence of prosthetic heart valve: Secondary | ICD-10-CM

## 2018-12-05 DIAGNOSIS — I739 Peripheral vascular disease, unspecified: Secondary | ICD-10-CM

## 2018-12-05 DIAGNOSIS — Z8711 Personal history of peptic ulcer disease: Secondary | ICD-10-CM

## 2018-12-05 DIAGNOSIS — F331 Major depressive disorder, recurrent, moderate: Secondary | ICD-10-CM | POA: Diagnosis present

## 2018-12-05 DIAGNOSIS — Z800 Family history of malignant neoplasm of digestive organs: Secondary | ICD-10-CM | POA: Diagnosis not present

## 2018-12-05 DIAGNOSIS — E7800 Pure hypercholesterolemia, unspecified: Secondary | ICD-10-CM | POA: Diagnosis present

## 2018-12-05 DIAGNOSIS — Z801 Family history of malignant neoplasm of trachea, bronchus and lung: Secondary | ICD-10-CM

## 2018-12-05 DIAGNOSIS — E1165 Type 2 diabetes mellitus with hyperglycemia: Secondary | ICD-10-CM | POA: Diagnosis present

## 2018-12-05 DIAGNOSIS — Z823 Family history of stroke: Secondary | ICD-10-CM

## 2018-12-05 DIAGNOSIS — Z8719 Personal history of other diseases of the digestive system: Secondary | ICD-10-CM

## 2018-12-05 DIAGNOSIS — Z833 Family history of diabetes mellitus: Secondary | ICD-10-CM

## 2018-12-05 DIAGNOSIS — Z8249 Family history of ischemic heart disease and other diseases of the circulatory system: Secondary | ICD-10-CM

## 2018-12-05 DIAGNOSIS — Z794 Long term (current) use of insulin: Secondary | ICD-10-CM

## 2018-12-05 DIAGNOSIS — I701 Atherosclerosis of renal artery: Secondary | ICD-10-CM | POA: Diagnosis present

## 2018-12-05 DIAGNOSIS — Z951 Presence of aortocoronary bypass graft: Secondary | ICD-10-CM

## 2018-12-05 DIAGNOSIS — I700 Atherosclerosis of aorta: Secondary | ICD-10-CM | POA: Diagnosis not present

## 2018-12-05 HISTORY — PX: TEE WITHOUT CARDIOVERSION: SHX5443

## 2018-12-05 HISTORY — DX: Presence of prosthetic heart valve: Z95.2

## 2018-12-05 HISTORY — DX: Chronic diastolic (congestive) heart failure: I50.32

## 2018-12-05 HISTORY — DX: Nonrheumatic aortic (valve) stenosis: I35.0

## 2018-12-05 HISTORY — PX: TRANSCATHETER AORTIC VALVE REPLACEMENT, TRANSFEMORAL: SHX6400

## 2018-12-05 HISTORY — DX: Personal history of nicotine dependence: Z87.891

## 2018-12-05 LAB — POCT I-STAT 7, (LYTES, BLD GAS, ICA,H+H)
Acid-Base Excess: 2 mmol/L (ref 0.0–2.0)
Bicarbonate: 29.1 mmol/L — ABNORMAL HIGH (ref 20.0–28.0)
Calcium, Ion: 1.24 mmol/L (ref 1.15–1.40)
HCT: 31 % — ABNORMAL LOW (ref 39.0–52.0)
Hemoglobin: 10.5 g/dL — ABNORMAL LOW (ref 13.0–17.0)
O2 Saturation: 99 %
PCO2 ART: 56.9 mmHg — AB (ref 32.0–48.0)
POTASSIUM: 4.6 mmol/L (ref 3.5–5.1)
Sodium: 140 mmol/L (ref 135–145)
TCO2: 31 mmol/L (ref 22–32)
pH, Arterial: 7.318 — ABNORMAL LOW (ref 7.350–7.450)
pO2, Arterial: 177 mmHg — ABNORMAL HIGH (ref 83.0–108.0)

## 2018-12-05 LAB — GLUCOSE, CAPILLARY
GLUCOSE-CAPILLARY: 100 mg/dL — AB (ref 70–99)
Glucose-Capillary: 173 mg/dL — ABNORMAL HIGH (ref 70–99)
Glucose-Capillary: 151 mg/dL — ABNORMAL HIGH (ref 70–99)
Glucose-Capillary: 179 mg/dL — ABNORMAL HIGH (ref 70–99)
Glucose-Capillary: 146 mg/dL — ABNORMAL HIGH (ref 70–99)

## 2018-12-05 LAB — POCT I-STAT 4, (NA,K, GLUC, HGB,HCT)
GLUCOSE: 202 mg/dL — AB (ref 70–99)
Glucose, Bld: 169 mg/dL — ABNORMAL HIGH (ref 70–99)
Glucose, Bld: 181 mg/dL — ABNORMAL HIGH (ref 70–99)
HCT: 31 % — ABNORMAL LOW (ref 39.0–52.0)
HCT: 31 % — ABNORMAL LOW (ref 39.0–52.0)
HEMATOCRIT: 31 % — AB (ref 39.0–52.0)
Hemoglobin: 10.5 g/dL — ABNORMAL LOW (ref 13.0–17.0)
Hemoglobin: 10.5 g/dL — ABNORMAL LOW (ref 13.0–17.0)
Hemoglobin: 10.5 g/dL — ABNORMAL LOW (ref 13.0–17.0)
Potassium: 4 mmol/L (ref 3.5–5.1)
Potassium: 4.4 mmol/L (ref 3.5–5.1)
Potassium: 4.6 mmol/L (ref 3.5–5.1)
Sodium: 141 mmol/L (ref 135–145)
Sodium: 140 mmol/L (ref 135–145)
Sodium: 140 mmol/L (ref 135–145)

## 2018-12-05 LAB — POCT I-STAT CREATININE: Creatinine, Ser: 1 mg/dL (ref 0.61–1.24)

## 2018-12-05 SURGERY — IMPLANTATION, AORTIC VALVE, TRANSCATHETER, FEMORAL APPROACH
Anesthesia: Monitor Anesthesia Care | Site: Chest

## 2018-12-05 MED ORDER — SODIUM CHLORIDE 0.9 % IV BOLUS: 500.0000 mL | Freq: Once | INTRAVENOUS | Status: DC

## 2018-12-05 MED ORDER — SODIUM CHLORIDE 0.9 % IV SOLN
INTRAVENOUS | Status: DC | PRN
  Administered 2018-12-05: 12:00:00

## 2018-12-05 MED ORDER — FENTANYL CITRATE (PF) 250 MCG/5ML IJ SOLN
INTRAMUSCULAR | Status: AC
  Filled 2018-12-05: qty 5

## 2018-12-05 MED ORDER — ONDANSETRON HCL 4 MG/2ML IJ SOLN
INTRAMUSCULAR | Status: AC
  Filled 2018-12-05: qty 4

## 2018-12-05 MED ORDER — DEXAMETHASONE SODIUM PHOSPHATE 10 MG/ML IJ SOLN
INTRAMUSCULAR | Status: AC
  Filled 2018-12-05: qty 1

## 2018-12-05 MED ORDER — CHLORHEXIDINE GLUCONATE 0.12 % MT SOLN
15.0000 mL | Freq: Once | OROMUCOSAL | Status: AC
  Administered 2018-12-05: 15 mL via OROMUCOSAL

## 2018-12-05 MED ORDER — HEPARIN SODIUM (PORCINE) 1000 UNIT/ML IJ SOLN
INTRAMUSCULAR | Status: DC | PRN
  Administered 2018-12-05: 15000 [IU] via INTRAVENOUS

## 2018-12-05 MED ORDER — SODIUM CHLORIDE 0.9 % IV SOLN
INTRAVENOUS | Status: AC
  Filled 2018-12-05 (×3): qty 1.2

## 2018-12-05 MED ORDER — ASPIRIN 81 MG PO CHEW
81.0000 mg | CHEWABLE_TABLET | Freq: Every day | ORAL | Status: DC
  Administered 2018-12-06 – 2018-12-07 (×2): 81 mg via ORAL
  Filled 2018-12-05 (×2): qty 1

## 2018-12-05 MED ORDER — ONDANSETRON HCL 4 MG/2ML IJ SOLN: 4.0000 mg | Freq: Four times a day (QID) | INTRAMUSCULAR | Status: DC | PRN

## 2018-12-05 MED ORDER — 0.9 % SODIUM CHLORIDE (POUR BTL) OPTIME
TOPICAL | Status: DC | PRN
  Administered 2018-12-05: 1000 mL

## 2018-12-05 MED ORDER — FENTANYL CITRATE (PF) 100 MCG/2ML IJ SOLN
INTRAMUSCULAR | Status: AC
  Administered 2018-12-05: 50 ug via INTRAVENOUS
  Filled 2018-12-05: qty 2

## 2018-12-05 MED ORDER — INSULIN ASPART 100 UNIT/ML ~~LOC~~ SOLN
0.0000 [IU] | Freq: Three times a day (TID) | SUBCUTANEOUS | Status: DC
  Administered 2018-12-05: 2 [IU] via SUBCUTANEOUS
  Administered 2018-12-06: 5 [IU] via SUBCUTANEOUS
  Administered 2018-12-06: 3 [IU] via SUBCUTANEOUS
  Administered 2018-12-06: 2 [IU] via SUBCUTANEOUS
  Administered 2018-12-07: 5 [IU] via SUBCUTANEOUS

## 2018-12-05 MED ORDER — FINASTERIDE 5 MG PO TABS
5.0000 mg | ORAL_TABLET | Freq: Every day | ORAL | Status: DC
  Administered 2018-12-05 – 2018-12-07 (×3): 5 mg via ORAL
  Filled 2018-12-05 (×3): qty 1

## 2018-12-05 MED ORDER — SODIUM CHLORIDE 0.9 % IV SOLN
1.5000 g | Freq: Two times a day (BID) | INTRAVENOUS | Status: DC
  Administered 2018-12-05 – 2018-12-06 (×3): 1.5 g via INTRAVENOUS
  Filled 2018-12-05 (×4): qty 1.5

## 2018-12-05 MED ORDER — HEPARIN SODIUM (PORCINE) 1000 UNIT/ML IJ SOLN
INTRAMUSCULAR | Status: AC
  Filled 2018-12-05: qty 2

## 2018-12-05 MED ORDER — CITALOPRAM HYDROBROMIDE 20 MG PO TABS
20.0000 mg | ORAL_TABLET | Freq: Every day | ORAL | Status: DC
  Administered 2018-12-05 – 2018-12-07 (×3): 20 mg via ORAL
  Filled 2018-12-05 (×3): qty 1

## 2018-12-05 MED ORDER — ACETAMINOPHEN 650 MG RE SUPP: 650.0000 mg | Freq: Four times a day (QID) | RECTAL | Status: DC | PRN

## 2018-12-05 MED ORDER — ROCURONIUM BROMIDE 50 MG/5ML IV SOSY
PREFILLED_SYRINGE | INTRAVENOUS | Status: AC
  Filled 2018-12-05: qty 5

## 2018-12-05 MED ORDER — PHENYLEPHRINE HCL-NACL 20-0.9 MG/250ML-% IV SOLN
0.0000 ug/min | INTRAVENOUS | Status: DC
  Filled 2018-12-05: qty 250

## 2018-12-05 MED ORDER — PANTOPRAZOLE SODIUM 20 MG PO TBEC
20.0000 mg | DELAYED_RELEASE_TABLET | Freq: Every day | ORAL | Status: DC
  Administered 2018-12-05 – 2018-12-07 (×3): 20 mg via ORAL
  Filled 2018-12-05 (×3): qty 1

## 2018-12-05 MED ORDER — CHLORHEXIDINE GLUCONATE 4 % EX LIQD: 60.0000 mL | Freq: Once | CUTANEOUS | Status: DC

## 2018-12-05 MED ORDER — TRAMADOL HCL 50 MG PO TABS: 50.0000 mg | ORAL_TABLET | ORAL | Status: DC | PRN

## 2018-12-05 MED ORDER — TRAZODONE HCL 50 MG PO TABS
50.0000 mg | ORAL_TABLET | Freq: Every day | ORAL | Status: DC
  Administered 2018-12-06: 50 mg via ORAL
  Filled 2018-12-05: qty 1

## 2018-12-05 MED ORDER — OXYCODONE HCL 5 MG PO TABS: 5.0000 mg | ORAL_TABLET | ORAL | Status: DC | PRN

## 2018-12-05 MED ORDER — IODIXANOL 320 MG/ML IV SOLN
INTRAVENOUS | Status: DC | PRN
  Administered 2018-12-05: 40.6 mL via INTRAVENOUS

## 2018-12-05 MED ORDER — SODIUM CHLORIDE 0.9 % IV SOLN: INTRAVENOUS | Status: DC

## 2018-12-05 MED ORDER — PHENYLEPHRINE 40 MCG/ML (10ML) SYRINGE FOR IV PUSH (FOR BLOOD PRESSURE SUPPORT)
PREFILLED_SYRINGE | INTRAVENOUS | Status: AC
  Filled 2018-12-05: qty 10

## 2018-12-05 MED ORDER — LIDOCAINE HCL 1 % IJ SOLN
INTRAMUSCULAR | Status: DC | PRN
  Administered 2018-12-05: 15 mL

## 2018-12-05 MED ORDER — PROTAMINE SULFATE 10 MG/ML IV SOLN
INTRAVENOUS | Status: DC | PRN
  Administered 2018-12-05: 150 mg via INTRAVENOUS

## 2018-12-05 MED ORDER — NITROGLYCERIN IN D5W 200-5 MCG/ML-% IV SOLN: 0.0000 ug/min | INTRAVENOUS | Status: DC

## 2018-12-05 MED ORDER — LACTATED RINGERS IV SOLN
INTRAVENOUS | Status: DC | PRN
  Administered 2018-12-05: 11:00:00 via INTRAVENOUS

## 2018-12-05 MED ORDER — SODIUM CHLORIDE 0.9 % IV SOLN
INTRAVENOUS | Status: AC
  Administered 2018-12-05: 14:00:00 via INTRAVENOUS

## 2018-12-05 MED ORDER — CHLORHEXIDINE GLUCONATE 0.12 % MT SOLN
OROMUCOSAL | Status: AC
  Administered 2018-12-05: 15 mL via OROMUCOSAL
  Filled 2018-12-05: qty 15

## 2018-12-05 MED ORDER — MIDAZOLAM HCL 2 MG/2ML IJ SOLN
INTRAMUSCULAR | Status: AC
  Administered 2018-12-05: 0.5 mg via INTRAVENOUS
  Filled 2018-12-05: qty 2

## 2018-12-05 MED ORDER — SODIUM CHLORIDE 0.9% FLUSH
3.0000 mL | Freq: Two times a day (BID) | INTRAVENOUS | Status: DC
  Administered 2018-12-05 – 2018-12-06 (×3): 3 mL via INTRAVENOUS

## 2018-12-05 MED ORDER — FENTANYL CITRATE (PF) 100 MCG/2ML IJ SOLN
50.0000 ug | Freq: Once | INTRAMUSCULAR | Status: AC
  Administered 2018-12-05: 50 ug via INTRAVENOUS

## 2018-12-05 MED ORDER — MORPHINE SULFATE (PF) 2 MG/ML IV SOLN: 1.0000 mg | INTRAVENOUS | Status: DC | PRN

## 2018-12-05 MED ORDER — ONDANSETRON HCL 4 MG/2ML IJ SOLN
INTRAMUSCULAR | Status: DC | PRN
  Administered 2018-12-05: 4 mg via INTRAVENOUS

## 2018-12-05 MED ORDER — PROPOFOL 500 MG/50ML IV EMUL
INTRAVENOUS | Status: DC | PRN
  Administered 2018-12-05: 10 ug/kg/min via INTRAVENOUS

## 2018-12-05 MED ORDER — FENTANYL CITRATE (PF) 250 MCG/5ML IJ SOLN
INTRAMUSCULAR | Status: DC | PRN
  Administered 2018-12-05 (×2): 25 ug via INTRAVENOUS

## 2018-12-05 MED ORDER — INSULIN GLARGINE 100 UNIT/ML ~~LOC~~ SOLN
10.0000 [IU] | Freq: Every day | SUBCUTANEOUS | Status: DC
  Administered 2018-12-05 – 2018-12-06 (×2): 10 [IU] via SUBCUTANEOUS
  Filled 2018-12-05 (×2): qty 0.1

## 2018-12-05 MED ORDER — VANCOMYCIN HCL IN DEXTROSE 1-5 GM/200ML-% IV SOLN
1000.0000 mg | Freq: Once | INTRAVENOUS | Status: AC
  Administered 2018-12-05: 1000 mg via INTRAVENOUS
  Filled 2018-12-05: qty 200

## 2018-12-05 MED ORDER — LIDOCAINE HCL (PF) 1 % IJ SOLN
INTRAMUSCULAR | Status: AC
  Filled 2018-12-05: qty 30

## 2018-12-05 MED ORDER — CHLORHEXIDINE GLUCONATE 4 % EX LIQD: 30.0000 mL | CUTANEOUS | Status: DC

## 2018-12-05 MED ORDER — ACETAMINOPHEN 325 MG PO TABS
650.0000 mg | ORAL_TABLET | Freq: Four times a day (QID) | ORAL | Status: DC | PRN
  Administered 2018-12-05 – 2018-12-06 (×3): 650 mg via ORAL
  Filled 2018-12-05 (×2): qty 2

## 2018-12-05 MED ORDER — TAMSULOSIN HCL 0.4 MG PO CAPS
0.4000 mg | ORAL_CAPSULE | Freq: Every day | ORAL | Status: DC
  Administered 2018-12-05 – 2018-12-06 (×2): 0.4 mg via ORAL
  Filled 2018-12-05 (×2): qty 1

## 2018-12-05 MED ORDER — SODIUM CHLORIDE 0.9% FLUSH: 3.0000 mL | INTRAVENOUS | Status: DC | PRN

## 2018-12-05 MED ORDER — MIDAZOLAM HCL 2 MG/2ML IJ SOLN
0.5000 mg | Freq: Once | INTRAMUSCULAR | Status: AC
  Administered 2018-12-05: 0.5 mg via INTRAVENOUS

## 2018-12-05 MED ORDER — EPHEDRINE 5 MG/ML INJ
INTRAVENOUS | Status: AC
  Filled 2018-12-05: qty 20

## 2018-12-05 MED ORDER — PROTAMINE SULFATE 10 MG/ML IV SOLN
INTRAVENOUS | Status: AC
  Filled 2018-12-05: qty 5

## 2018-12-05 MED ORDER — ACETAMINOPHEN 325 MG PO TABS
ORAL_TABLET | ORAL | Status: AC
  Filled 2018-12-05: qty 2

## 2018-12-05 MED ORDER — INSULIN ASPART 100 UNIT/ML ~~LOC~~ SOLN: 0.0000 [IU] | Freq: Every day | SUBCUTANEOUS | Status: DC

## 2018-12-05 MED ORDER — SODIUM CHLORIDE 0.9 % IV SOLN: 250.0000 mL | INTRAVENOUS | Status: DC | PRN

## 2018-12-05 MED ORDER — LIDOCAINE 2% (20 MG/ML) 5 ML SYRINGE
INTRAMUSCULAR | Status: AC
  Filled 2018-12-05: qty 5

## 2018-12-05 MED ORDER — SODIUM CHLORIDE 0.9 % IV SOLN
INTRAVENOUS | Status: AC
  Filled 2018-12-05: qty 1.2

## 2018-12-05 MED ORDER — ATORVASTATIN CALCIUM 10 MG PO TABS
20.0000 mg | ORAL_TABLET | Freq: Every day | ORAL | Status: DC
  Administered 2018-12-05 – 2018-12-07 (×3): 20 mg via ORAL
  Filled 2018-12-05 (×3): qty 2

## 2018-12-05 SURGICAL SUPPLY — 92 items
BAG DECANTER FOR FLEXI CONT (MISCELLANEOUS) IMPLANT
BAG SNAP BAND KOVER 36X36 (MISCELLANEOUS) ×8 IMPLANT
BLADE CLIPPER SURG (BLADE) IMPLANT
BLADE STERNUM SYSTEM 6 (BLADE) IMPLANT
BLADE SURG 10 STRL SS (BLADE) ×4 IMPLANT
CABLE ADAPT CONN TEMP 6FT (ADAPTER) ×4 IMPLANT
CANISTER SUCT 3000ML PPV (MISCELLANEOUS) IMPLANT
CANNULA FEM VENOUS REMOTE 22FR (CANNULA) IMPLANT
CANNULA OPTISITE PERFUSION 16F (CANNULA) IMPLANT
CANNULA OPTISITE PERFUSION 18F (CANNULA) IMPLANT
CATH DIAG EXPO 6F VENT PIG 145 (CATHETERS) ×8 IMPLANT
CATH EXPO 5FR AL1 (CATHETERS) IMPLANT
CATH EXTERNAL FEMALE PUREWICK (CATHETERS) IMPLANT
CATH INFINITI 6F AL2 (CATHETERS) ×2 IMPLANT
CATH S G BIP PACING (CATHETERS) ×4 IMPLANT
CHLORAPREP W/TINT 26ML (MISCELLANEOUS) ×4 IMPLANT
CLIP VESOCCLUDE MED 24/CT (CLIP) ×4 IMPLANT
CLIP VESOCCLUDE SM WIDE 24/CT (CLIP) ×4 IMPLANT
CLOSURE MYNX CONTROL 6F/7F (Vascular Products) ×2 IMPLANT
CONT SPEC 4OZ CLIKSEAL STRL BL (MISCELLANEOUS) ×8 IMPLANT
COVER BACK TABLE 80X110 HD (DRAPES) IMPLANT
COVER DOME SNAP 22 D (MISCELLANEOUS) ×2 IMPLANT
COVER WAND RF STERILE (DRAPES) ×4 IMPLANT
CRADLE DONUT ADULT HEAD (MISCELLANEOUS) ×4 IMPLANT
DECANTER SPIKE VIAL GLASS SM (MISCELLANEOUS) ×4 IMPLANT
DERMABOND ADVANCED (GAUZE/BANDAGES/DRESSINGS) ×4
DERMABOND ADVANCED .7 DNX12 (GAUZE/BANDAGES/DRESSINGS) ×2 IMPLANT
DEVICE CLOSURE PERCLS PRGLD 6F (VASCULAR PRODUCTS) ×4 IMPLANT
DRAPE INCISE IOBAN 66X45 STRL (DRAPES) IMPLANT
DRSG TEGADERM 4X4.75 (GAUZE/BANDAGES/DRESSINGS) ×6 IMPLANT
ELECT CAUTERY BLADE 6.4 (BLADE) IMPLANT
ELECT REM PT RETURN 9FT ADLT (ELECTROSURGICAL) ×8
ELECTRODE REM PT RTRN 9FT ADLT (ELECTROSURGICAL) ×4 IMPLANT
FELT TEFLON 6X6 (MISCELLANEOUS) ×4 IMPLANT
FEMORAL VENOUS CANN RAP (CANNULA) IMPLANT
GAUZE SPONGE 4X4 12PLY STRL (GAUZE/BANDAGES/DRESSINGS) ×4 IMPLANT
GLOVE BIO SURGEON STRL SZ7.5 (GLOVE) IMPLANT
GLOVE BIO SURGEON STRL SZ8 (GLOVE) IMPLANT
GLOVE EUDERMIC 7 POWDERFREE (GLOVE) IMPLANT
GLOVE ORTHO TXT STRL SZ7.5 (GLOVE) IMPLANT
GOWN STRL REUS W/ TWL LRG LVL3 (GOWN DISPOSABLE) IMPLANT
GOWN STRL REUS W/ TWL XL LVL3 (GOWN DISPOSABLE) ×2 IMPLANT
GOWN STRL REUS W/TWL LRG LVL3 (GOWN DISPOSABLE)
GOWN STRL REUS W/TWL XL LVL3 (GOWN DISPOSABLE) ×2
GUIDEWIRE SAF TJ AMPL .035X180 (WIRE) ×4 IMPLANT
GUIDEWIRE SAFE TJ AMPLATZ EXST (WIRE) ×4 IMPLANT
GUIDEWIRE STRAIGHT .035 260CM (WIRE) ×4 IMPLANT
INSERT FOGARTY SM (MISCELLANEOUS) IMPLANT
KIT BASIN OR (CUSTOM PROCEDURE TRAY) ×4 IMPLANT
KIT DILATOR VASC 18G NDL (KITS) IMPLANT
KIT HEART LEFT (KITS) ×4 IMPLANT
KIT SUCTION CATH 14FR (SUCTIONS) ×8 IMPLANT
KIT TURNOVER KIT B (KITS) ×4 IMPLANT
LOOP VESSEL MAXI BLUE (MISCELLANEOUS) IMPLANT
LOOP VESSEL MINI RED (MISCELLANEOUS) IMPLANT
NEEDLE 22X1 1/2 (OR ONLY) (NEEDLE) ×4 IMPLANT
NS IRRIG 1000ML POUR BTL (IV SOLUTION) ×4 IMPLANT
PACK ENDO MINOR (CUSTOM PROCEDURE TRAY) ×4 IMPLANT
PAD ARMBOARD 7.5X6 YLW CONV (MISCELLANEOUS) ×8 IMPLANT
PAD ELECT DEFIB RADIOL ZOLL (MISCELLANEOUS) ×4 IMPLANT
PENCIL BUTTON HOLSTER BLD 10FT (ELECTRODE) IMPLANT
PERCLOSE PROGLIDE 6F (VASCULAR PRODUCTS) ×8
SET MICROPUNCTURE 5F STIFF (MISCELLANEOUS) ×6 IMPLANT
SHEATH BRITE TIP 6FR 35CM (SHEATH) ×4 IMPLANT
SHEATH PINNACLE 6F 10CM (SHEATH) ×4 IMPLANT
SHEATH PINNACLE 8F 10CM (SHEATH) ×4 IMPLANT
SLEEVE REPOSITIONING LENGTH 30 (MISCELLANEOUS) ×4 IMPLANT
SPONGE LAP 4X18 RFD (DISPOSABLE) ×4 IMPLANT
STOPCOCK MORSE 400PSI 3WAY (MISCELLANEOUS) ×8 IMPLANT
SUT ETHIBOND X763 2 0 SH 1 (SUTURE) IMPLANT
SUT GORETEX CV 4 TH 22 36 (SUTURE) IMPLANT
SUT GORETEX CV4 TH-18 (SUTURE) IMPLANT
SUT MNCRL AB 3-0 PS2 18 (SUTURE) IMPLANT
SUT PROLENE 5 0 C 1 36 (SUTURE) IMPLANT
SUT PROLENE 6 0 C 1 30 (SUTURE) IMPLANT
SUT SILK  1 MH (SUTURE) ×2
SUT SILK 1 MH (SUTURE) ×2 IMPLANT
SUT VIC AB 2-0 CT1 27 (SUTURE)
SUT VIC AB 2-0 CT1 TAPERPNT 27 (SUTURE) IMPLANT
SUT VIC AB 2-0 CTX 36 (SUTURE) IMPLANT
SUT VIC AB 3-0 SH 8-18 (SUTURE) IMPLANT
SYR 50ML LL SCALE MARK (SYRINGE) ×4 IMPLANT
SYR BULB IRRIGATION 50ML (SYRINGE) IMPLANT
SYR CONTROL 10ML LL (SYRINGE) IMPLANT
SYR MEDRAD MARK V 150ML (SYRINGE) ×4 IMPLANT
TOWEL GREEN STERILE (TOWEL DISPOSABLE) ×8 IMPLANT
TRANSDUCER W/STOPCOCK (MISCELLANEOUS) ×8 IMPLANT
TRAY FOLEY SLVR 14FR TEMP STAT (SET/KITS/TRAYS/PACK) IMPLANT
TUBE SUCT INTRACARD DLP 20F (MISCELLANEOUS) IMPLANT
URINAL MALE W/LID DISP 1000CC (MISCELLANEOUS) IMPLANT
VALVE HEART TRANSCATH SZ3 26MM (Valve) ×2 IMPLANT
WIRE .035 3MM-J 145CM (WIRE) ×4 IMPLANT

## 2018-12-05 NOTE — H&P (Signed)
[address]       [place] [address]             [phone]      Cardiothoracic Surgery Admission History and Physical   Referring Provider is [name], MD  PCP is [name], MD      Chief Complaint  Patient presents with  . Aortic Stenosis       HPI:  The patient is an 85 year old gentleman with a history of diabetes, hypertension, hyperlipidemia, coronary disease status post coronary bypass graft surgery x4 by Dr. Darcey Nora in 2004, paroxysmal atrial fibrillation on Eliquis, heart block status post permanent pacemaker placement, aortic stenosis, congestive heart failure, COPD, and degenerative spine disease with reduced mobility who underwent evaluation last year for severe aortic stenosis with recommendations to proceed with TAVR evaluation. The patient declined at that time but now returns due to progressive shortness of breath and fatigue which are limiting his ability to do his daily activities around the house. He uses a rolling walker in and out of the house. He is now getting short of breath with minimal activity such as walking quickly or any distance more than 10 to 20 feet. He has had some lower extremity edema previously but has none now while on Demadex. He denies any chest pressure pain. He has had no dizziness or syncope. He denies orthopnea but does get short of breath getting into bed and pulling the covers up. His most recent echocardiogram on 09/05/2018 showed thickened and calcified aortic valve leaflets with reduced mobility. The mean gradient was 24 mmHg with a peak gradient of 43 mmHg. Aortic valve area was measured at 0.75 cm. Left ventricular ejection fraction was 50 to 55% with grade 1 diastolic dysfunction. The mitral valve leaflets were mildly thickened with a mean gradient across the mitral valve of 5 mmHg and a peak of 6 mmHg. Cardiac catheterization was last done on 10/20/2017 and showed severe three-vessel coronary disease  with all 4 bypass grafts being patent. The mean gradient across aortic valve at that time was 21.5 mmHg.    The patient lives with his wife and is functionally independent. The patient had his physical therapy evaluation today and his blood pressure prior to the 6-minute walk test was 87/64 with a pulse of 89. After the 6-minute walk test his blood pressure dropped to 60/48 with a pulse of 116. He was given 2 cups of water and his blood pressure improved to 98/60 10 minutes. The patient said that he did not have any dizziness but had moderate shortness of breath with ambulation.      Past Medical History:  Diagnosis Date  . Abdominal aortic atherosclerosis ([place])    by xray  . Aortic stenosis 06/2014   moderate-severe  . Arthritis    in lower back (06/24/2015)  . Atrial flutter ([place])    notes 06/24/2015  . AV block, [place] 1 09/06/2017   Archie Endo 09/06/2017  . 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 CAD (coronary artery disease) 2004   a. s/p CABG in 2004 with LIMA-LAD, SVG-RCA, SVG-OM, and SVG-RI  . Carotid stenosis 1999   s/p L CEA  . CHF (congestive heart failure) ([place]) dx'd 06/2015   a. EF 55-60% by echo in 2016. b. 10/2016: EF reduced to 30-35% by TEE in the setting of atrial fibrillation with RVR  . Colon polyp 2005   (Dr. Tiffany Kocher)  . Compression  fracture of L1 lumbar vertebra ([place]) remote  . COPD (chronic obstructive pulmonary disease) ([place]) 03/2011   by xray  . Depression    hx  . Ex-smoker   . GERD (gastroesophageal reflux disease) 2003   h/o duodenal ulcer per EGD as well as esophagitis  . History of colon polyps 2003, 2005   adenomatous Vira Agar)  . HLD (hyperlipidemia)   . HTN (hypertension)   . Psoriasis   . Renal artery stenosis ([place]) 2004   70% bilateral, followed by cards  . T2DM (type 2 diabetes mellitus) ([place]) 1995  . Urge incontinence of urine         Past Surgical History:  Procedure Laterality Date  . CARDIAC CATHETERIZATION   03/06/2003   No intervention - recommend CABG  . CARDIOVASCULAR STRESS TEST  11/2010   normal perfusion, no evidence of ischemia, EF 62% post exercise  . CARDIOVASCULAR STRESS TEST  11/28/2012   Mild diaphragmatic attenuation; cannot exclude a focal region of nontransmural inferior scar  . CARDIOVERSION N/A 06/25/2015   Procedure: CARDIOVERSION; Surgeon: Pixie Casino, MD; Location: [facility] ENDOSCOPY; Service: Cardiovascular; Laterality: N/A;  . CARDIOVERSION N/A 11/02/2016   Procedure: CARDIOVERSION; Surgeon: [name], MD; Location: [facility]; Service: Cardiovascular; Laterality: N/A;  . CAROTID ENDARTERECTOMY Left 1999   ([place])  . CATARACT EXTRACTION W/ INTRAOCULAR LENS IMPLANT, BILATERAL Bilateral 01/2013   [place]  . COLONOSCOPY  2003   colon polyp x3 - adenomatous Tiffany Kocher)  . COLONOSCOPY  10/08/2012   2 TA, diverticulosis, int hem, no rpt rec Tiffany Kocher)  . CORONARY ANGIOPLASTY    . CORONARY ARTERY BYPASS GRAFT  03/07/2003   4v CABG ([place]) with LIMA to LAD, vein graft to RCA, 1st obtuse marginal, and ramus intermedius  . ESOPHAGOGASTRODUODENOSCOPY  10/08/2012   nl esophagus, duodenitis and erosive gastropathy, path - gastropathy no Hpylori, no rpt rec  . LUMBAR EPIDURAL INJECTION  03/2017   L5/S1 ([place])  . PACEMAKER IMPLANT N/A 04/21/2018   Procedure: PACEMAKER IMPLANT -- Dual Chamber; Surgeon: Deboraha Sprang, MD; Location: [facility] CV LAB; Service: Cardiovascular; Laterality: N/A;  . PERIPHERAL VASCULAR CATHETERIZATION N/A 02/02/2016   Procedure: Renal Angiography; Surgeon: [name], MD; Location: [facility] CV LAB; Service: Cardiovascular; Laterality: N/A;  . PERIPHERAL VASCULAR CATHETERIZATION N/A 02/02/2016   Procedure: Abdominal Aortogram; Surgeon: [name], MD; Location: [facility] LAB; Service: Cardiovascular; Laterality: N/A;  . RENAL DOPPLER  11/29/2011   Celiac&SMA-demonstrated vessel narrowing suggestive of a greater than 50% diameter reduction.  Bilateral renal arteries-demonstrated vessel narrowing of 60-99% diameter reduction. Rt Kidney-mid pole lateral simple cyst noted measuring 1.29x0.76x1.11cm and exophytic cyst outside lower pole measuring 1.23x0.96x1.31. Lft Kidney-lateral mid to lower pole simple cyst measuering-1.24x9.83x1.24  . RIGHT/LEFT HEART CATH AND CORONARY/GRAFT ANGIOGRAPHY N/A 10/20/2017   Procedure: RIGHT/LEFT HEART CATH AND CORONARY/GRAFT ANGIOGRAPHY; Surgeon: Burnell Blanks, MD; Location: [place] [facility] LAB; Service: Cardiovascular; Laterality: N/A;  . TEE WITHOUT CARDIOVERSION N/A 06/25/2015   Procedure: TRANSESOPHAGEAL ECHOCARDIOGRAM (TEE); Surgeon: Pixie Casino, MD; Location: [facility] ENDOSCOPY; Service: Cardiovascular; Laterality: N/A;  . TEE WITHOUT CARDIOVERSION N/A 11/02/2016   Procedure: TRANSESOPHAGEAL ECHOCARDIOGRAM (TEE); Surgeon: [name], MD; Location: [facility] ENDOSCOPY; Service: Cardiovascular; Laterality: N/A;  . TONSILLECTOMY    . UPPER GASTROINTESTINAL ENDOSCOPY  2003   reflux esophagitis, erosive gastropathy, duodenal ulcer        Family History  Problem Relation Age of Onset  . Hypertension Father   . Heart disease Father   . Cancer Mother  GI cancer  . Diabetes Sister   . Heart disease Sister   . Stroke Sister   . Hypertension Sister   . Cancer Brother    Lung cancer - nonsmoker  . Cancer Maternal Grandmother   . Heart attack Maternal Grandfather   . Heart attack Paternal Grandmother   . Heart attack Paternal Grandfather   . Cancer Brother    Pancreatic cancer  . Lung disease Brother    Social History        Socioeconomic History  . Marital status: Widowed    Spouse name: Not on file  . Number of children: Not on file  . Years of education: Not on file  . Highest education level: Not on file  Occupational History  . Not on file  Social Needs  . Financial resource strain: Not on file  . Food insecurity:    Worry: Not on file    Inability: Not on file  .  Transportation needs:    Medical: Not on file    Non-medical: Not on file  Tobacco Use  . Smoking status: Former Smoker    Packs/day: 0.50    Years: 36.00    Pack years: 18.00    Types: Cigarettes    Last attempt to quit: 07/23/2013    Years since quitting: 5.3  . Smokeless tobacco: Never Used  Substance and Sexual Activity  . Alcohol use: No  . Drug use: No  . Sexual activity: Not Currently  Lifestyle  . Physical activity:    Days per week: Not on file    Minutes per session: Not on file  . Stress: Not on file  Relationships  . Social connections:    Talks on phone: Not on file    Gets together: Not on file    Attends religious service: Not on file    Active member of club or organization: Not on file    Attends meetings of clubs or organizations: Not on file    Relationship status: Not on file  . Intimate partner violence:    Fear of current or ex partner: Not on file    Emotionally abused: Not on file    Physically abused: Not on file    Forced sexual activity: Not on file  Other Topics Concern  . Not on file  Social History Narrative   Caffeine: 4 cups coffee/day, some tea and soda   Lives with wife   Occupation: Retired   Activity: no regular exercise   Diet: good water, fruits/vegetables daily         Current Outpatient Medications  Medication Sig Dispense Refill  . acetaminophen (TYLENOL) 500 MG tablet Take 1 tablet (500 mg total) by mouth 3 (three) times daily.    [name] atorvastatin (LIPITOR) 20 MG tablet Take 1 tablet (20 mg total) by mouth daily. 90 tablet 3  . citalopram (CELEXA) 20 MG tablet Take 1 tablet (20 mg total) by mouth daily. 90 tablet 3  . ELIQUIS 5 MG TABS tablet Take 1 tablet (5 mg total) by mouth 2 (two) times daily. 60 tablet 3  . finasteride (PROSCAR) 5 MG tablet Take 1 tablet (5 mg total) by mouth daily. 30 tablet 3  . glucose blood (ACCU-CHEK AVIVA PLUS) test strip 1 each by Other route as needed for other. Use as instructed to check blood  sugar 3 (three) times a day. Dx code: E11.8, E11.65 300 each 0  . insulin glargine (LANTUS) 100 UNIT/ML injection Inject 20 Units  into the skin 2 (two) times daily.    [name] lisinopril (PRINIVIL,ZESTRIL) 40 MG tablet Take 1 tablet (40 mg total) by mouth daily. 90 tablet 2  . metFORMIN (GLUCOPHAGE) 1000 MG tablet Take 1 tablet (1,000 mg total) by mouth 2 (two) times daily with a meal. 180 tablet 1  . nitroGLYCERIN (NITROSTAT) 0.4 MG SL tablet Place 1 tablet (0.4 mg total) under the tongue every 5 (five) minutes as needed for chest pain. 90 tablet 3  . pantoprazole (PROTONIX) 20 MG tablet Take 1 tablet (20 mg total) by mouth daily. 30 tablet 6  . Potassium Chloride ER 20 MEQ TBCR Take 20 mEq by mouth every Monday, Wednesday, Friday, Saturday, and Sunday at 6 PM. 30 tablet 5  . tamsulosin (FLOMAX) 0.4 MG CAPS capsule Take 1 capsule (0.4 mg total) by mouth daily after supper. 30 capsule 6  . torsemide (DEMADEX) 20 MG tablet Take 40 mg by mouth twice daily if weight>205 lb; OTHERWISE Take 20 mg by mouth twice daily if weight is 200-205 lb on home weight scale. 120 tablet 3  . traZODone (DESYREL) 50 MG tablet Take 50 mg by mouth at bedtime.     No current facility-administered medications for this visit.         Allergies  Allergen Reactions  . Gabapentin Other (See Comments)    Leg pain, weakness   . Metoprolol Swelling  . Spironolactone Other (See Comments)    Painful gynecomastia  . Amlodipine Other (See Comments)    Edema  . Other Other (See Comments)    Horse serum - myalgias  . Rosiglitazone Maleate Other (See Comments)    REACTION: Did not help  . Tricor [Fenofibrate] Other (See Comments)    myalgias   Review of Systems:  General: normal appetite, decreased energy, no weight gain, no weight loss, no fever  Cardiac: no chest pain with exertion, no chest pain at rest, +SOB with mild exertion, no resting SOB, no PND, + orthopnea, no palpitations, + arrhythmia, + atrial fibrillation, no LE  edema, no dizzy spells, no syncope  Respiratory: + exertional shortness of breath, no home oxygen, no productive cough, no dry cough, no bronchitis, no wheezing, no hemoptysis, no asthma, no pain with inspiration or cough, no sleep apnea, no CPAP at night  GI: no difficulty swallowing, no reflux, no frequent heartburn, no hiatal hernia, no abdominal pain, no constipation, no diarrhea, no hematochezia, no hematemesis, no melena  GU: no dysuria, no frequency, no urinary tract infection, no hematuria, no enlarged prostate, no kidney stones, no kidney disease  Vascular: no pain suggestive of claudication, no pain in feet, no leg cramps, no varicose veins, no DVT, no non-healing foot ulcer  Neuro: no stroke, no TIA's, no seizures, no headaches, no temporary blindness one eye, no slurred speech, no peripheral neuropathy, no chronic pain, + instability of gait, no memory/cognitive dysfunction  Musculoskeletal: + arthritis, no joint swelling, no myalgias, + difficulty walking, + reduced mobility  Skin: no rash, no itching, no skin infections, no pressure sores or ulcerations  Psych: + anxiety, + depression, no nervousness, no unusual recent stress  Eyes: no blurry vision, no floaters, no recent vision changes, + wears glasses  ENT: + hearing loss and wears hearing aids, no loose or painful teeth, + dentures,  Hematologic: no easy bruising, no abnormal bleeding, no clotting disorder, no frequent epistaxis  Endocrine: + diabetes, does check CBG's at home  Physical Exam:  BP 90/60 (BP Location: Left Arm, Patient  Position: Sitting, Cuff Size: Large) Comment: manually  Pulse 90  Resp 18  Ht 6\' 1"  (1.854 m)  Wt 212 lb (96.2 kg)  SpO2 94% Comment: RA  BMI 27.97 kg/m  General: Elderly but well-appearing  HEENT: Unremarkable, NCAT, PERLA, EOMI, oropharynx clear  Neck: no JVD, no bruits, no adenopathy or thyromegaly  Chest: clear to auscultation, symmetrical breath sounds, no wheezes, no rhonchi  CV: RRR,  grade III/VI crescendo/decrescendo murmur heard best at RSB, no diastolic murmur  Abdomen: soft, non-tender, no masses or organomegaly  Extremities: warm, well-perfused, pulses palpable in feet, no LE edema  Rectal/GU Deferred  Neuro: Grossly non-focal and symmetrical throughout  Skin: Clean and dry, no rashes, no breakdown  Diagnostic Tests:  *[facility]*  *[facility]*  [address], [place] [address]  [phone]  -------------------------------------------------------------------  Transthoracic Echocardiography  Patient: Greg, Cratty  MR #: [id number]  Study Date: 03/26/2018  Gender: M  Age: 66  Height: 185.4 cm  Weight: 94.8 kg  BSA: 2.22 m^2  Pt. Status:  Room: 3E06C  ADMITTING [place], [place], Brittainy M  REFERRING Lyda Jester M  PERFORMING [facility], Inpatient  SONOGRAPHER Roseanna Rainbow  cc:  -------------------------------------------------------------------  LV EF: 60% - 65%  -------------------------------------------------------------------  Indications: CHF - 428.0.  -------------------------------------------------------------------  History: PMH: Aortic stenosis. Elevated troponin. Dyspnea.  Aortic valve disease. Risk factors: Hypertension. Diabetes  mellitus. Dyslipidemia.  -------------------------------------------------------------------  Study Conclusions  - Left ventricle: The cavity size was normal. Wall thickness was  increased in a pattern of severe LVH. Systolic function was  normal. The estimated ejection fraction was in the range of 60%  to 65%. Wall motion was normal; there were no regional wall  motion abnormalities. Doppler parameters are consistent with  abnormal left ventricular relaxation (grade 1 diastolic  dysfunction).  - Aortic valve: Valve mobility was restricted. The right coronary  cusp was heavily calcified and immobile. There was severe    stenosis. There was trivial regurgitation. Valve area (VTI): 1.48  cm^2. Valve area (Vmax): 1.38 cm^2. Valve area (Vmean): 1.59  cm^2.  - Aortic root: The aortic root was mildly dilated.  - Mitral valve: Moderately calcified annulus. Mildly thickened  leaflets . The findings are consistent with mild stenosis. There  was mild regurgitation. Valve area by continuity equation (using  LVOT flow): 2.48 cm^2.  - Left atrium: The atrium was moderately dilated.  - Right ventricle: The cavity size was mildly dilated. Wall  thickness was normal. Systolic function was mildly reduced.  - Right atrium: The atrium was moderately dilated.  -------------------------------------------------------------------  Labs, prior tests, procedures, and surgery:  ECG. Abnormal.  -------------------------------------------------------------------  Study data: Comparison was made to the study of 08/08/2017. Study  status: Routine. Procedure: Transthoracic echocardiography.  Image quality was poor. The study was technically difficult, as a  result of poor sound wave transmission and restricted patient  mobility. Study completion: There were no complications.  Transthoracic echocardiography. M-mode, complete 2D, spectral  Doppler, and color Doppler. Birthdate: Patient birthdate:  07/19/1932. Age: Patient is 85 yr old. Sex: Gender: male.  BMI: 27.6 kg/m^2. Blood pressure: 127/68 Patient status:  Inpatient. Study date: Study date: 03/26/2018. Study time: 02:30  PM. Location: Bedside.  -------------------------------------------------------------------  -------------------------------------------------------------------  Left ventricle: The cavity size was normal. Wall thickness was  increased in a pattern of severe LVH. Systolic function was normal.  The estimated ejection fraction was in the range of 60%  to 65%.  Wall motion was normal; there were no regional wall motion  abnormalities. Doppler parameters are  consistent with abnormal left  ventricular relaxation (grade 1 diastolic dysfunction).  -------------------------------------------------------------------  Aortic valve: Trileaflet; severely calcified leaflets. Valve  mobility was restricted. The right coronary cusp was heavily  calcified and immobile. Doppler: There was severe stenosis.  There was trivial regurgitation. VTI ratio of LVOT to aortic  valve: 0.43. Valve area (VTI): 1.48 cm^2. Indexed valve area (VTI):  0.67 cm^2/m^2. Peak velocity ratio of LVOT to aortic valve: 0.4.  Valve area (Vmax): 1.38 cm^2. Indexed valve area (Vmax): 0.62  cm^2/m^2. Mean velocity ratio of LVOT to aortic valve: 0.46. Valve  area (Vmean): 1.59 cm^2. Indexed valve area (Vmean): 0.72 cm^2/m^2.  Mean gradient (S): 40 mm Hg. Peak gradient (S): 76 mm Hg.  -------------------------------------------------------------------  Aorta: Aortic root: The aortic root was mildly dilated.  -------------------------------------------------------------------  Mitral valve: Moderately calcified annulus. Mildly thickened  leaflets . Doppler: The findings are consistent with mild  stenosis. There was mild regurgitation. Valve area by pressure  half-time: 4.31 cm^2. Indexed valve area by pressure half-time:  1.94 cm^2/m^2. Valve area by continuity equation (using LVOT flow):  2.48 cm^2. Indexed valve area by continuity equation (using LVOT  flow): 1.11 cm^2/m^2. Mean gradient (D): 7 mm Hg. Peak gradient  (D): 13 mm Hg.  -------------------------------------------------------------------  Left atrium: The atrium was moderately dilated.  -------------------------------------------------------------------  Right ventricle: The cavity size was mildly dilated. Wall  thickness was normal. Systolic function was mildly reduced.  -------------------------------------------------------------------  Pulmonic valve: Poorly visualized. Doppler: Transvalvular  velocity was within the  normal range. There was no evidence for  stenosis. There was no significant regurgitation.  -------------------------------------------------------------------  Tricuspid valve: Structurally normal valve. Doppler:  Transvalvular velocity was within the normal range. There was no  regurgitation.  -------------------------------------------------------------------  Pulmonary artery: The main pulmonary artery was normal-sized.  Systolic pressure was within the normal range.  -------------------------------------------------------------------  Right atrium: The atrium was moderately dilated.  -------------------------------------------------------------------  Pericardium: There was no pericardial effusion.  -------------------------------------------------------------------  Systemic veins:  Inferior vena cava: The vessel was normal in size.  -------------------------------------------------------------------  Measurements  Left ventricle Value Reference  LV ID, ED, PLAX chordal (L) 38 mm 43 - 52  LV ID, ES, PLAX chordal 26 mm 23 - 38  LV fx shortening, PLAX chordal 32 % >=29  LV PW thickness, ED 20 mm ----------  IVS/LV PW ratio, ED 0.9 <=1.3  Stroke volume, 2D 129 ml ----------  Stroke volume/bsa, 2D 58 ml/m^2 ----------  LV ejection fraction, 1-p A4C 65 % ----------  LV end-diastolic volume, 2-p 83 ml ----------  LV end-systolic volume, 2-p 30 ml ----------  LV ejection fraction, 2-p 64 % ----------  Stroke volume, 2-p 53 ml ----------  LV end-diastolic volume/bsa, 2-p 37 ml/m^2 ----------  LV end-systolic volume/bsa, 2-p 13 ml/m^2 ----------  Stroke volume/bsa, 2-p 23.7 ml/m^2 ----------  Ventricular septum Value Reference  IVS thickness, ED 18 mm ----------  LVOT Value Reference  LVOT ID, S 21 mm ----------  LVOT area 3.46 cm^2 ----------  LVOT peak velocity, S 174 cm/s ----------  LVOT mean velocity, S 132 cm/s ----------  LVOT VTI, S 37.3 cm ----------  LVOT peak  gradient, S 12 mm Hg ----------  Aortic valve Value Reference  Aortic valve peak velocity, S 437 cm/s ----------  Aortic valve mean velocity, S 287 cm/s ----------  Aortic valve VTI, S 87.1 cm ----------  Aortic mean gradient, S 40 mm  Hg ----------  Aortic peak gradient, S 76 mm Hg ----------  VTI ratio, LVOT/AV 0.43 ----------  Aortic valve area, VTI 1.48 cm^2 ----------  Aortic valve area/bsa, VTI 0.67 cm^2/m^2 ----------  Velocity ratio, peak, LVOT/AV 0.4 ----------  Aortic valve area, peak velocity 1.38 cm^2 ----------  Aortic valve area/bsa, peak 0.62 cm^2/m^2 ----------  velocity  Velocity ratio, mean, LVOT/AV 0.46 ----------  Aortic valve area, mean velocity 1.59 cm^2 ----------  Aortic valve area/bsa, mean 0.72 cm^2/m^2 ----------  velocity  Aorta Value Reference  Aortic root ID, ED 40 mm ----------  Ascending aorta ID, A-P, S 37 mm ----------  Left atrium Value Reference  LA ID, A-P, ES 43 mm ----------  LA ID/bsa, A-P 1.93 cm/m^2 <=2.2  LA volume, S 67.8 ml ----------  LA volume/bsa, S 30.5 ml/m^2 ----------  LA volume, ES, 1-p A4C 64.2 ml ----------  LA volume/bsa, ES, 1-p A4C 28.9 ml/m^2 ----------  LA volume, ES, 1-p A2C 72.1 ml ----------  LA volume/bsa, ES, 1-p A2C 32.4 ml/m^2 ----------  Mitral valve Value Reference  Mitral E-wave peak velocity 182 cm/s ----------  Mitral A-wave peak velocity 197 cm/s ----------  Mitral mean velocity, D 118 cm/s ----------  Mitral deceleration time (L) 130 ms 150 - 230  Mitral pressure half-time 38 ms ----------  Mitral mean gradient, D 7 mm Hg ----------  Mitral peak gradient, D 13 mm Hg ----------  Mitral E/A ratio, peak 0.9 ----------  Mitral valve area, PHT, DP 4.31 cm^2 ----------  Mitral valve area/bsa, PHT, DP 1.94 cm^2/m^2 ----------  Mitral valve area, LVOT 2.48 cm^2 ----------  continuity  Mitral valve area/bsa, LVOT 1.11 cm^2/m^2 ----------  continuity  Mitral annulus VTI, D 52.1 cm ----------  Right atrium  Value Reference  RA ID, S-I, ES, A4C (H) 61.2 mm 34 - 49  RA area, ES, A4C (H) 26.7 cm^2 8.3 - 19.5  RA volume, ES, A/L 94.9 ml ----------  RA volume/bsa, ES, A/L 42.7 ml/m^2 ----------  Right ventricle Value Reference  TAPSE 14.7 mm ----------  Legend:  (L) and (H) mark values outside specified reference range.  -------------------------------------------------------------------  Prepared and Electronically Authenticated by  Pierre Bali, MD  2019-06-23T16:06:28  Physicians  Panel Physicians Referring Physician Case Authorizing Physician  Burnell Blanks, MD (Primary)    Procedures  RIGHT/LEFT HEART CATH AND CORONARY/GRAFT ANGIOGRAPHY  Conclusion  Ost RCA to Prox RCA lesion is 100% stenosed.  SVG graft was visualized by angiography and is normal in caliber.  The graft exhibits minimal luminal irregularities.  SVG graft was visualized by angiography and is normal in caliber.  The graft exhibits no disease.  SVG graft was visualized by angiography and is normal in caliber.  The graft exhibits no disease.  LIMA graft was visualized by non-selective angiography and is normal in caliber.  The graft exhibits no disease.  Ost Cx to Prox Cx lesion is 99% stenosed.  Ost 1st Mrg lesion is 99% stenosed.  Ost 2nd Mrg lesion is 100% stenosed.  Ost LAD to Prox LAD lesion is 99% stenosed.  Ost 1st Diag lesion is 50% stenosed.  Hemodynamic findings consistent with mild pulmonary hypertension. 1. Severe triple vessel CAD with 4/4 patent bypass grafts  2. Severe stenosis proximal LAD. The mid and distal LAD filled from the patent LIMA graft.  3 Severe stenosis proximal Circumflex involving the intermediate branch and the first OM. The vein graft to the intermediate branch is patent. The vein graft to the first OM branch is patent.  4. Chronic occlusion of the proximal RCa. The vein graft to the distal RCA is patent.  5. Aortic valve stenosis (mean gradient 21.5 mmHg, peak to peak  gradient 23 mmHg, AVA 1.12 cm2).  Recommendations: I will review his data with Dr. Burt Knack and the TAVR team. Will proceed with planning for TAVR.   Indications  Severe aortic stenosis [I35.0 (ICD-10-CM)]  Coronary artery disease involving native coronary artery of native heart without angina pectoris [I25.10 (ICD-10-CM)]  Procedural Details  Technical Details Indication: 85 yo male with severe AS, CAD s/p CABG. Workup for TAVR in progress.   Procedure: The risks, benefits, complications, treatment options, and expected outcomes were discussed with the patient. The patient and/or family concurred with the proposed plan, giving informed consent. The patient was sedated with Versed and Fentanyl. There was an IV catheter present in a vein in the right antecubital area. This area was prepped and draped. I then changed out the IV for a 5 French sheath. A right heart catheterization was performed with a balloon tipped catheter. The left wrist was prepped and draped in a sterile fashion. 1% lidocaine was used for local anesthesia. Using the modified Seldinger access technique, a 5 French sheath was placed in the left radial artery. 3 mg Verapamil was given through the sheath. 5000 units IV heparin was given. Standard diagnostic catheters were used to perform selective coronary angiography. I used the JR4 catheter to engage the RCA and all vein grafts. I crossed the Aortic valve with the JR4 catheter. LV pressures measured. No LV gram. The sheath was removed from the left radial artery and a Terumo hemostasis band was applied at the arteriotomy site on the left wrist.     Estimated blood loss <50 mL.  During this procedure the patient was administered the following to achieve and maintain moderate conscious sedation: Versed 1 mg, Fentanyl 25 mcg, while the patient's heart rate, blood pressure, and oxygen saturation were continuously monitored. The period of conscious sedation was 54 minutes, of which I was  present face-to-face 100% of this time.  Medications  (Filter: Administrations occurring from 10/20/17 1153 to 10/20/17 1309)          Medication Rate/Dose/Volume Action  Date Time   fentaNYL (SUBLIMAZE) injection (mcg) 25 mcg Given 10/20/17 1203   Total dose as of 11/14/18 1650        25 mcg        midazolam (VERSED) injection (mg) 1 mg Given 10/20/17 1204   Total dose as of 11/14/18 1650        1 mg        lidocaine (PF) (XYLOCAINE) 1 % injection (mL) 2 mL Given 10/20/17 1221   Total dose as of 11/14/18 1650 2 mL Given 1229   4 mL        Radial Cocktail/Verapamil only (mL) 10 mL Given 10/20/17 1231   Total dose as of 11/14/18 1650        10 mL        heparin injection (Units) 5,000 Units Given 10/20/17 1243   Total dose as of 11/14/18 1650        5,000 Units        heparin infusion 2 units/mL in 0.9 % sodium chloride (mL) 1,000 mL New Bag/Given 10/20/17 1303   Total dose as of 11/14/18 1650        Cannot be calculated        iopamidol (ISOVUE-370) 76 % injection (mL) 115 mL Given  10/20/17 1303   Total dose as of 11/14/18 1650        115 mL        Sedation Time  Sedation Time Physician-1: 54 minutes 3 seconds  Complications  Complications documented before study signed (10/20/2017 1:18 PM EST)   RIGHT/LEFT HEART CATH AND CORONARY/GRAFT ANGIOGRAPHY   None Documented by Burnell Blanks, MD 10/20/2017 1:15 PM EST  Time Range: Intraprocedure    Coronary Findings  Diagnostic  Dominance: Right  Left Anterior Descending  Ost LAD to Prox LAD lesion 99% stenosed  Ost LAD to Prox LAD lesion is 99% stenosed.  First Diagonal Branch  Ost 1st Diag lesion 50% stenosed  Ost 1st Diag lesion is 50% stenosed.  Left Circumflex  Ost Cx to Prox Cx lesion 99% stenosed  Ost Cx to Prox Cx lesion is 99% stenosed.  First Obtuse Marginal Branch  Vessel is moderate in size.  Ost 1st Mrg lesion 99% stenosed  Ost 1st Mrg lesion is 99% stenosed.  Second Obtuse Marginal Branch  Vessel is  moderate in size.  Ost 2nd Mrg lesion 100% stenosed  Ost 2nd Mrg lesion is 100% stenosed.  Right Coronary Artery  Vessel is large.  Ost RCA to Prox RCA lesion 100% stenosed  Ost RCA to Prox RCA lesion is 100% stenosed. The lesion is chronically occluded.  saphenous Graft to RPDA  SVG graft was visualized by angiography and is normal in caliber. The graft exhibits minimal luminal irregularities.  saphenous Graft to 1st Mrg  SVG graft was visualized by angiography and is normal in caliber. The graft exhibits no disease.  saphenous Graft to 2nd Mrg  SVG graft was visualized by angiography and is normal in caliber. The graft exhibits no disease.  LIMA LIMA Graft to Mid LAD  LIMA graft was visualized by non-selective angiography and is normal in caliber. The graft exhibits no disease.  Intervention  No interventions have been documented.  Right Heart  Right Heart Pressures Hemodynamic findings consistent with mild pulmonary hypertension. Elevated LV EDP consistent with volume overload.  Coronary Diagrams  Diagnostic  Dominance: Right   Intervention  Implants  No implant documentation for this case.  Syngo Images     Show images for CARDIAC CATHETERIZATION  MERGE Images  Link to Procedure Log   Show images for CARDIAC CATHETERIZATION Procedure Log  Hemo Data   Most Recent Value  Fick Cardiac Output 4.29 L/min  Fick Cardiac Output Index 1.91 (L/min)/BSA  Aortic Mean Gradient 21.5 mmHg  Aortic Peak Gradient 23 mmHg  Aortic Valve Area 1.12  Aortic Value Area Index 0.5 cm2/BSA  RA A Wave 10 mmHg  RA V Wave 11 mmHg  RA Mean 9 mmHg  RV Systolic Pressure 45 mmHg  RV Diastolic Pressure 3 mmHg  RV EDP 11 mmHg  PA Systolic Pressure 47 mmHg  PA Diastolic Pressure 20 mmHg  PA Mean 30 mmHg  PW A Wave 14 mmHg  PW V Wave 18 mmHg  PW Mean 12 mmHg  AO Systolic Pressure 350 mmHg  AO Diastolic Pressure 72 mmHg  AO Mean 093 mmHg  LV Systolic Pressure 818 mmHg  LV Diastolic Pressure 9  mmHg  LV EDP 23 mmHg  AOp Systolic Pressure 299 mmHg  AOp Diastolic Pressure 76 mmHg  AOp Mean Pressure 371 mmHg  LVp Systolic Pressure 696 mmHg  LVp Diastolic Pressure 11 mmHg  LVp EDP Pressure 25 mmHg  QP/QS 1  TPVR Index 15.74 HRUI  TSVR Index 50.88  HRUI  PVR SVR Ratio 0.2  TPVR/TSVR Ratio 0.31  Addendum  ADDENDUM REPORT: 04/13/2018 14:43  CLINICAL DATA: 85 year old male with severe aortic stenosis being  evaluated for a TAVR procedure.  EXAM:  Cardiac TAVR CT  TECHNIQUE:  The patient was scanned on a [employer]. A 120 kV  retrospective scan was triggered in the descending thoracic aorta at  111 HU's. Gantry rotation speed was 250 msecs and collimation was .6  mm. No beta blockade or nitro were given. The 3D data set was  reconstructed in 5% intervals of the R-R cycle. Systolic and  diastolic phases were analyzed on a dedicated work station using  MPR, MIP and VRT modes. The patient received 80 cc of contrast.  FINDINGS:  Aortic Valve: Trileaflet aortic valve with severely thickened and  calcified leaflets and severely decreased leaflets opening. There  are no calcifications extending into the LVOT.  Aorta: Normal size with diffuse atheroma and calcifications, no  dissection.  Sinotubular Junction: 31 x 30 mm  Ascending Thoracic Aorta: 38 x 36 mm  Aortic Arch: 32 x 29 mm  Descending Thoracic Aorta: 30 x 29 mm  Sinus of Valsalva Measurements:  Non-coronary: 35 mm  Right -coronary: 32 mm  Left -coronary: 35 mm  Coronary Artery Height above Annulus:  Left Main: 14 mm  Right Coronary: 16 mm  Virtual Basal Annulus Measurements:  Maximum/Minimum Diameter: 27.9 x 20.1 mm  Mean Diameter: 23.5 mm  Perimeter: 76.8 mm  Area: 433 mm2  Optimum Fluoroscopic Angle for Delivery: LAO 5 CAU 5  IMPRESSION:  1. Trileaflet aortic valve with severely thickened and calcified  leaflets and severely decreased leaflets opening. There are no  calcifications extending into the  LVOT. Annular measurements  suitable for delivery of a 26 mm Edwards-SAPIEN 3 valve.  2. Sufficient coronary to annulus distance.  3. Optimum Fluoroscopic Angle for Delivery: LAO 5 CAU 5  4. No thrombus in the left atrial appendage.  Electronically Signed  By: Ena Dawley  On: 04/13/2018 14:43   Addended by Dorothy Spark, MD on 04/13/2018 2:45 PM  CLINICAL DATA: 85 year old male with history of severe aortic  stenosis. Preprocedural study prior to potential transcatheter  aortic valve replacement (TAVR) procedure.  EXAM:  CT ANGIOGRAPHY CHEST, ABDOMEN AND PELVIS  TECHNIQUE:  Multidetector CT imaging through the chest, abdomen and pelvis was  performed using the standard protocol during bolus administration of  intravenous contrast. Multiplanar reconstructed images and MIPs were  obtained and reviewed to evaluate the vascular anatomy.  CONTRAST: 169mL ISOVUE-370 IOPAMIDOL (ISOVUE-370) INJECTION 76%  COMPARISON: CT the chest, abdomen and pelvis 07/18/2004.  FINDINGS:  CTA CHEST FINDINGS  Cardiovascular: Heart size is mildly enlarged. There is no  significant pericardial fluid, thickening or pericardial  calcification. There is aortic atherosclerosis, as well as  atherosclerosis of the great vessels of the mediastinum and the  coronary arteries, including calcified atherosclerotic plaque in the  left main, left anterior descending, left circumflex and right  coronary arteries. Status post median sternotomy for CABG including  LIMA to the LAD. Severe thickening calcification of the aortic  valve. Moderate calcifications of the mitral annulus.  Mediastinum/Lymph Nodes: No pathologically enlarged mediastinal or  hilar lymph nodes. Esophagus is unremarkable in appearance. No  axillary lymphadenopathy.  Lungs/Pleura: Mild linear scarring throughout the lower lungs  bilaterally. No acute consolidative airspace disease. No pleural  effusions. No suspicious appearing pulmonary  nodules or masses.  Musculoskeletal/Soft Tissues: Median sternotomy wires. No aggressive  appearing lytic or blastic lesions are noted in the visualized  portions of the skeleton.  CTA ABDOMEN AND PELVIS FINDINGS  Hepatobiliary: No suspicious cystic or solid hepatic lesions. No  intra or extrahepatic biliary ductal dilatation. Gallbladder is  normal in appearance.  Pancreas: No pancreatic mass. No pancreatic ductal dilatation. No  pancreatic or peripancreatic fluid or inflammatory changes.  Spleen: Unremarkable.  Adrenals/Urinary Tract: Indeterminate 1.6 cm right adrenal nodule  (axial image 90 of series 14), stable in size dating back to 2005,  presumably a benign lesion such as an adenoma. Left adrenal gland is  normal in appearance. Low-attenuation lesions in both kidneys  compatible with simple cysts, largest of which is exophytic  measuring 1.8 cm in the lower pole of the right kidney. Several  other subcentimeter low-attenuation lesions in both kidneys are too  small to definitively characterize, but are also statistically  likely to represent small cysts. No hydroureteronephrosis. Urinary  bladder is normal in appearance.  Stomach/Bowel: Normal appearance of the stomach. No pathologic  dilatation of small bowel or colon. Numerous colonic diverticulae  are noted, without surrounding inflammatory changes to suggest an  acute diverticulitis at this time. Normal appendix.  Vascular/Lymphatic: Aortic atherosclerosis with fusiform ectasia of  the infrarenal abdominal aorta which measures up to 2.9 x 2.7 cm. No  aneurysm or dissection noted in the abdominal or pelvic vasculature.  Vascular findings and measurements pertinent to potential TAVR  procedure, as detailed below. Celiac axis is widely patent, but  there is mild stenosis in the proximal splenic artery. Superior  mesenteric artery is extensively disease proximally where there is  mild stenosis. Inferior mesenteric artery  appears patent without  hemodynamically significant stenosis. Single renal arteries  bilaterally with some mild stenosis in the left renal artery. No  lymphadenopathy identified in the abdomen or pelvis.  Reproductive: Prostate gland and seminal vesicles are unremarkable  in appearance. No lymphadenopathy noted in the abdomen or pelvis.  Other: No significant volume of ascites. No pneumoperitoneum.  Musculoskeletal: Chronic appearing compression fracture of superior  endplate of L1 with 10% loss of anterior vertebral body height.  There are no aggressive appearing lytic or blastic lesions noted in  the visualized portions of the skeleton.  VASCULAR MEASUREMENTS PERTINENT TO TAVR:  AORTA:  Minimal Aortic Diameter-15 x 15 mm  Severity of Aortic Calcification-severe  RIGHT PELVIS:  Right Common Iliac Artery -  Minimal Diameter-9.5 x 10.8 mm  Tortuosity-mild  Calcification-moderate to severe  Right External Iliac Artery -  Minimal Diameter-9.0 x 6.9 mm  Tortuosity-very severe  Calcification-mild  Right Common Femoral Artery -  Minimal Diameter-6.8 x 7.2 mm  Tortuosity-mild  Calcification-severe  LEFT PELVIS:  Left Common Iliac Artery -  Minimal Diameter-9.2 x 10.3 mm  Tortuosity-mild  Calcification-moderate to severe  Left External Iliac Artery -  Minimal Diameter-7.8 x 7.4 mm  Tortuosity-severe  Calcification-mild  Left Common Femoral Artery -  Minimal Diameter-8.2 x 7.4 mm  Tortuosity-mild  Calcification-severe  Review of the MIP images confirms the above findings.  IMPRESSION:  1. Vascular findings and measurements pertinent to potential TAVR  procedure, as detailed above.  2. Severe thickening calcification of the aortic valve, compatible  with the reported clinical history of severe aortic stenosis.  3. Aortic atherosclerosis, in addition to left main and 3 vessel  coronary artery disease. Status post median sternotomy for CABG  including LIMA to the LAD.  4.  Colonic diverticulosis without evidence of acute diverticulitis  at this time.  5. Additional incidental findings, as above.  Aortic Atherosclerosis (ICD10-I70.0).  Electronically Signed  By: Vinnie Langton M.D.  On: 04/12/2018 15:30  [facility] Adult Cardiac Surgery Database Version 2.9  Procedure: Isolated AVR   Risk of Mortality: 4.280%  Renal Failure: 3.990%  Permanent Stroke: 2.020%  Prolonged Ventilation: 12.650%  DSW Infection: 0.359%  Reoperation: 3.776%  Morbidity or Mortality: 17.345%  Short Length of Stay: 21.480%  Long Length of Stay: 11.287%     Impression:   This 85 year old gentleman has stage D, severe, symptomatic aortic stenosis with New York Heart Association class III symptoms of exertional fatigue and shortness of breath with a reduced left ventricular ejection fraction of 40 to 45% with grade 1 diastolic dysfunction consistent with chronic combined systolic and diastolic congestive heart failure. I have personally reviewed his 2D echocardiogram, cardiac catheterization, and CT images. His echocardiogram shows a trileaflet aortic valve with calcification and reduced mobility of the valve leaflets. The peak velocity across aortic valve is 326 cm/s with a mean gradient of 24 mmHg. The aortic valve area was measured at 0.75 cm. Although his gradient is still in the moderate range his valve looks severely stenotic and he is clearly developing progressive symptoms. His last cardiac catheterization was a year ago but he had all patent bypass grafts and has had no anginal symptoms. I agree that aortic valve replacement is indicated in this patient who now has progressive symptoms that are limiting his daily functioning. 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 I think transcatheter aortic valve placement be the best option for him.  His gated cardiac CTA shows anatomy suitable for TAVR using a 26 mm Sapien 3 valve with no complicating features. His abdominal and pelvic CTA shows adequate pelvic vascular anatomy to allow transfemoral insertion. He does have significant tortuosity of the right iliac artery which would likely make it difficult to pass the transcatheter valve up that side. The left iliac system is much less tortuous and looks adequate for access. He does have significant calcified plaque in the common femoral arteries bilaterally but I think there is suitable access above this calcification.  The patient and his son were counseled at length regarding treatment alternatives for management of severe symptomatic aortic stenosis. The risks and benefits of surgical intervention has been discussed in detail. Long-term prognosis with medical therapy was discussed. Alternative approaches such as conventional surgical aortic valve replacement, transcatheter aortic valve replacement, and palliative medical therapy were compared and contrasted at length. This discussion was placed in the context of the patient's own specific clinical presentation and past medical history. All of their questions have been addressed.  Following the decision to proceed with transcatheter aortic valve replacement, a discussion was held regarding what types of management strategies would be attempted intraoperatively in the event of life-threatening complications, including whether or not the patient would be considered a candidate for the use of cardiopulmonary bypass and/or conversion to open sternotomy for attempted surgical intervention. The patient is aware of the fact that transient use of cardiopulmonary bypass may be necessary. I would not consider this patient a candidate for redo sternotomy to manage any intraoperative complications given his advanced age and significant disability due to degenerative spine disease. I discussed this with him and his  son and they understand that. The patient has been advised of a variety of complications that  might develop including but not limited to risks of death, stroke, paravalvular leak, aortic dissection or other major vascular complications, aortic annulus rupture, device embolization, cardiac rupture or perforation, mitral regurgitation, acute myocardial infarction, arrhythmia, heart block or bradycardia requiring permanent pacemaker placement, congestive heart failure, respiratory failure, renal failure, pneumonia, infection, other late complications related to structural valve deterioration or migration, or other complications that might ultimately cause a temporary or permanent loss of functional independence or other long term morbidity. The patient provides full informed consent for the procedure as described and all questions were answered.   Plan:   Transfemoral transcatheter aortic valve replacement   Gaye Pollack, MD

## 2018-12-05 NOTE — Op Note (Signed)
[facility]   MULTIDISCIPLINARY HEART VALVE TEAM   TAVR OPERATIVE NOTE   Date of Procedure:  12/05/2018  Preoperative Diagnosis: Severe Aortic Stenosis   Postoperative Diagnosis: Same   Procedure:    Transcatheter Aortic Valve Replacement - Percutaneous Transfemoral Approach  Edwards Sapien 3 THV (size 26 mm, model # 9600TFX, serial # [id number])   Co-Surgeons:  Gaye Pollack, MD and Sherren Mocha, MD  Anesthesiologist:  Belinda Block, MD  Echocardiographer:  Ena Dawley, MD  Pre-operative Echo Findings:  Severe aortic stenosis  Mild left ventricular systolic dysfunction  Post-operative Echo Findings:  No paravalvular leak  Unchanged left ventricular systolic function  BRIEF CLINICAL NOTE AND INDICATIONS FOR SURGERY  Charles [name] is a 85 y.o. male with a hx of coronary artery disease, aortic stenosis, and paroxysmal atrial fibrillation, heart block status post permanent pacemaker, presenting for elective TAVR.  The patient underwent remote CABG in 2004 with a LIMA to LAD, vein graft RCA, vein graft OM, and vein graft to ramus intermedius.  Cardiac catheterization 1 year ago demonstrated continued patency of all of his bypass grafts.  Last year he was evaluated for severe aortic stenosis with recommendations to proceed with TAVR evaluation, but he declined at the time.  He has developed worsening shortness of breath and desire to move forward with TAVR.  I saw the patient last on March 30, 2018.   When I last evaluated him, he was noted to have periods of marked bradycardia with Mobitz 1 block as well as 2: 1 AV block.  I referred him to Dr. Caryl Comes and he ultimately underwent permanent pacemaker placement April 21, 2018.  The patient has developed progressive symptoms of shortness of breath.  After initially deciding not to proceed with TAVR, his symptoms have progressed to a point where he has changed his mind and would like to move forward with  treatment of his aortic stenosis.  He is short of breath with minimal activity such as walking 10 to 20 feet on level ground.  He denies orthopnea or PND.  He denies leg swelling, lightheadedness, syncope, or chest pain.  During the course of the patient's preoperative work up they have been evaluated comprehensively by a multidisciplinary team of specialists coordinated through the [facility] in the [facility].  They have been demonstrated to suffer from symptomatic severe aortic stenosis as noted above. The patient has been counseled extensively as to the relative risks and benefits of all options for the treatment of severe aortic stenosis including long term medical therapy, conventional surgery for aortic valve replacement, and transcatheter aortic valve replacement.  The patient has been independently evaluated by Dr Cyndia Bent in formal cardiac surgical consultation.   Based upon review of all of the patient's preoperative diagnostic tests they are felt to be candidate for transcatheter aortic valve replacement using the transfemoral approach as an alternative to high risk conventional surgery.    Following the decision to proceed with transcatheter aortic valve replacement, a discussion has been held regarding what types of management strategies would be attempted intraoperatively in the event of life-threatening complications, including whether or not the patient would be considered a candidate for the use of cardiopulmonary bypass and/or conversion to open sternotomy for attempted surgical intervention.  The patient has been advised of a variety of complications that might develop peculiar to this approach including but not limited to risks of death, stroke, paravalvular leak, aortic dissection or other  major vascular complications, aortic annulus rupture, device embolization, cardiac rupture or perforation, acute myocardial infarction, arrhythmia, heart  block or bradycardia requiring permanent pacemaker placement, congestive heart failure, respiratory failure, renal failure, pneumonia, infection, other late complications related to structural valve deterioration or migration, or other complications that might ultimately cause a temporary or permanent loss of functional independence or other long term morbidity.  The patient provides full informed consent for the procedure as described and all questions were answered preoperatively.  DETAILS OF THE OPERATIVE PROCEDURE  PREPARATION:   The patient is brought to the operating room on the above mentioned date and central monitoring was established by the anesthesia team including placement of a central venous catheter and radial arterial line. The patient is placed in the supine position on the operating table.  Intravenous antibiotics are administered. The patient is monitored closely throughout the procedure under conscious sedation.  Baseline transthoracic echocardiogram is performed. The patient's chest, abdomen, both groins, and both lower extremities are prepared and draped in a sterile manner. A time out procedure is performed.   PERIPHERAL ACCESS:   Using ultrasound guidance, femoral arterial and venous access is obtained with placement of 6 Fr sheaths on the right side.  A pigtail diagnostic catheter was passed through the femoral arterial sheath under fluoroscopic guidance into the aortic root.  A temporary transvenous pacemaker catheter was passed through the femoral venous sheath under fluoroscopic guidance into the right ventricle.  The pacemaker was tested to ensure stable lead placement and pacemaker capture. Aortic root angiography was performed in order to determine the optimal angiographic angle for valve deployment.  TRANSFEMORAL ACCESS:  A micropuncture technique is used to access the left femoral artery under fluoroscopic and ultrasound guidance.  2 Perclose devices are deployed at 10'  and 2' positions to 'PreClose' the femoral artery. An 8 French sheath is placed and then an Amplatz Superstiff wire is advanced through the sheath. This is changed out for a 14 French transfemoral E-Sheath after progressively dilating over the Superstiff wire.  An AL-2 catheter was used to direct a straight-tip exchange length wire across the native aortic valve into the left ventricle. This was exchanged out for a pigtail catheter and position was confirmed in the LV apex. Simultaneous LV and Ao pressures were recorded.  The pigtail catheter was exchanged for an Amplatz Extra-stiff wire in the LV apex.  Echocardiography was utilized to confirm appropriate wire position and no sign of entanglement in the mitral subvalvular apparatus.  BALLOON AORTIC VALVULOPLASTY:  Not Performed  TRANSCATHETER HEART VALVE DEPLOYMENT:  An Edwards Sapien 3 transcatheter heart valve (size 26 mm, model #9600TFX, serial #[id number]) was prepared and crimped per manufacturer's guidelines, and the proper orientation of the valve is confirmed on the [employer] delivery system. The valve was advanced through the introducer sheath using normal technique until in an appropriate position in the abdominal aorta beyond the sheath tip. The balloon was then retracted and using the fine-tuning wheel was centered on the valve. The valve was then advanced across the aortic arch using appropriate flexion of the catheter. The valve was carefully positioned across the aortic valve annulus. The Commander catheter was retracted using normal technique. Once final position of the valve has been confirmed by angiographic assessment, the valve is deployed while temporarily holding ventilation and during rapid ventricular pacing to maintain systolic blood pressure < 50 mmHg and pulse pressure < 10 mmHg. The balloon inflation is held for >3 seconds after reaching full deployment volume.  Once the balloon has fully deflated the balloon is retracted into  the ascending aorta and valve function is assessed using echocardiography. There is felt to be no paravalvular leak and no central aortic insufficiency.  The patient's hemodynamic recovery following valve deployment is good.  The deployment balloon and guidewire are both removed. Echo demostrated acceptable post-procedural gradients, stable mitral valve function, and no aortic insufficiency.   PROCEDURE COMPLETION:  The sheath was removed and femoral artery closure is performed using the 2 previously deployed Perclose devices.  Protamine is administered once femoral arterial repair was complete. The site is clear with no evidence of bleeding or hematoma after the sutures are tightened. The temporary pacemaker, pigtail catheters and femoral sheaths were removed with manual pressure used for femoral venous hemostasis and Mynx closure used for arterial hemostasis.   The patient tolerated the procedure well and is transported to the surgical intensive care in stable condition. There were no immediate intraoperative complications. All sponge instrument and needle counts are verified correct at completion of the operation.   The patient received a total of 43 mL of intravenous contrast during the procedure.  Sherren Mocha, MD 12/05/2018 1:31 PM

## 2018-12-05 NOTE — Transfer of Care (Signed)
Immediate Anesthesia Transfer of Care Note  Patient: [name]  Procedure(s) Performed: TRANSCATHETER AORTIC VALVE REPLACEMENT, TRANSFEMORAL (N/A Chest) TRANSESOPHAGEAL ECHOCARDIOGRAM (TEE) (N/A )  Patient Location: Cath Lab  Anesthesia Type:MAC  Level of Consciousness: drowsy and patient cooperative  Airway & Oxygen Therapy: Patient Spontanous Breathing and Patient connected to face mask oxygen  Post-op Assessment: Report given to RN, Post -op Vital signs reviewed and stable and Patient moving all extremities X 4  Post vital signs: Reviewed and stable  Last Vitals:  Vitals Value Taken Time  BP 104/64 12/05/2018  1:31 PM  Temp 36.5 C 12/05/2018  1:29 PM  Pulse 59 12/05/2018  1:33 PM  Resp 23 12/05/2018  1:33 PM  SpO2 99 % 12/05/2018  1:33 PM  Vitals shown include unvalidated device data.  Last Pain:  Vitals:   12/05/18 1329  TempSrc: Temporal  PainSc: Asleep      Patients Stated Pain Goal: 3 (40/08/67 6195)  Complications: No apparent anesthesia complications

## 2018-12-05 NOTE — Op Note (Signed)
[facility]   MULTIDISCIPLINARY HEART VALVE TEAM   TAVR OPERATIVE NOTE   Date of Procedure:  12/05/2018  Preoperative Diagnosis: Severe Aortic Stenosis   Postoperative Diagnosis: Same   Procedure:    Transcatheter Aortic Valve Replacement - Percutaneous Left Transfemoral Approach  Edwards Sapien 3 THV (size 26 mm, model # 9600TFX, serial # [id number])   Co-Surgeons:  Gaye Pollack, MD and Sherren Mocha, MD   Anesthesiologist:  Belinda Block, MD  Echocardiographer:  Ena Dawley, Md  Pre-operative Echo Findings:  Severe aortic stenosis  mild left ventricular systolic dysfunction  Post-operative Echo Findings:  No paravalvular leak  mild left ventricular systolic dysfunction   BRIEF CLINICAL NOTE AND INDICATIONS FOR SURGERY  This 85 year old gentleman has stage D, severe, symptomatic aortic stenosis with New York Heart Association class III symptoms of exertional fatigue and shortness of breath with a reduced left ventricular ejection fraction of 40 to 45% with grade 1 diastolic dysfunction consistent with chronic combined systolic and diastolic congestive heart failure. I have personally reviewed his 2D echocardiogram, cardiac catheterization, and CT images. His echocardiogram shows a trileaflet aortic valve with calcification and reduced mobility of the valve leaflets. The peak velocity across aortic valve is 326 cm/s with a mean gradient of 24 mmHg. The aortic valve area was measured at 0.75 cm. Although his gradient is still in the moderate range his valve looks severely stenotic and he is clearly developing progressive symptoms. His last cardiac catheterization was a year ago but he had all patent bypass grafts and has had no anginal symptoms. I agree that aortic valve replacement is indicated in this patient who now has progressive symptoms that are limiting his daily functioning. 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 I think transcatheter aortic valve placement be the best option for him. His gated cardiac CTA shows anatomy suitable for TAVR using a 26 mm Sapien 3 valve with no complicating features. His abdominal and pelvic CTA shows adequate pelvic vascular anatomy to allow transfemoral insertion. He does have significant tortuosity of the right iliac artery which would likely make it difficult to pass the transcatheter valve up that side. The left iliac system is much less tortuous and looks adequate for access. He does have significant calcified plaque in the common femoral arteries bilaterally but I think there is suitable access above this calcification.  The patient and his son were counseled at length regarding treatment alternatives for management of severe symptomatic aortic stenosis. The risks and benefits of surgical intervention has been discussed in detail. Long-term prognosis with medical therapy was discussed. Alternative approaches such as conventional surgical aortic valve replacement, transcatheter aortic valve replacement, and palliative medical therapy were compared and contrasted at length. This discussion was placed in the context of the patient's own specific clinical presentation and past medical history. All of their questions have been addressed.  Following the decision to proceed with transcatheter aortic valve replacement, a discussion was held regarding what types of management strategies would be attempted intraoperatively in the event of life-threatening complications, including whether or not the patient would be considered a candidate for the use of cardiopulmonary bypass and/or conversion to open sternotomy for attempted surgical intervention. The patient is aware of the fact that transient use of cardiopulmonary bypass may be necessary. I would not consider this patient  a candidate for  redo sternotomy to manage any intraoperative complications given his advanced age and significant disability due to degenerative spine disease. I discussed this with him and his son and they understand that. The patient has been advised of a variety of complications that might develop including but not limited to risks of death, stroke, paravalvular leak, aortic dissection or other major vascular complications, aortic annulus rupture, device embolization, cardiac rupture or perforation, mitral regurgitation, acute myocardial infarction, arrhythmia, heart block or bradycardia requiring permanent pacemaker placement, congestive heart failure, respiratory failure, renal failure, pneumonia, infection, other late complications related to structural valve deterioration or migration, or other complications that might ultimately cause a temporary or permanent loss of functional independence or other long term morbidity. The patient provides full informed consent for the procedure as described and all questions were answered.      DETAILS OF THE OPERATIVE PROCEDURE  PREPARATION:    The patient is brought to the operating room on the above mentioned date and central monitoring was established by the anesthesia team including placement of a central venous line and radial arterial line. The patient is placed in the supine position on the operating table.  Intravenous antibiotics are administered. The patient is monitored closely throughout the procedure under conscious sedation.    Baseline transesophageal transthoracic echocardiogram was performed. The patient's chest, abdomen, both groins, and both lower extremities are prepared and draped in a sterile manner. A time out procedure is performed.   PERIPHERAL ACCESS:    Using the modified Seldinger technique, femoral arterial and venous access was obtained with placement of 6 Fr sheaths on the right side.  A pigtail diagnostic catheter was  passed through the right arterial sheath under fluoroscopic guidance into the aortic root.  A temporary transvenous pacemaker catheter was passed through the right femoral venous sheath under fluoroscopic guidance into the right ventricle.  The pacemaker was tested to ensure stable lead placement and pacemaker capture. Aortic root angiography was performed in order to determine the optimal angiographic angle for valve deployment.   TRANSFEMORAL ACCESS:   Percutaneous transfemoral access and sheath placement was performed using ultrasound guidance.  The left common femoral artery was cannulated using a micropuncture needle and appropriate location was verified using hand injection angiogram.  A pair of [employer] Perclose percutaneous closure devices were placed and a 6 French sheath replaced into the femoral artery.  The patient was heparinized systemically and ACT verified > 250 seconds.    A 14 Fr transfemoral E-sheath was introduced into the left femoral artery after progressively dilating over an Amplatz superstiff wire. An AL-1 catheter was used to direct a straight-tip exchange length wire across the native aortic valve into the left ventricle. This was exchanged out for a pigtail catheter and position was confirmed in the LV apex. Simultaneous LV and Ao pressures were recorded.  The pigtail catheter was exchanged for an Amplatz Extra-stiff wire in the LV apex.    BALLOON AORTIC VALVULOPLASTY:   Not performed  TRANSCATHETER HEART VALVE DEPLOYMENT:   An Edwards Sapien 3 transcatheter heart valve (size 26 mm, model #9600TFX, serial #[id number]) was prepared and crimped per manufacturer's guidelines, and the proper orientation of the valve is confirmed on the [employer] delivery system. The valve was advanced through the introducer sheath using normal technique until in an appropriate position in the abdominal aorta beyond the sheath tip. The balloon was then retracted and using the fine-tuning  wheel was centered on the valve. The valve was then advanced across  the aortic arch using appropriate flexion of the catheter. The valve was carefully positioned across the aortic valve annulus. The Commander catheter was retracted using normal technique. Once final position of the valve has been confirmed by angiographic assessment, the valve is deployed while temporarily holding ventilation and during rapid ventricular pacing to maintain systolic blood pressure < 50 mmHg and pulse pressure < 10 mmHg. The balloon inflation is held for >3 seconds after reaching full deployment volume. Once the balloon has fully deflated the balloon is retracted into the ascending aorta and valve function is assessed using echocardiography. There is felt to be no paravalvular leak and no central aortic insufficiency.  The patient's hemodynamic recovery following valve deployment is good.  The deployment balloon and guidewire are both removed.    PROCEDURE COMPLETION:   The sheath was removed and femoral artery closure performed using the previously placed Perclose devices.  Protamine was administered once femoral arterial repair was complete. The temporary pacemaker, pigtail catheters and right femoral venous sheath was removed with manual pressure used for hemostasis. A Mynx closure device was used for the right femoral artery.  The patient tolerated the procedure well and is transported to the surgical intensive care in stable condition. There were no immediate intraoperative complications. All sponge instrument and needle counts are verified correct at completion of the operation.   No blood products were administered during the operation.  The patient received a total of 43 mL of intravenous contrast during the procedure.   Gaye Pollack, MD 12/05/2018

## 2018-12-05 NOTE — Progress Notes (Signed)
Site area: rt radial arterial line Site Prior to Removal:  Level 0 Pressure Applied For: 10 minutes Manual:   yes Patient Status During Pull:  stable Post Pull Site:  Level  0 Post Pull Instructions Given:  yes Post Pull Pulses Present: rt radial 2+ Dressing Applied:  Gauze and tegaderm Bedrest begins @  Comments:

## 2018-12-05 NOTE — Progress Notes (Signed)
2nd fluid bolus started per order KThompson - 0.9NS 500cc

## 2018-12-05 NOTE — Progress Notes (Signed)
  [facility] VALVE TEAM  Patient doing well s/p TAVR. He is hemodynamically stable, but BP is soft. Patient is very sedated still. Groin sites stable. Lung exam with some expiratory wheezing. ECG showed paced rhythm. BP will likely improve when he becomes more alert. Plan for early ambulation after bedrest completed and hopeful discharge over the next 24-48 hours.   Angelena Form PA-C  [facility]  Pager [phone]

## 2018-12-05 NOTE — Anesthesia Preprocedure Evaluation (Addendum)
Anesthesia Evaluation  Patient identified by MRN, date of birth, ID band Patient awake    Reviewed: Allergy & Precautions, NPO status , Patient's Chart, lab work & pertinent test results  Airway Mallampati: II  TM Distance: >3 FB     Dental   Pulmonary shortness of breath, pneumonia, COPD, former smoker,    breath sounds clear to auscultation       Cardiovascular hypertension, + CAD and +CHF  + dysrhythmias + pacemaker + Valvular Problems/Murmurs  Rhythm:Regular Rate:Normal     Neuro/Psych    GI/Hepatic Neg liver ROS, GERD  ,  Endo/Other  diabetes  Renal/GU Renal disease     Musculoskeletal   Abdominal   Peds  Hematology   Anesthesia Other Findings   Reproductive/Obstetrics                             Anesthesia Physical Anesthesia Plan  ASA: III  Anesthesia Plan: MAC   Post-op Pain Management:    Induction: Intravenous  PONV Risk Score and Plan: 1 and Treatment may vary due to age or medical condition  Airway Management Planned: Simple Face Mask and Nasal Cannula  Additional Equipment:   Intra-op Plan:   Post-operative Plan:   Informed Consent: I have reviewed the patients History and Physical, chart, labs and discussed the procedure including the risks, benefits and alternatives for the proposed anesthesia with the patient or authorized representative who has indicated his/her understanding and acceptance.     Dental advisory given  Plan Discussed with: CRNA and Anesthesiologist  Anesthesia Plan Comments:        Anesthesia Quick Evaluation

## 2018-12-05 NOTE — Progress Notes (Signed)
Notified brian Small with [employer] that device orders were received and stated that procedure would likely interfere with device function and device should be programmed to asynchronous pacing during procedure and returned to normal programing after procedure.  Aaron Edelman stated that they are typically not needed for TAVR's but to call back if needed.   No tracking sheet put on chart at this time since Aaron Edelman stated they would not be coming.

## 2018-12-05 NOTE — Progress Notes (Signed)
Bed rest completed, pt still sleepy but arouses easily and answer questions appropriately. Neuro intact. B groins with out evidence of hematoma will monitor patient.vital signs stable at this time Charles [name], Bettina Gavia rN

## 2018-12-05 NOTE — Progress Notes (Addendum)
Patient arrived from [facility] lab to 4e08 patient placed on monitor B groins level 0 vital signs obtained. Patient very sleepy but easily awakened. Per report pt centra line dressing had been  oozing, pt with  dry dressing does not appear to be oozing at this time.  Will monitor patient. Razi Hickle, Bettina Gavia RN

## 2018-12-05 NOTE — Anesthesia Postprocedure Evaluation (Signed)
Anesthesia Post Note  Patient: Charles [name]  Procedure(s) Performed: TRANSCATHETER AORTIC VALVE REPLACEMENT, TRANSFEMORAL (N/A Chest) TRANSESOPHAGEAL ECHOCARDIOGRAM (TEE) (N/A )     Patient location during evaluation: SICU Anesthesia Type: MAC Level of consciousness: awake Pain management: pain level controlled Respiratory status: spontaneous breathing Cardiovascular status: stable Postop Assessment: no apparent nausea or vomiting Anesthetic complications: no    Last Vitals:  Vitals:   12/05/18 1401 12/05/18 1430  BP:  (!) 83/53  Pulse:  (!) 59  Resp:  19  Temp: 36.6 C   SpO2:  97%    Last Pain:  Vitals:   12/05/18 1401  TempSrc: Temporal  PainSc:                  Saraiya Kozma

## 2018-12-05 NOTE — Progress Notes (Signed)
85/53   HR 60  O2 sat 96% RR 20

## 2018-12-05 NOTE — Progress Notes (Signed)
500cc NS IV fluid bolus started per Katie Thompson/order. C/O rt hip pain. Area massaged.

## 2018-12-05 NOTE — Anesthesia Procedure Notes (Signed)
Procedure Name: MAC Date/Time: 12/05/2018 11:33 AM Performed by: Julieta Bellini, CRNA Pre-anesthesia Checklist: Patient identified, Emergency Drugs available, Suction available and Patient being monitored Patient Re-evaluated:Patient Re-evaluated prior to induction Oxygen Delivery Method: Simple face mask

## 2018-12-05 NOTE — Progress Notes (Signed)
  Echocardiogram 2D Echocardiogram has been performed.  Charles [name] 12/05/2018, 12:57 PM

## 2018-12-05 NOTE — Anesthesia Procedure Notes (Addendum)
Central Venous Catheter Insertion Performed by: Belinda Block, MD, anesthesiologist Start/End3/12/2018 9:55 AM, 12/05/2018 10:10 AM Preanesthetic checklist: patient identified, IV checked, site marked, risks and benefits discussed, surgical consent, monitors and equipment checked, pre-op evaluation and timeout performed Position: Trendelenburg Lidocaine 1% used for infiltration and patient sedated Hand hygiene performed , maximum sterile barriers used  and Seldinger technique used Central line was placed.Double lumen Procedure performed using ultrasound guided technique. Attempts: 1 Following insertion, line sutured, dressing applied and Biopatch. Post procedure assessment: blood return through all ports  Patient tolerated the procedure well with no immediate complications.

## 2018-12-06 ENCOUNTER — Encounter: Payer: Self-pay | Admitting: Family Medicine

## 2018-12-06 ENCOUNTER — Inpatient Hospital Stay (HOSPITAL_COMMUNITY): Payer: Medicare Other

## 2018-12-06 DIAGNOSIS — I350 Nonrheumatic aortic (valve) stenosis: Secondary | ICD-10-CM

## 2018-12-06 DIAGNOSIS — I5033 Acute on chronic diastolic (congestive) heart failure: Secondary | ICD-10-CM

## 2018-12-06 DIAGNOSIS — Z952 Presence of prosthetic heart valve: Secondary | ICD-10-CM

## 2018-12-06 LAB — CBC
HCT: 34.6 % — ABNORMAL LOW (ref 39.0–52.0)
Hemoglobin: 10.8 g/dL — ABNORMAL LOW (ref 13.0–17.0)
MCH: 30.1 pg (ref 26.0–34.0)
MCHC: 31.2 g/dL (ref 30.0–36.0)
MCV: 96.4 fL (ref 80.0–100.0)
Platelets: 145 10*3/uL — ABNORMAL LOW (ref 150–400)
RBC: 3.59 MIL/uL — ABNORMAL LOW (ref 4.22–5.81)
RDW: 13 % (ref 11.5–15.5)
WBC: 7.3 10*3/uL (ref 4.0–10.5)
nRBC: 0 % (ref 0.0–0.2)

## 2018-12-06 LAB — ECHOCARDIOGRAM LIMITED
Height: 73 in
Weight: 3626.13 oz

## 2018-12-06 LAB — GLUCOSE, CAPILLARY
GLUCOSE-CAPILLARY: 187 mg/dL — AB (ref 70–99)
Glucose-Capillary: 130 mg/dL — ABNORMAL HIGH (ref 70–99)
Glucose-Capillary: 242 mg/dL — ABNORMAL HIGH (ref 70–99)
Glucose-Capillary: 183 mg/dL — ABNORMAL HIGH (ref 70–99)

## 2018-12-06 LAB — BASIC METABOLIC PANEL
Anion gap: 5 (ref 5–15)
BUN: 22 mg/dL (ref 8–23)
CALCIUM: 8.6 mg/dL — AB (ref 8.9–10.3)
CO2: 27 mmol/L (ref 22–32)
Chloride: 105 mmol/L (ref 98–111)
Creatinine, Ser: 1.21 mg/dL (ref 0.61–1.24)
GFR calc non Af Amer: 55 mL/min — ABNORMAL LOW (ref >60)
Glucose, Bld: 99 mg/dL (ref 70–99)
Potassium: 4.1 mmol/L (ref 3.5–5.1)
Sodium: 137 mmol/L (ref 135–145)

## 2018-12-06 LAB — MAGNESIUM: Magnesium: 1.6 mg/dL — ABNORMAL LOW (ref 1.7–2.4)

## 2018-12-06 MED ORDER — TORSEMIDE 20 MG PO TABS
20.0000 mg | ORAL_TABLET | Freq: Two times a day (BID) | ORAL | Status: DC
  Administered 2018-12-07: 20 mg via ORAL
  Filled 2018-12-06: qty 1

## 2018-12-06 MED ORDER — POTASSIUM CHLORIDE CRYS ER 20 MEQ PO TBCR
20.0000 meq | EXTENDED_RELEASE_TABLET | ORAL | Status: DC
  Administered 2018-12-06: 20 meq via ORAL
  Filled 2018-12-06: qty 1

## 2018-12-06 MED ORDER — APIXABAN 5 MG PO TABS
5.0000 mg | ORAL_TABLET | Freq: Two times a day (BID) | ORAL | Status: DC
  Administered 2018-12-06 – 2018-12-07 (×2): 5 mg via ORAL
  Filled 2018-12-06 (×2): qty 1

## 2018-12-06 MED ORDER — FUROSEMIDE 10 MG/ML IJ SOLN
40.0000 mg | Freq: Once | INTRAMUSCULAR | Status: AC
  Administered 2018-12-06: 40 mg via INTRAVENOUS
  Filled 2018-12-06: qty 4

## 2018-12-06 NOTE — Progress Notes (Signed)
12/06/2018 6:23 PM Central Line RIJ D/C'D.  Pt tolerated well. [place]

## 2018-12-06 NOTE — Progress Notes (Signed)
CARDIAC REHAB PHASE I   Offered to walk with pt. Pt and son state pt just returned from walk with RN. Pt still on oxygen, states he is not on any at home. Encouraged IS use and ambulation. Will continue to follow.  [phone] Rufina Falco, RN BSN 12/06/2018 2:09 PM

## 2018-12-06 NOTE — Progress Notes (Signed)
  Echocardiogram 2D Echocardiogram has been performed.  Charles [name] 12/06/2018, 9:34 AM

## 2018-12-06 NOTE — Progress Notes (Addendum)
[place] VALVE TEAM  Patient Name: Charles [name] Date of Encounter: 12/06/2018  Primary Cardiologist: Dr. [name] / Dr. Burt Knack & Dr. Cyndia Bent (TAVR)  Hospital Problem List     Principal Problem:   S/P TAVR (transcatheter aortic valve replacement) Active Problems:   Diabetes mellitus type 2, uncontrolled, with complications ([place])   HYPERCHOLESTEROLEMIA   MDD (major depressive disorder), recurrent episode, moderate (HCC)   Essential hypertension   GERD   Carotid arterial disease (HCC)   Aortic stenosis, severe   Acute on chronic diastolic CHF (congestive heart failure) ([place])   Coronary artery disease involving native coronary artery of native heart without angina pectoris   Paroxysmal atrial flutter ([place])   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 St Jude     Subjective   Wondering when he can go home. Has not been up walking around at all. No chest pain or SOB.   Inpatient Medications    Scheduled Meds: . aspirin  81 mg Oral Daily  . atorvastatin  20 mg Oral Daily  . citalopram  20 mg Oral Daily  . finasteride  5 mg Oral Daily  . insulin aspart  0-15 Units Subcutaneous TID WC  . insulin aspart  0-5 Units Subcutaneous QHS  . insulin glargine  10 Units Subcutaneous QHS  . pantoprazole  20 mg Oral Daily  . sodium chloride flush  3 mL Intravenous Q12H  . tamsulosin  0.4 mg Oral QPC supper  . traZODone  50 mg Oral QHS   Continuous Infusions: . sodium chloride    . cefUROXime (ZINACEF)  IV 1.5 g (12/05/18 2320)  . nitroGLYCERIN    . phenylephrine (NEO-SYNEPHRINE) Adult infusion    . sodium chloride 500 mL/hr at 12/05/18 1500  . sodium chloride 500 mL/hr at 12/05/18 1557   PRN Meds: sodium chloride, acetaminophen **OR** acetaminophen, morphine injection, ondansetron (ZOFRAN) IV, oxyCODONE, sodium chloride flush, traMADol   Vital Signs    Vitals:   12/06/18 0356 12/06/18 0620 12/06/18 0757 12/06/18 0759  BP:   (!) 159/94  (!) 141/87  Pulse:  81 89   Resp:  20 (!) 24   Temp:   98.7 F (37.1 C)   TempSrc:   Oral   SpO2:  96% 90%   Weight: 102.8 kg     Height:        Intake/Output Summary (Last 24 hours) at 12/06/2018 0936 Last data filed at 12/06/2018 6761 Gross per 24 hour  Intake 2648.74 ml  Output 490 ml  Net 2158.74 ml   Filed Weights   12/05/18 1946 12/06/18 0356  Weight: 104.2 kg 102.8 kg    Physical Exam   GEN: Well nourished, well developed, in no acute distress. obese HEENT: Grossly normal.  Neck: Supple, + JVD, no carotid bruits or masses. Cardiac: RRR, no murmurs, rubs, or gallops. No clubbing, cyanosis, edema.  Radials/DP/PT 2+ and equal bilaterally.  Respiratory:  Expiratory wheezing in precordium. Mild crackles on lung exam  GI: Soft, nontender, nondistended, BS + x 4. MS: no deformity or atrophy. Skin: warm and dry, no rash. Groin sites stable. Mild ecchymosis on left groin Neuro:  Strength and sensation are intact. Psych: AAOx3.  Normal affect.  Labs    CBC Recent Labs    12/05/18 1341 12/06/18 0322  WBC  --  7.3  HGB 10.5* 10.8*  HCT 31.0* 34.6*  MCV  --  96.4  PLT  --  950*   Basic Metabolic  Panel Recent Labs    12/05/18 1305 12/05/18 1341 12/05/18 1346 12/06/18 0322  NA 140 140  --  137  K 4.6 4.6  --  4.1  CL  --   --   --  105  CO2  --   --   --  27  GLUCOSE 202*  --   --  99  BUN  --   --   --  22  CREATININE  --   --  1.00 1.21  CALCIUM  --   --   --  8.6*  MG  --   --   --  1.6*   Liver Function Tests No results for input(s): AST, ALT, ALKPHOS, BILITOT, PROT, ALBUMIN in the last 72 hours. No results for input(s): LIPASE, AMYLASE in the last 72 hours. Cardiac Enzymes No results for input(s): CKTOTAL, CKMB, CKMBINDEX, TROPONINI in the last 72 hours. BNP Invalid input(s): POCBNP D-Dimer No results for input(s): DDIMER in the last 72 hours. Hemoglobin A1C No results for input(s): HGBA1C in the last 72 hours. Fasting Lipid Panel No results for  input(s): CHOL, HDL, LDLCALC, TRIG, CHOLHDL, LDLDIRECT in the last 72 hours. Thyroid Function Tests No results for input(s): TSH, T4TOTAL, T3FREE, THYROIDAB in the last 72 hours.  Invalid input(s): FREET3  Telemetry    paced- Personally Reviewed  ECG    paced - Personally Reviewed  Radiology    Dg Chest Port 1 View  Result Date: 12/05/2018 CLINICAL DATA:  Status post TAVR. EXAM: PORTABLE CHEST 1 VIEW COMPARISON:  Chest radiograph December 01, 2018 FINDINGS: Low inspiratory examination. New aortic stent. Stable cardiomegaly. Status post median sternotomy for CABG. Calcified aortic arch. Stable rounded density LEFT lung base atelectasis versus scarring. No pneumothorax. LEFT cardiac pacemaker in situ. RIGHT internal jugular central venous catheter distal tip obscured by pacemaker leads. Osteopenia. IMPRESSION: 1. Interval TAVR. 2. Stable cardiomegaly.  No acute pulmonary process. Electronically Signed   By: Elon Alas M.D.   On: 12/05/2018 18:31    Cardiac Studies    TAVR OPERATIVE NOTE   Date of Procedure:                12/05/2018  Preoperative Diagnosis:      Severe Aortic Stenosis   Postoperative Diagnosis:    Same   Procedure:        Transcatheter Aortic Valve Replacement - Percutaneous Left Transfemoral Approach             Edwards Sapien 3 THV (size 26 mm, model # 9600TFX, serial # [id number])              Co-Surgeons:                        Gaye Pollack, MD and Sherren Mocha, MD   Anesthesiologist:                  Belinda Block, MD  Echocardiographer:              Ena Dawley, Md  Pre-operative Echo Findings: ? Severe aortic stenosis ? mild left ventricular systolic dysfunction  Post-operative Echo Findings: ? No paravalvular leak ? mild left ventricular systolic dysfunction  ________________  Echo 12/06/2018: pending   Patient Profile     BREVYN RING is a 85 y.o. male with a history of DMT2, HTN, HLD, DMT2, CAD s/p CABG x4V  (2004), PAF on Eliquis, CHB s/p PPM, chronic diastolic CHF, COPD, DDD  with reduced mobility, and severe AS who presented to [facility] on [facility]05[facility]18 for planned TAVR.   Assessment & Plan    Severe AS: s/p successful TAVR with a 26 mm Edwards Sapien 3 THV via the TF approach on 12/05/2018. Post operative echo pending. Groin sites are stable. ECG with paced rhythm. Continue Asprin and resume home Eliquis tonight. Plan to mobilize patient and hopeful discharge home tomorrow.   Acute on chronic diastolic CHF: as evidenced by an elevated pre op BNP >400 on pre op labs. He has evidence of mild volume overload with elevated JVD and mild crackles on lung exam. Will give one dose of IV lasix 40mg  now and resume home Torsemide 20mg  BID tomorrow.   HTN: BP starting to become more elevated. Continue to hold home lisinopril 40mg  since I am giving him IV lasix now.   CAD s/p CABG: pre TAVR catheterization on 10/20/2017 and showed severe three-vessel coronary disease with all 4 bypass grafts being patent.   DMT2: continue SSI. Holding metformin  PAFlutter: ECG with paced rhythm. Will resume Eliquis tonight.   SignedAngelena Form, PA-C  12/06/2018, 9:36 AM  Pager [phone]  Patient seen, examined. Available data reviewed. Agree with findings, assessment, and plan as outlined by Nell Range, PA-C. Pt is alert, oriented in NAD. Family at bedside. Lungs diminished in the bases, heart RRR soft SEM at the RUSB, no diastolic murmur, bilateral groin sites clear. Extremities without edema. I agree with IV diuresis today, FU labs in the am, possible DC tomorrow. Pt progressing well after TAVR.  Sherren Mocha, M.D. 12/06/2018 1:51 PM

## 2018-12-06 NOTE — Discharge Instructions (Addendum)

## 2018-12-07 ENCOUNTER — Other Ambulatory Visit: Payer: Self-pay | Admitting: Physician Assistant

## 2018-12-07 DIAGNOSIS — Z952 Presence of prosthetic heart valve: Secondary | ICD-10-CM

## 2018-12-07 LAB — CBC
HCT: 33.8 % — ABNORMAL LOW (ref 39.0–52.0)
Hemoglobin: 10.8 g/dL — ABNORMAL LOW (ref 13.0–17.0)
MCH: 30.2 pg (ref 26.0–34.0)
MCHC: 32 g/dL (ref 30.0–36.0)
MCV: 94.4 fL (ref 80.0–100.0)
Platelets: 132 10*3/uL — ABNORMAL LOW (ref 150–400)
RBC: 3.58 MIL/uL — ABNORMAL LOW (ref 4.22–5.81)
RDW: 12.7 % (ref 11.5–15.5)
WBC: 6.5 10*3/uL (ref 4.0–10.5)
nRBC: 0 % (ref 0.0–0.2)

## 2018-12-07 LAB — GLUCOSE, CAPILLARY
Glucose-Capillary: 92 mg/dL (ref 70–99)
Glucose-Capillary: 230 mg/dL — ABNORMAL HIGH (ref 70–99)

## 2018-12-07 LAB — BASIC METABOLIC PANEL
Anion gap: 8 (ref 5–15)
BUN: 18 mg/dL (ref 8–23)
CO2: 26 mmol/L (ref 22–32)
Calcium: 8.8 mg/dL — ABNORMAL LOW (ref 8.9–10.3)
Chloride: 102 mmol/L (ref 98–111)
Creatinine, Ser: 1.22 mg/dL (ref 0.61–1.24)
GFR calc Af Amer: >60 mL/min (ref >60)
GFR, EST NON AFRICAN AMERICAN: 54 mL/min — AB (ref >60)
Glucose, Bld: 86 mg/dL (ref 70–99)
Potassium: 3.7 mmol/L (ref 3.5–5.1)
Sodium: 136 mmol/L (ref 135–145)

## 2018-12-07 MED ORDER — ASPIRIN 81 MG PO CHEW: 81.0000 mg | CHEWABLE_TABLET | Freq: Every day | ORAL | Status: DC

## 2018-12-07 MED FILL — Potassium Chloride Inj 2 mEq/ML: INTRAVENOUS | Qty: 40 | Status: AC

## 2018-12-07 MED FILL — Heparin Sodium (Porcine) Inj 1000 Unit/ML: INTRAMUSCULAR | Qty: 30 | Status: AC

## 2018-12-07 MED FILL — Magnesium Sulfate Inj 50%: INTRAMUSCULAR | Qty: 10 | Status: AC

## 2018-12-07 NOTE — Progress Notes (Signed)
Order received to discharge patient.  Telemetry monitor removed and CCMD notified.  PIV access removed.  Discharge instructions, follow up, medications and instructions for their use discussed with patient. 

## 2018-12-07 NOTE — Discharge Summary (Addendum)
[place] VALVE TEAM  Discharge Summary    Patient ID: Charles [name] MRN: [id number]; DOB: 02-Mar-1932  Admit date: 12/05/2018 Discharge date: 12/07/2018  Primary Care Provider: [name], MD  Primary Cardiologist: Dr. [name] / Dr. Burt Knack & Dr. Cyndia Bent (TAVR)  Discharge Diagnoses    Principal Problem:   S/P TAVR (transcatheter aortic valve replacement) Active Problems:   Diabetes mellitus type 2, uncontrolled, with complications ([place])   HYPERCHOLESTEROLEMIA   MDD (major depressive disorder), recurrent episode, moderate ([place])   Essential hypertension   GERD   Carotid arterial disease (HCC)   Aortic stenosis, severe   Acute on chronic diastolic CHF (congestive heart failure) ([place])   Coronary artery disease involving native coronary artery of native heart without angina pectoris   Paroxysmal atrial flutter (HCC)   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 St Jude   Allergies Allergies  Allergen Reactions  . Gabapentin Other (See Comments)    Leg pain, weakness   . Metoprolol Swelling  . Spironolactone Other (See Comments)    Painful gynecomastia  . Amlodipine Other (See Comments)    Edema  . Other Other (See Comments)    Horse serum - myalgias  . Rosiglitazone Maleate Other (See Comments)    REACTION: Did not help  . Tricor [Fenofibrate] Other (See Comments)    myalgias    Diagnostic Studies/Procedures    TAVR OPERATIVE NOTE   Date of Procedure:12/05/2018  Preoperative Diagnosis:Severe Aortic Stenosis   Postoperative Diagnosis:Same   Procedure:   Transcatheter Aortic Valve Replacement - PercutaneousLeftTransfemoral Approach Edwards Sapien 3 THV (size 43mm, model # 9600TFX, serial # [id number])  Co-Surgeons:Bryan Alveria Apley, MDand Sherren Mocha, MD   Anesthesiologist:Charlene Nyoka Cowden,  MD  Echocardiographer:Katarina Meda Coffee, Md  Pre-operative Echo Findings: ? Severe aortic stenosis ? mildleft ventricular systolic dysfunction  Post-operative Echo Findings: ? Noparavalvular leak ? mildleft ventricular systolic dysfunction  ________________   Echo 12/06/2018 IMPRESSIONS  1. The left ventricle has normal systolic function, with an ejection fraction of 55-60%. The cavity size was normal. There is moderately increased left ventricular wall thickness. Left ventricular diastolic Doppler parameters are indeterminate There is  abnormal septal motion consistent with RV pacemaker.  2. Left atrial size was moderately dilated.  3. The mitral valve is degenerative. There is moderate to severe mitral annular calcification present. Mild mitral valve stenosis.  4. A 26 an Edwards Edwards Sapien bioprosthetic aortic valve (TAVR) valve is present in the aortic position. Procedure Date: 12/05/2018 Normal aortic valve prosthesis.  5. The inferior vena cava was dilated in size with <50% respiratory variability.  6. The interatrial septum was not well visualized.  7. When compared to the prior study: 09/05/2018: 50-55%, moderate to severe stenosis.   History of Present Illness     Charles [name] is a 85 y.o. male with a history of DMT2, HTN, HLD, DMT2, CAD s/p CABG x4V (2004), PAF on Eliquis, CHB s/p PPM, chronic diastolic CHF, COPD, DDD with reduced mobility, and severe AS who presented to [facility] on 12/05/18 for planned TAVR.   He underwent evaluation for TAVR last year but patient was not ready to pursue valve replacement. Since that time, he has developed progressive dyspnea and fatigue limiting his daily activities.  His most recent echocardiogram on 09/05/2018 showed thickened and calcified aortic valve leaflets with reduced mobility.  The mean gradient was 24 mmHg with a peak gradient of 43 mmHg.  Aortic valve area was measured at 0.75 cm.  Left ventricular ejection  fraction was 50 to 55% with grade 1 diastolic dysfunction.  The mitral valve leaflets were mildly thickened with a mean gradient across the mitral valve of 5 mmHg and a peak of 6 mmHg.  Cardiac catheterization was last done on 10/20/2017 and showed severe three-vessel coronary disease with all 4 bypass grafts being patent.  The mean gradient across aortic valve at that time was 21.5 mmHg.  The patient has been evaluated by the multidisciplinary valve team and felt to have severe, symptomatic aortic stenosis and to be a suitable candidate for TAVR, which was set up for 12/05/2018.   Hospital Course     Consultants: none  Severe AS:s/p successful TAVR with a 26 mm Edwards Sapien 3 THV via the TF approach on 12/05/2018. Post operative showed EF 55%, normally functioning TAVR with mean gradient of 8.5 mmHG and no PVL. Groin sites are stable. ECG with paced rhythm. Continue Asprin and Eliquis. Plan for DC home today with close follow up in the office next week.   Acute on chronic diastolic CHF: as evidenced by an elevated pre op BNP >400 on pre op labs and mild volume overload on exam with elevated JVD and mild crackles. He was treated with one dose of IV lasix 40mg  and then resumed on home Torsemide 20mg  BID. Check BMET next week.   HTN: BP elevated this AM. Resume home meds including Lisinopril 40mg  daily at discharge   CAD s/p CABG: pre TAVR catheterization on 10/20/2017 and showed severe three-vessel coronary disease with all 4 bypass grafts being patent.   DMT2: continue SSI. Hold metformin 48 hours after contrast dye exposure  PAFlutter: ECG with paced rhythm. Resumed on home Eliquis   _____________  Discharge Vitals Blood pressure (!) 174/94, pulse (!) 103, temperature 98.2 F (36.8 C), temperature source Oral, resp. rate (!) 22, height 6\' 1"  (1.854 m), weight 101 kg, SpO2 93 %.  Filed Weights   12/05/18 1946 12/06/18 0356 12/07/18 0337  Weight: 104.2 kg 102.8 kg 101 kg   VS:  BP (!)  174/94 (BP Location: Left Arm)   Pulse (!) 103   Temp 98.2 F (36.8 C) (Oral)   Resp (!) 22   Ht 6\' 1"  (1.854 m)   Wt 101 kg   SpO2 93%   BMI 29.38 kg/m    GEN: Well nourished, well developed, in no acute distress HEENT: normal Neck: no JVD or masses Cardiac: RRR; soft SEM @ RUSB. No rubs, or gallops,no edema  Respiratory:  Diminished lung sounds at bases with some mild wheezing  GI: soft, nontender, nondistended, + BS MS: no deformity or atrophy Skin: warm and dry, no rash. Groin sites soft with mild ecchymosis.  Neuro:  Alert and Oriented x 3, Strength and sensation are intact Psych: euthymic mood, full affect   Labs & Radiologic Studies    CBC Recent Labs    12/06/18 0322 12/07/18 0428  WBC 7.3 6.5  HGB 10.8* 10.8*  HCT 34.6* 33.8*  MCV 96.4 94.4  PLT 145* 785*   Basic Metabolic Panel Recent Labs    12/06/18 0322 12/07/18 0428  NA 137 136  K 4.1 3.7  CL 105 102  CO2 27 26  GLUCOSE 99 86  BUN 22 18  CREATININE 1.21 1.22  CALCIUM 8.6* 8.8*  MG 1.6*  --    Liver Function Tests No results for input(s): AST, ALT, ALKPHOS, BILITOT, PROT, ALBUMIN in the last 72 hours. No results for input(s): LIPASE,  AMYLASE in the last 72 hours. Cardiac Enzymes No results for input(s): CKTOTAL, CKMB, CKMBINDEX, TROPONINI in the last 72 hours. BNP Invalid input(s): POCBNP D-Dimer No results for input(s): DDIMER in the last 72 hours. Hemoglobin A1C No results for input(s): HGBA1C in the last 72 hours. Fasting Lipid Panel No results for input(s): CHOL, HDL, LDLCALC, TRIG, CHOLHDL, LDLDIRECT in the last 72 hours. Thyroid Function Tests No results for input(s): TSH, T4TOTAL, T3FREE, THYROIDAB in the last 72 hours.  Invalid input(s): FREET3 _____________  Dg Chest 2 View  Result Date: 12/01/2018 CLINICAL DATA:  Pre-op for heart valve surgery - hx of CHF, htn, diabetes, CAD, COPD, exsmoker, PNA, dyspnea regularly, EXAM: CHEST - 2 VIEW COMPARISON:  04/22/2018 FINDINGS:  Stable changes from prior CABG surgery. Cardiac silhouette is normal in size. No mediastinal or hilar masses. No convincing adenopathy. Mild elevation of left hemidiaphragm. There is mild opacity at the left lateral lung base consistent with atelectasis. Lungs otherwise clear. No pleural effusion or pneumothorax. Left anterior chest wall pacemaker is stable and well positioned. Skeletal structures are demineralized but grossly intact. IMPRESSION: No acute cardiopulmonary disease. Electronically Signed   By: Lajean Manes M.D.   On: 12/01/2018 15:41   Dg Chest Port 1 View  Result Date: 12/05/2018 CLINICAL DATA:  Status post TAVR. EXAM: PORTABLE CHEST 1 VIEW COMPARISON:  Chest radiograph December 01, 2018 FINDINGS: Low inspiratory examination. New aortic stent. Stable cardiomegaly. Status post median sternotomy for CABG. Calcified aortic arch. Stable rounded density LEFT lung base atelectasis versus scarring. No pneumothorax. LEFT cardiac pacemaker in situ. RIGHT internal jugular central venous catheter distal tip obscured by pacemaker leads. Osteopenia. IMPRESSION: 1. Interval TAVR. 2. Stable cardiomegaly.  No acute pulmonary process. Electronically Signed   By: Elon Alas M.D.   On: 12/05/2018 18:31   Disposition   Pt is being discharged home today in good condition.  Follow-up Plans & Appointments    Follow-up Information    Eileen Stanford, PA-C. Go on 12/14/2018.   Specialties:  Cardiology, Radiology Why:  @ 2:30pm. Please arrive at least 10 minutes early.  Contact information: [address] [place] [address] [phone]           Discharge Medications   Allergies as of 12/07/2018      Reactions   Gabapentin Other (See Comments)   Leg pain, weakness   Metoprolol Swelling   Spironolactone Other (See Comments)   Painful gynecomastia   Amlodipine Other (See Comments)   Edema   Other Other (See Comments)   Horse serum - myalgias   Rosiglitazone  Maleate Other (See Comments)   REACTION: Did not help   Tricor [fenofibrate] Other (See Comments)   myalgias      Medication List    TAKE these medications   acetaminophen 500 MG tablet Commonly known as:  TYLENOL Take 1 tablet (500 mg total) by mouth 3 (three) times daily.   aspirin 81 MG chewable tablet Chew 1 tablet (81 mg total) by mouth daily.   atorvastatin 20 MG tablet Commonly known as:  LIPITOR Take 1 tablet (20 mg total) by mouth daily.   citalopram 20 MG tablet Commonly known as:  CELEXA Take 1 tablet (20 mg total) by mouth daily.   ELIQUIS 5 MG Tabs tablet Generic drug:  apixaban Take 1 tablet (5 mg total) by mouth 2 (two) times daily. What changed:  See the new instructions.   finasteride 5 MG tablet Commonly known as:  PROSCAR Take 1 tablet (5 mg total) by mouth daily.   glucose blood test strip Commonly known as:  ACCU-CHEK AVIVA PLUS 1 each by Other route as needed for other. Use as instructed to check blood sugar 3 (three) times a day.  Dx code:  E11.8, E11.65   insulin glargine 100 UNIT/ML injection Commonly known as:  LANTUS Inject 20 Units into the skin at bedtime.   lisinopril 40 MG tablet Commonly known as:  PRINIVIL,ZESTRIL Take 1 tablet (40 mg total) by mouth daily.   metFORMIN 1000 MG tablet Commonly known as:  GLUCOPHAGE Take 1 tablet (1,000 mg total) by mouth 2 (two) times daily with a meal. Notes to patient:  Hold 48 hours after TAVR procedure   nitroGLYCERIN 0.4 MG SL tablet Commonly known as:  NITROSTAT Place 1 tablet (0.4 mg total) under the tongue every 5 (five) minutes as needed for chest pain.   pantoprazole 20 MG tablet Commonly known as:  PROTONIX Take 1 tablet (20 mg total) by mouth daily. What changed:  See the new instructions.   Potassium Chloride ER 20 MEQ Tbcr Take 20 mEq by mouth every Monday, Wednesday, Friday, Saturday, and Sunday at 6 PM.   tamsulosin 0.4 MG Caps capsule Commonly known as:  FLOMAX Take 1  capsule (0.4 mg total) by mouth daily after supper.   torsemide 20 MG tablet Commonly known as:  DEMADEX Take 40 mg by mouth twice daily if weight>205 lb; OTHERWISE Take 20 mg by mouth twice daily if weight is 200-205 lb on home weight scale. What changed:    how much to take  how to take this  when to take this   traZODone 50 MG tablet Commonly known as:  DESYREL Take 50 mg by mouth at bedtime.         Outstanding Labs/Studies   BMET  Duration of Discharge Encounter   Greater than 30 minutes including physician time.  Mable Fill, PA-C 12/07/2018, 8:53 AM [phone]  Patient seen, examined. Available data reviewed. Agree with findings, assessment, and plan as outlined by Nell Range, PA-C.  The patient is independently interviewed and examined this morning.  JVP is normal, lung fields have scattered rhonchi but no crackles, heart is regular rate and rhythm with a grade 2/6 systolic ejection murmur at the right upper sternal border.  Bilateral groin sites have mild ecchymoses but no hematoma or tenderness.  There is no pretibial edema.  Postoperative echo shows normal function of his transcatheter heart valve.  The patient is stable for discharge.  He has received IV diuresis as he presented with acute on chronic diastolic heart failure.  He is now back on his home dose of torsemide.  Discharge plans as outlined above.  Sherren Mocha, M.D. 12/07/2018 9:38 PM

## 2018-12-07 NOTE — Progress Notes (Signed)
CARDIAC REHAB PHASE I   PRE:  Rate/Rhythm: 87  BP:  Sitting: 163/84      SaO2: 93 RA  MODE:  Ambulation: 100 ft   POST:  Rate/Rhythm: 114  BP:  Sitting: 162/80    SaO2: 94 RA   Pt ambulated 126ft in hallway assist of 2 with rollator and gait belt. Pt with fast pace, needed reminders to slow down. Pt maintained sats throughout walk, but was visbily SOB at end of walk. Pt educated on site care and restrictions. Encouraged ambulation as able, and continued IS use. Pt declining CRP II at this time.   [phone] Rufina Falco, RN BSN 12/07/2018 10:23 AM

## 2018-12-07 NOTE — Progress Notes (Signed)
Attempted to walk pt this AM prior to returning to bed but pt unable 2/2 dyspnea. SpO2 stable on 2L [place] (90-95%). Will convey to Day RN.

## 2018-12-08 ENCOUNTER — Telehealth: Payer: Self-pay | Admitting: Physician Assistant

## 2018-12-08 NOTE — Telephone Encounter (Signed)
  [place] VALVE TEAM   Patient contacted regarding discharge from [facility] on 12/07/2018  Patient understands to follow up with provider Nell Range on 3/12 @ 2:30pm at [place].  Patient understands discharge instructions? yes Patient understands medications and regiment? yes Patient understands to bring all medications to this visit? yes  Angelena Form PA-C  [facility]

## 2018-12-12 ENCOUNTER — Other Ambulatory Visit: Payer: Self-pay | Admitting: Family Medicine

## 2018-12-13 ENCOUNTER — Encounter: Payer: Self-pay | Admitting: Internal Medicine

## 2018-12-13 NOTE — Progress Notes (Signed)
[facility] [place]                                       Cardiology Office Note    Date:  12/14/2018   ID:  BRECKEN DEWOODY, DOB 1932/05/19, MRN [id number]  PCP:  [name], MD  Cardiologist:  Dr. [name] / Dr. Burt Knack & Dr. Cyndia Bent (TAVR)  CC: [place] s/p TAVR  History of Present Illness:  EVEN BUDLONG is a 85 y.o. male with a history of DMT2, HTN, HLD,DMT2,CAD s/p CABG x4V (2004), PAF on Eliquis, CHB s/p PPM, chronic diastolic CHF, COPD, DDD with reduced mobility, and severe AS s/p TAVR (12/05/18) who presents to clinic for follow up.   He underwent evaluation for TAVR last year but patient was not ready to pursue valve replacement. Since that time, he has developed progressive dyspnea and fatigue limiting his daily activities. His most recent echocardiogram on 09/05/2018 showed thickened and calcified aortic valve leaflets with reduced mobility. The mean gradient was 24 mmHg with a peak gradient of 43 mmHg. Aortic valve area was measured at 0.75 cm. Left ventricular ejection fraction was 50 to 55% with grade 1 diastolic dysfunction. The mitral valve leaflets were mildly thickened with a mean gradient across the mitral valve of 5 mmHg and a peak of 6 mmHg. Cardiac catheterization was last done on 10/20/2017 and showed severe three-vessel coronary disease with all 4 bypass grafts being patent. The mean gradient across aortic valve at that time was 21.5 mmHg.  He underwent successful TAVR with a84mm Edwards Sapien 3 THV via the TF approach on 12/05/2018. Post operative showed EF 55%, normally functioning TAVR with mean gradient of 8.5 mmHG and no PVL. He has some mild volume overload and treated with one dose of IV lasix. He was discharged home on aspirin and eliquis. Of note 40 beats of NSVT found on device interrogation.   Today he presents to clinic for follow up. Here with daughter in law. Since discharge he has developed worsening edema  in the left side of his body in arm and leg. No CP or SOB. Improved orthopnea. No PND. No dizziness or syncope. No blood in stool or urine. No palpitations. His breathing has improved a lot since his TAVR but still had some mild orthopnea mid way through the night where he used to have it all night.    Past Medical History:  Diagnosis Date  . Abdominal aortic atherosclerosis ([place])    by xray  . Arthritis    in lower back (06/24/2015)  . Atrial flutter ([place])    notes 06/24/2015  . AV block, [place] 1 09/06/2017   Archie Endo 09/06/2017  . 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 CAD (coronary artery disease) 2004   a. s/p CABG in 2004 with LIMA-LAD, SVG-RCA, SVG-OM, and SVG-RI  . Carotid stenosis 1999   s/p L CEA  . Chronic diastolic CHF (congestive heart failure) ([place])   . Colon polyp 2005   (Dr. Tiffany Kocher)  . Compression fracture of L1 lumbar vertebra ([facility]) remote  . COPD (chronic obstructive pulmonary disease) ([place]) 03/2011   by xray  . Depression    hx  . GERD (gastroesophageal reflux disease) 2003   h/o duodenal ulcer per EGD as well as esophagitis  . History of colon polyps 2003, 2005   adenomatous (  [place])  . History of tobacco abuse   . HLD (hyperlipidemia)   . HTN (hypertension)   . Presence of permanent cardiac pacemaker   . Psoriasis   . Renal artery stenosis ([place]) 2004   70% bilateral, followed by cards  . 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 Severe aortic stenosis   . T2DM (type 2 diabetes mellitus) ([place]) 1995  . Urge incontinence of urine     Past Surgical History:  Procedure Laterality Date  . CARDIAC CATHETERIZATION  03/06/2003   No intervention - recommend CABG  . CARDIOVASCULAR STRESS TEST  11/2010   normal perfusion, no evidence of ischemia, EF 62% post exercise  . CARDIOVASCULAR STRESS TEST  11/28/2012   Mild  diaphragmatic attenuation; cannot exclude a focal region of nontransmural inferior scar  . CARDIOVERSION N/A 06/25/2015   Procedure: CARDIOVERSION;  Surgeon: Pixie Casino, MD;  Location: [facility] ENDOSCOPY;  Service: Cardiovascular;  Laterality: N/A;  . CARDIOVERSION N/A 11/02/2016   Procedure: CARDIOVERSION;  Surgeon: [name], MD;  Location: [facility];  Service: Cardiovascular;  Laterality: N/A;  . CAROTID ENDARTERECTOMY Left 1999   ([place])  . CATARACT EXTRACTION W/ INTRAOCULAR LENS  IMPLANT, BILATERAL Bilateral 01/2013   [place]  . COLONOSCOPY  2003   colon polyp x3 - adenomatous Tiffany Kocher)  . COLONOSCOPY  10/08/2012   2 TA, diverticulosis, int hem, no rpt rec Tiffany Kocher)  . CORONARY ANGIOPLASTY    . CORONARY ARTERY BYPASS GRAFT  03/07/2003   4v CABG ([place]) with LIMA to LAD, vein graft to RCA, 1st obtuse marginal, and ramus intermedius  . ESOPHAGOGASTRODUODENOSCOPY  10/08/2012   nl esophagus, duodenitis and erosive gastropathy, path - gastropathy no Hpylori, no rpt rec  . LUMBAR EPIDURAL INJECTION  03/2017   L5/S1 ([place])  . PACEMAKER IMPLANT N/A 04/21/2018   Procedure: PACEMAKER IMPLANT -- Dual Chamber;  Surgeon: Deboraha Sprang, MD;  Location: [facility];  Service: Cardiovascular;  Laterality: N/A;  . PERIPHERAL VASCULAR CATHETERIZATION N/A 02/02/2016   Procedure: Renal Angiography;  Surgeon: [name], MD;  Location: [place] [facility];  Service: Cardiovascular;  Laterality: N/A;  . PERIPHERAL VASCULAR CATHETERIZATION N/A 02/02/2016   Procedure: Abdominal Aortogram;  Surgeon: [name], MD;  Location: [facility];  Service: Cardiovascular;  Laterality: N/A;  . RENAL DOPPLER  11/29/2011   Celiac&SMA-demonstrated vessel narrowing suggestive of a greater than 50% diameter reduction. Bilateral renal arteries-demonstrated vessel narrowing of 60-99% diameter reduction. Rt Kidney-mid pole lateral simple cyst noted measuring 1.29x0.76x1.11cm and exophytic cyst outside  lower pole measuring 1.23x0.96x1.31. Lft Kidney-lateral mid to lower pole simple cyst measuering-1.24x9.83x1.24  . RIGHT/LEFT HEART CATH AND CORONARY/GRAFT ANGIOGRAPHY N/A 10/20/2017   Procedure: RIGHT/LEFT HEART CATH AND CORONARY/GRAFT ANGIOGRAPHY;  Surgeon: Burnell Blanks, MD;  Location: [place] [facility];  Service: Cardiovascular;  Laterality: N/A;  . TEE WITHOUT CARDIOVERSION N/A 06/25/2015   Procedure: TRANSESOPHAGEAL ECHOCARDIOGRAM (TEE);  Surgeon: Pixie Casino, MD;  Location: [facility] ENDOSCOPY;  Service: Cardiovascular;  Laterality: N/A;  . TEE WITHOUT CARDIOVERSION N/A 11/02/2016   Procedure: TRANSESOPHAGEAL ECHOCARDIOGRAM (TEE);  Surgeon: [name], MD;  Location: [facility];  Service: Cardiovascular;  Laterality: N/A;  . TEE WITHOUT CARDIOVERSION N/A 12/05/2018   Procedure: TRANSESOPHAGEAL ECHOCARDIOGRAM (TEE);  Surgeon: Sherren Mocha, MD;  Location: [facility];  Service: Open Heart Surgery;  Laterality: N/A;  . TONSILLECTOMY    .  TRANSCATHETER AORTIC VALVE REPLACEMENT, TRANSFEMORAL  12/2018   [place]  . TRANSCATHETER AORTIC VALVE REPLACEMENT, TRANSFEMORAL N/A 12/05/2018   Procedure: TRANSCATHETER AORTIC VALVE REPLACEMENT, TRANSFEMORAL;  Surgeon: Sherren Mocha, MD;  Location: [facility];  Service: Open Heart Surgery;  Laterality: N/A;  . UPPER GASTROINTESTINAL ENDOSCOPY  2003   reflux esophagitis, erosive gastropathy, duodenal ulcer    Current Medications: Outpatient Medications Prior to Visit  Medication Sig Dispense Refill  . acetaminophen (TYLENOL) 500 MG tablet Take 1 tablet (500 mg total) by mouth 3 (three) times daily.    [name] aspirin 81 MG chewable tablet Chew 1 tablet (81 mg total) by mouth daily.    [name] atorvastatin (LIPITOR) 20 MG tablet Take 1 tablet (20 mg total) by mouth daily. 90 tablet 3  . citalopram (CELEXA) 20 MG tablet Take 1 tablet (20 mg total) by mouth daily. 90 tablet 3  . ELIQUIS 5 MG TABS tablet Take 1 tablet (5 mg total) by mouth 2 (two) times daily. 60  tablet 3  . finasteride (PROSCAR) 5 MG tablet Take 1 tablet (5 mg total) by mouth daily. 30 tablet 2  . glucose blood (ACCU-CHEK AVIVA PLUS) test strip 1 each by Other route as needed for other. Use as instructed to check blood sugar 3 (three) times a day.  Dx code:  E11.8, E11.65 300 each 0  . insulin glargine (LANTUS) 100 UNIT/ML injection Inject 20 Units into the skin at bedtime.     . metFORMIN (GLUCOPHAGE) 1000 MG tablet Take 1 tablet (1,000 mg total) by mouth 2 (two) times daily with a meal. 180 tablet 1  . nitroGLYCERIN (NITROSTAT) 0.4 MG SL tablet Place 1 tablet (0.4 mg total) under the tongue every 5 (five) minutes as needed for chest pain. 90 tablet 3  . pantoprazole (PROTONIX) 20 MG tablet Take 1 tablet (20 mg total) by mouth daily. 30 tablet 6  . Potassium Chloride ER 20 MEQ TBCR Take 20 mEq by mouth every Monday, Wednesday, Friday, Saturday, and Sunday at 6 PM. 30 tablet 5  . tamsulosin (FLOMAX) 0.4 MG CAPS capsule Take 1 capsule (0.4 mg total) by mouth daily after supper. 30 capsule 6  . traZODone (DESYREL) 50 MG tablet Take 50 mg by mouth at bedtime.    . torsemide (DEMADEX) 20 MG tablet Take 40 mg by mouth twice daily if weight>205 lb; OTHERWISE Take 20 mg by mouth twice daily if weight is 200-205 lb on home weight scale. (Patient taking differently: Take 20 mg by mouth 2 (two) times daily. ) 120 tablet 3  . lisinopril (PRINIVIL,ZESTRIL) 40 MG tablet Take 1 tablet (40 mg total) by mouth daily. (Patient not taking: Reported on 12/14/2018) 90 tablet 2   No facility-administered medications prior to visit.      Allergies:   Gabapentin; Metoprolol; Spironolactone; Amlodipine; Other; Rosiglitazone maleate; and Tricor [fenofibrate]   Social History   Socioeconomic History  . Marital status: Widowed    Spouse name: Not on file  . Number of children: Not on file  . Years of education: Not on file  . Highest education level: Not on file  Occupational History  . Not on file  Social  Needs  . Financial resource strain: Not on file  . Food insecurity:    Worry: Not on file    Inability: Not on file  . Transportation needs:    Medical: Not on file    Non-medical: Not on file  Tobacco Use  . Smoking status: Former Smoker  Packs/day: 0.50    Years: 36.00    Pack years: 18.00    Types: Cigarettes    Last attempt to quit: 07/23/2013    Years since quitting: 5.3  . Smokeless tobacco: Never Used  Substance and Sexual Activity  . Alcohol use: No  . Drug use: No  . Sexual activity: Not Currently  Lifestyle  . Physical activity:    Days per week: Not on file    Minutes per session: Not on file  . Stress: Not on file  Relationships  . Social connections:    Talks on phone: Not on file    Gets together: Not on file    Attends religious service: Not on file    Active member of club or organization: Not on file    Attends meetings of clubs or organizations: Not on file    Relationship status: Not on file  Other Topics Concern  . Not on file  Social History Narrative   Caffeine: 4 cups coffee/day, some tea and soda   Lives with wife   Occupation: Retired   Activity: no regular exercise   Diet: good water, fruits/vegetables daily     Family History:  The patient's family history includes Cancer in his brother, brother, maternal grandmother, and mother; Diabetes in his sister; Heart attack in his maternal grandfather, paternal grandfather, and paternal grandmother; Heart disease in his father and sister; Hypertension in his father and sister; Lung disease in his brother; Stroke in his sister.      ROS:   Please see the history of present illness.    ROS All other systems reviewed and are negative.   PHYSICAL EXAM:   VS:  BP 140/70   Pulse 81   Ht 6\' 1"  (1.854 m)   Wt 226 lb 6.4 oz (102.7 kg)   SpO2 97%   BMI 29.87 kg/m    GEN: Well nourished, well developed, in no acute distress HEENT: normal Neck: no JVD or masses Cardiac: RRR; soft flow murmur. No  rubs, or gallops, 2+ pitting edema in left arm/hand and left leg Respiratory:  clear to auscultation bilaterally, normal work of breathing GI: soft, nontender, nondistended, + BS MS: no deformity or atrophy Skin: warm and dry, no rash. Groin sites healing with with no hematoma or ecchymosis  Neuro:  Alert and Oriented x 3, Strength and sensation are intact Psych: euthymic mood, full affect   Wt Readings from Last 3 Encounters:  12/14/18 226 lb 6.4 oz (102.7 kg)  12/07/18 222 lb 11.2 oz (101 kg)  12/01/18 223 lb 8 oz (101.4 kg)      Studies/Labs Reviewed:   EKG:  EKG is NOT ordered today.    Recent Labs: 03/08/2018: NT-Pro BNP 843 03/25/2018: TSH 1.604 12/01/2018: ALT 18; B Natriuretic Peptide 411.5 12/06/2018: Magnesium 1.6 12/07/2018: BUN 18; Creatinine, Ser 1.22; Hemoglobin 10.8; Platelets 132; Potassium 3.7; Sodium 136   Lipid Panel    Component Value Date/Time   CHOL 131 06/08/2016 1423   TRIG 95.0 06/08/2016 1423   TRIG 133 09/10/2011   HDL 39.50 06/08/2016 1423   CHOLHDL 3 06/08/2016 1423   VLDL 19.0 06/08/2016 1423   LDLCALC 73 06/08/2016 1423   LDLDIRECT 77 09/10/2011    Additional studies/ records that were reviewed today include:  TAVR OPERATIVE NOTE   Date of Procedure:12/05/2018  Preoperative Diagnosis:Severe Aortic Stenosis   Postoperative Diagnosis:Same   Procedure:   Transcatheter Aortic Valve Replacement - PercutaneousLeftTransfemoral Approach Edwards Sapien 3 THV (size  72mm, model # [id number], serial # [id number])  Co-Surgeons:Bryan Alveria Apley, MDand Sherren Mocha, MD   Anesthesiologist:Charlene Nyoka Cowden, MD  Echocardiographer:Katarina Meda Coffee, Md  Pre-operative Echo Findings: ? Severe aortic stenosis ? mildleft ventricular systolic dysfunction  Post-operative Echo Findings: ? Noparavalvular leak ? mildleft ventricular systolic  dysfunction  ________________   Echo 12/06/2018 IMPRESSIONS 1. The left ventricle has normal systolic function, with an ejection fraction of 55-60%. The cavity size was normal. There is moderately increased left ventricular wall thickness. Left ventricular diastolic Doppler parameters are indeterminate There is  abnormal septal motion consistent with RV pacemaker. 2. Left atrial size was moderately dilated. 3. The mitral valve is degenerative. There is moderate to severe mitral annular calcification present. Mild mitral valve stenosis. 4. A 26 an Edwards Edwards Sapien bioprosthetic aortic valve (TAVR) valve is present in the aortic position. Procedure Date: 12/05/2018 Normal aortic valve prosthesis. 5. The inferior vena cava was dilated in size with <50% respiratory variability. 6. The interatrial septum was not well visualized. 7. When compared to the prior study: 09/05/2018: 50-55%, moderate to severe stenosis.   ASSESSMENT & PLAN:   Severe AS s/p TAVR:doing well with an improvement in symptoms. Still having some shortness of breath but much improved. Groin sites are healing well. SBE prophylaxis discussed; the patient is edentulous and does not go to the dentist. Continue on Asprin and Eliquis. I will see him back 4/9 for 1 month follow up and echo.   Acute on chronic diastolic CHF: he has had worsening edema in left leg and left arm/hand. He was discharged on torsemide 20mg  BID but was only taking it daily. Plan to increase It to 20mg  BID and he will take 40mg  tomorrow AM. BMET today.   HTN: BP mildly elevated. Lisinopril on his med list but he hasn't been taking. We will add this back now. BMET today  CAD s/p CABG: pre TAVRcatheterization on 10/20/2017 and showed severe three-vessel coronary disease with all 4 bypass grafts being patent.  PAFlutter: maintaining sinus. Continue eliquis for thromboembolic prophylaxis.   NSVT: 40 beats noted on device interrogation  S/p  PPM: followed by Dr. Caryl Comes  Medication Adjustments/Labs and Tests Ordered: Current medicines are reviewed at length with the patient today.  Concerns regarding medicines are outlined above.  Medication changes, Labs and Tests ordered today are listed in the Patient Instructions below. Patient Instructions  Medication Instructions:  1) Please RESUME LISINOPRIL 40 mg daily 2) Take DEMADEX 20 mg twice daily. Tomorrow morning, 3/13, please take an extra 20 mg one time only.  Labwork: TODAY: BMET  Follow-Up: Please keep your appointments on April 9 for your 1 month TAVR echo and office visit. Please arrive by 12:45PM.    Signed, Angelena Form, PA-C  12/14/2018 4:42 PM    [address], [place], [place]  [address] Phone: [phone]; Fax: [phone]

## 2018-12-13 NOTE — Progress Notes (Signed)
[facility] alert for [facility] episode. Pt with episode of NSVT 12/13/18, duration 40 beats. EF normal by recent post TAVR echo. Pt has appt with Nell Range, PA 3/12. Routed to structural team and Dr Caryl Comes for review.  Pt needs device interrogated at appt tomorrow to evaluate RV threshold (turn autocapture off).     Chanetta Marshall, NP 12/13/2018 2:21 PM

## 2018-12-14 ENCOUNTER — Ambulatory Visit (INDEPENDENT_AMBULATORY_CARE_PROVIDER_SITE_OTHER): Payer: Medicare Other | Admitting: Nurse Practitioner

## 2018-12-14 ENCOUNTER — Ambulatory Visit: Payer: Medicare Other | Admitting: Physician Assistant

## 2018-12-14 ENCOUNTER — Encounter: Payer: Self-pay | Admitting: Physician Assistant

## 2018-12-14 ENCOUNTER — Other Ambulatory Visit: Payer: Self-pay

## 2018-12-14 VITALS — BP 140/70 | HR 81 | Ht 73.0 in | Wt 226.4 lb

## 2018-12-14 DIAGNOSIS — I2510 Atherosclerotic heart disease of native coronary artery without angina pectoris: Secondary | ICD-10-CM

## 2018-12-14 DIAGNOSIS — I10 Essential (primary) hypertension: Secondary | ICD-10-CM | POA: Diagnosis not present

## 2018-12-14 DIAGNOSIS — I483 Typical atrial flutter: Secondary | ICD-10-CM | POA: Diagnosis not present

## 2018-12-14 DIAGNOSIS — Z952 Presence of prosthetic heart valve: Secondary | ICD-10-CM

## 2018-12-14 DIAGNOSIS — Z950 Presence of cardiac pacemaker: Secondary | ICD-10-CM

## 2018-12-14 DIAGNOSIS — I4729 Other ventricular tachycardia: Secondary | ICD-10-CM

## 2018-12-14 DIAGNOSIS — I5022 Chronic systolic (congestive) heart failure: Secondary | ICD-10-CM | POA: Diagnosis not present

## 2018-12-14 DIAGNOSIS — I441 Atrioventricular block, second degree: Secondary | ICD-10-CM | POA: Diagnosis not present

## 2018-12-14 DIAGNOSIS — I472 Ventricular tachycardia: Secondary | ICD-10-CM

## 2018-12-14 LAB — CUP PACEART INCLINIC DEVICE CHECK
Battery Remaining Longevity: 93 mo
Battery Voltage: 3.02 V
Brady Statistic RA Percent Paced: 1.7 %
Brady Statistic RV Percent Paced: 99.25 %
Date Time Interrogation Session: 20200312144847
Implantable Lead Implant Date: 20190719
Implantable Lead Implant Date: 20190719
Implantable Lead Location: 753859
Implantable Lead Location: 753860
Implantable Lead Model: 5076
Implantable Lead Model: 5076
Implantable Pulse Generator Implant Date: 20190719
Lead Channel Impedance Value: 437.5 Ohm
Lead Channel Impedance Value: 475 Ohm
Lead Channel Pacing Threshold Amplitude: 0.5 V
Lead Channel Pacing Threshold Amplitude: 0.5 V
Lead Channel Pacing Threshold Pulse Width: 0.5 ms
Lead Channel Pacing Threshold Pulse Width: 0.5 ms
Lead Channel Pacing Threshold Pulse Width: 0.5 ms
Lead Channel Sensing Intrinsic Amplitude: 12 mV
Lead Channel Sensing Intrinsic Amplitude: 5 mV
Lead Channel Setting Pacing Amplitude: 1.5 V
Lead Channel Setting Pacing Amplitude: 2.5 V
Lead Channel Setting Sensing Sensitivity: 2 mV
MDC IDC MSMT LEADCHNL RV PACING THRESHOLD AMPLITUDE: 0.5 V
MDC IDC SET LEADCHNL RV PACING PULSEWIDTH: 0.5 ms
Pulse Gen Model: 2272
Pulse Gen Serial Number: 9040268

## 2018-12-14 MED ORDER — TORSEMIDE 20 MG PO TABS: 20.0000 mg | ORAL_TABLET | Freq: Two times a day (BID) | ORAL | 11 refills | Status: DC

## 2018-12-14 MED ORDER — LISINOPRIL 40 MG PO TABS: 40.0000 mg | ORAL_TABLET | Freq: Every day | ORAL | 11 refills | Status: DC

## 2018-12-14 NOTE — Patient Instructions (Addendum)
Medication Instructions:  1) Please RESUME LISINOPRIL 40 mg daily 2) Take DEMADEX 20 mg twice daily. Tomorrow morning, 3/13, please take an extra 20 mg one time only.  Labwork: TODAY: BMET  Follow-Up: Please keep your appointments on April 9 for your 1 month TAVR echo and office visit. Please arrive by 12:45PM.

## 2018-12-14 NOTE — Progress Notes (Signed)
Thanks. Katie seeing him today

## 2018-12-14 NOTE — Progress Notes (Signed)
Pacemaker check in clinic. Normal device function. Thresholds, sensing, impedances consistent with previous measurements. Device programmed to maximize longevity. 1 HVR episode (see documentation 3/11 remote).  Device programmed at appropriate safety margins. Histogram distribution appropriate for patient activity level. Device programmed to optimize intrinsic conduction. RV autocapture turned off today. Output set at 2.5V.

## 2018-12-15 ENCOUNTER — Other Ambulatory Visit: Payer: Self-pay | Admitting: Internal Medicine

## 2018-12-15 LAB — BASIC METABOLIC PANEL
BUN/Creatinine Ratio: 19 (ref 10–24)
BUN: 22 mg/dL (ref 8–27)
CHLORIDE: 99 mmol/L (ref 96–106)
CO2: 30 mmol/L — ABNORMAL HIGH (ref 20–29)
Calcium: 9.4 mg/dL (ref 8.6–10.2)
Creatinine, Ser: 1.16 mg/dL (ref 0.76–1.27)
GFR calc Af Amer: 66 mL/min/{1.73_m2} (ref >59)
GFR calc non Af Amer: 58 mL/min/{1.73_m2} — ABNORMAL LOW (ref >59)
Glucose: 104 mg/dL — ABNORMAL HIGH (ref 65–99)
Potassium: 4.3 mmol/L (ref 3.5–5.2)
Sodium: 143 mmol/L (ref 134–144)

## 2018-12-16 NOTE — Progress Notes (Signed)
Echo unchanged K within normal range Worth maybe checking Mg Otherwise would do nthing different

## 2018-12-18 NOTE — Progress Notes (Signed)
I will check mag at next OV.

## 2018-12-25 ENCOUNTER — Emergency Department (HOSPITAL_COMMUNITY): Payer: Medicare Other

## 2018-12-25 ENCOUNTER — Other Ambulatory Visit: Payer: Self-pay

## 2018-12-25 ENCOUNTER — Observation Stay (HOSPITAL_COMMUNITY)
Admission: EM | Admit: 2018-12-25 | Discharge: 2018-12-27 | Disposition: A | Discharge status: Home / Self Care | Payer: Medicare Other | Attending: Cardiology | Admitting: Cardiology

## 2018-12-25 ENCOUNTER — Telehealth: Payer: Self-pay | Admitting: Cardiovascular Disease

## 2018-12-25 ENCOUNTER — Encounter (HOSPITAL_COMMUNITY): Payer: Self-pay

## 2018-12-25 DIAGNOSIS — R000 Tachycardia, unspecified: Secondary | ICD-10-CM | POA: Diagnosis not present

## 2018-12-25 DIAGNOSIS — Z952 Presence of prosthetic heart valve: Secondary | ICD-10-CM

## 2018-12-25 DIAGNOSIS — I130 Hypertensive heart and chronic kidney disease with heart failure and stage 1 through stage 4 chronic kidney disease, or unspecified chronic kidney disease: Secondary | ICD-10-CM | POA: Diagnosis not present

## 2018-12-25 DIAGNOSIS — N182 Chronic kidney disease, stage 2 (mild): Secondary | ICD-10-CM

## 2018-12-25 DIAGNOSIS — Z79899 Other long term (current) drug therapy: Secondary | ICD-10-CM | POA: Insufficient documentation

## 2018-12-25 DIAGNOSIS — R0602 Shortness of breath: Secondary | ICD-10-CM | POA: Diagnosis present

## 2018-12-25 DIAGNOSIS — E785 Hyperlipidemia, unspecified: Secondary | ICD-10-CM | POA: Diagnosis not present

## 2018-12-25 DIAGNOSIS — I350 Nonrheumatic aortic (valve) stenosis: Secondary | ICD-10-CM | POA: Insufficient documentation

## 2018-12-25 DIAGNOSIS — Z951 Presence of aortocoronary bypass graft: Secondary | ICD-10-CM | POA: Insufficient documentation

## 2018-12-25 DIAGNOSIS — Z794 Long term (current) use of insulin: Secondary | ICD-10-CM | POA: Diagnosis not present

## 2018-12-25 DIAGNOSIS — J449 Chronic obstructive pulmonary disease, unspecified: Secondary | ICD-10-CM | POA: Insufficient documentation

## 2018-12-25 DIAGNOSIS — I2510 Atherosclerotic heart disease of native coronary artery without angina pectoris: Secondary | ICD-10-CM | POA: Diagnosis not present

## 2018-12-25 DIAGNOSIS — Z87891 Personal history of nicotine dependence: Secondary | ICD-10-CM | POA: Insufficient documentation

## 2018-12-25 DIAGNOSIS — I480 Paroxysmal atrial fibrillation: Secondary | ICD-10-CM | POA: Insufficient documentation

## 2018-12-25 DIAGNOSIS — E1165 Type 2 diabetes mellitus with hyperglycemia: Secondary | ICD-10-CM | POA: Diagnosis not present

## 2018-12-25 DIAGNOSIS — E118 Type 2 diabetes mellitus with unspecified complications: Secondary | ICD-10-CM

## 2018-12-25 DIAGNOSIS — N183 Chronic kidney disease, stage 3 (moderate): Secondary | ICD-10-CM | POA: Diagnosis not present

## 2018-12-25 DIAGNOSIS — E1122 Type 2 diabetes mellitus with diabetic chronic kidney disease: Secondary | ICD-10-CM | POA: Diagnosis not present

## 2018-12-25 DIAGNOSIS — Z888 Allergy status to other drugs, medicaments and biological substances status: Secondary | ICD-10-CM | POA: Diagnosis not present

## 2018-12-25 DIAGNOSIS — R0609 Other forms of dyspnea: Secondary | ICD-10-CM | POA: Diagnosis not present

## 2018-12-25 DIAGNOSIS — I5033 Acute on chronic diastolic (congestive) heart failure: Secondary | ICD-10-CM | POA: Diagnosis not present

## 2018-12-25 DIAGNOSIS — R0689 Other abnormalities of breathing: Secondary | ICD-10-CM | POA: Diagnosis not present

## 2018-12-25 DIAGNOSIS — R0600 Dyspnea, unspecified: Secondary | ICD-10-CM

## 2018-12-25 DIAGNOSIS — R069 Unspecified abnormalities of breathing: Secondary | ICD-10-CM | POA: Diagnosis not present

## 2018-12-25 DIAGNOSIS — Z950 Presence of cardiac pacemaker: Secondary | ICD-10-CM | POA: Insufficient documentation

## 2018-12-25 DIAGNOSIS — IMO0002 Reserved for concepts with insufficient information to code with codable children: Secondary | ICD-10-CM | POA: Diagnosis present

## 2018-12-25 DIAGNOSIS — Z7982 Long term (current) use of aspirin: Secondary | ICD-10-CM | POA: Insufficient documentation

## 2018-12-25 DIAGNOSIS — Z7901 Long term (current) use of anticoagulants: Secondary | ICD-10-CM

## 2018-12-25 DIAGNOSIS — I5032 Chronic diastolic (congestive) heart failure: Secondary | ICD-10-CM | POA: Diagnosis present

## 2018-12-25 LAB — CBC WITH DIFFERENTIAL/PLATELET
Abs Immature Granulocytes: 0.03 10*3/uL (ref 0.00–0.07)
BASOS ABS: 0 10*3/uL (ref 0.0–0.1)
Basophils Relative: 1 %
Eosinophils Absolute: 0.3 10*3/uL (ref 0.0–0.5)
Eosinophils Relative: 4 %
HCT: 37.6 % — ABNORMAL LOW (ref 39.0–52.0)
Hemoglobin: 11.7 g/dL — ABNORMAL LOW (ref 13.0–17.0)
Immature Granulocytes: 1 %
Lymphocytes Relative: 19 %
Lymphs Abs: 1.2 10*3/uL (ref 0.7–4.0)
MCH: 28.8 pg (ref 26.0–34.0)
MCHC: 31.1 g/dL (ref 30.0–36.0)
MCV: 92.6 fL (ref 80.0–100.0)
Monocytes Absolute: 0.8 10*3/uL (ref 0.1–1.0)
Monocytes Relative: 12 %
NRBC: 0 % (ref 0.0–0.2)
Neutro Abs: 3.9 10*3/uL (ref 1.7–7.7)
Neutrophils Relative %: 63 %
Platelets: 179 10*3/uL (ref 150–400)
RBC: 4.06 MIL/uL — AB (ref 4.22–5.81)
RDW: 12.4 % (ref 11.5–15.5)
WBC: 6.2 10*3/uL (ref 4.0–10.5)

## 2018-12-25 LAB — BASIC METABOLIC PANEL
Anion gap: 10 (ref 5–15)
BUN: 31 mg/dL — ABNORMAL HIGH (ref 8–23)
CO2: 28 mmol/L (ref 22–32)
Calcium: 9.2 mg/dL (ref 8.9–10.3)
Chloride: 95 mmol/L — ABNORMAL LOW (ref 98–111)
Creatinine, Ser: 1.35 mg/dL — ABNORMAL HIGH (ref 0.61–1.24)
GFR calc non Af Amer: 48 mL/min — ABNORMAL LOW (ref >60)
GFR, EST AFRICAN AMERICAN: 55 mL/min — AB (ref >60)
Glucose, Bld: 389 mg/dL — ABNORMAL HIGH (ref 70–99)
Potassium: 4.8 mmol/L (ref 3.5–5.1)
Sodium: 133 mmol/L — ABNORMAL LOW (ref 135–145)

## 2018-12-25 LAB — POCT I-STAT EG7
Acid-Base Excess: 4 mmol/L — ABNORMAL HIGH (ref 0.0–2.0)
Bicarbonate: 30 mmol/L — ABNORMAL HIGH (ref 20.0–28.0)
Calcium, Ion: 1.15 mmol/L (ref 1.15–1.40)
HCT: 36 % — ABNORMAL LOW (ref 39.0–52.0)
Hemoglobin: 12.2 g/dL — ABNORMAL LOW (ref 13.0–17.0)
O2 Saturation: 86 %
PCO2 VEN: 49.8 mmHg (ref 44.0–60.0)
PO2 VEN: 54 mmHg — AB (ref 32.0–45.0)
Potassium: 4.9 mmol/L (ref 3.5–5.1)
Sodium: 134 mmol/L — ABNORMAL LOW (ref 135–145)
TCO2: 32 mmol/L (ref 22–32)
pH, Ven: 7.389 (ref 7.250–7.430)

## 2018-12-25 LAB — I-STAT CREATININE, ED: Creatinine, Ser: 1.3 mg/dL — ABNORMAL HIGH (ref 0.61–1.24)

## 2018-12-25 LAB — I-STAT TROPONIN, ED: Troponin i, poc: 0.06 ng/mL (ref 0.00–0.08)

## 2018-12-25 LAB — BRAIN NATRIURETIC PEPTIDE: B Natriuretic Peptide: 312.8 pg/mL — ABNORMAL HIGH (ref 0.0–100.0)

## 2018-12-25 NOTE — Telephone Encounter (Signed)
Received a page from patient's son that patient was "struggling to breathe". He checked his oxygen level with oximeter and noted SpO2 to be 90% on RA.   Patient is an 85 year old man who is s/p TAVR in 11/4823, with diastolic CHF, HTN, and CAD s/p CABG, pAF on eliquis and CHB s/p PPM.   Advised son to call 911 or bring patient to nearest Emergency Department for further evaluation.   Majestic Brister K. Marletta Lor, MD

## 2018-12-25 NOTE — ED Provider Notes (Signed)
[facility] EMERGENCY DEPARTMENT Provider Note   CSN: [id number] Arrival date & time: 12/25/18  2216    History   Chief Complaint Chief Complaint  Patient presents with  . Shortness of Breath    HPI Charles [name] is a 85 y.o. male.    85 y/o male who is 20 days s/p TAVR in 12/2018 (Dr. Cyndia Bent) with hx of DM, HTN, HLD, CAD s/p CABG x4V (2004), PAF on Eliquis, CHB s/p PPM, chronic diastolic CHF, COPD, DDD with reduced mobility presents to the ED for increased SOB.  Reports onset of SOB 1 week following TAVR.  States that he would sleep for a while at night, but wake up feeling short of breath and would need to sit on the side of his bed for a while until symptoms resolved. Patient's daughter apparently "called someone" who recommended that he start "sleeping on the opposite side". He tried this with improvement x 3-4 days, but has since developed progressive SOB with DOE and orthopnea. Son checked home O2 which was 90% on room air prompting call to EMS. The patient believes he may have gained 10 pounds since his hospital discharge. He has been compliant with all of his daily medications. Denies chest pain, syncope/near-syncope, abdominal pain, jaw or shoulder pain, back pain, extremity numbness or tingling, fevers. No hx of chronic O2 requirement.   Shortness of Breath    Past Medical History:  Diagnosis Date  . Abdominal aortic atherosclerosis ([place])    by xray  . Arthritis    in lower back (06/24/2015)  . Atrial flutter ([place])    notes 06/24/2015  . AV block, [place] 1 09/06/2017   Archie Endo 09/06/2017  . 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 CAD (coronary artery disease) 2004   a. s/p CABG in 2004 with LIMA-LAD, SVG-RCA, SVG-OM, and SVG-RI  . Carotid stenosis 1999   s/p L CEA  . Chronic diastolic CHF (congestive heart failure) ([place])   . Colon polyp 2005   (Dr. Tiffany Kocher)  . Compression fracture of L1 lumbar vertebra (H[facility]) remote  .  COPD (chronic obstructive pulmonary disease) ([place]) 03/2011   by xray  . Depression    hx  . GERD (gastroesophageal reflux disease) 2003   h/o duodenal ulcer per EGD as well as esophagitis  . History of colon polyps 2003, 2005   adenomatous Vira Agar)  . History of tobacco abuse   . HLD (hyperlipidemia)   . HTN (hypertension)   . Presence of permanent cardiac pacemaker   . Psoriasis   . Renal artery stenosis ([place]) 2004   70% bilateral, followed by cards  . 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 Severe aortic stenosis   . T2DM (type 2 diabetes mellitus) ([place]) 1995  . Urge incontinence of urine     Patient Active Problem List   Diagnosis Date Noted  . Dyspnea 12/26/2018  . S/P TAVR (transcatheter aortic valve replacement)   . 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 Paroxysmal atrial flutter ([place])   . Scrotal swelling 02/16/2018  . Coronary artery disease involving native coronary artery of native heart without angina pectoris   . Acute on chronic diastolic CHF (congestive heart failure) ([place]) 09/06/2017  . Renal insufficiency 07/01/2017  . Urinary urgency 07/01/2017  . Chronic anticoagulation 11/05/2016  . Chronic  radicular pain of lower back 02/17/2016  . Aortic stenosis, severe 06/04/2014  . DDD (degenerative disc disease), lumbar 10/02/2013  . Ex-smoker   . Carotid arterial disease ([place]) 08/23/2013  . MDD (major depressive disorder), recurrent episode, moderate ([place]) 09/21/2007  . OSTEOARTHRITIS 09/21/2007  . COLONIC POLYPS 01/31/2007  . Diabetes mellitus type 2, uncontrolled, with complications ([place]) 08/67/6195  . HYPERCHOLESTEROLEMIA 01/31/2007  . ERECTILE DYSFUNCTION 01/31/2007  . Essential hypertension 01/31/2007  . GERD 01/31/2007  . PSORIASIS 01/31/2007    Past Surgical History:  Procedure Laterality Date  . CARDIAC  CATHETERIZATION  03/06/2003   No intervention - recommend CABG  . CARDIOVASCULAR STRESS TEST  11/2010   normal perfusion, no evidence of ischemia, EF 62% post exercise  . CARDIOVASCULAR STRESS TEST  11/28/2012   Mild diaphragmatic attenuation; cannot exclude a focal region of nontransmural inferior scar  . CARDIOVERSION N/A 06/25/2015   Procedure: CARDIOVERSION;  Surgeon: Pixie Casino, MD;  Location: [facility] ENDOSCOPY;  Service: Cardiovascular;  Laterality: N/A;  . CARDIOVERSION N/A 11/02/2016   Procedure: CARDIOVERSION;  Surgeon: [name], MD;  Location: [facility];  Service: Cardiovascular;  Laterality: N/A;  . CAROTID ENDARTERECTOMY Left 1999   ([place])  . CATARACT EXTRACTION W/ INTRAOCULAR LENS  IMPLANT, BILATERAL Bilateral 01/2013   [place]  . COLONOSCOPY  2003   colon polyp x3 - adenomatous Tiffany Kocher)  . COLONOSCOPY  10/08/2012   2 TA, diverticulosis, int hem, no rpt rec Tiffany Kocher)  . CORONARY ANGIOPLASTY    . CORONARY ARTERY BYPASS GRAFT  03/07/2003   4v CABG ([place]) with LIMA to LAD, vein graft to RCA, 1st obtuse marginal, and ramus intermedius  . ESOPHAGOGASTRODUODENOSCOPY  10/08/2012   nl esophagus, duodenitis and erosive gastropathy, path - gastropathy no Hpylori, no rpt rec  . LUMBAR EPIDURAL INJECTION  03/2017   L5/S1 ([place])  . PACEMAKER IMPLANT N/A 04/21/2018   Procedure: PACEMAKER IMPLANT -- Dual Chamber;  Surgeon: Deboraha Sprang, MD;  Location: [facility] CV LAB;  Service: Cardiovascular;  Laterality: N/A;  . PERIPHERAL VASCULAR CATHETERIZATION N/A 02/02/2016   Procedure: Renal Angiography;  Surgeon: [name], MD;  Location: [facility] CV LAB;  Service: Cardiovascular;  Laterality: N/A;  . PERIPHERAL VASCULAR CATHETERIZATION N/A 02/02/2016   Procedure: Abdominal Aortogram;  Surgeon: [name], MD;  Location: [facility];  Service: Cardiovascular;  Laterality: N/A;  . RENAL DOPPLER  11/29/2011   Celiac&SMA-demonstrated vessel narrowing suggestive of a  greater than 50% diameter reduction. Bilateral renal arteries-demonstrated vessel narrowing of 60-99% diameter reduction. Rt Kidney-mid pole lateral simple cyst noted measuring 1.29x0.76x1.11cm and exophytic cyst outside lower pole measuring 1.23x0.96x1.31. Lft Kidney-lateral mid to lower pole simple cyst measuering-1.24x9.83x1.24  . RIGHT/LEFT HEART CATH AND CORONARY/GRAFT ANGIOGRAPHY N/A 10/20/2017   Procedure: RIGHT/LEFT HEART CATH AND CORONARY/GRAFT ANGIOGRAPHY;  Surgeon: Burnell Blanks, MD;  Location: [facility] LAB;  Service: Cardiovascular;  Laterality: N/A;  . TEE WITHOUT CARDIOVERSION N/A 06/25/2015   Procedure: TRANSESOPHAGEAL ECHOCARDIOGRAM (TEE);  Surgeon: Pixie Casino, MD;  Location: [facility] ENDOSCOPY;  Service: Cardiovascular;  Laterality: N/A;  . TEE WITHOUT CARDIOVERSION N/A 11/02/2016   Procedure: TRANSESOPHAGEAL ECHOCARDIOGRAM (TEE);  Surgeon: [name], MD;  Location: [facility];  Service: Cardiovascular;  Laterality: N/A;  . TEE WITHOUT CARDIOVERSION N/A 12/05/2018   Procedure: TRANSESOPHAGEAL ECHOCARDIOGRAM (TEE);  Surgeon: Sherren Mocha, MD;  Location: [facility];  Service: Open Heart Surgery;  Laterality: N/A;  . TONSILLECTOMY    . TRANSCATHETER AORTIC VALVE REPLACEMENT, TRANSFEMORAL  12/2018  [place]  . TRANSCATHETER AORTIC VALVE REPLACEMENT, TRANSFEMORAL N/A 12/05/2018   Procedure: TRANSCATHETER AORTIC VALVE REPLACEMENT, TRANSFEMORAL;  Surgeon: Sherren Mocha, MD;  Location: [facility];  Service: Open Heart Surgery;  Laterality: N/A;  . UPPER GASTROINTESTINAL ENDOSCOPY  2003   reflux esophagitis, erosive gastropathy, duodenal ulcer        Home Medications    Prior to Admission medications   Medication Sig Start Date End Date Taking? Authorizing Provider  acetaminophen (TYLENOL) 500 MG tablet Take 1 tablet (500 mg total) by mouth 3 (three) times daily. 02/17/16   [name], MD  aspirin 81 MG chewable tablet Chew 1 tablet (81 mg total) by mouth daily.  12/07/18   Eileen Stanford, PA-C  atorvastatin (LIPITOR) 20 MG tablet Take 1 tablet (20 mg total) by mouth daily. 08/01/18   [name], MD  citalopram (CELEXA) 20 MG tablet Take 1 tablet (20 mg total) by mouth daily. 08/01/18   [name], MD  ELIQUIS 5 MG TABS tablet Take 1 tablet (5 mg total) by mouth 2 (two) times daily. Patient taking differently: Take 5 mg by mouth 2 (two) times daily.  09/20/18   [name], MD  finasteride (PROSCAR) 5 MG tablet Take 1 tablet (5 mg total) by mouth daily. 12/12/18   [name], MD  glucose blood (ACCU-CHEK AVIVA PLUS) test strip 1 each by Other route as needed for other. Use as instructed to check blood sugar 3 (three) times a day.  Dx code:  E11.8, E11.65 07/10/18   [name], MD  insulin glargine (LANTUS) 100 UNIT/ML injection Inject 20 Units into the skin at bedtime.     [provider]  lisinopril (PRINIVIL,ZESTRIL) 40 MG tablet Take 1 tablet (40 mg total) by mouth daily. 12/14/18 12/09/19  Eileen Stanford, PA-C  metFORMIN (GLUCOPHAGE) 1000 MG tablet Take 1 tablet (1,000 mg total) by mouth 2 (two) times daily with a meal. 08/28/18   [name], MD  nitroGLYCERIN (NITROSTAT) 0.4 MG SL tablet Place 1 tablet (0.4 mg total) under the tongue every 5 (five) minutes as needed for chest pain. 03/08/18   Almyra Deforest, PA  pantoprazole (PROTONIX) 20 MG tablet Take 1 tablet (20 mg total) by mouth daily. Patient taking differently: Take 20 mg by mouth daily.  09/26/18   [name], MD  Potassium Chloride ER 20 MEQ TBCR Take 20 mEq by mouth every Monday, Wednesday, Friday, Saturday, and Sunday at 6 PM. 10/13/18   [name], MD  potassium chloride SA (K-DUR,KLOR-CON) 20 MEQ tablet Take 20 mEq by mouth. 12/18/18   [provider]  tamsulosin (FLOMAX) 0.4 MG CAPS capsule Take 1 capsule (0.4 mg total) by mouth daily after supper. 09/26/18   [name], MD  torsemide (DEMADEX) 20 MG tablet Take 1  tablet (20 mg total) by mouth 2 (two) times daily. 12/14/18 12/09/19  Eileen Stanford, PA-C  traZODone (DESYREL) 50 MG tablet Take 50 mg by mouth at bedtime.    [provider]    Family History Family History  Problem Relation Age of Onset  . Hypertension Father   . Heart disease Father   . Cancer Mother        GI cancer  . Diabetes Sister   . Heart disease Sister   . Stroke Sister   . Hypertension Sister   . Cancer Brother        Lung cancer - nonsmoker  . Cancer Maternal Grandmother   . Heart attack Maternal Grandfather   .  Heart attack Paternal Grandmother   . Heart attack Paternal Grandfather   . Cancer Brother        Pancreatic cancer  . Lung disease Brother     Social History Social History   Tobacco Use  . Smoking status: Former Smoker    Packs/day: 0.50    Years: 36.00    Pack years: 18.00    Types: Cigarettes    Last attempt to quit: 07/23/2013    Years since quitting: 5.4  . Smokeless tobacco: Never Used  Substance Use Topics  . Alcohol use: No  . Drug use: No     Allergies   Gabapentin; Metoprolol; Spironolactone; Amlodipine; Other; Rosiglitazone maleate; and Tricor [fenofibrate]   Review of Systems Review of Systems  Respiratory: Positive for shortness of breath.   Ten systems reviewed and are negative for acute change, except as noted in the HPI.    Physical Exam Updated Vital Signs BP (!) 143/106   Pulse 87   Temp 98 F (36.7 C) (Oral)   Resp (!) 21   SpO2 99%   Physical Exam Vitals signs and nursing note reviewed.  Constitutional:      General: He is not in acute distress.    Appearance: He is well-developed. He is not diaphoretic.     Comments: Obese male, in NAD  HENT:     Head: Normocephalic and atraumatic.  Eyes:     General: No scleral icterus.    Conjunctiva/sclera: Conjunctivae normal.  Neck:     Musculoskeletal: Normal range of motion.  Cardiovascular:     Rate and Rhythm: Normal rate and regular rhythm.      Pulses: Normal pulses.     Heart sounds: Murmur present.     Comments: Pacemaker to L upper chest. Sternotomy scar well healed. Pulmonary:     Effort: Pulmonary effort is normal. No respiratory distress.     Comments: Decreased breath sounds in bilateral bases. Otherwise clear. SpO2 98-100% on 2L via [place]. Abdominal:     Palpations: Abdomen is soft. There is no mass.     Tenderness: There is no abdominal tenderness. There is no guarding.     Comments: Soft, obese abdomen. Nontender.   Musculoskeletal: Normal range of motion.     Comments: Trace edema in BLE  Skin:    General: Skin is warm and dry.     Coloration: Skin is not pale.     Findings: No erythema or rash.  Neurological:     Mental Status: He is alert and oriented to person, place, and time.  Psychiatric:        Behavior: Behavior normal.      ED Treatments / Results  Labs (all labs ordered are listed, but only abnormal results are displayed) Labs Reviewed  CBC WITH DIFFERENTIAL/PLATELET - Abnormal; Notable for the following components:      Result Value   RBC 4.06 (*)    Hemoglobin 11.7 (*)    HCT 37.6 (*)    All other components within normal limits  BASIC METABOLIC PANEL - Abnormal; Notable for the following components:   Sodium 133 (*)    Chloride 95 (*)    Glucose, Bld 389 (*)    BUN 31 (*)    Creatinine, Ser 1.35 (*)    GFR calc non Af Amer 48 (*)    GFR calc Af Amer 55 (*)    All other components within normal limits  BRAIN NATRIURETIC PEPTIDE - Abnormal; Notable for the  following components:   B Natriuretic Peptide 312.8 (*)    All other components within normal limits  I-STAT CREATININE, ED - Abnormal; Notable for the following components:   Creatinine, Ser 1.30 (*)    All other components within normal limits  POCT I-STAT EG7 - Abnormal; Notable for the following components:   pO2, Ven 54.0 (*)    Bicarbonate 30.0 (*)    Acid-Base Excess 4.0 (*)    Sodium 134 (*)    HCT 36.0 (*)    Hemoglobin  12.2 (*)    All other components within normal limits  PROTIME-INR  I-STAT TROPONIN, ED    EKG EKG Interpretation  Date/Time:  Monday December 25 2018 22:24:05 EDT Ventricular Rate:  85 PR Interval:    QRS Duration: 185 QT Interval:  444 QTC Calculation: 528 R Axis:   -75 Text Interpretation:  Sinus rhythm Probable left atrial enlargement Left bundle branch block No acute changes No significant change since last tracing Confirmed by Varney Biles [phone]) on 12/25/2018 11:14:10 PM   Radiology Dg Chest 2 View  Result Date: 12/25/2018 CLINICAL DATA:  Increased shortness of breath. Recent transcatheter aortic valve replacement. EXAM: CHEST - 2 VIEW COMPARISON:  Radiograph 12/05/2018, 12/01/2018 FINDINGS: Post median sternotomy. Left-sided pacemaker in place. Post transcatheter aortic valve replacement. Cardiomegaly is unchanged. Chronic elevation of left hemidiaphragm with basilar atelectasis, unchanged. No pulmonary edema. No acute airspace disease or pneumothorax. No pleural effusion. Degenerative change in the spine. IMPRESSION: Chronic elevation of left hemidiaphragm with left basilar atelectasis. No acute abnormality or change from prior exam. Electronically Signed   By: Keith Rake M.D.   On: 12/25/2018 23:16    Procedures Procedures (including critical care time)  Echo 12/06/2018 IMPRESSIONS 1. The left ventricle has normal systolic function, with an ejection fraction of 55-60%. The cavity size was normal. There is moderately increased left ventricular wall thickness. Left ventricular diastolic Doppler parameters are indeterminate There is  abnormal septal motion consistent with RV pacemaker. 2. Left atrial size was moderately dilated. 3. The mitral valve is degenerative. There is moderate to severe mitral annular calcification present. Mild mitral valve stenosis. 4. A 26 an Edwards Edwards Sapien bioprosthetic aortic valve (TAVR) valve is present in the aortic position.  Procedure Date: 12/05/2018 Normal aortic valve prosthesis. 5. The inferior vena cava was dilated in size with <50% respiratory variability. 6. The interatrial septum was not well visualized. 7. When compared to the prior study: 09/05/2018: 50-55%, moderate to severe stenosis.   Medications Ordered in ED Medications  acetaminophen (TYLENOL) tablet 500 mg (has no administration in time range)  aspirin chewable tablet 81 mg (has no administration in time range)  atorvastatin (LIPITOR) tablet 20 mg (has no administration in time range)  citalopram (CELEXA) tablet 20 mg (has no administration in time range)  insulin glargine (LANTUS) injection 10 Units (has no administration in time range)  pantoprazole (PROTONIX) EC tablet 20 mg (has no administration in time range)  finasteride (PROSCAR) tablet 5 mg (has no administration in time range)  tamsulosin (FLOMAX) capsule 0.4 mg (has no administration in time range)  apixaban (ELIQUIS) tablet 5 mg (has no administration in time range)  sodium chloride flush (NS) 0.9 % injection 3 mL (has no administration in time range)  sodium chloride flush (NS) 0.9 % injection 3 mL (has no administration in time range)  0.9 %  sodium chloride infusion (has no administration in time range)  acetaminophen (TYLENOL) tablet 650 mg (has no administration in  time range)  ondansetron (ZOFRAN) injection 4 mg (has no administration in time range)     1:10 AM Cardiology to assess the patient in the ED regarding plans for admission.  1:20 AM Patient updated on plan. Cardiology presently at bedside.  1:33 AM Cardiology to admit.   Initial Impression / Assessment and Plan / ED Course  I have reviewed the triage vital signs and the nursing notes.  Pertinent labs & imaging results that were available during my care of the patient were reviewed by me and considered in my medical decision making (see chart for details).        85 year old male 20 days status post  TAVR presents to the emergency department for progressive shortness of breath.  Reports a 10 pound weight gain since hospital discharge, but does not appear overly fluid overloaded.  Has trace edema in lower extremities.  Decreased breath sounds in bilateral lung bases.  His labs are stable compared to baseline with slightly elevated BNP and troponin of 0.06.  Given recent cardiac procedure with extensive cardiac hx, plan for admission to Post with plan for likely echo in the AM.  PE considered, but felt less likely given that patient is chronically anticoagulated on Eliquis.   Final Clinical Impressions(s) / ED Diagnoses   Final diagnoses:  Shortness of breath  S/P aortic valve replacement    ED Discharge Orders    None       Antonietta Breach, PA-C 12/26/18 0201    Varney Biles, MD 12/26/18 [phone]

## 2018-12-25 NOTE — ED Triage Notes (Signed)
Pt here for increased SOB over the last day.  Hx of aortic surgery in last 2 weeks.  Noticeable pitting edema in bilateral lower extremities. A&Ox4. No cough or fever.

## 2018-12-26 ENCOUNTER — Observation Stay (HOSPITAL_BASED_OUTPATIENT_CLINIC_OR_DEPARTMENT_OTHER): Payer: Medicare Other

## 2018-12-26 DIAGNOSIS — I10 Essential (primary) hypertension: Secondary | ICD-10-CM | POA: Diagnosis not present

## 2018-12-26 DIAGNOSIS — I340 Nonrheumatic mitral (valve) insufficiency: Secondary | ICD-10-CM

## 2018-12-26 DIAGNOSIS — R0609 Other forms of dyspnea: Secondary | ICD-10-CM | POA: Diagnosis not present

## 2018-12-26 DIAGNOSIS — R0600 Dyspnea, unspecified: Secondary | ICD-10-CM

## 2018-12-26 DIAGNOSIS — Z952 Presence of prosthetic heart valve: Secondary | ICD-10-CM

## 2018-12-26 DIAGNOSIS — R0602 Shortness of breath: Secondary | ICD-10-CM | POA: Diagnosis present

## 2018-12-26 DIAGNOSIS — E861 Hypovolemia: Secondary | ICD-10-CM

## 2018-12-26 DIAGNOSIS — I9589 Other hypotension: Secondary | ICD-10-CM

## 2018-12-26 DIAGNOSIS — I351 Nonrheumatic aortic (valve) insufficiency: Secondary | ICD-10-CM

## 2018-12-26 LAB — PROTIME-INR
INR: 1.1 (ref 0.8–1.2)
Prothrombin Time: 14.3 seconds (ref 11.4–15.2)

## 2018-12-26 LAB — ECHOCARDIOGRAM COMPLETE
Height: 73 in
Weight: 3428.59 oz

## 2018-12-26 LAB — GLUCOSE, CAPILLARY
Glucose-Capillary: 293 mg/dL — ABNORMAL HIGH (ref 70–99)
Glucose-Capillary: 301 mg/dL — ABNORMAL HIGH (ref 70–99)
Glucose-Capillary: 260 mg/dL — ABNORMAL HIGH (ref 70–99)
Glucose-Capillary: 211 mg/dL — ABNORMAL HIGH (ref 70–99)
Glucose-Capillary: 279 mg/dL — ABNORMAL HIGH (ref 70–99)

## 2018-12-26 LAB — BASIC METABOLIC PANEL
Anion gap: 6 (ref 5–15)
BUN: 30 mg/dL — ABNORMAL HIGH (ref 8–23)
CO2: 29 mmol/L (ref 22–32)
Calcium: 9 mg/dL (ref 8.9–10.3)
Chloride: 101 mmol/L (ref 98–111)
Creatinine, Ser: 1.43 mg/dL — ABNORMAL HIGH (ref 0.61–1.24)
GFR calc Af Amer: 52 mL/min — ABNORMAL LOW (ref >60)
GFR calc non Af Amer: 45 mL/min — ABNORMAL LOW (ref >60)
Glucose, Bld: 243 mg/dL — ABNORMAL HIGH (ref 70–99)
Potassium: 4.3 mmol/L (ref 3.5–5.1)
Sodium: 136 mmol/L (ref 135–145)

## 2018-12-26 LAB — CBG MONITORING, ED: Glucose-Capillary: 301 mg/dL — ABNORMAL HIGH (ref 70–99)

## 2018-12-26 MED ORDER — CITALOPRAM HYDROBROMIDE 20 MG PO TABS
20.0000 mg | ORAL_TABLET | Freq: Every day | ORAL | Status: DC
  Administered 2018-12-26 – 2018-12-27 (×2): 20 mg via ORAL
  Filled 2018-12-26 (×2): qty 1

## 2018-12-26 MED ORDER — ATORVASTATIN CALCIUM 10 MG PO TABS
20.0000 mg | ORAL_TABLET | Freq: Every day | ORAL | Status: DC
  Administered 2018-12-26 – 2018-12-27 (×2): 20 mg via ORAL
  Filled 2018-12-26 (×2): qty 2

## 2018-12-26 MED ORDER — SODIUM CHLORIDE 0.9 % IV SOLN: INTRAVENOUS | Status: DC

## 2018-12-26 MED ORDER — SODIUM CHLORIDE 0.9 % IV SOLN: Freq: Once | INTRAVENOUS | Status: AC

## 2018-12-26 MED ORDER — INSULIN GLARGINE 100 UNIT/ML ~~LOC~~ SOLN
10.0000 [IU] | Freq: Every day | SUBCUTANEOUS | Status: DC
  Administered 2018-12-26 (×2): 10 [IU] via SUBCUTANEOUS
  Filled 2018-12-26 (×3): qty 0.1

## 2018-12-26 MED ORDER — ONDANSETRON HCL 4 MG/2ML IJ SOLN: 4.0000 mg | Freq: Four times a day (QID) | INTRAMUSCULAR | Status: DC | PRN

## 2018-12-26 MED ORDER — ACETAMINOPHEN 325 MG PO TABS: 650.0000 mg | ORAL_TABLET | ORAL | Status: DC | PRN

## 2018-12-26 MED ORDER — FINASTERIDE 5 MG PO TABS
5.0000 mg | ORAL_TABLET | Freq: Every day | ORAL | Status: DC
  Administered 2018-12-26 – 2018-12-27 (×2): 5 mg via ORAL
  Filled 2018-12-26 (×2): qty 1

## 2018-12-26 MED ORDER — SODIUM CHLORIDE 0.9 % IV BOLUS
300.0000 mL | Freq: Once | INTRAVENOUS | Status: AC
  Administered 2018-12-26: 300 mL via INTRAVENOUS

## 2018-12-26 MED ORDER — SODIUM CHLORIDE 0.9% FLUSH
3.0000 mL | Freq: Two times a day (BID) | INTRAVENOUS | Status: DC
  Administered 2018-12-26 (×3): 3 mL via INTRAVENOUS

## 2018-12-26 MED ORDER — APIXABAN 5 MG PO TABS
5.0000 mg | ORAL_TABLET | Freq: Two times a day (BID) | ORAL | Status: DC
  Administered 2018-12-26 – 2018-12-27 (×3): 5 mg via ORAL
  Filled 2018-12-26 (×3): qty 1

## 2018-12-26 MED ORDER — ACETAMINOPHEN 500 MG PO TABS: 500.0000 mg | ORAL_TABLET | Freq: Three times a day (TID) | ORAL | Status: DC

## 2018-12-26 MED ORDER — ASPIRIN 81 MG PO CHEW
81.0000 mg | CHEWABLE_TABLET | Freq: Every day | ORAL | Status: DC
  Administered 2018-12-26 – 2018-12-27 (×2): 81 mg via ORAL
  Filled 2018-12-26 (×2): qty 1

## 2018-12-26 MED ORDER — SODIUM CHLORIDE 0.9 % IV SOLN: 250.0000 mL | INTRAVENOUS | Status: DC | PRN

## 2018-12-26 MED ORDER — SODIUM CHLORIDE 0.9% FLUSH: 3.0000 mL | INTRAVENOUS | Status: DC | PRN

## 2018-12-26 MED ORDER — TAMSULOSIN HCL 0.4 MG PO CAPS
0.4000 mg | ORAL_CAPSULE | Freq: Every day | ORAL | Status: DC
  Administered 2018-12-26: 0.4 mg via ORAL
  Filled 2018-12-26: qty 1

## 2018-12-26 MED ORDER — SODIUM CHLORIDE 0.9 % IV SOLN
INTRAVENOUS | Status: DC
  Administered 2018-12-26 (×2): via INTRAVENOUS

## 2018-12-26 MED ORDER — INSULIN ASPART 100 UNIT/ML ~~LOC~~ SOLN
0.0000 [IU] | Freq: Three times a day (TID) | SUBCUTANEOUS | Status: DC
  Administered 2018-12-26: 5 [IU] via SUBCUTANEOUS
  Administered 2018-12-26: 7 [IU] via SUBCUTANEOUS
  Administered 2018-12-26 – 2018-12-27 (×2): 3 [IU] via SUBCUTANEOUS
  Administered 2018-12-27: 2 [IU] via SUBCUTANEOUS

## 2018-12-26 MED ORDER — PANTOPRAZOLE SODIUM 20 MG PO TBEC
20.0000 mg | DELAYED_RELEASE_TABLET | Freq: Every day | ORAL | Status: DC
  Administered 2018-12-26 – 2018-12-27 (×2): 20 mg via ORAL
  Filled 2018-12-26 (×2): qty 1

## 2018-12-26 MED ORDER — IPRATROPIUM-ALBUTEROL 0.5-2.5 (3) MG/3ML IN SOLN
3.0000 mL | Freq: Once | RESPIRATORY_TRACT | Status: AC
  Administered 2018-12-26: 3 mL via RESPIRATORY_TRACT

## 2018-12-26 NOTE — H&P (Signed)
Cardiology Admission History and Physical:   Patient ID: ARLEY SALAMONE MRN: [id number]; DOB: Sep 17, 1932   Admission date: 12/25/2018  Primary Care Provider: [name], MD Primary Cardiologist: [name], MD  Primary Electrophysiologist:  None   Chief Complaint:  Shortness of breath.   History of Present Illness:   DAMARIEN NYMAN is a 85 y.o. male with DMT2, HTN, HLD,DMT2,CAD s/p CABG x4V (2004), PAF on Eliquis, CHB s/p PPM, chronic diastolic CHF, COPD, DDD with reduced mobility, and severe AS s/p TAVR (12/05/18) who presents with worsening SOB.   Mr. Langdon underwent TAVR on 12/05/18. After discharge, patient noted to have worsening LE edema and shortness of breath for which his torsemide was increased from 40mg  daily to 40mg  twice a day. He states that this dramatically improved his LE edema and dyspnea. However, over the past week he has again noted worsening orthopnea and dyspnea on exertion. He has been taking 40mg  torsemide in the morning and 20 mg in the evening with no amelioration of his symptoms. This evening, his son noted that he was more labored in his breathing and a home oximeter showed an SpO2 of 90%.   In the ER, CXR was without significant pulmonary edema and without obvious infiltrate. Labs were notable for a Cr of 1.35 (ip from 1.16) with a BNP of 312 (down from ~ 500). No leukocytosis of lymphopenia. Initial troponin 0.06.   Cardiology was consulted for admission.    Past Medical History:  Diagnosis Date  . Abdominal aortic atherosclerosis ([place])    by xray  . Arthritis    in lower back (06/24/2015)  . Atrial flutter ([place])    notes 06/24/2015  . AV block, [place] 1 09/06/2017   Archie Endo 09/06/2017  . 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 CAD (coronary artery disease) 2004   a. s/p CABG in 2004 with LIMA-LAD, SVG-RCA, SVG-OM, and SVG-RI  . Carotid stenosis 1999   s/p L CEA  . Chronic diastolic CHF (congestive heart  failure) ([place])   . Colon polyp 2005   (Dr. Tiffany Kocher)  . Compression fracture of L1 lumbar vertebra (H[facility]) remote  . COPD (chronic obstructive pulmonary disease) ([place]) 03/2011   by xray  . Depression    hx  . GERD (gastroesophageal reflux disease) 2003   h/o duodenal ulcer per EGD as well as esophagitis  . History of colon polyps 2003, 2005   adenomatous Vira Agar)  . History of tobacco abuse   . HLD (hyperlipidemia)   . HTN (hypertension)   . Presence of permanent cardiac pacemaker   . Psoriasis   . Renal artery stenosis ([place]) 2004   70% bilateral, followed by cards  . 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 Severe aortic stenosis   . T2DM (type 2 diabetes mellitus) ([place]) 1995  . Urge incontinence of urine     Past Surgical History:  Procedure Laterality Date  . CARDIAC CATHETERIZATION  03/06/2003   No intervention - recommend CABG  . CARDIOVASCULAR STRESS TEST  11/2010   normal perfusion, no evidence of ischemia, EF 62% post exercise  . CARDIOVASCULAR STRESS TEST  11/28/2012   Mild diaphragmatic attenuation; cannot exclude a focal region of nontransmural inferior scar  . CARDIOVERSION N/A 06/25/2015   Procedure: CARDIOVERSION;  Surgeon: Pixie Casino, MD;  Location: [facility];  Service: Cardiovascular;  Laterality: N/A;  . CARDIOVERSION N/A 11/02/2016   Procedure: CARDIOVERSION;  Surgeon: [name], MD;  Location: [facility];  Service: Cardiovascular;  Laterality: N/A;  . CAROTID ENDARTERECTOMY Left 1999   ([place])  . CATARACT EXTRACTION W/ INTRAOCULAR LENS  IMPLANT, BILATERAL Bilateral 01/2013   [place]  . COLONOSCOPY  2003   colon polyp x3 - adenomatous Tiffany Kocher)  . COLONOSCOPY  10/08/2012   2 TA, diverticulosis, int hem, no rpt rec Tiffany Kocher)  . CORONARY ANGIOPLASTY    . CORONARY ARTERY BYPASS GRAFT  03/07/2003   4v CABG ([place]) with LIMA to LAD, vein  graft to RCA, 1st obtuse marginal, and ramus intermedius  . ESOPHAGOGASTRODUODENOSCOPY  10/08/2012   nl esophagus, duodenitis and erosive gastropathy, path - gastropathy no Hpylori, no rpt rec  . LUMBAR EPIDURAL INJECTION  03/2017   L5/S1 ([place])  . PACEMAKER IMPLANT N/A 04/21/2018   Procedure: PACEMAKER IMPLANT -- Dual Chamber;  Surgeon: Deboraha Sprang, MD;  Location: [facility];  Service: Cardiovascular;  Laterality: N/A;  . PERIPHERAL VASCULAR CATHETERIZATION N/A 02/02/2016   Procedure: Renal Angiography;  Surgeon: [name], MD;  Location: [facility];  Service: Cardiovascular;  Laterality: N/A;  . PERIPHERAL VASCULAR CATHETERIZATION N/A 02/02/2016   Procedure: Abdominal Aortogram;  Surgeon: [name], MD;  Location: [facility];  Service: Cardiovascular;  Laterality: N/A;  . RENAL DOPPLER  11/29/2011   Celiac&SMA-demonstrated vessel narrowing suggestive of a greater than 50% diameter reduction. Bilateral renal arteries-demonstrated vessel narrowing of 60-99% diameter reduction. Rt Kidney-mid pole lateral simple cyst noted measuring 1.29x0.76x1.11cm and exophytic cyst outside lower pole measuring 1.23x0.96x1.31. Lft Kidney-lateral mid to lower pole simple cyst measuering-1.24x9.83x1.24  . RIGHT/LEFT HEART CATH AND CORONARY/GRAFT ANGIOGRAPHY N/A 10/20/2017   Procedure: RIGHT/LEFT HEART CATH AND CORONARY/GRAFT ANGIOGRAPHY;  Surgeon: Burnell Blanks, MD;  Location: [facility];  Service: Cardiovascular;  Laterality: N/A;  . TEE WITHOUT CARDIOVERSION N/A 06/25/2015   Procedure: TRANSESOPHAGEAL ECHOCARDIOGRAM (TEE);  Surgeon: Pixie Casino, MD;  Location: [facility];  Service: Cardiovascular;  Laterality: N/A;  . TEE WITHOUT CARDIOVERSION N/A 11/02/2016   Procedure: TRANSESOPHAGEAL ECHOCARDIOGRAM (TEE);  Surgeon: [name], MD;  Location: [facility];  Service: Cardiovascular;  Laterality: N/A;  . TEE WITHOUT CARDIOVERSION N/A 12/05/2018    Procedure: TRANSESOPHAGEAL ECHOCARDIOGRAM (TEE);  Surgeon: Sherren Mocha, MD;  Location: [facility];  Service: Open Heart Surgery;  Laterality: N/A;  . TONSILLECTOMY    . TRANSCATHETER AORTIC VALVE REPLACEMENT, TRANSFEMORAL  12/2018   [place]  . TRANSCATHETER AORTIC VALVE REPLACEMENT, TRANSFEMORAL N/A 12/05/2018   Procedure: TRANSCATHETER AORTIC VALVE REPLACEMENT, TRANSFEMORAL;  Surgeon: Sherren Mocha, MD;  Location: [facility];  Service: Open Heart Surgery;  Laterality: N/A;  . UPPER GASTROINTESTINAL ENDOSCOPY  2003   reflux esophagitis, erosive gastropathy, duodenal ulcer     Medications Prior to Admission: Prior to Admission medications   Medication Sig Start Date End Date Taking? Authorizing Provider  acetaminophen (TYLENOL) 500 MG tablet Take 1 tablet (500 mg total) by mouth 3 (three) times daily. 02/17/16   [name], MD  aspirin 81 MG chewable tablet Chew 1 tablet (81 mg total) by mouth daily. 12/07/18   Eileen Stanford, PA-C  atorvastatin (LIPITOR) 20 MG tablet Take 1 tablet (20 mg total) by mouth daily. 08/01/18   [name], MD  citalopram (CELEXA) 20 MG tablet Take 1 tablet (20 mg total) by mouth daily. 08/01/18   [name], MD  ELIQUIS 5 MG TABS tablet Take  1 tablet (5 mg total) by mouth 2 (two) times daily. Patient taking differently: Take 5 mg by mouth 2 (two) times daily.  09/20/18   [name], MD  finasteride (PROSCAR) 5 MG tablet Take 1 tablet (5 mg total) by mouth daily. 12/12/18   [name], MD  glucose blood (ACCU-CHEK AVIVA PLUS) test strip 1 each by Other route as needed for other. Use as instructed to check blood sugar 3 (three) times a day.  Dx code:  E11.8, E11.65 07/10/18   [name], MD  insulin glargine (LANTUS) 100 UNIT/ML injection Inject 20 Units into the skin at bedtime.     [provider]  lisinopril (PRINIVIL,ZESTRIL) 40 MG tablet Take 1 tablet (40 mg total) by mouth daily. 12/14/18 12/09/19  Eileen Stanford, PA-C   metFORMIN (GLUCOPHAGE) 1000 MG tablet Take 1 tablet (1,000 mg total) by mouth 2 (two) times daily with a meal. 08/28/18   [name], MD  nitroGLYCERIN (NITROSTAT) 0.4 MG SL tablet Place 1 tablet (0.4 mg total) under the tongue every 5 (five) minutes as needed for chest pain. 03/08/18   Almyra Deforest, PA  pantoprazole (PROTONIX) 20 MG tablet Take 1 tablet (20 mg total) by mouth daily. Patient taking differently: Take 20 mg by mouth daily.  09/26/18   [name], MD  Potassium Chloride ER 20 MEQ TBCR Take 20 mEq by mouth every Monday, Wednesday, Friday, Saturday, and Sunday at 6 PM. 10/13/18   [name], MD  potassium chloride SA (K-DUR,KLOR-CON) 20 MEQ tablet Take 20 mEq by mouth. 12/18/18   [provider]  tamsulosin (FLOMAX) 0.4 MG CAPS capsule Take 1 capsule (0.4 mg total) by mouth daily after supper. 09/26/18   [name], MD  torsemide (DEMADEX) 20 MG tablet Take 1 tablet (20 mg total) by mouth 2 (two) times daily. 12/14/18 12/09/19  Eileen Stanford, PA-C  traZODone (DESYREL) 50 MG tablet Take 50 mg by mouth at bedtime.    [provider]     Allergies:    Allergies  Allergen Reactions  . Gabapentin Other (See Comments)    Leg pain, weakness   . Metoprolol Swelling  . Spironolactone Other (See Comments)    Painful gynecomastia  . Amlodipine Other (See Comments)    Edema  . Other Other (See Comments)    Horse serum - myalgias  . Rosiglitazone Maleate Other (See Comments)    REACTION: Did not help  . Tricor [Fenofibrate] Other (See Comments)    myalgias    Social History:   Social History   Socioeconomic History  . Marital status: Widowed    Spouse name: Not on file  . Number of children: Not on file  . Years of education: Not on file  . Highest education level: Not on file  Occupational History  . Not on file  Social Needs  . Financial resource strain: Not on file  . Food insecurity:    Worry: Not on file    Inability: Not on  file  . Transportation needs:    Medical: Not on file    Non-medical: Not on file  Tobacco Use  . Smoking status: Former Smoker    Packs/day: 0.50    Years: 36.00    Pack years: 18.00    Types: Cigarettes    Last attempt to quit: 07/23/2013    Years since quitting: 5.4  . Smokeless tobacco: Never Used  Substance and Sexual Activity  . Alcohol use: No  . Drug  use: No  . Sexual activity: Not Currently  Lifestyle  . Physical activity:    Days per week: Not on file    Minutes per session: Not on file  . Stress: Not on file  Relationships  . Social connections:    Talks on phone: Not on file    Gets together: Not on file    Attends religious service: Not on file    Active member of club or organization: Not on file    Attends meetings of clubs or organizations: Not on file    Relationship status: Not on file  . Intimate partner violence:    Fear of current or ex partner: Not on file    Emotionally abused: Not on file    Physically abused: Not on file    Forced sexual activity: Not on file  Other Topics Concern  . Not on file  Social History Narrative   Caffeine: 4 cups coffee/day, some tea and soda   Lives with wife   Occupation: Retired   Activity: no regular exercise   Diet: good water, fruits/vegetables daily    Family History:   The patient's family history includes Cancer in his brother, brother, maternal grandmother, and mother; Diabetes in his sister; Heart attack in his maternal grandfather, paternal grandfather, and paternal grandmother; Heart disease in his father and sister; Hypertension in his father and sister; Lung disease in his brother; Stroke in his sister.    Review of Systems: [y] = yes, [ ]  = no     General: Weight gain [ ] ; Weight loss [ ] ; Anorexia [ ] ; Fatigue [ ] ; Fever [ ] ; Chills [ ] ; Weakness [ ]    Cardiac: Chest pain/pressure [ ] ; Resting SOB Blue.Reese ]; Exertional SOB [ y]; Orthopnea Blue.Reese ]; Pedal Edema [ ] ; Palpitations [ ] ; Syncope [ ] ; Presyncope  [ ] ; Paroxysmal nocturnal dyspnea[ ]    Pulmonary: Cough [ ] ; Wheezing[ ] ; Hemoptysis[ ] ; Sputum [ ] ; Snoring [ ]    GI: Vomiting[ ] ; Dysphagia[ ] ; Melena[ ] ; Hematochezia [ ] ; Heartburn[ ] ; Abdominal pain [ ] ; Constipation [ ] ; Diarrhea [ ] ; BRBPR [ ]    GU: Hematuria[ ] ; Dysuria [ ] ; Nocturia[ ]    Vascular: Pain in legs with walking [ ] ; Pain in feet with lying flat [ ] ; Non-healing sores [ ] ; Stroke [ ] ; TIA [ ] ; Slurred speech [ ] ;   Neuro: Headaches[ ] ; Vertigo[ ] ; Seizures[ ] ; Paresthesias[ ] ;Blurred vision [ ] ; Diplopia [ ] ; Vision changes [ ]    Ortho/Skin: Arthritis [ ] ; Joint pain [ ] ; Muscle pain [ ] ; Joint swelling [ ] ; Back Pain [ ] ; Rash [ ]    Psych: Depression[ ] ; Anxiety[ ]    Heme: Bleeding problems [ ] ; Clotting disorders [ ] ; Anemia [ ]    Endocrine: Diabetes [ ] ; Thyroid dysfunction[ ]   Physical Exam/Data:   Vitals:   12/25/18 2300 12/25/18 2330 12/26/18 0000 12/26/18 0030  BP: (!) 145/78 (!) 150/80 (!) 143/81 (!) 143/106  Pulse: 89 89 87 87  Resp: 18 (!) 23 (!) 25 (!) 21  Temp:      TempSrc:      SpO2: 98% 99% 97% 99%   No intake or output data in the 24 hours ending 12/26/18 0139 There were no vitals filed for this visit. There is no height or weight on file to calculate BMI.  General:  Well nourished, well developed, in no acute distress HEENT: normal Lymph: no adenopathy Neck: no JVD when lying flat.  Endocrine:  No thryomegaly Vascular: No carotid bruits; FA pulses 2+ bilaterally without bruits.  Cardiac:  normal S1, S2; RRR; no murmur. II/IV holosystolic murmur at the apex.  Lungs:  clear to auscultation bilaterally, no wheezing, rhonchi or rales.  Abd: soft, nontender, no hepatomegaly  Ext: no edema Musculoskeletal:  No deformities, BUE and BLE strength normal and equal Skin: warm and dry  Neuro:  CNs 2-12 intact, no focal abnormalities noted Psych:  Normal affect   EKG:  The ECG that was done demonstrates a ventricularly paced rhythm with LBBB  pattern. Wide QRS.  Relevant CV Studies: TTE 12/2018: 1. The left ventricle has normal systolic function, with an ejection fraction of 55-60%. The cavity size was normal. There is moderately increased left ventricular wall thickness. Left ventricular diastolic Doppler parameters are indeterminate There is  abnormal septal motion consistent with RV pacemaker.  2. Left atrial size was moderately dilated.  3. The mitral valve is degenerative. There is moderate to severe mitral annular calcification present. Mild mitral valve stenosis.  4. A 26 an Edwards Edwards Sapien bioprosthetic aortic valve (TAVR) valve is present in the aortic position. Procedure Date: 12/05/2018 Normal aortic valve prosthesis.  5. The inferior vena cava was dilated in size with <50% respiratory variability.  6. The interatrial septum was not well visualized.  PFTS: DLCO 48% predicted.  Consistnet with moderate obstructive disease.   Laboratory Data:  Chemistry Recent Labs  Lab 12/25/18 2246 12/25/18 2252  NA 133* 134*  K 4.8 4.9  CL 95*  --   CO2 28  --   GLUCOSE 389*  --   BUN 31*  --   CREATININE 1.35* 1.30*  CALCIUM 9.2  --   GFRNONAA 48*  --   GFRAA 55*  --   ANIONGAP 10  --     No results for input(s): PROT, ALBUMIN, AST, ALT, ALKPHOS, BILITOT in the last 168 hours. Hematology Recent Labs  Lab 12/25/18 2246 12/25/18 2252  WBC 6.2  --   RBC 4.06*  --   HGB 11.7* 12.2*  HCT 37.6* 36.0*  MCV 92.6  --   MCH 28.8  --   MCHC 31.1  --   RDW 12.4  --   PLT 179  --    Cardiac EnzymesNo results for input(s): TROPONINI in the last 168 hours.  Recent Labs  Lab 12/25/18 2324  TROPIPOC 0.06    BNP Recent Labs  Lab 12/25/18 2246  BNP 312.8*    DDimer No results for input(s): DDIMER in the last 168 hours.  Radiology/Studies:  Dg Chest 2 View  Result Date: 12/25/2018 CLINICAL DATA:  Increased shortness of breath. Recent transcatheter aortic valve replacement. EXAM: CHEST - 2 VIEW COMPARISON:   Radiograph 12/05/2018, 12/01/2018 FINDINGS: Post median sternotomy. Left-sided pacemaker in place. Post transcatheter aortic valve replacement. Cardiomegaly is unchanged. Chronic elevation of left hemidiaphragm with basilar atelectasis, unchanged. No pulmonary edema. No acute airspace disease or pneumothorax. No pleural effusion. Degenerative change in the spine. IMPRESSION: Chronic elevation of left hemidiaphragm with left basilar atelectasis. No acute abnormality or change from prior exam. Electronically Signed   By: Keith Rake M.D.   On: 12/25/2018 23:16    Assessment and Plan:   Mr. Plascencia is an 85 year old man with CAD s/p 4V CABG, COPD, pAF on eliquis, CHB s/p PPM and severe AS s/p TAVR 12/2018 who presents with ongoing SOB and DOE. Etiology is unclear. While in the periprocedural period it seem that he  was quite volume overloaded, today his lungs are clear with no LE edema and flat neck veins. If anything he looks dry on exam and by labs. 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 Another possibility would be related to COPD, but not cough, phleghm, other features to suggest acute exacerbation. It is worth noting that his DLCO on hsi prior PFTs was 48% predicted, which raises the question of lung disease out of proportion to radiographic features. Will admit for observation stay to better interrogate his TAVR valve and work up his ongoing dyspnea.   #Acute of Chronic Dyspnea #HFpEF #COPD -- Can repeat TTE to ensure no new PVL or thrombus of leaflets.  -- Would consider better interrogating mitral valve on TTE; if suboptimal windows consider RHC/LHC to interrogate valve v. TEE if clinical suspicion for MV disease progression high.  -- Will trial duoneb for COPD -- Supplemental oxygen for SpO2 > 92%.  -- Hold torsemide for now given labs, exam.  -- Consider repeat PFTs (DLCO  previously on 48% predicted)  #HTN -- Hold home lisinopril given mild AKI  #DM -- Will dose half home lantus (10U qHS) and uptitrate as needed -- Add ISS qAC as needed.   #pAF -- Continue home eliquis  Severity of Illness: The appropriate patient status for this patient is OBSERVATION. Observation status is judged to be reasonable and necessary in order to provide the required intensity of service to ensure the patient's safety. The patient's presenting symptoms, physical exam findings, and initial radiographic and laboratory data in the context of their medical condition is felt to place them at decreased risk for further clinical deterioration. Furthermore, it is anticipated that the patient will be medically stable for discharge from the hospital within 2 midnights of admission. The following factors support the patient status of observation.   " The patient's presenting symptoms include shortness of breath. " The physical exam findings include n/a " The initial radiographic and laboratory data with AKI.   For questions or updates, please contact [facility] Please consult [email or web address] for contact info under    Signed, Milus Banister, MD  12/26/2018 1:39 AM

## 2018-12-26 NOTE — Progress Notes (Addendum)
The patient has been seen in conjunction with Fabian Sharp, [place]. All aspects of care have been considered and discussed. The patient has been personally interviewed, examined, and all clinical data has been reviewed.   Admitted last evening with shortness of breath.  For several weeks prior had noticed lower extremity swelling.  Outpatient diuretic regimen was intensified.  On admission BNP was 300, chest x-ray revealed no evidence of failure, and echo today demonstrates preserved low normal LVEF, mild perivalvular leak around the TAVR prosthesis.  This afternoon he has developed hypotension with blood pressure 85 mmHg systolic.  He is able to lie flat.  Mentation is normal.  No obvious aortic regurgitation is heard on exam.  There is no peripheral edema.  He does have faint cellophane-like crackles throughout both lung bases.  Hypotension is related to volume contraction.  We will give a fluid bolus and start IV saline.  Hold diuretic therapy for the time being.  Echocardiographically, there is a mild aortic perivalvular leak and residual degenerative mitral valve disease.  Reviewed the most recent echo study from today and do not see anything that is particularly alarming.  Will review with colleagues/structural heart team.  Perhaps his shortness of breath has been pulmonary etiology rather than CHF.  Progress Note  Patient Name: Charles [name] Date of Encounter: 12/26/2018  Primary Cardiologist: [name], MD Dr. Burt Knack (TAVR)/ [place]  Subjective   Pt states he is breathing better today, was able to sleep flat last night. Still with cough on exam  Inpatient Medications    Scheduled Meds: . apixaban  5 mg Oral BID  . aspirin  81 mg Oral Daily  . atorvastatin  20 mg Oral Daily  . citalopram  20 mg Oral Daily  . finasteride  5 mg Oral Daily  . insulin aspart  0-9 Units Subcutaneous TID WC  . insulin glargine  10 Units Subcutaneous QHS  . pantoprazole  20 mg Oral Daily  . sodium  chloride flush  3 mL Intravenous Q12H  . tamsulosin  0.4 mg Oral QPC supper   Continuous Infusions: . sodium chloride     PRN Meds: sodium chloride, acetaminophen, ondansetron (ZOFRAN) IV, sodium chloride flush   Vital Signs    Vitals:   12/26/18 0145 12/26/18 0200 12/26/18 0215 12/26/18 0315  BP:  135/86  101/75  Pulse: 84 85 100   Resp: (!) 26 (!) 22 (!) 29   Temp:    98 F (36.7 C)  TempSrc:    Oral  SpO2: 99% 97% 96%   Weight:    97.2 kg  Height:    6' 1"  (1.854 m)    Intake/Output Summary (Last 24 hours) at 12/26/2018 0818 Last data filed at 12/26/2018 0641 Gross per 24 hour  Intake -  Output 200 ml  Net -200 ml   Last 3 Weights 12/26/2018 12/14/2018 12/07/2018  Weight (lbs) 214 lb 4.6 oz 226 lb 6.4 oz 222 lb 11.2 oz  Weight (kg) 97.2 kg 102.694 kg 101.016 kg      Telemetry    V-paced rhythm - Personally Reviewed  ECG    No new tracings - Personally Reviewed  Physical Exam   GEN: No acute distress.   Neck: No JVD Cardiac: RRR, no murmurs, rubs, or gallops.  Respiratory: respirations unlabored, with supplemental O2, cough GI: Soft, nontender, non-distended  MS: No edema; No deformity. Neuro:  Nonfocal  Psych: Normal affect   Labs    Chemistry Recent Labs  Lab 12/25/18 2246 12/25/18 2252  NA 133* 134*  K 4.8 4.9  CL 95*  --   CO2 28  --   GLUCOSE 389*  --   BUN 31*  --   CREATININE 1.35* 1.30*  CALCIUM 9.2  --   GFRNONAA 48*  --   GFRAA 55*  --   ANIONGAP 10  --      Hematology Recent Labs  Lab 12/25/18 2246 12/25/18 2252  WBC 6.2  --   RBC 4.06*  --   HGB 11.7* 12.2*  HCT 37.6* 36.0*  MCV 92.6  --   MCH 28.8  --   MCHC 31.1  --   RDW 12.4  --   PLT 179  --     Cardiac EnzymesNo results for input(s): TROPONINI in the last 168 hours.  Recent Labs  Lab 12/25/18 2324  TROPIPOC 0.06     BNP Recent Labs  Lab 12/25/18 2246  BNP 312.8*     DDimer No results for input(s): DDIMER in the last 168 hours.   Radiology    Dg  Chest 2 View  Result Date: 12/25/2018 CLINICAL DATA:  Increased shortness of breath. Recent transcatheter aortic valve replacement. EXAM: CHEST - 2 VIEW COMPARISON:  Radiograph 12/05/2018, 12/01/2018 FINDINGS: Post median sternotomy. Left-sided pacemaker in place. Post transcatheter aortic valve replacement. Cardiomegaly is unchanged. Chronic elevation of left hemidiaphragm with basilar atelectasis, unchanged. No pulmonary edema. No acute airspace disease or pneumothorax. No pleural effusion. Degenerative change in the spine. IMPRESSION: Chronic elevation of left hemidiaphragm with left basilar atelectasis. No acute abnormality or change from prior exam. Electronically Signed   By: Keith Rake M.D.   On: 12/25/2018 23:16    Cardiac Studies   Echo pending  Echo 12/06/18: 1. The left ventricle has normal systolic function, with an ejection fraction of 55-60%. The cavity size was normal. There is moderately increased left ventricular wall thickness. Left ventricular diastolic Doppler parameters are indeterminate There is  abnormal septal motion consistent with RV pacemaker.  2. Left atrial size was moderately dilated.  3. The mitral valve is degenerative. There is moderate to severe mitral annular calcification present. Mild mitral valve stenosis.  4. A 26 an Edwards Edwards Sapien bioprosthetic aortic valve (TAVR) valve is present in the aortic position. Procedure Date: 12/05/2018 Normal aortic valve prosthesis.  5. The inferior vena cava was dilated in size with <50% respiratory variability.  6. The interatrial septum was not well visualized.  7. When compared to the prior study: 09/05/2018: 50-55%, moderate to severe stenosis.  SUMMARY   LVEF 55-60%, moderate LVH, incoordinate septal motion, moderate LAE, 26 mm Edwards Sapien 3 TAVR without paravalvular leak, normal gradient, heavy MAC with mild mitral stenosis, dilated IVC, RV pacer noted  Patient Profile     85 y.o. male with DMT2,  HTN, HLD,DMT2,CAD s/p CABG x4V (2004), PAF on Eliquis, CHB s/p PPM, chronic diastolic CHF, COPD, DDD with reduced mobility, and severe ASs/p TAVR (12/05/18) who presents with worsening SOB.  Assessment & Plan    1. SOB 2. Chronic diastolic heart failure 3. COPD 4. S/P recent TAVR 5. Mild mitral stenosis - pt does not appear volume overloaded on exam - remains on supplemental O2 and has cough - symptoms did not improve with increased diuretic at home - BNP mildly elevated to 312.8 with sCr 1.35, baseline appears to be 1.00  - will repeat echo to check valve   6. Hypertension - holding ACEI given mild AKI -  pressures have been controlled   7. DM - 1/2 home lantus and SSI   8. Paroxysmal atrial fibrillation 9. S/p PPM (04/2018) - continue eliquis for anticoagulation - not on rate controlling medications - telemetry with paced rhythm   10. CAD s/p CABG - cath prior to TAVR with 4/4 patent grafts   For questions or updates, please contact [place] Please consult [email or web address] for contact info under        Signed, Ledora Bottcher, PA  12/26/2018, 8:18 AM

## 2018-12-26 NOTE — Progress Notes (Signed)
Pt received from ED to 4E18. Initial assessment, vitals, CHG complete. Tele applied CCMD notified. Pt oriented to room and call bell system. Will continue to monitor.

## 2018-12-26 NOTE — Progress Notes (Signed)
  Echocardiogram 2D Echocardiogram has been performed.  Charles [name] M 12/26/2018, 10:19 AM

## 2018-12-26 NOTE — Care Management Obs Status (Signed)
[place] NOTIFICATION   Patient Details  Name: Charles [name] MRN: [id number] Date of Birth: 1932/04/22   Medicare Observation Status Notification Given:  Yes    Vinie Sill, [place] 12/26/2018, 2:04 PM

## 2018-12-26 NOTE — Progress Notes (Signed)
Inpatient Diabetes Program Recommendations  AACE/ADA: New Consensus Statement on Inpatient Glycemic Control (2015)  Target Ranges:  Prepandial:   less than 140 mg/dL      Peak postprandial:   less than 180 mg/dL (1-2 hours)      Critically ill patients:  140 - 180 mg/dL   Results for JACOBB, ALEN (MRN [id number]) as of 12/26/2018 10:28  Ref. Range 12/26/2018 02:05 12/26/2018 03:26 12/26/2018 06:36  Glucose-Capillary Latest Ref Range: 70 - 99 mg/dL 301 (H) 293 (H) 301 (H)   Review of Glycemic Control  Diabetes history: DM 2 Outpatient Diabetes medications: Lantus 20 units BID, Metformin 1000 mg BID Current orders for Inpatient glycemic control: Lantus 10 units qhs, Novolog 0-9 units tid  A1c 8.9% on 2/28  Inpatient Diabetes Program Recommendations:    Consider increasing Lantus to 10 units BID (half of home dose).   Thanks,  Tama Headings RN, MSN, BC-ADM Inpatient Diabetes Coordinator Team Pager [phone] (8a-5p)

## 2018-12-26 NOTE — ED Notes (Signed)
ED TO INPATIENT HANDOFF REPORT  ED Nurse Name and Phone #: Aaron Edelman [phone]  S Name/Age/Gender Charles [name] 85 y.o. male Room/Bed: 022C/022C  Code Status   Code Status: Full Code  Home/SNF/Other Home Patient oriented to: self, place, time and situation Is this baseline? Yes   Triage Complete: Triage complete  Chief Complaint [facility]  Triage Note Pt here for increased SOB over the last day.  Hx of aortic surgery in last 2 weeks.  Noticeable pitting edema in bilateral lower extremities. A&Ox4. No cough or fever.    Allergies Allergies  Allergen Reactions  . Gabapentin Other (See Comments)    Leg pain, weakness   . Metoprolol Swelling  . Spironolactone Other (See Comments)    Painful gynecomastia  . Amlodipine Other (See Comments)    Edema  . Other Other (See Comments)    Horse serum - myalgias  . Rosiglitazone Maleate Other (See Comments)    REACTION: Did not help  . Tricor [Fenofibrate] Other (See Comments)    myalgias    Level of Care/Admitting Diagnosis ED Disposition    ED Disposition Condition [facility] Area: [place] [100100]  Level of Care: Progressive [102]  Diagnosis: Dyspnea [785885]  Admitting Physician: Milus Banister [[id number]]  Attending Physician: Lelon Perla [[id number]]  PT Class (Do Not Modify): Observation [104]  PT Acc Code (Do Not Modify): Observation [10022]       B Medical/Surgery History Past Medical History:  Diagnosis Date  . Abdominal aortic atherosclerosis ([name])    by xray  . Arthritis    in lower back (06/24/2015)  . Atrial flutter ([place])    notes 06/24/2015  . AV block, [place] 1 09/06/2017   Archie Endo 09/06/2017  . 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 CAD (coronary artery disease) 2004   a. s/p CABG in 2004 with LIMA-LAD, SVG-RCA, SVG-OM, and SVG-RI  . Carotid stenosis 1999   s/p L CEA  . Chronic diastolic CHF (congestive heart failure) ([place])   . Colon polyp  2005   (Dr. Tiffany Kocher)  . Compression fracture of L1 lumbar vertebra (HCC) remote  . COPD (chronic obstructive pulmonary disease) ([place]) 03/2011   by xray  . Depression    hx  . GERD (gastroesophageal reflux disease) 2003   h/o duodenal ulcer per EGD as well as esophagitis  . History of colon polyps 2003, 2005   adenomatous Vira Agar)  . History of tobacco abuse   . HLD (hyperlipidemia)   . HTN (hypertension)   . Presence of permanent cardiac pacemaker   . Psoriasis   . Renal artery stenosis ([place]) 2004   70% bilateral, followed by cards  . 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 Severe aortic stenosis   . T2DM (type 2 diabetes mellitus) ([place]) 1995  . Urge incontinence of urine    Past Surgical History:  Procedure Laterality Date  . CARDIAC CATHETERIZATION  03/06/2003   No intervention - recommend CABG  . CARDIOVASCULAR STRESS TEST  11/2010   normal perfusion, no evidence of ischemia, EF 62% post exercise  . CARDIOVASCULAR STRESS TEST  11/28/2012   Mild diaphragmatic attenuation; cannot exclude a focal region of nontransmural inferior scar  . CARDIOVERSION N/A 06/25/2015   Procedure: CARDIOVERSION;  Surgeon: Pixie Casino, MD;  Location: [place];  Service:  Cardiovascular;  Laterality: N/A;  . CARDIOVERSION N/A 11/02/2016   Procedure: CARDIOVERSION;  Surgeon: [name], MD;  Location: [place];  Service: Cardiovascular;  Laterality: N/A;  . CAROTID ENDARTERECTOMY Left 1999   ([place])  . CATARACT EXTRACTION W/ INTRAOCULAR LENS  IMPLANT, BILATERAL Bilateral 01/2013   [place]  . COLONOSCOPY  2003   colon polyp x3 - adenomatous Tiffany Kocher)  . COLONOSCOPY  10/08/2012   2 TA, diverticulosis, int hem, no rpt rec Tiffany Kocher)  . CORONARY ANGIOPLASTY    . CORONARY ARTERY BYPASS GRAFT  03/07/2003   4v CABG ([place]) with LIMA to LAD, vein graft to RCA, 1st obtuse marginal, and  ramus intermedius  . ESOPHAGOGASTRODUODENOSCOPY  10/08/2012   nl esophagus, duodenitis and erosive gastropathy, path - gastropathy no Hpylori, no rpt rec  . LUMBAR EPIDURAL INJECTION  03/2017   L5/S1 ([place])  . PACEMAKER IMPLANT N/A 04/21/2018   Procedure: PACEMAKER IMPLANT -- Dual Chamber;  Surgeon: Deboraha Sprang, MD;  Location: [place] [facility];  Service: Cardiovascular;  Laterality: N/A;  . PERIPHERAL VASCULAR CATHETERIZATION N/A 02/02/2016   Procedure: Renal Angiography;  Surgeon: [name], MD;  Location: [place] [facility];  Service: Cardiovascular;  Laterality: N/A;  . PERIPHERAL VASCULAR CATHETERIZATION N/A 02/02/2016   Procedure: Abdominal Aortogram;  Surgeon: [name], MD;  Location: [place] [facility];  Service: Cardiovascular;  Laterality: N/A;  . RENAL DOPPLER  11/29/2011   Celiac&SMA-demonstrated vessel narrowing suggestive of a greater than 50% diameter reduction. Bilateral renal arteries-demonstrated vessel narrowing of 60-99% diameter reduction. Rt Kidney-mid pole lateral simple cyst noted measuring 1.29x0.76x1.11cm and exophytic cyst outside lower pole measuring 1.23x0.96x1.31. Lft Kidney-lateral mid to lower pole simple cyst measuering-1.24x9.83x1.24  . RIGHT/LEFT HEART CATH AND CORONARY/GRAFT ANGIOGRAPHY N/A 10/20/2017   Procedure: RIGHT/LEFT HEART CATH AND CORONARY/GRAFT ANGIOGRAPHY;  Surgeon: Burnell Blanks, MD;  Location: [place] [facility];  Service: Cardiovascular;  Laterality: N/A;  . TEE WITHOUT CARDIOVERSION N/A 06/25/2015   Procedure: TRANSESOPHAGEAL ECHOCARDIOGRAM (TEE);  Surgeon: Pixie Casino, MD;  Location: [facility] ENDOSCOPY;  Service: Cardiovascular;  Laterality: N/A;  . TEE WITHOUT CARDIOVERSION N/A 11/02/2016   Procedure: TRANSESOPHAGEAL ECHOCARDIOGRAM (TEE);  Surgeon: [name], MD;  Location: [place];  Service: Cardiovascular;  Laterality: N/A;  . TEE WITHOUT CARDIOVERSION N/A 12/05/2018   Procedure: TRANSESOPHAGEAL ECHOCARDIOGRAM  (TEE);  Surgeon: Sherren Mocha, MD;  Location: [place];  Service: Open Heart Surgery;  Laterality: N/A;  . TONSILLECTOMY    . TRANSCATHETER AORTIC VALVE REPLACEMENT, TRANSFEMORAL  12/2018   [place]  . TRANSCATHETER AORTIC VALVE REPLACEMENT, TRANSFEMORAL N/A 12/05/2018   Procedure: TRANSCATHETER AORTIC VALVE REPLACEMENT, TRANSFEMORAL;  Surgeon: Sherren Mocha, MD;  Location: [facility];  Service: Open Heart Surgery;  Laterality: N/A;  . UPPER GASTROINTESTINAL ENDOSCOPY  2003   reflux esophagitis, erosive gastropathy, duodenal ulcer     A IV Location/Drains/Wounds Patient Lines/Drains/Airways Status   Active Line/Drains/Airways    Name:   Placement date:   Placement time:   Site:   Days:   Peripheral IV 12/25/18 Left Hand   12/25/18    -    Hand   1   Incision (Closed) 04/21/18 Chest Left;Anterior;Upper   04/21/18    2000     249   Incision (Closed) 12/05/18 Groin Right   12/05/18    1319     21   Incision (Closed) 12/05/18 Groin Left   12/05/18    1319     21  Intake/Output Last 24 hours No intake or output data in the 24 hours ending 12/26/18 0227  Labs/Imaging Results for orders placed or performed during the hospital encounter of 12/25/18 (from the past 48 hour(s))  CBC with Differential     Status: Abnormal   Collection Time: 12/25/18 10:46 PM  Result Value Ref Range   WBC 6.2 4.0 - 10.5 K/uL   RBC 4.06 (L) 4.22 - 5.81 MIL/uL   Hemoglobin 11.7 (L) 13.0 - 17.0 g/dL   HCT 37.6 (L) 39.0 - 52.0 %   MCV 92.6 80.0 - 100.0 fL   MCH 28.8 26.0 - 34.0 pg   MCHC 31.1 30.0 - 36.0 g/dL   RDW 12.4 11.5 - 15.5 %   Platelets 179 150 - 400 K/uL   nRBC 0.0 0.0 - 0.2 %   Neutrophils Relative % 63 %   Neutro Abs 3.9 1.7 - 7.7 K/uL   Lymphocytes Relative 19 %   Lymphs Abs 1.2 0.7 - 4.0 K/uL   Monocytes Relative 12 %   Monocytes Absolute 0.8 0.1 - 1.0 K/uL   Eosinophils Relative 4 %   Eosinophils Absolute 0.3 0.0 - 0.5 K/uL   Basophils Relative 1 %   Basophils Absolute 0.0 0.0 - 0.1  K/uL   Immature Granulocytes 1 %   Abs Immature Granulocytes 0.03 0.00 - 0.07 K/uL    Comment: Performed at [facility], [address] [place], [place] [address]  Basic metabolic panel     Status: Abnormal   Collection Time: 12/25/18 10:46 PM  Result Value Ref Range   Sodium 133 (L) 135 - 145 mmol/L   Potassium 4.8 3.5 - 5.1 mmol/L   Chloride 95 (L) 98 - 111 mmol/L   CO2 28 22 - 32 mmol/L   Glucose, Bld 389 (H) 70 - 99 mg/dL   BUN 31 (H) 8 - 23 mg/dL   Creatinine, Ser 1.35 (H) 0.61 - 1.24 mg/dL   Calcium 9.2 8.9 - 10.3 mg/dL   GFR calc non Af Amer 48 (L) >60 mL/min   GFR calc Af Amer 55 (L) >60 mL/min   Anion gap 10 5 - 15    Comment: Performed at [facility] [address], [place], [place] [address]  Brain natriuretic peptide     Status: Abnormal   Collection Time: 12/25/18 10:46 PM  Result Value Ref Range   B Natriuretic Peptide 312.8 (H) 0.0 - 100.0 pg/mL    Comment: Performed at [facility] [address], [place], [place] [address]  Protime-INR     Status: None   Collection Time: 12/25/18 10:46 PM  Result Value Ref Range   Prothrombin Time 14.3 11.4 - 15.2 seconds   INR 1.1 0.8 - 1.2    Comment: (NOTE) INR goal varies based on device and disease states. Performed at [facility], [place] [address], [place], [place] [address]   I-stat Creatinine, ED     Status: Abnormal   Collection Time: 12/25/18 10:52 PM  Result Value Ref Range   Creatinine, Ser 1.30 (H) 0.61 - 1.24 mg/dL  POCT I-Stat EG7     Status: Abnormal   Collection Time: 12/25/18 10:52 PM  Result Value Ref Range   pH, Ven 7.389 7.250 - 7.430   pCO2, Ven 49.8 44.0 - 60.0 mmHg   pO2, Ven 54.0 (H) 32.0 - 45.0 mmHg   Bicarbonate 30.0 (H) 20.0 - 28.0 mmol/L   TCO2 32 22 - 32  mmol/L   O2 Saturation 86.0 %   Acid-Base Excess 4.0 (H) 0.0 - 2.0 mmol/L   Sodium 134 (L) 135 - 145 mmol/L   Potassium 4.9 3.5 - 5.1 mmol/L   Calcium, Ion 1.15 1.15 - 1.40 mmol/L   HCT 36.0 (L) 39.0 - 52.0 %    Hemoglobin 12.2 (L) 13.0 - 17.0 g/dL   Patient temperature HIDE    Sample type VENOUS   I-stat troponin, ED     Status: None   Collection Time: 12/25/18 11:24 PM  Result Value Ref Range   Troponin i, poc 0.06 0.00 - 0.08 ng/mL   Comment 3            Comment: Due to the release kinetics of cTnI, a negative result within the first hours of the onset of symptoms does not rule out myocardial infarction with certainty. If myocardial infarction is still suspected, repeat the test at appropriate intervals.   CBG monitoring, ED     Status: Abnormal   Collection Time: 12/26/18  2:05 AM  Result Value Ref Range   Glucose-Capillary 301 (H) 70 - 99 mg/dL   Dg Chest 2 View  Result Date: 12/25/2018 CLINICAL DATA:  Increased shortness of breath. Recent transcatheter aortic valve replacement. EXAM: CHEST - 2 VIEW COMPARISON:  Radiograph 12/05/2018, 12/01/2018 FINDINGS: Post median sternotomy. Left-sided pacemaker in place. Post transcatheter aortic valve replacement. Cardiomegaly is unchanged. Chronic elevation of left hemidiaphragm with basilar atelectasis, unchanged. No pulmonary edema. No acute airspace disease or pneumothorax. No pleural effusion. Degenerative change in the spine. IMPRESSION: Chronic elevation of left hemidiaphragm with left basilar atelectasis. No acute abnormality or change from prior exam. Electronically Signed   By: Keith Rake M.D.   On: 12/25/2018 23:16    Pending Labs Unresulted Labs (From admission, onward)    Start     Ordered   12/27/18 3790  Basic metabolic panel  Daily,   R     12/26/18 0138          Vitals/Pain Today's Vitals   12/26/18 0030 12/26/18 0130 12/26/18 0145 12/26/18 0200  BP: (!) 143/106 126/81  135/86  Pulse: 87 86 84 85  Resp: (!) 21 (!) 29 (!) 26 (!) 22  Temp:      TempSrc:      SpO2: 99% 98% 99% 97%  PainSc:        Isolation Precautions No active isolations  Medications Medications  aspirin chewable tablet 81 mg (has no  administration in time range)  atorvastatin (LIPITOR) tablet 20 mg (has no administration in time range)  citalopram (CELEXA) tablet 20 mg (has no administration in time range)  insulin glargine (LANTUS) injection 10 Units (has no administration in time range)  pantoprazole (PROTONIX) EC tablet 20 mg (has no administration in time range)  finasteride (PROSCAR) tablet 5 mg (has no administration in time range)  tamsulosin (FLOMAX) capsule 0.4 mg (has no administration in time range)  apixaban (ELIQUIS) tablet 5 mg (has no administration in time range)  sodium chloride flush (NS) 0.9 % injection 3 mL (has no administration in time range)  sodium chloride flush (NS) 0.9 % injection 3 mL (has no administration in time range)  0.9 %  sodium chloride infusion (has no administration in time range)  acetaminophen (TYLENOL) tablet 650 mg (has no administration in time range)  ondansetron (ZOFRAN) injection 4 mg (has no administration in time range)  insulin aspart (novoLOG) injection 0-9 Units (has no administration in time range)  ipratropium-albuterol (DUONEB) 0.5-2.5 (3) MG/3ML nebulizer solution 3 mL (3 mLs Nebulization Given 12/26/18 0209)    Mobility walks Low fall risk   Focused Assessments Cardiac Assessment Handoff:    Lab Results  Component Value Date   TROPONINI 0.06 ([place]) 03/26/2018   No results found for: DDIMER Does the Patient currently have chest pain? No  , Pulmonary Assessment Handoff:  Lung sounds: Bilateral Breath Sounds: Diminished, Expiratory wheezes O2 Device: Nasal Cannula O2 Flow Rate (L/min): 2 L/min      R Recommendations: See Admitting Provider Note  Report given to:   Additional Notes:  Recent surgery, expiratory wheezes treated with duoneb. No CP. CHF managed with lasix, on eliquis. And DM T2.  Lantus from pharmacy.

## 2018-12-27 ENCOUNTER — Encounter (HOSPITAL_COMMUNITY): Payer: Self-pay | Admitting: General Practice

## 2018-12-27 ENCOUNTER — Other Ambulatory Visit: Payer: Self-pay | Admitting: Cardiology

## 2018-12-27 ENCOUNTER — Telehealth: Payer: Self-pay | Admitting: Cardiovascular Disease

## 2018-12-27 DIAGNOSIS — Z79899 Other long term (current) drug therapy: Secondary | ICD-10-CM

## 2018-12-27 DIAGNOSIS — Z952 Presence of prosthetic heart valve: Secondary | ICD-10-CM | POA: Diagnosis not present

## 2018-12-27 DIAGNOSIS — Z7901 Long term (current) use of anticoagulants: Secondary | ICD-10-CM

## 2018-12-27 DIAGNOSIS — N182 Chronic kidney disease, stage 2 (mild): Secondary | ICD-10-CM

## 2018-12-27 DIAGNOSIS — E118 Type 2 diabetes mellitus with unspecified complications: Secondary | ICD-10-CM

## 2018-12-27 DIAGNOSIS — N183 Chronic kidney disease, stage 3 unspecified: Secondary | ICD-10-CM

## 2018-12-27 DIAGNOSIS — R0609 Other forms of dyspnea: Secondary | ICD-10-CM | POA: Diagnosis not present

## 2018-12-27 DIAGNOSIS — I5033 Acute on chronic diastolic (congestive) heart failure: Secondary | ICD-10-CM | POA: Diagnosis not present

## 2018-12-27 DIAGNOSIS — E1165 Type 2 diabetes mellitus with hyperglycemia: Secondary | ICD-10-CM

## 2018-12-27 DIAGNOSIS — I2510 Atherosclerotic heart disease of native coronary artery without angina pectoris: Secondary | ICD-10-CM

## 2018-12-27 LAB — BASIC METABOLIC PANEL
Anion gap: 5 (ref 5–15)
BUN: 24 mg/dL — AB (ref 8–23)
CO2: 26 mmol/L (ref 22–32)
Calcium: 8.6 mg/dL — ABNORMAL LOW (ref 8.9–10.3)
Chloride: 103 mmol/L (ref 98–111)
Creatinine, Ser: 1.23 mg/dL (ref 0.61–1.24)
GFR calc Af Amer: >60 mL/min (ref >60)
GFR calc non Af Amer: 54 mL/min — ABNORMAL LOW (ref >60)
Glucose, Bld: 211 mg/dL — ABNORMAL HIGH (ref 70–99)
Potassium: 3.9 mmol/L (ref 3.5–5.1)
Sodium: 134 mmol/L — ABNORMAL LOW (ref 135–145)

## 2018-12-27 LAB — GLUCOSE, CAPILLARY
Glucose-Capillary: 202 mg/dL — ABNORMAL HIGH (ref 70–99)
Glucose-Capillary: 200 mg/dL — ABNORMAL HIGH (ref 70–99)

## 2018-12-27 NOTE — Progress Notes (Signed)
Progress Note  Patient Name: Charles [name] Date of Encounter: 12/27/2018  Primary Cardiologist: [name], MD   Subjective   Feels better this morning.  He is lying flat.  Did not sleep much because of constant interruption.  Baseline torsemide therapy prior to recent outpatient escalation was 20 mg twice daily.  IV fluids discontinued this morning.  BUN and creatinine have improved with overnight hydration.  O2 saturation 97%.  No chest pain.  No orthopnea or PND.  He has not ambulated.  Inpatient Medications    Scheduled Meds: . apixaban  5 mg Oral BID  . aspirin  81 mg Oral Daily  . atorvastatin  20 mg Oral Daily  . citalopram  20 mg Oral Daily  . finasteride  5 mg Oral Daily  . insulin aspart  0-9 Units Subcutaneous TID WC  . insulin glargine  10 Units Subcutaneous QHS  . pantoprazole  20 mg Oral Daily  . sodium chloride flush  3 mL Intravenous Q12H  . tamsulosin  0.4 mg Oral QPC supper   Continuous Infusions: . sodium chloride    . sodium chloride Stopped (12/27/18 1000)   PRN Meds: sodium chloride, acetaminophen, ondansetron (ZOFRAN) IV, sodium chloride flush   Vital Signs    Vitals:   12/26/18 2138 12/27/18 0102 12/27/18 0503 12/27/18 0807  BP:  (!) 142/76 (!) 155/80 131/77  Pulse: 97 84 86 88  Resp: 18  17 (!) 21  Temp:  97.8 F (36.6 C) 98.2 F (36.8 C) 97.8 F (36.6 C)  TempSrc:  Oral Oral Oral  SpO2: 93% 96% 98% 97%  Weight:   96.9 kg   Height:        Intake/Output Summary (Last 24 hours) at 12/27/2018 1002 Last data filed at 12/27/2018 0814 Gross per 24 hour  Intake 804.42 ml  Output 1201 ml  Net -396.58 ml   Last 3 Weights 12/27/2018 12/26/2018 12/14/2018  Weight (lbs) 213 lb 10 oz 214 lb 4.6 oz 226 lb 6.4 oz  Weight (kg) 96.9 kg 97.2 kg 102.694 kg      Telemetry    Atrial tracking with ventricular pacing.- Personally Reviewed  ECG    Atrial tracking with ventricular pacing point 12/25/2018- Personally Reviewed  Physical Exam   Elderly and frail GEN: No acute distress lying flat in the bed on his left side. Neck: No JVD Cardiac: RRR, no murmurs, rubs, or gallops.  Respiratory:  Coarse rhonchi. GI: Soft, nontender, non-distended  MS: No edema. Neuro:  Nonfocal  Psych: Normal affect   Labs    Chemistry Recent Labs  Lab 12/25/18 2246 12/25/18 2252 12/26/18 1450 12/27/18 0259  NA 133* 134* 136 134*  K 4.8 4.9 4.3 3.9  CL 95*  --  101 103  CO2 28  --  29 26  GLUCOSE 389*  --  243* 211*  BUN 31*  --  30* 24*  CREATININE 1.35* 1.30* 1.43* 1.23  CALCIUM 9.2  --  9.0 8.6*  GFRNONAA 48*  --  45* 54*  GFRAA 55*  --  52* >60  ANIONGAP 10  --  6 5     Hematology Recent Labs  Lab 12/25/18 2246 12/25/18 2252  WBC 6.2  --   RBC 4.06*  --   HGB 11.7* 12.2*  HCT 37.6* 36.0*  MCV 92.6  --   MCH 28.8  --   MCHC 31.1  --   RDW 12.4  --   PLT 179  --  Cardiac EnzymesNo results for input(s): TROPONINI in the last 168 hours.  Recent Labs  Lab 12/25/18 2324  TROPIPOC 0.06     BNP Recent Labs  Lab 12/25/18 2246  BNP 312.8*     DDimer No results for input(s): DDIMER in the last 168 hours.   Radiology    Dg Chest 2 View  Result Date: 12/25/2018 CLINICAL DATA:  Increased shortness of breath. Recent transcatheter aortic valve replacement. EXAM: CHEST - 2 VIEW COMPARISON:  Radiograph 12/05/2018, 12/01/2018 FINDINGS: Post median sternotomy. Left-sided pacemaker in place. Post transcatheter aortic valve replacement. Cardiomegaly is unchanged. Chronic elevation of left hemidiaphragm with basilar atelectasis, unchanged. No pulmonary edema. No acute airspace disease or pneumothorax. No pleural effusion. Degenerative change in the spine. IMPRESSION: Chronic elevation of left hemidiaphragm with left basilar atelectasis. No acute abnormality or change from prior exam. Electronically Signed   By: Keith Rake M.D.   On: 12/25/2018 23:16    Cardiac Studies   2D Doppler echocardiogram as outlined  yesterday reveals mild perivalvular leak:  2D Doppler echocardiogram 12/26/2018 IMPRESSIONS    1. The left ventricle has low normal systolic function, with an ejection fraction of 50-55%. The cavity size was normal. There is moderately increased left ventricular wall thickness. Left ventricular diastolic Doppler parameters are consistent with  impaired relaxation. Elevated left atrial and left ventricular end-diastolic pressures The E/e' is >20. There is abnormal septal motion consistent with RV pacemaker.  2. Moderate hypokinesis of the left ventricular, basal-mid septal wall.  3. The mitral valve is degenerative. Mild thickening of the mitral valve leaflet. Moderate calcification of the mitral valve leaflet. There is moderate mitral annular calcification present. Mild mitral valve stenosis.  4. The tricuspid valve is grossly normal.  5. A 26 an Edwards Edwards Sapien bioprosthetic aortic valve (TAVR) valve is present in the aortic position. Procedure Date: 12/05/2018 Echo findings are consistent with mild perivalvular leak of the aortic prosthesis.  6. Moderate calcification of the aortic valve. mild stenosis of the aortic valve.  7. Left atrial size was moderately dilated.  8. Right atrial size was moderately dilated.  9. The right ventricle has normal systolic function. The cavity was normal. There is no increase in right ventricular wall thickness. 10. When compared to the prior study: 12/06/2018: LVEF 55-60%.  Patient Profile     85 y.o. male with DMT2, HTN, HLD,DMT2,CAD s/p CABG x4V (2004), PAF on Eliquis, CHB s/p PPM, chronic diastolic CHF, COPD, DDD with reduced mobility, and severe ASs/p TAVR (12/05/18) who presentswith worsening SOB and lower extremity edema.  Found to be dehydrated without lower extremity edema upon admission.  IV fluids administered and the patient has significantly improved in terms of strength and breathing.  Assessment & Plan    1. Dyspnea has improved.  No  clinical evidence of volume overload after overnight hydration. 2. Lower extremity edema had resolved prior to admission. 3. TAVR with mild perivalvular leak.  Leak appears trivial and I do not believe is related to his dyspnea. 4. DDD pacemaker with atrial sensed ventricular paced rhythm 5. CKD 3 improved to CKD 2 with hydration.   Plan is to ambulate today.  IV fluids have been discontinued.  If he ambulates, eats well, and has no significant dyspnea we will plan to discharge patient on torsemide 20 mg twice daily with 5 to 7-day follow-up and basic metabolic panel at that time.  For questions or updates, please contact [place] Please consult [email or web address] for contact info under  Signed, Sinclair Grooms, MD  12/27/2018, 10:02 AM

## 2018-12-27 NOTE — Telephone Encounter (Signed)
TOC patient   Please call patient   Patient has a appt with march 31,2020

## 2018-12-27 NOTE — Discharge Summary (Addendum)
The patient has been seen in conjunction with Reino Bellis, NP. All aspects of care have been considered and discussed. The patient has been personally interviewed, examined, and all clinical data has been reviewed.   Clinically improved.  Overall clinical syndrome responded well to IV hydration.  Being discharged on torsemide 20 mg twice daily.  Will be scheduled for early be met to follow-up.  Discussed with interdisciplinary heart valve team.  Discharge Summary    Patient ID: Charles [name],  MRN: [id number], DOB/AGE: May 17, 1932 85 y.o.  Admit date: 12/25/2018 Discharge date: 12/27/2018  Primary Care Provider: [name] Primary Cardiologist: Dr. [name]   Discharge Diagnoses    Active Problems:   Diabetes mellitus type 2, uncontrolled, with complications ([place])   Chronic anticoagulation   Acute on chronic diastolic CHF (congestive heart failure) ([place])   Coronary artery disease involving native coronary artery of native heart without angina pectoris   S/P TAVR (transcatheter aortic valve replacement)   Dyspnea   CKD (chronic kidney disease), stage II   Allergies Allergies  Allergen Reactions   Gabapentin Other (See Comments)    Leg pain, weakness    Metoprolol Swelling   Spironolactone Other (See Comments)    Painful gynecomastia   Amlodipine Other (See Comments)    Edema   Other Other (See Comments)    Horse serum - myalgias   Rosiglitazone Maleate Other (See Comments)    REACTION: Did not help   Tricor [Fenofibrate] Other (See Comments)    myalgias    Diagnostic Studies/Procedures    TTE: 12/26/2018  IMPRESSIONS    1. The left ventricle has low normal systolic function, with an ejection fraction of 50-55%. The cavity size was normal. There is moderately increased left ventricular wall thickness. Left ventricular diastolic Doppler parameters are consistent with  impaired relaxation. Elevated left atrial and left ventricular  end-diastolic pressures The E/e' is >20. There is abnormal septal motion consistent with RV pacemaker.  2. Moderate hypokinesis of the left ventricular, basal-mid septal wall.  3. The mitral valve is degenerative. Mild thickening of the mitral valve leaflet. Moderate calcification of the mitral valve leaflet. There is moderate mitral annular calcification present. Mild mitral valve stenosis.  4. The tricuspid valve is grossly normal.  5. A 26 an Edwards Edwards Sapien bioprosthetic aortic valve (TAVR) valve is present in the aortic position. Procedure Date: 12/05/2018 Echo findings are consistent with mild perivalvular leak of the aortic prosthesis.  6. Moderate calcification of the aortic valve. mild stenosis of the aortic valve.  7. Left atrial size was moderately dilated.  8. Right atrial size was moderately dilated.  9. The right ventricle has normal systolic function. The cavity was normal. There is no increase in right ventricular wall thickness. 10. When compared to the prior study: 12/06/2018: LVEF 55-60%. _____________   History of Present Illness     85 y.o. male with DMT2, HTN, HLD,DMT2,CAD s/p CABG x4V (2004), PAF on Eliquis, CHB s/p PPM, chronic diastolic CHF, COPD, DDD with reduced mobility, and severe ASs/p TAVR (12/05/18) who presented with worsening SOB.   Charles [name] underwent TAVR on 12/05/18. After discharge, patient noted to have worsening LE edema and shortness of breath for which his torsemide was increased from 51m daily to 432mtwice a day. He stated that this dramatically improved his LE edema and dyspnea. However, over the past week he had again noted worsening orthopnea and dyspnea on exertion. He had been taking 404morsemide in  the morning and 20 mg in the evening with no improvement of his symptoms. The evening of his admission, his son noted that he was more labored in his breathing and a home oximeter showed an SpO2 of 90%.   In the ER, CXR was without significant  pulmonary edema and without obvious infiltrate. Labs were notable for a Cr of 1.35 (ip from 1.16) with a BNP of 312 (down from ~ 500). No leukocytosis of lymphopenia. Initial troponin 0.06.   Cardiology was consulted for admission.   Hospital Course     He was admitted and his torsemide held given possible volume depletion. TTE showed stable valve placement with mild perivalvular leak. He actually became hypotensive the 2nd evening of admission and given IVFs with improvement. Labs improved with Cr from 1.43>>1.23. His symptoms significantly improved. No evidence of overload the following morning. He was able to ambulate without dyspnea. His home medications were restarted, and planned to resume torsemide at 38m BID at the time of discharge. Discussed with the patient's daughter in law who is agreeable to assist patient with a possible tele-health visit. Will arrange this as a TOC follow up. Instructed to inform at this call if patient may need face to face visit. Of note, duplicate K+ order, instructed to continue home dose as prior to admission.    Charles TURNLEYwas seen by Dr. STamala Julianand determined stable for discharge home. Follow up in the office has been arranged. Medications are listed below.   _____________  Discharge Vitals Blood pressure (!) 154/77, pulse 90, temperature 98 F (36.7 C), temperature source Oral, resp. rate 20, height '6\' 1"'  (1.854 m), weight 96.9 kg, SpO2 97 %.  Filed Weights   12/26/18 0315 12/27/18 0503  Weight: 97.2 kg 96.9 kg    Labs & Radiologic Studies    CBC Recent Labs    12/25/18 2246 12/25/18 2252  WBC 6.2  --   NEUTROABS 3.9  --   HGB 11.7* 12.2*  HCT 37.6* 36.0*  MCV 92.6  --   PLT 179  --    Basic Metabolic Panel Recent Labs    12/26/18 1450 12/27/18 0259  NA 136 134*  K 4.3 3.9  CL 101 103  CO2 29 26  GLUCOSE 243* 211*  BUN 30* 24*  CREATININE 1.43* 1.23  CALCIUM 9.0 8.6*   Liver Function Tests No results for input(s): AST,  ALT, ALKPHOS, BILITOT, PROT, ALBUMIN in the last 72 hours. No results for input(s): LIPASE, AMYLASE in the last 72 hours. Cardiac Enzymes No results for input(s): CKTOTAL, CKMB, CKMBINDEX, TROPONINI in the last 72 hours. BNP Invalid input(s): POCBNP D-Dimer No results for input(s): DDIMER in the last 72 hours. Hemoglobin A1C No results for input(s): HGBA1C in the last 72 hours. Fasting Lipid Panel No results for input(s): CHOL, HDL, LDLCALC, TRIG, CHOLHDL, LDLDIRECT in the last 72 hours. Thyroid Function Tests No results for input(s): TSH, T4TOTAL, T3FREE, THYROIDAB in the last 72 hours.  Invalid input(s): FREET3 _____________  Dg Chest 2 View  Result Date: 12/25/2018 CLINICAL DATA:  Increased shortness of breath. Recent transcatheter aortic valve replacement. EXAM: CHEST - 2 VIEW COMPARISON:  Radiograph 12/05/2018, 12/01/2018 FINDINGS: Post median sternotomy. Left-sided pacemaker in place. Post transcatheter aortic valve replacement. Cardiomegaly is unchanged. Chronic elevation of left hemidiaphragm with basilar atelectasis, unchanged. No pulmonary edema. No acute airspace disease or pneumothorax. No pleural effusion. Degenerative change in the spine. IMPRESSION: Chronic elevation of left hemidiaphragm with left basilar atelectasis. No  acute abnormality or change from prior exam. Electronically Signed   By: Keith Rake M.D.   On: 12/25/2018 23:16   Dg Chest 2 View  Result Date: 12/01/2018 CLINICAL DATA:  Pre-op for heart valve surgery - hx of CHF, htn, diabetes, CAD, COPD, exsmoker, PNA, dyspnea regularly, EXAM: CHEST - 2 VIEW COMPARISON:  04/22/2018 FINDINGS: Stable changes from prior CABG surgery. Cardiac silhouette is normal in size. No mediastinal or hilar masses. No convincing adenopathy. Mild elevation of left hemidiaphragm. There is mild opacity at the left lateral lung base consistent with atelectasis. Lungs otherwise clear. No pleural effusion or pneumothorax. Left anterior  chest wall pacemaker is stable and well positioned. Skeletal structures are demineralized but grossly intact. IMPRESSION: No acute cardiopulmonary disease. Electronically Signed   By: Lajean Manes M.D.   On: 12/01/2018 15:41   Dg Chest Port 1 View  Result Date: 12/05/2018 CLINICAL DATA:  Status post TAVR. EXAM: PORTABLE CHEST 1 VIEW COMPARISON:  Chest radiograph December 01, 2018 FINDINGS: Low inspiratory examination. New aortic stent. Stable cardiomegaly. Status post median sternotomy for CABG. Calcified aortic arch. Stable rounded density LEFT lung base atelectasis versus scarring. No pneumothorax. LEFT cardiac pacemaker in situ. RIGHT internal jugular central venous catheter distal tip obscured by pacemaker leads. Osteopenia. IMPRESSION: 1. Interval TAVR. 2. Stable cardiomegaly.  No acute pulmonary process. Electronically Signed   By: Elon Alas M.D.   On: 12/05/2018 18:31   Disposition   Pt is being discharged home today in good condition.  Follow-up Plans & Appointments    Follow-up Information    [name], MD Follow up on 01/02/2019.   Specialties:  Cardiology, Radiology Why:  at 11:20am for your follow up appt. This will be scheduled as a telehealth visit. Please arrange your MyChart account for this.  Contact information: [address] [place] [address] [phone]        [facility] Follow up on 01/01/2019.   Specialty:  Cardiology Why:  Please come by for follow up lab work.  Contact information: [address] [place] [phone]         Discharge Instructions    Diet - low sodium heart healthy   Complete by:  As directed    Increase activity slowly   Complete by:  As directed      Discharge Medications     Medication List        TAKE these medications   acetaminophen 500 MG tablet Commonly known as:  TYLENOL Take 1 tablet (500 mg total) by mouth 3 (three) times  daily.   aspirin 81 MG chewable tablet Chew 1 tablet (81 mg total) by mouth daily.   atorvastatin 20 MG tablet Commonly known as:  LIPITOR Take 1 tablet (20 mg total) by mouth daily.   citalopram 20 MG tablet Commonly known as:  CELEXA Take 1 tablet (20 mg total) by mouth daily.   Eliquis 5 MG Tabs tablet Generic drug:  apixaban Take 1 tablet (5 mg total) by mouth 2 (two) times daily. What changed:  See the new instructions.   finasteride 5 MG tablet Commonly known as:  PROSCAR Take 1 tablet (5 mg total) by mouth daily.   glucose blood test strip Commonly known as:  Accu-Chek Aviva Plus 1 each by Other route as needed for other. Use as instructed to check blood sugar 3 (three) times a day.  Dx code:  E11.8, E11.65   guaiFENesin 600  MG 12 hr tablet Commonly known as:  MUCINEX Take 600 mg by mouth 2 (two) times daily as needed for cough or to loosen phlegm.   insulin glargine 100 UNIT/ML injection Commonly known as:  LANTUS Inject 20 Units into the skin at bedtime. Started using 20 units twice daily to control BS   lisinopril 40 MG tablet Commonly known as:  PRINIVIL,ZESTRIL Take 1 tablet (40 mg total) by mouth daily.   metFORMIN 1000 MG tablet Commonly known as:  GLUCOPHAGE Take 1 tablet (1,000 mg total) by mouth 2 (two) times daily with a meal.   nitroGLYCERIN 0.4 MG SL tablet Commonly known as:  NITROSTAT Place 1 tablet (0.4 mg total) under the tongue every 5 (five) minutes as needed for chest pain.   pantoprazole 20 MG tablet Commonly known as:  PROTONIX Take 1 tablet (20 mg total) by mouth daily. What changed:  See the new instructions.   Potassium Chloride ER 20 MEQ Tbcr Take 20 mEq by mouth every Monday, Wednesday, Friday, Saturday, and Sunday at 6 PM.   tamsulosin 0.4 MG Caps capsule Commonly known as:  FLOMAX Take 1 capsule (0.4 mg total) by mouth daily after supper.   torsemide 20 MG tablet Commonly known as:  DEMADEX Take 1 tablet (20 mg total) by  mouth 2 (two) times daily.   traZODone 50 MG tablet Commonly known as:  DESYREL Take 50 mg by mouth at bedtime.        Acute coronary syndrome (MI, NSTEMI, STEMI, etc) this admission?: No.     Outstanding Labs/Studies   BMET in one week.   Duration of Discharge Encounter   Greater than 30 minutes including physician time.  Signed, Reino Bellis NP-C 12/27/2018, 2:01 PM

## 2018-12-27 NOTE — Discharge Instructions (Signed)
° °  Please ensure you have setup your MyChart account in order to access care with a Tele-health visit which                                                                         has been arranged.

## 2018-12-27 NOTE — Progress Notes (Signed)
Pt discharged home with son. Discharge instructions given to patient. IV and telemetry box removed. Pt left with all of his belongings, including his hearing aids. Pt discharged via wheelchair and was accompanied by a [employer] employee.   Ara Kussmaul BSN, RN

## 2018-12-28 NOTE — Telephone Encounter (Signed)
Attempted to contact patient regarding TOC appointment.  LVM to call back.

## 2019-01-01 ENCOUNTER — Telehealth: Payer: Self-pay

## 2019-01-01 ENCOUNTER — Other Ambulatory Visit: Payer: Self-pay

## 2019-01-01 NOTE — Telephone Encounter (Signed)
Spoke with pt, he would like to do a video visit with dr berry tomorrow with his smart phone. They will get the webex app and check his bp and weight.

## 2019-01-01 NOTE — Telephone Encounter (Signed)
Left voicemail message on home/miobile number provided updating pt with new appt time on 3/31 at 1:00pm

## 2019-01-02 ENCOUNTER — Telehealth: Payer: Self-pay

## 2019-01-02 ENCOUNTER — Other Ambulatory Visit: Payer: Self-pay

## 2019-01-02 ENCOUNTER — Telehealth (INDEPENDENT_AMBULATORY_CARE_PROVIDER_SITE_OTHER): Payer: Medicare Other | Admitting: Cardiovascular Disease

## 2019-01-02 ENCOUNTER — Encounter: Payer: Self-pay | Admitting: Cardiovascular Disease

## 2019-01-02 VITALS — BP 144/91 | HR 101 | Wt 213.8 lb

## 2019-01-02 DIAGNOSIS — Z952 Presence of prosthetic heart valve: Secondary | ICD-10-CM

## 2019-01-02 NOTE — Telephone Encounter (Signed)
Cardiac Questionnaire:    Since your last visit or hospitalization:    1. Have you been having new or worsening chest pain? NO   2. Have you been having new or worsening shortness of breath? NO 3. Have you been having new or worsening leg swelling, wt gain, or increase in abdominal girth (pants fitting more tightly)? NO   4. Have you had any passing out spells? NO    *A YES to any of these questions would result in the appointment being kept. *If all the answers to these questions are NO, we should indicate that given the current situation regarding the worldwide coronarvirus pandemic, at the recommendation of the CDC, we are looking to limit gatherings in our waiting area, and thus will reschedule their appointment beyond four weeks from today.   _____________   [id number] Pre-Screening Questions:  . Do you currently have a fever? NO (yes = cancel and refer to pcp for e-visit) . Have you recently travelled on a cruise, internationally, or to [place], [place], [place], [place], [place], or [place], [place] [name], stay home, monitor symptoms, [name] (yes = cancel, stay home, away from tested individual, monitor symptoms, and contact pcp or initiate e-visit if symptoms develop) . Are you currently experiencing fatigue or cough? GENERALLY TIRED BUT IS HIS NORM (yes = pt should be prepared to have a mask placed at the time of their visit).            Virtual Visit Pre-Appointment Phone Call  Steps For Call:  1. Confirm consent - "In the setting of the current Covid19 crisis, you are scheduled for a (phone or video) visit with your provider on (date) at (time).  Just as we do with many in-office visits, in order for you to participate in this visit, we must obtain consent.  If you'd like,  I can send this to your mychart (if signed up) or email for you to review.  Otherwise, I can obtain your verbal consent now.  All virtual visits are billed to your insurance company just like a normal visit would be.  By agreeing to a virtual visit, we'd like you to understand that the technology does not allow for your provider to perform an examination, and thus may limit your provider's ability to fully assess your condition.  Finally, though the technology is pretty good, we cannot assure that it will always work on either your or our end, and in the setting of a video visit, we may have to convert it to a phone-only visit.  In either situation, we cannot ensure that we have a secure connection.  Are you willing to proceed?"  2. Give patient instructions for WebEx download to smartphone as below if video visit  3. Advise patient to be prepared with any vital sign or heart rhythm information, their current medicines, and a piece of paper and pen handy for any instructions they may receive the day of their visit  4. Inform patient they will receive a phone call 15 minutes prior to their appointment time (may be from unknown caller ID) so they should be prepared to answer  5. Confirm that appointment type is correct in Epic appointment notes (video vs telephone)    TELEPHONE CALL NOTE  Charles [name]  Charles [name] has been deemed a candidate for a follow-up tele-health visit to limit community exposure during the Covid-19 pandemic. I spoke with the patient via phone to ensure availability of phone/video source, confirm preferred email & phone number, and discuss instructions and expectations.  I reminded Charles [name] to be prepared with any vital sign and/or heart rhythm information that could potentially be obtained via home monitoring, at the time of his visit. I reminded Charles [name] to expect a phone call at the time of his visit if his visit.  Did the patient verbally acknowledge consent to treatment?  YES  Charles Prete [name], RN    DOWNLOADING THE [place], go to App Store and type in WebEx in the search bar. [place] [employer], the blue/green circle. The app is free but as with any other app downloads, their phone may require them to verify saved payment information or Apple password. The patient does NOT have to create an account.  - If Android, ask patient to go to [employer] and type in WebEx in the search bar. [place] [employer], the blue/green circle. The app is free but as with any other app downloads, their phone may require them to verify saved payment information or Android password. The patient does NOT have to create an account.   CONSENT FOR TELE-HEALTH VISIT - PLEASE REVIEW  I hereby voluntarily request, consent and authorize [facility] and its employed or contracted physicians, physician assistants, nurse practitioners or other licensed health care professionals (the Practitioner), to provide me with telemedicine health care services (the "Services") as deemed necessary by the treating Practitioner. I acknowledge and consent to receive the Services by the Practitioner via telemedicine. I understand that the telemedicine visit will involve communicating with the Practitioner through live audiovisual communication technology and the disclosure of certain medical information by electronic transmission. I acknowledge that I have been given the opportunity to request an in-person assessment or other available alternative prior to the telemedicine visit and am voluntarily participating in the telemedicine visit.  I understand that I have the right to withhold or withdraw my consent to the use of telemedicine in the course of my care at any time, without affecting my right to future care or treatment, and that the Practitioner or I may terminate the telemedicine visit at any time. I understand that I have the right to inspect  all information obtained and/or recorded in the course of the telemedicine visit and may receive copies of available information for a reasonable fee.  I understand that some of the potential risks of receiving the Services via telemedicine include:  [name] Delay or interruption in medical evaluation due to technological equipment failure or disruption; . Information transmitted may not be sufficient (e.g. poor resolution of images) to allow for appropriate medical decision making by the Practitioner; and/or  . In rare instances, security protocols could fail, causing a breach of personal health information.  Furthermore, I acknowledge that it is my responsibility to provide information about my medical history, conditions and care that is complete and accurate to the best of my ability. I acknowledge that Practitioner's advice, recommendations, and/or decision may be based on factors not within their control, such as incomplete or inaccurate data provided by me or distortions of diagnostic images or specimens that may result from electronic transmissions. I understand that the practice of medicine is not an exact science and that Practitioner makes no  warranties or guarantees regarding treatment outcomes. I acknowledge that I will receive a copy of this consent concurrently upon execution via email to the email address I last provided but may also request a printed copy by calling the office of [place].    I understand that my insurance will be billed for this visit.   I have read or had this consent read to me. . I understand the contents of this consent, which adequately explains the benefits and risks of the Services being provided via telemedicine.  . I have been provided ample opportunity to ask questions regarding this consent and the Services and have had my questions answered to my satisfaction. . I give my informed consent for the services to be provided through the use of telemedicine in my  medical care  By participating in this telemedicine visit I agree to the above.

## 2019-01-02 NOTE — Telephone Encounter (Signed)
Spoke with pt daughter-in-law, Maureen Duesing. Reviewed info on AVS and informed that copy would be sent in the mail.

## 2019-01-02 NOTE — Telephone Encounter (Signed)
Called pt to review AVS and check receipt of AVS via mychart. LMTCB

## 2019-01-02 NOTE — Progress Notes (Signed)
Virtual Visit via Video Note    Evaluation Performed:  Follow-up visit  This visit type was conducted due to national recommendations for restrictions regarding the COVID-19 Pandemic (e.g. social distancing).  This format is felt to be most appropriate for this patient at this time.  All issues noted in this document were discussed and addressed.  No physical exam was performed (except for noted visual exam findings with Video Visits).  Please refer to the patient's chart (MyChart message for video visits and phone note for telephone visits) for the patient's consent to telehealth for [facility].  Date:  01/02/2019   ID:  Charles [name], DOB July 27, 1932, MRN [id number]  Patient Location:  His home  Provider location:   My home  PCP:  [name], MD  Cardiologist:  [name], MD  Electrophysiologist:  None   Chief Complaint: First postop visit from recent hospitalization for diastolic heart failure  History of Present Illness:    Charles [name] is a 85 y.o. male who presents via audio/video conferencing for a telehealth visit today.    The patient does not have symptoms concerning for COVID-19 infection (fever, chills, cough, or new shortness of breath).   Charles [name] is a 85 y.o.  mildly overweight, married Caucasian male whose wife was also a patient of mine. She unfortunately passed away Jan 10, 2018. He currently lives with one of his sons and appears to be fairly depressed. He was married for 51 years.He is a father of 11, grandfather to 4 grandchildren. I last saw him in the office1/28/2020. He is accompanied by his son [name] today.[name] He has a history of CAD status post coronary artery bypass grafting x4 June 2004 with a LIMA to his LAD; a vein to the right coronary artery, obtuse marginal branch and ramus branch. He also was found to have 70% bilateral renal artery stenosis which we have been following by duplex ultrasound. He has had left carotid endarterectomy  which we follow as well. His other problems include hypertension, hyperlipidemia and noninsulin-requiring diabetes. He is asymptomatic. His last functional study performed 2/14 was nonischemic. he denies chest pain or shortness of breath. He had new onset atrial flutter back in the fall and underwent TEE guided DC cardioversion by Dr. Debara Pickett 06/25/15 successfully to sinus rhythm. He was placed on Eliquis oral anticoagulation but has ultimately decided not to take this for various reasons.recent renal Dopplers have suggested progression of disease bilaterally left greater than right. His blood pressures have been more difficult to control as well. In addition, his echo performed 06/13/15 was significant for moderate to severe aortic stenosis with a valve area of 0.9 cm2 and a peak gradient of 49 mmHg. since I saw him in the office 5 months ago he's noticed increasing shortness of breath and fatigue over the last 3 weeks. He saw his PCP who did an EKG demonstrating recurrent atrial flutter with slow ventricular response.He underwent successful outpatient TEE guided cardioversion by Dr. Oval Linsey on 11/02/16 back to sinus rhythm with one shock. He remains on oral anticoagulation. He is currently improved and is currently asymptomatic.  He was evaluated by Dr. Burt Knack for T aVR 10/14/17 and underwent right and left heart cath by Dr. Angelena Form a week later revealing patent grafts with at most moderate aortic stenosis.  When I saw him back in the office at the end of January 2020 he was complaining of progressive dyspnea on exertion most likely related to his severe aortic stenosis. He also  had a permanent pacemaker placed by Dr. Caryl Comes 04/21/2018.  We did rediscuss the possibility of TAVR which he ultimately decided to pursue.  He saw Dr. Burt Knack in the office for further evaluation.  He ultimately underwent TAVR by Drs. Cooper and Harmon on 12/05/2018 with an Oletta Lamas life science 26 mm bioprosthetic valve without  complication.  He was discharged home on 12/07/2018.  He was readmitted on 12/25/2018 with volume overload and underwent diuresis.  He was sent home 2 days later on oral torsemide.  He is weighed every day at home and his weight is remained stable.  He has had no recurrence of his peripheral edema and denies chest pain or shortness of breath.  He is on Eliquis oral anticoagulation.   Prior CV studies:   The following studies were reviewed today:  2D echocardiogram performed during his recent hospitalization on 12/26/2018  Past Medical History:  Diagnosis Date   Abdominal aortic atherosclerosis ([place])    by xray   Arthritis    in lower back (06/24/2015)   Atrial flutter ([place])    notes 06/24/2015   AV block, [place] 1 09/06/2017   Archie Endo 09/06/2017   BRVO (branch retinal vein occlusion) 2015   bilateral [facility])   CAD (coronary artery disease) 2004   a. s/p CABG in 2004 with LIMA-LAD, SVG-RCA, SVG-OM, and SVG-RI   Carotid stenosis 1999   s/p L CEA   Chronic diastolic CHF (congestive heart failure) ([place])    Colon polyp 2005   (Dr. 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   Edwards Sapien 3 THV (size 26 mm, model # 9600TFX, serial # [id number]) via the TF approach   Severe aortic stenosis    T2DM (type 2 diabetes mellitus) ([place]) 1995   Urge incontinence of urine    Past Surgical History:  Procedure Laterality Date   CARDIAC  CATHETERIZATION  03/06/2003   No intervention - recommend CABG   CARDIOVASCULAR STRESS TEST  11/2010   normal perfusion, no evidence of ischemia, EF 62% post exercise   CARDIOVASCULAR STRESS TEST  11/28/2012   Mild diaphragmatic attenuation; cannot exclude a focal region of nontransmural inferior scar   CARDIOVERSION N/A 06/25/2015   Procedure: CARDIOVERSION;  Surgeon: Pixie Casino, MD;  Location: [facility];  Service: Cardiovascular;  Laterality: N/A;   CARDIOVERSION N/A 11/02/2016   Procedure: CARDIOVERSION;  Surgeon: [name], MD;  Location: [place];  Service: Cardiovascular;  Laterality: N/A;   CAROTID ENDARTERECTOMY Left 1999   ([place])   CATARACT EXTRACTION W/ INTRAOCULAR LENS  IMPLANT, BILATERAL Bilateral 01/2013   [place]   COLONOSCOPY  2003   colon polyp x3 - adenomatous Tiffany Kocher)   COLONOSCOPY  10/08/2012   2 TA, diverticulosis, int hem, no rpt rec Tiffany Kocher)   CORONARY ANGIOPLASTY     CORONARY ARTERY BYPASS GRAFT  03/07/2003   4v CABG ([place]) with LIMA to  LAD, vein graft to RCA, 1st obtuse marginal, and ramus intermedius   ESOPHAGOGASTRODUODENOSCOPY  10/08/2012   nl esophagus, duodenitis and erosive gastropathy, path - gastropathy no Hpylori, no rpt rec   LUMBAR EPIDURAL INJECTION  03/2017   L5/S1 ([place])   PACEMAKER IMPLANT N/A 04/21/2018   Procedure: PACEMAKER IMPLANT -- Dual Chamber;  Surgeon: Deboraha Sprang, MD;  Location: [place] [facility];  Service: Cardiovascular;  Laterality: N/A;   PERIPHERAL VASCULAR CATHETERIZATION N/A 02/02/2016   Procedure: Renal Angiography;  Surgeon: [name], MD;  Location: [facility];  Service: Cardiovascular;  Laterality: N/A;   PERIPHERAL VASCULAR CATHETERIZATION N/A 02/02/2016   Procedure: Abdominal Aortogram;  Surgeon: [name], MD;  Location: [place] [facility];  Service: Cardiovascular;  Laterality: N/A;   RENAL DOPPLER  11/29/2011   Celiac&SMA-demonstrated vessel narrowing suggestive of a  greater than 50% diameter reduction. Bilateral renal arteries-demonstrated vessel narrowing of 60-99% diameter reduction. Rt Kidney-mid pole lateral simple cyst noted measuring 1.29x0.76x1.11cm and exophytic cyst outside lower pole measuring 1.23x0.96x1.31. Lft Kidney-lateral mid to lower pole simple cyst measuering-1.24x9.83x1.24   RIGHT/LEFT HEART CATH AND CORONARY/GRAFT ANGIOGRAPHY N/A 10/20/2017   Procedure: RIGHT/LEFT HEART CATH AND CORONARY/GRAFT ANGIOGRAPHY;  Surgeon: Burnell Blanks, MD;  Location: [place] [facility];  Service: Cardiovascular;  Laterality: N/A;   TEE WITHOUT CARDIOVERSION N/A 06/25/2015   Procedure: TRANSESOPHAGEAL ECHOCARDIOGRAM (TEE);  Surgeon: Pixie Casino, MD;  Location: [facility];  Service: Cardiovascular;  Laterality: N/A;   TEE WITHOUT CARDIOVERSION N/A 11/02/2016   Procedure: TRANSESOPHAGEAL ECHOCARDIOGRAM (TEE);  Surgeon: [name], MD;  Location: [place];  Service: Cardiovascular;  Laterality: N/A;   TEE WITHOUT CARDIOVERSION N/A 12/05/2018   Procedure: TRANSESOPHAGEAL ECHOCARDIOGRAM (TEE);  Surgeon: Sherren Mocha, MD;  Location: [place];  Service: Open Heart Surgery;  Laterality: N/A;   TONSILLECTOMY     TRANSCATHETER AORTIC VALVE REPLACEMENT, TRANSFEMORAL  12/2018   [place]   TRANSCATHETER AORTIC VALVE REPLACEMENT, TRANSFEMORAL N/A 12/05/2018   Procedure: TRANSCATHETER AORTIC VALVE REPLACEMENT, TRANSFEMORAL;  Surgeon: Sherren Mocha, MD;  Location: [place];  Service: Open Heart Surgery;  Laterality: N/A;   UPPER GASTROINTESTINAL ENDOSCOPY  2003   reflux esophagitis, erosive gastropathy, duodenal ulcer     Current Meds  Medication Sig   acetaminophen (TYLENOL) 500 MG tablet Take 1 tablet (500 mg total) by mouth 3 (three) times daily.   aspirin 81 MG chewable tablet Chew 1 tablet (81 mg total) by mouth daily.   atorvastatin (LIPITOR) 20 MG tablet Take 1 tablet (20 mg total) by mouth daily.   citalopram (CELEXA) 20 MG tablet Take 1  tablet (20 mg total) by mouth daily.   ELIQUIS 5 MG TABS tablet Take 1 tablet (5 mg total) by mouth 2 (two) times daily. (Patient taking differently: Take 5 mg by mouth 2 (two) times daily. )   finasteride (PROSCAR) 5 MG tablet Take 1 tablet (5 mg total) by mouth daily.   guaiFENesin (MUCINEX) 600 MG 12 hr tablet Take 600 mg by mouth 2 (two) times daily as needed for cough or to loosen phlegm.   insulin glargine (LANTUS) 100 UNIT/ML injection Inject 20 Units into the skin at bedtime. Started using 20 units twice daily to control BS   lisinopril (PRINIVIL,ZESTRIL) 40 MG tablet Take 1 tablet (40 mg total) by mouth daily.   metFORMIN (GLUCOPHAGE) 1000 MG tablet Take 1 tablet (1,000 mg total) by mouth 2 (two) times daily with a meal.   nitroGLYCERIN (NITROSTAT) 0.4 MG  SL tablet Place 1 tablet (0.4 mg total) under the tongue every 5 (five) minutes as needed for chest pain.   pantoprazole (PROTONIX) 20 MG tablet Take 1 tablet (20 mg total) by mouth daily. (Patient taking differently: Take 20 mg by mouth daily. )   Potassium Chloride ER 20 MEQ TBCR Take 20 mEq by mouth every Monday, Wednesday, Friday, Saturday, and Sunday at 6 PM.   tamsulosin (FLOMAX) 0.4 MG CAPS capsule Take 1 capsule (0.4 mg total) by mouth daily after supper.   torsemide (DEMADEX) 20 MG tablet Take 1 tablet (20 mg total) by mouth 2 (two) times daily.   traZODone (DESYREL) 50 MG tablet Take 50 mg by mouth at bedtime.     Allergies:   Gabapentin; Metoprolol; Spironolactone; Amlodipine; Other; Rosiglitazone maleate; and Tricor [fenofibrate]   Social History   Tobacco Use   Smoking status: Former Smoker    Packs/day: 0.50    Years: 36.00    Pack years: 18.00    Types: Cigarettes    Last attempt to quit: 07/23/2013    Years since quitting: 5.4   Smokeless tobacco: Never Used  Substance Use Topics   Alcohol use: No   Drug use: No     Family Hx: The patient's family history includes Cancer in his brother,  brother, maternal grandmother, and mother; Diabetes in his sister; Heart attack in his maternal grandfather, paternal grandfather, and paternal grandmother; Heart disease in his father and sister; Hypertension in his father and sister; Lung disease in his brother; Stroke in his sister.  ROS:   Please see the history of present illness.     All other systems reviewed and are negative.   Labs/Other Tests and Data Reviewed:    Recent Labs: 03/08/2018: NT-Pro BNP 843 03/25/2018: TSH 1.604 12/01/2018: ALT 18 12/06/2018: Magnesium 1.6 12/25/2018: B Natriuretic Peptide 312.8; Hemoglobin 12.2; Platelets 179 12/27/2018: BUN 24; Creatinine, Ser 1.23; Potassium 3.9; Sodium 134   Recent Lipid Panel Lab Results  Component Value Date/Time   CHOL 131 06/08/2016 02:23 PM   TRIG 95.0 06/08/2016 02:23 PM   TRIG 133 09/10/2011   HDL 39.50 06/08/2016 02:23 PM   CHOLHDL 3 06/08/2016 02:23 PM   LDLCALC 73 06/08/2016 02:23 PM   LDLDIRECT 77 09/10/2011    Wt Readings from Last 3 Encounters:  01/02/19 213 lb 12.8 oz (97 kg)  12/27/18 213 lb 10 oz (96.9 kg)  12/14/18 226 lb 6.4 oz (102.7 kg)     Objective:    Vital Signs:  BP (!) 144/91    Pulse (!) 101 Comment: DAUGHTER-IN-LAW SAYS TABLET FROM [employer] MAY BE OF   Wt 213 lb 12.8 oz (97 kg)    BMI 28.21 kg/m    A physical exam was not performed today since this with was a virtual visit.  ASSESSMENT & PLAN:    1.  Severe aortic stenosis- history of symptomatic severe left ear status post TAVR on 12/05/2018 by Drs. Cooper and McLoud with an Oletta Lamas life science 26 mm valve.  This was an uncomplicated procedure and the patient was discharged 2 days later.  He was symptomatically improved at the time of discharge.  2.  Hyperlipidemia- history of hyperlipidemia on statin therapy followed by his PCP  3.  History of essential hypertension with blood pressure measured at his house today of 144/91.  He is on lisinopril.  4.  Coronary artery  disease-history of CAD status post CABG x4 in June 2004 with a LIMA to his  LAD, vein to the RCA, obtuse marginal branch and ramus branch.  He had a heart cath performed by Dr. Angelena Form 10/14/2017 for evaluation of TAVR revealing patent grafts with moderate aortic stenosis he denies chest pain  5: History of atrial flutter status post cardioversion 11/2014 and 2018 with ultimate implantation of a permanent transvenous pacemaker 5 by Dr. Caryl Comes.  He is on Eliquis.  6: Acute on chronic diastolic heart failure- Mr. Scicchitano was admitted on 1/70/0174 with diastolic heart failure.  He was diuresed and sent home 2 days later with improved renal function, and clinical improvement as well.  He remains on torsemide p.o. daily.  He follows his daily weights and limits his salt intake.  COVID-19 Education: The signs and symptoms of COVID-19 were discussed with the patient and how to seek care for testing (follow up with PCP or arrange E-visit).  The importance of social distancing was discussed today.  Patient Risk:   After full review of this patient's clinical status, I feel that they are at least moderate risk at this time.  Time:   Today, I have spent 20 minutes with the patient with telehealth technology discussing his recent TAVR procedure, diastolic heart failure, diuresis, salt avoidance..     Medication Adjustments/Labs and Tests Ordered: Current medicines are reviewed at length with the patient today.  Concerns regarding medicines are outlined above.  Tests Ordered: No orders of the defined types were placed in this encounter.  Medication Changes: No orders of the defined types were placed in this encounter.   Disposition:  Follow up in 3 month(s) with an APP and 6 months with me  Signed, [name], MD  01/02/2019 1:15 PM    [facility]

## 2019-01-02 NOTE — Patient Instructions (Signed)
Medication Instructions:  Your physician recommends that you continue on your current medications as directed. Please refer to the Current Medication list given to you today.  If you need a refill on your cardiac medications before your next appointment, please call your pharmacy.   Lab work: NONE If you have labs (blood work) drawn today and your tests are completely normal, you will receive your results only by: [name] MyChart Message (if you have MyChart) OR . A paper copy in the mail If you have any lab test that is abnormal or we need to change your treatment, we will call you to review the results.  Testing/Procedures: Your physician has requested that you have an echocardiogram. Echocardiography is a painless test that uses sound waves to create images of your heart. It provides your doctor with information about the size and shape of your heart and how well your heart's chambers and valves are working. This procedure takes approximately one hour. There are no restrictions for this procedure.    Follow-Up: At [facility], you and your health needs are our priority.  As part of our continuing mission to provide you with exceptional heart care, we have created designated Provider Care Teams.  These Care Teams include your primary Cardiologist (physician) and Advanced Practice Providers (APPs -  Physician Assistants and Nurse Practitioners) who all work together to provide you with the care you need, when you need it. . You will need a follow up appointment in 3 months with an APP and in 6 months with Dr. [name].  Please call our office 2 months in advance to schedule each appointment.  You may see one of the following Advanced Practice Providers on your designated Care Team:   . Kerin Ransom, [place] . Almyra Deforest, PA-C . Fabian Sharp, PA-C . Jory Sims, DNP . Rosaria Ferries, PA-C . Roby Lofts, PA-C . Sande Rives, PA-C

## 2019-01-02 NOTE — Telephone Encounter (Signed)
Spoke with pt daughter-in-law who states she will be assisting the pt with video visit and will report to pt home around 12:30. Informed her that cardiac questionnaire and consent would be reviewed when she is with pt. Provided her with meeting number and password. She verbalized understanding

## 2019-01-03 NOTE — Progress Notes (Signed)
Patient has NYHA class II symptoms of shortness of breath and fatigue. He is overall doing much better than prior to his readmission and before his TAVR.

## 2019-01-08 ENCOUNTER — Encounter: Payer: Self-pay | Admitting: Surgery

## 2019-01-08 ENCOUNTER — Encounter (HOSPITAL_COMMUNITY): Payer: Self-pay | Admitting: Neurology

## 2019-01-09 ENCOUNTER — Other Ambulatory Visit: Payer: Self-pay | Admitting: Family Medicine

## 2019-01-09 NOTE — Telephone Encounter (Signed)
Electronic refill request Eliquis Last office visit 08/01/18 Last refill 09/20/18 #60/3

## 2019-01-10 ENCOUNTER — Other Ambulatory Visit: Payer: Self-pay | Admitting: Family Medicine

## 2019-01-10 NOTE — Telephone Encounter (Signed)
Eliquis Last filled:  12/13/18, #60 Last OV:  08/01/18, f/u Next OV:  02/22/19, CPE Pt 2

## 2019-01-11 ENCOUNTER — Encounter: Payer: Self-pay | Admitting: Family Medicine

## 2019-01-11 ENCOUNTER — Ambulatory Visit: Payer: Medicare Other | Admitting: Physician Assistant

## 2019-01-11 ENCOUNTER — Other Ambulatory Visit (HOSPITAL_COMMUNITY): Payer: Medicare Other

## 2019-01-15 ENCOUNTER — Other Ambulatory Visit: Payer: Self-pay | Admitting: Family Medicine

## 2019-01-17 NOTE — Telephone Encounter (Signed)
Trazadone Last filled:  10/19/18, #180 Last OV:  08/01/18, f/u Next OV:  02/22/19, CPE Pt 2

## 2019-01-25 ENCOUNTER — Other Ambulatory Visit: Payer: Self-pay

## 2019-01-25 ENCOUNTER — Telehealth: Payer: Self-pay | Admitting: Cardiovascular Disease

## 2019-01-25 DIAGNOSIS — Z952 Presence of prosthetic heart valve: Secondary | ICD-10-CM

## 2019-01-25 DIAGNOSIS — I5022 Chronic systolic (congestive) heart failure: Secondary | ICD-10-CM

## 2019-01-25 DIAGNOSIS — I2510 Atherosclerotic heart disease of native coronary artery without angina pectoris: Secondary | ICD-10-CM

## 2019-01-25 DIAGNOSIS — Z79899 Other long term (current) drug therapy: Secondary | ICD-10-CM

## 2019-01-25 DIAGNOSIS — I441 Atrioventricular block, second degree: Secondary | ICD-10-CM

## 2019-01-25 NOTE — Telephone Encounter (Signed)
Spoke with pt's daughter in law who states pt reports for the last 3 night he has been feeling SOB, to where it wakes him up out of his sleep. Daughter state pt said he feels ok during the day time and only swelling is in his left wrist. Daughter in law doesn't know if pt's weight has increased but says she will call back as she is heading to pts house later today. Will route to MD for recommendations.

## 2019-01-25 NOTE — Progress Notes (Signed)
CXR prior to appt with Dr. Johnsie Cancel STAT 01/26/19

## 2019-01-25 NOTE — Telephone Encounter (Signed)
Dr. [name] not DOD until May 26.[name] Per Dr. Percival Spanish DOD today will check with Dr. Ellamae Sia to see if can see pt Friday 01/26/19 in [facility] office.

## 2019-01-25 NOTE — Telephone Encounter (Signed)
That's fine needs CXR and lab work BMET, Hct/Hb BNP can put on schedule tomorrow if afternoon get these before if we see him in am do latter

## 2019-01-25 NOTE — Telephone Encounter (Signed)
Pt c/o Shortness Of Breath: STAT if SOB developed within the last 24 hours or pt is noticeably SOB on the phone  1. Are you currently SOB (can you hear that pt is SOB on the phone)? NO   2. How long have you been experiencing SOB? 3 DAYS  3. Are you SOB when sitting or when up moving around? WHEN ASLEEP IT WAKES HIM UP  4. Are you currently experiencing any other symptoms? L wrist slightly swollen can not put on watch.. Question is he a candidate for O2 ?

## 2019-01-25 NOTE — Telephone Encounter (Signed)
Mizner probably needs to be seen in the office when I'm DOD

## 2019-01-25 NOTE — Telephone Encounter (Signed)
Pt to see Dr.Nishan DOD at [facility] instead on 01/26/19 with CXR in the morning.

## 2019-01-25 NOTE — Telephone Encounter (Signed)
Spoke with the pt daughter and pt will see Dr. Debara Pickett Monday 01/29/19 and will see if Dr. [name] can advise pt on taking a little extra Torsemide over the weekend until his Monday appt.

## 2019-01-26 ENCOUNTER — Other Ambulatory Visit: Payer: Self-pay | Admitting: Family Medicine

## 2019-01-26 ENCOUNTER — Telehealth: Payer: Self-pay

## 2019-01-26 ENCOUNTER — Encounter: Payer: Self-pay | Admitting: Cardiovascular Disease

## 2019-01-26 ENCOUNTER — Ambulatory Visit
Admission: RE | Admit: 2019-01-26 | Discharge: 2019-01-26 | Disposition: A | Discharge status: Home / Self Care | Payer: Medicare Other | Source: Ambulatory Visit | Attending: Cardiovascular Disease | Admitting: Cardiovascular Disease

## 2019-01-26 ENCOUNTER — Other Ambulatory Visit: Payer: Self-pay | Admitting: *Deleted

## 2019-01-26 ENCOUNTER — Other Ambulatory Visit: Payer: Self-pay

## 2019-01-26 ENCOUNTER — Ambulatory Visit: Payer: Medicare Other | Admitting: Cardiovascular Disease

## 2019-01-26 VITALS — BP 124/64 | HR 87 | Ht 73.0 in | Wt 224.8 lb

## 2019-01-26 DIAGNOSIS — Z952 Presence of prosthetic heart valve: Secondary | ICD-10-CM

## 2019-01-26 DIAGNOSIS — I2510 Atherosclerotic heart disease of native coronary artery without angina pectoris: Secondary | ICD-10-CM

## 2019-01-26 DIAGNOSIS — I441 Atrioventricular block, second degree: Secondary | ICD-10-CM | POA: Diagnosis not present

## 2019-01-26 DIAGNOSIS — J9811 Atelectasis: Secondary | ICD-10-CM | POA: Diagnosis not present

## 2019-01-26 DIAGNOSIS — Z79899 Other long term (current) drug therapy: Secondary | ICD-10-CM

## 2019-01-26 DIAGNOSIS — I5022 Chronic systolic (congestive) heart failure: Secondary | ICD-10-CM

## 2019-01-26 DIAGNOSIS — R0602 Shortness of breath: Secondary | ICD-10-CM

## 2019-01-26 NOTE — Telephone Encounter (Signed)
LM for Charles [name].. pt to go to [place] for CXR this morning.   This info was obtained evening of 01/25/19         COVID-19 Pre-Screening Questions:  . Do you currently have a fever? NO (yes = cancel and refer to pcp for e-visit) . Have you recently travelled on a cruise, internationally, or to [place], [place], [place], [place], [place], or [place], [place] [name], stay home, monitor symptoms, and contact pcp or initiate e-visit if symptoms develop) . Have you been in contact with someone that is currently pending confirmation of Covid19 testing or has been confirmed to have the [place] virus?  NO (yes = cancel, stay home, away from tested individual, monitor symptoms, and contact pcp or initiate e-visit if symptoms develop) . Are you currently experiencing fatigue or cough? NO (yes = pt should be prepared to have a mask placed at the time of their visit).

## 2019-01-26 NOTE — Progress Notes (Signed)
CARDIOLOGY CONSULT NOTE       Patient ID: YUTA CIPOLLONE MRN: [id number] DOB/AGE: [birth year] 85 y.o.  Admit date: (Not on file) Referring Physician: [name]  Primary Physician: [name], MD Primary Cardiologist: [name] Reason for Consultation: Dyspnea  Active Problems:   * No active hospital problems. *   HPI:  85 y.o. added on to DOD schedule at the request of Dr [name]. History of CABG 2004 with LIMA to LAD, SVG RCA, OM and Ramus. PVD with RAS and left CEA. Non ischemic myovue 2014. Has had PAF with previous [facility]. SSS with pacer  March 3,2020 had TAVR with 26 mm Sapien 3. Readmitted with CHF on 12/25/18 Rx with torsemide. Post procedural echo showed mild PVL with mean gradient 5 mmHg. EF 50-55% on 12/26/18   CXR done today with scarring no acute disease no infiltrate, pneumonia or CHF   His weight is stable no cough or fever No evidence for COVID19 No sick contacts Compliant With meds. Subjective dyspnea over last week. Some edema and ? PND although he indicates ears being congested and mucinex helping symptoms Previous smoker quit many years ago   ROS All other systems reviewed and negative except as noted above  Past Medical History:  Diagnosis Date   Abdominal aortic atherosclerosis ([place])    by xray   Arthritis    in lower back (06/24/2015)   Atrial flutter ([place])    notes 06/24/2015   AV block, [place] 1 09/06/2017   Archie Endo 09/06/2017   BRVO (branch retinal vein occlusion) 2015   bilateral [facility])   CAD (coronary artery disease) 2004   a. s/p CABG in 2004 with LIMA-LAD, SVG-RCA, SVG-OM, and SVG-RI   Carotid stenosis 1999   s/p L CEA   Chronic diastolic CHF (congestive heart failure) ([place])    Colon polyp 2005   (Dr. 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   Edwards Sapien 3 THV (size 26 mm, model # 9600TFX, serial # [id number]) via the TF approach   Severe aortic stenosis    T2DM (type 2 diabetes mellitus) ([place]) 1995   Urge incontinence of urine     Family History  Problem Relation Age of Onset   Hypertension Father    Heart disease Father    Cancer Mother        GI cancer   Diabetes Sister    Heart disease Sister    Stroke Sister    Hypertension Sister    Cancer Brother        Lung cancer - nonsmoker   Cancer Maternal Grandmother    Heart attack Maternal Grandfather    Heart attack Paternal Grandmother    Heart attack Paternal Grandfather    Cancer Brother        Pancreatic cancer   Lung disease Brother     Social History   Socioeconomic History  Marital status: Widowed    Spouse name: Not on file   Number of children: Not on file   Years of education: Not on file   Highest education level: Not on file  Occupational History   Not on file  Social Needs   Financial resource strain: Not on file   Food insecurity:    Worry: Not on file    Inability: Not on file   Transportation needs:    Medical: Not on file    Non-medical: Not on file  Tobacco Use   Smoking status: Former Smoker    Packs/day: 0.50    Years: 36.00    Pack years: 18.00    Types: Cigarettes    Last attempt to quit: 07/23/2013    Years since quitting: 5.5   Smokeless tobacco: Never Used  Substance and Sexual Activity   Alcohol use: No   Drug use: No   Sexual activity: Not Currently  Lifestyle   Physical activity:    Days per week: Not on file    Minutes per  session: Not on file   Stress: Not on file  Relationships   Social connections:    Talks on phone: Not on file    Gets together: Not on file    Attends religious service: Not on file    Active member of club or organization: Not on file    Attends meetings of clubs or organizations: Not on file    Relationship status: Not on file   Intimate partner violence:    Fear of current or ex partner: Not on file    Emotionally abused: Not on file    Physically abused: Not on file    Forced sexual activity: Not on file  Other Topics Concern   Not on file  Social History Narrative   Caffeine: 4 cups coffee/day, some tea and soda   Lives with wife   Occupation: Retired   Activity: no regular exercise   Diet: good water, fruits/vegetables daily    Past Surgical History:  Procedure Laterality Date   CARDIAC CATHETERIZATION  03/06/2003   No intervention - recommend CABG   CARDIOVASCULAR STRESS TEST  11/2010   normal perfusion, no evidence of ischemia, EF 62% post exercise   CARDIOVASCULAR STRESS TEST  11/28/2012   Mild diaphragmatic attenuation; cannot exclude a focal region of nontransmural inferior scar   CARDIOVERSION N/A 06/25/2015   Procedure: CARDIOVERSION;  Surgeon: Pixie Casino, MD;  Location: [place];  Service: Cardiovascular;  Laterality: N/A;   CARDIOVERSION N/A 11/02/2016   Procedure: CARDIOVERSION;  Surgeon: [name], MD;  Location: [place];  Service: Cardiovascular;  Laterality: N/A;   CAROTID ENDARTERECTOMY Left 1999   ([place])   CATARACT EXTRACTION W/ INTRAOCULAR LENS  IMPLANT, BILATERAL Bilateral 01/2013   [place]   COLONOSCOPY  2003   colon polyp x3 - adenomatous Tiffany Kocher)   COLONOSCOPY  10/08/2012   2 TA, diverticulosis, int hem, no rpt rec Tiffany Kocher)   CORONARY ANGIOPLASTY     CORONARY ARTERY BYPASS GRAFT  03/07/2003   4v CABG ([place]) with LIMA to LAD, vein graft to RCA, 1st obtuse marginal, and ramus intermedius   ESOPHAGOGASTRODUODENOSCOPY   10/08/2012   nl esophagus, duodenitis and erosive gastropathy, path - gastropathy no Hpylori, no rpt rec   LUMBAR EPIDURAL INJECTION  03/2017   L5/S1 ([place])   PACEMAKER IMPLANT N/A 04/21/2018   Procedure: PACEMAKER IMPLANT -- Dual Chamber;  Surgeon: Deboraha Sprang, MD;  Location: [facility] INVASIVE CV  LAB;  Service: Cardiovascular;  Laterality: N/A;   PERIPHERAL VASCULAR CATHETERIZATION N/A 02/02/2016   Procedure: Renal Angiography;  Surgeon: [name], MD;  Location: [place] CV LAB;  Service: Cardiovascular;  Laterality: N/A;   PERIPHERAL VASCULAR CATHETERIZATION N/A 02/02/2016   Procedure: Abdominal Aortogram;  Surgeon: [name], MD;  Location: [place] CV LAB;  Service: Cardiovascular;  Laterality: N/A;   RENAL DOPPLER  11/29/2011   Celiac&SMA-demonstrated vessel narrowing suggestive of a greater than 50% diameter reduction. Bilateral renal arteries-demonstrated vessel narrowing of 60-99% diameter reduction. Rt Kidney-mid pole lateral simple cyst noted measuring 1.29x0.76x1.11cm and exophytic cyst outside lower pole measuring 1.23x0.96x1.31. Lft Kidney-lateral mid to lower pole simple cyst measuering-1.24x9.83x1.24   RIGHT/LEFT HEART CATH AND CORONARY/GRAFT ANGIOGRAPHY N/A 10/20/2017   Procedure: RIGHT/LEFT HEART CATH AND CORONARY/GRAFT ANGIOGRAPHY;  Surgeon: Burnell Blanks, MD;  Location: [place] CV LAB;  Service: Cardiovascular;  Laterality: N/A;   TEE WITHOUT CARDIOVERSION N/A 06/25/2015   Procedure: TRANSESOPHAGEAL ECHOCARDIOGRAM (TEE);  Surgeon: Pixie Casino, MD;  Location: [facility] ENDOSCOPY;  Service: Cardiovascular;  Laterality: N/A;   TEE WITHOUT CARDIOVERSION N/A 11/02/2016   Procedure: TRANSESOPHAGEAL ECHOCARDIOGRAM (TEE);  Surgeon: [name], MD;  Location: [place];  Service: Cardiovascular;  Laterality: N/A;   TEE WITHOUT CARDIOVERSION N/A 12/05/2018   Procedure: TRANSESOPHAGEAL ECHOCARDIOGRAM (TEE);  Surgeon: Sherren Mocha, MD;  Location: [place];  Service: Open Heart Surgery;  Laterality: N/A;   TONSILLECTOMY     TRANSCATHETER AORTIC VALVE REPLACEMENT, TRANSFEMORAL N/A 12/05/2018   Procedure: TRANSCATHETER AORTIC VALVE REPLACEMENT, TRANSFEMORAL;  Surgeon: Sherren Mocha, MD;  Location: [place];  Service: Open Heart Surgery;  Laterality: N/A;   UPPER GASTROINTESTINAL ENDOSCOPY  2003   reflux esophagitis, erosive gastropathy, duodenal ulcer        Physical Exam: Blood pressure 124/64, pulse 87, height 6\' 1"  (1.854 m), weight 102 kg, SpO2 92 %.   Affect appropriate Chronically ill overweight white male  HEENT: normal Neck supple with no adenopathy JVP normal no bruits no thyromegaly Lungs clear with no wheezing and good diaphragmatic motion Heart:  S1/S2 SEM through TAVR valve no AR murmur no rub, gallop or click PMI normal pacer under left clavicle  Abdomen: benighn, BS positve, no tenderness, no AAA no bruit.  No HSM or HJR Distal pulses intact with no bruits Plus one bilateral edema with psoriatic changes on knees  Neuro non-focal Skin warm and dry LUE hand more swollen from recent hospitalization ? Iv stick  No muscular weakness   Labs:   Lab Results  Component Value Date   WBC 6.2 12/25/2018   HGB 12.2 (L) 12/25/2018   HCT 36.0 (L) 12/25/2018   MCV 92.6 12/25/2018   PLT 179 12/25/2018   No results for input(s): NA, K, CL, CO2, BUN, CREATININE, CALCIUM, PROT, BILITOT, ALKPHOS, ALT, AST, GLUCOSE in the last 168 hours.  Invalid input(s): LABALBU Lab Results  Component Value Date   TROPONINI 0.06 ([place]) 03/26/2018    Lab Results  Component Value Date   CHOL 131 06/08/2016   CHOL 120 06/25/2015   CHOL 148 10/08/2014   Lab Results  Component Value Date   HDL 39.50 06/08/2016   HDL 32 (L) 06/25/2015   HDL 31.40 (L) 10/08/2014   Lab Results  Component Value Date   LDLCALC 73 06/08/2016   LDLCALC 69 06/25/2015   [place] 85 10/08/2014   Lab Results  Component Value Date   TRIG 95.0 06/08/2016    TRIG 95 06/25/2015  TRIG 156.0 (H) 10/08/2014   Lab Results  Component Value Date   CHOLHDL 3 06/08/2016   CHOLHDL 3.8 06/25/2015   CHOLHDL 5 10/08/2014   Lab Results  Component Value Date   LDLDIRECT 77 09/10/2011      Radiology: Dg Chest 2 View  Result Date: 01/26/2019 CLINICAL DATA:  Shortness of breath EXAM: CHEST - 2 VIEW COMPARISON:  December 25, 2018 and April 22, 2018 FINDINGS: There is scarring with atelectasis in the left base region. The lungs elsewhere are clear. Heart size and pulmonary vascularity are normal. No adenopathy. Pacemaker lead tips are in the right atrium and right ventricle regions. Patient is status post coronary artery bypass grafting and aortic valve replacement. There is aortic atherosclerosis. No pneumothorax. There is degenerative change in the thoracic spine. IMPRESSION: Scarring and atelectasis in the left base. Lungs elsewhere clear. Stable cardiac silhouette. Stable pacemaker lead positioning. Aortic Atherosclerosis (ICD10-I70.0). Electronically Signed   By: Lowella Grip III M.D.   On: 01/26/2019 14:00    EKG: Paced rate 75 LBBB 12/26/18   ASSESSMENT AND PLAN:   Dyspnea:  Etiology not clear labs including Hct and BNP pending CXR shows NAD No loud murmur on exam to necessitate another echo He does indicate that mucinex helps his symptoms at night and with previous smoking he may have some asthmatic bronchitis TAVR: low mean gradient mild PVL f/u echo in 6 months SBE CAD: Distant CABG grafts patent on pre TAVR cath 10/20/17 PAF/SSS:  In NSR today a sensed V pacing normal function   Will see what lab work shows Continue current dose of diuretic and meds f/u with Dr [name] next DOD day in person   Time spent reviewing chart, exam CXR and discussing with daughter 40 minutes   Signed: Jenkins Rouge 01/26/2019, 3:23 PM

## 2019-01-26 NOTE — Patient Instructions (Addendum)
Medication Instructions:   If you need a refill on your cardiac medications before your next appointment, please call your pharmacy.   Lab work: Your physician recommends that you have lab work today. BMET, Hct/Hb, BNP  If you have labs (blood work) drawn today and your tests are completely normal, you will receive your results only by: [name] MyChart Message (if you have MyChart) OR . A paper copy in the mail If you have any lab test that is abnormal or we need to change your treatment, we will call you to review the results.  Testing/Procedures: None ordered today.  Follow-Up: At [facility], you and your health needs are our priority.  As part of our continuing mission to provide you with exceptional heart care, we have created designated Provider Care Teams.  These Care Teams include your primary Cardiologist (physician) and Advanced Practice Providers (APPs -  Physician Assistants and Nurse Practitioners) who all work together to provide you with the care you need, when you need it. You will need a follow up appointment  On  May 26th with Dr. [name] in person at [facility] Dr. [name] is DOD that day.

## 2019-01-26 NOTE — Telephone Encounter (Signed)
Called pts daughter [name] since I noticed the pt has still not had his CXR and she says she is taking him on the way to the appt with Dr. Johnsie Cancel... I stressed to her last night to go this morning so Dr. Johnsie Cancel would have the report for the OV but she says she could not get there any earlier. CXR was ordered STAT.

## 2019-01-27 LAB — BASIC METABOLIC PANEL
BUN/Creatinine Ratio: 21 (ref 10–24)
BUN: 29 mg/dL — ABNORMAL HIGH (ref 8–27)
CO2: 23 mmol/L (ref 20–29)
Calcium: 9.8 mg/dL (ref 8.6–10.2)
Chloride: 96 mmol/L (ref 96–106)
Creatinine, Ser: 1.36 mg/dL — ABNORMAL HIGH (ref 0.76–1.27)
GFR calc Af Amer: 55 mL/min/{1.73_m2} — ABNORMAL LOW (ref >59)
GFR calc non Af Amer: 47 mL/min/{1.73_m2} — ABNORMAL LOW (ref >59)
Glucose: 208 mg/dL — ABNORMAL HIGH (ref 65–99)
Potassium: 4.8 mmol/L (ref 3.5–5.2)
Sodium: 137 mmol/L (ref 134–144)

## 2019-01-27 LAB — HEMOGLOBIN AND HEMATOCRIT, BLOOD
Hematocrit: 37.8 % (ref 37.5–51.0)
Hemoglobin: 12.3 g/dL — ABNORMAL LOW (ref 13.0–17.7)

## 2019-01-27 LAB — SPECIMEN STATUS REPORT

## 2019-01-27 LAB — PRO B NATRIURETIC PEPTIDE: NT-Pro BNP: 1162 pg/mL — ABNORMAL HIGH (ref 0–486)

## 2019-01-29 ENCOUNTER — Ambulatory Visit: Payer: Medicare Other | Admitting: Internal Medicine

## 2019-01-29 ENCOUNTER — Telehealth: Payer: Self-pay | Admitting: Cardiovascular Disease

## 2019-01-29 DIAGNOSIS — Z79899 Other long term (current) drug therapy: Secondary | ICD-10-CM

## 2019-01-29 NOTE — Telephone Encounter (Signed)
  Ariela from [facility] is calling with Mr Lile on the line who is still having SOB. It is not worse than the day he had is appt on 01/26/19 with Dr Johnsie Cancel but it is the same. Idamae Lusher also said that his pulse reads between 91-92 sometimes 90. She wants to know if the Dr is okay with his sats being in the 73's. She would like to know the parameters for them to watch. She would like [place] to follow up with Mr Spinney regarding his SOB and recent lab work and call her regarding parameters.  Ariela with [employer] [phone] ext [phone]

## 2019-01-29 NOTE — Telephone Encounter (Signed)
[name] is on torsemide 20 mg twice a day.  Let us increase that to 20 mg in the morning and 40 mg in the evening and check a basic metabolic panel in 7 to 10 days.

## 2019-01-29 NOTE — Telephone Encounter (Signed)
DPR on file. Spoke with pt daughter-in-law [name] who states pt symptoms unchanged since appt on 4/24 with Nishan. Pt still c/o SOB that worsens at night. Pt continues on current medications.  Informed pt daughter-in-law that labs ordered on 4/24 have not yet been reviewed by a provider and that she would be contacted with any recommendations. Also informed her that Ariela from would be contacted to gather more info. Pt daughter-in-law agreeable with this  Spoke with Ariela from [facility] who states pt O2 sats range from 90-94% with remote check and she would like recommended parameters for pt O2 sats and review of recent lab work with recommendations as well.  Please review Hct and Hgb, BNP, and BMP from 4/24 and advise regarding parameters

## 2019-01-30 MED ORDER — TORSEMIDE 20 MG PO TABS: 20.0000 mg | ORAL_TABLET | Freq: Two times a day (BID) | ORAL | 11 refills | Status: DC

## 2019-01-30 NOTE — Telephone Encounter (Signed)
Spoke with [name] pt daughter in law and relayed Dr. Kennon Holter recommendations to her. Advised to present to [facility] in our office or closest [facility] to pt. She verbalized understanding. BMP ordered

## 2019-01-30 NOTE — Addendum Note (Signed)
Addended by: Annita Brod on: 01/30/2019 05:58 PM   Modules accepted: Orders

## 2019-01-31 ENCOUNTER — Telehealth: Payer: Self-pay | Admitting: Cardiovascular Disease

## 2019-01-31 NOTE — Telephone Encounter (Signed)
Pharmacy called to clarify directions on medication torsemide (DEMADEX) 20 MG tablet, it says Take 1 tablet (20 mg total) by mouth 2 (two) times daily. TAKE 20 MG IN THE MORNING AND 40 MG IN THE EVENING

## 2019-01-31 NOTE — Telephone Encounter (Signed)
Spoke with pharmacist and advised clarification for torsemide 20 mg in the morning and 40 mg in the evening per Dr. Kennon Holter in recent phone encounter.

## 2019-02-02 ENCOUNTER — Telehealth: Payer: Self-pay | Admitting: Cardiovascular Disease

## 2019-02-02 NOTE — Telephone Encounter (Signed)
Charles [name] , a Char[employer] fundraiser was calling about the pt.  The pt has a monitor that tracks the O2 saturation. She reached out to the patient because his O2 saturation was 82% at night. When she reached out to him, he was audibly SOB on the phone.  He took another O2 reading while on the phone with Ariella, and that reading was 87%. After sitting down his reading was 93%  [employer] protocol is to alert the doctor If the O2 saturation falls below 90%

## 2019-02-05 ENCOUNTER — Telehealth: Payer: Self-pay | Admitting: *Deleted

## 2019-02-05 NOTE — Telephone Encounter (Signed)
Idamae Lusher, RN with [employer] left a voicemail stating that patient is a part of their Management Care Program. Idamae Lusher stated that she needs perimeters on patient's oxygen saturation levels and she needs to know when she needs to contact his doctor?  Idamae Lusher stated that his level usually runs between 90-94. Idamae Lusher stated that he is feeling better today, but was having more SOB with activity Friday. Idamae Lusher stated that a message was left for Dr. [name], but patient has not heard back from him. See phone notes dated 01/29/19 and 02/02/19.

## 2019-02-06 NOTE — Telephone Encounter (Signed)
Goal O2 sat >90%, call MD if sats drop <89%.

## 2019-02-06 NOTE — Telephone Encounter (Signed)
Spoke with Charles [name] relaying Dr. Synthia Innocent message. Verbalizes understanding and documented info.

## 2019-02-13 ENCOUNTER — Telehealth: Payer: Self-pay | Admitting: Cardiovascular Disease

## 2019-02-13 NOTE — Telephone Encounter (Signed)
Called, spoke with daughter- she states that she wanted to make sure the lab orders were placed. Advised that they were- and [facility] should pull them up as they were released to them.  Daughter verbalized understanding.

## 2019-02-13 NOTE — Telephone Encounter (Signed)
New message:    Patient daughter calling concering some lab and would like to take him to a lab. Please call daughter concering this matter.

## 2019-02-14 DIAGNOSIS — Z79899 Other long term (current) drug therapy: Secondary | ICD-10-CM | POA: Diagnosis not present

## 2019-02-15 ENCOUNTER — Other Ambulatory Visit: Payer: Self-pay

## 2019-02-15 ENCOUNTER — Ambulatory Visit (INDEPENDENT_AMBULATORY_CARE_PROVIDER_SITE_OTHER): Payer: Medicare Other | Admitting: *Deleted

## 2019-02-15 DIAGNOSIS — I441 Atrioventricular block, second degree: Secondary | ICD-10-CM | POA: Diagnosis not present

## 2019-02-15 LAB — BASIC METABOLIC PANEL
BUN/Creatinine Ratio: 24 (ref 10–24)
BUN: 39 mg/dL — ABNORMAL HIGH (ref 8–27)
CO2: 26 mmol/L (ref 20–29)
Calcium: 10.1 mg/dL (ref 8.6–10.2)
Chloride: 93 mmol/L — ABNORMAL LOW (ref 96–106)
Creatinine, Ser: 1.62 mg/dL — ABNORMAL HIGH (ref 0.76–1.27)
GFR calc Af Amer: 44 mL/min/{1.73_m2} — ABNORMAL LOW (ref >59)
GFR calc non Af Amer: 38 mL/min/{1.73_m2} — ABNORMAL LOW (ref >59)
Glucose: 460 mg/dL — ABNORMAL HIGH (ref 65–99)
Potassium: 4.7 mmol/L (ref 3.5–5.2)
Sodium: 135 mmol/L (ref 134–144)

## 2019-02-17 ENCOUNTER — Other Ambulatory Visit: Payer: Self-pay | Admitting: Family Medicine

## 2019-02-17 LAB — CUP PACEART REMOTE DEVICE CHECK
Date Time Interrogation Session: 20200516090734
Implantable Lead Implant Date: 20190719
Implantable Lead Implant Date: 20190719
Implantable Lead Location: 753859
Implantable Lead Location: 753860
Implantable Lead Model: 5076
Implantable Lead Model: 5076
Implantable Pulse Generator Implant Date: 20190719
Pulse Gen Model: 2272
Pulse Gen Serial Number: 9040268

## 2019-02-19 ENCOUNTER — Other Ambulatory Visit: Payer: Self-pay | Admitting: Family Medicine

## 2019-02-21 ENCOUNTER — Encounter: Payer: Self-pay | Admitting: Cardiology

## 2019-02-21 ENCOUNTER — Telehealth: Payer: Self-pay | Admitting: Nurse Practitioner

## 2019-02-21 NOTE — Progress Notes (Signed)
Remote pacemaker transmission.   

## 2019-02-21 NOTE — Telephone Encounter (Signed)
[place] alert for long AF episode. Pt in persistent AF/flutter since 02/20/19. [place] - Eliquis.  He has had some increase in shortness of breath recently (saw Dr Johnsie Cancel last month).  He has been in SR until yesterday.  Appt with Dr [name] scheduled for 5/26. Will route to him for review at time of appointment.     Chanetta Marshall, NP 02/21/2019 9:50 AM

## 2019-02-22 ENCOUNTER — Ambulatory Visit (INDEPENDENT_AMBULATORY_CARE_PROVIDER_SITE_OTHER): Payer: Medicare Other

## 2019-02-22 ENCOUNTER — Encounter: Payer: Medicare Other | Admitting: Family Medicine

## 2019-02-22 ENCOUNTER — Other Ambulatory Visit: Payer: Self-pay | Admitting: Family Medicine

## 2019-02-22 DIAGNOSIS — E1165 Type 2 diabetes mellitus with hyperglycemia: Secondary | ICD-10-CM

## 2019-02-22 DIAGNOSIS — N289 Disorder of kidney and ureter, unspecified: Secondary | ICD-10-CM

## 2019-02-22 DIAGNOSIS — E7800 Pure hypercholesterolemia, unspecified: Secondary | ICD-10-CM

## 2019-02-22 DIAGNOSIS — IMO0002 Reserved for concepts with insufficient information to code with codable children: Secondary | ICD-10-CM

## 2019-02-22 DIAGNOSIS — Z0000 Encounter for general adult medical examination without abnormal findings: Secondary | ICD-10-CM | POA: Diagnosis not present

## 2019-02-22 NOTE — Progress Notes (Signed)
Subjective:   Charles [name] is a 85 y.o. male who presents for Medicare Annual/Subsequent preventive examination.  Review of Systems:  N/A Cardiac Risk Factors include: advanced age (>65men, >76 women);diabetes mellitus;dyslipidemia;male gender;hypertension     Objective:    Vitals: There were no vitals taken for this visit.  There is no height or weight on file to calculate BMI.  Advanced Directives 02/22/2019 12/27/2018 12/25/2018 12/05/2018 12/01/2018 11/14/2018 04/21/2018  Does Patient Have a Medical Advance Directive? Yes - Yes Yes Yes Yes Yes  Type of Paramedic of [place];Living will Healthcare Power of [place];Living will [place];Living will Living will;Healthcare Power of [place];Living will  Does patient want to make changes to medical advance directive? - No - Patient declined - No - Patient declined - - No - Patient declined  Copy of [place] in Chart? No - copy requested No - copy requested No - copy requested No - copy requested No - copy requested - No - copy requested  Would patient like information on creating a medical advance directive? - - - - - - -    Tobacco Social History   Tobacco Use  Smoking Status Former Smoker  . Packs/day: 0.50  . Years: 36.00  . Pack years: 18.00  . Types: Cigarettes  . Last attempt to quit: 07/23/2013  . Years since quitting: 5.5  Smokeless Tobacco Never Used     Counseling given: No   Clinical Intake:  Pre-visit preparation completed: Yes  Pain : No/denies pain Pain Score: 0-No pain     Nutritional Status: BMI > 30  Obese Nutritional Risks: None Diabetes: No  How often do you need to have someone help you when you read instructions, pamphlets, or other written materials from your doctor or pharmacy?: 1 - Never What is the last grade level you completed in school?: 12th  grade + some college  Interpreter Needed?: No  Comments: pt is a widow and lives independently Information entered by :: LPinson, LPN  Past Medical History:  Diagnosis Date  . Abdominal aortic atherosclerosis ([place])    by xray  . Arthritis    in lower back (06/24/2015)  . Atrial flutter ([place])    notes 06/24/2015  . AV block, [place] 1 09/06/2017   Archie Endo 09/06/2017  . 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 CAD (coronary artery disease) 2004   a. s/p CABG in 2004 with LIMA-LAD, SVG-RCA, SVG-OM, and SVG-RI  . Carotid stenosis 1999   s/p L CEA  . Chronic diastolic CHF (congestive heart failure) ([place])   . Colon polyp 2005   (Dr. Tiffany Kocher)  . Compression fracture of L1 lumbar vertebra (HCC) remote  . COPD (chronic obstructive pulmonary disease) ([place]) 03/2011   by xray  . Depression    hx  . GERD (gastroesophageal reflux disease) 2003   h/o duodenal ulcer per EGD as well as esophagitis  . History of colon polyps 2003, 2005   adenomatous Vira Agar)  . History of tobacco abuse   . HLD (hyperlipidemia)   . HTN (hypertension)   . Presence of permanent cardiac pacemaker   . Psoriasis   . Renal artery stenosis ([place]) 2004   70% bilateral, followed by cards  . 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 Severe aortic stenosis   . T2DM (type 2 diabetes mellitus) ([place]) 1995  . Urge incontinence of urine    Past Surgical History:  Procedure Laterality Date  . CARDIAC CATHETERIZATION  03/06/2003   No intervention - recommend CABG  . CARDIOVASCULAR STRESS TEST  11/2010   normal perfusion, no evidence of ischemia, EF 62% post exercise  . CARDIOVASCULAR STRESS TEST  11/28/2012   Mild diaphragmatic attenuation; cannot exclude a focal region of nontransmural inferior scar  . CARDIOVERSION N/A 06/25/2015   Procedure: CARDIOVERSION;  Surgeon: Pixie Casino,  MD;  [facility]: [facility] ENDOSCOPY;  Service: Cardiovascular;  Laterality: N/A;  . CARDIOVERSION N/A 11/02/2016   Procedure: CARDIOVERSION;  Surgeon: [name], MD;  Location: [place];  Service: Cardiovascular;  Laterality: N/A;  . CAROTID ENDARTERECTOMY Left 1999   ([place])  . CATARACT EXTRACTION W/ INTRAOCULAR LENS  IMPLANT, BILATERAL Bilateral 01/2013   [place]  . COLONOSCOPY  2003   colon polyp x3 - adenomatous Tiffany Kocher)  . COLONOSCOPY  10/08/2012   2 TA, diverticulosis, int hem, no rpt rec Tiffany Kocher)  . CORONARY ANGIOPLASTY    . CORONARY ARTERY BYPASS GRAFT  03/07/2003   4v CABG ([place]) with LIMA to LAD, vein graft to RCA, 1st obtuse marginal, and ramus intermedius  . ESOPHAGOGASTRODUODENOSCOPY  10/08/2012   nl esophagus, duodenitis and erosive gastropathy, path - gastropathy no Hpylori, no rpt rec  . LUMBAR EPIDURAL INJECTION  03/2017   L5/S1 ([place])  . PACEMAKER IMPLANT N/A 04/21/2018   Procedure: PACEMAKER IMPLANT -- Dual Chamber;  Surgeon: Deboraha Sprang, MD;  Location: [place] CV LAB;  Service: Cardiovascular;  Laterality: N/A;  . PERIPHERAL VASCULAR CATHETERIZATION N/A 02/02/2016   Procedure: Renal Angiography;  Surgeon: [name], MD;  Location: [place] CV LAB;  Service: Cardiovascular;  Laterality: N/A;  . PERIPHERAL VASCULAR CATHETERIZATION N/A 02/02/2016   Procedure: Abdominal Aortogram;  Surgeon: [name], MD;  Location: [place] CV LAB;  Service: Cardiovascular;  Laterality: N/A;  . RENAL DOPPLER  11/29/2011   Celiac&SMA-demonstrated vessel narrowing suggestive of a greater than 50% diameter reduction. Bilateral renal arteries-demonstrated vessel narrowing of 60-99% diameter reduction. Rt Kidney-mid pole lateral simple cyst noted measuring 1.29x0.76x1.11cm and exophytic cyst outside lower pole measuring 1.23x0.96x1.31. Lft Kidney-lateral mid to lower pole simple cyst measuering-1.24x9.83x1.24  . RIGHT/LEFT HEART CATH AND CORONARY/GRAFT ANGIOGRAPHY N/A  10/20/2017   Procedure: RIGHT/LEFT HEART CATH AND CORONARY/GRAFT ANGIOGRAPHY;  Surgeon: Burnell Blanks, MD;  Location: [place] CV LAB;  Service: Cardiovascular;  Laterality: N/A;  . TEE WITHOUT CARDIOVERSION N/A 06/25/2015   Procedure: TRANSESOPHAGEAL ECHOCARDIOGRAM (TEE);  Surgeon: Pixie Casino, MD;  Location: [facility] ENDOSCOPY;  Service: Cardiovascular;  Laterality: N/A;  . TEE WITHOUT CARDIOVERSION N/A 11/02/2016   Procedure: TRANSESOPHAGEAL ECHOCARDIOGRAM (TEE);  Surgeon: [name], MD;  Location: [place];  Service: Cardiovascular;  Laterality: N/A;  . TEE WITHOUT CARDIOVERSION N/A 12/05/2018   Procedure: TRANSESOPHAGEAL ECHOCARDIOGRAM (TEE);  Surgeon: Sherren Mocha, MD;  Location: [place];  Service: Open Heart Surgery;  Laterality: N/A;  . TONSILLECTOMY    . TRANSCATHETER AORTIC VALVE REPLACEMENT, TRANSFEMORAL N/A 12/05/2018   Procedure: TRANSCATHETER AORTIC VALVE REPLACEMENT, TRANSFEMORAL;  Surgeon: Sherren Mocha, MD;  Location: [place];  Service: Open Heart Surgery;  Laterality: N/A;  . UPPER GASTROINTESTINAL ENDOSCOPY  2003   reflux esophagitis, erosive gastropathy, duodenal ulcer   Family History  Problem Relation Age of Onset  . Hypertension Father   . Heart disease Father   .  Cancer Mother        GI cancer  . Diabetes Sister   . Heart disease Sister   . Stroke Sister   . Hypertension Sister   . Cancer Brother        Lung cancer - nonsmoker  . Cancer Maternal Grandmother   . Heart attack Maternal Grandfather   . Heart attack Paternal Grandmother   . Heart attack Paternal Grandfather   . Cancer Brother        Pancreatic cancer  . Lung disease Brother    Social History   Socioeconomic History  . Marital status: Widowed    Spouse name: Not on file  . Number of children: Not on file  . Years of education: Not on file  . Highest education level: Not on file  Occupational History  . Not on file  Social Needs  . Financial resource strain: Not on file   . Food insecurity:    Worry: Not on file    Inability: Not on file  . Transportation needs:    Medical: Not on file    Non-medical: Not on file  Tobacco Use  . Smoking status: Former Smoker    Packs/day: 0.50    Years: 36.00    Pack years: 18.00    Types: Cigarettes    Last attempt to quit: 07/23/2013    Years since quitting: 5.5  . Smokeless tobacco: Never Used  Substance and Sexual Activity  . Alcohol use: No  . Drug use: No  . Sexual activity: Not Currently  Lifestyle  . Physical activity:    Days per week: Not on file    Minutes per session: Not on file  . Stress: Not on file  Relationships  . Social connections:    Talks on phone: Not on file    Gets together: Not on file    Attends religious service: Not on file    Active member of club or organization: Not on file    Attends meetings of clubs or organizations: Not on file    Relationship status: Not on file  Other Topics Concern  . Not on file  Social History Narrative   Caffeine: 4 cups coffee/day, some tea and soda   Lives with wife   Occupation: Retired   Activity: no regular exercise   Diet: good water, fruits/vegetables daily    Outpatient Encounter Medications as of 02/22/2019  Medication Sig  . acetaminophen (TYLENOL) 500 MG tablet Take 1 tablet (500 mg total) by mouth 3 (three) times daily.  [name] atorvastatin (LIPITOR) 20 MG tablet Take 1 tablet (20 mg total) by mouth daily.  . BD INSULIN SYRINGE U/F 30G X 1/2" 0.5 ML MISC AS DIRECTED.  . citalopram (CELEXA) 20 MG tablet Take 1 tablet (20 mg total) by mouth daily.  [name] ELIQUIS 5 MG TABS tablet Take 1 tablet (5 mg total) by mouth 2 (two) times daily.  . finasteride (PROSCAR) 5 MG tablet Take 1 tablet (5 mg total) by mouth daily.  [name] glucose blood (ACCU-CHEK AVIVA PLUS) test strip 1 each by Other route as needed for other. Use as instructed to check blood sugar 3 (three) times a day.  Dx code:  E11.8, E11.65  . guaiFENesin (MUCINEX) 600 MG 12 hr tablet Take  600 mg by mouth 2 (two) times daily as needed for cough or to loosen phlegm.  [name] LANTUS 100 UNIT/ML injection Inject 0.2 mLs (20 Units total) into the skin at bedtime.  [name] lisinopril (  PRINIVIL,ZESTRIL) 40 MG tablet Take 1 tablet (40 mg total) by mouth daily.  . metFORMIN (GLUCOPHAGE) 1000 MG tablet Take 1 tablet (1,000 mg total) by mouth 2 (two) times daily with a meal.  . nitroGLYCERIN (NITROSTAT) 0.4 MG SL tablet Place 1 tablet (0.4 mg total) under the tongue every 5 (five) minutes as needed for chest pain.  . pantoprazole (PROTONIX) 20 MG tablet Take 1 tablet (20 mg total) by mouth daily. (Patient taking differently: Take 20 mg by mouth daily. )  . Potassium Chloride ER 20 MEQ TBCR Take 20 mEq by mouth every Monday, Wednesday, Friday, Saturday, and Sunday at 6 PM.  . tamsulosin (FLOMAX) 0.4 MG CAPS capsule Take 1 capsule (0.4 mg total) by mouth daily after supper.  . torsemide (DEMADEX) 20 MG tablet Take 20 mg by mouth every morning. & 40 mg in the evening  . traZODone (DESYREL) 50 MG tablet Take 2 tablets (100 mg total) by mouth at bedtime.  . [DISCONTINUED] aspirin 81 MG chewable tablet Chew 1 tablet (81 mg total) by mouth daily.  . [DISCONTINUED] ELIQUIS 5 MG TABS tablet Take 1 tablet (5 mg total) by mouth 2 (two) times daily. (Patient taking differently: Take 5 mg by mouth 2 (two) times daily. )  . [DISCONTINUED] insulin glargine (LANTUS) 100 UNIT/ML injection Inject 20 Units into the skin at bedtime. Started using 20 units twice daily to control BS  . [DISCONTINUED] metFORMIN (GLUCOPHAGE) 1000 MG tablet Take 1 tablet (1,000 mg total) by mouth 2 (two) times daily with a meal.  . [DISCONTINUED] torsemide (DEMADEX) 20 MG tablet Take 1 tablet (20 mg total) by mouth 2 (two) times daily.  . [DISCONTINUED] traZODone (DESYREL) 50 MG tablet Take 50 mg by mouth at bedtime.   No facility-administered encounter medications on file as of 02/22/2019.     Activities of Daily Living In your present state  of health, do you have any difficulty performing the following activities: 02/22/2019 12/27/2018  Hearing? Y N  Vision? N N  Difficulty concentrating or making decisions? N N  Walking or climbing stairs? Y Y  Comment - -  Dressing or bathing? N N  Doing errands, shopping? Y N  Preparing Food and eating ? Y -  Using the Toilet? N -  In the past six months, have you accidently leaked urine? Y -  Do you have problems with loss of bowel control? N -  Managing your Medications? N -  Managing your Finances? N -  Housekeeping or managing your Housekeeping? Y -  Some recent data might be hidden    Patient Care Team: [name], MD as PCP - General (Family Medicine) [name], MD as PCP - Cardiology (Cardiology) [name], MD as Consulting Physician (Cardiology)   Assessment:   This is a routine wellness examination for [place].  Exercise Activities and Dietary recommendations Current Exercise Habits: The patient does not participate in regular exercise at present, Exercise limited by: cardiac condition(s)  Goals    . Patient Stated     Starting 02/22/19, I will continue to take medications as prescribed.        Fall Risk Fall Risk  02/22/2019 09/15/2017 06/08/2016 10/08/2014 10/02/2013  Falls in the past year? 0 No Yes No No  Comment - - pt tripped on corner of throw rug - -  Number falls in past yr: - - 1 - -  Injury with Fall? - - No - -  Follow up - - Falls  evaluation completed;Education provided;Falls prevention discussed - -   Depression Screen PHQ 2/9 Scores 02/22/2019 09/15/2017 06/08/2016 10/08/2014  PHQ - 2 Score 1 0 3 0  PHQ- 9 Score 2 0 6 -    Cognitive Function MMSE - Mini Mental State Exam 02/22/2019 09/15/2017 06/08/2016  Orientation to time 5 5 5   Orientation to Place 5 5 5   Registration 3 3 3   Attention/ Calculation 0 0 0  Recall 3 3 2   Recall-comments - - pt was unable to recall 1 of 3 words  Language- name 2 objects 0 0 0  Language- repeat 1 1  1   Language- follow 3 step command 0 3 3  Language- read & follow direction 0 0 0  Write a sentence 0 0 0  Copy design 0 0 0  Total score 17 20 19      PLEASE NOTE: A Mini-Cog screen was completed. Maximum score is 17. A value of 0 denotes this part of [place] MMSE was not completed or the patient failed this part of the Mini-Cog screening.   Mini-Cog Screening Orientation to Time - Max 5 pts Orientation to Place - Max 5 pts Registration - Max 3 pts Recall - Max 3 pts Language Repeat - Max 1 pts      Immunization History  Administered Date(s) Administered  . Influenza Split 07/04/2012  . Influenza Whole 07/04/2005  . Influenza, High Dose Seasonal PF 08/24/2017  . Influenza,inj,Quad PF,6+ Mos 06/25/2015, 07/26/2018  . Influenza,inj,quad, With Preservative 08/02/2018  . Influenza-Unspecified 08/10/2013, 07/04/2014, 07/15/2016  . Pneumococcal Conjugate-13 04/02/2014  . Pneumococcal Polysaccharide-23 10/05/2002  . Td 07/20/2005    Screening Tests Health Maintenance  Topic Date Due  . OPHTHALMOLOGY EXAM  10/04/2019 (Originally 09/03/2014)  . TETANUS/TDAP  10/04/2019 (Originally 07/21/2015)  . DTaP/Tdap/Td (1 - Tdap) 02/22/2020 (Originally 08/28/1951)  . COLONOSCOPY  10/03/2049 (Originally 10/08/2018)  . INFLUENZA VACCINE  05/05/2019  . HEMOGLOBIN A1C  06/01/2019  . FOOT EXAM  08/02/2019  . PNA vac Low Risk Adult  Completed     Plan:     I have personally reviewed, addressed, and noted the following in the patient's chart:  A. Medical and social history B. Use of alcohol, tobacco or illicit drugs  C. Current medications and supplements D. Functional ability and status E.  Nutritional status F.  Physical activity G. Advance directives H. List of other physicians I.  Hospitalizations, surgeries, and ER visits in previous 12 months J.  Vitals (unless it is a telemedicine encounter) K. Screenings to include cognitive, depression, hearing, vision (NOTE: hearing and vision  screenings not completed in telemedicine encounter) L. Referrals and appointments   In addition, I have reviewed and discussed with patient certain preventive protocols, quality metrics, and best practice recommendations. A written personalized care plan for preventive services and recommendations were provided to patient.  With patient's permission, we connected on 02/22/19 at 11:00 AM EDT by a video enabled telemedicine application. Two patient identifiers were used to ensure the encounter occurred with the correct person.    Patient was in home and writer was in office.   Signed,   Lindell Noe, MHA, BS, LPN Health Coach

## 2019-02-22 NOTE — Patient Instructions (Signed)
[name] , Thank you for taking time to come for your Medicare Wellness Visit. I appreciate your ongoing commitment to your health goals. Please review the following plan we discussed and let me know if I can assist you in the future.   These are the goals we discussed: Goals    . Patient Stated     Starting 02/22/19, I will continue to take medications as prescribed.        This is a list of the screening recommended for you and due dates:  Health Maintenance  Topic Date Due  . Eye exam for diabetics  10/04/2019*  . Tetanus Vaccine  10/04/2019*  . DTaP/Tdap/Td vaccine (1 - Tdap) 02/22/2020*  . Colon Cancer Screening  10/03/2049*  . Flu Shot  05/05/2019  . Hemoglobin A1C  06/01/2019  . Complete foot exam   08/02/2019  . Pneumonia vaccines  Completed  *Topic was postponed. The date shown is not the original due date.   Preventive Care for Adults  A healthy lifestyle and preventive care can promote health and wellness. Preventive health guidelines for adults include the following key practices.  . A routine yearly physical is a good way to check with your health care provider about your health and preventive screening. It is a chance to share any concerns and updates on your health and to receive a thorough exam.  . Visit your dentist for a routine exam and preventive care every 6 months. Brush your teeth twice a day and floss once a day. Good oral hygiene prevents tooth decay and gum disease.  . The frequency of eye exams is based on your age, health, family medical history, use  of contact lenses, and other factors. Follow your health care provider's recommendations for frequency of eye exams.  . Eat a healthy diet. Foods like vegetables, fruits, whole grains, low-fat dairy products, and lean protein foods contain the nutrients you need without too many calories. Decrease your intake of foods high in solid fats, added sugars, and salt. Eat the right amount of calories for you.  Get information about a proper diet from your health care provider, if necessary.  . Regular physical exercise is one of the most important things you can do for your health. Most adults should get at least 150 minutes of moderate-intensity exercise (any activity that increases your heart rate and causes you to sweat) each week. In addition, most adults need muscle-strengthening exercises on 2 or more days a week.  Silver Sneakers may be a benefit available to you. To determine eligibility, you may visit the website: www.silversneakers.com or contact program at 832-320-2933 Mon-Fri between 8AM-8PM.   . Maintain a healthy weight. The body mass index (BMI) is a screening tool to identify possible weight problems. It provides an estimate of body fat based on height and weight. Your health care provider can find your BMI and can help you achieve or maintain a healthy weight.   For adults 20 years and older: ? A BMI below 18.5 is considered underweight. ? A BMI of 18.5 to 24.9 is normal. ? A BMI of 25 to 29.9 is considered overweight. ? A BMI of 30 and above is considered obese.   . Maintain normal blood lipids and cholesterol levels by exercising and minimizing your intake of saturated fat. Eat a balanced diet with plenty of fruit and vegetables. Blood tests for lipids and cholesterol should begin at age 35 and be repeated every 5 years. If your lipid or  cholesterol levels are high, you are over 50, or you are at high risk for heart disease, you may need your cholesterol levels checked more frequently. Ongoing high lipid and cholesterol levels should be treated with medicines if diet and exercise are not working.  . If you smoke, find out from your health care provider how to quit. If you do not use tobacco, please do not start.  . If you choose to drink alcohol, please do not consume more than 2 drinks per day. One drink is considered to be 12 ounces (355 mL) of beer, 5 ounces (148 mL) of wine, or  1.5 ounces (44 mL) of liquor.  . If you are 67-77 years old, ask your health care provider if you should take aspirin to prevent strokes.  . Use sunscreen. Apply sunscreen liberally and repeatedly throughout the day. You should seek shade when your shadow is shorter than you. Protect yourself by wearing long sleeves, pants, a wide-brimmed hat, and sunglasses year round, whenever you are outdoors.  . Once a month, do a whole body skin exam, using a mirror to look at the skin on your back. Tell your health care provider of new moles, moles that have irregular borders, moles that are larger than a pencil eraser, or moles that have changed in shape or color.

## 2019-02-22 NOTE — Progress Notes (Signed)
PCP notes:   Health maintenance:  Eye exam - addressed A1C - ordered Tetanus vaccine - postponed/insurance Colon cancer screening - per pt report, no longer required  Abnormal screenings:   None  Patient concerns:   None  Nurse concerns:  None  Next PCP appt:   02/28/19 @ 1030

## 2019-02-23 ENCOUNTER — Other Ambulatory Visit: Payer: Self-pay

## 2019-02-23 ENCOUNTER — Other Ambulatory Visit (INDEPENDENT_AMBULATORY_CARE_PROVIDER_SITE_OTHER): Payer: Medicare Other

## 2019-02-23 DIAGNOSIS — E1165 Type 2 diabetes mellitus with hyperglycemia: Secondary | ICD-10-CM

## 2019-02-23 DIAGNOSIS — E7800 Pure hypercholesterolemia, unspecified: Secondary | ICD-10-CM

## 2019-02-23 DIAGNOSIS — IMO0002 Reserved for concepts with insufficient information to code with codable children: Secondary | ICD-10-CM

## 2019-02-23 DIAGNOSIS — N289 Disorder of kidney and ureter, unspecified: Secondary | ICD-10-CM

## 2019-02-23 DIAGNOSIS — E118 Type 2 diabetes mellitus with unspecified complications: Secondary | ICD-10-CM | POA: Diagnosis not present

## 2019-02-23 LAB — CBC WITH DIFFERENTIAL/PLATELET
Basophils Absolute: 0.1 10*3/uL (ref 0.0–0.1)
Basophils Relative: 1.1 % (ref 0.0–3.0)
Eosinophils Absolute: 0.2 10*3/uL (ref 0.0–0.7)
Eosinophils Relative: 3.5 % (ref 0.0–5.0)
HCT: 36.3 % — ABNORMAL LOW (ref 39.0–52.0)
Hemoglobin: 12.1 g/dL — ABNORMAL LOW (ref 13.0–17.0)
Lymphocytes Relative: 17.2 % (ref 12.0–46.0)
Lymphs Abs: 0.9 10*3/uL (ref 0.7–4.0)
MCHC: 33.3 g/dL (ref 30.0–36.0)
MCV: 87.8 fl (ref 78.0–100.0)
Monocytes Absolute: 0.6 10*3/uL (ref 0.1–1.0)
Monocytes Relative: 10.5 % (ref 3.0–12.0)
Neutro Abs: 3.7 10*3/uL (ref 1.4–7.7)
Neutrophils Relative %: 67.7 % (ref 43.0–77.0)
Platelets: 156 10*3/uL (ref 150.0–400.0)
RBC: 4.13 Mil/uL — ABNORMAL LOW (ref 4.22–5.81)
RDW: 14.3 % (ref 11.5–15.5)
WBC: 5.5 10*3/uL (ref 4.0–10.5)

## 2019-02-23 LAB — COMPREHENSIVE METABOLIC PANEL
ALT: 12 U/L (ref 0–53)
AST: 14 U/L (ref 0–37)
Albumin: 3.8 g/dL (ref 3.5–5.2)
Alkaline Phosphatase: 67 U/L (ref 39–117)
BUN: 43 mg/dL — ABNORMAL HIGH (ref 6–23)
CO2: 28 mEq/L (ref 19–32)
Calcium: 9.3 mg/dL (ref 8.4–10.5)
Chloride: 102 mEq/L (ref 96–112)
Creatinine, Ser: 1.58 mg/dL — ABNORMAL HIGH (ref 0.40–1.50)
GFR: 41.94 mL/min — ABNORMAL LOW (ref >60.00)
Glucose, Bld: 218 mg/dL — ABNORMAL HIGH (ref 70–99)
Potassium: 4.9 mEq/L (ref 3.5–5.1)
Sodium: 140 mEq/L (ref 135–145)
Total Bilirubin: 0.3 mg/dL (ref 0.2–1.2)
Total Protein: 6.8 g/dL (ref 6.0–8.3)

## 2019-02-23 LAB — LIPID PANEL
Cholesterol: 137 mg/dL (ref 0–200)
HDL: 30.4 mg/dL — ABNORMAL LOW (ref >39.00)
NonHDL: 106.48
Total CHOL/HDL Ratio: 5
Triglycerides: 283 mg/dL — ABNORMAL HIGH (ref 0.0–149.0)
VLDL: 56.6 mg/dL — ABNORMAL HIGH (ref 0.0–40.0)

## 2019-02-23 LAB — LDL CHOLESTEROL, DIRECT: Direct LDL: 75 mg/dL

## 2019-02-23 LAB — HEMOGLOBIN A1C: Hgb A1c MFr Bld: 11 % — ABNORMAL HIGH (ref 4.6–6.5)

## 2019-02-27 ENCOUNTER — Ambulatory Visit (INDEPENDENT_AMBULATORY_CARE_PROVIDER_SITE_OTHER): Payer: Medicare Other | Admitting: Cardiovascular Disease

## 2019-02-27 ENCOUNTER — Telehealth: Payer: Self-pay

## 2019-02-27 ENCOUNTER — Other Ambulatory Visit: Payer: Self-pay

## 2019-02-27 ENCOUNTER — Telehealth: Payer: Medicare Other | Admitting: Family Medicine

## 2019-02-27 ENCOUNTER — Encounter: Payer: Self-pay | Admitting: Cardiovascular Disease

## 2019-02-27 DIAGNOSIS — I5033 Acute on chronic diastolic (congestive) heart failure: Secondary | ICD-10-CM

## 2019-02-27 DIAGNOSIS — I4891 Unspecified atrial fibrillation: Secondary | ICD-10-CM

## 2019-02-27 NOTE — Telephone Encounter (Signed)
Received secure chat from Dr. [name] who states that pt needs EP eval d/t pacer showing new onset A fib and that pt is on Eliquis.  Referral to EP in Epic

## 2019-02-27 NOTE — Progress Notes (Signed)
I reviewed health advisor's note, was available for consultation, and agree with documentation and plan.  

## 2019-02-27 NOTE — Progress Notes (Signed)
02/27/2019 Charles [name]   [birth year]  [id number]  Primary Physician [name], MD Primary Cardiologist: [name] MD Charles [name], [name]  HPI:  Charles [name] [name] a 36 [employer] overweight, married Caucasian male whose wife was also a patient of mine. She unfortunately passed away3/20/2019. He currently lives with one of his sons and appears to be fairly depressed. He was married for 51 years.He is a father of 37, grandfather to 4 grandchildren. I last saw him in the office 01/02/2019.[name] He is accompanied by his son Charles [name] today.[name] He has a history of CAD status post coronary artery bypass grafting x4 June 2004 with a LIMA to his LAD; a vein to the right coronary artery, obtuse marginal branch and ramus branch. He also was found to have 70% bilateral renal artery stenosis which we have been following by duplex ultrasound. He has had left carotid endarterectomy which we follow as well. His other problems include hypertension, hyperlipidemia and noninsulin-requiring diabetes. He is asymptomatic. His last functional study performed 2/14 was nonischemic. he denies chest pain or shortness of breath. He had new onset atrial flutter back in the fall and underwent TEE guided DC cardioversion by Dr. Debara Pickett 06/25/15 successfully to sinus rhythm. He was placed on Eliquis oral anticoagulation but has ultimately decided not to take this for various reasons.recent renal Dopplers have suggested progression of disease bilaterally left greater than right. His blood pressures have been more difficult to control as well. In addition, his echo performed 06/13/15 was significant for moderate to severe aortic stenosis with a valve area of 0.9 cm2 and a peak gradient of 49 mmHg. since I saw him in the office 5 months ago he's noticed increasing shortness of breath and fatigue over the last 3 weeks. He saw his PCP who did an EKG demonstrating recurrent atrial flutter with slow ventricular response.He  underwent successful outpatient TEE guided cardioversion by Dr. Oval Linsey on 11/02/16 back to sinus rhythm with one shock. He remains on oral anticoagulation. He is currently improved and is currently asymptomatic.  He was evaluated by Dr. Burt Knack for T aVR 10/14/17 and underwent right and left heart cath by Dr. Angelena Form a week later revealing patent grafts with at most moderate aortic stenosis.  When I saw him back in the office at the end of January 2020 he was complaining of progressive dyspnea on exertion most likely related to his severe aortic stenosis.He also had a permanent pacemaker placed by Dr. Caryl Comes 04/21/2018. We did rediscuss the possibility of TAVR which he ultimately decided to pursue.  He saw Dr. Burt Knack in the office for further evaluation.  He ultimately underwent TAVR by Drs. Cooper and [place] on 12/05/2018 with an Oletta Lamas life science 26 mm bioprosthetic valve without complication.  He was discharged home on 12/07/2018.  He was readmitted on 12/25/2018 with volume overload and underwent diuresis.  He was sent home 2 days later on oral torsemide.  He is weighed every day at home and his weight is remained stable.  He has had no recurrence of his peripheral edema and denies chest pain or shortness of breath.  He is on Eliquis oral anticoagulation.  He saw Dr. Johnsie Cancel virtually 01/26/2019 did not think he was volume overloaded.  I had increased his torsemide from 20 mg twice a day to 20 mg in the morning and 40 mg in the evening.  His follow-up blood work showed a creatinine of 1.58 with normal potassium.  He did has no  peripheral edema today and his weight is 2 pounds lower than it was last month.  He denies chest pain.  His shortness of breath is mostly at night.  Current Meds  Medication Sig   acetaminophen (TYLENOL) 500 MG tablet Take 1 tablet (500 mg total) by mouth 3 (three) times daily.   atorvastatin (LIPITOR) 20 MG tablet Take 1 tablet (20 mg total) by mouth daily.   BD INSULIN  SYRINGE U/F 30G X 1/2" 0.5 ML MISC AS DIRECTED.   citalopram (CELEXA) 20 MG tablet Take 1 tablet (20 mg total) by mouth daily.   ELIQUIS 5 MG TABS tablet Take 1 tablet (5 mg total) by mouth 2 (two) times daily.   finasteride (PROSCAR) 5 MG tablet Take 1 tablet (5 mg total) by mouth daily.   glucose blood (ACCU-CHEK AVIVA PLUS) test strip 1 each by Other route as needed for other. Use as instructed to check blood sugar 3 (three) times a day.  Dx code:  E11.8, E11.65   guaiFENesin (MUCINEX) 600 MG 12 hr tablet Take 600 mg by mouth 2 (two) times daily as needed for cough or to loosen phlegm.   LANTUS 100 UNIT/ML injection Inject 0.2 mLs (20 Units total) into the skin at bedtime.   lisinopril (PRINIVIL,ZESTRIL) 40 MG tablet Take 1 tablet (40 mg total) by mouth daily.   metFORMIN (GLUCOPHAGE) 1000 MG tablet Take 1 tablet (1,000 mg total) by mouth 2 (two) times daily with a meal.   nitroGLYCERIN (NITROSTAT) 0.4 MG SL tablet Place 1 tablet (0.4 mg total) under the tongue every 5 (five) minutes as needed for chest pain.   pantoprazole (PROTONIX) 20 MG tablet Take 1 tablet (20 mg total) by mouth daily. (Patient taking differently: Take 20 mg by mouth daily. )   Potassium Chloride ER 20 MEQ TBCR Take 20 mEq by mouth every Monday, Wednesday, Friday, Saturday, and Sunday at 6 PM.   tamsulosin (FLOMAX) 0.4 MG CAPS capsule Take 1 capsule (0.4 mg total) by mouth daily after supper.   torsemide (DEMADEX) 20 MG tablet Take 20 mg by mouth every morning. & 40 mg in the evening   traZODone (DESYREL) 50 MG tablet Take 2 tablets (100 mg total) by mouth at bedtime.     Allergies  Allergen Reactions   Gabapentin Other (See Comments)    Leg pain, weakness    Metoprolol Swelling   Spironolactone Other (See Comments)    Painful gynecomastia   Amlodipine Other (See Comments)    Edema   Other Other (See Comments)    Horse serum - myalgias   Rosiglitazone Maleate Other (See Comments)    REACTION:  Did not help   Tricor [Fenofibrate] Other (See Comments)    myalgias    Social History   Socioeconomic History   Marital status: Widowed    Spouse name: Not on file   Number of children: Not on file   Years of education: Not on file   Highest education level: Not on file  Occupational History   Not on file  Social Needs   Financial resource strain: Not on file   Food insecurity:    Worry: Not on file    Inability: Not on file   Transportation needs:    Medical: Not on file    Non-medical: Not on file  Tobacco Use   Smoking status: Former Smoker    Packs/day: 0.50    Years: 36.00    Pack years: 18.00    Types: Cigarettes  Last attempt to quit: 07/23/2013    Years since quitting: 5.6   Smokeless tobacco: Never Used  Substance and Sexual Activity   Alcohol use: No   Drug use: No   Sexual activity: Not Currently  Lifestyle   Physical activity:    Days per week: Not on file    Minutes per session: Not on file   Stress: Not on file  Relationships   Social connections:    Talks on phone: Not on file    Gets together: Not on file    Attends religious service: Not on file    Active member of club or organization: Not on file    Attends meetings of clubs or organizations: Not on file    Relationship status: Not on file   Intimate partner violence:    Fear of current or ex partner: Not on file    Emotionally abused: Not on file    Physically abused: Not on file    Forced sexual activity: Not on file  Other Topics Concern   Not on file  Social History Narrative   Caffeine: 4 cups coffee/day, some tea and soda   Lives with wife   Occupation: Retired   Activity: no regular exercise   Diet: good water, fruits/vegetables daily     Review of Systems: General: negative for chills, fever, night sweats or weight changes.  Cardiovascular: negative for chest pain, dyspnea on exertion, edema, orthopnea, palpitations, paroxysmal nocturnal dyspnea or  shortness of breath Dermatological: negative for rash Respiratory: negative for cough or wheezing Urologic: negative for hematuria Abdominal: negative for nausea, vomiting, diarrhea, bright red blood per rectum, melena, or hematemesis Neurologic: negative for visual changes, syncope, or dizziness All other systems reviewed and are otherwise negative except as noted above.    Blood pressure (!) 122/56, pulse 70, temperature 98.4 F (36.9 C), height 6\' 1"  (1.854 m), weight 222 lb (100.7 kg), SpO2 95 %.  General appearance: alert and no distress Neck: no adenopathy, no carotid bruit, no JVD, supple, symmetrical, trachea midline and thyroid not enlarged, symmetric, no tenderness/mass/nodules Lungs: clear to auscultation bilaterally Heart: regular rate and rhythm, S1, S2 normal, no murmur, click, rub or gallop Extremities: extremities normal, atraumatic, no cyanosis or edema Pulses: 2+ and symmetric Skin: Skin color, texture, turgor normal. No rashes or lesions Neurologic: Alert and oriented X 3, normal strength and tone. Normal symmetric reflexes. Normal coordination and gait  EKG not performed today  ASSESSMENT AND PLAN:   Acute on chronic diastolic CHF (congestive heart failure) [facility]) Mr. Grudzien returns today for follow-up of shortness of breath.  He does have preserved LV function status post recent TAVR.  His BNP was elevated at 500.  I saw him virtually 01/02/2019 and increase his torsemide from 20 twice daily to 20 the morning and 40 in the evening.  His follow-up serum creatinine was 1.58.  He has no peripheral edema today.  His saturations are 95% today on room air.  His lungs are clear.  At this point, I think he is dry.  I do not think that he is in failure today.      [name] MD FACP,FACC,FAHA, [facility] 02/27/2019 12:37 PM

## 2019-02-27 NOTE — Patient Instructions (Signed)
Medication Instructions:  Your physician recommends that you continue on your current medications as directed. Please refer to the Current Medication list given to you today.  If you need a refill on your cardiac medications before your next appointment, please call your pharmacy.   Lab work: NONE If you have labs (blood work) drawn today and your tests are completely normal, you will receive your results only by: [name] MyChart Message (if you have MyChart) OR . A paper copy in the mail If you have any lab test that is abnormal or we need to change your treatment, we will call you to review the results.  Testing/Procedures: NONE  Follow-Up: At [facility], you and your health needs are our priority.  As part of our continuing mission to provide you with exceptional heart care, we have created designated Provider Care Teams.  These Care Teams include your primary Cardiologist (physician) and Advanced Practice Providers (APPs -  Physician Assistants and Nurse Practitioners) who all work together to provide you with the care you need, when you need it. You will need a follow up appointment in 3 months WITH AN APP AND IN 6 MONTHS WITH DR. [name].  Please call our office 2 months in advance to schedule this appointment.

## 2019-02-27 NOTE — Assessment & Plan Note (Signed)
Charles [name] returns today for follow-up of shortness of breath.  He does have preserved LV function status post recent TAVR.  His BNP was elevated at 500.  I saw him virtually 01/02/2019 and increase his torsemide from 20 twice daily to 20 the morning and 40 in the evening.  His follow-up serum creatinine was 1.58.  He has no peripheral edema today.  His saturations are 95% today on room air.  His lungs are clear.  At this point, I think he is dry.  I do not think that he is in failure today.

## 2019-02-28 ENCOUNTER — Encounter: Payer: Self-pay | Admitting: Family Medicine

## 2019-02-28 ENCOUNTER — Telehealth (INDEPENDENT_AMBULATORY_CARE_PROVIDER_SITE_OTHER): Payer: Medicare Other | Admitting: Family Medicine

## 2019-02-28 VITALS — BP 116/60 | HR 69 | Ht 73.0 in

## 2019-02-28 DIAGNOSIS — I4892 Unspecified atrial flutter: Secondary | ICD-10-CM

## 2019-02-28 DIAGNOSIS — E7800 Pure hypercholesterolemia, unspecified: Secondary | ICD-10-CM | POA: Diagnosis not present

## 2019-02-28 DIAGNOSIS — I350 Nonrheumatic aortic (valve) stenosis: Secondary | ICD-10-CM

## 2019-02-28 DIAGNOSIS — Z952 Presence of prosthetic heart valve: Secondary | ICD-10-CM

## 2019-02-28 DIAGNOSIS — N182 Chronic kidney disease, stage 2 (mild): Secondary | ICD-10-CM

## 2019-02-28 DIAGNOSIS — Z950 Presence of cardiac pacemaker: Secondary | ICD-10-CM

## 2019-02-28 DIAGNOSIS — I10 Essential (primary) hypertension: Secondary | ICD-10-CM | POA: Diagnosis not present

## 2019-02-28 DIAGNOSIS — E1165 Type 2 diabetes mellitus with hyperglycemia: Secondary | ICD-10-CM

## 2019-02-28 DIAGNOSIS — K219 Gastro-esophageal reflux disease without esophagitis: Secondary | ICD-10-CM

## 2019-02-28 DIAGNOSIS — E118 Type 2 diabetes mellitus with unspecified complications: Secondary | ICD-10-CM

## 2019-02-28 DIAGNOSIS — F331 Major depressive disorder, recurrent, moderate: Secondary | ICD-10-CM | POA: Diagnosis not present

## 2019-02-28 DIAGNOSIS — IMO0002 Reserved for concepts with insufficient information to code with codable children: Secondary | ICD-10-CM

## 2019-02-28 MED ORDER — TRAZODONE HCL 50 MG PO TABS: 50.0000 mg | ORAL_TABLET | Freq: Every day | ORAL | 3 refills | Status: DC

## 2019-02-28 MED ORDER — PANTOPRAZOLE SODIUM 20 MG PO TBEC: DELAYED_RELEASE_TABLET | ORAL | 3 refills | Status: DC

## 2019-02-28 NOTE — Progress Notes (Signed)
Virtual visit completed through [place]. Due to national recommendations of social distancing due to COVID-19, a virtual visit is felt to be most appropriate for this patient at this time.   Patient location: home, DIL also present on call  Provider location: [place] at [facility], office If any vitals were documented, they were collected by patient at home unless specified below.    BP 116/60 (BP Location: Right Arm, Patient Position: Sitting)   Pulse 69   Ht 6\' 1"  (1.854 m)   BMI 29.29 kg/m    CC: AMW f/u visit Subjective:    Patient ID: Charles [name], male    DOB: 1932/06/04, 85 y.o.   MRN: [id number]  HPI: Charles [name] is a 85 y.o. male presenting on 02/28/2019 for Annual Exam (Pt 2. )   Saw Katha Cabal this week for medicare wellness visit. Note reviewed.    Known high grade AV block, severe AS s/p TAVR 12/2018, recurrent atrial flutter, ischemic heart disease s/p bypass, chronic dCHF followed by Dr Caryl Comes with [facility] pacemaker in place. On aspirin and eliquis. Now on torsemide 20/40mg  daily.   DM - on metformin 1000mg  bid and lantus 20u QHS. He is checking sugars regularly QID - this morning fasting 94. Checks fasting, before lunch, before dinner, and at bedtime. Intermittent daytime checks 180-190. CBG's don't correlate to A1c. Advised to send me sugar log readings in 1 week to dose lantus.   MDD - on trazodone 50mg  nightly and celexa.   He has stopped driving.   Preventative: Prostate screening - had screening by [facility] Dill) about 2011 - normal. Aged out. COLONOSCOPY Date: 10/08/2012 2 TA, diverticulosis, int hem, no rpt rec Tiffany Kocher)  ESOPHAGOGASTRODUODENOSCOPY Date: 10/08/2012 nl esophagus, duodenitis and erosive gastropathy, path - gastropathy no Hpylori Flu shot yearly Td 2006. Pneumovax 2004, prevnar 2015 Zostavax/shingrix - discussed. has had shingles illness x 2.  Advanced directives: Unsure about HCPOA. Doesn't want prolonged life support. Seat belt use  discussed Sunscreen use discussed. No changing moles on skin.  Non smoker Alcohol - none Dentist - has dentures Eye exam - overdue Bowel - no constipation Bladder - wears depens. Notes increased frequency. Nocturia x4.  No recent falls - uses walker regularly at home.   Caffeine: 4 cups coffee/day, some tea and soda  Widower - lives alone in apt - family lives 15 min away - they check in on him daily  Occupation: Retired  Activity: no regular exercise  Diet: good water, fruits/vegetables daily     Relevant past medical, surgical, family and social history reviewed and updated as indicated. Interim medical history since our last visit reviewed. Allergies and medications reviewed and updated. Outpatient Medications Prior to Visit  Medication Sig Dispense Refill  . acetaminophen (TYLENOL) 500 MG tablet Take 1 tablet (500 mg total) by mouth 3 (three) times daily.    [name] atorvastatin (LIPITOR) 20 MG tablet Take 1 tablet (20 mg total) by mouth daily. 90 tablet 3  . BD INSULIN SYRINGE U/F 30G X 1/2" 0.5 ML MISC AS DIRECTED. 100 each 0  . citalopram (CELEXA) 20 MG tablet Take 1 tablet (20 mg total) by mouth daily. 90 tablet 3  . ELIQUIS 5 MG TABS tablet Take 1 tablet (5 mg total) by mouth 2 (two) times daily. 60 tablet 3  . finasteride (PROSCAR) 5 MG tablet Take 1 tablet (5 mg total) by mouth daily. 30 tablet 2  . glucose blood (ACCU-CHEK AVIVA PLUS) test strip 1 each by  Other route as needed for other. Use as instructed to check blood sugar 3 (three) times a day.  Dx code:  E11.8, E11.65 300 each 0  . guaiFENesin (MUCINEX) 600 MG 12 hr tablet Take 600 mg by mouth 2 (two) times daily as needed for cough or to loosen phlegm.    [name] LANTUS 100 UNIT/ML injection Inject 0.2 mLs (20 Units total) into the skin at bedtime. 10 mL 2  . lisinopril (PRINIVIL,ZESTRIL) 40 MG tablet Take 1 tablet (40 mg total) by mouth daily. 30 tablet 11  . metFORMIN (GLUCOPHAGE) 1000 MG tablet Take 1 tablet (1,000 mg  total) by mouth 2 (two) times daily with a meal. 180 tablet 0  . nitroGLYCERIN (NITROSTAT) 0.4 MG SL tablet Place 1 tablet (0.4 mg total) under the tongue every 5 (five) minutes as needed for chest pain. 90 tablet 3  . Potassium Chloride ER 20 MEQ TBCR Take 20 mEq by mouth every Monday, Wednesday, Friday, Saturday, and Sunday at 6 PM. 30 tablet 5  . tamsulosin (FLOMAX) 0.4 MG CAPS capsule Take 1 capsule (0.4 mg total) by mouth daily after supper. 30 capsule 6  . torsemide (DEMADEX) 20 MG tablet Take 20 mg by mouth every morning. & 40 mg in the evening    . pantoprazole (PROTONIX) 20 MG tablet Take 1 tablet (20 mg total) by mouth daily. (Patient taking differently: Take 20 mg by mouth daily. ) 30 tablet 6  . traZODone (DESYREL) 50 MG tablet Take 2 tablets (100 mg total) by mouth at bedtime. 180 tablet 0   No facility-administered medications prior to visit.      Per HPI unless specifically indicated in ROS section below Review of Systems Objective:    BP 116/60 (BP Location: Right Arm, Patient Position: Sitting)   Pulse 69   Ht 6\' 1"  (1.854 m)   BMI 29.29 kg/m   Wt Readings from Last 3 Encounters:  02/27/19 222 lb (100.7 kg)  01/26/19 224 lb 12.8 oz (102 kg)  01/02/19 213 lb 12.8 oz (97 kg)     Physical exam: Gen: alert, NAD, not ill appearing Pulm: speaks in complete sentences without increased work of breathing Psych: normal mood, normal thought content      Results for orders placed or performed in visit on 02/23/19  CBC with Differential/Platelet  Result Value Ref Range   WBC 5.5 4.0 - 10.5 K/uL   RBC 4.13 (L) 4.22 - 5.81 Mil/uL   Hemoglobin 12.1 (L) 13.0 - 17.0 g/dL   HCT 36.3 (L) 39.0 - 52.0 %   MCV 87.8 78.0 - 100.0 fl   MCHC 33.3 30.0 - 36.0 g/dL   RDW 14.3 11.5 - 15.5 %   Platelets 156.0 150.0 - 400.0 K/uL   Neutrophils Relative % 67.7 43.0 - 77.0 %   Lymphocytes Relative 17.2 12.0 - 46.0 %   Monocytes Relative 10.5 3.0 - 12.0 %   Eosinophils Relative 3.5 0.0 -  5.0 %   Basophils Relative 1.1 0.0 - 3.0 %   Neutro Abs 3.7 1.4 - 7.7 K/uL   Lymphs Abs 0.9 0.7 - 4.0 K/uL   Monocytes Absolute 0.6 0.1 - 1.0 K/uL   Eosinophils Absolute 0.2 0.0 - 0.7 K/uL   Basophils Absolute 0.1 0.0 - 0.1 K/uL  Hemoglobin A1c  Result Value Ref Range   Hgb A1c MFr Bld 11.0 (H) 4.6 - 6.5 %  Comprehensive metabolic panel  Result Value Ref Range   Sodium 140 135 - 145 mEq/L  Potassium 4.9 3.5 - 5.1 mEq/L   Chloride 102 96 - 112 mEq/L   CO2 28 19 - 32 mEq/L   Glucose, Bld 218 (H) 70 - 99 mg/dL   BUN 43 (H) 6 - 23 mg/dL   Creatinine, Ser 1.58 (H) 0.40 - 1.50 mg/dL   Total Bilirubin 0.3 0.2 - 1.2 mg/dL   Alkaline Phosphatase 67 39 - 117 U/L   AST 14 0 - 37 U/L   ALT 12 0 - 53 U/L   Total Protein 6.8 6.0 - 8.3 g/dL   Albumin 3.8 3.5 - 5.2 g/dL   Calcium 9.3 8.4 - 10.5 mg/dL   GFR 41.94 (L) >60.00 mL/min  Lipid panel  Result Value Ref Range   Cholesterol 137 0 - 200 mg/dL   Triglycerides 283.0 (H) 0.0 - 149.0 mg/dL   HDL 30.40 (L) >39.00 mg/dL   VLDL 56.6 (H) 0.0 - 40.0 mg/dL   Total CHOL/HDL Ratio 5    NonHDL 106.48   LDL cholesterol, direct  Result Value Ref Range   Direct LDL 75.0 mg/dL   Assessment & Plan:   Problem List Items Addressed This Visit    S/P TAVR (transcatheter aortic valve replacement)   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 [employer]   Paroxysmal atrial flutter (HCC)    Continue eliquis.       MDD (major depressive disorder), recurrent episode, moderate (HCC)    Chronic, stable period- continue celexa and 50mg  trazodone at night.       Relevant Medications   traZODone (DESYREL) 50 MG tablet   HYPERCHOLESTEROLEMIA    LDL 75 on atorvastatin - continue. The ASCVD Risk score Mikey Bussing DC Jr., et al., 2013) failed to calculate for the following reasons:   The 2013 ASCVD risk score is only valid for ages 58 to 64       GERD    Continues daily low dose PPI.       Relevant Medications   pantoprazole (PROTONIX) 20 MG tablet    Essential hypertension    Chronic, stable. Continue current regimen.       Diabetes mellitus type 2, uncontrolled, with complications ([place]) - Primary    Marked deterioration in control based on A1c however recall cbg's largely ok (90s fasting, high 100s during the day). cbg's don't correlate with A1c reading. Consider fructosamine next labs. I have asked them to bring me sugar log in 1 week to review in detail then determine need for insulin titration. Pt and DIL agree with plan.       CKD (chronic kidney disease), stage II    Reviewed kidney function with patient. Difficult situation with concomitant CHF.       Aortic stenosis, severe    S/p TAVR 12/2018          Meds ordered this encounter  Medications  . traZODone (DESYREL) 50 MG tablet    Sig: Take 1 tablet (50 mg total) by mouth at bedtime.    Dispense:  90 tablet    Refill:  3  . pantoprazole (PROTONIX) 20 MG tablet    Sig: Take 1 tablet (20 mg total) by mouth daily.    Dispense:  90 tablet    Refill:  3   No orders of the defined types were placed in this encounter.   Follow up plan: Return in about 3 months (around 05/31/2019) for follow up visit.  [name], MD

## 2019-02-28 NOTE — Assessment & Plan Note (Signed)
S/p TAVR 12/2018

## 2019-02-28 NOTE — Assessment & Plan Note (Signed)
Reviewed kidney function with patient. Difficult situation with concomitant CHF.

## 2019-02-28 NOTE — Assessment & Plan Note (Signed)
Marked deterioration in control based on A1c however recall cbg's largely ok (90s fasting, high 100s during the day). cbg's don't correlate with A1c reading. Consider fructosamine next labs. I have asked them to bring me sugar log in 1 week to review in detail then determine need for insulin titration. Pt and DIL agree with plan.

## 2019-02-28 NOTE — Assessment & Plan Note (Signed)
LDL 75 on atorvastatin - continue. The ASCVD Risk score Charles Bussing DC Jr., et al., 2013) failed to calculate for the following reasons:   The 2013 ASCVD risk score is only valid for ages 22 to 70

## 2019-02-28 NOTE — Assessment & Plan Note (Signed)
Continue eliquis  ?

## 2019-02-28 NOTE — Assessment & Plan Note (Signed)
Continues daily low dose PPI.

## 2019-02-28 NOTE — Assessment & Plan Note (Signed)
Chronic, stable period- continue celexa and 50mg  trazodone at night.

## 2019-02-28 NOTE — Assessment & Plan Note (Signed)
Chronic, stable. Continue current regimen. 

## 2019-03-05 ENCOUNTER — Other Ambulatory Visit: Payer: Self-pay | Admitting: Family Medicine

## 2019-03-08 ENCOUNTER — Telehealth: Payer: Self-pay | Admitting: Nurse Practitioner

## 2019-03-08 NOTE — Telephone Encounter (Signed)
[facility] alert for persistent atrial arrhythmias. See phone note 02/21/19. Pt currently on Eliquis and being treated for acute on chronic heart failure. Will ask AF clinic to see patient to discuss options.      Chanetta Marshall, NP 03/08/2019 8:54 AM

## 2019-03-08 NOTE — Telephone Encounter (Signed)
Left message to return my call to offer appointment.

## 2019-03-09 ENCOUNTER — Telehealth: Payer: Self-pay | Admitting: Cardiovascular Disease

## 2019-03-09 NOTE — Telephone Encounter (Signed)
He has needed cardivoersion 2016,18  It seems like it is time again

## 2019-03-09 NOTE — Telephone Encounter (Signed)
LMTCB

## 2019-03-09 NOTE — Telephone Encounter (Signed)
Ariella from [employer] called to report patient is feeling more SOB. His O2 is 93%, he seems ok when he was on the phone with them, be his is still having issues with SOB off and on.

## 2019-03-13 NOTE — Telephone Encounter (Signed)
Left message for pt to call.

## 2019-03-15 ENCOUNTER — Encounter: Payer: Self-pay | Admitting: Family Medicine

## 2019-03-15 ENCOUNTER — Telehealth: Payer: Self-pay | Admitting: Cardiovascular Disease

## 2019-03-15 NOTE — Telephone Encounter (Signed)
Follow Up:; ° ° °Returning your call. °

## 2019-03-15 NOTE — Telephone Encounter (Signed)
Left message for pt to call.

## 2019-03-15 NOTE — Telephone Encounter (Signed)
Spoke with pt dtr, she reports there patient is having SOB with lying flat and he does have some swelling in his feet. She is not sure he weighs everyday. They have increased his torsemide starting today to 40 mg twice daily. She is on vacation but talked to him this morning and reports he sounded fine. They will call us back if the increase in torsemide does not help his symptoms.

## 2019-03-16 ENCOUNTER — Encounter (HOSPITAL_COMMUNITY): Payer: Self-pay

## 2019-03-16 ENCOUNTER — Telehealth: Payer: Self-pay | Admitting: Cardiovascular Disease

## 2019-03-16 NOTE — Telephone Encounter (Signed)
Pt prefers to see dr Caryl Comes - virtual visit set for 6/16 with Dr. Caryl Comes. Daughter in law aware.

## 2019-03-16 NOTE — Telephone Encounter (Signed)
Per Rn's call from [facility]     160/73 175/71  165/83 am before med.  144/76 pm  Pt c/o BP issue: STAT if pt c/o blurred vision, one-sided weakness or slurred speech  1. What are your last 5 BP readings?  6/8    137/73  11a   6/9     142/76  11a 6/10   160/73 10 am 6/11   175/7   1a 6/12  165/83 before med     144/76  2. Are you having any other symptoms (ex. Dizziness, headache, blurred vision, passed out)?  Not feeling good no, energy , SOB worse,  93% Oxygen saturation , and feels like he is having a chest cold  3. What is your BP issue? HIGH /SOB

## 2019-03-16 NOTE — Telephone Encounter (Signed)
Called patient he states his BP was high; yesterday normally 140/70ish, HR 69's. States today it was 185 for the top number.  Mentions being SOB, denies chest pain and swelling in hands/feet.   Continued to take medications as prescribed. Denies changes in diet, no excess salt.  Patient is unable to come into office for an appointment.   Overall patient is doing well.

## 2019-03-16 NOTE — Telephone Encounter (Signed)
No change to BP meds for now, has a virtual visit with EP on Tuesday, they can prescribe based on what they see.

## 2019-03-16 NOTE — Telephone Encounter (Signed)
Attempted to contact patient back, did not answer, no voicemail to leave message.

## 2019-03-18 ENCOUNTER — Telehealth: Payer: Self-pay | Admitting: Internal Medicine

## 2019-03-18 NOTE — Telephone Encounter (Signed)
Cardiology Moonlighter Note  Return page from patient's daughter.  Patient has been experiencing worsening shortness of breath, orthopnea, PND, and lower extremity edema over the past 1 to 2 weeks.  He has had multiple episodes in the past of acute decompensated heart failure.  Patient believes that this episode is typical of his prior episodes of heart failure.  Typically takes 40 mg of torsemide in the morning and 20 mg at night.  For the past several days he has been taking 40 mg twice daily but has had no relief from his symptoms.  Denies chest pain, palpitations, syncope.  Given that he has tried a higher dose of torsemide at home with no relief, I recommended coming to the emergency department for evaluation and likely admission for IV diuretics in the setting of acute decompensated heart failure.  Marcie Mowers, MD Cardiology Fellow, PGY-6

## 2019-03-19 ENCOUNTER — Emergency Department (HOSPITAL_COMMUNITY): Payer: Medicare Other

## 2019-03-19 ENCOUNTER — Encounter (HOSPITAL_COMMUNITY): Payer: Self-pay | Admitting: Emergency Medicine

## 2019-03-19 ENCOUNTER — Other Ambulatory Visit: Payer: Self-pay

## 2019-03-19 ENCOUNTER — Inpatient Hospital Stay (HOSPITAL_COMMUNITY)
Admission: EM | Admit: 2019-03-19 | Discharge: 2019-03-22 | DRG: 291 | Disposition: A | Discharge status: Home Health | Payer: Medicare Other | Attending: Cardiology | Admitting: Cardiology

## 2019-03-19 DIAGNOSIS — I739 Peripheral vascular disease, unspecified: Secondary | ICD-10-CM | POA: Diagnosis not present

## 2019-03-19 DIAGNOSIS — Z950 Presence of cardiac pacemaker: Secondary | ICD-10-CM | POA: Diagnosis not present

## 2019-03-19 DIAGNOSIS — Z952 Presence of prosthetic heart valve: Secondary | ICD-10-CM

## 2019-03-19 DIAGNOSIS — I2510 Atherosclerotic heart disease of native coronary artery without angina pectoris: Secondary | ICD-10-CM | POA: Diagnosis present

## 2019-03-19 DIAGNOSIS — I5033 Acute on chronic diastolic (congestive) heart failure: Secondary | ICD-10-CM | POA: Diagnosis not present

## 2019-03-19 DIAGNOSIS — E1122 Type 2 diabetes mellitus with diabetic chronic kidney disease: Secondary | ICD-10-CM | POA: Diagnosis not present

## 2019-03-19 DIAGNOSIS — Z79899 Other long term (current) drug therapy: Secondary | ICD-10-CM

## 2019-03-19 DIAGNOSIS — I440 Atrioventricular block, first degree: Secondary | ICD-10-CM | POA: Diagnosis not present

## 2019-03-19 DIAGNOSIS — Z7901 Long term (current) use of anticoagulants: Secondary | ICD-10-CM | POA: Diagnosis not present

## 2019-03-19 DIAGNOSIS — E1151 Type 2 diabetes mellitus with diabetic peripheral angiopathy without gangrene: Secondary | ICD-10-CM | POA: Diagnosis present

## 2019-03-19 DIAGNOSIS — Z823 Family history of stroke: Secondary | ICD-10-CM

## 2019-03-19 DIAGNOSIS — Z8719 Personal history of other diseases of the digestive system: Secondary | ICD-10-CM

## 2019-03-19 DIAGNOSIS — I701 Atherosclerosis of renal artery: Secondary | ICD-10-CM | POA: Diagnosis present

## 2019-03-19 DIAGNOSIS — N183 Chronic kidney disease, stage 3 (moderate): Secondary | ICD-10-CM | POA: Diagnosis not present

## 2019-03-19 DIAGNOSIS — Z951 Presence of aortocoronary bypass graft: Secondary | ICD-10-CM

## 2019-03-19 DIAGNOSIS — Z888 Allergy status to other drugs, medicaments and biological substances status: Secondary | ICD-10-CM

## 2019-03-19 DIAGNOSIS — I471 Supraventricular tachycardia: Secondary | ICD-10-CM | POA: Diagnosis present

## 2019-03-19 DIAGNOSIS — Z833 Family history of diabetes mellitus: Secondary | ICD-10-CM

## 2019-03-19 DIAGNOSIS — Z8249 Family history of ischemic heart disease and other diseases of the circulatory system: Secondary | ICD-10-CM

## 2019-03-19 DIAGNOSIS — M5136 Other intervertebral disc degeneration, lumbar region: Secondary | ICD-10-CM | POA: Diagnosis present

## 2019-03-19 DIAGNOSIS — I4892 Unspecified atrial flutter: Secondary | ICD-10-CM | POA: Diagnosis not present

## 2019-03-19 DIAGNOSIS — J449 Chronic obstructive pulmonary disease, unspecified: Secondary | ICD-10-CM | POA: Diagnosis present

## 2019-03-19 DIAGNOSIS — N182 Chronic kidney disease, stage 2 (mild): Secondary | ICD-10-CM | POA: Diagnosis not present

## 2019-03-19 DIAGNOSIS — R0902 Hypoxemia: Secondary | ICD-10-CM | POA: Diagnosis not present

## 2019-03-19 DIAGNOSIS — I4891 Unspecified atrial fibrillation: Secondary | ICD-10-CM | POA: Diagnosis not present

## 2019-03-19 DIAGNOSIS — F329 Major depressive disorder, single episode, unspecified: Secondary | ICD-10-CM | POA: Diagnosis present

## 2019-03-19 DIAGNOSIS — I130 Hypertensive heart and chronic kidney disease with heart failure and stage 1 through stage 4 chronic kidney disease, or unspecified chronic kidney disease: Secondary | ICD-10-CM | POA: Diagnosis not present

## 2019-03-19 DIAGNOSIS — N3941 Urge incontinence: Secondary | ICD-10-CM | POA: Diagnosis present

## 2019-03-19 DIAGNOSIS — E1165 Type 2 diabetes mellitus with hyperglycemia: Secondary | ICD-10-CM | POA: Diagnosis not present

## 2019-03-19 DIAGNOSIS — Z794 Long term (current) use of insulin: Secondary | ICD-10-CM | POA: Diagnosis not present

## 2019-03-19 DIAGNOSIS — I110 Hypertensive heart disease with heart failure: Secondary | ICD-10-CM | POA: Diagnosis not present

## 2019-03-19 DIAGNOSIS — Z6828 Body mass index (BMI) 28.0-28.9, adult: Secondary | ICD-10-CM

## 2019-03-19 DIAGNOSIS — Z8711 Personal history of peptic ulcer disease: Secondary | ICD-10-CM

## 2019-03-19 DIAGNOSIS — Z87891 Personal history of nicotine dependence: Secondary | ICD-10-CM

## 2019-03-19 DIAGNOSIS — Z953 Presence of xenogenic heart valve: Secondary | ICD-10-CM

## 2019-03-19 DIAGNOSIS — E118 Type 2 diabetes mellitus with unspecified complications: Secondary | ICD-10-CM | POA: Diagnosis not present

## 2019-03-19 DIAGNOSIS — Z20828 Contact with and (suspected) exposure to other viral communicable diseases: Secondary | ICD-10-CM | POA: Diagnosis present

## 2019-03-19 DIAGNOSIS — E785 Hyperlipidemia, unspecified: Secondary | ICD-10-CM | POA: Diagnosis not present

## 2019-03-19 DIAGNOSIS — I495 Sick sinus syndrome: Secondary | ICD-10-CM | POA: Diagnosis present

## 2019-03-19 DIAGNOSIS — Z800 Family history of malignant neoplasm of digestive organs: Secondary | ICD-10-CM

## 2019-03-19 DIAGNOSIS — Z1159 Encounter for screening for other viral diseases: Secondary | ICD-10-CM | POA: Diagnosis not present

## 2019-03-19 DIAGNOSIS — E669 Obesity, unspecified: Secondary | ICD-10-CM | POA: Diagnosis present

## 2019-03-19 DIAGNOSIS — K219 Gastro-esophageal reflux disease without esophagitis: Secondary | ICD-10-CM | POA: Diagnosis not present

## 2019-03-19 DIAGNOSIS — R0602 Shortness of breath: Secondary | ICD-10-CM | POA: Diagnosis not present

## 2019-03-19 DIAGNOSIS — I5032 Chronic diastolic (congestive) heart failure: Secondary | ICD-10-CM | POA: Diagnosis present

## 2019-03-19 DIAGNOSIS — Z801 Family history of malignant neoplasm of trachea, bronchus and lung: Secondary | ICD-10-CM

## 2019-03-19 DIAGNOSIS — IMO0002 Reserved for concepts with insufficient information to code with codable children: Secondary | ICD-10-CM | POA: Diagnosis present

## 2019-03-19 DIAGNOSIS — L409 Psoriasis, unspecified: Secondary | ICD-10-CM | POA: Diagnosis present

## 2019-03-19 LAB — CBC WITH DIFFERENTIAL/PLATELET
Abs Immature Granulocytes: 0.04 10*3/uL (ref 0.00–0.07)
Basophils Absolute: 0 10*3/uL (ref 0.0–0.1)
Basophils Relative: 1 %
Eosinophils Absolute: 0.2 10*3/uL (ref 0.0–0.5)
Eosinophils Relative: 3 %
HCT: 35 % — ABNORMAL LOW (ref 39.0–52.0)
Hemoglobin: 10.7 g/dL — ABNORMAL LOW (ref 13.0–17.0)
Immature Granulocytes: 1 %
Lymphocytes Relative: 14 %
Lymphs Abs: 0.9 10*3/uL (ref 0.7–4.0)
MCH: 28.4 pg (ref 26.0–34.0)
MCHC: 30.6 g/dL (ref 30.0–36.0)
MCV: 92.8 fL (ref 80.0–100.0)
Monocytes Absolute: 0.7 10*3/uL (ref 0.1–1.0)
Monocytes Relative: 11 %
Neutro Abs: 4.5 10*3/uL (ref 1.7–7.7)
Neutrophils Relative %: 70 %
Platelets: 142 10*3/uL — ABNORMAL LOW (ref 150–400)
RBC: 3.77 MIL/uL — ABNORMAL LOW (ref 4.22–5.81)
RDW: 14 % (ref 11.5–15.5)
WBC: 6.4 10*3/uL (ref 4.0–10.5)
nRBC: 0 % (ref 0.0–0.2)

## 2019-03-19 LAB — COMPREHENSIVE METABOLIC PANEL
ALT: 20 U/L (ref 0–44)
AST: 17 U/L (ref 15–41)
Albumin: 3.8 g/dL (ref 3.5–5.0)
Alkaline Phosphatase: 86 U/L (ref 38–126)
Anion gap: 12 (ref 5–15)
BUN: 29 mg/dL — ABNORMAL HIGH (ref 8–23)
CO2: 25 mmol/L (ref 22–32)
Calcium: 9.2 mg/dL (ref 8.9–10.3)
Chloride: 101 mmol/L (ref 98–111)
Creatinine, Ser: 1.68 mg/dL — ABNORMAL HIGH (ref 0.61–1.24)
GFR calc Af Amer: 43 mL/min — ABNORMAL LOW (ref >60)
GFR calc non Af Amer: 37 mL/min — ABNORMAL LOW (ref >60)
Glucose, Bld: 312 mg/dL — ABNORMAL HIGH (ref 70–99)
Potassium: 4.8 mmol/L (ref 3.5–5.1)
Sodium: 138 mmol/L (ref 135–145)
Total Bilirubin: 0.4 mg/dL (ref 0.3–1.2)
Total Protein: 6.9 g/dL (ref 6.5–8.1)

## 2019-03-19 LAB — GLUCOSE, CAPILLARY: Glucose-Capillary: 220 mg/dL — ABNORMAL HIGH (ref 70–99)

## 2019-03-19 LAB — MRSA PCR SCREENING: MRSA by PCR: NEGATIVE

## 2019-03-19 LAB — MAGNESIUM: Magnesium: 1.7 mg/dL (ref 1.7–2.4)

## 2019-03-19 LAB — TSH: TSH: 1.707 u[IU]/mL (ref 0.350–4.500)

## 2019-03-19 LAB — TROPONIN I: Troponin I: 0.05 ng/mL (ref <0.03)

## 2019-03-19 LAB — SARS CORONAVIRUS 2: SARS Coronavirus 2: NOT DETECTED

## 2019-03-19 LAB — BRAIN NATRIURETIC PEPTIDE: B Natriuretic Peptide: 419.9 pg/mL — ABNORMAL HIGH (ref 0.0–100.0)

## 2019-03-19 MED ORDER — FUROSEMIDE 10 MG/ML IJ SOLN
80.0000 mg | Freq: Two times a day (BID) | INTRAMUSCULAR | Status: DC
  Administered 2019-03-19 – 2019-03-21 (×4): 80 mg via INTRAVENOUS
  Filled 2019-03-19 (×4): qty 8

## 2019-03-19 MED ORDER — INSULIN GLARGINE 100 UNIT/ML ~~LOC~~ SOLN
5.0000 [IU] | Freq: Every day | SUBCUTANEOUS | Status: DC
  Administered 2019-03-19 – 2019-03-21 (×3): 5 [IU] via SUBCUTANEOUS
  Filled 2019-03-19 (×4): qty 0.05

## 2019-03-19 MED ORDER — POTASSIUM CHLORIDE CRYS ER 10 MEQ PO TBCR
10.0000 meq | EXTENDED_RELEASE_TABLET | Freq: Two times a day (BID) | ORAL | Status: DC
  Administered 2019-03-19 – 2019-03-22 (×6): 10 meq via ORAL
  Filled 2019-03-19 (×6): qty 1

## 2019-03-19 MED ORDER — PANTOPRAZOLE SODIUM 40 MG PO TBEC
40.0000 mg | DELAYED_RELEASE_TABLET | Freq: Every day | ORAL | Status: DC
  Administered 2019-03-20 – 2019-03-22 (×3): 40 mg via ORAL
  Filled 2019-03-19 (×3): qty 1

## 2019-03-19 MED ORDER — ONDANSETRON HCL 4 MG/2ML IJ SOLN: 4.0000 mg | Freq: Four times a day (QID) | INTRAMUSCULAR | Status: DC | PRN

## 2019-03-19 MED ORDER — FINASTERIDE 5 MG PO TABS
5.0000 mg | ORAL_TABLET | Freq: Every day | ORAL | Status: DC
  Administered 2019-03-20 – 2019-03-22 (×3): 5 mg via ORAL
  Filled 2019-03-19 (×3): qty 1

## 2019-03-19 MED ORDER — ATORVASTATIN CALCIUM 10 MG PO TABS
20.0000 mg | ORAL_TABLET | Freq: Every day | ORAL | Status: DC
  Administered 2019-03-20 – 2019-03-22 (×3): 20 mg via ORAL
  Filled 2019-03-19 (×3): qty 2

## 2019-03-19 MED ORDER — ACETAMINOPHEN 500 MG PO TABS
500.0000 mg | ORAL_TABLET | Freq: Three times a day (TID) | ORAL | Status: DC
  Administered 2019-03-19 – 2019-03-22 (×8): 500 mg via ORAL
  Filled 2019-03-19 (×8): qty 1

## 2019-03-19 MED ORDER — GUAIFENESIN ER 600 MG PO TB12: 600.0000 mg | ORAL_TABLET | Freq: Two times a day (BID) | ORAL | Status: DC | PRN

## 2019-03-19 MED ORDER — NITROGLYCERIN 0.4 MG SL SUBL: 0.4000 mg | SUBLINGUAL_TABLET | SUBLINGUAL | Status: DC | PRN

## 2019-03-19 MED ORDER — ACETAMINOPHEN 325 MG PO TABS: 650.0000 mg | ORAL_TABLET | ORAL | Status: DC | PRN

## 2019-03-19 MED ORDER — FUROSEMIDE 10 MG/ML IJ SOLN
40.0000 mg | Freq: Once | INTRAMUSCULAR | Status: AC
  Administered 2019-03-19: 10:00:00 40 mg via INTRAVENOUS
  Filled 2019-03-19: qty 4

## 2019-03-19 MED ORDER — TRAZODONE HCL 50 MG PO TABS
50.0000 mg | ORAL_TABLET | Freq: Every day | ORAL | Status: DC
  Administered 2019-03-19 – 2019-03-21 (×3): 50 mg via ORAL
  Filled 2019-03-19 (×3): qty 1

## 2019-03-19 MED ORDER — ASPIRIN 81 MG PO CHEW
324.0000 mg | CHEWABLE_TABLET | Freq: Once | ORAL | Status: AC
  Administered 2019-03-19: 324 mg via ORAL
  Filled 2019-03-19: qty 4

## 2019-03-19 MED ORDER — SODIUM CHLORIDE 0.9% FLUSH
3.0000 mL | Freq: Two times a day (BID) | INTRAVENOUS | Status: DC
  Administered 2019-03-19 – 2019-03-20 (×2): 3 mL via INTRAVENOUS

## 2019-03-19 MED ORDER — SODIUM CHLORIDE 0.9% FLUSH: 3.0000 mL | INTRAVENOUS | Status: DC | PRN

## 2019-03-19 MED ORDER — ASPIRIN EC 81 MG PO TBEC
81.0000 mg | DELAYED_RELEASE_TABLET | Freq: Every day | ORAL | Status: DC
  Administered 2019-03-20 – 2019-03-22 (×3): 81 mg via ORAL
  Filled 2019-03-19 (×3): qty 1

## 2019-03-19 MED ORDER — APIXABAN 5 MG PO TABS
5.0000 mg | ORAL_TABLET | Freq: Two times a day (BID) | ORAL | Status: DC
  Administered 2019-03-19 – 2019-03-22 (×6): 5 mg via ORAL
  Filled 2019-03-19 (×6): qty 1

## 2019-03-19 MED ORDER — LISINOPRIL 20 MG PO TABS
40.0000 mg | ORAL_TABLET | Freq: Every day | ORAL | Status: DC
  Administered 2019-03-20 – 2019-03-22 (×3): 40 mg via ORAL
  Filled 2019-03-19 (×3): qty 2

## 2019-03-19 MED ORDER — SODIUM CHLORIDE 0.9 % IV SOLN: 250.0000 mL | INTRAVENOUS | Status: DC | PRN

## 2019-03-19 MED ORDER — CITALOPRAM HYDROBROMIDE 20 MG PO TABS
20.0000 mg | ORAL_TABLET | Freq: Every day | ORAL | Status: DC
  Administered 2019-03-20 – 2019-03-22 (×3): 20 mg via ORAL
  Filled 2019-03-19 (×3): qty 1

## 2019-03-19 MED ORDER — TAMSULOSIN HCL 0.4 MG PO CAPS
0.4000 mg | ORAL_CAPSULE | Freq: Every day | ORAL | Status: DC
  Administered 2019-03-20 – 2019-03-21 (×2): 0.4 mg via ORAL
  Filled 2019-03-19 (×2): qty 1

## 2019-03-19 NOTE — ED Notes (Signed)
Spoke with family and gave update for pt plan of care.

## 2019-03-19 NOTE — ED Notes (Signed)
Lunch tray ordered 

## 2019-03-19 NOTE — ED Provider Notes (Signed)
[facility] EMERGENCY DEPARTMENT Provider Note   CSN: [id number] Arrival date & time: 03/19/19  3710    History   Chief Complaint Chief Complaint  Patient presents with   Congestive Heart Failure   Leg Swelling    HPI Charles [name] is a 85 y.o. male.     HPI   Patient is an 85 year old male with a history of atrial flutter (on Eliquis), Mobitz 1 AV block, CAD, carotid stenosis, CHF, COPD, hyperlipidemia, hypertension, s/p pacemaker, aortic stenosis s/p TAVR, (12/2018), T2DM, who presents to the emergency department today for evaluation of shortness of breath and bilateral lower leg swelling that have been present for the last 2 weeks.  He also reports his abdomen feels swollen and he has been having orthopnea for the last 2 weeks.  States he has gained about 6 pounds.  He denies any chest pain.  States he has a very mild cough that is intermittent.  No associated fevers.  No known exposures to the coronavirus.  No recent abdominal pain, nausea, vomiting, diarrhea or urinary complaints.  Reviewed records, patient recently in contact with his cardiologist who increased his torsemide a few days ago.  He has had no improvement of his symptoms and was advised to come to the ED for evaluation.  Past Medical History:  Diagnosis Date   Abdominal aortic atherosclerosis ([place])    by xray   Arthritis    in lower back (06/24/2015)   Atrial flutter ([place])    notes 06/24/2015   AV block, Mobitz 1 09/06/2017   Archie Endo 09/06/2017   BRVO (branch retinal vein occlusion) 2015   bilateral [facility])   CAD (coronary artery disease) 2004   a. s/p CABG in 2004 with LIMA-LAD, SVG-RCA, SVG-OM, and SVG-RI   Carotid stenosis 1999   s/p L CEA   Chronic diastolic CHF (congestive heart failure) ([place])    Colon polyp 2005   (Dr. 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   Edwards Sapien 3 THV (size 26 mm, model # 9600TFX, serial # [id number]) via the TF approach   Severe aortic stenosis    T2DM (type 2 diabetes mellitus) ([place]) 1995   Urge incontinence of urine     Patient Active Problem List   Diagnosis Date Noted   CKD (chronic kidney disease), stage II 12/27/2018   Dyspnea 12/26/2018   S/P TAVR (transcatheter aortic valve replacement)    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 [employer] 04/22/2018   Paroxysmal atrial flutter (HCC)    Scrotal swelling 02/16/2018   Coronary artery disease involving native coronary artery of native heart without angina pectoris    Acute on chronic diastolic CHF (congestive heart failure) ([place]) 09/06/2017   Urinary urgency 07/01/2017   Chronic anticoagulation 11/05/2016   Chronic radicular pain of lower back 02/17/2016   Aortic stenosis, severe 06/04/2014   DDD (  degenerative disc disease), lumbar 10/02/2013   Ex-smoker    Carotid arterial disease ([place]) 08/23/2013   MDD (major depressive disorder), recurrent episode, moderate ([place]) 09/21/2007   OSTEOARTHRITIS 09/21/2007   COLONIC POLYPS 01/31/2007   Diabetes mellitus type 2, uncontrolled, with complications ([place]) 26/83/4196   HYPERCHOLESTEROLEMIA 01/31/2007   ERECTILE DYSFUNCTION 01/31/2007   Essential hypertension 01/31/2007   GERD 01/31/2007   PSORIASIS 01/31/2007    Past Surgical History:  Procedure Laterality Date   CARDIAC CATHETERIZATION  03/06/2003   No intervention - recommend  CABG   CARDIOVASCULAR STRESS TEST  11/2010   normal perfusion, no evidence of ischemia, EF 62% post exercise   CARDIOVASCULAR STRESS TEST  11/28/2012   Mild diaphragmatic attenuation; cannot exclude a focal region of nontransmural inferior scar   CARDIOVERSION N/A 06/25/2015   Procedure: CARDIOVERSION;  Surgeon: Pixie Casino, MD;  Location: [facility] ENDOSCOPY;  Service: Cardiovascular;  Laterality: N/A;   CARDIOVERSION N/A 11/02/2016   Procedure: CARDIOVERSION;  Surgeon: [name], MD;  Location: [place];  Service: Cardiovascular;  Laterality: N/A;   CAROTID ENDARTERECTOMY Left 1999   ([place])   CATARACT EXTRACTION W/ INTRAOCULAR LENS  IMPLANT, BILATERAL Bilateral 01/2013   [place]   COLONOSCOPY  2003   colon polyp x3 - adenomatous Tiffany Kocher)   COLONOSCOPY  10/08/2012   2 TA, diverticulosis, int hem, no rpt rec Tiffany Kocher)   CORONARY ANGIOPLASTY     CORONARY ARTERY BYPASS GRAFT  03/07/2003   4v CABG ([place]) with LIMA to LAD, vein graft to RCA, 1st obtuse marginal, and ramus intermedius   ESOPHAGOGASTRODUODENOSCOPY  10/08/2012   nl esophagus, duodenitis and erosive gastropathy, path - gastropathy no Hpylori, no rpt rec   LUMBAR EPIDURAL INJECTION  03/2017   L5/S1 ([place])   PACEMAKER IMPLANT N/A 04/21/2018   Procedure: PACEMAKER IMPLANT -- Dual Chamber;  Surgeon: Deboraha Sprang, MD;  Location: [place] CV LAB;  Service: Cardiovascular;  Laterality: N/A;   PERIPHERAL VASCULAR CATHETERIZATION N/A 02/02/2016   Procedure: Renal Angiography;  Surgeon: [name], MD;  Location: [place] CV LAB;  Service: Cardiovascular;  Laterality: N/A;   PERIPHERAL VASCULAR CATHETERIZATION N/A 02/02/2016   Procedure: Abdominal Aortogram;  Surgeon: [name], MD;  Location: [place] CV LAB;  Service: Cardiovascular;  Laterality: N/A;   RENAL DOPPLER  11/29/2011   Celiac&SMA-demonstrated vessel narrowing suggestive of a greater than 50% diameter reduction. Bilateral renal  arteries-demonstrated vessel narrowing of 60-99% diameter reduction. Rt Kidney-mid pole lateral simple cyst noted measuring 1.29x0.76x1.11cm and exophytic cyst outside lower pole measuring 1.23x0.96x1.31. Lft Kidney-lateral mid to lower pole simple cyst measuering-1.24x9.83x1.24   RIGHT/LEFT HEART CATH AND CORONARY/GRAFT ANGIOGRAPHY N/A 10/20/2017   Procedure: RIGHT/LEFT HEART CATH AND CORONARY/GRAFT ANGIOGRAPHY;  Surgeon: Burnell Blanks, MD;  Location: [place] CV LAB;  Service: Cardiovascular;  Laterality: N/A;   TEE WITHOUT CARDIOVERSION N/A 06/25/2015   Procedure: TRANSESOPHAGEAL ECHOCARDIOGRAM (TEE);  Surgeon: Pixie Casino, MD;  Location[facility] ENDOSCOPY;  Service: Cardiovascular;  Laterality: N/A;   TEE WITHOUT CARDIOVERSION N/A 11/02/2016   Procedure: TRANSESOPHAGEAL ECHOCARDIOGRAM (TEE);  Surgeon: [name], MD;  Location: [place];  Service: Cardiovascular;  Laterality: N/A;   TEE WITHOUT CARDIOVERSION N/A 12/05/2018   Procedure: TRANSESOPHAGEAL ECHOCARDIOGRAM (TEE);  Surgeon: Sherren Mocha, MD;  Location: [place];  Service: Open Heart Surgery;  Laterality: N/A;   TONSILLECTOMY     TRANSCATHETER AORTIC VALVE REPLACEMENT, TRANSFEMORAL N/A 12/05/2018   Procedure: TRANSCATHETER AORTIC VALVE REPLACEMENT, TRANSFEMORAL;  Surgeon: Sherren Mocha, MD;  Location:  [place] OR;  Service: Open Heart Surgery;  Laterality: N/A;   UPPER GASTROINTESTINAL ENDOSCOPY  2003   reflux esophagitis, erosive gastropathy, duodenal ulcer        Home Medications    Prior to Admission medications   Medication Sig Start Date End Date Taking? Authorizing Provider  acetaminophen (TYLENOL) 500 MG tablet Take 1 tablet (500 mg total) by mouth 3 (three) times daily. 02/17/16  Yes [name], MD  atorvastatin (LIPITOR) 20 MG tablet Take 1 tablet (20 mg total) by mouth daily. 08/01/18  Yes [name], MD  citalopram (CELEXA) 20 MG tablet Take 1 tablet (20 mg total) by mouth daily. 08/01/18   Yes [name], MD  ELIQUIS 5 MG TABS tablet Take 1 tablet (5 mg total) by mouth 2 (two) times daily. Patient taking differently: Take 5 mg by mouth 2 (two) times daily.  01/10/19  Yes [name], MD  finasteride (PROSCAR) 5 MG tablet Take 1 tablet (5 mg total) by mouth daily. 12/12/18  Yes [name], MD  guaiFENesin (MUCINEX) 600 MG 12 hr tablet Take 600 mg by mouth 2 (two) times daily as needed for cough or to loosen phlegm.   Yes [provider]  LANTUS 100 UNIT/ML injection Inject 0.2 mLs (20 Units total) into the skin at bedtime. Patient taking differently: Inject 20 Units into the skin at bedtime.  02/19/19  Yes [name], MD  lisinopril (PRINIVIL,ZESTRIL) 40 MG tablet Take 1 tablet (40 mg total) by mouth daily. 12/14/18 12/09/19 Yes Eileen Stanford, PA-C  metFORMIN (GLUCOPHAGE) 1000 MG tablet Take 1 tablet (1,000 mg total) by mouth 2 (two) times daily with a meal. 02/19/19  Yes [name], MD  nitroGLYCERIN (NITROSTAT) 0.4 MG SL tablet Place 1 tablet (0.4 mg total) under the tongue every 5 (five) minutes as needed for chest pain. 03/08/18  Yes Almyra Deforest, PA  pantoprazole (PROTONIX) 20 MG tablet Take 1 tablet (20 mg total) by mouth daily. 02/28/19  Yes [name], MD  Potassium Chloride ER 20 MEQ TBCR Take 20 mEq by mouth every Monday, Wednesday, Friday, Saturday, and Sunday at 6 PM. 10/13/18  Yes [name], MD  tamsulosin (FLOMAX) 0.4 MG CAPS capsule Take 1 capsule (0.4 mg total) by mouth daily after supper. 09/26/18  Yes [name], MD  torsemide (DEMADEX) 20 MG tablet Take 20-40 mg by mouth every morning. Last 4 days starting 03-15-19 taking 40mg  twice daily   Yes [provider]  traZODone (DESYREL) 50 MG tablet Take 1 tablet (50 mg total) by mouth at bedtime. 02/28/19  Yes [name], MD  BD INSULIN SYRINGE U/F 30G X 1/2" 0.5 ML MISC AS DIRECTED. 03/06/19   [name], MD  glucose blood (ACCU-CHEK AVIVA PLUS)  test strip 1 each by Other route as needed for other. Use as instructed to check blood sugar 3 (three) times a day.  Dx code:  E11.8, E11.65 07/10/18   [name], MD    Family History Family History  Problem Relation Age of Onset   Hypertension Father    Heart disease Father    Cancer Mother        GI cancer   Diabetes Sister    Heart disease Sister    Stroke Sister    Hypertension Sister    Cancer Brother        Lung cancer - nonsmoker   Cancer Maternal Grandmother    Heart attack Maternal Grandfather    Heart attack Paternal Grandmother  Heart attack Paternal Grandfather    Cancer Brother        Pancreatic cancer   Lung disease Brother     Social History Social History   Tobacco Use   Smoking status: Former Smoker    Packs/day: 0.50    Years: 36.00    Pack years: 18.00    Types: Cigarettes    Quit date: 07/23/2013    Years since quitting: 5.6   Smokeless tobacco: Never Used  Substance Use Topics   Alcohol use: No   Drug use: No     Allergies   Gabapentin, Metoprolol, Spironolactone, Amlodipine, Other, Rosiglitazone maleate, and Tricor [fenofibrate]   Review of Systems Review of Systems  Constitutional: Negative for fever.  HENT: Negative for ear pain and sore throat.   Eyes: Negative for visual disturbance.  Respiratory: Positive for cough (mild) and shortness of breath. Negative for wheezing.        Orthopnea  Cardiovascular: Positive for leg swelling. Negative for chest pain.  Gastrointestinal: Negative for abdominal pain, constipation, diarrhea, nausea and vomiting.  Genitourinary: Negative for dysuria and hematuria.  Musculoskeletal: Negative for back pain.  Skin: Negative for rash.  Neurological: Negative for headaches.  All other systems reviewed and are negative.    Physical Exam Updated Vital Signs BP 120/62    Pulse 70    Temp 98.8 F (37.1 C) (Oral)    Resp (!) 32    Ht 6\' 1"  (1.854 m)    Wt 101.6 kg    SpO2 98%     BMI 29.55 kg/m   Physical Exam Vitals signs and nursing note reviewed.  Constitutional:      Appearance: He is well-developed.  HENT:     Head: Normocephalic and atraumatic.  Eyes:     Conjunctiva/sclera: Conjunctivae normal.  Neck:     Musculoskeletal: Neck supple.  Cardiovascular:     Rate and Rhythm: Normal rate and regular rhythm.     Pulses: Normal pulses.     Heart sounds: Normal heart sounds. No murmur.  Pulmonary:     Effort: Pulmonary effort is normal.     Comments: Fine crackles to bilat lower lobes. Tachypneic, speaking in short sentences.  Abdominal:     General: Bowel sounds are normal.     Palpations: Abdomen is soft.     Tenderness: There is no abdominal tenderness. There is no guarding or rebound.  Musculoskeletal:     Right lower leg: Edema (1+) present.     Left lower leg: Edema (1+) present.  Skin:    General: Skin is warm and dry.  Neurological:     Mental Status: He is alert.      ED Treatments / Results  Labs (all labs ordered are listed, but only abnormal results are displayed) Labs Reviewed  CBC WITH DIFFERENTIAL/PLATELET - Abnormal; Notable for the following components:      Result Value   RBC 3.77 (*)    Hemoglobin 10.7 (*)    HCT 35.0 (*)    Platelets 142 (*)    All other components within normal limits  COMPREHENSIVE METABOLIC PANEL - Abnormal; Notable for the following components:   Glucose, Bld 312 (*)    BUN 29 (*)    Creatinine, Ser 1.68 (*)    GFR calc non Af Amer 37 (*)    GFR calc Af Amer 43 (*)    All other components within normal limits  BRAIN NATRIURETIC PEPTIDE - Abnormal; Notable for the following components:  B Natriuretic Peptide 419.9 (*)    All other components within normal limits  TROPONIN I - Abnormal; Notable for the following components:   Troponin I 0.05 (*)    All other components within normal limits  SARS CORONAVIRUS 2  SARS CORONAVIRUS 2 (HOSPITAL ORDER, PERFORMED IN [facility] LAB)     EKG EKG Interpretation  Date/Time:  Monday March 19 2019 09:33:39 EDT Ventricular Rate:  73 PR Interval:    QRS Duration: 96 QT Interval:  382 QTC Calculation: 433 R Axis:   15 Text Interpretation:  Ventricular-paced rhythm Artifact Baseline wander ST-t wave abnormality Abnormal ECG Confirmed by Carmin Muskrat [phone]) on 03/19/2019 9:45:50 AM Also confirmed by Carmin Muskrat ([id number]), editor Philomena Doheny [phone])  on 03/19/2019 9:49:59 AM   Radiology Dg Chest Portable 1 View  Result Date: 03/19/2019 CLINICAL DATA:  Shortness of breath, CHF. EXAM: PORTABLE CHEST 1 VIEW COMPARISON:  Chest x-rays dated 01/26/2019 and 12/01/2018. FINDINGS: Heart size and mediastinal contours are stable. LEFT chest wall pacemaker/ICD apparatus appears stable. Median sternotomy wires appear intact and stable in alignment. Chronic atelectasis/scarring at the LEFT lung base. Lungs otherwise clear. No pleural effusion or pneumothorax seen. No acute or suspicious osseous finding. IMPRESSION: No active disease. No evidence of pneumonia or pulmonary edema. Electronically Signed   By: Franki Cabot M.D.   On: 03/19/2019 09:48    Procedures Procedures (including critical care time)  Medications Ordered in ED Medications  furosemide (LASIX) injection 40 mg (40 mg Intravenous Given 03/19/19 1026)  aspirin chewable tablet 324 mg (324 mg Oral Given 03/19/19 1127)     Initial Impression / Assessment and Plan / ED Course  I have reviewed the triage vital signs and the nursing notes.  Pertinent labs & imaging results that were available during my care of the patient were reviewed by me and considered in my medical decision making (see chart for details).     Final Clinical Impressions(s) / ED Diagnoses   Final diagnoses:  None   Patient is an 85 year old male with a history of atrial flutter (on Eliquis), Mobitz 1 AV block, CAD, carotid stenosis, CHF, COPD, hyperlipidemia, hypertension, s/p pacemaker, aortic  stenosis s/p TAVR, (12/2018), T2DM, who presents to the emergency department today for evaluation of shortness of breath, orthopnea and bilateral lower leg swelling that have been present for the last 2 weeks.  Recently had his torsemide increased by cards. Sxs have continued and today he was advised to come to the ED.  Pt afebrile. Somewhat hypertensive. tachypneic and sats borderline.   CBC with anemia that is somewhat worse than prior. No leukocytosis CMP without electrolyte derangement.  Glucose elevated but no elevated anion gap and normal bicarb.  BUN/creatinine elevated at 29 and 1.63 this appears to be near patient's baseline. Trop is elevated at 0.05.  It does appear that patient has history of similar elevation.  I suspect this is related to demand. BNP elevated above 400, consistent with heart failure exacerbation COVID negative.  EKG with V-paced rhythm, baseline wanter, sttwave abnormality.   CXR with no active disease. No evidence of pneumonia or pulmonary edema.  9:45 AM Re-evaluated pt. RR is 30/minand he appears uncomfortable. Sats are borderline around 92-94%, pt placed on 2L .   Pt with a h/o CHF, with worsening edema and orthopnea x2 week. Recent increase in diuretics as outpatient but sxs persist. Given 40 IV lasix in the ED. He will require admission for acute exac of chronic heart failure.  12:30 PM Discussed case with cardiology who will see patient in the ED.   Interrogated pt's pacemaker. Pt has been in aflutter since the end of May which I suspect is contributing to his sxs.   Pt seen in conjunction with Dr. Vanita Panda who personally evaluated the pt and is in agreement with plan.   At shift change, care transitioned to Hazle Nordmann, PA-C with plan to f/u on pending cardiology consult.    ED Discharge Orders    None       Bishop Dublin 03/19/19 1603    Carmin Muskrat, MD 03/20/19 1553

## 2019-03-19 NOTE — ED Triage Notes (Signed)
Pt states he has been feeling more SOB for the past 2 weeks. Bilateral lower edema, and abd swelling. Denies CP. Denies fevers

## 2019-03-19 NOTE — Progress Notes (Signed)
I talked to his daughter in law on admit and possible DCCV tomorrow.

## 2019-03-19 NOTE — Telephone Encounter (Signed)
Spoke with pt's daughter, [name] (on dpr), asking about pt.  Says pt was taken to [facility] ED this morning and may be admitted. Fyi to Dr. Darnell Level.

## 2019-03-19 NOTE — ED Notes (Signed)
Called [facility] to check on the status of pacemaker interrogation

## 2019-03-19 NOTE — ED Notes (Signed)
Report given to 2C RN. All questions answered. 

## 2019-03-19 NOTE — Telephone Encounter (Signed)
I don't see where pt was evaluated overnight. plz call for update today - if ongoing dyspnea do recommend ER evaluation.

## 2019-03-19 NOTE — ED Notes (Signed)
Pt sat on side of bed and ate lunch.  Pt was SOB while eating.

## 2019-03-19 NOTE — H&P (Addendum)
Cardiology Admission History and Physical:   Patient ID: NADIA TORR MRN: [id number]; DOB: 1932-02-12   Admission date: 03/19/2019  Primary Care Provider: [name], MD Primary Cardiologist: [name], MD  Primary Electrophysiologist:  None   Chief Complaint:  SOB for 2 weeks  Patient Profile:   ELSIE SAKUMA is a 85 y.o. male with hx of CAD, s/p CABG X 4, 03/2003 with a LIMA to his LAD; a vein to the right coronary artery, obtuse marginal branch and ramus branch, 70% bilateral renal artery stenosis, LCEA, HTN, HLD, DM-2, a flutter with TEE DCCV in past.  Also AS and cath 10/2017 with patent grafts and and at most moderate AS but by 12/05/18 he underwent TAVR with Oletta Lamas life science bioprosthetic valve.  PPM placed 04/21/18.  On Eliquis for his a. flutter  History of Present Illness:   Mr. Brammer with above hx and admit in late march with CHF.  Follow up with Dr. Adora Fridge 02/27/19 with no edema after increase in his torsemide to 20 in AM and 40 in evening.  He was stable that visit.    Pt had called last evening with increased SOB, instructed to come to ER.    Pt stated he was SOB for 2 weeks.  With bilateral lower ext edema and abd swelling   EKG:  The ECG that was done possible a flutter with rate control was personally reviewed ST [place] abnormality  No acute changes but the a flutter.   Troponin 0.05 COVID neg BNP 419 Na 138, K+ 4.8, glucose 312, BUN 29, Cr 1.68 - usual Cr 1.16 to 1.22 but since 12/25/18 has been 1.23 to 1.36 since March Hgb 10.7 down from 12.1 on 02/23/19  plts 142 down from 179 to 156   rec'd ASA 324 mg and Lasix 40 mg IV X 1.    Currently still SOB, + edema this started after visit with Dr. [name].   Past Medical History:  Diagnosis Date  . Abdominal aortic atherosclerosis ([place])    by xray  . Arthritis    in lower back (06/24/2015)  . Atrial flutter ([place])    notes 06/24/2015  . AV block, [place] 1 09/06/2017   Archie Endo 09/06/2017  . BRVO  (branch retinal vein occlusion) 2015   bilateral [facility])  . CAD (coronary artery disease) 2004   a. s/p CABG in 2004 with LIMA-LAD, SVG-RCA, SVG-OM, and SVG-RI  . Carotid stenosis 1999   s/p L CEA  . Chronic diastolic CHF (congestive heart failure) ([place])   . Colon polyp 2005   (Dr. Tiffany Kocher)  . Compression fracture of L1 lumbar vertebra (HCC) remote  . COPD (chronic obstructive pulmonary disease) ([place]) 03/2011   by xray  . Depression    hx  . GERD (gastroesophageal reflux disease) 2003   h/o duodenal ulcer per EGD as well as esophagitis  . History of colon polyps 2003, 2005   adenomatous Vira Agar)  . History of tobacco abuse   . HLD (hyperlipidemia)   . HTN (hypertension)   . Presence of permanent cardiac pacemaker   . Psoriasis   . Renal artery stenosis ([place]) 2004   70% bilateral, followed by cards  . 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 Severe aortic stenosis   . T2DM (type 2 diabetes mellitus) ([place]) 1995  . Urge incontinence of urine  Past Surgical History:  Procedure Laterality Date  . CARDIAC CATHETERIZATION  03/06/2003   No intervention - recommend CABG  . CARDIOVASCULAR STRESS TEST  11/2010   normal perfusion, no evidence of ischemia, EF 62% post exercise  . CARDIOVASCULAR STRESS TEST  11/28/2012   Mild diaphragmatic attenuation; cannot exclude a focal region of nontransmural inferior scar  . CARDIOVERSION N/A 06/25/2015   Procedure: CARDIOVERSION;  Surgeon: Pixie Casino, MD;  Location: [facility] ENDOSCOPY;  Service: Cardiovascular;  Laterality: N/A;  . CARDIOVERSION N/A 11/02/2016   Procedure: CARDIOVERSION;  Surgeon: [name], MD;  Location: [place];  Service: Cardiovascular;  Laterality: N/A;  . CAROTID ENDARTERECTOMY Left 1999   ([place])  . CATARACT EXTRACTION W/ INTRAOCULAR LENS  IMPLANT, BILATERAL Bilateral 01/2013   [place]  .  COLONOSCOPY  2003   colon polyp x3 - adenomatous Tiffany Kocher)  . COLONOSCOPY  10/08/2012   2 TA, diverticulosis, int hem, no rpt rec Tiffany Kocher)  . CORONARY ANGIOPLASTY    . CORONARY ARTERY BYPASS GRAFT  03/07/2003   4v CABG ([place]) with LIMA to LAD, vein graft to RCA, 1st obtuse marginal, and ramus intermedius  . ESOPHAGOGASTRODUODENOSCOPY  10/08/2012   nl esophagus, duodenitis and erosive gastropathy, path - gastropathy no Hpylori, no rpt rec  . LUMBAR EPIDURAL INJECTION  03/2017   L5/S1 ([place])  . PACEMAKER IMPLANT N/A 04/21/2018   Procedure: PACEMAKER IMPLANT -- Dual Chamber;  Surgeon: Deboraha Sprang, MD;  Location: [place] CV LAB;  Service: Cardiovascular;  Laterality: N/A;  . PERIPHERAL VASCULAR CATHETERIZATION N/A 02/02/2016   Procedure: Renal Angiography;  Surgeon: [name], MD;  Location: [place] CV LAB;  Service: Cardiovascular;  Laterality: N/A;  . PERIPHERAL VASCULAR CATHETERIZATION N/A 02/02/2016   Procedure: Abdominal Aortogram;  Surgeon: [name], MD;  Location: [place] CV LAB;  Service: Cardiovascular;  Laterality: N/A;  . RENAL DOPPLER  11/29/2011   Celiac&SMA-demonstrated vessel narrowing suggestive of a greater than 50% diameter reduction. Bilateral renal arteries-demonstrated vessel narrowing of 60-99% diameter reduction. Rt Kidney-mid pole lateral simple cyst noted measuring 1.29x0.76x1.11cm and exophytic cyst outside lower pole measuring 1.23x0.96x1.31. Lft Kidney-lateral mid to lower pole simple cyst measuering-1.24x9.83x1.24  . RIGHT/LEFT HEART CATH AND CORONARY/GRAFT ANGIOGRAPHY N/A 10/20/2017   Procedure: RIGHT/LEFT HEART CATH AND CORONARY/GRAFT ANGIOGRAPHY;  Surgeon: Burnell Blanks, MD;  Location: [place] CV LAB;  Service: Cardiovascular;  Laterality: N/A;  . TEE WITHOUT CARDIOVERSION N/A 06/25/2015   Procedure: TRANSESOPHAGEAL ECHOCARDIOGRAM (TEE);  Surgeon: Pixie Casino, MD;  Location: [facility] ENDOSCOPY;  Service: Cardiovascular;  Laterality:  N/A;  . TEE WITHOUT CARDIOVERSION N/A 11/02/2016   Procedure: TRANSESOPHAGEAL ECHOCARDIOGRAM (TEE);  Surgeon: [name], MD;  Location: [place];  Service: Cardiovascular;  Laterality: N/A;  . TEE WITHOUT CARDIOVERSION N/A 12/05/2018   Procedure: TRANSESOPHAGEAL ECHOCARDIOGRAM (TEE);  Surgeon: Sherren Mocha, MD;  Location: [place];  Service: Open Heart Surgery;  Laterality: N/A;  . TONSILLECTOMY    . TRANSCATHETER AORTIC VALVE REPLACEMENT, TRANSFEMORAL N/A 12/05/2018   Procedure: TRANSCATHETER AORTIC VALVE REPLACEMENT, TRANSFEMORAL;  Surgeon: Sherren Mocha, MD;  Location: [place];  Service: Open Heart Surgery;  Laterality: N/A;  . UPPER GASTROINTESTINAL ENDOSCOPY  2003   reflux esophagitis, erosive gastropathy, duodenal ulcer     Medications Prior to Admission: Prior to Admission medications   Medication Sig Start Date End Date Taking? Authorizing Provider  acetaminophen (TYLENOL) 500 MG tablet Take 1 tablet (500 mg total) by mouth 3 (three) times daily. 02/17/16  Yes [name], MD  atorvastatin (LIPITOR) 20 MG tablet Take 1 tablet (20 mg total) by mouth daily. 08/01/18  Yes [name], MD  citalopram (CELEXA) 20 MG tablet Take 1 tablet (20 mg total) by mouth daily. 08/01/18  Yes [name], MD  ELIQUIS 5 MG TABS tablet Take 1 tablet (5 mg total) by mouth 2 (two) times daily. Patient taking differently: Take 5 mg by mouth 2 (two) times daily.  01/10/19  Yes [name], MD  finasteride (PROSCAR) 5 MG tablet Take 1 tablet (5 mg total) by mouth daily. 12/12/18  Yes [name], MD  guaiFENesin (MUCINEX) 600 MG 12 hr tablet Take 600 mg by mouth 2 (two) times daily as needed for cough or to loosen phlegm.   Yes [provider]  LANTUS 100 UNIT/ML injection Inject 0.2 mLs (20 Units total) into the skin at bedtime. Patient taking differently: Inject 20 Units into the skin at bedtime.  02/19/19  Yes [name], MD  lisinopril (PRINIVIL,ZESTRIL) 40  MG tablet Take 1 tablet (40 mg total) by mouth daily. 12/14/18 12/09/19 Yes Eileen Stanford, PA-C  metFORMIN (GLUCOPHAGE) 1000 MG tablet Take 1 tablet (1,000 mg total) by mouth 2 (two) times daily with a meal. 02/19/19  Yes [name], MD  nitroGLYCERIN (NITROSTAT) 0.4 MG SL tablet Place 1 tablet (0.4 mg total) under the tongue every 5 (five) minutes as needed for chest pain. 03/08/18  Yes Almyra Deforest, PA  pantoprazole (PROTONIX) 20 MG tablet Take 1 tablet (20 mg total) by mouth daily. 02/28/19  Yes [name], MD  Potassium Chloride ER 20 MEQ TBCR Take 20 mEq by mouth every Monday, Wednesday, Friday, Saturday, and Sunday at 6 PM. 10/13/18  Yes [name], MD  tamsulosin (FLOMAX) 0.4 MG CAPS capsule Take 1 capsule (0.4 mg total) by mouth daily after supper. 09/26/18  Yes [name], MD  torsemide (DEMADEX) 20 MG tablet Take 20-40 mg by mouth every morning. Last 4 days starting 03-15-19 taking 85m twice daily   Yes [provider]  traZODone (DESYREL) 50 MG tablet Take 1 tablet (50 mg total) by mouth at bedtime. 02/28/19  Yes GRia Bush MD  BD INSULIN SYRINGE U/F 30G X 1/2" 0.5 ML MISC AS DIRECTED. 03/06/19   GRia Bush MD  glucose blood (ACCU-CHEK AVIVA PLUS) test strip 1 each by Other route as needed for other. Use as instructed to check blood sugar 3 (three) times a day.  Dx code:  E11.8, E11.65 07/10/18   GRia Bush MD     Allergies:    Allergies  Allergen Reactions  . Gabapentin Other (See Comments)    Leg pain, weakness   . Metoprolol Swelling  . Spironolactone Other (See Comments)    Painful gynecomastia  . Amlodipine Other (See Comments)    Edema  . Other Other (See Comments)    Horse serum - myalgias  . Rosiglitazone Maleate Other (See Comments)    REACTION: Did not help  . Tricor [Fenofibrate] Other (See Comments)    myalgias    Social History:   Social History   Tobacco Use  . Smoking status: Former Smoker    Packs/day:  0.50    Years: 36.00    Pack years: 18.00    Types: Cigarettes    Quit date: 07/23/2013    Years since quitting: 5.6  . Smokeless tobacco: Never Used  Substance Use Topics  . Alcohol use: No  . Drug use: No   Social History   Social History Narrative  Caffeine: 4 cups coffee/day, some tea and soda   Lives with wife   Occupation: Retired   Activity: no regular exercise   Diet: good water, fruits/vegetables daily     Family History:   The patient's family history includes Cancer in his brother, brother, maternal grandmother, and mother; Diabetes in his sister; Heart attack in his maternal grandfather, paternal grandfather, and paternal grandmother; Heart disease in his father and sister; Hypertension in his father and sister; Lung disease in his brother; Stroke in his sister.    ROS:  Please see the history of present illness.  General:no colds or fevers, no weight changes Skin:no rashes or ulcers HEENT:no blurred vision, no congestion CV:see HPI PUL:see HPI GI:no diarrhea constipation or melena, no indigestion GU:no hematuria, no dysuria MS:no joint pain, no claudication Neuro:no syncope, no lightheadedness Endo:+ diabetes, no thyroid disease      Physical Exam/Data:   Vitals:   03/19/19 1515 03/19/19 1530 03/19/19 1545 03/19/19 1600  BP: (!) 117/48 120/62 (!) 116/54 129/66  Pulse:    70  Resp:    (!) 22  Temp:      TempSrc:      SpO2: 98% 98% 92% 98%  Weight:      Height:        Intake/Output Summary (Last 24 hours) at 03/19/2019 1650 Last data filed at 03/19/2019 1226 Gross per 24 hour  Intake -  Output 300 ml  Net -300 ml   Last 3 Weights 03/19/2019 03/19/2019 02/27/2019  Weight (lbs) 224 lb 222 lb 7.1 oz 222 lb  Weight (kg) 101.606 kg 100.9 kg 100.699 kg     Body mass index is 29.55 kg/m.  General:  Well nourished, well developed,  Mild distress, with increased work of breathing HEENT: normal Lymph: no adenopathy Neck: mild JVD Endocrine:  No  thryomegaly Vascular: No carotid bruits; pedal pulses 2+ bilaterally  Cardiac:  normal S1, S2; RRR; no gallup or rub with soft SEM at RUSB.  Unable to really assess diastolic murmur due to respiratory sounds Lungs:  rales to auscultation bilaterally, no wheezing, rhonchi ; increased work of breathing Abd: soft, nontender, no hepatomegaly  Ext: + 2+ edema of lower ext Musculoskeletal:  No deformities, BUE and BLE strength normal and equal Skin: warm and dry  Neuro:  Alert and oriented X 3 MAE follows commands, no focal abnormalities noted Psych:  Normal affect    Relevant CV Studies: Echo 12/26/18 IMPRESSIONS    1. The left ventricle has low normal systolic function, with an ejection fraction of 50-55%. The cavity size was normal. There is moderately increased left ventricular wall thickness. Left ventricular diastolic Doppler parameters are consistent with  impaired relaxation. Elevated left atrial and left ventricular end-diastolic pressures The E/e' is >20. There is abnormal septal motion consistent with RV pacemaker.  2. Moderate hypokinesis of the left ventricular, basal-mid septal wall.  3. The mitral valve is degenerative. Mild thickening of the mitral valve leaflet. Moderate calcification of the mitral valve leaflet. There is moderate mitral annular calcification present. Mild mitral valve stenosis.  4. The tricuspid valve is grossly normal.  5. A 26 an Edwards Edwards Sapien bioprosthetic aortic valve (TAVR) valve is present in the aortic position. Procedure Date: 12/05/2018 Echo findings are consistent with mild perivalvular leak of the aortic prosthesis.  6. Moderate calcification of the aortic valve. mild stenosis of the aortic valve.  7. Left atrial size was moderately dilated.  8. Right atrial size was moderately dilated.  9. The right ventricle has normal systolic function. The cavity was normal. There is no increase in right ventricular wall thickness. 10. When compared to  the prior study: 12/06/2018: LVEF 55-60%.  SUMMARY   Technically difficult study. LVEF 50-55%, moderate LVH, septal hypokinesis, Definity contrast not given, grade 1 DD with high LV filling pressure, MAC with mild MS and MR, s/p 26 mm Edwards Sapien 3 THV with normal function and mild perivalvular leak, moderate biatrial enlargement, RV pacer  FINDINGS  Left Ventricle: The left ventricle has low normal systolic function, with an ejection fraction of 50-55%. The cavity size was normal. There is moderately increased left ventricular wall thickness. Left ventricular diastolic Doppler parameters are  consistent with impaired relaxation. Elevated left atrial and left ventricular end-diastolic pressures The E/e' is >20. There is abnormal (paradoxical) septal motion, consistent with RV pacemaker. Moderate hypokinesis of the left ventricular, basal-mid  septal wall. Right Ventricle: The right ventricle has normal systolic function. The cavity was normal. There is no increase in right ventricular wall thickness. Pacing wire/catheter visualized in the right ventricle. Left Atrium: left atrial size was moderately dilated Right Atrium: right atrial size was moderately dilated. Right atrial pressure is estimated at 3 mmHg. Interatrial Septum: No atrial level shunt detected by color flow Doppler. Pericardium: There is no evidence of pericardial effusion. Mitral Valve: The mitral valve is degenerative in appearance. Mild thickening of the mitral valve leaflet. Moderate calcification of the mitral valve leaflet. There is moderate mitral annular calcification present. Mitral valve regurgitation is mild by  color flow Doppler. Mild mitral valve stenosis. Tricuspid Valve: The tricuspid valve is grossly normal. Tricuspid valve regurgitation is trivial by color flow Doppler. Aortic Valve: The aortic valve has been repaired/replaced Moderate calcification of the aortic valve. Aortic valve regurgitation was not  visualized by color flow Doppler. There is mild stenosis of the aortic valve, with a calculated valve area of 2.41  cm. A 26 Edwards Edwards Sapien bioprosthetic, stented aortic valve (TAVR) valve is present in the aortic position. Procedure Date: 12/05/2018 Echo findings are consistent with perivalvular leak of the aortic prosthesis. Pulmonic Valve: The pulmonic valve was grossly normal. Pulmonic valve regurgitation is not visualized by color flow Doppler. Venous: The inferior vena cava was not well visualized. Compared to previous exam: 12/06/2018: LVEF 55-60%.   10/20/17 Rt/Lt cardiac cath   Ost RCA to Prox RCA lesion is 100% stenosed.  SVG graft was visualized by angiography and is normal in caliber.  The graft exhibits minimal luminal irregularities.  SVG graft was visualized by angiography and is normal in caliber.  The graft exhibits no disease.  SVG graft was visualized by angiography and is normal in caliber.  The graft exhibits no disease.  LIMA graft was visualized by non-selective angiography and is normal in caliber.  The graft exhibits no disease.  Ost Cx to Prox Cx lesion is 99% stenosed.  Ost 1st Mrg lesion is 99% stenosed.  Ost 2nd Mrg lesion is 100% stenosed.  Ost LAD to Prox LAD lesion is 99% stenosed.  Ost 1st Diag lesion is 50% stenosed.  Hemodynamic findings consistent with mild pulmonary hypertension.   1. Severe triple vessel CAD with 4/4 patent bypass grafts 2. Severe stenosis proximal LAD. The mid and distal LAD filled from the patent LIMA graft.  3 Severe stenosis proximal Circumflex involving the intermediate branch and the first OM. The vein graft to the intermediate branch is patent. The vein graft to the first OM branch is  patent.  4. Chronic occlusion of the proximal RCa. The vein graft to the distal RCA is patent.  5. Aortic valve stenosis (mean gradient 21.5 mmHg, peak to peak gradient 23 mmHg, AVA 1.12 cm2).   Recommendations: I will  review his data with Dr. Burt Knack and the TAVR team. Will proceed with planning for TAVR.   12/05/18 op Procedure:        Transcatheter Aortic Valve Replacement - Percutaneous Left Transfemoral Approach             Edwards Sapien 3 THV (size 26 mm, model # 9600TFX, serial # [id number])               Laboratory Data:  Chemistry Recent Labs  Lab 03/19/19 0940  NA 138  K 4.8  CL 101  CO2 25  GLUCOSE 312*  BUN 29*  CREATININE 1.68*  CALCIUM 9.2  GFRNONAA 37*  GFRAA 43*  ANIONGAP 12    Recent Labs  Lab 03/19/19 0940  PROT 6.9  ALBUMIN 3.8  AST 17  ALT 20  ALKPHOS 86  BILITOT 0.4   Hematology Recent Labs  Lab 03/19/19 0940  WBC 6.4  RBC 3.77*  HGB 10.7*  HCT 35.0*  MCV 92.8  MCH 28.4  MCHC 30.6  RDW 14.0  PLT 142*   Cardiac Enzymes Recent Labs  Lab 03/19/19 0940  TROPONINI 0.05*   No results for input(s): TROPIPOC in the last 168 hours.  BNP Recent Labs  Lab 03/19/19 0940  BNP 419.9*    DDimer No results for input(s): DDIMER in the last 168 hours.  Radiology/Studies:  Dg Chest Portable 1 View  Result Date: 03/19/2019 CLINICAL DATA:  Shortness of breath, CHF. EXAM: PORTABLE CHEST 1 VIEW COMPARISON:  Chest x-rays dated 01/26/2019 and 12/01/2018. FINDINGS: Heart size and mediastinal contours are stable. LEFT chest wall pacemaker/ICD apparatus appears stable. Median sternotomy wires appear intact and stable in alignment. Chronic atelectasis/scarring at the LEFT lung base. Lungs otherwise clear. No pleural effusion or pneumothorax seen. No acute or suspicious osseous finding. IMPRESSION: No active disease. No evidence of pneumonia or pulmonary edema. Electronically Signed   By: Franki Cabot M.D.   On: 03/19/2019 09:48    Assessment and Plan:   1. Acute on chronic diastolic CHF - has rec'd lasix 40 X 1  Second episode of HF since TAVR.   Lasix IV 80 bid  2. TAVR for AS 12/05/18 and last echo 12/26/18 Echo findings are consistent with mild perivalvular leak of  the aortic prosthesis. 3. CAD s/p CABG patent graft 10/2017  4. PAF SSS with PPM and now appears a flutter. Keep NPO and DCCV  tomorrow 5. DM-2   SSI stop metformin for now  6. CKD-2   Severity of Illness: The appropriate patient status for this patient is INPATIENT. Inpatient status is judged to be reasonable and necessary in order to provide the required intensity of service to ensure the patient's safety. The patient's presenting symptoms, physical exam findings, and initial radiographic and laboratory data in the context of their chronic comorbidities is felt to place them at high risk for further clinical deterioration. Furthermore, it is not anticipated that the patient will be medically stable for discharge from the hospital within 2 midnights of admission. The following factors support the patient status of inpatient.   " The patient's presenting symptoms include acute SOB. " The worrisome physical exam findings include SOB, rales, aflutter. " The initial radiographic and laboratory data are worrisome because  of CHF, CKD-3. " The chronic co-morbidities include a flutter, TAVR CAD.   * I certify that at the point of admission it is my clinical judgment that the patient will require inpatient hospital care spanning beyond 2 midnights from the point of admission due to high intensity of service, high risk for further deterioration and high frequency of surveillance required.*     Signed, Cecilie Kicks, NP  03/19/2019 4:50 PM    ATTENDING ATTESTATION  I have seen, examined and evaluated the patient this PM along with Cecilie Kicks, NP-C.  After reviewing all the available data and chart, we discussed the patients laboratory, study & physical findings as well as symptoms in detail. I agree with her findings, examination as well as impression recommendations as per our discussion.    Attending adjustments noted in italics.   Mr. Siegman presented is here today with worsening heart failure  symptoms that may very well be related to presence of atrial flutter on EKG.  I suspect this could probably be because of acute on chronic diastolic heart failure along with possible questions about medical adherence.  Agree with admitting and increasing Lasix to 80 mg twice daily.  Echo was relatively normal post TAVR with only mild perivalvular leak.  Not much of a diastolic murmur heard.  Would like to wait till we get him out of atrial flutter to assess EF,. Consider DCCV tomorrow since he is already on DOAC.  No signs symptoms to suggest angina, suspect mild troponin elevation is demand ischemia related to acute on chronic diastolic heart failure.    Glenetta Hew, M.D., M.S. Interventional Cardiologist   Pager # [phone] Phone # [phone] [address] [place],  [address]      For questions or updates, please contact [place] Please consult [email or web address] for contact info under

## 2019-03-19 NOTE — ED Notes (Signed)
This RN acting as Art therapist and asked pt if he would like for me to call any family for him. Pt requested his son and dtr in law, [name] be informed. I was able to reach [name] and let her know pt's care so far. Will update them once we know pt's disposition.

## 2019-03-19 NOTE — ED Triage Notes (Signed)
Pt in with BLE swelling x 2 wks, sob. States hx of Afib and CHF, has been taking increased amount of Lasix daily, but no improvement. Denies any fevers or cough.

## 2019-03-19 NOTE — ED Notes (Signed)
PA placed pt on 2L [employer]

## 2019-03-19 NOTE — ED Notes (Signed)
ED TO INPATIENT HANDOFF REPORT  ED Nurse Name and Phone #: Kathlee Nations [phone]  S Name/Age/Gender [name] 85 y.o. male Room/Bed: 030C/030C  Code Status   Code Status: Prior  Home/SNF/Other Home Patient oriented to: self, place, time and situation Is this baseline? Yes   Triage Complete: Triage complete  Chief Complaint chf/swelling in feet/afib  Triage Note Pt in with BLE swelling x 2 wks, sob. States hx of Afib and CHF, has been taking increased amount of Lasix daily, but no improvement. Denies any fevers or cough.  Pt states he has been feeling more SOB for the past 2 weeks. Bilateral lower edema, and abd swelling. Denies CP. Denies fevers   Allergies Allergies  Allergen Reactions  . Gabapentin Other (See Comments)    Leg pain, weakness   . Metoprolol Swelling  . Spironolactone Other (See Comments)    Painful gynecomastia  . Amlodipine Other (See Comments)    Edema  . Other Other (See Comments)    Horse serum - myalgias  . Rosiglitazone Maleate Other (See Comments)    REACTION: Did not help  . Tricor [Fenofibrate] Other (See Comments)    myalgias    Level of Care/Admitting Diagnosis ED Disposition    None      B Medical/Surgery History Past Medical History:  Diagnosis Date  . Abdominal aortic atherosclerosis ([place])    by xray  . Arthritis    in lower back (06/24/2015)  . Atrial flutter ([place])    notes 06/24/2015  . AV block, [place] 1 09/06/2017   Archie Endo 09/06/2017  . 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 CAD (coronary artery disease) 2004   a. s/p CABG in 2004 with LIMA-LAD, SVG-RCA, SVG-OM, and SVG-RI  . Carotid stenosis 1999   s/p L CEA  . Chronic diastolic CHF (congestive heart failure) ([place])   . Colon polyp 2005   (Dr. Tiffany Kocher)  . Compression fracture of L1 lumbar vertebra (HCC) remote  . COPD (chronic obstructive pulmonary disease) ([place]) 03/2011   by xray  . Depression    hx  . GERD (gastroesophageal reflux  disease) 2003   h/o duodenal ulcer per EGD as well as esophagitis  . History of colon polyps 2003, 2005   adenomatous Vira Agar)  . History of tobacco abuse   . HLD (hyperlipidemia)   . HTN (hypertension)   . Presence of permanent cardiac pacemaker   . Psoriasis   . Renal artery stenosis ([place]) 2004   70% bilateral, followed by cards  . 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 Severe aortic stenosis   . T2DM (type 2 diabetes mellitus) ([place]) 1995  . Urge incontinence of urine    Past Surgical History:  Procedure Laterality Date  . CARDIAC CATHETERIZATION  03/06/2003   No intervention - recommend CABG  . CARDIOVASCULAR STRESS TEST  11/2010   normal perfusion, no evidence of ischemia, EF 62% post exercise  . CARDIOVASCULAR STRESS TEST  11/28/2012   Mild diaphragmatic attenuation; cannot exclude a focal region of nontransmural inferior scar  . CARDIOVERSION N/A 06/25/2015   Procedure: CARDIOVERSION;  Surgeon: Pixie Casino, MD;  [facility] END[facility]COPY;  Service: Cardiovascular;  Laterality: N/A;  . CARDIOVERSION N/A 11/02/2016   Procedure: CARDIOVERSION;  Surgeon: [name], MD;  Location: [facility];  Service: Cardiovascular;  Laterality: N/A;  . CAROTID ENDARTERECTOMY  Left 1999   ([place])  . CATARACT EXTRACTION W/ INTRAOCULAR LENS  IMPLANT, BILATERAL Bilateral 01/2013   [place]  . COLONOSCOPY  2003   colon polyp x3 - adenomatous Tiffany Kocher)  . COLONOSCOPY  10/08/2012   2 TA, diverticulosis, int hem, no rpt rec Tiffany Kocher)  . CORONARY ANGIOPLASTY    . CORONARY ARTERY BYPASS GRAFT  03/07/2003   4v CABG ([place]) with LIMA to LAD, vein graft to RCA, 1st obtuse marginal, and ramus intermedius  . ESOPHAGOGASTRODUODENOSCOPY  10/08/2012   nl esophagus, duodenitis and erosive gastropathy, path - gastropathy no Hpylori, no rpt rec  . LUMBAR EPIDURAL INJECTION  03/2017   L5/S1 ([place])   . PACEMAKER IMPLANT N/A 04/21/2018   Procedure: PACEMAKER IMPLANT -- Dual Chamber;  Surgeon: Deboraha Sprang, MD;  Location: [facility] CV LAB;  Service: Cardiovascular;  Laterality: N/A;  . PERIPHERAL VASCULAR CATHETERIZATION N/A 02/02/2016   Procedure: Renal Angiography;  Surgeon: [name], MD;  Location: [facility] LAB;  Service: Cardiovascular;  Laterality: N/A;  . PERIPHERAL VASCULAR CATHETERIZATION N/A 02/02/2016   Procedure: Abdominal Aortogram;  Surgeon: [name], MD;  Location: [facility] LAB;  Service: Cardiovascular;  Laterality: N/A;  . RENAL DOPPLER  11/29/2011   Celiac&SMA-demonstrated vessel narrowing suggestive of a greater than 50% diameter reduction. Bilateral renal arteries-demonstrated vessel narrowing of 60-99% diameter reduction. Rt Kidney-mid pole lateral simple cyst noted measuring 1.29x0.76x1.11cm and exophytic cyst outside lower pole measuring 1.23x0.96x1.31. Lft Kidney-lateral mid to lower pole simple cyst measuering-1.24x9.83x1.24  . RIGHT/LEFT HEART CATH AND CORONARY/GRAFT ANGIOGRAPHY N/A 10/20/2017   Procedure: RIGHT/LEFT HEART CATH AND CORONARY/GRAFT ANGIOGRAPHY;  Surgeon: Burnell Blanks, MD;  Location: [facility];  Service: Cardiovascular;  Laterality: N/A;  . TEE WITHOUT CARDIOVERSION N/A 06/25/2015   Procedure: TRANSESOPHAGEAL ECHOCARDIOGRAM (TEE);  Surgeon: Pixie Casino, MD;  Location: [facility] ENDOSCOPY;  Service: Cardiovascular;  Laterality: N/A;  . TEE WITHOUT CARDIOVERSION N/A 11/02/2016   Procedure: TRANSESOPHAGEAL ECHOCARDIOGRAM (TEE);  Surgeon: [name], MD;  Location: [facility];  Service: Cardiovascular;  Laterality: N/A;  . TEE WITHOUT CARDIOVERSION N/A 12/05/2018   Procedure: TRANSESOPHAGEAL ECHOCARDIOGRAM (TEE);  Surgeon: Sherren Mocha, MD;  Location: [facility];  Service: Open Heart Surgery;  Laterality: N/A;  . TONSILLECTOMY    . TRANSCATHETER AORTIC VALVE REPLACEMENT, TRANSFEMORAL N/A 12/05/2018   Procedure:  TRANSCATHETER AORTIC VALVE REPLACEMENT, TRANSFEMORAL;  Surgeon: Sherren Mocha, MD;  Location: [facility];  Service: Open Heart Surgery;  Laterality: N/A;  . UPPER GASTROINTESTINAL ENDOSCOPY  2003   reflux esophagitis, erosive gastropathy, duodenal ulcer     A IV Location/Drains/Wounds Patient Lines/Drains/Airways Status   Active Line/Drains/Airways    Name:   Placement date:   Placement time:   Site:   Days:   Peripheral IV 12/25/18 Left Hand   12/25/18    -    Hand   84   Peripheral IV 03/19/19 Right Antecubital   03/19/19    0942    Antecubital   less than 1   Incision (Closed) 12/05/18 Groin Right   12/05/18    1319     104   Incision (Closed) 12/05/18 Groin Left   12/05/18    1319     104          Intake/Output Last 24 hours  Intake/Output Summary (Last 24 hours) at 03/19/2019 1850 Last data filed at 03/19/2019 1226 Gross per 24 hour  Intake -  Output 300 ml  Net -300 ml  Labs/Imaging Results for orders placed or performed during the hospital encounter of 03/19/19 (from the past 48 hour(s))  CBC with Differential     Status: Abnormal   Collection Time: 03/19/19  9:40 AM  Result Value Ref Range   WBC 6.4 4.0 - 10.5 K/uL   RBC 3.77 (L) 4.22 - 5.81 MIL/uL   Hemoglobin 10.7 (L) 13.0 - 17.0 g/dL   HCT 35.0 (L) 39.0 - 52.0 %   MCV 92.8 80.0 - 100.0 fL   MCH 28.4 26.0 - 34.0 pg   MCHC 30.6 30.0 - 36.0 g/dL   RDW 14.0 11.5 - 15.5 %   Platelets 142 (L) 150 - 400 K/uL   nRBC 0.0 0.0 - 0.2 %   Neutrophils Relative % 70 %   Neutro Abs 4.5 1.7 - 7.7 K/uL   Lymphocytes Relative 14 %   Lymphs Abs 0.9 0.7 - 4.0 K/uL   Monocytes Relative 11 %   Monocytes Absolute 0.7 0.1 - 1.0 K/uL   Eosinophils Relative 3 %   Eosinophils Absolute 0.2 0.0 - 0.5 K/uL   Basophils Relative 1 %   Basophils Absolute 0.0 0.0 - 0.1 K/uL   Immature Granulocytes 1 %   Abs Immature Granulocytes 0.04 0.00 - 0.07 K/uL    Comment: Performed at [facility], [address], [place], [place] [address]   Comprehensive metabolic panel     Status: Abnormal   Collection Time: 03/19/19  9:40 AM  Result Value Ref Range   Sodium 138 135 - 145 mmol/L   Potassium 4.8 3.5 - 5.1 mmol/L   Chloride 101 98 - 111 mmol/L   CO2 25 22 - 32 mmol/L   Glucose, Bld 312 (H) 70 - 99 mg/dL   BUN 29 (H) 8 - 23 mg/dL   Creatinine, Ser 1.68 (H) 0.61 - 1.24 mg/dL   Calcium 9.2 8.9 - 10.3 mg/dL   Total Protein 6.9 6.5 - 8.1 g/dL   Albumin 3.8 3.5 - 5.0 g/dL   AST 17 15 - 41 U/L   ALT 20 0 - 44 U/L   Alkaline Phosphatase 86 38 - 126 U/L   Total Bilirubin 0.4 0.3 - 1.2 mg/dL   GFR calc non Af Amer 37 (L) >60 mL/min   GFR calc Af Amer 43 (L) >60 mL/min   Anion gap 12 5 - 15    Comment: Performed at [facility] [address], [place], [place] [address]  Brain natriuretic peptide     Status: Abnormal   Collection Time: 03/19/19  9:40 AM  Result Value Ref Range   B Natriuretic Peptide 419.9 (H) 0.0 - 100.0 pg/mL    Comment: Performed at [facility] [address], [place], [place] [address]  Troponin I - ONCE - STAT     Status: Abnormal   Collection Time: 03/19/19  9:40 AM  Result Value Ref Range   Troponin I 0.05 (HH) <0.03 ng/mL    Comment: CRITICAL RESULT CALLED TO, READ BACK BY AND VERIFIED WITH: Reganne Messerschmidt,L RN @ [phone] 03/19/19 LEONARD,A Performed at [facility], [place] [address] [place], [place] [address]   SARS Coronavirus 2     Status: None   Collection Time: 03/19/19 10:14 AM  Result Value Ref Range   SARS Coronavirus 2 NOT DETECTED NOT DETECTED    Comment: (NOTE) SARS-CoV-2 target nucleic acids are NOT DETECTED. The SARS-CoV-2 RNA is generally detectable in upper and lower respiratory specimens during the  acute phase of infection.  Negative  results do not preclude SARS-CoV-2 infection, do not rule out co-infections with other pathogens, and should not be used as the sole basis for treatment or other patient management decisions.  Negative results must be combined with clinical  observations, patient history, and epidemiological information. The expected result is Not Detected. Fact Sheet for Patients: http://www.biofiredefense.com/wp-content/uploads/2020/03/BIOFIRE-COVID -19-patients.pdf Fact Sheet for Healthcare Providers: http://www.biofiredefense.com/wp-content/uploads/2020/03/BIOFIRE-COVID -19-hcp.pdf This test is not yet approved or cleared by the [place] and  has been authorized for detection and/or diagnosis of SARS-CoV-2 by FDA under an Emergency Use Authorization (EUA).  This EUA will remain in effec t (meaning this test can be used) for the duration of  the COVID-19 declaration under Section 564(b)(1) of the Act, 21 U.S.C. section 360bbb-3(b)(1), unless the authorization is terminated or revoked sooner. Performed at [facility], [place] [address], [place], [place] [address]    Dg Chest Portable 1 View  Result Date: 03/19/2019 CLINICAL DATA:  Shortness of breath, CHF. EXAM: PORTABLE CHEST 1 VIEW COMPARISON:  Chest x-rays dated 01/26/2019 and 12/01/2018. FINDINGS: Heart size and mediastinal contours are stable. LEFT chest wall pacemaker/ICD apparatus appears stable. Median sternotomy wires appear intact and stable in alignment. Chronic atelectasis/scarring at the LEFT lung base. Lungs otherwise clear. No pleural effusion or pneumothorax seen. No acute or suspicious osseous finding. IMPRESSION: No active disease. No evidence of pneumonia or pulmonary edema. Electronically Signed   By: Franki Cabot M.D.   On: 03/19/2019 09:48    Pending Labs Unresulted Labs (From admission, onward)    Start     Ordered   03/19/19 1014  SARS Coronavirus 2 (CEPHEID- Performed in [facility] hospital lab), Hosp Order  (Symptomatic Patients Labs with Precautions )  Once,   STAT     03/19/19 1014          Vitals/Pain Today's Vitals   03/19/19 1645 03/19/19 1700 03/19/19 1711 03/19/19 1722  BP: (!) 163/82 (!) 159/75 (!) 159/75   Pulse: 70 70 70    Resp: (!) 26 (!) 25 18   Temp:      TempSrc:      SpO2: 99% 99% 99%   Weight:      Height:      PainSc:    0-No pain    Isolation Precautions Droplet and Contact precautions  Medications Medications  furosemide (LASIX) injection 80 mg (has no administration in time range)  furosemide (LASIX) injection 40 mg (40 mg Intravenous Given 03/19/19 1026)  aspirin chewable tablet 324 mg (324 mg Oral Given 03/19/19 1127)    Mobility walks with person assist Moderate fall risk   Focused Assessments Cardiac Assessment Handoff:  Cardiac Rhythm: Normal sinus rhythm Lab Results  Component Value Date   TROPONINI 0.05 ([place]) 03/19/2019   No results found for: DDIMER Does the Patient currently have chest pain? Yes     R Recommendations: See Admitting Provider Note  Report given to:   Additional Notes:

## 2019-03-19 NOTE — ED Notes (Signed)
Pt belongings placed in bags. Pt took out 1 hearing aide. This RN placed hearing aide in specimen cup and placed in belonging bag with shoes. Pt aware.

## 2019-03-19 NOTE — Plan of Care (Signed)
?  Problem: Education: ?Goal: Ability to demonstrate management of disease process will improve ?Outcome: Progressing ?  ?

## 2019-03-19 NOTE — ED Notes (Signed)
Have spoke to [facility] still waiting for pace maker information to transmit.

## 2019-03-19 NOTE — ED Notes (Signed)
Spoke with [facility]. Information still have not transmitted.

## 2019-03-19 NOTE — ED Notes (Signed)
Interrogating pt's TEFL teacher

## 2019-03-20 ENCOUNTER — Encounter (HOSPITAL_COMMUNITY)
Admission: EM | Disposition: A | Discharge status: Home Health | Payer: Self-pay | Source: Home / Self Care | Attending: Cardiology

## 2019-03-20 ENCOUNTER — Inpatient Hospital Stay (HOSPITAL_COMMUNITY): Payer: Medicare Other | Admitting: Certified Registered"

## 2019-03-20 ENCOUNTER — Telehealth: Payer: Medicare Other | Admitting: Internal Medicine

## 2019-03-20 ENCOUNTER — Encounter (HOSPITAL_COMMUNITY): Payer: Self-pay

## 2019-03-20 DIAGNOSIS — I2510 Atherosclerotic heart disease of native coronary artery without angina pectoris: Secondary | ICD-10-CM

## 2019-03-20 DIAGNOSIS — Z7901 Long term (current) use of anticoagulants: Secondary | ICD-10-CM

## 2019-03-20 DIAGNOSIS — N182 Chronic kidney disease, stage 2 (mild): Secondary | ICD-10-CM

## 2019-03-20 DIAGNOSIS — E1165 Type 2 diabetes mellitus with hyperglycemia: Secondary | ICD-10-CM

## 2019-03-20 DIAGNOSIS — M5136 Other intervertebral disc degeneration, lumbar region: Secondary | ICD-10-CM

## 2019-03-20 DIAGNOSIS — E118 Type 2 diabetes mellitus with unspecified complications: Secondary | ICD-10-CM

## 2019-03-20 HISTORY — PX: CARDIOVERSION: SHX1299

## 2019-03-20 LAB — MAGNESIUM: Magnesium: 1.7 mg/dL (ref 1.7–2.4)

## 2019-03-20 LAB — BASIC METABOLIC PANEL
Anion gap: 9 (ref 5–15)
BUN: 27 mg/dL — ABNORMAL HIGH (ref 8–23)
CO2: 29 mmol/L (ref 22–32)
Calcium: 9.2 mg/dL (ref 8.9–10.3)
Chloride: 101 mmol/L (ref 98–111)
Creatinine, Ser: 1.44 mg/dL — ABNORMAL HIGH (ref 0.61–1.24)
GFR calc Af Amer: 51 mL/min — ABNORMAL LOW (ref >60)
GFR calc non Af Amer: 44 mL/min — ABNORMAL LOW (ref >60)
Glucose, Bld: 158 mg/dL — ABNORMAL HIGH (ref 70–99)
Potassium: 4.3 mmol/L (ref 3.5–5.1)
Sodium: 139 mmol/L (ref 135–145)

## 2019-03-20 SURGERY — CARDIOVERSION
Anesthesia: General

## 2019-03-20 MED ORDER — ALBUTEROL SULFATE (2.5 MG/3ML) 0.083% IN NEBU
2.5000 mg | INHALATION_SOLUTION | Freq: Four times a day (QID) | RESPIRATORY_TRACT | Status: DC | PRN
  Administered 2019-03-20: 09:00:00 2.5 mg via RESPIRATORY_TRACT
  Filled 2019-03-20: qty 3

## 2019-03-20 MED ORDER — SODIUM CHLORIDE 0.9 % IV SOLN
INTRAVENOUS | Status: DC | PRN
  Administered 2019-03-20: 09:00:00 via INTRAVENOUS

## 2019-03-20 MED ORDER — SODIUM CHLORIDE 0.9% FLUSH
3.0000 mL | Freq: Two times a day (BID) | INTRAVENOUS | Status: DC
  Administered 2019-03-20 – 2019-03-22 (×5): 3 mL via INTRAVENOUS

## 2019-03-20 MED ORDER — LIDOCAINE 2% (20 MG/ML) 5 ML SYRINGE
INTRAMUSCULAR | Status: DC | PRN
  Administered 2019-03-20: 40 mg via INTRAVENOUS

## 2019-03-20 MED ORDER — PROPOFOL 10 MG/ML IV BOLUS
INTRAVENOUS | Status: DC | PRN
  Administered 2019-03-20: 50 mg via INTRAVENOUS

## 2019-03-20 MED ORDER — SODIUM CHLORIDE 0.9% FLUSH: 3.0000 mL | INTRAVENOUS | Status: DC | PRN

## 2019-03-20 MED ORDER — SODIUM CHLORIDE 0.9 % IV SOLN: 250.0000 mL | INTRAVENOUS | Status: DC

## 2019-03-20 NOTE — Progress Notes (Signed)
Progress Note  Patient Name: Charles [name] Date of Encounter: 03/20/2019  Primary Cardiologist: [name], MD   Subjective   He states this is the third time the fluid is building up and the current manner.  He denies dietary indiscretion and medication noncompliance.  He is going for cardioversion today as set up by the admitting on yesterday and is headed to endoscopy.  I am unsure how long he has been in atrial flutter.  He is on anticoagulation.  Inpatient Medications    Scheduled Meds: . [MAR Hold] acetaminophen  500 mg Oral TID  . [MAR Hold] apixaban  5 mg Oral BID  . [MAR Hold] aspirin EC  81 mg Oral Daily  . [MAR Hold] atorvastatin  20 mg Oral Daily  . [MAR Hold] citalopram  20 mg Oral Daily  . [MAR Hold] finasteride  5 mg Oral Daily  . [MAR Hold] furosemide  80 mg Intravenous Q12H  . [MAR Hold] insulin glargine  5 Units Subcutaneous QHS  . [MAR Hold] lisinopril  40 mg Oral Daily  . [MAR Hold] pantoprazole  40 mg Oral Daily  . [MAR Hold] potassium chloride  10 mEq Oral BID  . [MAR Hold] sodium chloride flush  3 mL Intravenous Q12H  . [MAR Hold] tamsulosin  0.4 mg Oral QPC supper  . [MAR Hold] traZODone  50 mg Oral QHS   Continuous Infusions: . [MAR Hold] sodium chloride     PRN Meds: [MAR Hold] sodium chloride, [MAR Hold] acetaminophen, [MAR Hold] albuterol, [MAR Hold] guaiFENesin, [MAR Hold] nitroGLYCERIN, [MAR Hold] ondansetron (ZOFRAN) IV, [MAR Hold] sodium chloride flush   Vital Signs    Vitals:   03/19/19 2153 03/19/19 2355 03/20/19 0409 03/20/19 0747  BP: (!) 157/89 111/85 136/79 136/72  Pulse: 87 70 70   Resp: (!) 31 19 16 19   Temp: 98.1 F (36.7 C) 97.9 F (36.6 C) 97.7 F (36.5 C) 98 F (36.7 C)  TempSrc: Oral Oral Oral Oral  SpO2: 100% 95% 94% 95%  Weight: 103.8 kg  102.5 kg   Height: 6\' 1"  (1.854 m)       Intake/Output Summary (Last 24 hours) at 03/20/2019 0907 Last data filed at 03/20/2019 0415 Gross per 24 hour  Intake -  Output  1475 ml  Net -1475 ml   Last 3 Weights 03/20/2019 03/19/2019 03/19/2019  Weight (lbs) 225 lb 15.5 oz 228 lb 13.4 oz 224 lb  Weight (kg) 102.5 kg 103.8 kg 101.606 kg      Telemetry    V pacing in the 70s with what appear to be flutter waves- Personally Reviewed  ECG    Admitting EKGs reveal a background atrial rate at approximately 150 beats and ventricular pacing.  There is a 2-1 AVB cadence.  Therefore diagnosis is an atrial tachycardia.- Personally Reviewed  Physical Exam  Elderly gentleman bearded GEN: No acute distress.   Neck: No JVD Cardiac: RRR, no murmurs, rubs, or gallops.  Respiratory: Clear to auscultation bilaterally. GI: Soft, nontender, non-distended  MS: No edema; No deformity. Neuro:  Nonfocal  Psych: Normal affect   Labs    Chemistry Recent Labs  Lab 03/19/19 0940 03/20/19 0311  NA 138 139  K 4.8 4.3  CL 101 101  CO2 25 29  GLUCOSE 312* 158*  BUN 29* 27*  CREATININE 1.68* 1.44*  CALCIUM 9.2 9.2  PROT 6.9  --   ALBUMIN 3.8  --   AST 17  --   ALT 20  --  ALKPHOS 86  --   BILITOT 0.4  --   GFRNONAA 37* 44*  GFRAA 43* 51*  ANIONGAP 12 9     Hematology Recent Labs  Lab 03/19/19 0940  WBC 6.4  RBC 3.77*  HGB 10.7*  HCT 35.0*  MCV 92.8  MCH 28.4  MCHC 30.6  RDW 14.0  PLT 142*    Cardiac Enzymes Recent Labs  Lab 03/19/19 0940  TROPONINI 0.05*   No results for input(s): TROPIPOC in the last 168 hours.   BNP Recent Labs  Lab 03/19/19 0940  BNP 419.9*     DDimer No results for input(s): DDIMER in the last 168 hours.   Radiology    Dg Chest Portable 1 View  Result Date: 03/19/2019 CLINICAL DATA:  Shortness of breath, CHF. EXAM: PORTABLE CHEST 1 VIEW COMPARISON:  Chest x-rays dated 01/26/2019 and 12/01/2018. FINDINGS: Heart size and mediastinal contours are stable. LEFT chest wall pacemaker/ICD apparatus appears stable. Median sternotomy wires appear intact and stable in alignment. Chronic atelectasis/scarring at the LEFT lung  base. Lungs otherwise clear. No pleural effusion or pneumothorax seen. No acute or suspicious osseous finding. IMPRESSION: No active disease. No evidence of pneumonia or pulmonary edema. Electronically Signed   By: Franki Cabot M.D.   On: 03/19/2019 09:48    Cardiac Studies   ECHOCARDIOGRAM 2019: 1. The left ventricle has low normal systolic function, with an ejection fraction of 50-55%. The cavity size was normal. There is moderately increased left ventricular wall thickness. Left ventricular diastolic Doppler parameters are consistent with  impaired relaxation. Elevated left atrial and left ventricular end-diastolic pressures The E/e' is >20. There is abnormal septal motion consistent with RV pacemaker. 2. Moderate hypokinesis of the left ventricular, basal-mid septal wall. 3. The mitral valve is degenerative. Mild thickening of the mitral valve leaflet. Moderate calcification of the mitral valve leaflet. There is moderate mitral annular calcification present. Mild mitral valve stenosis. 4. The tricuspid valve is grossly normal. 5. A 26 an Edwards Edwards Sapien bioprosthetic aortic valve (TAVR) valve is present in the aortic position. Procedure Date: 12/05/2018 Echo findings are consistent with mild perivalvular leak of the aortic prosthesis. 6. Moderate calcification of the aortic valve. mild stenosis of the aortic valve. 7. Left atrial size was moderately dilated. 8. Right atrial size was moderately dilated. 9. The right ventricle has normal systolic function. The cavity was normal. There is no increase in right ventricular wall thickness. 10. When compared to the prior study: 12/06/2018: LVEF 55-60%.  Patient Profile     85 y.o. male with hx of CAD, s/p CABG X 4, 03/2003 with a LIMA to his LAD; a vein to the right coronary artery, obtuse marginal branch and ramus branch, 70% bilateral renal artery stenosis, LCEA, HTN, HLD, DM-2, a flutter with TEE DCCV in past.  Also AS and cath  10/2017 with patent grafts and and at most moderate AS but by 12/05/18 he underwent TAVR with Oletta Lamas life science bioprosthetic valve.  PPM placed 04/21/18.  On Eliquis for his a. flutter  Assessment & Plan    1. Acute on chronic diastolic heart failure.  Precipitant unknown but agree that there is an atrial tachycardia present and cardioversion may be helpful.  Has had atrial flutter in the past and may need antiarrhythmic therapy to help maintain sinus rhythm.  Consider amiodarone.  Consider EP consultation. 2. Atrial tachycardia with variable AV conduction: Electrical cardioversion today.  Consider antiarrhythmic drug therapy.  Consider EP consultation. 3. Status post  TAVR 4. Status post pacemaker for AV block following TAVR 5. Coronary artery disease with prior CABG 2004: Known patent coronary bypass grafts from 2019.  Concur with plan for cardioversion.  Consider antiarrhythmic therapy.  Continue anticoagulation.  Diuresis to resolve acute on chronic heart failure.  For questions or updates, please contact [place] Please consult [email or web address] for contact info under        Signed, Sinclair Grooms, MD  03/20/2019, 9:07 AM

## 2019-03-20 NOTE — Anesthesia Preprocedure Evaluation (Addendum)
Anesthesia Evaluation  Patient identified by MRN, date of birth, ID band Patient awake    Reviewed: Allergy & Precautions, Patient's Chart, lab work & pertinent test results  Airway Mallampati: I       Dental  (+) Upper Dentures, Lower Dentures, Dental Advisory Given   Pulmonary COPD, former smoker,    breath sounds clear to auscultation       Cardiovascular hypertension, Pt. on home beta blockers and Pt. on medications + CAD, + CABG, + Peripheral Vascular Disease and +CHF  + dysrhythmias Atrial Fibrillation + pacemaker + Valvular Problems/Murmurs AS and AI  Rhythm:Irregular Rate:Abnormal  - s/p TAVR   Neuro/Psych PSYCHIATRIC DISORDERS Depression  Neuromuscular disease    GI/Hepatic Neg liver ROS, GERD  Medicated,  Endo/Other  diabetes, Type 2, Insulin Dependent, Oral Hypoglycemic Agents  Renal/GU Renal disease  negative genitourinary   Musculoskeletal  (+) Arthritis , Osteoarthritis,    Abdominal (+) + obese,   Peds negative pediatric ROS (+)  Hematology negative hematology ROS (+)   Anesthesia Other Findings   Reproductive/Obstetrics negative OB ROS                            Lab Results  Component Value Date   WBC 6.4 03/19/2019   HGB 10.7 (L) 03/19/2019   HCT 35.0 (L) 03/19/2019   MCV 92.8 03/19/2019   PLT 142 (L) 03/19/2019   Lab Results  Component Value Date   CREATININE 1.44 (H) 03/20/2019   BUN 27 (H) 03/20/2019   NA 139 03/20/2019   K 4.3 03/20/2019   CL 101 03/20/2019   CO2 29 03/20/2019   Lab Results  Component Value Date   INR 1.1 12/25/2018   INR 1.0 12/01/2018   INR 1.1 10/14/2017    Echo - Left ventricle: There was mild concentric hypertrophy. Systolic   function was normal. The estimated ejection fraction was in the   range of 55% to 60%. Wall motion was normal; there were no   regional wall motion abnormalities. - Aortic valve: Moderately calcified with  mild to moderate aortic   stenosis. - Mitral valve: There was mild regurgitation. No vegetation. - Left atrium: The atrium was dilated. No evidence of thrombus in   the atrial cavity or appendage. No evidence of thrombus in the   atrial cavity or appendage. - Right atrium: No evidence of thrombus in the atrial cavity or   appendage. - Atrial septum: No defect or patent foramen ovale was identified. - Tricuspid valve: There was trivial regurgitation. No vegetation. - Pulmonic valve: No evidence of vegetation.  Anesthesia Physical  Anesthesia Plan  ASA: III  Anesthesia Plan: General   Post-op Pain Management:    Induction: Intravenous  PONV Risk Score and Plan:   Airway Management Planned: Natural Airway and Mask  Additional Equipment:   Intra-op Plan:   Post-operative Plan:   Informed Consent: I have reviewed the patients History and Physical, chart, labs and discussed the procedure including the risks, benefits and alternatives for the proposed anesthesia with the patient or authorized representative who has indicated his/her understanding and acceptance.       Plan Discussed with: CRNA  Anesthesia Plan Comments:        Anesthesia Quick Evaluation

## 2019-03-20 NOTE — Anesthesia Postprocedure Evaluation (Signed)
Anesthesia Post Note  Patient: DELIO SLATES  Procedure(s) Performed: CARDIOVERSION (N/A )     Patient location during evaluation: PACU Anesthesia Type: General Level of consciousness: awake and alert Pain management: pain level controlled Vital Signs Assessment: post-procedure vital signs reviewed and stable Respiratory status: spontaneous breathing, nonlabored ventilation, respiratory function stable and patient connected to nasal cannula oxygen Cardiovascular status: blood pressure returned to baseline and stable Postop Assessment: no apparent nausea or vomiting Anesthetic complications: no    Last Vitals:  Vitals:   03/20/19 1115 03/20/19 1130  BP: 110/62 126/68  Pulse:  85  Resp: 19 18  Temp:    SpO2: 96% 95%    Last Pain:  Vitals:   03/20/19 1047  TempSrc: Oral  PainSc: 0-No pain                 Effie Berkshire

## 2019-03-20 NOTE — Anesthesia Procedure Notes (Signed)
Procedure Name: General with mask airway Date/Time: 03/20/2019 9:48 AM Performed by: Orlie Dakin, CRNA Pre-anesthesia Checklist: Patient being monitored, Suction available, Emergency Drugs available and Patient identified Patient Re-evaluated:Patient Re-evaluated prior to induction Oxygen Delivery Method: Ambu bag Preoxygenation: Pre-oxygenation with 100% oxygen Induction Type: IV induction

## 2019-03-20 NOTE — H&P (View-Only) (Signed)
Progress Note  Patient Name: Charles [name] Date of Encounter: 03/20/2019  Primary Cardiologist: [name], MD   Subjective   He states this is the third time the fluid is building up and the current manner.  He denies dietary indiscretion and medication noncompliance.  He is going for cardioversion today as set up by the admitting on yesterday and is headed to endoscopy.  I am unsure how long he has been in atrial flutter.  He is on anticoagulation.  Inpatient Medications    Scheduled Meds: . [MAR Hold] acetaminophen  500 mg Oral TID  . [MAR Hold] apixaban  5 mg Oral BID  . [MAR Hold] aspirin EC  81 mg Oral Daily  . [MAR Hold] atorvastatin  20 mg Oral Daily  . [MAR Hold] citalopram  20 mg Oral Daily  . [MAR Hold] finasteride  5 mg Oral Daily  . [MAR Hold] furosemide  80 mg Intravenous Q12H  . [MAR Hold] insulin glargine  5 Units Subcutaneous QHS  . [MAR Hold] lisinopril  40 mg Oral Daily  . [MAR Hold] pantoprazole  40 mg Oral Daily  . [MAR Hold] potassium chloride  10 mEq Oral BID  . [MAR Hold] sodium chloride flush  3 mL Intravenous Q12H  . [MAR Hold] tamsulosin  0.4 mg Oral QPC supper  . [MAR Hold] traZODone  50 mg Oral QHS   Continuous Infusions: . [MAR Hold] sodium chloride     PRN Meds: [MAR Hold] sodium chloride, [MAR Hold] acetaminophen, [MAR Hold] albuterol, [MAR Hold] guaiFENesin, [MAR Hold] nitroGLYCERIN, [MAR Hold] ondansetron (ZOFRAN) IV, [MAR Hold] sodium chloride flush   Vital Signs    Vitals:   03/19/19 2153 03/19/19 2355 03/20/19 0409 03/20/19 0747  BP: (!) 157/89 111/85 136/79 136/72  Pulse: 87 70 70   Resp: (!) 31 19 16 19   Temp: 98.1 F (36.7 C) 97.9 F (36.6 C) 97.7 F (36.5 C) 98 F (36.7 C)  TempSrc: Oral Oral Oral Oral  SpO2: 100% 95% 94% 95%  Weight: 103.8 kg  102.5 kg   Height: 6\' 1"  (1.854 m)       Intake/Output Summary (Last 24 hours) at 03/20/2019 0907 Last data filed at 03/20/2019 0415 Gross per 24 hour  Intake -  Output  1475 ml  Net -1475 ml   Last 3 Weights 03/20/2019 03/19/2019 03/19/2019  Weight (lbs) 225 lb 15.5 oz 228 lb 13.4 oz 224 lb  Weight (kg) 102.5 kg 103.8 kg 101.606 kg      Telemetry    V pacing in the 70s with what appear to be flutter waves- Personally Reviewed  ECG    Admitting EKGs reveal a background atrial rate at approximately 150 beats and ventricular pacing.  There is a 2-1 AVB cadence.  Therefore diagnosis is an atrial tachycardia.- Personally Reviewed  Physical Exam  Elderly gentleman bearded GEN: No acute distress.   Neck: No JVD Cardiac: RRR, no murmurs, rubs, or gallops.  Respiratory: Clear to auscultation bilaterally. GI: Soft, nontender, non-distended  MS: No edema; No deformity. Neuro:  Nonfocal  Psych: Normal affect   Labs    Chemistry Recent Labs  Lab 03/19/19 0940 03/20/19 0311  NA 138 139  K 4.8 4.3  CL 101 101  CO2 25 29  GLUCOSE 312* 158*  BUN 29* 27*  CREATININE 1.68* 1.44*  CALCIUM 9.2 9.2  PROT 6.9  --   ALBUMIN 3.8  --   AST 17  --   ALT 20  --  ALKPHOS 86  --   BILITOT 0.4  --   GFRNONAA 37* 44*  GFRAA 43* 51*  ANIONGAP 12 9     Hematology Recent Labs  Lab 03/19/19 0940  WBC 6.4  RBC 3.77*  HGB 10.7*  HCT 35.0*  MCV 92.8  MCH 28.4  MCHC 30.6  RDW 14.0  PLT 142*    Cardiac Enzymes Recent Labs  Lab 03/19/19 0940  TROPONINI 0.05*   No results for input(s): TROPIPOC in the last 168 hours.   BNP Recent Labs  Lab 03/19/19 0940  BNP 419.9*     DDimer No results for input(s): DDIMER in the last 168 hours.   Radiology    Dg Chest Portable 1 View  Result Date: 03/19/2019 CLINICAL DATA:  Shortness of breath, CHF. EXAM: PORTABLE CHEST 1 VIEW COMPARISON:  Chest x-rays dated 01/26/2019 and 12/01/2018. FINDINGS: Heart size and mediastinal contours are stable. LEFT chest wall pacemaker/ICD apparatus appears stable. Median sternotomy wires appear intact and stable in alignment. Chronic atelectasis/scarring at the LEFT lung  base. Lungs otherwise clear. No pleural effusion or pneumothorax seen. No acute or suspicious osseous finding. IMPRESSION: No active disease. No evidence of pneumonia or pulmonary edema. Electronically Signed   By: Franki Cabot M.D.   On: 03/19/2019 09:48    Cardiac Studies   ECHOCARDIOGRAM 2019: 1. The left ventricle has low normal systolic function, with an ejection fraction of 50-55%. The cavity size was normal. There is moderately increased left ventricular wall thickness. Left ventricular diastolic Doppler parameters are consistent with  impaired relaxation. Elevated left atrial and left ventricular end-diastolic pressures The E/e' is >20. There is abnormal septal motion consistent with RV pacemaker. 2. Moderate hypokinesis of the left ventricular, basal-mid septal wall. 3. The mitral valve is degenerative. Mild thickening of the mitral valve leaflet. Moderate calcification of the mitral valve leaflet. There is moderate mitral annular calcification present. Mild mitral valve stenosis. 4. The tricuspid valve is grossly normal. 5. A 26 an Edwards Edwards Sapien bioprosthetic aortic valve (TAVR) valve is present in the aortic position. Procedure Date: 12/05/2018 Echo findings are consistent with mild perivalvular leak of the aortic prosthesis. 6. Moderate calcification of the aortic valve. mild stenosis of the aortic valve. 7. Left atrial size was moderately dilated. 8. Right atrial size was moderately dilated. 9. The right ventricle has normal systolic function. The cavity was normal. There is no increase in right ventricular wall thickness. 10. When compared to the prior study: 12/06/2018: LVEF 55-60%.  Patient Profile     85 y.o. male with hx of CAD, s/p CABG X 4, 03/2003 with a LIMA to his LAD; a vein to the right coronary artery, obtuse marginal branch and ramus branch, 70% bilateral renal artery stenosis, LCEA, HTN, HLD, DM-2, a flutter with TEE DCCV in past.  Also AS and cath  10/2017 with patent grafts and and at most moderate AS but by 12/05/18 he underwent TAVR with Oletta Lamas life science bioprosthetic valve.  PPM placed 04/21/18.  On Eliquis for his a. flutter  Assessment & Plan    1. Acute on chronic diastolic heart failure.  Precipitant unknown but agree that there is an atrial tachycardia present and cardioversion may be helpful.  Has had atrial flutter in the past and may need antiarrhythmic therapy to help maintain sinus rhythm.  Consider amiodarone.  Consider EP consultation. 2. Atrial tachycardia with variable AV conduction: Electrical cardioversion today.  Consider antiarrhythmic drug therapy.  Consider EP consultation. 3. Status post  TAVR 4. Status post pacemaker for AV block following TAVR 5. Coronary artery disease with prior CABG 2004: Known patent coronary bypass grafts from 2019.  Concur with plan for cardioversion.  Consider antiarrhythmic therapy.  Continue anticoagulation.  Diuresis to resolve acute on chronic heart failure.  For questions or updates, please contact [facility] Please consult [email or web address] for contact info under        Signed, Sinclair Grooms, MD  03/20/2019, 9:07 AM

## 2019-03-20 NOTE — CV Procedure (Signed)
    Electrical Cardioversion Procedure Note Charles [name] [id number] May 17, 1932  Procedure: Electrical Cardioversion Indications:  Atrial Flutter  Time Out: Verified patient identification, verified procedure,medications/allergies/relevent history reviewed, required imaging and test results available.  Performed  Procedure Details  The patient was NPO after midnight. Anesthesia was administered at the beside  by Dr.Hollis with propofol.  Cardioversion was performed with synchronized biphasic defibrillation via AP pads with 120 joules.  1 attempt(s) were performed.  The patient converted to normal sinus rhythm. The patient tolerated the procedure well   IMPRESSION:  Successful cardioversion of atrial flutter. A-V paced for several beats then V paced only. Pacer will be interrogated post procedure.    Charles [name] 03/20/2019, 9:52 AM

## 2019-03-20 NOTE — Progress Notes (Signed)
Transported to endo by bed for cardioversion awake and alert.

## 2019-03-20 NOTE — Transfer of Care (Signed)
Immediate Anesthesia Transfer of Care Note  Patient: Charles [name]  Procedure(s) Performed: CARDIOVERSION (N/A )  Patient Location: Endoscopy Unit  Anesthesia Type:General  Level of Consciousness: awake and patient cooperative  Airway & Oxygen Therapy: Patient Spontanous Breathing and Patient connected to nasal cannula oxygen  Post-op Assessment: Report given to RN and Post -op Vital signs reviewed and stable  Post vital signs: Reviewed and stable  Last Vitals:  Vitals Value Taken Time  BP    Temp    Pulse    Resp    SpO2      Last Pain:  Vitals:   03/20/19 0908  TempSrc: Oral  PainSc: 0-No pain         Complications: No apparent anesthesia complications

## 2019-03-20 NOTE — Progress Notes (Signed)
Back from endo by bed awake and alert. 

## 2019-03-20 NOTE — Interval H&P Note (Signed)
History and Physical Interval Note:  03/20/2019 9:33 AM  Charles [name]  has presented today for surgery, with the diagnosis of afib.  The various methods of treatment have been discussed with the patient and family. After consideration of risks, benefits and other options for treatment, the patient has consented to  Procedure(s): CARDIOVERSION (N/A) as a surgical intervention.  The patient's history has been reviewed, patient examined, no change in status, stable for surgery.  I have reviewed the patient's chart and labs.  Questions were answered to the patient's satisfaction.     [employer]

## 2019-03-21 LAB — BASIC METABOLIC PANEL
Anion gap: 10 (ref 5–15)
BUN: 23 mg/dL (ref 8–23)
CO2: 32 mmol/L (ref 22–32)
Calcium: 9.2 mg/dL (ref 8.9–10.3)
Chloride: 98 mmol/L (ref 98–111)
Creatinine, Ser: 1.41 mg/dL — ABNORMAL HIGH (ref 0.61–1.24)
GFR calc Af Amer: 53 mL/min — ABNORMAL LOW (ref >60)
GFR calc non Af Amer: 45 mL/min — ABNORMAL LOW (ref >60)
Glucose, Bld: 163 mg/dL — ABNORMAL HIGH (ref 70–99)
Potassium: 4.1 mmol/L (ref 3.5–5.1)
Sodium: 140 mmol/L (ref 135–145)

## 2019-03-21 LAB — GLUCOSE, CAPILLARY: Glucose-Capillary: 285 mg/dL — ABNORMAL HIGH (ref 70–99)

## 2019-03-21 MED ORDER — TORSEMIDE 20 MG PO TABS
40.0000 mg | ORAL_TABLET | Freq: Every day | ORAL | Status: DC
  Administered 2019-03-22: 10:00:00 40 mg via ORAL
  Filled 2019-03-21: qty 2

## 2019-03-21 NOTE — Progress Notes (Signed)
PA Ria Comment with cardiology came up to see pt around 2:30p and discussed pt's sats with and without O2, at rest and ambulation, she has discussed pt with Dr Tamala Julian and pt will stay and continue to be monitored, and PT consult was placed.  Nurse will inform pt of plan, PA had already discussed possibility of staying another night to have oxygen monitored. When PA rounded on pt, nurse requested PA to contact son or daughter n law (pt's POAs he states) to give them the plan and answer any questions.

## 2019-03-21 NOTE — Plan of Care (Signed)

## 2019-03-21 NOTE — Progress Notes (Signed)
At 1415 pt c/o feeling little SOB, sat at 90%, placed on 1L/[place], sats 90-91% with 1 L  (on RA at rest sats still went between 82-90%).   At 1423, sat 85% getting back to bed on 1L/[place], pt SOB, O2 increased to 2L.    sats remaining 93-95 with 2L/[place].

## 2019-03-21 NOTE — Discharge Instructions (Signed)

## 2019-03-21 NOTE — Discharge Summary (Addendum)
The patient has been seen in conjunction with Charles Bellis, NP. All aspects of care have been considered and discussed. The patient has been personally interviewed, examined, and all clinical data has been reviewed.   CHF precipitated by Atrial flutter--> converted to NSR. If recurrent, may need AAD therapy.  We have increased the intensity of diuretic therapy and will require BMET within 7 days and TOC for further adjustment.   Discharge Summary    Patient ID: Charles [name],  MRN: [id number], DOB/AGE: 1932-06-19 85 y.o.  Admit date: 03/19/2019 Discharge date: 03/22/2019  Primary Care Provider: Ria [name] Primary Cardiologist: Charles [name]. Charles [name]   Discharge Diagnoses    Principal Problem:   Acute on chronic diastolic CHF (congestive heart failure) ([place]) Active Problems:   Diabetes mellitus type 2, uncontrolled, with complications ([place])   DDD (degenerative disc disease), lumbar   Chronic anticoagulation   Coronary artery disease involving native coronary artery of native heart without angina pectoris   Paroxysmal atrial flutter ([facility])   S/P TAVR (transcatheter aortic valve replacement)   CKD (chronic kidney disease), stage II   Allergies Allergies  Allergen Reactions  . Gabapentin Other (See Comments)    Leg pain, weakness   . Metoprolol Swelling  . Spironolactone Other (See Comments)    Painful gynecomastia  . Amlodipine Other (See Comments)    Edema  . Other Other (See Comments)    Horse serum - myalgias  . Rosiglitazone Maleate Other (See Comments)    REACTION: Did not help  . Tricor [Fenofibrate] Other (See Comments)    myalgias    Diagnostic Studies/Procedures    [name]- 03/20/19 _____________  History of Present Illness     85 y.o. male with hx of CAD, s/p CABG X 4, 03/2003 with a Charles [name] to his Charles [name]; a vein to the right coronary artery, obtuse marginal branch and ramus branch, 70% bilateral renal artery stenosis, LCEA, HTN, HLD, DM-2, a flutter  with Charles [name] in past.  Also AS and cath 10/2017 with patent grafts and at most moderate AS but by 12/05/18 he underwent TAVR with Charles [name] life science bioprosthetic valve.  PPM placed 04/21/18.  On Eliquis for his a. flutter  Charles [name] with above hx and admit in late march with CHF.  Followed up with Charles [name] 02/27/19 with no edema after increase in his torsemide to 20 in AM and 40 in evening. He was stable that visit. Pt had called the evening prior to admisson with increased SOB, instructed to come to ER. Pt stated he was SOB for 2 weeks with bilateral lower ext edema and abd swelling   EKG:  The ECG that was done with a flutter with rate control was personally reviewed ST [place] abnormality  No acute changes but the a flutter.   Troponin 0.05 COVID neg BNP 419 Na 138, K+ 4.8, glucose 312, BUN 29, Cr 1.68 - usual Cr 1.16 to 1.22 but since 12/25/18 has been 1.23 to 1.36 since March Hgb 10.7 down from 12.1 on 02/23/19  plts 142 down from 179 to 156   He was given ASA 324 mg and Lasix 40 mg IV X 1.    Reported still feeling short of breath even after attempting to diuresis with lasix at home. He was admitted with plans to diuresis and place for cardioversion.    Hospital Course     1. Acute on chronic diastolic heart failure.  His lasix was increased to 80mg  IV lasix  BID and improved with conversion to normal sinus rhythm and diuresis. Net - 5.2L. Weight down from 222>>216 lbs. He was transitioned back to [facility] prior to discharge. Will change home dose to 60mg  daily. Further adjustment can be made as an outpatient. Did have episodes of hypoxia but mostly noted while at rest. Evaluated by PT and noted to desat with ambulation prior to discharge. Will arrange for home O2.  -- check BMET at follow up appt.   2. Atrial flutter: underwent successful [name]. Will be continued on Eliquis 5mg  BID as prior to admission. Have arranged outpatient follow with Charles [name] to discuss possible antiarrhythmic  options.   3. Status post TAVR: stable on recent echo from 12/26/18.  4. S/p PPM: placed after AV block following TAVR. Followed by Charles [name].   5. Coronary artery disease with prior CABG 2004: Known patent coronary bypass grafts from 2019. No chest pain this admission.   Charles [name] was seen by Dr. Tamala Julian and determined stable for discharge home. Follow up in the office has been arranged. Medications are listed below.   _____________  Discharge Vitals Blood pressure 131/72, pulse 85, temperature 98.3 F (36.8 C), temperature source Oral, resp. rate 18, height 6\' 1"  (1.854 m), weight 98.1 kg, SpO2 95 %.  Filed Weights   03/20/19 0409 03/21/19 0500 03/22/19 0500  Weight: 102.5 kg 99.9 kg 98.1 kg    Labs & Radiologic Studies    CBC No results for input(s): WBC, NEUTROABS, HGB, HCT, MCV, PLT in the last 72 hours. Basic Metabolic Panel Recent Labs    03/19/19 2241  03/20/19 0311 03/21/19 0303 03/22/19 0248  NA  --    < > 139 140 138  K  --    < > 4.3 4.1 3.9  CL  --    < > 101 98 99  CO2  --    < > 29 32 31  GLUCOSE  --    < > 158* 163* 218*  BUN  --    < > 27* 23 25*  CREATININE  --    < > 1.44* 1.41* 1.48*  CALCIUM  --    < > 9.2 9.2 9.1  MG 1.7  --  1.7  --   --    < > = values in this interval not displayed.   Liver Function Tests No results for input(s): AST, ALT, ALKPHOS, BILITOT, PROT, ALBUMIN in the last 72 hours. No results for input(s): LIPASE, AMYLASE in the last 72 hours. Cardiac Enzymes No results for input(s): CKTOTAL, CKMB, CKMBINDEX, TROPONINI in the last 72 hours. BNP Invalid input(s): POCBNP D-Dimer No results for input(s): DDIMER in the last 72 hours. Hemoglobin A1C No results for input(s): HGBA1C in the last 72 hours. Fasting Lipid Panel No results for input(s): CHOL, HDL, LDLCALC, TRIG, CHOLHDL, LDLDIRECT in the last 72 hours. Thyroid Function Tests Recent Labs    03/19/19 2241  TSH 1.707   _____________  Dg Chest Portable 1 View   Result Date: 03/19/2019 CLINICAL DATA:  Shortness of breath, CHF. EXAM: PORTABLE CHEST 1 VIEW COMPARISON:  Chest x-rays dated 01/26/2019 and 12/01/2018. FINDINGS: Heart size and mediastinal contours are stable. LEFT chest wall pacemaker/ICD apparatus appears stable. Median sternotomy wires appear intact and stable in alignment. Chronic atelectasis/scarring at the LEFT lung base. Lungs otherwise clear. No pleural effusion or pneumothorax seen. No acute or suspicious osseous finding. IMPRESSION: No active disease. No evidence of pneumonia or pulmonary edema. Electronically Signed  By: Franki Cabot M.D.   On: 03/19/2019 09:48   Disposition   Pt is being discharged home today in good condition.  Follow-up Plans & Appointments    Follow-up Information    Lendon Colonel, NP Follow up on 03/29/2019.   Specialties: Nurse Practitioner, Radiology, Cardiology Why: at 9:30am for your follow up appt.  Contact information: [address] [place] [address] [phone]        Deboraha Sprang, MD Follow up on 04/10/2019.   Specialty: Cardiology Why: at 3:15pm for your follow up appt.  Contact information: [address] [address] [phone]          Discharge Instructions    (HEART FAILURE PATIENTS) Call MD:  Anytime you have any of the following symptoms: 1) 3 pound weight gain in 24 hours or 5 pounds in 1 week 2) shortness of breath, with or without a dry hacking cough 3) swelling in the hands, feet or stomach 4) if you have to sleep on extra pillows at night in order to breathe.   Complete by: As directed    Diet - low sodium heart healthy   Complete by: As directed    Discharge instructions   Complete by: As directed    For patients with congestive heart failure, we give them these special instructions:  1. Follow a low-salt diet and watch your fluid intake. In general, you should not be taking in more than 2 liters of fluid per day (no  more than 8 glasses per day). Some patients are restricted to less than 1.5 liters of fluid per day (no more than 6 glasses per day). This includes sources of water in foods like soup, coffee, tea, milk, etc. 2. Weigh yourself on the same scale at same time of day and keep a log. 3. Call your doctor: (Anytime you feel any of the following symptoms)  - 3-4 pound weight gain in 1-2 days or 2 pounds overnight  - Shortness of breath, with or without a dry hacking cough  - Swelling in the hands, feet or stomach  - If you have to sleep on extra pillows at night in order to breathe   IT IS IMPORTANT TO LET YOUR DOCTOR KNOW EARLY ON IF YOU ARE HAVING SYMPTOMS SO WE CAN HELP YOU!   Increase activity slowly   Complete by: As directed       Discharge Medications     Medication List    TAKE these medications   acetaminophen 500 MG tablet Commonly known as: TYLENOL Take 1 tablet (500 mg total) by mouth 3 (three) times daily.   atorvastatin 20 MG tablet Commonly known as: LIPITOR Take 1 tablet (20 mg total) by mouth daily.   BD Insulin Syringe U/F 30G X 1/2" 0.5 ML Misc Generic drug: Insulin Syringe-Needle U-100 AS DIRECTED.   citalopram 20 MG tablet Commonly known as: CELEXA Take 1 tablet (20 mg total) by mouth daily.   Eliquis 5 MG Tabs tablet Generic drug: apixaban Take 1 tablet (5 mg total) by mouth 2 (two) times daily. What changed: See the new instructions.   finasteride 5 MG tablet Commonly known as: PROSCAR Take 1 tablet (5 mg total) by mouth daily.   glucose blood test strip Commonly known as: Accu-Chek Aviva Plus 1 each by Other route as needed for other. Use as instructed to check blood sugar 3 (three) times a day.  Dx code:  E11.8, E11.65  guaiFENesin 600 MG 12 hr tablet Commonly known as: MUCINEX Take 600 mg by mouth 2 (two) times daily as needed for cough or to loosen phlegm.   Lantus 100 UNIT/ML injection Generic drug: insulin glargine Inject 0.2 mLs (20 Units  total) into the skin at bedtime. What changed: See the new instructions.   lisinopril 40 MG tablet Commonly known as: ZESTRIL Take 1 tablet (40 mg total) by mouth daily.   metFORMIN 1000 MG tablet Commonly known as: GLUCOPHAGE Take 1 tablet (1,000 mg total) by mouth 2 (two) times daily with a meal.   nitroGLYCERIN 0.4 MG SL tablet Commonly known as: NITROSTAT Place 1 tablet (0.4 mg total) under the tongue every 5 (five) minutes as needed for chest pain.   pantoprazole 20 MG tablet Commonly known as: PROTONIX Take 1 tablet (20 mg total) by mouth daily.   Potassium Chloride ER 20 MEQ Tbcr Take 20 mEq by mouth every Monday, Wednesday, Friday, Saturday, and Sunday at 6 PM.   tamsulosin 0.4 MG Caps capsule Commonly known as: FLOMAX Take 1 capsule (0.4 mg total) by mouth daily after supper.   torsemide 20 MG tablet Commonly known as: DEMADEX Take 3 tablets (60 mg total) by mouth daily. Start taking on: March 23, 2019 What changed:   how much to take  when to take this  additional instructions   traZODone 50 MG tablet Commonly known as: DESYREL Take 1 tablet (50 mg total) by mouth at bedtime.        Acute coronary syndrome (MI, NSTEMI, STEMI, etc) this admission?: No.     Outstanding Labs/Studies   BMET at follow up appt.   Duration of Discharge Encounter   Greater than 30 minutes including physician time.  Signed, Charles Bellis NP-C 03/22/2019, 11:17 AM

## 2019-03-21 NOTE — Progress Notes (Addendum)
Progress Note  Patient Name: Charles [name] Date of Encounter: 03/21/2019  Primary Cardiologist: [name], MD   Subjective   He feels better today.  Breathing is is near baseline.  He has not ambulated yet.  Had successful electrical cardioversion yesterday..  Inpatient Medications    Scheduled Meds: . acetaminophen  500 mg Oral TID  . apixaban  5 mg Oral BID  . aspirin EC  81 mg Oral Daily  . atorvastatin  20 mg Oral Daily  . citalopram  20 mg Oral Daily  . finasteride  5 mg Oral Daily  . furosemide  80 mg Intravenous Q12H  . insulin glargine  5 Units Subcutaneous QHS  . lisinopril  40 mg Oral Daily  . pantoprazole  40 mg Oral Daily  . potassium chloride  10 mEq Oral BID  . sodium chloride flush  3 mL Intravenous Q12H  . sodium chloride flush  3 mL Intravenous Q12H  . tamsulosin  0.4 mg Oral QPC supper  . traZODone  50 mg Oral QHS   Continuous Infusions: . sodium chloride    . sodium chloride     PRN Meds: sodium chloride, acetaminophen, albuterol, guaiFENesin, nitroGLYCERIN, ondansetron (ZOFRAN) IV, sodium chloride flush, sodium chloride flush   Vital Signs    Vitals:   03/21/19 0330 03/21/19 0500 03/21/19 0721 03/21/19 1035  BP: 111/66  130/68   Pulse:   (!) 102 90  Resp: 19     Temp: 98.1 F (36.7 C)  97.9 F (36.6 C) 98.4 F (36.9 C)  TempSrc: Oral  Oral Oral  SpO2: 94%     Weight:  99.9 kg    Height:        Intake/Output Summary (Last 24 hours) at 03/21/2019 1130 Last data filed at 03/21/2019 1035 Gross per 24 hour  Intake 239 ml  Output 4700 ml  Net -4461 ml   Last 3 Weights 03/21/2019 03/20/2019 03/19/2019  Weight (lbs) 220 lb 3.8 oz 225 lb 15.5 oz 228 lb 13.4 oz  Weight (kg) 99.9 kg 102.5 kg 103.8 kg      He is approximately 5.7 L negative and weights are down 9 pounds since admission.  Telemetry    Atrial tracking with ventricular pacing- Personally Reviewed  ECG    Admitting EKGs reveal a background atrial rate at approximately  150 beats and ventricular pacing.  There is a 2-1 AVB cadence.  Therefore diagnosis is an atrial tachycardia.- Personally Reviewed  Physical Exam  Elderly gentleman bearded GEN: No acute distress.   Neck: No JVD Cardiac: RRR, no murmurs, rubs, or gallops.  Respiratory: Clear to auscultation bilaterally. GI: Soft, nontender, non-distended  MS: No edema; No deformity. Neuro:  Nonfocal  Psych: Normal affect   Labs    Chemistry Recent Labs  Lab 03/19/19 0940 03/20/19 0311 03/21/19 0303  NA 138 139 140  K 4.8 4.3 4.1  CL 101 101 98  CO2 25 29 32  GLUCOSE 312* 158* 163*  BUN 29* 27* 23  CREATININE 1.68* 1.44* 1.41*  CALCIUM 9.2 9.2 9.2  PROT 6.9  --   --   ALBUMIN 3.8  --   --   AST 17  --   --   ALT 20  --   --   ALKPHOS 86  --   --   BILITOT 0.4  --   --   GFRNONAA 37* 44* 45*  GFRAA 43* 51* 53*  ANIONGAP 12 9 10  Hematology Recent Labs  Lab 03/19/19 0940  WBC 6.4  RBC 3.77*  HGB 10.7*  HCT 35.0*  MCV 92.8  MCH 28.4  MCHC 30.6  RDW 14.0  PLT 142*    Cardiac Enzymes Recent Labs  Lab 03/19/19 0940  TROPONINI 0.05*   No results for input(s): TROPIPOC in the last 168 hours.   BNP Recent Labs  Lab 03/19/19 0940  BNP 419.9*     DDimer No results for input(s): DDIMER in the last 168 hours.   Radiology    No results found.  Cardiac Studies   ECHOCARDIOGRAM 2019: 1. The left ventricle has low normal systolic function, with an ejection fraction of 50-55%. The cavity size was normal. There is moderately increased left ventricular wall thickness. Left ventricular diastolic Doppler parameters are consistent with  impaired relaxation. Elevated left atrial and left ventricular end-diastolic pressures The E/e' is >20. There is abnormal septal motion consistent with RV pacemaker. 2. Moderate hypokinesis of the left ventricular, basal-mid septal wall. 3. The mitral valve is degenerative. Mild thickening of the mitral valve leaflet. Moderate  calcification of the mitral valve leaflet. There is moderate mitral annular calcification present. Mild mitral valve stenosis. 4. The tricuspid valve is grossly normal. 5. A 26 an Edwards Edwards Sapien bioprosthetic aortic valve (TAVR) valve is present in the aortic position. Procedure Date: 12/05/2018 Echo findings are consistent with mild perivalvular leak of the aortic prosthesis. 6. Moderate calcification of the aortic valve. mild stenosis of the aortic valve. 7. Left atrial size was moderately dilated. 8. Right atrial size was moderately dilated. 9. The right ventricle has normal systolic function. The cavity was normal. There is no increase in right ventricular wall thickness. 10. When compared to the prior study: 12/06/2018: LVEF 55-60%.  Patient Profile     85 y.o. male with hx of CAD, s/p CABG X 4, 03/2003 with a LIMA to his LAD; a vein to the right coronary artery, obtuse marginal branch and ramus branch, 70% bilateral renal artery stenosis, LCEA, HTN, HLD, DM-2, a flutter with TEE DCCV in past.  Also AS and cath 10/2017 with patent grafts and and at most moderate AS but by 12/05/18 he underwent TAVR with Oletta Lamas life science bioprosthetic valve.  PPM placed 04/21/18.  On Eliquis for his a. flutter  Assessment & Plan    1. Acute on chronic diastolic heart failure.  Improved with conversion to normal sinus rhythm and diuresis. 2. Atrial tachycardia with variable AV conduction: Successful electrical cardioversion.  If recurrent a flutter may need to consider ablation or antiarrhythmic drug therapy.  Perhaps this could be addressed by his EP specialist- Dr. Caryl Comes. 3. Status post TAVR 4. Status post pacemaker for AV block following TAVR 5. Coronary artery disease with prior CABG 2004: Known patent coronary bypass grafts from 2019.  Concur with plan for cardioversion.  Consider antiarrhythmic therapy.  Continue anticoagulation.  Diuresis to resolve acute on chronic heart failure.   Converted to torsemide 40 mg daily.  Ambulate and if he does not desaturate off oxygen, will be a candidate for discharge this afternoon.  Will need a basic metabolic panel in 1 week.  Will need both EP follow-up with Dr. Caryl Comes to discuss antiarrhythmic therapy.  Needs follow-up with primary cardiologist/TOC 7 to 10 days.  For questions or updates, please contact [place] Please consult [email or web address] for contact info under        Signed, Sinclair Grooms, MD  03/21/2019, 11:30 AM

## 2019-03-21 NOTE — Telephone Encounter (Signed)
Patient currently admitted to hospital.

## 2019-03-21 NOTE — Progress Notes (Addendum)
NT walked pt in hall way, reports pt O2 sat 89-to 90% at rest with O2 on, and sats were 91-92% RA walking, pt states he had some SOB while walking and was exhausted after walking, sats 90% after back to room and sat down.  At this time, pt up in chair with O2 on 2L sats 90-92%, O2 removed and sats have droped to 86-88% while resting in recliner chair.   Plan to walk pt again to assess sats ambulating, CARDS PA just called and she was informed of the above and plan to walk him again by RN.    AT 1330, just walked pt again  Sat 84-88% at rest in chair on RA x 41mins. Sat 90-95% while walking in hall on RA, pt had to sit and rest before making it back to his room. Pt states not SOB, just legs were tired.  Sats sitting at rest in hallway 92-93%.  Wheeled back to his room and in recliner, sats down to 84-90% RA with good wave form showing on monitor.    Sats 85-90% while sitting for 10 mins post walk.   SATURATION QUALIFICATIONS: (This note is used to comply with regulatory documentation for home oxygen)  Patient Saturations on Room Air at Rest = 84-88%  Patient Saturations on Room Air while Ambulating = 90-95%  Patient Saturations on 0 Liters of oxygen while Ambulating = 90-95%  Please briefly explain why patient needs home oxygen: See note above for sats at rest.   MD to decide if pt needs O2 for home use.

## 2019-03-22 ENCOUNTER — Telehealth: Payer: Self-pay | Admitting: Adult Health

## 2019-03-22 LAB — BASIC METABOLIC PANEL
Anion gap: 8 (ref 5–15)
BUN: 25 mg/dL — ABNORMAL HIGH (ref 8–23)
CO2: 31 mmol/L (ref 22–32)
Calcium: 9.1 mg/dL (ref 8.9–10.3)
Chloride: 99 mmol/L (ref 98–111)
Creatinine, Ser: 1.48 mg/dL — ABNORMAL HIGH (ref 0.61–1.24)
GFR calc Af Amer: 50 mL/min — ABNORMAL LOW (ref >60)
GFR calc non Af Amer: 43 mL/min — ABNORMAL LOW (ref >60)
Glucose, Bld: 218 mg/dL — ABNORMAL HIGH (ref 70–99)
Potassium: 3.9 mmol/L (ref 3.5–5.1)
Sodium: 138 mmol/L (ref 135–145)

## 2019-03-22 MED ORDER — TORSEMIDE 20 MG PO TABS: 60.0000 mg | ORAL_TABLET | Freq: Every day | ORAL | 1 refills | Status: DC

## 2019-03-22 MED ORDER — TORSEMIDE 20 MG PO TABS: 60.0000 mg | ORAL_TABLET | Freq: Every day | ORAL | Status: DC

## 2019-03-22 NOTE — TOC Transition Note (Addendum)
Transition of Care [facility]) - CM/SW Discharge Note   Patient Details  Name: Charles [name] MRN: [id number] Date of Birth: [birth year]  Transition of Care [facility] CM/SW Contact:  Maryclare Labrador, RN Phone Number: 03/22/2019, 1:14 PM   Clinical Narrative:   PTA from home alone.  Pt son will transport pt home and family will provide recommended supervision at discharge.Pt was recommended for SNF however admantly refused - pt is interested in going home with home health.  Pt deemed appropriate for discharge home today by attending.  CM offered choice for [facility] agency based on medicare.gov verbally - alert pt of the barrier with home health agency acceptance, pt informed CM to simply find an agency that would accept.  CM contacted [facility] - agency accepted referral.  Pt in agreement with [facility] - CM informed pt of quality score per https://hill.biz/.  Pt chose [employer] for home oxygen - agency accepted referral.  Both agencies informed of correct address as documented by bedside nurse.      Final next level of care: [place] Barriers to Discharge: Barriers Resolved   Patient Goals and CMS Choice Patient states their goals for this hospitalization and ongoing recovery are:: Pt just continues to state he is ready to go home - pt would not elaborate on goals CMS Medicare.gov Compare Post Acute Care list provided to:: Patient Choice offered to / list presented to : Patient  Discharge Placement                       Discharge Plan and Services                                     Social Determinants of Health (SDOH) Interventions     Readmission Risk Interventions No flowsheet data found.

## 2019-03-22 NOTE — Evaluation (Signed)
Physical Therapy Evaluation Patient Details Name: Charles [name] MRN: [id number] DOB: 1931-11-18 Today's Date: 03/22/2019   History of Present Illness  85 y.o. male with hx of CAD, s/p CABG X 4, 03/2003 with a LIMA to his LAD; a vein to the right coronary artery, obtuse marginal branch and ramus branch, 70% bilateral renal artery stenosis, LCEA, HTN, HLD, DM-2, a flutter with TEE DCCV in past.  Also AS and cath 10/2017 with patent grafts and and at most moderate AS but by 12/05/18 he underwent TAVR with Oletta Lamas life science bioprosthetic valve.  PPM placed 04/21/18.  Underwent cardioversion this hospital stay.   Clinical Impression  Pt admitted with above diagnosis. Pt currently with functional limitations due to the deficits listed below (see PT Problem List). Pt was able to ambulate with rollator in room with cues for safety.  Pt states that he is going home and will not agree to SNF.  Recommend [facility],   [facility] and [facility] if pt refuses SNF.  Will follow acutely.  Pt will benefit from skilled PT to increase their independence and safety with mobility to allow discharge to the venue listed below.    SATURATION QUALIFICATIONS: (This note is used to comply with regulatory documentation for home oxygen)  Patient Saturations on Room Air at Rest = 87%  Patient Saturations on Room Air while Ambulating = 86%  Patient Saturations on 3 Liters of oxygen while Ambulating = 88%  Please briefly explain why patient needs home oxygen:Pt desat on RA at rest and with activity.    Follow Up Recommendations SNF(Pt refusing SNF, if home - HHPT, [facility] and [facility])    Equipment Recommendations  Home O2  Recommendations for Other Services       Precautions / Restrictions Precautions Precautions: Fall Restrictions Weight Bearing Restrictions: No      Mobility  Bed Mobility Overal bed mobility: Independent                Transfers Overall transfer level: Independent                   Ambulation/Gait Ambulation/Gait assistance: Min guard Gait Distance (Feet): 30 Feet Assistive device: 4-wheeled walker Gait Pattern/deviations: Step-to pattern;Decreased step length - right;Decreased step length - left;Decreased stride length;Trunk flexed;Wide base of support;Drifts right/left   Gait velocity interpretation: <1.31 ft/sec, indicative of household ambulator General Gait Details: Pt was able to ambulate with the rollator and needed cues to stand tall however pt states he always leans on rollator. Cues to slow down as well as to stay close to rollator.  Pt with decr safety at times.  Discussed SNF for rehab but pt does not want to do that currently.    Stairs            Wheelchair Mobility    Modified Rankin (Stroke Patients Only)       Balance Overall balance assessment: Needs assistance Sitting-balance support: No upper extremity supported;Feet supported Sitting balance-Leahy Scale: Fair     Standing balance support: Bilateral upper extremity supported;During functional activity Standing balance-Leahy Scale: Poor Standing balance comment: relies heavily on UES                             Pertinent Vitals/Pain Pain Assessment: No/denies pain    Home Living Family/patient expects to be discharged to:: Private residence Living Arrangements: Alone Available Help at Discharge: Family;Available PRN/intermittently Type of Home: House Home Access: Level entry  Home Layout: One level Home Equipment: [place] - 4 wheels;Shower seat;Cane - single point;Grab bars - tub/shower Additional Comments: Son lives very close by and checks on pt as needed.    Prior Function Level of Independence: Independent with assistive device(s)         Comments: used rollator all the time per pt     Hand Dominance   Dominant Hand: Right    Extremity/Trunk Assessment   Upper Extremity Assessment Upper Extremity Assessment: Defer to OT evaluation     Lower Extremity Assessment Lower Extremity Assessment: Generalized weakness    Cervical / Trunk Assessment Cervical / Trunk Assessment: Kyphotic  Communication   Communication: HOH  Cognition Arousal/Alertness: Awake/alert Behavior During Therapy: WFL for tasks assessed/performed Overall Cognitive Status: Within Functional Limits for tasks assessed                                        General Comments      Exercises General Exercises - Lower Extremity Ankle Circles/Pumps: AROM;Both;10 reps;Seated Long Arc Quad: AROM;Both;10 reps;Seated   Assessment/Plan    PT Assessment Patient needs continued PT services  PT Problem List Decreased balance;Decreased activity tolerance;Decreased mobility;Decreased knowledge of use of DME;Decreased safety awareness;Decreased knowledge of precautions;Cardiopulmonary status limiting activity       PT Treatment Interventions DME instruction;Gait training;Functional mobility training;Therapeutic activities;Therapeutic exercise;Balance training;Patient/family education    PT Goals (Current goals can be found in the Care Plan section)  Acute Rehab PT Goals Patient Stated Goal: to go home PT Goal Formulation: With patient Time For Goal Achievement: 04/05/19 Potential to Achieve Goals: Good    Frequency Min 3X/week   Barriers to discharge Decreased caregiver support      Co-evaluation               AM-PAC PT "6 Clicks" Mobility  Outcome Measure Help needed turning from your back to your side while in a flat bed without using bedrails?: None Help needed moving from lying on your back to sitting on the side of a flat bed without using bedrails?: None Help needed moving to and from a bed to a chair (including a wheelchair)?: None Help needed standing up from a chair using your arms (e.g., wheelchair or bedside chair)?: None Help needed to walk in hospital room?: A Little Help needed climbing 3-5 steps with a  railing? : A Little 6 Click Score: 22    End of Session Equipment Utilized During Treatment: Gait belt;Oxygen Activity Tolerance: Patient limited by fatigue Patient left: with call bell/phone within reach;in chair Nurse Communication: Mobility status PT Visit Diagnosis: Unsteadiness on feet (R26.81);Muscle weakness (generalized) (M62.81)    Time: 9833-8250 PT Time Calculation (min) (ACUTE ONLY): 13 min   Charges:   PT Evaluation $PT Eval Moderate Complexity: [place] Pager:  [phone]  Office:  [phone]    Denice Paradise 03/22/2019, 11:29 AM

## 2019-03-22 NOTE — Progress Notes (Signed)
Inpatient Diabetes Program Recommendations  AACE/ADA: New Consensus Statement on Inpatient Glycemic Control (2015)  Target Ranges:  Prepandial:   less than 140 mg/dL      Peak postprandial:   less than 180 mg/dL (1-2 hours)      Critically ill patients:  140 - 180 mg/dL   Lab Results  Component Value Date   GLUCAP 285 (H) 03/21/2019   HGBA1C 11.0 (H) 02/23/2019  Results for Charles [name], Charles [name] (MRN [id number]) as of 03/22/2019 09:19  Ref. Range 03/19/2019 21:50 03/21/2019 21:11  Glucose-Capillary Latest Ref Range: 70 - 99 mg/dL 220 (H) 285 (H)    Review of Glycemic Control  Diabetes history: type 2 Outpatient Diabetes medications: Lantus 20 units every HS, Metformin 1000 mg BID Current orders for Inpatient glycemic control: Lantus 5 units every HS  Inpatient Diabetes Program Recommendations:    Noted that patient's lab glucose has been greater than 180 mg/dl. Recommend checking CBGs TID & HS. Recommend adding Novolog SENSITIVE correction scale TID & HS scale if blood sugars continue to be elevated. Will continue to monitor while in the hospital.   Harvel Ricks RN BSN CDE Diabetes Coordinator Pager: [phone]  8am-5pm

## 2019-03-22 NOTE — Progress Notes (Signed)
Progress Note  Patient Name: Charles [name] Date of Encounter: 03/22/2019  Primary Cardiologist: [name], MD   Subjective   Wants to go home. Feels better than on admission. Desaturates at rest.  Inpatient Medications    Scheduled Meds: . acetaminophen  500 mg Oral TID  . apixaban  5 mg Oral BID  . aspirin EC  81 mg Oral Daily  . atorvastatin  20 mg Oral Daily  . citalopram  20 mg Oral Daily  . finasteride  5 mg Oral Daily  . insulin glargine  5 Units Subcutaneous QHS  . lisinopril  40 mg Oral Daily  . pantoprazole  40 mg Oral Daily  . potassium chloride  10 mEq Oral BID  . sodium chloride flush  3 mL Intravenous Q12H  . sodium chloride flush  3 mL Intravenous Q12H  . tamsulosin  0.4 mg Oral QPC supper  . torsemide  40 mg Oral Daily  . traZODone  50 mg Oral QHS   Continuous Infusions: . sodium chloride    . sodium chloride     PRN Meds: sodium chloride, acetaminophen, albuterol, guaiFENesin, nitroGLYCERIN, ondansetron (ZOFRAN) IV, sodium chloride flush, sodium chloride flush   Vital Signs    Vitals:   03/21/19 1900 03/21/19 2340 03/22/19 0500 03/22/19 0710  BP: 95/80 106/87  (!) 112/51  Pulse: 96   94  Resp: (!) 23 18    Temp: (!) 97.4 F (36.3 C) 98.4 F (36.9 C)  98.5 F (36.9 C)  TempSrc: Oral Oral  Oral  SpO2: 91% 91%  95%  Weight:   98.1 kg   Height:        Intake/Output Summary (Last 24 hours) at 03/22/2019 0930 Last data filed at 03/22/2019 0834 Gross per 24 hour  Intake 2008 ml  Output 1700 ml  Net 308 ml   Last 3 Weights 03/22/2019 03/21/2019 03/20/2019  Weight (lbs) 216 lb 4.3 oz 220 lb 3.8 oz 225 lb 15.5 oz  Weight (kg) 98.1 kg 99.9 kg 102.5 kg      He is approximately 5.7 L negative and weights are down 9 pounds since admission.  Telemetry    Atrial tracking with ventricular pacing- Personally Reviewed  ECG    Admitting EKGs reveal a background atrial rate at approximately 150 beats and ventricular pacing.  There is a 2-1 AVB  cadence.  Therefore diagnosis is an atrial tachycardia.- Personally Reviewed  Physical Exam  Elderly gentleman bearded GEN: No acute distress.   Neck: No JVD Cardiac: RRR, no murmurs, rubs, or gallops.  Respiratory: Clear to auscultation bilaterally. GI: Soft, nontender, non-distended  MS: No edema; No deformity. Neuro:  Nonfocal  Psych: Normal affect   Labs    Chemistry Recent Labs  Lab 03/19/19 0940 03/20/19 0311 03/21/19 0303 03/22/19 0248  NA 138 139 140 138  K 4.8 4.3 4.1 3.9  CL 101 101 98 99  CO2 25 29 32 31  GLUCOSE 312* 158* 163* 218*  BUN 29* 27* 23 25*  CREATININE 1.68* 1.44* 1.41* 1.48*  CALCIUM 9.2 9.2 9.2 9.1  PROT 6.9  --   --   --   ALBUMIN 3.8  --   --   --   AST 17  --   --   --   ALT 20  --   --   --   ALKPHOS 86  --   --   --   BILITOT 0.4  --   --   --  GFRNONAA 37* 44* 45* 43*  GFRAA 43* 51* 53* 50*  ANIONGAP 12 9 10 8      Hematology Recent Labs  Lab 03/19/19 0940  WBC 6.4  RBC 3.77*  HGB 10.7*  HCT 35.0*  MCV 92.8  MCH 28.4  MCHC 30.6  RDW 14.0  PLT 142*    Cardiac Enzymes Recent Labs  Lab 03/19/19 0940  TROPONINI 0.05*   No results for input(s): TROPIPOC in the last 168 hours.   BNP Recent Labs  Lab 03/19/19 0940  BNP 419.9*     DDimer No results for input(s): DDIMER in the last 168 hours.   Radiology    No results found.  Cardiac Studies   ECHOCARDIOGRAM 2019: 1. The left ventricle has low normal systolic function, with an ejection fraction of 50-55%. The cavity size was normal. There is moderately increased left ventricular wall thickness. Left ventricular diastolic Doppler parameters are consistent with  impaired relaxation. Elevated left atrial and left ventricular end-diastolic pressures The E/e' is >20. There is abnormal septal motion consistent with RV pacemaker. 2. Moderate hypokinesis of the left ventricular, basal-mid septal wall. 3. The mitral valve is degenerative. Mild thickening of the mitral  valve leaflet. Moderate calcification of the mitral valve leaflet. There is moderate mitral annular calcification present. Mild mitral valve stenosis. 4. The tricuspid valve is grossly normal. 5. A 26 an Edwards Edwards Sapien bioprosthetic aortic valve (TAVR) valve is present in the aortic position. Procedure Date: 12/05/2018 Echo findings are consistent with mild perivalvular leak of the aortic prosthesis. 6. Moderate calcification of the aortic valve. mild stenosis of the aortic valve. 7. Left atrial size was moderately dilated. 8. Right atrial size was moderately dilated. 9. The right ventricle has normal systolic function. The cavity was normal. There is no increase in right ventricular wall thickness. 10. When compared to the prior study: 12/06/2018: LVEF 55-60%.  Patient Profile     85 y.o. male with hx of CAD, s/p CABG X 4, 03/2003 with a LIMA to his LAD; a vein to the right coronary artery, obtuse marginal branch and ramus branch, 70% bilateral renal artery stenosis, LCEA, HTN, HLD, DM-2, a flutter with TEE DCCV in past.  Also AS and cath 10/2017 with patent grafts and and at most moderate AS but by 12/05/18 he underwent TAVR with Oletta Lamas life science bioprosthetic valve.  PPM placed 04/21/18.  On Eliquis for his a. flutter  Assessment & Plan    1. Acute on chronic diastolic heart failure.  Improved with conversion to normal sinus rhythm and diuresis. Back on Demadex, no negativity in I/O. Will need larger dose as OP if still felt volume overloaded.Chage to 60 mg daily. 2. Atrial tachycardia with variable AV conduction: atrial travking 3. Status post TAVR 4. Status post pacemaker for AV block following TAVR 5. Coronary artery disease with prior CABG 2004: Known patent coronary bypass grafts from 2019. 6. Hypoxia, while sitting. Oxygenation okay with activity. ? Orthodeoxia syndrome.   Converted to torsemide 60 mg daily.  Ambulate and if he does not desaturate off oxygen, will be a  candidate for discharge this afternoon.  Will need a basic metabolic panel in 1 week.  Will need both EP follow-up with Dr. Caryl Comes to discuss antiarrhythmic therapy.  Needs follow-up with primary cardiologist/TOC 7 to 10 days.  For questions or updates, please contact [place] Please consult [email or web address] for contact info under        Signed, Sinclair Grooms, MD  03/22/2019, 9:30 AM

## 2019-03-22 NOTE — Progress Notes (Signed)
Pt's address: [address] [place], [place] [address]   [place]

## 2019-03-22 NOTE — Consult Note (Signed)
   [facility] CM Inpatient Consult   03/22/2019  Charles [name] 1932-01-07 [id number]   Patient screened for 3 hospitalizations in the past 6 months and to to check if potential [facility] services needed.  Review of patient's medical record reveals patient is admitted with Atrial Fib and Acute on chronic HF and HX includes but not limited to DM type 2. Patient with [employer].  Primary Care Provider is [name], MD This office is listed to provide the transition of care follow up.  Review of inpatient [facility] team reveals patient lives alone but will have family supervision as he is currently declining a skilled nursing facility for rehab.  Plan:  Called patient's room and no answer.  [facility] will follow with EMMI calls.   For questions contact:   Natividad Brood, RN BSN [facility] Liaison  [phone] business mobile phone Toll free office [phone]  Fax number: [phone] [email or web address]

## 2019-03-22 NOTE — Progress Notes (Signed)
SATURATION QUALIFICATIONS: (This note is used to comply with regulatory documentation for home oxygen)  Patient Saturations on Room Air at Rest = 87%  Patient Saturations on Room Air while Ambulating = 86%  Patient Saturations on 3 Liters of oxygen while Ambulating = 94%  Please briefly explain why patient needs home oxygen: patient need  3L oxygen while rest & ambulating. HS [employer]

## 2019-03-22 NOTE — Progress Notes (Signed)
SATURATION QUALIFICATIONS: (This note is used to comply with regulatory documentation for home oxygen)  Patient Saturations on Room Air at Rest = 87%  Patient Saturations on Room Air while Ambulating = 86%  Patient Saturations on 3 Liters of oxygen while Ambulating = 88%  Please briefly explain why patient needs home oxygen:Pt desat on RA at rest and with activity.  Will need home O2.  Thanks.  [place] Pager:  [phone]  Office:  [phone]

## 2019-03-22 NOTE — Telephone Encounter (Signed)
TOC Patient- Please call Patient- Pt has an appointment with Jory Sims on 03-29-19

## 2019-03-22 NOTE — Progress Notes (Signed)
Removed PIV access and received discharge instructions. Gave discharge instructions to daugther in law regarding medications and oxygen tank. Home health was arranged by care manager. HS [employer]

## 2019-03-22 NOTE — Telephone Encounter (Signed)
Still admitted 03/19/2019 - present (3 days) [place]

## 2019-03-23 NOTE — Telephone Encounter (Signed)
TOC attempt #1 - discharged 6/18 Phone rang, no answer

## 2019-03-24 DIAGNOSIS — E1122 Type 2 diabetes mellitus with diabetic chronic kidney disease: Secondary | ICD-10-CM | POA: Diagnosis not present

## 2019-03-24 DIAGNOSIS — I5033 Acute on chronic diastolic (congestive) heart failure: Secondary | ICD-10-CM | POA: Diagnosis not present

## 2019-03-24 DIAGNOSIS — E1165 Type 2 diabetes mellitus with hyperglycemia: Secondary | ICD-10-CM | POA: Diagnosis not present

## 2019-03-24 DIAGNOSIS — N183 Chronic kidney disease, stage 3 (moderate): Secondary | ICD-10-CM | POA: Diagnosis not present

## 2019-03-24 DIAGNOSIS — I130 Hypertensive heart and chronic kidney disease with heart failure and stage 1 through stage 4 chronic kidney disease, or unspecified chronic kidney disease: Secondary | ICD-10-CM | POA: Diagnosis not present

## 2019-03-26 ENCOUNTER — Encounter: Payer: Self-pay | Admitting: Thoracic Surgery (Cardiothoracic Vascular Surgery)

## 2019-03-26 DIAGNOSIS — N183 Chronic kidney disease, stage 3 (moderate): Secondary | ICD-10-CM | POA: Diagnosis not present

## 2019-03-26 DIAGNOSIS — E1122 Type 2 diabetes mellitus with diabetic chronic kidney disease: Secondary | ICD-10-CM | POA: Diagnosis not present

## 2019-03-26 DIAGNOSIS — E1165 Type 2 diabetes mellitus with hyperglycemia: Secondary | ICD-10-CM | POA: Diagnosis not present

## 2019-03-26 DIAGNOSIS — I130 Hypertensive heart and chronic kidney disease with heart failure and stage 1 through stage 4 chronic kidney disease, or unspecified chronic kidney disease: Secondary | ICD-10-CM | POA: Diagnosis not present

## 2019-03-26 DIAGNOSIS — I5033 Acute on chronic diastolic (congestive) heart failure: Secondary | ICD-10-CM | POA: Diagnosis not present

## 2019-03-26 NOTE — Telephone Encounter (Signed)
Left message for pt to call.

## 2019-03-27 ENCOUNTER — Telehealth: Payer: Self-pay | Admitting: *Deleted

## 2019-03-27 DIAGNOSIS — E1165 Type 2 diabetes mellitus with hyperglycemia: Secondary | ICD-10-CM | POA: Diagnosis not present

## 2019-03-27 DIAGNOSIS — N183 Chronic kidney disease, stage 3 (moderate): Secondary | ICD-10-CM | POA: Diagnosis not present

## 2019-03-27 DIAGNOSIS — I130 Hypertensive heart and chronic kidney disease with heart failure and stage 1 through stage 4 chronic kidney disease, or unspecified chronic kidney disease: Secondary | ICD-10-CM | POA: Diagnosis not present

## 2019-03-27 DIAGNOSIS — E1122 Type 2 diabetes mellitus with diabetic chronic kidney disease: Secondary | ICD-10-CM | POA: Diagnosis not present

## 2019-03-27 DIAGNOSIS — I5033 Acute on chronic diastolic (congestive) heart failure: Secondary | ICD-10-CM | POA: Diagnosis not present

## 2019-03-27 NOTE — Telephone Encounter (Signed)
Called to comfirm 03/29/19 appointment with Jory Sims, DNP----memory  full and could not leave message

## 2019-03-27 NOTE — Telephone Encounter (Signed)
Left message on son's  (scott) cell phone---regarding 03/29/19 appointment with Jory Sims, DNP---please bring insurance cards, copayment, medication listing--visitors are not allowed and patient is required to wear a mask---requested call back if he has questions.

## 2019-03-27 NOTE — Telephone Encounter (Signed)
Called patient and advised to call back to discuss upcoming appointment. Left call back number.

## 2019-03-28 DIAGNOSIS — E1165 Type 2 diabetes mellitus with hyperglycemia: Secondary | ICD-10-CM | POA: Diagnosis not present

## 2019-03-28 DIAGNOSIS — N183 Chronic kidney disease, stage 3 (moderate): Secondary | ICD-10-CM | POA: Diagnosis not present

## 2019-03-28 DIAGNOSIS — I130 Hypertensive heart and chronic kidney disease with heart failure and stage 1 through stage 4 chronic kidney disease, or unspecified chronic kidney disease: Secondary | ICD-10-CM | POA: Diagnosis not present

## 2019-03-28 DIAGNOSIS — E1122 Type 2 diabetes mellitus with diabetic chronic kidney disease: Secondary | ICD-10-CM | POA: Diagnosis not present

## 2019-03-28 DIAGNOSIS — I5033 Acute on chronic diastolic (congestive) heart failure: Secondary | ICD-10-CM | POA: Diagnosis not present

## 2019-03-28 NOTE — Progress Notes (Signed)
Cardiology Office Note   Date:  03/29/2019   ID:  Charles [name], DOB [birth year], MRN [id number]  PCP:  [name], MD  Cardiologist:  Dr.Berry EP: Dr. Caryl Comes  Chief complaint: Posthospitalization follow-up-decompensated CHF   History of Present Illness: Charles [name] is a 85 y.o. male who presents for posthospitalization follow-up after admission for decompensated CHF with symptoms of lower extremity edema abdominal swelling and dyspnea.  The patient has a history of CAD status post CABG x4 in 2004 with LIMA to LAD, SVG to right coronary artery, OM branch and and ramus branch, renal artery stenosis with 70% bilateral stenosis, left carotid external artery stenosis, hypertension, hyperlipidemia, type 2 diabetes, history of atrial flutter with TEE/DCCV in the past.  Most recent cardiac catheterization in January 2019 revealed moderate left ear with patent grafts.  The patient did undergo TAVR with [employer] bioprosthetic valve.  He remains on Eliquis for atrial flutter anticoagulation for prophylaxis.  During hospitalization he was found to be COVID negative.  On day of discharge on 03/22/2019 the patient's weight was 216 pounds (net loss of 5.2 L with admission weight of 222 pounds).  The patient was transitioned back to Demadex 60 mg daily.  He was arranged for home O2 on discharge due to desaturation episodes while undergoing physical therapy.  He will need a follow-up BMET on this appointment today.  He is to have an outpatient follow-up with Dr. Caryl Comes to discuss possible antiarrhythmic options for ongoing atrial flutter.  He also is status post pacemaker implantation after AV block following TAVR and will continue to have follow-up with EP.  He comes today feeling better but still has some fatigue.  He has been medically compliant as his daughter is with him helping him with this.  He has not been weighing daily although he has been given a scale and is to call in weight  through his insurance company [employer].  He denies chest pain.  He remains on oxygen 2 L via nasal cannula but is not wearing it today.  He denies bleeding or bruising on Eliquis, no hemoptysis.  Past Medical History:  Diagnosis Date   Abdominal aortic atherosclerosis ([place])    by xray   Arthritis    in lower back (06/24/2015)   Atrial flutter ([place])    notes 06/24/2015   AV block, [place]itz 1 09/06/2017   Archie Endo 09/06/2017   BRVO (branch retinal vein occlusion) 2015   bilateral [facility])   CAD (coronary artery disease) 2004   a. s/p CABG in 2004 with LIMA-LAD, SVG-RCA, SVG-OM, and SVG-RI   Carotid stenosis 1999   s/p L CEA   Chronic diastolic CHF (congestive heart failure) ([place])    Colon polyp 2005   (Dr. 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   Edwards Sapien 3 THV (size 26 mm, model # 9600TFX, serial #  [id number]) via the TF approach   Severe aortic stenosis    T2DM (type 2 diabetes mellitus) ([place]) 1995   Urge incontinence of urine     Past Surgical History:  Procedure Laterality Date   CARDIAC CATHETERIZATION  03/06/2003   No intervention - recommend CABG   CARDIOVASCULAR STRESS TEST  11/2010   normal perfusion, no evidence of ischemia, EF 62% post exercise   CARDIOVASCULAR STRESS TEST  11/28/2012   Mild diaphragmatic attenuation; cannot exclude a focal region of nontransmural inferior scar   CARDIOVERSION N/A  06/25/2015   Procedure: CARDIOVERSION;  Surgeon: Pixie Casino, MD;  Location[facility] END[facility]COPY;  Service: Cardiovascular;  Laterality: N/A;   CARDIOVERSION N/A 11/02/2016   Procedure: CARDIOVERSION;  Surgeon: [name], MD;  Location: [place];  Service: Cardiovascular;  Laterality: N/A;   CARDIOVERSION N/A 03/20/2019   Procedure: CARDIOVERSION;  Surgeon: Jerline Pain, MD;  Location: [facility] ENDOSCOPY;  Service: Cardiovascular;  Laterality: N/A;   CAROTID ENDARTERECTOMY Left 1999   ([place])   CATARACT EXTRACTION W/ INTRAOCULAR LENS  IMPLANT, BILATERAL Bilateral 01/2013   [place]   COLONOSCOPY  2003   colon polyp x3 - adenomatous Tiffany Kocher)   COLONOSCOPY  10/08/2012   2 TA, diverticulosis, int hem, no rpt rec Tiffany Kocher)   CORONARY ANGIOPLASTY     CORONARY ARTERY BYPASS GRAFT  03/07/2003   4v CABG ([place]) with LIMA to LAD, vein graft to RCA, 1st obtuse marginal, and ramus intermedius   ESOPHAGOGASTRODUODENOSCOPY  10/08/2012   nl esophagus, duodenitis and erosive gastropathy, path - gastropathy no Hpylori, no rpt rec   LUMBAR EPIDURAL INJECTION  03/2017   L5/S1 ([place])   PACEMAKER IMPLANT N/A 04/21/2018   Procedure: PACEMAKER IMPLANT -- Dual Chamber;  Surgeon: Deboraha Sprang, MD;  Location: [facility] CV LAB;  Service: Cardiovascular;  Laterality: N/A;   PERIPHERAL VASCULAR CATHETERIZATION N/A 02/02/2016   Procedure: Renal Angiography;  Surgeon: [name], MD;  Location: [place] CV LAB;  Service: Cardiovascular;  Laterality: N/A;   PERIPHERAL VASCULAR CATHETERIZATION N/A 02/02/2016   Procedure: Abdominal Aortogram;  Surgeon: [name], MD;  Location: [place] CV LAB;  Service: Cardiovascular;  Laterality: N/A;   RENAL DOPPLER  11/29/2011   Celiac&SMA-demonstrated vessel narrowing suggestive of a greater than 50% diameter reduction. Bilateral renal arteries-demonstrated vessel narrowing of 60-99% diameter reduction. Rt Kidney-mid pole lateral simple cyst noted  measuring 1.29x0.76x1.11cm and exophytic cyst outside lower pole measuring 1.23x0.96x1.31. Lft Kidney-lateral mid to lower pole simple cyst measuering-1.24x9.83x1.24   RIGHT/LEFT HEART CATH AND CORONARY/GRAFT ANGIOGRAPHY N/A 10/20/2017   Procedure: RIGHT/LEFT HEART CATH AND CORONARY/GRAFT ANGIOGRAPHY;  Surgeon: Burnell Blanks, MD;  Location: [place] CV LAB;  Service: Cardiovascular;  Laterality: N/A;   TEE WITHOUT CARDIOVERSION N/A 06/25/2015   Procedure: TRANSESOPHAGEAL ECHOCARDIOGRAM (TEE);  Surgeon: Pixie Casino, MD;  Location[facility] ENDOSCOPY;  Service: Cardiovascular;  Laterality: N/A;   TEE WITHOUT CARDIOVERSION N/A 11/02/2016   Procedure: TRANSESOPHAGEAL ECHOCARDIOGRAM (TEE);  Surgeon: [name], MD;  Location: [facility];  Service: Cardiovascular;  Laterality: N/A;   TEE WITHOUT CARDIOVERSION N/A 12/05/2018   Procedure: TRANSESOPHAGEAL ECHOCARDIOGRAM (TEE);  Surgeon: Sherren Mocha, MD;  Location: [facility];  Service: Open Heart Surgery;  Laterality: N/A;   TONSILLECTOMY     TRANSCATHETER AORTIC VALVE REPLACEMENT, TRANSFEMORAL N/A 12/05/2018   Procedure: TRANSCATHETER AORTIC VALVE REPLACEMENT, TRANSFEMORAL;  Surgeon: Sherren Mocha, MD;  Location: [facility];  Service: Open Heart Surgery;  Laterality: N/A;   UPPER GASTROINTESTINAL ENDOSCOPY  2003  reflux esophagitis, erosive gastropathy, duodenal ulcer     Current Outpatient Medications  Medication Sig Dispense Refill   acetaminophen (TYLENOL) 500 MG tablet Take 1 tablet (500 mg total) by mouth 3 (three) times daily.     atorvastatin (LIPITOR) 20 MG tablet Take 1 tablet (20 mg total) by mouth daily. 90 tablet 3   BD INSULIN SYRINGE U/F 30G X 1/2" 0.5 ML MISC AS DIRECTED. 100 each 0   citalopram (CELEXA) 20 MG tablet Take 1 tablet (20 mg total) by mouth daily. 90 tablet 3   ELIQUIS 5 MG TABS tablet Take 1 tablet (5 mg total) by mouth 2 (two) times daily. (Patient taking differently: Take 5 mg by mouth 2 (two) times  daily. ) 60 tablet 3   finasteride (PROSCAR) 5 MG tablet Take 1 tablet (5 mg total) by mouth daily. 30 tablet 2   glucose blood (ACCU-CHEK AVIVA PLUS) test strip 1 each by Other route as needed for other. Use as instructed to check blood sugar 3 (three) times a day.  Dx code:  E11.8, E11.65 300 each 0   guaiFENesin (MUCINEX) 600 MG 12 hr tablet Take 600 mg by mouth 2 (two) times daily as needed for cough or to loosen phlegm.     LANTUS 100 UNIT/ML injection Inject 0.2 mLs (20 Units total) into the skin at bedtime. (Patient taking differently: Inject 20 Units into the skin at bedtime. ) 10 mL 2   lisinopril (PRINIVIL,ZESTRIL) 40 MG tablet Take 1 tablet (40 mg total) by mouth daily. 30 tablet 11   metFORMIN (GLUCOPHAGE) 1000 MG tablet Take 1 tablet (1,000 mg total) by mouth 2 (two) times daily with a meal. 180 tablet 0   nitroGLYCERIN (NITROSTAT) 0.4 MG SL tablet Place 1 tablet (0.4 mg total) under the tongue every 5 (five) minutes as needed for chest pain. 90 tablet 3   pantoprazole (PROTONIX) 20 MG tablet Take 1 tablet (20 mg total) by mouth daily. 90 tablet 3   Potassium Chloride ER 20 MEQ TBCR Take 20 mEq by mouth every Monday, Wednesday, Friday, Saturday, and Sunday at 6 PM. 30 tablet 5   tamsulosin (FLOMAX) 0.4 MG CAPS capsule Take 1 capsule (0.4 mg total) by mouth daily after supper. 30 capsule 6   torsemide (DEMADEX) 20 MG tablet Take 3 tablets (60 mg total) by mouth daily. 90 tablet 1   traZODone (DESYREL) 50 MG tablet Take 1 tablet (50 mg total) by mouth at bedtime. 90 tablet 3   No current facility-administered medications for this visit.     Allergies:   Gabapentin, Metoprolol, Spironolactone, Amlodipine, Other, Rosiglitazone maleate, and Tricor [fenofibrate]    Social History:  The patient  reports that he quit smoking about 5 years ago. His smoking use included cigarettes. He has a 18.00 pack-year smoking history. He has never used smokeless tobacco. He reports that he  does not drink alcohol or use drugs.   Family History:  The patient's family history includes Cancer in his brother, brother, maternal grandmother, and mother; Diabetes in his sister; Heart attack in his maternal grandfather, paternal grandfather, and paternal grandmother; Heart disease in his father and sister; Hypertension in his father and sister; Lung disease in his brother; Stroke in his sister.    ROS: All other systems are reviewed and negative. Unless otherwise mentioned in H&P    PHYSICAL EXAM: VS:  BP (!) 142/79    Pulse 96    Ht 6\' 1"  (1.854 m)    Wt  219 lb 12.8 oz (99.7 kg)    BMI 29.00 kg/m  , BMI Body mass index is 29 kg/m. GEN: Well nourished, well developed, in no acute distress HEENT: normal Neck: no JVD, carotid bruits, or masses Cardiac: RRR; no murmurs, rubs, or gallops,no edema  Respiratory: Bilateral crackles in the bases into the middle lobes, no wheezes, no coughing. GI: soft, nontender, nondistended, + BS MS: no deformity or atrophy Skin: warm and dry, no rash Neuro:  Strength and sensation are intact Psych: euthymic mood, full affect   EKG: Not completed this office visit.  Recent Labs: 01/26/2019: NT-Pro BNP 1,162 03/19/2019: ALT 20; B Natriuretic Peptide 419.9; Hemoglobin 10.7; Platelets 142; TSH 1.707 03/20/2019: Magnesium 1.7 03/22/2019: BUN 25; Creatinine, Ser 1.48; Potassium 3.9; Sodium 138    Lipid Panel    Component Value Date/Time   CHOL 137 02/23/2019 1104   TRIG 283.0 (H) 02/23/2019 1104   TRIG 133 09/10/2011   HDL 30.40 (L) 02/23/2019 1104   CHOLHDL 5 02/23/2019 1104   VLDL 56.6 (H) 02/23/2019 1104   LDLCALC 73 06/08/2016 1423   LDLDIRECT 75.0 02/23/2019 1104      Wt Readings from Last 3 Encounters:  03/29/19 219 lb 12.8 oz (99.7 kg)  03/22/19 216 lb 4.3 oz (98.1 kg)  02/27/19 222 lb (100.7 kg)      Other studies Reviewed: Echocardiogram December 31, 2018 . The left ventricle has low normal systolic function, with an ejection fraction  of 50-55%. The cavity size was normal. There is moderately increased left ventricular wall thickness. Left ventricular diastolic Doppler parameters are consistent with  impaired relaxation. Elevated left atrial and left ventricular end-diastolic pressures The E/e' is >20. There is abnormal septal motion consistent with RV pacemaker.  2. Moderate hypokinesis of the left ventricular, basal-mid septal wall.  3. The mitral valve is degenerative. Mild thickening of the mitral valve leaflet. Moderate calcification of the mitral valve leaflet. There is moderate mitral annular calcification present. Mild mitral valve stenosis.  4. The tricuspid valve is grossly normal.  5. A 26 an Edwards Edwards Sapien bioprosthetic aortic valve (TAVR) valve is present in the aortic position. Procedure Date: 12/05/2018 Echo findings are consistent with mild perivalvular leak of the aortic prosthesis.  6. Moderate calcification of the aortic valve. mild stenosis of the aortic valve.  7. Left atrial size was moderately dilated.  8. Right atrial size was moderately dilated.  9. The right ventricle has normal systolic function. The cavity was normal. There is no increase in right ventricular wall thickness. 10. When compared to the prior study: 12/06/2018: LVEF 55-60%.  Device Check 02/16/2019 Device remote reviewed.  Remote is normal. Battery status is good. Lead measurements unchanged. Histograms are appropriate.  ASSESSMENT AND PLAN:  1.  Chronic diastolic heart failure: Status post hospitalization requiring IV diuresis.  Dry weight 216 pounds.  He is now on higher dose of diuretic torsemide 60 mg daily.  I have given him refills on that.  I have advised him to weigh himself daily and write down the weight at the same time every day.  If he gains 2 to 3 pounds in 24 hours to 48 hours he is to take an additional dose of the torsemide.  He and his daughter verbalized understanding.  He continues to eat salted foods, to  include sausage biscuits and foods from the outside that his daughter brings him from restaurants.  He is advised not to eat fast food and avoid the cured meats.  A low-sodium  diet is provided to him and his daughter.  2.  Coronary artery disease: History of CABG x4 in 2004 with LIMA to LAD, SVG to right coronary artery, OM branch and ramus branch.  He has renal artery stenosis with 70% bilateral stenosis. Current medicines are reviewed at length with the patient today.  He denies any complaints of chest pain but has generalized fatigue.  This may be from deconditioning.  He does use a walker for ambulation.  He has home health agency helping him with PT and OT along with nurses coming by to check his vital signs and weight.  3.  COPD: He is on oxygen 2 L via nasal cannula.  He appears uncomfortable wearing a facemask here in the office and does have some trouble breathing having to take it off each time he speaks.  They have forgotten to bring his oxygen connection device for the tank which is portable.  Oxygen is available here in the office which is been offered to him however he states he is feeling okay and will get back on his home oxygen when he gets home.  4.  Atrial fibrillation: Good rate control at this time.  He will continue on Eliquis as directed.  He is to follow-up with Dr. Caryl Comes in the office concerning atrial fib and discussion of other anti-arrhythmia medications on that visit.  5.  Pacemaker in situ: Also followed by Dr. Caryl Comes with remote checks.  Last remote check was completed on 02/16/2019.   Labs/ tests ordered today include: BMET- 1 month to be drawn by home health nurses who are already visiting.  Phill Myron. West Pugh, ANP, [facility]   03/29/2019 10:21 AM    [facility] [address] Office [phone] Fax [phone]

## 2019-03-29 ENCOUNTER — Encounter: Payer: Self-pay | Admitting: Adult Health

## 2019-03-29 ENCOUNTER — Ambulatory Visit (INDEPENDENT_AMBULATORY_CARE_PROVIDER_SITE_OTHER): Payer: Medicare Other | Admitting: Adult Health

## 2019-03-29 ENCOUNTER — Other Ambulatory Visit: Payer: Self-pay

## 2019-03-29 VITALS — BP 142/79 | HR 96 | Ht 73.0 in | Wt 219.8 lb

## 2019-03-29 DIAGNOSIS — Z79899 Other long term (current) drug therapy: Secondary | ICD-10-CM

## 2019-03-29 DIAGNOSIS — Z950 Presence of cardiac pacemaker: Secondary | ICD-10-CM | POA: Diagnosis not present

## 2019-03-29 DIAGNOSIS — I5032 Chronic diastolic (congestive) heart failure: Secondary | ICD-10-CM

## 2019-03-29 DIAGNOSIS — J42 Unspecified chronic bronchitis: Secondary | ICD-10-CM | POA: Diagnosis not present

## 2019-03-29 DIAGNOSIS — I6529 Occlusion and stenosis of unspecified carotid artery: Secondary | ICD-10-CM

## 2019-03-29 DIAGNOSIS — I2510 Atherosclerotic heart disease of native coronary artery without angina pectoris: Secondary | ICD-10-CM

## 2019-03-29 MED ORDER — TORSEMIDE 20 MG PO TABS: 60.0000 mg | ORAL_TABLET | Freq: Every day | ORAL | 1 refills | Status: DC

## 2019-03-29 NOTE — Addendum Note (Signed)
Addended by: Waylan Rocher on: 03/29/2019 03:54 PM   Modules accepted: Orders

## 2019-03-29 NOTE — Patient Instructions (Signed)
Follow-Up: You will need a follow up appointment in 3 months.  Please call our office 2 months in advance to schedule this appointment.  You may see [name], MD Jory Sims, DNP, AACC or one of the following Advanced Practice Providers on your designated Care Team:  Kerin Ransom, PA-C  Roby Lofts, PA-C  Sande Rives, [place]        Medication Instructions:  The current medical regimen is effective;  continue present plan and medications as directed. Please refer to the Current Medication list given to you today. If you need a refill on your cardiac medications before your next appointment, please call your pharmacy. Labwork: When you have labs (blood work) and your tests are completely normal, you will receive your results ONLY by [place] (if you have MyChart) -OR- A paper copy in the mail.  At [facility], you and your health needs are our priority.  As part of our continuing mission to provide you with exceptional heart care, we have created designated Provider Care Teams.  These Care Teams include your primary Cardiologist (physician) and Advanced Practice Providers (APPs -  Physician Assistants and Nurse Practitioners) who all work together to provide you with the care you need, when you need it.  Thank you for choosing [facility]!!      Low-Sodium Eating Plan Sodium, which is an element that makes up salt, helps you maintain a healthy balance of fluids in your body. Too much sodium can increase your blood pressure and cause fluid and waste to be held in your body. Your health care provider or dietitian may recommend following this plan if you have high blood pressure (hypertension), kidney disease, liver disease, or heart failure. Eating less sodium can help lower your blood pressure, reduce swelling, and protect your heart, liver, and kidneys. What are tips for following this plan? General guidelines  Most people on this plan should limit their  sodium intake to 1,500-2,000 mg (milligrams) of sodium each day. Reading food labels   The Nutrition Facts label lists the amount of sodium in one serving of the food. If you eat more than one serving, you must multiply the listed amount of sodium by the number of servings.  Choose foods with less than 140 mg of sodium per serving.  Avoid foods with 300 mg of sodium or more per serving. Shopping  Look for lower-sodium products, often labeled as "low-sodium" or "no salt added."  Always check the sodium content even if foods are labeled as "unsalted" or "no salt added".  Buy fresh foods. ? Avoid canned foods and premade or frozen meals. ? Avoid canned, cured, or processed meats  Buy breads that have less than 80 mg of sodium per slice. Cooking  Eat more home-cooked food and less restaurant, buffet, and fast food.  Avoid adding salt when cooking. Use salt-free seasonings or herbs instead of table salt or sea salt. Check with your health care provider or pharmacist before using salt substitutes.  Cook with plant-based oils, such as canola, sunflower, or olive oil. Meal planning  When eating at a restaurant, ask that your food be prepared with less salt or no salt, if possible.  Avoid foods that contain MSG (monosodium glutamate). MSG is sometimes added to [place] food, bouillon, and some canned foods. What foods are recommended? The items listed may not be a complete list. Talk with your dietitian about what dietary choices are best for you. Grains Low-sodium cereals, including oats, puffed wheat  and rice, and shredded wheat. Low-sodium crackers. Unsalted rice. Unsalted pasta. Low-sodium bread. Whole-grain breads and whole-grain pasta. Vegetables Fresh or frozen vegetables. "No salt added" canned vegetables. "No salt added" tomato sauce and paste. Low-sodium or reduced-sodium tomato and vegetable juice. Fruits Fresh, frozen, or canned fruit. Fruit juice. Meats and other protein  foods Fresh or frozen (no salt added) meat, poultry, seafood, and fish. Low-sodium canned tuna and salmon. Unsalted nuts. Dried peas, beans, and lentils without added salt. Unsalted canned beans. Eggs. Unsalted nut butters. Dairy Milk. Soy milk. Cheese that is naturally low in sodium, such as ricotta cheese, fresh mozzarella, or Swiss cheese Low-sodium or reduced-sodium cheese. Cream cheese. Yogurt. Fats and oils Unsalted butter. Unsalted margarine with no trans fat. Vegetable oils such as canola or olive oils. Seasonings and other foods Fresh and dried herbs and spices. Salt-free seasonings. Low-sodium mustard and ketchup. Sodium-free salad dressing. Sodium-free light mayonnaise. Fresh or refrigerated horseradish. Lemon juice. Vinegar. Homemade, reduced-sodium, or low-sodium soups. Unsalted popcorn and pretzels. Low-salt or salt-free chips. What foods are not recommended? The items listed may not be a complete list. Talk with your dietitian about what dietary choices are best for you. Grains Instant hot cereals. Bread stuffing, pancake, and biscuit mixes. Croutons. Seasoned rice or pasta mixes. Noodle soup cups. Boxed or frozen macaroni and cheese. Regular salted crackers. Self-rising flour. Vegetables Sauerkraut, pickled vegetables, and relishes. Olives. [place] fries. Onion rings. Regular canned vegetables (not low-sodium or reduced-sodium). Regular canned tomato sauce and paste (not low-sodium or reduced-sodium). Regular tomato and vegetable juice (not low-sodium or reduced-sodium). Frozen vegetables in sauces. Meats and other protein foods Meat or fish that is salted, canned, smoked, spiced, or pickled. Bacon, ham, sausage, hotdogs, corned beef, chipped beef, packaged lunch meats, salt pork, jerky, pickled herring, anchovies, regular canned tuna, sardines, salted nuts. Dairy Processed cheese and cheese spreads. Cheese curds. Blue cheese. Feta cheese. String cheese. Regular cottage cheese.  Buttermilk. Canned milk. Fats and oils Salted butter. Regular margarine. Ghee. Bacon fat. Seasonings and other foods Onion salt, garlic salt, seasoned salt, table salt, and sea salt. Canned and packaged gravies. Worcestershire sauce. Tartar sauce. Barbecue sauce. Teriyaki sauce. Soy sauce, including reduced-sodium. Steak sauce. Fish sauce. Oyster sauce. Cocktail sauce. Horseradish that you find on the shelf. Regular ketchup and mustard. Meat flavorings and tenderizers. Bouillon cubes. Hot sauce and Tabasco sauce. Premade or packaged marinades. Premade or packaged taco seasonings. Relishes. Regular salad dressings. Salsa. Potato and tortilla chips. Corn chips and puffs. Salted popcorn and pretzels. Canned or dried soups. Pizza. Frozen entrees and pot pies. Summary  Eating less sodium can help lower your blood pressure, reduce swelling, and protect your heart, liver, and kidneys.  Most people on this plan should limit their sodium intake to 1,500-2,000 mg (milligrams) of sodium each day.  Canned, boxed, and frozen foods are high in sodium. Restaurant foods, fast foods, and pizza are also very high in sodium. You also get sodium by adding salt to food.  Try to cook at home, eat more fresh fruits and vegetables, and eat less fast food, canned, processed, or prepared foods. This information is not intended to replace advice given to you by your health care provider. Make sure you discuss any questions you have with your health care provider. Document Released: 03/12/2002 Document Revised: 09/13/2016 Document Reviewed: 09/13/2016 Elsevier Interactive Patient Education  2019 [employer].

## 2019-03-30 DIAGNOSIS — N183 Chronic kidney disease, stage 3 (moderate): Secondary | ICD-10-CM | POA: Diagnosis not present

## 2019-03-30 DIAGNOSIS — I130 Hypertensive heart and chronic kidney disease with heart failure and stage 1 through stage 4 chronic kidney disease, or unspecified chronic kidney disease: Secondary | ICD-10-CM | POA: Diagnosis not present

## 2019-03-30 DIAGNOSIS — E1165 Type 2 diabetes mellitus with hyperglycemia: Secondary | ICD-10-CM | POA: Diagnosis not present

## 2019-03-30 DIAGNOSIS — I5033 Acute on chronic diastolic (congestive) heart failure: Secondary | ICD-10-CM | POA: Diagnosis not present

## 2019-03-30 DIAGNOSIS — E1122 Type 2 diabetes mellitus with diabetic chronic kidney disease: Secondary | ICD-10-CM | POA: Diagnosis not present

## 2019-04-02 ENCOUNTER — Other Ambulatory Visit: Payer: Self-pay | Admitting: Family Medicine

## 2019-04-03 DIAGNOSIS — N183 Chronic kidney disease, stage 3 (moderate): Secondary | ICD-10-CM | POA: Diagnosis not present

## 2019-04-03 DIAGNOSIS — E1122 Type 2 diabetes mellitus with diabetic chronic kidney disease: Secondary | ICD-10-CM | POA: Diagnosis not present

## 2019-04-03 DIAGNOSIS — I5033 Acute on chronic diastolic (congestive) heart failure: Secondary | ICD-10-CM | POA: Diagnosis not present

## 2019-04-03 DIAGNOSIS — E1165 Type 2 diabetes mellitus with hyperglycemia: Secondary | ICD-10-CM | POA: Diagnosis not present

## 2019-04-03 DIAGNOSIS — I130 Hypertensive heart and chronic kidney disease with heart failure and stage 1 through stage 4 chronic kidney disease, or unspecified chronic kidney disease: Secondary | ICD-10-CM | POA: Diagnosis not present

## 2019-04-04 DIAGNOSIS — E1122 Type 2 diabetes mellitus with diabetic chronic kidney disease: Secondary | ICD-10-CM | POA: Diagnosis not present

## 2019-04-04 DIAGNOSIS — N183 Chronic kidney disease, stage 3 (moderate): Secondary | ICD-10-CM | POA: Diagnosis not present

## 2019-04-04 DIAGNOSIS — E1165 Type 2 diabetes mellitus with hyperglycemia: Secondary | ICD-10-CM | POA: Diagnosis not present

## 2019-04-04 DIAGNOSIS — I5033 Acute on chronic diastolic (congestive) heart failure: Secondary | ICD-10-CM | POA: Diagnosis not present

## 2019-04-04 DIAGNOSIS — I130 Hypertensive heart and chronic kidney disease with heart failure and stage 1 through stage 4 chronic kidney disease, or unspecified chronic kidney disease: Secondary | ICD-10-CM | POA: Diagnosis not present

## 2019-04-05 ENCOUNTER — Telehealth: Payer: Self-pay

## 2019-04-05 DIAGNOSIS — Z794 Long term (current) use of insulin: Secondary | ICD-10-CM

## 2019-04-05 DIAGNOSIS — I2510 Atherosclerotic heart disease of native coronary artery without angina pectoris: Secondary | ICD-10-CM

## 2019-04-05 DIAGNOSIS — E7800 Pure hypercholesterolemia, unspecified: Secondary | ICD-10-CM

## 2019-04-05 DIAGNOSIS — L408 Other psoriasis: Secondary | ICD-10-CM

## 2019-04-05 DIAGNOSIS — I4892 Unspecified atrial flutter: Secondary | ICD-10-CM

## 2019-04-05 DIAGNOSIS — Z9181 History of falling: Secondary | ICD-10-CM

## 2019-04-05 DIAGNOSIS — E785 Hyperlipidemia, unspecified: Secondary | ICD-10-CM

## 2019-04-05 DIAGNOSIS — Z86010 Personal history of colonic polyps: Secondary | ICD-10-CM

## 2019-04-05 DIAGNOSIS — Z952 Presence of prosthetic heart valve: Secondary | ICD-10-CM

## 2019-04-05 DIAGNOSIS — N183 Chronic kidney disease, stage 3 (moderate): Secondary | ICD-10-CM | POA: Diagnosis not present

## 2019-04-05 DIAGNOSIS — Z87891 Personal history of nicotine dependence: Secondary | ICD-10-CM

## 2019-04-05 DIAGNOSIS — K219 Gastro-esophageal reflux disease without esophagitis: Secondary | ICD-10-CM

## 2019-04-05 DIAGNOSIS — F331 Major depressive disorder, recurrent, moderate: Secondary | ICD-10-CM

## 2019-04-05 DIAGNOSIS — E1165 Type 2 diabetes mellitus with hyperglycemia: Secondary | ICD-10-CM | POA: Diagnosis not present

## 2019-04-05 DIAGNOSIS — E1122 Type 2 diabetes mellitus with diabetic chronic kidney disease: Secondary | ICD-10-CM | POA: Diagnosis not present

## 2019-04-05 DIAGNOSIS — I5033 Acute on chronic diastolic (congestive) heart failure: Secondary | ICD-10-CM | POA: Diagnosis not present

## 2019-04-05 DIAGNOSIS — Z7901 Long term (current) use of anticoagulants: Secondary | ICD-10-CM

## 2019-04-05 DIAGNOSIS — I130 Hypertensive heart and chronic kidney disease with heart failure and stage 1 through stage 4 chronic kidney disease, or unspecified chronic kidney disease: Secondary | ICD-10-CM | POA: Diagnosis not present

## 2019-04-05 DIAGNOSIS — M47816 Spondylosis without myelopathy or radiculopathy, lumbar region: Secondary | ICD-10-CM

## 2019-04-05 DIAGNOSIS — N529 Male erectile dysfunction, unspecified: Secondary | ICD-10-CM

## 2019-04-05 DIAGNOSIS — Z9981 Dependence on supplemental oxygen: Secondary | ICD-10-CM

## 2019-04-05 DIAGNOSIS — Z87440 Personal history of urinary (tract) infections: Secondary | ICD-10-CM

## 2019-04-05 DIAGNOSIS — Z951 Presence of aortocoronary bypass graft: Secondary | ICD-10-CM

## 2019-04-05 DIAGNOSIS — M5116 Intervertebral disc disorders with radiculopathy, lumbar region: Secondary | ICD-10-CM

## 2019-04-05 DIAGNOSIS — J9811 Atelectasis: Secondary | ICD-10-CM

## 2019-04-05 NOTE — Telephone Encounter (Signed)
I called and left patient a message about upcoming appointment on 04/10/19 with Dr. Caryl Comes. Does patient want to keep appointment as virtual or come into the office?

## 2019-04-06 DIAGNOSIS — E1165 Type 2 diabetes mellitus with hyperglycemia: Secondary | ICD-10-CM | POA: Diagnosis not present

## 2019-04-06 DIAGNOSIS — N183 Chronic kidney disease, stage 3 (moderate): Secondary | ICD-10-CM | POA: Diagnosis not present

## 2019-04-06 DIAGNOSIS — I130 Hypertensive heart and chronic kidney disease with heart failure and stage 1 through stage 4 chronic kidney disease, or unspecified chronic kidney disease: Secondary | ICD-10-CM | POA: Diagnosis not present

## 2019-04-06 DIAGNOSIS — I5033 Acute on chronic diastolic (congestive) heart failure: Secondary | ICD-10-CM | POA: Diagnosis not present

## 2019-04-06 DIAGNOSIS — E1122 Type 2 diabetes mellitus with diabetic chronic kidney disease: Secondary | ICD-10-CM | POA: Diagnosis not present

## 2019-04-09 ENCOUNTER — Telehealth: Payer: Self-pay | Admitting: Internal Medicine

## 2019-04-09 ENCOUNTER — Other Ambulatory Visit: Payer: Self-pay | Admitting: Family Medicine

## 2019-04-09 ENCOUNTER — Other Ambulatory Visit: Payer: Self-pay | Admitting: Cardiovascular Disease

## 2019-04-09 DIAGNOSIS — N183 Chronic kidney disease, stage 3 (moderate): Secondary | ICD-10-CM | POA: Diagnosis not present

## 2019-04-09 DIAGNOSIS — I130 Hypertensive heart and chronic kidney disease with heart failure and stage 1 through stage 4 chronic kidney disease, or unspecified chronic kidney disease: Secondary | ICD-10-CM | POA: Diagnosis not present

## 2019-04-09 DIAGNOSIS — E1165 Type 2 diabetes mellitus with hyperglycemia: Secondary | ICD-10-CM | POA: Diagnosis not present

## 2019-04-09 DIAGNOSIS — E1122 Type 2 diabetes mellitus with diabetic chronic kidney disease: Secondary | ICD-10-CM | POA: Diagnosis not present

## 2019-04-09 DIAGNOSIS — I5033 Acute on chronic diastolic (congestive) heart failure: Secondary | ICD-10-CM | POA: Diagnosis not present

## 2019-04-09 NOTE — Telephone Encounter (Signed)

## 2019-04-09 NOTE — Telephone Encounter (Signed)
Rx(s) sent to pharmacy electronically.  

## 2019-04-10 ENCOUNTER — Telehealth (INDEPENDENT_AMBULATORY_CARE_PROVIDER_SITE_OTHER): Payer: Medicare Other | Admitting: Internal Medicine

## 2019-04-10 ENCOUNTER — Encounter: Payer: Self-pay | Admitting: Internal Medicine

## 2019-04-10 ENCOUNTER — Other Ambulatory Visit: Payer: Self-pay

## 2019-04-10 ENCOUNTER — Other Ambulatory Visit: Payer: Self-pay | Admitting: Family Medicine

## 2019-04-10 VITALS — BP 109/73 | HR 90 | Ht 73.0 in | Wt 211.0 lb

## 2019-04-10 DIAGNOSIS — I2510 Atherosclerotic heart disease of native coronary artery without angina pectoris: Secondary | ICD-10-CM

## 2019-04-10 DIAGNOSIS — I4891 Unspecified atrial fibrillation: Secondary | ICD-10-CM

## 2019-04-10 DIAGNOSIS — I441 Atrioventricular block, second degree: Secondary | ICD-10-CM

## 2019-04-10 DIAGNOSIS — I5032 Chronic diastolic (congestive) heart failure: Secondary | ICD-10-CM

## 2019-04-10 DIAGNOSIS — Z950 Presence of cardiac pacemaker: Secondary | ICD-10-CM

## 2019-04-10 NOTE — Patient Instructions (Signed)
Medication Instructions:  Your physician recommends that you continue on your current medications as directed. Please refer to the Current Medication list given to you today.  Labwork: Your physician recommends that you return for lab work in:   CBC and Ferritin the week of July 20th. Please take your lab orders to any local [facility]  Testing/Procedures: None ordered.  Follow-Up: Your physician recommends that you schedule a follow-up appointment in:   12 months with Dr Caryl Comes  Any Other Special Instructions Will Be Listed Below (If Applicable).     If you need a refill on your cardiac medications before your next appointment, please call your pharmacy.

## 2019-04-10 NOTE — Telephone Encounter (Signed)
02/28/19 did virtual visit and FU on DM. Refilled per protocol. # 300 x 1 to [facility].

## 2019-04-10 NOTE — Telephone Encounter (Signed)
Patient would like to keep office visit on 04/10/19 with Dr. Caryl Comes as a telehealth visit.

## 2019-04-10 NOTE — Progress Notes (Signed)
Electrophysiology TeleHealth Note   Due to national recommendations of social distancing due to COVID 19, an audio/video telehealth visit is felt to be most appropriate for this patient at this time.  See MyChart message from today for the patient's consent to telehealth for [facility].   Date:  04/10/2019   ID:  Charles [name], DOB [birth year], MRN [id number]  Location: patient's home  Provider location: [address], [place]  Evaluation Performed: Follow-up visit  PCP:  [name], MD  Cardiologist:   Hiram Comber Electrophysiologist:  SK   Chief Complaint:  Atrial flutter and heart block   History of Present Illness:    Charles [name] is a 85 y.o. male who presents via audio/video conferencing for a telehealth visit today. The patient did not have access to video technology/had technical difficulties with video requiring transitioning to audio format only (telephone).  All issues noted in this document were discussed and addressed.  No physical exam could be performed with this format.      Since last being seen in our clinic for recurrent atrial flutter-typical high-grade heart block previously implanted pacemaker (2019) and TAVR (1/19), the patient reports having done better following recent hospitalization for acute/chronic heart failure with recurrent atrial flutter  He had presented with shortness of breath-primarily PND.  Was discharged on nocturnal oxygen and has had no recurrences.  No history of edema.  Denies chest pain.  Continued on apixoban    DATE TEST EF   1/18 Echo 30-35%   1/19 LHC  Patent LIMA/ SVG 3/3  6/19 Echo 60-65% LVH-severe AS Severe/ MS mild/LAE mod  3/20 Echo  50-55% E/E'>20 Mod BAE    Date Cr K Hgb  6/20 1.48 3.9 10.7<<12.1              The patient denies symptoms of fevers, chills, cough, or new SOB worrisome for COVID 19.    Past Medical History:  Diagnosis Date  . Abdominal aortic atherosclerosis ([place])    by  xray  . Arthritis    in lower back (06/24/2015)  . Atrial flutter ([place])    notes 06/24/2015  . AV block, [place] 1 09/06/2017   Archie Endo 09/06/2017  . 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 CAD (coronary artery disease) 2004   a. s/p CABG in 2004 with LIMA-LAD, SVG-RCA, SVG-OM, and SVG-RI  . Carotid stenosis 1999   s/p L CEA  . Chronic diastolic CHF (congestive heart failure) ([place])   . Colon polyp 2005   (Dr. Tiffany Kocher)  . Compression fracture of L1 lumbar vertebra (HCC) remote  . COPD (chronic obstructive pulmonary disease) ([place]) 03/2011   by xray  . Depression    hx  . GERD (gastroesophageal reflux disease) 2003   h/o duodenal ulcer per EGD as well as esophagitis  . History of colon polyps 2003, 2005   adenomatous Vira Agar)  . History of tobacco abuse   . HLD (hyperlipidemia)   . HTN (hypertension)   . Presence of permanent cardiac pacemaker   . Psoriasis   . Renal artery stenosis ([place]) 2004   70% bilateral, followed by cards  . 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 Severe aortic stenosis   . T2DM (type 2 diabetes mellitus) ([place]) 1995  . Urge incontinence of urine     Past  Surgical History:  Procedure Laterality Date  . CARDIAC CATHETERIZATION  03/06/2003   No intervention - recommend CABG  . CARDIOVASCULAR STRESS TEST  11/2010   normal perfusion, no evidence of ischemia, EF 62% post exercise  . CARDIOVASCULAR STRESS TEST  11/28/2012   Mild diaphragmatic attenuation; cannot exclude a focal region of nontransmural inferior scar  . CARDIOVERSION N/A 06/25/2015   Procedure: CARDIOVERSION;  Surgeon: Pixie Casino, MD;  Location[facility] ENDOSCOPY;  Service: Cardiovascular;  Laterality: N/A;  . CARDIOVERSION N/A 11/02/2016   Procedure: CARDIOVERSION;  Surgeon: [name], MD;  Location: [place];  Service: Cardiovascular;  Laterality:  N/A;  . CARDIOVERSION N/A 03/20/2019   Procedure: CARDIOVERSION;  Surgeon: Jerline Pain, MD;  Location: [place];  Service: Cardiovascular;  Laterality: N/A;  . CAROTID ENDARTERECTOMY Left 1999   ([place])  . CATARACT EXTRACTION W/ INTRAOCULAR LENS  IMPLANT, BILATERAL Bilateral 01/2013   [place]  . COLONOSCOPY  2003   colon polyp x3 - adenomatous Tiffany Kocher)  . COLONOSCOPY  10/08/2012   2 TA, diverticulosis, int hem, no rpt rec Tiffany Kocher)  . CORONARY ANGIOPLASTY    . CORONARY ARTERY BYPASS GRAFT  03/07/2003   4v CABG ([place]) with LIMA to LAD, vein graft to RCA, 1st obtuse marginal, and ramus intermedius  . ESOPHAGOGASTRODUODENOSCOPY  10/08/2012   nl esophagus, duodenitis and erosive gastropathy, path - gastropathy no Hpylori, no rpt rec  . LUMBAR EPIDURAL INJECTION  03/2017   L5/S1 ([place])  . PACEMAKER IMPLANT N/A 04/21/2018   Procedure: PACEMAKER IMPLANT -- Dual Chamber;  Surgeon: Deboraha Sprang, MD;  Location: [place] CV LAB;  Service: Cardiovascular;  Laterality: N/A;  . PERIPHERAL VASCULAR CATHETERIZATION N/A 02/02/2016   Procedure: Renal Angiography;  Surgeon: [name], MD;  Location: [place] CV LAB;  Service: Cardiovascular;  Laterality: N/A;  . PERIPHERAL VASCULAR CATHETERIZATION N/A 02/02/2016   Procedure: Abdominal Aortogram;  Surgeon: [name], MD;  Location: [place] CV LAB;  Service: Cardiovascular;  Laterality: N/A;  . RENAL DOPPLER  11/29/2011   Celiac&SMA-demonstrated vessel narrowing suggestive of a greater than 50% diameter reduction. Bilateral renal arteries-demonstrated vessel narrowing of 60-99% diameter reduction. Rt Kidney-mid pole lateral simple cyst noted measuring 1.29x0.76x1.11cm and exophytic cyst outside lower pole measuring 1.23x0.96x1.31. Lft Kidney-lateral mid to lower pole simple cyst measuering-1.24x9.83x1.24  . RIGHT/LEFT HEART CATH AND CORONARY/GRAFT ANGIOGRAPHY N/A 10/20/2017   Procedure: RIGHT/LEFT HEART CATH AND CORONARY/GRAFT  ANGIOGRAPHY;  Surgeon: Burnell Blanks, MD;  Location: [place] CV LAB;  Service: Cardiovascular;  Laterality: N/A;  . TEE WITHOUT CARDIOVERSION N/A 06/25/2015   Procedure: TRANSESOPHAGEAL ECHOCARDIOGRAM (TEE);  Surgeon: Pixie Casino, MD;  Location: [facility] ENDOSCOPY;  Service: Cardiovascular;  Laterality: N/A;  . TEE WITHOUT CARDIOVERSION N/A 11/02/2016   Procedure: TRANSESOPHAGEAL ECHOCARDIOGRAM (TEE);  Surgeon: [name], MD;  Location: [place];  Service: Cardiovascular;  Laterality: N/A;  . TEE WITHOUT CARDIOVERSION N/A 12/05/2018   Procedure: TRANSESOPHAGEAL ECHOCARDIOGRAM (TEE);  Surgeon: Sherren Mocha, MD;  Location: [place];  Service: Open Heart Surgery;  Laterality: N/A;  . TONSILLECTOMY    . TRANSCATHETER AORTIC VALVE REPLACEMENT, TRANSFEMORAL N/A 12/05/2018   Procedure: TRANSCATHETER AORTIC VALVE REPLACEMENT, TRANSFEMORAL;  Surgeon: Sherren Mocha, MD;  Location: [place];  Service: Open Heart Surgery;  Laterality: N/A;  . UPPER GASTROINTESTINAL ENDOSCOPY  2003   reflux esophagitis, erosive gastropathy, duodenal ulcer    Current Outpatient Medications  Medication Sig Dispense Refill  . acetaminophen (TYLENOL) 500 MG tablet Take 1 tablet (500 mg  total) by mouth 3 (three) times daily.    [name] atorvastatin (LIPITOR) 20 MG tablet Take 1 tablet (20 mg total) by mouth daily. 90 tablet 3  . BD INSULIN SYRINGE U/F 30G X 1/2" 0.5 ML MISC AS DIRECTED. 100 each 0  . citalopram (CELEXA) 20 MG tablet Take 1 tablet (20 mg total) by mouth daily. 90 tablet 3  . ELIQUIS 5 MG TABS tablet Take 1 tablet (5 mg total) by mouth 2 (two) times daily. 60 tablet 3  . finasteride (PROSCAR) 5 MG tablet Take 1 tablet (5 mg total) by mouth daily. 30 tablet 5  . glucose blood (ACCU-CHEK AVIVA PLUS) test strip Use as instructed to check blood sugar three times a day. Dx code E11.8 & E11.65 300 strip 1  . guaiFENesin (MUCINEX) 600 MG 12 hr tablet Take 600 mg by mouth 2 (two) times daily as needed for cough  or to loosen phlegm.    [name] LANTUS 100 UNIT/ML injection Inject 0.2 mLs (20 Units total) into the skin at bedtime. 10 mL 2  . lisinopril (PRINIVIL,ZESTRIL) 40 MG tablet Take 1 tablet (40 mg total) by mouth daily. 30 tablet 11  . metFORMIN (GLUCOPHAGE) 1000 MG tablet Take 1 tablet (1,000 mg total) by mouth 2 (two) times daily with a meal. 180 tablet 0  . nitroGLYCERIN (NITROSTAT) 0.4 MG SL tablet Place 1 tablet (0.4 mg total) under the tongue every 5 (five) minutes as needed for chest pain. 90 tablet 3  . pantoprazole (PROTONIX) 20 MG tablet Take 1 tablet (20 mg total) by mouth daily. 90 tablet 3  . potassium chloride SA (K-DUR) 20 MEQ tablet Take 1 tablet (20 mEq total) every Monday, Wednesday, Friday, Saturday, and Sunday. 30 tablet 11  . tamsulosin (FLOMAX) 0.4 MG CAPS capsule Take 1 capsule (0.4 mg total) by mouth daily after supper. 30 capsule 5  . torsemide (DEMADEX) 20 MG tablet Take 3 tablets (60 mg total) by mouth daily. 90 tablet 1  . traZODone (DESYREL) 50 MG tablet Take 1 tablet (50 mg total) by mouth at bedtime. 90 tablet 3   No current facility-administered medications for this visit.     Allergies:   Gabapentin, Metoprolol, Spironolactone, Amlodipine, Other, Rosiglitazone maleate, and Tricor [fenofibrate]   Social History:  The patient  reports that he quit smoking about 5 years ago. His smoking use included cigarettes. He has a 18.00 pack-year smoking history. He has never used smokeless tobacco. He reports that he does not drink alcohol or use drugs.   Family History:  The patient's   family history includes Cancer in his brother, brother, maternal grandmother, and mother; Diabetes in his sister; Heart attack in his maternal grandfather, paternal grandfather, and paternal grandmother; Heart disease in his father and sister; Hypertension in his father and sister; Lung disease in his brother; Stroke in his sister.   ROS:  Please see the history of present illness.   All other systems  are personally reviewed and negative.    Exam:    Vital Signs:  BP 109/73   Pulse 90   Ht 6\' 1"  (1.854 m)   Wt 211 lb (95.7 kg)   BMI 27.84 kg/m     Labs/Other Tests and Data Reviewed:    Recent Labs: 01/26/2019: NT-Pro BNP 1,162 03/19/2019: ALT 20; B Natriuretic Peptide 419.9; Hemoglobin 10.7; Platelets 142; TSH 1.707 03/20/2019: Magnesium 1.7 03/22/2019: BUN 25; Creatinine, Ser 1.48; Potassium 3.9; Sodium 138   Wt Readings from Last 3 Encounters:  04/10/19 211 lb (95.7 kg)  03/29/19 219 lb 12.8 oz (99.7 kg)  03/22/19 216 lb 4.3 oz (98.1 kg)     Other studies personally reviewed: As above *    Last device remote is reviewed from [facility] PDF dated 5/20 which reveals normal device function,   arrhythmias - non sustained atrial tach all < 8min   ASSESSMENT & PLAN:    High-grade heart block  Aortic stenosis  Ischemic heart disease with prior bypass  Pacemaker-St. Jude   Cardiomyopathy-mild  Anemia    Recurrent atrial flutter.  Given the relative infrequency would continue with intermittent cardioversion.  Pushing back on this idea is his mild anemia as noted in the graph above which is been progressive.  Perhaps this could be attenuated by ablation and discontinuing his anticoagulant.  I reviewed the plan with his family.  He will continue on nocturnal oxygen.  We will recheck a CBC in about 2 weeks time.  We will also need to keep an eye on LV function.  He is 100% ventricularly paced and this may be contributing to cardiomyopathy  COVID 19 screen The patient denies symptoms of COVID 19 at this time.  The importance of social distancing was discussed today.  Follow-up:  62 m Next remote:  As Scheduled   Current medicines are reviewed at length with the patient today.   The patient does not have concerns regarding his medicines.  The following changes were made today:  none  Labs/ tests ordered today include:  No orders of the defined types were placed  in this encounter.   Future tests ( post COVID )     Patient Risk:  after full review of this patients clinical status, I feel that they are at moderate risk at this time.  Today, I have spent 11 minutes with the patient with telehealth technology discussing the above.  Signed, [name], MD  04/10/2019 3:18 PM     [facility] [address] [place] [address] [phone] (office) [phone] (fax)

## 2019-04-12 DIAGNOSIS — I5033 Acute on chronic diastolic (congestive) heart failure: Secondary | ICD-10-CM | POA: Diagnosis not present

## 2019-04-12 DIAGNOSIS — E1122 Type 2 diabetes mellitus with diabetic chronic kidney disease: Secondary | ICD-10-CM | POA: Diagnosis not present

## 2019-04-12 DIAGNOSIS — E1165 Type 2 diabetes mellitus with hyperglycemia: Secondary | ICD-10-CM | POA: Diagnosis not present

## 2019-04-12 DIAGNOSIS — N183 Chronic kidney disease, stage 3 (moderate): Secondary | ICD-10-CM | POA: Diagnosis not present

## 2019-04-12 DIAGNOSIS — I130 Hypertensive heart and chronic kidney disease with heart failure and stage 1 through stage 4 chronic kidney disease, or unspecified chronic kidney disease: Secondary | ICD-10-CM | POA: Diagnosis not present

## 2019-04-17 DIAGNOSIS — I130 Hypertensive heart and chronic kidney disease with heart failure and stage 1 through stage 4 chronic kidney disease, or unspecified chronic kidney disease: Secondary | ICD-10-CM | POA: Diagnosis not present

## 2019-04-17 DIAGNOSIS — E1165 Type 2 diabetes mellitus with hyperglycemia: Secondary | ICD-10-CM | POA: Diagnosis not present

## 2019-04-17 DIAGNOSIS — E1122 Type 2 diabetes mellitus with diabetic chronic kidney disease: Secondary | ICD-10-CM | POA: Diagnosis not present

## 2019-04-17 DIAGNOSIS — I5033 Acute on chronic diastolic (congestive) heart failure: Secondary | ICD-10-CM | POA: Diagnosis not present

## 2019-04-17 DIAGNOSIS — N183 Chronic kidney disease, stage 3 (moderate): Secondary | ICD-10-CM | POA: Diagnosis not present

## 2019-04-18 ENCOUNTER — Other Ambulatory Visit: Payer: Self-pay

## 2019-04-18 NOTE — Patient Outreach (Signed)
[facility]) Care Management  04/18/2019  Charles [name] 07-15-1932 [id number]  Medication Adherence call to Mr. Delta spoke with patient daughter she explain patient is still taking Lisinopril 40 mg patient has medication at this time they use a pill box patient will order when due.[name] is showing past due under [name].   [place] Management Direct Dial [phone]  Fax [phone] [email or web address]

## 2019-04-25 ENCOUNTER — Telehealth: Payer: Self-pay

## 2019-04-25 DIAGNOSIS — E1165 Type 2 diabetes mellitus with hyperglycemia: Secondary | ICD-10-CM | POA: Diagnosis not present

## 2019-04-25 DIAGNOSIS — N183 Chronic kidney disease, stage 3 (moderate): Secondary | ICD-10-CM | POA: Diagnosis not present

## 2019-04-25 DIAGNOSIS — I130 Hypertensive heart and chronic kidney disease with heart failure and stage 1 through stage 4 chronic kidney disease, or unspecified chronic kidney disease: Secondary | ICD-10-CM | POA: Diagnosis not present

## 2019-04-25 DIAGNOSIS — E1122 Type 2 diabetes mellitus with diabetic chronic kidney disease: Secondary | ICD-10-CM | POA: Diagnosis not present

## 2019-04-25 DIAGNOSIS — I5033 Acute on chronic diastolic (congestive) heart failure: Secondary | ICD-10-CM | POA: Diagnosis not present

## 2019-04-25 NOTE — Telephone Encounter (Signed)
The pt daughter [name] states the pt did not complain about not feeling well. She do not think the pt knows when he is having an episode. She is going to call the pt to ask him if he felt like he been having any symptoms. I told her I will have [place] to call her back.

## 2019-04-25 NOTE — Telephone Encounter (Signed)
LMOVM to assess if pt symptomatic. Alert for ongoing AF since 7/20. Available EGMs suggest AT. Pt had cardioversion on 03/20/19. Will route to Dr. Caryl Comes for review.

## 2019-04-25 NOTE — Telephone Encounter (Signed)
LMOVM, returning call to pt daughter.

## 2019-04-26 DIAGNOSIS — I5033 Acute on chronic diastolic (congestive) heart failure: Secondary | ICD-10-CM | POA: Diagnosis not present

## 2019-04-26 NOTE — Telephone Encounter (Signed)
Let me know please as Fib with rate control might not do anything at all thanks  sk

## 2019-04-30 NOTE — Telephone Encounter (Signed)
Spoke to pt daughter, pt is not symptomatic.

## 2019-04-30 NOTE — Telephone Encounter (Signed)
LMOVM requesting call back to [facility] clinic.

## 2019-05-02 DIAGNOSIS — I5033 Acute on chronic diastolic (congestive) heart failure: Secondary | ICD-10-CM | POA: Diagnosis not present

## 2019-05-02 DIAGNOSIS — I130 Hypertensive heart and chronic kidney disease with heart failure and stage 1 through stage 4 chronic kidney disease, or unspecified chronic kidney disease: Secondary | ICD-10-CM | POA: Diagnosis not present

## 2019-05-02 DIAGNOSIS — E1165 Type 2 diabetes mellitus with hyperglycemia: Secondary | ICD-10-CM | POA: Diagnosis not present

## 2019-05-02 DIAGNOSIS — N183 Chronic kidney disease, stage 3 (moderate): Secondary | ICD-10-CM | POA: Diagnosis not present

## 2019-05-02 DIAGNOSIS — E1122 Type 2 diabetes mellitus with diabetic chronic kidney disease: Secondary | ICD-10-CM | POA: Diagnosis not present

## 2019-05-09 DIAGNOSIS — E1165 Type 2 diabetes mellitus with hyperglycemia: Secondary | ICD-10-CM | POA: Diagnosis not present

## 2019-05-09 DIAGNOSIS — E1122 Type 2 diabetes mellitus with diabetic chronic kidney disease: Secondary | ICD-10-CM | POA: Diagnosis not present

## 2019-05-09 DIAGNOSIS — N183 Chronic kidney disease, stage 3 (moderate): Secondary | ICD-10-CM | POA: Diagnosis not present

## 2019-05-09 DIAGNOSIS — I5033 Acute on chronic diastolic (congestive) heart failure: Secondary | ICD-10-CM | POA: Diagnosis not present

## 2019-05-09 DIAGNOSIS — I130 Hypertensive heart and chronic kidney disease with heart failure and stage 1 through stage 4 chronic kidney disease, or unspecified chronic kidney disease: Secondary | ICD-10-CM | POA: Diagnosis not present

## 2019-05-09 NOTE — Telephone Encounter (Signed)
Will see then as scheduled  Thanks SK

## 2019-05-15 ENCOUNTER — Telehealth: Payer: Self-pay | Admitting: Cardiovascular Disease

## 2019-05-15 ENCOUNTER — Other Ambulatory Visit: Payer: Self-pay

## 2019-05-15 NOTE — Telephone Encounter (Signed)
New Message    Pt c/o swelling: STAT is pt has developed SOB within 24 hours  1) How much weight have you gained and in what time span? 6wks ago patient weighed 216 and now he's weighing 226.2 and they are using the same scale  2) If swelling, where is the swelling located? Feet, Legs, and ankles  3) Are you currently taking a fluid pill? yes  4) Are you currently SOB? No   5) Do you have a log of your daily weights (if so, list)?  Doesn't check daily  6) Have you gained 3 pounds in a day or 5 pounds in a week? Yes 5lbs in a week  7) Have you traveled recently? NO   Patient's daughter also gave him extra Torsemide over the weekend to help with issue.

## 2019-05-15 NOTE — Telephone Encounter (Signed)
Returned call to [facility] calling to report patient has been gaining weight.Stated last week he weighed 220 lbs.Today he weighed 226 lbs.He is not sob.Stated he has been eating salt [employer] biscuits.Spoke to patient's daughter n law [name] she stated patient is eating salt.Advised of a low salt diet.She stated he has swelling in both ankles and feet.Appointment scheduled with Jory Sims DNP 8/18 at 10:15 am.

## 2019-05-17 ENCOUNTER — Ambulatory Visit (INDEPENDENT_AMBULATORY_CARE_PROVIDER_SITE_OTHER): Payer: Medicare Other | Admitting: *Deleted

## 2019-05-17 ENCOUNTER — Other Ambulatory Visit: Payer: Self-pay | Admitting: Family Medicine

## 2019-05-17 DIAGNOSIS — I441 Atrioventricular block, second degree: Secondary | ICD-10-CM

## 2019-05-17 LAB — CUP PACEART REMOTE DEVICE CHECK
Battery Remaining Longevity: 105 mo
Battery Remaining Percentage: 95.5 %
Battery Voltage: 3.02 V
Brady Statistic AP VP Percent: 1 %
Brady Statistic AP VS Percent: 1 %
Brady Statistic AS VP Percent: 99 %
Brady Statistic AS VS Percent: 1 %
Brady Statistic RA Percent Paced: 1 %
Brady Statistic RV Percent Paced: 95 %
Date Time Interrogation Session: 20200813060014
Implantable Lead Implant Date: 20190719
Implantable Lead Implant Date: 20190719
Implantable Lead Location: 753859
Implantable Lead Location: 753860
Implantable Lead Model: 5076
Implantable Lead Model: 5076
Implantable Pulse Generator Implant Date: 20190719
Lead Channel Impedance Value: 440 Ohm
Lead Channel Impedance Value: 440 Ohm
Lead Channel Pacing Threshold Amplitude: 0.5 V
Lead Channel Pacing Threshold Pulse Width: 0.5 ms
Lead Channel Sensing Intrinsic Amplitude: 12 mV
Lead Channel Sensing Intrinsic Amplitude: 5 mV
Lead Channel Setting Pacing Amplitude: 1.5 V
Lead Channel Setting Pacing Amplitude: 2.5 V
Lead Channel Setting Pacing Pulse Width: 0.5 ms
Lead Channel Setting Sensing Sensitivity: 2 mV
Pulse Gen Model: 2272
Pulse Gen Serial Number: 9040268

## 2019-05-19 DIAGNOSIS — I5033 Acute on chronic diastolic (congestive) heart failure: Secondary | ICD-10-CM | POA: Diagnosis not present

## 2019-05-19 DIAGNOSIS — N183 Chronic kidney disease, stage 3 (moderate): Secondary | ICD-10-CM | POA: Diagnosis not present

## 2019-05-19 DIAGNOSIS — I130 Hypertensive heart and chronic kidney disease with heart failure and stage 1 through stage 4 chronic kidney disease, or unspecified chronic kidney disease: Secondary | ICD-10-CM | POA: Diagnosis not present

## 2019-05-19 DIAGNOSIS — E1165 Type 2 diabetes mellitus with hyperglycemia: Secondary | ICD-10-CM | POA: Diagnosis not present

## 2019-05-19 DIAGNOSIS — E1122 Type 2 diabetes mellitus with diabetic chronic kidney disease: Secondary | ICD-10-CM | POA: Diagnosis not present

## 2019-05-21 NOTE — Progress Notes (Signed)
Cardiology Office Note   Date:  05/22/2019   ID:  [name], DOB 06-16-1932, MRN [id number]  PCP:  [name], MD  Cardiologist:  Dr. [name]  Electrophysiologist: Dr. Caryl Comes   Chief Complaint  Patient presents with  . Shortness of Breath  . Edema    ankles    History of Present Illness: Charles [name] is a 85 y.o. male who presents for ongoing assessment and management of chronic diastolic CHF, hx of CAD s/p CABG X 4 in 2004, (LIMA to LAD, SVG to RCA, OM branch to Ramus branch; RAS with 70% bilateral stenosis; HTN, HL, carotid artery disease, atrial flutter with TEE DCCV in the past.   Most recent cardiac catheterization in January 2019 revealed moderate pulmonary HTN,  with patent grafts.  The patient did undergo TAVR on 12/05/2018, with [employer] bioprosthetic valve.  He is also s/p PPM is the setting of AV block following TAVR. He remains on Eliquis for atrial flutter anticoagulation for prophylaxis.  He is O2 dependent due to desaturation episodes during PT. Dry weight 216 lbs.   He was seen by Dr. Caryl Comes on 04/10/2019 on EP follow up. He was recommended for intermittent cardioversion given the infrequency of his atrial flutter. With chronic anemia on anticoagulant. Ablation was discussed. No plans were made at that office visit to proceed. A repeat CBC was ordered.   He comes today having gained 10 lbs with fluid retention and edema of his lower legs. He has not been adhering to a low sodium diet. He eats microwave foods because it is convenient for hm.  He states that his abdomen is more swollen, belt is on last loop.   Past Medical History:  Diagnosis Date  . Abdominal aortic atherosclerosis ([place])    by xray  . Arthritis    in lower back (06/24/2015)  . Atrial flutter ([place])    notes 06/24/2015  . AV block, [place] 1 09/06/2017   Archie Endo 09/06/2017  . 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 CAD (coronary artery disease) 2004   a.  s/p CABG in 2004 with LIMA-LAD, SVG-RCA, SVG-OM, and SVG-RI  . Carotid stenosis 1999   s/p L CEA  . Chronic diastolic CHF (congestive heart failure) ([place])   . Colon polyp 2005   (Dr. Tiffany Kocher)  . Compression fracture of L1 lumbar vertebra (HCC) remote  . COPD (chronic obstructive pulmonary disease) ([place]) 03/2011   by xray  . Depression    hx  . GERD (gastroesophageal reflux disease) 2003   h/o duodenal ulcer per EGD as well as esophagitis  . History of colon polyps 2003, 2005   adenomatous Vira Agar)  . History of tobacco abuse   . HLD (hyperlipidemia)   . HTN (hypertension)   . Presence of permanent cardiac pacemaker   . Psoriasis   . Renal artery stenosis ([place]) 2004   70% bilateral, followed by cards  . 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 Severe aortic stenosis   . T2DM (type 2 diabetes mellitus) ([place]) 1995  . Urge incontinence of urine     Past Surgical History:  Procedure Laterality Date  . CARDIAC CATHETERIZATION  03/06/2003   No intervention - recommend CABG  . CARDIOVASCULAR STRESS TEST  11/2010   normal perfusion, no evidence of ischemia, EF 62% post exercise  . CARDIOVASCULAR  STRESS TEST  11/28/2012   Mild diaphragmatic attenuation; cannot exclude a focal region of nontransmural inferior scar  . CARDIOVERSION N/A 06/25/2015   Procedure: CARDIOVERSION;  Surgeon: Pixie Casino, MD;  Location: [facility] ENDOSCOPY;  Service: Cardiovascular;  Laterality: N/A;  . CARDIOVERSION N/A 11/02/2016   Procedure: CARDIOVERSION;  Surgeon: [name], MD;  Location: [place];  Service: Cardiovascular;  Laterality: N/A;  . CARDIOVERSION N/A 03/20/2019   Procedure: CARDIOVERSION;  Surgeon: Jerline Pain, MD;  Location: [place];  Service: Cardiovascular;  Laterality: N/A;  . CAROTID ENDARTERECTOMY Left 1999   ([place])  . CATARACT EXTRACTION W/ INTRAOCULAR LENS   IMPLANT, BILATERAL Bilateral 01/2013   [place]  . COLONOSCOPY  2003   colon polyp x3 - adenomatous Tiffany Kocher)  . COLONOSCOPY  10/08/2012   2 TA, diverticulosis, int hem, no rpt rec Tiffany Kocher)  . CORONARY ANGIOPLASTY    . CORONARY ARTERY BYPASS GRAFT  03/07/2003   4v CABG ([place]) with LIMA to LAD, vein graft to RCA, 1st obtuse marginal, and ramus intermedius  . ESOPHAGOGASTRODUODENOSCOPY  10/08/2012   nl esophagus, duodenitis and erosive gastropathy, path - gastropathy no Hpylori, no rpt rec  . LUMBAR EPIDURAL INJECTION  03/2017   L5/S1 ([place])  . PACEMAKER IMPLANT N/A 04/21/2018   Procedure: PACEMAKER IMPLANT -- Dual Chamber;  Surgeon: Deboraha Sprang, MD;  Location: [place] CV LAB;  Service: Cardiovascular;  Laterality: N/A;  . PERIPHERAL VASCULAR CATHETERIZATION N/A 02/02/2016   Procedure: Renal Angiography;  Surgeon: [name], MD;  Location: [place] CV LAB;  Service: Cardiovascular;  Laterality: N/A;  . PERIPHERAL VASCULAR CATHETERIZATION N/A 02/02/2016   Procedure: Abdominal Aortogram;  Surgeon: [name], MD;  Location: [place] CV LAB;  Service: Cardiovascular;  Laterality: N/A;  . RENAL DOPPLER  11/29/2011   Celiac&SMA-demonstrated vessel narrowing suggestive of a greater than 50% diameter reduction. Bilateral renal arteries-demonstrated vessel narrowing of 60-99% diameter reduction. Rt Kidney-mid pole lateral simple cyst noted measuring 1.29x0.76x1.11cm and exophytic cyst outside lower pole measuring 1.23x0.96x1.31. Lft Kidney-lateral mid to lower pole simple cyst measuering-1.24x9.83x1.24  . RIGHT/LEFT HEART CATH AND CORONARY/GRAFT ANGIOGRAPHY N/A 10/20/2017   Procedure: RIGHT/LEFT HEART CATH AND CORONARY/GRAFT ANGIOGRAPHY;  Surgeon: Burnell Blanks, MD;  Location: [place] CV LAB;  Service: Cardiovascular;  Laterality: N/A;  . TEE WITHOUT CARDIOVERSION N/A 06/25/2015   Procedure: TRANSESOPHAGEAL ECHOCARDIOGRAM (TEE);  Surgeon: Pixie Casino, MD;  Location[facility]  ENDOSCOPY;  Service: Cardiovascular;  Laterality: N/A;  . TEE WITHOUT CARDIOVERSION N/A 11/02/2016   Procedure: TRANSESOPHAGEAL ECHOCARDIOGRAM (TEE);  Surgeon: [name], MD;  Location: [place];  Service: Cardiovascular;  Laterality: N/A;  . TEE WITHOUT CARDIOVERSION N/A 12/05/2018   Procedure: TRANSESOPHAGEAL ECHOCARDIOGRAM (TEE);  Surgeon: Sherren Mocha, MD;  Location: [place];  Service: Open Heart Surgery;  Laterality: N/A;  . TONSILLECTOMY    . TRANSCATHETER AORTIC VALVE REPLACEMENT, TRANSFEMORAL N/A 12/05/2018   Procedure: TRANSCATHETER AORTIC VALVE REPLACEMENT, TRANSFEMORAL;  Surgeon: Sherren Mocha, MD;  Location: [place];  Service: Open Heart Surgery;  Laterality: N/A;  . UPPER GASTROINTESTINAL ENDOSCOPY  2003   reflux esophagitis, erosive gastropathy, duodenal ulcer     Current Outpatient Medications  Medication Sig Dispense Refill  . acetaminophen (TYLENOL) 500 MG tablet Take 1 tablet (500 mg total) by mouth 3 (three) times daily.    [name] atorvastatin (LIPITOR) 20 MG tablet Take 1 tablet (20 mg total) by mouth daily. 90 tablet 3  . BD INSULIN SYRINGE U/F 30G X 1/2" 0.5 ML MISC  AS DIRECTED. 100 each 0  . citalopram (CELEXA) 20 MG tablet Take 1 tablet (20 mg total) by mouth daily. 90 tablet 3  . ELIQUIS 5 MG TABS tablet Take 1 tablet (5 mg total) by mouth 2 (two) times daily. 60 tablet 3  . finasteride (PROSCAR) 5 MG tablet Take 1 tablet (5 mg total) by mouth daily. 30 tablet 5  . glucose blood (ACCU-CHEK AVIVA PLUS) test strip Use as instructed to check blood sugar three times a day. Dx code E11.8 & E11.65 300 strip 1  . guaiFENesin (MUCINEX) 600 MG 12 hr tablet Take 600 mg by mouth 2 (two) times daily as needed for cough or to loosen phlegm.    [name] LANTUS 100 UNIT/ML injection Inject 0.2 mLs (20 Units total) into the skin at bedtime. 10 mL 2  . lisinopril (PRINIVIL,ZESTRIL) 40 MG tablet Take 1 tablet (40 mg total) by mouth daily. 30 tablet 11  . metFORMIN (GLUCOPHAGE) 1000 MG  tablet Take 1 tablet (1,000 mg total) by mouth 2 (two) times daily with a meal. 180 tablet 0  . nitroGLYCERIN (NITROSTAT) 0.4 MG SL tablet Place 1 tablet (0.4 mg total) under the tongue every 5 (five) minutes as needed for chest pain. 90 tablet 3  . pantoprazole (PROTONIX) 20 MG tablet Take 1 tablet (20 mg total) by mouth daily. 90 tablet 3  . potassium chloride SA (K-DUR) 20 MEQ tablet Take 1 tablet (20 mEq total) every Monday, Wednesday, Friday, Saturday, and Sunday. 30 tablet 11  . tamsulosin (FLOMAX) 0.4 MG CAPS capsule Take 1 capsule (0.4 mg total) by mouth daily after supper. 30 capsule 5  . torsemide (DEMADEX) 20 MG tablet Take 3 tablets (60 mg total) by mouth daily. (Patient taking differently: Take 60 mg by mouth daily. Takes 3 tablets in the morning and 2 tablets at night.) 90 tablet 1  . traZODone (DESYREL) 50 MG tablet Take 1 tablet (50 mg total) by mouth at bedtime. 90 tablet 3   No current facility-administered medications for this visit.     Allergies:   Gabapentin, Metoprolol, Spironolactone, Amlodipine, Other, Rosiglitazone maleate, and Tricor [fenofibrate]    Social History:  The patient  reports that he quit smoking about 5 years ago. His smoking use included cigarettes. He has a 18.00 pack-year smoking history. He has never used smokeless tobacco. He reports that he does not drink alcohol or use drugs.   Family History:  The patient's family history includes Cancer in his brother, brother, maternal grandmother, and mother; Diabetes in his sister; Heart attack in his maternal grandfather, paternal grandfather, and paternal grandmother; Heart disease in his father and sister; Hypertension in his father and sister; Lung disease in his brother; Stroke in his sister.    ROS: All other systems are reviewed and negative. Unless otherwise mentioned in H&P    PHYSICAL EXAM: VS:  BP 117/64   Pulse 70   Temp (!) 97.3 F (36.3 C)   Ht 6\' 1"  (1.854 m)   Wt 228 lb (103.4 kg)   SpO2  91%   BMI 30.08 kg/m  , BMI Body mass index is 30.08 kg/m. GEN: Well nourished, well developed, in no acute distress HEENT: normal Neck: no JVD, carotid bruits, or masses Cardiac: RRR; no murmurs, rubs, or gallops, 2+ pretibial  edema bilateral.  Respiratory:  Clear to auscultation bilaterally, normal work of breathing GI: soft, nontender, distended, + BS MS: no deformity or atrophy. Overall deconditioning.  Skin: warm and dry, no rash  Neuro:  Strength and sensation are intact Psych: euthymic mood, full affect   EKG: Not completed today.   Recent Labs: 01/26/2019: NT-Pro BNP 1,162 03/19/2019: ALT 20; B Natriuretic Peptide 419.9; Hemoglobin 10.7; Platelets 142; TSH 1.707 03/20/2019: Magnesium 1.7 03/22/2019: BUN 25; Creatinine, Ser 1.48; Potassium 3.9; Sodium 138    Lipid Panel    Component Value Date/Time   CHOL 137 02/23/2019 1104   TRIG 283.0 (H) 02/23/2019 1104   TRIG 133 09/10/2011   HDL 30.40 (L) 02/23/2019 1104   CHOLHDL 5 02/23/2019 1104   VLDL 56.6 (H) 02/23/2019 1104   LDLCALC 73 06/08/2016 1423   LDLDIRECT 75.0 02/23/2019 1104      Wt Readings from Last 3 Encounters:  05/22/19 228 lb (103.4 kg)  04/10/19 211 lb (95.7 kg)  03/29/19 219 lb 12.8 oz (99.7 kg)      Other studies Reviewed:  Echocardiogram 12/26/2018 . The left ventricle has low normal systolic function, with an ejection fraction of 50-55%. The cavity size was normal. There is moderately increased left ventricular wall thickness. Left ventricular diastolic Doppler parameters are consistent with  impaired relaxation. Elevated left atrial and left ventricular end-diastolic pressures The E/e' is >20. There is abnormal septal motion consistent with RV pacemaker. 2. Moderate hypokinesis of the left ventricular, basal-mid septal wall. 3. The mitral valve is degenerative. Mild thickening of the mitral valve leaflet. Moderate calcification of the mitral valve leaflet. There is moderate mitral annular  calcification present. Mild mitral valve stenosis. 4. The tricuspid valve is grossly normal. 5. A 26 an Edwards Edwards Sapien bioprosthetic aortic valve (TAVR) valve is present in the aortic position. Procedure Date: 12/05/2018 Echo findings are consistent with mild perivalvular leak of the aortic prosthesis. 6. Moderate calcification of the aortic valve. mild stenosis of the aortic valve. 7. Left atrial size was moderately dilated. 8. Right atrial size was moderately dilated. 9. The right ventricle has normal systolic function. The cavity was normal. There is no increase in right ventricular wall thickness. 10. When compared to the prior study: 12/06/2018: LVEF 55-60%.   ASSESSMENT AND PLAN:  1. Acute on chronic diastolic CHF: He has gained 10 lbs and has not been compliant with low sodium diet. He has gone up on his torsemide dose to 80 mg in the am and 20 mg in the afternoon, with no increase in diureses.   He will be given metolazone 2.5 mg X 1 and continue current dose of torsemide. If he does not lose weight by tomorrow am of at least 4-5 lbs, he will take an additional metolazone 2.5 again X 1.  If no diureses, will likely need to be hospitalized for Iv diureses. This has been explained to him.   He is to adhere more closely to a low sodium diet, copy of the "[place] 6" list of high sodium foods is given to him. He will have a baseline BMET, CBC as requested by Dr.Klein and a BNP today. He will have follow up BMET in 3 days to check kidney function and sodium function. Last creatinine 1.4 in June 2020.   2. O2 Dependence:Continues O2 via [place].   3. Atrial flutter: Followed by Dr. Caryl Comes. He has a follow up appointment in Sept 2020. CBC is ordered.He will continue on Eliquis 5 mg BID.   4. Hyperlipidemia: Continue Atorvastatin as directed.    Current medicines are reviewed at length with the patient today.    Labs/ tests ordered today include: BMET, BNP,CBC.  Follow up  BMET in 3 days.   Phill Myron. West Pugh, ANP, [facility]   05/22/2019 10:46 AM    [facility] [place] [address] Office [phone] Fax [phone]

## 2019-05-22 ENCOUNTER — Ambulatory Visit (INDEPENDENT_AMBULATORY_CARE_PROVIDER_SITE_OTHER): Payer: Medicare Other | Admitting: Adult Health

## 2019-05-22 ENCOUNTER — Encounter: Payer: Self-pay | Admitting: Adult Health

## 2019-05-22 ENCOUNTER — Other Ambulatory Visit: Payer: Self-pay

## 2019-05-22 VITALS — BP 117/64 | HR 70 | Temp 97.3°F | Ht 73.0 in | Wt 228.0 lb

## 2019-05-22 DIAGNOSIS — Z79899 Other long term (current) drug therapy: Secondary | ICD-10-CM

## 2019-05-22 DIAGNOSIS — I483 Typical atrial flutter: Secondary | ICD-10-CM

## 2019-05-22 DIAGNOSIS — R0602 Shortness of breath: Secondary | ICD-10-CM

## 2019-05-22 DIAGNOSIS — I5032 Chronic diastolic (congestive) heart failure: Secondary | ICD-10-CM

## 2019-05-22 MED ORDER — METOLAZONE 2.5 MG PO TABS: 2.5000 mg | ORAL_TABLET | Freq: Every day | ORAL | 0 refills | Status: DC

## 2019-05-22 NOTE — Patient Instructions (Signed)
Medication Instructions:  START- Metolazone 2.5 mg take 1 tablet today 30 mins before taking Torsemide and if weight does not goes down you will need to take another dose of Metolazone 2.5 mg tomorrow, If weight goes down do not take Metolazone, please continue taking your torsemide.  If you need a refill on your cardiac medications before your next appointment, please call your pharmacy.  Labwork: BMP, CBC, BNP today and BMP on Friday HERE IN OUR OFFICE AT [facility]    You will NOT need to fast   Take the provided lab slips with you to the lab for your blood draw.   When you have your labs (blood work) drawn today and your tests are completely normal, you will receive your results only by MyChart Message (if you have MyChart) -OR-  A paper copy in the mail.  If you have any lab test that is abnormal or we need to change your treatment, we will call you to review these results.  Testing/Procedures: None Ordered  Please give our office a call on Thursday about your weight.  Follow-Up: . Your physician recommends that you schedule a follow-up appointment in: 1 Week   At [facility], you and your health needs are our priority.  As part of our continuing mission to provide you with exceptional heart care, we have created designated Provider Care Teams.  These Care Teams include your primary Cardiologist (physician) and Advanced Practice Providers (APPs -  Physician Assistants and Nurse Practitioners) who all work together to provide you with the care you need, when you need it.  Thank you for choosing [facility] at [facility]!!

## 2019-05-23 ENCOUNTER — Telehealth: Payer: Self-pay | Admitting: Adult Health

## 2019-05-23 LAB — CBC
Hematocrit: 31.7 % — ABNORMAL LOW (ref 37.5–51.0)
Hemoglobin: 10.4 g/dL — ABNORMAL LOW (ref 13.0–17.7)
MCH: 29.3 pg (ref 26.6–33.0)
MCHC: 32.8 g/dL (ref 31.5–35.7)
MCV: 89 fL (ref 79–97)
Platelets: 140 10*3/uL — ABNORMAL LOW (ref 150–450)
RBC: 3.55 x10E6/uL — ABNORMAL LOW (ref 4.14–5.80)
RDW: 14.3 % (ref 11.6–15.4)
WBC: 5.5 10*3/uL (ref 3.4–10.8)

## 2019-05-23 LAB — BASIC METABOLIC PANEL
BUN/Creatinine Ratio: 29 — ABNORMAL HIGH (ref 10–24)
BUN: 50 mg/dL — ABNORMAL HIGH (ref 8–27)
CO2: 22 mmol/L (ref 20–29)
Calcium: 9.6 mg/dL (ref 8.6–10.2)
Chloride: 101 mmol/L (ref 96–106)
Creatinine, Ser: 1.75 mg/dL — ABNORMAL HIGH (ref 0.76–1.27)
GFR calc Af Amer: 40 mL/min/{1.73_m2} — ABNORMAL LOW (ref >59)
GFR calc non Af Amer: 35 mL/min/{1.73_m2} — ABNORMAL LOW (ref >59)
Glucose: 141 mg/dL — ABNORMAL HIGH (ref 65–99)
Potassium: 5 mmol/L (ref 3.5–5.2)
Sodium: 140 mmol/L (ref 134–144)

## 2019-05-23 LAB — BRAIN NATRIURETIC PEPTIDE: BNP: 202.4 pg/mL — ABNORMAL HIGH (ref 0.0–100.0)

## 2019-05-23 NOTE — Telephone Encounter (Signed)
New Message:  Claiborne Rigg, daughter of the patient returned a call from [place] regarding lab results. Please call back.

## 2019-05-23 NOTE — Telephone Encounter (Signed)
He should go to the hospital for his symptoms as he did not have a response to the metolazone. He is not to take additional doses. He may need IV lasix as he is not absorbing the medications.

## 2019-05-23 NOTE — Telephone Encounter (Signed)
The daughter in law has been made aware of the results, per dpr. She stated that the patient took the Metolazone yesterday and today. His weight this morning was 226 pounds. She stated that he has not lost any weight. The patient stated that his breathing is stable with his oxygen on.   Notes recorded by Lendon Colonel, NP on 05/22/2019 at 5:57 PM EDT  He remains anemic.Creatinine is elevated at 1.75. GFR is 35. He will need to only take one dose of the metolazone today instead of for two days. I worry that his creatinine will rise substantially as he is already taking the extra torsemide doses.

## 2019-05-24 ENCOUNTER — Other Ambulatory Visit: Payer: Self-pay

## 2019-05-24 ENCOUNTER — Encounter (HOSPITAL_COMMUNITY): Payer: Self-pay | Admitting: Emergency Medicine

## 2019-05-24 ENCOUNTER — Emergency Department (HOSPITAL_COMMUNITY): Payer: Medicare Other

## 2019-05-24 ENCOUNTER — Observation Stay (HOSPITAL_COMMUNITY)
Admission: EM | Admit: 2019-05-24 | Discharge: 2019-05-25 | Disposition: A | Discharge status: Home / Self Care | Payer: Medicare Other | Attending: Internal Medicine | Admitting: Internal Medicine

## 2019-05-24 DIAGNOSIS — R600 Localized edema: Secondary | ICD-10-CM | POA: Insufficient documentation

## 2019-05-24 DIAGNOSIS — R0602 Shortness of breath: Secondary | ICD-10-CM | POA: Diagnosis present

## 2019-05-24 DIAGNOSIS — N183 Chronic kidney disease, stage 3 (moderate): Secondary | ICD-10-CM | POA: Insufficient documentation

## 2019-05-24 DIAGNOSIS — R778 Other specified abnormalities of plasma proteins: Secondary | ICD-10-CM

## 2019-05-24 DIAGNOSIS — J189 Pneumonia, unspecified organism: Secondary | ICD-10-CM | POA: Diagnosis not present

## 2019-05-24 DIAGNOSIS — Z952 Presence of prosthetic heart valve: Secondary | ICD-10-CM | POA: Diagnosis not present

## 2019-05-24 DIAGNOSIS — E119 Type 2 diabetes mellitus without complications: Secondary | ICD-10-CM | POA: Diagnosis not present

## 2019-05-24 DIAGNOSIS — N179 Acute kidney failure, unspecified: Secondary | ICD-10-CM | POA: Diagnosis present

## 2019-05-24 DIAGNOSIS — I130 Hypertensive heart and chronic kidney disease with heart failure and stage 1 through stage 4 chronic kidney disease, or unspecified chronic kidney disease: Secondary | ICD-10-CM | POA: Diagnosis not present

## 2019-05-24 DIAGNOSIS — J984 Other disorders of lung: Secondary | ICD-10-CM | POA: Diagnosis not present

## 2019-05-24 DIAGNOSIS — I484 Atypical atrial flutter: Secondary | ICD-10-CM | POA: Diagnosis not present

## 2019-05-24 DIAGNOSIS — Z20828 Contact with and (suspected) exposure to other viral communicable diseases: Secondary | ICD-10-CM | POA: Insufficient documentation

## 2019-05-24 DIAGNOSIS — N182 Chronic kidney disease, stage 2 (mild): Secondary | ICD-10-CM | POA: Diagnosis present

## 2019-05-24 DIAGNOSIS — E1122 Type 2 diabetes mellitus with diabetic chronic kidney disease: Secondary | ICD-10-CM | POA: Diagnosis present

## 2019-05-24 DIAGNOSIS — I5032 Chronic diastolic (congestive) heart failure: Secondary | ICD-10-CM | POA: Diagnosis present

## 2019-05-24 DIAGNOSIS — IMO0002 Reserved for concepts with insufficient information to code with codable children: Secondary | ICD-10-CM | POA: Diagnosis present

## 2019-05-24 DIAGNOSIS — E1165 Type 2 diabetes mellitus with hyperglycemia: Secondary | ICD-10-CM | POA: Diagnosis present

## 2019-05-24 DIAGNOSIS — R7989 Other specified abnormal findings of blood chemistry: Secondary | ICD-10-CM | POA: Diagnosis not present

## 2019-05-24 DIAGNOSIS — I4892 Unspecified atrial flutter: Secondary | ICD-10-CM | POA: Diagnosis present

## 2019-05-24 DIAGNOSIS — I5033 Acute on chronic diastolic (congestive) heart failure: Principal | ICD-10-CM | POA: Insufficient documentation

## 2019-05-24 DIAGNOSIS — Z950 Presence of cardiac pacemaker: Secondary | ICD-10-CM | POA: Diagnosis present

## 2019-05-24 DIAGNOSIS — R609 Edema, unspecified: Secondary | ICD-10-CM

## 2019-05-24 LAB — BASIC METABOLIC PANEL
Anion gap: 14 (ref 5–15)
BUN: 57 mg/dL — ABNORMAL HIGH (ref 8–23)
CO2: 24 mmol/L (ref 22–32)
Calcium: 9 mg/dL (ref 8.9–10.3)
Chloride: 95 mmol/L — ABNORMAL LOW (ref 98–111)
Creatinine, Ser: 2.15 mg/dL — ABNORMAL HIGH (ref 0.61–1.24)
GFR calc Af Amer: 32 mL/min — ABNORMAL LOW (ref >60)
GFR calc non Af Amer: 27 mL/min — ABNORMAL LOW (ref >60)
Glucose, Bld: 220 mg/dL — ABNORMAL HIGH (ref 70–99)
Potassium: 4.8 mmol/L (ref 3.5–5.1)
Sodium: 133 mmol/L — ABNORMAL LOW (ref 135–145)

## 2019-05-24 LAB — CBC
HCT: 33 % — ABNORMAL LOW (ref 39.0–52.0)
Hemoglobin: 10.5 g/dL — ABNORMAL LOW (ref 13.0–17.0)
MCH: 29.2 pg (ref 26.0–34.0)
MCHC: 31.8 g/dL (ref 30.0–36.0)
MCV: 91.9 fL (ref 80.0–100.0)
Platelets: 135 10*3/uL — ABNORMAL LOW (ref 150–400)
RBC: 3.59 MIL/uL — ABNORMAL LOW (ref 4.22–5.81)
RDW: 14.6 % (ref 11.5–15.5)
WBC: 6.1 10*3/uL (ref 4.0–10.5)
nRBC: 0 % (ref 0.0–0.2)

## 2019-05-24 MED ORDER — SODIUM CHLORIDE 0.9% FLUSH
3.0000 mL | Freq: Once | INTRAVENOUS | Status: AC
  Administered 2019-05-25: 3 mL via INTRAVENOUS

## 2019-05-24 NOTE — ED Triage Notes (Signed)
Pt has been retaining fluid for 2 weeks. PT was seen on Tuesday for fluid overload, but has not improved. PT's abd and lower legs swelling. Pt wears home O2. (unsure of how many liters). PT increasingly SOB. Denies CP

## 2019-05-24 NOTE — ED Notes (Signed)
O2 WNL, pt says that he does use 2L continuous, request the same, 2L [place] applied

## 2019-05-24 NOTE — Telephone Encounter (Signed)
Call and spoke with pt daughter tara, advised her of Jory Sims, DNP recommendation, She said will take him now to [facility] ED, pt also stated pt weight this morning was 225lbs.

## 2019-05-25 ENCOUNTER — Encounter (HOSPITAL_COMMUNITY): Payer: Self-pay | Admitting: Emergency Medicine

## 2019-05-25 DIAGNOSIS — J189 Pneumonia, unspecified organism: Secondary | ICD-10-CM | POA: Diagnosis present

## 2019-05-25 DIAGNOSIS — E1165 Type 2 diabetes mellitus with hyperglycemia: Secondary | ICD-10-CM

## 2019-05-25 DIAGNOSIS — E118 Type 2 diabetes mellitus with unspecified complications: Secondary | ICD-10-CM

## 2019-05-25 DIAGNOSIS — I509 Heart failure, unspecified: Secondary | ICD-10-CM | POA: Diagnosis not present

## 2019-05-25 DIAGNOSIS — N182 Chronic kidney disease, stage 2 (mild): Secondary | ICD-10-CM

## 2019-05-25 DIAGNOSIS — I5033 Acute on chronic diastolic (congestive) heart failure: Secondary | ICD-10-CM

## 2019-05-25 DIAGNOSIS — N179 Acute kidney failure, unspecified: Secondary | ICD-10-CM | POA: Diagnosis not present

## 2019-05-25 DIAGNOSIS — R0602 Shortness of breath: Secondary | ICD-10-CM

## 2019-05-25 DIAGNOSIS — I4892 Unspecified atrial flutter: Secondary | ICD-10-CM

## 2019-05-25 DIAGNOSIS — J181 Lobar pneumonia, unspecified organism: Secondary | ICD-10-CM | POA: Diagnosis not present

## 2019-05-25 LAB — CBC
HCT: 30.6 % — ABNORMAL LOW (ref 39.0–52.0)
Hemoglobin: 9.9 g/dL — ABNORMAL LOW (ref 13.0–17.0)
MCH: 29.2 pg (ref 26.0–34.0)
MCHC: 32.4 g/dL (ref 30.0–36.0)
MCV: 90.3 fL (ref 80.0–100.0)
Platelets: 128 10*3/uL — ABNORMAL LOW (ref 150–400)
RBC: 3.39 MIL/uL — ABNORMAL LOW (ref 4.22–5.81)
RDW: 14.6 % (ref 11.5–15.5)
WBC: 5.6 10*3/uL (ref 4.0–10.5)
nRBC: 0 % (ref 0.0–0.2)

## 2019-05-25 LAB — BASIC METABOLIC PANEL
Anion gap: 9 (ref 5–15)
BUN: 61 mg/dL — ABNORMAL HIGH (ref 8–23)
CO2: 29 mmol/L (ref 22–32)
Calcium: 8.9 mg/dL (ref 8.9–10.3)
Chloride: 98 mmol/L (ref 98–111)
Creatinine, Ser: 2.01 mg/dL — ABNORMAL HIGH (ref 0.61–1.24)
GFR calc Af Amer: 34 mL/min — ABNORMAL LOW (ref >60)
GFR calc non Af Amer: 30 mL/min — ABNORMAL LOW (ref >60)
Glucose, Bld: 145 mg/dL — ABNORMAL HIGH (ref 70–99)
Potassium: 4.4 mmol/L (ref 3.5–5.1)
Sodium: 136 mmol/L (ref 135–145)

## 2019-05-25 LAB — GLUCOSE, CAPILLARY
Glucose-Capillary: 138 mg/dL — ABNORMAL HIGH (ref 70–99)
Glucose-Capillary: 137 mg/dL — ABNORMAL HIGH (ref 70–99)
Glucose-Capillary: 214 mg/dL — ABNORMAL HIGH (ref 70–99)
Glucose-Capillary: 186 mg/dL — ABNORMAL HIGH (ref 70–99)

## 2019-05-25 LAB — PROCALCITONIN: Procalcitonin: 0.11 ng/mL

## 2019-05-25 LAB — BRAIN NATRIURETIC PEPTIDE: B Natriuretic Peptide: 178.5 pg/mL — ABNORMAL HIGH (ref 0.0–100.0)

## 2019-05-25 LAB — TROPONIN I (HIGH SENSITIVITY)
Troponin I (High Sensitivity): 107 ng/L (ref <18)
Troponin I (High Sensitivity): 107 ng/L (ref <18)

## 2019-05-25 LAB — SARS CORONAVIRUS 2 BY RT PCR (HOSPITAL ORDER, PERFORMED IN ~~LOC~~ HOSPITAL LAB): SARS Coronavirus 2: NEGATIVE

## 2019-05-25 LAB — HIV ANTIBODY (ROUTINE TESTING W REFLEX): HIV Screen 4th Generation wRfx: NONREACTIVE

## 2019-05-25 MED ORDER — TORSEMIDE 20 MG PO TABS: 40.0000 mg | ORAL_TABLET | Freq: Every day | ORAL | Status: DC

## 2019-05-25 MED ORDER — ACETAMINOPHEN 500 MG PO TABS: 500.0000 mg | ORAL_TABLET | Freq: Three times a day (TID) | ORAL | Status: DC | PRN

## 2019-05-25 MED ORDER — ATORVASTATIN CALCIUM 10 MG PO TABS
20.0000 mg | ORAL_TABLET | Freq: Every day | ORAL | Status: DC
  Administered 2019-05-25: 20 mg via ORAL
  Filled 2019-05-25: qty 2

## 2019-05-25 MED ORDER — DOXYCYCLINE HYCLATE 100 MG PO TABS: 100.0000 mg | ORAL_TABLET | Freq: Two times a day (BID) | ORAL | Status: DC

## 2019-05-25 MED ORDER — FINASTERIDE 5 MG PO TABS
5.0000 mg | ORAL_TABLET | Freq: Every day | ORAL | Status: DC
  Administered 2019-05-25: 5 mg via ORAL
  Filled 2019-05-25: qty 1

## 2019-05-25 MED ORDER — FUROSEMIDE 10 MG/ML IJ SOLN
40.0000 mg | Freq: Once | INTRAMUSCULAR | Status: AC
  Administered 2019-05-25: 40 mg via INTRAVENOUS
  Filled 2019-05-25: qty 4

## 2019-05-25 MED ORDER — PANTOPRAZOLE SODIUM 20 MG PO TBEC
20.0000 mg | DELAYED_RELEASE_TABLET | Freq: Every day | ORAL | Status: DC
  Administered 2019-05-25: 20 mg via ORAL
  Filled 2019-05-25: qty 1

## 2019-05-25 MED ORDER — CITALOPRAM HYDROBROMIDE 10 MG PO TABS
20.0000 mg | ORAL_TABLET | Freq: Every day | ORAL | Status: DC
  Administered 2019-05-25: 20 mg via ORAL
  Filled 2019-05-25: qty 2

## 2019-05-25 MED ORDER — TRAZODONE HCL 50 MG PO TABS: 50.0000 mg | ORAL_TABLET | Freq: Every day | ORAL | Status: DC

## 2019-05-25 MED ORDER — SODIUM CHLORIDE 0.9 % IV SOLN
2.0000 g | Freq: Once | INTRAVENOUS | Status: AC
  Administered 2019-05-25: 2 g via INTRAVENOUS
  Filled 2019-05-25: qty 2

## 2019-05-25 MED ORDER — AZITHROMYCIN 250 MG PO TABS
500.0000 mg | ORAL_TABLET | Freq: Every day | ORAL | Status: DC
  Administered 2019-05-25: 500 mg via ORAL
  Filled 2019-05-25: qty 2

## 2019-05-25 MED ORDER — VANCOMYCIN HCL 10 G IV SOLR
2000.0000 mg | Freq: Once | INTRAVENOUS | Status: AC
  Administered 2019-05-25: 2000 mg via INTRAVENOUS
  Filled 2019-05-25: qty 2000

## 2019-05-25 MED ORDER — METOLAZONE 2.5 MG PO TABS: 2.5000 mg | ORAL_TABLET | Freq: Every day | ORAL | Status: DC

## 2019-05-25 MED ORDER — SODIUM CHLORIDE 0.9 % IV SOLN
INTRAVENOUS | Status: DC | PRN
  Administered 2019-05-25: 250 mL via INTRAVENOUS

## 2019-05-25 MED ORDER — APIXABAN 5 MG PO TABS
5.0000 mg | ORAL_TABLET | Freq: Two times a day (BID) | ORAL | Status: DC
  Administered 2019-05-25 (×2): 5 mg via ORAL
  Filled 2019-05-25 (×2): qty 1

## 2019-05-25 MED ORDER — POTASSIUM CHLORIDE CRYS ER 20 MEQ PO TBCR
20.0000 meq | EXTENDED_RELEASE_TABLET | ORAL | Status: DC
  Administered 2019-05-25: 20 meq via ORAL
  Filled 2019-05-25: qty 1

## 2019-05-25 MED ORDER — SODIUM CHLORIDE 0.9 % IV SOLN
2.0000 g | INTRAVENOUS | Status: DC
  Administered 2019-05-25: 2 g via INTRAVENOUS
  Filled 2019-05-25: qty 20

## 2019-05-25 MED ORDER — APIXABAN 2.5 MG PO TABS: 2.5000 mg | ORAL_TABLET | Freq: Two times a day (BID) | ORAL | 0 refills | Status: DC

## 2019-05-25 MED ORDER — TAMSULOSIN HCL 0.4 MG PO CAPS
0.4000 mg | ORAL_CAPSULE | Freq: Every day | ORAL | Status: DC
  Administered 2019-05-25: 0.4 mg via ORAL
  Filled 2019-05-25: qty 1

## 2019-05-25 MED ORDER — APIXABAN 2.5 MG PO TABS: 2.5000 mg | ORAL_TABLET | Freq: Two times a day (BID) | ORAL | Status: DC

## 2019-05-25 MED ORDER — INSULIN ASPART 100 UNIT/ML ~~LOC~~ SOLN
0.0000 [IU] | Freq: Three times a day (TID) | SUBCUTANEOUS | Status: DC
  Administered 2019-05-25: 2 [IU] via SUBCUTANEOUS
  Administered 2019-05-25: 3 [IU] via SUBCUTANEOUS
  Administered 2019-05-25: 5 [IU] via SUBCUTANEOUS

## 2019-05-25 MED ORDER — INSULIN GLARGINE 100 UNIT/ML ~~LOC~~ SOLN
15.0000 [IU] | Freq: Every day | SUBCUTANEOUS | Status: DC
  Filled 2019-05-25: qty 0.15

## 2019-05-25 MED ORDER — TORSEMIDE 20 MG PO TABS
60.0000 mg | ORAL_TABLET | Freq: Every day | ORAL | Status: DC
  Administered 2019-05-25: 60 mg via ORAL
  Filled 2019-05-25: qty 3

## 2019-05-25 NOTE — ED Notes (Signed)
ED TO INPATIENT HANDOFF REPORT  ED Nurse Name and Phone #: Annie Main [phone]  S Name/Age/Gender Charles [name] 85 y.o. male Room/Bed: 027C/027C  Code Status   Code Status: Full Code  Home/SNF/Other Home Patient oriented to: self, place, time and situation Is this baseline? Yes   Triage Complete: Triage complete  Chief Complaint fluid retention  Triage Note Pt has been retaining fluid for 2 weeks. PT was seen on Tuesday for fluid overload, but has not improved. PT's abd and lower legs swelling. Pt wears home O2. (unsure of how many liters). PT increasingly SOB. Denies CP   Allergies Allergies  Allergen Reactions  . Gabapentin Other (See Comments)    Leg pain, weakness   . Metoprolol Swelling  . Spironolactone Other (See Comments)    Painful gynecomastia  . Amlodipine Other (See Comments)    Edema  . Other Other (See Comments)    Horse serum - myalgias  . Rosiglitazone Maleate Other (See Comments)    REACTION: Did not help  . Tricor [Fenofibrate] Other (See Comments)    myalgias    Level of Care/Admitting Diagnosis ED Disposition    ED Disposition Condition Comment   Admit  Hospital Area: [facility] [[id number]]  Level of Care: Telemetry Cardiac [103]  I expect the patient will be discharged within 24 hours: No (not a candidate for 5C-Observation unit)  Covid Evaluation: Confirmed COVID Negative  Diagnosis: CAP (community acquired pneumoniaUT:[id number]  Admitting Physician: Etta Quill [phone]  Attending Physician: Etta Quill [[id number]  PT Class (Do Not Modify): Observation [104]  PT Acc Code (Do Not Modify): Observation [10022]       B Medical/Surgery History Past Medical History:  Diagnosis Date  . Abdominal aortic atherosclerosis ([place])    by xray  . Arthritis    in lower back (06/24/2015)  . Atrial flutter ([place])    notes 06/24/2015  . AV block, [place] 1 09/06/2017   Archie Endo 09/06/2017  . BRVO (branch retinal vein occlusion)  2015   bilateral [facility])  . CAD (coronary artery disease) 2004   a. s/p CABG in 2004 with LIMA-LAD, SVG-RCA, SVG-OM, and SVG-RI  . Carotid stenosis 1999   s/p L CEA  . Chronic diastolic CHF (congestive heart failure) ([place])   . Colon polyp 2005   (Dr. Tiffany Kocher)  . Compression fracture of L1 lumbar vertebra (HCC) remote  . COPD (chronic obstructive pulmonary disease) ([place]) 03/2011   by xray  . Depression    hx  . GERD (gastroesophageal reflux disease) 2003   h/o duodenal ulcer per EGD as well as esophagitis  . History of colon polyps 2003, 2005   adenomatous Vira Agar)  . History of tobacco abuse   . HLD (hyperlipidemia)   . HTN (hypertension)   . Presence of permanent cardiac pacemaker   . Psoriasis   . Renal artery stenosis ([place]) 2004   70% bilateral, followed by cards  . 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 Severe aortic stenosis   . T2DM (type 2 diabetes mellitus) ([place]) 1995  . Urge incontinence of urine    Past Surgical History:  Procedure Laterality Date  . CARDIAC CATHETERIZATION  03/06/2003   No intervention - recommend CABG  . CARDIOVASCULAR STRESS TEST  11/2010   normal perfusion, no evidence of ischemia, EF 62% post exercise  . CARDIOVASCULAR STRESS TEST  11/28/2012   Mild diaphragmatic attenuation; cannot exclude a focal region of nontransmural inferior scar  . CARDIOVERSION N/A 06/25/2015   Procedure: CARDIOVERSION;  Surgeon: Pixie Casino, MD;  Location: [facility] ENDOSCOPY;  Service: Cardiovascular;  Laterality: N/A;  . CARDIOVERSION N/A 11/02/2016   Procedure: CARDIOVERSION;  Surgeon: [name], MD;  Location: [facility];  Service: Cardiovascular;  Laterality: N/A;  . CARDIOVERSION N/A 03/20/2019   Procedure: CARDIOVERSION;  Surgeon: Jerline Pain, MD;  Location: [facility];  Service: Cardiovascular;  Laterality: N/A;  . CAROTID ENDARTERECTOMY  Left 1999   ([place])  . CATARACT EXTRACTION W/ INTRAOCULAR LENS  IMPLANT, BILATERAL Bilateral 01/2013   [place]  . COLONOSCOPY  2003   colon polyp x3 - adenomatous Tiffany Kocher)  . COLONOSCOPY  10/08/2012   2 TA, diverticulosis, int hem, no rpt rec Tiffany Kocher)  . CORONARY ANGIOPLASTY    . CORONARY ARTERY BYPASS GRAFT  03/07/2003   4v CABG ([place]) with LIMA to LAD, vein graft to RCA, 1st obtuse marginal, and ramus intermedius  . ESOPHAGOGASTRODUODENOSCOPY  10/08/2012   nl esophagus, duodenitis and erosive gastropathy, path - gastropathy no Hpylori, no rpt rec  . LUMBAR EPIDURAL INJECTION  03/2017   L5/S1 ([place])  . PACEMAKER IMPLANT N/A 04/21/2018   Procedure: PACEMAKER IMPLANT -- Dual Chamber;  Surgeon: Deboraha Sprang, MD;  Location: [facility] CV LAB;  Service: Cardiovascular;  Laterality: N/A;  . PERIPHERAL VASCULAR CATHETERIZATION N/A 02/02/2016   Procedure: Renal Angiography;  Surgeon: [name], MD;  Location: [facility] LAB;  Service: Cardiovascular;  Laterality: N/A;  . PERIPHERAL VASCULAR CATHETERIZATION N/A 02/02/2016   Procedure: Abdominal Aortogram;  Surgeon: [name], MD;  Location: [facility] CV LAB;  Service: Cardiovascular;  Laterality: N/A;  . RENAL DOPPLER  11/29/2011   Celiac&SMA-demonstrated vessel narrowing suggestive of a greater than 50% diameter reduction. Bilateral renal arteries-demonstrated vessel narrowing of 60-99% diameter reduction. Rt Kidney-mid pole lateral simple cyst noted measuring 1.29x0.76x1.11cm and exophytic cyst outside lower pole measuring 1.23x0.96x1.31. Lft Kidney-lateral mid to lower pole simple cyst measuering-1.24x9.83x1.24  . RIGHT/LEFT HEART CATH AND CORONARY/GRAFT ANGIOGRAPHY N/A 10/20/2017   Procedure: RIGHT/LEFT HEART CATH AND CORONARY/GRAFT ANGIOGRAPHY;  Surgeon: Burnell Blanks, MD;  Location: [facility] CV LAB;  Service: Cardiovascular;  Laterality: N/A;  . TEE WITHOUT CARDIOVERSION N/A 06/25/2015   Procedure: TRANSESOPHAGEAL  ECHOCARDIOGRAM (TEE);  Surgeon: Pixie Casino, MD;  Location: [facility] ENDOSCOPY;  Service: Cardiovascular;  Laterality: N/A;  . TEE WITHOUT CARDIOVERSION N/A 11/02/2016   Procedure: TRANSESOPHAGEAL ECHOCARDIOGRAM (TEE);  Surgeon: [name], MD;  Location: [facility];  Service: Cardiovascular;  Laterality: N/A;  . TEE WITHOUT CARDIOVERSION N/A 12/05/2018   Procedure: TRANSESOPHAGEAL ECHOCARDIOGRAM (TEE);  Surgeon: Sherren Mocha, MD;  Location: [facility];  Service: Open Heart Surgery;  Laterality: N/A;  . TONSILLECTOMY    . TRANSCATHETER AORTIC VALVE REPLACEMENT, TRANSFEMORAL N/A 12/05/2018   Procedure: TRANSCATHETER AORTIC VALVE REPLACEMENT, TRANSFEMORAL;  Surgeon: Sherren Mocha, MD;  Location: [facility];  Service: Open Heart Surgery;  Laterality: N/A;  . UPPER GASTROINTESTINAL ENDOSCOPY  2003   reflux esophagitis, erosive gastropathy, duodenal ulcer     A IV Location/Drains/Wounds Patient Lines/Drains/Airways Status   Active Line/Drains/Airways    Name:   Placement date:   Placement time:   Site:   Days:   Peripheral IV 12/25/18 Left Hand   12/25/18    -    Hand   151   Peripheral IV 05/25/19 Right Antecubital   05/25/19    0002  Antecubital   less than 1   External Urinary Catheter   03/19/19    2154    -   67   Incision (Closed) 12/05/18 Groin Right   12/05/18    1319     171   Incision (Closed) 12/05/18 Groin Left   12/05/18    1319     171   Pressure Injury 03/19/19 Buttocks Left skin tear, pink   03/19/19    2140     67          Intake/Output Last 24 hours No intake or output data in the 24 hours ending 05/25/19 0225  Labs/Imaging Results for orders placed or performed during the hospital encounter of 05/24/19 (from the past 48 hour(s))  Basic metabolic panel     Status: Abnormal   Collection Time: 05/24/19  4:16 PM  Result Value Ref Range   Sodium 133 (L) 135 - 145 mmol/L   Potassium 4.8 3.5 - 5.1 mmol/L   Chloride 95 (L) 98 - 111 mmol/L   CO2 24 22 - 32 mmol/L   Glucose,  Bld 220 (H) 70 - 99 mg/dL   BUN 57 (H) 8 - 23 mg/dL   Creatinine, Ser 2.15 (H) 0.61 - 1.24 mg/dL   Calcium 9.0 8.9 - 10.3 mg/dL   GFR calc non Af Amer 27 (L) >60 mL/min   GFR calc Af Amer 32 (L) >60 mL/min   Anion gap 14 5 - 15    Comment: Performed at [facility], [address], [place], [place] [address]  CBC     Status: Abnormal   Collection Time: 05/24/19  4:16 PM  Result Value Ref Range   WBC 6.1 4.0 - 10.5 K/uL   RBC 3.59 (L) 4.22 - 5.81 MIL/uL   Hemoglobin 10.5 (L) 13.0 - 17.0 g/dL   HCT 33.0 (L) 39.0 - 52.0 %   MCV 91.9 80.0 - 100.0 fL   MCH 29.2 26.0 - 34.0 pg   MCHC 31.8 30.0 - 36.0 g/dL   RDW 14.6 11.5 - 15.5 %   Platelets 135 (L) 150 - 400 K/uL    Comment: REPEATED TO VERIFY   nRBC 0.0 0.0 - 0.2 %    Comment: Performed at [facility], [place] [address] [place], [place] [address]  Troponin I (High Sensitivity)     Status: Abnormal   Collection Time: 05/24/19 11:58 PM  Result Value Ref Range   Troponin I (High Sensitivity) 107 (HH) <18 ng/L    Comment: CRITICAL RESULT CALLED TO, READ BACK BY AND VERIFIED WITH: DAVIS,B RN 05/25/2019 0114 JORDANS (NOTE) Elevated high sensitivity troponin I (hsTnI) values and significant  changes across serial measurements may suggest ACS but many other  chronic and acute conditions are known to elevate hsTnI results.  Refer to the Links section for chest pain algorithms and additional  guidance. Performed at [facility], [place] [address], [place], [place] [address]   Brain natriuretic peptide     Status: Abnormal   Collection Time: 05/24/19 11:58 PM  Result Value Ref Range   B Natriuretic Peptide 178.5 (H) 0.0 - 100.0 pg/mL    Comment: Performed at [place] [address] [place],  [address]  SARS Coronavirus 2 [facility] order, Performed in [facility] hospital lab) Nasopharyngeal Nasopharyngeal Swab     Status: None   Collection Time: 05/25/19 12:11 AM   Specimen: Nasopharyngeal Swab  Result  Value Ref Range  SARS Coronavirus 2 NEGATIVE NEGATIVE    Comment: (NOTE) If result is NEGATIVE SARS-CoV-2 target nucleic acids are NOT DETECTED. The SARS-CoV-2 RNA is generally detectable in upper and lower  respiratory specimens during the acute phase of infection. The lowest  concentration of SARS-CoV-2 viral copies this assay can detect is 250  copies / mL. A negative result does not preclude SARS-CoV-2 infection  and should not be used as the sole basis for treatment or other  patient management decisions.  A negative result may occur with  improper specimen collection / handling, submission of specimen other  than nasopharyngeal swab, presence of viral mutation(s) within the  areas targeted by this assay, and inadequate number of viral copies  (<250 copies / mL). A negative result must be combined with clinical  observations, patient history, and epidemiological information. If result is POSITIVE SARS-CoV-2 target nucleic acids are DETECTED. The SARS-CoV-2 RNA is generally detectable in upper and lower  respiratory specimens dur ing the acute phase of infection.  Positive  results are indicative of active infection with SARS-CoV-2.  Clinical  correlation with patient history and other diagnostic information is  necessary to determine patient infection status.  Positive results do  not rule out bacterial infection or co-infection with other viruses. If result is PRESUMPTIVE POSTIVE SARS-CoV-2 nucleic acids MAY BE PRESENT.   A presumptive positive result was obtained on the submitted specimen  and confirmed on repeat testing.  While 2019 novel coronavirus  (SARS-CoV-2) nucleic acids may be present in the submitted sample  additional confirmatory testing may be necessary for epidemiological  and / or clinical management purposes  to differentiate between  SARS-CoV-2 and other Sarbecovirus currently known to infect humans.  If clinically indicated additional testing with an  alternate test  methodology [phone]) is advised. The SARS-CoV-2 RNA is generally  detectable in upper and lower respiratory sp ecimens during the acute  phase of infection. The expected result is Negative. Fact Sheet for Patients:  StrictlyIdeas.no Fact Sheet for Healthcare Providers: BankingDealers.co.za This test is not yet approved or cleared by the [place] FDA and has been authorized for detection and/or diagnosis of SARS-CoV-2 by FDA under an Emergency Use Authorization (EUA).  This EUA will remain in effect (meaning this test can be used) for the duration of the COVID-19 declaration under Section 564(b)(1) of the Act, 21 U.S.C. section 360bbb-3(b)(1), unless the authorization is terminated or revoked sooner. Performed at [facility], [place] [address] [place], [place] [address]    Dg Chest 2 View  Result Date: 05/24/2019 CLINICAL DATA:  Feet edema EXAM: CHEST - 2 VIEW COMPARISON:  03/19/2019, 01/26/2019, 12/25/2018 FINDINGS: Post sternotomy changes and valve prosthesis. Similar appearance of left-sided pacing device. Right lung grossly clear. Small left pleural effusion suspected. Increased airspace disease at the left base compared to prior. Stable enlarged cardiomediastinal silhouette with aortic atherosclerosis. IMPRESSION: 1. Probable small left effusion. Increased airspace disease at the left base, suspect for acute on chronic basilar opacity, possibly atelectasis or a pneumonia Electronically Signed   By: Donavan Foil M.D.   On: 05/24/2019 17:17    Pending Labs Unresulted Labs (From admission, onward)    Start     Ordered   05/25/19 0500  CBC  Tomorrow morning,   R     05/25/19 0222   05/25/19 [id number]  Basic metabolic panel  Tomorrow morning,   R     05/25/19 0222   05/25/19 0220  Procalcitonin - Baseline  ONCE -  STAT,   STAT     05/25/19 0219   05/25/19 0220  HIV antibody (Routine Screening)  Once,   STAT      05/25/19 0222   05/25/19 0040  Blood culture (routine x 2)  BLOOD CULTURE X 2,   STAT     05/25/19 0039          Vitals/Pain Today's Vitals   05/25/19 0053 05/25/19 0115 05/25/19 0130 05/25/19 0222  BP: (!) 106/57 125/67 123/71 128/73  Pulse: 70 69 70 70  Resp: (!) 35 (!) 24 (!) 27 (!) 25  Temp:      TempSrc:      SpO2: 96% 97% 98% 96%  Weight:      Height:      PainSc:        Isolation Precautions Airborne and Contact precautions  Medications Medications  vancomycin (VANCOCIN) 2,000 mg in sodium chloride 0.9 % 500 mL IVPB (2,000 mg Intravenous New Bag/Given 05/25/19 0221)  cefTRIAXone (ROCEPHIN) 2 g in sodium chloride 0.9 % 100 mL IVPB (has no administration in time range)  azithromycin (ZITHROMAX) tablet 500 mg (has no administration in time range)  sodium chloride flush (NS) 0.9 % injection 3 mL (3 mLs Intravenous Given 05/25/19 0119)  ceFEPIme (MAXIPIME) 2 g in sodium chloride 0.9 % 100 mL IVPB (0 g Intravenous Stopped 05/25/19 0150)  furosemide (LASIX) injection 40 mg (40 mg Intravenous Given 05/25/19 0223)    Mobility walks with person assist Moderate fall risk   Focused Assessments Pulmonary Assessment Handoff:  Lung sounds: Bilateral Breath Sounds: Fine crackles L Breath Sounds: Fine crackles R Breath Sounds: Fine crackles O2 Device: Nasal Cannula O2 Flow Rate (L/min): 2 L/min      R Recommendations: See Admitting Provider Note  Report given to:   Additional Notes:

## 2019-05-25 NOTE — Progress Notes (Signed)
Pharmacy monitoring Eliquis peripherally, but noticed renal function is worsening.  Reduce Eliquis to 2.5mg  PO BID due to age > 80 and SCr > 1.5.  Start this dose 05/26/19 as patient already received Eliquis 5mg  PO x 2 this morning.  No bleeding documented.  Abdulloh Ullom D. Mina Marble, PharmD, BCPS, [place] 05/25/2019, 10:07 AM

## 2019-05-25 NOTE — Evaluation (Signed)
Physical Therapy Evaluation Patient Details Name: Charles [name] MRN: [id number] DOB: [birth year] Today's Date: 05/25/2019   History of Present Illness  Pt is a 85 y.o. M with significant PMH of CAD s/p CABG, RAS, HTN, L CEA, DM2, PAF, AS s/p TAVR in March 2020. Presents with dyspnea and bilateral pedal edema. CXR in ED showing a possible LL pneumonia.   Clinical Impression  Pt admitted with above diagnosis. Pt reports he is close to functional baseline. Prior to admission, pt lives alone with assist with IADL's from daughter in law. Denies recent falls. Ambulating to bathroom and back with walker and min guard assist, SpO2 97% on 2L O2. Demonstrates generalized weakness and decreased activity tolerance. Pt also with decreased safety awareness in regards to mobility and line management. Recommend HHPT to address deficits and maximize functional independence.      Follow Up Recommendations Home health PT;Supervision for mobility/OOB [facility] aide)    Equipment Recommendations  None recommended by PT    Recommendations for Other Services OT consult     Precautions / Restrictions Precautions Precautions: Fall Restrictions Weight Bearing Restrictions: No      Mobility  Bed Mobility Overal bed mobility: Modified Independent             General bed mobility comments: use of bed rail  Transfers Overall transfer level: Needs assistance Equipment used: Rolling walker (2 wheeled) Transfers: Sit to/from Stand Sit to Stand: Supervision            Ambulation/Gait Ambulation/Gait assistance: Min guard Gait Distance (Feet): 20 Feet Assistive device: Rolling walker (2 wheeled) Gait Pattern/deviations: Step-through pattern;Decreased stride length;Trunk flexed Gait velocity: decreased Gait velocity interpretation: <1.8 ft/sec, indicate of risk for recurrent falls General Gait Details: trunk flexed posture (pt states this is his baseline), difficulty propelling walker, min guard  assist for stability, cues for safety i.e. line management  Stairs            Wheelchair Mobility    Modified Rankin (Stroke Patients Only)       Balance Overall balance assessment: Needs assistance Sitting-balance support: Feet supported Sitting balance-Leahy Scale: Good     Standing balance support: Bilateral upper extremity supported;During functional activity Standing balance-Leahy Scale: Poor Standing balance comment: reliant on external support                             Pertinent Vitals/Pain Pain Assessment: No/denies pain    Home Living Family/patient expects to be discharged to:: Private residence Living Arrangements: Alone Available Help at Discharge: Family;Available PRN/intermittently Type of Home: House Home Access: Level entry     Home Layout: One level Home Equipment: Walker - 4 wheels;Shower seat;Cane - single point;Grab bars - tub/shower Additional Comments: Son and daughter in [place] very close by and checks on pt as needed.    Prior Function Level of Independence: Independent with assistive device(s)         Comments: used rollator all the time per pt     Hand Dominance   Dominant Hand: Right    Extremity/Trunk Assessment   Upper Extremity Assessment Upper Extremity Assessment: Generalized weakness    Lower Extremity Assessment Lower Extremity Assessment: Generalized weakness    Cervical / Trunk Assessment Cervical / Trunk Assessment: Kyphotic  Communication   Communication: HOH  Cognition Arousal/Alertness: Awake/alert Behavior During Therapy: WFL for tasks assessed/performed Overall Cognitive Status: Within Functional Limits for tasks assessed  General Comments      Exercises     Assessment/Plan    PT Assessment Patient needs continued PT services  PT Problem List Decreased strength;Decreased activity tolerance;Decreased mobility;Decreased  balance;Decreased safety awareness       PT Treatment Interventions DME instruction;Gait training;Functional mobility training;Therapeutic activities;Therapeutic exercise;Balance training;Patient/family education    PT Goals (Current goals can be found in the Care Plan section)  Acute Rehab PT Goals Patient Stated Goal: "go home to see my daughter in law." PT Goal Formulation: With patient Time For Goal Achievement: 06/08/19 Potential to Achieve Goals: Good    Frequency Min 3X/week   Barriers to discharge        Co-evaluation               AM-PAC PT "6 Clicks" Mobility  Outcome Measure Help needed turning from your back to your side while in a flat bed without using bedrails?: None Help needed moving from lying on your back to sitting on the side of a flat bed without using bedrails?: None Help needed moving to and from a bed to a chair (including a wheelchair)?: A Little Help needed standing up from a chair using your arms (e.g., wheelchair or bedside chair)?: A Little Help needed to walk in hospital room?: A Little Help needed climbing 3-5 steps with a railing? : A Lot 6 Click Score: 19    End of Session Equipment Utilized During Treatment: Oxygen Activity Tolerance: Patient tolerated treatment well Patient left: in bed;with call bell/phone within reach Nurse Communication: Mobility status PT Visit Diagnosis: Unsteadiness on feet (R26.81);Difficulty in walking, not elsewhere classified (R26.2)    Time: [id number] PT Time Calculation (min) (ACUTE ONLY): 29 min   Charges:   PT Evaluation $PT Eval Moderate Complexity: 1 Mod PT Treatments $Therapeutic Activity: 8-22 mins        Ellamae Sia, PT, DPT Acute Rehabilitation Services Pager [phone] Office [phone]   Willy Eddy 05/25/2019, 3:36 PM

## 2019-05-25 NOTE — Progress Notes (Addendum)
Charles [name] is a 85 y.o. male with a hx of CAD, s/p CABG X 4, 03/2003, 70% bilateral renal artery stenosis, LCEA, HTN, HLD, DM-2, A flutter on eliquis, AS s/p TAVR 12/2018 and PPM who admitted for acute diastolic CHF in setting of possible CAP and AKI.   BNP 178 Scr 2.01 from 2.15  Given IV lasix 40mg  x1 >> now transitioned to Torsemide 60mg  am and 40mg  PM Strict I & O and daily weight  Follow renal function closely  Leanor Kail, PA-C  I agree with the above. [name] was seen this am by the overnight cardiology team. Please see full consult note from 2am. He is diuresing well. He is being treated for a possible pneumonia by the primary team. We will follow up tomorrow.   Lauree Chandler 05/25/2019 9:12 AM

## 2019-05-25 NOTE — Consult Note (Signed)
Cardiology Consultation:   Patient ID: Charles [name] MRN: [id number]; DOB: [birth year]  Admit date: 05/24/2019 Date of Consult: 05/25/2019  Primary Care Provider: [name], MD Primary Cardiologist: [name], MD  Primary Electrophysiologist:  Dr. Caryl Comes   Patient Profile:   Charles [name] is a 85 y.o. male with a hx of CAD, s/p CABG X 4, 03/2003 with a LIMA to his LAD; a vein to the right coronary artery, obtuse marginal branch and ramus branch, 70% bilateral renal artery stenosis, LCEA, HTN, HLD, DM-2, A flutter on eliquis, AS s/p TAVR 12/2018 and PPM who presents to the ER tonight with shortness of breath.  History of Present Illness:   [name] states that for the past week he has felt more short of breath and had new swelling in his ankles. He was seen in the cardiology clinic on 8/18 and given metolazone 2.5mg  x 2 doses for this and feels that his ankles have improved somewhat but are still much larger to him than normal. He feels that his breathing is the same when lying flat compared to when sitting up. He was put on 2L home oxygen about a month ago and thinks this has significantly helped his breathing. He has been compliant with all of his home medications but does not that he eats more salty foods than he should (typically prepared foods). He denies any cough, fevers, chills, lightheadedness, dizziness, chest pain, palpitations, orthopnea or PND. He does feel more swollen in his abdomen. He has not weighed himself at home today.   Heart Pathway Score:     Past Medical History:  Diagnosis Date  . Abdominal aortic atherosclerosis ([place])    by xray  . Arthritis    in lower back (06/24/2015)  . Atrial flutter ([place])    notes 06/24/2015  . AV block, [place] 1 09/06/2017   Archie Endo 09/06/2017  . 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 CAD (coronary artery disease) 2004   a. s/p CABG in 2004 with LIMA-LAD, SVG-RCA, SVG-OM, and SVG-RI  . Carotid  stenosis 1999   s/p L CEA  . Chronic diastolic CHF (congestive heart failure) ([place])   . Colon polyp 2005   (Dr. Tiffany Kocher)  . Compression fracture of L1 lumbar vertebra (HCC) remote  . COPD (chronic obstructive pulmonary disease) ([place]) 03/2011   by xray  . Depression    hx  . GERD (gastroesophageal reflux disease) 2003   h/o duodenal ulcer per EGD as well as esophagitis  . History of colon polyps 2003, 2005   adenomatous Vira Agar)  . History of tobacco abuse   . HLD (hyperlipidemia)   . HTN (hypertension)   . Presence of permanent cardiac pacemaker   . Psoriasis   . Renal artery stenosis ([place]) 2004   70% bilateral, followed by cards  . 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 Severe aortic stenosis   . T2DM (type 2 diabetes mellitus) ([place]) 1995  . Urge incontinence of urine     Past Surgical History:  Procedure Laterality Date  . CARDIAC CATHETERIZATION  03/06/2003   No intervention - recommend CABG  . CARDIOVASCULAR STRESS TEST  11/2010   normal perfusion, no evidence of ischemia, EF 62% post exercise  . CARDIOVASCULAR STRESS TEST  11/28/2012   Mild diaphragmatic attenuation; cannot exclude a focal region of nontransmural inferior scar  .  CARDIOVERSION N/A 06/25/2015   Procedure: CARDIOVERSION;  Surgeon: Pixie Casino, MD;  Location: [facility] ENDOSCOPY;  Service: Cardiovascular;  Laterality: N/A;  . CARDIOVERSION N/A 11/02/2016   Procedure: CARDIOVERSION;  Surgeon: [name], MD;  Location: [place];  Service: Cardiovascular;  Laterality: N/A;  . CARDIOVERSION N/A 03/20/2019   Procedure: CARDIOVERSION;  Surgeon: Jerline Pain, MD;  Location: [place];  Service: Cardiovascular;  Laterality: N/A;  . CAROTID ENDARTERECTOMY Left 1999   ([place])  . CATARACT EXTRACTION W/ INTRAOCULAR LENS  IMPLANT, BILATERAL Bilateral 01/2013   [place]  . COLONOSCOPY  2003   colon  polyp x3 - adenomatous Tiffany Kocher)  . COLONOSCOPY  10/08/2012   2 TA, diverticulosis, int hem, no rpt rec Tiffany Kocher)  . CORONARY ANGIOPLASTY    . CORONARY ARTERY BYPASS GRAFT  03/07/2003   4v CABG ([place]) with LIMA to LAD, vein graft to RCA, 1st obtuse marginal, and ramus intermedius  . ESOPHAGOGASTRODUODENOSCOPY  10/08/2012   nl esophagus, duodenitis and erosive gastropathy, path - gastropathy no Hpylori, no rpt rec  . LUMBAR EPIDURAL INJECTION  03/2017   L5/S1 ([place])  . PACEMAKER IMPLANT N/A 04/21/2018   Procedure: PACEMAKER IMPLANT -- Dual Chamber;  Surgeon: Deboraha Sprang, MD;  Location: [place] CV LAB;  Service: Cardiovascular;  Laterality: N/A;  . PERIPHERAL VASCULAR CATHETERIZATION N/A 02/02/2016   Procedure: Renal Angiography;  Surgeon: [name], MD;  Location: [place] CV LAB;  Service: Cardiovascular;  Laterality: N/A;  . PERIPHERAL VASCULAR CATHETERIZATION N/A 02/02/2016   Procedure: Abdominal Aortogram;  Surgeon: [name], MD;  Location: [place] CV LAB;  Service: Cardiovascular;  Laterality: N/A;  . RENAL DOPPLER  11/29/2011   Celiac&SMA-demonstrated vessel narrowing suggestive of a greater than 50% diameter reduction. Bilateral renal arteries-demonstrated vessel narrowing of 60-99% diameter reduction. Rt Kidney-mid pole lateral simple cyst noted measuring 1.29x0.76x1.11cm and exophytic cyst outside lower pole measuring 1.23x0.96x1.31. Lft Kidney-lateral mid to lower pole simple cyst measuering-1.24x9.83x1.24  . RIGHT/LEFT HEART CATH AND CORONARY/GRAFT ANGIOGRAPHY N/A 10/20/2017   Procedure: RIGHT/LEFT HEART CATH AND CORONARY/GRAFT ANGIOGRAPHY;  Surgeon: Burnell Blanks, MD;  Location: [place] CV LAB;  Service: Cardiovascular;  Laterality: N/A;  . TEE WITHOUT CARDIOVERSION N/A 06/25/2015   Procedure: TRANSESOPHAGEAL ECHOCARDIOGRAM (TEE);  Surgeon: Pixie Casino, MD;  Location: [facility] ENDOSCOPY;  Service: Cardiovascular;  Laterality: N/A;  . TEE WITHOUT  CARDIOVERSION N/A 11/02/2016   Procedure: TRANSESOPHAGEAL ECHOCARDIOGRAM (TEE);  Surgeon: [name], MD;  Location: [place];  Service: Cardiovascular;  Laterality: N/A;  . TEE WITHOUT CARDIOVERSION N/A 12/05/2018   Procedure: TRANSESOPHAGEAL ECHOCARDIOGRAM (TEE);  Surgeon: Sherren Mocha, MD;  Location: [place];  Service: Open Heart Surgery;  Laterality: N/A;  . TONSILLECTOMY    . TRANSCATHETER AORTIC VALVE REPLACEMENT, TRANSFEMORAL N/A 12/05/2018   Procedure: TRANSCATHETER AORTIC VALVE REPLACEMENT, TRANSFEMORAL;  Surgeon: Sherren Mocha, MD;  Location: [place];  Service: Open Heart Surgery;  Laterality: N/A;  . UPPER GASTROINTESTINAL ENDOSCOPY  2003   reflux esophagitis, erosive gastropathy, duodenal ulcer     Home Medications:  Prior to Admission medications   Medication Sig Start Date End Date Taking? Authorizing Provider  acetaminophen (TYLENOL) 500 MG tablet Take 1 tablet (500 mg total) by mouth 3 (three) times daily. 02/17/16   [name], MD  atorvastatin (LIPITOR) 20 MG tablet Take 1 tablet (20 mg total) by mouth daily. 08/01/18   [name], MD  BD INSULIN SYRINGE U/F 30G X 1/2" 0.5 ML MISC AS DIRECTED. 03/06/19   [name],  Garlon Hatchet, MD  citalopram (CELEXA) 20 MG tablet Take 1 tablet (20 mg total) by mouth daily. 08/01/18   [name], MD  ELIQUIS 5 MG TABS tablet Take 1 tablet (5 mg total) by mouth 2 (two) times daily. 01/10/19   [name], MD  finasteride (PROSCAR) 5 MG tablet Take 1 tablet (5 mg total) by mouth daily. 04/03/19   [name], MD  glucose blood (ACCU-CHEK AVIVA PLUS) test strip Use as instructed to check blood sugar three times a day. Dx code E11.8 & E11.65 04/10/19   [name], MD  guaiFENesin (MUCINEX) 600 MG 12 hr tablet Take 600 mg by mouth 2 (two) times daily as needed for cough or to loosen phlegm.    [provider]  LANTUS 100 UNIT/ML injection Inject 0.2 mLs (20 Units total) into the skin at bedtime. 02/19/19    [name], MD  lisinopril (PRINIVIL,ZESTRIL) 40 MG tablet Take 1 tablet (40 mg total) by mouth daily. 12/14/18 12/09/19  Eileen Stanford, PA-C  metFORMIN (GLUCOPHAGE) 1000 MG tablet Take 1 tablet (1,000 mg total) by mouth 2 (two) times daily with a meal. 05/18/19   [name], MD  metolazone (ZAROXOLYN) 2.5 MG tablet Take 1 tablet (2.5 mg total) by mouth daily. 05/22/19 08/20/19  Lendon Colonel, NP  nitroGLYCERIN (NITROSTAT) 0.4 MG SL tablet Place 1 tablet (0.4 mg total) under the tongue every 5 (five) minutes as needed for chest pain. 03/08/18   Almyra Deforest, PA  pantoprazole (PROTONIX) 20 MG tablet Take 1 tablet (20 mg total) by mouth daily. 02/28/19   [name], MD  potassium chloride SA (K-DUR) 20 MEQ tablet Take 1 tablet (20 mEq total) every Monday, Wednesday, Friday, Saturday, and Sunday. 04/09/19   [name], MD  tamsulosin (FLOMAX) 0.4 MG CAPS capsule Take 1 capsule (0.4 mg total) by mouth daily after supper. 04/09/19   [name], MD  torsemide (DEMADEX) 20 MG tablet Take 3 tablets (60 mg total) by mouth daily. Patient taking differently: Take 60 mg by mouth daily. Takes 3 tablets in the morning and 2 tablets at night. 03/29/19   Lendon Colonel, NP  traZODone (DESYREL) 50 MG tablet Take 1 tablet (50 mg total) by mouth at bedtime. 02/28/19   [name], MD    Inpatient Medications: Scheduled Meds: . furosemide  40 mg Intravenous Once   Continuous Infusions: . vancomycin     PRN Meds:   Allergies:    Allergies  Allergen Reactions  . Gabapentin Other (See Comments)    Leg pain, weakness   . Metoprolol Swelling  . Spironolactone Other (See Comments)    Painful gynecomastia  . Amlodipine Other (See Comments)    Edema  . Other Other (See Comments)    Horse serum - myalgias  . Rosiglitazone Maleate Other (See Comments)    REACTION: Did not help  . Tricor [Fenofibrate] Other (See Comments)    myalgias    Social History:   Social  History   Socioeconomic History  . Marital status: Widowed    Spouse name: Not on file  . Number of children: Not on file  . Years of education: Not on file  . Highest education level: Not on file  Occupational History  . Not on file  Social Needs  . Financial resource strain: Not on file  . Food insecurity    Worry: Not on file    Inability: Not on file  . Transportation needs    Medical: Not  on file    Non-medical: Not on file  Tobacco Use  . Smoking status: Former Smoker    Packs/day: 0.50    Years: 36.00    Pack years: 18.00    Types: Cigarettes    Quit date: 07/23/2013    Years since quitting: 5.8  . Smokeless tobacco: Never Used  Substance and Sexual Activity  . Alcohol use: No  . Drug use: No  . Sexual activity: Not Currently  Lifestyle  . Physical activity    Days per week: Not on file    Minutes per session: Not on file  . Stress: Not on file  Relationships  . Social Herbalist on phone: Not on file    Gets together: Not on file    Attends religious service: Not on file    Active member of club or organization: Not on file    Attends meetings of clubs or organizations: Not on file    Relationship status: Not on file  . Intimate partner violence    Fear of current or ex partner: Not on file    Emotionally abused: Not on file    Physically abused: Not on file    Forced sexual activity: Not on file  Other Topics Concern  . Not on file  Social History Narrative   Caffeine: 4 cups coffee/day, some tea and soda   Lives with wife   Occupation: Retired   Activity: no regular exercise   Diet: good water, fruits/vegetables daily    Family History:    Family History  Problem Relation Age of Onset  . Hypertension Father   . Heart disease Father   . Cancer Mother        GI cancer  . Diabetes Sister   . Heart disease Sister   . Stroke Sister   . Hypertension Sister   . Cancer Brother        Lung cancer - nonsmoker  . Cancer Maternal  Grandmother   . Heart attack Maternal Grandfather   . Heart attack Paternal Grandmother   . Heart attack Paternal Grandfather   . Cancer Brother        Pancreatic cancer  . Lung disease Brother      ROS:  Please see the history of present illness.   All other ROS reviewed and negative.     Physical Exam/Data:   Vitals:   05/25/19 0015 05/25/19 0053 05/25/19 0115 05/25/19 0130  BP: 140/60 (!) 106/57 125/67 123/71  Pulse: 71 70 69 70  Resp: (!) 23 (!) 35 (!) 24 (!) 27  Temp:      TempSrc:      SpO2: 96% 96% 97% 98%  Weight:      Height:       No intake or output data in the 24 hours ending 05/25/19 0150 Last 3 Weights 05/24/2019 05/22/2019 04/10/2019  Weight (lbs) 225 lb 228 lb 211 lb  Weight (kg) 102.059 kg 103.42 kg 95.709 kg     Body mass index is 29.69 kg/m.  General:  Well nourished, well developed, lying comfortably in bed, audibly wheezy HEENT: normal Lymph: no adenopathy Neck: JVD to just below the mandible when lying at 30 degrees Cardiac:  normal S1, S2; RRR; no murmurs, rubs or gallops Lungs:  Mildly tachypneic on 2L [place] with upper airway wheezing. Few, scattered, bibasilar crackles Abd: soft, nontender, no hepatomegaly  Ext: 2+ pitting edema bilaterally to the mid-shins Musculoskeletal:  No deformities Skin: warm  and dry  Neuro:  CNs 2-12 intact, no focal abnormalities noted Psych:  Normal affect   EKG:  The EKG was personally reviewed and demonstrates:  V-paced at 70bpm  Relevant CV Studies: Device interrogation 05/17/19- The patient is in a known persistent AF and is on [facility] according to the previous report. AF burden is 34% of the time   Echo 12/26/18  1. The left ventricle has low normal systolic function, with an ejection fraction of 50-55%. The cavity size was normal. There is moderately increased left ventricular wall thickness. Left ventricular diastolic Doppler parameters are consistent with  impaired relaxation. Elevated left atrial and left  ventricular end-diastolic pressures The E/e' is >20. There is abnormal septal motion consistent with RV pacemaker.  2. Moderate hypokinesis of the left ventricular, basal-mid septal wall.  3. The mitral valve is degenerative. Mild thickening of the mitral valve leaflet. Moderate calcification of the mitral valve leaflet. There is moderate mitral annular calcification present. Mild mitral valve stenosis.  4. The tricuspid valve is grossly normal.  5. A 26 an Edwards Edwards Sapien bioprosthetic aortic valve (TAVR) valve is present in the aortic position. Procedure Date: 12/05/2018 Echo findings are consistent with mild perivalvular leak of the aortic prosthesis.  6. Moderate calcification of the aortic valve. mild stenosis of the aortic valve.  7. Left atrial size was moderately dilated.  8. Right atrial size was moderately dilated.  9. The right ventricle has normal systolic function. The cavity was normal. There is no increase in right ventricular wall thickness. 10. When compared to the prior study: 12/06/2018: LVEF 55-60%.  Laboratory Data:  High Sensitivity Troponin:   Recent Labs  Lab 05/24/19 2358  TROPONINIHS 107*     Cardiac EnzymesNo results for input(s): TROPONINI in the last 168 hours. No results for input(s): TROPIPOC in the last 168 hours.  Chemistry Recent Labs  Lab 05/22/19 1126 05/24/19 1616  NA 140 133*  K 5.0 4.8  CL 101 95*  CO2 22 24  GLUCOSE 141* 220*  BUN 50* 57*  CREATININE 1.75* 2.15*  CALCIUM 9.6 9.0  GFRNONAA 35* 27*  GFRAA 40* 32*  ANIONGAP  --  14    No results for input(s): PROT, ALBUMIN, AST, ALT, ALKPHOS, BILITOT in the last 168 hours. Hematology Recent Labs  Lab 05/22/19 1126 05/24/19 1616  WBC 5.5 6.1  RBC 3.55* 3.59*  HGB 10.4* 10.5*  HCT 31.7* 33.0*  MCV 89 91.9  MCH 29.3 29.2  MCHC 32.8 31.8  RDW 14.3 14.6  PLT 140* 135*   BNP Recent Labs  Lab 05/22/19 1126 05/24/19 2358  BNP 202.4* 178.5*    DDimer No results for input(s):  DDIMER in the last 168 hours.   Radiology/Studies:  Dg Chest 2 View  Result Date: 05/24/2019 CLINICAL DATA:  Feet edema EXAM: CHEST - 2 VIEW COMPARISON:  03/19/2019, 01/26/2019, 12/25/2018 FINDINGS: Post sternotomy changes and valve prosthesis. Similar appearance of left-sided pacing device. Right lung grossly clear. Small left pleural effusion suspected. Increased airspace disease at the left base compared to prior. Stable enlarged cardiomediastinal silhouette with aortic atherosclerosis. IMPRESSION: 1. Probable small left effusion. Increased airspace disease at the left base, suspect for acute on chronic basilar opacity, possibly atelectasis or a pneumonia Electronically Signed   By: Donavan Foil M.D.   On: 05/24/2019 17:17    Assessment and Plan:    Charles [name] is a 85 y.o. male with a hx of CAD, s/p CABG X 4, 03/2003 with  a LIMA to his LAD; a vein to the right coronary artery, obtuse marginal branch and ramus branch, 70% bilateral renal artery stenosis, LCEA, HTN, HLD, DM-2, A flutter on eliquis, AS s/p TAVR 12/2018 and PPM who presents to the ER tonight with shortness of breath. His weight gain, lower leg swelling and bilateral crackles are consistent with a CHF exacerbation. He appears mildly volume overloaded on exam. His BNP is actually decreased from prior and I do not think he has a significant amount of fluid to give. His CXR is notable for possible pneumonia which he is being admitted to the medicine service for treatment. Would favor giving him a dose of IV lasix to see if his breathing, swelling and creatinine improve. If his creatinine continues to worsen, he may benefit from a RHC to more accurately assess his volume status.   - Lasix 40mg  IV (ordered) - Monitor strict I/Os, daily weights  - Continue home cardiac medications - Cardiology will continue to follow      For questions or updates, please contact [place] Please consult [email or web address] for contact info under      Signed, Princella Pellegrini, MD  05/25/2019 1:50 AM

## 2019-05-25 NOTE — ED Provider Notes (Signed)
[facility] EMERGENCY DEPARTMENT Provider Note   CSN: [id number] Arrival date & time: 05/24/19  1611     History   Chief Complaint Chief Complaint  Patient presents with  . Shortness of Breath  . Leg Swelling    HPI Charles [name] is a 85 y.o. male.     The history is provided by the patient.  Shortness of Breath Severity:  Severe Onset quality:  Gradual Duration:  2 weeks Timing:  Constant Progression:  Worsening Chronicity:  Recurrent Context: not activity, not smoke exposure and not URI   Relieved by:  Nothing Worsened by:  Nothing Ineffective treatments:  None tried Associated symptoms: no abdominal pain, no chest pain, no cough, no diaphoresis, no ear pain, no fever, no headaches, no rash, no sore throat, no syncope, no swollen glands and no vomiting   Risk factors: no recent alcohol use   Peripheral edema and worsening SOB.  No CP.  No f/c/r.    Past Medical History:  Diagnosis Date  . Abdominal aortic atherosclerosis ([place])    by xray  . Arthritis    in lower back (06/24/2015)  . Atrial flutter ([place])    notes 06/24/2015  . AV block, [place] 1 09/06/2017   Archie Endo 09/06/2017  . 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 CAD (coronary artery disease) 2004   a. s/p CABG in 2004 with LIMA-LAD, SVG-RCA, SVG-OM, and SVG-RI  . Carotid stenosis 1999   s/p L CEA  . Chronic diastolic CHF (congestive heart failure) ([place])   . Colon polyp 2005   (Dr. Tiffany Kocher)  . Compression fracture of L1 lumbar vertebra (HCC) remote  . COPD (chronic obstructive pulmonary disease) ([place]) 03/2011   by xray  . Depression    hx  . GERD (gastroesophageal reflux disease) 2003   h/o duodenal ulcer per EGD as well as esophagitis  . History of colon polyps 2003, 2005   adenomatous Vira Agar)  . History of tobacco abuse   . HLD (hyperlipidemia)   . HTN (hypertension)   . Presence of permanent cardiac pacemaker   . Psoriasis   . Renal artery  stenosis ([place]) 2004   70% bilateral, followed by cards  . 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 Severe aortic stenosis   . T2DM (type 2 diabetes mellitus) ([place]) 1995  . Urge incontinence of urine     Patient Active Problem List   Diagnosis Date Noted  . Acute diastolic HF (heart failure) ([place]) 03/19/2019  . CKD (chronic kidney disease), stage II 12/27/2018  . Dyspnea 12/26/2018  . S/P TAVR (transcatheter aortic valve replacement)   . 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 Paroxysmal atrial flutter ([place])   . Scrotal swelling 02/16/2018  . Coronary artery disease involving native coronary artery of native heart without angina pectoris   . Acute on chronic diastolic CHF (congestive heart failure) ([place]) 09/06/2017  . Urinary urgency 07/01/2017  . Chronic anticoagulation 11/05/2016  . Chronic radicular pain of lower back 02/17/2016  . Aortic stenosis, severe 06/04/2014  . DDD (degenerative disc disease), lumbar 10/02/2013  . Ex-smoker   . Carotid arterial disease ([place]) 08/23/2013  . MDD (major depressive disorder), recurrent episode, moderate ([place]) 09/21/2007  . OSTEOARTHRITIS 09/21/2007  . COLONIC POLYPS 01/31/2007  . Diabetes mellitus type 2, uncontrolled, with complications (  [place]) 01/31/2007  . HYPERCHOLESTEROLEMIA 01/31/2007  . ERECTILE DYSFUNCTION 01/31/2007  . Essential hypertension 01/31/2007  . GERD 01/31/2007  . PSORIASIS 01/31/2007    Past Surgical History:  Procedure Laterality Date  . CARDIAC CATHETERIZATION  03/06/2003   No intervention - recommend CABG  . CARDIOVASCULAR STRESS TEST  11/2010   normal perfusion, no evidence of ischemia, EF 62% post exercise  . CARDIOVASCULAR STRESS TEST  11/28/2012   Mild diaphragmatic attenuation; cannot exclude a focal region of nontransmural inferior scar  . CARDIOVERSION N/A 06/25/2015    Procedure: CARDIOVERSION;  Surgeon: Pixie Casino, MD;  Location: [facility] ENDOSCOPY;  Service: Cardiovascular;  Laterality: N/A;  . CARDIOVERSION N/A 11/02/2016   Procedure: CARDIOVERSION;  Surgeon: [name], MD;  Location: [facility];  Service: Cardiovascular;  Laterality: N/A;  . CARDIOVERSION N/A 03/20/2019   Procedure: CARDIOVERSION;  Surgeon: Jerline Pain, MD;  Location: [facility];  Service: Cardiovascular;  Laterality: N/A;  . CAROTID ENDARTERECTOMY Left 1999   ([place])  . CATARACT EXTRACTION W/ INTRAOCULAR LENS  IMPLANT, BILATERAL Bilateral 01/2013   [place]  . COLONOSCOPY  2003   colon polyp x3 - adenomatous Tiffany Kocher)  . COLONOSCOPY  10/08/2012   2 TA, diverticulosis, int hem, no rpt rec Tiffany Kocher)  . CORONARY ANGIOPLASTY    . CORONARY ARTERY BYPASS GRAFT  03/07/2003   4v CABG ([place]) with LIMA to LAD, vein graft to RCA, 1st obtuse marginal, and ramus intermedius  . ESOPHAGOGASTRODUODENOSCOPY  10/08/2012   nl esophagus, duodenitis and erosive gastropathy, path - gastropathy no Hpylori, no rpt rec  . LUMBAR EPIDURAL INJECTION  03/2017   L5/S1 ([place])  . PACEMAKER IMPLANT N/A 04/21/2018   Procedure: PACEMAKER IMPLANT -- Dual Chamber;  Surgeon: Deboraha Sprang, MD;  Location: [facility] CV LAB;  Service: Cardiovascular;  Laterality: N/A;  . PERIPHERAL VASCULAR CATHETERIZATION N/A 02/02/2016   Procedure: Renal Angiography;  Surgeon: [name], MD;  Location: [facility] CV LAB;  Service: Cardiovascular;  Laterality: N/A;  . PERIPHERAL VASCULAR CATHETERIZATION N/A 02/02/2016   Procedure: Abdominal Aortogram;  Surgeon: [name], MD;  Location: [facility] CV LAB;  Service: Cardiovascular;  Laterality: N/A;  . RENAL DOPPLER  11/29/2011   Celiac&SMA-demonstrated vessel narrowing suggestive of a greater than 50% diameter reduction. Bilateral renal arteries-demonstrated vessel narrowing of 60-99% diameter reduction. Rt Kidney-mid pole lateral simple cyst noted measuring  1.29x0.76x1.11cm and exophytic cyst outside lower pole measuring 1.23x0.96x1.31. Lft Kidney-lateral mid to lower pole simple cyst measuering-1.24x9.83x1.24  . RIGHT/LEFT HEART CATH AND CORONARY/GRAFT ANGIOGRAPHY N/A 10/20/2017   Procedure: RIGHT/LEFT HEART CATH AND CORONARY/GRAFT ANGIOGRAPHY;  Surgeon: Burnell Blanks, MD;  Location: [facility] CV LAB;  Service: Cardiovascular;  Laterality: N/A;  . TEE WITHOUT CARDIOVERSION N/A 06/25/2015   Procedure: TRANSESOPHAGEAL ECHOCARDIOGRAM (TEE);  Surgeon: Pixie Casino, MD;  Location[facility] ENDOSCOPY;  Service: Cardiovascular;  Laterality: N/A;  . TEE WITHOUT CARDIOVERSION N/A 11/02/2016   Procedure: TRANSESOPHAGEAL ECHOCARDIOGRAM (TEE);  Surgeon: [name], MD;  Location: [facility];  Service: Cardiovascular;  Laterality: N/A;  . TEE WITHOUT CARDIOVERSION N/A 12/05/2018   Procedure: TRANSESOPHAGEAL ECHOCARDIOGRAM (TEE);  Surgeon: Sherren Mocha, MD;  Location: [facility];  Service: Open Heart Surgery;  Laterality: N/A;  . TONSILLECTOMY    . TRANSCATHETER AORTIC VALVE REPLACEMENT, TRANSFEMORAL N/A 12/05/2018   Procedure: TRANSCATHETER AORTIC VALVE REPLACEMENT, TRANSFEMORAL;  Surgeon: Sherren Mocha, MD;  Location: [facility];  Service: Open Heart Surgery;  Laterality: N/A;  . UPPER GASTROINTESTINAL ENDOSCOPY  2003   reflux  esophagitis, erosive gastropathy, duodenal ulcer        Home Medications    Prior to Admission medications   Medication Sig Start Date End Date Taking? Authorizing Provider  acetaminophen (TYLENOL) 500 MG tablet Take 1 tablet (500 mg total) by mouth 3 (three) times daily. 02/17/16   [name], MD  atorvastatin (LIPITOR) 20 MG tablet Take 1 tablet (20 mg total) by mouth daily. 08/01/18   [name], MD  BD INSULIN SYRINGE U/F 30G X 1/2" 0.5 ML MISC AS DIRECTED. 03/06/19   [name], MD  citalopram (CELEXA) 20 MG tablet Take 1 tablet (20 mg total) by mouth daily. 08/01/18   [name], MD  ELIQUIS 5  MG TABS tablet Take 1 tablet (5 mg total) by mouth 2 (two) times daily. 01/10/19   [name], MD  finasteride (PROSCAR) 5 MG tablet Take 1 tablet (5 mg total) by mouth daily. 04/03/19   [name], MD  glucose blood (ACCU-CHEK AVIVA PLUS) test strip Use as instructed to check blood sugar three times a day. Dx code E11.8 & E11.65 04/10/19   [name], MD  guaiFENesin (MUCINEX) 600 MG 12 hr tablet Take 600 mg by mouth 2 (two) times daily as needed for cough or to loosen phlegm.    [provider]  LANTUS 100 UNIT/ML injection Inject 0.2 mLs (20 Units total) into the skin at bedtime. 02/19/19   [name], MD  lisinopril (PRINIVIL,ZESTRIL) 40 MG tablet Take 1 tablet (40 mg total) by mouth daily. 12/14/18 12/09/19  Eileen Stanford, PA-C  metFORMIN (GLUCOPHAGE) 1000 MG tablet Take 1 tablet (1,000 mg total) by mouth 2 (two) times daily with a meal. 05/18/19   [name], MD  metolazone (ZAROXOLYN) 2.5 MG tablet Take 1 tablet (2.5 mg total) by mouth daily. 05/22/19 08/20/19  Lendon Colonel, NP  nitroGLYCERIN (NITROSTAT) 0.4 MG SL tablet Place 1 tablet (0.4 mg total) under the tongue every 5 (five) minutes as needed for chest pain. 03/08/18   Almyra Deforest, PA  pantoprazole (PROTONIX) 20 MG tablet Take 1 tablet (20 mg total) by mouth daily. 02/28/19   [name], MD  potassium chloride SA (K-DUR) 20 MEQ tablet Take 1 tablet (20 mEq total) every Monday, Wednesday, Friday, Saturday, and Sunday. 04/09/19   [name], MD  tamsulosin (FLOMAX) 0.4 MG CAPS capsule Take 1 capsule (0.4 mg total) by mouth daily after supper. 04/09/19   [name], MD  torsemide (DEMADEX) 20 MG tablet Take 3 tablets (60 mg total) by mouth daily. Patient taking differently: Take 60 mg by mouth daily. Takes 3 tablets in the morning and 2 tablets at night. 03/29/19   Lendon Colonel, NP  traZODone (DESYREL) 50 MG tablet Take 1 tablet (50 mg total) by mouth at bedtime. 02/28/19    [name], MD    Family History Family History  Problem Relation Age of Onset  . Hypertension Father   . Heart disease Father   . Cancer Mother        GI cancer  . Diabetes Sister   . Heart disease Sister   . Stroke Sister   . Hypertension Sister   . Cancer Brother        Lung cancer - nonsmoker  . Cancer Maternal Grandmother   . Heart attack Maternal Grandfather   . Heart attack Paternal Grandmother   . Heart attack Paternal Grandfather   . Cancer Brother        Pancreatic cancer  .  Lung disease Brother     Social History Social History   Tobacco Use  . Smoking status: Former Smoker    Packs/day: 0.50    Years: 36.00    Pack years: 18.00    Types: Cigarettes    Quit date: 07/23/2013    Years since quitting: 5.8  . Smokeless tobacco: Never Used  Substance Use Topics  . Alcohol use: No  . Drug use: No     Allergies   Gabapentin, Metoprolol, Spironolactone, Amlodipine, Other, Rosiglitazone maleate, and Tricor [fenofibrate]   Review of Systems Review of Systems  Constitutional: Negative for diaphoresis and fever.  HENT: Negative for ear pain and sore throat.   Eyes: Negative for visual disturbance.  Respiratory: Positive for shortness of breath. Negative for cough and chest tightness.   Cardiovascular: Negative for chest pain and syncope.  Gastrointestinal: Negative for abdominal pain and vomiting.  Genitourinary: Negative for difficulty urinating.  Musculoskeletal: Negative for arthralgias.  Skin: Negative for rash.  Neurological: Negative for headaches.  Psychiatric/Behavioral: Negative for agitation.  All other systems reviewed and are negative.    Physical Exam Updated Vital Signs BP 128/70   Pulse 70   Temp 98.6 F (37 C) (Oral)   Resp 17   Ht 6\' 1"  (1.854 m)   Wt 102.1 kg   SpO2 98%   BMI 29.69 kg/m   Physical Exam Vitals signs and nursing note reviewed.  Constitutional:      General: He is not in acute distress.     Appearance: He is normal weight.  HENT:     Head: Normocephalic and atraumatic.     Nose: Nose normal.  Eyes:     Conjunctiva/sclera: Conjunctivae normal.     Pupils: Pupils are equal, round, and reactive to light.  Neck:     Musculoskeletal: Normal range of motion and neck supple.  Cardiovascular:     Rate and Rhythm: Normal rate and regular rhythm.     Pulses: Normal pulses.     Heart sounds: Normal heart sounds.  Pulmonary:     Breath sounds: Decreased breath sounds present.  Chest:     Chest wall: No tenderness.  Abdominal:     General: Abdomen is flat. Bowel sounds are normal.     Tenderness: There is no abdominal tenderness. There is no guarding.  Musculoskeletal: Normal range of motion.  Skin:    General: Skin is warm and dry.     Capillary Refill: Capillary refill takes less than 2 seconds.  Neurological:     General: No focal deficit present.     Mental Status: He is alert and oriented to person, place, and time.  Psychiatric:        Mood and Affect: Mood normal.        Behavior: Behavior normal.      ED Treatments / Results  Labs (all labs ordered are listed, but only abnormal results are displayed) Results for orders placed or performed during the hospital encounter of [id number]  Basic metabolic panel  Result Value Ref Range   Sodium 133 (L) 135 - 145 mmol/L   Potassium 4.8 3.5 - 5.1 mmol/L   Chloride 95 (L) 98 - 111 mmol/L   CO2 24 22 - 32 mmol/L   Glucose, Bld 220 (H) 70 - 99 mg/dL   BUN 57 (H) 8 - 23 mg/dL   Creatinine, Ser 2.15 (H) 0.61 - 1.24 mg/dL   Calcium 9.0 8.9 - 10.3 mg/dL   GFR calc non  Af Amer 27 (L) >60 mL/min   GFR calc Af Amer 32 (L) >60 mL/min   Anion gap 14 5 - 15  CBC  Result Value Ref Range   WBC 6.1 4.0 - 10.5 K/uL   RBC 3.59 (L) 4.22 - 5.81 MIL/uL   Hemoglobin 10.5 (L) 13.0 - 17.0 g/dL   HCT 33.0 (L) 39.0 - 52.0 %   MCV 91.9 80.0 - 100.0 fL   MCH 29.2 26.0 - 34.0 pg   MCHC 31.8 30.0 - 36.0 g/dL   RDW 14.6 11.5 - 15.5 %    Platelets 135 (L) 150 - 400 K/uL   nRBC 0.0 0.0 - 0.2 %   Dg Chest 2 View  Result Date: 05/24/2019 CLINICAL DATA:  Feet edema EXAM: CHEST - 2 VIEW COMPARISON:  03/19/2019, 01/26/2019, 12/25/2018 FINDINGS: Post sternotomy changes and valve prosthesis. Similar appearance of left-sided pacing device. Right lung grossly clear. Small left pleural effusion suspected. Increased airspace disease at the left base compared to prior. Stable enlarged cardiomediastinal silhouette with aortic atherosclerosis. IMPRESSION: 1. Probable small left effusion. Increased airspace disease at the left base, suspect for acute on chronic basilar opacity, possibly atelectasis or a pneumonia Electronically Signed   By: Donavan Foil M.D.   On: 05/24/2019 17:17    EKG  EKG Interpretation  Date/Time:  Thursday May 24 2019 16:27:09 EDT Ventricular Rate:  70 PR Interval:    QRS Duration: 184 QT Interval:  492 QTC Calculation: 531 R Axis:   -116 Text Interpretation:  Ventricular-paced rhythm Abnormal ECG Confirmed by Randal Buba, Bray Vickerman ([id number]) on 05/25/2019 12:42:17 AM       Radiology Dg Chest 2 View  Result Date: 05/24/2019 CLINICAL DATA:  Feet edema EXAM: CHEST - 2 VIEW COMPARISON:  03/19/2019, 01/26/2019, 12/25/2018 FINDINGS: Post sternotomy changes and valve prosthesis. Similar appearance of left-sided pacing device. Right lung grossly clear. Small left pleural effusion suspected. Increased airspace disease at the left base compared to prior. Stable enlarged cardiomediastinal silhouette with aortic atherosclerosis. IMPRESSION: 1. Probable small left effusion. Increased airspace disease at the left base, suspect for acute on chronic basilar opacity, possibly atelectasis or a pneumonia Electronically Signed   By: Donavan Foil M.D.   On: 05/24/2019 17:17    Procedures Procedures (including critical care time)  Medications Ordered in ED Medications  sodium chloride flush (NS) 0.9 % injection 3 mL (has no  administration in time range)  ceFEPIme (MAXIPIME) 1 g in sodium chloride 0.9 % 100 mL IVPB (has no administration in time range)       Final Clinical Impressions(s) / ED Diagnoses   Plan admission     Sofie Schendel, MD 05/25/19 [id number]

## 2019-05-25 NOTE — H&P (Signed)
History and Physical    Charles [name] [id number] DOB: 06-12-1932 DOA: 05/24/2019  PCP: [name], MD  Patient coming from: Home  I have personally briefly reviewed patient's old medical records in [place]  Chief Complaint: SOB  HPI: Charles [name] is a 85 y.o. male with medical history significant of CAD s/p CABG in 2004, RAS, HTN, L CEA, HLD, DM2, PAF on eliquis, AS s/p TAVR in March 2020.  Patient presents to the ED with c/o SOB.  Onset over the past week, progressively worsening.  Associated BLE swelling.  Seen in cards clinic 8/18, given metalozone for 2 doses after this.  Peripheral edema improved somewhat but still worse than baseline.  SOB is same when sitting up as when laying down.  Put on 2L home O2 about 1 month ago.  No cough, fever, chills, CP, palpitations.   ED Course: BNP 178.5, best its been in a while.  Trop 107.  CXR suspicious for LLL PNA.  Probable small left effusion.  Creat 2.1 up from 1.7 on 8/18.  Looks like he ususally runs 1.5-1.6 or so.  WBC nl, no SIRS, and COVID neg.  Given empiric cefepime and vanc.   Review of Systems: As per HPI, otherwise all review of systems negative.  Past Medical History:  Diagnosis Date  . Abdominal aortic atherosclerosis ([place])    by xray  . Arthritis    in lower back (06/24/2015)  . Atrial flutter ([place])    notes 06/24/2015  . AV block, [place] 1 09/06/2017   Archie Endo 09/06/2017  . 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 CAD (coronary artery disease) 2004   a. s/p CABG in 2004 with LIMA-LAD, SVG-RCA, SVG-OM, and SVG-RI  . Carotid stenosis 1999   s/p L CEA  . Chronic diastolic CHF (congestive heart failure) ([place])   . Colon polyp 2005   (Dr. Tiffany Kocher)  . Compression fracture of L1 lumbar vertebra (HCC) remote  . COPD (chronic obstructive pulmonary disease) ([place]) 03/2011   by xray  . Depression    hx  . GERD (gastroesophageal reflux disease) 2003   h/o duodenal  ulcer per EGD as well as esophagitis  . History of colon polyps 2003, 2005   adenomatous Vira Agar)  . History of tobacco abuse   . HLD (hyperlipidemia)   . HTN (hypertension)   . Presence of permanent cardiac pacemaker   . Psoriasis   . Renal artery stenosis ([place]) 2004   70% bilateral, followed by cards  . 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 Severe aortic stenosis   . T2DM (type 2 diabetes mellitus) ([place]) 1995  . Urge incontinence of urine     Past Surgical History:  Procedure Laterality Date  . CARDIAC CATHETERIZATION  03/06/2003   No intervention - recommend CABG  . CARDIOVASCULAR STRESS TEST  11/2010   normal perfusion, no evidence of ischemia, EF 62% post exercise  . CARDIOVASCULAR STRESS TEST  11/28/2012   Mild diaphragmatic attenuation; cannot exclude a focal region of nontransmural inferior scar  . CARDIOVERSION N/A 06/25/2015   Procedure: CARDIOVERSION;  Surgeon: Pixie Casino, MD;  Location: [facility] ENDOSCOPY;  Service: Cardiovascular;  Laterality: N/A;  . CARDIOVERSION N/A 11/02/2016   Procedure: CARDIOVERSION;  Surgeon: [name], MD;  Location: [place];  Service: Cardiovascular;  Laterality: N/A;  . CARDIOVERSION N/A  03/20/2019   Procedure: CARDIOVERSION;  Surgeon: Jerline Pain, MD;  Location: [facility] ENDOSCOPY;  Service: Cardiovascular;  Laterality: N/A;  . CAROTID ENDARTERECTOMY Left 1999   ([place])  . CATARACT EXTRACTION W/ INTRAOCULAR LENS  IMPLANT, BILATERAL Bilateral 01/2013   [place]  . COLONOSCOPY  2003   colon polyp x3 - adenomatous Tiffany Kocher)  . COLONOSCOPY  10/08/2012   2 TA, diverticulosis, int hem, no rpt rec Tiffany Kocher)  . CORONARY ANGIOPLASTY    . CORONARY ARTERY BYPASS GRAFT  03/07/2003   4v CABG ([place]) with LIMA to LAD, vein graft to RCA, 1st obtuse marginal, and ramus intermedius  . ESOPHAGOGASTRODUODENOSCOPY  10/08/2012   nl esophagus,  duodenitis and erosive gastropathy, path - gastropathy no Hpylori, no rpt rec  . LUMBAR EPIDURAL INJECTION  03/2017   L5/S1 ([place])  . PACEMAKER IMPLANT N/A 04/21/2018   Procedure: PACEMAKER IMPLANT -- Dual Chamber;  Surgeon: Deboraha Sprang, MD;  Location: [place] CV LAB;  Service: Cardiovascular;  Laterality: N/A;  . PERIPHERAL VASCULAR CATHETERIZATION N/A 02/02/2016   Procedure: Renal Angiography;  Surgeon: [name], MD;  Location: [place] CV LAB;  Service: Cardiovascular;  Laterality: N/A;  . PERIPHERAL VASCULAR CATHETERIZATION N/A 02/02/2016   Procedure: Abdominal Aortogram;  Surgeon: [name], MD;  Location: [place] CV LAB;  Service: Cardiovascular;  Laterality: N/A;  . RENAL DOPPLER  11/29/2011   Celiac&SMA-demonstrated vessel narrowing suggestive of a greater than 50% diameter reduction. Bilateral renal arteries-demonstrated vessel narrowing of 60-99% diameter reduction. Rt Kidney-mid pole lateral simple cyst noted measuring 1.29x0.76x1.11cm and exophytic cyst outside lower pole measuring 1.23x0.96x1.31. Lft Kidney-lateral mid to lower pole simple cyst measuering-1.24x9.83x1.24  . RIGHT/LEFT HEART CATH AND CORONARY/GRAFT ANGIOGRAPHY N/A 10/20/2017   Procedure: RIGHT/LEFT HEART CATH AND CORONARY/GRAFT ANGIOGRAPHY;  Surgeon: Burnell Blanks, MD;  Location: [place] CV LAB;  Service: Cardiovascular;  Laterality: N/A;  . TEE WITHOUT CARDIOVERSION N/A 06/25/2015   Procedure: TRANSESOPHAGEAL ECHOCARDIOGRAM (TEE);  Surgeon: Pixie Casino, MD;  Location[facility] ENDOSCOPY;  Service: Cardiovascular;  Laterality: N/A;  . TEE WITHOUT CARDIOVERSION N/A 11/02/2016   Procedure: TRANSESOPHAGEAL ECHOCARDIOGRAM (TEE);  Surgeon: [name], MD;  Location: [place];  Service: Cardiovascular;  Laterality: N/A;  . TEE WITHOUT CARDIOVERSION N/A 12/05/2018   Procedure: TRANSESOPHAGEAL ECHOCARDIOGRAM (TEE);  Surgeon: Sherren Mocha, MD;  Location: [place];  Service: Open Heart  Surgery;  Laterality: N/A;  . TONSILLECTOMY    . TRANSCATHETER AORTIC VALVE REPLACEMENT, TRANSFEMORAL N/A 12/05/2018   Procedure: TRANSCATHETER AORTIC VALVE REPLACEMENT, TRANSFEMORAL;  Surgeon: Sherren Mocha, MD;  Location: [place];  Service: Open Heart Surgery;  Laterality: N/A;  . UPPER GASTROINTESTINAL ENDOSCOPY  2003   reflux esophagitis, erosive gastropathy, duodenal ulcer     reports that he quit smoking about 5 years ago. His smoking use included cigarettes. He has a 18.00 pack-year smoking history. He has never used smokeless tobacco. He reports that he does not drink alcohol or use drugs.  Allergies  Allergen Reactions  . Gabapentin Other (See Comments)    Leg pain, weakness   . Metoprolol Swelling  . Spironolactone Other (See Comments)    Painful gynecomastia  . Amlodipine Other (See Comments)    Edema  . Other Other (See Comments)    Horse serum - myalgias  . Rosiglitazone Maleate Other (See Comments)    REACTION: Did not help  . Tricor [Fenofibrate] Other (See Comments)    myalgias    Family History  Problem Relation Age of Onset  .  Hypertension Father   . Heart disease Father   . Cancer Mother        GI cancer  . Diabetes Sister   . Heart disease Sister   . Stroke Sister   . Hypertension Sister   . Cancer Brother        Lung cancer - nonsmoker  . Cancer Maternal Grandmother   . Heart attack Maternal Grandfather   . Heart attack Paternal Grandmother   . Heart attack Paternal Grandfather   . Cancer Brother        Pancreatic cancer  . Lung disease Brother      Prior to Admission medications   Medication Sig Start Date End Date Taking? Authorizing Provider  acetaminophen (TYLENOL) 500 MG tablet Take 1 tablet (500 mg total) by mouth 3 (three) times daily. Patient taking differently: Take 500 mg by mouth every 8 (eight) hours as needed for mild pain or headache.  02/17/16  Yes [name], MD  atorvastatin (LIPITOR) 20 MG tablet Take 1 tablet (20 mg  total) by mouth daily. 08/01/18  Yes [name], MD  citalopram (CELEXA) 20 MG tablet Take 1 tablet (20 mg total) by mouth daily. 08/01/18  Yes [name], MD  ELIQUIS 5 MG TABS tablet Take 1 tablet (5 mg total) by mouth 2 (two) times daily. Patient taking differently: Take 5 mg by mouth 2 (two) times daily.  01/10/19  Yes [name], MD  finasteride (PROSCAR) 5 MG tablet Take 1 tablet (5 mg total) by mouth daily. 04/03/19  Yes [name], MD  guaiFENesin (MUCINEX) 600 MG 12 hr tablet Take 600 mg by mouth 2 (two) times daily as needed for cough or to loosen phlegm.   Yes [provider]  LANTUS 100 UNIT/ML injection Inject 0.2 mLs (20 Units total) into the skin at bedtime. Patient taking differently: Inject 20 Units into the skin at bedtime.  02/19/19  Yes [name], MD  lisinopril (PRINIVIL,ZESTRIL) 40 MG tablet Take 1 tablet (40 mg total) by mouth daily. 12/14/18 12/09/19 Yes Eileen Stanford, PA-C  metFORMIN (GLUCOPHAGE) 1000 MG tablet Take 1 tablet (1,000 mg total) by mouth 2 (two) times daily with a meal. 05/18/19  Yes [name], MD  metolazone (ZAROXOLYN) 2.5 MG tablet Take 1 tablet (2.5 mg total) by mouth daily. 05/22/19 08/20/19 Yes Lendon Colonel, NP  nitroGLYCERIN (NITROSTAT) 0.4 MG SL tablet Place 1 tablet (0.4 mg total) under the tongue every 5 (five) minutes as needed for chest pain. 03/08/18  Yes Almyra Deforest, PA  pantoprazole (PROTONIX) 20 MG tablet Take 1 tablet (20 mg total) by mouth daily. 02/28/19  Yes [name], MD  potassium chloride SA (K-DUR) 20 MEQ tablet Take 1 tablet (20 mEq total) every Monday, Wednesday, Friday, Saturday, and Sunday. 04/09/19  Yes [name], MD  tamsulosin (FLOMAX) 0.4 MG CAPS capsule Take 1 capsule (0.4 mg total) by mouth daily after supper. 04/09/19  Yes [name], MD  torsemide (DEMADEX) 20 MG tablet Take 3 tablets (60 mg total) by mouth daily. Patient taking differently: Take 40-60 mg by  mouth daily. Takes 3 tablets in the morning and 2 tablets at night. 03/29/19  Yes Lendon Colonel, NP  traZODone (DESYREL) 50 MG tablet Take 1 tablet (50 mg total) by mouth at bedtime. 02/28/19  Yes [name], MD  BD INSULIN SYRINGE U/F 30G X 1/2" 0.5 ML MISC AS DIRECTED. 03/06/19   [name], MD  glucose blood (ACCU-CHEK AVIVA PLUS) test strip  Use as instructed to check blood sugar three times a day. Dx code E11.8 & E11.65 04/10/19   [name], MD    Physical Exam: Vitals:   05/25/19 0115 05/25/19 0130 05/25/19 0145 05/25/19 0222  BP: 125/67 123/71 107/60 128/73  Pulse: 69 70 64 70  Resp: (!) 24 (!) 27 (!) 23 (!) 25  Temp:      TempSrc:      SpO2: 97% 98% 91% 96%  Weight:      Height:        Constitutional: NAD, calm, comfortable Eyes: PERRL, lids and conjunctivae normal ENMT: Mucous membranes are moist. Posterior pharynx clear of any exudate or lesions.Normal dentition.  Neck: normal, supple, no masses, no thyromegaly Respiratory: diffuse wheezing Cardiovascular: Regular rate and rhythm, no murmurs / rubs / gallops. 2+ pitting edema. 2+ pedal pulses. No carotid bruits.  Abdomen: no tenderness, no masses palpated. No hepatosplenomegaly. Bowel sounds positive.  Musculoskeletal: no clubbing / cyanosis. No joint deformity upper and lower extremities. Good ROM, no contractures. Normal muscle tone.  Skin: no rashes, lesions, ulcers. No induration Neurologic: CN 2-12 grossly intact. Sensation intact, DTR normal. Strength 5/5 in all 4.  Psychiatric: Normal judgment and insight. Alert and oriented x 3. Normal mood.    Labs on Admission: I have personally reviewed following labs and imaging studies  CBC: Recent Labs  Lab 05/22/19 1126 05/24/19 1616  WBC 5.5 6.1  HGB 10.4* 10.5*  HCT 31.7* 33.0*  MCV 89 91.9  PLT 140* [id number]*   Basic Metabolic Panel: Recent Labs  Lab 05/22/19 1126 05/24/19 1616  NA 140 133*  K 5.0 4.8  CL 101 95*  CO2 22 24  GLUCOSE  141* 220*  BUN 50* 57*  CREATININE 1.75* 2.15*  CALCIUM 9.6 9.0   GFR: Estimated Creatinine Clearance: 32.1 mL/min (A) (by C-G formula based on SCr of 2.15 mg/dL (H)). Liver Function Tests: No results for input(s): AST, ALT, ALKPHOS, BILITOT, PROT, ALBUMIN in the last 168 hours. No results for input(s): LIPASE, AMYLASE in the last 168 hours. No results for input(s): AMMONIA in the last 168 hours. Coagulation Profile: No results for input(s): INR, PROTIME in the last 168 hours. Cardiac Enzymes: No results for input(s): CKTOTAL, CKMB, CKMBINDEX, TROPONINI in the last 168 hours. BNP (last 3 results) Recent Labs    01/26/19 0000  PROBNP 1,162*   HbA1C: No results for input(s): HGBA1C in the last 72 hours. CBG: No results for input(s): GLUCAP in the last 168 hours. Lipid Profile: No results for input(s): CHOL, HDL, LDLCALC, TRIG, CHOLHDL, LDLDIRECT in the last 72 hours. Thyroid Function Tests: No results for input(s): TSH, T4TOTAL, FREET4, T3FREE, THYROIDAB in the last 72 hours. Anemia Panel: No results for input(s): VITAMINB12, FOLATE, FERRITIN, TIBC, IRON, RETICCTPCT in the last 72 hours. Urine analysis:    Component Value Date/Time   COLORURINE STRAW (A) 12/01/2018 1022   APPEARANCEUR CLEAR 12/01/2018 1022   LABSPEC 1.006 12/01/2018 1022   PHURINE 5.0 12/01/2018 1022   GLUCOSEU NEGATIVE 12/01/2018 1022   HGBUR SMALL (A) 12/01/2018 1022   BILIRUBINUR NEGATIVE 12/01/2018 1022   BILIRUBINUR negative 02/15/2018 1601   KETONESUR NEGATIVE 12/01/2018 1022   PROTEINUR NEGATIVE 12/01/2018 1022   UROBILINOGEN 0.2 02/15/2018 1601   NITRITE NEGATIVE 12/01/2018 1022   LEUKOCYTESUR MODERATE (A) 12/01/2018 1022    Radiological Exams on Admission: Dg Chest 2 View  Result Date: 05/24/2019 CLINICAL DATA:  Feet edema EXAM: CHEST - 2 VIEW COMPARISON:  03/19/2019, 01/26/2019, 12/25/2018  FINDINGS: Post sternotomy changes and valve prosthesis. Similar appearance of left-sided pacing  device. Right lung grossly clear. Small left pleural effusion suspected. Increased airspace disease at the left base compared to prior. Stable enlarged cardiomediastinal silhouette with aortic atherosclerosis. IMPRESSION: 1. Probable small left effusion. Increased airspace disease at the left base, suspect for acute on chronic basilar opacity, possibly atelectasis or a pneumonia Electronically Signed   By: Donavan Foil M.D.   On: 05/24/2019 17:17    EKG: Independently reviewed.  Assessment/Plan Principal Problem:   SOB (shortness of breath) Active Problems:   Acute on chronic diastolic CHF (congestive heart failure) (HCC)   Paroxysmal atrial flutter (HCC)   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 [employer] Jude   S/P TAVR (transcatheter aortic valve replacement)   CKD (chronic kidney disease), stage II   CAP (community acquired pneumonia)   AKI (acute kidney injury) ([place])    1. SOB - 1. Secondary to either Acute on chronic diastolic CHF and/or possible CAP 2. Acute on chronic diastolic CHF - 1. Only mild exacerbation 2. 40mg  IV lasix x1 3. Then resume home meds according to cards consult note 4. Strict intake and output 5. Daily weights 3. Possible CAP - 1. Though no SIRS with this 2. COVID neg 3. PNA pathway 4. Rocephin / azithro 5. procalcitonin pending 6. Adult wheeze protocol 4. AKI on CKD stage II - 1. Holding ACEi 2. Continuing home diuretics though and lasix 40mg  IV in ED 3. Possibly secondary to [place] for 2 days after 8/18. 4. Daily BMP 5. Strict intake and output 5. PAF - 1. Continue eliquis 6. DM2 - 1. Hold home metformin 2. Lantus 15 QHS 3. Mod scale SSI AC  DVT prophylaxis: Eliquis Code Status: Full Family Communication: No family in room Disposition Plan: home after admit Consults called: Cards, see note Admission status: Place in 38    [place], [place] Hospitalists  How to contact the [facility] Attending or Consulting provider [place] or  covering provider during after hours [place], for this patient?  1. Check the care team in [facility] and look for a) attending/consulting [facility] provider listed and b) the [facility] team listed 2. Log into [email or web address]  Amion Physician Scheduling and messaging for groups and whole hospitals  On call and physician scheduling software for group practices, residents, hospitalists and other medical providers for call, clinic, rotation and shift schedules. On[employer] is a hospital-wide system for scheduling doctors and paging doctors on call. EasyPlot is for scientific plotting and data analysis.  [email or web address]  and use [place]'s universal password to access. If you do not have the password, please contact the hospital operator.  3. Locate the [facility] provider you are looking for under [facility] and page to a number that you can be directly reached. 4. If you still have difficulty reaching the provider, please page the [facility] (Director on Call) for the Hospitalists listed on amion for assistance.  05/25/2019, 3:16 AM

## 2019-05-25 NOTE — Discharge Summary (Signed)
Physician Discharge Summary  Charles [name] [id number] DOB: [birth year] DOA: 05/24/2019  PCP: [name], MD  Admit date: 05/24/2019 Discharge date: 05/25/2019  Admitted From: home  Disposition:  home   Recommendations for Outpatient Follow-up:  1. F/u on renal function   Home Health:  ordered  Discharge Condition:  stable   CODE STATUS:  Full code   Diet recommendation:  Heart healthy  Consultations:  Cardiology     Discharge Diagnoses:  Principal Problem:   Acute on chronic diastolic CHF (congestive heart failure) ([place]) Active Problems:   AKI (acute kidney injury) - CKD (chronic kidney disease), stage III   Diabetes mellitus type 2, uncontrolled, with complications (HCC)   Paroxysmal atrial flutter (HCC)   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 St Jude   S/P TAVR (transcatheter aortic valve replacement)      Brief Summary: Charles [name] is a 85 y.o. male with medical history significant of CAD s/p CABG in 2004, RAS, HTN, L CEA, HLD, DM2, PAF on eliquis, AS s/p TAVR in March 2020. He presents with dyspnea & bilateral pedal edema. CXR in ED showed a possible LLL pneumonia. Creatinine elevated from baseline at 2.1.   Hospital Course:  Acute on chronic diastolic CHF - cardiology consulted - BNP was 202.4 - given IV Lasix with resultant improvement- he can continue his home meds  AKI on CKD 3 - Cr improved slightly to 2.01- baseline is ~ 1.7- 1.8  - can be followed as outpt  Pneumonia?  - no cough, fever or leukocytosis d/c antibiotics   DM 2 - cont Metformin   Discharge Exam: Vitals:   05/25/19 0329 05/25/19 0331  BP: 133/66 133/66  Pulse: 70 69  Resp: (!) 26 20  Temp: 97.8 F (36.6 C) 97.8 F (36.6 C)  SpO2: 98% 98%   Vitals:   05/25/19 0145 05/25/19 0222 05/25/19 0329 05/25/19 0331  BP: 107/60 128/73 133/66 133/66  Pulse: 64 70 70 69  Resp: (!) 23 (!) 25 (!) 26 20  Temp:   97.8 F (36.6 C) 97.8 F (36.6 C)  TempSrc:   Oral Oral   SpO2: 91% 96% 98% 98%  Weight:   101.7 kg   Height:   6\' 1"  (1.854 m)     General: Pt is alert, awake, not in acute distress Cardiovascular: RRR, S1/S2 +, no rubs, no gallops Respiratory: CTA bilaterally, no wheezing, no rhonchi Abdominal: Soft, NT, ND, bowel sounds + Extremities: no edema, no cyanosis   Discharge Instructions  Discharge Instructions    Diet - low sodium heart healthy   Complete by: As directed    Increase activity slowly   Complete by: As directed      Allergies as of 05/25/2019      Reactions   Gabapentin Other (See Comments)   Leg pain, weakness   Metoprolol Swelling   Spironolactone Other (See Comments)   Painful gynecomastia   Amlodipine Other (See Comments)   Edema   Other Other (See Comments)   Horse serum - myalgias   Rosiglitazone Maleate Other (See Comments)   REACTION: Did not help   Tricor [fenofibrate] Other (See Comments)   myalgias      Medication List    TAKE these medications   Accu-Chek Aviva Plus test strip Generic drug: glucose blood Use as instructed to check blood sugar three times a day. Dx code E11.8 & E11.65   acetaminophen 500 MG tablet Commonly known as: TYLENOL Take 1 tablet (500 mg  total) by mouth 3 (three) times daily. What changed:   when to take this  reasons to take this   apixaban 2.5 MG Tabs tablet Commonly known as: ELIQUIS Take 1 tablet (2.5 mg total) by mouth 2 (two) times daily. Start taking on: May 26, 2019 What changed:   medication strength  See the new instructions.   atorvastatin 20 MG tablet Commonly known as: LIPITOR Take 1 tablet (20 mg total) by mouth daily.   BD Insulin Syringe U/F 30G X 1/2" 0.5 ML Misc Generic drug: Insulin Syringe-Needle U-100 AS DIRECTED.   citalopram 20 MG tablet Commonly known as: CELEXA Take 1 tablet (20 mg total) by mouth daily.   finasteride 5 MG tablet Commonly known as: PROSCAR Take 1 tablet (5 mg total) by mouth daily.   guaiFENesin 600 MG  12 hr tablet Commonly known as: MUCINEX Take 600 mg by mouth 2 (two) times daily as needed for cough or to loosen phlegm.   Lantus 100 UNIT/ML injection Generic drug: insulin glargine Inject 0.2 mLs (20 Units total) into the skin at bedtime. What changed: See the new instructions.   lisinopril 40 MG tablet Commonly known as: ZESTRIL Take 1 tablet (40 mg total) by mouth daily.   metFORMIN 1000 MG tablet Commonly known as: GLUCOPHAGE Take 1 tablet (1,000 mg total) by mouth 2 (two) times daily with a meal.   metolazone 2.5 MG tablet Commonly known as: ZAROXOLYN Take 1 tablet (2.5 mg total) by mouth daily.   nitroGLYCERIN 0.4 MG SL tablet Commonly known as: NITROSTAT Place 1 tablet (0.4 mg total) under the tongue every 5 (five) minutes as needed for chest pain.   pantoprazole 20 MG tablet Commonly known as: PROTONIX Take 1 tablet (20 mg total) by mouth daily.   potassium chloride SA 20 MEQ tablet Commonly known as: K-DUR Take 1 tablet (20 mEq total) every Monday, Wednesday, Friday, Saturday, and Sunday.   tamsulosin 0.4 MG Caps capsule Commonly known as: FLOMAX Take 1 capsule (0.4 mg total) by mouth daily after supper.   torsemide 20 MG tablet Commonly known as: DEMADEX Take 3 tablets (60 mg total) by mouth daily. What changed:   how much to take  additional instructions   traZODone 50 MG tablet Commonly known as: DESYREL Take 1 tablet (50 mg total) by mouth at bedtime.       Allergies  Allergen Reactions  . Gabapentin Other (See Comments)    Leg pain, weakness   . Metoprolol Swelling  . Spironolactone Other (See Comments)    Painful gynecomastia  . Amlodipine Other (See Comments)    Edema  . Other Other (See Comments)    Horse serum - myalgias  . Rosiglitazone Maleate Other (See Comments)    REACTION: Did not help  . Tricor [Fenofibrate] Other (See Comments)    myalgias     Procedures/Studies:    Dg Chest 2 View  Result Date:  05/24/2019 CLINICAL DATA:  Feet edema EXAM: CHEST - 2 VIEW COMPARISON:  03/19/2019, 01/26/2019, 12/25/2018 FINDINGS: Post sternotomy changes and valve prosthesis. Similar appearance of left-sided pacing device. Right lung grossly clear. Small left pleural effusion suspected. Increased airspace disease at the left base compared to prior. Stable enlarged cardiomediastinal silhouette with aortic atherosclerosis. IMPRESSION: 1. Probable small left effusion. Increased airspace disease at the left base, suspect for acute on chronic basilar opacity, possibly atelectasis or a pneumonia Electronically Signed   By: Donavan Foil M.D.   On: 05/24/2019 17:17  The results of significant diagnostics from this hospitalization (including imaging, microbiology, ancillary and laboratory) are listed below for reference.     Microbiology: Recent Results (from the past 240 hour(s))  SARS Coronavirus 2 [facility] order, Performed in [facility] hospital lab) Nasopharyngeal Nasopharyngeal Swab     Status: None   Collection Time: 05/25/19 12:11 AM   Specimen: Nasopharyngeal Swab  Result Value Ref Range Status   SARS Coronavirus 2 NEGATIVE NEGATIVE Final    Comment: (NOTE) If result is NEGATIVE SARS-CoV-2 target nucleic acids are NOT DETECTED. The SARS-CoV-2 RNA is generally detectable in upper and lower  respiratory specimens during the acute phase of infection. The lowest  concentration of SARS-CoV-2 viral copies this assay can detect is 250  copies / mL. A negative result does not preclude SARS-CoV-2 infection  and should not be used as the sole basis for treatment or other  patient management decisions.  A negative result may occur with  improper specimen collection / handling, submission of specimen other  than nasopharyngeal swab, presence of viral mutation(s) within the  areas targeted by this assay, and inadequate number of viral copies  (<250 copies / mL). A negative result must be combined with clinical   observations, patient history, and epidemiological information. If result is POSITIVE SARS-CoV-2 target nucleic acids are DETECTED. The SARS-CoV-2 RNA is generally detectable in upper and lower  respiratory specimens dur ing the acute phase of infection.  Positive  results are indicative of active infection with SARS-CoV-2.  Clinical  correlation with patient history and other diagnostic information is  necessary to determine patient infection status.  Positive results do  not rule out bacterial infection or co-infection with other viruses. If result is PRESUMPTIVE POSTIVE SARS-CoV-2 nucleic acids MAY BE PRESENT.   A presumptive positive result was obtained on the submitted specimen  and confirmed on repeat testing.  While 2019 novel coronavirus  (SARS-CoV-2) nucleic acids may be present in the submitted sample  additional confirmatory testing may be necessary for epidemiological  and / or clinical management purposes  to differentiate between  SARS-CoV-2 and other Sarbecovirus currently known to infect humans.  If clinically indicated additional testing with an alternate test  methodology [phone]) is advised. The SARS-CoV-2 RNA is generally  detectable in upper and lower respiratory sp ecimens during the acute  phase of infection. The expected result is Negative. Fact Sheet for Patients:  StrictlyIdeas.no Fact Sheet for Healthcare Providers: BankingDealers.co.za This test is not yet approved or cleared by the [place] FDA and has been authorized for detection and/or diagnosis of SARS-CoV-2 by FDA under an Emergency Use Authorization (EUA).  This EUA will remain in effect (meaning this test can be used) for the duration of the COVID-19 declaration under Section 564(b)(1) of the Act, 21 U.S.C. section 360bbb-3(b)(1), unless the authorization is terminated or revoked sooner. Performed at [facility], [place] [address]  [place], [place] [address]   Blood culture (routine x 2)     Status: None (Preliminary result)   Collection Time: 05/25/19  1:00 AM   Specimen: BLOOD LEFT HAND  Result Value Ref Range Status   Specimen Description BLOOD LEFT HAND  Final   Special Requests   Final    BOTTLES DRAWN AEROBIC AND ANAEROBIC Blood Culture results may not be optimal due to an inadequate volume of blood received in culture bottles   Culture   Final    NO GROWTH < 12 HOURS Performed at [name],  [address], [place], [place] [address]    Report Status PENDING  Incomplete  Blood culture (routine x 2)     Status: None (Preliminary result)   Collection Time: 05/25/19  1:00 AM   Specimen: BLOOD RIGHT HAND  Result Value Ref Range Status   Specimen Description BLOOD RIGHT HAND  Final   Special Requests   Final    BOTTLES DRAWN AEROBIC AND ANAEROBIC Blood Culture results may not be optimal due to an inadequate volume of blood received in culture bottles   Culture   Final    NO GROWTH < 12 HOURS Performed at [facility], [place] [address] [place], [place] [address]    Report Status PENDING  Incomplete     Labs: BNP (last 3 results) Recent Labs    03/19/19 0940 05/22/19 1126 05/24/19 2358  BNP 419.9* 202.4* 0000000*   Basic Metabolic Panel: Recent Labs  Lab 05/22/19 1126 05/24/19 1616 05/25/19 0442  NA 140 133* 136  K 5.0 4.8 4.4  CL 101 95* 98  CO2 22 24 29   GLUCOSE 141* 220* 145*  BUN 50* 57* 61*  CREATININE 1.75* 2.15* 2.01*  CALCIUM 9.6 9.0 8.9   Liver Function Tests: No results for input(s): AST, ALT, ALKPHOS, BILITOT, PROT, ALBUMIN in the last 168 hours. No results for input(s): LIPASE, AMYLASE in the last 168 hours. No results for input(s): AMMONIA in the last 168 hours. CBC: Recent Labs  Lab 05/22/19 1126 05/24/19 1616 05/25/19 0442  WBC 5.5 6.1 5.6  HGB 10.4* 10.5* 9.9*  HCT 31.7* 33.0* 30.6*  MCV 89 91.9 90.3  PLT 140* 135* 128*   Cardiac Enzymes: No results for  input(s): CKTOTAL, CKMB, CKMBINDEX, TROPONINI in the last 168 hours. BNP: Invalid input(s): POCBNP CBG: Recent Labs  Lab 05/25/19 0501 05/25/19 0831 05/25/19 1129  GLUCAP 138* 137* 214*   D-Dimer No results for input(s): DDIMER in the last 72 hours. Hgb A1c No results for input(s): HGBA1C in the last 72 hours. Lipid Profile No results for input(s): CHOL, HDL, LDLCALC, TRIG, CHOLHDL, LDLDIRECT in the last 72 hours. Thyroid function studies No results for input(s): TSH, T4TOTAL, T3FREE, THYROIDAB in the last 72 hours.  Invalid input(s): FREET3 Anemia work up No results for input(s): VITAMINB12, FOLATE, FERRITIN, TIBC, IRON, RETICCTPCT in the last 72 hours. Urinalysis    Component Value Date/Time   COLORURINE STRAW (A) 12/01/2018 1022   APPEARANCEUR CLEAR 12/01/2018 1022   LABSPEC 1.006 12/01/2018 1022   PHURINE 5.0 12/01/2018 1022   GLUCOSEU NEGATIVE 12/01/2018 1022   HGBUR SMALL (A) 12/01/2018 1022   BILIRUBINUR NEGATIVE 12/01/2018 1022   BILIRUBINUR negative 02/15/2018 1601   KETONESUR NEGATIVE 12/01/2018 1022   PROTEINUR NEGATIVE 12/01/2018 1022   UROBILINOGEN 0.2 02/15/2018 1601   NITRITE NEGATIVE 12/01/2018 1022   LEUKOCYTESUR MODERATE (A) 12/01/2018 1022   Sepsis Labs Invalid input(s): PROCALCITONIN,  WBC,  LACTICIDVEN Microbiology Recent Results (from the past 240 hour(s))  SARS Coronavirus 2 [facility] order, Performed in [facility] hospital lab) Nasopharyngeal Nasopharyngeal Swab     Status: None   Collection Time: 05/25/19 12:11 AM   Specimen: Nasopharyngeal Swab  Result Value Ref Range Status   SARS Coronavirus 2 NEGATIVE NEGATIVE Final    Comment: (NOTE) If result is NEGATIVE SARS-CoV-2 target nucleic acids are NOT DETECTED. The SARS-CoV-2 RNA is generally detectable in upper and lower  respiratory specimens during the acute phase of infection. The lowest  concentration of SARS-CoV-2 viral copies this assay  can detect is 250  copies / mL. A negative  result does not preclude SARS-CoV-2 infection  and should not be used as the sole basis for treatment or other  patient management decisions.  A negative result may occur with  improper specimen collection / handling, submission of specimen other  than nasopharyngeal swab, presence of viral mutation(s) within the  areas targeted by this assay, and inadequate number of viral copies  (<250 copies / mL). A negative result must be combined with clinical  observations, patient history, and epidemiological information. If result is POSITIVE SARS-CoV-2 target nucleic acids are DETECTED. The SARS-CoV-2 RNA is generally detectable in upper and lower  respiratory specimens dur ing the acute phase of infection.  Positive  results are indicative of active infection with SARS-CoV-2.  Clinical  correlation with patient history and other diagnostic information is  necessary to determine patient infection status.  Positive results do  not rule out bacterial infection or co-infection with other viruses. If result is PRESUMPTIVE POSTIVE SARS-CoV-2 nucleic acids MAY BE PRESENT.   A presumptive positive result was obtained on the submitted specimen  and confirmed on repeat testing.  While 2019 novel coronavirus  (SARS-CoV-2) nucleic acids may be present in the submitted sample  additional confirmatory testing may be necessary for epidemiological  and / or clinical management purposes  to differentiate between  SARS-CoV-2 and other Sarbecovirus currently known to infect humans.  If clinically indicated additional testing with an alternate test  methodology [phone]) is advised. The SARS-CoV-2 RNA is generally  detectable in upper and lower respiratory sp ecimens during the acute  phase of infection. The expected result is Negative. Fact Sheet for Patients:  StrictlyIdeas.no Fact Sheet for Healthcare Providers: BankingDealers.co.za This test is not yet  approved or cleared by the [place] FDA and has been authorized for detection and/or diagnosis of SARS-CoV-2 by FDA under an Emergency Use Authorization (EUA).  This EUA will remain in effect (meaning this test can be used) for the duration of the COVID-19 declaration under Section 564(b)(1) of the Act, 21 U.S.C. section 360bbb-3(b)(1), unless the authorization is terminated or revoked sooner. Performed at [facility], [place] [address] [place], [place] [address]   Blood culture (routine x 2)     Status: None (Preliminary result)   Collection Time: 05/25/19  1:00 AM   Specimen: BLOOD LEFT HAND  Result Value Ref Range Status   Specimen Description BLOOD LEFT HAND  Final   Special Requests   Final    BOTTLES DRAWN AEROBIC AND ANAEROBIC Blood Culture results may not be optimal due to an inadequate volume of blood received in culture bottles   Culture   Final    NO GROWTH < 12 HOURS Performed at [facility], [place] [address], [place], [place] [address]    Report Status PENDING  Incomplete  Blood culture (routine x 2)     Status: None (Preliminary result)   Collection Time: 05/25/19  1:00 AM   Specimen: BLOOD RIGHT HAND  Result Value Ref Range Status   Specimen Description BLOOD RIGHT HAND  Final   Special Requests   Final    BOTTLES DRAWN AEROBIC AND ANAEROBIC Blood Culture results may not be optimal due to an inadequate volume of blood received in culture bottles   Culture   Final    NO GROWTH < 12 HOURS Performed at [facility], [place] [address], [place], [place] [address]    Report Status  PENDING  Incomplete     Time coordinating discharge in minutes: 22  SIGNED:   Debbe Odea, MD  [facility] 05/25/2019, 2:55 PM Pager   If 7PM-7AM, please contact night-coverage [email or web address] Password [email or web address]

## 2019-05-25 NOTE — Plan of Care (Signed)
  Problem: Education: Goal: Ability to demonstrate management of disease process will improve Outcome: Progressing   Problem: Education: Goal: Knowledge of General Education information will improve Description: Including pain rating scale, medication(s)/side effects and non-pharmacologic comfort measures Outcome: Progressing   Problem: Clinical Measurements: Goal: Will remain free from infection Outcome: Progressing   Problem: Elimination: Goal: Will not experience complications related to bowel motility Outcome: Progressing   Problem: Skin Integrity: Goal: Risk for impaired skin integrity will decrease Outcome: Progressing

## 2019-05-25 NOTE — TOC Initial Note (Signed)
Transition of Care [facility]) - Initial/Assessment Note    Patient Details  Name: Charles [name] MRN: [id number] Date of Birth: October 07, 1931  [facility] CM/SW Contact:    Bethena Roys, RN Phone Number: 05/25/2019, 3:43 PM  Clinical Narrative: Pt presented for for SOB- CHF- wears 02 at 2 Liters via [employer] via [employer]. CM did call [employer] to see if the can deliver a tank to the room for travel.  In need of [place] PT/AIde Services. Patient declined [place]. Pt has used [facility] in the past and wants to use again. Referral made to [facility] and [facility] to begin within 24-48 hours post transition home. Daughter will provide transportation home. No further needs from CM at this time.                 Expected Discharge Plan: [place] Barriers to Discharge: Barriers Resolved   Patient Goals and CMS Choice Patient states their goals for this hospitalization and ongoing recovery are:: "to return home" CMS Medicare.gov Compare Post Acute Care list provided to:: Patient Choice offered to / list presented to : Patient  Expected Discharge Plan and Services Expected Discharge Plan: [place] In-house Referral: NA Discharge Planning Services: CM Consult Post Acute Care Choice: [place] arrangements for the past 2 months: Apartment Expected Discharge Date: 05/25/19                         HH Arranged: PT, Nurse's Aide HH Agency: [place] Date [facility] Agency Contacted: 05/25/19 Time HH Agency Contacted: 14 Representative spoke with at [place]: Tommi Rumps  Prior Living Arrangements/Services Living arrangements for the past 2 months: Apartment Lives with:: Self Patient language and need for interpreter reviewed:: Yes Do you feel safe going back to the place where you live?: Yes      Need for Family Participation in Patient Care: Yes (Comment) Care giver support system in place?: Yes (comment)   Criminal Activity/Legal  Involvement Pertinent to Current Situation/Hospitalization: No - Comment as needed  Activities of Daily Living Home Assistive Devices/Equipment: Walker (specify type), Oxygen, CBG Meter, Blood pressure cuff ADL Screening (condition at time of admission) Patient's cognitive ability adequate to safely complete daily activities?: Yes Is the patient deaf or have difficulty hearing?: Yes Does the patient have difficulty seeing, even when wearing glasses/contacts?: No Does the patient have difficulty concentrating, remembering, or making decisions?: No Patient able to express need for assistance with ADLs?: Yes Does the patient have difficulty dressing or bathing?: No Independently performs ADLs?: Yes (appropriate for developmental age) Does the patient have difficulty walking or climbing stairs?: Yes Weakness of Legs: Both Weakness of Arms/Hands: None  Permission Sought/Granted Permission sought to share information with : Family Supports, Chartered certified accountant granted to share information with : Yes, Verbal Permission Granted     Permission granted to share info w AGENCY: [facility]        Emotional Assessment Appearance:: Appears stated age Attitude/Demeanor/Rapport: Engaged Affect (typically observed): Accepting Orientation: : Oriented to Situation, Oriented to  Time, Oriented to Place, Oriented to Self Alcohol / Substance Use: Not Applicable Psych Involvement: No (comment)  Admission diagnosis:  Shortness of breath [R06.02] Peripheral edema [R60.9] Elevated troponin [R79.89] HCAP (healthcare-associated pneumonia) [J18.9] Patient Active Problem List   Diagnosis Date Noted  . AKI (acute kidney injury) ([place]) 05/25/2019  . Acute diastolic HF (heart failure) ([place]) 03/19/2019  .  CKD (chronic kidney disease), stage II 12/27/2018  . S/P TAVR (transcatheter aortic valve replacement)   . 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 Paroxysmal  atrial flutter ([place])   . Scrotal swelling 02/16/2018  . Coronary artery disease involving native coronary artery of native heart without angina pectoris   . Acute on chronic diastolic CHF (congestive heart failure) ([place]) 09/06/2017  . Urinary urgency 07/01/2017  . Chronic anticoagulation 11/05/2016  . Chronic radicular pain of lower back 02/17/2016  . Aortic stenosis, severe 06/04/2014  . DDD (degenerative disc disease), lumbar 10/02/2013  . Ex-smoker   . Carotid arterial disease ([place]) 08/23/2013  . MDD (major depressive disorder), recurrent episode, moderate ([place]) 09/21/2007  . OSTEOARTHRITIS 09/21/2007  . COLONIC POLYPS 01/31/2007  . Diabetes mellitus type 2, uncontrolled, with complications ([place]) [id number]  . HYPERCHOLESTEROLEMIA 01/31/2007  . ERECTILE DYSFUNCTION 01/31/2007  . Essential hypertension 01/31/2007  . GERD 01/31/2007  . PSORIASIS 01/31/2007   PCP:  [name], MD Pharmacy:   [place], [place] - [address] [place] [address] Phone: [phone] Fax: [phone]     Social Determinants of Health (SDOH) Interventions    Readmission Risk Interventions No flowsheet data found.

## 2019-05-27 DIAGNOSIS — I5033 Acute on chronic diastolic (congestive) heart failure: Secondary | ICD-10-CM | POA: Diagnosis not present

## 2019-05-28 ENCOUNTER — Telehealth: Payer: Self-pay

## 2019-05-28 ENCOUNTER — Encounter: Payer: Self-pay | Admitting: Cardiology

## 2019-05-28 NOTE — Telephone Encounter (Signed)
Called patient to complete TCM call and set up virtual appointment with PCP. No answer on home number and not able to leave a message.

## 2019-05-28 NOTE — Telephone Encounter (Signed)
Called a few times but not able to leave a message.

## 2019-05-28 NOTE — Progress Notes (Signed)
Remote pacemaker transmission.   

## 2019-05-29 NOTE — Telephone Encounter (Signed)
Transition Care Management Follow-up Telephone Call  Spoke with Tara-daughter-in-law also and she wanted to let Dr Darnell Level know that patient has not been checking his sugar as he suppose to for a long time. He is also taking insulin 20 units twice daily and has been doing this for a while and will not tell Dr Darnell Level about this-overusing insulin. Patient has a virtual visit set up on 05/30/2019   Date discharged? 05/25/2019   How have you been since you were released from the hospital? Still has some swelling in feet but much better. No new symptoms.   Do you understand why you were in the hospital? yes   Do you understand the discharge instructions? yes   Where were you discharged to? Home and has family stopping by to check on him.   Items Reviewed:  Medications reviewed: yes  Allergies reviewed: yes  Dietary changes reviewed: yes  Referrals reviewed: yes   Functional Questionnaire:   Activities of Daily Living (ADLs):   He states they are independent in the following: fixing food, dressing, toileting, hygiene, bathing. States they require assistance with the following: fixing medications to take, uses walker to ambulate.   Any transportation issues/concerns?: No, daughter in law helps patient.   Any patient concerns? Patient states [facility] nurse called to follow up after recent hospital visit and told patient that there are clinics that patients can go to and get injections for the swelling and not having to be in the hospital. Patient wanted to know if Dr Darnell Level knew anything about this?   Confirmed importance and date/time of follow-up visits scheduled yes  Provider Appointment booked with Dr Darnell Level for virtual visit on 05/30/2019  Confirmed with patient if condition begins to worsen call PCP or go to the ER.  Patient was given the office number and encouraged to call back with question or concerns.  : yes

## 2019-05-30 ENCOUNTER — Other Ambulatory Visit: Payer: Self-pay

## 2019-05-30 ENCOUNTER — Encounter: Payer: Self-pay | Admitting: Family Medicine

## 2019-05-30 ENCOUNTER — Ambulatory Visit (INDEPENDENT_AMBULATORY_CARE_PROVIDER_SITE_OTHER): Payer: Medicare Other | Admitting: Adult Health

## 2019-05-30 ENCOUNTER — Ambulatory Visit (INDEPENDENT_AMBULATORY_CARE_PROVIDER_SITE_OTHER): Payer: Medicare Other | Admitting: Family Medicine

## 2019-05-30 ENCOUNTER — Encounter: Payer: Self-pay | Admitting: Adult Health

## 2019-05-30 VITALS — BP 140/66 | HR 69 | Ht 73.0 in | Wt 218.0 lb

## 2019-05-30 VITALS — BP 104/60 | HR 69 | Temp 95.9°F | Ht 73.0 in | Wt 220.0 lb

## 2019-05-30 DIAGNOSIS — IMO0002 Reserved for concepts with insufficient information to code with codable children: Secondary | ICD-10-CM

## 2019-05-30 DIAGNOSIS — Z952 Presence of prosthetic heart valve: Secondary | ICD-10-CM

## 2019-05-30 DIAGNOSIS — N182 Chronic kidney disease, stage 2 (mild): Secondary | ICD-10-CM

## 2019-05-30 DIAGNOSIS — E118 Type 2 diabetes mellitus with unspecified complications: Secondary | ICD-10-CM

## 2019-05-30 DIAGNOSIS — I5032 Chronic diastolic (congestive) heart failure: Secondary | ICD-10-CM

## 2019-05-30 DIAGNOSIS — I4892 Unspecified atrial flutter: Secondary | ICD-10-CM

## 2019-05-30 DIAGNOSIS — I5033 Acute on chronic diastolic (congestive) heart failure: Secondary | ICD-10-CM | POA: Diagnosis not present

## 2019-05-30 DIAGNOSIS — N179 Acute kidney failure, unspecified: Secondary | ICD-10-CM | POA: Diagnosis not present

## 2019-05-30 DIAGNOSIS — I2510 Atherosclerotic heart disease of native coronary artery without angina pectoris: Secondary | ICD-10-CM

## 2019-05-30 DIAGNOSIS — Z950 Presence of cardiac pacemaker: Secondary | ICD-10-CM

## 2019-05-30 DIAGNOSIS — Z79899 Other long term (current) drug therapy: Secondary | ICD-10-CM | POA: Diagnosis not present

## 2019-05-30 DIAGNOSIS — E1165 Type 2 diabetes mellitus with hyperglycemia: Secondary | ICD-10-CM

## 2019-05-30 LAB — CULTURE, BLOOD (ROUTINE X 2)
Culture: NO GROWTH
Culture: NO GROWTH

## 2019-05-30 MED ORDER — METFORMIN HCL 500 MG PO TABS: 500.0000 mg | ORAL_TABLET | Freq: Two times a day (BID) | ORAL | 1 refills | Status: DC

## 2019-05-30 MED ORDER — INSULIN GLARGINE 100 UNIT/ML ~~LOC~~ SOLN: 25.0000 [IU] | Freq: Two times a day (BID) | SUBCUTANEOUS | 5 refills | Status: DC

## 2019-05-30 NOTE — Assessment & Plan Note (Addendum)
Will need labwork rechecked - if not done at cardiology I asked him to return next week for lab visit in office. I did go ahead and start lowering metformin dose. See below.

## 2019-05-30 NOTE — Progress Notes (Signed)
Cardiology Office Note   Date:  05/30/2019   ID:  Charles [name], DOB [birth year], MRN [id number]  PCP:  [name], MD  Cardiologist:  [facility] follow-up   History of Present Illness: Charles [name] is a 85 y.o. male who presents for ongoing assessment and management of chronic diastolic heart failure, with recent admission on 05/24/2019 for decompensated CHF, other history includes coronary artery disease status post CABG x4 in 2004 (LIMA to LAD SVG to RCA, OM branch to ramus), AS s/p TAVR 12/2018 and PPM  RAS with 70% bilateral stenosis, hypertension, hyperlipidemia, carotid artery disease, with history of atrial flutter status post TEE cardioversion remotely.  The patient was last seen in the office on 05/22/2019 which time the patient was fluid overloaded, he had gained approximately 10 pounds and had significant lower extremity edema.  We gave him 1 dose of metolazone 2.5 mg to help with diuresis and he was to continue his prior diuretic therapy.  If his weight did not decrease he was able to take a second dose.  However, follow-up labs revealed significant renal insufficiency after 1 dose of metolazone, with increase in creatinine from 1.4-1.75.  The patient had not lost any weight and continued to have edema.  He was therefore recommended to be seen at [facility] for admission and IV diuresis.  He was seen by hospitalist service, admitted for IV diuresis with Lasix 40 mg IV.  The patient was to be seen by cardiology.  ACE inhibitor was discontinued.  Also metformin was discontinued due to worsening renal function.  Eliquis was reduced to 2.5 twice daily due to age and creatinine greater than 1.5.  Chest x-ray revealed possible left lower lobe pneumonia.  He was initially treated with antibiotics but these were discontinued due to lack of fever or leukocytosis.  Discharge weight was 101.7 kg (223 lbs), down from 228 pounds from prior office visit.  The patient's daughter-in-law did  contact primary care physician reporting that the patient had not been checking his blood sugar at home for a long time and had been taking extra doses of insulin twice a day.  The patient was to be seen virtually by primary care today 05/30/2019.  Insulin dose was adjusted.  He comes today with a started feeling better.  He has lost approximately 8 pounds.  They were disappointed that he was not kept longer during hospitalization to get back down to a more dry weight.  The patient has less lower extremity edema but does have some mild dependent edema not nearly to the extent that he had when being seen last in the office.  The patient currently uses a large oxygen tank here in the office for O2 support, he is requesting a small tank to use for office appointments and running in the car.  He has a large compressor at home but nothing small to take with him when he travels.  His daughter is concerned about the frequent admissions for decompensated heart failure.  Although he is on medications he continues to have frequent decompensations.  She would like him to be seen by a heart failure specialist for further recommendations especially in light of chronic kidney disease.  There is questionable dietary compliance issues especially concerning diabetic diet.   Past Medical History:  Diagnosis Date   Abdominal aortic atherosclerosis ([place])    by xray   Arthritis    in lower back (06/24/2015)   Atrial flutter ([place])    notes 06/24/2015  AV block, [place] 1 09/06/2017   Archie Endo 09/06/2017   BRVO (branch retinal vein occlusion) 2015   bilateral [facility])   CAD (coronary artery disease) 2004   a. s/p CABG in 2004 with LIMA-LAD, SVG-RCA, SVG-OM, and SVG-RI   Carotid stenosis 1999   s/p L CEA   Chronic diastolic CHF (congestive heart failure) ([place])    Colon polyp 2005   (Dr. 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   Edwards Sapien 3 THV (size 26 mm, model # 9600TFX, serial # [id number]) via the TF approach   Severe aortic stenosis    T2DM (type 2 diabetes mellitus) ([place]) 1995   Urge incontinence of urine     Past Surgical History:  Procedure Laterality Date   CARDIAC CATHETERIZATION  03/06/2003   No intervention - recommend CABG   CARDIOVASCULAR STRESS TEST  11/2010   normal perfusion, no evidence of ischemia, EF 62% post exercise   CARDIOVASCULAR STRESS TEST  11/28/2012   Mild diaphragmatic attenuation; cannot exclude a focal region of nontransmural inferior scar   CARDIOVERSION N/A 06/25/2015   Procedure: CARDIOVERSION;  Surgeon: Pixie Casino, MD;  Location: [place];  Service: Cardiovascular;  Laterality: N/A;   CARDIOVERSION N/A 11/02/2016   Procedure: CARDIOVERSION;  Surgeon: [name], MD;  Location: [place];  Service: Cardiovascular;  Laterality: N/A;   CARDIOVERSION N/A 03/20/2019   Procedure: CARDIOVERSION;  Surgeon: Jerline Pain, MD;  Location: [place] [facility];  Service: Cardiovascular;  Laterality: N/A;   CAROTID ENDARTERECTOMY Left 1999   ([place])   CATARACT EXTRACTION W/ INTRAOCULAR LENS  IMPLANT, BILATERAL Bilateral 01/2013   [place]   COLONOSCOPY  2003   colon polyp x3 - adenomatous Tiffany Kocher)   COLONOSCOPY  10/08/2012   2 TA, diverticulosis, int hem, no rpt rec Tiffany Kocher)   CORONARY ANGIOPLASTY     CORONARY ARTERY BYPASS GRAFT  03/07/2003   4v CABG ([place]) with LIMA to LAD, vein graft to  RCA, 1st obtuse marginal, and ramus intermedius   ESOPHAGOGASTRODUODENOSCOPY  10/08/2012   nl esophagus, duodenitis and erosive gastropathy, path - gastropathy no Hpylori, no rpt rec   LUMBAR EPIDURAL INJECTION  03/2017   L5/S1 ([place])   PACEMAKER IMPLANT N/A 04/21/2018   Procedure: PACEMAKER IMPLANT -- Dual Chamber;  Surgeon: Deboraha Sprang, MD;  Location: [place] [facility];  Service: Cardiovascular;  Laterality: N/A;   PERIPHERAL VASCULAR CATHETERIZATION N/A 02/02/2016   Procedure: Renal Angiography;  Surgeon: [name], MD;  Location: [place] [facility];  Service: Cardiovascular;  Laterality: N/A;   PERIPHERAL VASCULAR CATHETERIZATION N/A 02/02/2016   Procedure: Abdominal Aortogram;  Surgeon: [name], MD;  Location: [place] [facility];  Service: Cardiovascular;  Laterality: N/A;   RENAL DOPPLER  11/29/2011   Celiac&SMA-demonstrated vessel narrowing suggestive of a greater than 50% diameter reduction. Bilateral renal arteries-demonstrated vessel narrowing of 60-99% diameter reduction.  Rt Kidney-mid pole lateral simple cyst noted measuring 1.29x0.76x1.11cm and exophytic cyst outside lower pole measuring 1.23x0.96x1.31. Lft Kidney-lateral mid to lower pole simple cyst measuering-1.24x9.83x1.24   RIGHT/LEFT HEART CATH AND CORONARY/GRAFT ANGIOGRAPHY N/A 10/20/2017   Procedure: RIGHT/LEFT HEART CATH AND CORONARY/GRAFT ANGIOGRAPHY;  Surgeon: Burnell Blanks, MD;  Location: [place] [facility];  Service: Cardiovascular;  Laterality: N/A;   TEE WITHOUT CARDIOVERSION N/A 06/25/2015   Procedure: TRANSESOPHAGEAL ECHOCARDIOGRAM (TEE);  Surgeon: Pixie Casino, MD;  Location: [facility] ENDOSCOPY;  Service: Cardiovascular;  Laterality: N/A;   TEE WITHOUT CARDIOVERSION N/A 11/02/2016   Procedure: TRANSESOPHAGEAL ECHOCARDIOGRAM (TEE);  Surgeon: [name], MD;  Location: [place];  Service: Cardiovascular;  Laterality: N/A;   TEE WITHOUT CARDIOVERSION N/A 12/05/2018   Procedure:  TRANSESOPHAGEAL ECHOCARDIOGRAM (TEE);  Surgeon: Sherren Mocha, MD;  Location: [place];  Service: Open Heart Surgery;  Laterality: N/A;   TONSILLECTOMY     TRANSCATHETER AORTIC VALVE REPLACEMENT, TRANSFEMORAL N/A 12/05/2018   Procedure: TRANSCATHETER AORTIC VALVE REPLACEMENT, TRANSFEMORAL;  Surgeon: Sherren Mocha, MD;  Location: [place];  Service: Open Heart Surgery;  Laterality: N/A;   UPPER GASTROINTESTINAL ENDOSCOPY  2003   reflux esophagitis, erosive gastropathy, duodenal ulcer     Current Outpatient Medications  Medication Sig Dispense Refill   acetaminophen (TYLENOL) 500 MG tablet Take 1 tablet (500 mg total) by mouth 3 (three) times daily. (Patient taking differently: Take 500 mg by mouth every 8 (eight) hours as needed for mild pain or headache. )     apixaban (ELIQUIS) 2.5 MG TABS tablet Take 1 tablet (2.5 mg total) by mouth 2 (two) times daily. 60 tablet 0   atorvastatin (LIPITOR) 20 MG tablet Take 1 tablet (20 mg total) by mouth daily. 90 tablet 3   BD INSULIN SYRINGE U/F 30G X 1/2" 0.5 ML MISC AS DIRECTED. 100 each 0   citalopram (CELEXA) 20 MG tablet Take 1 tablet (20 mg total) by mouth daily. 90 tablet 3   finasteride (PROSCAR) 5 MG tablet Take 1 tablet (5 mg total) by mouth daily. 30 tablet 5   glucose blood (ACCU-CHEK AVIVA PLUS) test strip Use as instructed to check blood sugar three times a day. Dx code E11.8 & E11.65 300 strip 1   guaiFENesin (MUCINEX) 600 MG 12 hr tablet Take 600 mg by mouth 2 (two) times daily as needed for cough or to loosen phlegm.     lisinopril (PRINIVIL,ZESTRIL) 40 MG tablet Take 1 tablet (40 mg total) by mouth daily. 30 tablet 11   metFORMIN (GLUCOPHAGE) 500 MG tablet Take 1 tablet (500 mg total) by mouth 2 (two) times daily with a meal. 180 tablet 1   nitroGLYCERIN (NITROSTAT) 0.4 MG SL tablet Place 1 tablet (0.4 mg total) under the tongue every 5 (five) minutes as needed for chest pain. 90 tablet 3   pantoprazole (PROTONIX) 20 MG tablet  Take 1 tablet (20 mg total) by mouth daily. 90 tablet 3   potassium chloride SA (K-DUR) 20 MEQ tablet Take 1 tablet (20 mEq total) every Monday, Wednesday, Friday, Saturday, and Sunday. 30 tablet 11   tamsulosin (FLOMAX) 0.4 MG CAPS capsule Take 1 capsule (0.4 mg total) by mouth daily after supper. 30 capsule 5   torsemide (DEMADEX) 20 MG tablet Take 3 tablets (60 mg total) by mouth daily. (Patient taking differently: Take 40-60 mg by mouth daily. Takes 3 tablets in the morning and 2 tablets at night.) 90 tablet 1   traZODone (DESYREL) 50 MG tablet Take 1 tablet (  50 mg total) by mouth at bedtime. 90 tablet 3   insulin glargine (LANTUS) 100 UNIT/ML injection Inject 0.25 mLs (25 Units total) into the skin 2 (two) times daily. 30 mL 5   No current facility-administered medications for this visit.     Allergies:   Gabapentin, Metoprolol, Spironolactone, Amlodipine, Other, Rosiglitazone maleate, and Tricor [fenofibrate]    Social History:  The patient  reports that he quit smoking about 5 years ago. His smoking use included cigarettes. He has a 18.00 pack-year smoking history. He has never used smokeless tobacco. He reports that he does not drink alcohol or use drugs.   Family History:  The patient's family history includes Cancer in his brother, brother, maternal grandmother, and mother; Diabetes in his sister; Heart attack in his maternal grandfather, paternal grandfather, and paternal grandmother; Heart disease in his father and sister; Hypertension in his father and sister; Lung disease in his brother; Stroke in his sister.    ROS: All other systems are reviewed and negative. Unless otherwise mentioned in H&P    PHYSICAL EXAM: VS:  BP 104/60    Pulse 69    Temp (!) 95.9 F (35.5 C)    Ht 6\' 1"  (1.854 m)    Wt 220 lb (99.8 kg)    SpO2 91%    BMI 29.03 kg/m  , BMI Body mass index is 29.03 kg/m. GEN: Well nourished, well developed, in no acute distress HEENT: normal Neck: no JVD, carotid  bruits, or masses Cardiac: IRRR; 1/6 systolic  murmurs, rubs, or gallops, 1+ dependent edema  Respiratory:  Clear to auscultation bilaterally, normal work of breathing, wearing oxygen via nasal cannula GI: soft, nontender, nondistended, + BS MS: no deformity or atrophy Skin: warm and dry, no rash Neuro:  Strength and sensation are intact Psych: euthymic mood, full affect   EKG: Not completed this office visit.  Recent Labs: 01/26/2019: NT-Pro BNP 1,162 03/19/2019: ALT 20; TSH 1.707 03/20/2019: Magnesium 1.7 05/24/2019: B Natriuretic Peptide 178.5 05/25/2019: BUN 61; Creatinine, Ser 2.01; Hemoglobin 9.9; Platelets 128; Potassium 4.4; Sodium 136    Lipid Panel    Component Value Date/Time   CHOL 137 02/23/2019 1104   TRIG 283.0 (H) 02/23/2019 1104   TRIG 133 09/10/2011   HDL 30.40 (L) 02/23/2019 1104   CHOLHDL 5 02/23/2019 1104   VLDL 56.6 (H) 02/23/2019 1104   LDLCALC 73 06/08/2016 1423   LDLDIRECT 75.0 02/23/2019 1104      Wt Readings from Last 3 Encounters:  05/30/19 220 lb (99.8 kg)  05/30/19 218 lb (98.9 kg)  05/25/19 224 lb 3.3 oz (101.7 kg)      Other studies Reviewed: Echocardiogram 01-20-19 1. The left ventricle has low normal systolic function, with an ejection fraction of 50-55%. The cavity size was normal. There is moderately increased left ventricular wall thickness. Left ventricular diastolic Doppler parameters are consistent with  impaired relaxation. Elevated left atrial and left ventricular end-diastolic pressures The E/e' is >20. There is abnormal septal motion consistent with RV pacemaker.  2. Moderate hypokinesis of the left ventricular, basal-mid septal wall.  3. The mitral valve is degenerative. Mild thickening of the mitral valve leaflet. Moderate calcification of the mitral valve leaflet. There is moderate mitral annular calcification present. Mild mitral valve stenosis.  4. The tricuspid valve is grossly normal.  5. A 26 an Edwards Edwards Sapien  bioprosthetic aortic valve (TAVR) valve is present in the aortic position. Procedure Date: 12/05/2018 Echo findings are consistent with mild perivalvular leak  of the aortic prosthesis.  6. Moderate calcification of the aortic valve. mild stenosis of the aortic valve.  7. Left atrial size was moderately dilated.  8. Right atrial size was moderately dilated.  9. The right ventricle has normal systolic function. The cavity was normal. There is no increase in right ventricular wall thickness. 10. When compared to the prior study: 12/06/2018: LVEF 55-60%.   ASSESSMENT AND PLAN:  1.  Chronic diastolic heart failure: The patient has had recent admission due to decompensated diastolic heart failure.  He was actually kept less than 36 hours and given IV Lasix with a good urine output losing approximately 8 pounds.  He did complain that he had to wait in the emergency room for 8 hours prior to being seen and was there without his oxygen.  His daughter is concerned about the frequency that he has to be sent to the hospital due to decompensation and is asking for a heart failure specialist to review his status and make further recommendations.  I think some of his decompensations are related to medical and dietary noncompliance however he has multiple comorbidities that can play into his decompensation as well.  I will refer her to advanced heart failure clinic for their recommendations.  He has a normal LV systolic function but has had frequent admissions for decompensation of his diastolic heart failure.  Perhaps the recommendations will help to give his daughter some additional help and reassurance of his current medication regimen and reinforcement of dietary compliance  In the interim we will order a BMET to evaluate his status post hospitalization as he did has acute kidney injury with use of metolazone that was to be taken once but they took for 2 days.  On discharge the patient's creatinine had decreased to  2.01.  Baseline creatinine 1.41-1.48.  2.  Insulin-dependent diabetes: The patient had not been taking his insulin as directed and did have a follow-up visit with his primary care physician today with changes in the doses of insulin and metformin.  3.  CAD: Status post CABG in 2004.  Due to frailty and multiple core morbidities, no ischemic testing is planned at this time.  4.  AKI: Likely related to use of metolazone for assistance in diuresis as p.o. torsemide was not keeping fluid down allowing for decompensated CHF.  Repeating BMET.  5.  Paroxysmal atrial flutter: Remains on Eliquis at reduced dose of 2.5 mg twice daily in the setting of chronic kidney disease.  No evidence of bleeding.  Heart rate is currently well controlled.  6.  Status post TAVR: Edwards Safian 3 THV. size 26 mm placed 12/05/2018.  Follow-up echoes annually.  7.  History of pacemaker in situ, Dual  chamber pacemaker followed by Dr. Caryl Comes.  Has follow-up appointment for ongoing management on June 07, 2019.  He is to keep that appointment.  8. O2 Dependence: We will see if we can get him a portable oxygen tank that will allow him to ride in a car without having to do without oxygen as she has a compressor at home.   Current medicines are reviewed at length with the patient today.    Labs/ tests ordered today include: BMET  Phill Myron. [place] Pugh, ANP, AACC   05/30/2019 3:46 PM    [facility] [place] [address] Office [phone] Fax [phone]

## 2019-05-30 NOTE — Patient Instructions (Addendum)
Due to impaired kidneys, decrease metformin to 500mg  twice daily - new dose at pharmacy. Bump up lantus by 1 unit twice daily every week to goal 25 units twice daily (reach goal over 5 weeks).  Stop increasing lantus if any low sugar readings or low sugar symptoms.  Start checking sugars in am before breakfast to get some fasting readings.  If you don't have labs done with cardiology today, come into office next week for labs.

## 2019-05-30 NOTE — Assessment & Plan Note (Addendum)
S/p IV diuresis in hospital with benefit however daughter worried he wasn't sufficiently diuresed. She has been giving him torsemide 60mg  in am and 40mg  in pm without much benefit, and he did not respond to 2 doses of metolazone last week. He does not currently have dyspnea or chest pain, just persistent pedal edema. He does have in-office cardiology evaluation later today - will await their evaluation for diuretic management. In the interim I did recommend leg elevation, and low sodium diet to try and control fluid balance.

## 2019-05-30 NOTE — Patient Instructions (Signed)
Medication Instructions:  Continue current medications  If you need a refill on your cardiac medications before your next appointment, please call your pharmacy.  Labwork: BMP HERE IN OUR OFFICE AT [facility]   You will NOT need to fast   Take the provided lab slips with you to the lab for your blood draw.   When you have your labs (blood work) drawn today and your tests are completely normal, you will receive your results only by MyChart Message (if you have MyChart) -OR-  A paper copy in the mail.  If you have any lab test that is abnormal or we need to change your treatment, we will call you to review these results.  Testing/Procedures: None Ordered  Follow-Up: You will need a follow up appointment in 4 months.  Please call our office 2 months in advance to schedule this appointment.  You may see [name], MD or one of the following Advanced Practice Providers on your designated Care Team:   Kerin Ransom, PA-C Roby Lofts, [place] . Sande Rives, PA-C     You have been referred to [place], you and your health needs are our priority.  As part of our continuing mission to provide you with exceptional heart care, we have created designated Provider Care Teams.  These Care Teams include your primary Cardiologist (physician) and Advanced Practice Providers (APPs -  Physician Assistants and Nurse Practitioners) who all work together to provide you with the care you need, when you need it.  Thank you for choosing [facility] at [facility]!!

## 2019-05-30 NOTE — Assessment & Plan Note (Addendum)
He self increased lantus to 20u BID (was prescribed 20u once daily). Discussed dangers of self tapering medications especially insulin. Endorses adequate sugar control 100-200s. With ARF will start metformin taper - decrease to 500mg  BID. If persistent renal insufficiency, will then stop metformin. In interim, reviewed slowed taper of lantus over the next 5 weeks, reviewing stopping taper if any low sugars or hypoglycemic symptoms develop. Pt and DIL agree.

## 2019-05-30 NOTE — Progress Notes (Signed)
Virtual visit completed through [employer]. Due to national recommendations of social distancing due to COVID-19, a virtual visit is felt to be most appropriate for this patient at this time. Reviewed limitations of a virtual visit.   Patient location: home, [name] DIL also present on video call Provider location: [place] at [facility], office If any vitals were documented, they were collected by patient at home unless specified below.    BP 140/66   Pulse 69   Ht 6\' 1"  (1.854 m)   Wt 218 lb (98.9 kg)   SpO2 98% Comment: 2 L  BMI 28.76 kg/m    CC: hospital f/u visit Subjective:    Patient ID: Charles [name], male    DOB: [birth year], 85 y.o.   MRN: [id number]  HPI: Charles [name] is a 85 y.o. male presenting on 05/30/2019 for Hospitalization Follow-up   Known chronic dCHF, CAD s/p 4v CABG 2004, severe AS s/p TAVR 12/2018, and carotid artery disease and atrial flutter s/p TEE DCCV then placement of PPM after AV block after TAVR. Latest catheterization 10/2017 showed moderate plum hypertension. Oxygen dependent.   Recent hospitalization for dyspnea leg edema and ARF (Cr up to 2.1). CXR showed possible LLL PNA. Treated with IV diuresis with improvement. Antibiotics were discontinued as he did not clinically have pneumonia. Blood cultures NG x2. Has been recommended low sodium diet.   DM - he has been taking lantus 20u BID although only prescribed 20u once daily. He self increased insulin about 2 months ago. Denies low sugars or hypoglycemic symptoms. Continues metformin 1000mg  bid. He does check sugars BID (not fasting however) - running 190s (peak 233, nadir 94).  Lab Results  Component Value Date   HGBA1C 11.0 (H) 02/23/2019    He tested negative for SARS-CoV-2.  Eliquis was reduced to 2.5mg  bid.  Current torsemide dose is 60mg  daily, has also been taking 40mg  in evenings due to ongoing leg swelling. Not on metolazone.  DIL worried he was sent home too early due to ongoing pedal  edema. Denies chest pain, dizziness, headaches, nausea. Chronic dyspnea. Sleeping well.  Goal weight ~215lbs  Upcoming cards appt later this afternoon in the office.   Admit date: 05/24/2019 Discharge date: 05/25/2019 TCM hospital f/u phone call completed 05/29/2019  Admitted From: home  Disposition:  home   Recommendations for Outpatient Follow-up:  1. 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 Interim medical history since our last visit reviewed. Allergies and medications reviewed and updated. Outpatient Medications Prior to Visit  Medication Sig Dispense Refill  . acetaminophen (TYLENOL) 500 MG tablet Take 1 tablet (500 mg total) by mouth 3 (three) times daily. (Patient taking differently: Take 500 mg by mouth every 8 (eight) hours as needed for mild pain or headache. )    . apixaban (ELIQUIS) 2.5 MG TABS tablet Take  1 tablet (2.5 mg total) by mouth 2 (two) times daily. 60 tablet 0  . atorvastatin (LIPITOR) 20 MG tablet Take 1 tablet (20 mg total) by mouth daily. 90 tablet 3  . BD INSULIN SYRINGE U/F 30G X 1/2" 0.5 ML MISC AS DIRECTED. 100 each 0  . citalopram (CELEXA) 20 MG tablet Take 1 tablet (20 mg total) by mouth daily. 90 tablet 3  . finasteride (PROSCAR) 5 MG tablet Take 1 tablet (5 mg total) by mouth daily. 30 tablet 5  . glucose blood  (ACCU-CHEK AVIVA PLUS) test strip Use as instructed to check blood sugar three times a day. Dx code E11.8 & E11.65 300 strip 1  . guaiFENesin (MUCINEX) 600 MG 12 hr tablet Take 600 mg by mouth 2 (two) times daily as needed for cough or to loosen phlegm.    [name] lisinopril (PRINIVIL,ZESTRIL) 40 MG tablet Take 1 tablet (40 mg total) by mouth daily. 30 tablet 11  . nitroGLYCERIN (NITROSTAT) 0.4 MG SL tablet Place 1 tablet (0.4 mg total) under the tongue every 5 (five) minutes as needed for chest pain. 90 tablet 3  . pantoprazole (PROTONIX) 20 MG tablet Take 1 tablet (20 mg total) by mouth daily. 90 tablet 3  . potassium chloride SA (K-DUR) 20 MEQ tablet Take 1 tablet (20 mEq total) every Monday, Wednesday, Friday, Saturday, and Sunday. 30 tablet 11  . tamsulosin (FLOMAX) 0.4 MG CAPS capsule Take 1 capsule (0.4 mg total) by mouth daily after supper. 30 capsule 5  . torsemide (DEMADEX) 20 MG tablet Take 3 tablets (60 mg total) by mouth daily. (Patient taking differently: Take 40-60 mg by mouth daily. Takes 3 tablets in the morning and 2 tablets at night.) 90 tablet 1  . traZODone (DESYREL) 50 MG tablet Take 1 tablet (50 mg total) by mouth at bedtime. 90 tablet 3  . LANTUS 100 UNIT/ML injection Inject 0.2 mLs (20 Units total) into the skin at bedtime. (Patient taking differently: Inject 20 Units into the skin at bedtime. ) 10 mL 2  . metFORMIN (GLUCOPHAGE) 1000 MG tablet Take 1 tablet (1,000 mg total) by mouth 2 (two) times daily with a meal. 180 tablet 0  . metolazone (ZAROXOLYN) 2.5 MG tablet Take 1 tablet (2.5 mg total) by mouth daily. 2 tablet 0   No facility-administered medications prior to visit.      Per HPI unless specifically indicated in ROS section below Review of Systems Objective:    BP 140/66   Pulse 69   Ht 6\' 1"  (1.854 m)   Wt 218 lb (98.9 kg)   SpO2 98% Comment: 2 L  BMI 28.76 kg/m   Wt Readings from Last 3 Encounters:  05/30/19 220 lb (99.8 kg)  05/30/19 218 lb (98.9 kg)   05/25/19 224 lb 3.3 oz (101.7 kg)     Physical exam: Gen: alert, NAD, O2 in place through nasal cannula Pulm: speaks in complete sentences without increased work of breathing Psych: normal mood, normal thought content      Assessment & Plan:   Problem List Items Addressed This Visit    S/P TAVR (transcatheter aortic valve replacement) (Chronic)   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 St Jude (Chronic)   Diabetes mellitus type 2, uncontrolled, with complications ([place])    He self increased lantus to 20u BID (was prescribed 20u once daily). Discussed dangers of self tapering medications especially insulin. Endorses adequate sugar control 100-200s. With ARF will start metformin taper - decrease to 500mg  BID.  If persistent renal insufficiency, will then stop metformin. In interim, reviewed slowed taper of lantus over the next 5 weeks, reviewing stopping taper if any low sugars or hypoglycemic symptoms develop. Pt and DIL agree.       Relevant Medications   metFORMIN (GLUCOPHAGE) 500 MG tablet   insulin glargine (LANTUS) 100 UNIT/ML injection   CKD (chronic kidney disease), stage II (Chronic)    Possible progression noted. Will need this rechecked soon.       AKI (acute kidney injury) ([place])    Will need labwork rechecked - if not done at cardiology I asked him to return next week for lab visit in office. I did go ahead and start lowering metformin dose. See below.       Acute on chronic diastolic CHF (congestive heart failure) ([place]) - Primary    S/p IV diuresis in hospital with benefit however daughter worried he wasn't sufficiently diuresed. She has been giving him torsemide 60mg  in am and 40mg  in pm without much benefit, and he did not respond to 2 doses of metolazone last week. He does not currently have dyspnea or chest pain, just persistent pedal edema. He does have in-office cardiology evaluation later today - will await their evaluation for diuretic management. In the interim I  did recommend leg elevation, and low sodium diet to try and control fluid balance.           Meds ordered this encounter  Medications  . metFORMIN (GLUCOPHAGE) 500 MG tablet    Sig: Take 1 tablet (500 mg total) by mouth 2 (two) times daily with a meal.    Dispense:  180 tablet    Refill:  1    Note new sig - hold until next refill due  . insulin glargine (LANTUS) 100 UNIT/ML injection    Sig: Inject 0.25 mLs (25 Units total) into the skin 2 (two) times daily.    Dispense:  30 mL    Refill:  5   No orders of the defined types were placed in this encounter.   I discussed the assessment and treatment plan with the patient. The patient was provided an opportunity to ask questions and all were answered. The patient agreed with the plan and demonstrated an understanding of the instructions. The patient was advised to call back or seek an in-person evaluation if the symptoms worsen or if the condition fails to improve as anticipated.   Patient instruction: Due to impaired kidneys, decrease metformin to 500mg  twice daily - new dose at pharmacy. Bump up lantus by 1 unit twice daily every week to goal 25 units twice daily (reach goal over 5 weeks).  Stop increasing lantus if any low sugar readings or low sugar symptoms.  Start checking sugars in am before breakfast to get some fasting readings.  If you don't have labs done with cardiology today, come into office next week for labs.   Follow up plan: No follow-ups on file.  [name], MD

## 2019-05-30 NOTE — Assessment & Plan Note (Signed)
Possible progression noted. Will need this rechecked soon.

## 2019-05-31 LAB — BASIC METABOLIC PANEL
BUN/Creatinine Ratio: 30 — ABNORMAL HIGH (ref 10–24)
BUN: 59 mg/dL — ABNORMAL HIGH (ref 8–27)
CO2: 24 mmol/L (ref 20–29)
Calcium: 9.8 mg/dL (ref 8.6–10.2)
Chloride: 99 mmol/L (ref 96–106)
Creatinine, Ser: 1.99 mg/dL — ABNORMAL HIGH (ref 0.76–1.27)
GFR calc Af Amer: 35 mL/min/{1.73_m2} — ABNORMAL LOW (ref >59)
GFR calc non Af Amer: 30 mL/min/{1.73_m2} — ABNORMAL LOW (ref >59)
Glucose: 91 mg/dL (ref 65–99)
Potassium: 4.8 mmol/L (ref 3.5–5.2)
Sodium: 142 mmol/L (ref 134–144)

## 2019-06-06 NOTE — Telephone Encounter (Addendum)
Left message on all numbers listed for the pts daughter in law and son... to call us back.. I spoke with [place] at the [facility] and she is going to talk with Nira Conn about when they can work the pt in to be seen.

## 2019-06-06 NOTE — Telephone Encounter (Signed)
Pt's daughter-in-law called back to report the pt does have some more swelling, but just in his feet so far and all though his abdomen does not appear distended he says it feels tight. She says he does not appear uncomfortable since he is on home O2... but she just wants [place] to know so it does not continue to worsen and he ends up back in the hospital.   Advised her that I am going to forward this new note to Bunnie Domino NP for review and that I am waiting for the heart failure clinic to let us know if they will be able to get him in, in the next few days.   She says she will continue to monitor and will let us know if anything worsens prior to [place] calling her back. His weight is up about 7 lbs since last week.   [name] [phone].

## 2019-06-07 ENCOUNTER — Other Ambulatory Visit: Payer: Self-pay

## 2019-06-07 ENCOUNTER — Telehealth (INDEPENDENT_AMBULATORY_CARE_PROVIDER_SITE_OTHER): Payer: Medicare Other | Admitting: Internal Medicine

## 2019-06-07 ENCOUNTER — Encounter: Payer: Self-pay | Admitting: Internal Medicine

## 2019-06-07 VITALS — BP 121/68 | HR 69 | Wt 223.5 lb

## 2019-06-07 DIAGNOSIS — I4892 Unspecified atrial flutter: Secondary | ICD-10-CM

## 2019-06-07 DIAGNOSIS — I441 Atrioventricular block, second degree: Secondary | ICD-10-CM

## 2019-06-07 DIAGNOSIS — I5033 Acute on chronic diastolic (congestive) heart failure: Secondary | ICD-10-CM

## 2019-06-07 DIAGNOSIS — Z950 Presence of cardiac pacemaker: Secondary | ICD-10-CM

## 2019-06-07 DIAGNOSIS — N182 Chronic kidney disease, stage 2 (mild): Secondary | ICD-10-CM

## 2019-06-07 NOTE — Progress Notes (Signed)
Electrophysiology TeleHealth Note   Due to national recommendations of social distancing due to COVID 19, an audio/video telehealth visit is felt to be most appropriate for this patient at this time.  See MyChart message from today for the patient's consent to telehealth for [facility].   Date:  06/07/2019   ID:  Charles [name], DOB 1931-11-17, MRN [id number]  Location: patient's home  Provider location: [address] [place]  Evaluation Performed: Follow-up visit  PCP:  [name], MD  Cardiologist:  Derma Electrophysiologist:  SK   Chief Complaint:  Heart failure   History of Present Illness:    Charles [name] is a 85 y.o. male who presents via audio/video conferencing for a telehealth visit today.  Since last being seen in our clinic for atrial flutter in setting of ischemic cardiomyopathy and pacemaker complicated by anemia  the patient reports Hospitalized 8/20 for A/C CHF cx by increased Cr and possible pneumonia  Discharged with persistent edema and now on oxygen  Diuretic doses were increased yday b/c of reaccumulating edema Fluid intake replete -- maybe 2-3L /day per patient     He was cardioverted 6/20  Held for about 6-7 weeks   DATE TEST EF   1/18 Echo 30-35%   1/19 LHC  Patent LIMA/ SVG 3/3  6/19 Echo 60-65% LVH-severe AS Severe/ MS mild/LAE mod  3/20 Echo  50-55% E/E'>20 Mod BAE    Date Cr K Hgb  6/20 1.48 3.9 10.7<<12.1   8/20 1.99 4.8 9.9     The patient denies symptoms of fevers, chills, cough, or new SOB worrisome for COVID 19.    Past Medical History:  Diagnosis Date  . Abdominal aortic atherosclerosis ([place])    by xray  . Arthritis    in lower back (06/24/2015)  . Atrial flutter ([place])    notes 06/24/2015  . AV block, [place] 1 09/06/2017   Archie Endo 09/06/2017  . 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 CAD (coronary artery disease) 2004   a. s/p CABG in 2004 with LIMA-LAD,  SVG-RCA, SVG-OM, and SVG-RI  . Carotid stenosis 1999   s/p L CEA  . Chronic diastolic CHF (congestive heart failure) ([place])   . Colon polyp 2005   (Dr. Tiffany Kocher)  . Compression fracture of L1 lumbar vertebra (HCC) remote  . COPD (chronic obstructive pulmonary disease) ([place]) 03/2011   by xray  . Depression    hx  . GERD (gastroesophageal reflux disease) 2003   h/o duodenal ulcer per EGD as well as esophagitis  . History of colon polyps 2003, 2005   adenomatous Vira Agar)  . History of tobacco abuse   . HLD (hyperlipidemia)   . HTN (hypertension)   . Presence of permanent cardiac pacemaker   . Psoriasis   . Renal artery stenosis ([place]) 2004   70% bilateral, followed by cards  . 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 Severe aortic stenosis   . T2DM (type 2 diabetes mellitus) ([place]) 1995  . Urge incontinence of urine     Past Surgical History:  Procedure Laterality Date  . CARDIAC CATHETERIZATION  03/06/2003   No intervention - recommend CABG  . CARDIOVASCULAR STRESS TEST  11/2010   normal perfusion, no evidence of ischemia, EF 62% post exercise  . CARDIOVASCULAR STRESS TEST  11/28/2012  Mild diaphragmatic attenuation; cannot exclude a focal region of nontransmural inferior scar  . CARDIOVERSION N/A 06/25/2015   Procedure: CARDIOVERSION;  Surgeon: Pixie Casino, MD;  [facility] ENDOSCOPY;  Service: Cardiovascular;  Laterality: N/A;  . CARDIOVERSION N/A 11/02/2016   Procedure: CARDIOVERSION;  Surgeon: [name], MD;  Location: [place];  Service: Cardiovascular;  Laterality: N/A;  . CARDIOVERSION N/A 03/20/2019   Procedure: CARDIOVERSION;  Surgeon: Jerline Pain, MD;  Location: [place];  Service: Cardiovascular;  Laterality: N/A;  . CAROTID ENDARTERECTOMY Left 1999   ([place])  . CATARACT EXTRACTION W/ INTRAOCULAR LENS  IMPLANT, BILATERAL Bilateral 01/2013    [place]  . COLONOSCOPY  2003   colon polyp x3 - adenomatous Tiffany Kocher)  . COLONOSCOPY  10/08/2012   2 TA, diverticulosis, int hem, no rpt rec Tiffany Kocher)  . CORONARY ANGIOPLASTY    . CORONARY ARTERY BYPASS GRAFT  03/07/2003   4v CABG ([place]) with LIMA to LAD, vein graft to RCA, 1st obtuse marginal, and ramus intermedius  . ESOPHAGOGASTRODUODENOSCOPY  10/08/2012   nl esophagus, duodenitis and erosive gastropathy, path - gastropathy no Hpylori, no rpt rec  . LUMBAR EPIDURAL INJECTION  03/2017   L5/S1 ([place])  . PACEMAKER IMPLANT N/A 04/21/2018   Procedure: PACEMAKER IMPLANT -- Dual Chamber;  Surgeon: Deboraha Sprang, MD;  Location: [place] CV LAB;  Service: Cardiovascular;  Laterality: N/A;  . PERIPHERAL VASCULAR CATHETERIZATION N/A 02/02/2016   Procedure: Renal Angiography;  Surgeon: [name], MD;  Location: [place] CV LAB;  Service: Cardiovascular;  Laterality: N/A;  . PERIPHERAL VASCULAR CATHETERIZATION N/A 02/02/2016   Procedure: Abdominal Aortogram;  Surgeon: [name], MD;  Location: [place] CV LAB;  Service: Cardiovascular;  Laterality: N/A;  . RENAL DOPPLER  11/29/2011   Celiac&SMA-demonstrated vessel narrowing suggestive of a greater than 50% diameter reduction. Bilateral renal arteries-demonstrated vessel narrowing of 60-99% diameter reduction. Rt Kidney-mid pole lateral simple cyst noted measuring 1.29x0.76x1.11cm and exophytic cyst outside lower pole measuring 1.23x0.96x1.31. Lft Kidney-lateral mid to lower pole simple cyst measuering-1.24x9.83x1.24  . RIGHT/LEFT HEART CATH AND CORONARY/GRAFT ANGIOGRAPHY N/A 10/20/2017   Procedure: RIGHT/LEFT HEART CATH AND CORONARY/GRAFT ANGIOGRAPHY;  Surgeon: Burnell Blanks, MD;  Location: [place] CV LAB;  Service: Cardiovascular;  Laterality: N/A;  . TEE WITHOUT CARDIOVERSION N/A 06/25/2015   Procedure: TRANSESOPHAGEAL ECHOCARDIOGRAM (TEE);  Surgeon: Pixie Casino, MD;  Location[facility] END[facility]COPY;  Service: Cardiovascular;   Laterality: N/A;  . TEE WITHOUT CARDIOVERSION N/A 11/02/2016   Procedure: TRANSESOPHAGEAL ECHOCARDIOGRAM (TEE);  Surgeon: [name], MD;  Location: [place];  Service: Cardiovascular;  Laterality: N/A;  . TEE WITHOUT CARDIOVERSION N/A 12/05/2018   Procedure: TRANSESOPHAGEAL ECHOCARDIOGRAM (TEE);  Surgeon: Sherren Mocha, MD;  Location: [place];  Service: Open Heart Surgery;  Laterality: N/A;  . TONSILLECTOMY    . TRANSCATHETER AORTIC VALVE REPLACEMENT, TRANSFEMORAL N/A 12/05/2018   Procedure: TRANSCATHETER AORTIC VALVE REPLACEMENT, TRANSFEMORAL;  Surgeon: Sherren Mocha, MD;  Location: [place];  Service: Open Heart Surgery;  Laterality: N/A;  . UPPER GASTROINTESTINAL ENDOSCOPY  2003   reflux esophagitis, erosive gastropathy, duodenal ulcer    Current Outpatient Medications  Medication Sig Dispense Refill  . acetaminophen (TYLENOL) 500 MG tablet Take 1 tablet (500 mg total) by mouth 3 (three) times daily. (Patient taking differently: Take 500 mg by mouth every 8 (eight) hours as needed for mild pain or headache. )    . apixaban (ELIQUIS) 2.5 MG TABS tablet Take 1 tablet (2.5 mg total) by mouth 2 (two)  times daily. 60 tablet 0  . atorvastatin (LIPITOR) 20 MG tablet Take 1 tablet (20 mg total) by mouth daily. 90 tablet 3  . BD INSULIN SYRINGE U/F 30G X 1/2" 0.5 ML MISC AS DIRECTED. 100 each 0  . citalopram (CELEXA) 20 MG tablet Take 1 tablet (20 mg total) by mouth daily. 90 tablet 3  . finasteride (PROSCAR) 5 MG tablet Take 1 tablet (5 mg total) by mouth daily. 30 tablet 5  . glucose blood (ACCU-CHEK AVIVA PLUS) test strip Use as instructed to check blood sugar three times a day. Dx code E11.8 & E11.65 300 strip 1  . guaiFENesin (MUCINEX) 600 MG 12 hr tablet Take 600 mg by mouth 2 (two) times daily as needed for cough or to loosen phlegm.    . insulin glargine (LANTUS) 100 UNIT/ML injection Inject 0.25 mLs (25 Units total) into the skin 2 (two) times daily. 30 mL 5  . lisinopril  (PRINIVIL,ZESTRIL) 40 MG tablet Take 1 tablet (40 mg total) by mouth daily. 30 tablet 11  . metFORMIN (GLUCOPHAGE) 500 MG tablet Take 1 tablet (500 mg total) by mouth 2 (two) times daily with a meal. 180 tablet 1  . nitroGLYCERIN (NITROSTAT) 0.4 MG SL tablet Place 1 tablet (0.4 mg total) under the tongue every 5 (five) minutes as needed for chest pain. 90 tablet 3  . pantoprazole (PROTONIX) 20 MG tablet Take 1 tablet (20 mg total) by mouth daily. 90 tablet 3  . potassium chloride SA (K-DUR) 20 MEQ tablet Take 1 tablet (20 mEq total) every Monday, Wednesday, Friday, Saturday, and Sunday. 30 tablet 11  . tamsulosin (FLOMAX) 0.4 MG CAPS capsule Take 1 capsule (0.4 mg total) by mouth daily after supper. 30 capsule 5  . traZODone (DESYREL) 50 MG tablet Take 1 tablet (50 mg total) by mouth at bedtime. 90 tablet 3  . torsemide (DEMADEX) 20 MG tablet Take 3 tablets (60 mg total) by mouth daily. (Patient taking differently: Take 40-60 mg by mouth daily. Takes 3 tablets in the morning and 2 tablets at night.) 90 tablet 1   No current facility-administered medications for this visit.     Allergies:   Gabapentin, Metoprolol, Spironolactone, Amlodipine, Other, Rosiglitazone maleate, and Tricor [fenofibrate]   Social History:  The patient  reports that he quit smoking about 5 years ago. His smoking use included cigarettes. He has a 18.00 pack-year smoking history. He has never used smokeless tobacco. He reports that he does not drink alcohol or use drugs.   Family History:  The patient's   family history includes Cancer in his brother, brother, maternal grandmother, and mother; Diabetes in his sister; Heart attack in his maternal grandfather, paternal grandfather, and paternal grandmother; Heart disease in his father and sister; Hypertension in his father and sister; Lung disease in his brother; Stroke in his sister.   ROS:  Please see the history of present illness.   All other systems are personally reviewed  and negative.    Exam:    Vital Signs:  BP 121/68 (BP Location: Left Arm, Patient Position: Sitting, Cuff Size: Normal)   Pulse 69   Wt 223 lb 8 oz (101.4 kg)   SpO2 95%   BMI 29.49 kg/m  *   Well appearing, alert and conversant, regular work of breathing,  good skin color wearing O2 Eyes- anicteric, neuro- grossly intact, skin- no apparent rash or lesions or cyanosis, mouth- oral mucosa is pink   Labs/Other Tests and Data Reviewed:  Recent Labs: 01/26/2019: NT-Pro BNP 1,162 03/19/2019: ALT 20; TSH 1.707 03/20/2019: Magnesium 1.7 05/24/2019: B Natriuretic Peptide 178.5 05/25/2019: Hemoglobin 9.9; Platelets 128 05/30/2019: BUN 59; Creatinine, Ser 1.99; Potassium 4.8; Sodium 142   Wt Readings from Last 3 Encounters:  06/07/19 223 lb 8 oz (101.4 kg)  05/30/19 220 lb (99.8 kg)  05/30/19 218 lb (98.9 kg)     Other studies personally reviewed: Additional studies/ records that were reviewed today include: As above   *  *  Last device remote is reviewed from [facility] PDF dated 05/18/19* which reveals normal device function,   arrhythmias - atrial tach/flutter persistent since early Aug    ASSESSMENT & PLAN:    High-grade heart block  Aortic stenosis  Ischemic heart disease with prior bypass  Pacemaker-St. Jude  Cardiomyopathy-mild   Anemia  Atrial tach/flutter  CHF A/C diastolic  REnal dysfunction grade 3   Pt is struggling with reuccrent CHF  We have discussed the physiology of heart failure including the importance of salt restriction and fluid restriction and have reviewed sources of dietary salt and water.  Also his atrial arrhythmia may be contributing to the recurrence  Hence would like to begin on amiodarone, ( family askded that I clear with Dr Logan Bores) and then undertake DCCV again and hope that he retains NSR longer  We will need an interrogation now to confirm the persistence of the atrial flutter    COVID 19 screen The patient denies symptoms of  COVID 19 at this time.  The importance of social distancing was discussed today.  Follow-up:  53m Next remote: ASAP  Current medicines are reviewed at length with the patient today.   The patient does not have concerns regarding his medicines.  The following changes were made today:   Plan amiodarone after I discuss with [facility]  Labs/ tests ordered today include: none No orders of the defined types were placed in this encounter.   Future tests ( post COVID )     Patient Risk:  after full review of this patients clinical status, I feel that they are at moderate risk at this time.  Today, I have spent 13 minutes with the patient with telehealth technology discussing the above.  We spent more than 50% of our >25 min visit in face to face counseling regarding the above  Signed, [name], MD  06/07/2019 2:32 PM     [place] [address] [place] [address] [phone] (office) [phone] (fax)

## 2019-06-07 NOTE — Patient Instructions (Signed)
Medication Instructions:  Your physician recommends that you continue on your current medications as directed. Please refer to the Current Medication list given to you today.  If you need a refill on your cardiac medications before your next appointment, please call your pharmacy.   Lab work: none If you have labs (blood work) drawn today and your tests are completely normal, you will receive your results only by: [name] MyChart Message (if you have MyChart) OR . A paper copy in the mail If you have any lab test that is abnormal or we need to change your treatment, we will call you to review the results.  Testing/Procedures: none  Follow-Up: At [facility], you and your health needs are our priority.  As part of our continuing mission to provide you with exceptional heart care, we have created designated Provider Care Teams.  These Care Teams include your primary Cardiologist (physician) and Advanced Practice Providers (APPs -  Physician Assistants and Nurse Practitioners) who all work together to provide you with the care you need, when you need it. You will need a follow up appointment in 12 months.  Please call our office 2 months in advance to schedule this appointment.  You may see Dr. Caryl Comes or one of the following Advanced Practice Providers on your designated Care Team:   Chanetta Marshall, NP . Tommye Standard, PA-C  Any Other Special Instructions Will Be Listed Below (If Applicable). ASAP DEVICE CHECK

## 2019-06-08 ENCOUNTER — Telehealth: Payer: Self-pay | Admitting: Cardiology

## 2019-06-08 NOTE — Telephone Encounter (Signed)
Spoke w/ pt and attempted to help him send a manual transmission w/ his home monitor. Transmission received.

## 2019-06-13 NOTE — Progress Notes (Signed)
Transmission received on 06/08/19. Persistent A-flutter noted, AMS mode DDI. Routed to Dr. Caryl Comes for review.

## 2019-06-18 NOTE — Progress Notes (Signed)
L  Can we make sure he got started on amio and then he wojld need to be scheduled for DCCV You can tell him that [place] approved the choice Thanks DK

## 2019-06-22 ENCOUNTER — Encounter: Payer: Self-pay | Admitting: Family Medicine

## 2019-06-22 ENCOUNTER — Telehealth: Payer: Self-pay | Admitting: *Deleted

## 2019-06-22 MED ORDER — AMIODARONE HCL 200 MG PO TABS: ORAL_TABLET | ORAL | 3 refills | Status: DC

## 2019-06-22 NOTE — Telephone Encounter (Signed)
Can one of you call the patient's daughter in law and schedule an in office appointment 10/8 at 8:40 or 11:40 am please with Dr. Caryl Comes.  Thanks!

## 2019-06-22 NOTE — Progress Notes (Signed)
400 bid x 2 weeks and then 400 qd x 2 weeks Getting him cardioverted n the next week or so would be great  Lorren Thx SK

## 2019-06-22 NOTE — Addendum Note (Signed)
Addended by: Dollene Primrose on: 06/22/2019 11:29 AM   Modules accepted: Orders

## 2019-06-22 NOTE — Telephone Encounter (Signed)
-----   Message from Dollene Primrose, RN sent at 06/22/2019 11:29 AM EDT ----- Regarding: [place] follow up Hi Heather  This pt wants to start seeing Dr. Caryl Comes in [place]. He has a DCCV on Thur 9/24 and will need a follow up from that. I just started him on Amio so he will probably need Amnio labs unless he discontinues it at that time.  Please call his daughter in law bc she is his ride and ears :) Her name is [name].  Thanks :)  Lorren

## 2019-06-25 ENCOUNTER — Other Ambulatory Visit: Payer: Self-pay

## 2019-06-25 ENCOUNTER — Other Ambulatory Visit
Admission: RE | Admit: 2019-06-25 | Discharge: 2019-06-25 | Disposition: A | Discharge status: Home / Self Care | Payer: Medicare Other | Source: Ambulatory Visit | Attending: Internal Medicine | Admitting: Internal Medicine

## 2019-06-25 ENCOUNTER — Other Ambulatory Visit
Admission: RE | Admit: 2019-06-25 | Discharge: 2019-06-25 | Disposition: A | Discharge status: Home / Self Care | Payer: Medicare Other | Source: Ambulatory Visit | Attending: Cardiology | Admitting: Cardiology

## 2019-06-25 DIAGNOSIS — Z01812 Encounter for preprocedural laboratory examination: Secondary | ICD-10-CM | POA: Diagnosis not present

## 2019-06-25 DIAGNOSIS — Z20828 Contact with and (suspected) exposure to other viral communicable diseases: Secondary | ICD-10-CM | POA: Insufficient documentation

## 2019-06-25 DIAGNOSIS — I4892 Unspecified atrial flutter: Secondary | ICD-10-CM | POA: Diagnosis not present

## 2019-06-25 LAB — BASIC METABOLIC PANEL
Anion gap: 12 (ref 5–15)
BUN: 56 mg/dL — ABNORMAL HIGH (ref 8–23)
CO2: 25 mmol/L (ref 22–32)
Calcium: 9.3 mg/dL (ref 8.9–10.3)
Chloride: 102 mmol/L (ref 98–111)
Creatinine, Ser: 2.21 mg/dL — ABNORMAL HIGH (ref 0.61–1.24)
GFR calc Af Amer: 31 mL/min — ABNORMAL LOW (ref >60)
GFR calc non Af Amer: 26 mL/min — ABNORMAL LOW (ref >60)
Glucose, Bld: 133 mg/dL — ABNORMAL HIGH (ref 70–99)
Potassium: 4.6 mmol/L (ref 3.5–5.1)
Sodium: 139 mmol/L (ref 135–145)

## 2019-06-25 LAB — CBC
HCT: 32 % — ABNORMAL LOW (ref 39.0–52.0)
Hemoglobin: 10.1 g/dL — ABNORMAL LOW (ref 13.0–17.0)
MCH: 29.4 pg (ref 26.0–34.0)
MCHC: 31.6 g/dL (ref 30.0–36.0)
MCV: 93 fL (ref 80.0–100.0)
Platelets: 142 10*3/uL — ABNORMAL LOW (ref 150–400)
RBC: 3.44 MIL/uL — ABNORMAL LOW (ref 4.22–5.81)
RDW: 14.2 % (ref 11.5–15.5)
WBC: 6.6 10*3/uL (ref 4.0–10.5)
nRBC: 0 % (ref 0.0–0.2)

## 2019-06-25 NOTE — Telephone Encounter (Signed)
Called and left [name] a voicemail to callback

## 2019-06-26 LAB — SARS CORONAVIRUS 2 (TAT 6-24 HRS): SARS Coronavirus 2: NEGATIVE

## 2019-06-27 DIAGNOSIS — I5033 Acute on chronic diastolic (congestive) heart failure: Secondary | ICD-10-CM | POA: Diagnosis not present

## 2019-06-28 ENCOUNTER — Other Ambulatory Visit: Payer: Self-pay

## 2019-06-28 ENCOUNTER — Ambulatory Visit (HOSPITAL_COMMUNITY)
Admission: RE | Admit: 2019-06-28 | Discharge: 2019-06-28 | Disposition: A | Discharge status: Home / Self Care | Payer: Medicare Other | Attending: Cardiology | Admitting: Cardiology

## 2019-06-28 ENCOUNTER — Encounter (HOSPITAL_COMMUNITY)
Admission: RE | Disposition: A | Discharge status: Home / Self Care | Payer: Medicare Other | Source: Home / Self Care | Attending: Cardiology

## 2019-06-28 ENCOUNTER — Encounter (HOSPITAL_COMMUNITY): Payer: Self-pay

## 2019-06-28 DIAGNOSIS — R0602 Shortness of breath: Secondary | ICD-10-CM | POA: Insufficient documentation

## 2019-06-28 DIAGNOSIS — I4891 Unspecified atrial fibrillation: Secondary | ICD-10-CM | POA: Insufficient documentation

## 2019-06-28 DIAGNOSIS — Z5309 Procedure and treatment not carried out because of other contraindication: Secondary | ICD-10-CM | POA: Diagnosis not present

## 2019-06-28 DIAGNOSIS — Z950 Presence of cardiac pacemaker: Secondary | ICD-10-CM | POA: Diagnosis not present

## 2019-06-28 SURGERY — CANCELLED PROCEDURE

## 2019-06-28 MED ORDER — SODIUM CHLORIDE 0.9 % IV SOLN
INTRAVENOUS | Status: DC
  Administered 2019-06-28: 12:00:00 via INTRAVENOUS

## 2019-06-28 NOTE — Progress Notes (Addendum)
Pt admitted for cardioversion.  Device rep called and stated pt in SR.  MD spoke with pt.  Pt SHOB and MD Skains gave order for pt to increase Demadex to 3 pills in AM and 3 pills in PM until pt follow up with cardiology PA.  Pt verbalized understanding.

## 2019-06-28 NOTE — Progress Notes (Signed)
   Spoke to [employer] about getting an appointment with Purcell Nails, NP soon.   Patient was with increased shortness of breath - increased Torsemide to 60 BID. Stable to go home.   [name], MD

## 2019-06-29 ENCOUNTER — Encounter: Payer: Self-pay | Admitting: Family Medicine

## 2019-06-29 MED ORDER — APIXABAN 2.5 MG PO TABS: 2.5000 mg | ORAL_TABLET | Freq: Two times a day (BID) | ORAL | 3 refills | Status: DC

## 2019-06-29 NOTE — Telephone Encounter (Signed)
Spoke with pts daughter concerning the increase in torsemide. I advised her this was temporary until pt was able to follow up with Jory Sims, NP. She has agreed with a follow up appt with her on Monday Sept 28 @ 1115. Pt will increase his torsemide per Dr Marlou Porch recommendation for the weekend. Pt is still waiting on an appt at the [facility]. Pt's daughter states he is currently on a waiting list. I will send Dr. Caryl Comes a message to see if his appt can be hurried.

## 2019-07-01 NOTE — Progress Notes (Signed)
Cardiology Office Note   Date:  07/02/2019   ID:  Charles [name], DOB [birth year], MRN [id number]  PCP:  [name], MD  Cardiologist:  Dr.Berry  No chief complaint on file.    History of Present Illness: Charles [name] is a 85 y.o. male who presents for ongoing assessment and management of chronic diastolic heart failure, frequent decompensations and admissions to the hospital for same.  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  With other history to include renal artery stenosis with 70% bilateral stenosis.   Each time I see him in the office he continues to have fluid overload despite increasing doses of his medication regimen to include higher doses of torsemide.  Labs reveal significant renal insufficiency after doses of metolazone with a creatinine rising from 1.4-1.7.  He was referred to the advanced heart failure clinic but has yet to have an appointment.  He did follow-up with Dr. Caryl Comes on 06/07/2019.  He continued to have fluid retention.  Dr. Caryl Comes discussed possible repeat DCCV.  He was planned for this on 06/28/2019.  He was just started on amiodarone for heart rate control prior to procedure.  At the time that he arrived for the cardioversion he was in normal sinus rhythm.  He continued to complain of shortness of breath and fluid retention and they increased his Demadex to 3-20 mg pills in the a.m. and 3-20 mg pills in the p.m.  He comes today with fluid overload again.  Since he has been home he is not limited himself to low-salt or no salt foods.  He eats a Consulting civil engineer sausage biscuits in the morning, and does not limit salt and other foods which he is eating throughout the day.  He states he does not add salt to his  food.  He complains of lower extremity edema.  He does have some abdominal distention.  His daughter is very frustrated that he continues on the cycle and that we cannot seem to help him.  When seen last by Dr. Caryl Comes his weight was 223 pounds, today's weight is 226 pounds.  Dry weight 218 pounds.   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 s/p CABG in 2004 with LIMA-LAD, SVG-RCA, SVG-OM, and SVG-RI   Carotid stenosis 1999   s/p L CEA   Chronic diastolic CHF (congestive heart failure) ([place])    Colon polyp 2005   (Dr. 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   Edwards Sapien 3 THV (size 26 mm, model # 9600TFX, serial # [id number]) via the TF approach   Severe aortic stenosis    T2DM (type 2 diabetes mellitus) ([place]) 1995   Urge incontinence of urine     Past Surgical History:  Procedure Laterality Date   CARDIAC CATHETERIZATION  03/06/2003   No intervention - recommend CABG   CARDIOVASCULAR STRESS TEST  11/2010   normal perfusion, no evidence of ischemia, EF 62% post exercise   CARDIOVASCULAR STRESS TEST  11/28/2012   Mild diaphragmatic  attenuation; cannot exclude a focal region of nontransmural inferior scar   CARDIOVERSION N/A 06/25/2015   Procedure: CARDIOVERSION;  Surgeon: Pixie Casino, MD;  Location: [facility] ENDOSCOPY;  Service: Cardiovascular;  Laterality: N/A;   CARDIOVERSION N/A 11/02/2016   Procedure: CARDIOVERSION;  Surgeon: [name], MD;  Location: [place];  Service: Cardiovascular;  Laterality: N/A;   CARDIOVERSION N/A 03/20/2019   Procedure: CARDIOVERSION;  Surgeon: Jerline Pain, MD;  Location: [place] ENDOSCOPY;  Service: Cardiovascular;  Laterality: N/A;   CAROTID ENDARTERECTOMY Left 1999   ([place])   CATARACT EXTRACTION W/ INTRAOCULAR LENS  IMPLANT, BILATERAL Bilateral 01/2013   [place]   COLONOSCOPY  2003   colon polyp x3 - adenomatous Tiffany Kocher)   COLONOSCOPY  10/08/2012   2 TA, diverticulosis, int hem, no rpt rec Tiffany Kocher)   CORONARY ANGIOPLASTY     CORONARY ARTERY BYPASS GRAFT  03/07/2003   4v CABG ([place]) with LIMA to LAD, vein graft to RCA, 1st obtuse marginal, and ramus intermedius   ESOPHAGOGASTRODUODENOSCOPY  10/08/2012   nl esophagus, duodenitis and erosive gastropathy, path - gastropathy no Hpylori, no rpt rec   LUMBAR EPIDURAL INJECTION  03/2017   L5/S1 ([place])   PACEMAKER IMPLANT N/A 04/21/2018   Procedure: PACEMAKER IMPLANT -- Dual Chamber;  Surgeon: Deboraha Sprang, MD;  Location: [place] CV LAB;  Service: Cardiovascular;  Laterality: N/A;   PERIPHERAL VASCULAR CATHETERIZATION N/A 02/02/2016   Procedure: Renal Angiography;  Surgeon: [name], MD;  Location: [place] CV LAB;  Service: Cardiovascular;  Laterality: N/A;   PERIPHERAL VASCULAR CATHETERIZATION N/A 02/02/2016   Procedure: Abdominal Aortogram;  Surgeon: [name], MD;  Location: [place] CV LAB;  Service: Cardiovascular;  Laterality: N/A;   RENAL DOPPLER  11/29/2011   Celiac&SMA-demonstrated vessel narrowing suggestive of a greater than 50% diameter reduction. Bilateral renal arteries-demonstrated  vessel narrowing of 60-99% diameter reduction. Rt Kidney-mid pole lateral simple cyst noted measuring 1.29x0.76x1.11cm and exophytic cyst outside lower pole measuring 1.23x0.96x1.31. Lft Kidney-lateral mid to lower pole simple cyst measuering-1.24x9.83x1.24   RIGHT/LEFT HEART CATH AND CORONARY/GRAFT ANGIOGRAPHY N/A 10/20/2017   Procedure: RIGHT/LEFT HEART CATH AND CORONARY/GRAFT ANGIOGRAPHY;  Surgeon: Burnell Blanks, MD;  Location: [place] CV LAB;  Service: Cardiovascular;  Laterality: N/A;   TEE WITHOUT CARDIOVERSION N/A 06/25/2015   Procedure: TRANSESOPHAGEAL ECHOCARDIOGRAM (TEE);  Surgeon: Pixie Casino, MD;  Location: [facility] ENDOSCOPY;  Service: Cardiovascular;  Laterality: N/A;   TEE WITHOUT CARDIOVERSION N/A 11/02/2016   Procedure: TRANSESOPHAGEAL ECHOCARDIOGRAM (TEE);  Surgeon: [name], MD;  Location: [place];  Service: Cardiovascular;  Laterality: N/A;   TEE WITHOUT CARDIOVERSION N/A 12/05/2018   Procedure: TRANSESOPHAGEAL ECHOCARDIOGRAM (TEE);  Surgeon: Sherren Mocha, MD;  Location: [place];  Service: Open Heart Surgery;  Laterality:  N/A;   TONSILLECTOMY     TRANSCATHETER AORTIC VALVE REPLACEMENT, TRANSFEMORAL N/A 12/05/2018   Procedure: TRANSCATHETER AORTIC VALVE REPLACEMENT, TRANSFEMORAL;  Surgeon: Sherren Mocha, MD;  Location: [place];  Service: Open Heart Surgery;  Laterality: N/A;   UPPER GASTROINTESTINAL ENDOSCOPY  2003   reflux esophagitis, erosive gastropathy, duodenal ulcer     Current Outpatient Medications  Medication Sig Dispense Refill   acetaminophen (TYLENOL) 500 MG tablet Take 1 tablet (500 mg total) by mouth 3 (three) times daily. (Patient taking differently: Take 1,000 mg by mouth every 8 (eight) hours as needed for mild pain or headache. )     amiodarone (PACERONE) 200 MG tablet Take 400mg , 2 tabs, 2 times daily for 2wks; Then reduce 400mg , 2 tabs, 1 time daily for 2wks; then begin 200mg , 1 tablet, once daily. (Patient taking differently:  Take 200-400 mg by mouth See admin instructions. Take 400 mg by mouth twice daily for 2 weeks, then 400 mg daily for 2 weeks then 200 mg daily) 120 tablet 3   apixaban (ELIQUIS) 2.5 MG TABS tablet Take 1 tablet (2.5 mg total) by mouth 2 (two) times daily. 60 tablet 3   Ascorbic Acid (VITAMIN C PO) Take 1 tablet by mouth daily.     atorvastatin (LIPITOR) 20 MG tablet Take 1 tablet (20 mg total) by mouth daily. 90 tablet 3   BD INSULIN SYRINGE U/F 30G X 1/2" 0.5 ML MISC AS DIRECTED. 100 each 0   citalopram (CELEXA) 20 MG tablet Take 1 tablet (20 mg total) by mouth daily. 90 tablet 3   finasteride (PROSCAR) 5 MG tablet Take 1 tablet (5 mg total) by mouth daily. 30 tablet 5   glucose blood (ACCU-CHEK AVIVA PLUS) test strip Use as instructed to check blood sugar three times a day. Dx code E11.8 & E11.65 300 strip 1   guaiFENesin (MUCINEX) 600 MG 12 hr tablet Take 600 mg by mouth 2 (two) times daily as needed for cough or to loosen phlegm.     insulin glargine (LANTUS) 100 UNIT/ML injection Inject 0.25 mLs (25 Units total) into the skin 2 (two) times daily. (Patient taking differently: Inject 20 Units into the skin 2 (two) times daily. ) 30 mL 5   lisinopril (PRINIVIL,ZESTRIL) 40 MG tablet Take 1 tablet (40 mg total) by mouth daily. 30 tablet 11   metFORMIN (GLUCOPHAGE) 500 MG tablet Take 1 tablet (500 mg total) by mouth 2 (two) times daily with a meal. 180 tablet 1   nitroGLYCERIN (NITROSTAT) 0.4 MG SL tablet Place 1 tablet (0.4 mg total) under the tongue every 5 (five) minutes as needed for chest pain. 90 tablet 3   pantoprazole (PROTONIX) 20 MG tablet Take 1 tablet (20 mg total) by mouth daily. 90 tablet 3   potassium chloride SA (K-DUR) 20 MEQ tablet Take 1 tablet (20 mEq total) every Monday, Wednesday, Friday, Saturday, and Sunday. (Patient taking differently: Take 20 mEq by mouth See admin instructions. Take 1 tablet (20 mEq total) every Monday, Wednesday, Friday, Saturday, and Sunday.)  30 tablet 11   tamsulosin (FLOMAX) 0.4 MG CAPS capsule Take 1 capsule (0.4 mg total) by mouth daily after supper. 30 capsule 5   torsemide (DEMADEX) 20 MG tablet Take 3 tablets (60 mg total) by mouth daily. 90 tablet 1   traZODone (DESYREL) 50 MG tablet Take 1 tablet (50 mg total) by mouth at bedtime. 90 tablet 3   No current facility-administered medications for this visit.     Allergies:  Gabapentin, Metoprolol, Spironolactone, Amlodipine, Other, Rosiglitazone maleate, and Tricor [fenofibrate]    Social History:  The patient  reports that he quit smoking about 5 years ago. His smoking use included cigarettes. He has a 18.00 pack-year smoking history. He has never used smokeless tobacco. He reports that he does not drink alcohol or use drugs.   Family History:  The patient's family history includes Cancer in his brother, brother, maternal grandmother, and mother; Diabetes in his sister; Heart attack in his maternal grandfather, paternal grandfather, and paternal grandmother; Heart disease in his father and sister; Hypertension in his father and sister; Lung disease in his brother; Stroke in his sister.    ROS: All other systems are reviewed and negative. Unless otherwise mentioned in H&P    PHYSICAL EXAM: VS:  BP 123/68    Pulse 70    Ht 6\' 1"  (1.854 m)    Wt 226 lb (102.5 kg)    SpO2 93%    BMI 29.82 kg/m  , BMI Body mass index is 29.82 kg/m. GEN: Well nourished, well developed, in no acute distress HEENT: normal Neck: no JVD, carotid bruits, or masses Cardiac: RRR; no murmurs, rubs, or gallops, 2+ pitting pretibial edema  Respiratory:  Clear to auscultation bilaterally, normal work of breathing GI: soft, nontender, distended, + BS MS: no deformity or atrophy Skin: warm and dry, no rash Neuro:  Strength and sensation are intact Psych: euthymic mood, full affect   EKG: A sensed, ventricular paced, rhythm rate of 71 bpm.  Recent Labs: 01/26/2019: NT-Pro BNP 1,162 03/19/2019:  ALT 20; TSH 1.707 03/20/2019: Magnesium 1.7 05/24/2019: B Natriuretic Peptide 178.5 06/25/2019: BUN 56; Creatinine, Ser 2.21; Hemoglobin 10.1; Platelets 142; Potassium 4.6; Sodium 139    Lipid Panel    Component Value Date/Time   CHOL 137 02/23/2019 1104   TRIG 283.0 (H) 02/23/2019 1104   TRIG 133 09/10/2011   HDL 30.40 (L) 02/23/2019 1104   CHOLHDL 5 02/23/2019 1104   VLDL 56.6 (H) 02/23/2019 1104   LDLCALC 73 06/08/2016 1423   LDLDIRECT 75.0 02/23/2019 1104      Wt Readings from Last 3 Encounters:  07/02/19 226 lb (102.5 kg)  06/07/19 223 lb 8 oz (101.4 kg)  05/30/19 220 lb (99.8 kg)      Other studies Reviewed: Echocardiogram July 05, 2019 1. The left ventricle has low normal systolic function, with an ejection fraction of 50-55%. The cavity size was normal. There is moderately increased left ventricular wall thickness. Left ventricular diastolic Doppler parameters are consistent with  impaired relaxation. Elevated left atrial and left ventricular end-diastolic pressures The E/e' is >20. There is abnormal septal motion consistent with RV pacemaker.  2. Moderate hypokinesis of the left ventricular, basal-mid septal wall.  3. The mitral valve is degenerative. Mild thickening of the mitral valve leaflet. Moderate calcification of the mitral valve leaflet. There is moderate mitral annular calcification present. Mild mitral valve stenosis.  4. The tricuspid valve is grossly normal.  5. A 26 an Edwards Edwards Sapien bioprosthetic aortic valve (TAVR) valve is present in the aortic position. Procedure Date: 12/05/2018 Echo findings are consistent with mild perivalvular leak of the aortic prosthesis.  6. Moderate calcification of the aortic valve. mild stenosis of the aortic valve.  7. Left atrial size was moderately dilated.  8. Right atrial size was moderately dilated.  9. The right ventricle has normal systolic function. The cavity was normal. There is no increase in right ventricular  wall thickness. 10. When compared to the  prior study: 12/06/2018: LVEF 55-60%.  ASSESSMENT AND PLAN:  1.  Chronic diastolic heart failure: The patient does not appear to want to take responsibility for his diet leading to frequent decompensations and fluid retention requiring hospitalizations.  His daughter is frustrated that he continues to cycle and would like [place] to do more to help him.  Going up on diuretics worsens his kidney function.  Most recent creatinine was 2.23 with use of metolazone.  He is currently on torsemide 60 mg in the morning and 60 mg in the evening and has lost about 3 pounds.  I have advised him to continue this dosing for 2 more days and then return back to 60 mg in the morning if he reduces the salt in his diet.  He does not like having to take so much diuretic yet is unwilling to limit the salted foods that he eats.  I have talked with the patient about working together concerning maintaining his fluid balance.  I talked him about salted foods that he needs to eliminate.  I have also given him a copy of a diagram of "the salty 6" to help him see which foods you are eating that has the salt in it.  I have specifically pointed out bread and sausage.  He is not inclined to change his eating habits at this time, but states he will try.  I feel that this gentleman will continue the cycle of decompensation until he is willing to take responsibility for the salt intake.  I suggested that he do follow-up with nephrology due to worsening kidney function but his daughter is frustrated and does not want to see yet another physician or practice concerning his current condition.  She does not feel that they will do anything differently than what cardiology is doing.  I have also advised that she see the advanced heart failure clinic due to his frequent admissions due to decompensation.  He continues to be on a waiting list.  He will follow-up with Dr. [name] on next office visit for any further  recommendations that he may have to convince the patient to be more compliant concerning his regimen.  2.  Pacemaker in situ: He is followed by Dr. Caryl Comes and will continue his regimen and remote pacemaker checks as recommended.  3.  PAF: He continues on amiodarone 200 mg daily and apixaban 2.5 mg twice daily.  No complaints of bleeding or excessive bruising.  EKG reveals atrial sensed with ventricular pacing.  He did not require cardioversion during recent hospitalization.  4.  Insulin-dependent diabetes: Followed by PCP.  They will continue recommendations and medication adjustments at their discretion.  Current medicines are reviewed at length with the patient today.    Labs/ tests ordered today include: None  Phill Myron. [place] Pugh, ANP, AACC   07/02/2019 12:25 PM    [place] [address] [address] Office [phone] Fax [phone]

## 2019-07-02 ENCOUNTER — Ambulatory Visit (INDEPENDENT_AMBULATORY_CARE_PROVIDER_SITE_OTHER): Payer: Medicare Other | Admitting: Adult Health

## 2019-07-02 ENCOUNTER — Other Ambulatory Visit: Payer: Self-pay

## 2019-07-02 ENCOUNTER — Encounter: Payer: Self-pay | Admitting: Adult Health

## 2019-07-02 VITALS — BP 123/68 | HR 70 | Ht 73.0 in | Wt 226.0 lb

## 2019-07-02 DIAGNOSIS — Z950 Presence of cardiac pacemaker: Secondary | ICD-10-CM | POA: Diagnosis not present

## 2019-07-02 DIAGNOSIS — I5032 Chronic diastolic (congestive) heart failure: Secondary | ICD-10-CM | POA: Diagnosis not present

## 2019-07-02 DIAGNOSIS — I480 Paroxysmal atrial fibrillation: Secondary | ICD-10-CM

## 2019-07-02 DIAGNOSIS — Z79899 Other long term (current) drug therapy: Secondary | ICD-10-CM

## 2019-07-02 DIAGNOSIS — I5033 Acute on chronic diastolic (congestive) heart failure: Secondary | ICD-10-CM

## 2019-07-02 NOTE — Patient Instructions (Signed)
Medication Instructions:  Continue current medications  If you need a refill on your cardiac medications before your next appointment, please call your pharmacy.  Labwork: BMP Today HERE IN OUR OFFICE AT [facility]   You will NOT need to fast   Take the provided lab slips with you to the lab for your blood draw.   When you have your labs (blood work) drawn today and your tests are completely normal, you will receive your results only by MyChart Message (if you have MyChart) -OR-  A paper copy in the mail.  If you have any lab test that is abnormal or we need to change your treatment, we will call you to review these results.  Testing/Procedures: None Ordered   Follow-Up: You will need a follow up appointment in 2 months.  Please call our office 2 months in advance to schedule this appointment.  You may see [name], MD or one of the following Advanced Practice Providers on your designated Care Team:   Kerin Ransom, PA-C Roby Lofts, [place] . Sande Rives, PA-C     At [facility], you and your health needs are our priority.  As part of our continuing mission to provide you with exceptional heart care, we have created designated Provider Care Teams.  These Care Teams include your primary Cardiologist (physician) and Advanced Practice Providers (APPs -  Physician Assistants and Nurse Practitioners) who all work together to provide you with the care you need, when you need it.  Thank you for choosing [facility] at [facility]!!

## 2019-07-03 LAB — BASIC METABOLIC PANEL
BUN/Creatinine Ratio: 22 (ref 10–24)
BUN: 40 mg/dL — ABNORMAL HIGH (ref 8–27)
CO2: 25 mmol/L (ref 20–29)
Calcium: 9.5 mg/dL (ref 8.6–10.2)
Chloride: 96 mmol/L (ref 96–106)
Creatinine, Ser: 1.82 mg/dL — ABNORMAL HIGH (ref 0.76–1.27)
GFR calc Af Amer: 39 mL/min/{1.73_m2} — ABNORMAL LOW (ref >59)
GFR calc non Af Amer: 33 mL/min/{1.73_m2} — ABNORMAL LOW (ref >59)
Glucose: 321 mg/dL — ABNORMAL HIGH (ref 65–99)
Potassium: 4.8 mmol/L (ref 3.5–5.2)
Sodium: 136 mmol/L (ref 134–144)

## 2019-07-12 ENCOUNTER — Other Ambulatory Visit: Payer: Self-pay | Admitting: Family Medicine

## 2019-07-12 NOTE — Telephone Encounter (Signed)
Last office visit 05/30/2019 for CHF.  Last refilled 02/28/2019 for #90 with 3 refills with instructions to take 1 tablets by mouth at bedtime.   Pharmacy request is for 2 tablets by mouth at bedtime.  Please confirm if patient is suppose to be taking 1 or 2 tablets at bedtime.

## 2019-07-16 ENCOUNTER — Other Ambulatory Visit: Payer: Self-pay | Admitting: Family Medicine

## 2019-07-19 ENCOUNTER — Encounter: Payer: Self-pay | Admitting: Family Medicine

## 2019-07-19 DIAGNOSIS — J9610 Chronic respiratory failure, unspecified whether with hypoxia or hypercapnia: Secondary | ICD-10-CM | POA: Insufficient documentation

## 2019-07-20 ENCOUNTER — Telehealth: Payer: Self-pay

## 2019-07-20 NOTE — Telephone Encounter (Signed)
Received faxed Certificate of Medical Necessity order for O2 from [facility].  Placed order in Dr. Synthia Innocent box.

## 2019-07-20 NOTE — Telephone Encounter (Signed)
This should have already been faxed back yesterday or this morning. plz call Monday to verify they received it.

## 2019-07-23 NOTE — Telephone Encounter (Signed)
Noted  

## 2019-07-23 NOTE — Telephone Encounter (Signed)
Spoke with [facility] (who is handling O2).  Says they will have to check with Almyra Free to see if order was received and let us know.

## 2019-07-23 NOTE — Telephone Encounter (Signed)
Charles [name] returned your call.She stated that she did received the order from our office

## 2019-07-24 MED ORDER — TORSEMIDE 20 MG PO TABS: 60.0000 mg | ORAL_TABLET | Freq: Every day | ORAL | 0 refills | Status: DC

## 2019-07-24 MED ORDER — TORSEMIDE 20 MG PO TABS: 60.0000 mg | ORAL_TABLET | Freq: Every day | ORAL | 1 refills | Status: DC

## 2019-07-24 NOTE — Addendum Note (Signed)
Addended by: Caprice Beaver T on: 07/24/2019 08:04 AM   Modules accepted: Orders

## 2019-07-27 DIAGNOSIS — I5033 Acute on chronic diastolic (congestive) heart failure: Secondary | ICD-10-CM | POA: Diagnosis not present

## 2019-08-07 ENCOUNTER — Encounter: Payer: Self-pay | Admitting: Family Medicine

## 2019-08-07 DIAGNOSIS — I350 Nonrheumatic aortic (valve) stenosis: Secondary | ICD-10-CM

## 2019-08-07 DIAGNOSIS — J9610 Chronic respiratory failure, unspecified whether with hypoxia or hypercapnia: Secondary | ICD-10-CM

## 2019-08-07 DIAGNOSIS — I5033 Acute on chronic diastolic (congestive) heart failure: Secondary | ICD-10-CM

## 2019-08-08 ENCOUNTER — Encounter: Payer: Self-pay | Admitting: Family Medicine

## 2019-08-08 NOTE — Telephone Encounter (Signed)
Replied via other mychart message.  

## 2019-08-08 NOTE — Telephone Encounter (Signed)
Palliative care referral placed.

## 2019-08-09 ENCOUNTER — Telehealth: Payer: Self-pay

## 2019-08-09 NOTE — Telephone Encounter (Signed)
Talked to pt daughter-in-law. Made an appt to be seen on 11/11 with Coletta Memos for swelling and SOB.

## 2019-08-09 NOTE — Telephone Encounter (Signed)
Called pt to try to set up an appt to be seen in office with APP for swelling. Pt has already doubled torsemide dosage for 3 days. Needs to be seen and have labs checked.

## 2019-08-10 ENCOUNTER — Emergency Department: Payer: Medicare Other

## 2019-08-10 ENCOUNTER — Other Ambulatory Visit: Payer: Self-pay

## 2019-08-10 ENCOUNTER — Encounter: Payer: Self-pay | Admitting: Emergency Medicine

## 2019-08-10 ENCOUNTER — Emergency Department
Admission: EM | Admit: 2019-08-10 | Discharge: 2019-08-10 | Disposition: A | Discharge status: Home / Self Care | Payer: Medicare Other | Source: Home / Self Care | Attending: Emergency Medicine | Admitting: Emergency Medicine

## 2019-08-10 ENCOUNTER — Other Ambulatory Visit: Payer: Self-pay | Admitting: Primary Care

## 2019-08-10 ENCOUNTER — Telehealth: Payer: Self-pay | Admitting: Primary Care

## 2019-08-10 DIAGNOSIS — I130 Hypertensive heart and chronic kidney disease with heart failure and stage 1 through stage 4 chronic kidney disease, or unspecified chronic kidney disease: Secondary | ICD-10-CM | POA: Insufficient documentation

## 2019-08-10 DIAGNOSIS — M5136 Other intervertebral disc degeneration, lumbar region: Secondary | ICD-10-CM | POA: Diagnosis not present

## 2019-08-10 DIAGNOSIS — M1990 Unspecified osteoarthritis, unspecified site: Secondary | ICD-10-CM | POA: Diagnosis not present

## 2019-08-10 DIAGNOSIS — E118 Type 2 diabetes mellitus with unspecified complications: Secondary | ICD-10-CM | POA: Diagnosis not present

## 2019-08-10 DIAGNOSIS — M25552 Pain in left hip: Secondary | ICD-10-CM | POA: Insufficient documentation

## 2019-08-10 DIAGNOSIS — J9610 Chronic respiratory failure, unspecified whether with hypoxia or hypercapnia: Secondary | ICD-10-CM | POA: Diagnosis not present

## 2019-08-10 DIAGNOSIS — G8929 Other chronic pain: Secondary | ICD-10-CM | POA: Diagnosis not present

## 2019-08-10 DIAGNOSIS — Z7401 Bed confinement status: Secondary | ICD-10-CM | POA: Diagnosis not present

## 2019-08-10 DIAGNOSIS — D126 Benign neoplasm of colon, unspecified: Secondary | ICD-10-CM | POA: Diagnosis not present

## 2019-08-10 DIAGNOSIS — M5416 Radiculopathy, lumbar region: Secondary | ICD-10-CM | POA: Diagnosis not present

## 2019-08-10 DIAGNOSIS — I6529 Occlusion and stenosis of unspecified carotid artery: Secondary | ICD-10-CM | POA: Diagnosis not present

## 2019-08-10 DIAGNOSIS — Z743 Need for continuous supervision: Secondary | ICD-10-CM | POA: Diagnosis not present

## 2019-08-10 DIAGNOSIS — K219 Gastro-esophageal reflux disease without esophagitis: Secondary | ICD-10-CM | POA: Diagnosis not present

## 2019-08-10 DIAGNOSIS — U071 COVID-19: Secondary | ICD-10-CM | POA: Diagnosis not present

## 2019-08-10 DIAGNOSIS — I5033 Acute on chronic diastolic (congestive) heart failure: Secondary | ICD-10-CM | POA: Diagnosis not present

## 2019-08-10 DIAGNOSIS — R069 Unspecified abnormalities of breathing: Secondary | ICD-10-CM | POA: Diagnosis not present

## 2019-08-10 DIAGNOSIS — Z951 Presence of aortocoronary bypass graft: Secondary | ICD-10-CM | POA: Insufficient documentation

## 2019-08-10 DIAGNOSIS — F331 Major depressive disorder, recurrent, moderate: Secondary | ICD-10-CM | POA: Diagnosis not present

## 2019-08-10 DIAGNOSIS — K5730 Diverticulosis of large intestine without perforation or abscess without bleeding: Secondary | ICD-10-CM | POA: Diagnosis not present

## 2019-08-10 DIAGNOSIS — Z66 Do not resuscitate: Secondary | ICD-10-CM | POA: Diagnosis not present

## 2019-08-10 DIAGNOSIS — Z7189 Other specified counseling: Secondary | ICD-10-CM | POA: Diagnosis not present

## 2019-08-10 DIAGNOSIS — S7002XA Contusion of left hip, initial encounter: Secondary | ICD-10-CM | POA: Diagnosis not present

## 2019-08-10 DIAGNOSIS — E785 Hyperlipidemia, unspecified: Secondary | ICD-10-CM | POA: Diagnosis not present

## 2019-08-10 DIAGNOSIS — S3993XA Unspecified injury of pelvis, initial encounter: Secondary | ICD-10-CM | POA: Diagnosis not present

## 2019-08-10 DIAGNOSIS — I25118 Atherosclerotic heart disease of native coronary artery with other forms of angina pectoris: Secondary | ICD-10-CM | POA: Diagnosis not present

## 2019-08-10 DIAGNOSIS — R609 Edema, unspecified: Secondary | ICD-10-CM

## 2019-08-10 DIAGNOSIS — N182 Chronic kidney disease, stage 2 (mild): Secondary | ICD-10-CM | POA: Insufficient documentation

## 2019-08-10 DIAGNOSIS — I441 Atrioventricular block, second degree: Secondary | ICD-10-CM | POA: Diagnosis not present

## 2019-08-10 DIAGNOSIS — R0689 Other abnormalities of breathing: Secondary | ICD-10-CM | POA: Diagnosis not present

## 2019-08-10 DIAGNOSIS — R601 Generalized edema: Secondary | ICD-10-CM | POA: Diagnosis not present

## 2019-08-10 DIAGNOSIS — R600 Localized edema: Secondary | ICD-10-CM | POA: Diagnosis not present

## 2019-08-10 DIAGNOSIS — Z952 Presence of prosthetic heart valve: Secondary | ICD-10-CM | POA: Diagnosis not present

## 2019-08-10 DIAGNOSIS — J449 Chronic obstructive pulmonary disease, unspecified: Secondary | ICD-10-CM | POA: Insufficient documentation

## 2019-08-10 DIAGNOSIS — E1122 Type 2 diabetes mellitus with diabetic chronic kidney disease: Secondary | ICD-10-CM | POA: Insufficient documentation

## 2019-08-10 DIAGNOSIS — I350 Nonrheumatic aortic (valve) stenosis: Secondary | ICD-10-CM | POA: Diagnosis not present

## 2019-08-10 DIAGNOSIS — I959 Hypotension, unspecified: Secondary | ICD-10-CM | POA: Diagnosis not present

## 2019-08-10 DIAGNOSIS — S79912A Unspecified injury of left hip, initial encounter: Secondary | ICD-10-CM | POA: Diagnosis not present

## 2019-08-10 DIAGNOSIS — R262 Difficulty in walking, not elsewhere classified: Secondary | ICD-10-CM | POA: Diagnosis not present

## 2019-08-10 DIAGNOSIS — J432 Centrilobular emphysema: Secondary | ICD-10-CM | POA: Diagnosis not present

## 2019-08-10 DIAGNOSIS — R531 Weakness: Secondary | ICD-10-CM

## 2019-08-10 DIAGNOSIS — I4892 Unspecified atrial flutter: Secondary | ICD-10-CM | POA: Diagnosis not present

## 2019-08-10 DIAGNOSIS — N179 Acute kidney failure, unspecified: Secondary | ICD-10-CM | POA: Diagnosis not present

## 2019-08-10 DIAGNOSIS — Z950 Presence of cardiac pacemaker: Secondary | ICD-10-CM | POA: Insufficient documentation

## 2019-08-10 DIAGNOSIS — I5031 Acute diastolic (congestive) heart failure: Secondary | ICD-10-CM | POA: Diagnosis not present

## 2019-08-10 DIAGNOSIS — R2681 Unsteadiness on feet: Secondary | ICD-10-CM | POA: Diagnosis not present

## 2019-08-10 DIAGNOSIS — E860 Dehydration: Secondary | ICD-10-CM | POA: Diagnosis not present

## 2019-08-10 DIAGNOSIS — M2550 Pain in unspecified joint: Secondary | ICD-10-CM | POA: Diagnosis not present

## 2019-08-10 DIAGNOSIS — R0602 Shortness of breath: Secondary | ICD-10-CM | POA: Diagnosis not present

## 2019-08-10 DIAGNOSIS — Z7901 Long term (current) use of anticoagulants: Secondary | ICD-10-CM | POA: Diagnosis not present

## 2019-08-10 DIAGNOSIS — I509 Heart failure, unspecified: Secondary | ICD-10-CM | POA: Insufficient documentation

## 2019-08-10 DIAGNOSIS — J811 Chronic pulmonary edema: Secondary | ICD-10-CM | POA: Diagnosis not present

## 2019-08-10 DIAGNOSIS — Z961 Presence of intraocular lens: Secondary | ICD-10-CM | POA: Diagnosis not present

## 2019-08-10 DIAGNOSIS — Z87891 Personal history of nicotine dependence: Secondary | ICD-10-CM | POA: Insufficient documentation

## 2019-08-10 DIAGNOSIS — I701 Atherosclerosis of renal artery: Secondary | ICD-10-CM | POA: Diagnosis not present

## 2019-08-10 DIAGNOSIS — Z20828 Contact with and (suspected) exposure to other viral communicable diseases: Secondary | ICD-10-CM | POA: Insufficient documentation

## 2019-08-10 DIAGNOSIS — I714 Abdominal aortic aneurysm, without rupture: Secondary | ICD-10-CM | POA: Diagnosis not present

## 2019-08-10 DIAGNOSIS — Z515 Encounter for palliative care: Secondary | ICD-10-CM | POA: Diagnosis not present

## 2019-08-10 DIAGNOSIS — S299XXA Unspecified injury of thorax, initial encounter: Secondary | ICD-10-CM | POA: Diagnosis not present

## 2019-08-10 DIAGNOSIS — E1165 Type 2 diabetes mellitus with hyperglycemia: Secondary | ICD-10-CM | POA: Diagnosis not present

## 2019-08-10 DIAGNOSIS — R0902 Hypoxemia: Secondary | ICD-10-CM | POA: Diagnosis not present

## 2019-08-10 DIAGNOSIS — I739 Peripheral vascular disease, unspecified: Secondary | ICD-10-CM | POA: Diagnosis not present

## 2019-08-10 DIAGNOSIS — M5116 Intervertebral disc disorders with radiculopathy, lumbar region: Secondary | ICD-10-CM | POA: Diagnosis not present

## 2019-08-10 DIAGNOSIS — M6281 Muscle weakness (generalized): Secondary | ICD-10-CM | POA: Diagnosis not present

## 2019-08-10 DIAGNOSIS — I2510 Atherosclerotic heart disease of native coronary artery without angina pectoris: Secondary | ICD-10-CM | POA: Insufficient documentation

## 2019-08-10 DIAGNOSIS — E7800 Pure hypercholesterolemia, unspecified: Secondary | ICD-10-CM | POA: Diagnosis not present

## 2019-08-10 DIAGNOSIS — I5023 Acute on chronic systolic (congestive) heart failure: Secondary | ICD-10-CM | POA: Diagnosis not present

## 2019-08-10 DIAGNOSIS — I10 Essential (primary) hypertension: Secondary | ICD-10-CM | POA: Diagnosis not present

## 2019-08-10 DIAGNOSIS — J9611 Chronic respiratory failure with hypoxia: Secondary | ICD-10-CM | POA: Diagnosis not present

## 2019-08-10 DIAGNOSIS — I5043 Acute on chronic combined systolic (congestive) and diastolic (congestive) heart failure: Secondary | ICD-10-CM | POA: Diagnosis not present

## 2019-08-10 LAB — CBC WITH DIFFERENTIAL/PLATELET
Abs Immature Granulocytes: 0.04 10*3/uL (ref 0.00–0.07)
Basophils Absolute: 0.1 10*3/uL (ref 0.0–0.1)
Basophils Relative: 1 %
Eosinophils Absolute: 0.1 10*3/uL (ref 0.0–0.5)
Eosinophils Relative: 3 %
HCT: 31.2 % — ABNORMAL LOW (ref 39.0–52.0)
Hemoglobin: 9.7 g/dL — ABNORMAL LOW (ref 13.0–17.0)
Immature Granulocytes: 1 %
Lymphocytes Relative: 13 %
Lymphs Abs: 0.7 10*3/uL (ref 0.7–4.0)
MCH: 29.5 pg (ref 26.0–34.0)
MCHC: 31.1 g/dL (ref 30.0–36.0)
MCV: 94.8 fL (ref 80.0–100.0)
Monocytes Absolute: 0.6 10*3/uL (ref 0.1–1.0)
Monocytes Relative: 10 %
Neutro Abs: 4.1 10*3/uL (ref 1.7–7.7)
Neutrophils Relative %: 72 %
Platelets: 159 10*3/uL (ref 150–400)
RBC: 3.29 MIL/uL — ABNORMAL LOW (ref 4.22–5.81)
RDW: 13.2 % (ref 11.5–15.5)
WBC: 5.6 10*3/uL (ref 4.0–10.5)
nRBC: 0 % (ref 0.0–0.2)

## 2019-08-10 LAB — PROTIME-INR
INR: 1.1 (ref 0.8–1.2)
Prothrombin Time: 14.3 seconds (ref 11.4–15.2)

## 2019-08-10 LAB — COMPREHENSIVE METABOLIC PANEL
ALT: 15 U/L (ref 0–44)
AST: 19 U/L (ref 15–41)
Albumin: 3.8 g/dL (ref 3.5–5.0)
Alkaline Phosphatase: 79 U/L (ref 38–126)
Anion gap: 12 (ref 5–15)
BUN: 57 mg/dL — ABNORMAL HIGH (ref 8–23)
CO2: 29 mmol/L (ref 22–32)
Calcium: 9.1 mg/dL (ref 8.9–10.3)
Chloride: 95 mmol/L — ABNORMAL LOW (ref 98–111)
Creatinine, Ser: 2 mg/dL — ABNORMAL HIGH (ref 0.61–1.24)
GFR calc Af Amer: 34 mL/min — ABNORMAL LOW (ref >60)
GFR calc non Af Amer: 30 mL/min — ABNORMAL LOW (ref >60)
Glucose, Bld: 295 mg/dL — ABNORMAL HIGH (ref 70–99)
Potassium: 4.5 mmol/L (ref 3.5–5.1)
Sodium: 136 mmol/L (ref 135–145)
Total Bilirubin: 0.9 mg/dL (ref 0.3–1.2)
Total Protein: 7.3 g/dL (ref 6.5–8.1)

## 2019-08-10 LAB — URINALYSIS, COMPLETE (UACMP) WITH MICROSCOPIC
Bacteria, UA: NONE SEEN
Bilirubin Urine: NEGATIVE
Glucose, UA: 150 mg/dL — AB
Hgb urine dipstick: NEGATIVE
Ketones, ur: NEGATIVE mg/dL
Nitrite: NEGATIVE
Protein, ur: NEGATIVE mg/dL
Specific Gravity, Urine: 1.008 (ref 1.005–1.030)
Squamous Epithelial / HPF: NONE SEEN (ref 0–5)
pH: 6 (ref 5.0–8.0)

## 2019-08-10 LAB — SARS CORONAVIRUS 2 (TAT 6-24 HRS): SARS Coronavirus 2: NEGATIVE

## 2019-08-10 LAB — TROPONIN I (HIGH SENSITIVITY): Troponin I (High Sensitivity): 53 ng/L — ABNORMAL HIGH (ref <18)

## 2019-08-10 MED ORDER — FUROSEMIDE 10 MG/ML IJ SOLN
80.0000 mg | Freq: Once | INTRAMUSCULAR | Status: AC
  Administered 2019-08-10: 80 mg via INTRAVENOUS
  Filled 2019-08-10: qty 8

## 2019-08-10 NOTE — Discharge Instructions (Addendum)
Double your diuretics for 2 more days and then go back to your prescribed dose.

## 2019-08-10 NOTE — ED Provider Notes (Signed)
[facility] Emergency Department Provider Note       Time seen: ----------------------------------------- 10:15 AM on 08/10/2019 -----------------------------------------   I have reviewed the triage vital signs and the nursing notes.  HISTORY   Chief Complaint Weakness    HPI Charles [name] is a 85 y.o. male with a history of arthritis, atrial flutter, coronary artery disease, CHF, hyperlipidemia, hypertension, aortic stenosis who presents to the ED for weakness.  Patient arrives by EMS from home.  EMS states family was concerned because he has been having increased edema.  He fell on Monday and has been able to walk with his walker since he fell.  Complaining of left hip pain with left hip bruising.  Past Medical History:  Diagnosis Date  . Abdominal aortic atherosclerosis ([place])    by xray  . Arthritis    in lower back (06/24/2015)  . Atrial flutter ([place])    notes 06/24/2015  . AV block, [place] 1 09/06/2017   Archie Endo 09/06/2017  . 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 CAD (coronary artery disease) 2004   a. s/p CABG in 2004 with LIMA-LAD, SVG-RCA, SVG-OM, and SVG-RI  . Carotid stenosis 1999   s/p L CEA  . Chronic diastolic CHF (congestive heart failure) ([place])   . Colon polyp 2005   (Dr. Tiffany Kocher)  . Compression fracture of L1 lumbar vertebra (HCC) remote  . COPD (chronic obstructive pulmonary disease) ([place]) 03/2011   by xray  . Depression    hx  . GERD (gastroesophageal reflux disease) 2003   h/o duodenal ulcer per EGD as well as esophagitis  . History of colon polyps 2003, 2005   adenomatous Vira Agar)  . History of tobacco abuse   . HLD (hyperlipidemia)   . HTN (hypertension)   . Presence of permanent cardiac pacemaker   . Psoriasis   . Renal artery stenosis ([place]) 2004   70% bilateral, followed by cards  . 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 Severe aortic stenosis   . T2DM (type 2 diabetes mellitus) ([place]) 1995  . Urge incontinence of urine     Patient Active Problem List   Diagnosis Date Noted  . Chronic respiratory failure ([place]) 07/19/2019  . AKI (acute kidney injury) ([place]) 05/25/2019  . CKD (chronic kidney disease), stage II 12/27/2018  . S/P TAVR (transcatheter aortic valve replacement)   . 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 Paroxysmal atrial flutter ([place])   . Scrotal swelling 02/16/2018  . Coronary artery disease involving native coronary artery of native heart without angina pectoris   . Acute on chronic diastolic CHF (congestive heart failure) ([place]) 09/06/2017  . Urinary urgency 07/01/2017  . Chronic anticoagulation 11/05/2016  . Chronic radicular pain of lower back 02/17/2016  . Aortic stenosis, severe 06/04/2014  . DDD (degenerative disc disease), lumbar 10/02/2013  . Ex-smoker   . Carotid arterial disease ([place]) 08/23/2013  . MDD (major depressive disorder), recurrent episode, moderate ([place]) 09/21/2007  . OSTEOARTHRITIS 09/21/2007  . COLONIC POLYPS 01/31/2007  . Diabetes mellitus type 2, uncontrolled, with complications ([place]) 99991111  . HYPERCHOLESTEROLEMIA 01/31/2007  . ERECTILE DYSFUNCTION 01/31/2007  . Essential hypertension 01/31/2007  . GERD 01/31/2007  . PSORIASIS 01/31/2007    Past Surgical History:  Procedure Laterality Date  . CARDIAC CATHETERIZATION  03/06/2003   No intervention -  recommend CABG  . CARDIOVASCULAR STRESS TEST  11/2010   normal perfusion, no evidence of ischemia, EF 62% post exercise  . CARDIOVASCULAR STRESS TEST  11/28/2012   Mild diaphragmatic attenuation; cannot exclude a focal region of nontransmural inferior scar  . CARDIOVERSION N/A 06/25/2015   Procedure: CARDIOVERSION;  Surgeon: Pixie Casino, MD;  Location: [facility] ENDOSCOPY;  Service: Cardiovascular;  Laterality: N/A;  . CARDIOVERSION N/A  11/02/2016   Procedure: CARDIOVERSION;  Surgeon: [name], MD;  Location: [place];  Service: Cardiovascular;  Laterality: N/A;  . CARDIOVERSION N/A 03/20/2019   Procedure: CARDIOVERSION;  Surgeon: Jerline Pain, MD;  Location: [place];  Service: Cardiovascular;  Laterality: N/A;  . CAROTID ENDARTERECTOMY Left 1999   ([place])  . CATARACT EXTRACTION W/ INTRAOCULAR LENS  IMPLANT, BILATERAL Bilateral 01/2013   [place]  . COLONOSCOPY  2003   colon polyp x3 - adenomatous Tiffany Kocher)  . COLONOSCOPY  10/08/2012   2 TA, diverticulosis, int hem, no rpt rec Tiffany Kocher)  . CORONARY ANGIOPLASTY    . CORONARY ARTERY BYPASS GRAFT  03/07/2003   4v CABG ([place]) with LIMA to LAD, vein graft to RCA, 1st obtuse marginal, and ramus intermedius  . ESOPHAGOGASTRODUODENOSCOPY  10/08/2012   nl esophagus, duodenitis and erosive gastropathy, path - gastropathy no Hpylori, no rpt rec  . LUMBAR EPIDURAL INJECTION  03/2017   L5/S1 ([place])  . PACEMAKER IMPLANT N/A 04/21/2018   Procedure: PACEMAKER IMPLANT -- Dual Chamber;  Surgeon: Deboraha Sprang, MD;  Location: [place] CV LAB;  Service: Cardiovascular;  Laterality: N/A;  . PERIPHERAL VASCULAR CATHETERIZATION N/A 02/02/2016   Procedure: Renal Angiography;  Surgeon: [name], MD;  Location: [place] CV LAB;  Service: Cardiovascular;  Laterality: N/A;  . PERIPHERAL VASCULAR CATHETERIZATION N/A 02/02/2016   Procedure: Abdominal Aortogram;  Surgeon: [name], MD;  Location: [place] CV LAB;  Service: Cardiovascular;  Laterality: N/A;  . RENAL DOPPLER  11/29/2011   Celiac&SMA-demonstrated vessel narrowing suggestive of a greater than 50% diameter reduction. Bilateral renal arteries-demonstrated vessel narrowing of 60-99% diameter reduction. Rt Kidney-mid pole lateral simple cyst noted measuring 1.29x0.76x1.11cm and exophytic cyst outside lower pole measuring 1.23x0.96x1.31. Lft Kidney-lateral mid to lower pole simple cyst measuering-1.24x9.83x1.24   . RIGHT/LEFT HEART CATH AND CORONARY/GRAFT ANGIOGRAPHY N/A 10/20/2017   Procedure: RIGHT/LEFT HEART CATH AND CORONARY/GRAFT ANGIOGRAPHY;  Surgeon: Burnell Blanks, MD;  Location: [place] CV LAB;  Service: Cardiovascular;  Laterality: N/A;  . TEE WITHOUT CARDIOVERSION N/A 06/25/2015   Procedure: TRANSESOPHAGEAL ECHOCARDIOGRAM (TEE);  Surgeon: Pixie Casino, MD;  Location[facility] ENDOSCOPY;  Service: Cardiovascular;  Laterality: N/A;  . TEE WITHOUT CARDIOVERSION N/A 11/02/2016   Procedure: TRANSESOPHAGEAL ECHOCARDIOGRAM (TEE);  Surgeon: [name], MD;  Location: [facility];  Service: Cardiovascular;  Laterality: N/A;  . TEE WITHOUT CARDIOVERSION N/A 12/05/2018   Procedure: TRANSESOPHAGEAL ECHOCARDIOGRAM (TEE);  Surgeon: Sherren Mocha, MD;  Location: [place];  Service: Open Heart Surgery;  Laterality: N/A;  . TONSILLECTOMY    . TRANSCATHETER AORTIC VALVE REPLACEMENT, TRANSFEMORAL N/A 12/05/2018   Procedure: TRANSCATHETER AORTIC VALVE REPLACEMENT, TRANSFEMORAL;  Surgeon: Sherren Mocha, MD;  Location: [place];  Service: Open Heart Surgery;  Laterality: N/A;  . UPPER GASTROINTESTINAL ENDOSCOPY  2003   reflux esophagitis, erosive gastropathy, duodenal ulcer    Allergies Gabapentin, Metoprolol, Spironolactone, Amlodipine, Other, Rosiglitazone maleate, and Tricor [fenofibrate]  Social History Social History   Tobacco Use  . Smoking status: Former Smoker    Packs/day: 0.50    Years: 36.00  Pack years: 18.00    Types: Cigarettes    Quit date: 07/23/2013    Years since quitting: 6.0  . Smokeless tobacco: Never Used  Substance Use Topics  . Alcohol use: No  . Drug use: No   Review of Systems Constitutional: Negative for fever. Cardiovascular: Negative for chest pain. Respiratory: Positive for shortness of breath Gastrointestinal: Negative for abdominal pain, vomiting and diarrhea. Musculoskeletal: Positive for left hip pain Skin: Negative for rash. Neurological: Positive  for weakness  All systems negative/normal/unremarkable except as stated in the HPI  ____________________________________________   PHYSICAL EXAM:  VITAL SIGNS: ED Triage Vitals  Enc Vitals Group     BP      Pulse      Resp      Temp      Temp src      SpO2      Weight      Height      Head Circumference      Peak Flow      Pain Score      Pain Loc      Pain Edu?      Excl. in [place]?    Constitutional: Alert and oriented.  Chronically ill-appearing, mild distress Eyes: Conjunctivae are normal. Normal extraocular movements. ENT      Head: Normocephalic and atraumatic.      Nose: No congestion/rhinnorhea.      Mouth/Throat: Mucous membranes are moist.      Neck: No stridor. Cardiovascular: Normal rate, regular rhythm. No murmurs, rubs, or gallops. Respiratory: Normal respiratory effort without tachypnea nor retractions. Breath sounds are clear and equal bilaterally. No wheezes/rales/rhonchi. Gastrointestinal: Soft and nontender. Normal bowel sounds Musculoskeletal: Extensive pitting edema bilaterally, pain with range of motion of the lower extremities Neurologic:  Normal speech and language. No gross focal neurologic deficits are appreciated.  Skin: Extensive ecchymosis noted around the left hip Psychiatric: Mood and affect are normal.  ____________________________________________  EKG: Interpreted by me.  Atrial sensing ventricular paced rhythm with a rate of 83 bpm.  ____________________________________________  ED COURSE:  As part of my medical decision making, I reviewed the following data within the [place] History obtained from family if available, nursing notes, old chart and ekg, as well as notes from prior ED visits. Patient presented for weakness with recent fall, we will assess with labs and imaging as indicated at this time.   Procedures  Charles [name] was evaluated in Emergency Department on 08/10/2019 for the symptoms described in the  history of present illness. He was evaluated in the context of the global COVID-19 pandemic, which necessitated consideration that the patient might be at risk for infection with the SARS-CoV-2 virus that causes COVID-19. Institutional protocols and algorithms that pertain to the evaluation of patients at risk for COVID-19 are in a state of rapid change based on information released by regulatory bodies including the CDC and federal and state organizations. These policies and algorithms were followed during the patient's care in the ED.  ____________________________________________   LABS (pertinent positives/negatives)  Labs Reviewed  SARS CORONAVIRUS 2 (TAT 6-24 HRS)  CBC WITH DIFFERENTIAL/PLATELET  COMPREHENSIVE METABOLIC PANEL  URINALYSIS, COMPLETE (UACMP) WITH MICROSCOPIC  PROTIME-INR  CBG MONITORING, ED  TROPONIN I (HIGH SENSITIVITY)    RADIOLOGY Images were viewed by me  Chest x-ray, left hip x-ray IMPRESSION:  Significant diffuse osseous demineralization without acute bony  abnormalities.   Distal colonic diverticulosis without evidence of diverticulitis.   Tiny RIGHT  inguinal hernia containing fat.   No acute intrapelvic abnormalities.  CXR IMPRESSION:  1. Chronic low lung volumes with asymmetric elevation of the left  hemidiaphragm and left base atelectasis versus scar.  ____________________________________________   DIFFERENTIAL DIAGNOSIS   Diastolic heart failure, renal failure, dehydration, electrolyte abnormality, hip fracture, occult infection  FINAL ASSESSMENT AND PLAN  Peripheral edema, weakness, fall, left hip pain   Plan: The patient had presented for weakness with recent fall and left hip pain.  Family was also concerned about increasing edema. Patient's labs were at his baseline with chronic kidney disease and anemia.  Troponin is also chronically elevated. Patient's imaging not reveal any acute process, we obtained hip imaging with x-ray and then  further CT without any fracture identified.  He is likely bruised from his Eliquis.  He also wanted help with his peripheral edema, we gave him IV Lasix and he has diuresed some.  He is cleared for outpatient follow-up.  He states his son can help take care of him at home.   Laurence Aly, MD    Note: This note was generated in part or whole with voice recognition software. Voice recognition is usually quite accurate but there are transcription errors that can and very often do occur. I apologize for any typographical errors that were not detected and corrected.     Earleen Newport, MD 08/10/19 1341

## 2019-08-10 NOTE — ED Triage Notes (Signed)
Pt comes from home via [facility] c/o of weakness. EMS stated pt's family sauid ptd has had increasing edema in lower extremities. Pt stated he fell Monday and hasn't been able to walk with his walker. EMS got CBG of 399, pt has hx of diabetes.

## 2019-08-10 NOTE — Care Management (Signed)
ED RN CM: Left voicemail message for [facility] checking availability for PT.

## 2019-08-10 NOTE — ED Notes (Signed)
Pt cleaned of urine and brief placed. Family at bedside. No needs.

## 2019-08-10 NOTE — TOC Initial Note (Signed)
Transition of Care [facility]) - Initial/Assessment Note    Patient Details  Name: Charles [name] MRN: [id number] Date of Birth: Aug 08, 1932  Transition of [facility] CM/SW Contact:    Anselm Pancoast, RN Phone Number: 08/10/2019, 2:37 PM  Clinical Narrative:                 85 year old male admitted from home after sustaining fall resulting in left hip pain. Son is at bedside and has other family on the phone engaging in assessment. Discharge plan per son is for member to return home with strong family support, home health for therapy and another son moving in for short term. Patient is engaged and eager to return home with [facility]. Has walker and O2 at home already.   Expected Discharge Plan: [place] Barriers to Discharge: Continued Medical Work up   Patient Goals and CMS Choice Patient states their goals for this hospitalization and ongoing recovery are:: get home and get stronger      Expected Discharge Plan and Services Expected Discharge Plan: [place] In-house Referral: Clinical Social Work Discharge Planning Services: CM Consult   Living arrangements for the past 2 months: [place]                                      Prior Living Arrangements/Services Living arrangements for the past 2 months: Single Family Home Lives with:: Self Patient language and need for interpreter reviewed:: Yes Do you feel safe going back to the place where you live?: Yes      Need for Family Participation in Patient Care: Yes (Comment) Care giver support system in place?: Yes (comment) Current home services: DME Criminal Activity/Legal Involvement Pertinent to Current Situation/Hospitalization: No - Comment as needed  Activities of Daily Living      Permission Sought/Granted Permission sought to share information with : Case Manager Permission granted to share information with : Yes, Verbal Permission Granted  Share Information with  NAME: Son           Emotional Assessment Appearance:: Appears stated age Attitude/Demeanor/Rapport: Engaged Affect (typically observed): Accepting Orientation: : Oriented to Self, Oriented to Place, Oriented to  Time, Oriented to Situation   Psych Involvement: No (comment)  Admission diagnosis:  Weakness-EMS Patient Active Problem List   Diagnosis Date Noted  . Chronic respiratory failure ([place]) 07/19/2019  . AKI (acute kidney injury) ([place]) 05/25/2019  . CKD (chronic kidney disease), stage II 12/27/2018  . S/P TAVR (transcatheter aortic valve replacement)   . 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 Paroxysmal atrial flutter ([place])   . Scrotal swelling 02/16/2018  . Coronary artery disease involving native coronary artery of native heart without angina pectoris   . Acute on chronic diastolic CHF (congestive heart failure) ([place]) 09/06/2017  . Urinary urgency 07/01/2017  . Chronic anticoagulation 11/05/2016  . Chronic radicular pain of lower back 02/17/2016  . Aortic stenosis, severe 06/04/2014  . DDD (degenerative disc disease), lumbar 10/02/2013  . Ex-smoker   . Carotid arterial disease ([place]) 08/23/2013  . MDD (major depressive disorder), recurrent episode, moderate ([place]) 09/21/2007  . OSTEOARTHRITIS 09/21/2007  . COLONIC POLYPS 01/31/2007  . Diabetes mellitus type 2, uncontrolled, with complications ([place]) 99991111  . HYPERCHOLESTEROLEMIA 01/31/2007  . ERECTILE DYSFUNCTION 01/31/2007  . Essential hypertension 01/31/2007  . GERD 01/31/2007  .  PSORIASIS 01/31/2007   PCP:  [name], MD Pharmacy:   [place], [place] - [address] [place] [address] Phone: [phone] Fax: [phone]     Social Determinants of Health (SDOH) Interventions    Readmission Risk Interventions No flowsheet data found.

## 2019-08-10 NOTE — Care Management (Cosign Needed)
Patient suffers from chronic hip pain which impairs their ability to perform daily activities like feeding and bathing in the home.  A walker will not resolve  issue with performing activities of daily living. A wheelchair will allow patient to safely perform daily activities. Patient is not able to propel themselves in the home using a standard weight wheelchair due to severe weakness. Patient can self propel in the lightweight wheelchair.

## 2019-08-10 NOTE — Telephone Encounter (Signed)
Spoke with patient's daughter-in-law [name] regarding Palliative services and she was in agreement with this.  I have scheduled a Telephone Consult for 08/10/19 @ 2 PM

## 2019-08-10 NOTE — Care Management (Signed)
ED RN CM: Notified by nurse that patient is very weak and will need bedside commode and wheelchair at discharge. Was able to transfer to chair with assist x2.  ED RN CM: Outreach to [facility] [place] for [place] needed at discharge for physical therapy. Corene Cornea will check availability.  ED RN CM: Outreached for DME needs.

## 2019-08-10 NOTE — ED Notes (Signed)
Case management to bedside

## 2019-08-12 ENCOUNTER — Telehealth: Payer: Self-pay | Admitting: Cardiovascular Disease

## 2019-08-12 NOTE — Telephone Encounter (Signed)
Cardiology telephone note  The patient's daughter called due to worsening lower extremity and abdominal edema.  He went to the ED at [facility] on Friday for this, got IV Lasix and was sent home.  Since then symptoms have worsened and now his edema is worse than it was on Friday.  Has tried increasing his torsemide dose for 5 days, which did not help.  Denies any significant dyspnea.  Is on oxygen at baseline.  Has been otherwise stable other than worsening edema.  I advised the daughter that he will need to be evaluated in the emergency room and likely admitted for heart failure refractory to oral diuretics.  She is planning to bring him tomorrow.

## 2019-08-13 ENCOUNTER — Telehealth: Payer: Self-pay | Admitting: Primary Care

## 2019-08-13 ENCOUNTER — Emergency Department: Payer: Medicare Other

## 2019-08-13 ENCOUNTER — Inpatient Hospital Stay
Admission: EM | Admit: 2019-08-13 | Discharge: 2019-08-20 | DRG: 291 | Disposition: A | Discharge status: Skilled Nursing Facility | Payer: Medicare Other | Attending: Internal Medicine | Admitting: Internal Medicine

## 2019-08-13 ENCOUNTER — Encounter: Payer: Self-pay | Admitting: Intensive Care

## 2019-08-13 ENCOUNTER — Other Ambulatory Visit: Payer: Self-pay

## 2019-08-13 DIAGNOSIS — N183 Chronic kidney disease, stage 3 unspecified: Secondary | ICD-10-CM | POA: Diagnosis present

## 2019-08-13 DIAGNOSIS — I25118 Atherosclerotic heart disease of native coronary artery with other forms of angina pectoris: Secondary | ICD-10-CM | POA: Diagnosis present

## 2019-08-13 DIAGNOSIS — Z8249 Family history of ischemic heart disease and other diseases of the circulatory system: Secondary | ICD-10-CM

## 2019-08-13 DIAGNOSIS — M5116 Intervertebral disc disorders with radiculopathy, lumbar region: Secondary | ICD-10-CM | POA: Diagnosis present

## 2019-08-13 DIAGNOSIS — I5031 Acute diastolic (congestive) heart failure: Secondary | ICD-10-CM | POA: Diagnosis not present

## 2019-08-13 DIAGNOSIS — I10 Essential (primary) hypertension: Secondary | ICD-10-CM | POA: Diagnosis present

## 2019-08-13 DIAGNOSIS — N182 Chronic kidney disease, stage 2 (mild): Secondary | ICD-10-CM | POA: Diagnosis present

## 2019-08-13 DIAGNOSIS — Z9111 Patient's noncompliance with dietary regimen: Secondary | ICD-10-CM

## 2019-08-13 DIAGNOSIS — I701 Atherosclerosis of renal artery: Secondary | ICD-10-CM | POA: Diagnosis present

## 2019-08-13 DIAGNOSIS — F331 Major depressive disorder, recurrent, moderate: Secondary | ICD-10-CM | POA: Diagnosis present

## 2019-08-13 DIAGNOSIS — Z950 Presence of cardiac pacemaker: Secondary | ICD-10-CM

## 2019-08-13 DIAGNOSIS — M5416 Radiculopathy, lumbar region: Secondary | ICD-10-CM | POA: Diagnosis not present

## 2019-08-13 DIAGNOSIS — I441 Atrioventricular block, second degree: Secondary | ICD-10-CM | POA: Diagnosis present

## 2019-08-13 DIAGNOSIS — Z515 Encounter for palliative care: Secondary | ICD-10-CM | POA: Diagnosis present

## 2019-08-13 DIAGNOSIS — W19XXXA Unspecified fall, initial encounter: Secondary | ICD-10-CM | POA: Diagnosis present

## 2019-08-13 DIAGNOSIS — I5033 Acute on chronic diastolic (congestive) heart failure: Secondary | ICD-10-CM | POA: Diagnosis present

## 2019-08-13 DIAGNOSIS — Z888 Allergy status to other drugs, medicaments and biological substances status: Secondary | ICD-10-CM

## 2019-08-13 DIAGNOSIS — Z87891 Personal history of nicotine dependence: Secondary | ICD-10-CM

## 2019-08-13 DIAGNOSIS — E785 Hyperlipidemia, unspecified: Secondary | ICD-10-CM | POA: Diagnosis present

## 2019-08-13 DIAGNOSIS — Z951 Presence of aortocoronary bypass graft: Secondary | ICD-10-CM

## 2019-08-13 DIAGNOSIS — I130 Hypertensive heart and chronic kidney disease with heart failure and stage 1 through stage 4 chronic kidney disease, or unspecified chronic kidney disease: Secondary | ICD-10-CM | POA: Diagnosis present

## 2019-08-13 DIAGNOSIS — E860 Dehydration: Secondary | ICD-10-CM | POA: Diagnosis present

## 2019-08-13 DIAGNOSIS — IMO0002 Reserved for concepts with insufficient information to code with codable children: Secondary | ICD-10-CM | POA: Diagnosis present

## 2019-08-13 DIAGNOSIS — I4892 Unspecified atrial flutter: Secondary | ICD-10-CM | POA: Diagnosis present

## 2019-08-13 DIAGNOSIS — K5730 Diverticulosis of large intestine without perforation or abscess without bleeding: Secondary | ICD-10-CM | POA: Diagnosis present

## 2019-08-13 DIAGNOSIS — Z20828 Contact with and (suspected) exposure to other viral communicable diseases: Secondary | ICD-10-CM | POA: Diagnosis present

## 2019-08-13 DIAGNOSIS — I5043 Acute on chronic combined systolic (congestive) and diastolic (congestive) heart failure: Secondary | ICD-10-CM

## 2019-08-13 DIAGNOSIS — E1165 Type 2 diabetes mellitus with hyperglycemia: Secondary | ICD-10-CM | POA: Diagnosis present

## 2019-08-13 DIAGNOSIS — E1122 Type 2 diabetes mellitus with diabetic chronic kidney disease: Secondary | ICD-10-CM | POA: Diagnosis present

## 2019-08-13 DIAGNOSIS — I6529 Occlusion and stenosis of unspecified carotid artery: Secondary | ICD-10-CM

## 2019-08-13 DIAGNOSIS — R609 Edema, unspecified: Secondary | ICD-10-CM

## 2019-08-13 DIAGNOSIS — I5032 Chronic diastolic (congestive) heart failure: Secondary | ICD-10-CM | POA: Diagnosis present

## 2019-08-13 DIAGNOSIS — S7002XA Contusion of left hip, initial encounter: Secondary | ICD-10-CM | POA: Diagnosis present

## 2019-08-13 DIAGNOSIS — Z9842 Cataract extraction status, left eye: Secondary | ICD-10-CM

## 2019-08-13 DIAGNOSIS — I739 Peripheral vascular disease, unspecified: Secondary | ICD-10-CM | POA: Diagnosis not present

## 2019-08-13 DIAGNOSIS — J449 Chronic obstructive pulmonary disease, unspecified: Secondary | ICD-10-CM | POA: Diagnosis present

## 2019-08-13 DIAGNOSIS — I2510 Atherosclerotic heart disease of native coronary artery without angina pectoris: Secondary | ICD-10-CM | POA: Diagnosis not present

## 2019-08-13 DIAGNOSIS — Z9841 Cataract extraction status, right eye: Secondary | ICD-10-CM

## 2019-08-13 DIAGNOSIS — Z7901 Long term (current) use of anticoagulants: Secondary | ICD-10-CM | POA: Diagnosis not present

## 2019-08-13 DIAGNOSIS — Z66 Do not resuscitate: Secondary | ICD-10-CM | POA: Diagnosis present

## 2019-08-13 DIAGNOSIS — I350 Nonrheumatic aortic (valve) stenosis: Secondary | ICD-10-CM | POA: Diagnosis present

## 2019-08-13 DIAGNOSIS — J811 Chronic pulmonary edema: Secondary | ICD-10-CM | POA: Diagnosis not present

## 2019-08-13 DIAGNOSIS — E118 Type 2 diabetes mellitus with unspecified complications: Secondary | ICD-10-CM | POA: Diagnosis not present

## 2019-08-13 DIAGNOSIS — J9611 Chronic respiratory failure with hypoxia: Secondary | ICD-10-CM | POA: Diagnosis present

## 2019-08-13 DIAGNOSIS — E7800 Pure hypercholesterolemia, unspecified: Secondary | ICD-10-CM | POA: Diagnosis present

## 2019-08-13 DIAGNOSIS — I959 Hypotension, unspecified: Secondary | ICD-10-CM | POA: Diagnosis present

## 2019-08-13 DIAGNOSIS — Z961 Presence of intraocular lens: Secondary | ICD-10-CM | POA: Diagnosis present

## 2019-08-13 DIAGNOSIS — I779 Disorder of arteries and arterioles, unspecified: Secondary | ICD-10-CM | POA: Diagnosis present

## 2019-08-13 DIAGNOSIS — Z952 Presence of prosthetic heart valve: Secondary | ICD-10-CM | POA: Diagnosis not present

## 2019-08-13 DIAGNOSIS — R601 Generalized edema: Secondary | ICD-10-CM

## 2019-08-13 DIAGNOSIS — J432 Centrilobular emphysema: Secondary | ICD-10-CM | POA: Diagnosis not present

## 2019-08-13 DIAGNOSIS — Z953 Presence of xenogenic heart valve: Secondary | ICD-10-CM

## 2019-08-13 DIAGNOSIS — Z794 Long term (current) use of insulin: Secondary | ICD-10-CM

## 2019-08-13 DIAGNOSIS — G8929 Other chronic pain: Secondary | ICD-10-CM | POA: Diagnosis present

## 2019-08-13 DIAGNOSIS — Z79899 Other long term (current) drug therapy: Secondary | ICD-10-CM

## 2019-08-13 DIAGNOSIS — K219 Gastro-esophageal reflux disease without esophagitis: Secondary | ICD-10-CM | POA: Diagnosis not present

## 2019-08-13 DIAGNOSIS — Z86010 Personal history of colonic polyps: Secondary | ICD-10-CM

## 2019-08-13 DIAGNOSIS — Z7189 Other specified counseling: Secondary | ICD-10-CM | POA: Diagnosis not present

## 2019-08-13 LAB — CBC WITH DIFFERENTIAL/PLATELET
Abs Immature Granulocytes: 0.06 10*3/uL (ref 0.00–0.07)
Basophils Absolute: 0 10*3/uL (ref 0.0–0.1)
Basophils Relative: 1 %
Eosinophils Absolute: 0.2 10*3/uL (ref 0.0–0.5)
Eosinophils Relative: 3 %
HCT: 31.4 % — ABNORMAL LOW (ref 39.0–52.0)
Hemoglobin: 9.9 g/dL — ABNORMAL LOW (ref 13.0–17.0)
Immature Granulocytes: 1 %
Lymphocytes Relative: 13 %
Lymphs Abs: 0.7 10*3/uL (ref 0.7–4.0)
MCH: 29.6 pg (ref 26.0–34.0)
MCHC: 31.5 g/dL (ref 30.0–36.0)
MCV: 94 fL (ref 80.0–100.0)
Monocytes Absolute: 0.7 10*3/uL (ref 0.1–1.0)
Monocytes Relative: 12 %
Neutro Abs: 4.1 10*3/uL (ref 1.7–7.7)
Neutrophils Relative %: 70 %
Platelets: 168 10*3/uL (ref 150–400)
RBC: 3.34 MIL/uL — ABNORMAL LOW (ref 4.22–5.81)
RDW: 13.3 % (ref 11.5–15.5)
WBC: 5.8 10*3/uL (ref 4.0–10.5)
nRBC: 0 % (ref 0.0–0.2)

## 2019-08-13 LAB — COMPREHENSIVE METABOLIC PANEL
ALT: 16 U/L (ref 0–44)
AST: 18 U/L (ref 15–41)
Albumin: 3.7 g/dL (ref 3.5–5.0)
Alkaline Phosphatase: 81 U/L (ref 38–126)
Anion gap: 9 (ref 5–15)
BUN: 42 mg/dL — ABNORMAL HIGH (ref 8–23)
CO2: 31 mmol/L (ref 22–32)
Calcium: 9 mg/dL (ref 8.9–10.3)
Chloride: 98 mmol/L (ref 98–111)
Creatinine, Ser: 1.78 mg/dL — ABNORMAL HIGH (ref 0.61–1.24)
GFR calc Af Amer: 40 mL/min — ABNORMAL LOW (ref >60)
GFR calc non Af Amer: 34 mL/min — ABNORMAL LOW (ref >60)
Glucose, Bld: 251 mg/dL — ABNORMAL HIGH (ref 70–99)
Potassium: 4.7 mmol/L (ref 3.5–5.1)
Sodium: 138 mmol/L (ref 135–145)
Total Bilirubin: 0.9 mg/dL (ref 0.3–1.2)
Total Protein: 7.2 g/dL (ref 6.5–8.1)

## 2019-08-13 LAB — GLUCOSE, CAPILLARY: Glucose-Capillary: 225 mg/dL — ABNORMAL HIGH (ref 70–99)

## 2019-08-13 LAB — BRAIN NATRIURETIC PEPTIDE: B Natriuretic Peptide: 244 pg/mL — ABNORMAL HIGH (ref 0.0–100.0)

## 2019-08-13 LAB — TROPONIN I (HIGH SENSITIVITY)
Troponin I (High Sensitivity): 49 ng/L — ABNORMAL HIGH (ref <18)
Troponin I (High Sensitivity): 47 ng/L — ABNORMAL HIGH (ref <18)

## 2019-08-13 MED ORDER — CITALOPRAM HYDROBROMIDE 20 MG PO TABS
20.0000 mg | ORAL_TABLET | Freq: Every day | ORAL | Status: DC
  Administered 2019-08-14 – 2019-08-20 (×7): 20 mg via ORAL
  Filled 2019-08-13 (×7): qty 1

## 2019-08-13 MED ORDER — ENOXAPARIN SODIUM 40 MG/0.4ML ~~LOC~~ SOLN: 40.0000 mg | SUBCUTANEOUS | Status: DC

## 2019-08-13 MED ORDER — ATORVASTATIN CALCIUM 20 MG PO TABS
20.0000 mg | ORAL_TABLET | Freq: Every day | ORAL | Status: DC
  Administered 2019-08-13 – 2019-08-19 (×7): 20 mg via ORAL
  Filled 2019-08-13 (×7): qty 1

## 2019-08-13 MED ORDER — ONDANSETRON HCL 4 MG/2ML IJ SOLN: 4.0000 mg | Freq: Four times a day (QID) | INTRAMUSCULAR | Status: DC | PRN

## 2019-08-13 MED ORDER — SODIUM CHLORIDE 0.9 % IV SOLN: 250.0000 mL | INTRAVENOUS | Status: DC | PRN

## 2019-08-13 MED ORDER — TAMSULOSIN HCL 0.4 MG PO CAPS
0.4000 mg | ORAL_CAPSULE | Freq: Every day | ORAL | Status: DC
  Administered 2019-08-14 – 2019-08-19 (×7): 0.4 mg via ORAL
  Filled 2019-08-13 (×7): qty 1

## 2019-08-13 MED ORDER — FINASTERIDE 5 MG PO TABS
5.0000 mg | ORAL_TABLET | Freq: Every day | ORAL | Status: DC
  Administered 2019-08-14 – 2019-08-20 (×7): 5 mg via ORAL
  Filled 2019-08-13 (×7): qty 1

## 2019-08-13 MED ORDER — VITAMIN C 500 MG PO TABS
500.0000 mg | ORAL_TABLET | Freq: Every day | ORAL | Status: DC
  Administered 2019-08-14 – 2019-08-20 (×7): 500 mg via ORAL
  Filled 2019-08-13 (×7): qty 1

## 2019-08-13 MED ORDER — AMIODARONE HCL 200 MG PO TABS
200.0000 mg | ORAL_TABLET | Freq: Every day | ORAL | Status: DC
  Administered 2019-08-14 – 2019-08-20 (×7): 200 mg via ORAL
  Filled 2019-08-13 (×7): qty 1

## 2019-08-13 MED ORDER — FUROSEMIDE 10 MG/ML IJ SOLN
80.0000 mg | Freq: Once | INTRAMUSCULAR | Status: AC
  Administered 2019-08-13: 80 mg via INTRAVENOUS
  Filled 2019-08-13: qty 8

## 2019-08-13 MED ORDER — FUROSEMIDE 10 MG/ML IJ SOLN
60.0000 mg | Freq: Two times a day (BID) | INTRAMUSCULAR | Status: DC
  Administered 2019-08-14: 60 mg via INTRAVENOUS
  Filled 2019-08-13: qty 6

## 2019-08-13 MED ORDER — SODIUM CHLORIDE 0.9% FLUSH: 3.0000 mL | INTRAVENOUS | Status: DC | PRN

## 2019-08-13 MED ORDER — POTASSIUM CHLORIDE CRYS ER 20 MEQ PO TBCR
20.0000 meq | EXTENDED_RELEASE_TABLET | Freq: Every day | ORAL | Status: DC
  Filled 2019-08-13: qty 1

## 2019-08-13 MED ORDER — SODIUM CHLORIDE 0.9% FLUSH
3.0000 mL | Freq: Two times a day (BID) | INTRAVENOUS | Status: DC
  Administered 2019-08-13 – 2019-08-20 (×11): 3 mL via INTRAVENOUS

## 2019-08-13 MED ORDER — ACETAMINOPHEN 500 MG PO TABS
1000.0000 mg | ORAL_TABLET | Freq: Three times a day (TID) | ORAL | Status: DC | PRN
  Administered 2019-08-18: 1000 mg via ORAL
  Filled 2019-08-13: qty 2

## 2019-08-13 MED ORDER — LISINOPRIL 20 MG PO TABS
40.0000 mg | ORAL_TABLET | Freq: Every day | ORAL | Status: DC
  Filled 2019-08-13: qty 2

## 2019-08-13 MED ORDER — APIXABAN 2.5 MG PO TABS
2.5000 mg | ORAL_TABLET | Freq: Two times a day (BID) | ORAL | Status: DC
  Administered 2019-08-13 – 2019-08-20 (×14): 2.5 mg via ORAL
  Filled 2019-08-13 (×14): qty 1

## 2019-08-13 MED ORDER — ACETAMINOPHEN 325 MG PO TABS
650.0000 mg | ORAL_TABLET | ORAL | Status: DC | PRN
  Administered 2019-08-15 – 2019-08-20 (×5): 650 mg via ORAL
  Filled 2019-08-13 (×5): qty 2

## 2019-08-13 MED ORDER — INSULIN GLARGINE 100 UNIT/ML ~~LOC~~ SOLN
20.0000 [IU] | Freq: Two times a day (BID) | SUBCUTANEOUS | Status: DC
  Administered 2019-08-13 – 2019-08-14 (×3): 20 [IU] via SUBCUTANEOUS
  Filled 2019-08-13 (×5): qty 0.2

## 2019-08-13 MED ORDER — PANTOPRAZOLE SODIUM 40 MG PO TBEC
40.0000 mg | DELAYED_RELEASE_TABLET | Freq: Every day | ORAL | Status: DC
  Administered 2019-08-14 – 2019-08-20 (×7): 40 mg via ORAL
  Filled 2019-08-13 (×7): qty 1

## 2019-08-13 MED ORDER — TRAZODONE HCL 50 MG PO TABS
50.0000 mg | ORAL_TABLET | Freq: Every day | ORAL | Status: DC
  Administered 2019-08-13 – 2019-08-19 (×7): 50 mg via ORAL
  Filled 2019-08-13 (×7): qty 1

## 2019-08-13 NOTE — ED Notes (Signed)
Pt soiled linen and clothing changed and patient cleaned up, provided dinner tray

## 2019-08-13 NOTE — ED Notes (Signed)
Attempted to call report but was told the nurse would have to call me back

## 2019-08-13 NOTE — ED Notes (Signed)
Report given to receiving RN.

## 2019-08-13 NOTE — ED Triage Notes (Signed)
Pt comes into the ED via EMS from with c/o SOB, hx of CHF, swelling in LE, left hip pain was seen recently for. VSS, 96% 2L [place] continuous at home.

## 2019-08-13 NOTE — ED Notes (Signed)
Light green tube sent to lab 

## 2019-08-13 NOTE — ED Provider Notes (Signed)
[facility] Emergency Department Provider Note ____________________________________________   First MD Initiated Contact with Patient 08/13/19 1526     (approximate)  I have reviewed the triage vital signs and the nursing notes.  HISTORY  Chief Complaint Leg Swelling (left and right)  HPI Charles [name] is a 85 y.o. male here for evaluation of edema in both lower legs and swelling in his abdomen  Patient reports that he came to the ER few days ago, also spoke with cardiology, and he is increased his dose of his torsemide.  Continues to have increased swelling in both lower legs and his abdomen.  No shortness of breath no cough no fever.  Denies pain except for achiness in his right hip when he tries to walk, reports that happened after a fall over a week ago.   History of CHF.  Reports he sees cardiology including Dr. Alvester Chou   Past Medical History:  Diagnosis Date  . Abdominal aortic atherosclerosis ([place])    by xray  . Arthritis    in lower back (06/24/2015)  . Atrial flutter ([place])    notes 06/24/2015  . AV block, [place] 1 09/06/2017   Archie Endo 09/06/2017  . 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 CAD (coronary artery disease) 2004   a. s/p CABG in 2004 with LIMA-LAD, SVG-RCA, SVG-OM, and SVG-RI  . Carotid stenosis 1999   s/p L CEA  . Chronic diastolic CHF (congestive heart failure) ([place])   . Colon polyp 2005   (Dr. Tiffany Kocher)  . Compression fracture of L1 lumbar vertebra (HCC) remote  . COPD (chronic obstructive pulmonary disease) ([place]) 03/2011   by xray  . Depression    hx  . GERD (gastroesophageal reflux disease) 2003   h/o duodenal ulcer per EGD as well as esophagitis  . History of colon polyps 2003, 2005   adenomatous Vira Agar)  . History of tobacco abuse   . HLD (hyperlipidemia)   . HTN (hypertension)   . Presence of permanent cardiac pacemaker   . Psoriasis   . Renal artery stenosis ([place]) 2004   70% bilateral,  followed by cards  . 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 Severe aortic stenosis   . T2DM (type 2 diabetes mellitus) ([place]) 1995  . Urge incontinence of urine     Patient Active Problem List   Diagnosis Date Noted  . Chronic respiratory failure ([place]) 07/19/2019  . AKI (acute kidney injury) ([place]) 05/25/2019  . CKD (chronic kidney disease), stage II 12/27/2018  . S/P TAVR (transcatheter aortic valve replacement)   . 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 Paroxysmal atrial flutter ([place])   . Scrotal swelling 02/16/2018  . Coronary artery disease involving native coronary artery of native heart without angina pectoris   . Acute on chronic diastolic CHF (congestive heart failure) ([place]) 09/06/2017  . Urinary urgency 07/01/2017  . Chronic anticoagulation 11/05/2016  . Chronic radicular pain of lower back 02/17/2016  . Aortic stenosis, severe 06/04/2014  . DDD (degenerative disc disease), lumbar 10/02/2013  . Ex-smoker   . Carotid arterial disease ([place]) 08/23/2013  . MDD (major depressive disorder), recurrent episode, moderate ([place]) 09/21/2007  . OSTEOARTHRITIS 09/21/2007  . COLONIC POLYPS 01/31/2007  . Diabetes mellitus type 2, uncontrolled, with complications ([place]) [id number]  . HYPERCHOLESTEROLEMIA 01/31/2007  . ERECTILE DYSFUNCTION  01/31/2007  . Essential hypertension 01/31/2007  . GERD 01/31/2007  . PSORIASIS 01/31/2007    Past Surgical History:  Procedure Laterality Date  . CARDIAC CATHETERIZATION  03/06/2003   No intervention - recommend CABG  . CARDIOVASCULAR STRESS TEST  11/2010   normal perfusion, no evidence of ischemia, EF 62% post exercise  . CARDIOVASCULAR STRESS TEST  11/28/2012   Mild diaphragmatic attenuation; cannot exclude a focal region of nontransmural inferior scar  . CARDIOVERSION N/A 06/25/2015   Procedure:  CARDIOVERSION;  Surgeon: Pixie Casino, MD;  Location: [facility] ENDOSCOPY;  Service: Cardiovascular;  Laterality: N/A;  . CARDIOVERSION N/A 11/02/2016   Procedure: CARDIOVERSION;  Surgeon: [name], MD;  Location: [place];  Service: Cardiovascular;  Laterality: N/A;  . CARDIOVERSION N/A 03/20/2019   Procedure: CARDIOVERSION;  Surgeon: Jerline Pain, MD;  Location: [place];  Service: Cardiovascular;  Laterality: N/A;  . CAROTID ENDARTERECTOMY Left 1999   ([place])  . CATARACT EXTRACTION W/ INTRAOCULAR LENS  IMPLANT, BILATERAL Bilateral 01/2013   [place]  . COLONOSCOPY  2003   colon polyp x3 - adenomatous Tiffany Kocher)  . COLONOSCOPY  10/08/2012   2 TA, diverticulosis, int hem, no rpt rec Tiffany Kocher)  . CORONARY ANGIOPLASTY    . CORONARY ARTERY BYPASS GRAFT  03/07/2003   4v CABG ([place]) with LIMA to LAD, vein graft to RCA, 1st obtuse marginal, and ramus intermedius  . ESOPHAGOGASTRODUODENOSCOPY  10/08/2012   nl esophagus, duodenitis and erosive gastropathy, path - gastropathy no Hpylori, no rpt rec  . LUMBAR EPIDURAL INJECTION  03/2017   L5/S1 ([place])  . PACEMAKER IMPLANT N/A 04/21/2018   Procedure: PACEMAKER IMPLANT -- Dual Chamber;  Surgeon: Deboraha Sprang, MD;  Location: [place] CV LAB;  Service: Cardiovascular;  Laterality: N/A;  . PERIPHERAL VASCULAR CATHETERIZATION N/A 02/02/2016   Procedure: Renal Angiography;  Surgeon: [name], MD;  Location: [place] CV LAB;  Service: Cardiovascular;  Laterality: N/A;  . PERIPHERAL VASCULAR CATHETERIZATION N/A 02/02/2016   Procedure: Abdominal Aortogram;  Surgeon: [name], MD;  Location: [place] CV LAB;  Service: Cardiovascular;  Laterality: N/A;  . RENAL DOPPLER  11/29/2011   Celiac&SMA-demonstrated vessel narrowing suggestive of a greater than 50% diameter reduction. Bilateral renal arteries-demonstrated vessel narrowing of 60-99% diameter reduction. Rt Kidney-mid pole lateral simple cyst noted measuring 1.29x0.76x1.11cm and  exophytic cyst outside lower pole measuring 1.23x0.96x1.31. Lft Kidney-lateral mid to lower pole simple cyst measuering-1.24x9.83x1.24  . RIGHT/LEFT HEART CATH AND CORONARY/GRAFT ANGIOGRAPHY N/A 10/20/2017   Procedure: RIGHT/LEFT HEART CATH AND CORONARY/GRAFT ANGIOGRAPHY;  Surgeon: Burnell Blanks, MD;  Location: [place] CV LAB;  Service: Cardiovascular;  Laterality: N/A;  . TEE WITHOUT CARDIOVERSION N/A 06/25/2015   Procedure: TRANSESOPHAGEAL ECHOCARDIOGRAM (TEE);  Surgeon: Pixie Casino, MD;  Location[facility] ENDOSCOPY;  Service: Cardiovascular;  Laterality: N/A;  . TEE WITHOUT CARDIOVERSION N/A 11/02/2016   Procedure: TRANSESOPHAGEAL ECHOCARDIOGRAM (TEE);  Surgeon: [name], MD;  Location: [place];  Service: Cardiovascular;  Laterality: N/A;  . TEE WITHOUT CARDIOVERSION N/A 12/05/2018   Procedure: TRANSESOPHAGEAL ECHOCARDIOGRAM (TEE);  Surgeon: Sherren Mocha, MD;  Location: [place];  Service: Open Heart Surgery;  Laterality: N/A;  . TONSILLECTOMY    . TRANSCATHETER AORTIC VALVE REPLACEMENT, TRANSFEMORAL N/A 12/05/2018   Procedure: TRANSCATHETER AORTIC VALVE REPLACEMENT, TRANSFEMORAL;  Surgeon: Sherren Mocha, MD;  Location: [place];  Service: Open Heart Surgery;  Laterality: N/A;  . UPPER GASTROINTESTINAL ENDOSCOPY  2003   reflux esophagitis, erosive gastropathy, duodenal ulcer    Prior to  Admission medications   Medication Sig Start Date End Date Taking? Authorizing Provider  acetaminophen (TYLENOL) 500 MG tablet Take 1 tablet (500 mg total) by mouth 3 (three) times daily. Patient taking differently: Take 1,000 mg by mouth every 8 (eight) hours as needed for mild pain or headache.  02/17/16   [name], MD  amiodarone (PACERONE) 200 MG tablet Take 400mg , 2 tabs, 2 times daily for 2wks; Then reduce 400mg , 2 tabs, 1 time daily for 2wks; then begin 200mg , 1 tablet, once daily. Patient taking differently: Take 200-400 mg by mouth See admin instructions. Take 400 mg by mouth  twice daily for 2 weeks, then 400 mg daily for 2 weeks then 200 mg daily 06/22/19   Deboraha Sprang, MD  apixaban (ELIQUIS) 2.5 MG TABS tablet Take 1 tablet (2.5 mg total) by mouth 2 (two) times daily. 06/29/19   [name], MD  Ascorbic Acid (VITAMIN C PO) Take 1 tablet by mouth daily.    [provider]  atorvastatin (LIPITOR) 20 MG tablet Take 1 tablet (20 mg total) by mouth daily. 08/01/18   [name], MD  BD INSULIN SYRINGE U/F 30G X 1/2" 0.5 ML MISC AS DIRECTED. 03/06/19   [name], MD  citalopram (CELEXA) 20 MG tablet Take 1 tablet (20 mg total) by mouth daily. 07/16/19   [name], MD  finasteride (PROSCAR) 5 MG tablet Take 1 tablet (5 mg total) by mouth daily. 04/03/19   [name], MD  glucose blood (ACCU-CHEK AVIVA PLUS) test strip Use as instructed to check blood sugar three times a day. Dx code E11.8 & E11.65 04/10/19   [name], MD  guaiFENesin (MUCINEX) 600 MG 12 hr tablet Take 600 mg by mouth 2 (two) times daily as needed for cough or to loosen phlegm.    [provider]  insulin glargine (LANTUS) 100 UNIT/ML injection Inject 0.25 mLs (25 Units total) into the skin 2 (two) times daily. Patient taking differently: Inject 20 Units into the skin 2 (two) times daily.  05/30/19   [name], MD  lisinopril (PRINIVIL,ZESTRIL) 40 MG tablet Take 1 tablet (40 mg total) by mouth daily. 12/14/18 12/09/19  Eileen Stanford, PA-C  metFORMIN (GLUCOPHAGE) 500 MG tablet Take 1 tablet (500 mg total) by mouth 2 (two) times daily with a meal. 05/30/19   [name], MD  nitroGLYCERIN (NITROSTAT) 0.4 MG SL tablet Place 1 tablet (0.4 mg total) under the tongue every 5 (five) minutes as needed for chest pain. 03/08/18   Almyra Deforest, PA  pantoprazole (PROTONIX) 20 MG tablet Take 1 tablet (20 mg total) by mouth daily. 02/28/19   [name], MD  potassium chloride SA (K-DUR) 20 MEQ tablet Take 1 tablet (20 mEq total) every Monday,  Wednesday, Friday, Saturday, and Sunday. Patient taking differently: Take 20 mEq by mouth See admin instructions. Take 1 tablet (20 mEq total) every Monday, Wednesday, Friday, Saturday, and Sunday. 04/09/19   [name], MD  tamsulosin (FLOMAX) 0.4 MG CAPS capsule Take 1 capsule (0.4 mg total) by mouth daily after supper. 04/09/19   [name], MD  torsemide (DEMADEX) 20 MG tablet Take 3 tablets (60 mg total) by mouth daily. 07/24/19   Lendon Colonel, NP  traZODone (DESYREL) 50 MG tablet Take 2 tablets (100 mg total) by mouth at bedtime. 07/16/19   [name], MD    Allergies Gabapentin, Metoprolol, Spironolactone, Amlodipine, Other, Rosiglitazone maleate, and Tricor [fenofibrate]  Family History  Problem Relation Age of Onset  .  Hypertension Father   . Heart disease Father   . Cancer Mother        GI cancer  . Diabetes Sister   . Heart disease Sister   . Stroke Sister   . Hypertension Sister   . Cancer Brother        Lung cancer - nonsmoker  . Cancer Maternal Grandmother   . Heart attack Maternal Grandfather   . Heart attack Paternal Grandmother   . Heart attack Paternal Grandfather   . Cancer Brother        Pancreatic cancer  . Lung disease Brother     Social History Social History   Tobacco Use  . Smoking status: Former Smoker    Packs/day: 0.50    Years: 36.00    Pack years: 18.00    Types: Cigarettes    Quit date: 07/23/2013    Years since quitting: 6.0  . Smokeless tobacco: Never Used  Substance Use Topics  . Alcohol use: No  . Drug use: No    Review of Systems Constitutional: No fever/chills Eyes: No visual changes. ENT: No sore throat. Cardiovascular: Denies chest pain. Respiratory: Denies shortness of breath. Gastrointestinal: No abdominal pain.   Genitourinary: Negative for dysuria.  Continues to urinate. Musculoskeletal: Negative for back pain.  Swelling both lower legs. Skin: Negative for rash. Neurological: Negative for  headaches, areas of focal weakness or numbness.    ____________________________________________   PHYSICAL EXAM:  VITAL SIGNS: ED Triage Vitals  Enc Vitals Group     BP 08/13/19 1228 (!) 103/54     Pulse Rate 08/13/19 1228 71     Resp 08/13/19 1228 16     Temp 08/13/19 1228 98.3 F (36.8 C)     Temp Source 08/13/19 1228 Oral     SpO2 08/13/19 1228 95 %     Weight 08/13/19 1229 225 lb (102.1 kg)     Height 08/13/19 1229 6\' 1"  (1.854 m)     Head Circumference --      Peak Flow --      Pain Score 08/13/19 1229 0     Pain Loc --      Pain Edu? --      Excl. in [place]? --    Constitutional: Alert and oriented. Well appearing and in no acute distress. Eyes: Conjunctivae are normal. Head: Atraumatic. Nose: No congestion/rhinnorhea. Mouth/Throat: Mucous membranes are moist. Neck: No stridor.  Cardiovascular: Normal rate, regular rhythm. Grossly normal heart sounds.  Good peripheral circulation. Respiratory: Normal respiratory effort.  No retractions. Lungs CTAB. Gastrointestinal: Soft and nontender. No distention. Musculoskeletal: Bilateral lower extremity edema, moderate to severe lower extremity pitting edema bilateral, some lower abdominal wall edema as well noted. Neurologic:  Normal speech and language. No gross focal neurologic deficits are appreciated.  Skin:  Skin is warm, dry and intact. No rash noted. Psychiatric: Mood and affect are normal. Speech and behavior are normal.  ____________________________________________   LABS (all labs ordered are listed, but only abnormal results are displayed)  Labs Reviewed  CBC WITH DIFFERENTIAL/PLATELET - Abnormal; Notable for the following components:      Result Value   RBC 3.34 (*)    Hemoglobin 9.9 (*)    HCT 31.4 (*)    All other components within normal limits  COMPREHENSIVE METABOLIC PANEL - Abnormal; Notable for the following components:   Glucose, Bld 251 (*)    BUN 42 (*)    Creatinine, Ser 1.78 (*)    GFR calc  non Af Amer 34 (*)    GFR calc Af Amer 40 (*)    All other components within normal limits  BRAIN NATRIURETIC PEPTIDE - Abnormal; Notable for the following components:   B Natriuretic Peptide 244.0 (*)    All other components within normal limits  TROPONIN I (HIGH SENSITIVITY) - Abnormal; Notable for the following components:   Troponin I (High Sensitivity) 49 (*)    All other components within normal limits  TROPONIN I (HIGH SENSITIVITY) - Abnormal; Notable for the following components:   Troponin I (High Sensitivity) 47 (*)    All other components within normal limits  SARS CORONAVIRUS 2 (TAT 6-24 HRS)   ____________________________________________  EKG  Reviewed entered by me at 1235 Heart rate 70 QRS 200 QTc 450 Ventricular pacing ____________________________________________  RADIOLOGY  Dg Chest 2 View  Result Date: 08/13/2019 CLINICAL DATA:  Shortness of breath. EXAM: CHEST - 2 VIEW COMPARISON:  August 10, 2019. FINDINGS: Stable cardiomediastinal silhouette. Atherosclerosis of thoracic aorta is noted. Left-sided pacemaker is unchanged in position. Status post coronary bypass graft and transcatheter aortic valve repair. No pneumothorax or pleural effusion is noted. Right lung is clear. Stable elevated left hemidiaphragm is noted with mild left basilar atelectasis. IMPRESSION: Stable elevated left hemidiaphragm with mild left basilar subsegmental atelectasis. Aortic Atherosclerosis (ICD10-I70.0). Electronically Signed   By: Marijo Conception M.D.   On: 08/13/2019 13:30    Imaging reviewed negative for acute ____________________________________________   PROCEDURES  Procedure(s) performed: None  Procedures  Critical Care performed: No  ____________________________________________   INITIAL IMPRESSION / ASSESSMENT AND PLAN / ED COURSE  Pertinent labs & imaging results that were available during my care of the patient were reviewed by me and considered in my medical  decision making (see chart for details).   Patient presents with increasing edema despite treatment with increased outpatient diuretics as well as a IV diuresis several days ago.  He reports increasing edema and worsening symptoms including fatigue.  He does not show evidence of acute respiratory distress, his oxygen saturation is in the mid 90s on is not had to increase his oxygen level.  I discussed with the patient, we considered potential for outpatient treatment, but the patient feels that he would benefit from coming into the hospitalist outpatient medicines are not helping him and his symptoms are worsening.  Also discussed with his cardiologist, Dr. Fletcher Anon, I will provide IUD IV diuresis here and have consulted the hospitalist for evaluation for admission due to worsening symptoms of CHF   Differential diagnosis of dyspnea includes CHF, pneumonia, infectious etiologies, reactive airway disease, but based on the patient's clinical history he denies any chest pain I doubt ACS, he also denies unilateral edema, his symptoms seem to suggest worsening CHF with anasarca by exam.  [name] was evaluated in Emergency Department on 08/13/2019 for the symptoms described in the history of present illness. He was evaluated in the context of the global COVID-19 pandemic, which necessitated consideration that the patient might be at risk for infection with the SARS-CoV-2 virus that causes COVID-19. Institutional protocols and algorithms that pertain to the evaluation of patients at risk for COVID-19 are in a state of rapid change based on information released by regulatory bodies including the CDC and federal and state organizations. These policies and algorithms were followed during the patient's care in the ED.   No noted Covid risk factors.  ----------------------------------------- 4:11 PM on 08/13/2019 -----------------------------------------  Admission discussed with Dr. Benny Lennert  ____________________________________________   FINAL CLINICAL IMPRESSION(S) / ED DIAGNOSES  Final diagnoses:  Peripheral edema  Anasarca        Note:  This document was prepared using Dragon voice recognition software and may include unintentional dictation errors       Delman Kitten, MD 08/13/19 1612

## 2019-08-13 NOTE — Telephone Encounter (Signed)
Called patient's daughter-in-law, [name] back to reschedule the Palliative Consult that was scheduled for 11/6, [name] stated that patient was sent back to [facility] ED this morning due to swelling.  Hospital liaison and NP notified.

## 2019-08-13 NOTE — H&P (Signed)
Charles [name] is an 85 y.o. male.   Chief Complaint: Orthopnea, lower extremity edema, increasing abdominal girth HPI: The patient is a 85 yr old man who carries a past medial history significant for CHF, AAA, Atrial flutter, CAD,Carotid stenosis, Chronic diastolic CHF, COPD, L1 Compression fracture, GERD, Depression, Hyperlipidemia, hypertension, Pacemaker, S/P TAVR for severe aortic stenosis, DM II, AB Block Mobitz 1.  The patient presents to the ED today after having been in the ED last Friday for the same complaints, although he states that they weren't as severe at that time. He was given a one time injection of lasix in the ED and he was sent home on torsemide. He states that his swelling has "doubled" since that time, despite taking an increased dose of the torsemide as directed by cardiology.  He denies fevers, chills, cough, wheezing, nausea, vomiting, chest pain, constipation or diarrhea. He fell once a week ago. He is feeling weak.   In the ED he is found to be saturating in the mid-nineties on 2 Liters which is his baseline oxygen requirement. Creatinine appears to be at about baseline at 1.78. BNP is 244 . This is increased only slightly over the last BNP which was drawn on 05/24/2019. He is afebrile, somewhat hypotensive with a systolic blood pressure of 103/54. Pulse has varied between 71 and 140 as recorded (?). His glucose is 251. EKG demonstrates a ventricularly paced rhythm.   [facility] have been consulted to admit the patient for further evaluation and care. Cardiology [facility]) has been consulted by the ED. Echocardiogram has been ordered. He has received 80 mg lasix IV. He will be admitted to a telemetry bed.  Past Medical History:  Diagnosis Date  . Abdominal aortic atherosclerosis ([place])    by xray  . Arthritis    in lower back (06/24/2015)  . Atrial flutter ([place])    notes 06/24/2015  . AV block, Mobitz 1 09/06/2017   Archie Endo 09/06/2017  . 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 CAD (coronary artery disease) 2004   a. s/p CABG in 2004 with LIMA-LAD, SVG-RCA, SVG-OM, and SVG-RI  . Carotid stenosis 1999   s/p L CEA  . Chronic diastolic CHF (congestive heart failure) ([place])   . Colon polyp 2005   (Dr. Tiffany Kocher)  . Compression fracture of L1 lumbar vertebra (HCC) remote  . COPD (chronic obstructive pulmonary disease) ([place]) 03/2011   by xray  . Depression    hx  . GERD (gastroesophageal reflux disease) 2003   h/o duodenal ulcer per EGD as well as esophagitis  . History of colon polyps 2003, 2005   adenomatous Vira Agar)  . History of tobacco abuse   . HLD (hyperlipidemia)   . HTN (hypertension)   . Presence of permanent cardiac pacemaker   . Psoriasis   . Renal artery stenosis ([place]) 2004   70% bilateral, followed by cards  . 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 Severe aortic stenosis   . T2DM (type 2 diabetes mellitus) ([place]) 1995  . Urge incontinence of urine     Past Surgical History:  Procedure Laterality Date  . CARDIAC CATHETERIZATION  03/06/2003   No intervention - recommend CABG  . CARDIOVASCULAR STRESS TEST  11/2010   normal perfusion, no evidence of ischemia, EF 62% post exercise  . CARDIOVASCULAR STRESS TEST  11/28/2012  Mild diaphragmatic attenuation; cannot exclude a focal region of nontransmural inferior scar  . CARDIOVERSION N/A 06/25/2015   Procedure: CARDIOVERSION;  Surgeon: Pixie Casino, MD;  Location: [facility] ENDOSCOPY;  Service: Cardiovascular;  Laterality: N/A;  . CARDIOVERSION N/A 11/02/2016   Procedure: CARDIOVERSION;  Surgeon: [name], MD;  Location: [place];  Service: Cardiovascular;  Laterality: N/A;  . CARDIOVERSION N/A 03/20/2019   Procedure: CARDIOVERSION;  Surgeon: Jerline Pain, MD;  Location: [place];  Service: Cardiovascular;  Laterality: N/A;  . CAROTID  ENDARTERECTOMY Left 1999   ([place])  . CATARACT EXTRACTION W/ INTRAOCULAR LENS  IMPLANT, BILATERAL Bilateral 01/2013   [place]  . COLONOSCOPY  2003   colon polyp x3 - adenomatous Tiffany Kocher)  . COLONOSCOPY  10/08/2012   2 TA, diverticulosis, int hem, no rpt rec Tiffany Kocher)  . CORONARY ANGIOPLASTY    . CORONARY ARTERY BYPASS GRAFT  03/07/2003   4v CABG ([place]) with LIMA to LAD, vein graft to RCA, 1st obtuse marginal, and ramus intermedius  . ESOPHAGOGASTRODUODENOSCOPY  10/08/2012   nl esophagus, duodenitis and erosive gastropathy, path - gastropathy no Hpylori, no rpt rec  . LUMBAR EPIDURAL INJECTION  03/2017   L5/S1 ([place])  . PACEMAKER IMPLANT N/A 04/21/2018   Procedure: PACEMAKER IMPLANT -- Dual Chamber;  Surgeon: Deboraha Sprang, MD;  Location: [place] CV LAB;  Service: Cardiovascular;  Laterality: N/A;  . PERIPHERAL VASCULAR CATHETERIZATION N/A 02/02/2016   Procedure: Renal Angiography;  Surgeon: [name], MD;  Location: [place] [facility];  Service: Cardiovascular;  Laterality: N/A;  . PERIPHERAL VASCULAR CATHETERIZATION N/A 02/02/2016   Procedure: Abdominal Aortogram;  Surgeon: [name], MD;  Location: [place] CV LAB;  Service: Cardiovascular;  Laterality: N/A;  . RENAL DOPPLER  11/29/2011   Celiac&SMA-demonstrated vessel narrowing suggestive of a greater than 50% diameter reduction. Bilateral renal arteries-demonstrated vessel narrowing of 60-99% diameter reduction. Rt Kidney-mid pole lateral simple cyst noted measuring 1.29x0.76x1.11cm and exophytic cyst outside lower pole measuring 1.23x0.96x1.31. Lft Kidney-lateral mid to lower pole simple cyst measuering-1.24x9.83x1.24  . RIGHT/LEFT HEART CATH AND CORONARY/GRAFT ANGIOGRAPHY N/A 10/20/2017   Procedure: RIGHT/LEFT HEART CATH AND CORONARY/GRAFT ANGIOGRAPHY;  Surgeon: Burnell Blanks, MD;  Location: [place] CV [facility];  Service: Cardiovascular;  Laterality: N/A;  . TEE WITHOUT CARDIOVERSION N/A 06/25/2015   Procedure:  TRANSESOPHAGEAL ECHOCARDIOGRAM (TEE);  Surgeon: Pixie Casino, MD;  Location: [facility] ENDOSCOPY;  Service: Cardiovascular;  Laterality: N/A;  . TEE WITHOUT CARDIOVERSION N/A 11/02/2016   Procedure: TRANSESOPHAGEAL ECHOCARDIOGRAM (TEE);  Surgeon: [name], MD;  Location: [place];  Service: Cardiovascular;  Laterality: N/A;  . TEE WITHOUT CARDIOVERSION N/A 12/05/2018   Procedure: TRANSESOPHAGEAL ECHOCARDIOGRAM (TEE);  Surgeon: Sherren Mocha, MD;  Location: [place];  Service: Open Heart Surgery;  Laterality: N/A;  . TONSILLECTOMY    . TRANSCATHETER AORTIC VALVE REPLACEMENT, TRANSFEMORAL N/A 12/05/2018   Procedure: TRANSCATHETER AORTIC VALVE REPLACEMENT, TRANSFEMORAL;  Surgeon: Sherren Mocha, MD;  Location: [place];  Service: Open Heart Surgery;  Laterality: N/A;  . UPPER GASTROINTESTINAL ENDOSCOPY  2003   reflux esophagitis, erosive gastropathy, duodenal ulcer    Family History  Problem Relation Age of Onset  . Hypertension Father   . Heart disease Father   . Cancer Mother        GI cancer  . Diabetes Sister   . Heart disease Sister   . Stroke Sister   . Hypertension Sister   . Cancer Brother        Lung cancer -  nonsmoker  . Cancer Maternal Grandmother   . Heart attack Maternal Grandfather   . Heart attack Paternal Grandmother   . Heart attack Paternal Grandfather   . Cancer Brother        Pancreatic cancer  . Lung disease Brother    Social History:  reports that he quit smoking about 6 years ago. His smoking use included cigarettes. He has a 18.00 pack-year smoking history. He has never used smokeless tobacco. He reports that he does not drink alcohol or use drugs. (Not in a hospital admission)   Allergies:  Allergies  Allergen Reactions  . Gabapentin Other (See Comments)    Leg pain, weakness   . Metoprolol Swelling  . Spironolactone Other (See Comments)    Painful gynecomastia  . Amlodipine Other (See Comments)    Edema  . Other Other (See Comments)    Horse serum  - myalgias  . Rosiglitazone Maleate Other (See Comments)     Did not help  . Tricor [Fenofibrate] Other (See Comments)    myalgias    Pertinent items noted in HPI and remainder of comprehensive ROS otherwise negative.   General appearance: alert, cooperative, fatigued, mild distress and morbidly obese Head: Normocephalic, without obvious abnormality, atraumatic Eyes: conjunctivae/corneas clear. PERRL, EOM's intact. Fundi benign. Throat: lips, mucosa, and tongue normal; teeth and gums normal Neck: no adenopathy, no carotid bruit, no JVD, supple, symmetrical, trachea midline and thyroid not enlarged, symmetric, no tenderness/mass/nodules Resp: Diminished breath sounds bilaterally. Mildly increased work of breathing. No wheezes, rhonchi, or rales are auscultated. Chest wall: no tenderness Cardio: regular rate and rhythm, S1, S2 normal, no murmur, click, rub or gallop GI: Distended. Positive for body wall edema. Positive for tympany with percussion. Hypoactive bowel sounds. Non-tender. Extremities: edema 3+ bilaterally and No cyanosis or clubbing Pulses: 2+ and symmetric Skin: Skin color, texture, turgor normal. No rashes or lesions Lymph nodes: Cervical, supraclavicular, and axillary nodes normal. Neurologic: Grossly normal  Results for orders placed or performed during the hospital encounter of 08/13/19 (from the past 48 hour(s))  CBC with Differential     Status: Abnormal   Collection Time: 08/13/19 12:32 PM  Result Value Ref Range   WBC 5.8 4.0 - 10.5 K/uL   RBC 3.34 (L) 4.22 - 5.81 MIL/uL   Hemoglobin 9.9 (L) 13.0 - 17.0 g/dL   HCT 31.4 (L) 39.0 - 52.0 %   MCV 94.0 80.0 - 100.0 fL   MCH 29.6 26.0 - 34.0 pg   MCHC 31.5 30.0 - 36.0 g/dL   RDW 13.3 11.5 - 15.5 %   Platelets 168 150 - 400 K/uL   nRBC 0.0 0.0 - 0.2 %   Neutrophils Relative % 70 %   Neutro Abs 4.1 1.7 - 7.7 K/uL   Lymphocytes Relative 13 %   Lymphs Abs 0.7 0.7 - 4.0 K/uL   Monocytes Relative 12 %   Monocytes  Absolute 0.7 0.1 - 1.0 K/uL   Eosinophils Relative 3 %   Eosinophils Absolute 0.2 0.0 - 0.5 K/uL   Basophils Relative 1 %   Basophils Absolute 0.0 0.0 - 0.1 K/uL   Immature Granulocytes 1 %   Abs Immature Granulocytes 0.06 0.00 - 0.07 K/uL    Comment: Performed at [facility], [address] [place], [place] [address]  Comprehensive metabolic panel     Status: Abnormal   Collection Time: 08/13/19 12:32 PM  Result Value Ref Range   Sodium 138 135 - 145 mmol/L   Potassium  4.7 3.5 - 5.1 mmol/L   Chloride 98 98 - 111 mmol/L   CO2 31 22 - 32 mmol/L   Glucose, Bld 251 (H) 70 - 99 mg/dL   BUN 42 (H) 8 - 23 mg/dL   Creatinine, Ser 1.78 (H) 0.61 - 1.24 mg/dL   Calcium 9.0 8.9 - 10.3 mg/dL   Total Protein 7.2 6.5 - 8.1 g/dL   Albumin 3.7 3.5 - 5.0 g/dL   AST 18 15 - 41 U/L   ALT 16 0 - 44 U/L   Alkaline Phosphatase 81 38 - 126 U/L   Total Bilirubin 0.9 0.3 - 1.2 mg/dL   GFR calc non Af Amer 34 (L) >60 mL/min   GFR calc Af Amer 40 (L) >60 mL/min   Anion gap 9 5 - 15    Comment: Performed at [facility], [address], [place], [place] [address]  Troponin I (High Sensitivity)     Status: Abnormal   Collection Time: 08/13/19 12:32 PM  Result Value Ref Range   Troponin I (High Sensitivity) 49 (H) <18 ng/L    Comment: (NOTE) Elevated high sensitivity troponin I (hsTnI) values and significant  changes across serial measurements may suggest ACS but many other  chronic and acute conditions are known to elevate hsTnI results.  Refer to the "Links" section for chest pain algorithms and additional  guidance. Performed at [facility], [address] [place], [place] [address]   Brain natriuretic peptide     Status: Abnormal   Collection Time: 08/13/19 12:35 PM  Result Value Ref Range   B Natriuretic Peptide 244.0 (H) 0.0 - 100.0 pg/mL    Comment: Performed at [facility], [place], [place] [address]  Troponin I (High Sensitivity)      Status: Abnormal   Collection Time: 08/13/19  3:31 PM  Result Value Ref Range   Troponin I (High Sensitivity) 47 (H) <18 ng/L    Comment: (NOTE) Elevated high sensitivity troponin I (hsTnI) values and significant  changes across serial measurements may suggest ACS but many other  chronic and acute conditions are known to elevate hsTnI results.  Refer to the "Links" section for chest pain algorithms and additional  guidance. Performed at [facility], [address] [place], [place] [address]    @[email or web address] @  Blood pressure (!) 103/54, pulse 71, temperature 98.3 F (36.8 C), temperature source Oral, resp. rate 16, height 6\' 1"  (1.854 m), weight 102.1 kg, SpO2 95 %.    Assessment/Plan Problem  Acute Exacerbation of Chf (Congestive Heart Failure) ([facility]cc)  Ckd (Chronic Kidney Disease), Stage II  S/P Tavr (Transcatheter Aortic Valve Replacement)  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 [employer]  Paroxysmal Atrial Flutter ([facility])  Coronary Artery Disease Involving Native Coronary Artery of Native Heart Without Angina Pectoris  Acute On Chronic Diastolic Chf (Congestive Heart Failure) (H[facility])  Chronic Anticoagulation  Chronic Radicular Pain of Lower Back  Carotid Arterial Disease ([facility])  Mdd (Major Depressive Disorder), Recurrent Episode, Moderate ([facility])  Diabetes Mellitus Type 2, Uncontrolled, With Complications ([facility])  HYPERCHOLESTEROLEMIA  Essential Hypertension  GERD    Acute exacerbation of chronic combined CHF: The patient will be admitted to a telemetry bed. He will be given IV lasix 60 mg bid. This may need to be held due to the patient's marginal blood pressures. Echocardiogram is pending, and cardiology has been consulted. Most recent echocardiogram demonstrates an EF of 50-55% with evidence of diastolic dysfunction and moderate hypkinesis of  the LV, basal-mid septal wall. Palliative care also consulted.  Anasarca: Monitor volume status and diureses as allowed by  blood pressures and creatinine.  Hypotension:Blood pressures are marginally low currently with a systolic blood pressure of 103.   COPD: Continue nebulizer treatments and give steroids as necessary.   DM II: FSBS with SSI to follow glucoses. Continue lantus. The patient will receive a heart healthy, carbohydrate controlled diet.   S/P pacemaker: Noted and stable. Interrogation as per cardiology S/P TAVR: Noted with small leak seen on echocardiogram. Stable.  History of atrial flutter: Ventricular rhythm.  I have seen and examined this patient myself. I have spent 76 minutes in his evaluation and admission.  CODE STATUS: Full Code DVT Prophylaxis: Eliquis Family communication: None available Disposition: tbd   Kirston Luty 08/13/2019, 4:47 PM

## 2019-08-13 NOTE — ED Provider Notes (Signed)
[facility] Emergency Department Provider Note  ____________________________________________   None    (approximate)   I have reviewed the triage vital signs and the nursing notes.   Patient has been triaged with a MSE exam performed by myself at a minimum. Based on symptoms and screening exam, patient may receive a more in-depth exam, labs, imaging as detailed below. Patients have been advised of this setting and exam type at the time of patient interview.    HISTORY  Chief Complaint Leg Swelling (left and right)    HPI Charles [name] is a 85 y.o. male presents to the emergency department with a complaint of bilateral lower extremity edema, increasing shortness of breath. Patient has a HX of CHF. Patient states that symptoms have been slowly increasing. Does not check weights daily. No CP, abd pain, N/V/D. No GI/GU complaints.. O2 at home but has not increased LPM.  Patient will receive a medical screening exam as detailed below.  Based off of this exam, more in depth exam, labs, imaging will be performed as needed for complaint.  Patient care will be eventually transferred to another provider in the emergency department for final exam, diagnosis and disposition.    Past Medical History:  Diagnosis Date  . Abdominal aortic atherosclerosis ([place])    by xray  . Arthritis    in lower back (06/24/2015)  . Atrial flutter ([place])    notes 06/24/2015  . AV block, [place] 1 09/06/2017   Archie Endo 09/06/2017  . 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 CAD (coronary artery disease) 2004   a. s/p CABG in 2004 with LIMA-LAD, SVG-RCA, SVG-OM, and SVG-RI  . Carotid stenosis 1999   s/p L CEA  . Chronic diastolic CHF (congestive heart failure) ([place])   . Colon polyp 2005   (Dr. Tiffany Kocher)  . Compression fracture of L1 lumbar vertebra (HCC) remote  . COPD (chronic obstructive pulmonary disease) ([place]) 03/2011   by xray  . Depression    hx  .  GERD (gastroesophageal reflux disease) 2003   h/o duodenal ulcer per EGD as well as esophagitis  . History of colon polyps 2003, 2005   adenomatous Vira Agar)  . History of tobacco abuse   . HLD (hyperlipidemia)   . HTN (hypertension)   . Presence of permanent cardiac pacemaker   . Psoriasis   . Renal artery stenosis ([place]) 2004   70% bilateral, followed by cards  . 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 Severe aortic stenosis   . T2DM (type 2 diabetes mellitus) ([place]) 1995  . Urge incontinence of urine     Patient Active Problem List   Diagnosis Date Noted  . Chronic respiratory failure ([place]) 07/19/2019  . AKI (acute kidney injury) ([place]) 05/25/2019  . CKD (chronic kidney disease), stage II 12/27/2018  . S/P TAVR (transcatheter aortic valve replacement)   . 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 Paroxysmal atrial flutter ([place])   . Scrotal swelling 02/16/2018  . Coronary artery disease involving native coronary artery of native heart without angina pectoris   . Acute on chronic diastolic CHF (congestive heart failure) ([place]) 09/06/2017  . Urinary urgency 07/01/2017  . Chronic anticoagulation 11/05/2016  . Chronic radicular pain of lower back 02/17/2016  . Aortic stenosis, severe 06/04/2014  . DDD (degenerative disc disease),  lumbar 10/02/2013  . Ex-smoker   . Carotid arterial disease ([place]) 08/23/2013  . MDD (major depressive disorder), recurrent episode, moderate ([place]) 09/21/2007  . OSTEOARTHRITIS 09/21/2007  . COLONIC POLYPS 01/31/2007  . Diabetes mellitus type 2, uncontrolled, with complications ([place]) [id number]  . HYPERCHOLESTEROLEMIA 01/31/2007  . ERECTILE DYSFUNCTION 01/31/2007  . Essential hypertension 01/31/2007  . GERD 01/31/2007  . PSORIASIS 01/31/2007    Past Surgical History:  Procedure Laterality Date  . CARDIAC  CATHETERIZATION  03/06/2003   No intervention - recommend CABG  . CARDIOVASCULAR STRESS TEST  11/2010   normal perfusion, no evidence of ischemia, EF 62% post exercise  . CARDIOVASCULAR STRESS TEST  11/28/2012   Mild diaphragmatic attenuation; cannot exclude a focal region of nontransmural inferior scar  . CARDIOVERSION N/A 06/25/2015   Procedure: CARDIOVERSION;  Surgeon: Pixie Casino, MD;  Location: [facility] ENDOSCOPY;  Service: Cardiovascular;  Laterality: N/A;  . CARDIOVERSION N/A 11/02/2016   Procedure: CARDIOVERSION;  Surgeon: [name], MD;  Location: [place];  Service: Cardiovascular;  Laterality: N/A;  . CARDIOVERSION N/A 03/20/2019   Procedure: CARDIOVERSION;  Surgeon: Jerline Pain, MD;  Location: [place];  Service: Cardiovascular;  Laterality: N/A;  . CAROTID ENDARTERECTOMY Left 1999   ([place])  . CATARACT EXTRACTION W/ INTRAOCULAR LENS  IMPLANT, BILATERAL Bilateral 01/2013   [place]  . COLONOSCOPY  2003   colon polyp x3 - adenomatous Tiffany Kocher)  . COLONOSCOPY  10/08/2012   2 TA, diverticulosis, int hem, no rpt rec Tiffany Kocher)  . CORONARY ANGIOPLASTY    . CORONARY ARTERY BYPASS GRAFT  03/07/2003   4v CABG ([place]) with LIMA to LAD, vein graft to RCA, 1st obtuse marginal, and ramus intermedius  . ESOPHAGOGASTRODUODENOSCOPY  10/08/2012   nl esophagus, duodenitis and erosive gastropathy, path - gastropathy no Hpylori, no rpt rec  . LUMBAR EPIDURAL INJECTION  03/2017   L5/S1 ([place])  . PACEMAKER IMPLANT N/A 04/21/2018   Procedure: PACEMAKER IMPLANT -- Dual Chamber;  Surgeon: Deboraha Sprang, MD;  Location: [place] CV LAB;  Service: Cardiovascular;  Laterality: N/A;  . PERIPHERAL VASCULAR CATHETERIZATION N/A 02/02/2016   Procedure: Renal Angiography;  Surgeon: [name], MD;  Location: [place] CV LAB;  Service: Cardiovascular;  Laterality: N/A;  . PERIPHERAL VASCULAR CATHETERIZATION N/A 02/02/2016   Procedure: Abdominal Aortogram;  Surgeon: [name], MD;  Location:  [place] CV LAB;  Service: Cardiovascular;  Laterality: N/A;  . RENAL DOPPLER  11/29/2011   Celiac&SMA-demonstrated vessel narrowing suggestive of a greater than 50% diameter reduction. Bilateral renal arteries-demonstrated vessel narrowing of 60-99% diameter reduction. Rt Kidney-mid pole lateral simple cyst noted measuring 1.29x0.76x1.11cm and exophytic cyst outside lower pole measuring 1.23x0.96x1.31. Lft Kidney-lateral mid to lower pole simple cyst measuering-1.24x9.83x1.24  . RIGHT/LEFT HEART CATH AND CORONARY/GRAFT ANGIOGRAPHY N/A 10/20/2017   Procedure: RIGHT/LEFT HEART CATH AND CORONARY/GRAFT ANGIOGRAPHY;  Surgeon: Burnell Blanks, MD;  Location: [place] CV LAB;  Service: Cardiovascular;  Laterality: N/A;  . TEE WITHOUT CARDIOVERSION N/A 06/25/2015   Procedure: TRANSESOPHAGEAL ECHOCARDIOGRAM (TEE);  Surgeon: Pixie Casino, MD;  Location: [facility] ENDOSCOPY;  Service: Cardiovascular;  Laterality: N/A;  . TEE WITHOUT CARDIOVERSION N/A 11/02/2016   Procedure: TRANSESOPHAGEAL ECHOCARDIOGRAM (TEE);  Surgeon: [name], MD;  Location: [place];  Service: Cardiovascular;  Laterality: N/A;  . TEE WITHOUT CARDIOVERSION N/A 12/05/2018   Procedure: TRANSESOPHAGEAL ECHOCARDIOGRAM (TEE);  Surgeon: Sherren Mocha, MD;  Location: [place];  Service: Open Heart Surgery;  Laterality: N/A;  . TONSILLECTOMY    .  TRANSCATHETER AORTIC VALVE REPLACEMENT, TRANSFEMORAL N/A 12/05/2018   Procedure: TRANSCATHETER AORTIC VALVE REPLACEMENT, TRANSFEMORAL;  Surgeon: Sherren Mocha, MD;  Location: [place];  Service: Open Heart Surgery;  Laterality: N/A;  . UPPER GASTROINTESTINAL ENDOSCOPY  2003   reflux esophagitis, erosive gastropathy, duodenal ulcer    Prior to Admission medications   Medication Sig Start Date End Date Taking? Authorizing Provider  acetaminophen (TYLENOL) 500 MG tablet Take 1 tablet (500 mg total) by mouth 3 (three) times daily. Patient taking differently: Take 1,000 mg by mouth every 8  (eight) hours as needed for mild pain or headache.  02/17/16   [name], MD  amiodarone (PACERONE) 200 MG tablet Take 400mg , 2 tabs, 2 times daily for 2wks; Then reduce 400mg , 2 tabs, 1 time daily for 2wks; then begin 200mg , 1 tablet, once daily. Patient taking differently: Take 200-400 mg by mouth See admin instructions. Take 400 mg by mouth twice daily for 2 weeks, then 400 mg daily for 2 weeks then 200 mg daily 06/22/19   Deboraha Sprang, MD  apixaban (ELIQUIS) 2.5 MG TABS tablet Take 1 tablet (2.5 mg total) by mouth 2 (two) times daily. 06/29/19   [name], MD  Ascorbic Acid (VITAMIN C PO) Take 1 tablet by mouth daily.    [provider]  atorvastatin (LIPITOR) 20 MG tablet Take 1 tablet (20 mg total) by mouth daily. 08/01/18   [name], MD  BD INSULIN SYRINGE U/F 30G X 1/2" 0.5 ML MISC AS DIRECTED. 03/06/19   [name], MD  citalopram (CELEXA) 20 MG tablet Take 1 tablet (20 mg total) by mouth daily. 07/16/19   [name], MD  finasteride (PROSCAR) 5 MG tablet Take 1 tablet (5 mg total) by mouth daily. 04/03/19   [name], MD  glucose blood (ACCU-CHEK AVIVA PLUS) test strip Use as instructed to check blood sugar three times a day. Dx code E11.8 & E11.65 04/10/19   [name], MD  guaiFENesin (MUCINEX) 600 MG 12 hr tablet Take 600 mg by mouth 2 (two) times daily as needed for cough or to loosen phlegm.    [provider]  insulin glargine (LANTUS) 100 UNIT/ML injection Inject 0.25 mLs (25 Units total) into the skin 2 (two) times daily. Patient taking differently: Inject 20 Units into the skin 2 (two) times daily.  05/30/19   [name], MD  lisinopril (PRINIVIL,ZESTRIL) 40 MG tablet Take 1 tablet (40 mg total) by mouth daily. 12/14/18 12/09/19  Eileen Stanford, PA-C  metFORMIN (GLUCOPHAGE) 500 MG tablet Take 1 tablet (500 mg total) by mouth 2 (two) times daily with a meal. 05/30/19   [name], MD  nitroGLYCERIN  (NITROSTAT) 0.4 MG SL tablet Place 1 tablet (0.4 mg total) under the tongue every 5 (five) minutes as needed for chest pain. 03/08/18   Almyra Deforest, PA  pantoprazole (PROTONIX) 20 MG tablet Take 1 tablet (20 mg total) by mouth daily. 02/28/19   [name], MD  potassium chloride SA (K-DUR) 20 MEQ tablet Take 1 tablet (20 mEq total) every Monday, Wednesday, Friday, Saturday, and Sunday. Patient taking differently: Take 20 mEq by mouth See admin instructions. Take 1 tablet (20 mEq total) every Monday, Wednesday, Friday, Saturday, and Sunday. 04/09/19   [name], MD  tamsulosin (FLOMAX) 0.4 MG CAPS capsule Take 1 capsule (0.4 mg total) by mouth daily after supper. 04/09/19   [name], MD  torsemide (DEMADEX) 20 MG tablet Take 3 tablets (60 mg total) by mouth daily.  07/24/19   Lendon Colonel, NP  traZODone (DESYREL) 50 MG tablet Take 2 tablets (100 mg total) by mouth at bedtime. 07/16/19   [name], MD    Allergies Gabapentin, Metoprolol, Spironolactone, Amlodipine, Other, Rosiglitazone maleate, and Tricor [fenofibrate]  Family History  Problem Relation Age of Onset  . Hypertension Father   . Heart disease Father   . Cancer Mother        GI cancer  . Diabetes Sister   . Heart disease Sister   . Stroke Sister   . Hypertension Sister   . Cancer Brother        Lung cancer - nonsmoker  . Cancer Maternal Grandmother   . Heart attack Maternal Grandfather   . Heart attack Paternal Grandmother   . Heart attack Paternal Grandfather   . Cancer Brother        Pancreatic cancer  . Lung disease Brother     Social History Social History   Tobacco Use  . Smoking status: Former Smoker    Packs/day: 0.50    Years: 36.00    Pack years: 18.00    Types: Cigarettes    Quit date: 07/23/2013    Years since quitting: 6.0  . Smokeless tobacco: Never Used  Substance Use Topics  . Alcohol use: No  . Drug use: No    Review of Systems Constitutional: no fever ENT: no  nasal congestion/rhinorhea. no sore throat Cardiovascular: no chest pain. Increased edema bilat lower extremities Respiratory: no cough. positive shortness of breath/difficulty breathing Gastroenterology: no abdominal pain Musculoskeletal: no for musculoskeletal pain. Integumentary: Negative for rash. Neurological: No focal weakness nor numbness.   ____________________________________________   PHYSICAL EXAM:  VITAL SIGNS: ED Triage Vitals  Enc Vitals Group     BP 08/13/19 1228 (!) 103/54     Pulse Rate 08/13/19 1228 71     Resp 08/13/19 1228 16     Temp 08/13/19 1228 98.3 F (36.8 C)     Temp Source 08/13/19 1228 Oral     SpO2 08/13/19 1228 95 %     Weight 08/13/19 1229 225 lb (102.1 kg)     Height 08/13/19 1229 6\' 1"  (1.854 m)     Head Circumference --      Peak Flow --      Pain Score 08/13/19 1229 0     Pain Loc --      Pain Edu? --      Excl. in [place]? --     Constitutional: Alert and oriented. Generally well appearing and in no acute distress. Eyes: Conjunctivae are normal.  Nose: No significant congestion/rhinnorhea. Mouth: No gross oropharyngeal edema. no erythema/edema Neck: No stridor.  No meningeal signs.   Cardiovascular: Grossly normal heart sounds. bilat lower extremity edema Respiratory: Normal respiratory effort without significant tachypnea and no observed retractions. Lungs CTAB Gastrointestinal: No significant visible abdominal wall findings.  Bowel sounds x4 quadrants. no tenderness to palpation. Musculoskeletal: No gross deformities of extremities. Neurologic:  Normal speech and language. No gross focal neurologic deficits are appreciated.  Skin:  Skin is warm, dry and intact. No rash noted.    ____________________________________________   LABS (all labs ordered are listed, but only abnormal results are displayed)  Labs Reviewed  CBC WITH DIFFERENTIAL/PLATELET  COMPREHENSIVE METABOLIC PANEL  BRAIN NATRIURETIC PEPTIDE  TROPONIN I (HIGH  SENSITIVITY)    ____________________________________________   RADIOLOGY   Official radiology report(s): No results found.  ____________________________________________    INITIAL IMPRESSION / MDM / ASSESSMENT  AND PLAN / ED COURSE  As part of my medical decision making, I reviewed the following data within the electronic MEDICAL RECORD NUMBER Notes from prior ED visits      Clinical Impression: shortness of breath, extremity edema, CHF   Plan: labs, ekg, chest x-ray  Patient has been screened based based on their arrival complaint, evaluated for an emergent condition, and at a minimum has received a medical screening exam.  At this time, patient will receive the further work-up listed above that was determined by medical screening exam.  Patient care will be transferred to another provider in the emergency department once patient is roomed for final diagnosis and disposition.    ____________________________________________  Note:  This document was prepared using Systems analyst and may include unintentional dictation errors.    Darletta Moll, PA-C 08/13/19 1257    Earleen Newport, MD 08/13/19 1308

## 2019-08-13 NOTE — ED Notes (Signed)
Attempted to call report but did not get an answer on the floor

## 2019-08-13 NOTE — ED Notes (Signed)
Pt missed urinal and needed to be changed before taken to floor, per floor pt will now have to wait until after 7:30 before he can come up

## 2019-08-13 NOTE — ED Triage Notes (Signed)
Patient c/o left and right leg swelling. Arrived by EMS from home. BAseline ambulates with cane. Wears 2L O2 continuously. Denies pain

## 2019-08-13 NOTE — ED Notes (Signed)
Pt required 2 person assist to bathroom for BM

## 2019-08-14 ENCOUNTER — Inpatient Hospital Stay (HOSPITAL_COMMUNITY)
Admit: 2019-08-14 | Discharge: 2019-08-14 | Disposition: A | Discharge status: Home / Self Care | Payer: Medicare Other | Attending: Internal Medicine | Admitting: Internal Medicine

## 2019-08-14 DIAGNOSIS — I5031 Acute diastolic (congestive) heart failure: Secondary | ICD-10-CM

## 2019-08-14 DIAGNOSIS — I5033 Acute on chronic diastolic (congestive) heart failure: Secondary | ICD-10-CM

## 2019-08-14 DIAGNOSIS — Z515 Encounter for palliative care: Secondary | ICD-10-CM

## 2019-08-14 DIAGNOSIS — I4892 Unspecified atrial flutter: Secondary | ICD-10-CM

## 2019-08-14 DIAGNOSIS — Z7189 Other specified counseling: Secondary | ICD-10-CM

## 2019-08-14 DIAGNOSIS — Z952 Presence of prosthetic heart valve: Secondary | ICD-10-CM

## 2019-08-14 LAB — GLUCOSE, CAPILLARY: Glucose-Capillary: 355 mg/dL — ABNORMAL HIGH (ref 70–99)

## 2019-08-14 LAB — BASIC METABOLIC PANEL
Anion gap: 8 (ref 5–15)
BUN: 39 mg/dL — ABNORMAL HIGH (ref 8–23)
CO2: 33 mmol/L — ABNORMAL HIGH (ref 22–32)
Calcium: 9 mg/dL (ref 8.9–10.3)
Chloride: 101 mmol/L (ref 98–111)
Creatinine, Ser: 1.76 mg/dL — ABNORMAL HIGH (ref 0.61–1.24)
GFR calc Af Amer: 40 mL/min — ABNORMAL LOW (ref >60)
GFR calc non Af Amer: 35 mL/min — ABNORMAL LOW (ref >60)
Glucose, Bld: 81 mg/dL (ref 70–99)
Potassium: 4.2 mmol/L (ref 3.5–5.1)
Sodium: 142 mmol/L (ref 135–145)

## 2019-08-14 LAB — CBC
HCT: 29.3 % — ABNORMAL LOW (ref 39.0–52.0)
Hemoglobin: 9.4 g/dL — ABNORMAL LOW (ref 13.0–17.0)
MCH: 29.6 pg (ref 26.0–34.0)
MCHC: 32.1 g/dL (ref 30.0–36.0)
MCV: 92.1 fL (ref 80.0–100.0)
Platelets: 157 10*3/uL (ref 150–400)
RBC: 3.18 MIL/uL — ABNORMAL LOW (ref 4.22–5.81)
RDW: 13.1 % (ref 11.5–15.5)
WBC: 6.1 10*3/uL (ref 4.0–10.5)
nRBC: 0 % (ref 0.0–0.2)

## 2019-08-14 LAB — ECHOCARDIOGRAM COMPLETE
Height: 73 in
Weight: 3726.4 oz

## 2019-08-14 LAB — SARS CORONAVIRUS 2 (TAT 6-24 HRS): SARS Coronavirus 2: NEGATIVE

## 2019-08-14 MED ORDER — FUROSEMIDE 10 MG/ML IJ SOLN
80.0000 mg | Freq: Two times a day (BID) | INTRAMUSCULAR | Status: DC
  Administered 2019-08-14 – 2019-08-18 (×8): 80 mg via INTRAVENOUS
  Filled 2019-08-14 (×8): qty 8

## 2019-08-14 MED ORDER — POTASSIUM CHLORIDE CRYS ER 10 MEQ PO TBCR
10.0000 meq | EXTENDED_RELEASE_TABLET | Freq: Once | ORAL | Status: AC
  Administered 2019-08-15: 10 meq via ORAL
  Filled 2019-08-14: qty 1

## 2019-08-14 MED ORDER — INSULIN ASPART 100 UNIT/ML ~~LOC~~ SOLN
0.0000 [IU] | Freq: Every day | SUBCUTANEOUS | Status: DC
  Administered 2019-08-14: 5 [IU] via SUBCUTANEOUS
  Administered 2019-08-15 – 2019-08-16 (×2): 2 [IU] via SUBCUTANEOUS
  Administered 2019-08-17 – 2019-08-18 (×2): 3 [IU] via SUBCUTANEOUS
  Administered 2019-08-19: 2 [IU] via SUBCUTANEOUS
  Filled 2019-08-14 (×6): qty 1

## 2019-08-14 MED ORDER — INSULIN ASPART 100 UNIT/ML ~~LOC~~ SOLN
0.0000 [IU] | Freq: Three times a day (TID) | SUBCUTANEOUS | Status: DC
  Administered 2019-08-15: 3 [IU] via SUBCUTANEOUS
  Administered 2019-08-15: 2 [IU] via SUBCUTANEOUS
  Administered 2019-08-16 (×2): 5 [IU] via SUBCUTANEOUS
  Administered 2019-08-17: 2 [IU] via SUBCUTANEOUS
  Administered 2019-08-17: 5 [IU] via SUBCUTANEOUS
  Administered 2019-08-18 – 2019-08-19 (×3): 3 [IU] via SUBCUTANEOUS
  Administered 2019-08-19: 2 [IU] via SUBCUTANEOUS
  Filled 2019-08-14 (×10): qty 1

## 2019-08-14 MED ORDER — ALBUTEROL SULFATE (2.5 MG/3ML) 0.083% IN NEBU: 2.5000 mg | INHALATION_SOLUTION | Freq: Four times a day (QID) | RESPIRATORY_TRACT | Status: DC | PRN

## 2019-08-14 MED ORDER — ALBUTEROL SULFATE (2.5 MG/3ML) 0.083% IN NEBU
2.5000 mg | INHALATION_SOLUTION | RESPIRATORY_TRACT | Status: DC | PRN
  Administered 2019-08-15: 2.5 mg via RESPIRATORY_TRACT
  Filled 2019-08-14: qty 3

## 2019-08-14 MED ORDER — POTASSIUM CHLORIDE CRYS ER 10 MEQ PO TBCR: 10.0000 meq | EXTENDED_RELEASE_TABLET | Freq: Once | ORAL | Status: DC

## 2019-08-14 MED ORDER — ALBUTEROL SULFATE (2.5 MG/3ML) 0.083% IN NEBU
INHALATION_SOLUTION | RESPIRATORY_TRACT | Status: AC
  Administered 2019-08-14: 18:00:00
  Filled 2019-08-14: qty 3

## 2019-08-14 MED ORDER — SODIUM CHLORIDE 0.9% FLUSH
3.0000 mL | Freq: Two times a day (BID) | INTRAVENOUS | Status: DC
  Administered 2019-08-15 – 2019-08-20 (×9): 3 mL via INTRAVENOUS

## 2019-08-14 MED ORDER — LISINOPRIL 20 MG PO TABS
40.0000 mg | ORAL_TABLET | Freq: Every day | ORAL | Status: DC
  Administered 2019-08-15: 40 mg via ORAL
  Filled 2019-08-14 (×2): qty 2

## 2019-08-14 MED ORDER — ORAL CARE MOUTH RINSE
15.0000 mL | Freq: Two times a day (BID) | OROMUCOSAL | Status: DC
  Administered 2019-08-15 – 2019-08-20 (×11): 15 mL via OROMUCOSAL

## 2019-08-14 NOTE — TOC Initial Note (Signed)
Transition of Care [facility]) - Initial/Assessment Note    Patient Details  Name: Charles [name] MRN: [id number] Date of Birth: 02-09-1932  Transition of Care [facility]) CM/SW Contact:    Charles Chroman, LCSW Phone Number: 08/14/2019, 2:05 PM  Clinical Narrative: Readmission prevention screen complete. CSW met with patient. Son at bedside. CSW introduced role and explained that discharge planning would be discussed. Patient was set up with [facility] for HHPT last week. He uses oxygen at home. Patient lives home alone but his son and daughter-in-law check on him daily. They drive him to appts. PCP is Charles [name] and pharmacy is Charles [name] in [place]. No issues affording medications. Patient has a scale at home but has been too weak to use it. Son asking about SNF placement. MD placed orders for PT and OT so he can be evaluated. No further concerns. CSW encouraged patient and his son to contact CSW as needed. CSW will continue to follow patient and his son for support and facilitate discharge home vs. SNF once medically stable.                 Expected Discharge Plan: Skilled Nursing Facility Barriers to Discharge: Continued Medical Work up   Patient Goals and CMS Choice        Expected Discharge Plan and Services Expected Discharge Plan: [place]       Living arrangements for the past 2 months: Single Family Home                                      Prior Living Arrangements/Services Living arrangements for the past 2 months: Single Family Home Lives with:: Self Patient language and need for interpreter reviewed:: Yes Do you feel safe going back to the place where you live?: Yes      Need for Family Participation in Patient Care: Yes (Comment) Care giver support system in place?: Yes (comment) Current home services: Home PT Criminal Activity/Legal Involvement Pertinent to Current Situation/Hospitalization: No - Comment as needed  Activities of Daily  Living Home Assistive Devices/Equipment: Walker (specify type), Oxygen ADL Screening (condition at time of admission) Patient's cognitive ability adequate to safely complete daily activities?: Yes Is the patient deaf or have difficulty hearing?: Yes Does the patient have difficulty seeing, even when wearing glasses/contacts?: No Does the patient have difficulty concentrating, remembering, or making decisions?: No Patient able to express need for assistance with ADLs?: Yes Does the patient have difficulty dressing or bathing?: Yes Independently performs ADLs?: No Communication: Independent Dressing (OT): Needs assistance Is this a change from baseline?: Pre-admission baseline Grooming: Needs assistance Is this a change from baseline?: Pre-admission baseline Feeding: Independent Bathing: Needs assistance Is this a change from baseline?: Pre-admission baseline Toileting: Needs assistance Is this a change from baseline?: Pre-admission baseline In/Out Bed: Needs assistance Is this a change from baseline?: Pre-admission baseline Walks in Home: Needs assistance Is this a change from baseline?: Pre-admission baseline Does the patient have difficulty walking or climbing stairs?: Yes Weakness of Legs: Both Weakness of Arms/Hands: None  Permission Sought/Granted Permission sought to share information with : Family Supports    Share Information with NAME: Charles [name]     Permission granted to share info w Relationship: Son  Permission granted to share info w Contact Information: [phone]  Emotional Assessment Appearance:: Appears stated age Attitude/Demeanor/Rapport: Engaged, Gracious Affect (typically observed): Accepting, Appropriate, Calm, Pleasant Orientation: :  Oriented to Self, Oriented to Place, Oriented to  Time, Oriented to Situation Alcohol / Substance Use: Never Used Psych Involvement: No (comment)  Admission diagnosis:  Anasarca [R60.1] Peripheral edema  [R60.9] Patient Active Problem List   Diagnosis Date Noted  . Acute on chronic heart failure with preserved ejection fraction (HFpEF) ([place]) 08/13/2019  . Chronic respiratory failure ([place]) 07/19/2019  . AKI (acute kidney injury) ([place]) 05/25/2019  . CKD (chronic kidney disease), stage II 12/27/2018  . S/P TAVR (transcatheter aortic valve replacement)   . 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 Paroxysmal atrial flutter ([place])   . Scrotal swelling 02/16/2018  . Coronary artery disease involving native coronary artery of native heart without angina pectoris   . Acute on chronic diastolic CHF (congestive heart failure) ([place]) 09/06/2017  . Urinary urgency 07/01/2017  . Chronic anticoagulation 11/05/2016  . Chronic radicular pain of lower back 02/17/2016  . Aortic stenosis, severe 06/04/2014  . DDD (degenerative disc disease), lumbar 10/02/2013  . Ex-smoker   . Carotid arterial disease ([place]) 08/23/2013  . MDD (major depressive disorder), recurrent episode, moderate ([place]) 09/21/2007  . OSTEOARTHRITIS 09/21/2007  . COLONIC POLYPS 01/31/2007  . Diabetes mellitus type 2, uncontrolled, with complications ([place]) 23/55/7322  . HYPERCHOLESTEROLEMIA 01/31/2007  . ERECTILE DYSFUNCTION 01/31/2007  . Essential hypertension 01/31/2007  . GERD 01/31/2007  . PSORIASIS 01/31/2007   PCP:  [name], MD Pharmacy:   [place], [place] - [address] [place] [address] Phone: [phone] Fax: [phone]     Social Determinants of Health (SDOH) Interventions    Readmission Risk Interventions Readmission Risk Prevention Plan 08/14/2019  Transportation Screening Complete  HRI or [place] Complete  Social Work Consult for [place] Planning/Counseling Complete  Palliative Care Screening Complete  Medication Review Press photographer) Complete  Some recent data might be hidden

## 2019-08-14 NOTE — Consult Note (Addendum)
Consultation Note Date: 08/14/2019   Patient Name: Charles [name]  DOB: Oct 30, 1931  MRN: [id number]  Age / Sex: 85 y.o., male  PCP: [name], MD Referring Physician: Karie Kirks, DO  Reason for Consultation: Establishing goals of care  HPI/Patient Profile: The patient is a 85 yr old man who has a past medial history significant for CHF, AAA, Atrial flutter, CAD,Carotid stenosis, Chronic diastolic CHF, COPD, L1 Compression fracture, GERD, Depression, Hyperlipidemia, hypertension, Pacemaker, S/P TAVR for severe aortic stenosis, DM II, AB Block Mobitz 1. The patient presented to the ED after having been in the ED last Friday for the same complaints, although he states that they weren't as severe at that time. He was given a one time injection of lasix in the ED and he was sent home on torsemide. He states that his swelling has "doubled" since that time, despite taking an increased dose of the torsemide as directed by cardiology.  Clinical Assessment and Goals of Care: Patient is resting in bed. He lives alone, and family comes in to visit frequently. He is widowed.  He spends a lot of time watching t.v.   Functionally, he is able to fix quick food. He uses a walker, but has had pain with walking due to a bruise on his left hip from a fall. He states he has been on O2 for around 1 month. He performs his ADL's.    We discussed his diagnosies, prognosis, GOC, EOL wishes disposition and options.  A detailed discussion was had today regarding advanced directives.  Concepts specific to code status, artifical feeding and hydration, IV antibiotics and rehospitalization were discussed.  The difference between an aggressive medical intervention path and a comfort care path was discussed.  Values and goals of care important to patient and family were attempted to be elicited.  Discussed limitations of medical  interventions to prolong quality of life in some situations and discussed the concept of human mortality.  He states he wants all care possible "until I change my mind." Inquired further about this, and he said he wasn't sure what could happen to change his mind. He states he wants to be around for his family, and quantity at this time is most important. He states he does not want to go to a SNF or long term care facility for any length of time.      SUMMARY OF RECOMMENDATIONS   Full code/full scope at this time.  Palliative will continue to follow.  Recommend palliative at D/C.    Symptom Management:   Continue diuresis as able.   Prognosis:   Poor overall.      Primary Diagnoses: Present on Admission: . Paroxysmal atrial flutter ([place]) . 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 [employer] . CKD (chronic kidney disease), stage II . Diabetes mellitus type 2, uncontrolled, with complications ([place]) . HYPERCHOLESTEROLEMIA . MDD (major depressive disorder), recurrent episode, moderate ([place]) . Essential hypertension . GERD . Carotid arterial disease ([place]) . Chronic radicular pain of lower back . Coronary  artery disease involving native coronary artery of native heart without angina pectoris . Acute on chronic diastolic CHF (congestive heart failure) ([place])   I have reviewed the medical record, interviewed the patient and family, and examined the patient. The following aspects are pertinent.  Past Medical History:  Diagnosis Date  . Abdominal aortic atherosclerosis ([place])    by xray  . Arthritis    in lower back (06/24/2015)  . Atrial flutter ([place])    notes 06/24/2015  . AV block, Mobitz 1 09/06/2017   Archie Endo 09/06/2017  . 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 CAD (coronary artery disease) 2004   a. s/p CABG in 2004 with LIMA-LAD, SVG-RCA, SVG-OM, and SVG-RI  . Carotid stenosis 1999   s/p L CEA  . Chronic diastolic CHF (congestive heart  failure) ([place])   . Colon polyp 2005   (Dr. Tiffany Kocher)  . Compression fracture of L1 lumbar vertebra ([facility]) remote  . COPD (chronic obstructive pulmonary disease) ([place]) 03/2011   by xray  . Depression    hx  . GERD (gastroesophageal reflux disease) 2003   h/o duodenal ulcer per EGD as well as esophagitis  . History of colon polyps 2003, 2005   adenomatous Vira Agar)  . History of tobacco abuse   . HLD (hyperlipidemia)   . HTN (hypertension)   . Presence of permanent cardiac pacemaker   . Psoriasis   . Renal artery stenosis ([place]) 2004   70% bilateral, followed by cards  . 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 Severe aortic stenosis   . T2DM (type 2 diabetes mellitus) ([place]) 1995  . Urge incontinence of urine    Social History   Socioeconomic History  . Marital status: Widowed    Spouse name: Not on file  . Number of children: Not on file  . Years of education: Not on file  . Highest education level: Not on file  Occupational History  . Not on file  Social Needs  . Financial resource strain: Not on file  . Food insecurity    Worry: Not on file    Inability: Not on file  . Transportation needs    Medical: Not on file    Non-medical: Not on file  Tobacco Use  . Smoking status: Former Smoker    Packs/day: 0.50    Years: 36.00    Pack years: 18.00    Types: Cigarettes    Quit date: 07/23/2013    Years since quitting: 6.0  . Smokeless tobacco: Never Used  Substance and Sexual Activity  . Alcohol use: No  . Drug use: No  . Sexual activity: Not Currently  Lifestyle  . Physical activity    Days per week: Not on file    Minutes per session: Not on file  . Stress: Not on file  Relationships  . Social Herbalist on phone: Not on file    Gets together: Not on file    Attends religious service: Not on file    Active member of club or organization:  Not on file    Attends meetings of clubs or organizations: Not on file    Relationship status: Not on file  Other Topics Concern  . Not on file  Social History Narrative   Caffeine: 4 cups coffee/day, some tea and soda   Lives with  wife   Occupation: Retired   Activity: no regular exercise   Diet: good water, fruits/vegetables daily   Family History  Problem Relation Age of Onset  . Hypertension Father   . Heart disease Father   . Cancer Mother        GI cancer  . Diabetes Sister   . Heart disease Sister   . Stroke Sister   . Hypertension Sister   . Cancer Brother        Lung cancer - nonsmoker  . Cancer Maternal Grandmother   . Heart attack Maternal Grandfather   . Heart attack Paternal Grandmother   . Heart attack Paternal Grandfather   . Cancer Brother        Pancreatic cancer  . Lung disease Brother    Scheduled Meds: . amiodarone  200 mg Oral Daily  . apixaban  2.5 mg Oral BID  . atorvastatin  20 mg Oral Daily  . citalopram  20 mg Oral Daily  . finasteride  5 mg Oral Daily  . furosemide  80 mg Intravenous Q12H  . insulin glargine  20 Units Subcutaneous BID  . [START ON 08/15/2019] lisinopril  40 mg Oral Daily  . mouth rinse  15 mL Mouth Rinse BID  . pantoprazole  40 mg Oral Daily  . [START ON 08/15/2019] potassium chloride  10 mEq Oral Once  . sodium chloride flush  3 mL Intravenous Q12H  . tamsulosin  0.4 mg Oral QPC supper  . traZODone  50 mg Oral QHS  . vitamin C  500 mg Oral Daily   Continuous Infusions: . sodium chloride     PRN Meds:.sodium chloride, acetaminophen, acetaminophen, ondansetron (ZOFRAN) IV, sodium chloride flush Medications Prior to Admission:  Prior to Admission medications   Medication Sig Start Date End Date Taking? Authorizing Provider  amiodarone (PACERONE) 200 MG tablet Take 400mg , 2 tabs, 2 times daily for 2wks; Then reduce 400mg , 2 tabs, 1 time daily for 2wks; then begin 200mg , 1 tablet, once daily. Patient taking differently:  Take 200-400 mg by mouth See admin instructions. Take 400 mg by mouth twice daily for 2 weeks, then 400 mg daily for 2 weeks then 200 mg daily 06/22/19  Yes Deboraha Sprang, MD  apixaban (ELIQUIS) 2.5 MG TABS tablet Take 1 tablet (2.5 mg total) by mouth 2 (two) times daily. 06/29/19  Yes [name], MD  Ascorbic Acid (VITAMIN C PO) Take 1 tablet by mouth daily.   Yes [provider]  atorvastatin (LIPITOR) 20 MG tablet Take 1 tablet (20 mg total) by mouth daily. 08/01/18  Yes [name], MD  citalopram (CELEXA) 20 MG tablet Take 1 tablet (20 mg total) by mouth daily. 07/16/19  Yes [name], MD  finasteride (PROSCAR) 5 MG tablet Take 1 tablet (5 mg total) by mouth daily. 04/03/19  Yes [name], MD  guaiFENesin (MUCINEX) 600 MG 12 hr tablet Take 600 mg by mouth 2 (two) times daily as needed for cough or to loosen phlegm.   Yes [provider]  insulin glargine (LANTUS) 100 UNIT/ML injection Inject 0.25 mLs (25 Units total) into the skin 2 (two) times daily. 05/30/19  Yes [name], MD  lisinopril (PRINIVIL,ZESTRIL) 40 MG tablet Take 1 tablet (40 mg total) by mouth daily. 12/14/18 12/09/19 Yes Eileen Stanford, PA-C  metFORMIN (GLUCOPHAGE) 500 MG tablet Take 1 tablet (500 mg total) by mouth 2 (two) times daily with a meal. 05/30/19  Yes [name], MD  pantoprazole ([place])  20 MG tablet Take 1 tablet (20 mg total) by mouth daily. 02/28/19  Yes [name], MD  potassium chloride SA (K-DUR) 20 MEQ tablet Take 1 tablet (20 mEq total) every Monday, Wednesday, Friday, Saturday, and Sunday. Patient taking differently: Take 20 mEq by mouth See admin instructions. Take 1 tablet (20 mEq total) every Monday, Wednesday, Friday, Saturday, and Sunday. 04/09/19  Yes [name], MD  tamsulosin (FLOMAX) 0.4 MG CAPS capsule Take 1 capsule (0.4 mg total) by mouth daily after supper. 04/09/19  Yes [name], MD  torsemide (DEMADEX) 20 MG tablet  Take 3 tablets (60 mg total) by mouth daily. 07/24/19  Yes Lendon Colonel, NP  traZODone (DESYREL) 50 MG tablet Take 2 tablets (100 mg total) by mouth at bedtime. 07/16/19  Yes [name], MD  acetaminophen (TYLENOL) 500 MG tablet Take 1 tablet (500 mg total) by mouth 3 (three) times daily. Patient taking differently: Take 1,000 mg by mouth every 8 (eight) hours as needed for mild pain or headache.  02/17/16   [name], MD  BD INSULIN SYRINGE U/F 30G X 1/2" 0.5 ML MISC AS DIRECTED. 03/06/19   [name], MD  glucose blood (ACCU-CHEK AVIVA PLUS) test strip Use as instructed to check blood sugar three times a day. Dx code E11.8 & E11.65 04/10/19   [name], MD  nitroGLYCERIN (NITROSTAT) 0.4 MG SL tablet Place 1 tablet (0.4 mg total) under the tongue every 5 (five) minutes as needed for chest pain. 03/08/18   Almyra Deforest, PA   Allergies  Allergen Reactions  . Gabapentin Other (See Comments)    Leg pain, weakness   . Metoprolol Swelling  . Spironolactone Other (See Comments)    Painful gynecomastia  . Amlodipine Other (See Comments)    Edema  . Other Other (See Comments)    Horse serum - myalgias  . Rosiglitazone Maleate Other (See Comments)     Did not help  . Tricor [Fenofibrate] Other (See Comments)    myalgias   Review of Systems  Musculoskeletal:       Complains of pain at left hip bruise.    Physical Exam Pulmonary:     Effort: Pulmonary effort is normal.     Comments: [place] in place.  Neurological:     Mental Status: He is alert.     Vital Signs: BP (!) 110/58 (BP Location: Left Arm)   Pulse 73   Temp 98.2 F (36.8 C) (Oral)   Resp 18   Ht 6\' 1"  (1.854 m)   Wt 105.6 kg   SpO2 96%   BMI 30.73 kg/m  Pain Scale: 0-10   Pain Score: 0-No pain   SpO2: SpO2: 96 % O2 Device:SpO2: 96 % O2 Flow Rate: .O2 Flow Rate (L/min): 2 L/min  IO: Intake/output summary:   Intake/Output Summary (Last 24 hours) at 08/14/2019 1545 Last data filed at  08/14/2019 1029 Gross per 24 hour  Intake 840 ml  Output 2300 ml  Net -1460 ml    LBM: Last BM Date: 08/13/19 Baseline Weight: Weight: 102.1 kg Most recent weight: Weight: 105.6 kg     Palliative Assessment/Data:     Time In: 3:15 Time Out: 3:45 Time Total: 30 min Greater than 50%  of this time was spent counseling and coordinating care related to the above assessment and plan.  Signed by: Asencion Gowda, NP   Please contact Palliative Medicine Team phone at [phone] for questions and concerns.  For individual provider: See Shea Evans

## 2019-08-14 NOTE — Consult Note (Addendum)
Cardiology Consultation:   Patient ID: [name] MRN: [id number]; DOB: 10-15-1931  Admit date: 08/13/2019 Date of Consult: 08/14/2019  Primary Care Provider: [name], MD Primary Cardiologist: [name], MD  Primary Electrophysiologist:  None    Patient Profile:   Charles [name] is a 85 y.o. 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 Benny Lennert.  History of Present Illness:   Charles [name] is an 85 year old male with PMH as above.  He was last seen in the office 07/02/2019.  At that time, it was reported that he continued to complaint of volume overload, despite increasing doses of diuretic / torsemide.  Labs are significant for rising creatinine.  He was referred to the advanced heart failure clinic but had not yet had an appointment.  He had seen Dr. Caryl Comes on 06/07/2019 and discussion took place re: repeat DCCV for 9/24.  He was started on amiodarone in preparation and NSR at time of the cardioversion. Due to continued complaint of SOB and fluid retention, his Demadex was increased. It was noted that he was diet non-compliant.  Weight was 226lbs at that time with dry weight estimated 218 lbs.  Since that time, the patient reports that he has been compliant with his medications; however, on further discussion, he reported that he only takes what his daughter gives to him and is not aware of what current medications he is taking.  He denies adding any salt to his food, stating he mainly just  eats waffles.  He reports that he has not recently had any chest pain, racing heart rate, or palpitations.  He denies any shortness of breath, orthopnea, or abdominal distention.  He reports he mainly suffers from increasing lower extremity edema.  He reports a recent fall but is unable to specify the date at this time. He reports left hip pain and ecchymosis (on Eliquis); however, his hip is not bothering him right now.  11/6 CT pelvis and hip xray was without acute finding.   In the [facility] ED, chest x-ray was significant for chronic low volumes with asymmetric elevation of the left hemidiaphragm and left base atelectasis versus scar. Labs were significant for sodium 138, potassium 4.7, glucose 251, creatinine 1.78, BUN 42, AST 18, ALT 16, BNP 244.0, troponin 49 (and known to be chronically elevated), hemoglobin 9.9, WBC 5.8.  Heart Pathway Score:     Past Medical History:  Diagnosis Date   Abdominal aortic atherosclerosis ([place])    by xray   Arthritis    in lower back (06/24/2015)   Atrial flutter ([place])    notes 06/24/2015   AV block, [place] 1 09/06/2017   Archie Endo 09/06/2017   BRVO (branch retinal vein occlusion) 2015   bilateral [facility])   CAD (coronary artery disease) 2004   a. s/p CABG in 2004 with LIMA-LAD, SVG-RCA, SVG-OM, and SVG-RI   Carotid stenosis 1999   s/p L CEA   Chronic diastolic CHF (congestive heart failure) ([place])    Colon polyp 2005   (Dr. 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   Edwards Sapien 3 THV (size 26 mm, model # 9600TFX, serial # [id number]) via the TF approach   Severe aortic stenosis    T2DM (type 2 diabetes mellitus) ([place]) 1995   Urge incontinence of urine     Past Surgical History:  Procedure Laterality Date   CARDIAC CATHETERIZATION  03/06/2003   No intervention - recommend CABG   CARDIOVASCULAR STRESS TEST  11/2010   normal perfusion, no evidence of ischemia, EF 62% post exercise   CARDIOVASCULAR STRESS TEST  11/28/2012   Mild diaphragmatic attenuation; cannot exclude a focal region of nontransmural inferior scar   CARDIOVERSION N/A 06/25/2015   Procedure: CARDIOVERSION;  Surgeon: Pixie Casino, MD;  Location: [place];  Service: Cardiovascular;  Laterality: N/A;   CARDIOVERSION N/A 11/02/2016   Procedure: CARDIOVERSION;  Surgeon: [name], MD;  Location: [place];  Service: Cardiovascular;  Laterality: N/A;   CARDIOVERSION N/A 03/20/2019   Procedure: CARDIOVERSION;  Surgeon: Jerline Pain, MD;  Location: [place] [facility];  Service: Cardiovascular;  Laterality: N/A;   CAROTID ENDARTERECTOMY Left 1999   ([place])   CATARACT EXTRACTION W/ INTRAOCULAR LENS  IMPLANT, BILATERAL Bilateral 01/2013   [place]   COLONOSCOPY  2003   colon polyp x3 - adenomatous Tiffany Kocher)   COLONOSCOPY  10/08/2012   2 TA, diverticulosis, int hem, no rpt rec Tiffany Kocher)   CORONARY ANGIOPLASTY     CORONARY ARTERY BYPASS GRAFT  03/07/2003   4v CABG ([place]) with LIMA to LAD, vein graft to RCA, 1st obtuse marginal, and ramus intermedius   ESOPHAGOGASTRODUODENOSCOPY  10/08/2012   nl esophagus, duodenitis and erosive gastropathy, path - gastropathy no Hpylori, no rpt rec   LUMBAR EPIDURAL INJECTION  03/2017   L5/S1 ([place])   PACEMAKER IMPLANT N/A 04/21/2018   Procedure: PACEMAKER IMPLANT -- Dual Chamber;  Surgeon: Deboraha Sprang, MD;  Location: [place] [facility];  Service: Cardiovascular;   Laterality: N/A;   PERIPHERAL VASCULAR CATHETERIZATION N/A 02/02/2016   Procedure: Renal Angiography;  Surgeon: [name], MD;  Location: [place] [facility];  Service: Cardiovascular;  Laterality: N/A;   PERIPHERAL VASCULAR CATHETERIZATION N/A 02/02/2016   Procedure: Abdominal Aortogram;  Surgeon: [name], MD;  Location: [place] [facility];  Service: Cardiovascular;  Laterality: N/A;   RENAL DOPPLER  11/29/2011   Celiac&SMA-demonstrated vessel narrowing suggestive of a greater than 50% diameter reduction. Bilateral renal arteries-demonstrated vessel narrowing of 60-99% diameter reduction. Rt Kidney-mid pole lateral simple cyst noted measuring 1.29x0.76x1.11cm and exophytic cyst outside lower pole measuring 1.23x0.96x1.31. Lft Kidney-lateral mid to lower pole simple cyst measuering-1.24x9.83x1.24   RIGHT/LEFT HEART CATH AND CORONARY/GRAFT ANGIOGRAPHY N/A 10/20/2017   Procedure: RIGHT/LEFT HEART CATH AND CORONARY/GRAFT ANGIOGRAPHY;  Surgeon: Burnell Blanks, MD;  Location: [place] [facility];  Service: Cardiovascular;  Laterality: N/A;   TEE WITHOUT CARDIOVERSION N/A 06/25/2015  Procedure: TRANSESOPHAGEAL ECHOCARDIOGRAM (TEE);  Surgeon: Pixie Casino, MD;  Location: [facility];  Service: Cardiovascular;  Laterality: N/A;   TEE WITHOUT CARDIOVERSION N/A 11/02/2016   Procedure: TRANSESOPHAGEAL ECHOCARDIOGRAM (TEE);  Surgeon: [name], MD;  Location: [place];  Service: Cardiovascular;  Laterality: N/A;   TEE WITHOUT CARDIOVERSION N/A 12/05/2018   Procedure: TRANSESOPHAGEAL ECHOCARDIOGRAM (TEE);  Surgeon: Sherren Mocha, MD;  Location: [place];  Service: Open Heart Surgery;  Laterality: N/A;   TONSILLECTOMY     TRANSCATHETER AORTIC VALVE REPLACEMENT, TRANSFEMORAL N/A 12/05/2018   Procedure: TRANSCATHETER AORTIC VALVE REPLACEMENT, TRANSFEMORAL;  Surgeon: Sherren Mocha, MD;  Location: [place];  Service: Open Heart Surgery;  Laterality: N/A;   UPPER GASTROINTESTINAL  ENDOSCOPY  2003   reflux esophagitis, erosive gastropathy, duodenal ulcer     Home Medications:  Prior to Admission medications   Medication Sig Start Date Zacari Stiff Date Taking? Authorizing Provider  amiodarone (PACERONE) 200 MG tablet Take 400mg , 2 tabs, 2 times daily for 2wks; Then reduce 400mg , 2 tabs, 1 time daily for 2wks; then begin 200mg , 1 tablet, once daily. Patient taking differently: Take 200-400 mg by mouth See admin instructions. Take 400 mg by mouth twice daily for 2 weeks, then 400 mg daily for 2 weeks then 200 mg daily 06/22/19  Yes Deboraha Sprang, MD  apixaban (ELIQUIS) 2.5 MG TABS tablet Take 1 tablet (2.5 mg total) by mouth 2 (two) times daily. 06/29/19  Yes [name], MD  Ascorbic Acid (VITAMIN C PO) Take 1 tablet by mouth daily.   Yes [provider]  atorvastatin (LIPITOR) 20 MG tablet Take 1 tablet (20 mg total) by mouth daily. 08/01/18  Yes [name], MD  citalopram (CELEXA) 20 MG tablet Take 1 tablet (20 mg total) by mouth daily. 07/16/19  Yes [name], MD  finasteride (PROSCAR) 5 MG tablet Take 1 tablet (5 mg total) by mouth daily. 04/03/19  Yes [name], MD  guaiFENesin (MUCINEX) 600 MG 12 hr tablet Take 600 mg by mouth 2 (two) times daily as needed for cough or to loosen phlegm.   Yes [provider]  insulin glargine (LANTUS) 100 UNIT/ML injection Inject 0.25 mLs (25 Units total) into the skin 2 (two) times daily. 05/30/19  Yes [name], MD  lisinopril (PRINIVIL,ZESTRIL) 40 MG tablet Take 1 tablet (40 mg total) by mouth daily. 12/14/18 12/09/19 Yes Eileen Stanford, PA-C  metFORMIN (GLUCOPHAGE) 500 MG tablet Take 1 tablet (500 mg total) by mouth 2 (two) times daily with a meal. 05/30/19  Yes [name], MD  pantoprazole (PROTONIX) 20 MG tablet Take 1 tablet (20 mg total) by mouth daily. 02/28/19  Yes [name], MD  potassium chloride SA (K-DUR) 20 MEQ tablet Take 1 tablet (20 mEq total) every Monday,  Wednesday, Friday, Saturday, and Sunday. Patient taking differently: Take 20 mEq by mouth See admin instructions. Take 1 tablet (20 mEq total) every Monday, Wednesday, Friday, Saturday, and Sunday. 04/09/19  Yes [name], MD  tamsulosin (FLOMAX) 0.4 MG CAPS capsule Take 1 capsule (0.4 mg total) by mouth daily after supper. 04/09/19  Yes [name], MD  torsemide (DEMADEX) 20 MG tablet Take 3 tablets (60 mg total) by mouth daily. 07/24/19  Yes Lendon Colonel, NP  traZODone (DESYREL) 50 MG tablet Take 2 tablets (100 mg total) by mouth at bedtime. 07/16/19  Yes [name], MD  acetaminophen (TYLENOL) 500 MG tablet Take 1 tablet (500 mg total) by mouth 3 (three) times daily. Patient taking  differently: Take 1,000 mg by mouth every 8 (eight) hours as needed for mild pain or headache.  02/17/16   [name], MD  BD INSULIN SYRINGE U/F 30G X 1/2" 0.5 ML MISC AS DIRECTED. 03/06/19   [name], MD  glucose blood (ACCU-CHEK AVIVA PLUS) test strip Use as instructed to check blood sugar three times a day. Dx code E11.8 & E11.65 04/10/19   [name], MD  nitroGLYCERIN (NITROSTAT) 0.4 MG SL tablet Place 1 tablet (0.4 mg total) under the tongue every 5 (five) minutes as needed for chest pain. 03/08/18   Almyra Deforest, PA    Inpatient Medications: Scheduled Meds:  amiodarone  200 mg Oral Daily   apixaban  2.5 mg Oral BID   atorvastatin  20 mg Oral Daily   citalopram  20 mg Oral Daily   finasteride  5 mg Oral Daily   furosemide  60 mg Intravenous Q12H   insulin glargine  20 Units Subcutaneous BID   lisinopril  40 mg Oral Daily   mouth rinse  15 mL Mouth Rinse BID   pantoprazole  40 mg Oral Daily   potassium chloride SA  20 mEq Oral Daily   sodium chloride flush  3 mL Intravenous Q12H   tamsulosin  0.4 mg Oral QPC supper   traZODone  50 mg Oral QHS   vitamin C  500 mg Oral Daily   Continuous Infusions:  sodium chloride     PRN Meds: sodium chloride,  acetaminophen, acetaminophen, ondansetron (ZOFRAN) IV, sodium chloride flush  Allergies:    Allergies  Allergen Reactions   Gabapentin Other (See Comments)    Leg pain, weakness    Metoprolol Swelling   Spironolactone Other (See Comments)    Painful gynecomastia   Amlodipine Other (See Comments)    Edema   Other Other (See Comments)    Horse serum - myalgias   Rosiglitazone Maleate Other (See Comments)     Did not help   Tricor [Fenofibrate] Other (See Comments)    myalgias    Social History:   Social History   Socioeconomic History   Marital status: Widowed    Spouse name: Not on file   Number of children: Not on file   Years of education: Not on file   Highest education level: Not on file  Occupational History   Not on file  Social Needs   Financial resource strain: Not on file   Food insecurity    Worry: Not on file    Inability: Not on file   Transportation needs    Medical: Not on file    Non-medical: Not on file  Tobacco Use   Smoking status: Former Smoker    Packs/day: 0.50    Years: 36.00    Pack years: 18.00    Types: Cigarettes    Quit date: 07/23/2013    Years since quitting: 6.0   Smokeless tobacco: Never Used  Substance and Sexual Activity   Alcohol use: No   Drug use: No   Sexual activity: Not Currently  Lifestyle   Physical activity    Days per week: Not on file    Minutes per session: Not on file   Stress: Not on file  Relationships   Social connections    Talks on phone: Not on file    Gets together: Not on file    Attends religious service: Not on file    Active member of club or organization: Not on file  Attends meetings of clubs or organizations: Not on file    Relationship status: Not on file   Intimate partner violence    Fear of current or ex partner: Not on file    Emotionally abused: Not on file    Physically abused: Not on file    Forced sexual activity: Not on file  Other Topics Concern    Not on file  Social History Narrative   Caffeine: 4 cups coffee/day, some tea and soda   Lives with wife   Occupation: Retired   Activity: no regular exercise   Diet: good water, fruits/vegetables daily    Family History:    Family History  Problem Relation Age of Onset   Hypertension Father    Heart disease Father    Cancer Mother        GI cancer   Diabetes Sister    Heart disease Sister    Stroke Sister    Hypertension Sister    Cancer Brother        Lung cancer - nonsmoker   Cancer Maternal Grandmother    Heart attack Maternal Grandfather    Heart attack Paternal Grandmother    Heart attack Paternal Grandfather    Cancer Brother        Pancreatic cancer   Lung disease Brother      ROS:  Please see the history of present illness.  Review of Systems  Constitutional: Positive for malaise/fatigue.  Respiratory: Positive for shortness of breath.   Cardiovascular: Positive for leg swelling. Negative for chest pain, palpitations and orthopnea.  Musculoskeletal: Positive for falls and joint pain.  Neurological: Positive for weakness.  All other systems reviewed and are negative.   All other ROS reviewed and negative.     Physical Exam/Data:   Vitals:   08/13/19 1830 08/13/19 1845 08/13/19 2100 08/14/19 0421  BP:   (!) 118/47 126/64  Pulse: 94 64 73 75  Resp: (!) 27 (!) 25 (!) 24 20  Temp:   98.2 F (36.8 C) (!) 97.4 F (36.3 C)  TempSrc:   Oral Oral  SpO2: 97% 100% 94% 94%  Weight:   104.1 kg 105.6 kg  Height:   6\' 1"  (1.854 m)     Intake/Output Summary (Last 24 hours) at 08/14/2019 0732 Last data filed at 08/14/2019 0421 Gross per 24 hour  Intake 480 ml  Output 1100 ml  Net -620 ml   Last 3 Weights 08/14/2019 08/13/2019 08/13/2019  Weight (lbs) 232 lb 14.4 oz 229 lb 9.6 oz 225 lb  Weight (kg) 105.643 kg 104.146 kg 102.059 kg     Body mass index is 30.73 kg/m.  General: Obese male, somnolent and just woken from the morning HEENT:  normal Neck: JVD difficult to assess due to body habitus Vascular: No carotid bruits; radial pulses 2+ bilaterally Cardiac:  normal S1, S2; RRR; no murmur  Lungs: Coarse breath sounds bilaterally, bibasilar reduced breath sounds Abd: obese, nontender, no hepatomegaly  Ext: 1-2+ b/l lower extremity edema Musculoskeletal:  No deformities, BUE and BLE strength normal and equal Skin: warm and dry  Neuro:  No focal abnormalities noted Psych:  Normal affect   EKG:  The EKG was personally reviewed and demonstrates: Atrial sensed paced rhythm, 71 bpm Telemetry:  Telemetry was personally reviewed and demonstrates: atrial paced, 70s  Relevant CV Studies: Echo 12/26/2018  1. The left ventricle has low normal systolic function, with an ejection fraction of 50-55%. The cavity size was normal. There is moderately  increased left ventricular wall thickness. Left ventricular diastolic Doppler parameters are consistent with  impaired relaxation. Elevated left atrial and left ventricular Mihcael Ledee-diastolic pressures The E/e' is >20. There is abnormal septal motion consistent with RV pacemaker.  2. Moderate hypokinesis of the left ventricular, basal-mid septal wall.  3. The mitral valve is degenerative. Mild thickening of the mitral valve leaflet. Moderate calcification of the mitral valve leaflet. There is moderate mitral annular calcification present. Mild mitral valve stenosis.  4. The tricuspid valve is grossly normal.  5. A 26 an Edwards Edwards Sapien bioprosthetic aortic valve (TAVR) valve is present in the aortic position. Procedure Date: 12/05/2018 Echo findings are consistent with mild perivalvular leak of the aortic prosthesis.  6. Moderate calcification of the aortic valve. mild stenosis of the aortic valve.  7. Left atrial size was moderately dilated.  8. Right atrial size was moderately dilated.  9. The right ventricle has normal systolic function. The cavity was normal. There is no increase in right  ventricular wall thickness. 10. When compared to the prior study: 12/06/2018: LVEF 55-60%.  Pacemaker Check 05/17/2019 Scheduled remote reviewed. Normal device function.   The patient is in a known persistent AF and is on [facility] according to the previous report. AF burden is 34% of the time  Next remote 91 days.  Kathy Breach, RN, CCDS, [employer]  Laboratory Data:  High Sensitivity Troponin:   Recent Labs  Lab 08/10/19 1025 08/13/19 1232 08/13/19 1531  TROPONINIHS 53* 49* 47*     Cardiac EnzymesNo results for input(s): TROPONINI in the last 168 hours. No results for input(s): TROPIPOC in the last 168 hours.  Chemistry Recent Labs  Lab 08/10/19 1025 08/13/19 1232 08/14/19 0539  NA 136 138 142  K 4.5 4.7 4.2  CL 95* 98 101  CO2 29 31 33*  GLUCOSE 295* 251* 81  BUN 57* 42* 39*  CREATININE 2.00* 1.78* 1.76*  CALCIUM 9.1 9.0 9.0  GFRNONAA 30* 34* 35*  GFRAA 34* 40* 40*  ANIONGAP 12 9 8     Recent Labs  Lab 08/10/19 1025 08/13/19 1232  PROT 7.3 7.2  ALBUMIN 3.8 3.7  AST 19 18  ALT 15 16  ALKPHOS 79 81  BILITOT 0.9 0.9   Hematology Recent Labs  Lab 08/10/19 1025 08/13/19 1232 08/14/19 0539  WBC 5.6 5.8 6.1  RBC 3.29* 3.34* 3.18*  HGB 9.7* 9.9* 9.4*  HCT 31.2* 31.4* 29.3*  MCV 94.8 94.0 92.1  MCH 29.5 29.6 29.6  MCHC 31.1 31.5 32.1  RDW 13.2 13.3 13.1  PLT 159 168 157   BNP Recent Labs  Lab 08/13/19 1235  BNP 244.0*    DDimer No results for input(s): DDIMER in the last 168 hours.   Radiology/Studies:  Dg Chest 1 View  Result Date: 08/10/2019 CLINICAL DATA:  Weakness.  Fall.  Left hip pain. EXAM: CHEST  1 VIEW COMPARISON:  05/24/2019 FINDINGS: Previous median sternotomy CABG procedure. There is a left chest wall pacer device with leads in the right atrial appendage and right ventricle. Mild cardiac enlargement. Decreased lung volumes with coarsened interstitial markings noted bilaterally. Chronic atelectasis or scar identified in the left  base. IMPRESSION: 1. Chronic low lung volumes with asymmetric elevation of the left hemidiaphragm and left base atelectasis versus scar. . Electronically Signed   By: Kerby Moors M.D.   On: 08/10/2019 10:58   Dg Chest 2 View  Result Date: 08/13/2019 CLINICAL DATA:  Shortness of breath. EXAM: CHEST - 2  VIEW COMPARISON:  August 10, 2019. FINDINGS: Stable cardiomediastinal silhouette. Atherosclerosis of thoracic aorta is noted. Left-sided pacemaker is unchanged in position. Status post coronary bypass graft and transcatheter aortic valve repair. No pneumothorax or pleural effusion is noted. Right lung is clear. Stable elevated left hemidiaphragm is noted with mild left basilar atelectasis. IMPRESSION: Stable elevated left hemidiaphragm with mild left basilar subsegmental atelectasis. Aortic Atherosclerosis (ICD10-I70.0). Electronically Signed   By: Marijo Conception M.D.   On: 08/13/2019 13:30   Ct Pelvis Wo Contrast  Result Date: 08/10/2019 CLINICAL DATA:  Pelvic trauma, weakness, fall, LEFT hip pain, multiple falls over the past week, negative radiographs EXAM: CT PELVIS WITHOUT CONTRAST TECHNIQUE: Multidetector CT imaging of the pelvis was performed following the standard protocol without intravenous contrast. COMPARISON:  LEFT hip radiographs 08/10/2019 FINDINGS: Urinary Tract:  Ladder and visualized ureters normal appearance Bowel: Diverticulosis of descending and sigmoid colon. Remaining visualized bowel loops unremarkable Vascular/Lymphatic: Atherosclerotic calcifications of the aorta and iliac arteries. Visualized aorta normal caliber. No adenopathy. Reproductive:  Prostatic calcifications. Other:  Tiny RIGHT inguinal hernia containing fat Musculoskeletal: Significant diffuse osseous demineralization. Mild BILATERAL narrowing of the hip joints. Pelvis intact. No pelvic or proximal femoral fracture or dislocation. Degenerative disc and facet disease changes at L4-L5 and L5-S1. IMPRESSION: Significant  diffuse osseous demineralization without acute bony abnormalities. Distal colonic diverticulosis without evidence of diverticulitis. Tiny RIGHT inguinal hernia containing fat. No acute intrapelvic abnormalities. Electronically Signed   By: Lavonia Dana M.D.   On: 08/10/2019 12:26   Dg Hip Unilat W Or Wo Pelvis 2-3 Views Left  Result Date: 08/10/2019 CLINICAL DATA:  Weakness, fall, left hip pain, initial encounter. EXAM: DG HIP (WITH OR WITHOUT PELVIS) 2-3V LEFT COMPARISON:  None. FINDINGS: Osteopenia. No acute osseous or joint abnormality. Mild acetabular sclerosis bilaterally. Degenerative changes are seen in the visualized lumbar spine. Vascular calcifications. IMPRESSION: 1. No acute findings. 2. Osteopenia. 3. Degenerative changes in the spine and minimally within the hips. Electronically Signed   By: Lorin Picket M.D.   On: 08/10/2019 10:53    Assessment and Plan:   Diastolic heart failure --Not currently short of breath; however, he has just woken for the morning.  Feels as if his lower extremity edema has improved since admission.  --Volume up on exam. Consider also that anemia and declining renal function could be contributing to third spacing / LEE. --BNP 202.4 and not terribly elevated with consideration of obesity. --Net -620cc since admission. Wt 104.1kg  105.6kg (232.9lbs).  Dry weight estimated at 218lbs; therefore, it appears he is up quite a bit from his dry weight. Continue to monitor I's/O's, daily weights. --Continue aggressive diuresis with increased dose of IV lasix 80mg  BID.  --Will hold ACE for now given renal function and climbing potassium. --Discontinued potassium supplementation 36mEq for now and due to elevated potassium. PTA regemin was every M,W, F, Sa, Sunday at 18mEq. Will restart at a reduced dose of 49mEq tomorrow and carefully titrate going forward based on daily BMET.[name] --Updated echo pending. Previous echo as above with normal EF. If reduced EF, consider further  ischemic workup as below. Further recommendations at that time. --Will need f/u with the advanced heart failure team.  Elevated HS Tn without chest pain --No current chest pain. --Chronically elevated troponin, likely due to supply demand ischemia in the setting of CKD and the below comorbid conditions. --Pending updated echo to assess EF. If significantly reduced EF or WMA, will need further ischemic workup at that time.  --  Continue medical management as above. Continue statin therapy.  PAF  Pacemaker in situ --Atrially paced. No c/o racing HR or palpitations. --Continue amiodarone 200mg  daily. --Remains on Eliquis. S/p fall with large ecchymosis / hematoma. Blood counts stable. Continue to monitor.  --Consider interrogation of pacemaker to rule out volume retention due to arrhythmia and as unclear why the patient fell (at date unknown). --Follow-up as needed with Dr. Caryl Comes.  Insulin-dependent diabetes, poorly controlled --SSI, per IM. --Poorly controlled DM2 likely contributing to declining renal function.  Hyperkalemia --Due to climbing potassium, hold KCl today and restart tomorrow at reduced dose, as PTA dosing of 20MEq was every other day.  CKD --Consider nephrology consult, as has been recommended to patient in the past. --Daily BMET. --Cr 2.00  1.78 with BUN 57  42.  HLD --Continue statin therapy.    For questions or updates, please contact [place] Please consult [email or web address] for contact info under     Signed, Arvil Chaco, PA-C  08/14/2019 7:32 AM

## 2019-08-14 NOTE — Progress Notes (Signed)
PROGRESS NOTE  Charles [name] [id number] DOB: [birth year] DOA: 08/13/2019 PCP: [name], MD  Brief History   The patient presents to the ED today after having been in the ED last Friday for the same complaints, although he states that they weren't as severe at that time. He was given a one time injection of lasix in the ED and he was sent home on torsemide. He states that his swelling has "doubled" since that time, despite taking an increased dose of the torsemide as directed by cardiology.  He denies fevers, chills, cough, wheezing, nausea, vomiting, chest pain, constipation or diarrhea. He fell once a week ago. He is feeling weak.   In the ED he is found to be saturating in the mid-nineties on 2 Liters which is his baseline oxygen requirement. Creatinine appears to be at about baseline at 1.78. BNP is 244 . This is increased only slightly over the last BNP which was drawn on 05/24/2019. He is afebrile, somewhat hypotensive with a systolic blood pressure of 103/54. Pulse has varied between 71 and 140 as recorded (?). His glucose is 251. EKG demonstrates a ventricularly paced rhythm.   [facility] have been consulted to admit the patient for further evaluation and care. Cardiology [facility]) has been consulted by the ED. Echocardiogram has been ordered. He has received 80 mg lasix IV. He will be admitted to a telemetry bed.  Palliative care has also been consulted.  Consultants   Cardiology  Palliative care  Procedures   None  Antibiotics   Anti-infectives (From admission, onward)   None      Subjective  The patient is resting comfortably. He states that he is feeling a little better.  Objective   Vitals:  Vitals:   08/14/19 0806 08/14/19 1713  BP: (!) 110/58 (!) 144/65  Pulse: 73 84  Resp: 18 18  Temp: 98.2 F (36.8 C)   SpO2: 96% 95%   Exam:  Constitutional:   The patient is awake, alert, and oriented x 3. No acute distress. Respiratory:    Diminished breath sounds bilaterally  No increased work of breathing.  No wheezes, rales, or rhonchi  No tactile fremitus Cardiovascular:   Regular rate and rhythm  No murmurs, ectopy, or gallups.  No lateral PMI. No thrills. Abdomen:   Abdomen is distended, but softer than yesterday.   Positive for body wall edema  No hernias, masses, or organomegaly  Normoactive bowel sounds.  Musculoskeletal:   No cyanosis or clubbing. +3 pitting edema bilaterally. Skin:   No rashes, lesions, ulcers  palpation of skin: no induration or nodules Neurologic:   CN 2-12 intact  Sensation all 4 extremities intact Psychiatric:   Mental status o Mood, affect appropriate o Orientation to person, place, time   judgment and insight appear intact  I have personally reviewed the following:   Today's Data   Vitals, BMP, CBC  Cardiology Data   Echocardiogram: LVEF 60-65%, Grade II diastolic dysfunction, mildly reduced systolic function, moderate pulmonary artery hypertension  Scheduled Meds:  amiodarone  200 mg Oral Daily   apixaban  2.5 mg Oral BID   atorvastatin  20 mg Oral Daily   citalopram  20 mg Oral Daily   finasteride  5 mg Oral Daily   furosemide  80 mg Intravenous Q12H   insulin glargine  20 Units Subcutaneous BID   [START ON 08/15/2019] lisinopril  40 mg Oral Daily   mouth rinse  15 mL Mouth Rinse BID   pantoprazole  40 mg Oral Daily   [START ON 08/15/2019] potassium chloride  10 mEq Oral Once   sodium chloride flush  3 mL Intravenous Q12H   tamsulosin  0.4 mg Oral QPC supper   traZODone  50 mg Oral QHS   vitamin C  500 mg Oral Daily   Continuous Infusions:  sodium chloride      Principal Problem:   Acute on chronic heart failure with preserved ejection fraction (HFpEF) (HCC) Active Problems:   Diabetes mellitus type 2, uncontrolled, with complications ([place])   HYPERCHOLESTEROLEMIA   MDD (major depressive disorder), recurrent episode,  moderate (HCC)   Essential hypertension   GERD   Carotid arterial disease (HCC)   Chronic radicular pain of lower back   Chronic anticoagulation   Acute on chronic diastolic CHF (congestive heart failure) ([place])   Coronary artery disease involving native coronary artery of native heart without angina pectoris   Paroxysmal atrial flutter (HCC)   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 St Jude   S/P TAVR (transcatheter aortic valve replacement)   CKD (chronic kidney disease), stage II   LOS: 1 day   A & P  Acute exacerbation of chronic combined CHF: The patient will be admitted to a telemetry bed. He will be given IV lasix 60 mg bid. This may need to be held due to the patient's marginal blood pressures. Echocardiogram has demonstrated LVEF of 60-65%, Grade II diastolic dysfunction, right sided heart failure, and moderate pulmonary artery hypertension. Cardiology has been consulted. Palliative care also consulted.  Anasarca: Monitor volume status and diureses as allowed by blood pressures and creatinine. The patient has a negative 1.46 L fluid balance today. Continue diuresis.  Hypotension: Resolved.   COPD: Continue nebulizer treatments as needed.   DM II: FSBS qas and hs with SSI to follow glucoses. Continue lantus. The patient will receive a heart healthy, carbohydrate controlled diet. FSBS 225. He is on a hypoglycemic protocol.   S/P pacemaker: Noted and stable. Interrogation as per cardiology. S/P TAVR: Noted with small leak seen on echocardiogram. Stable.  History of atrial flutter: Ventricular rhythm.  I have seen and examined this patient myself. I have spent 34 minutes in his evaluation and care.  CODE STATUS: Full Code DVT Prophylaxis: Eliquis Family communication: None available Disposition: tbd  Yitzchak Kothari, DO [facility] Direct contact: see [email or web address]  7PM-7AM contact night coverage as above 08/14/2019, 5:20 PM  LOS: 1 day

## 2019-08-14 NOTE — Plan of Care (Signed)
  Problem: Education: Goal: Knowledge of General Education information will improve Description: Including pain rating scale, medication(s)/side effects and non-pharmacologic comfort measures Outcome: Progressing   Problem: Health Behavior/Discharge Planning: Goal: Ability to manage health-related needs will improve Outcome: Progressing   Problem: Clinical Measurements: Goal: Ability to maintain clinical measurements within normal limits will improve Outcome: Progressing Goal: Will remain free from infection Outcome: Progressing Goal: Diagnostic test results will improve Outcome: Progressing Goal: Respiratory complications will improve Outcome: Progressing Goal: Cardiovascular complication will be avoided Outcome: Progressing   Problem: Activity: Goal: Risk for activity intolerance will decrease Outcome: Progressing   Problem: Nutrition: Goal: Adequate nutrition will be maintained Outcome: Progressing   Problem: Coping: Goal: Level of anxiety will decrease Outcome: Progressing   Problem: Elimination: Goal: Will not experience complications related to bowel motility Outcome: Progressing Goal: Will not experience complications related to urinary retention Outcome: Progressing   Problem: Pain Managment: Goal: General experience of comfort will improve Outcome: Progressing   Problem: Safety: Goal: Ability to remain free from injury will improve Outcome: Progressing   Problem: Skin Integrity: Goal: Risk for impaired skin integrity will decrease Outcome: Progressing   Problem: Education: Goal: Ability to demonstrate management of disease process will improve Outcome: Progressing Goal: Ability to verbalize understanding of medication therapies will improve Outcome: Progressing Goal: Individualized Educational Video(s) Outcome: Progressing   Problem: Activity: Goal: Capacity to carry out activities will improve Outcome: Progressing   Problem: Cardiac: Goal:  Ability to achieve and maintain adequate cardiopulmonary perfusion will improve Outcome: Progressing   Problem: Activity: Goal: Capacity to carry out activities will improve Outcome: Progressing   Problem: Cardiac: Goal: Ability to achieve and maintain adequate cardiopulmonary perfusion will improve Outcome: Progressing   Problem: Health Behavior/Discharge Planning: Goal: Ability to manage health-related needs will improve Outcome: Progressing   Problem: Clinical Measurements: Goal: Ability to maintain clinical measurements within normal limits will improve Outcome: Progressing Goal: Will remain free from infection Outcome: Progressing Goal: Diagnostic test results will improve Outcome: Progressing Goal: Respiratory complications will improve Outcome: Progressing   Problem: Activity: Goal: Risk for activity intolerance will decrease Outcome: Progressing   Problem: Nutrition: Goal: Adequate nutrition will be maintained Outcome: Progressing   Problem: Coping: Goal: Level of anxiety will decrease Outcome: Progressing   Problem: Elimination: Goal: Will not experience complications related to bowel motility Outcome: Progressing   Problem: Pain Managment: Goal: General experience of comfort will improve Outcome: Progressing   Problem: Safety: Goal: Ability to remain free from injury will improve Outcome: Progressing   Problem: Skin Integrity: Goal: Risk for impaired skin integrity will decrease Outcome: Progressing

## 2019-08-14 NOTE — Progress Notes (Deleted)
Cardiology Clinic Note   Patient Name: Charles [name] Date of Encounter: 08/14/2019  Primary Care Provider:  [name], MD Primary Cardiologist:  [name], MD  Patient Profile    Charles [name] 85 year old male presents today for follow-up of his lower extremity edema and increased shortness of breath.  Past Medical History    Past Medical History:  Diagnosis Date  . Abdominal aortic atherosclerosis ([place])    by xray  . Arthritis    in lower back (06/24/2015)  . Atrial flutter ([place])    notes 06/24/2015  . AV block, [place] 1 09/06/2017   Archie Endo 09/06/2017  . 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 CAD (coronary artery disease) 2004   a. s/p CABG in 2004 with LIMA-LAD, SVG-RCA, SVG-OM, and SVG-RI  . Carotid stenosis 1999   s/p L CEA  . Chronic diastolic CHF (congestive heart failure) ([place])   . Colon polyp 2005   (Dr. Tiffany Kocher)  . Compression fracture of L1 lumbar vertebra (HCC) remote  . COPD (chronic obstructive pulmonary disease) ([place]) 03/2011   by xray  . Depression    hx  . GERD (gastroesophageal reflux disease) 2003   h/o duodenal ulcer per EGD as well as esophagitis  . History of colon polyps 2003, 2005   adenomatous Vira Agar)  . History of tobacco abuse   . HLD (hyperlipidemia)   . HTN (hypertension)   . Presence of permanent cardiac pacemaker   . Psoriasis   . Renal artery stenosis ([place]) 2004   70% bilateral, followed by cards  . 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 Severe aortic stenosis   . T2DM (type 2 diabetes mellitus) ([place]) 1995  . Urge incontinence of urine    Past Surgical History:  Procedure Laterality Date  . CARDIAC CATHETERIZATION  03/06/2003   No intervention - recommend CABG  . CARDIOVASCULAR STRESS TEST  11/2010   normal perfusion, no evidence of ischemia, EF 62% post exercise  .  CARDIOVASCULAR STRESS TEST  11/28/2012   Mild diaphragmatic attenuation; cannot exclude a focal region of nontransmural inferior scar  . CARDIOVERSION N/A 06/25/2015   Procedure: CARDIOVERSION;  Surgeon: Pixie Casino, MD;  [facility] ENDOSCOPY;  Service: Cardiovascular;  Laterality: N/A;  . CARDIOVERSION N/A 11/02/2016   Procedure: CARDIOVERSION;  Surgeon: [name], MD;  Location: [place];  Service: Cardiovascular;  Laterality: N/A;  . CARDIOVERSION N/A 03/20/2019   Procedure: CARDIOVERSION;  Surgeon: Jerline Pain, MD;  Location: [place];  Service: Cardiovascular;  Laterality: N/A;  . CAROTID ENDARTERECTOMY Left 1999   ([place])  . CATARACT EXTRACTION W/ INTRAOCULAR LENS  IMPLANT, BILATERAL Bilateral 01/2013   [place]  . COLONOSCOPY  2003   colon polyp x3 - adenomatous Tiffany Kocher)  . COLONOSCOPY  10/08/2012   2 TA, diverticulosis, int hem, no rpt rec Tiffany Kocher)  . CORONARY ANGIOPLASTY    . CORONARY ARTERY BYPASS GRAFT  03/07/2003   4v CABG ([place]) with LIMA to LAD, vein graft to RCA, 1st obtuse marginal, and ramus intermedius  . ESOPHAGOGASTRODUODENOSCOPY  10/08/2012   nl esophagus, duodenitis and erosive gastropathy, path - gastropathy no Hpylori, no rpt rec  . LUMBAR EPIDURAL INJECTION  03/2017   L5/S1 ([place])  . PACEMAKER IMPLANT N/A 04/21/2018   Procedure: PACEMAKER IMPLANT -- Dual Chamber;  Surgeon: Deboraha Sprang, MD;  Location:  [place] INVASIVE CV LAB;  Service: Cardiovascular;  Laterality: N/A;  . PERIPHERAL VASCULAR CATHETERIZATION N/A 02/02/2016   Procedure: Renal Angiography;  Surgeon: [name], MD;  Location: [place] CV LAB;  Service: Cardiovascular;  Laterality: N/A;  . PERIPHERAL VASCULAR CATHETERIZATION N/A 02/02/2016   Procedure: Abdominal Aortogram;  Surgeon: [name], MD;  Location: [place] CV LAB;  Service: Cardiovascular;  Laterality: N/A;  . RENAL DOPPLER  11/29/2011   Celiac&SMA-demonstrated vessel narrowing suggestive of a greater than 50%  diameter reduction. Bilateral renal arteries-demonstrated vessel narrowing of 60-99% diameter reduction. Rt Kidney-mid pole lateral simple cyst noted measuring 1.29x0.76x1.11cm and exophytic cyst outside lower pole measuring 1.23x0.96x1.31. Lft Kidney-lateral mid to lower pole simple cyst measuering-1.24x9.83x1.24  . RIGHT/LEFT HEART CATH AND CORONARY/GRAFT ANGIOGRAPHY N/A 10/20/2017   Procedure: RIGHT/LEFT HEART CATH AND CORONARY/GRAFT ANGIOGRAPHY;  Surgeon: Burnell Blanks, MD;  Location: [place] CV LAB;  Service: Cardiovascular;  Laterality: N/A;  . TEE WITHOUT CARDIOVERSION N/A 06/25/2015   Procedure: TRANSESOPHAGEAL ECHOCARDIOGRAM (TEE);  Surgeon: Pixie Casino, MD;  Location: [facility] ENDOSCOPY;  Service: Cardiovascular;  Laterality: N/A;  . TEE WITHOUT CARDIOVERSION N/A 11/02/2016   Procedure: TRANSESOPHAGEAL ECHOCARDIOGRAM (TEE);  Surgeon: [name], MD;  Location: [place];  Service: Cardiovascular;  Laterality: N/A;  . TEE WITHOUT CARDIOVERSION N/A 12/05/2018   Procedure: TRANSESOPHAGEAL ECHOCARDIOGRAM (TEE);  Surgeon: Sherren Mocha, MD;  Location: [place];  Service: Open Heart Surgery;  Laterality: N/A;  . TONSILLECTOMY    . TRANSCATHETER AORTIC VALVE REPLACEMENT, TRANSFEMORAL N/A 12/05/2018   Procedure: TRANSCATHETER AORTIC VALVE REPLACEMENT, TRANSFEMORAL;  Surgeon: Sherren Mocha, MD;  Location: [place];  Service: Open Heart Surgery;  Laterality: N/A;  . UPPER GASTROINTESTINAL ENDOSCOPY  2003   reflux esophagitis, erosive gastropathy, duodenal ulcer    Allergies  Allergies  Allergen Reactions  . Gabapentin Other (See Comments)    Leg pain, weakness   . Metoprolol Swelling  . Spironolactone Other (See Comments)    Painful gynecomastia  . Amlodipine Other (See Comments)    Edema  . Other Other (See Comments)    Horse serum - myalgias  . Rosiglitazone Maleate Other (See Comments)     Did not help  . Tricor [Fenofibrate] Other (See Comments)    myalgias     History of Present Illness    Mr. Baliles has a past medical history of paroxysmal atrial flutter, essential hypertension, acute on chronic diastolic CHF with preserved ejection fraction, chronic respiratory failure, GERD, diabetes mellitus type 2, CKD stage II, pacemaker placement (04/2018) [place], and hyperlipidemia.    Home Medications    Prior to Admission medications   Medication Sig Start Date End Date Taking? Authorizing Provider  acetaminophen (TYLENOL) 500 MG tablet Take 1 tablet (500 mg total) by mouth 3 (three) times daily. Patient taking differently: Take 1,000 mg by mouth every 8 (eight) hours as needed for mild pain or headache.  02/17/16   [name], MD  amiodarone (PACERONE) 200 MG tablet Take 400mg , 2 tabs, 2 times daily for 2wks; Then reduce 400mg , 2 tabs, 1 time daily for 2wks; then begin 200mg , 1 tablet, once daily. Patient taking differently: Take 200-400 mg by mouth See admin instructions. Take 400 mg by mouth twice daily for 2 weeks, then 400 mg daily for 2 weeks then 200 mg daily 06/22/19   Deboraha Sprang, MD  apixaban (ELIQUIS) 2.5 MG TABS tablet Take 1 tablet (2.5 mg total) by mouth 2 (two) times daily. 06/29/19   [name],  Garlon Hatchet, MD  Ascorbic Acid (VITAMIN C PO) Take 1 tablet by mouth daily.    [provider]  atorvastatin (LIPITOR) 20 MG tablet Take 1 tablet (20 mg total) by mouth daily. 08/01/18   [name], MD  BD INSULIN SYRINGE U/F 30G X 1/2" 0.5 ML MISC AS DIRECTED. 03/06/19   [name], MD  citalopram (CELEXA) 20 MG tablet Take 1 tablet (20 mg total) by mouth daily. 07/16/19   [name], MD  finasteride (PROSCAR) 5 MG tablet Take 1 tablet (5 mg total) by mouth daily. 04/03/19   [name], MD  glucose blood (ACCU-CHEK AVIVA PLUS) test strip Use as instructed to check blood sugar three times a day. Dx code E11.8 & E11.65 04/10/19   [name], MD  guaiFENesin (MUCINEX) 600 MG 12 hr tablet Take 600 mg by mouth  2 (two) times daily as needed for cough or to loosen phlegm.    [provider]  insulin glargine (LANTUS) 100 UNIT/ML injection Inject 0.25 mLs (25 Units total) into the skin 2 (two) times daily. 05/30/19   [name], MD  lisinopril (PRINIVIL,ZESTRIL) 40 MG tablet Take 1 tablet (40 mg total) by mouth daily. 12/14/18 12/09/19  Eileen Stanford, PA-C  metFORMIN (GLUCOPHAGE) 500 MG tablet Take 1 tablet (500 mg total) by mouth 2 (two) times daily with a meal. 05/30/19   [name], MD  nitroGLYCERIN (NITROSTAT) 0.4 MG SL tablet Place 1 tablet (0.4 mg total) under the tongue every 5 (five) minutes as needed for chest pain. 03/08/18   Almyra Deforest, PA  pantoprazole (PROTONIX) 20 MG tablet Take 1 tablet (20 mg total) by mouth daily. 02/28/19   [name], MD  potassium chloride SA (K-DUR) 20 MEQ tablet Take 1 tablet (20 mEq total) every Monday, Wednesday, Friday, Saturday, and Sunday. Patient taking differently: Take 20 mEq by mouth See admin instructions. Take 1 tablet (20 mEq total) every Monday, Wednesday, Friday, Saturday, and Sunday. 04/09/19   [name], MD  tamsulosin (FLOMAX) 0.4 MG CAPS capsule Take 1 capsule (0.4 mg total) by mouth daily after supper. 04/09/19   [name], MD  torsemide (DEMADEX) 20 MG tablet Take 3 tablets (60 mg total) by mouth daily. 07/24/19   Lendon Colonel, NP  traZODone (DESYREL) 50 MG tablet Take 2 tablets (100 mg total) by mouth at bedtime. 07/16/19   [name], MD    Family History    Family History  Problem Relation Age of Onset  . Hypertension Father   . Heart disease Father   . Cancer Mother        GI cancer  . Diabetes Sister   . Heart disease Sister   . Stroke Sister   . Hypertension Sister   . Cancer Brother        Lung cancer - nonsmoker  . Cancer Maternal Grandmother   . Heart attack Maternal Grandfather   . Heart attack Paternal Grandmother   . Heart attack Paternal Grandfather   . Cancer Brother         Pancreatic cancer  . Lung disease Brother    He indicated that his mother is deceased. He indicated that his father is deceased. He indicated that his sister is alive. He indicated that all of his three brothers are deceased. He indicated that his maternal grandmother is deceased. He indicated that his maternal grandfather is deceased. He indicated that his paternal grandmother is deceased. He indicated that his paternal grandfather is deceased.  Social History    Social History   Socioeconomic History  . Marital status: Widowed    Spouse name: Not on file  . Number of children: Not on file  . Years of education: Not on file  . Highest education level: Not on file  Occupational History  . Not on file  Social Needs  . Financial resource strain: Not on file  . Food insecurity    Worry: Not on file    Inability: Not on file  . Transportation needs    Medical: Not on file    Non-medical: Not on file  Tobacco Use  . Smoking status: Former Smoker    Packs/day: 0.50    Years: 36.00    Pack years: 18.00    Types: Cigarettes    Quit date: 07/23/2013    Years since quitting: 6.0  . Smokeless tobacco: Never Used  Substance and Sexual Activity  . Alcohol use: No  . Drug use: No  . Sexual activity: Not Currently  Lifestyle  . Physical activity    Days per week: Not on file    Minutes per session: Not on file  . Stress: Not on file  Relationships  . Social Herbalist on phone: Not on file    Gets together: Not on file    Attends religious service: Not on file    Active member of club or organization: Not on file    Attends meetings of clubs or organizations: Not on file    Relationship status: Not on file  . Intimate partner violence    Fear of current or ex partner: Not on file    Emotionally abused: Not on file    Physically abused: Not on file    Forced sexual activity: Not on file  Other Topics Concern  . Not on file  Social History Narrative    Caffeine: 4 cups coffee/day, some tea and soda   Lives with wife   Occupation: Retired   Activity: no regular exercise   Diet: good water, fruits/vegetables daily     Review of Systems    General:  No chills, fever, night sweats or weight changes.  Cardiovascular:  No chest pain, dyspnea on exertion, edema, orthopnea, palpitations, paroxysmal nocturnal dyspnea. Dermatological: No rash, lesions/masses Respiratory: No cough, dyspnea Urologic: No hematuria, dysuria Abdominal:   No nausea, vomiting, diarrhea, bright red blood per rectum, melena, or hematemesis Neurologic:  No visual changes, wkns, changes in mental status. All other systems reviewed and are otherwise negative except as noted above.  Physical Exam    VS:  There were no vitals taken for this visit. , BMI There is no height or weight on file to calculate BMI. GEN: Well nourished, well developed, in no acute distress. HEENT: normal. Neck: Supple, no JVD, carotid bruits, or masses. Cardiac: RRR, no murmurs, rubs, or gallops. No clubbing, cyanosis, edema.  Radials/DP/PT 2+ and equal bilaterally.  Respiratory:  Respirations regular and unlabored, clear to auscultation bilaterally. GI: Soft, nontender, nondistended, BS + x 4. MS: no deformity or atrophy. Skin: warm and dry, no rash. Neuro:  Strength and sensation are intact. Psych: Normal affect.  Accessory Clinical Findings    ECG personally reviewed by me today- *** - No acute changes  Assessment & Plan   1.  ***  Jossie Ng. [facility] [place] [address] Office [phone] Fax [phone]

## 2019-08-14 NOTE — Progress Notes (Signed)
*  PRELIMINARY RESULTS* Echocardiogram 2D Echocardiogram has been performed.  Charles [name] 08/14/2019, 9:03 AM

## 2019-08-14 NOTE — Progress Notes (Signed)
Please note patient has a pending Designer, jewellery Palliative referral. CSW [place] Scrape made aware. Flo Shanks BSN, RN, [place] [phone]

## 2019-08-15 ENCOUNTER — Ambulatory Visit: Payer: Medicare Other | Admitting: General Practice

## 2019-08-15 DIAGNOSIS — Z7901 Long term (current) use of anticoagulants: Secondary | ICD-10-CM

## 2019-08-15 DIAGNOSIS — I10 Essential (primary) hypertension: Secondary | ICD-10-CM

## 2019-08-15 DIAGNOSIS — Z950 Presence of cardiac pacemaker: Secondary | ICD-10-CM

## 2019-08-15 LAB — BASIC METABOLIC PANEL
Anion gap: 9 (ref 5–15)
BUN: 36 mg/dL — ABNORMAL HIGH (ref 8–23)
CO2: 32 mmol/L (ref 22–32)
Calcium: 8.9 mg/dL (ref 8.9–10.3)
Chloride: 98 mmol/L (ref 98–111)
Creatinine, Ser: 1.57 mg/dL — ABNORMAL HIGH (ref 0.61–1.24)
GFR calc Af Amer: 46 mL/min — ABNORMAL LOW (ref >60)
GFR calc non Af Amer: 40 mL/min — ABNORMAL LOW (ref >60)
Glucose, Bld: 103 mg/dL — ABNORMAL HIGH (ref 70–99)
Potassium: 3.9 mmol/L (ref 3.5–5.1)
Sodium: 139 mmol/L (ref 135–145)

## 2019-08-15 LAB — GLUCOSE, CAPILLARY
Glucose-Capillary: 140 mg/dL — ABNORMAL HIGH (ref 70–99)
Glucose-Capillary: 67 mg/dL — ABNORMAL LOW (ref 70–99)
Glucose-Capillary: 196 mg/dL — ABNORMAL HIGH (ref 70–99)
Glucose-Capillary: 203 mg/dL — ABNORMAL HIGH (ref 70–99)
Glucose-Capillary: 235 mg/dL — ABNORMAL HIGH (ref 70–99)

## 2019-08-15 MED ORDER — INSULIN GLARGINE 100 UNIT/ML ~~LOC~~ SOLN
15.0000 [IU] | Freq: Two times a day (BID) | SUBCUTANEOUS | Status: DC
  Administered 2019-08-15 – 2019-08-16 (×3): 15 [IU] via SUBCUTANEOUS
  Filled 2019-08-15 (×5): qty 0.15

## 2019-08-15 NOTE — Progress Notes (Signed)
PROGRESS NOTE    Charles [name]  [id number] DOB: 29-Apr-1932 DOA: 08/13/2019 PCP: [name], MD (Confirm with patient/family/NH records and if not entered, this HAS to be entered at [facility] point of entry. "No PCP" if truly none.)   Brief Narrative:  The patient presents to the ED today after having been in the ED last Friday for the same complaints, although he states that they weren't as severe at that time. He was given a one time injection of lasix in the ED and he was sent home on torsemide. He states that his swelling has "doubled" since that time, despite taking an increased dose of the torsemide as directed by cardiology.  He denies fevers, chills, cough, wheezing, nausea, vomiting, chest pain, constipation or diarrhea. He fell once a week ago. He is feeling weak.   In the ED he is found to be saturating in the mid-nineties on 2 Liters which is his baseline oxygen requirement. Creatinine appears to be at about baseline at 1.78. BNP is 244 . This is increased only slightly over the last BNP which was drawn on 05/24/2019. He is afebrile, somewhat hypotensive with a systolic blood pressure of 103/54. Pulse has varied between 71 and 140 as recorded (?). His glucose is 251. EKG demonstrates a ventricularly paced rhythm.   [facility] have been consulted to admit the patient for further evaluation and care. Cardiology [facility]) has been consulted by the ED. Echocardiogram has been ordered.  Patient started on IV Lasix.  Cardiology following.    Assessment & Plan:   Principal Problem:   Acute on chronic heart failure with preserved ejection fraction (HFpEF) (HCC) Active Problems:   Diabetes mellitus type 2, uncontrolled, with complications ([place])   HYPERCHOLESTEROLEMIA   MDD (major depressive disorder), recurrent episode, moderate (HCC)   Essential hypertension   GERD   Carotid arterial disease (HCC)   Chronic radicular pain of lower back   Chronic anticoagulation   Acute  on chronic diastolic CHF (congestive heart failure) (HCC)   Coronary artery disease involving native coronary artery of native heart without angina pectoris   Paroxysmal atrial flutter (HCC)   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  Cardiac enzymes minimally elevated however seem to plateaued.  BNP was 244.  Patient denies any ongoing chest pain.  Patient then states improvement with shortness of breath and symptoms.  2D echo with a EF of 60 to 65%, mildly increased LVH, elevated left atrial pressure, grade 2 diastolic dysfunction, mildly reduced right ventricular systolic function.  Patient with a urine output of 3.2 L over the past 24 hours.  Patient is -3.8 L during this hospitalization.  Current weight of 234 pounds from 232 pounds from 229 pounds.  Weights do not seem to be accurate as patient is -3.8 L during his hospitalization with clinical improvement.  Creatinine started to trend back down.  Continue Lasix 80 mg IV every 12 hours, Lipitor, lisinopril.  Cardiology following and appreciate input and recommendations.  2.  Hypotension Resolved.  Monitor closely with diuresis.  3.  COPD Stable.  Continue nebs as needed.  4.  Poorly controlled diabetes mellitus type 2 Hemoglobin A1c noted to be 11.0 02/23/2019.  CBG of 67 this morning.  Repeat hemoglobin A1c.  Will change Lantus to 15 units twice daily.  Continue sliding  scale insulin.  Diabetes coordinator following.  5.  History of paroxysmal atrial flutter Currently V paced.  Continue amiodarone.  Eliquis.  Cardiology following.  6.  Bilateral renal artery stenosis Patient started on an ACE inhibitor.  Monitor renal function closely.  Continue statin.  7.  Status post TAVR/status post PPM Pacemaker interrogated per cardiology.  Small leak noted  on echocardiogram from TAVR.  Per cardiology.   DVT prophylaxis: Eliquis Code Status: Full Family Communication: Updated patient.  No family at bedside. Disposition Plan: Likely home when clinically improved and cleared by cardiology with palliative care following.   Consultants:   Palliative care: Asencion Gowda, NP 08/14/2019  Cardiology: Dr. Harrell Gave End 08/14/2019  Procedures:   2D echo 08/14/2019  Chest x-ray 08/13/2019  Antimicrobials:   None   Subjective: States SOB improving. Wants to go home.   Objective: Vitals:   08/14/19 1713 08/14/19 2006 08/15/19 0512 08/15/19 0753  BP: (!) 144/65 122/63 (!) 101/59 (!) 112/58  Pulse: 84 76 72 73  Resp: 18 20 20 18   Temp:  97.9 F (36.6 C) (!) 97.4 F (36.3 C) 98.2 F (36.8 C)  TempSrc:  Oral Oral Oral  SpO2: 95% 97% 95% 97%  Weight:   106.1 kg   Height:        Intake/Output Summary (Last 24 hours) at 08/15/2019 1008 Last data filed at 08/15/2019 0958 Gross per 24 hour  Intake 240 ml  Output 3200 ml  Net -2960 ml   Filed Weights   08/13/19 2100 08/14/19 0421 08/15/19 0512  Weight: 104.1 kg 105.6 kg 106.1 kg    Examination:  General exam: Appears calm and comfortable  Respiratory system: CTAB.  No wheezes, no crackles, no rhonchi.  Respiratory effort normal. Cardiovascular system: S1 & S2 heard, RRR. No JVD, murmurs, rubs, gallops or clicks. Trace - 1+ BLE edema No pedal edema. Gastrointestinal system: Abdomen is nondistended, soft and nontender. No organomegaly or masses felt. Normal bowel sounds heard. Central nervous system: Alert and oriented. No focal neurological deficits. Extremities: Symmetric 5 x 5 power. Skin: No rashes, lesions or ulcers Psychiatry: Judgement and insight appear normal. Mood & affect appropriate.     Data Reviewed: I have personally reviewed following labs and imaging studies  CBC: Recent Labs  Lab 08/10/19 1025 08/13/19 1232 08/14/19 0539  WBC 5.6 5.8 6.1   NEUTROABS 4.1 4.1  --   HGB 9.7* 9.9* 9.4*  HCT 31.2* 31.4* 29.3*  MCV 94.8 94.0 92.1  PLT 159 168 [id number]   Basic Metabolic Panel: Recent Labs  Lab 08/10/19 1025 08/13/19 1232 08/14/19 0539 08/15/19 0432  NA 136 138 142 139  K 4.5 4.7 4.2 3.9  CL 95* 98 101 98  CO2 29 31 33* 32  GLUCOSE 295* 251* 81 103*  BUN 57* 42* 39* 36*  CREATININE 2.00* 1.78* 1.76* 1.57*  CALCIUM 9.1 9.0 9.0 8.9   GFR: Estimated Creatinine Clearance: 44.8 mL/min (A) (by C-G formula based on SCr of 1.57 mg/dL (H)). Liver Function Tests: Recent Labs  Lab 08/10/19 1025 08/13/19 1232  AST 19 18  ALT 15 16  ALKPHOS 79 81  BILITOT 0.9 0.9  PROT 7.3 7.2  ALBUMIN 3.8 3.7   No results for input(s): LIPASE, AMYLASE in the last 168 hours. No results for input(s): AMMONIA in the last 168 hours. Coagulation Profile: Recent Labs  Lab 08/10/19 1025  INR 1.1   Cardiac Enzymes: No results for input(s): CKTOTAL, CKMB, CKMBINDEX, TROPONINI in the last 168  hours. BNP (last 3 results) Recent Labs    01/26/19 0000  PROBNP 1,162*   HbA1C: No results for input(s): HGBA1C in the last 72 hours. CBG: Recent Labs  Lab 08/13/19 2116 08/14/19 2112 08/15/19 0754  GLUCAP 225* 355* 67*   Lipid Profile: No results for input(s): CHOL, HDL, LDLCALC, TRIG, CHOLHDL, LDLDIRECT in the last 72 hours. Thyroid Function Tests: No results for input(s): TSH, T4TOTAL, FREET4, T3FREE, THYROIDAB in the last 72 hours. Anemia Panel: No results for input(s): VITAMINB12, FOLATE, FERRITIN, TIBC, IRON, RETICCTPCT in the last 72 hours. Sepsis Labs: No results for input(s): PROCALCITON, LATICACIDVEN in the last 168 hours.  Recent Results (from the past 240 hour(s))  SARS CORONAVIRUS 2 (TAT 6-24 HRS) Nasopharyngeal Nasopharyngeal Swab     Status: None   Collection Time: 08/10/19 10:25 AM   Specimen: Nasopharyngeal Swab  Result Value Ref Range Status   SARS Coronavirus 2 NEGATIVE NEGATIVE Final    Comment: (NOTE) SARS-CoV-2  target nucleic acids are NOT DETECTED. The SARS-CoV-2 RNA is generally detectable in upper and lower respiratory specimens during the acute phase of infection. Negative results do not preclude SARS-CoV-2 infection, do not rule out co-infections with other pathogens, and should not be used as the sole basis for treatment or other patient management decisions. Negative results must be combined with clinical observations, patient history, and epidemiological information. The expected result is Negative. Fact Sheet for Patients: SugarRoll.be Fact Sheet for Healthcare Providers: https://www.woods-mathews.com/ This test is not yet approved or cleared by the [place] FDA and  has been authorized for detection and/or diagnosis of SARS-CoV-2 by FDA under an Emergency Use Authorization (EUA). This EUA will remain  in effect (meaning this test can be used) for the duration of the COVID-19 declaration under Section 56 4(b)(1) of the Act, 21 U.S.C. section 360bbb-3(b)(1), unless the authorization is terminated or revoked sooner. Performed at [facility], [place] [address] [place], [place] [address]   SARS CORONAVIRUS 2 (TAT 6-24 HRS) Nasopharyngeal Nasopharyngeal Swab     Status: None   Collection Time: 08/13/19  5:07 PM   Specimen: Nasopharyngeal Swab  Result Value Ref Range Status   SARS Coronavirus 2 NEGATIVE NEGATIVE Final    Comment: (NOTE) SARS-CoV-2 target nucleic acids are NOT DETECTED. The SARS-CoV-2 RNA is generally detectable in upper and lower respiratory specimens during the acute phase of infection. Negative results do not preclude SARS-CoV-2 infection, do not rule out co-infections with other pathogens, and should not be used as the sole basis for treatment or other patient management decisions. Negative results must be combined with clinical observations, patient history, and epidemiological information. The expected result is  Negative. Fact Sheet for Patients: SugarRoll.be Fact Sheet for Healthcare Providers: https://www.woods-mathews.com/ This test is not yet approved or cleared by the [place] FDA and  has been authorized for detection and/or diagnosis of SARS-CoV-2 by FDA under an Emergency Use Authorization (EUA). This EUA will remain  in effect (meaning this test can be used) for the duration of the COVID-19 declaration under Section 56 4(b)(1) of the Act, 21 U.S.C. section 360bbb-3(b)(1), unless the authorization is terminated or revoked sooner. Performed at [facility], [place] [address], [place], [place] [address]          Radiology Studies: Dg Chest 2 View  Result Date: 08/13/2019 CLINICAL DATA:  Shortness of breath. EXAM: CHEST - 2 VIEW COMPARISON:  August 10, 2019. FINDINGS: Stable cardiomediastinal silhouette. Atherosclerosis of thoracic aorta is noted. Left-sided  pacemaker is unchanged in position. Status post coronary bypass graft and transcatheter aortic valve repair. No pneumothorax or pleural effusion is noted. Right lung is clear. Stable elevated left hemidiaphragm is noted with mild left basilar atelectasis. IMPRESSION: Stable elevated left hemidiaphragm with mild left basilar subsegmental atelectasis. Aortic Atherosclerosis (ICD10-I70.0). Electronically Signed   By: Marijo Conception M.D.   On: 08/13/2019 13:30        Scheduled Meds: . amiodarone  200 mg Oral Daily  . apixaban  2.5 mg Oral BID  . atorvastatin  20 mg Oral Daily  . citalopram  20 mg Oral Daily  . finasteride  5 mg Oral Daily  . furosemide  80 mg Intravenous Q12H  . insulin aspart  0-5 Units Subcutaneous QHS  . insulin aspart  0-9 Units Subcutaneous TID WC  . insulin glargine  15 Units Subcutaneous BID  . lisinopril  40 mg Oral Daily  . mouth rinse  15 mL Mouth Rinse BID  . pantoprazole  40 mg Oral Daily  . sodium chloride flush  3 mL Intravenous Q12H  . sodium  chloride flush  3 mL Intravenous Q12H  . tamsulosin  0.4 mg Oral QPC supper  . traZODone  50 mg Oral QHS  . vitamin C  500 mg Oral Daily   Continuous Infusions: . sodium chloride       LOS: 2 days    Time spent: 35 minutes    Irine Seal, MD [facility]  If 7PM-7AM, please contact night-coverage [email or web address] 08/15/2019, 10:08 AM

## 2019-08-15 NOTE — Evaluation (Signed)
Occupational Therapy Evaluation Patient Details Name: Charles [name] MRN: [id number] DOB: April 02, 1932 Today's Date: 08/15/2019    History of Present Illness Pt is an 85 y.o. male presenting to hospital 11/9 with B LE swelling, abdominal swelling, and increased SOB.  Of note, pt with recent fall with hip pain and in ED 11/6 (imaging negative for acute finding).  Pt admitted with diastolic CHF.  PMH includes atrial flutter, CHF, compression fx L1, COPD, htn, pacemaker, s/p TAVR, CKD, CABG.   Clinical Impression   Pt is 85 year old male who presents to [facility] hospital with BLE swelling, abdominal swelling and increase in SOB. He lives at home alone but has several children who check on him daily and his daughter brings him dinner every night.  He is on 2L of O2 at home and currently on 2.5L with active wheezing and SOB while seated in recliner for ADL assessment.  Recliner foot rest raised at end of session to help with edema in BLEs. Discussed rec for self feeding adaptations with son [name] as well as goals for OT.  Pt becomes fatigued quickly and started education about pursed lip breathing but pt was starting to get sleepy so session was ended.  Pt currently requires min to mod assist for ADLs due to SOB, decreased endurance for functional tasks and at risk for falls.  Pt would benefit from skilled OT services to increase independence in ADLs including feeding adaptations (buitl up and weighted utensils, plate guard and divided plate and reacher for LB dsg ), education in energy conservation techniques, pursed lip breathing and recommendations for home modifications to increase safety and prevent falls.  Rec SNF after discharge.    Follow Up Recommendations  SNF    Equipment Recommendations  Other (comment)(reacher and feeding adatptations like plate guard, suction bowl, weighted and large handle utensils)    Recommendations for Other Services       Precautions / Restrictions  Precautions Precautions: Fall Restrictions Weight Bearing Restrictions: No      Mobility Bed Mobility Overal bed mobility: Needs Assistance Bed Mobility: Supine to Sit     Supine to sit: Min assist;HOB elevated     General bed mobility comments: assist for trunk; increased effort to perform; use of bed rails  Transfers Overall transfer level: Needs assistance Equipment used: Rolling walker (2 wheeled) Transfers: Sit to/from Stand Sit to Stand: Mod assist         General transfer comment: assist to initiate and come to full stand up to RW    Balance Overall balance assessment: Needs assistance Sitting-balance support: No upper extremity supported;Feet supported Sitting balance-Leahy Scale: Good Sitting balance - Comments: steady sitting reaching within BOS   Standing balance support: Bilateral upper extremity supported Standing balance-Leahy Scale: Poor Standing balance comment: pt requiring B UE support on RW for static standing balance with vc's for upright posture                           ADL either performed or assessed with clinical judgement   ADL Overall ADL's : Needs assistance/impaired Eating/Feeding: Set up;Minimal assistance;Sitting Eating/Feeding Details (indicate cue type and reason): pt with weakness in BUEs and hands and ed in adaptive utensils for self feeding and given AD catalog and reviewd with son [name] as well. Grooming: Wash/dry hands;Wash/dry face;Oral care;Brushing hair;Set up;Independent;Sitting   Upper Body Bathing: Set up;Minimal assistance   Lower Body Bathing: Moderate assistance;Set up   Upper Body  Dressing : Independent;Set up   Lower Body Dressing: Moderate assistance;Set up Lower Body Dressing Details (indicate cue type and reason): mainly for SOB and wheezing with L hip pain               General ADL Comments: Pt was fatigued after PT session and able to complete ADL assessment but no functional mobility due to  fatigue and increased wheezing but O2 sats 90-96% and BP 118/58 and pulse 72 while on O2 2.5L.     Vision Patient Visual Report: No change from baseline       Perception     Praxis      Pertinent Vitals/Pain Pain Assessment: Faces Pain Score: 0-No pain(0/10 at rest; 5/10 with WB'ing L LE) Faces Pain Scale: Hurts a little bit Pain Location: L hip Pain Descriptors / Indicators: Sore;Discomfort Pain Intervention(s): Limited activity within patient's tolerance;Monitored during session;Repositioned     Hand Dominance Right   Extremity/Trunk Assessment Upper Extremity Assessment Upper Extremity Assessment: Generalized weakness   Lower Extremity Assessment Lower Extremity Assessment: Defer to PT evaluation   Cervical / Trunk Assessment Cervical / Trunk Assessment: Normal   Communication Communication Communication: HOH   Cognition Arousal/Alertness: Awake/alert Behavior During Therapy: WFL for tasks assessed/performed Overall Cognitive Status: Within Functional Limits for tasks assessed                                     General Comments  L hip/thigh bruising noted    Exercises     Shoulder Instructions      Home Living Family/patient expects to be discharged to:: Private residence Living Arrangements: Alone Available Help at Discharge: Family;Available PRN/intermittently Type of Home: House Home Access: Level entry     Home Layout: One level     Bathroom Shower/Tub: Teacher, early years/pre: Standard     Home Equipment: Environmental consultant - 4 wheels;Cane - single point;Grab bars - tub/shower;Grab bars - toilet;Tub bench   Additional Comments: Family lives nearby and check in on pt daily.      Prior Functioning/Environment Level of Independence: Independent with assistive device(s)        Comments: Pt modified independent ambulating with rollator but most recently (since recent fall) pt has been transferring to rollator (sitting on seat)  and using LE's to push self around home sitting on rollator.  Discharged home from ED with manual w/c.  Family brings meals at night but pt able to make own meals.  Uses 2 L home O2 mostly at night.        OT Problem List: Cardiopulmonary status limiting activity;Decreased strength;Decreased range of motion;Decreased activity tolerance;Impaired balance (sitting and/or standing);Pain;Decreased safety awareness;Decreased knowledge of use of DME or AE      OT Treatment/Interventions: Self-care/ADL training;Energy conservation;DME and/or AE instruction;Balance training;Patient/family education    OT Goals(Current goals can be found in the care plan section) Acute Rehab OT Goals Patient Stated Goal: to regain my independence because I live at home alone OT Goal Formulation: With patient/family Time For Goal Achievement: 08/29/19 Potential to Achieve Goals: Good ADL Goals Pt Will Perform Eating: with set-up;with min guard assist;with adaptive utensils;sitting Pt Will Perform Lower Body Dressing: with set-up;with min assist;sit to/from stand Pt Will Transfer to Toilet: with set-up;with min guard assist;regular height toilet  OT Frequency: Min 2X/week   Barriers to D/C:            Co-evaluation  AM-PAC OT "6 Clicks" Daily Activity     Outcome Measure Help from another person eating meals?: A Little Help from another person taking care of personal grooming?: None Help from another person toileting, which includes using toliet, bedpan, or urinal?: A Lot Help from another person bathing (including washing, rinsing, drying)?: A Lot Help from another person to put on and taking off regular upper body clothing?: None Help from another person to put on and taking off regular lower body clothing?: A Lot 6 Click Score: 17   End of Session    Activity Tolerance: Patient limited by fatigue Patient left: in chair;with call bell/phone within reach;with chair alarm set  OT Visit  Diagnosis: Unsteadiness on feet (R26.81);History of falling (Z91.81);Pain;Muscle weakness (generalized) (M62.81) Pain - Right/Left: Left Pain - part of body: Hip                Time: [id number] OT Time Calculation (min): 38 min Charges:  OT General Charges $OT Visit: 1 Visit OT Evaluation $OT Eval Low Complexity: 1 Low OT Treatments $Self Care/Home Management : 23-37 mins  Chrys Racer, OTR/L, [place] ascom [phone] 08/15/19, 3:51 PM

## 2019-08-15 NOTE — Evaluation (Signed)
Physical Therapy Evaluation Patient Details Name: Charles [name] MRN: [id number] DOB: [birth year] Today's Date: 08/15/2019   History of Present Illness  Pt is an 85 y.o. male presenting to hospital 11/9 with B LE swelling, abdominal swelling, and increased SOB.  Of note, pt with recent fall with hip pain and in ED 11/6 (imaging negative for acute finding).  Pt admitted with diastolic CHF.  PMH includes atrial flutter, CHF, compression fx L1, COPD, htn, pacemaker, s/p TAVR, CKD, CABG.  Clinical Impression  Prior to hospital admission and recent ED visit (s/p fall), pt was modified independent ambulating with rollator.  Pt has only been able to transfer since recent fall d/t L hip pain and was having difficulty with functional mobility (pt reports no pain at rest but 5/10 L hip pain with L LE [place] activities during session).  Pt lives alone in 1 level home with 0 steps to enter; family checks on pt daily; pt uses 2 L home O2 mostly at night (sometimes during day).  Currently pt is min assist semi-supine to sit; mod assist to stand from bed up to RW; and min to mod assist to ambulate a few feet bed to recliner with RW.  Overall pt demonstrating generalized weakness, decreased activity tolerance, L hip pain with [place] activities, and decreased functional mobility since recent fall.  Pt would benefit from skilled PT to address noted impairments and functional limitations (see below for any additional details).  Upon hospital discharge, recommend pt discharge to [place].    Follow Up Recommendations SNF    Equipment Recommendations  Rolling walker with 5" wheels;3in1 (PT);Wheelchair (measurements PT);Wheelchair cushion (measurements PT)    Recommendations for Other Services OT consult     Precautions / Restrictions Precautions Precautions: Fall Restrictions Weight Bearing Restrictions: No      Mobility  Bed Mobility Overal bed mobility: Needs Assistance Bed Mobility: Supine to Sit      Supine to sit: Min assist;HOB elevated     General bed mobility comments: assist for trunk; increased effort to perform; use of bed rails  Transfers Overall transfer level: Needs assistance Equipment used: Rolling walker (2 wheeled) Transfers: Sit to/from Stand Sit to Stand: Mod assist         General transfer comment: assist to initiate and come to full stand up to RW  Ambulation/Gait Ambulation/Gait assistance: Min assist;Mod assist Gait Distance (Feet): 3 Feet(bed to recliner) Assistive device: Rolling walker (2 wheeled)       General Gait Details: decreased stance time L LE; antalgic; vc's for walker use and to line up to chair prior to sitting  Stairs            Wheelchair Mobility    Modified Rankin (Stroke Patients Only)       Balance Overall balance assessment: Needs assistance Sitting-balance support: No upper extremity supported;Feet supported Sitting balance-Leahy Scale: Good Sitting balance - Comments: steady sitting reaching within BOS   Standing balance support: Bilateral upper extremity supported Standing balance-Leahy Scale: Poor Standing balance comment: pt requiring B UE support on RW for static standing balance with vc's for upright posture                             Pertinent Vitals/Pain Pain Assessment: 0-10 Pain Score: 0-No pain(0/10 at rest; 5/10 with WB'ing L LE) Pain Location: L hip Pain Descriptors / Indicators: Sore;Discomfort Pain Intervention(s): Limited activity within patient's tolerance;Monitored during session;Repositioned  Vitals (HR and  O2 on 2.5 L via nasal cannula) stable and WFL throughout treatment session.    Home Living Family/patient expects to be discharged to:: Private residence Living Arrangements: Alone Available Help at Discharge: Family;Available PRN/intermittently Type of Home: House Home Access: Level entry     Home Layout: One level Home Equipment: Walker - 4 wheels;Cane - single  point;Grab bars - tub/shower;Grab bars - toilet;Tub bench Additional Comments: Family lives nearby and check in on pt daily.    Prior Function Level of Independence: Independent with assistive device(s)         Comments: Pt modified independent ambulating with rollator but most recently (since recent fall) pt has been transferring to rollator (sitting on seat) and using LE's to push self around home sitting on rollator.  Discharged home from ED with manual w/c.  Family brings meals at night but pt able to make own meals.  Uses 2 L home O2 mostly at night.     Hand Dominance        Extremity/Trunk Assessment   Upper Extremity Assessment Upper Extremity Assessment: Generalized weakness    Lower Extremity Assessment Lower Extremity Assessment: Generalized weakness    Cervical / Trunk Assessment Cervical / Trunk Assessment: Normal  Communication   Communication: HOH  Cognition Arousal/Alertness: Awake/alert Behavior During Therapy: WFL for tasks assessed/performed Overall Cognitive Status: Within Functional Limits for tasks assessed                                        General Comments General comments (skin integrity, edema, etc.): L hip/thigh bruising noted.  Nursing cleared pt for participation in physical therapy.  Pt agreeable to PT session.  Pt's son present during session.    Exercises  Transfer training   Assessment/Plan    PT Assessment Patient needs continued PT services  PT Problem List Decreased strength;Decreased activity tolerance;Decreased balance;Decreased mobility;Decreased knowledge of use of DME;Cardiopulmonary status limiting activity;Pain       PT Treatment Interventions DME instruction;Gait training;Functional mobility training;Therapeutic activities;Therapeutic exercise;Balance training;Patient/family education    PT Goals (Current goals can be found in the Care Plan section)  Acute Rehab PT Goals Patient Stated Goal: to  improve mobility and overall strength PT Goal Formulation: With patient/family Time For Goal Achievement: 08/29/19 Potential to Achieve Goals: Good    Frequency Min 2X/week   Barriers to discharge Decreased caregiver support      Co-evaluation               AM-PAC PT "6 Clicks" Mobility  Outcome Measure Help needed turning from your back to your side while in a flat bed without using bedrails?: A Little Help needed moving from lying on your back to sitting on the side of a flat bed without using bedrails?: A Little Help needed moving to and from a bed to a chair (including a wheelchair)?: A Lot Help needed standing up from a chair using your arms (e.g., wheelchair or bedside chair)?: A Lot Help needed to walk in hospital room?: Total Help needed climbing 3-5 steps with a railing? : Total 6 Click Score: 12    End of Session Equipment Utilized During Treatment: Gait belt Activity Tolerance: Patient limited by fatigue Patient left: in chair;with call bell/phone within reach;with chair alarm set;with family/visitor present Nurse Communication: Mobility status;Precautions PT Visit Diagnosis: Other abnormalities of gait and mobility (R26.89);Muscle weakness (generalized) (M62.81);History of falling (Z91.81);Difficulty in walking,  not elsewhere classified (R26.2);Pain Pain - Right/Left: Left Pain - part of body: Hip    Time: 1415-1444 PT Time Calculation (min) (ACUTE ONLY): 29 min   Charges:   PT Evaluation $PT Eval Low Complexity: 1 Low PT Treatments $Therapeutic Activity: 8-22 mins        Leitha Bleak, PT 08/15/19, 3:12 PM [phone]

## 2019-08-15 NOTE — Progress Notes (Signed)
Progress Note  Patient Name: Charles [name] Date of Encounter: 08/15/2019  Primary Cardiologist: [name], MD   Subjective   Patient seen today, feels okay, edema has improved significantly.  Inpatient Medications    Scheduled Meds: . amiodarone  200 mg Oral Daily  . apixaban  2.5 mg Oral BID  . atorvastatin  20 mg Oral Daily  . citalopram  20 mg Oral Daily  . finasteride  5 mg Oral Daily  . furosemide  80 mg Intravenous Q12H  . insulin aspart  0-5 Units Subcutaneous QHS  . insulin aspart  0-9 Units Subcutaneous TID WC  . insulin glargine  15 Units Subcutaneous BID  . lisinopril  40 mg Oral Daily  . mouth rinse  15 mL Mouth Rinse BID  . pantoprazole  40 mg Oral Daily  . sodium chloride flush  3 mL Intravenous Q12H  . sodium chloride flush  3 mL Intravenous Q12H  . tamsulosin  0.4 mg Oral QPC supper  . traZODone  50 mg Oral QHS  . vitamin C  500 mg Oral Daily   Continuous Infusions: . sodium chloride     PRN Meds: sodium chloride, acetaminophen, acetaminophen, albuterol, ondansetron (ZOFRAN) IV, sodium chloride flush   Vital Signs    Vitals:   08/14/19 1713 08/14/19 2006 08/15/19 0512 08/15/19 0753  BP: (!) 144/65 122/63 (!) 101/59 (!) 112/58  Pulse: 84 76 72 73  Resp: 18 20 20 18   Temp:  97.9 F (36.6 C) (!) 97.4 F (36.3 C) 98.2 F (36.8 C)  TempSrc:  Oral Oral Oral  SpO2: 95% 97% 95% 97%  Weight:   106.1 kg   Height:        Intake/Output Summary (Last 24 hours) at 08/15/2019 1049 Last data filed at 08/15/2019 1031 Gross per 24 hour  Intake 240 ml  Output 2600 ml  Net -2360 ml   Last 3 Weights 08/15/2019 08/14/2019 08/13/2019  Weight (lbs) 234 lb 232 lb 14.4 oz 229 lb 9.6 oz  Weight (kg) 106.142 kg 105.643 kg 104.146 kg      Telemetry    A sensed V paced rhythm- Personally Reviewed   Physical Exam   GEN: No acute distress.   Neck: No JVD Cardiac: RRR, no murmurs, rubs, or gallops.  Respiratory: Clear to auscultation bilaterally.  GI: Soft, nontender, non-distended  MS:  Trace edema; No deformity. Neuro:  Nonfocal  Psych: Normal affect   Labs    High Sensitivity Troponin:   Recent Labs  Lab 08/10/19 1025 08/13/19 1232 08/13/19 1531  TROPONINIHS 53* 49* 47*      Chemistry Recent Labs  Lab 08/10/19 1025 08/13/19 1232 08/14/19 0539 08/15/19 0432  NA 136 138 142 139  K 4.5 4.7 4.2 3.9  CL 95* 98 101 98  CO2 29 31 33* 32  GLUCOSE 295* 251* 81 103*  BUN 57* 42* 39* 36*  CREATININE 2.00* 1.78* 1.76* 1.57*  CALCIUM 9.1 9.0 9.0 8.9  PROT 7.3 7.2  --   --   ALBUMIN 3.8 3.7  --   --   AST 19 18  --   --   ALT 15 16  --   --   ALKPHOS 79 81  --   --   BILITOT 0.9 0.9  --   --   GFRNONAA 30* 34* 35* 40*  GFRAA 34* 40* 40* 46*  ANIONGAP 12 9 8 9      Hematology Recent Labs  Lab 08/10/19 1025 08/13/19  1232 08/14/19 0539  WBC 5.6 5.8 6.1  RBC 3.29* 3.34* 3.18*  HGB 9.7* 9.9* 9.4*  HCT 31.2* 31.4* 29.3*  MCV 94.8 94.0 92.1  MCH 29.5 29.6 29.6  MCHC 31.1 31.5 32.1  RDW 13.2 13.3 13.1  PLT 159 168 157    BNP Recent Labs  Lab 08/13/19 1235  BNP 244.0*     DDimer No results for input(s): DDIMER in the last 168 hours.   Radiology    Dg Chest 2 View  Result Date: 08/13/2019 CLINICAL DATA:  Shortness of breath. EXAM: CHEST - 2 VIEW COMPARISON:  August 10, 2019. FINDINGS: Stable cardiomediastinal silhouette. Atherosclerosis of thoracic aorta is noted. Left-sided pacemaker is unchanged in position. Status post coronary bypass graft and transcatheter aortic valve repair. No pneumothorax or pleural effusion is noted. Right lung is clear. Stable elevated left hemidiaphragm is noted with mild left basilar atelectasis. IMPRESSION: Stable elevated left hemidiaphragm with mild left basilar subsegmental atelectasis. Aortic Atherosclerosis (ICD10-I70.0). Electronically Signed   By: Marijo Conception M.D.   On: 08/13/2019 13:30    Cardiac Studies   Echo 08/14/2019  1. Left ventricular ejection  fraction, by visual estimation, is 60 to 65%. The left ventricle has normal function. There is mildly increased left ventricular hypertrophy.  2. Elevated left atrial pressure.  3. Left ventricular diastolic parameters are consistent with Grade II diastolic dysfunction (pseudonormalization).  4. Global right ventricle has mildly reduced systolic function.The right ventricular size is normal. Mildly increased right ventricular wall thickness.  5. Pulmonary artery systolic pressure is moderately elevated (40-45 mmHg plus central venous pressure).  6. Left atrial size was mildly dilated.  7. Right atrial size was mildly dilated.  8. Moderate mitral annular calcification.  9. The mitral valve is degenerative. No evidence of mitral valve regurgitation. 10. There is mildly to moderately elevated transmitral gradient (mean gradient 6 mmHg). 11. The tricuspid valve is grossly normal. Tricuspid valve regurgitation is trivial. 12. Aortic valve regurgitation is not visualized. 13. The pulmonic valve was not well visualized. Pulmonic valve regurgitation is not visualized. 14. A pacer wire is visualized.  Patient Profile     85 y.o. male with history of heart failure preserved ejection fraction, CAD status post CABG x4 in 2004, aortic stenosis status post TAVR in March 2020, pacemaker placement, atrial flutter status post cardioversion, hypertension, former smoker presenting with shortness of breath and lower extremity edema.  Assessment & Plan    Patient overall feels better.  His edema has improved.  He is net -2.8 L over the past 24 hours.  Creatinine is improved to 1.5 with diuresing.  Heart failure preserved ejection fraction -Continue diuresing with IV Lasix 80 mg twice daily Monitor creatinine with diuresing.  History of atrial flutter. Currently a sensed V paced rhythm. Continue Eliquis 2.5 twice daily, amiodarone 200 mg daily.  Bilateral renal artery stenosis -Lisinopril restarted at 40  mg daily. Monitor creatinine with reintroduction of ACE inhibitor. -Continue Lipitor      Signed, Kate Sable, MD  08/15/2019, 10:49 AM

## 2019-08-15 NOTE — Progress Notes (Signed)
Inpatient Diabetes Program Recommendations  AACE/ADA: New Consensus Statement on Inpatient Glycemic Control (2015)  Target Ranges:  Prepandial:   less than 140 mg/dL      Peak postprandial:   less than 180 mg/dL (1-2 hours)      Critically ill patients:  140 - 180 mg/dL   Lab Results  Component Value Date   GLUCAP 67 (L) 08/15/2019   HGBA1C 11.0 (H) 02/23/2019   Results for ZHEN, AUGUSTA (MRN [id number]) as of 08/15/2019 08:20  Ref. Range 08/14/2019 21:12 08/15/2019 07:54  Glucose-Capillary Latest Ref Range: 70 - 99 mg/dL 355 (H) 67 (L)   Review of Glycemic Control  Diabetes history: DM 2 Outpatient Diabetes medications: Lantus 25 units bid Current orders for Inpatient glycemic control: Lantus 20 units bid Novolog 0-9 units tid + hs  Inpatient Diabetes Program Recommendations:    Hypoglycemia 67 this am.  Consider decreasing Lantus to 18 units bid.  Thanks,  Tama Headings RN, MSN, BC-ADM Inpatient Diabetes Coordinator Team Pager [phone] (8a-5p)

## 2019-08-15 NOTE — TOC Progression Note (Addendum)
Transition of Care [facility]) - Progression Note    Patient Details  Name: Charles [name] MRN: [id number] Date of Birth: March 09, 1932  Transition of Care [facility]) CM/SW [place], LCSW Phone Number: 08/15/2019, 4:00 PM  Clinical Narrative: CSW met with patient to discuss SNF recommendation. He said he prefers to return home with home health. CSW left voicemail for his daughter-in-law, [name] to discuss.  4:24 pm: Received call back from daughter-in-law and provided update. They will speak with patient tonight/tomorrow morning about his need for SNF placement. Told her how to acces CMS scores for local facilities online.  Expected Discharge Plan: Skilled Nursing Facility Barriers to Discharge: Continued Medical Work up  Expected Discharge Plan and Services Expected Discharge Plan: [place] arrangements for the past 2 months: Single Family Home                                       Social Determinants of Health (SDOH) Interventions    Readmission Risk Interventions Readmission Risk Prevention Plan 08/14/2019  Transportation Screening Complete  HRI or Home Care Consult Complete  Social Work Consult for [place] Planning/Counseling Complete  Palliative Care Screening Complete  Medication Review Press photographer) Complete  Some recent data might be hidden

## 2019-08-16 ENCOUNTER — Encounter: Payer: Medicare Other | Admitting: *Deleted

## 2019-08-16 DIAGNOSIS — I739 Peripheral vascular disease, unspecified: Secondary | ICD-10-CM

## 2019-08-16 DIAGNOSIS — E118 Type 2 diabetes mellitus with unspecified complications: Secondary | ICD-10-CM

## 2019-08-16 DIAGNOSIS — N182 Chronic kidney disease, stage 2 (mild): Secondary | ICD-10-CM

## 2019-08-16 DIAGNOSIS — J811 Chronic pulmonary edema: Secondary | ICD-10-CM

## 2019-08-16 DIAGNOSIS — I2510 Atherosclerotic heart disease of native coronary artery without angina pectoris: Secondary | ICD-10-CM

## 2019-08-16 DIAGNOSIS — E1165 Type 2 diabetes mellitus with hyperglycemia: Secondary | ICD-10-CM

## 2019-08-16 LAB — GLUCOSE, CAPILLARY
Glucose-Capillary: 118 mg/dL — ABNORMAL HIGH (ref 70–99)
Glucose-Capillary: 294 mg/dL — ABNORMAL HIGH (ref 70–99)
Glucose-Capillary: 277 mg/dL — ABNORMAL HIGH (ref 70–99)
Glucose-Capillary: 245 mg/dL — ABNORMAL HIGH (ref 70–99)

## 2019-08-16 LAB — BASIC METABOLIC PANEL
Anion gap: 7 (ref 5–15)
BUN: 37 mg/dL — ABNORMAL HIGH (ref 8–23)
CO2: 33 mmol/L — ABNORMAL HIGH (ref 22–32)
Calcium: 8.8 mg/dL — ABNORMAL LOW (ref 8.9–10.3)
Chloride: 98 mmol/L (ref 98–111)
Creatinine, Ser: 1.59 mg/dL — ABNORMAL HIGH (ref 0.61–1.24)
GFR calc Af Amer: 46 mL/min — ABNORMAL LOW (ref >60)
GFR calc non Af Amer: 39 mL/min — ABNORMAL LOW (ref >60)
Glucose, Bld: 124 mg/dL — ABNORMAL HIGH (ref 70–99)
Potassium: 4.1 mmol/L (ref 3.5–5.1)
Sodium: 138 mmol/L (ref 135–145)

## 2019-08-16 LAB — CBC
HCT: 30 % — ABNORMAL LOW (ref 39.0–52.0)
Hemoglobin: 9.3 g/dL — ABNORMAL LOW (ref 13.0–17.0)
MCH: 29.6 pg (ref 26.0–34.0)
MCHC: 31 g/dL (ref 30.0–36.0)
MCV: 95.5 fL (ref 80.0–100.0)
Platelets: 152 10*3/uL (ref 150–400)
RBC: 3.14 MIL/uL — ABNORMAL LOW (ref 4.22–5.81)
RDW: 13.2 % (ref 11.5–15.5)
WBC: 6.1 10*3/uL (ref 4.0–10.5)
nRBC: 0 % (ref 0.0–0.2)

## 2019-08-16 MED ORDER — INSULIN GLARGINE 100 UNIT/ML ~~LOC~~ SOLN
18.0000 [IU] | Freq: Two times a day (BID) | SUBCUTANEOUS | Status: DC
  Administered 2019-08-16 – 2019-08-17 (×3): 18 [IU] via SUBCUTANEOUS
  Filled 2019-08-16 (×5): qty 0.18

## 2019-08-16 MED ORDER — LISINOPRIL 10 MG PO TABS
10.0000 mg | ORAL_TABLET | Freq: Every day | ORAL | Status: DC
  Administered 2019-08-16 – 2019-08-20 (×3): 10 mg via ORAL
  Filled 2019-08-16 (×4): qty 1

## 2019-08-16 NOTE — Care Management Important Message (Signed)
Important Message  Patient Details  Name: LAURANCE PLUIM MRN: [id number] Date of Birth: 1932/02/01   Medicare Important Message Given:  Yes     Dannette Barbara 08/16/2019, 1:36 PM

## 2019-08-16 NOTE — Progress Notes (Addendum)
This morning patients blood pressure was 111/49 and he had scheduled lisinopril I did not give it, messaged Dr. Rockey Situ and let him know about same, order to give dose this evening and start over in the morning. I did however ask Dr. Grandville Silos about the Amiodarone and he told me to go ahead and give that this morning.

## 2019-08-16 NOTE — TOC Progression Note (Addendum)
Transition of Care [facility]) - Progression Note    Patient Details  Name: Charles [name] MRN: [id number] Date of Birth: 04-29-1932  Transition of Care [facility]) CM/SW [place], LCSW Phone Number: 08/16/2019, 10:13 AM  Clinical Narrative: Met with patient and son at bedside. Patient now agreeable to SNF. Son will review CMS scores. CSW has sent out referral to see what his options are.     3:26 pm: Only bed offer so far is [facility]. Everyone else hasn't answered so far. Left voicemail for son to let him know and see if he had a top preference.  Expected Discharge Plan: Skilled Nursing Facility Barriers to Discharge: Continued Medical Work up  Expected Discharge Plan and Services Expected Discharge Plan: [place] arrangements for the past 2 months: Single Family Home                                       Social Determinants of Health (SDOH) Interventions    Readmission Risk Interventions Readmission Risk Prevention Plan 08/14/2019  Transportation Screening Complete  HRI or Home Care Consult Complete  Social Work Consult for [place] Planning/Counseling Complete  Palliative Care Screening Complete  Medication Review Press photographer) Complete  Some recent data might be hidden

## 2019-08-16 NOTE — Progress Notes (Signed)
[place], PA-C put in order to check ReDs vest results and they were 34%. Did make Dr. Rockey Situ aware of same.

## 2019-08-16 NOTE — Progress Notes (Addendum)
Progress Note  Patient Name: Charles [name] Date of Encounter: 08/16/2019  Primary Cardiologist: [name]  Subjective   Dyspnea improving. No chest pain. Documented UOP of 1 L for the past 24 hours with a net - 4.4 L for the admission. Weight inaccurate in the setting of recent mechanical fall with hip pain preventing him from standing. Renal function stable. Potassium at goal. HGB low, though stable.   Inpatient Medications    Scheduled Meds: . amiodarone  200 mg Oral Daily  . apixaban  2.5 mg Oral BID  . atorvastatin  20 mg Oral Daily  . citalopram  20 mg Oral Daily  . finasteride  5 mg Oral Daily  . furosemide  80 mg Intravenous Q12H  . insulin aspart  0-5 Units Subcutaneous QHS  . insulin aspart  0-9 Units Subcutaneous TID WC  . insulin glargine  15 Units Subcutaneous BID  . lisinopril  40 mg Oral Daily  . mouth rinse  15 mL Mouth Rinse BID  . pantoprazole  40 mg Oral Daily  . sodium chloride flush  3 mL Intravenous Q12H  . sodium chloride flush  3 mL Intravenous Q12H  . tamsulosin  0.4 mg Oral QPC supper  . traZODone  50 mg Oral QHS  . vitamin C  500 mg Oral Daily   Continuous Infusions: . sodium chloride     PRN Meds: sodium chloride, acetaminophen, acetaminophen, albuterol, ondansetron (ZOFRAN) IV, sodium chloride flush   Vital Signs    Vitals:   08/15/19 1940 08/15/19 2110 08/16/19 0338 08/16/19 0833  BP: (!) 117/58 123/60 (!) 98/51 (!) 111/49  Pulse: 73 68 77 73  Resp:    19  Temp: 98.6 F (37 C) 98.3 F (36.8 C) 98.4 F (36.9 C) 98.3 F (36.8 C)  TempSrc:    Oral  SpO2: 97% 95% 98% 98%  Weight:  103.4 kg    Height:        Intake/Output Summary (Last 24 hours) at 08/16/2019 1018 Last data filed at 08/16/2019 [id number] Gross per 24 hour  Intake -  Output 1250 ml  Net -1250 ml   Filed Weights   08/14/19 0421 08/15/19 0512 08/15/19 2110  Weight: 105.6 kg 106.1 kg 103.4 kg    Telemetry    V-paced - Personally Reviewed  ECG    No new  tracings - Personally Reviewed  Physical Exam   GEN: No acute distress.   Neck: JVD elevated ~ 10 cm. Cardiac: RRR, no murmurs, rubs, or gallops.  Respiratory: Diminished breath sounds bilaterally with expiratory wheezing. GI: Soft, nontender, mildly distended.   MS: Trace bilateral pretibial edema; No deformity. Neuro:  Alert and oriented x 3; Nonfocal.  Psych: Normal affect.  Labs    Chemistry Recent Labs  Lab 08/10/19 1025 08/13/19 1232 08/14/19 0539 08/15/19 0432 08/16/19 0514  NA 136 138 142 139 138  K 4.5 4.7 4.2 3.9 4.1  CL 95* 98 101 98 98  CO2 29 31 33* 32 33*  GLUCOSE 295* 251* 81 103* 124*  BUN 57* 42* 39* 36* 37*  CREATININE 2.00* 1.78* 1.76* 1.57* 1.59*  CALCIUM 9.1 9.0 9.0 8.9 8.8*  PROT 7.3 7.2  --   --   --   ALBUMIN 3.8 3.7  --   --   --   AST 19 18  --   --   --   ALT 15 16  --   --   --   ALKPHOS 79 81  --   --   --  BILITOT 0.9 0.9  --   --   --   GFRNONAA 30* 34* 35* 40* 39*  GFRAA 34* 40* 40* 46* 46*  ANIONGAP 12 9 8 9 7      Hematology Recent Labs  Lab 08/13/19 1232 08/14/19 0539 08/16/19 0514  WBC 5.8 6.1 6.1  RBC 3.34* 3.18* 3.14*  HGB 9.9* 9.4* 9.3*  HCT 31.4* 29.3* 30.0*  MCV 94.0 92.1 95.5  MCH 29.6 29.6 29.6  MCHC 31.5 32.1 31.0  RDW 13.3 13.1 13.2  PLT 168 157 152    Cardiac EnzymesNo results for input(s): TROPONINI in the last 168 hours. No results for input(s): TROPIPOC in the last 168 hours.   BNP Recent Labs  Lab 08/13/19 1235  BNP 244.0*     DDimer No results for input(s): DDIMER in the last 168 hours.   Radiology    No results found.  Cardiac Studies   2D Echo 08/14/2019: 1. Left ventricular ejection fraction, by visual estimation, is 60 to 65%. The left ventricle has normal function. There is mildly increased left ventricular hypertrophy.  2. Elevated left atrial pressure.  3. Left ventricular diastolic parameters are consistent with Grade II diastolic dysfunction (pseudonormalization).  4. Global  right ventricle has mildly reduced systolic function.The right ventricular size is normal. Mildly increased right ventricular wall thickness.  5. Pulmonary artery systolic pressure is moderately elevated (40-45 mmHg plus central venous pressure).  6. Left atrial size was mildly dilated.  7. Right atrial size was mildly dilated.  8. Moderate mitral annular calcification.  9. The mitral valve is degenerative. No evidence of mitral valve regurgitation. 10. There is mildly to moderately elevated transmitral gradient (mean gradient 6 mmHg). 11. The tricuspid valve is grossly normal. Tricuspid valve regurgitation is trivial. 12. Aortic valve regurgitation is not visualized. 13. The pulmonic valve was not well visualized. Pulmonic valve regurgitation is not visualized. 14. A pacer wire is visualized.  Patient Profile     85 y.o. 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   Assessment & Plan    1. Acute on chronic HFpEF: -He remains mildly volume up -Drinking large amounts of liquids at home (2L of Pepsi every 3 days, 2.5 gallons of milk weekly) -Continue IV diuresis with Lasix 80 mg bid -Not on spironolactone secondary to CKD -CHF education -Stand for weights when able -ReDs vest  2. CAD s/p CABG: -No chest pain -On Eliquis in place of ASA -Lipitor   3. Atrial flutter: -V-paced rhythm on tele -Remains on Eliquis 2.5 mg bid (age and renal function) and amiodarone  -Recent LFT and TSH normal  4. History of CHB s/p PPM: -Device appears to be  functioning normally on tele  5. Aortic valve disease s/p TAVR: -Echo as above without significant peri-valvular leak  6. Chronic hypoxic respiratory failure/COPD: -Stable -Nebs prn  7. Anemia: -Stable  8. HTN with RAS: -Restarted on lisinopril  -Monitor renal function -Follow up with primary cardiologist as outpatient    For questions or updates, please contact [place] Please consult [email or web address] for contact info under Cardiology/STEMI.  Signed, Christell Faith, PA-C [facility] Pager: [phone] 08/16/2019, 10:18 AM

## 2019-08-16 NOTE — NC FL2 (Signed)
Echo LEVEL OF CARE SCREENING TOOL     IDENTIFICATION  Patient Name: Charles [name] Birthdate: 05-22-1932 Sex: male Admission Date (Current Location): 08/13/2019  [place] and [place] Number:  Engineering geologist and Address:  [facility], [address] [place], [place] [address]      Provider Number: [id number]  Attending Physician Name and Address:  Eugenie Filler, MD  Relative Name and Phone Number:       Current Level of Care: Hospital Recommended Level of Care: [place] Prior Approval Number:    Date Approved/Denied:   PASRR Number: [id number]  Discharge Plan: SNF    Current Diagnoses: Patient Active Problem List   Diagnosis Date Noted  . Acute on chronic heart failure with preserved ejection fraction (HFpEF) ([place]) 08/13/2019  . Chronic respiratory failure ([place]) 07/19/2019  . AKI (acute kidney injury) ([place]) 05/25/2019  . CKD (chronic kidney disease), stage II 12/27/2018  . S/P TAVR (transcatheter aortic valve replacement)   . 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 Paroxysmal atrial flutter ([place])   . Scrotal swelling 02/16/2018  . Coronary artery disease involving native coronary artery of native heart without angina pectoris   . Acute on chronic diastolic CHF (congestive heart failure) ([place]) 09/06/2017  . Urinary urgency 07/01/2017  . Chronic anticoagulation 11/05/2016  . Chronic radicular pain of lower back 02/17/2016  . Aortic stenosis, severe 06/04/2014  . DDD (degenerative disc disease), lumbar 10/02/2013  . Ex-smoker   . Carotid arterial disease ([place]) 08/23/2013  . MDD (major depressive disorder), recurrent episode, moderate ([place]) 09/21/2007  . OSTEOARTHRITIS 09/21/2007  . COLONIC POLYPS 01/31/2007  . Diabetes mellitus type 2, uncontrolled, with complications ([place]) [id number]  . HYPERCHOLESTEROLEMIA 01/31/2007  . ERECTILE DYSFUNCTION 01/31/2007  . Essential  hypertension 01/31/2007  . GERD 01/31/2007  . PSORIASIS 01/31/2007    Orientation RESPIRATION BLADDER Height & Weight     Self, Time, Situation, Place  O2(Nasal Canula 3 L) Incontinent, External catheter Weight: 228 lb (103.4 kg) Height:  6\' 1"  (185.4 cm)  BEHAVIORAL SYMPTOMS/MOOD NEUROLOGICAL BOWEL NUTRITION STATUS  (None) (None) Continent Diet(Heart healthy/carb modified.)  AMBULATORY STATUS COMMUNICATION OF NEEDS Skin   Limited Assist Verbally Skin abrasions, Bruising                       Personal Care Assistance Level of Assistance  Bathing, Feeding, Dressing Bathing Assistance: Limited assistance Feeding assistance: Limited assistance Dressing Assistance: Limited assistance     Functional Limitations Info  Sight, Hearing, Speech Sight Info: Adequate Hearing Info: Adequate Speech Info: Adequate    SPECIAL CARE FACTORS FREQUENCY  PT (By licensed PT), OT (By licensed OT)     PT Frequency: 5 x week OT Frequency: 5 x week            Contractures Contractures Info: Not present    Additional Factors Info  Code Status, Allergies, Psychotropic Code Status Info: Full code Allergies Info: Gabapentin, Metoprolol, Spironolactone, Amlodipine, Other, Rosiglitazone Maleate, Tricor (Fenofibrate) Psychotropic Info: Depression: Trazodone 50 mg PO QHS         Current Medications (08/16/2019):  This is the current hospital active medication list Current Facility-Administered Medications  Medication Dose Route Frequency Provider Last Rate Last Dose  . 0.9 %  sodium chloride infusion  250 mL Intravenous PRN Swayze, Ava, DO      . acetaminophen (TYLENOL) tablet 1,000 mg  1,000 mg Oral Q8H PRN Swayze,  Ava, DO      . acetaminophen (TYLENOL) tablet 650 mg  650 mg Oral Q4H PRN Swayze, Ava, DO   650 mg at 08/15/19 1919  . albuterol (PROVENTIL) (2.5 MG/3ML) 0.083% nebulizer solution 2.5 mg  2.5 mg Nebulization Q4H PRN Swayze, Ava, DO   2.5 mg at 08/15/19 2154  . amiodarone  (PACERONE) tablet 200 mg  200 mg Oral Daily Swayze, Ava, DO   200 mg at 08/16/19 0949  . apixaban (ELIQUIS) tablet 2.5 mg  2.5 mg Oral BID Swayze, Ava, DO   2.5 mg at 08/16/19 0950  . atorvastatin (LIPITOR) tablet 20 mg  20 mg Oral Daily Swayze, Ava, DO   20 mg at 08/15/19 2144  . citalopram (CELEXA) tablet 20 mg  20 mg Oral Daily Swayze, Ava, DO   20 mg at 08/16/19 0950  . finasteride (PROSCAR) tablet 5 mg  5 mg Oral Daily Swayze, Ava, DO   5 mg at 08/16/19 0949  . furosemide (LASIX) injection 80 mg  80 mg Intravenous Q12H Visser, Jacquelyn D, PA-C   80 mg at 08/16/19 0501  . insulin aspart (novoLOG) injection 0-5 Units  0-5 Units Subcutaneous QHS Swayze, Ava, DO   2 Units at 08/15/19 2144  . insulin aspart (novoLOG) injection 0-9 Units  0-9 Units Subcutaneous TID WC Swayze, Ava, DO   3 Units at 08/15/19 1715  . insulin glargine (LANTUS) injection 15 Units  15 Units Subcutaneous BID Eugenie Filler, MD   15 Units at 08/16/19 [phone]  . lisinopril (ZESTRIL) tablet 40 mg  40 mg Oral Daily Marrianne Mood D, PA-C   40 mg at 08/15/19 0910  . MEDLINE mouth rinse  15 mL Mouth Rinse BID Swayze, Ava, DO   15 mL at 08/15/19 2145  . ondansetron (ZOFRAN) injection 4 mg  4 mg Intravenous Q6H PRN Swayze, Ava, DO      . pantoprazole (PROTONIX) EC tablet 40 mg  40 mg Oral Daily Swayze, Ava, DO   40 mg at 08/16/19 0950  . sodium chloride flush (NS) 0.9 % injection 3 mL  3 mL Intravenous Q12H Swayze, Ava, DO   3 mL at 08/15/19 0910  . sodium chloride flush (NS) 0.9 % injection 3 mL  3 mL Intravenous PRN Swayze, Ava, DO      . sodium chloride flush (NS) 0.9 % injection 3 mL  3 mL Intravenous Q12H Swayze, Ava, DO   3 mL at 08/15/19 2145  . tamsulosin (FLOMAX) capsule 0.4 mg  0.4 mg Oral QPC supper Swayze, Ava, DO   0.4 mg at 08/15/19 1715  . traZODone (DESYREL) tablet 50 mg  50 mg Oral QHS Swayze, Ava, DO   50 mg at 08/15/19 2144  . vitamin C (ASCORBIC ACID) tablet 500 mg  500 mg Oral Daily Swayze, Ava, DO   500  mg at 08/16/19 [id number]     Discharge Medications: Please see discharge summary for a list of discharge medications.  Relevant Imaging Results:  Relevant Lab Results:   Additional Information SS#: [id number]  Candie Chroman, LCSW

## 2019-08-16 NOTE — Progress Notes (Addendum)
PROGRESS NOTE    Charles [name]  [id number] DOB: July 10, 1932 DOA: 08/13/2019 PCP: [name], MD    Brief Narrative:  The patient presents to the ED today after having been in the ED last Friday for the same complaints, although he states that they weren't as severe at that time. He was given a one time injection of lasix in the ED and he was sent home on torsemide. He states that his swelling has "doubled" since that time, despite taking an increased dose of the torsemide as directed by cardiology.  He denies fevers, chills, cough, wheezing, nausea, vomiting, chest pain, constipation or diarrhea. He fell once a week ago. He is feeling weak.   In the ED he is found to be saturating in the mid-nineties on 2 Liters which is his baseline oxygen requirement. Creatinine appears to be at about baseline at 1.78. BNP is 244 . This is increased only slightly over the last BNP which was drawn on 05/24/2019. He is afebrile, somewhat hypotensive with a systolic blood pressure of 103/54. Pulse has varied between 71 and 140 as recorded (?). His glucose is 251. EKG demonstrates a ventricularly paced rhythm.   [facility] have been consulted to admit the patient for further evaluation and care. Cardiology [facility]) has been consulted by the ED. Echocardiogram has been ordered.  Patient started on IV Lasix.  Cardiology following.    Assessment & Plan:   Principal Problem:   Acute on chronic heart failure with preserved ejection fraction (HFpEF) (HCC) Active Problems:   Diabetes mellitus type 2, uncontrolled, with complications ([place])   HYPERCHOLESTEROLEMIA   MDD (major depressive disorder), recurrent episode, moderate (HCC)   Essential hypertension   GERD   Carotid arterial disease (HCC)   Chronic radicular pain of lower back   Chronic anticoagulation   Acute on chronic diastolic CHF (congestive heart failure) (HCC)   Coronary artery disease involving native coronary artery of native  heart without angina pectoris   Paroxysmal atrial flutter (HCC)   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  Cardiac enzymes minimally elevated however seem to plateaued.  BNP was 244.  Patient denies any ongoing chest pain.  Patient then states improvement with shortness of breath and symptoms.  2D echo with a EF of 60 to 65%, mildly increased LVH, elevated left atrial pressure, grade 2 diastolic dysfunction, mildly reduced right ventricular systolic function.  Patient with a urine output of 1.250 L over the past 24 hours.  Patient is - 4.230 L during this hospitalization.  Current weight of 228 pounds from 234 pounds from 232 pounds from 229 pounds.  Creatinine seems to be plateauing.  Continue Lasix 80 mg IV every 12 hours, Lipitor, lisinopril.  Per cardiology patient to try REDS VEST today.  Due to borderline blood pressure cardiology recommending to hold off on beta-blocker for now.  Not on spironolactone secondary to chronic kidney disease.  Cardiology following and appreciate input and recommendations.  2.  Hypotension Resolved.  Monitor closely with diuresis.  3.  COPD Stable.  Continue nebs as needed.  4.  Poorly controlled diabetes mellitus type 2 Hemoglobin A1c noted to be 11.0 02/23/2019.  CBG of 67 the morning of 08/15/2019.  Hemoglobin A1c pending.  CBG of 118 this morning.  Increase Lantus to 18 units twice daily.  Continue sliding scale insulin.  Diabetes coordinator following.    5.  History of paroxysmal atrial flutter Ventricularly paced.  Continue amiodarone.  Eliquis for anticoagulation.  Per cardiology.   6.  Bilateral renal artery stenosis Patient started on an ACE inhibitor.  Monitor renal function closely.  Continue statin.  7.  Status post TAVR/status post PPM Pacemaker  interrogated per cardiology.  Small leak noted on echocardiogram from TAVR.  Per cardiology.   DVT prophylaxis: Eliquis Code Status: Full Family Communication: Updated patient.  No family at bedside. Disposition Plan: SNF when clinically improved and cleared by cardiology with palliative care following.   Consultants:   Palliative care: Asencion Gowda, NP 08/14/2019  Cardiology: Dr. Harrell Gave End 08/14/2019  Procedures:   2D echo 08/14/2019  Chest x-ray 08/13/2019  Antimicrobials:   None   Subjective: Patient denies any shortness of breath.  Patient in agreement to go to SNF.  Patient denies any chest pain.  Patient sitting up in bed eating a biscuit.   Objective: Vitals:   08/15/19 1940 08/15/19 2110 08/16/19 0338 08/16/19 0833  BP: (!) 117/58 123/60 (!) 98/51 (!) 111/49  Pulse: 73 68 77 73  Resp:    19  Temp: 98.6 F (37 C) 98.3 F (36.8 C) 98.4 F (36.9 C) 98.3 F (36.8 C)  TempSrc:    Oral  SpO2: 97% 95% 98% 98%  Weight:  103.4 kg    Height:        Intake/Output Summary (Last 24 hours) at 08/16/2019 1014 Last data filed at 08/16/2019 0339 Gross per 24 hour  Intake --  Output 1250 ml  Net -1250 ml   Filed Weights   08/14/19 0421 08/15/19 0512 08/15/19 2110  Weight: 105.6 kg 106.1 kg 103.4 kg    Examination:  General exam: NAD. Respiratory system: Scattered coarse breath sounds/crackles. Respiratory effort normal. Cardiovascular system: Regular rate rhythm no murmurs rubs or gallops.  1 to trace bilateral lower extremity edema.  Positive JVD. Gastrointestinal system: Abdomen is nondistended, soft and nontender. No organomegaly or masses felt. Normal bowel sounds heard. Central nervous system: Alert and oriented. No focal neurological deficits. Extremities: Symmetric 5 x 5 power. Skin: No rashes, lesions or ulcers Psychiatry: Judgement and insight appear normal. Mood & affect appropriate.     Data Reviewed: I have personally reviewed following  labs and imaging studies  CBC: Recent Labs  Lab 08/10/19 1025 08/13/19 1232 08/14/19 0539 08/16/19 0514  WBC 5.6 5.8 6.1 6.1  NEUTROABS 4.1 4.1  --   --   HGB 9.7* 9.9* 9.4* 9.3*  HCT 31.2* 31.4* 29.3* 30.0*  MCV 94.8 94.0 92.1 95.5  PLT 159 168 157 0000000   Basic Metabolic Panel: Recent Labs  Lab 08/10/19 1025 08/13/19 1232 08/14/19 0539 08/15/19 0432 08/16/19 0514  NA 136 138 142 139 138  K 4.5 4.7 4.2 3.9 4.1  CL 95* 98 101 98 98  CO2 29 31 33* 32 33*  GLUCOSE 295* 251* 81 103* 124*  BUN 57* 42* 39* 36* 37*  CREATININE 2.00* 1.78* 1.76* 1.57* 1.59*  CALCIUM 9.1 9.0 9.0 8.9 8.8*   GFR: Estimated Creatinine Clearance: 43.7 mL/min (A) (by C-G formula based on SCr of 1.59 mg/dL (H)). Liver Function Tests: Recent Labs  Lab 08/10/19 1025 08/13/19 1232  AST 19 18  ALT 15 16  ALKPHOS 79 81  BILITOT 0.9 0.9  PROT 7.3 7.2  ALBUMIN 3.8 3.7   No results for input(s): LIPASE, AMYLASE in the last 168  hours. No results for input(s): AMMONIA in the last 168 hours. Coagulation Profile: Recent Labs  Lab 08/10/19 1025  INR 1.1   Cardiac Enzymes: No results for input(s): CKTOTAL, CKMB, CKMBINDEX, TROPONINI in the last 168 hours. BNP (last 3 results) Recent Labs    01/26/19 0000  PROBNP 1,162*   HbA1C: No results for input(s): HGBA1C in the last 72 hours. CBG: Recent Labs  Lab 08/15/19 0828 08/15/19 1152 08/15/19 1648 08/15/19 2108 08/16/19 0834  GLUCAP 140* 196* 203* 235* 118*   Lipid Profile: No results for input(s): CHOL, HDL, LDLCALC, TRIG, CHOLHDL, LDLDIRECT in the last 72 hours. Thyroid Function Tests: No results for input(s): TSH, T4TOTAL, FREET4, T3FREE, THYROIDAB in the last 72 hours. Anemia Panel: No results for input(s): VITAMINB12, FOLATE, FERRITIN, TIBC, IRON, RETICCTPCT in the last 72 hours. Sepsis Labs: No results for input(s): PROCALCITON, LATICACIDVEN in the last 168 hours.  Recent Results (from the past 240 hour(s))  SARS CORONAVIRUS 2  (TAT 6-24 HRS) Nasopharyngeal Nasopharyngeal Swab     Status: None   Collection Time: 08/10/19 10:25 AM   Specimen: Nasopharyngeal Swab  Result Value Ref Range Status   SARS Coronavirus 2 NEGATIVE NEGATIVE Final    Comment: (NOTE) SARS-CoV-2 target nucleic acids are NOT DETECTED. The SARS-CoV-2 RNA is generally detectable in upper and lower respiratory specimens during the acute phase of infection. Negative results do not preclude SARS-CoV-2 infection, do not rule out co-infections with other pathogens, and should not be used as the sole basis for treatment or other patient management decisions. Negative results must be combined with clinical observations, patient history, and epidemiological information. The expected result is Negative. Fact Sheet for Patients: SugarRoll.be Fact Sheet for Healthcare Providers: https://www.woods-mathews.com/ This test is not yet approved or cleared by the [place] FDA and  has been authorized for detection and/or diagnosis of SARS-CoV-2 by FDA under an Emergency Use Authorization (EUA). This EUA will remain  in effect (meaning this test can be used) for the duration of the COVID-19 declaration under Section 56 4(b)(1) of the Act, 21 U.S.C. section 360bbb-3(b)(1), unless the authorization is terminated or revoked sooner. Performed at [facility], [place] [address], [place], [place] [address]   SARS CORONAVIRUS 2 (TAT 6-24 HRS) Nasopharyngeal Nasopharyngeal Swab     Status: None   Collection Time: 08/13/19  5:07 PM   Specimen: Nasopharyngeal Swab  Result Value Ref Range Status   SARS Coronavirus 2 NEGATIVE NEGATIVE Final    Comment: (NOTE) SARS-CoV-2 target nucleic acids are NOT DETECTED. The SARS-CoV-2 RNA is generally detectable in upper and lower respiratory specimens during the acute phase of infection. Negative results do not preclude SARS-CoV-2 infection, do not rule out co-infections with  other pathogens, and should not be used as the sole basis for treatment or other patient management decisions. Negative results must be combined with clinical observations, patient history, and epidemiological information. The expected result is Negative. Fact Sheet for Patients: SugarRoll.be Fact Sheet for Healthcare Providers: https://www.woods-mathews.com/ This test is not yet approved or cleared by the [place] FDA and  has been authorized for detection and/or diagnosis of SARS-CoV-2 by FDA under an Emergency Use Authorization (EUA). This EUA will remain  in effect (meaning this test can be used) for the duration of the COVID-19 declaration under Section 56 4(b)(1) of the Act, 21 U.S.C. section 360bbb-3(b)(1), unless the authorization is terminated or revoked sooner. Performed at [facility], [place] [address] [place], [place] [address]  Radiology Studies: No results found.      Scheduled Meds:  amiodarone  200 mg Oral Daily   apixaban  2.5 mg Oral BID   atorvastatin  20 mg Oral Daily   citalopram  20 mg Oral Daily   finasteride  5 mg Oral Daily   furosemide  80 mg Intravenous Q12H   insulin aspart  0-5 Units Subcutaneous QHS   insulin aspart  0-9 Units Subcutaneous TID WC   insulin glargine  15 Units Subcutaneous BID   lisinopril  40 mg Oral Daily   mouth rinse  15 mL Mouth Rinse BID   pantoprazole  40 mg Oral Daily   sodium chloride flush  3 mL Intravenous Q12H   sodium chloride flush  3 mL Intravenous Q12H   tamsulosin  0.4 mg Oral QPC supper   traZODone  50 mg Oral QHS   vitamin C  500 mg Oral Daily   Continuous Infusions:  sodium chloride       LOS: 3 days    Time spent: 35 minutes    Irine Seal, MD [facility]  If 7PM-7AM, please contact night-coverage [email or web address] 08/16/2019, 10:14 AM

## 2019-08-16 NOTE — Progress Notes (Addendum)
Daily Progress Note   Patient Name: Charles [name]       Date: 08/16/2019 DOB: 1931/10/13  Age: 85 y.o. MRN#: [id number] Attending Physician: Eugenie Filler, MD Primary Care Physician: [name], MD Admit Date: 08/13/2019  Reason for Consultation/Follow-up: Establishing goals of care  Subjective: Patient is resting in bed. He denies complaint at this time. He states he has talked with his family and he wants to go to SNF to help his mobility. Continue full code/full scope.  Length of Stay: 3  Current Medications: Scheduled Meds:  . amiodarone  200 mg Oral Daily  . apixaban  2.5 mg Oral BID  . atorvastatin  20 mg Oral Daily  . citalopram  20 mg Oral Daily  . finasteride  5 mg Oral Daily  . furosemide  80 mg Intravenous Q12H  . insulin aspart  0-5 Units Subcutaneous QHS  . insulin aspart  0-9 Units Subcutaneous TID WC  . insulin glargine  15 Units Subcutaneous BID  . lisinopril  40 mg Oral Daily  . mouth rinse  15 mL Mouth Rinse BID  . pantoprazole  40 mg Oral Daily  . sodium chloride flush  3 mL Intravenous Q12H  . sodium chloride flush  3 mL Intravenous Q12H  . tamsulosin  0.4 mg Oral QPC supper  . traZODone  50 mg Oral QHS  . vitamin C  500 mg Oral Daily    Continuous Infusions: . sodium chloride      PRN Meds: sodium chloride, acetaminophen, acetaminophen, albuterol, ondansetron (ZOFRAN) IV, sodium chloride flush  Physical Exam Pulmonary:     Effort: Pulmonary effort is normal.  Skin:    General: Skin is warm and dry.  Neurological:     Mental Status: He is alert.             Vital Signs: BP (!) 111/49 (BP Location: Left Arm)   Pulse 73   Temp 98.3 F (36.8 C) (Oral)   Resp 19   Ht 6\' 1"  (1.854 m)   Wt 103.4 kg   SpO2 98%   BMI 30.08 kg/m   SpO2: SpO2: 98 % O2 Device: O2 Device: Nasal Cannula O2 Flow Rate: O2 Flow Rate (L/min): 3 L/min  Intake/output summary:   Intake/Output Summary (Last 24 hours) at 08/16/2019 1447 Last data filed  at 08/16/2019 1300 Gross per 24 hour  Intake 240 ml  Output 650 ml  Net -410 ml   LBM: Last BM Date: 08/14/19 Baseline Weight: Weight: 102.1 kg Most recent weight: Weight: 103.4 kg       Palliative Assessment/Data: 50%      Patient Active Problem List   Diagnosis Date Noted  . Acute on chronic heart failure with preserved ejection fraction (HFpEF) ([place]) 08/13/2019  . Chronic respiratory failure ([place]) 07/19/2019  . AKI (acute kidney injury) ([place]) 05/25/2019  . CKD (chronic kidney disease), stage II 12/27/2018  . S/P TAVR (transcatheter aortic valve replacement)   . 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 Paroxysmal atrial flutter ([place])   . Scrotal swelling 02/16/2018  . Coronary artery disease involving native coronary artery of native heart without angina pectoris   . Acute on chronic diastolic CHF (congestive heart failure) ([place]) 09/06/2017  . Urinary urgency 07/01/2017  . Chronic anticoagulation 11/05/2016  . Chronic radicular pain of lower back 02/17/2016  . Aortic stenosis, severe 06/04/2014  . DDD (degenerative disc disease), lumbar 10/02/2013  . Ex-smoker   . Carotid arterial disease ([place]) 08/23/2013  . MDD (major depressive disorder), recurrent episode, moderate ([place]) 09/21/2007  . OSTEOARTHRITIS 09/21/2007  . COLONIC POLYPS 01/31/2007  . Diabetes mellitus type 2, uncontrolled, with complications ([place]) [id number]  . HYPERCHOLESTEROLEMIA 01/31/2007  . ERECTILE DYSFUNCTION 01/31/2007  . Essential hypertension 01/31/2007  . GERD 01/31/2007  . PSORIASIS 01/31/2007    Palliative Care Assessment & Plan   Recommendations/Plan:  Recommend palliative at D/C.     Code Status:    Code Status Orders  (From admission, onward)          Start     Ordered   08/13/19 1618  Full code  Continuous     08/13/19 1620        Code Status History    Date Active Date Inactive Code Status Order ID Comments User Context   05/25/2019 0222 05/25/2019 2135 Full Code [id number]  Etta Quill, DO ED   03/19/2019 2150 03/22/2019 2032 Full Code [id number]  Isaiah Serge, NP Inpatient   12/26/2018 0138 12/27/2018 1910 Full Code [id number]  Milus Banister, MD ED   12/05/2018 1739 12/07/2018 1635 Full Code [id number]  Eileen Stanford, PA-C Inpatient   04/21/2018 1344 04/22/2018 1459 Full Code [id number]  Deboraha Sprang, MD Inpatient   03/25/2018 1523 03/28/2018 [place] DNR [id number]  Radene Gunning, NP ED   03/25/2018 1503 03/25/2018 1523 Full Code [id number]  Radene Gunning, NP ED   10/20/2017 1319 10/20/2017 1931 Full Code [id number]  Burnell Blanks, MD Inpatient   09/06/2017 1756 09/09/2017 1735 Full Code [id number]  Barrett, Evelene Croon, PA-C ED   06/24/2015 1556 06/26/2015 1743 Full Code [id number]  Eileen Stanford, PA-C Inpatient   Advance Care Planning Activity    Advance Directive Documentation     Most Recent Value  Type of Advance Directive  Healthcare Power of Attorney  Pre-existing out of facility DNR order (yellow form or pink MOST form)  -  "MOST" Form in Place?  -       Prognosis:   Unable to determine  Discharge Planning:  [place] for rehab with Palliative care service follow-up  Thank you for allowing the Palliative Medicine Team to assist in the care of this patient.   Total Time 15 min Prolonged Time Billed  no  Greater than 50%  of this time was spent counseling and coordinating care related to the above assessment and plan.  Rogerick Baldwin, NP  Please contact Palliative Medicine Team phone at [phone] for questions and concerns.      

## 2019-08-17 DIAGNOSIS — J432 Centrilobular emphysema: Secondary | ICD-10-CM

## 2019-08-17 LAB — BASIC METABOLIC PANEL
Anion gap: 9 (ref 5–15)
BUN: 38 mg/dL — ABNORMAL HIGH (ref 8–23)
CO2: 32 mmol/L (ref 22–32)
Calcium: 8.7 mg/dL — ABNORMAL LOW (ref 8.9–10.3)
Chloride: 98 mmol/L (ref 98–111)
Creatinine, Ser: 1.67 mg/dL — ABNORMAL HIGH (ref 0.61–1.24)
GFR calc Af Amer: 43 mL/min — ABNORMAL LOW (ref >60)
GFR calc non Af Amer: 37 mL/min — ABNORMAL LOW (ref >60)
Glucose, Bld: 104 mg/dL — ABNORMAL HIGH (ref 70–99)
Potassium: 3.8 mmol/L (ref 3.5–5.1)
Sodium: 139 mmol/L (ref 135–145)

## 2019-08-17 LAB — GLUCOSE, CAPILLARY
Glucose-Capillary: 119 mg/dL — ABNORMAL HIGH (ref 70–99)
Glucose-Capillary: 194 mg/dL — ABNORMAL HIGH (ref 70–99)
Glucose-Capillary: 270 mg/dL — ABNORMAL HIGH (ref 70–99)
Glucose-Capillary: 285 mg/dL — ABNORMAL HIGH (ref 70–99)

## 2019-08-17 MED ORDER — BUDESONIDE 0.25 MG/2ML IN SUSP
0.2500 mg | Freq: Two times a day (BID) | RESPIRATORY_TRACT | Status: DC
  Administered 2019-08-17 – 2019-08-20 (×6): 0.25 mg via RESPIRATORY_TRACT
  Filled 2019-08-17 (×6): qty 2

## 2019-08-17 MED ORDER — IPRATROPIUM-ALBUTEROL 0.5-2.5 (3) MG/3ML IN SOLN
3.0000 mL | Freq: Three times a day (TID) | RESPIRATORY_TRACT | Status: DC
  Administered 2019-08-17 – 2019-08-20 (×8): 3 mL via RESPIRATORY_TRACT
  Filled 2019-08-17 (×9): qty 3

## 2019-08-17 NOTE — TOC Progression Note (Addendum)
Transition of Care [facility]) - Progression Note    Patient Details  Name: Charles [name] MRN: [id number] Date of Birth: 1932/08/16  Transition of Care [facility]) CM/SW [place], LCSW Phone Number: 08/17/2019, 9:31 AM  Clinical Narrative: Received message from son last night that preferences are [facility] or [facility]. [facility] has offered a bed but [facility] has not responded yet. [facility] does not take weekend admissions. Started insurance authorization in case patient discharging today or over the weekend. If plan for weekend discharge, will have to go to [facility] if they make bed offer. Last COVID test done 11/9. [facility] only requires one negative test per week, [facility] needs one within 48 hours of discharge.   12:12 pm: Per cardiology, plan is to discharge Monday. [facility] unable to take patients on nebulizers or inhalers. [facility] thinks they will have a bed open up on their quarantine unit late Monday. Admissions coordinator will check for sure and follow up. Sent message to son to update.  3:07 pm: Insurance authorization still pending. CSW spoke to [facility] admissions coordinator who said they now won't have a bed until Tuesday. Sent message to son to notify and let him know that [facility] and [facility] have both offered beds and that [facility] is still pending.  4:48 pm: Son chose [facility]. They can take Sunday or Monday if stable and he has auth. [place] to [facility] authorization. They will call the weekend [facility] with the auth details. If discharging Sunday, asked MD to order COVID test tomorrow. If discharging Monday, asked him to order Sunday. Son is aware and agreeable.  Expected Discharge Plan: Skilled Nursing Facility Barriers to Discharge: Continued Medical Work up  Expected Discharge Plan and Services Expected Discharge Plan: [place] arrangements for the past 2 months: Single Family Home                                       Social Determinants of Health (SDOH) Interventions    Readmission Risk Interventions Readmission Risk Prevention Plan 08/14/2019  Transportation Screening Complete  HRI or Home Care Consult Complete  Social Work Consult for [place] Planning/Counseling Complete  Palliative Care Screening Complete  Medication Review Press photographer) Complete  Some recent data might be hidden

## 2019-08-17 NOTE — Progress Notes (Signed)
Progress Note  Patient Name: Charles [name] Date of Encounter: 08/17/2019  Primary Cardiologist: [name]  Subjective   Son at the bedside Sitting up in a chair this morning, still with significant shortness of breath but improving Denies significant sputum production Reports he is on 2 L nasal cannula oxygen at home Reports edema dramatically improving No specific complaints  Inpatient Medications    Scheduled Meds: . amiodarone  200 mg Oral Daily  . apixaban  2.5 mg Oral BID  . atorvastatin  20 mg Oral Daily  . budesonide (PULMICORT) nebulizer solution  0.25 mg Nebulization BID  . citalopram  20 mg Oral Daily  . finasteride  5 mg Oral Daily  . furosemide  80 mg Intravenous Q12H  . insulin aspart  0-5 Units Subcutaneous QHS  . insulin aspart  0-9 Units Subcutaneous TID WC  . insulin glargine  18 Units Subcutaneous BID  . ipratropium-albuterol  3 mL Nebulization TID  . lisinopril  10 mg Oral Daily  . mouth rinse  15 mL Mouth Rinse BID  . pantoprazole  40 mg Oral Daily  . sodium chloride flush  3 mL Intravenous Q12H  . sodium chloride flush  3 mL Intravenous Q12H  . tamsulosin  0.4 mg Oral QPC supper  . traZODone  50 mg Oral QHS  . vitamin C  500 mg Oral Daily   Continuous Infusions: . sodium chloride     PRN Meds: sodium chloride, acetaminophen, acetaminophen, albuterol, ondansetron (ZOFRAN) IV, sodium chloride flush   Vital Signs    Vitals:   08/16/19 2010 08/17/19 0539 08/17/19 0824 08/17/19 0842  BP: 114/60 (!) 106/47 (!) 110/48   Pulse: 74 70 68   Resp: 18 19 19    Temp: 98.3 F (36.8 C) 98.1 F (36.7 C) 98.2 F (36.8 C)   TempSrc: Oral Oral Oral   SpO2: 96% 95% (!) 87% 91%  Weight:  104 kg    Height:        Intake/Output Summary (Last 24 hours) at 08/17/2019 1418 Last data filed at 08/17/2019 1300 Gross per 24 hour  Intake 720 ml  Output 4750 ml  Net -4030 ml   Filed Weights   08/15/19 0512 08/15/19 2110 08/17/19 0539  Weight: 106.1 kg  103.4 kg 104 kg    Telemetry    V-paced - Personally Reviewed  ECG    No new tracings - Personally Reviewed  Physical Exam   GEN: No acute distress.   Neck: JVD, unable to estimate Cardiac: RRR, no murmurs, rubs, or gallops.  Respiratory: Coarse breath sounds bilaterally, dull at the left base (secondary to elevated left hemidiaphragm) GI: Soft, nontender, mildly distended.   MS: Trace bilateral pretibial edema; No deformity. Neuro:  Alert and oriented x 3; Nonfocal.  Psych: Normal affect.  Labs    Chemistry Recent Labs  Lab 08/13/19 1232  08/15/19 0432 08/16/19 0514 08/17/19 0534  NA 138   < > 139 138 139  K 4.7   < > 3.9 4.1 3.8  CL 98   < > 98 98 98  CO2 31   < > 32 33* 32  GLUCOSE 251*   < > 103* 124* 104*  BUN 42*   < > 36* 37* 38*  CREATININE 1.78*   < > 1.57* 1.59* 1.67*  CALCIUM 9.0   < > 8.9 8.8* 8.7*  PROT 7.2  --   --   --   --   ALBUMIN 3.7  --   --   --   --  AST 18  --   --   --   --   ALT 16  --   --   --   --   ALKPHOS 81  --   --   --   --   BILITOT 0.9  --   --   --   --   GFRNONAA 34*   < > 40* 39* 37*  GFRAA 40*   < > 46* 46* 43*  ANIONGAP 9   < > 9 7 9    < > = values in this interval not displayed.     Hematology Recent Labs  Lab 08/13/19 1232 08/14/19 0539 08/16/19 0514  WBC 5.8 6.1 6.1  RBC 3.34* 3.18* 3.14*  HGB 9.9* 9.4* 9.3*  HCT 31.4* 29.3* 30.0*  MCV 94.0 92.1 95.5  MCH 29.6 29.6 29.6  MCHC 31.5 32.1 31.0  RDW 13.3 13.1 13.2  PLT 168 157 152    Cardiac EnzymesNo results for input(s): TROPONINI in the last 168 hours. No results for input(s): TROPIPOC in the last 168 hours.   BNP Recent Labs  Lab 08/13/19 1235  BNP 244.0*     DDimer No results for input(s): DDIMER in the last 168 hours.   Radiology    No results found.  Cardiac Studies   2D Echo 08/14/2019: 1. Left ventricular ejection fraction, by visual estimation, is 60 to 65%. The left ventricle has normal function. There is mildly increased left  ventricular hypertrophy.  2. Elevated left atrial pressure.  3. Left ventricular diastolic parameters are consistent with Grade II diastolic dysfunction (pseudonormalization).  4. Global right ventricle has mildly reduced systolic function.The right ventricular size is normal. Mildly increased right ventricular wall thickness.  5. Pulmonary artery systolic pressure is moderately elevated (40-45 mmHg plus central venous pressure).  6. Left atrial size was mildly dilated.  7. Right atrial size was mildly dilated.  8. Moderate mitral annular calcification.  9. The mitral valve is degenerative. No evidence of mitral valve regurgitation. 10. There is mildly to moderately elevated transmitral gradient (mean gradient 6 mmHg). 11. The tricuspid valve is grossly normal. Tricuspid valve regurgitation is trivial. 12. Aortic valve regurgitation is not visualized. 13. The pulmonic valve was not well visualized. Pulmonic valve regurgitation is not visualized. 14. A pacer wire is visualized.  Patient Profile     85 y.o. 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   Assessment & Plan    A/P: Acute on chronic diastolic CHF == High fluid intake at home, milk, Pepsi -Would continue 1 more day Lasix IV twice daily REDS VEST performed yesterday less than 36% Likely getting close to euvolemic state ---Would try to perform a standing weight before he is discharged this weekend --On lisinopril -Blood pressure borderline low, will hold off on adding beta-blocker for now  -Not on spironolactone secondary to chronic kidney disease  CAD with stable angina, CABG On Eliquis, Lipitor Denies unstable angina symptoms Outpatient follow-up  Arrhythmia/atrial flutter Currently in paced rhythm On amiodarone, Eliquis  Aortic valve disorder, s/p TAVR Stable valve on recent echocardiogram  Chronic hypoxic respiratory failure/COPD Long smoking history Not on inhalers as an outpatient Consider starting steroid inhaler, Spiriva, albuterol as needed -Could consider outpatient follow-up with pulmonary given severity of disease  Renal artery stenosis Estimated 70% bilaterally Followed as an outpatient    Total encounter time more than 25 minutes  Greater than 50% was spent in counseling and coordination of care with the patient    For questions or updates, please contact [place] Please consult [email or web address] for contact info under Cardiology/STEMI.   Signed, Esmond Plants, MD, Ph.D [facility]

## 2019-08-17 NOTE — Progress Notes (Signed)
Physical Therapy Treatment Patient Details Name: Charles [name] MRN: [id number] DOB: 06-07-1932 Today's Date: 08/17/2019    History of Present Illness Pt is an 85 y.o. male presenting to hospital 11/9 with B LE swelling, abdominal swelling, and increased SOB.  Of note, pt with recent fall with hip pain and in ED 11/6 (imaging negative for acute finding).  Pt admitted with diastolic CHF.  PMH includes atrial flutter, CHF, compression fx L1, COPD, htn, pacemaker, s/p TAVR, CKD, CABG.    PT Comments    Pt is making limited progress towards goals with pt seated in recliner upon arrival. Pt reports he is still having pain in L hip, limiting functional mobility. Pt reports he doesn't feel he is able to ambulate this date, but agreeable to there-ex this date. All mobility performed on 2L of O2 with sats around 88%-93% on 2L of O2. Will continue to progress as able.   Follow Up Recommendations  SNF     Equipment Recommendations  Rolling walker with 5" wheels;3in1 (PT);Wheelchair (measurements PT);Wheelchair cushion (measurements PT)    Recommendations for Other Services       Precautions / Restrictions Precautions Precautions: Fall Restrictions Weight Bearing Restrictions: No    Mobility  Bed Mobility               General bed mobility comments: not performed as pt received in recliner  Transfers                 General transfer comment: pt refuses transfers due to L LE pain.  Ambulation/Gait                 Stairs             Wheelchair Mobility    Modified Rankin (Stroke Patients Only)       Balance Overall balance assessment: Needs assistance Sitting-balance support: No upper extremity supported;Feet supported Sitting balance-Leahy Scale: Good                                      Cognition Arousal/Alertness: Awake/alert Behavior During Therapy: WFL for tasks assessed/performed Overall Cognitive Status: Within Functional  Limits for tasks assessed                                        Exercises Other Exercises Other Exercises: seated ther-ex performed while in recliner. All ther-ex performed on 2L of O2 with sats hovering around 88%-93%. Alt. marching, LAQ with alt UE, shoulder raises, hip add squeezes, and forward leaning from back of chair with B UE support    General Comments        Pertinent Vitals/Pain Pain Assessment: Faces Faces Pain Scale: Hurts a little bit Pain Location: L hip Pain Descriptors / Indicators: Sore;Discomfort Pain Intervention(s): Limited activity within patient's tolerance    Home Living                      Prior Function            PT Goals (current goals can now be found in the care plan section) Acute Rehab PT Goals Patient Stated Goal: to regain my independence because I live at home alone PT Goal Formulation: With patient/family Time For Goal Achievement: 08/29/19 Potential to Achieve Goals: Good Progress towards PT goals:  Progressing toward goals    Frequency    Min 2X/week      PT Plan Current plan remains appropriate    Co-evaluation              AM-PAC PT "6 Clicks" Mobility   Outcome Measure  Help needed turning from your back to your side while in a flat bed without using bedrails?: A Little Help needed moving from lying on your back to sitting on the side of a flat bed without using bedrails?: A Little Help needed moving to and from a bed to a chair (including a wheelchair)?: A Lot Help needed standing up from a chair using your arms (e.g., wheelchair or bedside chair)?: A Lot Help needed to walk in hospital room?: Total Help needed climbing 3-5 steps with a railing? : Total 6 Click Score: 12    End of Session   Activity Tolerance: Patient limited by fatigue Patient left: in chair;with chair alarm set Nurse Communication: Mobility status PT Visit Diagnosis: Other abnormalities of gait and mobility  (R26.89);Muscle weakness (generalized) (M62.81);History of falling (Z91.81);Difficulty in walking, not elsewhere classified (R26.2);Pain Pain - Right/Left: Left Pain - part of body: Hip     Time: [id number] PT Time Calculation (min) (ACUTE ONLY): 12 min  Charges:  $Therapeutic Exercise: 8-22 mins                     Greggory Stallion, PT, DPT [phone]    Schneider Warchol 08/17/2019, 1:17 PM

## 2019-08-17 NOTE — Progress Notes (Addendum)
PROGRESS NOTE    Charles [name]  [id number] DOB: [birth year] DOA: 08/13/2019 PCP: [name], MD    Brief Narrative:  The patient presents to the ED today after having been in the ED last Friday for the same complaints, although he states that they weren't as severe at that time. He was given a one time injection of lasix in the ED and he was sent home on torsemide. He states that his swelling has "doubled" since that time, despite taking an increased dose of the torsemide as directed by cardiology.  He denies fevers, chills, cough, wheezing, nausea, vomiting, chest pain, constipation or diarrhea. He fell once a week ago. He is feeling weak.   In the ED he is found to be saturating in the mid-nineties on 2 Liters which is his baseline oxygen requirement. Creatinine appears to be at about baseline at 1.78. BNP is 244 . This is increased only slightly over the last BNP which was drawn on 05/24/2019. He is afebrile, somewhat hypotensive with a systolic blood pressure of 103/54. Pulse has varied between 71 and 140 as recorded (?). His glucose is 251. EKG demonstrates a ventricularly paced rhythm.   [facility] have been consulted to admit the patient for further evaluation and care. Cardiology [facility]) has been consulted by the ED. Echocardiogram has been ordered.  Patient started on IV Lasix.  Cardiology following.    Assessment & Plan:   Principal Problem:   Acute on chronic heart failure with preserved ejection fraction (HFpEF) (HCC) Active Problems:   Diabetes mellitus type 2, uncontrolled, with complications ([place])   HYPERCHOLESTEROLEMIA   MDD (major depressive disorder), recurrent episode, moderate (HCC)   Essential hypertension   GERD   Carotid arterial disease (HCC)   Chronic radicular pain of lower back   Chronic anticoagulation   Acute on chronic diastolic CHF (congestive heart failure) (HCC)   Coronary artery disease involving native coronary artery of native  heart without angina pectoris   Paroxysmal atrial flutter (HCC)   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  Cardiac enzymes minimally elevated however seem to plateaued.  BNP was 244.  Patient denies any ongoing chest pain.  Patient then states improvement with shortness of breath and symptoms.  2D echo with a EF of 60 to 65%, mildly increased LVH, elevated left atrial pressure, grade 2 diastolic dysfunction, mildly reduced right ventricular systolic function.  Patient with a urine output of 4 L over the past 24 hours.  Patient is -8.610 L during this hospitalization.  Current weight of 229.2 pounds from 228 pounds from 234 pounds from 232 pounds from 229 pounds.  Creatinine seems to be plateauing.  Continue Lasix 80 mg IV every 12 hours, Lipitor, lisinopril.  Per cardiology patient to try REDS VEST yesterday which was less than 36%..  Due to borderline blood pressure cardiology recommending to hold off on beta-blocker for now.  Not on spironolactone secondary to chronic kidney disease.  Cardiology following and appreciate input and recommendations.  2.  Hypotension Resolved.  Monitor closely with diuresis.  3.  COPD Stable.  Will place on Pulmicort and scheduled duo nebs.  Will likely need Spiriva and Symbicort on discharge.   4.  Poorly controlled diabetes mellitus type 2 Hemoglobin A1c noted to be 11.0 02/23/2019.  CBG of 119 the morning  of 08/15/2019.  Continue current dose of Lantus to 18 units twice daily.  Continue sliding scale insulin.  Diabetes coordinator following.    5.  History of paroxysmal atrial flutter Ventricularly paced.  Continue amiodarone.  Eliquis for anticoagulation.  Per cardiology.   6.  Bilateral renal artery stenosis Patient started on an ACE inhibitor.  Monitor renal function  closely.  Continue statin.  7.  Status post TAVR/status post PPM Pacemaker interrogated per cardiology.  Small leak noted on echocardiogram from TAVR.  Per cardiology.   DVT prophylaxis: Eliquis Code Status: Full Family Communication: Updated patient and son at bedside. Disposition Plan: SNF when clinically improved and cleared by cardiology with palliative care following.   Consultants:   Palliative care: Asencion Gowda, NP 08/14/2019  Cardiology: Dr. Harrell Gave End 08/14/2019  Procedures:   2D echo 08/14/2019  Chest x-ray 08/13/2019  Antimicrobials:   None   Subjective: Patient sitting up in chair.  Denies any chest pain.  No significant shortness of breath.  Feeling better.  Objective: Vitals:   08/16/19 2010 08/17/19 0539 08/17/19 0824 08/17/19 0842  BP: 114/60 (!) 106/47 (!) 110/48   Pulse: 74 70 68   Resp: 18 19 19    Temp: 98.3 F (36.8 C) 98.1 F (36.7 C) 98.2 F (36.8 C)   TempSrc: Oral Oral Oral   SpO2: 96% 95% (!) 87% 91%  Weight:  104 kg    Height:        Intake/Output Summary (Last 24 hours) at 08/17/2019 1045 Last data filed at 08/17/2019 0900 Gross per 24 hour  Intake 720 ml  Output 4050 ml  Net -3330 ml   Filed Weights   08/15/19 0512 08/15/19 2110 08/17/19 0539  Weight: 106.1 kg 103.4 kg 104 kg    Examination:  General exam: NAD. Respiratory system: Scattered coarse breath sounds.  Decreased breath sounds in the bases.  Speaking in full sentences.  Normal respiratory effort.  Cardiovascular system: RRR no murmurs rubs or gallops.  Trace bilateral lower extremity edema.  Gastrointestinal system: Abdomen is soft, nontender, mildly distended, positive bowel sounds.  No rebound.  No guarding.  Central nervous system: Alert and oriented. No focal neurological deficits. Extremities: Symmetric 5 x 5 power. Skin: No rashes, lesions or ulcers Psychiatry: Judgement and insight appear normal. Mood & affect appropriate.     Data Reviewed:  I have personally reviewed following labs and imaging studies  CBC: Recent Labs  Lab 08/13/19 1232 08/14/19 0539 08/16/19 0514  WBC 5.8 6.1 6.1  NEUTROABS 4.1  --   --   HGB 9.9* 9.4* 9.3*  HCT 31.4* 29.3* 30.0*  MCV 94.0 92.1 95.5  PLT 168 157 0000000   Basic Metabolic Panel: Recent Labs  Lab 08/13/19 1232 08/14/19 0539 08/15/19 0432 08/16/19 0514 08/17/19 0534  NA 138 142 139 138 139  K 4.7 4.2 3.9 4.1 3.8  CL 98 101 98 98 98  CO2 31 33* 32 33* 32  GLUCOSE 251* 81 103* 124* 104*  BUN 42* 39* 36* 37* 38*  CREATININE 1.78* 1.76* 1.57* 1.59* 1.67*  CALCIUM 9.0 9.0 8.9 8.8* 8.7*   GFR: Estimated Creatinine Clearance: 41.7 mL/min (A) (by C-G formula based on SCr of 1.67 mg/dL (H)). Liver Function Tests: Recent Labs  Lab 08/13/19 1232  AST 18  ALT 16  ALKPHOS 81  BILITOT 0.9  PROT 7.2  ALBUMIN 3.7   No results for input(s): LIPASE, AMYLASE in the last 168 hours. No results for input(s): AMMONIA in  the last 168 hours. Coagulation Profile: No results for input(s): INR, PROTIME in the last 168 hours. Cardiac Enzymes: No results for input(s): CKTOTAL, CKMB, CKMBINDEX, TROPONINI in the last 168 hours. BNP (last 3 results) Recent Labs    01/26/19 0000  PROBNP 1,162*   HbA1C: No results for input(s): HGBA1C in the last 72 hours. CBG: Recent Labs  Lab 08/16/19 0834 08/16/19 1206 08/16/19 1657 08/16/19 2137 08/17/19 0826  GLUCAP 118* 294* 277* 245* 119*   Lipid Profile: No results for input(s): CHOL, HDL, LDLCALC, TRIG, CHOLHDL, LDLDIRECT in the last 72 hours. Thyroid Function Tests: No results for input(s): TSH, T4TOTAL, FREET4, T3FREE, THYROIDAB in the last 72 hours. Anemia Panel: No results for input(s): VITAMINB12, FOLATE, FERRITIN, TIBC, IRON, RETICCTPCT in the last 72 hours. Sepsis Labs: No results for input(s): PROCALCITON, LATICACIDVEN in the last 168 hours.  Recent Results (from the past 240 hour(s))  SARS CORONAVIRUS 2 (TAT 6-24 HRS)  Nasopharyngeal Nasopharyngeal Swab     Status: None   Collection Time: 08/10/19 10:25 AM   Specimen: Nasopharyngeal Swab  Result Value Ref Range Status   SARS Coronavirus 2 NEGATIVE NEGATIVE Final    Comment: (NOTE) SARS-CoV-2 target nucleic acids are NOT DETECTED. The SARS-CoV-2 RNA is generally detectable in upper and lower respiratory specimens during the acute phase of infection. Negative results do not preclude SARS-CoV-2 infection, do not rule out co-infections with other pathogens, and should not be used as the sole basis for treatment or other patient management decisions. Negative results must be combined with clinical observations, patient history, and epidemiological information. The expected result is Negative. Fact Sheet for Patients: SugarRoll.be Fact Sheet for Healthcare Providers: https://www.woods-mathews.com/ This test is not yet approved or cleared by the [place] FDA and  has been authorized for detection and/or diagnosis of SARS-CoV-2 by FDA under an Emergency Use Authorization (EUA). This EUA will remain  in effect (meaning this test can be used) for the duration of the COVID-19 declaration under Section 56 4(b)(1) of the Act, 21 U.S.C. section 360bbb-3(b)(1), unless the authorization is terminated or revoked sooner. Performed at [facility], [place] [address] [place], [place] [address]   SARS CORONAVIRUS 2 (TAT 6-24 HRS) Nasopharyngeal Nasopharyngeal Swab     Status: None   Collection Time: 08/13/19  5:07 PM   Specimen: Nasopharyngeal Swab  Result Value Ref Range Status   SARS Coronavirus 2 NEGATIVE NEGATIVE Final    Comment: (NOTE) SARS-CoV-2 target nucleic acids are NOT DETECTED. The SARS-CoV-2 RNA is generally detectable in upper and lower respiratory specimens during the acute phase of infection. Negative results do not preclude SARS-CoV-2 infection, do not rule out co-infections with other pathogens,  and should not be used as the sole basis for treatment or other patient management decisions. Negative results must be combined with clinical observations, patient history, and epidemiological information. The expected result is Negative. Fact Sheet for Patients: SugarRoll.be Fact Sheet for Healthcare Providers: https://www.woods-mathews.com/ This test is not yet approved or cleared by the [place] FDA and  has been authorized for detection and/or diagnosis of SARS-CoV-2 by FDA under an Emergency Use Authorization (EUA). This EUA will remain  in effect (meaning this test can be used) for the duration of the COVID-19 declaration under Section 56 4(b)(1) of the Act, 21 U.S.C. section 360bbb-3(b)(1), unless the authorization is terminated or revoked sooner. Performed at [facility], [place] [address], [place], [place] [address]  Radiology Studies: No results found.      Scheduled Meds:  amiodarone  200 mg Oral Daily   apixaban  2.5 mg Oral BID   atorvastatin  20 mg Oral Daily   citalopram  20 mg Oral Daily   finasteride  5 mg Oral Daily   furosemide  80 mg Intravenous Q12H   insulin aspart  0-5 Units Subcutaneous QHS   insulin aspart  0-9 Units Subcutaneous TID WC   insulin glargine  18 Units Subcutaneous BID   lisinopril  10 mg Oral Daily   mouth rinse  15 mL Mouth Rinse BID   pantoprazole  40 mg Oral Daily   sodium chloride flush  3 mL Intravenous Q12H   sodium chloride flush  3 mL Intravenous Q12H   tamsulosin  0.4 mg Oral QPC supper   traZODone  50 mg Oral QHS   vitamin C  500 mg Oral Daily   Continuous Infusions:  sodium chloride       LOS: 4 days    Time spent: 35 minutes    Irine Seal, MD [facility]  If 7PM-7AM, please contact night-coverage [email or web address] 08/17/2019, 10:45 AM

## 2019-08-18 LAB — GLUCOSE, CAPILLARY
Glucose-Capillary: 96 mg/dL (ref 70–99)
Glucose-Capillary: 210 mg/dL — ABNORMAL HIGH (ref 70–99)
Glucose-Capillary: 230 mg/dL — ABNORMAL HIGH (ref 70–99)
Glucose-Capillary: 291 mg/dL — ABNORMAL HIGH (ref 70–99)

## 2019-08-18 LAB — BASIC METABOLIC PANEL
Anion gap: 9 (ref 5–15)
BUN: 46 mg/dL — ABNORMAL HIGH (ref 8–23)
CO2: 33 mmol/L — ABNORMAL HIGH (ref 22–32)
Calcium: 8.8 mg/dL — ABNORMAL LOW (ref 8.9–10.3)
Chloride: 97 mmol/L — ABNORMAL LOW (ref 98–111)
Creatinine, Ser: 1.79 mg/dL — ABNORMAL HIGH (ref 0.61–1.24)
GFR calc Af Amer: 39 mL/min — ABNORMAL LOW (ref >60)
GFR calc non Af Amer: 34 mL/min — ABNORMAL LOW (ref >60)
Glucose, Bld: 116 mg/dL — ABNORMAL HIGH (ref 70–99)
Potassium: 3.9 mmol/L (ref 3.5–5.1)
Sodium: 139 mmol/L (ref 135–145)

## 2019-08-18 MED ORDER — INSULIN GLARGINE 100 UNIT/ML ~~LOC~~ SOLN
22.0000 [IU] | Freq: Two times a day (BID) | SUBCUTANEOUS | Status: DC
  Administered 2019-08-18 – 2019-08-20 (×5): 22 [IU] via SUBCUTANEOUS
  Filled 2019-08-18 (×7): qty 0.22

## 2019-08-18 MED ORDER — TORSEMIDE 20 MG PO TABS
60.0000 mg | ORAL_TABLET | Freq: Every day | ORAL | Status: DC
  Administered 2019-08-19 – 2019-08-20 (×2): 60 mg via ORAL
  Filled 2019-08-18 (×2): qty 3

## 2019-08-18 MED ORDER — INSULIN GLARGINE 100 UNIT/ML ~~LOC~~ SOLN: 20.0000 [IU] | Freq: Two times a day (BID) | SUBCUTANEOUS | Status: DC

## 2019-08-18 NOTE — TOC Progression Note (Signed)
Transition of Care [facility]) - Progression Note    Patient Details  Name: DREYON SEBRING MRN: [id number] Date of Birth: Sep 05, 1932  Transition of [facility]) CM/SW Contact  Latanya Maudlin, RN Phone Number: 08/18/2019, 11:27 AM  Clinical Narrative:  This CM was notified by [facility] that we have authorization for Skilled Nursing. Auth # is [id number]. Navi reference number is [id number]. Notified Olivia Mackie at [employer] of authorization. Auth is good for 5 days. Barrier remains that patient is on Lasix gtt. Per MD we can expected discharge in the next 1-2 days pending improvement. Requested update COVID testing that is need for [facility] to accept patient.    Expected Discharge Plan: Skilled Nursing Facility Barriers to Discharge: Continued Medical Work up  Expected Discharge Plan and Services Expected Discharge Plan: [place] arrangements for the past 2 months: Single Family Home                                       Social Determinants of Health (SDOH) Interventions    Readmission Risk Interventions Readmission Risk Prevention Plan 08/14/2019  Transportation Screening Complete  HRI or Home Care Consult Complete  Social Work Consult for [place] Planning/Counseling Complete  Palliative Care Screening Complete  Medication Review [employer]) Complete  Some recent data might be hidden

## 2019-08-18 NOTE — Progress Notes (Addendum)
Progress Note  Patient Name: Charles [name] Date of Encounter: 08/18/2019  Primary Cardiologist: [name]  Subjective   Dyspnea and lower extremity swelling much improved. No chest pain. Documented UOP of 1.1 L for the pat 24 hours with a net - 9.4 L for the admission. Weight 104-->103.1 kg. BUN/SCr 38/1.67-->46/1.79, K+ 3.8-->3.9.   Inpatient Medications    Scheduled Meds: . amiodarone  200 mg Oral Daily  . apixaban  2.5 mg Oral BID  . atorvastatin  20 mg Oral Daily  . budesonide (PULMICORT) nebulizer solution  0.25 mg Nebulization BID  . citalopram  20 mg Oral Daily  . finasteride  5 mg Oral Daily  . furosemide  80 mg Intravenous Q12H  . insulin aspart  0-5 Units Subcutaneous QHS  . insulin aspart  0-9 Units Subcutaneous TID WC  . insulin glargine  22 Units Subcutaneous BID  . ipratropium-albuterol  3 mL Nebulization TID  . lisinopril  10 mg Oral Daily  . mouth rinse  15 mL Mouth Rinse BID  . pantoprazole  40 mg Oral Daily  . sodium chloride flush  3 mL Intravenous Q12H  . sodium chloride flush  3 mL Intravenous Q12H  . tamsulosin  0.4 mg Oral QPC supper  . traZODone  50 mg Oral QHS  . vitamin C  500 mg Oral Daily   Continuous Infusions: . sodium chloride     PRN Meds: sodium chloride, acetaminophen, acetaminophen, albuterol, ondansetron (ZOFRAN) IV, sodium chloride flush   Vital Signs    Vitals:   08/17/19 1605 08/17/19 1956 08/17/19 2031 08/18/19 0537  BP:  (!) 113/56  137/64  Pulse:  73  73  Resp:  20  20  Temp: 98.1 F (36.7 C) 98.2 F (36.8 C)  97.9 F (36.6 C)  TempSrc: Oral Oral  Oral  SpO2:  96% 95% 96%  Weight:    103.1 kg  Height:        Intake/Output Summary (Last 24 hours) at 08/18/2019 0742 Last data filed at 08/18/2019 0541 Gross per 24 hour  Intake 720 ml  Output 2150 ml  Net -1430 ml   Filed Weights   08/15/19 2110 08/17/19 0539 08/18/19 0537  Weight: 103.4 kg 104 kg 103.1 kg    Telemetry    V-paced, 80s bpm - Personally  Reviewed  ECG    No new tracings - Personally Reviewed  Physical Exam   GEN: No acute distress.   Neck: No JVD. Cardiac: RRR, no murmurs, rubs, or gallops.  Respiratory: Improving coarse breath sounds bilaterally with diminished breath sounds at the bases (secdonary to elevated left hemidiaphragm).  GI: Soft, nontender, non-distended.   MS: No edema; No deformity. Neuro:  Alert and oriented x 3; Nonfocal.  Psych: Normal affect.  Labs    Chemistry Recent Labs  Lab 08/13/19 1232  08/16/19 0514 08/17/19 0534 08/18/19 0506  NA 138   < > 138 139 139  K 4.7   < > 4.1 3.8 3.9  CL 98   < > 98 98 97*  CO2 31   < > 33* 32 33*  GLUCOSE 251*   < > 124* 104* 116*  BUN 42*   < > 37* 38* 46*  CREATININE 1.78*   < > 1.59* 1.67* 1.79*  CALCIUM 9.0   < > 8.8* 8.7* 8.8*  PROT 7.2  --   --   --   --   ALBUMIN 3.7  --   --   --   --  AST 18  --   --   --   --   ALT 16  --   --   --   --   ALKPHOS 81  --   --   --   --   BILITOT 0.9  --   --   --   --   GFRNONAA 34*   < > 39* 37* 34*  GFRAA 40*   < > 46* 43* 39*  ANIONGAP 9   < > 7 9 9    < > = values in this interval not displayed.     Hematology Recent Labs  Lab 08/13/19 1232 08/14/19 0539 08/16/19 0514  WBC 5.8 6.1 6.1  RBC 3.34* 3.18* 3.14*  HGB 9.9* 9.4* 9.3*  HCT 31.4* 29.3* 30.0*  MCV 94.0 92.1 95.5  MCH 29.6 29.6 29.6  MCHC 31.5 32.1 31.0  RDW 13.3 13.1 13.2  PLT 168 157 152    Cardiac EnzymesNo results for input(s): TROPONINI in the last 168 hours. No results for input(s): TROPIPOC in the last 168 hours.   BNP Recent Labs  Lab 08/13/19 1235  BNP 244.0*     DDimer No results for input(s): DDIMER in the last 168 hours.   Radiology    No results found.  Cardiac Studies   2D Echo 08/14/2019: 1. Left ventricular ejection fraction, by visual estimation, is 60 to 65%. The left ventricle has normal function. There is mildly increased left ventricular hypertrophy. 2. Elevated left atrial pressure. 3.  Left ventricular diastolic parameters are consistent with Grade II diastolic dysfunction (pseudonormalization). 4. Global right ventricle has mildly reduced systolic function.The right ventricular size is normal. Mildly increased right ventricular wall thickness. 5. Pulmonary artery systolic pressure is moderately elevated (40-45 mmHg plus central venous pressure). 6. Left atrial size was mildly dilated. 7. Right atrial size was mildly dilated. 8. Moderate mitral annular calcification. 9. The mitral valve is degenerative. No evidence of mitral valve regurgitation. 10. There is mildly to moderately elevated transmitral gradient (mean gradient 6 mmHg). 11. The tricuspid valve is grossly normal. Tricuspid valve regurgitation is trivial. 12. Aortic valve regurgitation is not visualized. 13. The pulmonic valve was not well visualized. Pulmonic valve regurgitation is not visualized. 14. A pacer wire is visualized.  Patient Profile     85 y.o. male with history of HFpEF with frequent decompensations and admissions, CAD s/p CABG x 4 (LIMA to LAD, SVG to RCA, OM branch to ramus), s/p TAVR 12/22/2018, PPM, hypertension, hyperlipidemia, former smoker (18 pk years),carotid artery disease with history of atrial flutter s/p TEE cardioversion in the remote past, chronic hypoxic respiratory failure with hypoxia on supplemental home oxygen, IDDM, and renal artery stenosis with 70% bilateral stenosis who we are seeing for volume overload.  Assessment & Plan    1. Acute on chronic HFpEF: -Drinks large volumes of fluids at home -ReDs vest of 34 on 11/12 -With slightly worsening renal function, he is likely euvolemic -Hold IV Lasix -Based on renal function on 11/15, may consider resuming PO Lasix -Standing weight prior to discharge  -Not on spironolactone secondary to CKD  2. CAD s/p CABG with stable angina: -No unstable symptoms -Mildly elevated HS-Tn not consistent with ACS -Eliquis in place of ASA  -Lipitor  -Lisinopril, monitor renal function in the setting of RAS -Outpatient follow up  3. Atrial flutter: -V paced rhythm -Eliquis 2.5 mg bid (age and renal function) -Amiodarone -LFT normal this admission -Recent TSH normal  4. Chronic hypoxic respiratory  failure secondary to COPD: -Nebs/inhalers per IM  5. RAS: -70% bilaterally -Outpatient follow up  6. Aortic valve disease s/p TAVR: -Echo as above without significant peri-valvular leak -SBE PPX  7. Acute on CKD stage III: -Hold IV Lasix -Trend   For questions or updates, please contact [place] Please consult [email or web address] for contact info under Cardiology/STEMI.    Signed, Christell Faith, PA-C [facility] Pager: [phone] 08/18/2019, 7:42 AM  I have seen and examined this patient with Christell Faith.  Agree with above, note added to reflect my findings.  On exam, RRR, no murmurs, lungs clear.  Patient appears to be euvolemic.  His IV diuresis has been stopped.  I put him back on p.o. torsemide plan to start tomorrow.  Should he do well overnight tonight, or later in the day today, would be okay for discharge from a cardiology perspective.  We Jarryd Gratz continue his lisinopril at the current dose as it is controlling his blood pressure.  He would need follow-up in cardiology clinic.  Kaylana Fenstermacher M. Massimiliano Rohleder MD 08/18/2019 12:55 PM

## 2019-08-18 NOTE — Progress Notes (Signed)
PROGRESS NOTE    Charles [name]  [id number] DOB: 09-21-1932 DOA: 08/13/2019 PCP: [name], MD    Brief Narrative:  The patient presents to the ED today after having been in the ED last Friday for the same complaints, although he states that they weren't as severe at that time. He was given a one time injection of lasix in the ED and he was sent home on torsemide. He states that his swelling has "doubled" since that time, despite taking an increased dose of the torsemide as directed by cardiology.  He denies fevers, chills, cough, wheezing, nausea, vomiting, chest pain, constipation or diarrhea. He fell once a week ago. He is feeling weak.   In the ED he is found to be saturating in the mid-nineties on 2 Liters which is his baseline oxygen requirement. Creatinine appears to be at about baseline at 1.78. BNP is 244 . This is increased only slightly over the last BNP which was drawn on 05/24/2019. He is afebrile, somewhat hypotensive with a systolic blood pressure of 103/54. Pulse has varied between 71 and 140 as recorded (?). His glucose is 251. EKG demonstrates a ventricularly paced rhythm.   [facility] have been consulted to admit the patient for further evaluation and care. Cardiology [facility]) has been consulted by the ED. Echocardiogram has been ordered.  Patient started on IV Lasix.  Cardiology following.    Assessment & Plan:   Principal Problem:   Acute on chronic heart failure with preserved ejection fraction (HFpEF) (HCC) Active Problems:   Diabetes mellitus type 2, uncontrolled, with complications ([place])   HYPERCHOLESTEROLEMIA   MDD (major depressive disorder), recurrent episode, moderate (HCC)   Essential hypertension   GERD   Carotid arterial disease (HCC)   Chronic radicular pain of lower back   Chronic anticoagulation   Acute on chronic diastolic CHF (congestive heart failure) (HCC)   Coronary artery disease involving native coronary artery of native  heart without angina pectoris   Paroxysmal atrial flutter (HCC)   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  Cardiac enzymes minimally elevated however seem to plateaued.  BNP was 244.  Patient denies any ongoing chest pain.  Patient then states improvement with shortness of breath and symptoms.  2D echo with a EF of 60 to 65%, mildly increased LVH, elevated left atrial pressure, grade 2 diastolic dysfunction, mildly reduced right ventricular systolic function.  Patient with a urine output of 2.150 L over the past 24 hours.  Patient is - 10.370 L during this hospitalization.  Current weight of 223.8 pounds from 229.2 pounds from 228 pounds from 234 pounds from 232 pounds from 229 pounds.  Creatinine seems to be plateauing.  IV Lasix has been discontinued per cardiology and patient to be transition to torsemide starting tomorrow.  Per cardiology patient was placed on REDS VEST(08/16/2019) which was less than 36%..  Due to borderline blood pressure cardiology recommending to hold off on beta-blocker for now.  Not on spironolactone secondary to chronic kidney disease.  Cardiology following and appreciate input and recommendations.  2.  Hypotension Resolved.  Monitor closely with diuresis.  3.  COPD Continue Pulmicort and scheduled duo nebs stable.  Will likely need Spiriva and Symbicort on discharge.   4.  Poorly controlled diabetes mellitus type 2 Hemoglobin A1c noted to be 11.0  02/23/2019.  CBG of 96 this morning.  Patient noted to have CBG in the 200s yesterday.  Increase Lantus to 22 units twice daily.  Continue sliding scale insulin.  Follow.   5.  History of paroxysmal atrial flutter Ventricularly paced.  Continue amiodarone.  Eliquis for anticoagulation.  Per cardiology.   6.  Bilateral renal artery  stenosis Patient started on an ACE inhibitor.  Monitor renal function closely.  Continue statin.  7.  Status post TAVR/status post PPM Pacemaker interrogated per cardiology.  Small leak noted on echocardiogram from TAVR.  Per cardiology.   DVT prophylaxis: Eliquis Code Status: Full Family Communication: Updated patient.  No family at bedside. Disposition Plan: SNF when clinically improved and cleared by cardiology with palliative care following hopefully in the next 24 to 48 hours.   Consultants:   Palliative care: Asencion Gowda, NP 08/14/2019  Cardiology: Dr. Harrell Gave End 08/14/2019  Procedures:   2D echo 08/14/2019  Chest x-ray 08/13/2019  Antimicrobials:   None   Subjective: Patient sitting up in bed.  States shortness of breath improving.  Denies any chest pain no abdominal pain.   Objective: Vitals:   08/18/19 0537 08/18/19 0750 08/18/19 0840 08/18/19 0902  BP: 137/64 (!) 96/53  (!) 113/58  Pulse: 73 69 72 78  Resp: 20 16 18    Temp: 97.9 F (36.6 C) (!) 97.5 F (36.4 C)    TempSrc: Oral Oral    SpO2: 96% 95% 93%   Weight: 103.1 kg     Height:        Intake/Output Summary (Last 24 hours) at 08/18/2019 1149 Last data filed at 08/18/2019 0541 Gross per 24 hour  Intake 480 ml  Output 2150 ml  Net -1670 ml   Filed Weights   08/15/19 2110 08/17/19 0539 08/18/19 0537  Weight: 103.4 kg 104 kg 103.1 kg    Examination:  General exam: NAD. Respiratory system: Decreased breath sounds in the bases with some crackles.  Decreased coarse breath sounds.  Speaking in full sentences.  Normal respiratory effort.  Cardiovascular system: Regular rate and rhythm no murmurs rubs or gallops.  Trace bilateral lower extremity edema.  Gastrointestinal system: Abdomen is nontender, nondistended, soft, positive bowel sounds.  No rebound.  No guarding. Central nervous system: Alert and oriented. No focal neurological deficits. Extremities: Symmetric 5 x 5 power. Skin: No  rashes, lesions or ulcers Psychiatry: Judgement and insight appear normal. Mood & affect appropriate.     Data Reviewed: I have personally reviewed following labs and imaging studies  CBC: Recent Labs  Lab 08/13/19 1232 08/14/19 0539 08/16/19 0514  WBC 5.8 6.1 6.1  NEUTROABS 4.1  --   --   HGB 9.9* 9.4* 9.3*  HCT 31.4* 29.3* 30.0*  MCV 94.0 92.1 95.5  PLT 168 157 0000000   Basic Metabolic Panel: Recent Labs  Lab 08/14/19 0539 08/15/19 0432 08/16/19 0514 08/17/19 0534 08/18/19 0506  NA 142 139 138 139 139  K 4.2 3.9 4.1 3.8 3.9  CL 101 98 98 98 97*  CO2 33* 32 33* 32 33*  GLUCOSE 81 103* 124* 104* 116*  BUN 39* 36* 37* 38* 46*  CREATININE 1.76* 1.57* 1.59* 1.67* 1.79*  CALCIUM 9.0 8.9 8.8* 8.7* 8.8*   GFR: Estimated Creatinine Clearance: 38.8 mL/min (A) (by C-G formula based on SCr of 1.79 mg/dL (H)). Liver Function Tests: Recent Labs  Lab 08/13/19 1232  AST 18  ALT 16  ALKPHOS 81  BILITOT 0.9  PROT 7.2  ALBUMIN  3.7   No results for input(s): LIPASE, AMYLASE in the last 168 hours. No results for input(s): AMMONIA in the last 168 hours. Coagulation Profile: No results for input(s): INR, PROTIME in the last 168 hours. Cardiac Enzymes: No results for input(s): CKTOTAL, CKMB, CKMBINDEX, TROPONINI in the last 168 hours. BNP (last 3 results) Recent Labs    01/26/19 0000  PROBNP 1,162*   HbA1C: No results for input(s): HGBA1C in the last 72 hours. CBG: Recent Labs  Lab 08/17/19 1153 08/17/19 1647 08/17/19 2046 08/18/19 0751 08/18/19 1128  GLUCAP 194* 270* 285* 96 210*   Lipid Profile: No results for input(s): CHOL, HDL, LDLCALC, TRIG, CHOLHDL, LDLDIRECT in the last 72 hours. Thyroid Function Tests: No results for input(s): TSH, T4TOTAL, FREET4, T3FREE, THYROIDAB in the last 72 hours. Anemia Panel: No results for input(s): VITAMINB12, FOLATE, FERRITIN, TIBC, IRON, RETICCTPCT in the last 72 hours. Sepsis Labs: No results for input(s): PROCALCITON,  LATICACIDVEN in the last 168 hours.  Recent Results (from the past 240 hour(s))  SARS CORONAVIRUS 2 (TAT 6-24 HRS) Nasopharyngeal Nasopharyngeal Swab     Status: None   Collection Time: 08/10/19 10:25 AM   Specimen: Nasopharyngeal Swab  Result Value Ref Range Status   SARS Coronavirus 2 NEGATIVE NEGATIVE Final    Comment: (NOTE) SARS-CoV-2 target nucleic acids are NOT DETECTED. The SARS-CoV-2 RNA is generally detectable in upper and lower respiratory specimens during the acute phase of infection. Negative results do not preclude SARS-CoV-2 infection, do not rule out co-infections with other pathogens, and should not be used as the sole basis for treatment or other patient management decisions. Negative results must be combined with clinical observations, patient history, and epidemiological information. The expected result is Negative. Fact Sheet for Patients: SugarRoll.be Fact Sheet for Healthcare Providers: https://www.woods-mathews.com/ This test is not yet approved or cleared by the [place] FDA and  has been authorized for detection and/or diagnosis of SARS-CoV-2 by FDA under an Emergency Use Authorization (EUA). This EUA will remain  in effect (meaning this test can be used) for the duration of the COVID-19 declaration under Section 56 4(b)(1) of the Act, 21 U.S.C. section 360bbb-3(b)(1), unless the authorization is terminated or revoked sooner. Performed at [facility], [place] [address], [place], [place] [address]   SARS CORONAVIRUS 2 (TAT 6-24 HRS) Nasopharyngeal Nasopharyngeal Swab     Status: None   Collection Time: 08/13/19  5:07 PM   Specimen: Nasopharyngeal Swab  Result Value Ref Range Status   SARS Coronavirus 2 NEGATIVE NEGATIVE Final    Comment: (NOTE) SARS-CoV-2 target nucleic acids are NOT DETECTED. The SARS-CoV-2 RNA is generally detectable in upper and lower respiratory specimens during the acute phase of  infection. Negative results do not preclude SARS-CoV-2 infection, do not rule out co-infections with other pathogens, and should not be used as the sole basis for treatment or other patient management decisions. Negative results must be combined with clinical observations, patient history, and epidemiological information. The expected result is Negative. Fact Sheet for Patients: SugarRoll.be Fact Sheet for Healthcare Providers: https://www.woods-mathews.com/ This test is not yet approved or cleared by the [place] FDA and  has been authorized for detection and/or diagnosis of SARS-CoV-2 by FDA under an Emergency Use Authorization (EUA). This EUA will remain  in effect (meaning this test can be used) for the duration of the COVID-19 declaration under Section 56 4(b)(1) of the Act, 21 U.S.C. section 360bbb-3(b)(1), unless the authorization is terminated or revoked sooner. Performed at  [facility], [place] [address] [place], [place] [address]          Radiology Studies: No results found.      Scheduled Meds: . amiodarone  200 mg Oral Daily  . apixaban  2.5 mg Oral BID  . atorvastatin  20 mg Oral Daily  . budesonide (PULMICORT) nebulizer solution  0.25 mg Nebulization BID  . citalopram  20 mg Oral Daily  . finasteride  5 mg Oral Daily  . insulin aspart  0-5 Units Subcutaneous QHS  . insulin aspart  0-9 Units Subcutaneous TID WC  . insulin glargine  22 Units Subcutaneous BID  . ipratropium-albuterol  3 mL Nebulization TID  . lisinopril  10 mg Oral Daily  . mouth rinse  15 mL Mouth Rinse BID  . pantoprazole  40 mg Oral Daily  . sodium chloride flush  3 mL Intravenous Q12H  . sodium chloride flush  3 mL Intravenous Q12H  . tamsulosin  0.4 mg Oral QPC supper  . traZODone  50 mg Oral QHS  . vitamin C  500 mg Oral Daily   Continuous Infusions: . sodium chloride       LOS: 5 days    Time spent: 35 minutes     Irine Seal, MD [facility]  If 7PM-7AM, please contact night-coverage [email or web address] 08/18/2019, 11:49 AM

## 2019-08-19 LAB — BASIC METABOLIC PANEL
Anion gap: 9 (ref 5–15)
BUN: 42 mg/dL — ABNORMAL HIGH (ref 8–23)
CO2: 32 mmol/L (ref 22–32)
Calcium: 8.8 mg/dL — ABNORMAL LOW (ref 8.9–10.3)
Chloride: 97 mmol/L — ABNORMAL LOW (ref 98–111)
Creatinine, Ser: 1.62 mg/dL — ABNORMAL HIGH (ref 0.61–1.24)
GFR calc Af Amer: 45 mL/min — ABNORMAL LOW (ref >60)
GFR calc non Af Amer: 38 mL/min — ABNORMAL LOW (ref >60)
Glucose, Bld: 89 mg/dL (ref 70–99)
Potassium: 3.9 mmol/L (ref 3.5–5.1)
Sodium: 138 mmol/L (ref 135–145)

## 2019-08-19 LAB — CBC
HCT: 27.9 % — ABNORMAL LOW (ref 39.0–52.0)
Hemoglobin: 9.2 g/dL — ABNORMAL LOW (ref 13.0–17.0)
MCH: 30.1 pg (ref 26.0–34.0)
MCHC: 33 g/dL (ref 30.0–36.0)
MCV: 91.2 fL (ref 80.0–100.0)
Platelets: 156 10*3/uL (ref 150–400)
RBC: 3.06 MIL/uL — ABNORMAL LOW (ref 4.22–5.81)
RDW: 13.1 % (ref 11.5–15.5)
WBC: 5.4 10*3/uL (ref 4.0–10.5)
nRBC: 0 % (ref 0.0–0.2)

## 2019-08-19 LAB — GLUCOSE, CAPILLARY
Glucose-Capillary: 89 mg/dL (ref 70–99)
Glucose-Capillary: 158 mg/dL — ABNORMAL HIGH (ref 70–99)
Glucose-Capillary: 227 mg/dL — ABNORMAL HIGH (ref 70–99)
Glucose-Capillary: 229 mg/dL — ABNORMAL HIGH (ref 70–99)

## 2019-08-19 LAB — SARS CORONAVIRUS 2 (TAT 6-24 HRS): SARS Coronavirus 2: NEGATIVE

## 2019-08-19 NOTE — Plan of Care (Signed)
  Problem: Clinical Measurements: Goal: Will remain free from infection Outcome: Progressing   Problem: Clinical Measurements: Goal: Diagnostic test results will improve Outcome: Progressing   Problem: Clinical Measurements: Goal: Respiratory complications will improve Outcome: Progressing   

## 2019-08-19 NOTE — Progress Notes (Signed)
PROGRESS NOTE    Charles [name]  [id number] DOB: 1932-08-26 DOA: 08/13/2019 PCP: [name], MD    Brief Narrative:  The patient presents to the ED today after having been in the ED last Friday for the same complaints, although he states that they weren't as severe at that time. He was given a one time injection of lasix in the ED and he was sent home on torsemide. He states that his swelling has "doubled" since that time, despite taking an increased dose of the torsemide as directed by cardiology.  He denies fevers, chills, cough, wheezing, nausea, vomiting, chest pain, constipation or diarrhea. He fell once a week ago. He is feeling weak.   In the ED he is found to be saturating in the mid-nineties on 2 Liters which is his baseline oxygen requirement. Creatinine appears to be at about baseline at 1.78. BNP is 244 . This is increased only slightly over the last BNP which was drawn on 05/24/2019. He is afebrile, somewhat hypotensive with a systolic blood pressure of 103/54. Pulse has varied between 71 and 140 as recorded (?). His glucose is 251. EKG demonstrates a ventricularly paced rhythm.   [facility] have been consulted to admit the patient for further evaluation and care. Cardiology [facility]) has been consulted by the ED. Echocardiogram has been ordered.  Patient started on IV Lasix.  Cardiology following.    Assessment & Plan:   Principal Problem:   Acute on chronic heart failure with preserved ejection fraction (HFpEF) (HCC) Active Problems:   Diabetes mellitus type 2, uncontrolled, with complications ([place])   HYPERCHOLESTEROLEMIA   MDD (major depressive disorder), recurrent episode, moderate (HCC)   Essential hypertension   GERD   Carotid arterial disease (HCC)   Chronic radicular pain of lower back   Chronic anticoagulation   Acute on chronic diastolic CHF (congestive heart failure) (HCC)   Coronary artery disease involving native coronary artery of native  heart without angina pectoris   Paroxysmal atrial flutter (HCC)   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  Cardiac enzymes minimally elevated however seem to plateaued.  BNP was 244.  Patient denies any ongoing chest pain.  Patient then states improvement with shortness of breath and symptoms.  2D echo with a EF of 60 to 65%, mildly increased LVH, elevated left atrial pressure, grade 2 diastolic dysfunction, mildly reduced right ventricular systolic function.  Patient with a urine output of 0.9 L over the past 24 hours.  Patient is -13.570 L during this hospitalization.  Current weight of 233.9 pounds from 223.8 pounds from 229.2 pounds from 228 pounds from 234 pounds from 232 pounds from 229 pounds.  Creatinine seems to be plateauing.  IV Lasix has been discontinued per cardiology and patient transition to torsemide to be started today.  Per cardiology patient was placed on REDS VEST(08/16/2019) which was less than 36%..  Due to borderline blood pressure cardiology recommending to hold off on beta-blocker for now.  Not on spironolactone secondary to chronic kidney disease.  Cardiology was following and signed off yesterday 08/18/2019.  Will need outpatient follow-up with cardiology.  2.  Hypotension Resolved.  Monitor closely with diuresis.  3.  COPD Continue Pulmicort and scheduled duo nebs stable.  Will likely need Spiriva and Symbicort on discharge.   4.  Poorly controlled  diabetes mellitus type 2 Hemoglobin A1c noted to be 11.0 02/23/2019.  CBG of 89 this morning.  Patient noted to have CBG in the 200s yesterday.  Continue current dose of Lantus 22 units twice daily.  Sliding scale insulin. Follow.   5.  History of paroxysmal atrial flutter Ventricularly paced.  Continue amiodarone.  Eliquis for  anticoagulation.  Per cardiology.   6.  Bilateral renal artery stenosis Patient started on an ACE inhibitor.  Renal function stable.  Continue statin.  Follow.   7.  Status post TAVR/status post PPM Pacemaker interrogated per cardiology.  Small leak noted on echocardiogram from TAVR.  Per cardiology.   DVT prophylaxis: Eliquis Code Status: Full Family Communication: Updated patient.  No family at bedside. Disposition Plan: SNF with palliative care following hopefully tomorrow.     Consultants:   Palliative care: Asencion Gowda, NP 08/14/2019  Cardiology: Dr. Harrell Gave End 08/14/2019  Procedures:   2D echo 08/14/2019  Chest x-ray 08/13/2019  Antimicrobials:   None   Subjective: Patient sitting up in chair.  Feels breathing is improving.  Denies any chest pain.  No abdominal pain.  Stated she has had a Covid test done this morning.   Objective: Vitals:   08/18/19 2048 08/19/19 0340 08/19/19 0744 08/19/19 0800  BP: (!) 126/59 (!) 126/56 112/65   Pulse: 79 72 73 74  Resp: 20 20 18 16   Temp: 97.7 F (36.5 C) 97.6 F (36.4 C) 98.5 F (36.9 C)   TempSrc: Oral Oral Oral   SpO2: 94% 97% 95% 94%  Weight:  106.1 kg    Height:        Intake/Output Summary (Last 24 hours) at 08/19/2019 1123 Last data filed at 08/19/2019 0912 Gross per 24 hour  Intake -  Output 3500 ml  Net -3500 ml   Filed Weights   08/18/19 0537 08/18/19 1347 08/19/19 0340  Weight: 103.1 kg 101.5 kg 106.1 kg    Examination:  General exam: NAD. Respiratory system: Decreased breath sounds in the bases.  Decreased coarse breath sounds.  Normal respiratory effort.  Speaking in full sentences. Cardiovascular system: RRR no murmurs rubs or gallops.  Regular rate and rhythm no murmurs rubs or gallops.  Trace bilateral lower extremity edema.  Gastrointestinal system: Abdomen is soft, nontender, nondistended, positive bowel sounds.  No rebound.  No guarding. Central nervous system: Alert and  oriented. No focal neurological deficits. Extremities: Symmetric 5 x 5 power. Skin: No rashes, lesions or ulcers Psychiatry: Judgement and insight appear normal. Mood & affect appropriate.     Data Reviewed: I have personally reviewed following labs and imaging studies  CBC: Recent Labs  Lab 08/13/19 1232 08/14/19 0539 08/16/19 0514 08/19/19 0502  WBC 5.8 6.1 6.1 5.4  NEUTROABS 4.1  --   --   --   HGB 9.9* 9.4* 9.3* 9.2*  HCT 31.4* 29.3* 30.0* 27.9*  MCV 94.0 92.1 95.5 91.2  PLT 168 157 152 [id number]   Basic Metabolic Panel: Recent Labs  Lab 08/15/19 0432 08/16/19 0514 08/17/19 0534 08/18/19 0506 08/19/19 0502  NA 139 138 139 139 138  K 3.9 4.1 3.8 3.9 3.9  CL 98 98 98 97* 97*  CO2 32 33* 32 33* 32  GLUCOSE 103* 124* 104* 116* 89  BUN 36* 37* 38* 46* 42*  CREATININE 1.57* 1.59* 1.67* 1.79* 1.62*  CALCIUM 8.9 8.8* 8.7* 8.8* 8.8*   GFR: Estimated Creatinine Clearance: 43.4 mL/min (A) (by C-G formula based on SCr of 1.62 mg/dL (H)).  Liver Function Tests: Recent Labs  Lab 08/13/19 1232  AST 18  ALT 16  ALKPHOS 81  BILITOT 0.9  PROT 7.2  ALBUMIN 3.7   No results for input(s): LIPASE, AMYLASE in the last 168 hours. No results for input(s): AMMONIA in the last 168 hours. Coagulation Profile: No results for input(s): INR, PROTIME in the last 168 hours. Cardiac Enzymes: No results for input(s): CKTOTAL, CKMB, CKMBINDEX, TROPONINI in the last 168 hours. BNP (last 3 results) Recent Labs    01/26/19 0000  PROBNP 1,162*   HbA1C: No results for input(s): HGBA1C in the last 72 hours. CBG: Recent Labs  Lab 08/18/19 0751 08/18/19 1128 08/18/19 1710 08/18/19 2102 08/19/19 0746  GLUCAP 96 210* 230* 291* 89   Lipid Profile: No results for input(s): CHOL, HDL, LDLCALC, TRIG, CHOLHDL, LDLDIRECT in the last 72 hours. Thyroid Function Tests: No results for input(s): TSH, T4TOTAL, FREET4, T3FREE, THYROIDAB in the last 72 hours. Anemia Panel: No results for input(s):  VITAMINB12, FOLATE, FERRITIN, TIBC, IRON, RETICCTPCT in the last 72 hours. Sepsis Labs: No results for input(s): PROCALCITON, LATICACIDVEN in the last 168 hours.  Recent Results (from the past 240 hour(s))  SARS CORONAVIRUS 2 (TAT 6-24 HRS) Nasopharyngeal Nasopharyngeal Swab     Status: None   Collection Time: 08/10/19 10:25 AM   Specimen: Nasopharyngeal Swab  Result Value Ref Range Status   SARS Coronavirus 2 NEGATIVE NEGATIVE Final    Comment: (NOTE) SARS-CoV-2 target nucleic acids are NOT DETECTED. The SARS-CoV-2 RNA is generally detectable in upper and lower respiratory specimens during the acute phase of infection. Negative results do not preclude SARS-CoV-2 infection, do not rule out co-infections with other pathogens, and should not be used as the sole basis for treatment or other patient management decisions. Negative results must be combined with clinical observations, patient history, and epidemiological information. The expected result is Negative. Fact Sheet for Patients: SugarRoll.be Fact Sheet for Healthcare Providers: https://www.woods-mathews.com/ This test is not yet approved or cleared by the [place] FDA and  has been authorized for detection and/or diagnosis of SARS-CoV-2 by FDA under an Emergency Use Authorization (EUA). This EUA will remain  in effect (meaning this test can be used) for the duration of the COVID-19 declaration under Section 56 4(b)(1) of the Act, 21 U.S.C. section 360bbb-3(b)(1), unless the authorization is terminated or revoked sooner. Performed at [facility], [place] [address], [place], [place] [address]   SARS CORONAVIRUS 2 (TAT 6-24 HRS) Nasopharyngeal Nasopharyngeal Swab     Status: None   Collection Time: 08/13/19  5:07 PM   Specimen: Nasopharyngeal Swab  Result Value Ref Range Status   SARS Coronavirus 2 NEGATIVE NEGATIVE Final    Comment: (NOTE) SARS-CoV-2 target nucleic acids are  NOT DETECTED. The SARS-CoV-2 RNA is generally detectable in upper and lower respiratory specimens during the acute phase of infection. Negative results do not preclude SARS-CoV-2 infection, do not rule out co-infections with other pathogens, and should not be used as the sole basis for treatment or other patient management decisions. Negative results must be combined with clinical observations, patient history, and epidemiological information. The expected result is Negative. Fact Sheet for Patients: SugarRoll.be Fact Sheet for Healthcare Providers: https://www.woods-mathews.com/ This test is not yet approved or cleared by the [place] FDA and  has been authorized for detection and/or diagnosis of SARS-CoV-2 by FDA under an Emergency Use Authorization (EUA). This EUA will remain  in effect (meaning this test can be used) for the  duration of the COVID-19 declaration under Section 56 4(b)(1) of the Act, 21 U.S.C. section 360bbb-3(b)(1), unless the authorization is terminated or revoked sooner. Performed at [facility], [place] [address], [place], [place] [address]          Radiology Studies: No results found.      Scheduled Meds: . amiodarone  200 mg Oral Daily  . apixaban  2.5 mg Oral BID  . atorvastatin  20 mg Oral Daily  . budesonide (PULMICORT) nebulizer solution  0.25 mg Nebulization BID  . citalopram  20 mg Oral Daily  . finasteride  5 mg Oral Daily  . insulin aspart  0-5 Units Subcutaneous QHS  . insulin aspart  0-9 Units Subcutaneous TID WC  . insulin glargine  22 Units Subcutaneous BID  . ipratropium-albuterol  3 mL Nebulization TID  . lisinopril  10 mg Oral Daily  . mouth rinse  15 mL Mouth Rinse BID  . pantoprazole  40 mg Oral Daily  . sodium chloride flush  3 mL Intravenous Q12H  . sodium chloride flush  3 mL Intravenous Q12H  . tamsulosin  0.4 mg Oral QPC supper  . torsemide  60 mg Oral Daily  .  traZODone  50 mg Oral QHS  . vitamin C  500 mg Oral Daily   Continuous Infusions: . sodium chloride       LOS: 6 days    Time spent: 35 minutes    Irine Seal, MD [facility]  If 7PM-7AM, please contact night-coverage [email or web address] 08/19/2019, 11:23 AM

## 2019-08-20 DIAGNOSIS — D126 Benign neoplasm of colon, unspecified: Secondary | ICD-10-CM | POA: Diagnosis not present

## 2019-08-20 DIAGNOSIS — Z79899 Other long term (current) drug therapy: Secondary | ICD-10-CM | POA: Diagnosis not present

## 2019-08-20 DIAGNOSIS — J9610 Chronic respiratory failure, unspecified whether with hypoxia or hypercapnia: Secondary | ICD-10-CM | POA: Diagnosis not present

## 2019-08-20 DIAGNOSIS — R0989 Other specified symptoms and signs involving the circulatory and respiratory systems: Secondary | ICD-10-CM | POA: Diagnosis not present

## 2019-08-20 DIAGNOSIS — R05 Cough: Secondary | ICD-10-CM | POA: Diagnosis not present

## 2019-08-20 DIAGNOSIS — J449 Chronic obstructive pulmonary disease, unspecified: Secondary | ICD-10-CM | POA: Diagnosis not present

## 2019-08-20 DIAGNOSIS — F329 Major depressive disorder, single episode, unspecified: Secondary | ICD-10-CM | POA: Diagnosis present

## 2019-08-20 DIAGNOSIS — J1289 Other viral pneumonia: Secondary | ICD-10-CM | POA: Diagnosis not present

## 2019-08-20 DIAGNOSIS — Z743 Need for continuous supervision: Secondary | ICD-10-CM | POA: Diagnosis not present

## 2019-08-20 DIAGNOSIS — N1832 Chronic kidney disease, stage 3b: Secondary | ICD-10-CM | POA: Diagnosis not present

## 2019-08-20 DIAGNOSIS — R079 Chest pain, unspecified: Secondary | ICD-10-CM | POA: Diagnosis not present

## 2019-08-20 DIAGNOSIS — Z7901 Long term (current) use of anticoagulants: Secondary | ICD-10-CM | POA: Diagnosis not present

## 2019-08-20 DIAGNOSIS — R2681 Unsteadiness on feet: Secondary | ICD-10-CM | POA: Diagnosis not present

## 2019-08-20 DIAGNOSIS — E1165 Type 2 diabetes mellitus with hyperglycemia: Secondary | ICD-10-CM | POA: Diagnosis not present

## 2019-08-20 DIAGNOSIS — Z951 Presence of aortocoronary bypass graft: Secondary | ICD-10-CM | POA: Diagnosis not present

## 2019-08-20 DIAGNOSIS — M6281 Muscle weakness (generalized): Secondary | ICD-10-CM | POA: Diagnosis not present

## 2019-08-20 DIAGNOSIS — I701 Atherosclerosis of renal artery: Secondary | ICD-10-CM | POA: Diagnosis not present

## 2019-08-20 DIAGNOSIS — E785 Hyperlipidemia, unspecified: Secondary | ICD-10-CM | POA: Diagnosis not present

## 2019-08-20 DIAGNOSIS — I130 Hypertensive heart and chronic kidney disease with heart failure and stage 1 through stage 4 chronic kidney disease, or unspecified chronic kidney disease: Secondary | ICD-10-CM | POA: Diagnosis not present

## 2019-08-20 DIAGNOSIS — J9621 Acute and chronic respiratory failure with hypoxia: Secondary | ICD-10-CM | POA: Diagnosis not present

## 2019-08-20 DIAGNOSIS — M1990 Unspecified osteoarthritis, unspecified site: Secondary | ICD-10-CM | POA: Diagnosis not present

## 2019-08-20 DIAGNOSIS — I700 Atherosclerosis of aorta: Secondary | ICD-10-CM | POA: Diagnosis not present

## 2019-08-20 DIAGNOSIS — Z794 Long term (current) use of insulin: Secondary | ICD-10-CM | POA: Diagnosis not present

## 2019-08-20 DIAGNOSIS — I5032 Chronic diastolic (congestive) heart failure: Secondary | ICD-10-CM | POA: Diagnosis not present

## 2019-08-20 DIAGNOSIS — J9601 Acute respiratory failure with hypoxia: Secondary | ICD-10-CM | POA: Diagnosis not present

## 2019-08-20 DIAGNOSIS — M5136 Other intervertebral disc degeneration, lumbar region: Secondary | ICD-10-CM | POA: Diagnosis not present

## 2019-08-20 DIAGNOSIS — E1122 Type 2 diabetes mellitus with diabetic chronic kidney disease: Secondary | ICD-10-CM | POA: Diagnosis not present

## 2019-08-20 DIAGNOSIS — E7800 Pure hypercholesterolemia, unspecified: Secondary | ICD-10-CM

## 2019-08-20 DIAGNOSIS — J189 Pneumonia, unspecified organism: Secondary | ICD-10-CM | POA: Diagnosis not present

## 2019-08-20 DIAGNOSIS — E118 Type 2 diabetes mellitus with unspecified complications: Secondary | ICD-10-CM | POA: Diagnosis not present

## 2019-08-20 DIAGNOSIS — Z952 Presence of prosthetic heart valve: Secondary | ICD-10-CM | POA: Diagnosis not present

## 2019-08-20 DIAGNOSIS — Z953 Presence of xenogenic heart valve: Secondary | ICD-10-CM | POA: Diagnosis not present

## 2019-08-20 DIAGNOSIS — I5042 Chronic combined systolic (congestive) and diastolic (congestive) heart failure: Secondary | ICD-10-CM | POA: Diagnosis not present

## 2019-08-20 DIAGNOSIS — R0902 Hypoxemia: Secondary | ICD-10-CM | POA: Diagnosis not present

## 2019-08-20 DIAGNOSIS — D649 Anemia, unspecified: Secondary | ICD-10-CM | POA: Diagnosis not present

## 2019-08-20 DIAGNOSIS — I10 Essential (primary) hypertension: Secondary | ICD-10-CM | POA: Diagnosis not present

## 2019-08-20 DIAGNOSIS — Z66 Do not resuscitate: Secondary | ICD-10-CM | POA: Diagnosis not present

## 2019-08-20 DIAGNOSIS — Z7401 Bed confinement status: Secondary | ICD-10-CM | POA: Diagnosis not present

## 2019-08-20 DIAGNOSIS — I714 Abdominal aortic aneurysm, without rupture: Secondary | ICD-10-CM | POA: Diagnosis not present

## 2019-08-20 DIAGNOSIS — Z9981 Dependence on supplemental oxygen: Secondary | ICD-10-CM | POA: Diagnosis not present

## 2019-08-20 DIAGNOSIS — R262 Difficulty in walking, not elsewhere classified: Secondary | ICD-10-CM | POA: Diagnosis not present

## 2019-08-20 DIAGNOSIS — J440 Chronic obstructive pulmonary disease with acute lower respiratory infection: Secondary | ICD-10-CM | POA: Diagnosis not present

## 2019-08-20 DIAGNOSIS — I4892 Unspecified atrial flutter: Secondary | ICD-10-CM | POA: Diagnosis not present

## 2019-08-20 DIAGNOSIS — N182 Chronic kidney disease, stage 2 (mild): Secondary | ICD-10-CM | POA: Diagnosis not present

## 2019-08-20 DIAGNOSIS — R0602 Shortness of breath: Secondary | ICD-10-CM | POA: Diagnosis not present

## 2019-08-20 DIAGNOSIS — N179 Acute kidney failure, unspecified: Secondary | ICD-10-CM | POA: Diagnosis not present

## 2019-08-20 DIAGNOSIS — G8929 Other chronic pain: Secondary | ICD-10-CM | POA: Diagnosis not present

## 2019-08-20 DIAGNOSIS — K219 Gastro-esophageal reflux disease without esophagitis: Secondary | ICD-10-CM

## 2019-08-20 DIAGNOSIS — I2510 Atherosclerotic heart disease of native coronary artery without angina pectoris: Secondary | ICD-10-CM | POA: Diagnosis not present

## 2019-08-20 DIAGNOSIS — M2550 Pain in unspecified joint: Secondary | ICD-10-CM | POA: Diagnosis not present

## 2019-08-20 DIAGNOSIS — F331 Major depressive disorder, recurrent, moderate: Secondary | ICD-10-CM

## 2019-08-20 DIAGNOSIS — I5033 Acute on chronic diastolic (congestive) heart failure: Secondary | ICD-10-CM | POA: Diagnosis not present

## 2019-08-20 DIAGNOSIS — R531 Weakness: Secondary | ICD-10-CM | POA: Diagnosis not present

## 2019-08-20 DIAGNOSIS — Z86010 Personal history of colonic polyps: Secondary | ICD-10-CM | POA: Diagnosis not present

## 2019-08-20 DIAGNOSIS — R509 Fever, unspecified: Secondary | ICD-10-CM | POA: Diagnosis not present

## 2019-08-20 DIAGNOSIS — I5023 Acute on chronic systolic (congestive) heart failure: Secondary | ICD-10-CM | POA: Diagnosis not present

## 2019-08-20 DIAGNOSIS — R7989 Other specified abnormal findings of blood chemistry: Secondary | ICD-10-CM | POA: Diagnosis not present

## 2019-08-20 DIAGNOSIS — T380X5A Adverse effect of glucocorticoids and synthetic analogues, initial encounter: Secondary | ICD-10-CM | POA: Diagnosis not present

## 2019-08-20 DIAGNOSIS — U071 COVID-19: Secondary | ICD-10-CM | POA: Diagnosis not present

## 2019-08-20 DIAGNOSIS — Z950 Presence of cardiac pacemaker: Secondary | ICD-10-CM | POA: Diagnosis not present

## 2019-08-20 DIAGNOSIS — I272 Pulmonary hypertension, unspecified: Secondary | ICD-10-CM | POA: Diagnosis not present

## 2019-08-20 LAB — BASIC METABOLIC PANEL
Anion gap: 9 (ref 5–15)
BUN: 44 mg/dL — ABNORMAL HIGH (ref 8–23)
CO2: 32 mmol/L (ref 22–32)
Calcium: 8.9 mg/dL (ref 8.9–10.3)
Chloride: 99 mmol/L (ref 98–111)
Creatinine, Ser: 1.5 mg/dL — ABNORMAL HIGH (ref 0.61–1.24)
GFR calc Af Amer: 49 mL/min — ABNORMAL LOW (ref >60)
GFR calc non Af Amer: 42 mL/min — ABNORMAL LOW (ref >60)
Glucose, Bld: 64 mg/dL — ABNORMAL LOW (ref 70–99)
Potassium: 3.9 mmol/L (ref 3.5–5.1)
Sodium: 140 mmol/L (ref 135–145)

## 2019-08-20 LAB — GLUCOSE, CAPILLARY
Glucose-Capillary: 62 mg/dL — ABNORMAL LOW (ref 70–99)
Glucose-Capillary: 133 mg/dL — ABNORMAL HIGH (ref 70–99)
Glucose-Capillary: 199 mg/dL — ABNORMAL HIGH (ref 70–99)

## 2019-08-20 MED ORDER — IPRATROPIUM-ALBUTEROL 0.5-2.5 (3) MG/3ML IN SOLN: 3.0000 mL | Freq: Two times a day (BID) | RESPIRATORY_TRACT | 1 refills | Status: DC

## 2019-08-20 MED ORDER — TRAZODONE HCL 50 MG PO TABS: 50.0000 mg | ORAL_TABLET | Freq: Every day | ORAL | 0 refills | Status: AC

## 2019-08-20 MED ORDER — LISINOPRIL 10 MG PO TABS: 10.0000 mg | ORAL_TABLET | Freq: Every day | ORAL | 0 refills | Status: DC

## 2019-08-20 MED ORDER — BUDESONIDE-FORMOTEROL FUMARATE 160-4.5 MCG/ACT IN AERO: 2.0000 | INHALATION_SPRAY | Freq: Two times a day (BID) | RESPIRATORY_TRACT | 2 refills | Status: AC

## 2019-08-20 MED ORDER — IPRATROPIUM-ALBUTEROL 0.5-2.5 (3) MG/3ML IN SOLN: 3.0000 mL | Freq: Two times a day (BID) | RESPIRATORY_TRACT | Status: DC

## 2019-08-20 MED ORDER — AMIODARONE HCL 200 MG PO TABS: 200.0000 mg | ORAL_TABLET | Freq: Every day | ORAL | 1 refills | Status: AC

## 2019-08-20 MED ORDER — INSULIN GLARGINE 100 UNIT/ML ~~LOC~~ SOLN: SUBCUTANEOUS | 0 refills | Status: DC

## 2019-08-20 MED ORDER — PANTOPRAZOLE SODIUM 40 MG PO TBEC: 40.0000 mg | DELAYED_RELEASE_TABLET | Freq: Every day | ORAL | 1 refills | Status: AC

## 2019-08-20 NOTE — Discharge Summary (Signed)
Physician Discharge Summary  Charles [name] [id number] DOB: [birth year] DOA: 08/13/2019  PCP: Charles Bush, MD  Admit date: 08/13/2019 Discharge date: 08/20/2019  Time spent: 60 minutes  Recommendations for Outpatient Follow-up:  1. Follow-up with Dr. Gwenlyn [name], cardiology in 2 weeks.  On follow-up patient will need a basic metabolic profile done to follow-up on electrolytes and renal function. 2. Follow-up with MD at skilled nursing facility.  Patient will need a basic metabolic profile done in 1 week to follow-up on electrolytes and renal function.  Patient will need palliative care to follow at facility. 3. Follow-up with Charles Bush, MD after discharge from skilled nursing facility.  Patient may benefit from outpatient referral to pulmonary for further management of COPD.    Discharge Diagnoses:  Principal Problem:   Acute on chronic heart failure with preserved ejection fraction (HFpEF) (HCC) Active Problems:   Diabetes mellitus type 2, uncontrolled, with complications (Charles [name])   HYPERCHOLESTEROLEMIA   MDD (major depressive disorder), recurrent episode, moderate (HCC)   Essential hypertension   GERD   Carotid arterial disease (HCC)   Chronic radicular pain of lower back   Chronic anticoagulation   Acute on chronic diastolic CHF (congestive heart failure) (HCC)   Coronary artery disease involving native coronary artery of native heart without angina pectoris   Paroxysmal atrial flutter (HCC)   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 [employer]   S/P TAVR (transcatheter aortic valve replacement)   CKD (chronic kidney disease), stage II   Discharge Condition: Stable and improved  Diet recommendation: Heart healthy  Filed Weights   08/18/19 1347 08/19/19 0340 08/20/19 0336  Weight: 101.5 kg 106.1 kg 101.5 kg    History of present illness:  Per Dr Benny Lennert The patient is a 85 yr old man who carries a past medial history significant for CHF, AAA, Atrial flutter,  CAD,Carotid stenosis, Chronic diastolic CHF, COPD, L1 Compression fracture, GERD, Depression, Hyperlipidemia, hypertension, Pacemaker, S/P TAVR for severe aortic stenosis, DM II, AB Block Mobitz 1.  The patient presented to the ED today after having been in the ED last Friday for the same complaints, although he stated that they weren't as severe at that time. He was given a one time injection of lasix in the ED and he was sent home on torsemide. He stated that his swelling has "doubled" since that time, despite taking an increased dose of the torsemide as directed by cardiology.  He denied fevers, chills, cough, wheezing, nausea, vomiting, chest pain, constipation or diarrhea. He fell once a week ago. He was feeling weak.   In the ED he was [name] to be saturating in the mid-nineties on 2 Liters which is his baseline oxygen requirement. Creatinine appearred to be at about baseline at 1.78. BNP is 244 . This was increased only slightly over the last BNP which was drawn on 05/24/2019. He is afebrile, somewhat hypotensive with a systolic blood pressure of 103/54. Pulse has varied between 71 and 140 as recorded (?). His glucose is 251. EKG demonstrates a ventricularly paced rhythm.   [facility] have been consulted to admit the patient for further evaluation and care. Cardiology [facility] has been consulted by the ED. Echocardiogram has been ordered. He has received 80 mg lasix IV. He will be admitted to a telemetry bed.  Hospital Course:  1 acute on chronic combined systolic and diastolic heart failure Questionable etiology.  Cardiac enzymes minimally elevated however seem to plateaued.  BNP was 244.  Patient denied any ongoing chest pain.  Cardiology consulted will follow the patient throughout the hospitalization. 2D echo with a EF of 60 to 65%, mildly increased LVH, elevated left atrial pressure, grade 2 diastolic dysfunction, mildly reduced right ventricular systolic function.    Patient was  placed on IV Lasix with good urine output.  Patient was -13.090 L during this hospitalization.  His weight on day of discharge was 223.7 pounds from 233.9 pounds from 223.8 pounds from 229.2 pounds from 228 pounds from 234 pounds from 232 pounds from 229 pounds.  Creatinine stabilized.  As patient improved and was euvolemic IV Lasix was subsequently transition back to home dose oral torsemide. Per cardiology patient was placed on REDS VEST(08/16/2019) which was less than 36%..  Due to borderline blood pressure cardiology recommending to hold off on beta-blocker for now.  Not on spironolactone secondary to chronic kidney disease.  Patient's lisinopril was decreased to 10 mg daily.  Patient will be discharged to skilled nursing facility in stable and improved condition.  Outpatient follow-up with primary cardiologist, Dr. Gwenlyn [name] in 2 weeks.   2.  Hypotension Resolved.   3.  COPD Patient was placed on Pulmicort as well as scheduled duo nebs.  Patient remained stable.  Patient will be discharged on Symbicort and scheduled duo nebs twice daily.  Patient will need to follow-up with pulmonary in the outpatient setting after discharge from skilled nursing facility.    4.  Poorly controlled diabetes mellitus type 2 Hemoglobin A1c noted to be 11.0 02/23/2019.    Patient's Lantus dose was adjusted during the hospitalization for better blood glucose control.  Patient be discharged on Lantus 22 units in the morning and 20 units at bedtime.  Patient was also maintained on sliding scale insulin during the hospitalization.  Due to patient's renal function patient's metformin has been discontinued.  Outpatient follow-up with PCP post discharge from skilled nursing facility.   5.  History of paroxysmal atrial flutter Ventricularly paced.  Patient maintained on amiodarone and dose decreased down to 200 mg daily per cardiology.  Patient also maintained on Eliquis for anticoagulation.  Outpatient follow-up with cardiology.    6.  Bilateral renal artery stenosis Patient started on an ACE inhibitor.    Renal function remained stable.  Patient maintained on statin.  Outpatient follow-up.   7.  Status post TAVR/status post PPM Pacemaker interrogated per cardiology.  Small leak noted on echocardiogram from TAVR.    Remained stable.  Outpatient follow-up with cardiology.    Procedures:  2D echo 08/14/2019  Chest x-ray 08/13/2019  Consultations:  Palliative care: Asencion Gowda, NP 08/14/2019  Cardiology: Dr. Harrell Gave End 08/14/2019  Discharge Exam: Vitals:   08/20/19 0758 08/20/19 0823  BP: (!) 133/108 (!) 105/52  Pulse: (!) 190 70  Resp:    Temp:    SpO2:  94%    General: NAD Cardiovascular: RRR Respiratory: CTAB  Discharge Instructions   Discharge Instructions    Diet - low sodium heart healthy   Complete by: As directed    Increase activity slowly   Complete by: As directed      Allergies as of 08/20/2019      Reactions   Gabapentin Other (See Comments)   Leg pain, weakness   Metoprolol Swelling   Spironolactone Other (See Comments)   Painful gynecomastia   Amlodipine Other (See Comments)   Edema   Other Other (See Comments)   Horse serum - myalgias   Rosiglitazone Maleate Other (See Comments)    Did not help  Tricor [fenofibrate] Other (See Comments)   myalgias      Medication List    STOP taking these medications   metFORMIN 500 MG tablet Commonly known as: GLUCOPHAGE     TAKE these medications   Accu-Chek Aviva Plus test strip Generic drug: glucose blood Use as instructed to check blood sugar three times a day. Dx code E11.8 & E11.65   acetaminophen 500 MG tablet Commonly known as: TYLENOL Take 1 tablet (500 mg total) by mouth 3 (three) times daily. What changed:   how much to take  when to take this  reasons to take this   amiodarone 200 MG tablet Commonly known as: PACERONE Take 1 tablet (200 mg total) by mouth daily. Start taking on:  August 21, 2019 What changed:   how much to take  how to take this  when to take this  additional instructions   apixaban 2.5 MG Tabs tablet Commonly known as: ELIQUIS Take 1 tablet (2.5 mg total) by mouth 2 (two) times daily.   atorvastatin 20 MG tablet Commonly known as: LIPITOR Take 1 tablet (20 mg total) by mouth daily.   BD Insulin Syringe U/F 30G X 1/2" 0.5 ML Misc Generic drug: Insulin Syringe-Needle U-100 AS DIRECTED.   budesonide-formoterol 160-4.5 MCG/ACT inhaler Commonly known as: Symbicort Inhale 2 puffs into the lungs 2 (two) times daily.   citalopram 20 MG tablet Commonly known as: CELEXA Take 1 tablet (20 mg total) by mouth daily.   finasteride 5 MG tablet Commonly known as: PROSCAR Take 1 tablet (5 mg total) by mouth daily.   guaiFENesin 600 MG 12 hr tablet Commonly known as: MUCINEX Take 600 mg by mouth 2 (two) times daily as needed for cough or to loosen phlegm.   insulin glargine 100 UNIT/ML injection Commonly known as: Lantus Lantus 22 units in the morning and 20 units at bedtime. What changed:   how much to take  how to take this  when to take this  additional instructions   ipratropium-albuterol 0.5-2.5 (3) MG/3ML Soln Commonly known as: DUONEB Take 3 mLs by nebulization 2 (two) times daily.   lisinopril 10 MG tablet Commonly known as: ZESTRIL Take 1 tablet (10 mg total) by mouth daily. What changed:   medication strength  how much to take   nitroGLYCERIN 0.4 MG SL tablet Commonly known as: NITROSTAT Place 1 tablet (0.4 mg total) under the tongue every 5 (five) minutes as needed for chest pain.   pantoprazole 40 MG tablet Commonly known as: PROTONIX Take 1 tablet (40 mg total) by mouth daily. Take 1 tablet (20 mg total) by mouth daily. What changed:   medication strength  how much to take  how to take this  when to take this   potassium chloride SA 20 MEQ tablet Commonly known as: KLOR-CON Take 1 tablet (20  mEq total) every Monday, Wednesday, Friday, Saturday, and Sunday. What changed:   how much to take  how to take this  when to take this   tamsulosin 0.4 MG Caps capsule Commonly known as: FLOMAX Take 1 capsule (0.4 mg total) by mouth daily after supper.   torsemide 20 MG tablet Commonly known as: DEMADEX Take 3 tablets (60 mg total) by mouth daily.   traZODone 50 MG tablet Commonly known as: DESYREL Take 1 tablet (50 mg total) by mouth at bedtime. What changed: See the new instructions.   VITAMIN C PO Take 1 tablet by mouth daily.  Durable Medical Equipment  (From admission, onward)         Start     Ordered   08/16/19 [phone]  For home use only DME lightweight manual wheelchair with seat cushion  Once    Comments: Patient suffers from CHF which impairs their ability to perform daily activities in the home.  A walking aid will not resolve  issue with performing activities of daily living. A wheelchair will allow patient to safely perform daily activities. Patient is not able to propel themselves in the home using a standard weight wheelchair due to weakness. Patient can self propel in the lightweight wheelchair. Length of need indefinite. Accessories: elevating leg rests (ELRs), wheel locks, extensions and anti-tippers.   08/16/19 0813   08/16/19 0811  For home use only DME 4 wheeled rolling walker with seat  Once    Question:  Patient needs a walker to treat with the following condition  Answer:  Debility   08/16/19 0811   08/16/19 0808  For home use only DME 3 n 1  Once     08/16/19 0807         Allergies  Allergen Reactions  . Gabapentin Other (See Comments)    Leg pain, weakness   . Metoprolol Swelling  . Spironolactone Other (See Comments)    Painful gynecomastia  . Amlodipine Other (See Comments)    Edema  . Other Other (See Comments)    Horse serum - myalgias  . Rosiglitazone Maleate Other (See Comments)     Did not help  . Tricor  [Fenofibrate] Other (See Comments)    myalgias    Contact information for follow-up providers    [name], MD. Schedule an appointment as soon as possible for a visit in 2 week(s).   Specialties: Cardiology, Radiology Contact information: [address] [place] [address] [phone]        Palliative Care Follow up.        Charles Bush, MD. Schedule an appointment as soon as possible for a visit in 2 week(s).   Specialty: Family Medicine Why: Follow-up after discharge from skilled nursing facility Contact information: [place] [address] [phone]            Contact information for after-discharge care    Destination    [facility] Preferred [facility]F .   Service: Skilled Nursing Contact information: [address] [place] [phone]                   The results of significant diagnostics from this hospitalization (including imaging, microbiology, ancillary and laboratory) are listed below for reference.    Significant Diagnostic Studies: Dg Chest 1 View  Result Date: 08/10/2019 CLINICAL DATA:  Weakness.  Fall.  Left hip pain. EXAM: CHEST  1 VIEW COMPARISON:  05/24/2019 FINDINGS: Previous median sternotomy CABG procedure. There is a left chest wall pacer device with leads in the right atrial appendage and right ventricle. Mild cardiac enlargement. Decreased lung volumes with coarsened interstitial markings noted bilaterally. Chronic atelectasis or scar identified in the left base. IMPRESSION: 1. Chronic low lung volumes with asymmetric elevation of the left hemidiaphragm and left base atelectasis versus scar. . Electronically Signed   By: Kerby Moors M.D.   On: 08/10/2019 10:58   Dg Chest 2 View  Result Date: 08/13/2019 CLINICAL DATA:  Shortness of breath. EXAM: CHEST - 2 VIEW COMPARISON:  August 10, 2019. FINDINGS: Stable  cardiomediastinal silhouette.  Atherosclerosis of thoracic aorta is noted. Left-sided pacemaker is unchanged in position. Status post coronary bypass graft and transcatheter aortic valve repair. No pneumothorax or pleural effusion is noted. Right lung is clear. Stable elevated left hemidiaphragm is noted with mild left basilar atelectasis. IMPRESSION: Stable elevated left hemidiaphragm with mild left basilar subsegmental atelectasis. Aortic Atherosclerosis (ICD10-I70.0). Electronically Signed   By: Marijo Conception M.D.   On: 08/13/2019 13:30   Ct Pelvis Wo Contrast  Result Date: 08/10/2019 CLINICAL DATA:  Pelvic trauma, weakness, fall, LEFT hip pain, multiple falls over the past week, negative radiographs EXAM: CT PELVIS WITHOUT CONTRAST TECHNIQUE: Multidetector CT imaging of the pelvis was performed following the standard protocol without intravenous contrast. COMPARISON:  LEFT hip radiographs 08/10/2019 FINDINGS: Urinary Tract:  Ladder and visualized ureters normal appearance Bowel: Diverticulosis of descending and sigmoid colon. Remaining visualized bowel loops unremarkable Vascular/Lymphatic: Atherosclerotic calcifications of the aorta and iliac arteries. Visualized aorta normal caliber. No adenopathy. Reproductive:  Prostatic calcifications. Other:  Tiny RIGHT inguinal hernia containing fat Musculoskeletal: Significant diffuse osseous demineralization. Mild BILATERAL narrowing of the hip joints. Pelvis intact. No pelvic or proximal femoral fracture or dislocation. Degenerative disc and facet disease changes at L4-L5 and L5-S1. IMPRESSION: Significant diffuse osseous demineralization without acute bony abnormalities. Distal colonic diverticulosis without evidence of diverticulitis. Tiny RIGHT inguinal hernia containing fat. No acute intrapelvic abnormalities. Electronically Signed   By: Lavonia Dana M.D.   On: 08/10/2019 12:26   Dg Hip Unilat W Or Wo Pelvis 2-3 Views Left  Result Date: 08/10/2019 CLINICAL DATA:  Weakness, fall, left  hip pain, initial encounter. EXAM: DG HIP (WITH OR WITHOUT PELVIS) 2-3V LEFT COMPARISON:  None. FINDINGS: Osteopenia. No acute osseous or joint abnormality. Mild acetabular sclerosis bilaterally. Degenerative changes are seen in the visualized lumbar spine. Vascular calcifications. IMPRESSION: 1. No acute findings. 2. Osteopenia. 3. Degenerative changes in the spine and minimally within the hips. Electronically Signed   By: Lorin Picket M.D.   On: 08/10/2019 10:53    Microbiology: Recent Results (from the past 240 hour(s))  SARS CORONAVIRUS 2 (TAT 6-24 HRS) Nasopharyngeal Nasopharyngeal Swab     Status: None   Collection Time: 08/13/19  5:07 PM   Specimen: Nasopharyngeal Swab  Result Value Ref Range Status   SARS Coronavirus 2 NEGATIVE NEGATIVE Final    Comment: (NOTE) SARS-CoV-2 target nucleic acids are NOT DETECTED. The SARS-CoV-2 RNA is generally detectable in upper and lower respiratory specimens during the acute phase of infection. Negative results do not preclude SARS-CoV-2 infection, do not rule out co-infections with other pathogens, and should not be used as the sole basis for treatment or other patient management decisions. Negative results must be combined with clinical observations, patient history, and epidemiological information. The expected result is Negative. Fact Sheet for Patients: SugarRoll.be Fact Sheet for Healthcare Providers: https://www.woods-mathews.com/ This test is not yet approved or cleared by the [place] FDA and  has been authorized for detection and/or diagnosis of SARS-CoV-2 by FDA under an Emergency Use Authorization (EUA). This EUA will remain  in effect (meaning this test can be used) for the duration of the COVID-19 declaration under Section 56 4(b)(1) of the Act, 21 U.S.C. section 360bbb-3(b)(1), unless the authorization is terminated or revoked sooner. Performed at [facility], [place]  [address] [place], [place] [address]   SARS CORONAVIRUS 2 (TAT 6-24 HRS) Nasopharyngeal Nasopharyngeal Swab     Status: None   Collection Time: 08/19/19  9:49 AM  Specimen: Nasopharyngeal Swab  Result Value Ref Range Status   SARS Coronavirus 2 NEGATIVE NEGATIVE Final    Comment: (NOTE) SARS-CoV-2 target nucleic acids are NOT DETECTED. The SARS-CoV-2 RNA is generally detectable in upper and lower respiratory specimens during the acute phase of infection. Negative results do not preclude SARS-CoV-2 infection, do not rule out co-infections with other pathogens, and should not be used as the sole basis for treatment or other patient management decisions. Negative results must be combined with clinical observations, patient history, and epidemiological information. The expected result is Negative. Fact Sheet for Patients: SugarRoll.be Fact Sheet for Healthcare Providers: https://www.woods-mathews.com/ This test is not yet approved or cleared by the [place] FDA and  has been authorized for detection and/or diagnosis of SARS-CoV-2 by FDA under an Emergency Use Authorization (EUA). This EUA will remain  in effect (meaning this test can be used) for the duration of the COVID-19 declaration under Section 56 4(b)(1) of the Act, 21 U.S.C. section 360bbb-3(b)(1), unless the authorization is terminated or revoked sooner. Performed at [facility], [place] [address] [place], [place] [address]      Labs: Basic Metabolic Panel: Recent Labs  Lab 08/16/19 0514 08/17/19 0534 08/18/19 0506 08/19/19 0502 08/20/19 0443  NA 138 139 139 138 140  K 4.1 3.8 3.9 3.9 3.9  CL 98 98 97* 97* 99  CO2 33* 32 33* 32 32  GLUCOSE 124* 104* 116* 89 64*  BUN 37* 38* 46* 42* 44*  CREATININE 1.59* 1.67* 1.79* 1.62* 1.50*  CALCIUM 8.8* 8.7* 8.8* 8.8* 8.9   Liver Function Tests: Recent Labs  Lab 08/13/19 1232  AST 18  ALT 16  ALKPHOS 81  BILITOT 0.9   PROT 7.2  ALBUMIN 3.7   No results for input(s): LIPASE, AMYLASE in the last 168 hours. No results for input(s): AMMONIA in the last 168 hours. CBC: Recent Labs  Lab 08/13/19 1232 08/14/19 0539 08/16/19 0514 08/19/19 0502  WBC 5.8 6.1 6.1 5.4  NEUTROABS 4.1  --   --   --   HGB 9.9* 9.4* 9.3* 9.2*  HCT 31.4* 29.3* 30.0* 27.9*  MCV 94.0 92.1 95.5 91.2  PLT 168 157 152 156   Cardiac Enzymes: No results for input(s): CKTOTAL, CKMB, CKMBINDEX, TROPONINI in the last 168 hours. BNP: BNP (last 3 results) Recent Labs    05/22/19 1126 05/24/19 2358 08/13/19 1235  BNP 202.4* 178.5* 244.0*    ProBNP (last 3 results) Recent Labs    01/26/19 0000  PROBNP 1,162*    CBG: Recent Labs  Lab 08/19/19 0746 08/19/19 1139 08/19/19 1714 08/19/19 2118 08/20/19 0757  GLUCAP 89 158* 227* 229* 62*       Signed:  Irine Seal MD.  [facility] 08/20/2019, 10:45 AM

## 2019-08-20 NOTE — Care Management Important Message (Signed)
Important Message  Patient Details  Name: Charles [name] MRN: [id number] Date of Birth: Jun 23, 1932   Medicare Important Message Given:  Yes     Dannette Barbara 08/20/2019, 12:18 PM

## 2019-08-20 NOTE — Progress Notes (Signed)
Patient is being discharge to rehab facility , report called to receiving nurse, patient discharge to SNF , iv removed, tele removed

## 2019-08-20 NOTE — TOC Transition Note (Signed)
Transition of Care [facility]) - CM/SW Discharge Note   Patient Details  Name: Charles [name] MRN: [id number] Date of Birth: [birth year]  [facility] of [facility]) CM/SW Contact:  Candie Chroman, LCSW Phone Number: 08/20/2019, 11:32 AM   Clinical Narrative: Patient has orders to discharge to [facility] today. RN will call report to [phone] prior to setting up EMS transport. No further concerns. CSW signing off.    Final next level of care: [place] Barriers to Discharge: Barriers Resolved   Patient Goals and CMS Choice        Discharge Placement   Existing PASRR number confirmed : 08/16/19          Patient chooses bed at: [facility] Patient to be transferred to facility by: EMS Name of family member notified: Maisie Fus Patient and family notified of of transfer: 08/20/19  Discharge Plan and Services                                     Social Determinants of Health (SDOH) Interventions     Readmission Risk Interventions Readmission Risk Prevention Plan 08/14/2019  Transportation Screening Complete  HRI or [place] Complete  Social Work Consult for [place] Planning/Counseling Complete  Palliative Care Screening Complete  Medication Review Press photographer) Complete  Some recent data might be hidden

## 2019-08-20 NOTE — Plan of Care (Signed)
  Problem: Health Behavior/Discharge Planning: Goal: Ability to manage health-related needs will improve Outcome: Progressing   Problem: Clinical Measurements: Goal: Ability to maintain clinical measurements within normal limits will improve Outcome: Progressing   Problem: Clinical Measurements: Goal: Will remain free from infection Outcome: Progressing   

## 2019-08-20 NOTE — Progress Notes (Signed)
Inpatient Diabetes Program Recommendations  AACE/ADA: New Consensus Statement on Inpatient Glycemic Control  Target Ranges:  Prepandial:   less than 140 mg/dL      Peak postprandial:   less than 180 mg/dL (1-2 hours)      Critically ill patients:  140 - 180 mg/dL   Results for Charles [name], Charles [name] (MRN [id number]) as of 08/20/2019 09:13  Ref. Range 08/19/2019 07:46 08/19/2019 11:39 08/19/2019 17:14 08/19/2019 21:18 08/20/2019 07:57  Glucose-Capillary Latest Ref Range: 70 - 99 mg/dL 89 158 (H) 227 (H) 229 (H) 62 (L)   Review of Glycemic Control  Diabetes history: DM2 Outpatient Diabetes medications: Lantus 25 units BID Current orders for Inpatient glycemic control: Lantus 22 units BID, Novolog 0-9 units TID with meals, Novolog 0-5 units QHS  Inpatient Diabetes Program Recommendations:   Insulin - Basal: Fasting glucose 62 mg/dl today. Please consider decreasing evening dose of Lantus to 20 units QHS (continue Lantus 22 units QAM).  Insulin - Meal Coverage: Please consider ordering Novolog 2 units TID with meals for meal coverage if patient eats at least 50% of meals.  Thanks, Barnie Alderman, RN, MSN, CDE Diabetes Coordinator Inpatient Diabetes Program [phone] (Team Pager from 8am to 5pm)

## 2019-08-22 ENCOUNTER — Telehealth: Payer: Self-pay | Admitting: Cardiovascular Disease

## 2019-08-22 DIAGNOSIS — J449 Chronic obstructive pulmonary disease, unspecified: Secondary | ICD-10-CM | POA: Diagnosis not present

## 2019-08-22 DIAGNOSIS — Z9981 Dependence on supplemental oxygen: Secondary | ICD-10-CM | POA: Diagnosis not present

## 2019-08-22 DIAGNOSIS — I130 Hypertensive heart and chronic kidney disease with heart failure and stage 1 through stage 4 chronic kidney disease, or unspecified chronic kidney disease: Secondary | ICD-10-CM | POA: Diagnosis not present

## 2019-08-22 DIAGNOSIS — I5033 Acute on chronic diastolic (congestive) heart failure: Secondary | ICD-10-CM | POA: Diagnosis not present

## 2019-08-22 NOTE — Telephone Encounter (Signed)
Spoke with DIL tara on DPR .  She is going to see patient tomorrow and discuss with him about doing a virtual visit.   Explained to [facility] nurse will be calling soon to touch base as well.    Awaiting a call back from her to coordinate virtual .

## 2019-08-22 NOTE — Telephone Encounter (Signed)
-----   Message from Rise Mu, [place] sent at 08/18/2019  1:07 PM EST ----- Please schedule patient for TCM follow-up with Dr. Rockey Situ in 2 weeks

## 2019-08-23 DIAGNOSIS — J449 Chronic obstructive pulmonary disease, unspecified: Secondary | ICD-10-CM | POA: Diagnosis not present

## 2019-08-23 DIAGNOSIS — I5033 Acute on chronic diastolic (congestive) heart failure: Secondary | ICD-10-CM | POA: Diagnosis not present

## 2019-08-23 DIAGNOSIS — I272 Pulmonary hypertension, unspecified: Secondary | ICD-10-CM | POA: Diagnosis not present

## 2019-08-23 DIAGNOSIS — I4892 Unspecified atrial flutter: Secondary | ICD-10-CM | POA: Diagnosis not present

## 2019-08-23 NOTE — Telephone Encounter (Signed)
I dont think the famiily is aware of the appt.

## 2019-08-29 DIAGNOSIS — N1832 Chronic kidney disease, stage 3b: Secondary | ICD-10-CM | POA: Diagnosis not present

## 2019-08-29 DIAGNOSIS — I5033 Acute on chronic diastolic (congestive) heart failure: Secondary | ICD-10-CM | POA: Diagnosis not present

## 2019-08-31 DIAGNOSIS — N1832 Chronic kidney disease, stage 3b: Secondary | ICD-10-CM | POA: Diagnosis not present

## 2019-08-31 DIAGNOSIS — I5033 Acute on chronic diastolic (congestive) heart failure: Secondary | ICD-10-CM | POA: Diagnosis not present

## 2019-08-31 DIAGNOSIS — D649 Anemia, unspecified: Secondary | ICD-10-CM | POA: Diagnosis not present

## 2019-08-31 DIAGNOSIS — I130 Hypertensive heart and chronic kidney disease with heart failure and stage 1 through stage 4 chronic kidney disease, or unspecified chronic kidney disease: Secondary | ICD-10-CM | POA: Diagnosis not present

## 2019-09-02 ENCOUNTER — Telehealth: Payer: Self-pay | Admitting: Physician Assistant

## 2019-09-02 NOTE — Telephone Encounter (Signed)
Charles [name] son called, but I was not able to get him, spoke to his daughter-in-law.  Charles [name] is in a facility, and has tested positive for Covid.  He has had a temperature of 101.  His respiratory status has been stable.  He has not been in significant distress.  They wonder if he should be taken somewhere.  I discussed the situation with her in detail.  I explained that right now, if he is stable and his respiratory status is good, he can stay where he is.  However, he has significant medical issues and needs to be followed closely.  I recommended that they contact the care providers at the facility and discuss the situation with them tomorrow.  If he remains stable, and his fever improves, he may be able to stay where he is.  If his condition changes at all, and he starts to deteriorate, he will need to be transported immediately to the closest hospital.  Rosaria Ferries, PA-C 09/02/2019 5:37 PM Beeper [phone]

## 2019-09-03 DIAGNOSIS — J1289 Other viral pneumonia: Secondary | ICD-10-CM | POA: Diagnosis not present

## 2019-09-03 DIAGNOSIS — J449 Chronic obstructive pulmonary disease, unspecified: Secondary | ICD-10-CM | POA: Diagnosis not present

## 2019-09-03 DIAGNOSIS — U071 COVID-19: Secondary | ICD-10-CM | POA: Diagnosis not present

## 2019-09-03 DIAGNOSIS — I5033 Acute on chronic diastolic (congestive) heart failure: Secondary | ICD-10-CM | POA: Diagnosis not present

## 2019-09-05 ENCOUNTER — Encounter: Payer: Self-pay | Admitting: Family Medicine

## 2019-09-06 ENCOUNTER — Encounter (HOSPITAL_COMMUNITY): Payer: Self-pay

## 2019-09-06 ENCOUNTER — Emergency Department (HOSPITAL_COMMUNITY): Payer: Medicare Other

## 2019-09-06 ENCOUNTER — Other Ambulatory Visit: Payer: Self-pay

## 2019-09-06 ENCOUNTER — Inpatient Hospital Stay (HOSPITAL_COMMUNITY)
Admission: EM | Admit: 2019-09-06 | Discharge: 2019-09-13 | DRG: 177 | Disposition: A | Discharge status: Skilled Nursing Facility | Payer: Medicare Other | Attending: Internal Medicine | Admitting: Internal Medicine

## 2019-09-06 DIAGNOSIS — I5022 Chronic systolic (congestive) heart failure: Secondary | ICD-10-CM | POA: Diagnosis not present

## 2019-09-06 DIAGNOSIS — Z79899 Other long term (current) drug therapy: Secondary | ICD-10-CM

## 2019-09-06 DIAGNOSIS — Z951 Presence of aortocoronary bypass graft: Secondary | ICD-10-CM

## 2019-09-06 DIAGNOSIS — I5042 Chronic combined systolic (congestive) and diastolic (congestive) heart failure: Secondary | ICD-10-CM | POA: Diagnosis not present

## 2019-09-06 DIAGNOSIS — Z86010 Personal history of colonic polyps: Secondary | ICD-10-CM | POA: Diagnosis not present

## 2019-09-06 DIAGNOSIS — I701 Atherosclerosis of renal artery: Secondary | ICD-10-CM

## 2019-09-06 DIAGNOSIS — F329 Major depressive disorder, single episode, unspecified: Secondary | ICD-10-CM | POA: Diagnosis present

## 2019-09-06 DIAGNOSIS — I4892 Unspecified atrial flutter: Secondary | ICD-10-CM | POA: Diagnosis not present

## 2019-09-06 DIAGNOSIS — Z953 Presence of xenogenic heart valve: Secondary | ICD-10-CM | POA: Diagnosis not present

## 2019-09-06 DIAGNOSIS — K219 Gastro-esophageal reflux disease without esophagitis: Secondary | ICD-10-CM | POA: Diagnosis not present

## 2019-09-06 DIAGNOSIS — R0902 Hypoxemia: Secondary | ICD-10-CM | POA: Diagnosis not present

## 2019-09-06 DIAGNOSIS — N183 Chronic kidney disease, stage 3 unspecified: Secondary | ICD-10-CM | POA: Diagnosis present

## 2019-09-06 DIAGNOSIS — E1122 Type 2 diabetes mellitus with diabetic chronic kidney disease: Secondary | ICD-10-CM | POA: Diagnosis not present

## 2019-09-06 DIAGNOSIS — J1289 Other viral pneumonia: Secondary | ICD-10-CM | POA: Diagnosis present

## 2019-09-06 DIAGNOSIS — I5032 Chronic diastolic (congestive) heart failure: Secondary | ICD-10-CM | POA: Diagnosis not present

## 2019-09-06 DIAGNOSIS — I10 Essential (primary) hypertension: Secondary | ICD-10-CM | POA: Diagnosis not present

## 2019-09-06 DIAGNOSIS — J449 Chronic obstructive pulmonary disease, unspecified: Secondary | ICD-10-CM | POA: Diagnosis not present

## 2019-09-06 DIAGNOSIS — R0602 Shortness of breath: Secondary | ICD-10-CM

## 2019-09-06 DIAGNOSIS — Z794 Long term (current) use of insulin: Secondary | ICD-10-CM | POA: Diagnosis not present

## 2019-09-06 DIAGNOSIS — N1832 Chronic kidney disease, stage 3b: Secondary | ICD-10-CM | POA: Diagnosis present

## 2019-09-06 DIAGNOSIS — G4700 Insomnia, unspecified: Secondary | ICD-10-CM | POA: Diagnosis not present

## 2019-09-06 DIAGNOSIS — I130 Hypertensive heart and chronic kidney disease with heart failure and stage 1 through stage 4 chronic kidney disease, or unspecified chronic kidney disease: Secondary | ICD-10-CM | POA: Diagnosis present

## 2019-09-06 DIAGNOSIS — Z743 Need for continuous supervision: Secondary | ICD-10-CM | POA: Diagnosis not present

## 2019-09-06 DIAGNOSIS — J9621 Acute and chronic respiratory failure with hypoxia: Secondary | ICD-10-CM

## 2019-09-06 DIAGNOSIS — E785 Hyperlipidemia, unspecified: Secondary | ICD-10-CM | POA: Diagnosis not present

## 2019-09-06 DIAGNOSIS — Z952 Presence of prosthetic heart valve: Secondary | ICD-10-CM

## 2019-09-06 DIAGNOSIS — E1165 Type 2 diabetes mellitus with hyperglycemia: Secondary | ICD-10-CM | POA: Diagnosis not present

## 2019-09-06 DIAGNOSIS — J9601 Acute respiratory failure with hypoxia: Secondary | ICD-10-CM | POA: Diagnosis not present

## 2019-09-06 DIAGNOSIS — J440 Chronic obstructive pulmonary disease with acute lower respiratory infection: Secondary | ICD-10-CM | POA: Diagnosis not present

## 2019-09-06 DIAGNOSIS — E7800 Pure hypercholesterolemia, unspecified: Secondary | ICD-10-CM | POA: Diagnosis present

## 2019-09-06 DIAGNOSIS — R079 Chest pain, unspecified: Secondary | ICD-10-CM | POA: Diagnosis not present

## 2019-09-06 DIAGNOSIS — Z66 Do not resuscitate: Secondary | ICD-10-CM | POA: Diagnosis not present

## 2019-09-06 DIAGNOSIS — I700 Atherosclerosis of aorta: Secondary | ICD-10-CM | POA: Diagnosis present

## 2019-09-06 DIAGNOSIS — I2510 Atherosclerotic heart disease of native coronary artery without angina pectoris: Secondary | ICD-10-CM | POA: Diagnosis not present

## 2019-09-06 DIAGNOSIS — U071 COVID-19: Secondary | ICD-10-CM

## 2019-09-06 DIAGNOSIS — Z7401 Bed confinement status: Secondary | ICD-10-CM | POA: Diagnosis not present

## 2019-09-06 DIAGNOSIS — M2550 Pain in unspecified joint: Secondary | ICD-10-CM | POA: Diagnosis not present

## 2019-09-06 DIAGNOSIS — R7401 Elevation of levels of liver transaminase levels: Secondary | ICD-10-CM | POA: Diagnosis not present

## 2019-09-06 DIAGNOSIS — T380X5A Adverse effect of glucocorticoids and synthetic analogues, initial encounter: Secondary | ICD-10-CM | POA: Diagnosis not present

## 2019-09-06 DIAGNOSIS — IMO0002 Reserved for concepts with insufficient information to code with codable children: Secondary | ICD-10-CM | POA: Diagnosis present

## 2019-09-06 DIAGNOSIS — J189 Pneumonia, unspecified organism: Secondary | ICD-10-CM | POA: Diagnosis not present

## 2019-09-06 DIAGNOSIS — N182 Chronic kidney disease, stage 2 (mild): Secondary | ICD-10-CM | POA: Diagnosis present

## 2019-09-06 DIAGNOSIS — R509 Fever, unspecified: Secondary | ICD-10-CM | POA: Diagnosis not present

## 2019-09-06 DIAGNOSIS — Z950 Presence of cardiac pacemaker: Secondary | ICD-10-CM

## 2019-09-06 DIAGNOSIS — R531 Weakness: Secondary | ICD-10-CM | POA: Diagnosis not present

## 2019-09-06 DIAGNOSIS — I480 Paroxysmal atrial fibrillation: Secondary | ICD-10-CM | POA: Diagnosis not present

## 2019-09-06 DIAGNOSIS — R262 Difficulty in walking, not elsewhere classified: Secondary | ICD-10-CM | POA: Diagnosis not present

## 2019-09-06 DIAGNOSIS — Z888 Allergy status to other drugs, medicaments and biological substances status: Secondary | ICD-10-CM

## 2019-09-06 DIAGNOSIS — J9691 Respiratory failure, unspecified with hypoxia: Secondary | ICD-10-CM

## 2019-09-06 DIAGNOSIS — M6281 Muscle weakness (generalized): Secondary | ICD-10-CM | POA: Diagnosis not present

## 2019-09-06 DIAGNOSIS — E118 Type 2 diabetes mellitus with unspecified complications: Secondary | ICD-10-CM | POA: Diagnosis not present

## 2019-09-06 HISTORY — DX: COVID-19: U07.1

## 2019-09-06 LAB — COMPREHENSIVE METABOLIC PANEL
ALT: 22 U/L (ref 0–44)
AST: 28 U/L (ref 15–41)
Albumin: 2.9 g/dL — ABNORMAL LOW (ref 3.5–5.0)
Alkaline Phosphatase: 73 U/L (ref 38–126)
Anion gap: 10 (ref 5–15)
BUN: 42 mg/dL — ABNORMAL HIGH (ref 8–23)
CO2: 30 mmol/L (ref 22–32)
Calcium: 9.2 mg/dL (ref 8.9–10.3)
Chloride: 95 mmol/L — ABNORMAL LOW (ref 98–111)
Creatinine, Ser: 1.77 mg/dL — ABNORMAL HIGH (ref 0.61–1.24)
GFR calc Af Amer: 40 mL/min — ABNORMAL LOW (ref >60)
GFR calc non Af Amer: 34 mL/min — ABNORMAL LOW (ref >60)
Glucose, Bld: 262 mg/dL — ABNORMAL HIGH (ref 70–99)
Potassium: 4.1 mmol/L (ref 3.5–5.1)
Sodium: 135 mmol/L (ref 135–145)
Total Bilirubin: 0.6 mg/dL (ref 0.3–1.2)
Total Protein: 6.9 g/dL (ref 6.5–8.1)

## 2019-09-06 LAB — C-REACTIVE PROTEIN: CRP: 9.4 mg/dL — ABNORMAL HIGH (ref <1.0)

## 2019-09-06 LAB — CBC WITH DIFFERENTIAL/PLATELET
Abs Immature Granulocytes: 0.39 10*3/uL — ABNORMAL HIGH (ref 0.00–0.07)
Basophils Absolute: 0 10*3/uL (ref 0.0–0.1)
Basophils Relative: 0 %
Eosinophils Absolute: 0 10*3/uL (ref 0.0–0.5)
Eosinophils Relative: 0 %
HCT: 32.8 % — ABNORMAL LOW (ref 39.0–52.0)
Hemoglobin: 10.4 g/dL — ABNORMAL LOW (ref 13.0–17.0)
Immature Granulocytes: 4 %
Lymphocytes Relative: 2 %
Lymphs Abs: 0.3 10*3/uL — ABNORMAL LOW (ref 0.7–4.0)
MCH: 29.7 pg (ref 26.0–34.0)
MCHC: 31.7 g/dL (ref 30.0–36.0)
MCV: 93.7 fL (ref 80.0–100.0)
Monocytes Absolute: 0.6 10*3/uL (ref 0.1–1.0)
Monocytes Relative: 6 %
Neutro Abs: 9.9 10*3/uL — ABNORMAL HIGH (ref 1.7–7.7)
Neutrophils Relative %: 88 %
Platelets: 229 10*3/uL (ref 150–400)
RBC: 3.5 MIL/uL — ABNORMAL LOW (ref 4.22–5.81)
RDW: 13.7 % (ref 11.5–15.5)
WBC: 11.2 10*3/uL — ABNORMAL HIGH (ref 4.0–10.5)
nRBC: 0 % (ref 0.0–0.2)

## 2019-09-06 LAB — CBG MONITORING, ED
Glucose-Capillary: 254 mg/dL — ABNORMAL HIGH (ref 70–99)
Glucose-Capillary: 238 mg/dL — ABNORMAL HIGH (ref 70–99)

## 2019-09-06 LAB — BRAIN NATRIURETIC PEPTIDE: B Natriuretic Peptide: 413.4 pg/mL — ABNORMAL HIGH (ref 0.0–100.0)

## 2019-09-06 LAB — TRIGLYCERIDES: Triglycerides: 160 mg/dL — ABNORMAL HIGH (ref <150)

## 2019-09-06 LAB — LACTATE DEHYDROGENASE: LDH: 285 U/L — ABNORMAL HIGH (ref 98–192)

## 2019-09-06 LAB — FIBRINOGEN: Fibrinogen: 738 mg/dL — ABNORMAL HIGH (ref 210–475)

## 2019-09-06 LAB — PROCALCITONIN: Procalcitonin: 0.16 ng/mL

## 2019-09-06 LAB — D-DIMER, QUANTITATIVE: D-Dimer, Quant: 1.05 ug/mL-FEU — ABNORMAL HIGH (ref 0.00–0.50)

## 2019-09-06 LAB — LACTIC ACID, PLASMA
Lactic Acid, Venous: 1.8 mmol/L (ref 0.5–1.9)
Lactic Acid, Venous: 1.2 mmol/L (ref 0.5–1.9)

## 2019-09-06 LAB — POC SARS CORONAVIRUS 2 AG -  ED: SARS Coronavirus 2 Ag: POSITIVE — AB

## 2019-09-06 LAB — FERRITIN: Ferritin: 397 ng/mL — ABNORMAL HIGH (ref 24–336)

## 2019-09-06 MED ORDER — INSULIN ASPART 100 UNIT/ML ~~LOC~~ SOLN: 0.0000 [IU] | Freq: Three times a day (TID) | SUBCUTANEOUS | Status: DC

## 2019-09-06 MED ORDER — INSULIN ASPART 100 UNIT/ML ~~LOC~~ SOLN: 5.0000 [IU] | Freq: Once | SUBCUTANEOUS | Status: DC

## 2019-09-06 MED ORDER — SODIUM CHLORIDE 0.9 % IV SOLN
200.0000 mg | Freq: Once | INTRAVENOUS | Status: AC
  Administered 2019-09-06: 200 mg via INTRAVENOUS
  Filled 2019-09-06: qty 40

## 2019-09-06 MED ORDER — INSULIN DETEMIR 100 UNIT/ML ~~LOC~~ SOLN
0.0750 [IU]/kg | Freq: Two times a day (BID) | SUBCUTANEOUS | Status: DC
  Administered 2019-09-06 – 2019-09-07 (×2): 8 [IU] via SUBCUTANEOUS
  Filled 2019-09-06 (×4): qty 0.08

## 2019-09-06 MED ORDER — DEXAMETHASONE SODIUM PHOSPHATE 10 MG/ML IJ SOLN: 10.0000 mg | Freq: Once | INTRAMUSCULAR | Status: DC

## 2019-09-06 MED ORDER — INSULIN GLARGINE 100 UNIT/ML ~~LOC~~ SOLN: 20.0000 [IU] | Freq: Every day | SUBCUTANEOUS | Status: DC

## 2019-09-06 MED ORDER — ALBUTEROL SULFATE HFA 108 (90 BASE) MCG/ACT IN AERS
8.0000 | INHALATION_SPRAY | Freq: Once | RESPIRATORY_TRACT | Status: AC
  Administered 2019-09-06: 8 via RESPIRATORY_TRACT
  Filled 2019-09-06: qty 6.7

## 2019-09-06 MED ORDER — SODIUM CHLORIDE 0.9 % IV SOLN
100.0000 mg | INTRAVENOUS | Status: AC
  Administered 2019-09-07 – 2019-09-10 (×4): 100 mg via INTRAVENOUS
  Filled 2019-09-06 (×5): qty 20

## 2019-09-06 MED ORDER — DEXAMETHASONE 4 MG PO TABS
6.0000 mg | ORAL_TABLET | Freq: Once | ORAL | Status: AC
  Administered 2019-09-06: 6 mg via ORAL
  Filled 2019-09-06: qty 2

## 2019-09-06 MED ORDER — INSULIN ASPART 100 UNIT/ML ~~LOC~~ SOLN
0.0000 [IU] | SUBCUTANEOUS | Status: DC
  Administered 2019-09-06: 5 [IU] via SUBCUTANEOUS
  Administered 2019-09-07: 2 [IU] via SUBCUTANEOUS
  Administered 2019-09-07: 1 [IU] via SUBCUTANEOUS
  Administered 2019-09-07: 5 [IU] via SUBCUTANEOUS
  Administered 2019-09-07: 2 [IU] via SUBCUTANEOUS
  Administered 2019-09-07: 5 [IU] via SUBCUTANEOUS
  Administered 2019-09-07: 2 [IU] via SUBCUTANEOUS
  Administered 2019-09-08: 1 [IU] via SUBCUTANEOUS
  Administered 2019-09-08: 3 [IU] via SUBCUTANEOUS
  Administered 2019-09-08: 9 [IU] via SUBCUTANEOUS
  Administered 2019-09-08: 3 [IU] via SUBCUTANEOUS
  Administered 2019-09-08 – 2019-09-09 (×3): 9 [IU] via SUBCUTANEOUS
  Administered 2019-09-09: 1 [IU] via SUBCUTANEOUS
  Administered 2019-09-09: 3 [IU] via SUBCUTANEOUS

## 2019-09-06 NOTE — ED Triage Notes (Signed)
Pt BIB [facility] for eval of shortness of breath, hypoxia, progressive weakness in the setting of COVID 19 and PNA from same. Pt has been seen and evluated for covid, fam wants re-evaluated d/t decline. Pt was found by EMS in bathroom @88 %, placed on 4LPM  By EMS w/ improvement to mid 90s. Pt appears tachypneic and dyspneic in triage. +cough.

## 2019-09-06 NOTE — H&P (Addendum)
History and Physical        Hospital Admission Note Date: 09/06/2019  Patient name: Charles [name] Medical record number: [id number] Date of birth: Mar 06, 1932 Age: 85 y.o. Gender: male  PCP: [name], MD  Patient coming from: [facility]  Chief Complaint    Shortness of breath   HPI:   This is a very pleasant 85 year old male with past medical history of CAD status post CABG, chronic combined systolic and diastolic heart failure a flutter status post PPM, AAA, COPD on 2 L nasal cannula at home, GERD, severe AS status post TAVR on Eliquis, hypertension, hyperlipidemia, type 2 diabetes, recent discharge on 11/16 for acute on chronic combined systolic and diastolic heart failure and discharged to SNF who recently tested positive for Covid on unknown recent date who presented to the ED 12/3 with worsening shortness of breath at [facility] and transferred to [facility] after EMS was called.  Patient was found down by EMS in bathroom at 88% and placed on 4 L/min nasal cannula with improvement to mid 90s.  Orting to previous note and family, patient was on an unknown antibiotic this week for pneumonia.  Patient seen and examined at bedside while provider was wearing full PPE with CAPR.  Patient stated that he felt well and actually having improved symptoms since presenting to the ED.  He states that he tested positive for Covid about 2 weeks ago however daughter-in-law stated over the phone patient tested positive this past Sunday.  He had admitted to increase shortness of breath and cough and minimally elevated oxygen requirements.   Review of Systems:  Review of Systems  Constitutional: Negative for chills and fever.  HENT: Negative for congestion and sore throat.   Eyes: Negative for blurred vision and double vision.  Respiratory: Positive for cough and shortness of breath.     Cardiovascular: Negative for chest pain, palpitations and leg swelling.  Gastrointestinal: Negative for abdominal pain, heartburn, nausea and vomiting.  Musculoskeletal: Negative for joint pain and myalgias.  Neurological: Negative for sensory change and headaches.  All other systems reviewed and are negative.   ED Workup/Course   In the ED, chest x-ray with left greater than right basilar pneumonia and atelectasis with small left pleural effusion.  Pertinent labs include: BNP 413, glucose 262, creatinine 1.77 (baseline 1.4-1.7), LDH 285, ferritin 397, CRP 9.4, procalcitonin 0.16, WBC 11.2, D-dimer 1.05, fibrinogen 738.  SARS-CoV-2 positive  Started on Decadron 10 mg and NovoLog 5 units.  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 s/p CABG in 2004 with LIMA-LAD, SVG-RCA, SVG-OM, and SVG-RI   Carotid stenosis  1999   s/p L CEA   Chronic diastolic CHF (congestive heart failure) ([place])    Colon polyp 2005   (Dr. 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   Edwards Sapien 3 THV (size 26 mm, model # 9600TFX, serial # [id number]) via the TF approach   Severe aortic stenosis    T2DM (type 2 diabetes mellitus) ([place]) 1995   Urge incontinence of urine     Past Surgical History:  Procedure Laterality Date   CARDIAC CATHETERIZATION  03/06/2003   No intervention - recommend CABG   CARDIOVASCULAR STRESS TEST  11/2010   normal perfusion, no evidence of ischemia, EF 62% post exercise   CARDIOVASCULAR STRESS TEST  11/28/2012   Mild diaphragmatic attenuation; cannot exclude a focal region of nontransmural inferior scar   CARDIOVERSION N/A 06/25/2015   Procedure: CARDIOVERSION;  Surgeon: Pixie Casino, MD;  Location: [place];  Service: Cardiovascular;  Laterality: N/A;   CARDIOVERSION N/A 11/02/2016   Procedure: CARDIOVERSION;  Surgeon: [name], MD;  Location: [place];  Service: Cardiovascular;  Laterality: N/A;   CARDIOVERSION N/A 03/20/2019   Procedure: CARDIOVERSION;  Surgeon: Jerline Pain, MD;  Location: [place] [facility];  Service: Cardiovascular;  Laterality: N/A;   CAROTID ENDARTERECTOMY Left 1999   ([place]ank[place])   CATARACT EXTRACTION W/ INTRAOCULAR LENS  IMPLANT, BILATERAL Bilateral 01/2013   [place]   COLONOSCOPY  2003   colon polyp x3 - adenomatous Tiffany Kocher)   COLONOSCOPY  10/08/2012   2 TA, diverticulosis, int hem, no rpt rec Tiffany Kocher)   CORONARY ANGIOPLASTY     CORONARY ARTERY BYPASS GRAFT  03/07/2003   4v CABG ([place]) with LIMA to LAD, vein graft to RCA, 1st obtuse marginal, and ramus intermedius   ESOPHAGOGASTRODUODENOSCOPY  10/08/2012   nl esophagus, duodenitis and erosive gastropathy, path - gastropathy no Hpylori, no rpt rec   LUMBAR EPIDURAL INJECTION  03/2017   L5/S1 ([place])   PACEMAKER IMPLANT N/A 04/21/2018   Procedure: PACEMAKER IMPLANT -- Dual Chamber;  Surgeon: Deboraha Sprang, MD;   Location: [place] [facility];  Service: Cardiovascular;  Laterality: N/A;   PERIPHERAL VASCULAR CATHETERIZATION N/A 02/02/2016   Procedure: Renal Angiography;  Surgeon: [name], MD;  Location: [place] [facility];  Service: Cardiovascular;  Laterality: N/A;   PERIPHERAL VASCULAR CATHETERIZATION N/A 02/02/2016   Procedure: Abdominal Aortogram;  Surgeon: [name], MD;  Location: [place] [facility];  Service: Cardiovascular;  Laterality: N/A;   RENAL DOPPLER  11/29/2011   Celiac&SMA-demonstrated vessel narrowing suggestive of a greater than 50% diameter reduction. Bilateral renal arteries-demonstrated vessel narrowing of 60-99% diameter reduction. Rt Kidney-mid pole lateral simple cyst noted measuring 1.29x0.76x1.11cm and exophytic cyst outside lower pole measuring 1.23x0.96x1.31. Lft Kidney-lateral mid to lower pole simple cyst measuering-1.24x9.83x1.24   RIGHT/LEFT HEART CATH AND CORONARY/GRAFT ANGIOGRAPHY N/A 10/20/2017   Procedure: RIGHT/LEFT HEART CATH AND CORONARY/GRAFT ANGIOGRAPHY;  Surgeon: Burnell Blanks, MD;  Location: [place] [facility];  Service: Cardiovascular;  Laterality: N/A;   TEE WITHOUT CARDIOVERSION N/A 06/25/2015   Procedure: TRANSESOPHAGEAL ECHOCARDIOGRAM (TEE);  Surgeon: Pixie Casino, MD;  Location: [facility]COPY;  Service: Cardiovascular;  Laterality: N/A;   TEE WITHOUT CARDIOVERSION N/A 11/02/2016   Procedure: TRANSESOPHAGEAL ECHOCARDIOGRAM (TEE);  Surgeon: [name], MD;  Location: [place];  Service: Cardiovascular;  Laterality: N/A;   TEE WITHOUT CARDIOVERSION N/A 12/05/2018   Procedure: TRANSESOPHAGEAL ECHOCARDIOGRAM (TEE);  Surgeon: Sherren Mocha, MD;  Location: [place];  Service: Open Heart Surgery;  Laterality: N/A;   TONSILLECTOMY     TRANSCATHETER AORTIC VALVE REPLACEMENT, TRANSFEMORAL N/A 12/05/2018   Procedure: TRANSCATHETER AORTIC VALVE REPLACEMENT, TRANSFEMORAL;  Surgeon: Sherren Mocha, MD;  Location: [place];  Service: Open Heart  Surgery;  Laterality: N/A;   UPPER GASTROINTESTINAL ENDOSCOPY  2003   reflux esophagitis, erosive gastropathy, duodenal ulcer    Medications: Prior to Admission medications   Medication Sig Start Date End Date Taking? Authorizing Provider  acetaminophen (TYLENOL) 500 MG tablet Take 1 tablet (500 mg total) by mouth 3 (three) times daily. Patient taking differently: Take 1,000 mg by mouth every 8 (eight) hours as needed for mild pain or headache.  02/17/16   [name], MD  amiodarone (PACERONE) 200 MG tablet Take 1 tablet (200 mg total) by mouth daily. 08/21/19   Eugenie Filler, MD  apixaban (ELIQUIS) 2.5 MG TABS tablet Take 1 tablet (2.5 mg total) by mouth 2 (two) times daily. 06/29/19   [name], MD  Ascorbic Acid (VITAMIN C PO) Take 1 tablet by mouth daily.    [provider]  atorvastatin (LIPITOR) 20 MG tablet Take 1 tablet (20 mg total) by mouth daily. 08/01/18   [name], MD  BD INSULIN SYRINGE U/F 30G X 1/2" 0.5 ML MISC AS DIRECTED. 03/06/19   [name], MD  budesonide-formoterol [facility]) 160-4.5 MCG/ACT inhaler Inhale 2 puffs into the lungs 2 (two) times daily. 08/20/19   Eugenie Filler, MD  citalopram (CELEXA) 20 MG tablet Take 1 tablet (20 mg total) by mouth daily. 07/16/19   [name], MD  finasteride (PROSCAR) 5 MG tablet Take 1 tablet (5 mg total) by mouth daily. 04/03/19   [name], MD  glucose blood (ACCU-CHEK AVIVA PLUS) test strip Use as instructed to check blood sugar three times a day. Dx code E11.8 & E11.65 04/10/19   [name], MD  guaiFENesin (MUCINEX) 600 MG 12 hr tablet Take 600 mg by mouth 2 (two) times daily as needed for cough or to loosen phlegm.    [provider]  insulin glargine (LANTUS) 100 UNIT/ML injection Lantus 22 units in the morning and 20 units at bedtime. 08/20/19   Eugenie Filler, MD  ipratropium-albuterol (DUONEB) 0.5-2.5 (3) MG/3ML SOLN Take 3 mLs by nebulization 2  (two) times daily. 08/20/19   Eugenie Filler, MD  lisinopril (ZESTRIL) 10 MG tablet Take 1 tablet (10 mg total) by mouth daily. 08/20/19 09/19/19  Eugenie Filler, MD  nitroGLYCERIN (NITROSTAT) 0.4 MG SL tablet Place 1 tablet (0.4 mg total) under the tongue every 5 (five) minutes as needed for chest pain. 03/08/18   Almyra Deforest, PA  pantoprazole (PROTONIX) 40 MG tablet Take 1 tablet (40 mg total) by mouth daily. Take 1 tablet (20 mg total) by mouth daily. 08/20/19   Eugenie Filler, MD  potassium chloride SA (K-DUR) 20 MEQ tablet Take 1 tablet (20 mEq total)  every Monday, Wednesday, Friday, Saturday, and Sunday. Patient taking differently: Take 20 mEq by mouth See admin instructions. Take 1 tablet (20 mEq total) every Monday, Wednesday, Friday, Saturday, and Sunday. 04/09/19   [name], MD  tamsulosin (FLOMAX) 0.4 MG CAPS capsule Take 1 capsule (0.4 mg total) by mouth daily after supper. 04/09/19   [name], MD  torsemide (DEMADEX) 20 MG tablet Take 3 tablets (60 mg total) by mouth daily. 07/24/19   Lendon Colonel, NP  traZODone (DESYREL) 50 MG tablet Take 1 tablet (50 mg total) by mouth at bedtime. 08/20/19   Eugenie Filler, MD    Allergies:   Allergies  Allergen Reactions   Gabapentin Other (See Comments)    Leg pain, weakness    Metoprolol Swelling   Spironolactone Other (See Comments)    Painful gynecomastia   Amlodipine Other (See Comments)    Edema   Other Other (See Comments)    Horse serum - myalgias   Rosiglitazone Maleate Other (See Comments)     Did not help   Tricor [Fenofibrate] Other (See Comments)    myalgias    Social History:  reports that he quit smoking about 6 years ago. His smoking use included cigarettes. He has a 18.00 pack-year smoking history. He has never used smokeless tobacco. He reports that he does not drink alcohol or use drugs.  Family History: Family History  Problem Relation Age of Onset   Hypertension Father     Heart disease Father    Cancer Mother        GI cancer   Diabetes Sister    Heart disease Sister    Stroke Sister    Hypertension Sister    Cancer Brother        Lung cancer - nonsmoker   Cancer Maternal Grandmother    Heart attack Maternal Grandfather    Heart attack Paternal Grandmother    Heart attack Paternal Grandfather    Cancer Brother        Pancreatic cancer   Lung disease Brother      Objective   Physical Exam: Blood pressure (!) 141/69, pulse 85, temperature 99.3 F (37.4 C), temperature source Oral, resp. rate (!) 30, height 6\' 1"  (1.854 m), weight 101.5 kg, SpO2 94 %.  Physical Exam Vitals signs and nursing note reviewed.  Constitutional:      General: He is not in acute distress.    Appearance: He is obese. He is not toxic-appearing.  HENT:     Head: Normocephalic.     Mouth/Throat:     Comments: Surgical mask in place Eyes:     Extraocular Movements: Extraocular movements intact.  Cardiovascular:     Comments: Paced rhythm on telemetry No disposable stethoscope at bedside and was unable to auscultate heart and lungs Pulmonary:     Effort: Pulmonary effort is normal. No tachypnea.     Comments: No respiratory distress, without accessory muscle use, requiring 3 L nasal cannula Abdominal:     General: There is no distension.     Tenderness: There is no abdominal tenderness.  Musculoskeletal:     Right lower leg: He exhibits no tenderness. No edema.     Left lower leg: He exhibits no tenderness. No edema.  Skin:    General: Skin is warm.     Coloration: Skin is not pale.  Neurological:     General: No focal deficit present.     Mental Status: He is alert and oriented  to person, place, and time.  Psychiatric:        Mood and Affect: Mood normal.        Behavior: Behavior normal.        Thought Content: Thought content normal.        Judgment: Judgment normal.     LABS on Admission: I have personally reviewed all the labs and  imagings below    Basic Metabolic Panel: Recent Labs  Lab 09/06/19 1500  NA 135  K 4.1  CL 95*  CO2 30  GLUCOSE 262*  BUN 42*  CREATININE 1.77*  CALCIUM 9.2   Liver Function Tests: Recent Labs  Lab 09/06/19 1500  AST 28  ALT 22  ALKPHOS 73  BILITOT 0.6  PROT 6.9  ALBUMIN 2.9*   No results for input(s): LIPASE, AMYLASE in the last 168 hours. No results for input(s): AMMONIA in the last 168 hours. CBC: Recent Labs  Lab 09/06/19 1500  WBC 11.2*  NEUTROABS 9.9*  HGB 10.4*  HCT 32.8*  MCV 93.7  PLT 229   Cardiac Enzymes: No results for input(s): CKTOTAL, CKMB, CKMBINDEX, TROPONINI in the last 168 hours. BNP: Invalid input(s): POCBNP CBG: No results for input(s): GLUCAP in the last 168 hours.  Radiological Exams on Admission:  Dg Chest Port 1 View  Result Date: 09/06/2019 CLINICAL DATA:  Shortness of breath, chest pain, positive COVID EXAM: PORTABLE CHEST 1 VIEW COMPARISON:  08/13/2019 FINDINGS: Patchy density primarily at the left greater than right lung bases. Small left pleural effusion. No pneumothorax. Stable cardiomediastinal contours. IMPRESSION: Left greater than right basilar pneumonia and atelectasis. Small left pleural effusion. Electronically Signed   By: Macy Mis M.D.   On: 09/06/2019 15:00      EKG: Independently reviewed.  Atrial sensed, V paced rhythm at baseline   A & P   Active Problems:   COVID-19   Acute on chronic hypoxic respiratory failure secondary to COVID-19 On 2 L nasal cannula at home, currently requiring 3 L Afebrile, hemodynamically stable Decadron 6 mg x 1 in ED with NovoLog 5 units once in setting of hyperglycemia WBC 11.2, LDH 285, ferritin 397, CRP 9.4, AST/ALT 28/22, negative procalcitonin CXR: Left greater than right basilar pneumonia and atelectasis with small pleural effusion -Continue Decadron -Start remdesivir -Continue home Eliquis for anticoagulation after discussion with pharmacist -CRP is 9, follow-up  in a.m. and if >10 or increasing oxygen requirements consider Actemra -Hold antibiotics at this time as negative procalcitonin and afebrile in setting of positive COVID-19 -Avoid nebulizers if possible to prevent aerosolization.  Inhalers ordered as below  SIRS positive secondary to COVID-19 Leukocytosis, tachypnea, increased oxygen requirements as above -Plan as above -Follow-up blood culture x1  Compensated combined systolic and diastolic heart failure [id number]: EF 60 to 65% BNP 413, chronically elevated -Continue home torsemide -Continue home lisinopril 10 mg daily as renal function at baseline -I/O and daily weights  Aflutter s/p PPM Stable -Continue amiodarone -Continue renally dosed Eliquis  Bilateral renal artery stenosis Started on ACE inhibitor at recent admission -Continue lisinopril 10 mg daily  Status post TAVR  Poorly controlled type 2 diabetes On Lantus 22 units in a.m. and 20 units at bedtime Glucose 262 in ED Has had hypoglycemic episodes in the past -HA1C -COVID-19 hyperglycemic order set: Levemir 8 units twice daily and sensitive sliding scale -Monitor for hyperglycemia in setting of Decadron (given 5 units NovoLog with first dose of Decadron in ED)  CKD 3 Baseline creatinine 1.4-1.7, currently 1.7 (  at baseline).  Creatinine clearance 38 -Monitor  COPD on chronic 2 L nasal cannula -Change home med to [facility] while inpatient -Albuterol inhaler -Limit nebulized treatments and COVID-19 patient to prevent aerosolization  DVT prophylaxis: Eliquis   Code Status: DNR Diet: Heart healthy/carb modified Family Communication: Admission, patients condition and plan of care including tests being ordered have been discussed with the patient who indicates understanding and agrees with the plan and Code Status. Patient's daughter-in-law was called  Disposition Plan: The appropriate patient status for this patient is INPATIENT. Inpatient status is judged to be reasonable  and necessary in order to provide the required intensity of service to ensure the patient's safety. The patient's presenting symptoms, physical exam findings, and initial radiographic and laboratory data in the context of their chronic comorbidities is felt to place them at high risk for further clinical deterioration. Furthermore, it is not anticipated that the patient will be medically stable for discharge from the hospital within 2 midnights of admission. The following factors support the patient status of inpatient.   " The patient's presenting symptoms include hypoxia. " The worrisome physical exam findings include increased oxygen requirements " The initial radiographic and laboratory data are worrisome because of Covid positive, elevated inflammatory markers,  " The chronic co-morbidities include poorly controlled diabetes, bilateral renal artery stenosis, atrial flutter with pacemaker, combined systolic and diastolic heart failure.   * I certify that at the point of admission it is my clinical judgment that the patient will require inpatient hospital care spanning beyond 2 midnights from the point of admission due to high intensity of service, high risk for further deterioration and high frequency of surveillance required.*    The medical decision making on this patient was of high complexity and the patient is at high risk for clinical deterioration, therefore this is a level 3  admission.  Consultants   None  Procedures   None  Time Spent on Admission: 65 minutes   Harold Hedge, DO [facility] 09/06/2019, 7:35 PM

## 2019-09-06 NOTE — Telephone Encounter (Signed)
Barrett, Evelene Croon, PA-C  You; [name], MD; Cristopher Estimable, RN Just now (12:55 PM)   Called and spoke to pt's daughter.  He has been seen twice this week by providers at [facility].  Today, they felt his breathing was a little worse, and are transferring him to [facility].   Rosaria Ferries, PA-C  09/06/2019  12:55 PM  Beeper [phone]

## 2019-09-06 NOTE — ED Provider Notes (Signed)
[facility] EMERGENCY DEPARTMENT Provider Note   CSN: [id number] Arrival date & time: 09/06/19  1359     History   Chief Complaint Chief Complaint  Patient presents with  . Shortness of Breath  . COVID    HPI Charles [name] is a 85 y.o. male.     HPI  Pt is an 85 year old male with PMH of CAD s/p CABG, CHF (EF preserved at 60-65%), Aflutter s/p PM, COPD on 2L [place] at home, GERD, s/p TAVR on eliquis, HTN, HLD, DMT2 who presents to the ED with worsening shortness of breath. Patient reports he has been feeling worse than normal lately.  He states he has had a cough but no fever.  He endorses shortness of breath especially when standing.  Per EMS to nursing, patient was found to be hypoxic to the 80s on home 2L O2. Pt denies any chest pain. Denies N/V/D.   Per patient's family, he has had a worsening cough today and his facility called concerned. They report he has been on antibiotics this week for PNA but they are not sure which antibiotic.   Past Medical History:  Diagnosis Date  . Abdominal aortic atherosclerosis ([place])    by xray  . Arthritis    in lower back (06/24/2015)  . Atrial flutter ([place])    notes 06/24/2015  . AV block, [place] 1 09/06/2017   Archie Endo 09/06/2017  . 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 CAD (coronary artery disease) 2004   a. s/p CABG in 2004 with LIMA-LAD, SVG-RCA, SVG-OM, and SVG-RI  . Carotid stenosis 1999   s/p L CEA  . Chronic diastolic CHF (congestive heart failure) ([place])   . Colon polyp 2005   (Dr. Tiffany Kocher)  . Compression fracture of L1 lumbar vertebra (HCC) remote  . COPD (chronic obstructive pulmonary disease) ([place]) 03/2011   by xray  . Depression    hx  . GERD (gastroesophageal reflux disease) 2003   h/o duodenal ulcer per EGD as well as esophagitis  . History of colon polyps 2003, 2005   adenomatous Vira Agar)  . History of tobacco abuse   . HLD (hyperlipidemia)   . HTN (hypertension)   .  Presence of permanent cardiac pacemaker   . Psoriasis   . Renal artery stenosis ([place]) 2004   70% bilateral, followed by cards  . 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 Severe aortic stenosis   . T2DM (type 2 diabetes mellitus) ([place]) 1995  . Urge incontinence of urine     Patient Active Problem List   Diagnosis Date Noted  . Pneumonia due to COVID-19 virus 09/07/2019  . COVID-19 09/06/2019  . Acute on chronic heart failure with preserved ejection fraction (HFpEF) ([place]) 08/13/2019  . Chronic respiratory failure ([place]) 07/19/2019  . AKI (acute kidney injury) ([place]) 05/25/2019  . CKD (chronic kidney disease), stage II 12/27/2018  . S/P TAVR (transcatheter aortic valve replacement)   . 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 Paroxysmal atrial flutter ([place])   . Scrotal swelling 02/16/2018  . Coronary artery disease involving native coronary artery of native heart without angina pectoris   . Acute on chronic diastolic CHF (congestive heart failure) ([place]) 09/06/2017  . Urinary urgency 07/01/2017  . Chronic anticoagulation 11/05/2016  . Chronic radicular pain of lower back  02/17/2016  . Aortic stenosis, severe 06/04/2014  . DDD (degenerative disc disease), lumbar 10/02/2013  . Ex-smoker   . Carotid arterial disease ([place]) 08/23/2013  . MDD (major depressive disorder), recurrent episode, moderate ([place]) 09/21/2007  . OSTEOARTHRITIS 09/21/2007  . COLONIC POLYPS 01/31/2007  . Diabetes mellitus type 2, uncontrolled, with complications ([place]) 99991111  . HYPERCHOLESTEROLEMIA 01/31/2007  . ERECTILE DYSFUNCTION 01/31/2007  . Essential hypertension 01/31/2007  . GERD 01/31/2007  . PSORIASIS 01/31/2007    Past Surgical History:  Procedure Laterality Date  . CARDIAC CATHETERIZATION  03/06/2003   No intervention - recommend CABG  . CARDIOVASCULAR STRESS  TEST  11/2010   normal perfusion, no evidence of ischemia, EF 62% post exercise  . CARDIOVASCULAR STRESS TEST  11/28/2012   Mild diaphragmatic attenuation; cannot exclude a focal region of nontransmural inferior scar  . CARDIOVERSION N/A 06/25/2015   Procedure: CARDIOVERSION;  Surgeon: Pixie Casino, MD;  [facility] END[facility]COPY;  Service: Cardiovascular;  Laterality: N/A;  . CARDIOVERSION N/A 11/02/2016   Procedure: CARDIOVERSION;  Surgeon: [name], MD;  Location: [facility];  Service: Cardiovascular;  Laterality: N/A;  . CARDIOVERSION N/A 03/20/2019   Procedure: CARDIOVERSION;  Surgeon: Jerline Pain, MD;  Location: [facility];  Service: Cardiovascular;  Laterality: N/A;  . CAROTID ENDARTERECTOMY Left 1999   ([place])  . CATARACT EXTRACTION W/ INTRAOCULAR LENS  IMPLANT, BILATERAL Bilateral 01/2013   [place]  . COLONOSCOPY  2003   colon polyp x3 - adenomatous Tiffany Kocher)  . COLONOSCOPY  10/08/2012   2 TA, diverticulosis, int hem, no rpt rec Tiffany Kocher)  . CORONARY ANGIOPLASTY    . CORONARY ARTERY BYPASS GRAFT  03/07/2003   4v CABG ([place]) with LIMA to LAD, vein graft to RCA, 1st obtuse marginal, and ramus intermedius  . ESOPHAGOGASTRODUODENOSCOPY  10/08/2012   nl esophagus, duodenitis and erosive gastropathy, path - gastropathy no Hpylori, no rpt rec  . LUMBAR EPIDURAL INJECTION  03/2017   L5/S1 ([place])  . PACEMAKER IMPLANT N/A 04/21/2018   Procedure: PACEMAKER IMPLANT -- Dual Chamber;  Surgeon: Deboraha Sprang, MD;  Location: [facility] CV LAB;  Service: Cardiovascular;  Laterality: N/A;  . PERIPHERAL VASCULAR CATHETERIZATION N/A 02/02/2016   Procedure: Renal Angiography;  Surgeon: [name], MD;  Location: [facility] CV LAB;  Service: Cardiovascular;  Laterality: N/A;  . PERIPHERAL VASCULAR CATHETERIZATION N/A 02/02/2016   Procedure: Abdominal Aortogram;  Surgeon: [name], MD;  Location: [facility] CV LAB;  Service: Cardiovascular;  Laterality: N/A;  . RENAL DOPPLER   11/29/2011   Celiac&SMA-demonstrated vessel narrowing suggestive of a greater than 50% diameter reduction. Bilateral renal arteries-demonstrated vessel narrowing of 60-99% diameter reduction. Rt Kidney-mid pole lateral simple cyst noted measuring 1.29x0.76x1.11cm and exophytic cyst outside lower pole measuring 1.23x0.96x1.31. Lft Kidney-lateral mid to lower pole simple cyst measuering-1.24x9.83x1.24  . RIGHT/LEFT HEART CATH AND CORONARY/GRAFT ANGIOGRAPHY N/A 10/20/2017   Procedure: RIGHT/LEFT HEART CATH AND CORONARY/GRAFT ANGIOGRAPHY;  Surgeon: Burnell Blanks, MD;  Location: [facility] CV LAB;  Service: Cardiovascular;  Laterality: N/A;  . TEE WITHOUT CARDIOVERSION N/A 06/25/2015   Procedure: TRANSESOPHAGEAL ECHOCARDIOGRAM (TEE);  Surgeon: Pixie Casino, MD;  [facility] ENDOSCOPY;  Service: Cardiovascular;  Laterality: N/A;  . TEE WITHOUT CARDIOVERSION N/A 11/02/2016   Procedure: TRANSESOPHAGEAL ECHOCARDIOGRAM (TEE);  Surgeon: [name], MD;  Location: [facility];  Service: Cardiovascular;  Laterality: N/A;  . TEE WITHOUT CARDIOVERSION N/A 12/05/2018   Procedure: TRANSESOPHAGEAL ECHOCARDIOGRAM (TEE);  Surgeon: Sherren Mocha, MD;  Location: [facility];  Service:  Open Heart Surgery;  Laterality: N/A;  . TONSILLECTOMY    . TRANSCATHETER AORTIC VALVE REPLACEMENT, TRANSFEMORAL N/A 12/05/2018   Procedure: TRANSCATHETER AORTIC VALVE REPLACEMENT, TRANSFEMORAL;  Surgeon: Sherren Mocha, MD;  Location: [employer];  Service: Open Heart Surgery;  Laterality: N/A;  . UPPER GASTROINTESTINAL ENDOSCOPY  2003   reflux esophagitis, erosive gastropathy, duodenal ulcer        Home Medications    Prior to Admission medications   Medication Sig Start Date End Date Taking? Authorizing Provider  acetaminophen (TYLENOL) 500 MG tablet Take 1 tablet (500 mg total) by mouth 3 (three) times daily. Patient taking differently: Take 1,000 mg by mouth every 8 (eight) hours as needed for mild pain or headache.   02/17/16  Yes [name], MD  amiodarone (PACERONE) 200 MG tablet Take 1 tablet (200 mg total) by mouth daily. 08/21/19  Yes Eugenie Filler, MD  apixaban (ELIQUIS) 2.5 MG TABS tablet Take 1 tablet (2.5 mg total) by mouth 2 (two) times daily. 06/29/19  Yes [name], MD  Ascorbic Acid (VITAMIN C PO) Take 1 tablet by mouth daily.   Yes [provider]  atorvastatin (LIPITOR) 20 MG tablet Take 1 tablet (20 mg total) by mouth daily. 08/01/18  Yes [name], MD  BD INSULIN SYRINGE U/F 30G X 1/2" 0.5 ML MISC AS DIRECTED. 03/06/19  Yes [name], MD  budesonide-formoterol [facility]) 160-4.5 MCG/ACT inhaler Inhale 2 puffs into the lungs 2 (two) times daily. 08/20/19  Yes Eugenie Filler, MD  citalopram (CELEXA) 20 MG tablet Take 1 tablet (20 mg total) by mouth daily. 07/16/19  Yes [name], MD  finasteride (PROSCAR) 5 MG tablet Take 1 tablet (5 mg total) by mouth daily. 04/03/19  Yes [name], MD  glucose blood (ACCU-CHEK AVIVA PLUS) test strip Use as instructed to check blood sugar three times a day. Dx code E11.8 & E11.65 04/10/19  Yes [name], MD  guaiFENesin (MUCINEX) 600 MG 12 hr tablet Take 600 mg by mouth 2 (two) times daily as needed for cough or to loosen phlegm.   Yes [provider]  insulin glargine (LANTUS) 100 UNIT/ML injection Lantus 22 units in the morning and 20 units at bedtime. 08/20/19  Yes Eugenie Filler, MD  ipratropium-albuterol (DUONEB) 0.5-2.5 (3) MG/3ML SOLN Take 3 mLs by nebulization 2 (two) times daily. 08/20/19  Yes Eugenie Filler, MD  lisinopril (ZESTRIL) 10 MG tablet Take 1 tablet (10 mg total) by mouth daily. 08/20/19 09/19/19 Yes Eugenie Filler, MD  nitroGLYCERIN (NITROSTAT) 0.4 MG SL tablet Place 1 tablet (0.4 mg total) under the tongue every 5 (five) minutes as needed for chest pain. 03/08/18  Yes Almyra Deforest, PA  pantoprazole (PROTONIX) 40 MG tablet Take 1 tablet (40 mg total) by mouth  daily. Take 1 tablet (20 mg total) by mouth daily. 08/20/19  Yes Eugenie Filler, MD  potassium chloride SA (K-DUR) 20 MEQ tablet Take 1 tablet (20 mEq total) every Monday, Wednesday, Friday, Saturday, and Sunday. Patient taking differently: Take 20 mEq by mouth See admin instructions. Take 1 tablet (20 mEq total) every Monday, Wednesday, Friday, Saturday, and Sunday. 04/09/19  Yes [name], MD  tamsulosin (FLOMAX) 0.4 MG CAPS capsule Take 1 capsule (0.4 mg total) by mouth daily after supper. 04/09/19  Yes [name], MD  torsemide (DEMADEX) 20 MG tablet Take 3 tablets (60 mg total) by mouth daily. 07/24/19  Yes Lendon Colonel, NP  traZODone (DESYREL) 50 MG tablet  Take 1 tablet (50 mg total) by mouth at bedtime. 08/20/19  Yes Eugenie Filler, MD    Family History Family History  Problem Relation Age of Onset  . Hypertension Father   . Heart disease Father   . Cancer Mother        GI cancer  . Diabetes Sister   . Heart disease Sister   . Stroke Sister   . Hypertension Sister   . Cancer Brother        Lung cancer - nonsmoker  . Cancer Maternal Grandmother   . Heart attack Maternal Grandfather   . Heart attack Paternal Grandmother   . Heart attack Paternal Grandfather   . Cancer Brother        Pancreatic cancer  . Lung disease Brother     Social History Social History   Tobacco Use  . Smoking status: Former Smoker    Packs/day: 0.50    Years: 36.00    Pack years: 18.00    Types: Cigarettes    Quit date: 07/23/2013    Years since quitting: 6.1  . Smokeless tobacco: Never Used  Substance Use Topics  . Alcohol use: No  . Drug use: No     Allergies   Gabapentin, Metoprolol, Spironolactone, Amlodipine, Other, Rosiglitazone maleate, and Tricor [fenofibrate]   Review of Systems Review of Systems  Constitutional: Negative for chills and fever.  HENT: Negative for congestion.   Eyes: Negative for pain and visual disturbance.  Respiratory: Positive  for cough and shortness of breath.   Cardiovascular: Negative for chest pain, palpitations and leg swelling.  Gastrointestinal: Negative for abdominal pain and vomiting.  Genitourinary: Negative for dysuria and hematuria.  Musculoskeletal: Negative for arthralgias and back pain.  Skin: Negative for color change and rash.  Neurological: Negative for seizures and syncope.  All other systems reviewed and are negative.    Physical Exam Updated Vital Signs BP 117/61   Pulse 95   Temp 99.3 F (37.4 C) (Oral)   Resp (!) 23   Ht 6\' 1"  (1.854 m)   Wt 101.5 kg   SpO2 (!) 88%   BMI 29.52 kg/m   Physical Exam Vitals signs and nursing note reviewed.  Constitutional:      Appearance: He is well-developed.  HENT:     Head: Normocephalic and atraumatic.  Eyes:     Extraocular Movements: Extraocular movements intact.     Conjunctiva/sclera: Conjunctivae normal.  Neck:     Musculoskeletal: Neck supple.  Cardiovascular:     Rate and Rhythm: Normal rate and regular rhythm.     Heart sounds: No murmur.  Pulmonary:     Effort: Pulmonary effort is normal. No respiratory distress.     Breath sounds: Wheezing (Mild diffuse expiratory wheezes) present.  Abdominal:     Palpations: Abdomen is soft.     Tenderness: There is no abdominal tenderness.  Musculoskeletal:     Right lower leg: No edema.     Left lower leg: No edema.  Skin:    General: Skin is warm and dry.  Neurological:     General: No focal deficit present.     Mental Status: He is alert and oriented to person, place, and time.  Psychiatric:        Mood and Affect: Mood normal.        Behavior: Behavior normal.      ED Treatments / Results  Labs (all labs ordered are listed, but only abnormal results are displayed) Labs  Reviewed  CBC WITH DIFFERENTIAL/PLATELET - Abnormal; Notable for the following components:      Result Value   WBC 11.2 (*)    RBC 3.50 (*)    Hemoglobin 10.4 (*)    HCT 32.8 (*)    Neutro Abs 9.9  (*)    Lymphs Abs 0.3 (*)    Abs Immature Granulocytes 0.39 (*)    All other components within normal limits  COMPREHENSIVE METABOLIC PANEL - Abnormal; Notable for the following components:   Chloride 95 (*)    Glucose, Bld 262 (*)    BUN 42 (*)    Creatinine, Ser 1.77 (*)    Albumin 2.9 (*)    GFR calc non Af Amer 34 (*)    GFR calc Af Amer 40 (*)    All other components within normal limits  D-DIMER, QUANTITATIVE (NOT AT [facility]) - Abnormal; Notable for the following components:   D-Dimer, Quant 1.05 (*)    All other components within normal limits  LACTATE DEHYDROGENASE - Abnormal; Notable for the following components:   LDH 285 (*)    All other components within normal limits  FERRITIN - Abnormal; Notable for the following components:   Ferritin 397 (*)    All other components within normal limits  FIBRINOGEN - Abnormal; Notable for the following components:   Fibrinogen 738 (*)    All other components within normal limits  C-REACTIVE PROTEIN - Abnormal; Notable for the following components:   CRP 9.4 (*)    All other components within normal limits  BRAIN NATRIURETIC PEPTIDE - Abnormal; Notable for the following components:   B Natriuretic Peptide 413.4 (*)    All other components within normal limits  TRIGLYCERIDES - Abnormal; Notable for the following components:   Triglycerides 160 (*)    All other components within normal limits  HEMOGLOBIN A1C - Abnormal; Notable for the following components:   Hgb A1c MFr Bld 9.0 (*)    All other components within normal limits  CBC WITH DIFFERENTIAL/PLATELET - Abnormal; Notable for the following components:   RBC 3.42 (*)    Hemoglobin 10.0 (*)    HCT 32.0 (*)    Neutro Abs 8.6 (*)    Lymphs Abs 0.3 (*)    Abs Immature Granulocytes 0.31 (*)    All other components within normal limits  COMPREHENSIVE METABOLIC PANEL - Abnormal; Notable for the following components:   Chloride 97 (*)    Glucose, Bld 161 (*)    BUN 45 (*)     Creatinine, Ser 1.71 (*)    Albumin 2.7 (*)    GFR calc non Af Amer 36 (*)    GFR calc Af Amer 41 (*)    All other components within normal limits  D-DIMER, QUANTITATIVE (NOT AT [facility]) - Abnormal; Notable for the following components:   D-Dimer, Quant 0.91 (*)    All other components within normal limits  FERRITIN - Abnormal; Notable for the following components:   Ferritin 411 (*)    All other components within normal limits  C-REACTIVE PROTEIN - Abnormal; Notable for the following components:   CRP 11.8 (*)    All other components within normal limits  POC SARS CORONAVIRUS 2 AG -  ED - Abnormal; Notable for the following components:   SARS Coronavirus 2 Ag POSITIVE (*)    All other components within normal limits  CBG MONITORING, ED - Abnormal; Notable for the following components:   Glucose-Capillary 254 (*)  All other components within normal limits  CBG MONITORING, ED - Abnormal; Notable for the following components:   Glucose-Capillary 238 (*)    All other components within normal limits  CBG MONITORING, ED - Abnormal; Notable for the following components:   Glucose-Capillary 176 (*)    All other components within normal limits  CBG MONITORING, ED - Abnormal; Notable for the following components:   Glucose-Capillary 173 (*)    All other components within normal limits  CBG MONITORING, ED - Abnormal; Notable for the following components:   Glucose-Capillary 143 (*)    All other components within normal limits  CBG MONITORING, ED - Abnormal; Notable for the following components:   Glucose-Capillary 280 (*)    All other components within normal limits  CULTURE, BLOOD (ROUTINE X 2)  CULTURE, BLOOD (SINGLE)  CULTURE, BLOOD (ROUTINE X 2)  LACTIC ACID, PLASMA  LACTIC ACID, PLASMA  PROCALCITONIN    EKG EKG Interpretation  Date/Time:  Thursday September 06 2019 14:06:19 EST Ventricular Rate:  91 PR Interval:  178 QRS Duration: 178 QT Interval:  428 QTC Calculation: 526  R Axis:   -112 Text Interpretation: Atrial-sensed ventricular-paced rhythm Abnormal ECG Confirmed by Carmin Muskrat [phone]) on 09/06/2019 2:31:40 PM   Radiology Dg Chest Port 1 View  Result Date: 09/06/2019 CLINICAL DATA:  Shortness of breath, chest pain, positive COVID EXAM: PORTABLE CHEST 1 VIEW COMPARISON:  08/13/2019 FINDINGS: Patchy density primarily at the left greater than right lung bases. Small left pleural effusion. No pneumothorax. Stable cardiomediastinal contours. IMPRESSION: Left greater than right basilar pneumonia and atelectasis. Small left pleural effusion. Electronically Signed   By: Macy Mis M.D.   On: 09/06/2019 15:00    Procedures Procedures (including critical care time)  Medications Ordered in ED Medications  insulin aspart (novoLOG) injection 5 Units (5 Units Intravenous Not Given 09/06/19 2029)  amiodarone (PACERONE) tablet 200 mg (200 mg Oral Given 09/07/19 1056)  atorvastatin (LIPITOR) tablet 20 mg (20 mg Oral Given 09/07/19 1056)  nitroGLYCERIN (NITROSTAT) SL tablet 0.4 mg (has no administration in time range)  torsemide (DEMADEX) tablet 60 mg (60 mg Oral Given 09/07/19 1056)  mometasone-formoterol (DULERA) 200-5 MCG/ACT inhaler 2 puff (2 puffs Inhalation Given 09/07/19 1212)  apixaban (ELIQUIS) tablet 2.5 mg (2.5 mg Oral Given 09/07/19 1213)  finasteride (PROSCAR) tablet 5 mg (5 mg Oral Given 09/07/19 1213)  tamsulosin (FLOMAX) capsule 0.4 mg (has no administration in time range)  citalopram (CELEXA) tablet 20 mg (20 mg Oral Given 09/07/19 1057)  traZODone (DESYREL) tablet 50 mg (has no administration in time range)  pantoprazole (PROTONIX) EC tablet 40 mg (40 mg Oral Given 09/07/19 1056)  sodium chloride flush (NS) 0.9 % injection 3 mL (3 mLs Intravenous Given 09/07/19 1215)  dexamethasone (DECADRON) tablet 6 mg (6 mg Oral Given 09/07/19 0609)  albuterol (VENTOLIN HFA) 108 (90 Base) MCG/ACT inhaler 2 puff (2 puffs Inhalation Given 09/07/19 0824)  acetaminophen  (TYLENOL) tablet 650 mg (has no administration in time range)  polyethylene glycol (MIRALAX / GLYCOLAX) packet 17 g (has no administration in time range)  multivitamin with minerals tablet 1 tablet (1 tablet Oral Given 09/07/19 1056)  remdesivir 200 mg in sodium chloride 0.9% 250 mL IVPB (0 mg Intravenous Stopped 09/06/19 2112)    Followed by  remdesivir 100 mg in sodium chloride 0.9 % 100 mL IVPB (0 mg Intravenous Stopped 09/07/19 1214)  insulin detemir (LEVEMIR) injection 8 Units (8 Units Subcutaneous Given 09/07/19 1210)  insulin aspart (novoLOG) injection 0-9  Units (5 Units Subcutaneous Given 09/07/19 1208)  albuterol (VENTOLIN HFA) 108 (90 Base) MCG/ACT inhaler 8 puff (8 puffs Inhalation Given 09/06/19 1639)  dexamethasone (DECADRON) tablet 6 mg (6 mg Oral Given 09/06/19 2027)     Initial Impression / Assessment and Plan / ED Course  I have reviewed the triage vital signs and the nursing notes.  Pertinent labs & imaging results that were available during my care of the patient were reviewed by me and considered in my medical decision making (see chart for details).    Charles [name] was evaluated in Emergency Department on 09/07/2019 for the symptoms described in the history of present illness. He was evaluated in the context of the global COVID-19 pandemic, which necessitated consideration that the patient might be at risk for infection with the SARS-CoV-2 virus that causes COVID-19. Institutional protocols and algorithms that pertain to the evaluation of patients at risk for COVID-19 are in a state of rapid change based on information released by regulatory bodies including the CDC and federal and state organizations. These policies and algorithms were followed during the patient's care in the ED.   On arrival, patient is afebrile, HDS.  Patient is currently on 3 L nasal cannula, increased from his home 2 L and satting 93%.  Patient is in no acute respiratory distress.  Considered: COVID-19,  post viral pneumonia, COPD exacerbation, CHF exacerbation; patient denies any chest pain to suggest ACS.  Low suspicion for PE as patient is chronically on anticoagulation and has of the primary reasons for his worsening hypoxia.  EKG: Ventricular paced rhythm, similar compared to prior Chest x-ray: Concerning for left greater than right basilar pneumonia.  Patient given albuterol. Per chart review, patient is currently on antibiotic therapy,unclear which Abx. Overall, concern for viral PNA in setting of Covid-19 infection leading to superimposed COPD exacerbation. Pt given steroids and albuterol. Continues to be well appearing upon reassessment. Hospitalist contacted for admission and have assumed care of patient. No further acute events.    Final Clinical Impressions(s) / ED Diagnoses   Final diagnoses:  [id number]  Community acquired pneumonia, unspecified laterality    ED Discharge Orders    None       Burns Spain, MD 09/07/19 1243    Blanchie Dessert, MD 09/08/19 [phone]

## 2019-09-07 ENCOUNTER — Encounter (HOSPITAL_COMMUNITY): Payer: Self-pay

## 2019-09-07 ENCOUNTER — Ambulatory Visit: Payer: Medicare Other | Admitting: Cardiology

## 2019-09-07 ENCOUNTER — Other Ambulatory Visit: Payer: Self-pay

## 2019-09-07 DIAGNOSIS — J1282 Pneumonia due to coronavirus disease 2019: Secondary | ICD-10-CM

## 2019-09-07 DIAGNOSIS — U071 COVID-19: Secondary | ICD-10-CM | POA: Diagnosis present

## 2019-09-07 HISTORY — DX: Pneumonia due to coronavirus disease 2019: J12.82

## 2019-09-07 HISTORY — DX: COVID-19: U07.1

## 2019-09-07 LAB — COMPREHENSIVE METABOLIC PANEL
ALT: 19 U/L (ref 0–44)
AST: 23 U/L (ref 15–41)
Albumin: 2.7 g/dL — ABNORMAL LOW (ref 3.5–5.0)
Alkaline Phosphatase: 70 U/L (ref 38–126)
Anion gap: 10 (ref 5–15)
BUN: 45 mg/dL — ABNORMAL HIGH (ref 8–23)
CO2: 32 mmol/L (ref 22–32)
Calcium: 9.4 mg/dL (ref 8.9–10.3)
Chloride: 97 mmol/L — ABNORMAL LOW (ref 98–111)
Creatinine, Ser: 1.71 mg/dL — ABNORMAL HIGH (ref 0.61–1.24)
GFR calc Af Amer: 41 mL/min — ABNORMAL LOW (ref >60)
GFR calc non Af Amer: 36 mL/min — ABNORMAL LOW (ref >60)
Glucose, Bld: 161 mg/dL — ABNORMAL HIGH (ref 70–99)
Potassium: 4.2 mmol/L (ref 3.5–5.1)
Sodium: 139 mmol/L (ref 135–145)
Total Bilirubin: 0.6 mg/dL (ref 0.3–1.2)
Total Protein: 6.6 g/dL (ref 6.5–8.1)

## 2019-09-07 LAB — CBC WITH DIFFERENTIAL/PLATELET
Abs Immature Granulocytes: 0.31 10*3/uL — ABNORMAL HIGH (ref 0.00–0.07)
Basophils Absolute: 0 10*3/uL (ref 0.0–0.1)
Basophils Relative: 0 %
Eosinophils Absolute: 0 10*3/uL (ref 0.0–0.5)
Eosinophils Relative: 0 %
HCT: 32 % — ABNORMAL LOW (ref 39.0–52.0)
Hemoglobin: 10 g/dL — ABNORMAL LOW (ref 13.0–17.0)
Immature Granulocytes: 3 %
Lymphocytes Relative: 3 %
Lymphs Abs: 0.3 10*3/uL — ABNORMAL LOW (ref 0.7–4.0)
MCH: 29.2 pg (ref 26.0–34.0)
MCHC: 31.3 g/dL (ref 30.0–36.0)
MCV: 93.6 fL (ref 80.0–100.0)
Monocytes Absolute: 0.6 10*3/uL (ref 0.1–1.0)
Monocytes Relative: 6 %
Neutro Abs: 8.6 10*3/uL — ABNORMAL HIGH (ref 1.7–7.7)
Neutrophils Relative %: 88 %
Platelets: 210 10*3/uL (ref 150–400)
RBC: 3.42 MIL/uL — ABNORMAL LOW (ref 4.22–5.81)
RDW: 13.8 % (ref 11.5–15.5)
WBC: 9.8 10*3/uL (ref 4.0–10.5)
nRBC: 0 % (ref 0.0–0.2)

## 2019-09-07 LAB — HEMOGLOBIN A1C
Hgb A1c MFr Bld: 9 % — ABNORMAL HIGH (ref 4.8–5.6)
Mean Plasma Glucose: 211.6 mg/dL

## 2019-09-07 LAB — CBG MONITORING, ED
Glucose-Capillary: 176 mg/dL — ABNORMAL HIGH (ref 70–99)
Glucose-Capillary: 173 mg/dL — ABNORMAL HIGH (ref 70–99)
Glucose-Capillary: 143 mg/dL — ABNORMAL HIGH (ref 70–99)
Glucose-Capillary: 280 mg/dL — ABNORMAL HIGH (ref 70–99)
Glucose-Capillary: 294 mg/dL — ABNORMAL HIGH (ref 70–99)

## 2019-09-07 LAB — C-REACTIVE PROTEIN: CRP: 11.8 mg/dL — ABNORMAL HIGH (ref <1.0)

## 2019-09-07 LAB — GLUCOSE, CAPILLARY: Glucose-Capillary: 170 mg/dL — ABNORMAL HIGH (ref 70–99)

## 2019-09-07 LAB — FERRITIN: Ferritin: 411 ng/mL — ABNORMAL HIGH (ref 24–336)

## 2019-09-07 LAB — D-DIMER, QUANTITATIVE: D-Dimer, Quant: 0.91 ug/mL-FEU — ABNORMAL HIGH (ref 0.00–0.50)

## 2019-09-07 MED ORDER — AMIODARONE HCL 200 MG PO TABS
200.0000 mg | ORAL_TABLET | Freq: Every day | ORAL | Status: DC
  Administered 2019-09-07 – 2019-09-13 (×7): 200 mg via ORAL
  Filled 2019-09-07 (×3): qty 2
  Filled 2019-09-07: qty 1
  Filled 2019-09-07: qty 2
  Filled 2019-09-07: qty 1
  Filled 2019-09-07: qty 2

## 2019-09-07 MED ORDER — ADULT MULTIVITAMIN W/MINERALS CH
1.0000 | ORAL_TABLET | Freq: Every day | ORAL | Status: DC
  Administered 2019-09-07 – 2019-09-13 (×7): 1 via ORAL
  Filled 2019-09-07 (×7): qty 1

## 2019-09-07 MED ORDER — FINASTERIDE 5 MG PO TABS
5.0000 mg | ORAL_TABLET | Freq: Every day | ORAL | Status: DC
  Administered 2019-09-07 – 2019-09-13 (×7): 5 mg via ORAL
  Filled 2019-09-07 (×8): qty 1

## 2019-09-07 MED ORDER — INSULIN DETEMIR 100 UNIT/ML ~~LOC~~ SOLN
12.0000 [IU] | Freq: Two times a day (BID) | SUBCUTANEOUS | Status: DC
  Administered 2019-09-08: 12 [IU] via SUBCUTANEOUS
  Filled 2019-09-07 (×3): qty 0.12

## 2019-09-07 MED ORDER — DM-GUAIFENESIN ER 30-600 MG PO TB12
1.0000 | ORAL_TABLET | Freq: Two times a day (BID) | ORAL | Status: DC
  Administered 2019-09-07 – 2019-09-08 (×3): 1 via ORAL
  Filled 2019-09-07 (×3): qty 1

## 2019-09-07 MED ORDER — ALBUTEROL SULFATE HFA 108 (90 BASE) MCG/ACT IN AERS
2.0000 | INHALATION_SPRAY | Freq: Four times a day (QID) | RESPIRATORY_TRACT | Status: DC
  Administered 2019-09-07 – 2019-09-13 (×21): 2 via RESPIRATORY_TRACT
  Filled 2019-09-07: qty 6.7

## 2019-09-07 MED ORDER — ATORVASTATIN CALCIUM 10 MG PO TABS
20.0000 mg | ORAL_TABLET | Freq: Every day | ORAL | Status: DC
  Administered 2019-09-07 – 2019-09-13 (×7): 20 mg via ORAL
  Filled 2019-09-07 (×7): qty 2

## 2019-09-07 MED ORDER — POLYETHYLENE GLYCOL 3350 17 G PO PACK
17.0000 g | PACK | Freq: Every day | ORAL | Status: DC | PRN
  Administered 2019-09-10: 17 g via ORAL
  Filled 2019-09-07: qty 1

## 2019-09-07 MED ORDER — MOMETASONE FURO-FORMOTEROL FUM 200-5 MCG/ACT IN AERO
2.0000 | INHALATION_SPRAY | Freq: Two times a day (BID) | RESPIRATORY_TRACT | Status: DC
  Administered 2019-09-07 – 2019-09-13 (×13): 2 via RESPIRATORY_TRACT
  Filled 2019-09-07 (×2): qty 8.8

## 2019-09-07 MED ORDER — DEXAMETHASONE 6 MG PO TABS
6.0000 mg | ORAL_TABLET | ORAL | Status: DC
  Administered 2019-09-07 – 2019-09-11 (×5): 6 mg via ORAL
  Filled 2019-09-07 (×2): qty 1
  Filled 2019-09-07: qty 2
  Filled 2019-09-07 (×2): qty 1

## 2019-09-07 MED ORDER — TRAZODONE HCL 50 MG PO TABS
50.0000 mg | ORAL_TABLET | Freq: Every day | ORAL | Status: DC
  Administered 2019-09-07 – 2019-09-12 (×5): 50 mg via ORAL
  Filled 2019-09-07 (×5): qty 1

## 2019-09-07 MED ORDER — TAMSULOSIN HCL 0.4 MG PO CAPS
0.4000 mg | ORAL_CAPSULE | Freq: Every day | ORAL | Status: DC
  Administered 2019-09-07 – 2019-09-12 (×6): 0.4 mg via ORAL
  Filled 2019-09-07 (×6): qty 1

## 2019-09-07 MED ORDER — TORSEMIDE 20 MG PO TABS
60.0000 mg | ORAL_TABLET | Freq: Every day | ORAL | Status: DC
  Administered 2019-09-07 – 2019-09-09 (×3): 60 mg via ORAL
  Filled 2019-09-07 (×3): qty 3

## 2019-09-07 MED ORDER — LISINOPRIL 10 MG PO TABS: 10.0000 mg | ORAL_TABLET | Freq: Every day | ORAL | Status: DC

## 2019-09-07 MED ORDER — NITROGLYCERIN 0.4 MG SL SUBL: 0.4000 mg | SUBLINGUAL_TABLET | SUBLINGUAL | Status: DC | PRN

## 2019-09-07 MED ORDER — PANTOPRAZOLE SODIUM 40 MG PO TBEC
40.0000 mg | DELAYED_RELEASE_TABLET | Freq: Every day | ORAL | Status: DC
  Administered 2019-09-07 – 2019-09-13 (×7): 40 mg via ORAL
  Filled 2019-09-07 (×7): qty 1

## 2019-09-07 MED ORDER — ACETAMINOPHEN 325 MG PO TABS: 650.0000 mg | ORAL_TABLET | Freq: Four times a day (QID) | ORAL | Status: DC | PRN

## 2019-09-07 MED ORDER — APIXABAN 2.5 MG PO TABS
2.5000 mg | ORAL_TABLET | Freq: Two times a day (BID) | ORAL | Status: DC
  Administered 2019-09-07 – 2019-09-13 (×13): 2.5 mg via ORAL
  Filled 2019-09-07 (×17): qty 1

## 2019-09-07 MED ORDER — SODIUM CHLORIDE 0.9% FLUSH
3.0000 mL | Freq: Two times a day (BID) | INTRAVENOUS | Status: DC
  Administered 2019-09-07 – 2019-09-13 (×13): 3 mL via INTRAVENOUS

## 2019-09-07 MED ORDER — INSULIN ASPART 100 UNIT/ML ~~LOC~~ SOLN
3.0000 [IU] | Freq: Three times a day (TID) | SUBCUTANEOUS | Status: DC
  Administered 2019-09-07 – 2019-09-12 (×14): 3 [IU] via SUBCUTANEOUS

## 2019-09-07 MED ORDER — CITALOPRAM HYDROBROMIDE 10 MG PO TABS
20.0000 mg | ORAL_TABLET | Freq: Every day | ORAL | Status: DC
  Administered 2019-09-07 – 2019-09-13 (×7): 20 mg via ORAL
  Filled 2019-09-07 (×7): qty 2

## 2019-09-07 MED ORDER — POTASSIUM CHLORIDE CRYS ER 20 MEQ PO TBCR: 20.0000 meq | EXTENDED_RELEASE_TABLET | ORAL | Status: DC

## 2019-09-07 NOTE — ED Notes (Signed)
Attempted report x1. 

## 2019-09-07 NOTE — ED Notes (Signed)
Ordered breakfast--Burns Timson 

## 2019-09-07 NOTE — ED Notes (Signed)
ED TO INPATIENT HANDOFF REPORT  ED Nurse Name and Phone #: Nohelani Benning 7  S Name/Age/Gender Charles [name] 85 y.o. male Room/Bed: 019C/019C  Code Status   Code Status: DNR  Home/SNF/Other Skilled nursing facility Patient oriented to: self, place and situation Is this baseline? Yes   Triage Complete: Triage complete  Chief Complaint Weakness, Covid   Triage Note Pt BIB [facility] for eval of shortness of breath, hypoxia, progressive weakness in the setting of COVID 19 and PNA from same. Pt has been seen and evluated for covid, fam wants re-evaluated d/t decline. Pt was found by EMS in bathroom _0 %, placed on 4LPM  By EMS w/ improvement to mid 90s. Pt appears tachypneic and dyspneic in triage. +cough.     Allergies Allergies  Allergen Reactions  . Gabapentin Other (See Comments)    Leg pain, weakness   . Metoprolol Swelling  . Spironolactone Other (See Comments)    Painful gynecomastia  . Amlodipine Other (See Comments)    Edema  . Other Other (See Comments)    Horse serum - myalgias  . Rosiglitazone Maleate Other (See Comments)     Did not help  . Tricor [Fenofibrate] Other (See Comments)    myalgias    Level of Care/Admitting Diagnosis ED Disposition    ED Disposition Condition [facility]: [place] [[id number]]  Level of Care: Progressive [102]  Covid Evaluation: Confirmed COVID Positive  Diagnosis: Pneumonia due to COVID-19 virus [[id number]]  Admitting Physician: Norval Morton [[id number]]  Attending Physician: Elmarie Shiley [phone]  Estimated length of stay: 3 - 4 days  Certification:: I certify this patient will need inpatient services for at least 2 midnights  PT Class (Do Not Modify): Inpatient [101]  PT Acc Code (Do Not Modify): Private [1]       B Medical/Surgery History Past Medical History:  Diagnosis Date  . Abdominal aortic atherosclerosis ([place])    by xray  . Arthritis    in lower back (06/24/2015)   . Atrial flutter ([place])    notes 06/24/2015  . AV block, [place] 1 09/06/2017   Archie Endo 09/06/2017  . 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 CAD (coronary artery disease) 2004   a. s/p CABG in 2004 with LIMA-LAD, SVG-RCA, SVG-OM, and SVG-RI  . Carotid stenosis 1999   s/p L CEA  . Chronic diastolic CHF (congestive heart failure) ([place])   . Colon polyp 2005   (Dr. Tiffany Kocher)  . Compression fracture of L1 lumbar vertebra (HCC) remote  . COPD (chronic obstructive pulmonary disease) ([place]) 03/2011   by xray  . Depression    hx  . GERD (gastroesophageal reflux disease) 2003   h/o duodenal ulcer per EGD as well as esophagitis  . History of colon polyps 2003, 2005   adenomatous Vira Agar)  . History of tobacco abuse   . HLD (hyperlipidemia)   . HTN (hypertension)   . Presence of permanent cardiac pacemaker   . Psoriasis   . Renal artery stenosis ([place]) 2004   70% bilateral, followed by cards  . 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 Severe aortic stenosis   . T2DM (type 2 diabetes mellitus) ([place]) 1995  . Urge incontinence of urine    Past Surgical History:  Procedure Laterality Date  . CARDIAC CATHETERIZATION  03/06/2003   No intervention - recommend CABG  . CARDIOVASCULAR STRESS TEST  11/2010   normal perfusion, no evidence of ischemia, EF 62% post exercise  . CARDIOVASCULAR STRESS TEST  11/28/2012   Mild diaphragmatic attenuation; cannot exclude a focal region of nontransmural inferior scar  . CARDIOVERSION N/A 06/25/2015   Procedure: CARDIOVERSION;  Surgeon: Pixie Casino, MD;  Location: [facility] ENDOSCOPY;  Service: Cardiovascular;  Laterality: N/A;  . CARDIOVERSION N/A 11/02/2016   Procedure: CARDIOVERSION;  Surgeon: [name], MD;  Location: [place];  Service: Cardiovascular;  Laterality: N/A;  . CARDIOVERSION N/A 03/20/2019   Procedure:  CARDIOVERSION;  Surgeon: Jerline Pain, MD;  Location: [place];  Service: Cardiovascular;  Laterality: N/A;  . CAROTID ENDARTERECTOMY Left 1999   ([place])  . CATARACT EXTRACTION W/ INTRAOCULAR LENS  IMPLANT, BILATERAL Bilateral 01/2013   [place]  . COLONOSCOPY  2003   colon polyp x3 - adenomatous Tiffany Kocher)  . COLONOSCOPY  10/08/2012   2 TA, diverticulosis, int hem, no rpt rec Tiffany Kocher)  . CORONARY ANGIOPLASTY    . CORONARY ARTERY BYPASS GRAFT  03/07/2003   4v CABG ([place]) with LIMA to LAD, vein graft to RCA, 1st obtuse marginal, and ramus intermedius  . ESOPHAGOGASTRODUODENOSCOPY  10/08/2012   nl esophagus, duodenitis and erosive gastropathy, path - gastropathy no Hpylori, no rpt rec  . LUMBAR EPIDURAL INJECTION  03/2017   L5/S1 ([place])  . PACEMAKER IMPLANT N/A 04/21/2018   Procedure: PACEMAKER IMPLANT -- Dual Chamber;  Surgeon: Deboraha Sprang, MD;  Location: [facility] CV LAB;  Service: Cardiovascular;  Laterality: N/A;  . PERIPHERAL VASCULAR CATHETERIZATION N/A 02/02/2016   Procedure: Renal Angiography;  Surgeon: [name], MD;  Location: [place] CV LAB;  Service: Cardiovascular;  Laterality: N/A;  . PERIPHERAL VASCULAR CATHETERIZATION N/A 02/02/2016   Procedure: Abdominal Aortogram;  Surgeon: [name], MD;  Location: [place] CV LAB;  Service: Cardiovascular;  Laterality: N/A;  . RENAL DOPPLER  11/29/2011   Celiac&SMA-demonstrated vessel narrowing suggestive of a greater than 50% diameter reduction. Bilateral renal arteries-demonstrated vessel narrowing of 60-99% diameter reduction. Rt Kidney-mid pole lateral simple cyst noted measuring 1.29x0.76x1.11cm and exophytic cyst outside lower pole measuring 1.23x0.96x1.31. Lft Kidney-lateral mid to lower pole simple cyst measuering-1.24x9.83x1.24  . RIGHT/LEFT HEART CATH AND CORONARY/GRAFT ANGIOGRAPHY N/A 10/20/2017   Procedure: RIGHT/LEFT HEART CATH AND CORONARY/GRAFT ANGIOGRAPHY;  Surgeon: Burnell Blanks, MD;  Location:  [place] CV LAB;  Service: Cardiovascular;  Laterality: N/A;  . TEE WITHOUT CARDIOVERSION N/A 06/25/2015   Procedure: TRANSESOPHAGEAL ECHOCARDIOGRAM (TEE);  Surgeon: Pixie Casino, MD;  Location: [facility] ENDOSCOPY;  Service: Cardiovascular;  Laterality: N/A;  . TEE WITHOUT CARDIOVERSION N/A 11/02/2016   Procedure: TRANSESOPHAGEAL ECHOCARDIOGRAM (TEE);  Surgeon: [name], MD;  Location: [place];  Service: Cardiovascular;  Laterality: N/A;  . TEE WITHOUT CARDIOVERSION N/A 12/05/2018   Procedure: TRANSESOPHAGEAL ECHOCARDIOGRAM (TEE);  Surgeon: Sherren Mocha, MD;  Location: [place];  Service: Open Heart Surgery;  Laterality: N/A;  . TONSILLECTOMY    . TRANSCATHETER AORTIC VALVE REPLACEMENT, TRANSFEMORAL N/A 12/05/2018   Procedure: TRANSCATHETER AORTIC VALVE REPLACEMENT, TRANSFEMORAL;  Surgeon: Sherren Mocha, MD;  Location: [place];  Service: Open Heart Surgery;  Laterality: N/A;  . UPPER GASTROINTESTINAL ENDOSCOPY  2003   reflux esophagitis, erosive gastropathy, duodenal ulcer     A IV Location/Drains/Wounds Patient Lines/Drains/Airways Status   Active Line/Drains/Airways    Name:   Placement date:   Placement time:   Site:   Days:  Peripheral IV 09/06/19 Left Antecubital   09/06/19    1508    Antecubital   1          Intake/Output Last 24 hours No intake or output data in the 24 hours ending 09/07/19 1819  Labs/Imaging Results for orders placed or performed during the hospital encounter of 09/06/19 (from the past 48 hour(s))  Blood Culture (routine x 2)     Status: None (Preliminary result)   Collection Time: 09/06/19  2:39 PM   Specimen: BLOOD  Result Value Ref Range   Specimen Description BLOOD LEFT ANTECUBITAL    Special Requests      BOTTLES DRAWN AEROBIC AND ANAEROBIC Blood Culture results may not be optimal due to an inadequate volume of blood received in culture bottles   Culture      NO GROWTH < 24 HOURS Performed at [facility] [address],  [place], [place] [address]    Report Status PENDING   Lactic acid, plasma     Status: None   Collection Time: 09/06/19  3:00 PM  Result Value Ref Range   Lactic Acid, Venous 1.8 0.5 - 1.9 mmol/L    Comment: Performed at [facility] [address] [place], [place] [address]  CBC WITH DIFFERENTIAL     Status: Abnormal   Collection Time: 09/06/19  3:00 PM  Result Value Ref Range   WBC 11.2 (H) 4.0 - 10.5 K/uL   RBC 3.50 (L) 4.22 - 5.81 MIL/uL   Hemoglobin 10.4 (L) 13.0 - 17.0 g/dL   HCT 32.8 (L) 39.0 - 52.0 %   MCV 93.7 80.0 - 100.0 fL   MCH 29.7 26.0 - 34.0 pg   MCHC 31.7 30.0 - 36.0 g/dL   RDW 13.7 11.5 - 15.5 %   Platelets 229 150 - 400 K/uL   nRBC 0.0 0.0 - 0.2 %   Neutrophils Relative % 88 %   Neutro Abs 9.9 (H) 1.7 - 7.7 K/uL   Lymphocytes Relative 2 %   Lymphs Abs 0.3 (L) 0.7 - 4.0 K/uL   Monocytes Relative 6 %   Monocytes Absolute 0.6 0.1 - 1.0 K/uL   Eosinophils Relative 0 %   Eosinophils Absolute 0.0 0.0 - 0.5 K/uL   Basophils Relative 0 %   Basophils Absolute 0.0 0.0 - 0.1 K/uL   Immature Granulocytes 4 %   Abs Immature Granulocytes 0.39 (H) 0.00 - 0.07 K/uL    Comment: Performed at [facility] [address], [place], [place] [address]  Comprehensive metabolic panel     Status: Abnormal   Collection Time: 09/06/19  3:00 PM  Result Value Ref Range   Sodium 135 135 - 145 mmol/L   Potassium 4.1 3.5 - 5.1 mmol/L   Chloride 95 (L) 98 - 111 mmol/L   CO2 30 22 - 32 mmol/L   Glucose, Bld 262 (H) 70 - 99 mg/dL   BUN 42 (H) 8 - 23 mg/dL   Creatinine, Ser 1.77 (H) 0.61 - 1.24 mg/dL   Calcium 9.2 8.9 - 10.3 mg/dL   Total Protein 6.9 6.5 - 8.1 g/dL   Albumin 2.9 (L) 3.5 - 5.0 g/dL   AST 28 15 - 41 U/L   ALT 22 0 - 44 U/L   Alkaline Phosphatase 73 38 - 126 U/L   Total Bilirubin 0.6 0.3 - 1.2 mg/dL   GFR calc non Af Amer 34 (L) >60 mL/min   GFR calc Af Amer 40 (  L) >60 mL/min   Anion gap 10 5 - 15    Comment: Performed at [facility] [address], [place], [place] [address]  D-dimer, quantitative     Status: Abnormal   Collection Time: 09/06/19  3:00 PM  Result Value Ref Range   D-Dimer, Quant 1.05 (H) 0.00 - 0.50 ug/mL-FEU    Comment: (NOTE) At the manufacturer cut-off of 0.50 ug/mL FEU, this assay has been documented to exclude PE with a sensitivity and negative predictive value of 97 to 99%.  At this time, this assay has not been approved by the FDA to exclude DVT/VTE. Results should be correlated with clinical presentation. Performed at [facility], [place] [address] [place], [place] [address]   Procalcitonin     Status: None   Collection Time: 09/06/19  3:00 PM  Result Value Ref Range   Procalcitonin 0.16 ng/mL    Comment:        Interpretation: PCT (Procalcitonin) <= 0.5 ng/mL: Systemic infection (sepsis) is not likely. Local bacterial infection is possible. (NOTE)       Sepsis PCT Algorithm           Lower Respiratory Tract                                      Infection PCT Algorithm    ----------------------------     ----------------------------         PCT < 0.25 ng/mL                PCT < 0.10 ng/mL         Strongly encourage             Strongly discourage   discontinuation of antibiotics    initiation of antibiotics    ----------------------------     -----------------------------       PCT 0.25 - 0.50 ng/mL            PCT 0.10 - 0.25 ng/mL               OR       >80% decrease in PCT            Discourage initiation of                                            antibiotics      Encourage discontinuation           of antibiotics    ----------------------------     -----------------------------         PCT >= 0.50 ng/mL              PCT 0.26 - 0.50 ng/mL               AND        <80% decrease in PCT             Encourage initiation of                                             antibiotics       Encourage continuation  of antibiotics    ----------------------------      -----------------------------        PCT >= 0.50 ng/mL                  PCT > 0.50 ng/mL               AND         increase in PCT                  Strongly encourage                                      initiation of antibiotics    Strongly encourage escalation           of antibiotics                                     -----------------------------                                           PCT <= 0.25 ng/mL                                                 OR                                        > 80% decrease in PCT                                     Discontinue / Do not initiate                                             antibiotics Performed at [facility], [address], [name], [place] [address]   Lactate dehydrogenase     Status: Abnormal   Collection Time: 09/06/19  3:00 PM  Result Value Ref Range   LDH 285 (H) 98 - 192 U/L    Comment: Performed at [facility], [place] [address], [place], [place] [address]  Ferritin     Status: Abnormal   Collection Time: 09/06/19  3:00 PM  Result Value Ref Range   Ferritin 397 (H) 24 - 336 ng/mL    Comment: Performed at [facility], [place] [address], [place], [place] [address]  Fibrinogen     Status: Abnormal   Collection Time: 09/06/19  3:00 PM  Result Value Ref Range   Fibrinogen 738 (H) 210 - 475 mg/dL    Comment: Performed at [facility] [address], [place], [place] [address]  C-reactive protein     Status: Abnormal   Collection Time: 09/06/19  3:00 PM  Result Value Ref Range   CRP 9.4 (H) <1.0 mg/dL    Comment: Performed at  [facility], [place] [address], [place], [place] [address]  Triglycerides     Status: Abnormal   Collection Time: 09/06/19  3:00 PM  Result Value Ref Range   Triglycerides 160 (H) <150 mg/dL    Comment: Performed at [facility] [address], [place], [place] [address]  Brain natriuretic peptide     Status: Abnormal   Collection Time: 09/06/19  3:13 PM  Result  Value Ref Range   B Natriuretic Peptide 413.4 (H) 0.0 - 100.0 pg/mL    Comment: Performed at [facility] [address], [place], [place] [address]  POC SARS Coronavirus 2 Ag-ED - Nasal Swab (BD Veritor Kit)     Status: Abnormal   Collection Time: 09/06/19  4:16 PM  Result Value Ref Range   SARS Coronavirus 2 Ag POSITIVE (A) NEGATIVE    Comment: (NOTE) SARS-CoV-2 antigen PRESENT. Positive results indicate the presence of viral antigens, but clinical correlation with patient history and other diagnostic information is necessary to determine patient infection status.  Positive results do not rule out bacterial infection or co-infection  with other viruses. False positive results are rare but can occur, and confirmatory RT-PCR testing may be appropriate in some circumstances. The expected result is Negative. Fact Sheet for Patients: PodPark.tn Fact Sheet for Providers: GiftContent.is  This test is not yet approved or cleared by the [place] FDA and  has been authorized for detection and/or diagnosis of SARS-CoV-2 by FDA under an Emergency Use Authorization (EUA).  This EUA will remain in effect (meaning this test can be used) for the duration of  the COVID-19 declaration under Section 564(b)(1) of the Act, 21 U.S.C. section 360bbb-3(b)(1), unless the a uthorization is terminated or revoked sooner.   Lactic acid, plasma     Status: None   Collection Time: 09/06/19  4:34 PM  Result Value Ref Range   Lactic Acid, Venous 1.2 0.5 - 1.9 mmol/L    Comment: Performed at [facility] [address] [place], [place] [address]  CBG monitoring, ED     Status: Abnormal   Collection Time: 09/06/19  8:11 PM  Result Value Ref Range   Glucose-Capillary 254 (H) 70 - 99 mg/dL  CBG monitoring, ED     Status: Abnormal   Collection Time: 09/06/19  9:46 PM  Result Value Ref Range   Glucose-Capillary 238 (H) 70 - 99 mg/dL   CBG monitoring, ED     Status: Abnormal   Collection Time: 09/07/19  2:32 AM  Result Value Ref Range   Glucose-Capillary 176 (H) 70 - 99 mg/dL  CBG monitoring, ED     Status: Abnormal   Collection Time: 09/07/19  3:47 AM  Result Value Ref Range   Glucose-Capillary 173 (H) 70 - 99 mg/dL  Culture, blood (single) w Reflex to ID Panel     Status: None (Preliminary result)   Collection Time: 09/07/19  4:55 AM   Specimen: BLOOD  Result Value Ref Range   Specimen Description BLOOD RIGHT ARM    Special Requests      BOTTLES DRAWN AEROBIC AND ANAEROBIC Blood Culture adequate volume   Culture      NO GROWTH <12 HOURS Performed at [facility], [address] [place], [place] [address]    Report Status PENDING   Hemoglobin A1c     Status: Abnormal   Collection Time: 09/07/19  6:29 AM  Result Value Ref Range   Hgb A1c  MFr Bld 9.0 (H) 4.8 - 5.6 %    Comment: (NOTE) Pre diabetes:          5.7%-6.4% Diabetes:              >6.4% Glycemic control for   <7.0% adults with diabetes    Mean Plasma Glucose 211.6 mg/dL    Comment: Performed at [facility] [address] [place], [place] [address]  CBC with Differential/Platelet     Status: Abnormal   Collection Time: 09/07/19  6:29 AM  Result Value Ref Range   WBC 9.8 4.0 - 10.5 K/uL   RBC 3.42 (L) 4.22 - 5.81 MIL/uL   Hemoglobin 10.0 (L) 13.0 - 17.0 g/dL   HCT 32.0 (L) 39.0 - 52.0 %   MCV 93.6 80.0 - 100.0 fL   MCH 29.2 26.0 - 34.0 pg   MCHC 31.3 30.0 - 36.0 g/dL   RDW 13.8 11.5 - 15.5 %   Platelets 210 150 - 400 K/uL   nRBC 0.0 0.0 - 0.2 %   Neutrophils Relative % 88 %   Neutro Abs 8.6 (H) 1.7 - 7.7 K/uL   Lymphocytes Relative 3 %   Lymphs Abs 0.3 (L) 0.7 - 4.0 K/uL   Monocytes Relative 6 %   Monocytes Absolute 0.6 0.1 - 1.0 K/uL   Eosinophils Relative 0 %   Eosinophils Absolute 0.0 0.0 - 0.5 K/uL   Basophils Relative 0 %   Basophils Absolute 0.0 0.0 - 0.1 K/uL   Immature Granulocytes 3 %   Abs Immature  Granulocytes 0.31 (H) 0.00 - 0.07 K/uL    Comment: Performed at [facility], [place] [address], [place], [place] [address]  Comprehensive metabolic panel     Status: Abnormal   Collection Time: 09/07/19  6:29 AM  Result Value Ref Range   Sodium 139 135 - 145 mmol/L   Potassium 4.2 3.5 - 5.1 mmol/L   Chloride 97 (L) 98 - 111 mmol/L   CO2 32 22 - 32 mmol/L   Glucose, Bld 161 (H) 70 - 99 mg/dL   BUN 45 (H) 8 - 23 mg/dL   Creatinine, Ser 1.71 (H) 0.61 - 1.24 mg/dL   Calcium 9.4 8.9 - 10.3 mg/dL   Total Protein 6.6 6.5 - 8.1 g/dL   Albumin 2.7 (L) 3.5 - 5.0 g/dL   AST 23 15 - 41 U/L   ALT 19 0 - 44 U/L   Alkaline Phosphatase 70 38 - 126 U/L   Total Bilirubin 0.6 0.3 - 1.2 mg/dL   GFR calc non Af Amer 36 (L) >60 mL/min   GFR calc Af Amer 41 (L) >60 mL/min   Anion gap 10 5 - 15    Comment: Performed at [facility], [place] [address], [place], [place] [address]  D-dimer, quantitative (not at [facility])     Status: Abnormal   Collection Time: 09/07/19  6:29 AM  Result Value Ref Range   D-Dimer, Quant 0.91 (H) 0.00 - 0.50 ug/mL-FEU    Comment: (NOTE) At the manufacturer cut-off of 0.50 ug/mL FEU, this assay has been documented to exclude PE with a sensitivity and negative predictive value of 97 to 99%.  At this time, this assay has not been approved by the FDA to exclude DVT/VTE. Results should be correlated with clinical presentation. Performed at [facility], [place] [address] [place], [place] [address]   Ferritin     Status: Abnormal   Collection Time:  09/07/19  6:29 AM  Result Value Ref Range   Ferritin 411 (H) 24 - 336 ng/mL    Comment: Performed at [facility] [address] [place], [place] [address]  C-reactive protein     Status: Abnormal   Collection Time: 09/07/19  6:29 AM  Result Value Ref Range   CRP 11.8 (H) <1.0 mg/dL    Comment: Performed at [facility] [address], [place], [place] [address]  CBG monitoring, ED     Status: Abnormal    Collection Time: 09/07/19  8:19 AM  Result Value Ref Range   Glucose-Capillary 143 (H) 70 - 99 mg/dL  CBG monitoring, ED     Status: Abnormal   Collection Time: 09/07/19 12:04 PM  Result Value Ref Range   Glucose-Capillary 280 (H) 70 - 99 mg/dL  CBG monitoring, ED     Status: Abnormal   Collection Time: 09/07/19  4:37 PM  Result Value Ref Range   Glucose-Capillary 294 (H) 70 - 99 mg/dL   Dg Chest Port 1 View  Result Date: 09/06/2019 CLINICAL DATA:  Shortness of breath, chest pain, positive COVID EXAM: PORTABLE CHEST 1 VIEW COMPARISON:  08/13/2019 FINDINGS: Patchy density primarily at the left greater than right lung bases. Small left pleural effusion. No pneumothorax. Stable cardiomediastinal contours. IMPRESSION: Left greater than right basilar pneumonia and atelectasis. Small left pleural effusion. Electronically Signed   By: Macy Mis M.D.   On: 09/06/2019 15:00    Pending Labs Unresulted Labs (From admission, onward)    Start     Ordered   09/07/19 0500  CBC with Differential/Platelet  Daily,   R     09/07/19 0454   09/07/19 0500  Comprehensive metabolic panel  Daily,   R     09/07/19 0454   09/07/19 0500  D-dimer, quantitative (not at [facility])  Daily,   R     09/07/19 0454   09/07/19 0500  Ferritin  Daily,   R     09/07/19 0454   09/07/19 0500  C-reactive protein  Daily,   R     09/07/19 0454   09/06/19 1434  Blood Culture (routine x 2)  BLOOD CULTURE X 2,   STAT     09/06/19 1434          Vitals/Pain Today's Vitals   09/07/19 1515 09/07/19 1530 09/07/19 1545 09/07/19 1600  BP: 122/61 (!) 125/53 (!) 116/53 (!) 117/54  Pulse: 82 79 81 78  Resp: (!) 25 (!) 24 (!) 22 (!) 23  Temp:      TempSrc:      SpO2: 99% 100% 97% 98%  Weight:      Height:      PainSc:        Isolation Precautions Airborne and Contact precautions  Medications Medications  insulin aspart (novoLOG) injection 5 Units (5 Units Intravenous Not Given 09/06/19 2029)  amiodarone (PACERONE)  tablet 200 mg (200 mg Oral Given 09/07/19 1056)  atorvastatin (LIPITOR) tablet 20 mg (20 mg Oral Given 09/07/19 1056)  nitroGLYCERIN (NITROSTAT) SL tablet 0.4 mg (has no administration in time range)  torsemide (DEMADEX) tablet 60 mg (60 mg Oral Given 09/07/19 1056)  mometasone-formoterol (DULERA) 200-5 MCG/ACT inhaler 2 puff (2 puffs Inhalation Given 09/07/19 1212)  apixaban (ELIQUIS) tablet 2.5 mg (2.5 mg Oral Given 09/07/19 1213)  finasteride (PROSCAR) tablet 5 mg (5 mg Oral Given 09/07/19 1213)  tamsulosin (FLOMAX) capsule 0.4 mg (0.4 mg Oral Given 09/07/19 1815)  citalopram (CELEXA) tablet 20 mg (20 mg Oral Given 09/07/19 1057)  traZODone (DESYREL) tablet 50 mg (has no administration in time range)  pantoprazole (PROTONIX) EC tablet 40 mg (40 mg Oral Given 09/07/19 1056)  sodium chloride flush (NS) 0.9 % injection 3 mL (3 mLs Intravenous Given 09/07/19 1215)  dexamethasone (DECADRON) tablet 6 mg (6 mg Oral Given 09/07/19 0609)  albuterol (VENTOLIN HFA) 108 (90 Base) MCG/ACT inhaler 2 puff (2 puffs Inhalation Given 09/07/19 1418)  acetaminophen (TYLENOL) tablet 650 mg (has no administration in time range)  polyethylene glycol (MIRALAX / GLYCOLAX) packet 17 g (has no administration in time range)  multivitamin with minerals tablet 1 tablet (1 tablet Oral Given 09/07/19 1056)  remdesivir 200 mg in sodium chloride 0.9% 250 mL IVPB (0 mg Intravenous Stopped 09/06/19 2112)    Followed by  remdesivir 100 mg in sodium chloride 0.9 % 100 mL IVPB (0 mg Intravenous Stopped 09/07/19 1214)  insulin aspart (novoLOG) injection 0-9 Units (5 Units Subcutaneous Given 09/07/19 1738)  insulin detemir (LEVEMIR) injection 12 Units (has no administration in time range)  insulin aspart (novoLOG) injection 3 Units (3 Units Subcutaneous Given 09/07/19 1815)  dextromethorphan-guaiFENesin (MUCINEX DM) 30-600 MG per 12 hr tablet 1 tablet (1 tablet Oral Given 09/07/19 1737)  albuterol (VENTOLIN HFA) 108 (90 Base) MCG/ACT inhaler 8  puff (8 puffs Inhalation Given 09/06/19 1639)  dexamethasone (DECADRON) tablet 6 mg (6 mg Oral Given 09/06/19 2027)    Mobility walks with person assist High fall risk   Focused Assessments Pulmonary Assessment Handoff:  Lung sounds: Bilateral Breath Sounds: Diminished L Breath Sounds: Expiratory wheezes R Breath Sounds: Expiratory wheezes O2 Device: Nasal Cannula O2 Flow Rate (L/min): 4 L/min      R Recommendations: See Admitting Provider Note  Report given to:   Additional Notes:  covid +, 4 lpm o2 via [place]

## 2019-09-07 NOTE — ED Notes (Signed)
[facility] (called/Canceled/Transfer to [facility]) @ 1903-per Charge called by Levada Dy

## 2019-09-07 NOTE — ED Notes (Signed)
Lunch Tray Ordered @ 1020.  

## 2019-09-07 NOTE — ED Notes (Signed)
[facility] called/Transfer to Dwan Bolt, RN Charge called by Levada Dy

## 2019-09-07 NOTE — ED Notes (Signed)
[facility]-(called/Transfer to [facility]) @ 1822-per Lennette Bihari, RN Charge called by Levada Dy

## 2019-09-07 NOTE — ED Notes (Signed)
Pt in transport to [facility] via [place]. Report given to Jeannine Kitten, and [place], [place].

## 2019-09-07 NOTE — Progress Notes (Signed)
Inpatient Diabetes Program Recommendations  AACE/ADA: New Consensus Statement on Inpatient Glycemic Control (2015)  Target Ranges:  Prepandial:   less than 140 mg/dL      Peak postprandial:   less than 180 mg/dL (1-2 hours)      Critically ill patients:  140 - 180 mg/dL   Lab Results  Component Value Date   GLUCAP 280 (H) 09/07/2019   HGBA1C 9.0 (H) 09/07/2019    Review of Glycemic Control Results for HULEY, JUPIN (MRN [id number]) as of 09/07/2019 13:51  Ref. Range 09/06/2019 21:46 09/07/2019 02:32 09/07/2019 03:47 09/07/2019 08:19 09/07/2019 12:04  Glucose-Capillary Latest Ref Range: 70 - 99 mg/dL 238 (H) 176 (H) 173 (H) 143 (H) 280 (H)   Diabetes history: DM 2 Outpatient Diabetes medications:  Lantus 22 units q AM and Lantus 20 units q PM Current orders for Inpatient glycemic control:  Novolog sensitive q 4 hours Levemir 8 units bid Inpatient Diabetes Program Recommendations:   Consider increasing Levemir to 10 units bid.  Also consider adding Novolog meal coverage 3 units tid with meals (hold if patient eats <50% OR NPO).   Thanks,  Adah Perl, RN, BC-ADM Inpatient Diabetes Coordinator Pager [phone] (8a-5p)

## 2019-09-07 NOTE — Progress Notes (Addendum)
PROGRESS NOTE    Charles [name]  [id number] DOB: 1932/09/27 DOA: 09/06/2019 PCP: [name], MD   Brief Narrative: 85 year old with past medical history significant for CAD status post CABG, chronic combined systolic and diastolic heart failure, A. flutter status post PPM, AAA, COPD on 2 L of oxygen at home, severe aortic stenosis status post TAVR on Eliquis, hypertension, diabetes, recently discharged from the hospital on 11/16 treated at that time for combined systolic and diastolic heart failure exacerbation, discharged to SNF  who presents with worsening SOB.   Patient was found to be hypoxic, oxygen saturation 88 and he was placed on 4 L of oxygen by EMS.  Per family report patient was placed on antibiotics recently for pneumonia and he was presumably diagnosed with Covid past Sunday (11/29).  Evaluation in the ED chest x-ray showed left greater than right bibasilar pneumonia and atelectasis with a small left pleural effusion.  BNP 413, SARS Cov 2 positive.   Assessment & Plan:   Active Problems:   COVID-19   Pneumonia due to COVID-19 virus   1-Acute on chronic hypoxic respiratory failure secondary to COVID-19 pneumonia: -On chronic 2 L of oxygen at baseline, currently requiring 4 L. -Present hypoxic, chest x-ray with bibasilar infiltrates. -Continue with Remdesivir day 2 -Continue with Decadron daily for 10 days.  -Continue with Albuterol schedule, Dulera. COVID-19 Labs  Recent Labs    09/06/19 1500 09/07/19 0629  DDIMER 1.05* 0.91*  FERRITIN 397* 411*  LDH 285*  --   CRP 9.4* 11.8*   SARS Coronavirus antigen positive.   2-CAD; Continue with PRN nitroglycerin, lipitor.   3-Chronic combined systolic and diastolic heart failure; Appears compensated. Continue with torsemide Will replete potassium as needed We will hold lisinopril to avoid worsening renal function in the setting of infection  4-A flutter: s/p PPM -Continue with amiodarone and Eliquis   5-Diabetes, poorly controlled;HB A1c at 9.  Will increase Levemir to 12 units BID, he uses 20 BID at home./  Will add meals coverage 3 units.   6-SIRS secondary to COVID-19.  7-Chronic kidney disease a stage III: Creatinine baseline 1.4--1.7 Stable.  Monitor on Lasix  8-COPD on chronic 2 L of oxygen: Continue with inhalers    Estimated body mass index is 29.52 kg/m as calculated from the following:   Height as of this encounter: 6\' 1"  (1.854 m).   Weight as of this encounter: 101.5 kg.   DVT prophylaxis: Eliquis Code Status: DNR Family Communication:care discussed with patient. Daughter in law updated.  Disposition Plan: Patient transferred to [facility], remain in the hospital for treatment of respiratory failure secondary to COVID-19 pneumonia.  He will require dexamethasone and remdesivir Consultants:   None  Procedures:   None  Antimicrobials: Others Remdesivir 12-03  Subjective: He is feeling weak and tired, he is lying down in bed appears ill appearing. Denies worsening shortness of breath.  Objective: Vitals:   09/07/19 1145 09/07/19 1230 09/07/19 1315 09/07/19 1345  BP: 117/61 128/65 117/70 126/62  Pulse: 95 78 84 80  Resp: (!) 23 (!) 25 (!) 23 (!) 22  Temp:      TempSrc:      SpO2: (!) 88% 97% 97% 98%  Weight:      Height:       No intake or output data in the 24 hours ending 09/07/19 1401 Filed Weights   09/06/19 1411  Weight: 101.5 kg    Examination:  General exam: No acute distress, ill appearing  Respiratory system: Distant lung sounds. Respiratory effort normal. Cardiovascular system: S1 & S2 heard, RRR. Gastrointestinal system: Abdomen is nondistended, soft and nontender. No organomegaly or masses felt. Normal bowel sounds heard. Central nervous system: Alert and oriented. No focal neurological deficits. Extremities: Symmetric 5 x 5 power. Skin: No rashes, lesions or ulcers Psychiatry: Judgement and insight appear normal. Mood  & affect appropriate.     Data Reviewed: I have personally reviewed following labs and imaging studies  CBC: Recent Labs  Lab 09/06/19 1500 09/07/19 0629  WBC 11.2* 9.8  NEUTROABS 9.9* 8.6*  HGB 10.4* 10.0*  HCT 32.8* 32.0*  MCV 93.7 93.6  PLT 229 [id number]   Basic Metabolic Panel: Recent Labs  Lab 09/06/19 1500 09/07/19 0629  NA 135 139  K 4.1 4.2  CL 95* 97*  CO2 30 32  GLUCOSE 262* 161*  BUN 42* 45*  CREATININE 1.77* 1.71*  CALCIUM 9.2 9.4   GFR: Estimated Creatinine Clearance: 39.5 mL/min (A) (by C-G formula based on SCr of 1.71 mg/dL (H)). Liver Function Tests: Recent Labs  Lab 09/06/19 1500 09/07/19 0629  AST 28 23  ALT 22 19  ALKPHOS 73 70  BILITOT 0.6 0.6  PROT 6.9 6.6  ALBUMIN 2.9* 2.7*   No results for input(s): LIPASE, AMYLASE in the last 168 hours. No results for input(s): AMMONIA in the last 168 hours. Coagulation Profile: No results for input(s): INR, PROTIME in the last 168 hours. Cardiac Enzymes: No results for input(s): CKTOTAL, CKMB, CKMBINDEX, TROPONINI in the last 168 hours. BNP (last 3 results) Recent Labs    01/26/19 0000  PROBNP 1,162*   HbA1C: Recent Labs    09/07/19 0629  HGBA1C 9.0*   CBG: Recent Labs  Lab 09/06/19 2146 09/07/19 0232 09/07/19 0347 09/07/19 0819 09/07/19 1204  GLUCAP 238* 176* 173* 143* 280*   Lipid Profile: Recent Labs    09/06/19 1500  TRIG 160*   Thyroid Function Tests: No results for input(s): TSH, T4TOTAL, FREET4, T3FREE, THYROIDAB in the last 72 hours. Anemia Panel: Recent Labs    09/06/19 1500 09/07/19 0629  FERRITIN 397* 411*   Sepsis Labs: Recent Labs  Lab 09/06/19 1500 09/06/19 1634  PROCALCITON 0.16  --   LATICACIDVEN 1.8 1.2    Recent Results (from the past 240 hour(s))  Blood Culture (routine x 2)     Status: None (Preliminary result)   Collection Time: 09/06/19  2:39 PM   Specimen: BLOOD  Result Value Ref Range Status   Specimen Description BLOOD LEFT ANTECUBITAL   Final   Special Requests   Final    BOTTLES DRAWN AEROBIC AND ANAEROBIC Blood Culture results may not be optimal due to an inadequate volume of blood received in culture bottles   Culture   Final    NO GROWTH < 24 HOURS Performed at [facility], [place] [address], [place], [place] [address]    Report Status PENDING  Incomplete  Culture, blood (single) w Reflex to ID Panel     Status: None (Preliminary result)   Collection Time: 09/07/19  4:55 AM   Specimen: BLOOD  Result Value Ref Range Status   Specimen Description BLOOD RIGHT ARM  Final   Special Requests   Final    BOTTLES DRAWN AEROBIC AND ANAEROBIC Blood Culture adequate volume   Culture   Final    NO GROWTH <12 HOURS Performed at [facility], [place] [address] [place], [place] [address]    Report Status PENDING  Incomplete         Radiology Studies: Dg Chest Port 1 View  Result Date: 09/06/2019 CLINICAL DATA:  Shortness of breath, chest pain, positive COVID EXAM: PORTABLE CHEST 1 VIEW COMPARISON:  08/13/2019 FINDINGS: Patchy density primarily at the left greater than right lung bases. Small left pleural effusion. No pneumothorax. Stable cardiomediastinal contours. IMPRESSION: Left greater than right basilar pneumonia and atelectasis. Small left pleural effusion. Electronically Signed   By: Macy Mis M.D.   On: 09/06/2019 15:00        Scheduled Meds: . albuterol  2 puff Inhalation Q6H  . amiodarone  200 mg Oral Daily  . apixaban  2.5 mg Oral BID  . atorvastatin  20 mg Oral Daily  . citalopram  20 mg Oral Daily  . dexamethasone  6 mg Oral Q24H  . finasteride  5 mg Oral Daily  . insulin aspart  0-9 Units Subcutaneous Q4H  . insulin aspart  5 Units Intravenous Once  . insulin detemir  0.075 Units/kg Subcutaneous BID  . mometasone-formoterol  2 puff Inhalation BID  . multivitamin with minerals  1 tablet Oral Daily  . pantoprazole  40 mg Oral Daily  . sodium chloride flush  3 mL Intravenous Q12H  .  tamsulosin  0.4 mg Oral QPC supper  . torsemide  60 mg Oral Daily  . traZODone  50 mg Oral QHS   Continuous Infusions: . remdesivir 100 mg in NS 100 mL Stopped (09/07/19 1214)     LOS: 1 day    Time spent: 35 minutes    Maddyn Lieurance A Darcus Edds, MD [facility]   If 7PM-7AM, please contact night-coverage [email or web address] Password [email or web address] 09/07/2019, 2:01 PM

## 2019-09-08 DIAGNOSIS — E118 Type 2 diabetes mellitus with unspecified complications: Secondary | ICD-10-CM

## 2019-09-08 DIAGNOSIS — J9601 Acute respiratory failure with hypoxia: Secondary | ICD-10-CM

## 2019-09-08 DIAGNOSIS — Z66 Do not resuscitate: Secondary | ICD-10-CM

## 2019-09-08 DIAGNOSIS — J9691 Respiratory failure, unspecified with hypoxia: Secondary | ICD-10-CM

## 2019-09-08 DIAGNOSIS — J1289 Other viral pneumonia: Secondary | ICD-10-CM

## 2019-09-08 DIAGNOSIS — E7800 Pure hypercholesterolemia, unspecified: Secondary | ICD-10-CM

## 2019-09-08 DIAGNOSIS — N182 Chronic kidney disease, stage 2 (mild): Secondary | ICD-10-CM

## 2019-09-08 LAB — COMPREHENSIVE METABOLIC PANEL
ALT: 17 U/L (ref 0–44)
AST: 22 U/L (ref 15–41)
Albumin: 2.7 g/dL — ABNORMAL LOW (ref 3.5–5.0)
Alkaline Phosphatase: 64 U/L (ref 38–126)
Anion gap: 13 (ref 5–15)
BUN: 59 mg/dL — ABNORMAL HIGH (ref 8–23)
CO2: 32 mmol/L (ref 22–32)
Calcium: 8.9 mg/dL (ref 8.9–10.3)
Chloride: 94 mmol/L — ABNORMAL LOW (ref 98–111)
Creatinine, Ser: 1.71 mg/dL — ABNORMAL HIGH (ref 0.61–1.24)
GFR calc Af Amer: 41 mL/min — ABNORMAL LOW (ref >60)
GFR calc non Af Amer: 36 mL/min — ABNORMAL LOW (ref >60)
Glucose, Bld: 207 mg/dL — ABNORMAL HIGH (ref 70–99)
Potassium: 3.8 mmol/L (ref 3.5–5.1)
Sodium: 139 mmol/L (ref 135–145)
Total Bilirubin: 0.7 mg/dL (ref 0.3–1.2)
Total Protein: 6.5 g/dL (ref 6.5–8.1)

## 2019-09-08 LAB — CBC WITH DIFFERENTIAL/PLATELET
Abs Immature Granulocytes: 0.33 10*3/uL — ABNORMAL HIGH (ref 0.00–0.07)
Basophils Absolute: 0 10*3/uL (ref 0.0–0.1)
Basophils Relative: 0 %
Eosinophils Absolute: 0 10*3/uL (ref 0.0–0.5)
Eosinophils Relative: 0 %
HCT: 30.7 % — ABNORMAL LOW (ref 39.0–52.0)
Hemoglobin: 9.5 g/dL — ABNORMAL LOW (ref 13.0–17.0)
Immature Granulocytes: 4 %
Lymphocytes Relative: 5 %
Lymphs Abs: 0.4 10*3/uL — ABNORMAL LOW (ref 0.7–4.0)
MCH: 29.1 pg (ref 26.0–34.0)
MCHC: 30.9 g/dL (ref 30.0–36.0)
MCV: 93.9 fL (ref 80.0–100.0)
Monocytes Absolute: 0.7 10*3/uL (ref 0.1–1.0)
Monocytes Relative: 8 %
Neutro Abs: 6.9 10*3/uL (ref 1.7–7.7)
Neutrophils Relative %: 83 %
Platelets: 222 10*3/uL (ref 150–400)
RBC: 3.27 MIL/uL — ABNORMAL LOW (ref 4.22–5.81)
RDW: 13.7 % (ref 11.5–15.5)
WBC: 8.4 10*3/uL (ref 4.0–10.5)
nRBC: 0 % (ref 0.0–0.2)

## 2019-09-08 LAB — GLUCOSE, CAPILLARY
Glucose-Capillary: 135 mg/dL — ABNORMAL HIGH (ref 70–99)
Glucose-Capillary: 214 mg/dL — ABNORMAL HIGH (ref 70–99)
Glucose-Capillary: 219 mg/dL — ABNORMAL HIGH (ref 70–99)
Glucose-Capillary: 363 mg/dL — ABNORMAL HIGH (ref 70–99)
Glucose-Capillary: 391 mg/dL — ABNORMAL HIGH (ref 70–99)
Glucose-Capillary: 503 mg/dL (ref 70–99)
Glucose-Capillary: 557 mg/dL (ref 70–99)

## 2019-09-08 LAB — D-DIMER, QUANTITATIVE: D-Dimer, Quant: 0.88 ug/mL-FEU — ABNORMAL HIGH (ref 0.00–0.50)

## 2019-09-08 LAB — C-REACTIVE PROTEIN: CRP: 9.7 mg/dL — ABNORMAL HIGH (ref <1.0)

## 2019-09-08 LAB — FERRITIN: Ferritin: 430 ng/mL — ABNORMAL HIGH (ref 24–336)

## 2019-09-08 MED ORDER — INSULIN ASPART 100 UNIT/ML ~~LOC~~ SOLN
10.0000 [IU] | Freq: Once | SUBCUTANEOUS | Status: AC
  Administered 2019-09-08: 10 [IU] via SUBCUTANEOUS

## 2019-09-08 MED ORDER — INSULIN DETEMIR 100 UNIT/ML ~~LOC~~ SOLN
15.0000 [IU] | Freq: Two times a day (BID) | SUBCUTANEOUS | Status: DC
  Filled 2019-09-08: qty 0.15

## 2019-09-08 MED ORDER — INSULIN DETEMIR 100 UNIT/ML ~~LOC~~ SOLN
8.0000 [IU] | Freq: Once | SUBCUTANEOUS | Status: AC
  Administered 2019-09-08: 8 [IU] via SUBCUTANEOUS
  Filled 2019-09-08: qty 0.08

## 2019-09-08 MED ORDER — INSULIN DETEMIR 100 UNIT/ML ~~LOC~~ SOLN
20.0000 [IU] | Freq: Two times a day (BID) | SUBCUTANEOUS | Status: DC
  Administered 2019-09-08 – 2019-09-09 (×2): 20 [IU] via SUBCUTANEOUS
  Filled 2019-09-08 (×3): qty 0.2

## 2019-09-08 MED ORDER — HYDROCOD POLST-CPM POLST ER 10-8 MG/5ML PO SUER
5.0000 mL | Freq: Two times a day (BID) | ORAL | Status: DC
  Administered 2019-09-08 – 2019-09-13 (×10): 5 mL via ORAL
  Filled 2019-09-08 (×11): qty 5

## 2019-09-08 NOTE — Progress Notes (Signed)
PROGRESS NOTE    Charles [name]  [id number] DOB: 08/08/1932 DOA: 09/06/2019 PCP: [name], MD    Brief Narrative:  85 year old male who presented with dyspnea.  He does have significant past medical history for coronary artery disease status post CABG, systolic heart failure, atrial flutter, aortic abdominal aneurysm, severe aortic stenosis status post TAVR, hypertension, dyslipidemia, type 2 diabetes mellitus and chronic hypoxic respiratory failure (2 L/min per nasal cannula) due to COPD. Patient had recent hospitalization 11/06, for decompensated heart failure, discharge he went to a skilled nursing facility where he tested positive for COVID-19. Patient reported progressive dyspnea, his oxygen saturation was 88% on supplemental oxygen, apparently about a week prior to hospitalization he had been on antibiotic for diagnosis of pneumonia. On his initial physical examination his blood pressure 141/69, heart rate 85, temperature 99.3, respiratory rate 30, oxygen saturation 94%, patient had no signs of respiratory distress, heart S1-S2 present, rhythmic, abdomen soft, no lower extremity edema. Sodium 135, potassium 4.1, chloride 95, bicarb 30, glucose 262, BUN 42, creatinine 1.7, white count 11.2, hemoglobin 10.4, hematocrit 32.8, platelets 229.  SARS COVID-19 positive.  His chest x-ray had a right lower lobe atelectasis, left lower lobe interstitial infiltrate.  EKG 91 bpm, rightward axis, prolonged QRS with a sense and V paced, no ST segment changes or T wave abnormalities.  Patient was admitted to the hospital with a working diagnosis of acute hypoxic respiratory failure due to SARS COVID-19 viral pneumonia  Patient has been responding well to medical therapy with remdesivir, and systemic corticosteroids with dexamethasone.  He has been transferred to [facility] on December 4, to continue management of respiratory failure.  Assessment & Plan:   Principal Problem:  Pneumonia due to COVID-19 virus Active Problems:   Diabetes mellitus type 2, uncontrolled, with complications (HCC)   HYPERCHOLESTEROLEMIA   Paroxysmal atrial flutter (HCC)   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 [employer]   S/P TAVR (transcatheter aortic valve replacement)   CKD (chronic kidney disease), stage II   COVID-19   Respiratory failure with hypoxia ([place])   DNR (do not resuscitate) present on admission   1.  Acute hypoxic respiratory failure due to SARS COVID-19 viral pneumonia.  RR: 16  Pulse oxymetry: 96%  Fi02: 3 LPM per [place]  COVID-19 Labs  Recent Labs    09/06/19 1500 09/07/19 0629 09/08/19 0234  DDIMER 1.05* 0.91* 0.88*  FERRITIN 397* 411* 430*  LDH 285*  --   --   CRP 9.4* 11.8* 9.7*    Lab Results  Component Value Date   SARSCOV2NAA NEGATIVE 08/19/2019   SARSCOV2NAA NEGATIVE 08/13/2019   SARSCOV2NAA NEGATIVE 08/10/2019   [place] NEGATIVE 06/25/2019   Inflammatory markers are trending down.    Continue medical therapy with Remdesivir #3/5. Systemic corticosteroids with dexamethasone 6 mg daily, antitussive agents and airway clearing techniques with flutter valve and incentive spirometer.   Out of bed to chair tid and physical therapy evaliation.   2. Chronic systolic heart failure. No signs of clinical decompensation, continue diuretic therapy with torsemide. Continue blood pressure monitoring.   3. Paroxysmal atrial flutter, sp pacer. Patient on amiodarone and apixaban for anticoagulation.   4. Uncontrolled T2DM ( Hgb a1c 9) with steroid induced hyperglycemia. Dyslipidemia. Fasting glucose is 207 this am, will continue glucose cover and monitoring with insulin sliding scale and will increase basal insulin to 15 bid, patient used 17 units of insulin aspart coverage per sliding scale yesterday. Continue with atorvastatin.   5.  CKD stage 3b . His renal function has remain stable with serum cr at 1,7 with k at 3,8 and serum bicarbonate at 32. Will  continue to follow on renal panel in am, avoid hypotension and nephrotoxic medications, continue with torsemide.   6. COPD with chronic hypoxic respiratory failure. No signs of acute exacerbation, continue with bronchodilator therapy. Continue with mometasone and formoterol.    DVT prophylaxis: enoxaparin   Code Status:  DNR  Family Communication: No family at the bedside  Disposition Plan/ discharge barriers: pending clinical improvement.   Body mass index is 28.68 kg/m. Malnutrition Type:      Malnutrition Characteristics:      Nutrition Interventions:     RN Pressure Injury Documentation:     Consultants:     Procedures:     Antimicrobials:       Subjective: Patient feeling better, but continue to be very weak and deconditioned, has not been out of bed yet, no nausea or vomiting.   Objective: Vitals:   09/08/19 0000 09/08/19 0313 09/08/19 0400 09/08/19 0500  BP: (!) 106/42 (!) 109/51 (!) 107/53   Pulse: 73 77 71   Resp: 16 16    Temp: 98.8 F (37.1 C) 98.8 F (37.1 C)    TempSrc: Oral Oral    SpO2: 96% 96% 95%   Weight:    98.6 kg  Height:        Intake/Output Summary (Last 24 hours) at 09/08/2019 0801 Last data filed at 09/08/2019 0500 Gross per 24 hour  Intake 460.88 ml  Output 825 ml  Net -364.12 ml   Filed Weights   09/06/19 1411 09/08/19 0500  Weight: 101.5 kg 98.6 kg    Examination:   General: Not in pain or dyspnea, deconditioned  Neurology: Awake and alert, non focal  E ENT: mild pallor, no icterus, oral mucosa moist Cardiovascular: No JVD. S1-S2 present, rhythmic, no gallops, rubs, or murmurs. No lower extremity edema. Pulmonary: positive breath sounds bilaterally. Gastrointestinal. Abdomen with no organomegaly, non tender, no rebound or guarding Skin. No rashes Musculoskeletal: no joint deformities     Data Reviewed: I have personally reviewed following labs and imaging studies  CBC: Recent Labs  Lab 09/06/19 1500  09/07/19 0629 09/08/19 0234  WBC 11.2* 9.8 8.4  NEUTROABS 9.9* 8.6* 6.9  HGB 10.4* 10.0* 9.5*  HCT 32.8* 32.0* 30.7*  MCV 93.7 93.6 93.9  PLT 229 210 [id number]   Basic Metabolic Panel: Recent Labs  Lab 09/06/19 1500 09/07/19 0629 09/08/19 0234  NA 135 139 139  K 4.1 4.2 3.8  CL 95* 97* 94*  CO2 30 32 32  GLUCOSE 262* 161* 207*  BUN 42* 45* 59*  CREATININE 1.77* 1.71* 1.71*  CALCIUM 9.2 9.4 8.9   GFR: Estimated Creatinine Clearance: 39 mL/min (A) (by C-G formula based on SCr of 1.71 mg/dL (H)). Liver Function Tests: Recent Labs  Lab 09/06/19 1500 09/07/19 0629 09/08/19 0234  AST 28 23 22   ALT 22 19 17   ALKPHOS 73 70 64  BILITOT 0.6 0.6 0.7  PROT 6.9 6.6 6.5  ALBUMIN 2.9* 2.7* 2.7*   No results for input(s): LIPASE, AMYLASE in the last 168 hours. No results for input(s): AMMONIA in the last 168 hours. Coagulation Profile: No results for input(s): INR, PROTIME in the last 168 hours. Cardiac Enzymes: No results for input(s): CKTOTAL, CKMB, CKMBINDEX, TROPONINI in the last 168 hours. BNP (last 3 results) Recent Labs    01/26/19 0000  PROBNP 1,162*  HbA1C: Recent Labs    09/07/19 0629  HGBA1C 9.0*   CBG: Recent Labs  Lab 09/07/19 1637 09/07/19 2208 09/08/19 0007 09/08/19 0312 09/08/19 0737  GLUCAP 294* 170* 219* 214* 135*   Lipid Profile: Recent Labs    09/06/19 1500  TRIG 160*   Thyroid Function Tests: No results for input(s): TSH, T4TOTAL, FREET4, T3FREE, THYROIDAB in the last 72 hours. Anemia Panel: Recent Labs    09/07/19 0629 09/08/19 0234  FERRITIN 411* 430*      Radiology Studies: I have reviewed all of the imaging during this hospital visit personally     Scheduled Meds: . albuterol  2 puff Inhalation Q6H  . amiodarone  200 mg Oral Daily  . apixaban  2.5 mg Oral BID  . atorvastatin  20 mg Oral Daily  . citalopram  20 mg Oral Daily  . dexamethasone  6 mg Oral Q24H  . dextromethorphan-guaiFENesin  1 tablet Oral BID  .  finasteride  5 mg Oral Daily  . insulin aspart  0-9 Units Subcutaneous Q4H  . insulin aspart  3 Units Subcutaneous TID WC  . insulin aspart  5 Units Intravenous Once  . insulin detemir  12 Units Subcutaneous BID  . mometasone-formoterol  2 puff Inhalation BID  . multivitamin with minerals  1 tablet Oral Daily  . pantoprazole  40 mg Oral Daily  . sodium chloride flush  3 mL Intravenous Q12H  . tamsulosin  0.4 mg Oral QPC supper  . torsemide  60 mg Oral Daily  . traZODone  50 mg Oral QHS   Continuous Infusions: . remdesivir 100 mg in NS 100 mL Stopped (09/07/19 1214)     LOS: 2 days        Katasha Riga Gerome Apley, MD

## 2019-09-08 NOTE — Progress Notes (Signed)
Attempted to call son Annye Rusk to give an update. No answer.

## 2019-09-08 NOTE — Plan of Care (Signed)

## 2019-09-08 NOTE — Progress Notes (Signed)
Pt admitted to [facility] via transport team.  Pt denied any CP, Pain at this time with only a little SOB with activity.  Pt on 3 LNC at this time, sats 94% or better without exertion.  Pt didn't want nurse to call family to udpate at this time.

## 2019-09-08 NOTE — Progress Notes (Signed)
Attempted to call a [place] for update,no answer

## 2019-09-09 LAB — COMPREHENSIVE METABOLIC PANEL
ALT: 25 U/L (ref 0–44)
AST: 28 U/L (ref 15–41)
Albumin: 2.7 g/dL — ABNORMAL LOW (ref 3.5–5.0)
Alkaline Phosphatase: 68 U/L (ref 38–126)
Anion gap: 12 (ref 5–15)
BUN: 68 mg/dL — ABNORMAL HIGH (ref 8–23)
CO2: 33 mmol/L — ABNORMAL HIGH (ref 22–32)
Calcium: 8.9 mg/dL (ref 8.9–10.3)
Chloride: 93 mmol/L — ABNORMAL LOW (ref 98–111)
Creatinine, Ser: 1.65 mg/dL — ABNORMAL HIGH (ref 0.61–1.24)
GFR calc Af Amer: 43 mL/min — ABNORMAL LOW (ref >60)
GFR calc non Af Amer: 37 mL/min — ABNORMAL LOW (ref >60)
Glucose, Bld: 305 mg/dL — ABNORMAL HIGH (ref 70–99)
Potassium: 3.7 mmol/L (ref 3.5–5.1)
Sodium: 138 mmol/L (ref 135–145)
Total Bilirubin: 0.3 mg/dL (ref 0.3–1.2)
Total Protein: 6.4 g/dL — ABNORMAL LOW (ref 6.5–8.1)

## 2019-09-09 LAB — C-REACTIVE PROTEIN: CRP: 6.6 mg/dL — ABNORMAL HIGH (ref <1.0)

## 2019-09-09 LAB — GLUCOSE, CAPILLARY
Glucose-Capillary: 243 mg/dL — ABNORMAL HIGH (ref 70–99)
Glucose-Capillary: 131 mg/dL — ABNORMAL HIGH (ref 70–99)
Glucose-Capillary: 378 mg/dL — ABNORMAL HIGH (ref 70–99)
Glucose-Capillary: 485 mg/dL — ABNORMAL HIGH (ref 70–99)
Glucose-Capillary: 441 mg/dL — ABNORMAL HIGH (ref 70–99)

## 2019-09-09 LAB — D-DIMER, QUANTITATIVE: D-Dimer, Quant: 0.87 ug/mL-FEU — ABNORMAL HIGH (ref 0.00–0.50)

## 2019-09-09 LAB — FERRITIN: Ferritin: 402 ng/mL — ABNORMAL HIGH (ref 24–336)

## 2019-09-09 MED ORDER — INSULIN ASPART 100 UNIT/ML ~~LOC~~ SOLN
0.0000 [IU] | Freq: Three times a day (TID) | SUBCUTANEOUS | Status: DC
  Administered 2019-09-10: 15 [IU] via SUBCUTANEOUS
  Administered 2019-09-10: 3 [IU] via SUBCUTANEOUS
  Administered 2019-09-11 (×2): 15 [IU] via SUBCUTANEOUS
  Administered 2019-09-11: 5 [IU] via SUBCUTANEOUS
  Administered 2019-09-12: 15 [IU] via SUBCUTANEOUS

## 2019-09-09 MED ORDER — INSULIN DETEMIR 100 UNIT/ML ~~LOC~~ SOLN
24.0000 [IU] | Freq: Two times a day (BID) | SUBCUTANEOUS | Status: DC
  Administered 2019-09-09 – 2019-09-13 (×8): 24 [IU] via SUBCUTANEOUS
  Filled 2019-09-09 (×9): qty 0.24

## 2019-09-09 MED ORDER — INSULIN ASPART 100 UNIT/ML ~~LOC~~ SOLN
15.0000 [IU] | Freq: Once | SUBCUTANEOUS | Status: AC
  Administered 2019-09-09: 15 [IU] via SUBCUTANEOUS

## 2019-09-09 MED ORDER — ENSURE MAX PROTEIN PO LIQD
11.0000 [oz_av] | Freq: Two times a day (BID) | ORAL | Status: DC
  Administered 2019-09-09 – 2019-09-12 (×5): 11 [oz_av] via ORAL
  Filled 2019-09-09 (×7): qty 330

## 2019-09-09 NOTE — Progress Notes (Signed)
This note also relates to the following rows which could not be included: Pulse Rate - Cannot attach notes to unvalidated device data SpO2 - Cannot attach notes to unvalidated device data    09/09/19 2053  Provider Notification  Provider Name/Title Dr Myna Hidalgo  Date Provider Notified 09/09/19  Time Provider Notified 2052  Notification Type Page  Notification Reason Change in status (CBG 441)  Response See new orders  Date of Provider Response 09/09/19

## 2019-09-09 NOTE — Progress Notes (Signed)
PROGRESS NOTE    Charles [name]  [id number] DOB: 03/23/1932 DOA: 09/06/2019 PCP: [name], MD    Brief Narrative:  85 year old male who presented with dyspnea.  He does have significant past medical history for coronary artery disease status post CABG, systolic heart failure, atrial flutter, aortic abdominal aneurysm, severe aortic stenosis status post TAVR, hypertension, dyslipidemia, type 2 diabetes mellitus and chronic hypoxic respiratory failure (2 L/min per nasal cannula) due to COPD. Patient had recent hospitalization 11/06, for decompensated heart failure, discharge he went to a skilled nursing facility where he tested positive for COVID-19. Patient reported progressive dyspnea, his oxygen saturation was 88% on supplemental oxygen, apparently about a week prior to hospitalization he had been on antibiotic for diagnosis of pneumonia. On his initial physical examination his blood pressure 141/69, heart rate 85, temperature 99.3, respiratory rate 30, oxygen saturation 94%, patient had no signs of respiratory distress, heart S1-S2 present, rhythmic, abdomen soft, no lower extremity edema. Sodium 135, potassium 4.1, chloride 95, bicarb 30, glucose 262, BUN 42, creatinine 1.7, white count 11.2, hemoglobin 10.4, hematocrit 32.8, platelets 229.  SARS COVID-19 positive.  His chest x-ray had a right lower lobe atelectasis, left lower lobe interstitial infiltrate.  EKG 91 bpm, rightward axis, prolonged QRS with a sense and V paced, no ST segment changes or T wave abnormalities.  Patient was admitted to the hospital with a working diagnosis of acute hypoxic respiratory failure due to SARS COVID-19 viral pneumonia  Patient has been responding well to medical therapy with remdesivir, and systemic corticosteroids with dexamethasone.  He has been transferred to [facility] on December 4, to continue management of respiratory failure.   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  Acute hypoxic respiratory failure due to SARS COVID-19 viral pneumonia.  RR: 20  Pulse oxymetry: 94%  Fi02: 4 LPM per   COVID-19 Labs  Recent Labs    09/06/19 1500 09/07/19 0629 09/08/19 0234 09/09/19 0246  DDIMER 1.05* 0.91* 0.88* 0.87*  FERRITIN 397* 411* 430* 402*  LDH 285*  --   --   --   CRP 9.4* 11.8* 9.7* 6.6*    Lab Results  Component Value Date   SARSCOV2NAA NEGATIVE 08/19/2019   SARSCOV2NAA NEGATIVE 08/13/2019   SARSCOV2NAA NEGATIVE 08/10/2019   [place] NEGATIVE 06/25/2019     Continue inflammatory markers to trending down.    Tolerating well medical therapy with Remdesivir #4/5 ( AST 28 and ALT 25) On systemic corticosteroids with dexamethasone 6 mg daily, antitussive agents and airway clearing techniques with flutter valve and incentive spirometer.   Pending physical therapy evaluation, patient at home alone.   2. Chronic systolic heart failure. No acute decompensation, will continue to hold on diuretic therapy (torsemide). Blood pressure has been controlled.   3. Paroxysmal atrial flutter, sp pacer. Continue with amiodarone for rate control and apixaban for anticoagulation.   4. Uncontrolled T2DM ( Hgb a1c 9) with steroid induced hyperglycemia. Dyslipidemia. Fasting glucose is 305 this am, capillary 243, 131, 378.  Will further increase basal insulin to  20 units bid, will continue insulin sliding scale for glucose cover and monitoring.   5. CKD stage 3b . Stable renal function with a serum cr at 1,65 with k at 3,7 and  serum bicarbonate at 33. Will continue to hold on torsemide for now.  6. COPD with chronic hypoxic respiratory failure. No acute exacerbation, on bronchodilator therapy, mometasone and formoterol.    DVT prophylaxis: enoxaparin   Code Status:  DNR  Family Communication: I spoke over the phone with the patient's daughter in law about patient's  condition, plan of care and all questions were addressed. Disposition Plan/ discharge barriers:    Body mass index is 28.62 kg/m. Malnutrition Type:      Malnutrition Characteristics:      Nutrition Interventions:     RN Pressure Injury Documentation:     Consultants:     Procedures:     Antimicrobials:       Subjective: Patient is out of bed to the chair, continue to be very weak and deconditioned, no nausea or vomiting, dyspnea is improving, no chest pain.   Objective: Vitals:   09/08/19 1604 09/08/19 2100 09/09/19 0406 09/09/19 0500  BP: (!) 120/55 129/67 123/70   Pulse: 79 73 70   Resp: (!) 24 19 20    Temp: 98 F (36.7 C) 98.7 F (37.1 C) 97.9 F (36.6 C)   TempSrc: Oral Oral Oral   SpO2: 95%     Weight:    98.4 kg  Height:        Intake/Output Summary (Last 24 hours) at 09/09/2019 [id number] Last data filed at 09/09/2019 0500 Gross per 24 hour  Intake -  Output 800 ml  Net -800 ml   Filed Weights   09/06/19 1411 09/08/19 0500 09/09/19 0500  Weight: 101.5 kg 98.6 kg 98.4 kg    Examination:   General: Not in pain or dyspnea, deconditioned  Neurology: Awake and alert, non focal  E ENT: mild pallor, no icterus, oral mucosa moist Cardiovascular: No JVD. S1-S2 present, rhythmic, no gallops, rubs, or murmurs. No lower extremity edema. Pulmonary: positive reath sounds bilaterally. Gastrointestinal. Abdomen with no organomegaly, non tender, no rebound or guarding Skin. No rashes Musculoskeletal: no joint deformities     Data Reviewed: I have personally reviewed following labs and imaging studies   CBC: Recent Labs  Lab 09/06/19 1500 09/07/19 0629 09/08/19 0234  WBC 11.2* 9.8 8.4  NEUTROABS 9.9* 8.6* 6.9  HGB 10.4* 10.0* 9.5*  HCT 32.8* 32.0* 30.7*  MCV 93.7 93.6 93.9  PLT 229 210 [id number]   Basic Metabolic Panel: Recent Labs  Lab 09/06/19 1500 09/07/19 0629 09/08/19 0234 09/09/19 0246  NA 135 139 139 138  K 4.1 4.2 3.8 3.7  CL 95* 97* 94* 93*  CO2 30 32 32 33*  GLUCOSE 262* 161* 207* 305*  BUN 42* 45* 59* 68*  CREATININE 1.77* 1.71* 1.71* 1.65*  CALCIUM 9.2 9.4 8.9 8.9   GFR: Estimated Creatinine Clearance: 40.4 mL/min (A) (by C-G formula based on SCr of 1.65 mg/dL (H)). Liver Function Tests: Recent Labs  Lab 09/06/19 1500 09/07/19 0629 09/08/19 0234 09/09/19 0246  AST 28 23 22 28   ALT 22 19 17 25   ALKPHOS 73 70 64 68  BILITOT 0.6 0.6 0.7 0.3  PROT 6.9 6.6 6.5 6.4*  ALBUMIN 2.9* 2.7* 2.7* 2.7*   No results for input(s): LIPASE, AMYLASE in the last 168 hours. No results for input(s): AMMONIA in the last 168 hours. Coagulation Profile: No results  for input(s): INR, PROTIME in the last 168 hours. Cardiac Enzymes: No results for input(s): CKTOTAL, CKMB, CKMBINDEX, TROPONINI in the last 168 hours. BNP (last 3 results) Recent Labs    01/26/19 0000  PROBNP 1,162*   HbA1C: Recent Labs    09/07/19 0629  HGBA1C 9.0*   CBG: Recent Labs  Lab 09/08/19 1243 09/08/19 1600 09/08/19 2003 09/08/19 2345 09/09/19 0402  GLUCAP 363* 557* 503* 391* 243*   Lipid Profile: Recent Labs    09/06/19 1500  TRIG 160*   Thyroid Function Tests: No results for input(s): TSH, T4TOTAL, FREET4, T3FREE, THYROIDAB in the last 72 hours. Anemia Panel: Recent Labs    09/08/19 0234 09/09/19 0246  FERRITIN 27* 58*      Radiology Studies: I have reviewed all of the imaging during this hospital visit personally     Scheduled Meds: . albuterol  2 puff Inhalation Q6H  . amiodarone  200 mg Oral Daily  . apixaban  2.5 mg Oral BID  . atorvastatin  20 mg Oral  Daily  . chlorpheniramine-HYDROcodone  5 mL Oral Q12H  . citalopram  20 mg Oral Daily  . dexamethasone  6 mg Oral Q24H  . finasteride  5 mg Oral Daily  . insulin aspart  0-9 Units Subcutaneous Q4H  . insulin aspart  3 Units Subcutaneous TID WC  . insulin aspart  5 Units Intravenous Once  . insulin detemir  20 Units Subcutaneous BID  . mometasone-formoterol  2 puff Inhalation BID  . multivitamin with minerals  1 tablet Oral Daily  . pantoprazole  40 mg Oral Daily  . sodium chloride flush  3 mL Intravenous Q12H  . tamsulosin  0.4 mg Oral QPC supper  . torsemide  60 mg Oral Daily  . traZODone  50 mg Oral QHS   Continuous Infusions: . remdesivir 100 mg in NS 100 mL 100 mg (09/08/19 1004)     LOS: 3 days        Arla Boutwell Gerome Apley, MD

## 2019-09-09 NOTE — Progress Notes (Signed)
Initial Nutrition Assessment  INTERVENTION:   -Ensure MAX Protein po BID, each supplement provides 150 kcal and 30 grams of protein  -Pt receiving Hormel Shake daily with Breakfast which provides 520 kcals and 22 g of protein and Magic cup BID with lunch and dinner, each supplement provides 290 kcal and 9 grams of protein, automatically on meal trays to optimize nutritional intake.  NUTRITION DIAGNOSIS:   Increased nutrient needs related to acute illness as evidenced by estimated needs.  GOAL:   Patient will meet greater than or equal to 90% of their needs  MONITOR:   PO intake, Supplement acceptance, Weight trends, I & O's, Labs  REASON FOR ASSESSMENT:   Malnutrition Screening Tool    ASSESSMENT:   85 year old male who presented with dyspnea.  He does have significant past medical history for coronary artery disease status post CABG, systolic heart failure, atrial flutter, aortic abdominal aneurysm, severe aortic stenosis status post TAVR, hypertension, dyslipidemia, type 2 diabetes mellitus and chronic hypoxic respiratory failure (2 L/min per nasal cannula) due to COPD.Patient had recent hospitalization 11/06, for decompensated heart failure, discharge he went to a skilled nursing facility where he tested positive for COVID-19. 12/3: COVID+  **RD working remotely**  Patient currently being treated for active COVID-19 infection. First tested positive around 2 weeks ago.  Will order Ensure Max supplements given elevated CBGs.  Per weight records, pt weighed 225 lbs on 9/28 (4% wt loss x 2 months, insignificant for time frame).  I/Os: -1.7L since admit UOP: 1.4L x 24 hrs  Medications: Multivitamin with minerals daily Labs reviewed:  CBGs: 131-378  NUTRITION - FOCUSED PHYSICAL EXAM:  Working remotely.  Diet Order:   Diet Order            Diet heart healthy/carb modified Room service appropriate? Yes; Fluid consistency: Thin  Diet effective now               EDUCATION NEEDS:   No education needs have been identified at this time  Skin:  Skin Assessment: Reviewed RN Assessment  Last BM:  PTA  Height:   Ht Readings from Last 1 Encounters:  09/06/19 6\' 1"  (1.854 m)    Weight:   Wt Readings from Last 1 Encounters:  09/09/19 98.4 kg    Ideal Body Weight:  83.6 kg  BMI:  Body mass index is 28.62 kg/m.  Estimated Nutritional Needs:   Kcal:  2000-2200  Protein:  90-100g  Fluid:  2L/day  Clayton Bibles, MS, RD, LDN Inpatient Clinical Dietitian Pager: [phone] After Hours Pager: [phone]

## 2019-09-10 LAB — GLUCOSE, CAPILLARY
Glucose-Capillary: 115 mg/dL — ABNORMAL HIGH (ref 70–99)
Glucose-Capillary: 152 mg/dL — ABNORMAL HIGH (ref 70–99)
Glucose-Capillary: 473 mg/dL — ABNORMAL HIGH (ref 70–99)
Glucose-Capillary: 552 mg/dL (ref 70–99)
Glucose-Capillary: 537 mg/dL (ref 70–99)

## 2019-09-10 LAB — COMPREHENSIVE METABOLIC PANEL
ALT: 42 U/L (ref 0–44)
AST: 42 U/L — ABNORMAL HIGH (ref 15–41)
Albumin: 2.6 g/dL — ABNORMAL LOW (ref 3.5–5.0)
Alkaline Phosphatase: 72 U/L (ref 38–126)
Anion gap: 12 (ref 5–15)
BUN: 71 mg/dL — ABNORMAL HIGH (ref 8–23)
CO2: 35 mmol/L — ABNORMAL HIGH (ref 22–32)
Calcium: 9 mg/dL (ref 8.9–10.3)
Chloride: 93 mmol/L — ABNORMAL LOW (ref 98–111)
Creatinine, Ser: 1.7 mg/dL — ABNORMAL HIGH (ref 0.61–1.24)
GFR calc Af Amer: 42 mL/min — ABNORMAL LOW (ref >60)
GFR calc non Af Amer: 36 mL/min — ABNORMAL LOW (ref >60)
Glucose, Bld: 154 mg/dL — ABNORMAL HIGH (ref 70–99)
Potassium: 3.9 mmol/L (ref 3.5–5.1)
Sodium: 140 mmol/L (ref 135–145)
Total Bilirubin: 0.5 mg/dL (ref 0.3–1.2)
Total Protein: 6.3 g/dL — ABNORMAL LOW (ref 6.5–8.1)

## 2019-09-10 LAB — C-REACTIVE PROTEIN: CRP: 4.4 mg/dL — ABNORMAL HIGH (ref <1.0)

## 2019-09-10 LAB — D-DIMER, QUANTITATIVE: D-Dimer, Quant: 1 ug/mL-FEU — ABNORMAL HIGH (ref 0.00–0.50)

## 2019-09-10 LAB — FERRITIN: Ferritin: 373 ng/mL — ABNORMAL HIGH (ref 24–336)

## 2019-09-10 MED ORDER — INSULIN ASPART 100 UNIT/ML ~~LOC~~ SOLN: 0.0000 [IU] | Freq: Every day | SUBCUTANEOUS | Status: DC

## 2019-09-10 MED ORDER — TORSEMIDE 20 MG PO TABS
60.0000 mg | ORAL_TABLET | Freq: Every day | ORAL | Status: DC
  Administered 2019-09-10 – 2019-09-13 (×4): 60 mg via ORAL
  Filled 2019-09-10 (×4): qty 3

## 2019-09-10 MED ORDER — INSULIN ASPART 100 UNIT/ML ~~LOC~~ SOLN
20.0000 [IU] | Freq: Once | SUBCUTANEOUS | Status: AC
  Administered 2019-09-11: 20 [IU] via SUBCUTANEOUS

## 2019-09-10 NOTE — Plan of Care (Signed)
  Problem: Health Behavior/Discharge Planning: Goal: Ability to manage health-related needs will improve Outcome: Progressing   Problem: Activity: Goal: Risk for activity intolerance will decrease Outcome: Progressing   Problem: Coping: Goal: Psychosocial and spiritual needs will be supported Outcome: Progressing

## 2019-09-10 NOTE — Evaluation (Signed)
Physical Therapy Evaluation Patient Details Name: Charles [name] MRN: [id number] DOB: 07/10/1932 Today's Date: 09/10/2019   History of Present Illness  85 year old male who presented with dyspnea. Patient had recent hospitalization 11/06, for decompensated heart failure, discharge he went to a skilled nursing facility where he tested positive for COVID-19. PMH includes coronary artery disease status post CABG, systolic heart failure, atrial flutter, aortic abdominal aneurysm, severe aortic stenosis status post TAVR, hypertension, dyslipidemia, type 2 diabetes mellitus and chronic hypoxic respiratory failure (2 L/min per nasal cannula) due to COPD.  Clinical Impression  The patient presents with  Much weakness, poor ability to stand in [place], listing to the right. Per  Patient, he left rehab and has been home alone with family assisting.  Patient on6 L HFNC with SO2 dropping  Down into 80's, grequent nonproductive coughing.  Pt admitted with above diagnosis. Pt currently with functional limitations due to the deficits listed below (see PT Problem List). Pt will benefit from skilled PT to increase their independence and safety with mobility to allow discharge to the venue listed below.       Follow Up Recommendations SNF    Equipment Recommendations  None recommended by PT    Recommendations for Other Services       Precautions / Restrictions Precautions Precautions: Fall Precaution Comments: leaning to right Restrictions Weight Bearing Restrictions: No  Monitor sats     Mobility  Bed Mobility Overal bed mobility: Needs Assistance Bed Mobility: Sit to Supine     Supine to sit: Min guard;HOB elevated     General bed mobility comments: min guard, heavy use of rail  Transfers Overall transfer level: Needs assistance Equipment used: Rolling walker (2 wheeled) Transfers: Sit to/from Stand Sit to Stand: Mod assist;+2 physical assistance;+2 safety/equipment         General  transfer comment: mod A +2 to rise and steady. Pt with R lateral lean during transfer, needing to sit quickly with placement of chair behind patient shortly after transfer  Ambulation/Gait                Stairs            Wheelchair Mobility    Modified Rankin (Stroke Patients Only)       Balance Overall balance assessment: Needs assistance Sitting-balance support: No upper extremity supported;Feet supported Sitting balance-Leahy Scale: Poor Sitting balance - Comments: ultimately needing posterior support due to low back pain   Standing balance support: Bilateral upper extremity supported;During functional activity Standing balance-Leahy Scale: Poor Standing balance comment: reliant on external support from RW and mod A +2 for steadying                             Pertinent Vitals/Pain Pain Assessment: Faces Faces Pain Scale: Hurts little more Pain Location: low back Pain Descriptors / Indicators: Sore;Discomfort Pain Intervention(s): Monitored during session;Repositioned;Limited activity within patient's tolerance    Home Living Family/patient expects to be discharged to:: Private residence Living Arrangements: Alone Available Help at Discharge: Family;Available PRN/intermittently Type of Home: House Home Access: Level entry     Home Layout: One level Home Equipment: Walker - 4 wheels;Cane - single point;Grab bars - tub/shower;Grab bars - toilet;Tub bench Additional Comments: pt states that his family lives close and checks on him frequently, otherwise lives alone. Unsure of accuracy of this information because pt states he came from home, not from rehab. Chart states pt came from rehab  Prior Function Level of Independence: Needs assistance   Gait / Transfers Assistance Needed: RW usage  ADL's / Homemaking Assistance Needed: reports he has been doing this independently but suspect pt had assistance in rehab  Comments: Conflicting reports of  PLOF from pt and chart review     Hand Dominance   Dominant Hand: Right    Extremity/Trunk Assessment   Upper Extremity Assessment Upper Extremity Assessment: Defer to OT evaluation    Lower Extremity Assessment Lower Extremity Assessment: Generalized weakness    Cervical / Trunk Assessment Cervical / Trunk Assessment: Normal  Communication   Communication: HOH  Cognition   Behavior During Therapy: WFL for tasks assessed/performed Overall Cognitive Status: No family/caregiver present to determine baseline cognitive functioning                                 General Comments: unsure of cognitive baseline, pt with conflicting reports of home set up and situation prior to admission. He does not state that he was in rehab, although per chart review he was found down in bathroom at rehab      General Comments      Exercises     Assessment/Plan    PT Assessment Patient needs continued PT services  PT Problem List Decreased strength;Decreased activity tolerance;Decreased balance;Decreased mobility;Decreased knowledge of use of DME;Cardiopulmonary status limiting activity;Pain       PT Treatment Interventions DME instruction;Gait training;Functional mobility training;Therapeutic activities;Therapeutic exercise;Balance training;Patient/family education    PT Goals (Current goals can be found in the Care Plan section)  Acute Rehab PT Goals Patient Stated Goal: return home alone PT Goal Formulation: With patient Time For Goal Achievement: 09/24/19 Potential to Achieve Goals: Fair    Frequency Min 2X/week   Barriers to discharge Decreased caregiver support      Co-evaluation PT/OT/SLP Co-Evaluation/Treatment: Yes Reason for Co-Treatment: For patient/therapist safety PT goals addressed during session: Mobility/safety with mobility OT goals addressed during session: ADL's and self-care       AM-PAC PT "6 Clicks" Mobility  Outcome Measure Help needed  turning from your back to your side while in a flat bed without using bedrails?: A Little Help needed moving from lying on your back to sitting on the side of a flat bed without using bedrails?: A Little Help needed moving to and from a bed to a chair (including a wheelchair)?: A Lot Help needed standing up from a chair using your arms (e.g., wheelchair or bedside chair)?: A Lot Help needed to walk in hospital room?: Total Help needed climbing 3-5 steps with a railing? : Total 6 Click Score: 12    End of Session Equipment Utilized During Treatment: Gait belt;Oxygen Activity Tolerance: Patient limited by fatigue Patient left: in chair;with chair alarm set Nurse Communication: Mobility status PT Visit Diagnosis: Other abnormalities of gait and mobility (R26.89);Muscle weakness (generalized) (M62.81);History of falling (Z91.81);Difficulty in walking, not elsewhere classified (R26.2)    Time: 1056-1130 PT Time Calculation (min) (ACUTE ONLY): 34 min   Charges:   PT Evaluation $PT Eval Moderate Complexity: [place] Pager [phone] Office [phone]   Claretha Cooper 09/10/2019, 4:26 PM

## 2019-09-10 NOTE — NC FL2 (Signed)
[place] LEVEL OF CARE SCREENING TOOL     IDENTIFICATION  Patient Name: Charles [name] Birthdate: 10-Oct-1931 Sex: male Admission Date (Current Location): 09/06/2019  [facility] and [place] Number:  Engineering geologist and Address:  [facility], [place] [address], [place], [place] [address]      Provider Number: [id number]  Attending Physician Name and Address:  Tawni Millers  Relative Name and Phone Number:       Current Level of Care: Hospital Recommended Level of Care: [place] Prior Approval Number:    Date Approved/Denied:   PASRR Number: [id number]  Discharge Plan: SNF    Current Diagnoses: Patient Active Problem List   Diagnosis Date Noted  . Respiratory failure with hypoxia ([place]) 09/08/2019  . DNR (do not resuscitate) present on admission 09/08/2019  . Pneumonia due to COVID-19 virus 09/07/2019  . COVID-19 09/06/2019  . Acute on chronic heart failure with preserved ejection fraction (HFpEF) ([place]) 08/13/2019  . Chronic respiratory failure ([place]) 07/19/2019  . AKI (acute kidney injury) ([place]) 05/25/2019  . CKD (chronic kidney disease), stage II 12/27/2018  . S/P TAVR (transcatheter aortic valve replacement)   . 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 Paroxysmal atrial flutter ([place])   . Scrotal swelling 02/16/2018  . Coronary artery disease involving native coronary artery of native heart without angina pectoris   . Acute on chronic diastolic CHF (congestive heart failure) ([place]) 09/06/2017  . Urinary urgency 07/01/2017  . Chronic anticoagulation 11/05/2016  . Chronic radicular pain of lower back 02/17/2016  . Aortic stenosis, severe 06/04/2014  . DDD (degenerative disc disease), lumbar 10/02/2013  . Ex-smoker   . Carotid arterial disease ([place]) 08/23/2013  . MDD (major depressive disorder), recurrent episode, moderate ([place]) 09/21/2007  . OSTEOARTHRITIS 09/21/2007  .  COLONIC POLYPS 01/31/2007  . Diabetes mellitus type 2, uncontrolled, with complications ([place]) [id number]  . HYPERCHOLESTEROLEMIA 01/31/2007  . ERECTILE DYSFUNCTION 01/31/2007  . Essential hypertension 01/31/2007  . GERD 01/31/2007  . PSORIASIS 01/31/2007    Orientation RESPIRATION BLADDER Height & Weight     Self, Place  O2(see DC summary) Incontinent Weight: 216 lb 14.9 oz (98.4 kg) Height:  6\' 1"  (185.4 cm)  BEHAVIORAL SYMPTOMS/MOOD NEUROLOGICAL BOWEL NUTRITION STATUS      Continent Diet(heart healthy/carb modified)  AMBULATORY STATUS COMMUNICATION OF NEEDS Skin   Extensive Assist Verbally Skin abrasions, Bruising                       Personal Care Assistance Level of Assistance  Bathing, Feeding, Dressing Bathing Assistance: Limited assistance Feeding assistance: Limited assistance Dressing Assistance: Limited assistance     Functional Limitations Info  Sight, Hearing, Speech Sight Info: Adequate Hearing Info: Adequate Speech Info: Adequate    SPECIAL CARE FACTORS FREQUENCY  PT (By licensed PT), OT (By licensed OT)     PT Frequency: 5x/wk OT Frequency: 5x/wk            Contractures Contractures Info: Not present    Additional Factors Info  Code Status, Allergies, Psychotropic, Isolation Precautions, Insulin Sliding Scale Code Status Info: DNR Allergies Info: Gabapentin, Metoprolol, Spironolactone, Amlodipine, Other, Rosiglitazone Maleate, Tricor Psychotropic Info: Celexa 20mg  daily Insulin Sliding Scale Info: 0-15 units 3x/day with meals; 3 units 3x/day with meals; Levemir 24 units 2x/day Isolation Precautions Info: Airborne/Contact Precautions, COVID+     Current Medications (09/10/2019):  This is the current hospital active medication list Current  Facility-Administered Medications  Medication Dose Route Frequency Provider Last Rate Last Dose  . acetaminophen (TYLENOL) tablet 650 mg  650 mg Oral Q6H PRN Harold Hedge, MD      . albuterol (VENTOLIN  HFA) 108 (90 Base) MCG/ACT inhaler 2 puff  2 puff Inhalation Q6H Harold Hedge, MD   2 puff at 09/10/19 1444  . amiodarone (PACERONE) tablet 200 mg  200 mg Oral Daily Harold Hedge, MD   200 mg at 09/10/19 0805  . apixaban (ELIQUIS) tablet 2.5 mg  2.5 mg Oral BID Harold Hedge, MD   2.5 mg at 09/10/19 [id number]  . atorvastatin (LIPITOR) tablet 20 mg  20 mg Oral Daily Harold Hedge, MD   20 mg at 09/10/19 0805  . chlorpheniramine-HYDROcodone (TUSSIONEX) 10-8 MG/5ML suspension 5 mL  5 mL Oral Q12H Arrien, Jimmy Picket, MD   5 mL at 09/10/19 0806  . citalopram (CELEXA) tablet 20 mg  20 mg Oral Daily Harold Hedge, MD   20 mg at 09/10/19 0805  . dexamethasone (DECADRON) tablet 6 mg  6 mg Oral Q24H Harold Hedge, MD   6 mg at 09/10/19 0806  . finasteride (PROSCAR) tablet 5 mg  5 mg Oral Daily Harold Hedge, MD   5 mg at 09/10/19 0805  . insulin aspart (novoLOG) injection 0-15 Units  0-15 Units Subcutaneous TID WC Arrien, Jimmy Picket, MD   3 Units at 09/10/19 1445  . insulin aspart (novoLOG) injection 3 Units  3 Units Subcutaneous TID WC Regalado, Belkys A, MD   3 Units at 09/10/19 1444  . insulin detemir (LEVEMIR) injection 24 Units  24 Units Subcutaneous BID Opyd, Ilene Qua, MD   24 Units at 09/10/19 1041  . mometasone-formoterol (DULERA) 200-5 MCG/ACT inhaler 2 puff  2 puff Inhalation BID Harold Hedge, MD   2 puff at 09/10/19 [phone]  . multivitamin with minerals tablet 1 tablet  1 tablet Oral Daily Harold Hedge, MD   1 tablet at 09/10/19 0805  . nitroGLYCERIN (NITROSTAT) SL tablet 0.4 mg  0.4 mg Sublingual Q5 min PRN Harold Hedge, MD      . pantoprazole (PROTONIX) EC tablet 40 mg  40 mg Oral Daily Harold Hedge, MD   40 mg at 09/10/19 0805  . polyethylene glycol (MIRALAX / GLYCOLAX) packet 17 g  17 g Oral Daily PRN Harold Hedge, MD   17 g at 09/10/19 0804  . protein supplement (ENSURE MAX) liquid  11 oz Oral BID Arrien, Jimmy Picket, MD   11 oz at 09/10/19 1043  . sodium chloride  flush (NS) 0.9 % injection 3 mL  3 mL Intravenous Q12H Harold Hedge, MD   3 mL at 09/10/19 0750  . tamsulosin (FLOMAX) capsule 0.4 mg  0.4 mg Oral QPC supper Harold Hedge, MD   0.4 mg at 09/09/19 1752  . torsemide (DEMADEX) tablet 60 mg  60 mg Oral Daily Arrien, Jimmy Picket, MD      . traZODone (DESYREL) tablet 50 mg  50 mg Oral QHS Harold Hedge, MD   50 mg at 09/09/19 2123     Discharge Medications: Please see discharge summary for a list of discharge medications.  Relevant Imaging Results:  Relevant Lab Results:   Additional Information SS#: [id number]  Geralynn Ochs, LCSW

## 2019-09-10 NOTE — TOC Initial Note (Signed)
Transition of Care [facility]) - Initial/Assessment Note    Patient Details  Name: Charles [name] MRN: [id number] Date of Birth: 09-21-1932  Transition of Care [facility]) CM/SW Contact:    Geralynn Ochs, LCSW Phone Number: 09/10/2019, 2:45 PM  Clinical Narrative:   CSW spoke with patient's daughter-in-law, [name], to discuss discharge plans and continued recommendation for SNF. CSW confirmed with [name] that patient had been at [name] for rehab, but they were not happy with the care received there. CSW briefly provided update on patient's condition and how he did with OT today, and [name] said it sounds like he has really regressed in his mobility due to COVID illness. [name] agreeable to pursue SNF placement for patient, but would like somewhere other than [place]. CSW provided SNF options that will accept patient now that he's COVID+, and [name] to review the options with the patient's son and let CSW know which facilities they would prefer. [name] asked about [facility] in [place], and CSW confirmed with [facility] that they do not accept COVID+ patients. CSW to fax out referral and follow for choice.        Expected Discharge Plan: Skilled Nursing Facility Barriers to Discharge: Continued Medical Work up, Ship broker   Patient Goals and CMS Choice Patient states their goals for this hospitalization and ongoing recovery are:: return home CMS Medicare.gov Compare Post Acute Care list provided to:: Patient Represenative (must comment) Choice offered to / list presented to : Adult Children  Expected Discharge Plan and Services Expected Discharge Plan: [place] arrangements for the past 2 months: Single Family Home                                      Prior Living Arrangements/Services Living arrangements for the past 2 months: Single Family Home Lives with:: Self Patient language and need for interpreter reviewed:: No Do you feel safe going back  to the place where you live?: Yes      Need for Family Participation in Patient Care: Yes (Comment) Care giver support system in place?: No (comment)   Criminal Activity/Legal Involvement Pertinent to Current Situation/Hospitalization: No - Comment as needed  Activities of Daily Living Home Assistive Devices/Equipment: Walker (specify type), Oxygen, Shower chair without back ADL Screening (condition at time of admission) Patient's cognitive ability adequate to safely complete daily activities?: Yes Is the patient deaf or have difficulty hearing?: Yes Does the patient have difficulty seeing, even when wearing glasses/contacts?: No Does the patient have difficulty concentrating, remembering, or making decisions?: No Patient able to express need for assistance with ADLs?: Yes Does the patient have difficulty dressing or bathing?: Yes Independently performs ADLs?: No Communication: Independent Dressing (OT): Needs assistance Is this a change from baseline?: Pre-admission baseline Grooming: Needs assistance Is this a change from baseline?: Pre-admission baseline Feeding: Independent Bathing: Needs assistance Is this a change from baseline?: Pre-admission baseline Toileting: Needs assistance Is this a change from baseline?: Pre-admission baseline In/Out Bed: Needs assistance Is this a change from baseline?: Pre-admission baseline Walks in Home: Needs assistance Is this a change from baseline?: Pre-admission baseline Does the patient have difficulty walking or climbing stairs?: Yes Weakness of Legs: Both Weakness of Arms/Hands: None  Permission Sought/Granted Permission sought to share information with : Facility Sport and exercise psychologist, Family Supports Permission granted to share information with : Yes, Verbal Permission Granted  Share  Information with NAME: Albertina Senegal  Permission granted to share info w AGENCY: SNF  Permission granted to share info w Relationship: DIL, Son      Emotional Assessment   Attitude/Demeanor/Rapport: Unable to Assess Affect (typically observed): Unable to Assess Orientation: : Oriented to Self, Oriented to Place Alcohol / Substance Use: Not Applicable Psych Involvement: No (comment)  Admission diagnosis:  SOB (shortness of breath) [R06.02] Community acquired pneumonia, unspecified laterality [J18.9] COVID-19 [U07.1] Pneumonia due to COVID-19 virus [U07.1, J12.89] Patient Active Problem List   Diagnosis Date Noted  . Respiratory failure with hypoxia ([place]) 09/08/2019  . DNR (do not resuscitate) present on admission 09/08/2019  . Pneumonia due to COVID-19 virus 09/07/2019  . COVID-19 09/06/2019  . Acute on chronic heart failure with preserved ejection fraction (HFpEF) ([place]) 08/13/2019  . Chronic respiratory failure ([place]) 07/19/2019  . AKI (acute kidney injury) ([place]) 05/25/2019  . CKD (chronic kidney disease), stage II 12/27/2018  . S/P TAVR (transcatheter aortic valve replacement)   . 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 Paroxysmal atrial flutter ([place])   . Scrotal swelling 02/16/2018  . Coronary artery disease involving native coronary artery of native heart without angina pectoris   . Acute on chronic diastolic CHF (congestive heart failure) ([place]) 09/06/2017  . Urinary urgency 07/01/2017  . Chronic anticoagulation 11/05/2016  . Chronic radicular pain of lower back 02/17/2016  . Aortic stenosis, severe 06/04/2014  . DDD (degenerative disc disease), lumbar 10/02/2013  . Ex-smoker   . Carotid arterial disease ([place]) 08/23/2013  . MDD (major depressive disorder), recurrent episode, moderate ([place]) 09/21/2007  . OSTEOARTHRITIS 09/21/2007  . COLONIC POLYPS 01/31/2007  . Diabetes mellitus type 2, uncontrolled, with complications ([place]) [id number]  . HYPERCHOLESTEROLEMIA 01/31/2007  . ERECTILE DYSFUNCTION 01/31/2007  . Essential hypertension 01/31/2007  . GERD 01/31/2007  . PSORIASIS 01/31/2007    PCP:  [name], MD Pharmacy:   [facility] [place] [facility] [address] [place] [address] Phone: [phone] Fax: [phone]     Social Determinants of Health (SDOH) Interventions    Readmission Risk Interventions Readmission Risk Prevention Plan 08/14/2019  Transportation Screening Complete  PCP or Specialist Appt within 3-5 Days Complete  HRI or [place] Complete  Social Work Consult for [place] Planning/Counseling Complete  Palliative Care Screening Complete  Medication Review Press photographer) Complete  Some recent data might be hidden

## 2019-09-10 NOTE — Evaluation (Signed)
Occupational Therapy Evaluation Patient Details Name: Charles [name] MRN: [id number] DOB: [birth year] Today's Date: 09/10/2019    History of Present Illness 85 year old male who presented with dyspnea. Patient had recent hospitalization 11/06, for decompensated heart failure, discharge he went to a skilled nursing facility where he tested positive for COVID-19. PMH includes coronary artery disease status post CABG, systolic heart failure, atrial flutter, aortic abdominal aneurysm, severe aortic stenosis status post TAVR, hypertension, dyslipidemia, type 2 diabetes mellitus and chronic hypoxic respiratory failure (2 L/min per nasal cannula) due to COPD.   Clinical Impression   Pt admitted with above diagnoses, presenting with generalized weakness and decreased activity tolerance with cardiopulmonary compromise. Unsure of exact PLOF due to pt being poor historian. Per chart review, pt is from [facility], found down in bathroom there. He does not state that he was there prior, states he was living at home alone. At time of eval, pt is min guard for bed mobility with use of rails. He requires mod A +2 for sit <> stand and transfer to chair. Once standing, pt wanting to complete functional mobility but needing chair pulled up behind him to sit to maintain safety. Given current status, recommend SNF at d/c for safe progression of BADL prior to living alone. Pt seems he may refuse SNF, if this is the case pt will need 24/7 physical assist. Will continue to follow per POC listed below.    Follow Up Recommendations  SNF;Supervision/Assistance - 24 hour    Equipment Recommendations  None recommended by OT    Recommendations for Other Services       Precautions / Restrictions Precautions Precautions: Fall Restrictions Weight Bearing Restrictions: No      Mobility Bed Mobility Overal bed mobility: Needs Assistance Bed Mobility: Sit to Supine     Supine to sit: Min guard;HOB elevated      General bed mobility comments: min guard, heavy use of rail  Transfers Overall transfer level: Needs assistance Equipment used: Rolling walker (2 wheeled) Transfers: Sit to/from Stand Sit to Stand: Mod assist;+2 physical assistance;+2 safety/equipment         General transfer comment: mod A +2 to rise and steady. Pt with R lateral lean during transfer, needing to sit quickly with placement of chair behind patient shortly after transfer    Balance Overall balance assessment: Needs assistance Sitting-balance support: No upper extremity supported;Feet supported Sitting balance-Leahy Scale: Poor Sitting balance - Comments: ultimately needing posterior support due to low back pain   Standing balance support: Bilateral upper extremity supported;During functional activity Standing balance-Leahy Scale: Poor Standing balance comment: reliant on external support from RW and mod A +2 for steadying                           ADL either performed or assessed with clinical judgement   ADL Overall ADL's : Needs assistance/impaired Eating/Feeding: Set up;Sitting Eating/Feeding Details (indicate cue type and reason): feeding self applesauce on arrival Grooming: Set up;Sitting   Upper Body Bathing: Minimal assistance;Sitting   Lower Body Bathing: Maximal assistance;Sitting/lateral leans;Sit to/from stand   Upper Body Dressing : Minimal assistance;Sitting   Lower Body Dressing: Minimal assistance;Sit to/from stand;Sitting/lateral leans   Toilet Transfer: Moderate assistance;+2 for physical assistance;+2 for safety/equipment;Stand-pivot;RW Toilet Transfer Details (indicate cue type and reason): simulated with recliner, pt having R lateral lean and needed additional support Toileting- Clothing Manipulation and Hygiene: Maximal assistance;Sitting/lateral lean;Sit to/from stand   Tub/ Shower Transfer: Maximal assistance;Stand-pivot;Shower  seat;Rolling walker   Functional mobility  during ADLs: Moderate assistance;+2 for physical assistance;+2 for safety/equipment;Wheelchair(stand pivot only) General ADL Comments: pt presenting with generalized weakness, cardiopulmonary compromise, and decreased activity tolerance for safe BADL     Vision Baseline Vision/History: No visual deficits Patient Visual Report: No change from baseline       Perception     Praxis      Pertinent Vitals/Pain Pain Assessment: Faces Faces Pain Scale: Hurts little more Pain Location: low back Pain Descriptors / Indicators: Sore;Discomfort Pain Intervention(s): Limited activity within patient's tolerance;Monitored during session;Repositioned     Hand Dominance Right   Extremity/Trunk Assessment Upper Extremity Assessment Upper Extremity Assessment: Generalized weakness   Lower Extremity Assessment Lower Extremity Assessment: Defer to PT evaluation       Communication Communication Communication: HOH   Cognition   Behavior During Therapy: [facility] for tasks assessed/performed Overall Cognitive Status: No family/caregiver present to determine baseline cognitive functioning                                 General Comments: unsure of cognitive baseline, pt with conflicting reports of home set up and situation prior to admission. He does not state that he was in rehab, although per chart review he was found down in bathroom at rehab   General Comments       Exercises     Shoulder [place] expects to be discharged to:: Private residence Living Arrangements: Alone Available Help at Discharge: Family;Available PRN/intermittently Type of Home: House Home Access: Level entry     Home Layout: One level     Bathroom Shower/Tub: Teacher, early years/pre: Standard     Home Equipment: Environmental consultant - 4 wheels;Cane - single point;Grab bars - tub/shower;Grab bars - toilet;Tub bench   Additional Comments: pt states that his  family lives close and checks on him frequently, otherwise lives alone. Unsure of accuracy of this information because pt states he came from home, not from rehab. Chart states pt came from rehab      Prior Functioning/Environment Level of Independence: Needs assistance  Gait / Transfers Assistance Needed: RW usage ADL's / Homemaking Assistance Needed: reports he has been doing this independently but suspect pt had assistance in rehab   Comments: Conflicting reports of PLOF from pt and chart review        OT Problem List: Decreased strength;Decreased knowledge of use of DME or AE;Decreased activity tolerance;Cardiopulmonary status limiting activity;Impaired balance (sitting and/or standing);Pain;Decreased cognition      OT Treatment/Interventions: Self-care/ADL training;Therapeutic exercise;Patient/family education;Balance training;Energy conservation;Therapeutic activities;DME and/or AE instruction    OT Goals(Current goals can be found in the care plan section) Acute Rehab OT Goals Patient Stated Goal: return home alone OT Goal Formulation: With patient Time For Goal Achievement: 09/24/19 Potential to Achieve Goals: Good  OT Frequency: Min 2X/week   Barriers to D/C:            Co-evaluation PT/OT/SLP Co-Evaluation/Treatment: Yes Reason for Co-Treatment: For patient/therapist safety;To address functional/ADL transfers PT goals addressed during session: Mobility/safety with mobility;Proper use of DME OT goals addressed during session: ADL's and self-care;Proper use of Adaptive equipment and DME      AM-PAC OT "6 Clicks" Daily Activity     Outcome Measure Help from another person eating meals?: A Little Help from another person taking care of personal grooming?: A Little   Help  from another person bathing (including washing, rinsing, drying)?: A Lot Help from another person to put on and taking off regular upper body clothing?: A Little Help from another person to put on  and taking off regular lower body clothing?: A Lot 6 Click Score: 13   End of Session Equipment Utilized During Treatment: Gait belt;Rolling walker;Oxygen Nurse Communication: Mobility status  Activity Tolerance: Patient tolerated treatment well Patient left: in chair;with call bell/phone within reach;with chair alarm set  OT Visit Diagnosis: Unsteadiness on feet (R26.81);History of falling (Z91.81);Pain;Muscle weakness (generalized) (M62.81);Other abnormalities of gait and mobility (R26.89) Pain - part of body: (back)                Time: 1102-1130 OT Time Calculation (min): 28 min Charges:  OT General Charges $OT Visit: 1 Visit OT Evaluation $OT Eval Moderate Complexity: [employer], MSOT, OTR/L [employer] OT/ Acute Relief OT [facility] Office: [phone]   Zenovia Jarred 09/10/2019, 2:03 PM

## 2019-09-10 NOTE — Progress Notes (Addendum)
PROGRESS NOTE    Charles [name]  [id number] DOB: [birth year] DOA: 09/06/2019 PCP: [name], MD    Brief Narrative:  85 year old male who presented with dyspnea. He does have significant past medical history for coronary artery disease status post CABG, systolic heart failure, atrial flutter, aortic abdominal aneurysm, severe aortic stenosis status post TAVR, hypertension, dyslipidemia, type 2 diabetes mellitus and chronic hypoxic respiratory failure (2 L/min per nasal cannula) due to COPD. Patient had recent hospitalization11/06,for decompensated heart failure, discharge he went to a skilled nursing facility where he tested positive for COVID-19. Patient reported progressive dyspnea, his oxygen saturation was 88% on supplemental oxygen, apparently about a week prior to hospitalization he had been on antibiotic for diagnosis of pneumonia. On his initial physical examination his blood pressure 141/69, heart rate 85, temperature 99.3, respiratory rate 30, oxygen saturation 94%, patient had no signs of respiratory distress, heart S1-S2 present, rhythmic, abdomen soft, no lower extremity edema. Sodium 135, potassium 4.1, chloride 95, bicarb 30, glucose 262, BUN 42, creatinine 1.7, white count 11.2, hemoglobin 10.4, hematocrit 32.8, platelets 229.SARS COVID-19 positive. His chest x-ray had a right lower lobe atelectasis, left lower lobe interstitial infiltrate. EKG 91 bpm, rightward axis, prolonged QRS with a sense and V paced,no ST segment changes or T wave abnormalities.  Patient was admitted to the hospital with a working diagnosis of acute hypoxic respiratory failure due to SARS COVID-19 viral pneumonia  Patient has been responding well to medical therapy with remdesivir, and systemic corticosteroids with dexamethasone.  He has been transferred to [facility] on December 4,to continue management of respiratory failure.   Assessment & Plan:   Principal Problem:    Pneumonia due to COVID-19 virus Active Problems:   Diabetes mellitus type 2, uncontrolled, with complications (HCC)   HYPERCHOLESTEROLEMIA   Paroxysmal atrial flutter (HCC)   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 [employer]   S/P TAVR (transcatheter aortic valve replacement)   CKD (chronic kidney disease), stage II   COVID-19   Respiratory failure with hypoxia ([place])   DNR (do not resuscitate) present on admission    1.Acute hypoxic respiratory failure due to SARS COVID-19 viral pneumonia.  RR: 22  Pulse oxymetry: 90%  Fi02: 6 LPM per HFNC   COVID-19 Labs  Recent Labs    09/08/19 0234 09/09/19 0246 09/10/19 0322  DDIMER 0.88* 0.87* 1.00*  FERRITIN 430* 402* 373*  CRP 9.7* 6.6* 4.4*    Lab Results  Component Value Date   SARSCOV2NAA NEGATIVE 08/19/2019   [place] NEGATIVE 08/13/2019   [place] NEGATIVE 08/10/2019   [place] NEGATIVE 06/25/2019    His inflammatory markers continue to trend down.   Continue medical therapy with Remdesivir #5/5 ( AST 42 and ALT 42) Continue with systemic corticosteroids (dexamethasone 6 mg daily), antitussive agents and airway clearing techniques with flutter valve and incentive spirometer.   Patient continue to be very weak and deconditioned, will need SNF for further recovery.   2. Chronic systolic heart failure. No clinical signs of acute decompensation, resume torsemide for now, home dose.   3. Paroxysmal atrial flutter, sp pacer. Rate control with amiodarone,  apixaban for anticoagulation.   4. Uncontrolled T2DM ( Hgb a1c 9) with steroid induced hyperglycemia. Dyslipidemia. Fasting glucose is 154 this am, continue with basal insulin to 20 units bid, plus insulin sliding scale for glucose cover and monitoring. Continue with statin therapy.   5. CKD stage 3b . Renal function stable with a serum cr at 1,70 with k  at 3,9 and serum bicarbonate at 35. Resume torsemide per home regimen, to prevent volume overload.    6. COPD with chronic hypoxic respiratory failure. On mometasone and formoterol.No signs of acute exacerbation.    DVT prophylaxis:enoxaparin Code Status:DNR Family Communication:no family at the bedside.  Disposition Plan/ discharge barriers:SNF.   Body mass index is 28.62 kg/m. Malnutrition Type:  Nutrition Problem: Increased nutrient needs Etiology: acute illness   Malnutrition Characteristics:  Signs/Symptoms: estimated needs   Nutrition Interventions:  Interventions: Ensure Enlive (each supplement provides 350kcal and 20 grams of protein)  RN Pressure Injury Documentation:     Consultants:     Procedures:     Antimicrobials:       Subjective: Patient continue to be very weak and deconditioned, no nausea or vomiting, no chest pain.   Objective: Vitals:   09/10/19 0300 09/10/19 0400 09/10/19 0530 09/10/19 0738  BP:   (!) 121/56 (!) 104/57  Pulse: 69 69 70   Resp:   16 (!) 22  Temp:   98.3 F (36.8 C) (!) 96.4 F (35.8 C)  TempSrc:   Oral Axillary  SpO2: 96% 96% 97%   Weight:      Height:        Intake/Output Summary (Last 24 hours) at 09/10/2019 [id number] Last data filed at 09/10/2019 0600 Gross per 24 hour  Intake 1477.87 ml  Output 2850 ml  Net -1372.13 ml   Filed Weights   09/06/19 1411 09/08/19 0500 09/09/19 0500  Weight: 101.5 kg 98.6 kg 98.4 kg    Examination:   General: Not in pain or dyspnea, deconditioned  Neurology: Awake and alert, non focal  E ENT: mild pallor, no icterus, oral mucosa moist Cardiovascular: No JVD. S1-S2 present, rhythmic, no gallops, rubs, or murmurs. No lower extremity edema. Pulmonary: positive breath sounds bilaterally. Gastrointestinal. Abdomen with, no organomegaly, non tender, no rebound or guarding Skin. No rashes Musculoskeletal: no joint deformities     Data Reviewed: I have personally reviewed following labs and imaging studies  CBC: Recent Labs  Lab 09/06/19 1500 09/07/19 0629  09/08/19 0234  WBC 11.2* 9.8 8.4  NEUTROABS 9.9* 8.6* 6.9  HGB 10.4* 10.0* 9.5*  HCT 32.8* 32.0* 30.7*  MCV 93.7 93.6 93.9  PLT 229 210 [id number]   Basic Metabolic Panel: Recent Labs  Lab 09/06/19 1500 09/07/19 0629 09/08/19 0234 09/09/19 0246 09/10/19 0322  NA 135 139 139 138 140  K 4.1 4.2 3.8 3.7 3.9  CL 95* 97* 94* 93* 93*  CO2 30 32 32 33* 35*  GLUCOSE 262* 161* 207* 305* 154*  BUN 42* 45* 59* 68* 71*  CREATININE 1.77* 1.71* 1.71* 1.65* 1.70*  CALCIUM 9.2 9.4 8.9 8.9 9.0   GFR: Estimated Creatinine Clearance: 39.2 mL/min (A) (by C-G formula based on SCr of 1.7 mg/dL (H)). Liver Function Tests: Recent Labs  Lab 09/06/19 1500 09/07/19 0629 09/08/19 0234 09/09/19 0246 09/10/19 0322  AST 28 23 22 28  42*  ALT 22 19 17 25  42  ALKPHOS 73 70 64 68 72  BILITOT 0.6 0.6 0.7 0.3 0.5  PROT 6.9 6.6 6.5 6.4* 6.3*  ALBUMIN 2.9* 2.7* 2.7* 2.7* 2.6*   No results for input(s): LIPASE, AMYLASE in the last 168 hours. No results for input(s): AMMONIA in the last 168 hours. Coagulation Profile: No results for input(s): INR, PROTIME in the last 168 hours. Cardiac Enzymes: No results for input(s): CKTOTAL, CKMB, CKMBINDEX, TROPONINI in the last 168 hours. BNP (last 3 results) Recent Labs  01/26/19 0000  PROBNP 1,162*   HbA1C: No results for input(s): HGBA1C in the last 72 hours. CBG: Recent Labs  Lab 09/09/19 0402 09/09/19 0908 09/09/19 1228 09/09/19 1719 09/09/19 1947  GLUCAP 243* 131* 378* 485* 441*   Lipid Profile: No results for input(s): CHOL, HDL, LDLCALC, TRIG, CHOLHDL, LDLDIRECT in the last 72 hours. Thyroid Function Tests: No results for input(s): TSH, T4TOTAL, FREET4, T3FREE, THYROIDAB in the last 72 hours. Anemia Panel: Recent Labs    09/09/19 0246 09/10/19 0322  FERRITIN 402* 373*      Radiology Studies: I have reviewed all of the imaging during this hospital visit personally     Scheduled Meds: . albuterol  2 puff Inhalation Q6H  .  amiodarone  200 mg Oral Daily  . apixaban  2.5 mg Oral BID  . atorvastatin  20 mg Oral Daily  . chlorpheniramine-HYDROcodone  5 mL Oral Q12H  . citalopram  20 mg Oral Daily  . dexamethasone  6 mg Oral Q24H  . finasteride  5 mg Oral Daily  . insulin aspart  0-15 Units Subcutaneous TID WC  . insulin aspart  3 Units Subcutaneous TID WC  . insulin detemir  24 Units Subcutaneous BID  . mometasone-formoterol  2 puff Inhalation BID  . multivitamin with minerals  1 tablet Oral Daily  . pantoprazole  40 mg Oral Daily  . Ensure Max Protein  11 oz Oral BID  . sodium chloride flush  3 mL Intravenous Q12H  . tamsulosin  0.4 mg Oral QPC supper  . traZODone  50 mg Oral QHS   Continuous Infusions: . remdesivir 100 mg in NS 100 mL 100 mg (09/10/19 0816)     LOS: 4 days        Arin Peral Gerome Apley, MD

## 2019-09-11 LAB — CULTURE, BLOOD (ROUTINE X 2)
Culture: NO GROWTH
Culture: NO GROWTH
Special Requests: ADEQUATE

## 2019-09-11 LAB — GLUCOSE, CAPILLARY
Glucose-Capillary: 409 mg/dL — ABNORMAL HIGH (ref 70–99)
Glucose-Capillary: 269 mg/dL — ABNORMAL HIGH (ref 70–99)
Glucose-Capillary: 219 mg/dL — ABNORMAL HIGH (ref 70–99)
Glucose-Capillary: 353 mg/dL — ABNORMAL HIGH (ref 70–99)
Glucose-Capillary: 401 mg/dL — ABNORMAL HIGH (ref 70–99)
Glucose-Capillary: 383 mg/dL — ABNORMAL HIGH (ref 70–99)

## 2019-09-11 LAB — COMPREHENSIVE METABOLIC PANEL
ALT: 120 U/L — ABNORMAL HIGH (ref 0–44)
AST: 127 U/L — ABNORMAL HIGH (ref 15–41)
Albumin: 2.8 g/dL — ABNORMAL LOW (ref 3.5–5.0)
Alkaline Phosphatase: 77 U/L (ref 38–126)
Anion gap: 13 (ref 5–15)
BUN: 74 mg/dL — ABNORMAL HIGH (ref 8–23)
CO2: 35 mmol/L — ABNORMAL HIGH (ref 22–32)
Calcium: 8.5 mg/dL — ABNORMAL LOW (ref 8.9–10.3)
Chloride: 86 mmol/L — ABNORMAL LOW (ref 98–111)
Creatinine, Ser: 1.52 mg/dL — ABNORMAL HIGH (ref 0.61–1.24)
GFR calc Af Amer: 48 mL/min — ABNORMAL LOW (ref >60)
GFR calc non Af Amer: 41 mL/min — ABNORMAL LOW (ref >60)
Glucose, Bld: 287 mg/dL — ABNORMAL HIGH (ref 70–99)
Potassium: 3.9 mmol/L (ref 3.5–5.1)
Sodium: 134 mmol/L — ABNORMAL LOW (ref 135–145)
Total Bilirubin: 0.9 mg/dL (ref 0.3–1.2)
Total Protein: 6.3 g/dL — ABNORMAL LOW (ref 6.5–8.1)

## 2019-09-11 LAB — D-DIMER, QUANTITATIVE: D-Dimer, Quant: 1.2 ug/mL-FEU — ABNORMAL HIGH (ref 0.00–0.50)

## 2019-09-11 LAB — C-REACTIVE PROTEIN: CRP: 2.9 mg/dL — ABNORMAL HIGH (ref <1.0)

## 2019-09-11 LAB — FERRITIN: Ferritin: 360 ng/mL — ABNORMAL HIGH (ref 24–336)

## 2019-09-11 MED ORDER — DEXAMETHASONE 6 MG PO TABS
6.0000 mg | ORAL_TABLET | ORAL | Status: DC
  Administered 2019-09-12: 6 mg via ORAL
  Filled 2019-09-11: qty 1

## 2019-09-11 MED ORDER — ENSURE MAX PROTEIN PO LIQD: 11.0000 [oz_av] | Freq: Two times a day (BID) | ORAL | 0 refills | Status: DC

## 2019-09-11 MED ORDER — DEXAMETHASONE 6 MG PO TABS: 6.0000 mg | ORAL_TABLET | ORAL | Status: DC

## 2019-09-11 NOTE — TOC Progression Note (Signed)
Transition of Care [facility]) - Progression Note    Patient Details  Name: Charles [name] MRN: [id number] Date of Birth: 23-Jul-1932  Transition of [facility]) CM/SW Contact  Loletha Grayer Beverely Pace, RN Phone Number: [phone] (working remotely) 09/11/2019, 1:28 PM  Clinical Narrative:   Case manager received called from [facility] with [facility], they can offer patient a bed. CM contacted patient's daughter-in-law Charles [name] with offer and she accepted bed. Case manager called [facility] and submitted [place]. Ref#is [id number]. Will wait for approval..     Expected Discharge Plan: [place] Barriers to Discharge: Insurance Authorization  Expected Discharge Plan and Services Expected Discharge Plan: [place] arrangements for the past 2 months: Single Family Home Expected Discharge Date: 09/11/19                                     Social Determinants of Health (SDOH) Interventions    Readmission Risk Interventions Readmission Risk Prevention Plan 08/14/2019  Transportation Screening Complete  PCP or Specialist Appt within 3-5 Days Complete  HRI or [place] Complete  Social Work Consult for [place] Planning/Counseling Complete  Palliative Care Screening Complete  Medication Review Press photographer) Complete  Some recent data might be hidden

## 2019-09-11 NOTE — Care Management Important Message (Signed)
Important Message  Patient Details  Name: Charles [name] MRN: [id number] Date of Birth: Nov 28, 1931   Medicare Important Message Given:  Yes - Important Message mailed due to current National Emergency  Verbal consent obtained due to current National Emergency  Relationship to patient: Other (must comment)(daughter in law) Contact Name: Aerik Bandura Call Date: 09/11/19  Time: 1521 Phone: [phone] Outcome: Spoke with contact Important Message mailed to: Patient address on file    Delorse Lek 09/11/2019, 3:21 PM

## 2019-09-11 NOTE — TOC Progression Note (Signed)
Transition of Care [facility]) - Progression Note    Patient Details  Name: HENDRIX RUDENKO MRN: [id number] Date of Birth: August 24, 1932  Transition of Care [facility]) CM/SW Contact  Ninfa Meeker, RN Phone Number: [phone] (working remotely) 09/11/2019, 12:50 PM  Clinical Narrative:   Case manager spoke with patient's daughter-in-law [name] to discuss discharge. AS of today we have no bed offers. The facility she was interested in , [facility] in [place], is not accepting COVID patients, [facility] has no available bed, [facility] has no available bed, and [facility] in [place] is on "[employer] per [place]. Will continue to work on placement.    Expected Discharge Plan: [place] Barriers to Discharge: No SNF bed, SNF Pending bed offer  Expected Discharge Plan and Services Expected Discharge Plan: [place] arrangements for the past 2 months: Single Family Home Expected Discharge Date: 09/11/19                                     Social Determinants of Health (SDOH) Interventions    Readmission Risk Interventions Readmission Risk Prevention Plan 08/14/2019  Transportation Screening Complete  PCP or Specialist Appt within 3-5 Days Complete  HRI or [place] Complete  Social Work Consult for [place] Planning/Counseling Complete  Palliative Care Screening Complete  Medication Review Press photographer) Complete  Some recent data might be hidden

## 2019-09-11 NOTE — Discharge Summary (Signed)
Physician Discharge Summary  Charles [name] [id number] DOB: [birth year] DOA: 09/06/2019  PCP: [name], MD  Admit date: 09/06/2019 Discharge date: 09/11/2019  Admitted From: SNF  Disposition:  SNF   Recommendations for Outpatient Follow-up and new medication changes:  1. Follow up with Dr. [name] in 2 weeks.  2. Continue quarantine for 2 weeks, use a mask in public and maintain physical distancing.   Home Health: NA   Equipment/Devices: Supplemental 02  4LPM per [place]  Discharge Condition: stable  CODE STATUS: DNR   Diet recommendation:  Heart healthy and diabetic prudent.   Brief/Interim Summary: 85 year old male who presented with dyspnea. He does have significant past medical history for coronary artery disease status post CABG, systolic heart failure, atrial flutter, aortic abdominal aneurysm, severe aortic stenosis status post TAVR, hypertension, dyslipidemia, type 2 diabetes mellitus and chronic hypoxic respiratory failure (2 L/min per nasal cannula) due to COPD. Patient had recent hospitalization11/06,for decompensated heart failure, at discharge he went to a skilled nursing facility where he tested positive for COVID-19. Patient reported progressive dyspnea, his oxygen saturation was 88% on supplemental oxygen, apparently about a week prior to hospitalization he had been on antibiotic therapy for a diagnosis of pneumonia. On his initial physical examination his blood pressure 141/69, heart rate 85, temperature 99.3, respiratory rate 30, oxygen saturation 94%, patient had no signs of respiratory distress, heart S1-S2 present, rhythmic, abdomen soft, no lower extremity edema. Sodium 135, potassium 4.1, chloride 95, bicarb 30, glucose 262, BUN 42, creatinine 1.7, white count 11.2, hemoglobin 10.4, hematocrit 32.8, platelets 229.SARS COVID-19 positive. His chest x-ray had a right lower lobe atelectasis, left lower lobe interstitial infiltrate. EKG 91 bpm, rightward  axis, prolonged QRS with atrial sense and V paced,no ST segment changes or T wave abnormalities.  Patient was admitted to the hospital with a working diagnosis of acute on chronic hypoxic respiratory failure due to SARS COVID-19 viral pneumonia  Patient has been responding well to medical therapy with remdesivir, and systemic corticosteroids with dexamethasone.  He has been transferred to [facility] on December 4,to continue management of respiratory failure.  1.  Acute hypoxic respiratory failure due to SARS COVID-19 viral pneumonia.  Patient was admitted to the medical ward, he received supplemental oxygen per high flow nasal cannula, intravenous corticosteroids, and remdesivir.  He was treated with antitussive agents, bronchodilators and airway clearance techniques with incentive spirometer and flutter valve.  His inflammatory markers and symptoms improved.  Physical therapy recommended continue rehab at a skilled nursing facility.  2.  Chronic systolic heart failure.  No signs of acute decompensation, patient will continue torsemide for diuresis.  Continue blood pressure control.  3.  Paroxysmal atrial fibrillation status post pacemaker.  Patient remained rate controlled, he was continued on amiodarone, and anticoagulation with apixaban.  4.  Uncontrolled type 2 diabetes mellitus (hemoglobin A1c 9) with steroid-induced hyperglycemia/dyslipidemia.  Patient required high doses of insulin for steroid-induced hyperglycemia.  At discharge dexamethasone will be discontinued, patient will resume his regular insulin regimen.  Continue statin therapy.  5.  Chronic kidney disease stage IIIb.  Patient's kidney function remained stable, serum creatinine discharge 1.52, potassium 3.9 and serum bicarb 35.  Patient will continue torsemide therapy along with potassium supplements.  Close follow-up of kidney function as an outpatient  6.  COPD with chronic hypoxic respiratory failure (2 L per  nasal cannula).  Patient will continue mometasone and formoterol, no clinical signs of acute exacerbation during this hospitalization.   Discharge Diagnoses:  Principal Problem:   Pneumonia due to COVID-19 virus Active Problems:   Diabetes mellitus type 2, uncontrolled, with complications (HCC)   HYPERCHOLESTEROLEMIA   Paroxysmal atrial flutter (HCC)   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 [employer]   S/P TAVR (transcatheter aortic valve replacement)   CKD (chronic kidney disease), stage II   COVID-19   Respiratory failure with hypoxia ([place])   DNR (do not resuscitate) present on admission    Discharge Instructions   Allergies as of 09/11/2019      Reactions   Gabapentin Other (See Comments)   Leg pain, weakness   Metoprolol Swelling   Spironolactone Other (See Comments)   Painful gynecomastia   Amlodipine Other (See Comments)   Edema   Other Other (See Comments)   Horse serum - myalgias   Rosiglitazone Maleate Other (See Comments)    Did not help   Tricor [fenofibrate] Other (See Comments)   myalgias      Medication List    TAKE these medications   Accu-Chek Aviva Plus test strip Generic drug: glucose blood Use as instructed to check blood sugar three times a day. Dx code E11.8 & E11.65   acetaminophen 500 MG tablet Commonly known as: TYLENOL Take 1 tablet (500 mg total) by mouth 3 (three) times daily. What changed:   how much to take  when to take this  reasons to take this   amiodarone 200 MG tablet Commonly known as: PACERONE Take 1 tablet (200 mg total) by mouth daily.   apixaban 2.5 MG Tabs tablet Commonly known as: ELIQUIS Take 1 tablet (2.5 mg total) by mouth 2 (two) times daily.   atorvastatin 20 MG tablet Commonly known as: LIPITOR Take 1 tablet (20 mg total) by mouth daily.   BD Insulin Syringe U/F 30G X 1/2" 0.5 ML Misc Generic drug: Insulin Syringe-Needle U-100 AS DIRECTED.   budesonide-formoterol 160-4.5 MCG/ACT inhaler Commonly  known as: Symbicort Inhale 2 puffs into the lungs 2 (two) times daily.   citalopram 20 MG tablet Commonly known as: CELEXA Take 1 tablet (20 mg total) by mouth daily.   Ensure Max Protein Liqd Take 330 mLs (11 oz total) by mouth 2 (two) times daily.   finasteride 5 MG tablet Commonly known as: PROSCAR Take 1 tablet (5 mg total) by mouth daily.   guaiFENesin 600 MG 12 hr tablet Commonly known as: MUCINEX Take 600 mg by mouth 2 (two) times daily as needed for cough or to loosen phlegm.   insulin glargine 100 UNIT/ML injection Commonly known as: Lantus Lantus 22 units in the morning and 20 units at bedtime.   ipratropium-albuterol 0.5-2.5 (3) MG/3ML Soln Commonly known as: DUONEB Take 3 mLs by nebulization 2 (two) times daily.   lisinopril 10 MG tablet Commonly known as: ZESTRIL Take 1 tablet (10 mg total) by mouth daily.   nitroGLYCERIN 0.4 MG SL tablet Commonly known as: NITROSTAT Place 1 tablet (0.4 mg total) under the tongue every 5 (five) minutes as needed for chest pain.   pantoprazole 40 MG tablet Commonly known as: PROTONIX Take 1 tablet (40 mg total) by mouth daily. Take 1 tablet (20 mg total) by mouth daily.   potassium chloride SA 20 MEQ tablet Commonly known as: KLOR-CON Take 1 tablet (20 mEq total) every Monday, Wednesday, Friday, Saturday, and Sunday. What changed:   how much to take  how to take this  when to take this   tamsulosin 0.4 MG Caps capsule Commonly known as: FLOMAX Take  1 capsule (0.4 mg total) by mouth daily after supper.   torsemide 20 MG tablet Commonly known as: DEMADEX Take 3 tablets (60 mg total) by mouth daily.   traZODone 50 MG tablet Commonly known as: DESYREL Take 1 tablet (50 mg total) by mouth at bedtime.   VITAMIN C PO Take 1 tablet by mouth daily.      Follow-up Information    [name], MD Follow up in 2 week(s).   Specialty: Family Medicine Contact information: [place]  [phone]          Allergies  Allergen Reactions  . Gabapentin Other (See Comments)    Leg pain, weakness   . Metoprolol Swelling  . Spironolactone Other (See Comments)    Painful gynecomastia  . Amlodipine Other (See Comments)    Edema  . Other Other (See Comments)    Horse serum - myalgias  . Rosiglitazone Maleate Other (See Comments)     Did not help  . Tricor [Fenofibrate] Other (See Comments)    myalgias    Consultations:     Procedures/Studies: Dg Chest 2 View  Result Date: 08/13/2019 CLINICAL DATA:  Shortness of breath. EXAM: CHEST - 2 VIEW COMPARISON:  August 10, 2019. FINDINGS: Stable cardiomediastinal silhouette. Atherosclerosis of thoracic aorta is noted. Left-sided pacemaker is unchanged in position. Status post coronary bypass graft and transcatheter aortic valve repair. No pneumothorax or pleural effusion is noted. Right lung is clear. Stable elevated left hemidiaphragm is noted with mild left basilar atelectasis. IMPRESSION: Stable elevated left hemidiaphragm with mild left basilar subsegmental atelectasis. Aortic Atherosclerosis (ICD10-I70.0). Electronically Signed   By: Marijo Conception M.D.   On: 08/13/2019 13:30   Dg Chest Port 1 View  Result Date: 09/06/2019 CLINICAL DATA:  Shortness of breath, chest pain, positive COVID EXAM: PORTABLE CHEST 1 VIEW COMPARISON:  08/13/2019 FINDINGS: Patchy density primarily at the left greater than right lung bases. Small left pleural effusion. No pneumothorax. Stable cardiomediastinal contours. IMPRESSION: Left greater than right basilar pneumonia and atelectasis. Small left pleural effusion. Electronically Signed   By: Macy Mis M.D.   On: 09/06/2019 15:00      Procedures:   Subjective: Patient is feeling better, dyspnea continue to improve, no nausea or vomiting, continue to be weak and deconditioned.   Discharge Exam: Vitals:   09/11/19 0500 09/11/19 0806  BP:  129/68  Pulse:  74  Resp:  18   Temp: 98.3 F (36.8 C) (!) 97.5 F (36.4 C)  SpO2:  94%   Vitals:   09/10/19 1700 09/10/19 1900 09/11/19 0500 09/11/19 0806  BP: 134/64 137/62  129/68  Pulse:    74  Resp:    18  Temp: (!) 96.7 F (35.9 C) 98.6 F (37 C) 98.3 F (36.8 C) (!) 97.5 F (36.4 C)  TempSrc: Axillary Oral Oral Oral  SpO2:    94%  Weight:   97 kg   Height:        General: Not in pain or dyspnea.  Neurology: Awake and alert, non focal  E ENT: mild pallor, no icterus, oral mucosa moist Cardiovascular: No JVD. S1-S2 present, rhythmic. No lower extremity edema. Pulmonary: positive breath sounds bilaterally. Gastrointestinal. Abdomen with, no organomegaly, non tender, no rebound or guarding Skin. No rashes Musculoskeletal: no joint deformities   The results of significant diagnostics from this hospitalization (including imaging, microbiology, ancillary and laboratory) are listed below for reference.     Microbiology: Recent Results (  from the past 240 hour(s))  Blood Culture (routine x 2)     Status: None (Preliminary result)   Collection Time: 09/06/19  2:34 PM   Specimen: BLOOD RIGHT FOREARM  Result Value Ref Range Status   Specimen Description BLOOD RIGHT FOREARM  Final   Special Requests   Final    BOTTLES DRAWN AEROBIC AND ANAEROBIC Blood Culture adequate volume   Culture   Final    NO GROWTH 4 DAYS Performed at [facility], [address], [place], [place] [address]    Report Status PENDING  Incomplete  Blood Culture (routine x 2)     Status: None (Preliminary result)   Collection Time: 09/06/19  2:39 PM   Specimen: BLOOD  Result Value Ref Range Status   Specimen Description BLOOD LEFT ANTECUBITAL  Final   Special Requests   Final    BOTTLES DRAWN AEROBIC AND ANAEROBIC Blood Culture results may not be optimal due to an inadequate volume of blood received in culture bottles   Culture   Final    NO GROWTH 4 DAYS Performed at [facility], [place] [address], [place],  [place] [address]    Report Status PENDING  Incomplete  Culture, blood (single) w Reflex to ID Panel     Status: None (Preliminary result)   Collection Time: 09/07/19  4:55 AM   Specimen: BLOOD  Result Value Ref Range Status   Specimen Description BLOOD RIGHT ARM  Final   Special Requests   Final    BOTTLES DRAWN AEROBIC AND ANAEROBIC Blood Culture adequate volume   Culture   Final    NO GROWTH 3 DAYS Performed at [facility], [address], [place],  [address]    Report Status PENDING  Incomplete     Labs: BNP (last 3 results) Recent Labs    05/24/19 2358 08/13/19 1235 09/06/19 1513  BNP 178.5* 244.0* [id number]*   Basic Metabolic Panel: Recent Labs  Lab 09/07/19 0629 09/08/19 0234 09/09/19 0246 09/10/19 0322 09/11/19 0410  NA 139 139 138 140 134*  K 4.2 3.8 3.7 3.9 3.9  CL 97* 94* 93* 93* 86*  CO2 32 32 33* 35* 35*  GLUCOSE 161* 207* 305* 154* 287*  BUN 45* 59* 68* 71* 74*  CREATININE 1.71* 1.71* 1.65* 1.70* 1.52*  CALCIUM 9.4 8.9 8.9 9.0 8.5*   Liver Function Tests: Recent Labs  Lab 09/07/19 0629 09/08/19 0234 09/09/19 0246 09/10/19 0322 09/11/19 0410  AST 23 22 28  42* 127*  ALT 19 17 25  42 120*  ALKPHOS 70 64 68 72 77  BILITOT 0.6 0.7 0.3 0.5 0.9  PROT 6.6 6.5 6.4* 6.3* 6.3*  ALBUMIN 2.7* 2.7* 2.7* 2.6* 2.8*   No results for input(s): LIPASE, AMYLASE in the last 168 hours. No results for input(s): AMMONIA in the last 168 hours. CBC: Recent Labs  Lab 09/06/19 1500 09/07/19 0629 09/08/19 0234  WBC 11.2* 9.8 8.4  NEUTROABS 9.9* 8.6* 6.9  HGB 10.4* 10.0* 9.5*  HCT 32.8* 32.0* 30.7*  MCV 93.7 93.6 93.9  PLT 229 210 222   Cardiac Enzymes: No results for input(s): CKTOTAL, CKMB, CKMBINDEX, TROPONINI in the last 168 hours. BNP: Invalid input(s): POCBNP CBG: Recent Labs  Lab 09/10/19 2125 09/10/19 2326 09/11/19 0124 09/11/19 0505 09/11/19 0723  GLUCAP 552* 537* 409* 269* 219*   D-Dimer Recent Labs    09/10/19 0322 09/11/19 0410   DDIMER 1.00* 1.20*   Hgb A1c No results for input(s):  HGBA1C in the last 72 hours. Lipid Profile No results for input(s): CHOL, HDL, LDLCALC, TRIG, CHOLHDL, LDLDIRECT in the last 72 hours. Thyroid function studies No results for input(s): TSH, T4TOTAL, T3FREE, THYROIDAB in the last 72 hours.  Invalid input(s): FREET3 Anemia work up Recent Labs    09/09/19 0246 09/10/19 0322  FERRITIN 402* 373*   Urinalysis    Component Value Date/Time   COLORURINE STRAW (A) 08/10/2019 1025   APPEARANCEUR CLEAR (A) 08/10/2019 1025   LABSPEC 1.008 08/10/2019 1025   PHURINE 6.0 08/10/2019 1025   GLUCOSEU 150 (A) 08/10/2019 1025   HGBUR NEGATIVE 08/10/2019 1025   [place] 08/10/2019 1025   BILIRUBINUR negative 02/15/2018 1601   KETONESUR NEGATIVE 08/10/2019 1025   PROTEINUR NEGATIVE 08/10/2019 1025   UROBILINOGEN 0.2 02/15/2018 1601   NITRITE NEGATIVE 08/10/2019 1025   LEUKOCYTESUR SMALL (A) 08/10/2019 1025   Sepsis Labs Invalid input(s): PROCALCITONIN,  WBC,  LACTICIDVEN Microbiology Recent Results (from the past 240 hour(s))  Blood Culture (routine x 2)     Status: None (Preliminary result)   Collection Time: 09/06/19  2:34 PM   Specimen: BLOOD RIGHT FOREARM  Result Value Ref Range Status   Specimen Description BLOOD RIGHT FOREARM  Final   Special Requests   Final    BOTTLES DRAWN AEROBIC AND ANAEROBIC Blood Culture adequate volume   Culture   Final    NO GROWTH 4 DAYS Performed at [facility], [place] [address], [place], [place] [address]    Report Status PENDING  Incomplete  Blood Culture (routine x 2)     Status: None (Preliminary result)   Collection Time: 09/06/19  2:39 PM   Specimen: BLOOD  Result Value Ref Range Status   Specimen Description BLOOD LEFT ANTECUBITAL  Final   Special Requests   Final    BOTTLES DRAWN AEROBIC AND ANAEROBIC Blood Culture results may not be optimal due to an inadequate volume of blood received in culture bottles   Culture    Final    NO GROWTH 4 DAYS Performed at [facility], [place] [address], [place], [place] [address]    Report Status PENDING  Incomplete  Culture, blood (single) w Reflex to ID Panel     Status: None (Preliminary result)   Collection Time: 09/07/19  4:55 AM   Specimen: BLOOD  Result Value Ref Range Status   Specimen Description BLOOD RIGHT ARM  Final   Special Requests   Final    BOTTLES DRAWN AEROBIC AND ANAEROBIC Blood Culture adequate volume   Culture   Final    NO GROWTH 3 DAYS Performed at [facility], [address] [place],  [address]    Report Status PENDING  Incomplete     Time coordinating discharge: 45 minutes  SIGNED:   Tawni Millers, MD  [facility] 09/11/2019, 8:20 AM

## 2019-09-11 NOTE — Consult Note (Signed)
   [facility] CM Inpatient Consult   09/11/2019  LEEANDRE GANGE [birth year] [id number]    Patient screened for 30 day readmission with 4 hospitalizations and 1 ED visit in the past 6 months and has 27% high risk score for unplanned readmission;  and to check for potential needs of [facility] care management services under his [employer] benefit.  Chart review reveals that patient is from [facility] skilled nursing facility for rehab, prior to admission.   Patient was admitted to the hospital with a working diagnosis of acute on chronic hypoxic respiratory failure due to SARS COVID-19 viral pneumonia and has been responding well to medical therapy with remdesivir, and systemic corticosteroids with dexamethasone.  Primary Care Provider is [name], MD This office is listed to provide the transition of care follow up.  Transition of care SW note shows that patient has continued recommendations for SNF (per therapy), and daughter in law was agreeable to pursue SNF placement for patient, but would like somewhere other than [facility].  Will continue to monitor progress and disposition. If there are any changes in disposition or needs, please refer to [facility] care management for appropriate follow-up.   For questions and referral, please call:  Edwena Felty A. Carrye Goller, BSN, RN-BC [facility] Liaison Cell: [phone]

## 2019-09-12 ENCOUNTER — Non-Acute Institutional Stay: Payer: Self-pay | Admitting: Adult Health Nurse Practitioner

## 2019-09-12 LAB — GLUCOSE, CAPILLARY
Glucose-Capillary: 357 mg/dL — ABNORMAL HIGH (ref 70–99)
Glucose-Capillary: 568 mg/dL (ref 70–99)
Glucose-Capillary: 538 mg/dL (ref 70–99)
Glucose-Capillary: 507 mg/dL (ref 70–99)
Glucose-Capillary: 323 mg/dL — ABNORMAL HIGH (ref 70–99)
Glucose-Capillary: 118 mg/dL — ABNORMAL HIGH (ref 70–99)

## 2019-09-12 LAB — CULTURE, BLOOD (SINGLE)
Culture: NO GROWTH
Special Requests: ADEQUATE

## 2019-09-12 MED ORDER — INSULIN ASPART 100 UNIT/ML ~~LOC~~ SOLN
25.0000 [IU] | Freq: Once | SUBCUTANEOUS | Status: AC
  Administered 2019-09-12: 25 [IU] via SUBCUTANEOUS

## 2019-09-12 MED ORDER — INSULIN ASPART 100 UNIT/ML ~~LOC~~ SOLN
8.0000 [IU] | Freq: Three times a day (TID) | SUBCUTANEOUS | Status: DC
  Administered 2019-09-12 – 2019-09-13 (×3): 8 [IU] via SUBCUTANEOUS

## 2019-09-12 MED ORDER — INSULIN ASPART 100 UNIT/ML ~~LOC~~ SOLN: 0.0000 [IU] | Freq: Every day | SUBCUTANEOUS | Status: DC

## 2019-09-12 MED ORDER — INSULIN ASPART 100 UNIT/ML ~~LOC~~ SOLN
0.0000 [IU] | Freq: Three times a day (TID) | SUBCUTANEOUS | Status: DC
  Administered 2019-09-12: 15 [IU] via SUBCUTANEOUS
  Administered 2019-09-13: 7 [IU] via SUBCUTANEOUS

## 2019-09-12 MED ORDER — DEXAMETHASONE 4 MG PO TABS
4.0000 mg | ORAL_TABLET | ORAL | Status: DC
  Administered 2019-09-12: 4 mg via ORAL
  Filled 2019-09-12 (×2): qty 1

## 2019-09-12 MED ORDER — GLUCERNA SHAKE PO LIQD
237.0000 mL | Freq: Three times a day (TID) | ORAL | Status: DC
  Administered 2019-09-12 – 2019-09-13 (×3): 237 mL via ORAL
  Filled 2019-09-12 (×5): qty 237

## 2019-09-12 NOTE — Progress Notes (Addendum)
PROGRESS NOTE    PLEASANT DOBIN  [id number] DOB: 1931/12/20 DOA: 09/06/2019 PCP: [name], MD    Brief Narrative:  85 year old male with significant past medical history for coronary artery disease status post CABG, systolic heart failure, atrial flutter, aortic abdominal aneurysm, severe aortic stenosis status post TAVR, hypertension, dyslipidemia, type 2 diabetes mellitus and chronic hypoxic respiratory failure (2 L/min per nasal cannula) due to COPD.  Recently hospitalized for decompensated CHF.  Went to a skilled nursing facility.  Over there he tested positive for COVID-19.  Over the last few days prior to this admission he has been experiencing worsening shortness of breath.  Oxygen requirements increased.  He was hospitalized for further management.   Assessment & Plan:   Acute hypoxic respiratory failure/pneumonia due to COVID-19  COVID-19 Labs  Recent Labs    09/10/19 0322 09/11/19 0410  DDIMER 1.00* 1.20*  FERRITIN 373* 360*  CRP 4.4* 2.9*   Patient was treated with remdesivir and steroids.  He has completed course of remdesivir.  His inflammatory markers have improved.  CRP was down to 2.9.  D-dimer 1.20.  Experiencing significant hyperglycemia due to steroids.  Steroid dose being tapered down.  Continue with incentive spirometry mobilization.  Patient noted to be very deconditioned.  He will need to go to skilled nursing facility for rehab.  Currently he is on 3 L of oxygen by nasal cannula.  Chronic systolic heart failure Stable from a CHF standpoint.  Continue with torsemide.    Paroxysmal atrial flutter/s/p Pacemaker.  Stable.  Rate control with amiodarone,  apixaban for anticoagulation.   Diabetes mellitus type 2, uncontrolled with hyperglycemia HbA1c 9.0.  Hyperglycemia is due to steroids.  Hopefully should improve as steroid dose is tapered down.  Continue with SSI, meal coverage as well as long-acting insulin.    Dyslipidemia Continue statin.   CKD stage 3b Renal function is stable.  Monitor urine output.    COPD with chronic hypoxic respiratory failure On oxygen at baseline at 2 L/min.  Continue inhalers.  On mometasone and formoterol.No signs of acute exacerbation.   Transaminitis Elevated AST and ALT most likely due to COVID-19 and remdesivir.  Will recommend rechecking it in few days at the skilled nursing facility.   DVT prophylaxis:enoxaparin Code Status:DNR Family Communication:Will notify his family. Disposition Plan/ discharge barriers:Plan is for discharge to skilled nursing facility tomorrow.  Consultants:   None  Procedures:   None  Antimicrobials:  Anti-infectives (From admission, onward)   Start     Dose/Rate Route Frequency Ordered Stop   09/07/19 1000  remdesivir 100 mg in sodium chloride 0.9 % 100 mL IVPB     100 mg 200 mL/hr over 30 Minutes Intravenous Every 24 hours 09/06/19 1909 09/10/19 0846   09/06/19 2000  remdesivir 200 mg in sodium chloride 0.9% 250 mL IVPB     200 mg 580 mL/hr over 30 Minutes Intravenous Once 09/06/19 1909 09/06/19 2112        Subjective: States that he feels well.  Denies any complaints.  Feels stronger.  Denies any chest pain.  Occasional shortness of breath with occasional cough.  Objective: Vitals:   09/11/19 1610 09/11/19 1940 09/12/19 0500 09/12/19 0806  BP: 132/65   122/69  Pulse: 69   71  Resp: 18 20  18   Temp: 98.4 F (36.9 C) 98.2 F (36.8 C)  (!) 97.4 F (36.3 C)  TempSrc: Oral Oral  Axillary  SpO2: 92% 91%  90%  Weight:  92.5 kg   Height:        Intake/Output Summary (Last 24 hours) at 09/12/2019 1414 Last data filed at 09/12/2019 1402 Gross per 24 hour  Intake 600 ml  Output 1525 ml  Net -925 ml   Filed Weights   09/09/19 0500 09/11/19 0500 09/12/19 0500  Weight: 98.4 kg 97 kg 92.5 kg    Examination:   General appearance: Awake alert.  In no distress Resp: Mildly tachypneic at rest.  Coarse breath sound bilaterally.   Few crackles at the bases.  No wheezing or rhonchi. Cardio: S1-S2 is normal regular.  No S3-S4.  No rubs murmurs or bruit GI: Abdomen is soft.  Nontender nondistended.  Bowel sounds are present normal.  No masses organomegaly Extremities: No edema.  Moving all his extremities Neurologic: Distracted.  No obvious focal neurological deficits.    Data Reviewed: I have personally reviewed following labs and imaging studies  CBC: Recent Labs  Lab 09/06/19 1500 09/07/19 0629 09/08/19 0234  WBC 11.2* 9.8 8.4  NEUTROABS 9.9* 8.6* 6.9  HGB 10.4* 10.0* 9.5*  HCT 32.8* 32.0* 30.7*  MCV 93.7 93.6 93.9  PLT 229 210 [id number]   Basic Metabolic Panel: Recent Labs  Lab 09/07/19 0629 09/08/19 0234 09/09/19 0246 09/10/19 0322 09/11/19 0410  NA 139 139 138 140 134*  K 4.2 3.8 3.7 3.9 3.9  CL 97* 94* 93* 93* 86*  CO2 32 32 33* 35* 35*  GLUCOSE 161* 207* 305* 154* 287*  BUN 45* 59* 68* 71* 74*  CREATININE 1.71* 1.71* 1.65* 1.70* 1.52*  CALCIUM 9.4 8.9 8.9 9.0 8.5*   GFR: Estimated Creatinine Clearance: 40.2 mL/min (A) (by C-G formula based on SCr of 1.52 mg/dL (H)). Liver Function Tests: Recent Labs  Lab 09/07/19 0629 09/08/19 0234 09/09/19 0246 09/10/19 0322 09/11/19 0410  AST 23 22 28  42* 127*  ALT 19 17 25  42 120*  ALKPHOS 70 64 68 72 77  BILITOT 0.6 0.7 0.3 0.5 0.9  PROT 6.6 6.5 6.4* 6.3* 6.3*  ALBUMIN 2.7* 2.7* 2.7* 2.6* 2.8*   CBG: Recent Labs  Lab 09/11/19 1602 09/11/19 2205 09/12/19 0804 09/12/19 1107 09/12/19 1241  GLUCAP 401* 383* 357* 568* 538*   Anemia Panel: Recent Labs    09/10/19 0322 09/11/19 0410  FERRITIN 373* 360*      Radiology Studies: I have reviewed all of the imaging during this hospital visit personally     Scheduled Meds: . albuterol  2 puff Inhalation Q6H  . amiodarone  200 mg Oral Daily  . apixaban  2.5 mg Oral BID  . atorvastatin  20 mg Oral Daily  . chlorpheniramine-HYDROcodone  5 mL Oral Q12H  . citalopram  20 mg Oral Daily   . [START ON 09/13/2019] dexamethasone  4 mg Oral Q24H  . feeding supplement (GLUCERNA SHAKE)  237 mL Oral TID BM  . finasteride  5 mg Oral Daily  . insulin aspart  0-15 Units Subcutaneous TID WC  . insulin aspart  8 Units Subcutaneous TID WC  . insulin detemir  24 Units Subcutaneous BID  . mometasone-formoterol  2 puff Inhalation BID  . multivitamin with minerals  1 tablet Oral Daily  . pantoprazole  40 mg Oral Daily  . sodium chloride flush  3 mL Intravenous Q12H  . tamsulosin  0.4 mg Oral QPC supper  . torsemide  60 mg Oral Daily  . traZODone  50 mg Oral QHS   Continuous Infusions:    LOS: 6 days  Bonnielee Haff, MD Pager: [email or web address]

## 2019-09-12 NOTE — Progress Notes (Signed)
Inpatient Diabetes Program Recommendations  AACE/ADA: New Consensus Statement on Inpatient Glycemic Control (2015)  Target Ranges:  Prepandial:   less than 140 mg/dL      Peak postprandial:   less than 180 mg/dL (1-2 hours)      Critically ill patients:  140 - 180 mg/dL   Lab Results  Component Value Date   [id number] (HH) 09/12/2019   HGBA1C 9.0 (H) 09/07/2019    Review of Glycemic Control Results for Charles [name], Charles [name] (MRN [id number]) as of 09/12/2019 12:40  Ref. Range 09/11/2019 22:05 09/12/2019 08:04 09/12/2019 11:07  Glucose-Capillary Latest Ref Range: 70 - 99 mg/dL 383 (H) 357 (H) 568 (HH)   Diabetes history: Type 2 dm Outpatient Diabetes medications: lantus 20 units qhs, 22 units qam Current orders for Inpatient glycemic control: Novolog 8 units tid, levemir 24 units bid, novolog 0-15 units, novolog 25 units x1 Decadron 4 mg qd Inpatient Diabetes Program Recommendations:    Noted lunch time cbg of 568 mg/dL, this was immediately following meal and nutritional supplement. Noted changes. While inpatient, consider further increasing correction to Novolog 0-20 unit TID.  Thanks, Bronson Curb, MSN, RNC-OB Diabetes Coordinator [phone] (8a-5p)

## 2019-09-12 NOTE — TOC Progression Note (Signed)
Transition of Care [facility]) - Progression Note    Patient Details  Name: Charles [name] MRN: [id number] Date of Birth: 11-Jun-1932  Transition of Care [facility]) CM/SW Contact  Loletha Grayer Beverely Pace, RN Phone Number:  [phone] (working remotely) 09/12/2019, 10:19 AM  Clinical Narrative:   Case manager received call from [facility] with authorization for SNF. [id number], good until 12/11, at which time new review would be needed, Lamont Snowball is reviewer. Clinicals will need to be faxed to [facility] [phone]. Case manager contacted Gerald Stabs with [facility] and provided auth number. Gerald Stabs states bed will not be available until tomorrow, 09/13/19. Investment banker, operational will send DC summary and FL2 on tomorrow. MD Updated.      Expected Discharge Plan: Skilled Nursing Facility Barriers to Discharge: Insurance Authorization  Expected Discharge Plan and Services Expected Discharge Plan: [place]       Living arrangements for the past 2 months: Single Family Home Expected Discharge Date: 09/11/19                                     Social Determinants of Health (SDOH) Interventions    Readmission Risk Interventions Readmission Risk Prevention Plan 08/14/2019  Transportation Screening Complete  PCP or Specialist Appt within 3-5 Days Complete  HRI or [place] Complete  Social Work Consult for [place] Planning/Counseling Complete  Palliative Care Screening Complete  Medication Review Press photographer) Complete  Some recent data might be hidden

## 2019-09-13 DIAGNOSIS — D649 Anemia, unspecified: Secondary | ICD-10-CM | POA: Diagnosis not present

## 2019-09-13 DIAGNOSIS — E1165 Type 2 diabetes mellitus with hyperglycemia: Secondary | ICD-10-CM | POA: Diagnosis not present

## 2019-09-13 DIAGNOSIS — Z79899 Other long term (current) drug therapy: Secondary | ICD-10-CM | POA: Diagnosis not present

## 2019-09-13 DIAGNOSIS — I10 Essential (primary) hypertension: Secondary | ICD-10-CM | POA: Diagnosis not present

## 2019-09-13 DIAGNOSIS — Z7401 Bed confinement status: Secondary | ICD-10-CM | POA: Diagnosis not present

## 2019-09-13 DIAGNOSIS — E119 Type 2 diabetes mellitus without complications: Secondary | ICD-10-CM | POA: Diagnosis not present

## 2019-09-13 DIAGNOSIS — E785 Hyperlipidemia, unspecified: Secondary | ICD-10-CM | POA: Diagnosis not present

## 2019-09-13 DIAGNOSIS — R7401 Elevation of levels of liver transaminase levels: Secondary | ICD-10-CM | POA: Diagnosis not present

## 2019-09-13 DIAGNOSIS — N1832 Chronic kidney disease, stage 3b: Secondary | ICD-10-CM | POA: Diagnosis not present

## 2019-09-13 DIAGNOSIS — M2550 Pain in unspecified joint: Secondary | ICD-10-CM | POA: Diagnosis not present

## 2019-09-13 DIAGNOSIS — U071 COVID-19: Secondary | ICD-10-CM | POA: Diagnosis not present

## 2019-09-13 DIAGNOSIS — J9621 Acute and chronic respiratory failure with hypoxia: Secondary | ICD-10-CM | POA: Diagnosis not present

## 2019-09-13 DIAGNOSIS — K219 Gastro-esophageal reflux disease without esophagitis: Secondary | ICD-10-CM | POA: Diagnosis not present

## 2019-09-13 DIAGNOSIS — I480 Paroxysmal atrial fibrillation: Secondary | ICD-10-CM | POA: Diagnosis not present

## 2019-09-13 DIAGNOSIS — I5033 Acute on chronic diastolic (congestive) heart failure: Secondary | ICD-10-CM | POA: Diagnosis not present

## 2019-09-13 DIAGNOSIS — G4700 Insomnia, unspecified: Secondary | ICD-10-CM | POA: Diagnosis not present

## 2019-09-13 DIAGNOSIS — R262 Difficulty in walking, not elsewhere classified: Secondary | ICD-10-CM | POA: Diagnosis not present

## 2019-09-13 DIAGNOSIS — J1289 Other viral pneumonia: Secondary | ICD-10-CM | POA: Diagnosis not present

## 2019-09-13 DIAGNOSIS — M6281 Muscle weakness (generalized): Secondary | ICD-10-CM | POA: Diagnosis not present

## 2019-09-13 DIAGNOSIS — Z950 Presence of cardiac pacemaker: Secondary | ICD-10-CM | POA: Diagnosis not present

## 2019-09-13 DIAGNOSIS — I5022 Chronic systolic (congestive) heart failure: Secondary | ICD-10-CM | POA: Diagnosis not present

## 2019-09-13 DIAGNOSIS — R6889 Other general symptoms and signs: Secondary | ICD-10-CM | POA: Diagnosis not present

## 2019-09-13 DIAGNOSIS — J449 Chronic obstructive pulmonary disease, unspecified: Secondary | ICD-10-CM | POA: Diagnosis not present

## 2019-09-13 DIAGNOSIS — Z743 Need for continuous supervision: Secondary | ICD-10-CM | POA: Diagnosis not present

## 2019-09-13 LAB — GLUCOSE, CAPILLARY: Glucose-Capillary: 242 mg/dL — ABNORMAL HIGH (ref 70–99)

## 2019-09-13 MED ORDER — GLUCERNA SHAKE PO LIQD: 237.0000 mL | Freq: Three times a day (TID) | ORAL | 0 refills | Status: DC

## 2019-09-13 MED ORDER — DEXAMETHASONE 2 MG PO TABS: ORAL_TABLET | ORAL | 0 refills | Status: DC

## 2019-09-13 MED ORDER — INSULIN ASPART 100 UNIT/ML ~~LOC~~ SOLN: 6.0000 [IU] | Freq: Three times a day (TID) | SUBCUTANEOUS | Status: DC

## 2019-09-13 NOTE — Discharge Instructions (Signed)

## 2019-09-13 NOTE — Progress Notes (Signed)
Attempt one to give report

## 2019-09-13 NOTE — Progress Notes (Signed)
Attempt three to give report. First line picked up, but no one answered when transferred to the nurses station

## 2019-09-13 NOTE — Discharge Summary (Signed)
[facility]  Physician Discharge Summary   Patient ID: Charles [name] MRN: [id number] DOB/AGE: 1932/03/30 85 y.o.  Admit date: 09/06/2019 Discharge date: 09/13/2019  PCP: [name], MD  DISCHARGE DIAGNOSES:  Acute on chronic hypoxic respiratory failure Pneumonia due to [id number] Chronic systolic CHF Paroxysmal atrial flutter Status post pacemaker Diabetes mellitus type 2, uncontrolled with hyperglycemia Dyslipidemia Chronic kidney disease stage IIIb History of COPD Transaminitis  RECOMMENDATIONS FOR OUTPATIENT FOLLOW UP: 1. Please check complete metabolic panel including LFTs in 1 week 2. To monitor CBGs.  Please institute sliding scale coverage as per protocol 3. Meal coverage can be discontinued once patient is off of steroids   Home Health: None Equipment/Devices: None  CODE STATUS: DNR.  MOST form is in the chart.  DISCHARGE CONDITION: fair  Diet recommendation: Heart healthy  INITIAL HISTORY: 85 year old male with significant past medical history for coronary artery disease status post CABG, systolic heart failure, atrial flutter, aortic abdominal aneurysm, severe aortic stenosis status post TAVR, hypertension, dyslipidemia, type 2 diabetes mellitus and chronic hypoxic respiratory failure (2 L/min per nasal cannula) due to COPD.  Recently hospitalized for decompensated CHF.  Went to a skilled nursing facility.  Over there he tested positive for COVID-19.  Over the last few days prior to this admission he has been experiencing worsening shortness of breath.  Oxygen requirements increased.  He was hospitalized for further management.    HOSPITAL COURSE:   Acute hypoxic respiratory failure/Pneumonia due to COVID-19 Patient was treated with remdesivir and steroids.  He has completed course of remdesivir.  His inflammatory markers have improved.  CRP was down to 2.9.  D-dimer 1.20.  Experienced significant hyperglycemia due to steroids.  Steroid dose  being tapered down.  Continue with incentive spirometry mobilization.  Patient noted to be very deconditioned.  He will need to go to skilled nursing facility for rehab.  Currently he is on 3 L of oxygen by nasal cannula.  He was on 2 L/min prior to admission.    Chronic systolic heart failure Stable from a CHF standpoint.  Continue with torsemide.    Paroxysmal atrial flutter/s/p Pacemaker. Stable.  Rate control withamiodarone,  apixaban for anticoagulation.   Diabetes mellitus type 2, uncontrolled with hyperglycemia HbA1c 9.0.  Hyperglycemia is due to steroids.  Hopefully should improve as steroid dose is tapered down.  Continue with SSI, meal coverage as well as long-acting insulin.    Dyslipidemia Continue statin.  CKD stage 3b Renal function is stable.  Monitor urine output.    COPD with chronic hypoxic respiratory failure On oxygen at baseline at 2 L/min.  Continue inhalers.  Onmometasone and formoterol.No signs of acute exacerbation.   Transaminitis Elevated AST and ALT most likely due to COVID-19 and remdesivir.  Will recommend rechecking it in few days at the skilled nursing facility.  Overall stable.  Okay for discharge to skilled nursing facility today.   PERTINENT LABS:  The results of significant diagnostics from this hospitalization (including imaging, microbiology, ancillary and laboratory) are listed below for reference.    Microbiology: Recent Results (from the past 240 hour(s))  Blood Culture (routine x 2)     Status: None   Collection Time: 09/06/19  2:34 PM   Specimen: BLOOD RIGHT FOREARM  Result Value Ref Range Status   Specimen Description BLOOD RIGHT FOREARM  Final   Special Requests   Final    BOTTLES DRAWN AEROBIC AND ANAEROBIC Blood Culture adequate volume   Culture   Final  NO GROWTH 5 DAYS Performed at [facility], [place] [address] [place], [place] [address]    Report Status 09/11/2019 FINAL  Final  Blood Culture (routine x  2)     Status: None   Collection Time: 09/06/19  2:39 PM   Specimen: BLOOD  Result Value Ref Range Status   Specimen Description BLOOD LEFT ANTECUBITAL  Final   Special Requests   Final    BOTTLES DRAWN AEROBIC AND ANAEROBIC Blood Culture results may not be optimal due to an inadequate volume of blood received in culture bottles   Culture   Final    NO GROWTH 5 DAYS Performed at [facility], [place] [address] [place] [address]    Report Status 09/11/2019 FINAL  Final  Culture, blood (single) w Reflex to ID Panel     Status: None   Collection Time: 09/07/19  4:55 AM   Specimen: BLOOD  Result Value Ref Range Status   Specimen Description BLOOD RIGHT ARM  Final   Special Requests   Final    BOTTLES DRAWN AEROBIC AND ANAEROBIC Blood Culture adequate volume   Culture   Final    NO GROWTH 5 DAYS Performed at [facility], [place] [address] [place],  [address]    Report Status 09/12/2019 FINAL  Final     Labs:  COVID-19 Labs  Recent Labs    09/11/19 0410  DDIMER 1.20*  FERRITIN 360*  CRP 2.9*   Positive COVID-19 test result from 09/06/2019.   Basic Metabolic Panel: Recent Labs  Lab 09/07/19 0629 09/08/19 0234 09/09/19 0246 09/10/19 0322 09/11/19 0410  NA 139 139 138 140 134*  K 4.2 3.8 3.7 3.9 3.9  CL 97* 94* 93* 93* 86*  CO2 32 32 33* 35* 35*  GLUCOSE 161* 207* 305* 154* 287*  BUN 45* 59* 68* 71* 74*  CREATININE 1.71* 1.71* 1.65* 1.70* 1.52*  CALCIUM 9.4 8.9 8.9 9.0 8.5*   Liver Function Tests: Recent Labs  Lab 09/07/19 0629 09/08/19 0234 09/09/19 0246 09/10/19 0322 09/11/19 0410  AST 23 22 28  42* 127*  ALT 19 17 25  42 120*  ALKPHOS 70 64 68 72 77  BILITOT 0.6 0.7 0.3 0.5 0.9  PROT 6.6 6.5 6.4* 6.3* 6.3*  ALBUMIN 2.7* 2.7* 2.7* 2.6* 2.8*   CBC: Recent Labs  Lab 09/06/19 1500 09/07/19 0629 09/08/19 0234  WBC 11.2* 9.8 8.4  NEUTROABS 9.9* 8.6* 6.9  HGB 10.4* 10.0* 9.5*  HCT 32.8* 32.0* 30.7*  MCV 93.7 93.6 93.9  PLT  229 210 222   BNP: BNP (last 3 results) Recent Labs    05/24/19 2358 08/13/19 1235 09/06/19 1513  BNP 178.5* 244.0* 413.4*     CBG: Recent Labs  Lab 09/12/19 1241 09/12/19 1451 09/12/19 1742 09/12/19 2115 09/13/19 0742  GLUCAP 538* 507* 323* 118* 242*     IMAGING STUDIES DG Chest Port 1 View  Result Date: 09/06/2019 CLINICAL DATA:  Shortness of breath, chest pain, positive COVID EXAM: PORTABLE CHEST 1 VIEW COMPARISON:  08/13/2019 FINDINGS: Patchy density primarily at the left greater than right lung bases. Small left pleural effusion. No pneumothorax. Stable cardiomediastinal contours. IMPRESSION: Left greater than right basilar pneumonia and atelectasis. Small left pleural effusion. Electronically Signed   By: Macy Mis M.D.   On: 09/06/2019 15:00    DISCHARGE EXAMINATION: Vitals:   09/12/19 0500 09/12/19 0806 09/12/19 2057 09/13/19 0635  BP:  122/69 (!) 159/83 103/66  Pulse:  71  73 75  Resp:  18 18 15   Temp:  (!) 97.4 F (36.3 C) 97.6 F (36.4 C) 98.1 F (36.7 C)  TempSrc:  Axillary Oral Oral  SpO2:  90% 94% 93%  Weight: 92.5 kg     Height:       General appearance: Awake alert.  In no distress Resp: Normal effort at rest.  Improved aeration bilaterally.  Few crackles at the bases.  No wheezing or rhonchi. Cardio: S1-S2 is normal regular.  No S3-S4.  No rubs murmurs or bruit GI: Abdomen is soft.  Nontender nondistended.  Bowel sounds are present normal.  No masses organomegaly    DISPOSITION: SNF  Discharge Instructions    MyChart COVID-19 home monitoring program   Complete by: Sep 11, 2019    Is the patient willing to use the [place] for home monitoring?: Yes   Temperature monitoring   Complete by: Sep 11, 2019    After how many days would you like to receive a notification of this patient's flowsheet entries?: 1   Call MD for:  difficulty breathing, headache or visual disturbances   Complete by: As directed    Call MD for:  extreme  fatigue   Complete by: As directed    Call MD for:  persistant dizziness or light-headedness   Complete by: As directed    Call MD for:  persistant nausea and vomiting   Complete by: As directed    Call MD for:  severe uncontrolled pain   Complete by: As directed    Call MD for:  temperature >100.4   Complete by: As directed    Diet - low sodium heart healthy   Complete by: As directed    Discharge instructions   Complete by: As directed    Continue quarantine for 2 weeks, maintain physical distancing and use a mask in public.   Discharge instructions   Complete by: As directed    Please review instructions on the discharge summary.  COVID 19 INSTRUCTIONS  - You are felt to be stable enough to no longer require inpatient monitoring, testing, and treatment, though you will need to follow the recommendations below: - Based on the CDC's non-test criteria for ending self-isolation: You may not return to work/leave the home until at least 21 days since symptom onset AND 3 days without a fever (without taking tylenol, ibuprofen, etc.) AND have improvement in respiratory symptoms. - Do not take NSAID medications (including, but not limited to, ibuprofen, advil, motrin, naproxen, aleve, goody's powder, etc.) - Follow up with your doctor in the next week via telehealth or seek medical attention right away if your symptoms get WORSE.  - Consider donating plasma after you have recovered (either 14 days after a negative test or 28 days after symptoms have completely resolved) because your antibodies to this virus may be helpful to give to others with life-threatening infections. Please go to the website www.[email or web address] if you would like to consider volunteering for plasma donation.    Directions for you at home:  Wear a facemask You should wear a facemask that covers your nose and mouth when you are in the same room with other people and when you visit a healthcare provider. People who live with  or visit you should also wear a facemask while they are in the same room with you.  Separate yourself from other people in your home As much as possible, you should stay in a different room from other people  in your home. Also, you should use a separate bathroom, if available.  Avoid sharing household items You should not share dishes, drinking glasses, cups, eating utensils, towels, bedding, or other items with other people in your home. After using these items, you should wash them thoroughly with soap and water.  Cover your coughs and sneezes Cover your mouth and nose with a tissue when you cough or sneeze, or you can cough or sneeze into your sleeve. Throw used tissues in a lined trash can, and immediately wash your hands with soap and water for at least 20 seconds or use an alcohol-based hand rub.  Wash your [facility] your hands often and thoroughly with soap and water for at least 20 seconds. You can use an alcohol-based hand sanitizer if soap and water are not available and if your hands are not visibly dirty. Avoid touching your eyes, nose, and mouth with unwashed hands.  Directions for those who live with, or provide care at home for you:  Limit the number of people who have contact with the patient If possible, have only one caregiver for the patient. Other household members should stay in another home or place of residence. If this is not possible, they should stay in another room, or be separated from the patient as much as possible. Use a separate bathroom, if available. Restrict visitors who do not have an essential need to be in the home.  Ensure good ventilation Make sure that shared spaces in the home have good air flow, such as from an air conditioner or an opened window, weather permitting.  Wash your hands often Wash your hands often and thoroughly with soap and water for at least 20 seconds. You can use an alcohol based hand sanitizer if soap and water are not  available and if your hands are not visibly dirty. Avoid touching your eyes, nose, and mouth with unwashed hands. Use disposable paper towels to dry your hands. If not available, use dedicated cloth towels and replace them when they become wet.  Wear a facemask and gloves Wear a disposable facemask at all times in the room and gloves when you touch or have contact with the patient's blood, body fluids, and/or secretions or excretions, such as sweat, saliva, sputum, nasal mucus, vomit, urine, or feces.  Ensure the mask fits over your nose and mouth tightly, and do not touch it during use. Throw out disposable facemasks and gloves after using them. Do not reuse. Wash your hands immediately after removing your facemask and gloves. If your personal clothing becomes contaminated, carefully remove clothing and launder. Wash your hands after handling contaminated clothing. Place all used disposable facemasks, gloves, and other waste in a lined container before disposing them with other household waste. Remove gloves and wash your hands immediately after handling these items.  Do not share dishes, glasses, or other household items with the patient Avoid sharing household items. You should not share dishes, drinking glasses, cups, eating utensils, towels, bedding, or other items with a patient who is confirmed to have, or being evaluated for, COVID-19 infection. After the person uses these items, you should wash them thoroughly with soap and water.  Wash laundry thoroughly Immediately remove and wash clothes or bedding that have blood, body fluids, and/or secretions or excretions, such as sweat, saliva, sputum, nasal mucus, vomit, urine, or feces, on them. Wear gloves when handling laundry from the patient. Read and follow directions on labels of laundry or clothing items and detergent. In  general, wash and dry with the warmest temperatures recommended on the label.  Clean all areas the individual has  used often Clean all touchable surfaces, such as counters, tabletops, doorknobs, bathroom fixtures, toilets, phones, keyboards, tablets, and bedside tables, every day. Also, clean any surfaces that may have blood, body fluids, and/or secretions or excretions on them. Wear gloves when cleaning surfaces the patient has come in contact with. Use a diluted bleach solution (e.g., dilute bleach with 1 part bleach and 10 parts water) or a household disinfectant with a label that says EPA-registered for coronaviruses. To make a bleach solution at home, add 1 tablespoon of bleach to 1 quart (4 cups) of water. For a larger supply, add  cup of bleach to 1 gallon (16 cups) of water. Read labels of cleaning products and follow recommendations provided on product labels. Labels contain instructions for safe and effective use of the cleaning product including precautions you should take when applying the product, such as wearing gloves or eye protection and making sure you have good ventilation during use of the product. Remove gloves and wash hands immediately after cleaning.  Monitor yourself for signs and symptoms of illness Caregivers and household members are considered close contacts, should monitor their health, and will be asked to limit movement outside of the home to the extent possible. Follow the monitoring steps for close contacts listed on the symptom monitoring form.   If you have additional questions, contact your local health department or call the epidemiologist on call at [phone] (available 24/7). This guidance is subject to change. For the most up-to-date guidance from [facility], please refer to their website: YouBlogs.pl   You were cared for by a hospitalist during your hospital stay. If you have any questions about your discharge medications or the care you received while you were in the hospital after you are discharged, you can  call the unit and asked to speak with the hospitalist on call if the hospitalist that took care of you is not available. Once you are discharged, your primary care physician will handle any further medical issues. Please note that NO REFILLS for any discharge medications will be authorized once you are discharged, as it is imperative that you return to your primary care physician (or establish a relationship with a primary care physician if you do not have one) for your aftercare needs so that they can reassess your need for medications and monitor your lab values. If you do not have a primary care physician, you can call [phone] for a physician referral.   Increase activity slowly   Complete by: As directed    Increase activity slowly   Complete by: As directed         Allergies as of 09/13/2019      Reactions   Gabapentin Other (See Comments)   Leg pain, weakness   Metoprolol Swelling   Spironolactone Other (See Comments)   Painful gynecomastia   Amlodipine Other (See Comments)   Edema   Other Other (See Comments)   Horse serum - myalgias   Rosiglitazone Maleate Other (See Comments)    Did not help   Tricor [fenofibrate] Other (See Comments)   myalgias      Medication List    TAKE these medications   Accu-Chek Aviva Plus test strip Generic drug: glucose blood Use as instructed to check blood sugar three times a day. Dx code E11.8 & E11.65   acetaminophen 500 MG tablet Commonly known as: TYLENOL Take 1  tablet (500 mg total) by mouth 3 (three) times daily. What changed:   how much to take  when to take this  reasons to take this   amiodarone 200 MG tablet Commonly known as: PACERONE Take 1 tablet (200 mg total) by mouth daily.   apixaban 2.5 MG Tabs tablet Commonly known as: ELIQUIS Take 1 tablet (2.5 mg total) by mouth 2 (two) times daily.   atorvastatin 20 MG tablet Commonly known as: LIPITOR Take 1 tablet (20 mg total) by mouth daily.   BD Insulin Syringe  U/F 30G X 1/2" 0.5 ML Misc Generic drug: Insulin Syringe-Needle U-100 AS DIRECTED.   budesonide-formoterol 160-4.5 MCG/ACT inhaler Commonly known as: Symbicort Inhale 2 puffs into the lungs 2 (two) times daily.   citalopram 20 MG tablet Commonly known as: CELEXA Take 1 tablet (20 mg total) by mouth daily.   dexamethasone 2 MG tablet Commonly known as: DECADRON Take 2 tablets once daily for 3 days, then 1 tablet once daily for 3 days, then STOP.   feeding supplement (GLUCERNA SHAKE) Liqd Take 237 mLs by mouth 3 (three) times daily between meals.   finasteride 5 MG tablet Commonly known as: PROSCAR Take 1 tablet (5 mg total) by mouth daily.   guaiFENesin 600 MG 12 hr tablet Commonly known as: MUCINEX Take 600 mg by mouth 2 (two) times daily as needed for cough or to loosen phlegm.   insulin aspart 100 UNIT/ML injection Commonly known as: novoLOG Inject 6 Units into the skin 3 (three) times daily with meals for 6 days.   insulin glargine 100 UNIT/ML injection Commonly known as: Lantus Lantus 22 units in the morning and 20 units at bedtime.   ipratropium-albuterol 0.5-2.5 (3) MG/3ML Soln Commonly known as: DUONEB Take 3 mLs by nebulization 2 (two) times daily.   lisinopril 10 MG tablet Commonly known as: ZESTRIL Take 1 tablet (10 mg total) by mouth daily.   nitroGLYCERIN 0.4 MG SL tablet Commonly known as: NITROSTAT Place 1 tablet (0.4 mg total) under the tongue every 5 (five) minutes as needed for chest pain.   pantoprazole 40 MG tablet Commonly known as: PROTONIX Take 1 tablet (40 mg total) by mouth daily. Take 1 tablet (20 mg total) by mouth daily.   potassium chloride SA 20 MEQ tablet Commonly known as: KLOR-CON Take 1 tablet (20 mEq total) every Monday, Wednesday, Friday, Saturday, and Sunday. What changed:   how much to take  how to take this  when to take this   tamsulosin 0.4 MG Caps capsule Commonly known as: FLOMAX Take 1 capsule (0.4 mg total) by  mouth daily after supper.   torsemide 20 MG tablet Commonly known as: DEMADEX Take 3 tablets (60 mg total) by mouth daily.   traZODone 50 MG tablet Commonly known as: DESYREL Take 1 tablet (50 mg total) by mouth at bedtime.   VITAMIN C PO Take 1 tablet by mouth daily.        Follow-up Information    [name], MD Follow up in 2 week(s).   Specialty: Family Medicine Contact information: [place] [address] [phone]           TOTAL DISCHARGE TIME: 39 minutes  [place]  [facility] Pager on [email or web address]  09/13/2019, 10:31 AM

## 2019-09-13 NOTE — Progress Notes (Signed)
Attempt two to give report. No answer

## 2019-09-13 NOTE — TOC Transition Note (Signed)
Transition of Care [facility]) - CM/SW Discharge Note   Patient Details  Name: Charles [name] MRN: [id number] Date of Birth: 12-20-1931  Transition of Care [facility]) CM/SW Contact:  Amador Cunas, [place] Phone Number: 09/13/2019, 11:12 AM   Clinical Narrative:   Pt for dc to [facility] today. Spoke to pt's dtr-in-law, [name], who reports agreeable to d/c. Confirmed with Gerald Stabs at [facility] they are prepared to admit pt to room 208 today. RN to call report to [phone]. SW signing off at d/c.   Wandra Feinstein, MSW, LCSW [phone] (GV coverage)       Final next level of care: Skilled Nursing Facility Barriers to Discharge: No Barriers Identified   Patient Goals and CMS Choice Patient states their goals for this hospitalization and ongoing recovery are:: return home CMS Medicare.gov Compare Post Acute Care list provided to:: Other (Comment Required)(Dtr) Choice offered to / list presented to : Adult Children  Discharge Placement              Patient chooses bed at: [facility] Patient to be transferred to facility by: [place] Name of family member notified: [name] Patient and family notified of of transfer: 09/13/19  Discharge Plan and Services                                     Social Determinants of Health (SDOH) Interventions     Readmission Risk Interventions Readmission Risk Prevention Plan 08/14/2019  Transportation Screening Complete  PCP or Specialist Appt within 3-5 Days Complete  HRI or [place] Complete  Social Work Consult for [place] Planning/Counseling Complete  Palliative Care Screening Complete  Medication Review [employer]) Complete  Some recent data might be hidden

## 2019-09-16 DIAGNOSIS — J449 Chronic obstructive pulmonary disease, unspecified: Secondary | ICD-10-CM | POA: Diagnosis not present

## 2019-09-16 DIAGNOSIS — I480 Paroxysmal atrial fibrillation: Secondary | ICD-10-CM | POA: Diagnosis not present

## 2019-09-16 DIAGNOSIS — U071 COVID-19: Secondary | ICD-10-CM | POA: Diagnosis not present

## 2019-09-16 DIAGNOSIS — N1832 Chronic kidney disease, stage 3b: Secondary | ICD-10-CM | POA: Diagnosis not present

## 2019-09-21 LAB — COMPREHENSIVE METABOLIC PANEL
Albumin: 2.8 — AB (ref 3.5–5.0)
Calcium: 8.2 — AB (ref 8.7–10.7)

## 2019-09-21 LAB — HEPATIC FUNCTION PANEL
ALT: 56 — AB (ref 10–40)
AST: 27 (ref 14–40)
Alkaline Phosphatase: 93 (ref 25–125)
Bilirubin, Total: 0.3

## 2019-09-21 LAB — CBC AND DIFFERENTIAL
Hemoglobin: 10.5 — AB (ref 13.5–17.5)
Platelets: 130 — AB (ref 150–399)
WBC: 6.1

## 2019-09-21 LAB — LIPID PANEL
Cholesterol: 123 (ref 0–200)
HDL: 34 — AB (ref 35–70)
LDL Cholesterol: 72
Triglycerides: 87 (ref 40–160)

## 2019-09-21 LAB — BASIC METABOLIC PANEL
Creatinine: 1.5 — AB (ref 0.6–1.3)
Glucose: 238
Potassium: 4.2 (ref 3.4–5.3)
Sodium: 137 (ref 137–147)

## 2019-09-21 LAB — HEMOGLOBIN A1C: Hemoglobin A1C: 9.3

## 2019-09-24 ENCOUNTER — Telehealth: Payer: Self-pay | Admitting: Adult Health Nurse Practitioner

## 2019-09-24 NOTE — Telephone Encounter (Signed)
Called facility about patient.  Was told that he discharged from facility on 09/06/2019 Inda Mcglothen K. Olena Heckle NP

## 2019-09-25 ENCOUNTER — Telehealth: Payer: Self-pay

## 2019-09-25 ENCOUNTER — Ambulatory Visit: Payer: Medicare Other | Admitting: Cardiology

## 2019-09-25 NOTE — Telephone Encounter (Signed)
Spoke with Claiborne Rigg, daughter-in-law, per DPR, and she stated patient will be coming home from nursing home tomorrow and she would prefer he come in January because she is his transportation and bring him to all his appointments.

## 2019-09-26 DIAGNOSIS — J449 Chronic obstructive pulmonary disease, unspecified: Secondary | ICD-10-CM | POA: Diagnosis not present

## 2019-09-26 DIAGNOSIS — I480 Paroxysmal atrial fibrillation: Secondary | ICD-10-CM | POA: Diagnosis not present

## 2019-09-26 DIAGNOSIS — U071 COVID-19: Secondary | ICD-10-CM | POA: Diagnosis not present

## 2019-09-26 DIAGNOSIS — I5022 Chronic systolic (congestive) heart failure: Secondary | ICD-10-CM | POA: Diagnosis not present

## 2019-09-27 ENCOUNTER — Ambulatory Visit (INDEPENDENT_AMBULATORY_CARE_PROVIDER_SITE_OTHER): Payer: Medicare Other | Admitting: *Deleted

## 2019-09-27 DIAGNOSIS — Z950 Presence of cardiac pacemaker: Secondary | ICD-10-CM

## 2019-09-27 LAB — CUP PACEART REMOTE DEVICE CHECK
Battery Remaining Longevity: 104 mo
Battery Remaining Percentage: 95.5 %
Battery Voltage: 3.02 V
Brady Statistic AP VP Percent: 1 %
Brady Statistic AP VS Percent: 1 %
Brady Statistic AS VP Percent: 99 %
Brady Statistic AS VS Percent: 1 %
Brady Statistic RA Percent Paced: 1 %
Brady Statistic RV Percent Paced: 96 %
Date Time Interrogation Session: 20201223224315
Implantable Lead Implant Date: 20190719
Implantable Lead Implant Date: 20190719
Implantable Lead Location: 753859
Implantable Lead Location: 753860
Implantable Lead Model: 5076
Implantable Lead Model: 5076
Implantable Pulse Generator Implant Date: 20190719
Lead Channel Impedance Value: 390 Ohm
Lead Channel Impedance Value: 410 Ohm
Lead Channel Pacing Threshold Amplitude: 0.5 V
Lead Channel Pacing Threshold Amplitude: 0.5 V
Lead Channel Pacing Threshold Pulse Width: 0.5 ms
Lead Channel Pacing Threshold Pulse Width: 0.5 ms
Lead Channel Sensing Intrinsic Amplitude: 12 mV
Lead Channel Sensing Intrinsic Amplitude: 5 mV
Lead Channel Setting Pacing Amplitude: 1.5 V
Lead Channel Setting Pacing Amplitude: 2.5 V
Lead Channel Setting Pacing Pulse Width: 0.5 ms
Lead Channel Setting Sensing Sensitivity: 2 mV
Pulse Gen Model: 2272
Pulse Gen Serial Number: 9040268

## 2019-09-30 NOTE — Progress Notes (Signed)
PPM remote 

## 2019-10-01 ENCOUNTER — Telehealth: Payer: Self-pay

## 2019-10-01 DIAGNOSIS — J440 Chronic obstructive pulmonary disease with acute lower respiratory infection: Secondary | ICD-10-CM | POA: Diagnosis not present

## 2019-10-01 DIAGNOSIS — J1289 Other viral pneumonia: Secondary | ICD-10-CM | POA: Diagnosis not present

## 2019-10-01 DIAGNOSIS — J441 Chronic obstructive pulmonary disease with (acute) exacerbation: Secondary | ICD-10-CM | POA: Diagnosis not present

## 2019-10-01 DIAGNOSIS — J9621 Acute and chronic respiratory failure with hypoxia: Secondary | ICD-10-CM | POA: Diagnosis not present

## 2019-10-01 DIAGNOSIS — U071 COVID-19: Secondary | ICD-10-CM | POA: Diagnosis not present

## 2019-10-01 NOTE — Telephone Encounter (Signed)
Charles [name] with [facility] request verbal orders for [name] 2 x a wk for 4 wks and 1x a wk for 3 wks. Also request orders for OT and skilled nursing evaluations.  [name] [facility] and [name] has had dizziness upon standing since discharged on 09/26/19. If [name] is sitting still no dizziness; the room does not spin only lightheaded when stands. Pts' legs also feel weak when standing. After few mins when standing the lightheadedness and weak legs go away. [name] will be careful in standing and [name] is using a rolator walker also.BP earlier was 90/60; Lemuel just took BP with [name] standing and BP 90/60 P91. [name] sat down and rested few mins and BP sitting 100/60. UC & ED precautions given and Charles Simmer and [name] both voiced understanding. [name] request cb after reviewed by Dr Darnell Level; OK if cb is on 10/02/19 with any recommendations.

## 2019-10-02 ENCOUNTER — Telehealth (INDEPENDENT_AMBULATORY_CARE_PROVIDER_SITE_OTHER): Payer: Medicare Other | Admitting: Family Medicine

## 2019-10-02 ENCOUNTER — Encounter: Payer: Self-pay | Admitting: Family Medicine

## 2019-10-02 ENCOUNTER — Other Ambulatory Visit: Payer: Self-pay

## 2019-10-02 VITALS — BP 87/52 | HR 88 | Ht 73.0 in | Wt 227.0 lb

## 2019-10-02 DIAGNOSIS — N401 Enlarged prostate with lower urinary tract symptoms: Secondary | ICD-10-CM

## 2019-10-02 DIAGNOSIS — J441 Chronic obstructive pulmonary disease with (acute) exacerbation: Secondary | ICD-10-CM | POA: Diagnosis not present

## 2019-10-02 DIAGNOSIS — J9610 Chronic respiratory failure, unspecified whether with hypoxia or hypercapnia: Secondary | ICD-10-CM

## 2019-10-02 DIAGNOSIS — J440 Chronic obstructive pulmonary disease with acute lower respiratory infection: Secondary | ICD-10-CM | POA: Diagnosis not present

## 2019-10-02 DIAGNOSIS — J1289 Other viral pneumonia: Secondary | ICD-10-CM | POA: Diagnosis not present

## 2019-10-02 DIAGNOSIS — E1122 Type 2 diabetes mellitus with diabetic chronic kidney disease: Secondary | ICD-10-CM

## 2019-10-02 DIAGNOSIS — I5032 Chronic diastolic (congestive) heart failure: Secondary | ICD-10-CM

## 2019-10-02 DIAGNOSIS — E118 Type 2 diabetes mellitus with unspecified complications: Secondary | ICD-10-CM | POA: Diagnosis not present

## 2019-10-02 DIAGNOSIS — J9621 Acute and chronic respiratory failure with hypoxia: Secondary | ICD-10-CM | POA: Diagnosis not present

## 2019-10-02 DIAGNOSIS — J1282 Pneumonia due to coronavirus disease 2019: Secondary | ICD-10-CM

## 2019-10-02 DIAGNOSIS — U071 COVID-19: Secondary | ICD-10-CM | POA: Diagnosis not present

## 2019-10-02 DIAGNOSIS — Z950 Presence of cardiac pacemaker: Secondary | ICD-10-CM

## 2019-10-02 DIAGNOSIS — IMO0002 Reserved for concepts with insufficient information to code with codable children: Secondary | ICD-10-CM

## 2019-10-02 DIAGNOSIS — E440 Moderate protein-calorie malnutrition: Secondary | ICD-10-CM

## 2019-10-02 DIAGNOSIS — I10 Essential (primary) hypertension: Secondary | ICD-10-CM

## 2019-10-02 DIAGNOSIS — E1165 Type 2 diabetes mellitus with hyperglycemia: Secondary | ICD-10-CM

## 2019-10-02 DIAGNOSIS — Z952 Presence of prosthetic heart valve: Secondary | ICD-10-CM

## 2019-10-02 DIAGNOSIS — I959 Hypotension, unspecified: Secondary | ICD-10-CM

## 2019-10-02 DIAGNOSIS — N183 Chronic kidney disease, stage 3 unspecified: Secondary | ICD-10-CM

## 2019-10-02 MED ORDER — LISINOPRIL 10 MG PO TABS: 10.0000 mg | ORAL_TABLET | Freq: Every day | ORAL | 6 refills | Status: AC

## 2019-10-02 NOTE — Telephone Encounter (Signed)
Agree with below verbal orders.  May need to decrease BP med regimen (on lisinopril 10mg  and torsemide 60mg  daily) but would want cards input about any dose change - recommend they contact cardiology about blood pressures.  How are urinary symptoms ie nocturia, hesitancy, incomplete emptying? If urinary symptoms improving, may trial off flomax (which could contribute to orthostatic hypotension).

## 2019-10-02 NOTE — Telephone Encounter (Signed)
[name] returning my call.  I relayed Dr. Synthia Innocent message about pt's BP.  Verbalizes understanding.  I asked about urinary sxs.  States she has not noticed and pt has not c/o any urinary sxs.  Says pt gets up maybe twice at night to use restroom but no complaints.  Fyi to Dr. Darnell Level.

## 2019-10-02 NOTE — Telephone Encounter (Signed)
Spoke with Charles [name] informing him Dr. G is giving verbal orders for services requested.  

## 2019-10-02 NOTE — Telephone Encounter (Signed)
Lvm for pt's son, [name] or daughter-in-law, [name] (both on dpr) to call back.  Need to relay Dr. Synthia Innocent message about pt's BP and get answers to questions about urinary issues.

## 2019-10-02 NOTE — Progress Notes (Signed)
Virtual visit completed through [place]. Due to national recommendations of social distancing due to COVID-19, a virtual visit is felt to be most appropriate for this patient at this time. Reviewed limitations of a virtual visit.   Patient location: home Provider location: [place] at [facility], office If any vitals were documented, they were collected by patient at home unless specified below.    BP (!) 87/52   Pulse 88   Ht 6\' 1"  (1.854 m)   Wt 227 lb (103 kg)   SpO2 92%   BMI 29.95 kg/m    CC: [facility]hab [facility]) discharge  Subjective:    Patient ID: Charles [name], male    DOB: 1931/12/28, 85 y.o.   MRN: [id number]  HPI: Charles [name] is a 85 y.o. male presenting on 10/02/2019 for Hospitalization Follow-up (Rehab f/u.  BS was 178 this morning. )   Daughter in law [name] also on call.   Initial hospitalization 12/3-07/2019 for acute respiratory failure in setting of Covid-19 pneumonia. Treated with remdesivir and decadron. Did experience significant hyperglycemia while on steroids, managed with mealtime coverage insulin.   Stayed at rehab from 12/10-23/2020, then discharged back home. Admitted due to weakness after hospitalization for short term OT/PT.  All records reviewed.   [facility] currently involved.   DM - taking lantus 22/20u daily. Checks twice daily. This morning 176. Last night 196.  Lab Results  Component Value Date   HGBA1C 9.3 09/21/2019     Admit date: 09/06/2019 Discharge date: 09/13/2019 No TCM hosp f/u phone call completed  DISCHARGE DIAGNOSES:  Acute on chronic hypoxic respiratory failure Pneumonia due to [id number] Chronic systolic CHF Paroxysmal atrial flutter Status post pacemaker Diabetes mellitus type 2, uncontrolled with hyperglycemia Dyslipidemia Chronic kidney disease stage IIIb History of COPD Transaminitis  RECOMMENDATIONS FOR OUTPATIENT FOLLOW UP: 1. Please check complete metabolic panel including LFTs in 1  week 2. To monitor CBGs.  Please institute sliding scale coverage as per protocol 3. Meal coverage can be discontinued once patient is off of steroids  Home Health: None Equipment/Devices: None CODE STATUS: DNR.  MOST form is in the chart. DISCHARGE CONDITION: fair Diet recommendation: Heart healthy     Relevant past medical, surgical, family and social history reviewed and updated as indicated. Interim medical history since our last visit reviewed. Allergies and medications reviewed and updated. Outpatient Medications Prior to Visit  Medication Sig Dispense Refill  . acetaminophen (TYLENOL) 500 MG tablet Take 1 tablet (500 mg total) by mouth 3 (three) times daily. (Patient taking differently: Take 1,000 mg by mouth every 8 (eight) hours as needed for mild pain or headache. )    . amiodarone (PACERONE) 200 MG tablet Take 1 tablet (200 mg total) by mouth daily. 30 tablet 1  . apixaban (ELIQUIS) 2.5 MG TABS tablet Take 1 tablet (2.5 mg total) by mouth 2 (two) times daily. 60 tablet 3  . Ascorbic Acid (VITAMIN C PO) Take 1 tablet by mouth daily.    [name] atorvastatin (LIPITOR) 20 MG tablet Take 1 tablet (20 mg total) by mouth daily. 90 tablet 3  . BD INSULIN SYRINGE U/F 30G X 1/2" 0.5 ML MISC AS DIRECTED. 100 each 0  . budesonide-formoterol (SYMBICORT) 160-4.5 MCG/ACT inhaler Inhale 2 puffs into the lungs 2 (two) times daily. 1 Inhaler 2  . citalopram (CELEXA) 20 MG tablet Take 1 tablet (20 mg total) by mouth daily. 90 tablet 2  . feeding supplement, GLUCERNA SHAKE, (GLUCERNA SHAKE) LIQD Take  237 mLs by mouth 3 (three) times daily between meals.  0  . finasteride (PROSCAR) 5 MG tablet Take 1 tablet (5 mg total) by mouth daily. 30 tablet 5  . glucose blood (ACCU-CHEK AVIVA PLUS) test strip Use as instructed to check blood sugar three times a day. Dx code E11.8 & E11.65 300 strip 1  . guaiFENesin (MUCINEX) 600 MG 12 hr tablet Take 600 mg by mouth 2 (two) times daily as needed for cough or to loosen  phlegm.    . insulin glargine (LANTUS) 100 UNIT/ML injection Lantus 22 units in the morning and 20 units at bedtime. 30 mL 0  . ipratropium-albuterol (DUONEB) 0.5-2.5 (3) MG/3ML SOLN Take 3 mLs by nebulization 2 (two) times daily. 360 mL 1  . nitroGLYCERIN (NITROSTAT) 0.4 MG SL tablet Place 1 tablet (0.4 mg total) under the tongue every 5 (five) minutes as needed for chest pain. 90 tablet 3  . pantoprazole (PROTONIX) 40 MG tablet Take 1 tablet (40 mg total) by mouth daily. Take 1 tablet (20 mg total) by mouth daily. 30 tablet 1  . potassium chloride SA (K-DUR) 20 MEQ tablet Take 1 tablet (20 mEq total) every Monday, Wednesday, Friday, Saturday, and Sunday. (Patient taking differently: Take 20 mEq by mouth See admin instructions. Take 1 tablet (20 mEq total) every Monday, Wednesday, Friday, Saturday, and Sunday.) 30 tablet 11  . torsemide (DEMADEX) 20 MG tablet Take 3 tablets (60 mg total) by mouth daily. 360 tablet 0  . traZODone (DESYREL) 50 MG tablet Take 1 tablet (50 mg total) by mouth at bedtime. 30 tablet 0  . dexamethasone (DECADRON) 2 MG tablet Take 2 tablets once daily for 3 days, then 1 tablet once daily for 3 days, then STOP. 9 tablet 0  . tamsulosin (FLOMAX) 0.4 MG CAPS capsule Take 1 capsule (0.4 mg total) by mouth daily after supper. 30 capsule 5  . insulin aspart (NOVOLOG) 100 UNIT/ML injection Inject 6 Units into the skin 3 (three) times daily with meals for 6 days.    [name] lisinopril (ZESTRIL) 10 MG tablet Take 1 tablet (10 mg total) by mouth daily. 30 tablet 0   No facility-administered medications prior to visit.     Per HPI unless specifically indicated in ROS section below Review of Systems Objective:    BP (!) 87/52   Pulse 88   Ht 6\' 1"  (1.854 m)   Wt 227 lb (103 kg)   SpO2 92%   BMI 29.95 kg/m   Wt Readings from Last 3 Encounters:  10/02/19 227 lb (103 kg)  09/12/19 203 lb 14.8 oz (92.5 kg)  08/20/19 223 lb 11.2 oz (101.5 kg)     Physical exam: Gen: alert, NAD,  not ill appearing Pulm: speaks in complete sentences without increased work of breathing Psych: normal mood, normal thought content      Lab Results  Component Value Date   CREATININE 1.5 (A) 09/21/2019   BUN 74 (H) 09/11/2019   NA 137 09/21/2019   K 4.2 09/21/2019   CL 86 (L) 09/11/2019   CO2 35 (H) 09/11/2019    Assessment & Plan:   Problem List Items Addressed This Visit    S/P TAVR (transcatheter aortic valve replacement) (Chronic)   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 [employer] (Chronic)   Protein-calorie malnutrition, moderate (HCC)    As evidenced by latest albumin levels. Reviewed glucerna supplements and importance of nutritious diet.       Pneumonia due to COVID-19 virus -  Primary    Reviewed recent hospitalization, treatment regimen of remdesivir and decadron with benefit.       Hypotension    There was confusion on lisinopril dosing - dose was decreased to 10mg  during latest hospitalization, lower dose was continued at rehab facility however upon discharge home they had accidentally restarted 40mg  dose from previous - advised start 10mg  lisinopril - refilled today.       Relevant Medications   lisinopril (ZESTRIL) 10 MG tablet   Essential hypertension    Deteriorated due to confusion on lisinopril dosing - see below      Relevant Medications   lisinopril (ZESTRIL) 10 MG tablet   Diabetes mellitus type 2, uncontrolled, with complications (HCC)    Recent steroid induced hyperglycemia due to recent decadron use for Covid pneumonia - anticipate improvement as he has now completed steroid taper. Currently on lantus 22/20 units daily, currently off novolog mealtime coverage, will keep me updated on glycemic control. Reviewed possible titration of lantus based on sugar levels to max 25u BID if fasting sugars averaging >150.       Relevant Medications   lisinopril (ZESTRIL) 10 MG tablet   CKD stage 3 due to type 2 diabetes mellitus ([place])    Reviewed recent labs  from rehab - Cr 1.47.  Progression over the past year, new baseline seems around Cr 1.5.       Relevant Medications   lisinopril (ZESTRIL) 10 MG tablet   Chronic respiratory failure (HCC)   Chronic heart failure with preserved ejection fraction (HFpEF) (HCC)    Stable period.       Relevant Medications   lisinopril (ZESTRIL) 10 MG tablet   BPH (benign prostatic hyperplasia)    No recent urinary symptoms of urgency or nocturia - has been on finasteride for a good portion of time - will stop flomax given concern over hypotension.           Meds ordered this encounter  Medications  . lisinopril (ZESTRIL) 10 MG tablet    Sig: Take 1 tablet (10 mg total) by mouth daily.    Dispense:  30 tablet    Refill:  6   No orders of the defined types were placed in this encounter.   I discussed the assessment and treatment plan with the patient. The patient was provided an opportunity to ask questions and all were answered. The patient agreed with the plan and demonstrated an understanding of the instructions. The patient was advised to call back or seek an in-person evaluation if the symptoms worsen or if the condition fails to improve as anticipated.  Follow up plan: No follow-ups on file.  [name], MD

## 2019-10-03 ENCOUNTER — Encounter: Payer: Self-pay | Admitting: Family Medicine

## 2019-10-04 ENCOUNTER — Encounter: Payer: Self-pay | Admitting: Family Medicine

## 2019-10-04 DIAGNOSIS — E440 Moderate protein-calorie malnutrition: Secondary | ICD-10-CM | POA: Insufficient documentation

## 2019-10-04 DIAGNOSIS — I959 Hypotension, unspecified: Secondary | ICD-10-CM | POA: Insufficient documentation

## 2019-10-04 NOTE — Assessment & Plan Note (Signed)
Reviewed recent hospitalization, treatment regimen of remdesivir and decadron with benefit.

## 2019-10-04 NOTE — Assessment & Plan Note (Deleted)
Reviewed recent labs from rehab - Cr 1.47.

## 2019-10-04 NOTE — Assessment & Plan Note (Addendum)
As evidenced by latest albumin levels. Reviewed glucerna supplements and importance of nutritious diet.

## 2019-10-04 NOTE — Assessment & Plan Note (Addendum)
Recent steroid induced hyperglycemia due to recent decadron use for Covid pneumonia - anticipate improvement as he has now completed steroid taper. Currently on lantus 22/20 units daily, currently off novolog mealtime coverage, will keep me updated on glycemic control. Reviewed possible titration of lantus based on sugar levels to max 25u BID if fasting sugars averaging >150.

## 2019-10-04 NOTE — Assessment & Plan Note (Signed)
No recent urinary symptoms of urgency or nocturia - has been on finasteride for a good portion of time - will stop flomax given concern over hypotension.

## 2019-10-04 NOTE — Assessment & Plan Note (Signed)
Reviewed recent labs from rehab - Cr 1.47.  Progression over the past year, new baseline seems around Cr 1.5.

## 2019-10-04 NOTE — Assessment & Plan Note (Signed)
There was confusion on lisinopril dosing - dose was decreased to 10mg  during latest hospitalization, lower dose was continued at rehab facility however upon discharge home they had accidentally restarted 40mg  dose from previous - advised start 10mg  lisinopril - refilled today.

## 2019-10-04 NOTE — Assessment & Plan Note (Signed)
Stable period.  

## 2019-10-04 NOTE — Assessment & Plan Note (Signed)
Deteriorated due to confusion on lisinopril dosing - see below

## 2019-10-05 DIAGNOSIS — J9621 Acute and chronic respiratory failure with hypoxia: Secondary | ICD-10-CM | POA: Diagnosis not present

## 2019-10-05 DIAGNOSIS — J440 Chronic obstructive pulmonary disease with acute lower respiratory infection: Secondary | ICD-10-CM | POA: Diagnosis not present

## 2019-10-05 DIAGNOSIS — J441 Chronic obstructive pulmonary disease with (acute) exacerbation: Secondary | ICD-10-CM | POA: Diagnosis not present

## 2019-10-08 ENCOUNTER — Other Ambulatory Visit: Payer: Self-pay

## 2019-10-08 ENCOUNTER — Encounter: Payer: Self-pay | Admitting: Cardiology

## 2019-10-08 ENCOUNTER — Ambulatory Visit: Payer: Medicare Other | Admitting: Cardiology

## 2019-10-08 ENCOUNTER — Telehealth: Payer: Self-pay | Admitting: Cardiology

## 2019-10-08 VITALS — BP 110/68 | HR 89 | Temp 97.3°F | Ht 73.0 in | Wt 219.0 lb

## 2019-10-08 DIAGNOSIS — U071 COVID-19: Secondary | ICD-10-CM

## 2019-10-08 DIAGNOSIS — N183 Chronic kidney disease, stage 3 unspecified: Secondary | ICD-10-CM

## 2019-10-08 DIAGNOSIS — I2510 Atherosclerotic heart disease of native coronary artery without angina pectoris: Secondary | ICD-10-CM

## 2019-10-08 DIAGNOSIS — I4892 Unspecified atrial flutter: Secondary | ICD-10-CM | POA: Diagnosis not present

## 2019-10-08 DIAGNOSIS — Z952 Presence of prosthetic heart valve: Secondary | ICD-10-CM

## 2019-10-08 DIAGNOSIS — J9610 Chronic respiratory failure, unspecified whether with hypoxia or hypercapnia: Secondary | ICD-10-CM

## 2019-10-08 DIAGNOSIS — Z950 Presence of cardiac pacemaker: Secondary | ICD-10-CM

## 2019-10-08 DIAGNOSIS — E1122 Type 2 diabetes mellitus with diabetic chronic kidney disease: Secondary | ICD-10-CM

## 2019-10-08 DIAGNOSIS — J1282 Pneumonia due to coronavirus disease 2019: Secondary | ICD-10-CM

## 2019-10-08 DIAGNOSIS — I5033 Acute on chronic diastolic (congestive) heart failure: Secondary | ICD-10-CM | POA: Diagnosis not present

## 2019-10-08 NOTE — Telephone Encounter (Signed)
I spoke with the patient's daughter-in-law [name] who monitors and lays out his medications.  His creatinine is 1.5, similar to his hospital creatinine.  I suggested they increase his torsemide to 60 mg in the morning and 40 mg in the afternoon until his weight drifts back down to his discharge weight.  She says that he did gain about 4 5 pounds since discharge.  If his weight comes back to his discharge weight then he could back off and go back to 60 mg of torsemide daily.  He does have a follow-up in the heart failure clinic in a couple weeks and will keep this.  Kerin Ransom PA-C 10/08/2019 5:17 PM

## 2019-10-08 NOTE — Assessment & Plan Note (Signed)
Hospitalization 09/2019

## 2019-10-08 NOTE — Patient Instructions (Signed)
Medication Instructions:  Your physician recommends that you continue on your current medications as directed. Please refer to the Current Medication list given to you today. *If you need a refill on your cardiac medications before your next appointment, please call your pharmacy*  Lab Work: Your physician recommends that you return for lab work in: [employer], BMET, BNP If you have labs (blood work) drawn today and your tests are completely normal, you will receive your results only by: [name] MyChart Message (if you have MyChart) OR . A paper copy in the mail If you have any lab test that is abnormal or we need to change your treatment, we will call you to review the results.  Testing/Procedures: NONE   Follow-Up: At [facility], you and your health needs are our priority.  As part of our continuing mission to provide you with exceptional heart care, we have created designated Provider Care Teams.  These Care Teams include your primary Cardiologist (physician) and Advanced Practice Providers (APPs -  Physician Assistants and Nurse Practitioners) who all work together to provide you with the care you need, when you need it.  Your next appointment:   FOLLOW AS SCHEDULED  The format for your next appointment:   In Person  Provider:   [place]  Other Instructions

## 2019-10-08 NOTE — Progress Notes (Signed)
Cardiology Office Note:    Date:  10/08/2019   ID:  Charles [name], DOB 03-Aug-1932, MRN [id number]  PCP:  [name], MD  Cardiologist:  [name], MD  Electrophysiologist:  None   Referring MD: [name], MD   Chief Complaint  Patient presents with  . Follow-up    Post hospital.  . Edema    History of Present Illness:    Charles [name] is a 86 y.o. male with a hx of chronic CHF with frequent decompensations and admissions, CAD s/p CABG x 4 (LIMA to LAD, SVG to RCA, OM branch to ramus) in 2004, s/p TAVR 12/22/2018, PPM July 2019, HTN, HLD, COPD on chronic O2, PAF/ flutter s/p TEE cardioversion in the remote past, DDM, and renal artery stenosis with 70% bilateral stenosis.  His last echo was nov 2020 and showed and EF of 60-65% with mild LVH, grade 2 DD, and pulmonary HTN with PA  40-45 mmHg.   He is in the office today with complaints of LE edema and increased SOB.  He was just admitted 12/03- 09/11/2019 with COVID pneumonia. Cardiology did not see him during that admission. He was discharged to SNF but is now at home.  His daughter accompanied him to the office. Since discharge he complains of increasing LE edema and the patient says he is more SOB than he was when discharged.  He was not felt to have had CHF while hospitalized.  His diuretic dose is unchanged.  His weights in the hospital varied but he did have two similar weights of 227 lbs and 223 lbs.  His weight today is 219 lbs.   Past Medical History:  Diagnosis Date  . Abdominal aortic atherosclerosis ([place])    by xray  . Arthritis    in lower back (06/24/2015)  . Atrial flutter ([place])    notes 06/24/2015  . AV block, [place] 1 09/06/2017   Archie Endo 09/06/2017  . 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 CAD (coronary artery disease) 2004   a. s/p CABG in 2004 with LIMA-LAD, SVG-RCA, SVG-OM, and SVG-RI  . Carotid stenosis 1999   s/p L CEA  . Chronic diastolic CHF (congestive heart  failure) ([place])   . Colon polyp 2005   (Dr. Tiffany Kocher)  . Compression fracture of L1 lumbar vertebra (HCC) remote  . COPD (chronic obstructive pulmonary disease) ([place]) 03/2011   by xray  . COVID-19 09/06/2019  . Depression    hx  . GERD (gastroesophageal reflux disease) 2003   h/o duodenal ulcer per EGD as well as esophagitis  . History of colon polyps 2003, 2005   adenomatous Vira Agar)  . History of tobacco abuse   . HLD (hyperlipidemia)   . HTN (hypertension)   . Pneumonia due to COVID-19 virus 09/07/2019   Hospitalization 09/2019  . Presence of permanent cardiac pacemaker   . Psoriasis   . Renal artery stenosis ([place]) 2004   70% bilateral, followed by cards  . 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 Severe aortic stenosis   . T2DM (type 2 diabetes mellitus) ([place]) 1995  . Urge incontinence of urine     Past Surgical History:  Procedure Laterality Date  . CARDIAC CATHETERIZATION  03/06/2003   No intervention - recommend CABG  . CARDIOVASCULAR STRESS TEST  11/2010   normal perfusion, no evidence of  ischemia, EF 62% post exercise  . CARDIOVASCULAR STRESS TEST  11/28/2012   Mild diaphragmatic attenuation; cannot exclude a focal region of nontransmural inferior scar  . CARDIOVERSION N/A 06/25/2015   Procedure: CARDIOVERSION;  Surgeon: Pixie Casino, MD;  Location: [facility] ENDOSCOPY;  Service: Cardiovascular;  Laterality: N/A;  . CARDIOVERSION N/A 11/02/2016   Procedure: CARDIOVERSION;  Surgeon: [name], MD;  Location: [place];  Service: Cardiovascular;  Laterality: N/A;  . CARDIOVERSION N/A 03/20/2019   Procedure: CARDIOVERSION;  Surgeon: Jerline Pain, MD;  [facility];  Service: Cardiovascular;  Laterality: N/A;  . CAROTID ENDARTERECTOMY Left 1999   ([place])  . CATARACT EXTRACTION W/ INTRAOCULAR LENS  IMPLANT, BILATERAL Bilateral 01/2013   [place]  .  COLONOSCOPY  2003   colon polyp x3 - adenomatous Tiffany Kocher)  . COLONOSCOPY  10/08/2012   2 TA, diverticulosis, int hem, no rpt rec Tiffany Kocher)  . CORONARY ANGIOPLASTY    . CORONARY ARTERY BYPASS GRAFT  03/07/2003   4v CABG ([place]) with LIMA to LAD, vein graft to RCA, 1st obtuse marginal, and ramus intermedius  . ESOPHAGOGASTRODUODENOSCOPY  10/08/2012   nl esophagus, duodenitis and erosive gastropathy, path - gastropathy no Hpylori, no rpt rec  . LUMBAR EPIDURAL INJECTION  03/2017   L5/S1 ([place])  . PACEMAKER IMPLANT N/A 04/21/2018   Procedure: PACEMAKER IMPLANT -- Dual Chamber;  Surgeon: Deboraha Sprang, MD;  Location: [place] [facility] LAB;  Service: Cardiovascular;  Laterality: N/A;  . PERIPHERAL VASCULAR CATHETERIZATION N/A 02/02/2016   Procedure: Renal Angiography;  Surgeon: [name], MD;  Location: [place] [facility];  Service: Cardiovascular;  Laterality: N/A;  . PERIPHERAL VASCULAR CATHETERIZATION N/A 02/02/2016   Procedure: Abdominal Aortogram;  Surgeon: [name], MD;  Location: [place] [facility];  Service: Cardiovascular;  Laterality: N/A;  . RENAL DOPPLER  11/29/2011   Celiac&SMA-demonstrated vessel narrowing suggestive of a greater than 50% diameter reduction. Bilateral renal arteries-demonstrated vessel narrowing of 60-99% diameter reduction. Rt Kidney-mid pole lateral simple cyst noted measuring 1.29x0.76x1.11cm and exophytic cyst outside lower pole measuring 1.23x0.96x1.31. Lft Kidney-lateral mid to lower pole simple cyst measuering-1.24x9.83x1.24  . RIGHT/LEFT HEART CATH AND CORONARY/GRAFT ANGIOGRAPHY N/A 10/20/2017   Procedure: RIGHT/LEFT HEART CATH AND CORONARY/GRAFT ANGIOGRAPHY;  Surgeon: Burnell Blanks, MD;  Location: [place] [facility];  Service: Cardiovascular;  Laterality: N/A;  . TEE WITHOUT CARDIOVERSION N/A 06/25/2015   Procedure: TRANSESOPHAGEAL ECHOCARDIOGRAM (TEE);  Surgeon: Pixie Casino, MD;  [facility];  Service: Cardiovascular;  Laterality:  N/A;  . TEE WITHOUT CARDIOVERSION N/A 11/02/2016   Procedure: TRANSESOPHAGEAL ECHOCARDIOGRAM (TEE);  Surgeon: [name], MD;  Location: [place];  Service: Cardiovascular;  Laterality: N/A;  . TEE WITHOUT CARDIOVERSION N/A 12/05/2018   Procedure: TRANSESOPHAGEAL ECHOCARDIOGRAM (TEE);  Surgeon: Sherren Mocha, MD;  Location: [place];  Service: Open Heart Surgery;  Laterality: N/A;  . TONSILLECTOMY    . TRANSCATHETER AORTIC VALVE REPLACEMENT, TRANSFEMORAL N/A 12/05/2018   Procedure: TRANSCATHETER AORTIC VALVE REPLACEMENT, TRANSFEMORAL;  Surgeon: Sherren Mocha, MD;  Location: [place];  Service: Open Heart Surgery;  Laterality: N/A;  . UPPER GASTROINTESTINAL ENDOSCOPY  2003   reflux esophagitis, erosive gastropathy, duodenal ulcer    Current Medications: Current Meds  Medication Sig  . acetaminophen (TYLENOL) 500 MG tablet Take 1 tablet (500 mg total) by mouth 3 (three) times daily. (Patient taking differently: Take 1,000 mg by mouth every 8 (eight) hours as needed for mild pain or headache. )  . amiodarone (PACERONE) 200 MG tablet  Take 1 tablet (200 mg total) by mouth daily.  [name] apixaban (ELIQUIS) 2.5 MG TABS tablet Take 1 tablet (2.5 mg total) by mouth 2 (two) times daily.  . Ascorbic Acid (VITAMIN C PO) Take 1 tablet by mouth daily.  [name] atorvastatin (LIPITOR) 20 MG tablet Take 1 tablet (20 mg total) by mouth daily.  . BD INSULIN SYRINGE U/F 30G X 1/2" 0.5 ML MISC AS DIRECTED.  [name] budesonide-formoterol (SYMBICORT) 160-4.5 MCG/ACT inhaler Inhale 2 puffs into the lungs 2 (two) times daily.  . citalopram (CELEXA) 20 MG tablet Take 1 tablet (20 mg total) by mouth daily.  . feeding supplement, GLUCERNA SHAKE, (GLUCERNA SHAKE) LIQD Take 237 mLs by mouth 3 (three) times daily between meals.  . finasteride (PROSCAR) 5 MG tablet Take 1 tablet (5 mg total) by mouth daily.  [name] glucose blood (ACCU-CHEK AVIVA PLUS) test strip Use as instructed to check blood sugar three times a day. Dx code E11.8 & E11.65   . guaiFENesin (MUCINEX) 600 MG 12 hr tablet Take 600 mg by mouth 2 (two) times daily as needed for cough or to loosen phlegm.  . insulin glargine (LANTUS) 100 UNIT/ML injection Lantus 22 units in the morning and 20 units at bedtime.  [name] ipratropium-albuterol (DUONEB) 0.5-2.5 (3) MG/3ML SOLN Take 3 mLs by nebulization 2 (two) times daily.  [name] lisinopril (ZESTRIL) 10 MG tablet Take 1 tablet (10 mg total) by mouth daily.  . nitroGLYCERIN (NITROSTAT) 0.4 MG SL tablet Place 1 tablet (0.4 mg total) under the tongue every 5 (five) minutes as needed for chest pain.  . pantoprazole (PROTONIX) 40 MG tablet Take 1 tablet (40 mg total) by mouth daily. Take 1 tablet (20 mg total) by mouth daily.  . potassium chloride SA (K-DUR) 20 MEQ tablet Take 1 tablet (20 mEq total) every Monday, Wednesday, Friday, Saturday, and Sunday. (Patient taking differently: Take 20 mEq by mouth See admin instructions. Take 1 tablet (20 mEq total) every Monday, Wednesday, Friday, Saturday, and Sunday.)  . torsemide (DEMADEX) 20 MG tablet Take 3 tablets (60 mg total) by mouth daily.  . traZODone (DESYREL) 50 MG tablet Take 1 tablet (50 mg total) by mouth at bedtime.     Allergies:   Gabapentin, Metoprolol, Spironolactone, Amlodipine, Other, Rosiglitazone maleate, and Tricor [fenofibrate]   Social History   Socioeconomic History  . Marital status: Widowed    Spouse name: Not on file  . Number of children: Not on file  . Years of education: Not on file  . Highest education level: Not on file  Occupational History  . Not on file  Tobacco Use  . Smoking status: Former Smoker    Packs/day: 0.50    Years: 36.00    Pack years: 18.00    Types: Cigarettes    Quit date: 07/23/2013    Years since quitting: 6.2  . Smokeless tobacco: Never Used  Substance and Sexual Activity  . Alcohol use: No  . Drug use: No  . Sexual activity: Not Currently  Other Topics Concern  . Not on file  Social History Narrative   Caffeine: 4 cups  coffee/day, some tea and soda   Lives with wife   Occupation: Retired   Activity: no regular exercise   Diet: good water, fruits/vegetables daily   Social Determinants of Health   Financial Resource Strain: Low Risk   . Difficulty of Paying Living Expenses: Not hard at all  Food Insecurity: No Food Insecurity  . Worried About Charity fundraiser  in the Last Year: Never true  . Ran Out of Food in the Last Year: Never true  Transportation Needs: No Transportation Needs  . Lack of Transportation (Medical): No  . Lack of Transportation (Non-Medical): No  Physical Activity:   . Days of Exercise per Week: Not on file  . Minutes of Exercise per Session: Not on file  Stress:   . Feeling of Stress : Not on file  Social Connections:   . Frequency of Communication with Friends and Family: Not on file  . Frequency of Social Gatherings with Friends and Family: Not on file  . Attends Religious Services: Not on file  . Active Member of Clubs or Organizations: Not on file  . Attends Archivist Meetings: Not on file  . Marital Status: Not on file     Family History: The patient's family history includes Cancer in his brother, brother, maternal grandmother, and mother; Diabetes in his sister; Heart attack in his maternal grandfather, paternal grandfather, and paternal grandmother; Heart disease in his father and sister; Hypertension in his father and sister; Lung disease in his brother; Stroke in his sister.  ROS:   Please see the history of present illness.     All other systems reviewed and are negative.  EKGs/Labs/Other Studies Reviewed:    The following studies were reviewed today: Echo nov 2020  EKG:  EKG is not ordered today.  The ekg ordered 09/07/2019 today demonstrates paced rhythm  Recent Labs: 01/26/2019: NT-Pro BNP 1,162 03/19/2019: TSH 1.707 03/20/2019: Magnesium 1.7 09/06/2019: B Natriuretic Peptide 413.4 09/11/2019: BUN 74 09/21/2019: ALT 56; Creatinine 1.5;  Hemoglobin 10.5; Platelets 130; Potassium 4.2; Sodium 137  Recent Lipid Panel    Component Value Date/Time   CHOL 123 09/21/2019 0000   TRIG 87 09/21/2019 0000   TRIG 133 09/10/2011 0000   HDL 34 (A) 09/21/2019 0000   CHOLHDL 5 02/23/2019 1104   VLDL 56.6 (H) 02/23/2019 1104   LDLCALC 72 09/21/2019 0000   LDLDIRECT 75.0 02/23/2019 1104    Physical Exam:    VS:  BP 110/68 (BP Location: Left Arm, Patient Position: Sitting, Cuff Size: Normal)   Pulse 89   Temp (!) 97.3 F (36.3 C)   Ht 6\' 1"  (1.854 m)   Wt 219 lb (99.3 kg)   BMI 28.89 kg/m     Wt Readings from Last 3 Encounters:  10/08/19 219 lb (99.3 kg)  10/02/19 227 lb (103 kg)  09/12/19 203 lb 14.8 oz (92.5 kg)     GEN: Chronically ill appearing male who presents in a wheel chair on O2 in no acute distress HEENT: Normal NECK: No JVD; No carotid bruits CARDIAC: RRR, no murmurs, rubs, gallops RESPIRATORY:  Faint basilar crackles, no wheezing ABDOMEN: Soft, non-tender, non-distended MUSCULOSKELETAL:  1+ bilateral LE edema, No deformity  SKIN: Warm and dry NEUROLOGIC:  Alert and oriented x 3 PSYCHIATRIC:  Normal affect   ASSESSMENT:    Acute on chronic diastolic (congestive) heart failure (HCC) Apparently a recurring issue.  He was not felt to be in CHF during his recent admission.  Check BMP and BNP today- if his renal function is stable and BNP elevated will increase his diuretics for a few days.  He does have an appointment to be established with the CHF clinic 10/24/2018 and will keep that.   S/P TAVR (transcatheter aortic valve replacement) S/P TAVR March 2020- echo Nov 2020  Pneumonia due to COVID-19 virus Hospitalization 09/2019  Coronary artery disease involving native  coronary artery of native heart without angina pectoris H/O CABG- cath 10/23/2018- 4/4 patent grafts  S/P placement of cardiac pacemaker  04/21/18 St Jude  CKD stage 3 due to type 2 diabetes mellitus (HCC) SCr baseline around 1.5  Chronic  respiratory failure (HCC) On oxygen  Paroxysmal atrial flutter (HCC) On Amiodarone and Eliquis  PLAN:    Check STAT BMP, BNP, and CBC- adjust diuretic based on labs.   Medication Adjustments/Labs and Tests Ordered: Current medicines are reviewed at length with the patient today.  Concerns regarding medicines are outlined above.  Orders Placed This Encounter  Procedures  . Basic Metabolic Panel (BMET)  . Pro b natriuretic peptide (BNP)[facility])  . CBC with Differential   No orders of the defined types were placed in this encounter.   Patient Instructions  Medication Instructions:  Your physician recommends that you continue on your current medications as directed. Please refer to the Current Medication list given to you today. *If you need a refill on your cardiac medications before your next appointment, please call your pharmacy*  Lab Work: Your physician recommends that you return for lab work in: [employer], BMET, BNP If you have labs (blood work) drawn today and your tests are completely normal, you will receive your results only by: [name] MyChart Message (if you have MyChart) OR . A paper copy in the mail If you have any lab test that is abnormal or we need to change your treatment, we will call you to review the results.  Testing/Procedures: NONE   Follow-Up: At [facility], you and your health needs are our priority.  As part of our continuing mission to provide you with exceptional heart care, we have created designated Provider Care Teams.  These Care Teams include your primary Cardiologist (physician) and Advanced Practice Providers (APPs -  Physician Assistants and Nurse Practitioners) who all work together to provide you with the care you need, when you need it.  Your next appointment:   FOLLOW AS SCHEDULED  The format for your next appointment:   In Person  Provider:   [place]  Other Instructions      Signed, Kerin Ransom, Hershal Coria  10/08/2019 11:55 AM    [place]

## 2019-10-08 NOTE — Assessment & Plan Note (Signed)
Apparently a recurring issue.  He was not felt to be in CHF during his recent admission.  Check BMP and BNP today- if his renal function is stable and BNP elevated will increase his diuretics for a few days.  He does have an appointment to be established with the CHF clinic 10/24/2018 and will keep that.

## 2019-10-08 NOTE — Assessment & Plan Note (Signed)
S/P TAVR March 2020- echo Nov 2020

## 2019-10-08 NOTE — Assessment & Plan Note (Signed)
On oxygen 

## 2019-10-08 NOTE — Assessment & Plan Note (Signed)
SCr baseline around 1.5

## 2019-10-08 NOTE — Assessment & Plan Note (Signed)
On Amiodarone and Eliquis

## 2019-10-08 NOTE — Assessment & Plan Note (Signed)
H/O CABG- cath 10/23/2018- 4/4 patent grafts

## 2019-10-08 NOTE — Assessment & Plan Note (Signed)
04/21/18 St Jude

## 2019-10-09 DIAGNOSIS — J441 Chronic obstructive pulmonary disease with (acute) exacerbation: Secondary | ICD-10-CM | POA: Diagnosis not present

## 2019-10-09 DIAGNOSIS — J9621 Acute and chronic respiratory failure with hypoxia: Secondary | ICD-10-CM | POA: Diagnosis not present

## 2019-10-09 DIAGNOSIS — J440 Chronic obstructive pulmonary disease with acute lower respiratory infection: Secondary | ICD-10-CM | POA: Diagnosis not present

## 2019-10-09 LAB — CBC WITH DIFFERENTIAL/PLATELET
Basophils Absolute: 0.1 10*3/uL (ref 0.0–0.2)
Basos: 1 %
EOS (ABSOLUTE): 0.1 10*3/uL (ref 0.0–0.4)
Eos: 1 %
Hematocrit: 35.6 % — ABNORMAL LOW (ref 37.5–51.0)
Hemoglobin: 11.2 g/dL — ABNORMAL LOW (ref 13.0–17.7)
Immature Grans (Abs): 0.1 10*3/uL (ref 0.0–0.1)
Immature Granulocytes: 2 %
Lymphocytes Absolute: 0.9 10*3/uL (ref 0.7–3.1)
Lymphs: 15 %
MCH: 29.3 pg (ref 26.6–33.0)
MCHC: 31.5 g/dL (ref 31.5–35.7)
MCV: 93 fL (ref 79–97)
Monocytes Absolute: 0.6 10*3/uL (ref 0.1–0.9)
Monocytes: 10 %
Neutrophils Absolute: 4.3 10*3/uL (ref 1.4–7.0)
Neutrophils: 71 %
Platelets: 270 10*3/uL (ref 150–450)
RBC: 3.82 x10E6/uL — ABNORMAL LOW (ref 4.14–5.80)
RDW: 14.4 % (ref 11.6–15.4)
WBC: 6 10*3/uL (ref 3.4–10.8)

## 2019-10-09 LAB — BASIC METABOLIC PANEL
BUN/Creatinine Ratio: 16 (ref 10–24)
BUN: 24 mg/dL (ref 8–27)
CO2: 27 mmol/L (ref 20–29)
Calcium: 9.5 mg/dL (ref 8.6–10.2)
Chloride: 103 mmol/L (ref 96–106)
Creatinine, Ser: 1.47 mg/dL — ABNORMAL HIGH (ref 0.76–1.27)
GFR calc Af Amer: 50 mL/min/{1.73_m2} — ABNORMAL LOW (ref >59)
GFR calc non Af Amer: 43 mL/min/{1.73_m2} — ABNORMAL LOW (ref >59)
Glucose: 123 mg/dL — ABNORMAL HIGH (ref 65–99)
Potassium: 5.2 mmol/L (ref 3.5–5.2)
Sodium: 146 mmol/L — ABNORMAL HIGH (ref 134–144)

## 2019-10-09 LAB — PRO B NATRIURETIC PEPTIDE: NT-Pro BNP: 2349 pg/mL — ABNORMAL HIGH (ref 0–486)

## 2019-10-10 DIAGNOSIS — H9190 Unspecified hearing loss, unspecified ear: Secondary | ICD-10-CM

## 2019-10-10 DIAGNOSIS — Z87891 Personal history of nicotine dependence: Secondary | ICD-10-CM

## 2019-10-10 DIAGNOSIS — M47818 Spondylosis without myelopathy or radiculopathy, sacral and sacrococcygeal region: Secondary | ICD-10-CM

## 2019-10-10 DIAGNOSIS — I701 Atherosclerosis of renal artery: Secondary | ICD-10-CM

## 2019-10-10 DIAGNOSIS — Z86018 Personal history of other benign neoplasm: Secondary | ICD-10-CM

## 2019-10-10 DIAGNOSIS — I130 Hypertensive heart and chronic kidney disease with heart failure and stage 1 through stage 4 chronic kidney disease, or unspecified chronic kidney disease: Secondary | ICD-10-CM

## 2019-10-10 DIAGNOSIS — K219 Gastro-esophageal reflux disease without esophagitis: Secondary | ICD-10-CM

## 2019-10-10 DIAGNOSIS — Z79899 Other long term (current) drug therapy: Secondary | ICD-10-CM

## 2019-10-10 DIAGNOSIS — J441 Chronic obstructive pulmonary disease with (acute) exacerbation: Secondary | ICD-10-CM | POA: Diagnosis not present

## 2019-10-10 DIAGNOSIS — Z951 Presence of aortocoronary bypass graft: Secondary | ICD-10-CM

## 2019-10-10 DIAGNOSIS — I441 Atrioventricular block, second degree: Secondary | ICD-10-CM

## 2019-10-10 DIAGNOSIS — Z7982 Long term (current) use of aspirin: Secondary | ICD-10-CM

## 2019-10-10 DIAGNOSIS — N401 Enlarged prostate with lower urinary tract symptoms: Secondary | ICD-10-CM

## 2019-10-10 DIAGNOSIS — D72829 Elevated white blood cell count, unspecified: Secondary | ICD-10-CM

## 2019-10-10 DIAGNOSIS — U071 COVID-19: Secondary | ICD-10-CM | POA: Diagnosis not present

## 2019-10-10 DIAGNOSIS — L409 Psoriasis, unspecified: Secondary | ICD-10-CM

## 2019-10-10 DIAGNOSIS — N138 Other obstructive and reflux uropathy: Secondary | ICD-10-CM

## 2019-10-10 DIAGNOSIS — E1122 Type 2 diabetes mellitus with diabetic chronic kidney disease: Secondary | ICD-10-CM

## 2019-10-10 DIAGNOSIS — Z7901 Long term (current) use of anticoagulants: Secondary | ICD-10-CM

## 2019-10-10 DIAGNOSIS — H348332 Tributary (branch) retinal vein occlusion, bilateral, stable: Secondary | ICD-10-CM

## 2019-10-10 DIAGNOSIS — J440 Chronic obstructive pulmonary disease with acute lower respiratory infection: Secondary | ICD-10-CM | POA: Diagnosis not present

## 2019-10-10 DIAGNOSIS — Z9181 History of falling: Secondary | ICD-10-CM

## 2019-10-10 DIAGNOSIS — N3941 Urge incontinence: Secondary | ICD-10-CM

## 2019-10-10 DIAGNOSIS — M47817 Spondylosis without myelopathy or radiculopathy, lumbosacral region: Secondary | ICD-10-CM

## 2019-10-10 DIAGNOSIS — E1165 Type 2 diabetes mellitus with hyperglycemia: Secondary | ICD-10-CM

## 2019-10-10 DIAGNOSIS — J9811 Atelectasis: Secondary | ICD-10-CM

## 2019-10-10 DIAGNOSIS — J1289 Other viral pneumonia: Secondary | ICD-10-CM | POA: Diagnosis not present

## 2019-10-10 DIAGNOSIS — F329 Major depressive disorder, single episode, unspecified: Secondary | ICD-10-CM

## 2019-10-10 DIAGNOSIS — I2510 Atherosclerotic heart disease of native coronary artery without angina pectoris: Secondary | ICD-10-CM

## 2019-10-10 DIAGNOSIS — Z95820 Peripheral vascular angioplasty status with implants and grafts: Secondary | ICD-10-CM

## 2019-10-10 DIAGNOSIS — E669 Obesity, unspecified: Secondary | ICD-10-CM

## 2019-10-10 DIAGNOSIS — I4892 Unspecified atrial flutter: Secondary | ICD-10-CM

## 2019-10-10 DIAGNOSIS — I480 Paroxysmal atrial fibrillation: Secondary | ICD-10-CM

## 2019-10-10 DIAGNOSIS — Z9089 Acquired absence of other organs: Secondary | ICD-10-CM

## 2019-10-10 DIAGNOSIS — Z952 Presence of prosthetic heart valve: Secondary | ICD-10-CM

## 2019-10-10 DIAGNOSIS — I5043 Acute on chronic combined systolic (congestive) and diastolic (congestive) heart failure: Secondary | ICD-10-CM

## 2019-10-10 DIAGNOSIS — M47816 Spondylosis without myelopathy or radiculopathy, lumbar region: Secondary | ICD-10-CM

## 2019-10-10 DIAGNOSIS — E785 Hyperlipidemia, unspecified: Secondary | ICD-10-CM

## 2019-10-10 DIAGNOSIS — N1832 Chronic kidney disease, stage 3b: Secondary | ICD-10-CM

## 2019-10-10 DIAGNOSIS — Z87311 Personal history of (healed) other pathological fracture: Secondary | ICD-10-CM

## 2019-10-10 DIAGNOSIS — Z9981 Dependence on supplemental oxygen: Secondary | ICD-10-CM

## 2019-10-10 DIAGNOSIS — Z794 Long term (current) use of insulin: Secondary | ICD-10-CM

## 2019-10-10 DIAGNOSIS — E1142 Type 2 diabetes mellitus with diabetic polyneuropathy: Secondary | ICD-10-CM

## 2019-10-10 DIAGNOSIS — J9621 Acute and chronic respiratory failure with hypoxia: Secondary | ICD-10-CM

## 2019-10-10 DIAGNOSIS — Z950 Presence of cardiac pacemaker: Secondary | ICD-10-CM

## 2019-10-11 DIAGNOSIS — J9621 Acute and chronic respiratory failure with hypoxia: Secondary | ICD-10-CM | POA: Diagnosis not present

## 2019-10-11 DIAGNOSIS — J440 Chronic obstructive pulmonary disease with acute lower respiratory infection: Secondary | ICD-10-CM | POA: Diagnosis not present

## 2019-10-11 DIAGNOSIS — J441 Chronic obstructive pulmonary disease with (acute) exacerbation: Secondary | ICD-10-CM | POA: Diagnosis not present

## 2019-10-15 DIAGNOSIS — J440 Chronic obstructive pulmonary disease with acute lower respiratory infection: Secondary | ICD-10-CM | POA: Diagnosis not present

## 2019-10-15 DIAGNOSIS — J441 Chronic obstructive pulmonary disease with (acute) exacerbation: Secondary | ICD-10-CM | POA: Diagnosis not present

## 2019-10-15 DIAGNOSIS — J9621 Acute and chronic respiratory failure with hypoxia: Secondary | ICD-10-CM | POA: Diagnosis not present

## 2019-10-16 ENCOUNTER — Telehealth: Payer: Self-pay | Admitting: *Deleted

## 2019-10-16 DIAGNOSIS — J440 Chronic obstructive pulmonary disease with acute lower respiratory infection: Secondary | ICD-10-CM | POA: Diagnosis not present

## 2019-10-16 DIAGNOSIS — J9621 Acute and chronic respiratory failure with hypoxia: Secondary | ICD-10-CM | POA: Diagnosis not present

## 2019-10-16 DIAGNOSIS — J441 Chronic obstructive pulmonary disease with (acute) exacerbation: Secondary | ICD-10-CM | POA: Diagnosis not present

## 2019-10-16 NOTE — Telephone Encounter (Signed)
Skeet Simmer PT with [facility] left a voicemail stating that he was out today to see the patient and his weight is up 5 lbs since last week. Skeet Simmer stated that he saw the patient 10/11/19 and his weight was 217 and today it is 222.5. Skeet Simmer stated that patient complained of no energy, weakness in legs and difficulty walking. Skeet Simmer stated that he is going to do some light exercises today. Skeet Simmer stated that you can contact the patient's daughter about the weight gain or call him back.

## 2019-10-17 DIAGNOSIS — J441 Chronic obstructive pulmonary disease with (acute) exacerbation: Secondary | ICD-10-CM | POA: Diagnosis not present

## 2019-10-17 DIAGNOSIS — J9621 Acute and chronic respiratory failure with hypoxia: Secondary | ICD-10-CM | POA: Diagnosis not present

## 2019-10-17 DIAGNOSIS — J440 Chronic obstructive pulmonary disease with acute lower respiratory infection: Secondary | ICD-10-CM | POA: Diagnosis not present

## 2019-10-17 NOTE — Telephone Encounter (Signed)
Recent BNP was high suggesting CHF exacerbation leading to weight gain, weakness, fatigue.  Let's increase torsemide to 60mg  twice daily. Update [place] with weight effect over next 2-3 days.  Also need to ensure he is limiting salt in diet - as this directly contributes to weight gain and heart function.

## 2019-10-17 NOTE — Telephone Encounter (Signed)
What dose torsemide is he currently taking? I believe cardiology had increased him to 60mg  in am and 40mg  in afternoon - is he still taking this dose?

## 2019-10-17 NOTE — Telephone Encounter (Addendum)
Spoke with pt's daughter, [name] (on dpr), asking about torsemide dose.  Confirms pt takes 60 mg in AM, 40 mg in PM per increase by cards.  States pt has appt on 1/21//20 with heart failure clinic.

## 2019-10-17 NOTE — Telephone Encounter (Addendum)
Spoke with [name], relaying Dr. Synthia Innocent message.  Verbalizes understanding.  States she has discussed salt intake with pt several times.  Says pt understands.

## 2019-10-18 DIAGNOSIS — J440 Chronic obstructive pulmonary disease with acute lower respiratory infection: Secondary | ICD-10-CM | POA: Diagnosis not present

## 2019-10-18 DIAGNOSIS — J9621 Acute and chronic respiratory failure with hypoxia: Secondary | ICD-10-CM | POA: Diagnosis not present

## 2019-10-18 DIAGNOSIS — J441 Chronic obstructive pulmonary disease with (acute) exacerbation: Secondary | ICD-10-CM | POA: Diagnosis not present

## 2019-10-23 ENCOUNTER — Telehealth: Payer: Self-pay

## 2019-10-23 DIAGNOSIS — J441 Chronic obstructive pulmonary disease with (acute) exacerbation: Secondary | ICD-10-CM | POA: Diagnosis not present

## 2019-10-23 DIAGNOSIS — J440 Chronic obstructive pulmonary disease with acute lower respiratory infection: Secondary | ICD-10-CM | POA: Diagnosis not present

## 2019-10-23 DIAGNOSIS — J9621 Acute and chronic respiratory failure with hypoxia: Secondary | ICD-10-CM | POA: Diagnosis not present

## 2019-10-23 MED ORDER — TORSEMIDE 20 MG PO TABS: 60.0000 mg | ORAL_TABLET | Freq: Three times a day (TID) | ORAL | Status: AC

## 2019-10-23 NOTE — Telephone Encounter (Signed)
Lamuel PT with [facility] left v/m; pt complains of feeling weak,no energy and these are not new complaints; pt has had same complaint since d/c from hospital for covid.  Pt has gained 9.5 lbs in 1 wk. Today pt weighed 232.5 lbs. Pt feet and abdominal area are swollen. Does not have desaturization . Pt is breathing about the same with no other symptoms.Please advise. Dr Darnell Level out of office until end of wk and sending to Dr Damita Dunnings.

## 2019-10-23 NOTE — Telephone Encounter (Signed)
Dr. Damita Dunnings,  I spoke with daughter [name] (on [place]) and she states pt is taking the torsemide 60mg . She states last week Dr. Darnell Level changed it to where pt takes 60mg  in the daytime then 60mg  at night so this is how he has been taking it. I checked chart and there is a phone note dated 10/16/2019 where this is addressed.

## 2019-10-23 NOTE — Telephone Encounter (Signed)
He has f/u with the CHF clinic on 10/25/19.  Have him keep that.  Please verify that he is taking torsemide 60mg  a day (3x20mg  tabs).  If so, then take an extra 60mg  now.   Continue with 60mg  TWICE a day tomorrow and then take 60mg  in the AM on 10/25/19.  CHF clinic will likely adjust his dose at that point.    I routed this to Dr. Haroldine Laws in the meantime as FYI.  Thanks.

## 2019-10-23 NOTE — Telephone Encounter (Signed)
Called Charles [name].  I would change to 60mg  3 times a day in the meantime until CHF clinic appointment.  She understood.  Med list updated.  Routed to cards and PCP as FYI.

## 2019-10-24 NOTE — Telephone Encounter (Signed)
Noted thank you

## 2019-10-24 NOTE — Telephone Encounter (Signed)
Got it. Thanks for heads up.

## 2019-10-25 ENCOUNTER — Encounter (HOSPITAL_COMMUNITY): Payer: Self-pay | Admitting: Internal Medicine

## 2019-10-25 ENCOUNTER — Ambulatory Visit (HOSPITAL_COMMUNITY)
Admission: RE | Admit: 2019-10-25 | Discharge: 2019-10-25 | Disposition: A | Discharge status: Home / Self Care | Payer: Medicare Other | Source: Ambulatory Visit | Attending: Internal Medicine | Admitting: Internal Medicine

## 2019-10-25 ENCOUNTER — Other Ambulatory Visit: Payer: Self-pay

## 2019-10-25 VITALS — BP 100/45 | HR 88 | Wt 234.6 lb

## 2019-10-25 DIAGNOSIS — J441 Chronic obstructive pulmonary disease with (acute) exacerbation: Secondary | ICD-10-CM | POA: Diagnosis not present

## 2019-10-25 DIAGNOSIS — J9610 Chronic respiratory failure, unspecified whether with hypoxia or hypercapnia: Secondary | ICD-10-CM | POA: Insufficient documentation

## 2019-10-25 DIAGNOSIS — N183 Chronic kidney disease, stage 3 unspecified: Secondary | ICD-10-CM | POA: Diagnosis not present

## 2019-10-25 DIAGNOSIS — Z87891 Personal history of nicotine dependence: Secondary | ICD-10-CM | POA: Diagnosis not present

## 2019-10-25 DIAGNOSIS — E785 Hyperlipidemia, unspecified: Secondary | ICD-10-CM | POA: Diagnosis not present

## 2019-10-25 DIAGNOSIS — E1122 Type 2 diabetes mellitus with diabetic chronic kidney disease: Secondary | ICD-10-CM | POA: Insufficient documentation

## 2019-10-25 DIAGNOSIS — I350 Nonrheumatic aortic (valve) stenosis: Secondary | ICD-10-CM | POA: Diagnosis not present

## 2019-10-25 DIAGNOSIS — Z7901 Long term (current) use of anticoagulants: Secondary | ICD-10-CM | POA: Diagnosis not present

## 2019-10-25 DIAGNOSIS — I5032 Chronic diastolic (congestive) heart failure: Secondary | ICD-10-CM | POA: Insufficient documentation

## 2019-10-25 DIAGNOSIS — Z888 Allergy status to other drugs, medicaments and biological substances status: Secondary | ICD-10-CM | POA: Insufficient documentation

## 2019-10-25 DIAGNOSIS — I4892 Unspecified atrial flutter: Secondary | ICD-10-CM | POA: Diagnosis not present

## 2019-10-25 DIAGNOSIS — I2510 Atherosclerotic heart disease of native coronary artery without angina pectoris: Secondary | ICD-10-CM | POA: Insufficient documentation

## 2019-10-25 DIAGNOSIS — Z950 Presence of cardiac pacemaker: Secondary | ICD-10-CM | POA: Diagnosis not present

## 2019-10-25 DIAGNOSIS — Z952 Presence of prosthetic heart valve: Secondary | ICD-10-CM | POA: Insufficient documentation

## 2019-10-25 DIAGNOSIS — Z79899 Other long term (current) drug therapy: Secondary | ICD-10-CM | POA: Insufficient documentation

## 2019-10-25 DIAGNOSIS — Z7951 Long term (current) use of inhaled steroids: Secondary | ICD-10-CM | POA: Insufficient documentation

## 2019-10-25 DIAGNOSIS — I130 Hypertensive heart and chronic kidney disease with heart failure and stage 1 through stage 4 chronic kidney disease, or unspecified chronic kidney disease: Secondary | ICD-10-CM | POA: Insufficient documentation

## 2019-10-25 DIAGNOSIS — I480 Paroxysmal atrial fibrillation: Secondary | ICD-10-CM | POA: Diagnosis not present

## 2019-10-25 DIAGNOSIS — Z794 Long term (current) use of insulin: Secondary | ICD-10-CM | POA: Diagnosis not present

## 2019-10-25 DIAGNOSIS — J449 Chronic obstructive pulmonary disease, unspecified: Secondary | ICD-10-CM | POA: Diagnosis not present

## 2019-10-25 DIAGNOSIS — J440 Chronic obstructive pulmonary disease with acute lower respiratory infection: Secondary | ICD-10-CM | POA: Diagnosis not present

## 2019-10-25 DIAGNOSIS — I701 Atherosclerosis of renal artery: Secondary | ICD-10-CM | POA: Insufficient documentation

## 2019-10-25 DIAGNOSIS — Z8249 Family history of ischemic heart disease and other diseases of the circulatory system: Secondary | ICD-10-CM | POA: Diagnosis not present

## 2019-10-25 DIAGNOSIS — J9621 Acute and chronic respiratory failure with hypoxia: Secondary | ICD-10-CM | POA: Diagnosis not present

## 2019-10-25 DIAGNOSIS — Z951 Presence of aortocoronary bypass graft: Secondary | ICD-10-CM | POA: Insufficient documentation

## 2019-10-25 DIAGNOSIS — Z833 Family history of diabetes mellitus: Secondary | ICD-10-CM | POA: Insufficient documentation

## 2019-10-25 DIAGNOSIS — I5033 Acute on chronic diastolic (congestive) heart failure: Secondary | ICD-10-CM | POA: Insufficient documentation

## 2019-10-25 DIAGNOSIS — Z8616 Personal history of COVID-19: Secondary | ICD-10-CM | POA: Insufficient documentation

## 2019-10-25 LAB — BASIC METABOLIC PANEL
Anion gap: 12 (ref 5–15)
BUN: 29 mg/dL — ABNORMAL HIGH (ref 8–23)
CO2: 29 mmol/L (ref 22–32)
Calcium: 8.9 mg/dL (ref 8.9–10.3)
Chloride: 98 mmol/L (ref 98–111)
Creatinine, Ser: 1.83 mg/dL — ABNORMAL HIGH (ref 0.61–1.24)
GFR calc Af Amer: 38 mL/min — ABNORMAL LOW (ref >60)
GFR calc non Af Amer: 33 mL/min — ABNORMAL LOW (ref >60)
Glucose, Bld: 123 mg/dL — ABNORMAL HIGH (ref 70–99)
Potassium: 3.8 mmol/L (ref 3.5–5.1)
Sodium: 139 mmol/L (ref 135–145)

## 2019-10-25 LAB — BRAIN NATRIURETIC PEPTIDE: B Natriuretic Peptide: 356.7 pg/mL — ABNORMAL HIGH (ref 0.0–100.0)

## 2019-10-25 MED ORDER — METOLAZONE 5 MG PO TABS: ORAL_TABLET | ORAL | 1 refills | Status: DC

## 2019-10-25 NOTE — Patient Instructions (Addendum)
Take Metolazone 5mg  (1 tab) daily x2 days.  Take an extra dose of Potassium 20 meq (1 tab) x2 days (with metolazone doses)  CALL OFFICE TOMORROW TO DISCUSS WEIGHT.  IF YOUR WEIGHT IS NOT DOWN 5 LBS TOMORROW, GO TO THE EMERGENCY ROOM.  Labs today We will only contact you if something comes back abnormal or we need to make some changes. Otherwise no news is good news!   Your physician recommends that you schedule a follow-up appointment in: Monday with the Nurse Practitioner.    Please call office at [phone] option [phone] if you have any questions or concerns.    At the [facility], you and your health needs are our priority. As part of our continuing mission to provide you with exceptional heart care, we have created designated Provider Care Teams. These Care Teams include your primary Cardiologist (physician) and Advanced Practice Providers (APPs- Physician Assistants and Nurse Practitioners) who all work together to provide you with the care you need, when you need it.   You may see any of the following providers on your designated Care Team at your next follow up: [name] Dr Glori Bickers . Dr Loralie Champagne . Darrick Grinder, NP . Lyda Jester, PA . Audry Riles, PharmD   Please be sure to bring in all your medications bottles to every appointment.

## 2019-10-25 NOTE — Progress Notes (Addendum)
ADVANCED HF CLINIC CONSULT NOTE  Referring Provider: Arnold Long NP Primary cardiologist: [name]  HPI:   Charles [name] is a 86 y.o. male with a hx of chronic diastolic HF with frequent decompensations and admissions, CAD s/p CABG x 4 (LIMA to LAD, SVG to RCA, OM branch to ramus) in 2004, aortic stenosis s/p TAVR 12/22/2018, PPM July 2019, HTN, HLD, COPD on chronic O2, PAF/ flutter s/p TEE cardioversion in the remote past, DM, and renal artery stenosis with 70% bilateral stenosis referred by Dr. [name] for further management of his HF   His last echo was 08/2019 and showed and EF of 60-65% with mild LVH, grade 2 DD, and pulmonary HTN with PA  40-45 mmHg.   He was admitted 12/03- 09/11/2019 with COVID pneumonia. Discharged to [facility]. Discharge weight ~215  He is here with his daughter in law. He was seen by Kerin Ransom 2 weeks ago with complaints of LE edema and increased SOB at that visit was 218. Now has gained 15 pounds. Very SOB. + orthopnea and PND. Was on torsemide 60 daily. For past 2 days taking 60 bid without much benefit. Drinks a lot of fluid - whole milk and Diet Pepsi. He lives alone but family fixes all his medicines.    Review of Systems: [y] = yes, [ ]  = no   General: Weight gain Blue.Reese ]; Weight loss [ ] ; Anorexia [ ] ; Fatigue Blue.Reese ]; Fever [ ] ; Chills [ ] ; Weakness Blue.Reese ]  Cardiac: Chest pain/pressure [ ] ; Resting SOB Blue.Reese ]; Exertional SOB [ ] ; Orthopnea [ y]; Pedal Edema Blue.Reese ]; Palpitations [ ] ; Syncope [ ] ; Presyncope [ ] ; Paroxysmal nocturnal dyspnea[y ]  Pulmonary: Cough Blue.Reese ]; Wheezing[ ] ; Hemoptysis[ ] ; Sputum [ ] ; Snoring [ ]   GI: Vomiting[ ] ; Dysphagia[ ] ; Melena[ ] ; Hematochezia [ ] ; Heartburn[ ] ; Abdominal pain [ ] ; Constipation [ ] ; Diarrhea [ ] ; BRBPR [ ]   GU: Hematuria[ ] ; Dysuria [ ] ; Nocturia[ ]   Vascular: Pain in legs with walking [ ] ; Pain in feet with lying flat [ ] ; Non-healing sores [ ] ; Stroke [ ] ; TIA [ ] ; Slurred speech [ ] ;  Neuro: Headaches[ ] ; Vertigo[  ]; Seizures[ ] ; Paresthesias[ ] ;Blurred vision [ ] ; Diplopia [ ] ; Vision changes [ ]   Ortho/Skin: Arthritis Blue.Reese ]; Joint pain [ y]; Muscle pain [ ] ; Joint swelling [ ] ; Back Pain [ ] ; Rash [ ]   Psych: Depression[ ] ; Anxiety[ ]   Heme: Bleeding problems [ ] ; Clotting disorders [ ] ; Anemia [ ]   Endocrine: Diabetes Blue.Reese ]; Thyroid dysfunction[ ]    Past Medical History:  Diagnosis Date  . Abdominal aortic atherosclerosis ([place])    by xray  . Arthritis    in lower back (06/24/2015)  . Atrial flutter ([place])    notes 06/24/2015  . AV block, Mobitz 1 09/06/2017   Archie Endo 09/06/2017  . 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 CAD (coronary artery disease) 2004   a. s/p CABG in 2004 with LIMA-LAD, SVG-RCA, SVG-OM, and SVG-RI  . Carotid stenosis 1999   s/p L CEA  . Chronic diastolic CHF (congestive heart failure) ([place])   . Colon polyp 2005   (Dr. Tiffany Kocher)  . Compression fracture of L1 lumbar vertebra (HCC) remote  . COPD (chronic obstructive pulmonary disease) ([place]) 03/2011   by xray  . COVID-19 09/06/2019  . Depression    hx  . GERD (gastroesophageal reflux disease) 2003   h/o  duodenal ulcer per EGD as well as esophagitis  . History of colon polyps 2003, 2005   adenomatous Vira Agar)  . History of tobacco abuse   . HLD (hyperlipidemia)   . HTN (hypertension)   . Pneumonia due to COVID-19 virus 09/07/2019   Hospitalization 09/2019  . Presence of permanent cardiac pacemaker   . Psoriasis   . Renal artery stenosis ([place]) 2004   70% bilateral, followed by cards  . 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 Severe aortic stenosis   . T2DM (type 2 diabetes mellitus) ([place]) 1995  . Urge incontinence of urine     Current Outpatient Medications  Medication Sig Dispense Refill  . acetaminophen (TYLENOL) 500 MG tablet Take 1 tablet (500 mg total) by mouth 3 (three)  times daily.    [name] amiodarone (PACERONE) 200 MG tablet Take 1 tablet (200 mg total) by mouth daily. 30 tablet 1  . apixaban (ELIQUIS) 2.5 MG TABS tablet Take 1 tablet (2.5 mg total) by mouth 2 (two) times daily. 60 tablet 3  . Ascorbic Acid (VITAMIN C PO) Take 1 tablet by mouth daily.    [name] atorvastatin (LIPITOR) 20 MG tablet Take 1 tablet (20 mg total) by mouth daily. 90 tablet 3  . BD INSULIN SYRINGE U/F 30G X 1/2" 0.5 ML MISC AS DIRECTED. 100 each 0  . budesonide-formoterol (SYMBICORT) 160-4.5 MCG/ACT inhaler Inhale 2 puffs into the lungs 2 (two) times daily. 1 Inhaler 2  . citalopram (CELEXA) 20 MG tablet Take 1 tablet (20 mg total) by mouth daily. 90 tablet 2  . finasteride (PROSCAR) 5 MG tablet Take 1 tablet (5 mg total) by mouth daily. 30 tablet 5  . glucose blood (ACCU-CHEK AVIVA PLUS) test strip Use as instructed to check blood sugar three times a day. Dx code E11.8 & E11.65 300 strip 1  . guaiFENesin (MUCINEX) 600 MG 12 hr tablet Take 600 mg by mouth 2 (two) times daily as needed for cough or to loosen phlegm.    . insulin aspart (NOVOLOG) 100 UNIT/ML injection Inject 6 Units into the skin 3 (three) times daily with meals for 6 days.    . insulin glargine (LANTUS) 100 UNIT/ML injection Lantus 22 units in the morning and 20 units at bedtime. 30 mL 0  . lisinopril (ZESTRIL) 10 MG tablet Take 1 tablet (10 mg total) by mouth daily. 30 tablet 6  . nitroGLYCERIN (NITROSTAT) 0.4 MG SL tablet Place 1 tablet (0.4 mg total) under the tongue every 5 (five) minutes as needed for chest pain. 90 tablet 3  . pantoprazole (PROTONIX) 40 MG tablet Take 1 tablet (40 mg total) by mouth daily. Take 1 tablet (20 mg total) by mouth daily. 30 tablet 1  . potassium chloride SA (K-DUR) 20 MEQ tablet Take 1 tablet (20 mEq total) every Monday, Wednesday, Friday, Saturday, and Sunday. 30 tablet 11  . torsemide (DEMADEX) 20 MG tablet Take 3 tablets (60 mg total) by mouth 3 (three) times daily. TEMPORARY INCREASE IN DOSE     . traZODone (DESYREL) 50 MG tablet Take 1 tablet (50 mg total) by mouth at bedtime. 30 tablet 0   No current facility-administered medications for this encounter.    Allergies  Allergen Reactions  . Gabapentin Other (See Comments)    Leg pain, weakness   . Metoprolol Swelling  . Spironolactone Other (See Comments)  Painful gynecomastia  . Amlodipine Other (See Comments)    Edema  . Other Other (See Comments)    Horse serum - myalgias  . Rosiglitazone Maleate Other (See Comments)     Did not help  . Tricor [Fenofibrate] Other (See Comments)    myalgias      Social History   Socioeconomic History  . Marital status: Widowed    Spouse name: Not on file  . Number of children: Not on file  . Years of education: Not on file  . Highest education level: Not on file  Occupational History  . Not on file  Tobacco Use  . Smoking status: Former Smoker    Packs/day: 0.50    Years: 36.00    Pack years: 18.00    Types: Cigarettes    Quit date: 07/23/2013    Years since quitting: 6.2  . Smokeless tobacco: Never Used  Substance and Sexual Activity  . Alcohol use: No  . Drug use: No  . Sexual activity: Not Currently  Other Topics Concern  . Not on file  Social History Narrative   Caffeine: 4 cups coffee/day, some tea and soda   Lives with wife   Occupation: Retired   Activity: no regular exercise   Diet: good water, fruits/vegetables daily   Social Determinants of Health   Financial Resource Strain: Low Risk   . Difficulty of Paying Living Expenses: Not hard at all  Food Insecurity: No Food Insecurity  . Worried About Charity fundraiser in the Last Year: Never true  . Ran Out of Food in the Last Year: Never true  Transportation Needs: No Transportation Needs  . Lack of Transportation (Medical): No  . Lack of Transportation (Non-Medical): No  Physical Activity:   . Days of Exercise per Week: Not on file  . Minutes of Exercise per Session: Not on file  Stress:     . Feeling of Stress : Not on file  Social Connections:   . Frequency of Communication with Friends and Family: Not on file  . Frequency of Social Gatherings with Friends and Family: Not on file  . Attends Religious Services: Not on file  . Active Member of Clubs or Organizations: Not on file  . Attends Archivist Meetings: Not on file  . Marital Status: Not on file  Intimate Partner Violence:   . Fear of Current or Ex-Partner: Not on file  . Emotionally Abused: Not on file  . Physically Abused: Not on file  . Sexually Abused: Not on file      Family History  Problem Relation Age of Onset  . Hypertension Father   . Heart disease Father   . Cancer Mother        GI cancer  . Diabetes Sister   . Heart disease Sister   . Stroke Sister   . Hypertension Sister   . Cancer Brother        Lung cancer - nonsmoker  . Cancer Maternal Grandmother   . Heart attack Maternal Grandfather   . Heart attack Paternal Grandmother   . Heart attack Paternal Grandfather   . Cancer Brother        Pancreatic cancer  . Lung disease Brother     Vitals:   10/25/19 1532  Weight: 106.4 kg (234 lb 9.6 oz)    PHYSICAL EXAM: General:  Elderly weak appearing. HOH. No respiratory difficulty on O2 HEENT: normal Neck: supple. JVP to jaw Carotids 2+ bilat; no bruits.  No lymphadenopathy or thryomegaly appreciated. Cor: PMI nondisplaced. Regular rate & rhythm. Soft SEM at RUSB Lungs: coarse Abdomen: obese soft, nontender, + distended. No hepatosplenomegaly. No bruits or masses. Good bowel sounds. Extremities: no cyanosis, clubbing, rash, 3+ edema Neuro: alert & oriented x 3, cranial nerves grossly intact. moves all 4 extremities w/o difficulty. Affect pleasant.  ECG: Sinus rhythm with RV pacing at 89 Personally reviewed   ASSESSMENT & PLAN:  1. Acute on Chronic diastolic (congestive) heart failure  - EF of 60-65% with mild LVH, grade 2 DD, and pulmonary HTN with PA  40-45 mmHg.  - he is  markedly volume overloaded in setting of marked dietary non-compliance - baseline diuretic dosing is torsemide 60 daily. Now on torsemide 60 bid and not responding - Will add metolazone 5 mg daily + KCl  20 for next 3 days.  - If not responding call office tomorrow and will need admit for IV lasix. - Once fully diuresed will switch back to torsemide 60 daily with metolazone on Monday and Friday - Stressed need for limiting fluid intake  2. Aortic stenosis S/P TAVR (transcatheter aortic valve replacement) - S/P TAVR March 2020  3. Coronary artery disease involving native coronary artery of native heart without angina pectoris - s/p CABG - cath 10/23/2018- 4/4 patent grafts - no s/s ischemia - management per Dr. [name] - Will eventually need SGLT2i   3. H/o heart block S/P placement of cardiac pacemaker  - 04/21/18 St Jude  4. CKD stage 3 due to type 2 diabetes mellitus  - SCr baseline around 1.5. Follow closely with diuresis  5. COPD with Chronic respiratory failure  On oxygen  6. Paroxysmal atrial fib/flutter (HCC) - In NSR On Amiodarone and Eliquis - PPM interrogated personally has had recent flare of AF but now back in NSR.   Total time spent 45 minutes. Over half that time spent discussing above.    Glori Bickers, MD  3:39 PM

## 2019-10-26 ENCOUNTER — Telehealth (HOSPITAL_COMMUNITY): Payer: Self-pay | Admitting: *Deleted

## 2019-10-26 DIAGNOSIS — J441 Chronic obstructive pulmonary disease with (acute) exacerbation: Secondary | ICD-10-CM | POA: Diagnosis not present

## 2019-10-26 DIAGNOSIS — J9621 Acute and chronic respiratory failure with hypoxia: Secondary | ICD-10-CM | POA: Diagnosis not present

## 2019-10-26 DIAGNOSIS — J440 Chronic obstructive pulmonary disease with acute lower respiratory infection: Secondary | ICD-10-CM | POA: Diagnosis not present

## 2019-10-26 NOTE — Telephone Encounter (Signed)
[name] called as instructed by Dr.Bensimhon to report patient lost 5lbs over night after taking metolazone and she needed further instructions. I gave instructions per AVS from 10/25/19   "Take Metolazone 5mg  (1 tab) daily x2 days.  Take an extra dose of Potassium 20 meq (1 tab) x2 days (with metolazone doses)  CALL OFFICE TOMORROW TO DISCUSS WEIGHT.  IF YOUR WEIGHT IS NOT DOWN 5 LBS TOMORROW, GO TO THE EMERGENCY ROOM."  I told her to keep follow up visit on Monday. [name] verbalized understanding.

## 2019-10-27 DIAGNOSIS — I5033 Acute on chronic diastolic (congestive) heart failure: Secondary | ICD-10-CM | POA: Diagnosis not present

## 2019-10-29 ENCOUNTER — Encounter (HOSPITAL_COMMUNITY): Payer: Self-pay

## 2019-10-29 ENCOUNTER — Other Ambulatory Visit: Payer: Self-pay

## 2019-10-29 ENCOUNTER — Ambulatory Visit (HOSPITAL_COMMUNITY)
Admission: RE | Admit: 2019-10-29 | Discharge: 2019-10-29 | Disposition: A | Discharge status: Home / Self Care | Payer: Medicare Other | Source: Ambulatory Visit | Attending: Adult Health | Admitting: Adult Health

## 2019-10-29 VITALS — BP 118/70 | HR 82 | Wt 226.4 lb

## 2019-10-29 DIAGNOSIS — I2510 Atherosclerotic heart disease of native coronary artery without angina pectoris: Secondary | ICD-10-CM | POA: Insufficient documentation

## 2019-10-29 DIAGNOSIS — I272 Pulmonary hypertension, unspecified: Secondary | ICD-10-CM | POA: Insufficient documentation

## 2019-10-29 DIAGNOSIS — F329 Major depressive disorder, single episode, unspecified: Secondary | ICD-10-CM | POA: Insufficient documentation

## 2019-10-29 DIAGNOSIS — I5032 Chronic diastolic (congestive) heart failure: Secondary | ICD-10-CM

## 2019-10-29 DIAGNOSIS — J440 Chronic obstructive pulmonary disease with acute lower respiratory infection: Secondary | ICD-10-CM | POA: Diagnosis not present

## 2019-10-29 DIAGNOSIS — E785 Hyperlipidemia, unspecified: Secondary | ICD-10-CM | POA: Insufficient documentation

## 2019-10-29 DIAGNOSIS — Z951 Presence of aortocoronary bypass graft: Secondary | ICD-10-CM | POA: Insufficient documentation

## 2019-10-29 DIAGNOSIS — R0602 Shortness of breath: Secondary | ICD-10-CM

## 2019-10-29 DIAGNOSIS — K219 Gastro-esophageal reflux disease without esophagitis: Secondary | ICD-10-CM | POA: Insufficient documentation

## 2019-10-29 DIAGNOSIS — N183 Chronic kidney disease, stage 3 unspecified: Secondary | ICD-10-CM | POA: Insufficient documentation

## 2019-10-29 DIAGNOSIS — I130 Hypertensive heart and chronic kidney disease with heart failure and stage 1 through stage 4 chronic kidney disease, or unspecified chronic kidney disease: Secondary | ICD-10-CM | POA: Diagnosis not present

## 2019-10-29 DIAGNOSIS — Z794 Long term (current) use of insulin: Secondary | ICD-10-CM | POA: Insufficient documentation

## 2019-10-29 DIAGNOSIS — Z7901 Long term (current) use of anticoagulants: Secondary | ICD-10-CM | POA: Insufficient documentation

## 2019-10-29 DIAGNOSIS — I480 Paroxysmal atrial fibrillation: Secondary | ICD-10-CM | POA: Insufficient documentation

## 2019-10-29 DIAGNOSIS — Z8249 Family history of ischemic heart disease and other diseases of the circulatory system: Secondary | ICD-10-CM | POA: Diagnosis not present

## 2019-10-29 DIAGNOSIS — E1122 Type 2 diabetes mellitus with diabetic chronic kidney disease: Secondary | ICD-10-CM | POA: Insufficient documentation

## 2019-10-29 DIAGNOSIS — Z952 Presence of prosthetic heart valve: Secondary | ICD-10-CM | POA: Insufficient documentation

## 2019-10-29 DIAGNOSIS — I4892 Unspecified atrial flutter: Secondary | ICD-10-CM | POA: Insufficient documentation

## 2019-10-29 DIAGNOSIS — M1990 Unspecified osteoarthritis, unspecified site: Secondary | ICD-10-CM | POA: Diagnosis not present

## 2019-10-29 DIAGNOSIS — I350 Nonrheumatic aortic (valve) stenosis: Secondary | ICD-10-CM | POA: Diagnosis not present

## 2019-10-29 DIAGNOSIS — Z9981 Dependence on supplemental oxygen: Secondary | ICD-10-CM | POA: Diagnosis not present

## 2019-10-29 DIAGNOSIS — I701 Atherosclerosis of renal artery: Secondary | ICD-10-CM | POA: Diagnosis not present

## 2019-10-29 DIAGNOSIS — Z87891 Personal history of nicotine dependence: Secondary | ICD-10-CM | POA: Diagnosis not present

## 2019-10-29 DIAGNOSIS — J432 Centrilobular emphysema: Secondary | ICD-10-CM | POA: Insufficient documentation

## 2019-10-29 DIAGNOSIS — J9610 Chronic respiratory failure, unspecified whether with hypoxia or hypercapnia: Secondary | ICD-10-CM | POA: Diagnosis not present

## 2019-10-29 DIAGNOSIS — Z79899 Other long term (current) drug therapy: Secondary | ICD-10-CM | POA: Diagnosis not present

## 2019-10-29 LAB — BASIC METABOLIC PANEL
Anion gap: 11 (ref 5–15)
BUN: 33 mg/dL — ABNORMAL HIGH (ref 8–23)
CO2: 33 mmol/L — ABNORMAL HIGH (ref 22–32)
Calcium: 9.3 mg/dL (ref 8.9–10.3)
Chloride: 96 mmol/L — ABNORMAL LOW (ref 98–111)
Creatinine, Ser: 1.76 mg/dL — ABNORMAL HIGH (ref 0.61–1.24)
GFR calc Af Amer: 40 mL/min — ABNORMAL LOW (ref >60)
GFR calc non Af Amer: 34 mL/min — ABNORMAL LOW (ref >60)
Glucose, Bld: 216 mg/dL — ABNORMAL HIGH (ref 70–99)
Potassium: 4 mmol/L (ref 3.5–5.1)
Sodium: 140 mmol/L (ref 135–145)

## 2019-10-29 NOTE — Patient Instructions (Signed)
Lab work done today. We will notify you of any abnormal lab work. No news is good news!  Please follow up with [facility] in 2 weeks.  At the [facility], you and your health needs are our priority. As part of our continuing mission to provide you with exceptional heart care, we have created designated Provider Care Teams. These Care Teams include your primary Cardiologist (physician) and Advanced Practice Providers (APPs- Physician Assistants and Nurse Practitioners) who all work together to provide you with the care you need, when you need it.   You may see any of the following providers on your designated Care Team at your next follow up: [name] Dr Glori Bickers . Dr Loralie Champagne . Darrick Grinder, NP . Lyda Jester, PA . Audry Riles, PharmD   Please be sure to bring in all your medications bottles to every appointment.

## 2019-10-29 NOTE — Progress Notes (Signed)
ADVANCED HF CLINIC   Referring Provider: Arnold Long NP Primary Cardiologist: [name] HF Cardiology: Dr Haroldine Laws  HPI: Charles [name] is a 86 y.o. male with a hx of chronic diastolic HF with frequent decompensations and admissions, CAD s/p CABG x 4 (LIMA to LAD, SVG to RCA, OM branch to ramus) in 2004, aortic stenosis s/p TAVR 12/22/2018, PPM July 2019, HTN, HLD, COPD on chronic O2, PAF/ flutter s/p TEE cardioversion in the remote past, DM, and renal artery stenosis with 70% bilateral stenosis referred by Dr. [name] for further management of his HF   His last echo was 08/2019 and showed and EF of 60-65% with mild LVH, grade 2 DD, and pulmonary HTN with PA  40-45 mmHg.   He was admitted 12/03- 09/11/2019 with COVID pneumonia. Discharged to [facility]. Discharge weight ~215  Today he returns for HF follow up. Last week he was instructed to increase torsemide to 60 mg twice a day and take metolazone 5 mg for 3 days. Overall feeling better. Mild SOB walking in the house.  Denies PND/Orthopnea. No chest pain. Appetite ok. Eats crackers and frozen foods every day. Also drinks lots of fluids. No fever or chills. Weight at home has gone down 10 pounds.  pounds. Taking all medications, Lives alone.    Past Medical History:  Diagnosis Date  . Abdominal aortic atherosclerosis ([place])    by xray  . Arthritis    in lower back (06/24/2015)  . Atrial flutter ([place])    notes 06/24/2015  . AV block, Mobitz 1 09/06/2017   Archie Endo 09/06/2017  . 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 CAD (coronary artery disease) 2004   a. s/p CABG in 2004 with LIMA-LAD, SVG-RCA, SVG-OM, and SVG-RI  . Carotid stenosis 1999   s/p L CEA  . Chronic diastolic CHF (congestive heart failure) ([place])   . Colon polyp 2005   (Dr. Tiffany Kocher)  . Compression fracture of L1 lumbar vertebra (HCC) remote  . COPD (chronic obstructive pulmonary disease) ([place]) 03/2011   by xray  . COVID-19 09/06/2019  . Depression     hx  . GERD (gastroesophageal reflux disease) 2003   h/o duodenal ulcer per EGD as well as esophagitis  . History of colon polyps 2003, 2005   adenomatous Vira Agar)  . History of tobacco abuse   . HLD (hyperlipidemia)   . HTN (hypertension)   . Pneumonia due to COVID-19 virus 09/07/2019   Hospitalization 09/2019  . Presence of permanent cardiac pacemaker   . Psoriasis   . Renal artery stenosis ([place]) 2004   70% bilateral, followed by cards  . 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 Severe aortic stenosis   . T2DM (type 2 diabetes mellitus) ([place]) 1995  . Urge incontinence of urine     Current Outpatient Medications  Medication Sig Dispense Refill  . acetaminophen (TYLENOL) 500 MG tablet Take 1 tablet (500 mg total) by mouth 3 (three) times daily.    [name] amiodarone (PACERONE) 200 MG tablet Take 1 tablet (200 mg total) by mouth daily. 30 tablet 1  . apixaban (ELIQUIS) 2.5 MG TABS tablet Take 1 tablet (2.5 mg total) by mouth 2 (two) times daily. 60 tablet 3  . Ascorbic Acid (VITAMIN C PO) Take 1 tablet by mouth daily.    [name] atorvastatin (LIPITOR) 20 MG tablet Take  1 tablet (20 mg total) by mouth daily. 90 tablet 3  . BD INSULIN SYRINGE U/F 30G X 1/2" 0.5 ML MISC AS DIRECTED. 100 each 0  . budesonide-formoterol (SYMBICORT) 160-4.5 MCG/ACT inhaler Inhale 2 puffs into the lungs 2 (two) times daily. 1 Inhaler 2  . citalopram (CELEXA) 20 MG tablet Take 1 tablet (20 mg total) by mouth daily. 90 tablet 2  . finasteride (PROSCAR) 5 MG tablet Take 1 tablet (5 mg total) by mouth daily. 30 tablet 5  . glucose blood (ACCU-CHEK AVIVA PLUS) test strip Use as instructed to check blood sugar three times a day. Dx code E11.8 & E11.65 300 strip 1  . guaiFENesin (MUCINEX) 600 MG 12 hr tablet Take 600 mg by mouth 2 (two) times daily as needed for cough or to loosen phlegm.    . insulin glargine  (LANTUS) 100 UNIT/ML injection Lantus 22 units in the morning and 20 units at bedtime. 30 mL 0  . lisinopril (ZESTRIL) 10 MG tablet Take 1 tablet (10 mg total) by mouth daily. 30 tablet 6  . metolazone (ZAROXOLYN) 5 MG tablet Only take as directed by heart clinic 15 tablet 1  . nitroGLYCERIN (NITROSTAT) 0.4 MG SL tablet Place 1 tablet (0.4 mg total) under the tongue every 5 (five) minutes as needed for chest pain. 90 tablet 3  . pantoprazole (PROTONIX) 40 MG tablet Take 1 tablet (40 mg total) by mouth daily. Take 1 tablet (20 mg total) by mouth daily. 30 tablet 1  . potassium chloride SA (K-DUR) 20 MEQ tablet Take 1 tablet (20 mEq total) every Monday, Wednesday, Friday, Saturday, and Sunday. 30 tablet 11  . torsemide (DEMADEX) 20 MG tablet Take 3 tablets (60 mg total) by mouth 3 (three) times daily. TEMPORARY INCREASE IN DOSE    . traZODone (DESYREL) 50 MG tablet Take 1 tablet (50 mg total) by mouth at bedtime. 30 tablet 0  . insulin aspart (NOVOLOG) 100 UNIT/ML injection Inject 6 Units into the skin 3 (three) times daily with meals for 6 days.     No current facility-administered medications for this encounter.    Allergies  Allergen Reactions  . Gabapentin Other (See Comments)    Leg pain, weakness   . Metoprolol Swelling  . Spironolactone Other (See Comments)    Painful gynecomastia  . Amlodipine Other (See Comments)    Edema  . Other Other (See Comments)    Horse serum - myalgias  . Rosiglitazone Maleate Other (See Comments)     Did not help  . Tricor [Fenofibrate] Other (See Comments)    myalgias      Social History   Socioeconomic History  . Marital status: Widowed    Spouse name: Not on file  . Number of children: Not on file  . Years of education: Not on file  . Highest education level: Not on file  Occupational History  . Not on file  Tobacco Use  . Smoking status: Former Smoker    Packs/day: 0.50    Years: 36.00    Pack years: 18.00    Types: Cigarettes     Quit date: 07/23/2013    Years since quitting: 6.2  . Smokeless tobacco: Never Used  Substance and Sexual Activity  . Alcohol use: No  . Drug use: No  . Sexual activity: Not Currently  Other Topics Concern  . Not on file  Social History Narrative   Caffeine: 4 cups coffee/day, some tea and soda   Lives with wife  Occupation: Retired   Activity: no regular exercise   Diet: good water, fruits/vegetables daily   Social Determinants of Health   Financial Resource Strain: Low Risk   . Difficulty of Paying Living Expenses: Not hard at all  Food Insecurity: No Food Insecurity  . Worried About Charity fundraiser in the Last Year: Never true  . Ran Out of Food in the Last Year: Never true  Transportation Needs: No Transportation Needs  . Lack of Transportation (Medical): No  . Lack of Transportation (Non-Medical): No  Physical Activity:   . Days of Exercise per Week: Not on file  . Minutes of Exercise per Session: Not on file  Stress:   . Feeling of Stress : Not on file  Social Connections:   . Frequency of Communication with Friends and Family: Not on file  . Frequency of Social Gatherings with Friends and Family: Not on file  . Attends Religious Services: Not on file  . Active Member of Clubs or Organizations: Not on file  . Attends Archivist Meetings: Not on file  . Marital Status: Not on file  Intimate Partner Violence:   . Fear of Current or Ex-Partner: Not on file  . Emotionally Abused: Not on file  . Physically Abused: Not on file  . Sexually Abused: Not on file      Family History  Problem Relation Age of Onset  . Hypertension Father   . Heart disease Father   . Cancer Mother        GI cancer  . Diabetes Sister   . Heart disease Sister   . Stroke Sister   . Hypertension Sister   . Cancer Brother        Lung cancer - nonsmoker  . Cancer Maternal Grandmother   . Heart attack Maternal Grandfather   . Heart attack Paternal Grandmother   . Heart  attack Paternal Grandfather   . Cancer Brother        Pancreatic cancer  . Lung disease Brother     Vitals:   10/29/19 1514  BP: 118/70  Pulse: 82  SpO2: 92%  Weight: 102.7 kg (226 lb 6.4 oz)   Wt Readings from Last 3 Encounters:  10/29/19 102.7 kg (226 lb 6.4 oz)  10/25/19 106.4 kg (234 lb 9.6 oz)  10/08/19 99.3 kg (219 lb)     PHYSICAL EXAM: General:  Arrived in wheel chair.  No resp difficulty HEENT: normal Neck: supple. JVP 9-10 . Carotids 2+ bilat; no bruits. No lymphadenopathy or thryomegaly appreciated. Cor: PMI nondisplaced. Regular rate & rhythm. No rubs, gallops or murmurs. Lungs: clear on 2 liters Melbeta Abdomen: soft, nontender, distended. No hepatosplenomegaly. No bruits or masses. Good bowel sounds. Extremities: no cyanosis, clubbing, rash, R and LLE 1+edema Neuro: alert & orientedx3, cranial nerves grossly intact. moves all 4 extremities w/o difficulty. Affect pleasant     ASSESSMENT & PLAN:  1. Chronic diastolic (congestive) heart failure  - EF of 60-65% with mild LVH, grade 2 DD, and pulmonary HTN with PA  40-45 mmHg.  -NYHA III. Volume status improved but still with some fluid on board.  - Continue torsemide 60 mg twice a day. Aim to keep home weight < 224 pounds.  - Hold off on metolazone for now. Check BMET today . Suspect he will need metolazone at least weekly.  - Discussed at length low salt foods choices and limiting fluids to < 2 liter per day.   2. Aortic stenosis S/P  TAVR (transcatheter aortic valve replacement) - S/P TAVR March 2020  3. Coronary artery disease involving native coronary artery of native heart without angina pectoris - s/p CABG - cath 10/23/2018- 4/4 patent grafts -No chest pain.  - management per Dr. [name] - Will eventually need SGLT2i   3. H/o heart block S/P placement of cardiac pacemaker  - 04/21/18 St Jude  4. CKD stage 3 due to type 2 diabetes mellitus  - SCr baseline around 1.5. - Check BMET today.   5. COPD with  Chronic respiratory failure  On oxygen  6. Paroxysmal atrial fib/flutter (HCC) -  On Amiodarone and Eliquis - PPM interrogated personally has had recent flare of AF.   Follow up in 2 weeks to reassess volume status. . Greater than 50% of the (total minutes 25) visit spent in counseling/coordination of care regarding the above.       Darrick Grinder, NP  3:37 PM

## 2019-10-30 ENCOUNTER — Telehealth (HOSPITAL_COMMUNITY): Payer: Self-pay | Admitting: *Deleted

## 2019-10-30 MED ORDER — POTASSIUM CHLORIDE CRYS ER 20 MEQ PO TBCR: EXTENDED_RELEASE_TABLET | ORAL | 11 refills | Status: AC

## 2019-10-30 MED ORDER — METOLAZONE 5 MG PO TABS: 5.0000 mg | ORAL_TABLET | ORAL | 1 refills | Status: DC

## 2019-10-30 NOTE — Telephone Encounter (Signed)
-----   Message from Conrad Mattoon, NP sent at 10/29/2019  4:49 PM EST ----- Please call his daughter in law and instruct to start metolazone 5 mg twice a week.  Take an extra 20 meq of potassium on metolazone days.

## 2019-10-31 DIAGNOSIS — J440 Chronic obstructive pulmonary disease with acute lower respiratory infection: Secondary | ICD-10-CM | POA: Diagnosis not present

## 2019-10-31 DIAGNOSIS — J441 Chronic obstructive pulmonary disease with (acute) exacerbation: Secondary | ICD-10-CM | POA: Diagnosis not present

## 2019-10-31 DIAGNOSIS — J9621 Acute and chronic respiratory failure with hypoxia: Secondary | ICD-10-CM | POA: Diagnosis not present

## 2019-11-02 DIAGNOSIS — J9621 Acute and chronic respiratory failure with hypoxia: Secondary | ICD-10-CM | POA: Diagnosis not present

## 2019-11-02 DIAGNOSIS — J441 Chronic obstructive pulmonary disease with (acute) exacerbation: Secondary | ICD-10-CM | POA: Diagnosis not present

## 2019-11-02 DIAGNOSIS — J440 Chronic obstructive pulmonary disease with acute lower respiratory infection: Secondary | ICD-10-CM | POA: Diagnosis not present

## 2019-11-06 ENCOUNTER — Other Ambulatory Visit: Payer: Self-pay | Admitting: Physician Assistant

## 2019-11-06 ENCOUNTER — Other Ambulatory Visit: Payer: Self-pay

## 2019-11-06 DIAGNOSIS — Z952 Presence of prosthetic heart valve: Secondary | ICD-10-CM

## 2019-11-06 DIAGNOSIS — J441 Chronic obstructive pulmonary disease with (acute) exacerbation: Secondary | ICD-10-CM | POA: Diagnosis not present

## 2019-11-06 DIAGNOSIS — J9621 Acute and chronic respiratory failure with hypoxia: Secondary | ICD-10-CM | POA: Diagnosis not present

## 2019-11-06 DIAGNOSIS — J440 Chronic obstructive pulmonary disease with acute lower respiratory infection: Secondary | ICD-10-CM | POA: Diagnosis not present

## 2019-11-12 ENCOUNTER — Ambulatory Visit (HOSPITAL_BASED_OUTPATIENT_CLINIC_OR_DEPARTMENT_OTHER)
Admission: RE | Admit: 2019-11-12 | Discharge: 2019-11-12 | Disposition: A | Discharge status: Home / Self Care | Payer: Medicare Other | Source: Ambulatory Visit | Attending: Adult Health | Admitting: Adult Health

## 2019-11-12 ENCOUNTER — Encounter: Payer: Self-pay | Admitting: Family Medicine

## 2019-11-12 ENCOUNTER — Other Ambulatory Visit: Payer: Self-pay

## 2019-11-12 ENCOUNTER — Telehealth (HOSPITAL_COMMUNITY): Payer: Self-pay

## 2019-11-12 ENCOUNTER — Encounter (HOSPITAL_COMMUNITY): Payer: Self-pay

## 2019-11-12 ENCOUNTER — Ambulatory Visit (HOSPITAL_COMMUNITY)
Admission: RE | Admit: 2019-11-12 | Discharge: 2019-11-12 | Disposition: A | Discharge status: Home / Self Care | Payer: Medicare Other | Source: Ambulatory Visit | Attending: Adult Health | Admitting: Adult Health

## 2019-11-12 VITALS — BP 119/61 | HR 97 | Wt 227.2 lb

## 2019-11-12 DIAGNOSIS — M1990 Unspecified osteoarthritis, unspecified site: Secondary | ICD-10-CM | POA: Diagnosis not present

## 2019-11-12 DIAGNOSIS — I701 Atherosclerosis of renal artery: Secondary | ICD-10-CM | POA: Insufficient documentation

## 2019-11-12 DIAGNOSIS — Z9981 Dependence on supplemental oxygen: Secondary | ICD-10-CM | POA: Insufficient documentation

## 2019-11-12 DIAGNOSIS — F329 Major depressive disorder, single episode, unspecified: Secondary | ICD-10-CM | POA: Diagnosis not present

## 2019-11-12 DIAGNOSIS — J9610 Chronic respiratory failure, unspecified whether with hypoxia or hypercapnia: Secondary | ICD-10-CM | POA: Diagnosis not present

## 2019-11-12 DIAGNOSIS — Z951 Presence of aortocoronary bypass graft: Secondary | ICD-10-CM | POA: Insufficient documentation

## 2019-11-12 DIAGNOSIS — Z79899 Other long term (current) drug therapy: Secondary | ICD-10-CM | POA: Insufficient documentation

## 2019-11-12 DIAGNOSIS — I4892 Unspecified atrial flutter: Secondary | ICD-10-CM

## 2019-11-12 DIAGNOSIS — Z87891 Personal history of nicotine dependence: Secondary | ICD-10-CM | POA: Diagnosis not present

## 2019-11-12 DIAGNOSIS — Z952 Presence of prosthetic heart valve: Secondary | ICD-10-CM | POA: Diagnosis not present

## 2019-11-12 DIAGNOSIS — I5032 Chronic diastolic (congestive) heart failure: Secondary | ICD-10-CM | POA: Diagnosis not present

## 2019-11-12 DIAGNOSIS — E1122 Type 2 diabetes mellitus with diabetic chronic kidney disease: Secondary | ICD-10-CM | POA: Insufficient documentation

## 2019-11-12 DIAGNOSIS — Z8616 Personal history of COVID-19: Secondary | ICD-10-CM | POA: Insufficient documentation

## 2019-11-12 DIAGNOSIS — I272 Pulmonary hypertension, unspecified: Secondary | ICD-10-CM | POA: Insufficient documentation

## 2019-11-12 DIAGNOSIS — Z794 Long term (current) use of insulin: Secondary | ICD-10-CM | POA: Insufficient documentation

## 2019-11-12 DIAGNOSIS — G2581 Restless legs syndrome: Secondary | ICD-10-CM | POA: Diagnosis not present

## 2019-11-12 DIAGNOSIS — I480 Paroxysmal atrial fibrillation: Secondary | ICD-10-CM | POA: Diagnosis not present

## 2019-11-12 DIAGNOSIS — I130 Hypertensive heart and chronic kidney disease with heart failure and stage 1 through stage 4 chronic kidney disease, or unspecified chronic kidney disease: Secondary | ICD-10-CM | POA: Diagnosis not present

## 2019-11-12 DIAGNOSIS — Z7901 Long term (current) use of anticoagulants: Secondary | ICD-10-CM | POA: Diagnosis not present

## 2019-11-12 DIAGNOSIS — I350 Nonrheumatic aortic (valve) stenosis: Secondary | ICD-10-CM | POA: Insufficient documentation

## 2019-11-12 DIAGNOSIS — I2510 Atherosclerotic heart disease of native coronary artery without angina pectoris: Secondary | ICD-10-CM | POA: Insufficient documentation

## 2019-11-12 DIAGNOSIS — N183 Chronic kidney disease, stage 3 unspecified: Secondary | ICD-10-CM | POA: Insufficient documentation

## 2019-11-12 DIAGNOSIS — Z8249 Family history of ischemic heart disease and other diseases of the circulatory system: Secondary | ICD-10-CM | POA: Diagnosis not present

## 2019-11-12 DIAGNOSIS — J449 Chronic obstructive pulmonary disease, unspecified: Secondary | ICD-10-CM | POA: Diagnosis not present

## 2019-11-12 DIAGNOSIS — E785 Hyperlipidemia, unspecified: Secondary | ICD-10-CM | POA: Diagnosis not present

## 2019-11-12 LAB — BASIC METABOLIC PANEL
Anion gap: 11 (ref 5–15)
BUN: 57 mg/dL — ABNORMAL HIGH (ref 8–23)
CO2: 30 mmol/L (ref 22–32)
Calcium: 9.5 mg/dL (ref 8.9–10.3)
Chloride: 96 mmol/L — ABNORMAL LOW (ref 98–111)
Creatinine, Ser: 2.24 mg/dL — ABNORMAL HIGH (ref 0.61–1.24)
GFR calc Af Amer: 30 mL/min — ABNORMAL LOW (ref >60)
GFR calc non Af Amer: 26 mL/min — ABNORMAL LOW (ref >60)
Glucose, Bld: 63 mg/dL — ABNORMAL LOW (ref 70–99)
Potassium: 4.7 mmol/L (ref 3.5–5.1)
Sodium: 137 mmol/L (ref 135–145)

## 2019-11-12 LAB — ECHOCARDIOGRAM COMPLETE: Weight: 3635.2 oz

## 2019-11-12 LAB — MAGNESIUM: Magnesium: 1.8 mg/dL (ref 1.7–2.4)

## 2019-11-12 MED ORDER — MAG-OXIDE 200 MG PO TABS: 200.0000 mg | ORAL_TABLET | Freq: Every day | ORAL | 3 refills | Status: AC

## 2019-11-12 MED ORDER — METOLAZONE 5 MG PO TABS: 5.0000 mg | ORAL_TABLET | ORAL | 2 refills | Status: DC

## 2019-11-12 NOTE — Telephone Encounter (Signed)
-----   Message from Conrad Garden, NP sent at 11/12/2019 12:43 PM EST ----- Please call. Renal function trending up. Hold torsemide and potassium x2 days. Then restart torsemide and potassium at current dose.  Cut back metolazone to once a week on Friday. Repeat BMEt in 7 days.  Mag low. Add 200 mg mag oxide daily.

## 2019-11-12 NOTE — Patient Instructions (Signed)
Lab work done today. We will notify you of any abnormal lab work. No news is good news!  Please follow up with the [facility] in 4 weeks.  At the [facility], you and your health needs are our priority. As part of our continuing mission to provide you with exceptional heart care, we have created designated Provider Care Teams. These Care Teams include your primary Cardiologist (physician) and Advanced Practice Providers (APPs- Physician Assistants and Nurse Practitioners) who all work together to provide you with the care you need, when you need it.   You may see any of the following providers on your designated Care Team at your next follow up: . Dr Daniel Bensimhon . Dr Dalton McLean . Amy Clegg, NP . Brittainy Simmons, PA . Lauren Kemp, PharmD   Please be sure to bring in all your medications bottles to every appointment.    

## 2019-11-12 NOTE — Progress Notes (Signed)
ReDS Vest / Clip - 11/12/19 1100      ReDS Vest / Clip   Station Marker  C    Ruler Value  30    ReDS Value Range  Moderate volume overload    ReDS Actual Value  37    Anatomical Comments  sitting

## 2019-11-12 NOTE — Progress Notes (Addendum)
[facility]   Primary Cardiologist: [name] HF Cardiology: Dr Haroldine Laws PCP: Dr Leo Grosser  HPI: Charles [name] is a 86 y.o. male with a hx of chronic diastolic HF with frequent decompensations and admissions, CAD s/p CABG x 4 (LIMA to LAD, SVG to RCA, OM branch to ramus) in 2004, aortic stenosis s/p TAVR 12/22/2018, PPM July 2019, HTN, HLD, COPD on chronic O2, PAF/ flutter s/p TEE cardioversion in the remote past, DM, and renal artery stenosis with 70% bilateral stenosis referred by Dr. [name] for further management of his HF .   His last echo was 08/2019 and showed and EF of 60-65% with mild LVH, grade 2 DD, and pulmonary HTN with PA  40-45 mmHg.   He was admitted 12/03- 09/11/2019 with COVID pneumonia. Discharged to [facility]. Discharge weight ~215  Today he returns for HF follow up. Last visit metolazone was started twice a week. Overall feeling ok but complaining of his legs "jumping". Says he has had restless leg syndrome for 5-6 years.  SOB with exertion but able to work with PT at home. Using 2 liters oxygen.  Denies PND/Orthopnea. His daughter says he is drinking lots of fluid and eating soups.  No fever or chills. Weight at home 225 pounds. Taking all medications.    Past Medical History:  Diagnosis Date  . Abdominal aortic atherosclerosis ([place])    by xray  . Arthritis    in lower back (06/24/2015)  . Atrial flutter ([place])    notes 06/24/2015  . AV block, [place] 1 09/06/2017   Archie Endo 09/06/2017  . 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 CAD (coronary artery disease) 2004   a. s/p CABG in 2004 with LIMA-LAD, SVG-RCA, SVG-OM, and SVG-RI  . Carotid stenosis 1999   s/p L CEA  . Chronic diastolic CHF (congestive heart failure) ([place])   . Colon polyp 2005   (Dr. Tiffany Kocher)  . Compression fracture of L1 lumbar vertebra (HCC) remote  . COPD (chronic obstructive pulmonary disease) ([place]) 03/2011   by xray  . COVID-19 09/06/2019  . Depression    hx    . GERD (gastroesophageal reflux disease) 2003   h/o duodenal ulcer per EGD as well as esophagitis  . History of colon polyps 2003, 2005   adenomatous Vira Agar)  . History of tobacco abuse   . HLD (hyperlipidemia)   . HTN (hypertension)   . Pneumonia due to COVID-19 virus 09/07/2019   Hospitalization 09/2019  . Presence of permanent cardiac pacemaker   . Psoriasis   . Renal artery stenosis ([place]) 2004   70% bilateral, followed by cards  . 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 Severe aortic stenosis   . T2DM (type 2 diabetes mellitus) ([place]) 1995  . Urge incontinence of urine     Current Outpatient Medications  Medication Sig Dispense Refill  . acetaminophen (TYLENOL) 500 MG tablet Take 1 tablet (500 mg total) by mouth 3 (three) times daily.    [name] amiodarone (PACERONE) 200 MG tablet Take 1 tablet (200 mg total) by mouth daily. 30 tablet 1  . apixaban (ELIQUIS) 2.5 MG TABS tablet Take 1 tablet (2.5 mg total) by mouth 2 (two) times daily. 60 tablet 3  . Ascorbic Acid (VITAMIN C PO) Take 1 tablet by mouth daily.    [name] atorvastatin (LIPITOR) 20 MG tablet  Take 1 tablet (20 mg total) by mouth daily. 90 tablet 3  . BD INSULIN SYRINGE U/F 30G X 1/2" 0.5 ML MISC AS DIRECTED. 100 each 0  . budesonide-formoterol (SYMBICORT) 160-4.5 MCG/ACT inhaler Inhale 2 puffs into the lungs 2 (two) times daily. 1 Inhaler 2  . citalopram (CELEXA) 20 MG tablet Take 1 tablet (20 mg total) by mouth daily. 90 tablet 2  . finasteride (PROSCAR) 5 MG tablet Take 1 tablet (5 mg total) by mouth daily. 30 tablet 5  . glucose blood (ACCU-CHEK AVIVA PLUS) test strip Use as instructed to check blood sugar three times a day. Dx code E11.8 & E11.65 300 strip 1  . guaiFENesin (MUCINEX) 600 MG 12 hr tablet Take 600 mg by mouth 2 (two) times daily as needed for cough or to loosen phlegm.    . insulin aspart (NOVOLOG)  100 UNIT/ML injection Inject 6 Units into the skin 3 (three) times daily with meals for 6 days.    . insulin glargine (LANTUS) 100 UNIT/ML injection Lantus 22 units in the morning and 20 units at bedtime. 30 mL 0  . lisinopril (ZESTRIL) 10 MG tablet Take 1 tablet (10 mg total) by mouth daily. 30 tablet 6  . metolazone (ZAROXOLYN) 5 MG tablet Take 1 tablet (5 mg total) by mouth 2 (two) times a week. 15 tablet 1  . nitroGLYCERIN (NITROSTAT) 0.4 MG SL tablet Place 1 tablet (0.4 mg total) under the tongue every 5 (five) minutes as needed for chest pain. 90 tablet 3  . pantoprazole (PROTONIX) 40 MG tablet Take 1 tablet (40 mg total) by mouth daily. Take 1 tablet (20 mg total) by mouth daily. 30 tablet 1  . potassium chloride SA (KLOR-CON) 20 MEQ tablet Take 1 tablet (20 mEq total) every Monday, Wednesday, Friday, Saturday, and Sunday. Take an additional 26meq with Metolazone. 30 tablet 11  . torsemide (DEMADEX) 20 MG tablet Take 3 tablets (60 mg total) by mouth 3 (three) times daily. TEMPORARY INCREASE IN DOSE    . traZODone (DESYREL) 50 MG tablet Take 1 tablet (50 mg total) by mouth at bedtime. 30 tablet 0   No current facility-administered medications for this encounter.    Allergies  Allergen Reactions  . Gabapentin Other (See Comments)    Leg pain, weakness   . Metoprolol Swelling  . Spironolactone Other (See Comments)    Painful gynecomastia  . Amlodipine Other (See Comments)    Edema  . Other Other (See Comments)    Horse serum - myalgias  . Rosiglitazone Maleate Other (See Comments)     Did not help  . Tricor [Fenofibrate] Other (See Comments)    myalgias      Social History   Socioeconomic History  . Marital status: Widowed    Spouse name: Not on file  . Number of children: Not on file  . Years of education: Not on file  . Highest education level: Not on file  Occupational History  . Not on file  Tobacco Use  . Smoking status: Former Smoker    Packs/day: 0.50    Years:  36.00    Pack years: 18.00    Types: Cigarettes    Quit date: 07/23/2013    Years since quitting: 6.3  . Smokeless tobacco: Never Used  Substance and Sexual Activity  . Alcohol use: No  . Drug use: No  . Sexual activity: Not Currently  Other Topics Concern  . Not on file  Social History Narrative  Caffeine: 4 cups coffee/day, some tea and soda   Lives with wife   Occupation: Retired   Activity: no regular exercise   Diet: good water, fruits/vegetables daily   Social Determinants of Health   Financial Resource Strain: Low Risk   . Difficulty of Paying Living Expenses: Not hard at all  Food Insecurity: No Food Insecurity  . Worried About Charity fundraiser in the Last Year: Never true  . Ran Out of Food in the Last Year: Never true  Transportation Needs: No Transportation Needs  . Lack of Transportation (Medical): No  . Lack of Transportation (Non-Medical): No  Physical Activity:   . Days of Exercise per Week: Not on file  . Minutes of Exercise per Session: Not on file  Stress:   . Feeling of Stress : Not on file  Social Connections:   . Frequency of Communication with Friends and Family: Not on file  . Frequency of Social Gatherings with Friends and Family: Not on file  . Attends Religious Services: Not on file  . Active Member of Clubs or Organizations: Not on file  . Attends Archivist Meetings: Not on file  . Marital Status: Not on file  Intimate Partner Violence:   . Fear of Current or Ex-Partner: Not on file  . Emotionally Abused: Not on file  . Physically Abused: Not on file  . Sexually Abused: Not on file      Family History  Problem Relation Age of Onset  . Hypertension Father   . Heart disease Father   . Cancer Mother        GI cancer  . Diabetes Sister   . Heart disease Sister   . Stroke Sister   . Hypertension Sister   . Cancer Brother        Lung cancer - nonsmoker  . Cancer Maternal Grandmother   . Heart attack Maternal Grandfather    . Heart attack Paternal Grandmother   . Heart attack Paternal Grandfather   . Cancer Brother        Pancreatic cancer  . Lung disease Brother     Vitals:   11/12/19 1111  BP: 119/61  Pulse: 97  SpO2: 92%  Weight: 103.1 kg (227 lb 3.2 oz)   Wt Readings from Last 3 Encounters:  11/12/19 103.1 kg (227 lb 3.2 oz)  10/29/19 102.7 kg (226 lb 6.4 oz)  10/25/19 106.4 kg (234 lb 9.6 oz)   Reds Clip 37%.   PHYSICAL EXAM: General:  Appears chronically ill. No resp difficulty. Arrived in a wheel chair.  HEENT: normal Neck: supple. JVP 6-7. Carotids 2+ bilat; no bruits. No lymphadenopathy or thryomegaly appreciated. Cor: PMI nondisplaced. Regular rate & rhythm. No rubs, gallops or murmurs. Lungs: clear on 2 liters [place] oxygen .  Abdomen: soft, nontender, nondistended. No hepatosplenomegaly. No bruits or masses. Good bowel sounds. Extremities: no cyanosis, clubbing, rash, R and LLE 1+ edema Neuro: alert & orientedx3, cranial nerves grossly intact. moves all 4 extremities w/o difficulty. Affect pleasant  ASSESSMENT & PLAN:  1. Chronic diastolic (congestive) heart failure  - EF of 60-65% with mild LVH, grade 2 DD, and pulmonary HTN with PA  40-45 mmHg.  -NYHA III. Volume status ok. Reds Clip 37%. Continue torsemide 60 mg twice a day. Aim to keep home weight ~ 225 pounds.  - Continue metolazone 5 mg twice a week. - Check BMET  - Discussed low salt food choices and limiting fluid intake to < 2  liters.    2. Aortic stenosis S/P TAVR (transcatheter aortic valve replacement) - S/P TAVR March 2020  3. Coronary artery disease involving native coronary artery of native heart without angina pectoris - s/p CABG - cath 10/23/2018- 4/4 patent grafts - No chest pain.  - management per Dr. [name] - Will eventually need SGLT2i   3. H/o heart block S/P placement of cardiac pacemaker  - 04/21/18 [employer]  4. CKD stage 3 due to type 2 diabetes mellitus  - SCr baseline around 1.5. - Check BMET    5. COPD with Chronic respiratory failure  -Continue oxygen.   6. Paroxysmal atrial fib/flutter (HCC) -  On Amiodarone and Eliquis - PPM interrogated personally has had recent flare of AF.    7. Restless Leg Syndrome  - Chronic isssue.  - Referred back to PCP   Follow up 4 weeks to reassess volume status. Check Red Clip at that time.    Darrick Grinder, NP  11:26 AM  [facility] Cardiomyopathy Questionnaire  KCCQ-12 11/13/2019  1 a. Ability to shower/bathe Slightly limited  1 b. Ability to walk 1 block Extremely limited  1 c. Ability to hurry/jog Other, Did not do  2. Edema feet/ankles/legs Every morning  3. Limited by fatigue At least once a day  4. Limited by dyspnea All of the time  5. Sitting up / on 3+ pillows Never over the past 2 weeks  6. Limited enjoyment of life Limited quite a bit  7. Rest of life w/ symptoms Mostly dissatisfied  8 a. Participation in hobbies N/A, did not do for other reasons  8 b. Participation in chores N/A, did not do for other reasons  8 c. Visiting family/friends N/A, did not do for other reasons

## 2019-11-12 NOTE — Progress Notes (Signed)
Echocardiogram 2D Echocardiogram has been performed.  Oneal Deputy Obert Espindola 11/12/2019, 12:56 PM

## 2019-11-12 NOTE — Telephone Encounter (Signed)
Called and relayed information to daughter in law [name]. Verbalized understanding. meds changed and sent to pharmacy. Lab work sent to [facility] [employer].

## 2019-11-13 DIAGNOSIS — J441 Chronic obstructive pulmonary disease with (acute) exacerbation: Secondary | ICD-10-CM | POA: Diagnosis not present

## 2019-11-13 DIAGNOSIS — J440 Chronic obstructive pulmonary disease with acute lower respiratory infection: Secondary | ICD-10-CM | POA: Diagnosis not present

## 2019-11-13 DIAGNOSIS — J9621 Acute and chronic respiratory failure with hypoxia: Secondary | ICD-10-CM | POA: Diagnosis not present

## 2019-11-13 NOTE — Addendum Note (Signed)
Encounter addended by: Eileen Stanford, PA-C on: 11/13/2019 8:04 AM  Actions taken: Flowsheet accepted, Clinical Note Signed

## 2019-11-21 ENCOUNTER — Other Ambulatory Visit: Payer: Self-pay

## 2019-11-21 DIAGNOSIS — I5033 Acute on chronic diastolic (congestive) heart failure: Secondary | ICD-10-CM | POA: Diagnosis not present

## 2019-11-21 DIAGNOSIS — Z79899 Other long term (current) drug therapy: Secondary | ICD-10-CM | POA: Diagnosis not present

## 2019-11-21 MED ORDER — ATORVASTATIN CALCIUM 20 MG PO TABS: 20.0000 mg | ORAL_TABLET | Freq: Every day | ORAL | 1 refills | Status: AC

## 2019-11-21 NOTE — Telephone Encounter (Signed)
E-scribed refill 

## 2019-11-22 LAB — BASIC METABOLIC PANEL
BUN/Creatinine Ratio: 20 (ref 10–24)
BUN: 37 mg/dL — ABNORMAL HIGH (ref 8–27)
CO2: 29 mmol/L (ref 20–29)
Calcium: 9.1 mg/dL (ref 8.6–10.2)
Chloride: 98 mmol/L (ref 96–106)
Creatinine, Ser: 1.85 mg/dL — ABNORMAL HIGH (ref 0.76–1.27)
GFR calc Af Amer: 38 mL/min/{1.73_m2} — ABNORMAL LOW (ref >59)
GFR calc non Af Amer: 32 mL/min/{1.73_m2} — ABNORMAL LOW (ref >59)
Glucose: 97 mg/dL (ref 65–99)
Potassium: 5 mmol/L (ref 3.5–5.2)
Sodium: 144 mmol/L (ref 134–144)

## 2019-11-27 DIAGNOSIS — I5033 Acute on chronic diastolic (congestive) heart failure: Secondary | ICD-10-CM | POA: Diagnosis not present

## 2019-12-03 ENCOUNTER — Telehealth: Payer: Self-pay | Admitting: Family Medicine

## 2019-12-03 NOTE — Chronic Care Management (AMB) (Signed)
  Chronic Care Management   Note  12/03/2019 Name: Charles [name] MRN: [id number] DOB: [birth year]  Charles [name] is a 86 y.o. year old male who is a primary care patient of [name], MD. I reached out to [name] by phone today in response to a referral sent by Mr. Junie Spencer Plaut's PCP, [name], MD. Family member, Claiborne Rigg gave verbal consent for CCM. [name] will be talking to the Pharmacist on the behalf of [employer].  Mr. Fritze was given information about Chronic Care Management services today including:  1. CCM service includes personalized support from designated clinical staff supervised by his physician, including individualized plan of care and coordination with other care providers 2. 24/7 contact phone numbers for assistance for urgent and routine care needs. 3. Service will only be billed when office clinical staff spend 20 minutes or more in a month to coordinate care. 4. Only one practitioner may furnish and bill the service in a calendar month. 5. The patient may stop CCM services at any time (effective at the end of the month) by phone call to the office staff. 6. The patient will be responsible for cost sharing (co-pay) of up to 20% of the service fee (after annual deductible is met).  Patient agreed to services and verbal consent obtained.   Follow up plan:   Raynicia Dukes [employer] Scheduler

## 2019-12-18 ENCOUNTER — Encounter: Payer: Self-pay | Admitting: Family Medicine

## 2019-12-18 DIAGNOSIS — E440 Moderate protein-calorie malnutrition: Secondary | ICD-10-CM

## 2019-12-18 DIAGNOSIS — I5032 Chronic diastolic (congestive) heart failure: Secondary | ICD-10-CM

## 2019-12-18 DIAGNOSIS — R296 Repeated falls: Secondary | ICD-10-CM

## 2019-12-18 DIAGNOSIS — M5136 Other intervertebral disc degeneration, lumbar region: Secondary | ICD-10-CM

## 2019-12-18 DIAGNOSIS — M159 Polyosteoarthritis, unspecified: Secondary | ICD-10-CM

## 2019-12-18 DIAGNOSIS — J9610 Chronic respiratory failure, unspecified whether with hypoxia or hypercapnia: Secondary | ICD-10-CM

## 2019-12-19 ENCOUNTER — Encounter (HOSPITAL_COMMUNITY): Payer: Medicare Other | Admitting: Internal Medicine

## 2019-12-19 ENCOUNTER — Encounter: Payer: Self-pay | Admitting: Emergency Medicine

## 2019-12-19 ENCOUNTER — Emergency Department: Payer: Medicare Other

## 2019-12-19 ENCOUNTER — Other Ambulatory Visit: Payer: Self-pay

## 2019-12-19 ENCOUNTER — Emergency Department
Admission: EM | Admit: 2019-12-19 | Discharge: 2019-12-19 | Disposition: A | Discharge status: Home / Self Care | Payer: Medicare Other | Attending: Emergency Medicine | Admitting: Emergency Medicine

## 2019-12-19 DIAGNOSIS — Z87891 Personal history of nicotine dependence: Secondary | ICD-10-CM | POA: Diagnosis not present

## 2019-12-19 DIAGNOSIS — I2510 Atherosclerotic heart disease of native coronary artery without angina pectoris: Secondary | ICD-10-CM | POA: Insufficient documentation

## 2019-12-19 DIAGNOSIS — I130 Hypertensive heart and chronic kidney disease with heart failure and stage 1 through stage 4 chronic kidney disease, or unspecified chronic kidney disease: Secondary | ICD-10-CM | POA: Diagnosis not present

## 2019-12-19 DIAGNOSIS — R531 Weakness: Secondary | ICD-10-CM | POA: Diagnosis not present

## 2019-12-19 DIAGNOSIS — Y9389 Activity, other specified: Secondary | ICD-10-CM | POA: Insufficient documentation

## 2019-12-19 DIAGNOSIS — Z794 Long term (current) use of insulin: Secondary | ICD-10-CM | POA: Diagnosis not present

## 2019-12-19 DIAGNOSIS — W19XXXA Unspecified fall, initial encounter: Secondary | ICD-10-CM | POA: Diagnosis not present

## 2019-12-19 DIAGNOSIS — Z8616 Personal history of COVID-19: Secondary | ICD-10-CM | POA: Insufficient documentation

## 2019-12-19 DIAGNOSIS — N183 Chronic kidney disease, stage 3 unspecified: Secondary | ICD-10-CM | POA: Diagnosis not present

## 2019-12-19 DIAGNOSIS — M25461 Effusion, right knee: Secondary | ICD-10-CM | POA: Diagnosis not present

## 2019-12-19 DIAGNOSIS — E1122 Type 2 diabetes mellitus with diabetic chronic kidney disease: Secondary | ICD-10-CM | POA: Diagnosis not present

## 2019-12-19 DIAGNOSIS — I5032 Chronic diastolic (congestive) heart failure: Secondary | ICD-10-CM | POA: Diagnosis not present

## 2019-12-19 DIAGNOSIS — Y999 Unspecified external cause status: Secondary | ICD-10-CM | POA: Insufficient documentation

## 2019-12-19 DIAGNOSIS — Z951 Presence of aortocoronary bypass graft: Secondary | ICD-10-CM | POA: Insufficient documentation

## 2019-12-19 DIAGNOSIS — Z743 Need for continuous supervision: Secondary | ICD-10-CM | POA: Diagnosis not present

## 2019-12-19 DIAGNOSIS — Z950 Presence of cardiac pacemaker: Secondary | ICD-10-CM | POA: Diagnosis not present

## 2019-12-19 DIAGNOSIS — Y929 Unspecified place or not applicable: Secondary | ICD-10-CM | POA: Insufficient documentation

## 2019-12-19 DIAGNOSIS — Z7901 Long term (current) use of anticoagulants: Secondary | ICD-10-CM | POA: Insufficient documentation

## 2019-12-19 DIAGNOSIS — Z79899 Other long term (current) drug therapy: Secondary | ICD-10-CM | POA: Insufficient documentation

## 2019-12-19 DIAGNOSIS — J449 Chronic obstructive pulmonary disease, unspecified: Secondary | ICD-10-CM | POA: Diagnosis not present

## 2019-12-19 DIAGNOSIS — T07XXXA Unspecified multiple injuries, initial encounter: Secondary | ICD-10-CM | POA: Diagnosis not present

## 2019-12-19 DIAGNOSIS — S8001XA Contusion of right knee, initial encounter: Secondary | ICD-10-CM

## 2019-12-19 DIAGNOSIS — R609 Edema, unspecified: Secondary | ICD-10-CM | POA: Diagnosis not present

## 2019-12-19 DIAGNOSIS — S8991XA Unspecified injury of right lower leg, initial encounter: Secondary | ICD-10-CM | POA: Diagnosis present

## 2019-12-19 LAB — CBC WITH DIFFERENTIAL/PLATELET
Abs Immature Granulocytes: 0.08 10*3/uL — ABNORMAL HIGH (ref 0.00–0.07)
Basophils Absolute: 0.1 10*3/uL (ref 0.0–0.1)
Basophils Relative: 1 %
Eosinophils Absolute: 0.2 10*3/uL (ref 0.0–0.5)
Eosinophils Relative: 2 %
HCT: 31.8 % — ABNORMAL LOW (ref 39.0–52.0)
Hemoglobin: 10.1 g/dL — ABNORMAL LOW (ref 13.0–17.0)
Immature Granulocytes: 1 %
Lymphocytes Relative: 13 %
Lymphs Abs: 1.1 10*3/uL (ref 0.7–4.0)
MCH: 29.4 pg (ref 26.0–34.0)
MCHC: 31.8 g/dL (ref 30.0–36.0)
MCV: 92.4 fL (ref 80.0–100.0)
Monocytes Absolute: 0.9 10*3/uL (ref 0.1–1.0)
Monocytes Relative: 11 %
Neutro Abs: 6 10*3/uL (ref 1.7–7.7)
Neutrophils Relative %: 72 %
Platelets: 176 10*3/uL (ref 150–400)
RBC: 3.44 MIL/uL — ABNORMAL LOW (ref 4.22–5.81)
RDW: 13.7 % (ref 11.5–15.5)
WBC: 8.2 10*3/uL (ref 4.0–10.5)
nRBC: 0 % (ref 0.0–0.2)

## 2019-12-19 LAB — COMPREHENSIVE METABOLIC PANEL
ALT: 15 U/L (ref 0–44)
AST: 19 U/L (ref 15–41)
Albumin: 3.9 g/dL (ref 3.5–5.0)
Alkaline Phosphatase: 76 U/L (ref 38–126)
Anion gap: 11 (ref 5–15)
BUN: 44 mg/dL — ABNORMAL HIGH (ref 8–23)
CO2: 31 mmol/L (ref 22–32)
Calcium: 9.2 mg/dL (ref 8.9–10.3)
Chloride: 94 mmol/L — ABNORMAL LOW (ref 98–111)
Creatinine, Ser: 1.96 mg/dL — ABNORMAL HIGH (ref 0.61–1.24)
GFR calc Af Amer: 35 mL/min — ABNORMAL LOW (ref >60)
GFR calc non Af Amer: 30 mL/min — ABNORMAL LOW (ref >60)
Glucose, Bld: 300 mg/dL — ABNORMAL HIGH (ref 70–99)
Potassium: 4.4 mmol/L (ref 3.5–5.1)
Sodium: 136 mmol/L (ref 135–145)
Total Bilirubin: 0.6 mg/dL (ref 0.3–1.2)
Total Protein: 7.5 g/dL (ref 6.5–8.1)

## 2019-12-19 LAB — BRAIN NATRIURETIC PEPTIDE: B Natriuretic Peptide: 248 pg/mL — ABNORMAL HIGH (ref 0.0–100.0)

## 2019-12-19 LAB — TROPONIN I (HIGH SENSITIVITY): Troponin I (High Sensitivity): 54 ng/L — ABNORMAL HIGH (ref <18)

## 2019-12-19 MED ORDER — MORPHINE SULFATE (PF) 4 MG/ML IV SOLN
4.0000 mg | Freq: Once | INTRAVENOUS | Status: AC
  Administered 2019-12-19: 17:00:00 4 mg via INTRAMUSCULAR
  Filled 2019-12-19: qty 1

## 2019-12-19 MED ORDER — TRAMADOL HCL 50 MG PO TABS: 50.0000 mg | ORAL_TABLET | Freq: Four times a day (QID) | ORAL | 0 refills | Status: DC | PRN

## 2019-12-19 NOTE — ED Notes (Signed)
AVS reviewed with daughter in law. No further questions. Pt placed in car with assistance from NT.

## 2019-12-19 NOTE — ED Notes (Signed)
This RN provided a walker to pt.  Pt able to stand with this RN assistance and a walker at bedside.  Pt  unable to stand for longer than 1 minute. Pt st "my knees are giving out". EDP Siadecki at bedside at this time.  Pt denies Cp/SHOB/dizziness at this time.  Pt placed back on bed safely. This Rn will continue to monitor pt.

## 2019-12-19 NOTE — ED Notes (Signed)
This RN spoke with Claiborne Rigg to provide a status update.

## 2019-12-19 NOTE — Discharge Instructions (Addendum)
Return to the ER for new, worsening, or persistent severe knee pain, swelling, worsening weakness, recurrent falls or other injuries, or any other new or worsening symptoms that concern you.  You may take the tramadol as needed for pain. Follow-up with your regular doctor within the next 1 to 2 weeks.

## 2019-12-19 NOTE — ED Notes (Signed)
Blankets provided to pt.

## 2019-12-19 NOTE — ED Provider Notes (Addendum)
[facility] Emergency Department Provider Note ____________________________________________   First MD Initiated Contact with Patient 12/19/19 1606     (approximate)  I have reviewed the triage vital signs and the nursing notes.   HISTORY  Chief Complaint Fall    HPI Charles [name] is a 86 y.o. male with PMH as noted below who presents with right knee pain after he fell while getting into a vehicle today.  Patient states that he felt like his knee gave out and then he fell onto it.  He denies hitting his head or having LOC.  He was not feeling dizzy or weak.  He denies any other pain besides the right knee.  Past Medical History:  Diagnosis Date  . Abdominal aortic atherosclerosis ([place])    by xray  . Arthritis    in lower back (06/24/2015)  . Atrial flutter ([place])    notes 06/24/2015  . AV block, [place] 1 09/06/2017   Archie Endo 09/06/2017  . 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 CAD (coronary artery disease) 2004   a. s/p CABG in 2004 with LIMA-LAD, SVG-RCA, SVG-OM, and SVG-RI  . Carotid stenosis 1999   s/p L CEA  . Chronic diastolic CHF (congestive heart failure) ([place])   . Colon polyp 2005   (Dr. Tiffany Kocher)  . Compression fracture of L1 lumbar vertebra (HCC) remote  . COPD (chronic obstructive pulmonary disease) ([place]) 03/2011   by xray  . COVID-19 09/06/2019  . Depression    hx  . GERD (gastroesophageal reflux disease) 2003   h/o duodenal ulcer per EGD as well as esophagitis  . History of colon polyps 2003, 2005   adenomatous Vira Agar)  . History of tobacco abuse   . HLD (hyperlipidemia)   . HTN (hypertension)   . Pneumonia due to COVID-19 virus 09/07/2019   Hospitalization 09/2019  . Presence of permanent cardiac pacemaker   . Psoriasis   . Renal artery stenosis ([place]) 2004   70% bilateral, followed by cards  . 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 Severe aortic stenosis   . T2DM (type 2 diabetes mellitus) ([place]) 1995  . Urge incontinence of urine     Patient Active Problem List   Diagnosis Date Noted  . Centrilobular emphysema ([place]) 10/29/2019  . Acute on chronic diastolic (congestive) heart failure ([place]) 10/08/2019  . Hypotension 10/04/2019  . Protein-calorie malnutrition, moderate ([place]) 10/04/2019  . DNR (do not resuscitate) present on admission 09/08/2019  . Pneumonia due to COVID-19 virus 09/07/2019  . Chronic respiratory failure ([place]) 07/19/2019  . CKD stage 3 due to type 2 diabetes mellitus ([place]) 12/27/2018  . S/P TAVR (transcatheter aortic valve replacement)   . S/P placement of cardiac pacemaker  04/22/2018  . Paroxysmal atrial flutter ([place])   . Scrotal swelling 02/16/2018  . Coronary artery disease involving native coronary artery of native heart without angina pectoris   . Chronic heart failure with preserved ejection fraction (HFpEF) ([place]) 09/06/2017  . BPH (benign prostatic hyperplasia) 07/01/2017  . Chronic anticoagulation 11/05/2016  . Chronic radicular pain of lower back 02/17/2016  . Aortic stenosis, severe 06/04/2014  . DDD (degenerative disc disease), lumbar 10/02/2013  . Ex-smoker   . Carotid arterial disease ([place]) 08/23/2013  . MDD (major depressive disorder), recurrent episode, moderate ([place]) 09/21/2007  . Osteoarthritis 09/21/2007  . COLONIC  POLYPS 01/31/2007  . Diabetes mellitus type 2, uncontrolled, with complications ([place]) 99991111  . HYPERCHOLESTEROLEMIA 01/31/2007  . ERECTILE DYSFUNCTION 01/31/2007  . Essential hypertension 01/31/2007  . GERD 01/31/2007  . PSORIASIS 01/31/2007    Past Surgical History:  Procedure Laterality Date  . CARDIAC CATHETERIZATION  03/06/2003   No intervention - recommend CABG  . CARDIOVASCULAR STRESS TEST  11/2010   normal perfusion, no evidence of ischemia, EF 62% post exercise  . CARDIOVASCULAR STRESS TEST   11/28/2012   Mild diaphragmatic attenuation; cannot exclude a focal region of nontransmural inferior scar  . CARDIOVERSION N/A 06/25/2015   Procedure: CARDIOVERSION;  Surgeon: Pixie Casino, MD;  Location[facility] END[facility]COPY;  Service: Cardiovascular;  Laterality: N/A;  . CARDIOVERSION N/A 11/02/2016   Procedure: CARDIOVERSION;  Surgeon: [name], MD;  Location: [employer];  Service: Cardiovascular;  Laterality: N/A;  . CARDIOVERSION N/A 03/20/2019   Procedure: CARDIOVERSION;  Surgeon: Jerline Pain, MD;  Location: [place];  Service: Cardiovascular;  Laterality: N/A;  . CAROTID ENDARTERECTOMY Left 1999   ([place])  . CATARACT EXTRACTION W/ INTRAOCULAR LENS  IMPLANT, BILATERAL Bilateral 01/2013   [place]  . COLONOSCOPY  2003   colon polyp x3 - adenomatous Tiffany Kocher)  . COLONOSCOPY  10/08/2012   2 TA, diverticulosis, int hem, no rpt rec Tiffany Kocher)  . CORONARY ANGIOPLASTY    . CORONARY ARTERY BYPASS GRAFT  03/07/2003   4v CABG ([place]) with LIMA to LAD, vein graft to RCA, 1st obtuse marginal, and ramus intermedius  . ESOPHAGOGASTRODUODENOSCOPY  10/08/2012   nl esophagus, duodenitis and erosive gastropathy, path - gastropathy no Hpylori, no rpt rec  . LUMBAR EPIDURAL INJECTION  03/2017   L5/S1 ([place])  . PACEMAKER IMPLANT N/A 04/21/2018   Procedure: PACEMAKER IMPLANT -- Dual Chamber;  Surgeon: Deboraha Sprang, MD;  Location: [place] CV LAB;  Service: Cardiovascular;  Laterality: N/A;  . PERIPHERAL VASCULAR CATHETERIZATION N/A 02/02/2016   Procedure: Renal Angiography;  Surgeon: [name], MD;  Location: [place] CV LAB;  Service: Cardiovascular;  Laterality: N/A;  . PERIPHERAL VASCULAR CATHETERIZATION N/A 02/02/2016   Procedure: Abdominal Aortogram;  Surgeon: [name], MD;  Location: [place] CV LAB;  Service: Cardiovascular;  Laterality: N/A;  . RENAL DOPPLER  11/29/2011   Celiac&SMA-demonstrated vessel narrowing suggestive of a greater than 50% diameter reduction. Bilateral  renal arteries-demonstrated vessel narrowing of 60-99% diameter reduction. Rt Kidney-mid pole lateral simple cyst noted measuring 1.29x0.76x1.11cm and exophytic cyst outside lower pole measuring 1.23x0.96x1.31. Lft Kidney-lateral mid to lower pole simple cyst measuering-1.24x9.83x1.24  . RIGHT/LEFT HEART CATH AND CORONARY/GRAFT ANGIOGRAPHY N/A 10/20/2017   Procedure: RIGHT/LEFT HEART CATH AND CORONARY/GRAFT ANGIOGRAPHY;  Surgeon: Burnell Blanks, MD;  Location: [place] CV LAB;  Service: Cardiovascular;  Laterality: N/A;  . TEE WITHOUT CARDIOVERSION N/A 06/25/2015   Procedure: TRANSESOPHAGEAL ECHOCARDIOGRAM (TEE);  Surgeon: Pixie Casino, MD;  Location: [facility] ENDOSCOPY;  Service: Cardiovascular;  Laterality: N/A;  . TEE WITHOUT CARDIOVERSION N/A 11/02/2016   Procedure: TRANSESOPHAGEAL ECHOCARDIOGRAM (TEE);  Surgeon: [name], MD;  Location: [place];  Service: Cardiovascular;  Laterality: N/A;  . TEE WITHOUT CARDIOVERSION N/A 12/05/2018   Procedure: TRANSESOPHAGEAL ECHOCARDIOGRAM (TEE);  Surgeon: Sherren Mocha, MD;  Location: [place];  Service: Open Heart Surgery;  Laterality: N/A;  . TONSILLECTOMY    . TRANSCATHETER AORTIC VALVE REPLACEMENT, TRANSFEMORAL N/A 12/05/2018   Procedure: TRANSCATHETER AORTIC VALVE REPLACEMENT, TRANSFEMORAL;  Surgeon: Sherren Mocha, MD;  Location: [place];  Service: Open Heart Surgery;  Laterality:  N/A;  . UPPER GASTROINTESTINAL ENDOSCOPY  2003   reflux esophagitis, erosive gastropathy, duodenal ulcer    Prior to Admission medications   Medication Sig Start Date End Date Taking? Authorizing Provider  acetaminophen (TYLENOL) 500 MG tablet Take 1 tablet (500 mg total) by mouth 3 (three) times daily. 02/17/16   [name], MD  amiodarone (PACERONE) 200 MG tablet Take 1 tablet (200 mg total) by mouth daily. 08/21/19   Eugenie Filler, MD  apixaban (ELIQUIS) 2.5 MG TABS tablet Take 1 tablet (2.5 mg total) by mouth 2 (two) times daily. 06/29/19    [name], MD  Ascorbic Acid (VITAMIN C PO) Take 1 tablet by mouth daily.    [provider]  atorvastatin (LIPITOR) 20 MG tablet Take 1 tablet (20 mg total) by mouth daily. 11/21/19   [name], MD  BD INSULIN SYRINGE U/F 30G X 1/2" 0.5 ML MISC AS DIRECTED. 03/06/19   [name], MD  budesonide-formoterol [facility]) 160-4.5 MCG/ACT inhaler Inhale 2 puffs into the lungs 2 (two) times daily. 08/20/19   Eugenie Filler, MD  citalopram (CELEXA) 20 MG tablet Take 1 tablet (20 mg total) by mouth daily. 07/16/19   [name], MD  finasteride (PROSCAR) 5 MG tablet Take 1 tablet (5 mg total) by mouth daily. 04/03/19   [name], MD  glucose blood (ACCU-CHEK AVIVA PLUS) test strip Use as instructed to check blood sugar three times a day. Dx code E11.8 & E11.65 04/10/19   [name], MD  guaiFENesin (MUCINEX) 600 MG 12 hr tablet Take 600 mg by mouth 2 (two) times daily as needed for cough or to loosen phlegm.    [provider]  insulin aspart (NOVOLOG) 100 UNIT/ML injection Inject 6 Units into the skin 3 (three) times daily with meals for 6 days. 09/13/19 11/12/19  Bonnielee Haff, MD  insulin glargine (LANTUS) 100 UNIT/ML injection Lantus 22 units in the morning and 20 units at bedtime. 08/20/19   Eugenie Filler, MD  lisinopril (ZESTRIL) 10 MG tablet Take 1 tablet (10 mg total) by mouth daily. 10/02/19 11/12/19  [name], MD  Magnesium Oxide (MAG-OXIDE) 200 MG TABS Take 1 tablet (200 mg total) by mouth daily. 11/12/19   Clegg, Amy D, NP  metolazone (ZAROXOLYN) 5 MG tablet Take 1 tablet (5 mg total) by mouth 2 (two) times a week. 11/12/19   Clegg, Amy D, NP  nitroGLYCERIN (NITROSTAT) 0.4 MG SL tablet Place 1 tablet (0.4 mg total) under the tongue every 5 (five) minutes as needed for chest pain. 03/08/18   Almyra Deforest, PA  pantoprazole (PROTONIX) 40 MG tablet Take 1 tablet (40 mg total) by mouth daily. Take 1 tablet (20 mg total) by mouth daily.  08/20/19   Eugenie Filler, MD  potassium chloride SA (KLOR-CON) 20 MEQ tablet Take 1 tablet (20 mEq total) every Monday, Wednesday, Friday, Saturday, and Sunday. Take an additional 74meq with Metolazone. 10/30/19   Clegg, Amy D, NP  torsemide (DEMADEX) 20 MG tablet Take 3 tablets (60 mg total) by mouth 3 (three) times daily. TEMPORARY INCREASE IN DOSE 10/23/19   Tonia Ghent, MD  traMADol (ULTRAM) 50 MG tablet Take 1 tablet (50 mg total) by mouth every 6 (six) hours as needed for up to 7 days. 12/19/19 12/26/19  Arta Silence, MD  traZODone (DESYREL) 50 MG tablet Take 1 tablet (50 mg total) by mouth at bedtime. 08/20/19   Eugenie Filler, MD    Allergies Gabapentin, Metoprolol, Spironolactone,  Amlodipine, Other, Rosiglitazone maleate, and Tricor [fenofibrate]  Family History  Problem Relation Age of Onset  . Hypertension Father   . Heart disease Father   . Cancer Mother        GI cancer  . Diabetes Sister   . Heart disease Sister   . Stroke Sister   . Hypertension Sister   . Cancer Brother        Lung cancer - nonsmoker  . Cancer Maternal Grandmother   . Heart attack Maternal Grandfather   . Heart attack Paternal Grandmother   . Heart attack Paternal Grandfather   . Cancer Brother        Pancreatic cancer  . Lung disease Brother     Social History Social History   Tobacco Use  . Smoking status: Former Smoker    Packs/day: 0.50    Years: 36.00    Pack years: 18.00    Types: Cigarettes    Quit date: 07/23/2013    Years since quitting: 6.4  . Smokeless tobacco: Never Used  Substance Use Topics  . Alcohol use: No  . Drug use: No    Review of Systems  Constitutional: No fever. Eyes: No redness. ENT: No neck pain. Cardiovascular: Denies chest pain. Respiratory: Denies shortness of breath. Gastrointestinal: No abdominal pain. Genitourinary: Negative for flank pain.  Musculoskeletal: Negative for back or hip pain.  Positive for right knee pain. Skin:  Negative for rash. Neurological: Negative for headaches, focal weakness or numbness.   ____________________________________________   PHYSICAL EXAM:  VITAL SIGNS: ED Triage Vitals  Enc Vitals Group     BP 12/19/19 1547 123/61     Pulse Rate 12/19/19 1547 85     Resp 12/19/19 1547 17     Temp 12/19/19 1557 97.9 F (36.6 C)     Temp Source 12/19/19 1557 Oral     SpO2 12/19/19 1547 100 %     Weight 12/19/19 1548 235 lb (106.6 kg)     Height 12/19/19 1548 6\' 1"  (1.854 m)     Head Circumference --      Peak Flow --      Pain Score 12/19/19 1548 10     Pain Loc --      Pain Edu? --      Excl. in [place]? --     Constitutional: Alert and oriented. Well appearing for age and in no acute distress. Eyes: Conjunctivae are normal.  Head: Atraumatic. Nose: No congestion/rhinnorhea. Mouth/Throat: Mucous membranes are moist.   Neck: Normal range of motion.  Cardiovascular: Normal rate, regular rhythm.  Good peripheral circulation. Respiratory: Normal respiratory effort.  No retractions.  Gastrointestinal: No distention.  Musculoskeletal: No lower extremity edema.  Extremities warm and well perfused.  Pain to palpation of right knee with no deformity.  Pain on range of motion, but the patient is able to bend approximately 45 degrees and hold the knee in extension.  No tenderness to the right hip.  2+ DP pulse. Neurologic:  Normal speech and language.  Motor and sensory intact in all extremities.   Skin:  Skin is warm and dry. No rash noted. Psychiatric: Mood and affect are normal. Speech and behavior are normal.  ____________________________________________   LABS (all labs ordered are listed, but only abnormal results are displayed)  Labs Reviewed  COMPREHENSIVE METABOLIC PANEL - Abnormal; Notable for the following components:      Result Value   Chloride 94 (*)    Glucose, Bld 300 (*)  BUN 44 (*)    Creatinine, Ser 1.96 (*)    GFR calc non Af Amer 30 (*)    GFR calc Af Amer 35  (*)    All other components within normal limits  CBC WITH DIFFERENTIAL/PLATELET - Abnormal; Notable for the following components:   RBC 3.44 (*)    Hemoglobin 10.1 (*)    HCT 31.8 (*)    Abs Immature Granulocytes 0.08 (*)    All other components within normal limits  BRAIN NATRIURETIC PEPTIDE - Abnormal; Notable for the following components:   B Natriuretic Peptide 248.0 (*)    All other components within normal limits  TROPONIN I (HIGH SENSITIVITY) - Abnormal; Notable for the following components:   Troponin I (High Sensitivity) 54 (*)    All other components within normal limits  URINALYSIS, COMPLETE (UACMP) WITH MICROSCOPIC  TROPONIN I (HIGH SENSITIVITY)   ____________________________________________  EKG  ED ECG REPORT I, Arta Silence, the attending physician, personally viewed and interpreted this ECG.  Date: 12/19/2019 EKG Time: 1831 Rate: 81 Rhythm: Ventricular paced rhythm Narrative Interpretation: no evidence of acute ischemia  ____________________________________________  RADIOLOGY  XR R knee: Small suprapatellar effusion with no acute fracture.  ____________________________________________   PROCEDURES  Procedure(s) performed: No  Procedures  Critical Care performed: No ____________________________________________   INITIAL IMPRESSION / ASSESSMENT AND PLAN / ED COURSE  Pertinent labs & imaging results that were available during my care of the patient were reviewed by me and considered in my medical decision making (see chart for details).  86 year old male with PMH as noted above presents with right knee pain after a mechanical fall from standing height.  The patient denies other injuries.  He had no weakness or near syncope preceding the fall.  On exam, the patient is relatively comfortable appearing.  He has pain on range of motion of the right knee, but does not appear to have any right hip or ankle pain and he is able to move the right  knee.  There is no deformity.  Differential includes fracture versus contusion of the right knee.  There is no evidence of injury to the hip.  We will obtain x-rays and reassess.  ----------------------------------------- 5:37 PM on 12/19/2019 -----------------------------------------  The x-ray shows no acute fracture.  The patient is feeling better after IM morphine.  We will attempt a trial of ambulation with a walker and reassess.  ----------------------------------------- 7:08 PM on 12/19/2019 -----------------------------------------  The patient was able to stand and bear weight on the right leg, but is not really able to ambulate.  He appears weak overall.  However, he states he very much wants to go home.  I called the daughter-in-law who helps take care of the patient.  She states that he has been significantly weaker than normal over the last 3 to 4 days and has actually had several falls at home due to this.  Based on this discussion with her, I elected to obtain lab work-up to evaluate for causes of generalized weakness.  The patient agrees with this plan although he is adamant that he would still like to go home once the work-up is complete.  The daughter-in-law states that if the patient is stable she feels comfortable taking care of him at home in his current state.  ----------------------------------------- 7:42 PM on 12/19/2019 -----------------------------------------  Lab work-up reveals no significant acute findings. The creatinine is similar to prior. The patient's troponin is chronically elevated around 50. His BNP is also not significantly elevated.  The patient states that he wants to go home now. He denies any urinary symptoms, so I think it is reasonable to forego the urinalysis that is still pending. I counseled him on the results of the work-up. I gave him very thorough return precautions and he expressed  understanding.  ____________________________________________   FINAL CLINICAL IMPRESSION(S) / ED DIAGNOSES  Final diagnoses:  Contusion of right knee, initial encounter  Weakness      NEW MEDICATIONS STARTED DURING THIS VISIT:  Discharge Medication List as of 12/19/2019  7:42 PM    START taking these medications   Details  traMADol (ULTRAM) 50 MG tablet Take 1 tablet (50 mg total) by mouth every 6 (six) hours as needed for up to 7 days., Starting Wed 12/19/2019, Until Wed 12/26/2019, Normal         Note:  This document was prepared using Dragon voice recognition software and may include unintentional dictation errors.    Arta Silence, MD 12/19/19 Raye Sorrow    Arta Silence, MD 12/19/19 2034

## 2019-12-19 NOTE — Telephone Encounter (Signed)
Referral placed to [facility] for hospital bed, order printed and in Lisa's box. plz send to [facility] agency for assistance.

## 2019-12-19 NOTE — ED Triage Notes (Addendum)
Pt from home via [facility] presents to ED for a mechanical fall witnessed by son. Per EMS, pt st walking with a walker to his car when "my left knee gave out" fell on right side, c/o r/knee pain this time. Denies hitting his head. HOH

## 2019-12-19 NOTE — Telephone Encounter (Addendum)
Patient's daughter-in-law (on [place])  called stating that she had sent a mychart message yesterday and wanted to know when his doctor would see it?  [name] stated that the patient fell again this morning when trying to get into her car for a doctor's appointment. [name] stated that he injured his knee. [name] stated that later on he was sitting on his walker and was not able to move his legs. [name] stated that he has fallen several times today. [name] stated that they called EMS and they left with the patient about 20 minutes ago to take him to the ER because of his knee. [name] stated that she is not sure which ER he was being taken to. [name] stated that her father-in-law is going to need a hospital bed because he is having a lot of problems getting in and out of his bed. [name] wants to know if Dr. [name] can help them get him a hospital bed?

## 2019-12-20 NOTE — Telephone Encounter (Signed)
Faxed order to [facility] at [phone].  Rosaria Ferries spoke with Corene Cornea of [facility].  He says pt/family will need to bring printed order to: [place], [place]  ([place])  Left message on vm for [name], per dpr, informing her a printed order is ready to pick up from our office.  Also, left info where order needs to be taken to in [place].  I sent this info via MyChart also.

## 2019-12-21 ENCOUNTER — Telehealth: Payer: Medicare Other

## 2019-12-21 ENCOUNTER — Encounter: Payer: Self-pay | Admitting: Family Medicine

## 2019-12-21 NOTE — Telephone Encounter (Signed)
Charles [name] called asking for recent OV notes pertaining to the hospital bed. I advised her there were no notes besides the ER note and the family sending a MyChart message requesting it. She is going to contact the family to call and schedule a Virtual Visit to do a Face to Face for the hospital bed. Proper documentation has to be done for this to be covered. Asked that once he is seen and the note is complete, to fax it to [phone]

## 2019-12-21 NOTE — Telephone Encounter (Signed)
See mychart request. Rx for bedside commode placed in Lisa's box.

## 2019-12-21 NOTE — Telephone Encounter (Signed)
Please schedule virtual visit for Monday at 12:30pm instead of Tuesday 12:00pm

## 2019-12-24 ENCOUNTER — Other Ambulatory Visit: Payer: Self-pay

## 2019-12-24 ENCOUNTER — Encounter: Payer: Self-pay | Admitting: Family Medicine

## 2019-12-24 ENCOUNTER — Ambulatory Visit (INDEPENDENT_AMBULATORY_CARE_PROVIDER_SITE_OTHER): Payer: Medicare Other | Admitting: Family Medicine

## 2019-12-24 VITALS — BP 160/80 | HR 92 | Ht 73.0 in

## 2019-12-24 DIAGNOSIS — N183 Chronic kidney disease, stage 3 unspecified: Secondary | ICD-10-CM

## 2019-12-24 DIAGNOSIS — R531 Weakness: Secondary | ICD-10-CM | POA: Diagnosis not present

## 2019-12-24 DIAGNOSIS — I10 Essential (primary) hypertension: Secondary | ICD-10-CM

## 2019-12-24 DIAGNOSIS — M159 Polyosteoarthritis, unspecified: Secondary | ICD-10-CM

## 2019-12-24 DIAGNOSIS — M5136 Other intervertebral disc degeneration, lumbar region: Secondary | ICD-10-CM

## 2019-12-24 DIAGNOSIS — M8949 Other hypertrophic osteoarthropathy, multiple sites: Secondary | ICD-10-CM

## 2019-12-24 DIAGNOSIS — Z66 Do not resuscitate: Secondary | ICD-10-CM

## 2019-12-24 DIAGNOSIS — IMO0002 Reserved for concepts with insufficient information to code with codable children: Secondary | ICD-10-CM

## 2019-12-24 DIAGNOSIS — I5032 Chronic diastolic (congestive) heart failure: Secondary | ICD-10-CM

## 2019-12-24 DIAGNOSIS — M79604 Pain in right leg: Secondary | ICD-10-CM

## 2019-12-24 DIAGNOSIS — E118 Type 2 diabetes mellitus with unspecified complications: Secondary | ICD-10-CM | POA: Diagnosis not present

## 2019-12-24 DIAGNOSIS — J9610 Chronic respiratory failure, unspecified whether with hypoxia or hypercapnia: Secondary | ICD-10-CM

## 2019-12-24 DIAGNOSIS — I2510 Atherosclerotic heart disease of native coronary artery without angina pectoris: Secondary | ICD-10-CM

## 2019-12-24 DIAGNOSIS — E440 Moderate protein-calorie malnutrition: Secondary | ICD-10-CM

## 2019-12-24 DIAGNOSIS — E1165 Type 2 diabetes mellitus with hyperglycemia: Secondary | ICD-10-CM

## 2019-12-24 DIAGNOSIS — E1122 Type 2 diabetes mellitus with diabetic chronic kidney disease: Secondary | ICD-10-CM

## 2019-12-24 NOTE — Telephone Encounter (Signed)
Appointment r/s tara aware

## 2019-12-24 NOTE — Progress Notes (Signed)
Virtual visit completed through [facility] [place]. Due to national recommendations of social distancing due to COVID-19, a virtual visit is felt to be most appropriate for this patient at this time. Reviewed limitations of a virtual visit.   Patient location: home. Tara DIL and son are also on call.  Provider location: Financial controller at [facility], office If any vitals were documented, they were collected by patient at home unless specified below.    BP (!) 160/80   Pulse 92   Ht 6\' 1"  (1.854 m)   SpO2 95% Comment: 3L [place]  BMI 31.00 kg/m     CC: ER f/u visit Subjective:    Patient ID: Charles [name], male    DOB: Jan 05, 1932, 86 y.o.   MRN: [id number]  HPI: SERGIO BEAVER is a 86 y.o. male presenting on 12/24/2019 for Hospitalization Follow-up (Seen at [facility] ED on 12/19/19.  Pt's family requesting hospital bed. )   On call with DIL niece [name].   Virtual visit scheduled today after recent ER visit for fall. [place] while trying to get into car with DIL to go to doctor's office. Injured R leg - ER note reviewed - R knee xrays were reassuringly without fracture, just suprapatellar knee effusion as well as moderate diffuse R knee arthritis.   Also notes sore bottom especially when he's taking a BM.   Since home, unable to bear weight on leg. Pain seems to be improving but staying very weak. Much weaker recently - after latest fall. Stable chronic dyspnea. Denies chest pain or tightness. Denies dizziness. No cough or worsening leg swelling. He is taking metolazone once a week on Friday. Taking lantus 20u BID. Fasting sugar this morning 222, yesterday morning 172. Diabetes regimen was changed at [facility] during Covid stay. Back on metformin 500mg  bid - med list was updated.  Lab Results  Component Value Date   HGBA1C 9.3 09/21/2019     Never regained strength to previous baseline after covid. Has been weaker ever since. Did have temporary [place] therapy - this ended 10/2019. Has not been doing home  exercises.   He is living alone at home but has family who checks in on him several times every day.   They need face to face to certify hospital bed.  They also requested a Rx for bedside commode.   Due to generalized weakness, CHF, and R leg pain, patient requires frequent changes in body position. His bed is too high. He is unable to get into his bed. Has been living in his recliner as he's too weak to get up by himself. He is unable to lay flat due to CHF history. The patient has a medical condition which requires positioning of the body in ways not feasible with an ordinary bed and the patient requires the head of the bed to be elevated more than 30 degrees most of the time due to CHF.       Relevant past medical, surgical, family and social history reviewed and updated as indicated. Interim medical history since our last visit reviewed. Allergies and medications reviewed and updated. Outpatient Medications Prior to Visit  Medication Sig Dispense Refill  . acetaminophen (TYLENOL) 500 MG tablet Take 1 tablet (500 mg total) by mouth 3 (three) times daily.    [name] amiodarone (PACERONE) 200 MG tablet Take 1 tablet (200 mg total) by mouth daily. 30 tablet 1  . apixaban (ELIQUIS) 2.5 MG TABS tablet Take 1 tablet (2.5 mg total) by mouth 2 (two)  times daily. 60 tablet 3  . Ascorbic Acid (VITAMIN C PO) Take 1 tablet by mouth daily.    [name] atorvastatin (LIPITOR) 20 MG tablet Take 1 tablet (20 mg total) by mouth daily. 90 tablet 1  . BD INSULIN SYRINGE U/F 30G X 1/2" 0.5 ML MISC AS DIRECTED. 100 each 0  . budesonide-formoterol (SYMBICORT) 160-4.5 MCG/ACT inhaler Inhale 2 puffs into the lungs 2 (two) times daily. 1 Inhaler 2  . citalopram (CELEXA) 20 MG tablet Take 1 tablet (20 mg total) by mouth daily. 90 tablet 2  . finasteride (PROSCAR) 5 MG tablet Take 1 tablet (5 mg total) by mouth daily. 30 tablet 5  . glucose blood (ACCU-CHEK AVIVA PLUS) test strip Use as instructed to check blood sugar three  times a day. Dx code E11.8 & E11.65 300 strip 1  . guaiFENesin (MUCINEX) 600 MG 12 hr tablet Take 600 mg by mouth 2 (two) times daily as needed for cough or to loosen phlegm.    . insulin glargine (LANTUS) 100 UNIT/ML injection Lantus 22 units in the morning and 20 units at bedtime. 30 mL 0  . lisinopril (ZESTRIL) 10 MG tablet Take 1 tablet (10 mg total) by mouth daily. 30 tablet 6  . Magnesium Oxide (MAG-OXIDE) 200 MG TABS Take 1 tablet (200 mg total) by mouth daily. 30 tablet 3  . metolazone (ZAROXOLYN) 5 MG tablet Take 1 tablet (5 mg total) by mouth once a week.    . nitroGLYCERIN (NITROSTAT) 0.4 MG SL tablet Place 1 tablet (0.4 mg total) under the tongue every 5 (five) minutes as needed for chest pain. 90 tablet 3  . pantoprazole (PROTONIX) 40 MG tablet Take 1 tablet (40 mg total) by mouth daily. Take 1 tablet (20 mg total) by mouth daily. 30 tablet 1  . potassium chloride SA (KLOR-CON) 20 MEQ tablet Take 1 tablet (20 mEq total) every Monday, Wednesday, Friday, Saturday, and Sunday. Take an additional 34meq with Metolazone. 30 tablet 11  . torsemide (DEMADEX) 20 MG tablet Take 3 tablets (60 mg total) by mouth 3 (three) times daily. TEMPORARY INCREASE IN DOSE    . traMADol (ULTRAM) 50 MG tablet Take 1 tablet (50 mg total) by mouth every 6 (six) hours as needed for up to 7 days. 20 tablet 0  . traZODone (DESYREL) 50 MG tablet Take 1 tablet (50 mg total) by mouth at bedtime. 30 tablet 0  . metolazone (ZAROXOLYN) 5 MG tablet Take 1 tablet (5 mg total) by mouth 2 (two) times a week. 15 tablet 2  . metFORMIN (GLUCOPHAGE) 500 MG tablet Take 1 tablet (500 mg total) by mouth 2 (two) times daily with a meal.    . insulin aspart (NOVOLOG) 100 UNIT/ML injection Inject 6 Units into the skin 3 (three) times daily with meals for 6 days.     No facility-administered medications prior to visit.     Per HPI unless specifically indicated in ROS section below Review of Systems Objective:    BP (!) 160/80    Pulse 92   Ht 6\' 1"  (1.854 m)   SpO2 95% Comment: 3L [place]  BMI 31.00 kg/m   Wt Readings from Last 3 Encounters:  12/19/19 235 lb (106.6 kg)  11/12/19 227 lb 3.2 oz (103.1 kg)  10/29/19 226 lb 6.4 oz (102.7 kg)     Physical exam: Gen: alert, NAD, not ill appearing, sitting in recliner Pulm: speaks in complete sentences without increased work of breathing Psych: normal mood, normal thought  content      Results for orders placed or performed during the hospital encounter of 12/19/19  Comprehensive metabolic panel  Result Value Ref Range   Sodium 136 135 - 145 mmol/L   Potassium 4.4 3.5 - 5.1 mmol/L   Chloride 94 (L) 98 - 111 mmol/L   CO2 31 22 - 32 mmol/L   Glucose, Bld 300 (H) 70 - 99 mg/dL   BUN 44 (H) 8 - 23 mg/dL   Creatinine, Ser 1.96 (H) 0.61 - 1.24 mg/dL   Calcium 9.2 8.9 - 10.3 mg/dL   Total Protein 7.5 6.5 - 8.1 g/dL   Albumin 3.9 3.5 - 5.0 g/dL   AST 19 15 - 41 U/L   ALT 15 0 - 44 U/L   Alkaline Phosphatase 76 38 - 126 U/L   Total Bilirubin 0.6 0.3 - 1.2 mg/dL   GFR calc non Af Amer 30 (L) >60 mL/min   GFR calc Af Amer 35 (L) >60 mL/min   Anion gap 11 5 - 15  CBC with Differential  Result Value Ref Range   WBC 8.2 4.0 - 10.5 K/uL   RBC 3.44 (L) 4.22 - 5.81 MIL/uL   Hemoglobin 10.1 (L) 13.0 - 17.0 g/dL   HCT 31.8 (L) 39.0 - 52.0 %   MCV 92.4 80.0 - 100.0 fL   MCH 29.4 26.0 - 34.0 pg   MCHC 31.8 30.0 - 36.0 g/dL   RDW 13.7 11.5 - 15.5 %   Platelets 176 150 - 400 K/uL   nRBC 0.0 0.0 - 0.2 %   Neutrophils Relative % 72 %   Neutro Abs 6.0 1.7 - 7.7 K/uL   Lymphocytes Relative 13 %   Lymphs Abs 1.1 0.7 - 4.0 K/uL   Monocytes Relative 11 %   Monocytes Absolute 0.9 0.1 - 1.0 K/uL   Eosinophils Relative 2 %   Eosinophils Absolute 0.2 0.0 - 0.5 K/uL   Basophils Relative 1 %   Basophils Absolute 0.1 0.0 - 0.1 K/uL   Immature Granulocytes 1 %   Abs Immature Granulocytes 0.08 (H) 0.00 - 0.07 K/uL  Brain natriuretic peptide  Result Value Ref Range   B Natriuretic  Peptide 248.0 (H) 0.0 - 100.0 pg/mL  Troponin I (High Sensitivity)  Result Value Ref Range   Troponin I (High Sensitivity) 54 (H) <18 ng/L   Assessment & Plan:   Problem List Items Addressed This Visit    Right leg pain    After fall - ER eval reassuring. Ongoing weakness. I will ask [facility] to come out for assessment and work towards fall prevention. I advised in-office eval if ongoing R leg trouble.       Relevant Orders   Ambulatory referral to Home Health   Protein-calorie malnutrition, moderate ([place])    Recent albumin low. Discussed importance of nutritious diet, encouraged protein with each meal and 3 meals a day.      Relevant Orders   Amb Referral to Palliative Care   Osteoarthritis   General weakness - Primary    Worse weakness since COVID pneumonia 09/2019, never fully returned to baseline after this illness.  Now more weak and unable to get into his high bed or out of recliner on his own. Has had several falls at home. Will ask [facility] for evaluation as well as refer to palliative care team for home evaluation. He desires DNR.       Relevant Orders   Amb Referral to Palliative Care  Ambulatory referral to [place] hypertension    BP elevated today. Encouraged more close monitoring of this.       Relevant Medications   metolazone (ZAROXOLYN) 5 MG tablet   DNR (do not resuscitate) present on admission    Will need to verify next visit.       Diabetes mellitus type 2, uncontrolled, with complications ([place])    Uncontrolled based on cbg's. Will slowly titrate up lantus by 1 unit Q3d if averaging >150 to 25u bid.      Relevant Medications   metFORMIN (GLUCOPHAGE) 500 MG tablet   Other Relevant Orders   Ambulatory referral to [facility]   DDD (degenerative disc disease), lumbar   Coronary artery disease involving native coronary artery of native heart without angina pectoris   Relevant Medications   metolazone (ZAROXOLYN) 5 MG tablet   CKD stage 3 due  to type 2 diabetes mellitus (HCC)   Relevant Medications   metFORMIN (GLUCOPHAGE) 500 MG tablet   Chronic respiratory failure (HCC)    Continues oxygen.       Relevant Orders   Amb Referral to Palliative Care   Chronic heart failure with preserved ejection fraction (HFpEF) (HCC)    BNP, troponin at ER elevated but seem stable. He doesn't describe CHF exacerbation symptoms. Continues torsemide with once weekly metolazone.       Relevant Medications   metolazone (ZAROXOLYN) 5 MG tablet   Other Relevant Orders   Amb Referral to Palliative Care   Ambulatory referral to Home Health       No orders of the defined types were placed in this encounter.  Orders Placed This Encounter  Procedures  . Amb Referral to Palliative Care    Referral Priority:   Routine    Referral Type:   Consultation    Number of Visits Requested:   1  . Ambulatory referral to Home Health    Referral Priority:   Routine    Referral Type:   Home Health Care    Referral Reason:   Specialty Services Required    Requested Specialty:   [place]    Number of Visits Requested:   1    I discussed the assessment and treatment plan with the patient. The patient was provided an opportunity to ask questions and all were answered. The patient agreed with the plan and demonstrated an understanding of the instructions. The patient was advised to call back or seek an in-person evaluation if the symptoms worsen or if the condition fails to improve as anticipated.  There are no Patient Instructions on file for this visit.  Follow up plan: No follow-ups on file.  [name], MD

## 2019-12-24 NOTE — Telephone Encounter (Addendum)
Placed written order at front office- yellow folders.   Pt scheduled for virtual visit at 12:30 today.

## 2019-12-25 ENCOUNTER — Ambulatory Visit: Payer: Medicare Other | Admitting: Family Medicine

## 2019-12-25 ENCOUNTER — Inpatient Hospital Stay
Admission: EM | Admit: 2019-12-25 | Discharge: 2020-01-01 | DRG: 292 | Disposition: A | Discharge status: Skilled Nursing Facility | Payer: Medicare Other | Attending: Internal Medicine | Admitting: Internal Medicine

## 2019-12-25 ENCOUNTER — Telehealth: Payer: Self-pay | Admitting: Family Medicine

## 2019-12-25 ENCOUNTER — Emergency Department: Payer: Medicare Other

## 2019-12-25 ENCOUNTER — Other Ambulatory Visit: Payer: Self-pay

## 2019-12-25 ENCOUNTER — Encounter: Payer: Self-pay | Admitting: Emergency Medicine

## 2019-12-25 DIAGNOSIS — K5730 Diverticulosis of large intestine without perforation or abscess without bleeding: Secondary | ICD-10-CM | POA: Diagnosis present

## 2019-12-25 DIAGNOSIS — R498 Other voice and resonance disorders: Secondary | ICD-10-CM | POA: Diagnosis not present

## 2019-12-25 DIAGNOSIS — Z9842 Cataract extraction status, left eye: Secondary | ICD-10-CM

## 2019-12-25 DIAGNOSIS — L8990 Pressure ulcer of unspecified site, unspecified stage: Secondary | ICD-10-CM | POA: Diagnosis not present

## 2019-12-25 DIAGNOSIS — Z20822 Contact with and (suspected) exposure to covid-19: Secondary | ICD-10-CM | POA: Diagnosis not present

## 2019-12-25 DIAGNOSIS — Z9841 Cataract extraction status, right eye: Secondary | ICD-10-CM

## 2019-12-25 DIAGNOSIS — M1711 Unilateral primary osteoarthritis, right knee: Secondary | ICD-10-CM | POA: Diagnosis present

## 2019-12-25 DIAGNOSIS — L409 Psoriasis, unspecified: Secondary | ICD-10-CM | POA: Diagnosis present

## 2019-12-25 DIAGNOSIS — Z800 Family history of malignant neoplasm of digestive organs: Secondary | ICD-10-CM

## 2019-12-25 DIAGNOSIS — I5032 Chronic diastolic (congestive) heart failure: Secondary | ICD-10-CM | POA: Diagnosis present

## 2019-12-25 DIAGNOSIS — N179 Acute kidney failure, unspecified: Secondary | ICD-10-CM | POA: Diagnosis not present

## 2019-12-25 DIAGNOSIS — N189 Chronic kidney disease, unspecified: Secondary | ICD-10-CM

## 2019-12-25 DIAGNOSIS — I2510 Atherosclerotic heart disease of native coronary artery without angina pectoris: Secondary | ICD-10-CM | POA: Diagnosis present

## 2019-12-25 DIAGNOSIS — J9611 Chronic respiratory failure with hypoxia: Secondary | ICD-10-CM | POA: Diagnosis not present

## 2019-12-25 DIAGNOSIS — R531 Weakness: Secondary | ICD-10-CM

## 2019-12-25 DIAGNOSIS — Z743 Need for continuous supervision: Secondary | ICD-10-CM | POA: Diagnosis not present

## 2019-12-25 DIAGNOSIS — R404 Transient alteration of awareness: Secondary | ICD-10-CM | POA: Diagnosis not present

## 2019-12-25 DIAGNOSIS — Z833 Family history of diabetes mellitus: Secondary | ICD-10-CM

## 2019-12-25 DIAGNOSIS — Z515 Encounter for palliative care: Secondary | ICD-10-CM | POA: Diagnosis not present

## 2019-12-25 DIAGNOSIS — N1832 Chronic kidney disease, stage 3b: Secondary | ICD-10-CM | POA: Diagnosis present

## 2019-12-25 DIAGNOSIS — S0990XA Unspecified injury of head, initial encounter: Secondary | ICD-10-CM | POA: Diagnosis not present

## 2019-12-25 DIAGNOSIS — Z8719 Personal history of other diseases of the digestive system: Secondary | ICD-10-CM

## 2019-12-25 DIAGNOSIS — Z9981 Dependence on supplemental oxygen: Secondary | ICD-10-CM

## 2019-12-25 DIAGNOSIS — I4891 Unspecified atrial fibrillation: Secondary | ICD-10-CM | POA: Diagnosis not present

## 2019-12-25 DIAGNOSIS — Z951 Presence of aortocoronary bypass graft: Secondary | ICD-10-CM

## 2019-12-25 DIAGNOSIS — Z801 Family history of malignant neoplasm of trachea, bronchus and lung: Secondary | ICD-10-CM

## 2019-12-25 DIAGNOSIS — M810 Age-related osteoporosis without current pathological fracture: Secondary | ICD-10-CM | POA: Diagnosis present

## 2019-12-25 DIAGNOSIS — Z823 Family history of stroke: Secondary | ICD-10-CM

## 2019-12-25 DIAGNOSIS — E785 Hyperlipidemia, unspecified: Secondary | ICD-10-CM | POA: Diagnosis not present

## 2019-12-25 DIAGNOSIS — Z66 Do not resuscitate: Secondary | ICD-10-CM | POA: Diagnosis present

## 2019-12-25 DIAGNOSIS — E1122 Type 2 diabetes mellitus with diabetic chronic kidney disease: Secondary | ICD-10-CM | POA: Diagnosis not present

## 2019-12-25 DIAGNOSIS — I248 Other forms of acute ischemic heart disease: Secondary | ICD-10-CM | POA: Diagnosis not present

## 2019-12-25 DIAGNOSIS — I130 Hypertensive heart and chronic kidney disease with heart failure and stage 1 through stage 4 chronic kidney disease, or unspecified chronic kidney disease: Secondary | ICD-10-CM | POA: Diagnosis not present

## 2019-12-25 DIAGNOSIS — M6281 Muscle weakness (generalized): Secondary | ICD-10-CM | POA: Diagnosis not present

## 2019-12-25 DIAGNOSIS — R410 Disorientation, unspecified: Secondary | ICD-10-CM | POA: Diagnosis not present

## 2019-12-25 DIAGNOSIS — S299XXA Unspecified injury of thorax, initial encounter: Secondary | ICD-10-CM | POA: Diagnosis not present

## 2019-12-25 DIAGNOSIS — I4892 Unspecified atrial flutter: Secondary | ICD-10-CM | POA: Diagnosis present

## 2019-12-25 DIAGNOSIS — M25461 Effusion, right knee: Secondary | ICD-10-CM | POA: Diagnosis not present

## 2019-12-25 DIAGNOSIS — D649 Anemia, unspecified: Secondary | ICD-10-CM | POA: Diagnosis present

## 2019-12-25 DIAGNOSIS — J449 Chronic obstructive pulmonary disease, unspecified: Secondary | ICD-10-CM | POA: Diagnosis not present

## 2019-12-25 DIAGNOSIS — R262 Difficulty in walking, not elsewhere classified: Secondary | ICD-10-CM | POA: Diagnosis not present

## 2019-12-25 DIAGNOSIS — Z961 Presence of intraocular lens: Secondary | ICD-10-CM | POA: Diagnosis present

## 2019-12-25 DIAGNOSIS — Z8249 Family history of ischemic heart disease and other diseases of the circulatory system: Secondary | ICD-10-CM

## 2019-12-25 DIAGNOSIS — R778 Other specified abnormalities of plasma proteins: Secondary | ICD-10-CM

## 2019-12-25 DIAGNOSIS — S199XXA Unspecified injury of neck, initial encounter: Secondary | ICD-10-CM | POA: Diagnosis not present

## 2019-12-25 DIAGNOSIS — R279 Unspecified lack of coordination: Secondary | ICD-10-CM | POA: Diagnosis not present

## 2019-12-25 DIAGNOSIS — N183 Chronic kidney disease, stage 3 unspecified: Secondary | ICD-10-CM | POA: Diagnosis not present

## 2019-12-25 DIAGNOSIS — N401 Enlarged prostate with lower urinary tract symptoms: Secondary | ICD-10-CM | POA: Diagnosis present

## 2019-12-25 DIAGNOSIS — Z953 Presence of xenogenic heart valve: Secondary | ICD-10-CM

## 2019-12-25 DIAGNOSIS — J441 Chronic obstructive pulmonary disease with (acute) exacerbation: Secondary | ICD-10-CM | POA: Diagnosis not present

## 2019-12-25 DIAGNOSIS — I480 Paroxysmal atrial fibrillation: Secondary | ICD-10-CM | POA: Diagnosis present

## 2019-12-25 DIAGNOSIS — N3941 Urge incontinence: Secondary | ICD-10-CM | POA: Diagnosis present

## 2019-12-25 DIAGNOSIS — E1165 Type 2 diabetes mellitus with hyperglycemia: Secondary | ICD-10-CM | POA: Diagnosis present

## 2019-12-25 DIAGNOSIS — I5033 Acute on chronic diastolic (congestive) heart failure: Secondary | ICD-10-CM | POA: Diagnosis not present

## 2019-12-25 DIAGNOSIS — F329 Major depressive disorder, single episode, unspecified: Secondary | ICD-10-CM | POA: Diagnosis present

## 2019-12-25 DIAGNOSIS — S3991XA Unspecified injury of abdomen, initial encounter: Secondary | ICD-10-CM | POA: Diagnosis not present

## 2019-12-25 DIAGNOSIS — E875 Hyperkalemia: Secondary | ICD-10-CM | POA: Diagnosis not present

## 2019-12-25 DIAGNOSIS — Z8711 Personal history of peptic ulcer disease: Secondary | ICD-10-CM

## 2019-12-25 DIAGNOSIS — R0689 Other abnormalities of breathing: Secondary | ICD-10-CM | POA: Diagnosis not present

## 2019-12-25 DIAGNOSIS — Z9861 Coronary angioplasty status: Secondary | ICD-10-CM

## 2019-12-25 DIAGNOSIS — R5381 Other malaise: Secondary | ICD-10-CM | POA: Diagnosis not present

## 2019-12-25 DIAGNOSIS — Z8616 Personal history of COVID-19: Secondary | ICD-10-CM

## 2019-12-25 DIAGNOSIS — I10 Essential (primary) hypertension: Secondary | ICD-10-CM | POA: Diagnosis not present

## 2019-12-25 DIAGNOSIS — M79604 Pain in right leg: Secondary | ICD-10-CM | POA: Insufficient documentation

## 2019-12-25 DIAGNOSIS — IMO0002 Reserved for concepts with insufficient information to code with codable children: Secondary | ICD-10-CM

## 2019-12-25 DIAGNOSIS — K2101 Gastro-esophageal reflux disease with esophagitis, with bleeding: Secondary | ICD-10-CM | POA: Diagnosis not present

## 2019-12-25 DIAGNOSIS — Z87891 Personal history of nicotine dependence: Secondary | ICD-10-CM

## 2019-12-25 DIAGNOSIS — R488 Other symbolic dysfunctions: Secondary | ICD-10-CM | POA: Diagnosis not present

## 2019-12-25 HISTORY — DX: Conduction disorder, unspecified: I45.9

## 2019-12-25 HISTORY — DX: Unspecified diastolic (congestive) heart failure: I50.30

## 2019-12-25 HISTORY — DX: Paroxysmal atrial fibrillation: I48.0

## 2019-12-25 LAB — CBC WITH DIFFERENTIAL/PLATELET
Abs Immature Granulocytes: 0.04 10*3/uL (ref 0.00–0.07)
Basophils Absolute: 0 10*3/uL (ref 0.0–0.1)
Basophils Relative: 1 %
Eosinophils Absolute: 0.1 10*3/uL (ref 0.0–0.5)
Eosinophils Relative: 1 %
HCT: 32.8 % — ABNORMAL LOW (ref 39.0–52.0)
Hemoglobin: 10.5 g/dL — ABNORMAL LOW (ref 13.0–17.0)
Immature Granulocytes: 1 %
Lymphocytes Relative: 9 %
Lymphs Abs: 0.8 10*3/uL (ref 0.7–4.0)
MCH: 29.6 pg (ref 26.0–34.0)
MCHC: 32 g/dL (ref 30.0–36.0)
MCV: 92.4 fL (ref 80.0–100.0)
Monocytes Absolute: 0.9 10*3/uL (ref 0.1–1.0)
Monocytes Relative: 11 %
Neutro Abs: 6.3 10*3/uL (ref 1.7–7.7)
Neutrophils Relative %: 77 %
Platelets: 179 10*3/uL (ref 150–400)
RBC: 3.55 MIL/uL — ABNORMAL LOW (ref 4.22–5.81)
RDW: 13.8 % (ref 11.5–15.5)
WBC: 8.1 10*3/uL (ref 4.0–10.5)
nRBC: 0 % (ref 0.0–0.2)

## 2019-12-25 LAB — URINALYSIS, COMPLETE (UACMP) WITH MICROSCOPIC
Bilirubin Urine: NEGATIVE
Glucose, UA: 50 mg/dL — AB
Hgb urine dipstick: NEGATIVE
Ketones, ur: NEGATIVE mg/dL
Nitrite: NEGATIVE
Protein, ur: NEGATIVE mg/dL
Specific Gravity, Urine: 1.009 (ref 1.005–1.030)
pH: 5 (ref 5.0–8.0)

## 2019-12-25 LAB — COMPREHENSIVE METABOLIC PANEL
ALT: 13 U/L (ref 0–44)
AST: 21 U/L (ref 15–41)
Albumin: 4 g/dL (ref 3.5–5.0)
Alkaline Phosphatase: 75 U/L (ref 38–126)
Anion gap: 13 (ref 5–15)
BUN: 68 mg/dL — ABNORMAL HIGH (ref 8–23)
CO2: 29 mmol/L (ref 22–32)
Calcium: 9.6 mg/dL (ref 8.9–10.3)
Chloride: 96 mmol/L — ABNORMAL LOW (ref 98–111)
Creatinine, Ser: 2.22 mg/dL — ABNORMAL HIGH (ref 0.61–1.24)
GFR calc Af Amer: 30 mL/min — ABNORMAL LOW (ref >60)
GFR calc non Af Amer: 26 mL/min — ABNORMAL LOW (ref >60)
Glucose, Bld: 327 mg/dL — ABNORMAL HIGH (ref 70–99)
Potassium: 4.6 mmol/L (ref 3.5–5.1)
Sodium: 138 mmol/L (ref 135–145)
Total Bilirubin: 1 mg/dL (ref 0.3–1.2)
Total Protein: 7.9 g/dL (ref 6.5–8.1)

## 2019-12-25 LAB — ETHANOL: Alcohol, Ethyl (B): <10 mg/dL (ref <10)

## 2019-12-25 LAB — CK: Total CK: 216 U/L (ref 49–397)

## 2019-12-25 LAB — TROPONIN I (HIGH SENSITIVITY)
Troponin I (High Sensitivity): 110 ng/L (ref <18)
Troponin I (High Sensitivity): 116 ng/L (ref <18)
Troponin I (High Sensitivity): 143 ng/L (ref <18)

## 2019-12-25 LAB — T4, FREE: Free T4: 1.61 ng/dL — ABNORMAL HIGH (ref 0.61–1.12)

## 2019-12-25 LAB — TSH: TSH: 3.672 u[IU]/mL (ref 0.350–4.500)

## 2019-12-25 MED ORDER — CITALOPRAM HYDROBROMIDE 20 MG PO TABS: 20.0000 mg | ORAL_TABLET | Freq: Every day | ORAL | Status: DC

## 2019-12-25 MED ORDER — APIXABAN 2.5 MG PO TABS
2.5000 mg | ORAL_TABLET | Freq: Two times a day (BID) | ORAL | Status: DC
  Administered 2019-12-25 – 2020-01-01 (×14): 2.5 mg via ORAL
  Filled 2019-12-25 (×14): qty 1

## 2019-12-25 MED ORDER — ACETAMINOPHEN 650 MG RE SUPP: 650.0000 mg | Freq: Four times a day (QID) | RECTAL | Status: DC | PRN

## 2019-12-25 MED ORDER — ONDANSETRON HCL 4 MG/2ML IJ SOLN: 4.0000 mg | Freq: Four times a day (QID) | INTRAMUSCULAR | Status: DC | PRN

## 2019-12-25 MED ORDER — AMIODARONE HCL 200 MG PO TABS
200.0000 mg | ORAL_TABLET | Freq: Every day | ORAL | Status: DC
  Administered 2019-12-26 – 2020-01-01 (×7): 200 mg via ORAL
  Filled 2019-12-25 (×7): qty 1

## 2019-12-25 MED ORDER — IPRATROPIUM-ALBUTEROL 0.5-2.5 (3) MG/3ML IN SOLN: 3.0000 mL | Freq: Four times a day (QID) | RESPIRATORY_TRACT | Status: DC | PRN

## 2019-12-25 MED ORDER — METHYLPREDNISOLONE SODIUM SUCC 40 MG IJ SOLR
40.0000 mg | Freq: Three times a day (TID) | INTRAMUSCULAR | Status: DC
  Administered 2019-12-25 – 2019-12-27 (×5): 40 mg via INTRAVENOUS
  Filled 2019-12-25 (×5): qty 1

## 2019-12-25 MED ORDER — TRAZODONE HCL 50 MG PO TABS: 25.0000 mg | ORAL_TABLET | Freq: Every evening | ORAL | Status: DC | PRN

## 2019-12-25 MED ORDER — ASCORBIC ACID 500 MG PO TABS
500.0000 mg | ORAL_TABLET | Freq: Every day | ORAL | Status: DC
  Administered 2019-12-26 – 2020-01-01 (×7): 500 mg via ORAL
  Filled 2019-12-25 (×7): qty 1

## 2019-12-25 MED ORDER — POTASSIUM CHLORIDE CRYS ER 20 MEQ PO TBCR: 20.0000 meq | EXTENDED_RELEASE_TABLET | Freq: Every day | ORAL | Status: DC

## 2019-12-25 MED ORDER — FINASTERIDE 5 MG PO TABS
5.0000 mg | ORAL_TABLET | Freq: Every day | ORAL | Status: DC
  Administered 2019-12-26 – 2020-01-01 (×7): 5 mg via ORAL
  Filled 2019-12-25 (×7): qty 1

## 2019-12-25 MED ORDER — CITALOPRAM HYDROBROMIDE 20 MG PO TABS
10.0000 mg | ORAL_TABLET | Freq: Every day | ORAL | Status: DC
  Administered 2019-12-26 – 2020-01-01 (×7): 10 mg via ORAL
  Filled 2019-12-25 (×7): qty 1

## 2019-12-25 MED ORDER — PANTOPRAZOLE SODIUM 40 MG PO TBEC
40.0000 mg | DELAYED_RELEASE_TABLET | Freq: Every day | ORAL | Status: DC
  Administered 2019-12-26 – 2020-01-01 (×7): 40 mg via ORAL
  Filled 2019-12-25 (×7): qty 1

## 2019-12-25 MED ORDER — NITROGLYCERIN 0.4 MG SL SUBL: 0.4000 mg | SUBLINGUAL_TABLET | SUBLINGUAL | Status: DC | PRN

## 2019-12-25 MED ORDER — SODIUM CHLORIDE 0.9 % IV BOLUS
1000.0000 mL | Freq: Once | INTRAVENOUS | Status: AC
  Administered 2019-12-25: 1000 mL via INTRAVENOUS

## 2019-12-25 MED ORDER — ONDANSETRON HCL 4 MG PO TABS: 4.0000 mg | ORAL_TABLET | Freq: Four times a day (QID) | ORAL | Status: DC | PRN

## 2019-12-25 MED ORDER — INSULIN GLARGINE 100 UNIT/ML ~~LOC~~ SOLN: 22.0000 [IU] | Freq: Two times a day (BID) | SUBCUTANEOUS | Status: DC

## 2019-12-25 MED ORDER — MAGNESIUM HYDROXIDE 400 MG/5ML PO SUSP: 30.0000 mL | Freq: Every day | ORAL | Status: DC | PRN

## 2019-12-25 MED ORDER — SODIUM CHLORIDE 0.9 % IV SOLN: INTRAVENOUS | Status: DC

## 2019-12-25 MED ORDER — ACETAMINOPHEN 325 MG PO TABS
650.0000 mg | ORAL_TABLET | Freq: Four times a day (QID) | ORAL | Status: DC | PRN
  Administered 2019-12-31: 650 mg via ORAL
  Filled 2019-12-25: qty 2

## 2019-12-25 MED ORDER — GUAIFENESIN ER 600 MG PO TB12
600.0000 mg | ORAL_TABLET | Freq: Two times a day (BID) | ORAL | Status: DC | PRN
  Administered 2019-12-28: 600 mg via ORAL
  Filled 2019-12-25: qty 1

## 2019-12-25 MED ORDER — TRAZODONE HCL 50 MG PO TABS
50.0000 mg | ORAL_TABLET | Freq: Every day | ORAL | Status: DC
  Administered 2019-12-25 – 2019-12-31 (×7): 50 mg via ORAL
  Filled 2019-12-25 (×7): qty 1

## 2019-12-25 MED ORDER — INSULIN GLARGINE 100 UNIT/ML ~~LOC~~ SOLN
15.0000 [IU] | Freq: Every morning | SUBCUTANEOUS | Status: DC
  Administered 2019-12-26 – 2020-01-01 (×7): 15 [IU] via SUBCUTANEOUS
  Filled 2019-12-25 (×8): qty 0.15

## 2019-12-25 MED ORDER — INSULIN GLARGINE 100 UNIT/ML ~~LOC~~ SOLN
15.0000 [IU] | Freq: Every day | SUBCUTANEOUS | Status: DC
  Administered 2019-12-26 – 2019-12-31 (×6): 15 [IU] via SUBCUTANEOUS
  Filled 2019-12-25 (×8): qty 0.15

## 2019-12-25 MED ORDER — MAGNESIUM OXIDE 400 (241.3 MG) MG PO TABS
200.0000 mg | ORAL_TABLET | Freq: Every day | ORAL | Status: DC
  Administered 2019-12-26 – 2020-01-01 (×7): 200 mg via ORAL
  Filled 2019-12-25 (×7): qty 1

## 2019-12-25 MED ORDER — ATORVASTATIN CALCIUM 20 MG PO TABS
20.0000 mg | ORAL_TABLET | Freq: Every day | ORAL | Status: DC
  Administered 2019-12-26 – 2019-12-31 (×6): 20 mg via ORAL
  Filled 2019-12-25 (×7): qty 1

## 2019-12-25 MED ORDER — SODIUM CHLORIDE 0.9 % IV BOLUS
500.0000 mL | Freq: Once | INTRAVENOUS | Status: AC
  Administered 2019-12-25: 500 mL via INTRAVENOUS

## 2019-12-25 MED ORDER — TRAMADOL HCL 50 MG PO TABS
50.0000 mg | ORAL_TABLET | Freq: Two times a day (BID) | ORAL | Status: DC | PRN
  Administered 2019-12-27: 50 mg via ORAL
  Filled 2019-12-25: qty 1

## 2019-12-25 MED ORDER — IPRATROPIUM-ALBUTEROL 0.5-2.5 (3) MG/3ML IN SOLN: 3.0000 mL | Freq: Four times a day (QID) | RESPIRATORY_TRACT | Status: DC

## 2019-12-25 NOTE — ED Notes (Signed)
Lab to run repeat troponin on green top just sent to lab.

## 2019-12-25 NOTE — Assessment & Plan Note (Signed)
BP elevated today. Encouraged more close monitoring of this.

## 2019-12-25 NOTE — Telephone Encounter (Signed)
Claiborne Rigg, patient's daughter in law called today. She stated she spoke with [facility] and they have not received anything for the patient to get a hospital bed. She was not sure if there is something she needs to take to them for the patient to receive this or if something was suppose to be sent over from our office Please advise

## 2019-12-25 NOTE — ED Provider Notes (Signed)
[facility] Emergency Department Provider Note  ____________________________________________   First MD Initiated Contact with Patient 12/25/19 1503     (approximate)  I have reviewed the triage vital signs and the nursing notes.   HISTORY  Chief Complaint Weakness    HPI Charles [name] is a 86 y.o. male with coronary disease status post CABG, CHF, COVID-19 back in December 2020, CKD, status post TAVR who comes in for increasing weakness.  Patient comes in for inability to care for himself independently.  Patient was seen on 3/17 after a fall.  During this time patient had x-ray that was negative.  Patient was able to bear weight but not really able to ambulate on the leg.  Since then it sounds like patient's been unable to care for himself independently.  He spent the last 5 days in the recliner and not cleaning himself up.  Family is concerned about a foul smell to his urine and increasing weakness.  Patient does wear 3 L of oxygen at baseline.  Patient himself states that he is not sure exactly why he is there although he is alert and oriented x3.  Patient was seen by the primary care over a televisit yesterday.  We did a ambulatory referral for home health and palliative care.          Past Medical History:  Diagnosis Date  . Abdominal aortic atherosclerosis ([place])    by xray  . Arthritis    in lower back (06/24/2015)  . Atrial flutter ([place])    notes 06/24/2015  . AV block, [place] 1 09/06/2017   Archie Endo 09/06/2017  . 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 CAD (coronary artery disease) 2004   a. s/p CABG in 2004 with LIMA-LAD, SVG-RCA, SVG-OM, and SVG-RI  . Carotid stenosis 1999   s/p L CEA  . Chronic diastolic CHF (congestive heart failure) ([place])   . Colon polyp 2005   (Dr. Tiffany Kocher)  . Compression fracture of L1 lumbar vertebra (HCC) remote  . COPD (chronic obstructive pulmonary disease) ([place]) 03/2011   by xray    . COVID-19 09/06/2019  . Depression    hx  . GERD (gastroesophageal reflux disease) 2003   h/o duodenal ulcer per EGD as well as esophagitis  . History of colon polyps 2003, 2005   adenomatous Vira Agar)  . History of tobacco abuse   . HLD (hyperlipidemia)   . HTN (hypertension)   . Pneumonia due to COVID-19 virus 09/07/2019   Hospitalization 09/2019  . Presence of permanent cardiac pacemaker   . Psoriasis   . Renal artery stenosis ([place]) 2004   70% bilateral, followed by cards  . 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 Severe aortic stenosis   . T2DM (type 2 diabetes mellitus) ([place]) 1995  . Urge incontinence of urine     Patient Active Problem List   Diagnosis Date Noted  . Right leg pain 12/25/2019  . Centrilobular emphysema ([place]) 10/29/2019  . Acute on chronic diastolic (congestive) heart failure ([place]) 10/08/2019  . Hypotension 10/04/2019  . Protein-calorie malnutrition, moderate ([place]) 10/04/2019  . DNR (do not resuscitate) present on admission 09/08/2019  . Chronic respiratory failure ([place]) 07/19/2019  . CKD stage 3 due to type 2 diabetes mellitus ([place]) 12/27/2018  . S/P TAVR (transcatheter aortic valve replacement)   . S/P  placement of cardiac pacemaker  04/22/2018  . Paroxysmal atrial flutter ([place])   . Scrotal swelling 02/16/2018  . Coronary artery disease involving native coronary artery of native heart without angina pectoris   . Chronic heart failure with preserved ejection fraction (HFpEF) ([place]) 09/06/2017  . BPH (benign prostatic hyperplasia) 07/01/2017  . Chronic anticoagulation 11/05/2016  . Chronic radicular pain of lower back 02/17/2016  . Aortic stenosis, severe 06/04/2014  . General weakness 04/02/2014  . DDD (degenerative disc disease), lumbar 10/02/2013  . Ex-smoker   . Carotid arterial disease ([place]) 08/23/2013  . MDD (major depressive  disorder), recurrent episode, moderate ([place]) 09/21/2007  . Osteoarthritis 09/21/2007  . COLONIC POLYPS 01/31/2007  . Diabetes mellitus type 2, uncontrolled, with complications ([place]) [id number]  . HYPERCHOLESTEROLEMIA 01/31/2007  . ERECTILE DYSFUNCTION 01/31/2007  . Essential hypertension 01/31/2007  . GERD 01/31/2007  . PSORIASIS 01/31/2007    Past Surgical History:  Procedure Laterality Date  . CARDIAC CATHETERIZATION  03/06/2003   No intervention - recommend CABG  . CARDIOVASCULAR STRESS TEST  11/2010   normal perfusion, no evidence of ischemia, EF 62% post exercise  . CARDIOVASCULAR STRESS TEST  11/28/2012   Mild diaphragmatic attenuation; cannot exclude a focal region of nontransmural inferior scar  . CARDIOVERSION N/A 06/25/2015   Procedure: CARDIOVERSION;  Surgeon: Pixie Casino, MD;  Location: [facility] ENDOSCOPY;  Service: Cardiovascular;  Laterality: N/A;  . CARDIOVERSION N/A 11/02/2016   Procedure: CARDIOVERSION;  Surgeon: [name], MD;  Location: [place];  Service: Cardiovascular;  Laterality: N/A;  . CARDIOVERSION N/A 03/20/2019   Procedure: CARDIOVERSION;  Surgeon: Jerline Pain, MD;  Location: [place];  Service: Cardiovascular;  Laterality: N/A;  . CAROTID ENDARTERECTOMY Left 1999   ([place])  . CATARACT EXTRACTION W/ INTRAOCULAR LENS  IMPLANT, BILATERAL Bilateral 01/2013   [place]  . COLONOSCOPY  2003   colon polyp x3 - adenomatous Tiffany Kocher)  . COLONOSCOPY  10/08/2012   2 TA, diverticulosis, int hem, no rpt rec Tiffany Kocher)  . CORONARY ANGIOPLASTY    . CORONARY ARTERY BYPASS GRAFT  03/07/2003   4v CABG ([place]) with LIMA to LAD, vein graft to RCA, 1st obtuse marginal, and ramus intermedius  . ESOPHAGOGASTRODUODENOSCOPY  10/08/2012   nl esophagus, duodenitis and erosive gastropathy, path - gastropathy no Hpylori, no rpt rec  . LUMBAR EPIDURAL INJECTION  03/2017   L5/S1 ([place])  . PACEMAKER IMPLANT N/A 04/21/2018   Procedure: PACEMAKER IMPLANT -- Dual Chamber;  Surgeon:  Deboraha Sprang, MD;  Location: [place] CV LAB;  Service: Cardiovascular;  Laterality: N/A;  . PERIPHERAL VASCULAR CATHETERIZATION N/A 02/02/2016   Procedure: Renal Angiography;  Surgeon: [name], MD;  Location: [place] CV LAB;  Service: Cardiovascular;  Laterality: N/A;  . PERIPHERAL VASCULAR CATHETERIZATION N/A 02/02/2016   Procedure: Abdominal Aortogram;  Surgeon: [name], MD;  Location: [place] CV LAB;  Service: Cardiovascular;  Laterality: N/A;  . RENAL DOPPLER  11/29/2011   Celiac&SMA-demonstrated vessel narrowing suggestive of a greater than 50% diameter reduction. Bilateral renal arteries-demonstrated vessel narrowing of 60-99% diameter reduction. Rt Kidney-mid pole lateral simple cyst noted measuring 1.29x0.76x1.11cm and exophytic cyst outside lower pole measuring 1.23x0.96x1.31. Lft Kidney-lateral mid to lower pole simple cyst measuering-1.24x9.83x1.24  . RIGHT/LEFT HEART CATH AND CORONARY/GRAFT ANGIOGRAPHY N/A 10/20/2017   Procedure: RIGHT/LEFT HEART CATH AND CORONARY/GRAFT ANGIOGRAPHY;  Surgeon: Burnell Blanks, MD;  Location: [place] CV LAB;  Service: Cardiovascular;  Laterality: N/A;  . TEE WITHOUT CARDIOVERSION N/A 06/25/2015   Procedure:  TRANSESOPHAGEAL ECHOCARDIOGRAM (TEE);  Surgeon: Pixie Casino, MD;  Location: [facility] ENDOSCOPY;  Service: Cardiovascular;  Laterality: N/A;  . TEE WITHOUT CARDIOVERSION N/A 11/02/2016   Procedure: TRANSESOPHAGEAL ECHOCARDIOGRAM (TEE);  Surgeon: [name], MD;  Location: [place];  Service: Cardiovascular;  Laterality: N/A;  . TEE WITHOUT CARDIOVERSION N/A 12/05/2018   Procedure: TRANSESOPHAGEAL ECHOCARDIOGRAM (TEE);  Surgeon: Sherren Mocha, MD;  Location: [place];  Service: Open Heart Surgery;  Laterality: N/A;  . TONSILLECTOMY    . TRANSCATHETER AORTIC VALVE REPLACEMENT, TRANSFEMORAL N/A 12/05/2018   Procedure: TRANSCATHETER AORTIC VALVE REPLACEMENT, TRANSFEMORAL;  Surgeon: Sherren Mocha, MD;  Location: [place];  Service: Open Heart Surgery;  Laterality: N/A;  . UPPER GASTROINTESTINAL ENDOSCOPY  2003   reflux esophagitis, erosive gastropathy, duodenal ulcer    Prior to Admission medications   Medication Sig Start Date End Date Taking? Authorizing Provider  acetaminophen (TYLENOL) 500 MG tablet Take 1 tablet (500 mg total) by mouth 3 (three) times daily. 02/17/16   [name], MD  amiodarone (PACERONE) 200 MG tablet Take 1 tablet (200 mg total) by mouth daily. 08/21/19   Eugenie Filler, MD  apixaban (ELIQUIS) 2.5 MG TABS tablet Take 1 tablet (2.5 mg total) by mouth 2 (two) times daily. 06/29/19   [name], MD  Ascorbic Acid (VITAMIN C PO) Take 1 tablet by mouth daily.    [provider]  atorvastatin (LIPITOR) 20 MG tablet Take 1 tablet (20 mg total) by mouth daily. 11/21/19   [name], MD  BD INSULIN SYRINGE U/F 30G X 1/2" 0.5 ML MISC AS DIRECTED. 03/06/19   [name], MD  budesonide-formoterol [facility]) 160-4.5 MCG/ACT inhaler Inhale 2 puffs into the lungs 2 (two) times daily. 08/20/19   Eugenie Filler, MD  citalopram (CELEXA) 20 MG tablet Take 1 tablet (20 mg total) by mouth daily. 07/16/19   [name], MD  finasteride (PROSCAR) 5 MG tablet Take 1 tablet (5 mg total) by mouth daily. 04/03/19   [name], MD  glucose blood (ACCU-CHEK AVIVA PLUS) test strip Use as instructed to check blood sugar three times a day. Dx code E11.8 & E11.65 04/10/19   [name], MD  guaiFENesin (MUCINEX) 600 MG 12 hr tablet Take 600 mg by mouth 2 (two) times daily as needed for cough or to loosen phlegm.    [provider]  insulin glargine (LANTUS) 100 UNIT/ML injection Lantus 22 units in the morning and 20 units at bedtime. 08/20/19   Eugenie Filler, MD  lisinopril (ZESTRIL) 10 MG tablet Take 1 tablet (10 mg total) by mouth daily. 10/02/19 12/24/19  [name], MD  Magnesium Oxide (MAG-OXIDE) 200 MG TABS Take 1 tablet (200 mg total)  by mouth daily. 11/12/19   Clegg, Amy D, NP  metFORMIN (GLUCOPHAGE) 500 MG tablet Take 1 tablet (500 mg total) by mouth 2 (two) times daily with a meal. 12/24/19   [name], MD  metolazone (ZAROXOLYN) 5 MG tablet Take 1 tablet (5 mg total) by mouth once a week. 12/24/19   [name], MD  nitroGLYCERIN (NITROSTAT) 0.4 MG SL tablet Place 1 tablet (0.4 mg total) under the tongue every 5 (five) minutes as needed for chest pain. 03/08/18   Almyra Deforest, PA  pantoprazole (PROTONIX) 40 MG tablet Take 1 tablet (40 mg total) by mouth daily. Take 1 tablet (20 mg total) by mouth daily. 08/20/19   Eugenie Filler, MD  potassium chloride SA (KLOR-CON) 20 MEQ tablet Take 1 tablet (20 mEq  total) every Monday, Wednesday, Friday, Saturday, and Sunday. Take an additional 56meq with Metolazone. 10/30/19   Clegg, Amy D, NP  torsemide (DEMADEX) 20 MG tablet Take 3 tablets (60 mg total) by mouth 3 (three) times daily. TEMPORARY INCREASE IN DOSE 10/23/19   Tonia Ghent, MD  traMADol (ULTRAM) 50 MG tablet Take 1 tablet (50 mg total) by mouth every 6 (six) hours as needed for up to 7 days. 12/19/19 12/26/19  Arta Silence, MD  traZODone (DESYREL) 50 MG tablet Take 1 tablet (50 mg total) by mouth at bedtime. 08/20/19   Eugenie Filler, MD    Allergies Gabapentin, Metoprolol, Spironolactone, Amlodipine, Other, Rosiglitazone maleate, and Tricor [fenofibrate]  Family History  Problem Relation Age of Onset  . Hypertension Father   . Heart disease Father   . Cancer Mother        GI cancer  . Diabetes Sister   . Heart disease Sister   . Stroke Sister   . Hypertension Sister   . Cancer Brother        Lung cancer - nonsmoker  . Cancer Maternal Grandmother   . Heart attack Maternal Grandfather   . Heart attack Paternal Grandmother   . Heart attack Paternal Grandfather   . Cancer Brother        Pancreatic cancer  . Lung disease Brother     Social History Social History   Tobacco Use  . Smoking  status: Former Smoker    Packs/day: 0.50    Years: 36.00    Pack years: 18.00    Types: Cigarettes    Quit date: 07/23/2013    Years since quitting: 6.4  . Smokeless tobacco: Never Used  Substance Use Topics  . Alcohol use: No  . Drug use: No      Review of Systems Constitutional: No fever/chills, weakness, unable to care for self Eyes: No visual changes. ENT: No sore throat. Cardiovascular: Denies chest pain. Respiratory: Denies shortness of breath. Gastrointestinal: No abdominal pain.  No nausea, no vomiting.  No diarrhea.  No constipation. Genitourinary: Negative for dysuria. Musculoskeletal: Negative for back pain. Skin: Negative for rash. Neurological: Negative for headaches, focal weakness or numbness. All other ROS negative ____________________________________________   PHYSICAL EXAM:  VITAL SIGNS: ED Triage Vitals  Enc Vitals Group     BP 12/25/19 1433 (!) 138/58     Pulse Rate 12/25/19 1433 99     Resp 12/25/19 1433 19     Temp 12/25/19 1433 98.5 F (36.9 C)     Temp Source 12/25/19 1433 Oral     SpO2 12/25/19 1432 98 %     Weight 12/25/19 1434 235 lb (106.6 kg)     Height 12/25/19 1434 6\' 1"  (1.854 m)     Head Circumference --      Peak Flow --      Pain Score 12/25/19 1433 0     Pain Loc --      Pain Edu? --      Excl. in [place]? --     Constitutional: Alert and oriented x3. Well appearing and in no acute distress. Eyes: Conjunctivae are normal. EOMI. Head: Atraumatic. Nose: No congestion/rhinnorhea. Mouth/Throat: Mucous membranes are moist.   Neck: No stridor. Trachea Midline. FROM Cardiovascular: Normal rate, regular rhythm. Grossly normal heart sounds.  Good peripheral circulation. Respiratory: Slight wheezing bilateral but patient states that this is baseline for him.  On 3 L. Gastrointestinal: Soft and nontender. No distention. No abdominal bruits.  Musculoskeletal:  Effusion of the right knee.  Unable to really lift either leg up off the bed  although does not seem to be super tender anywhere upon palpation. Neurologic:  Normal speech and language. No gross focal neurologic deficits are appreciated.  Unable to lift either leg up off the ground but able to wiggle his toes.  Equal strength in his arms. Skin:  Skin is warm, dry and intact. No rash noted. Psychiatric: Mood and affect are normal. Speech and behavior are normal. GU: Deferred   ____________________________________________   LABS (all labs ordered are listed, but only abnormal results are displayed)  Labs Reviewed  COMPREHENSIVE METABOLIC PANEL - Abnormal; Notable for the following components:      Result Value   Chloride 96 (*)    Glucose, Bld 327 (*)    BUN 68 (*)    Creatinine, Ser 2.22 (*)    GFR calc non Af Amer 26 (*)    GFR calc Af Amer 30 (*)    All other components within normal limits  CBC WITH DIFFERENTIAL/PLATELET - Abnormal; Notable for the following components:   RBC 3.55 (*)    Hemoglobin 10.5 (*)    HCT 32.8 (*)    All other components within normal limits  URINALYSIS, COMPLETE (UACMP) WITH MICROSCOPIC - Abnormal; Notable for the following components:   Color, Urine YELLOW (*)    APPearance CLEAR (*)    Glucose, UA 50 (*)    Leukocytes,Ua TRACE (*)    Bacteria, UA RARE (*)    All other components within normal limits  ETHANOL  CK  TSH  T4, FREE  TROPONIN I (HIGH SENSITIVITY)   ____________________________________________   ED ECG REPORT I, Vanessa [place], the attending physician, personally viewed and interpreted this ECG.  EKG is atrial sensed ventricular paced rhythm rate of 94, no service or criteria, no T wave inversions wide QRS due to being paced.  EKG looks similar to prior ____________________________________________  RADIOLOGY   Official radiology report(s): CT Head Wo Contrast  Result Date: 12/25/2019 CLINICAL DATA:  86 year old male with head trauma. EXAM: CT HEAD WITHOUT CONTRAST CT CERVICAL SPINE WITHOUT CONTRAST  TECHNIQUE: Multidetector CT imaging of the head and cervical spine was performed following the standard protocol without intravenous contrast. Multiplanar CT image reconstructions of the cervical spine were also generated. COMPARISON:  None. FINDINGS: CT HEAD FINDINGS Brain: There is moderate age-related atrophy and chronic microvascular ischemic changes. Patchy area of white matter hypodensity in the right posterior parietal convexity, likely chronic microvascular ischemic changes. 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 There is no acute intracranial hemorrhage. No mass effect or midline shift. No extra-axial fluid collection. Vascular: No hyperdense vessel or unexpected calcification. Skull: Normal. Negative for fracture or focal lesion. Sinuses/Orbits: No acute finding. Other: None CT CERVICAL SPINE FINDINGS Alignment: No acute subluxation. Skull base and vertebrae: No acute fracture. Osteopenia. Soft tissues and spinal canal: No prevertebral fluid or swelling. No visible canal hematoma. Disc levels: Multilevel degenerative changes with endplate irregularity and disc space narrowing and bone spurring and osteophyte. Upper chest: Negative. Other: Bilateral carotid bulb calcified plaques. Partially visualized pacemaker wire. IMPRESSION: 1. No acute intracranial hemorrhage. 2. Age-related atrophy and chronic microvascular ischemic changes. 3. No acute/traumatic cervical spine pathology. Electronically Signed   By: Anner Crete M.D.   On: 12/25/2019 16:15   CT Cervical Spine Wo Contrast  Result Date: 12/25/2019 CLINICAL DATA:  86 year old male with head trauma. EXAM: CT HEAD  WITHOUT CONTRAST CT CERVICAL SPINE WITHOUT CONTRAST TECHNIQUE: Multidetector CT imaging of the head and cervical spine was performed following the standard protocol without intravenous contrast. Multiplanar CT image reconstructions of the cervical spine were also generated. COMPARISON:  None. FINDINGS: CT HEAD  FINDINGS Brain: There is moderate age-related atrophy and chronic microvascular ischemic changes. Patchy area of white matter hypodensity in the right posterior parietal convexity, likely chronic microvascular ischemic changes. 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 There is no acute intracranial hemorrhage. No mass effect or midline shift. No extra-axial fluid collection. Vascular: No hyperdense vessel or unexpected calcification. Skull: Normal. Negative for fracture or focal lesion. Sinuses/Orbits: No acute finding. Other: None CT CERVICAL SPINE FINDINGS Alignment: No acute subluxation. Skull base and vertebrae: No acute fracture. Osteopenia. Soft tissues and spinal canal: No prevertebral fluid or swelling. No visible canal hematoma. Disc levels: Multilevel degenerative changes with endplate irregularity and disc space narrowing and bone spurring and osteophyte. Upper chest: Negative. Other: Bilateral carotid bulb calcified plaques. Partially visualized pacemaker wire. IMPRESSION: 1. No acute intracranial hemorrhage. 2. Age-related atrophy and chronic microvascular ischemic changes. 3. No acute/traumatic cervical spine pathology. Electronically Signed   By: Anner Crete M.D.   On: 12/25/2019 16:15   CT Renal Stone Study  Result Date: 12/25/2019 CLINICAL DATA:  Status post fall. EXAM: CT ABDOMEN AND PELVIS WITHOUT CONTRAST TECHNIQUE: Multidetector CT imaging of the abdomen and pelvis was performed following the standard protocol without IV contrast. COMPARISON:  August 10, 2019 FINDINGS: Lower chest: Multiple sternal wires are seen. An artificial aortic valve is present. There is marked severity coronary artery calcification. Mild to moderate severity atelectasis and/or infiltrate is seen within the bilateral lung bases. Hepatobiliary: A punctate calcified granuloma is seen within the right lobe of the liver. No gallstones, gallbladder wall thickening, or biliary dilatation.  Pancreas: Unremarkable. No pancreatic ductal dilatation or surrounding inflammatory changes. Spleen: Normal in size without focal abnormality. Adrenals/Urinary Tract: A 1.4 cm low-attenuation right adrenal mass is seen. The left adrenal gland is normal in appearance. Kidneys are normal in size, without renal calculi or hydronephrosis. Multiple small bilateral renal cysts are seen. Bladder is unremarkable. Stomach/Bowel: Stomach is within normal limits. The appendix is not identified. No evidence of bowel wall thickening, distention, or inflammatory changes. Noninflamed diverticula are seen throughout the descending and sigmoid colon. Vascular/Lymphatic: Marked severity aortic calcification. No enlarged abdominal or pelvic lymph nodes. Reproductive: The prostate gland is mildly enlarged. A moderate to marked amount of prostate gland calcification is seen. There is a moderate size left-sided hydrocele which extends superiorly into the left inguinal region. Other: No abdominal wall hernia or abnormality. No abdominopelvic ascites. Musculoskeletal: A chronic appearing compression fracture deformity is seen at the level of the L1 vertebral body. Marked severity multilevel degenerative changes are noted throughout the lumbar spine. IMPRESSION: 1. Mild to moderate severity bibasilar atelectasis and/or infiltrate. 2. Diverticulosis without evidence of diverticulitis. 3. Moderate sized left-sided hydrocele which extends superiorly into the left inguinal region. 4. Marked severity coronary artery calcification. Aortic Atherosclerosis (ICD10-I70.0). Electronically Signed   By: Virgina Norfolk M.D.   On: 12/25/2019 16:27    ____________________________________________   PROCEDURES  Procedure(s) performed (including Critical Care):  Procedures   ____________________________________________   INITIAL IMPRESSION / ASSESSMENT AND PLAN / ED COURSE  JHACE RADHAKRISHNAN was evaluated in Emergency Department on  12/25/2019 for the symptoms described in the history of present illness. He was evaluated in the context of  the global COVID-19 pandemic, which necessitated consideration that the patient might be at risk for infection with the SARS-CoV-2 virus that causes COVID-19. Institutional protocols and algorithms that pertain to the evaluation of patients at risk for COVID-19 are in a state of rapid change based on information released by regulatory bodies including the CDC and federal and state organizations. These policies and algorithms were followed during the patient's care in the ED.    Patient is an 86 year old with increasing weakness after his fall 5 days ago.  Will get repeat x-ray to make sure there is no fracture, will get repeat chest x-ray to make sure no evidence of pneumonia.  Given patient did have a fall a few days ago will just get a CT head to make sure no subdural hematoma.  Will get labs to evaluate for electrolyte abnormalities, AKI, UTI  No active UTI.  Kidney function is slightly elevated from baseline we will give 500 cc of fluid.  CT imaging stable.  X-ray negative.  Labs are around his baseline.  Discussed with patient's family member listed as the primary contact with [place].  Updated on results.  He has been declining since he had Covid in December and then after the fall he is just continued to decline.  We discussed further imaging of the legs to evaluate for missed fracture but she stated that it did not seem like he was not able to stand up due to pain but it seemed more like generalized weakness of both of his legs.  At this time we have elected to hold off on further imaging of the legs giving does not seem tender he is just very weak and unable to lift up either leg.  I did discuss the following above results.    Patient cardiac markers are double his baseline.  His kidney functions also elevated at 2.22.  Again discussed with family about the options.  They are requesting  admission for further work-up of his heart.  Patient is able to lift up his legs off the bed at this time.  They are requesting physical therapy to see him given this huge decline over the past 5 days since his recent fall.       ____________________________________________   FINAL CLINICAL IMPRESSION(S) / ED DIAGNOSES   Final diagnoses:  AKI (acute kidney injury) ([place])  Weakness  Troponin level elevated      MEDICATIONS GIVEN DURING THIS VISIT:  Medications  traMADol (ULTRAM) tablet 50 mg (has no administration in time range)  amiodarone (PACERONE) tablet 200 mg (has no administration in time range)  atorvastatin (LIPITOR) tablet 20 mg (has no administration in time range)  nitroGLYCERIN (NITROSTAT) SL tablet 0.4 mg (has no administration in time range)  citalopram (CELEXA) tablet 20 mg (has no administration in time range)  traZODone (DESYREL) tablet 50 mg (has no administration in time range)  insulin glargine (LANTUS) injection 22 Units (has no administration in time range)  pantoprazole (PROTONIX) EC tablet 40 mg (has no administration in time range)  finasteride (PROSCAR) tablet 5 mg (has no administration in time range)  apixaban (ELIQUIS) tablet 2.5 mg (has no administration in time range)  ascorbic acid (VITAMIN C) tablet 500 mg (has no administration in time range)  Magnesium Oxide TABS 200 mg (has no administration in time range)  potassium chloride SA (KLOR-CON) CR tablet 20 mEq (has no administration in time range)  guaiFENesin (MUCINEX) 12 hr tablet 600 mg (has no administration in time range)  0.9 %  sodium chloride infusion (has no administration in time range)  acetaminophen (TYLENOL) tablet 650 mg (has no administration in time range)    Or  acetaminophen (TYLENOL) suppository 650 mg (has no administration in time range)  traZODone (DESYREL) tablet 25 mg (has no administration in time range)  ondansetron (ZOFRAN) tablet 4 mg (has no administration in time  range)    Or  ondansetron (ZOFRAN) injection 4 mg (has no administration in time range)  magnesium hydroxide (MILK OF MAGNESIA) suspension 30 mL (has no administration in time range)  methylPREDNISolone sodium succinate (SOLU-MEDROL) 40 mg/mL injection 40 mg (has no administration in time range)  ipratropium-albuterol (DUONEB) 0.5-2.5 (3) MG/3ML nebulizer solution 3 mL (has no administration in time range)  sodium chloride 0.9 % bolus 1,000 mL (0 mLs Intravenous Stopped 12/25/19 1631)  sodium chloride 0.9 % bolus 500 mL (500 mLs Intravenous New Bag/Given 12/25/19 1742)     ED Discharge Orders    None       Note:  This document was prepared using Dragon voice recognition software and may include unintentional dictation errors.   Vanessa [place], MD 12/25/19 [phone]

## 2019-12-25 NOTE — Assessment & Plan Note (Addendum)
Recent albumin low. Discussed importance of nutritious diet, encouraged protein with each meal and 3 meals a day.

## 2019-12-25 NOTE — Assessment & Plan Note (Signed)
Will need to verify next visit.

## 2019-12-25 NOTE — ED Notes (Signed)
Pt in NAD upon this RN arrival. Pt RR unlabored, regular. Pt denies any further needs at this time.

## 2019-12-25 NOTE — Assessment & Plan Note (Signed)
Continues oxygen. 

## 2019-12-25 NOTE — Assessment & Plan Note (Addendum)
After fall - ER eval reassuring. Ongoing weakness. I will ask [facility] to come out for assessment and work towards fall prevention. I advised in-office eval if ongoing R leg trouble.

## 2019-12-25 NOTE — Assessment & Plan Note (Signed)
Uncontrolled based on cbg's. Will slowly titrate up lantus by 1 unit Q3d if averaging >150 to 25u bid.

## 2019-12-25 NOTE — ED Triage Notes (Signed)
Pt presents to ed via [facility] with c/o inability to care for self independently. Pt family reports after recent fall, pt has been unable to care for self. Family reports pt has spent 5 days in recliner and unable to clean self up. Family of pt reports pt's urine has foul smell and dark in color. Pt currently alert and orientedx4. Pt reports "feeling too weak to do anything". Pt wears 3L nasal cannula chronically.

## 2019-12-25 NOTE — Telephone Encounter (Signed)
Faxed 12/24/19 office note to [facility] at [phone] [see Pt Msg, 12/21/19].   Notified [name], by phn, office note was faxed.  Expresses her thanks.

## 2019-12-25 NOTE — Assessment & Plan Note (Signed)
Worse weakness since COVID pneumonia 09/2019, never fully returned to baseline after this illness.  Now more weak and unable to get into his high bed or out of recliner on his own. Has had several falls at home. Will ask [facility] for evaluation as well as refer to palliative care team for home evaluation. He desires DNR.

## 2019-12-25 NOTE — Assessment & Plan Note (Addendum)
BNP, troponin at ER elevated but seem stable. He doesn't describe CHF exacerbation symptoms. Continues torsemide with once weekly metolazone.

## 2019-12-26 ENCOUNTER — Telehealth: Payer: Self-pay | Admitting: Primary Care

## 2019-12-26 ENCOUNTER — Encounter: Payer: Self-pay | Admitting: Family Medicine

## 2019-12-26 DIAGNOSIS — I4891 Unspecified atrial fibrillation: Secondary | ICD-10-CM

## 2019-12-26 DIAGNOSIS — L8990 Pressure ulcer of unspecified site, unspecified stage: Secondary | ICD-10-CM | POA: Insufficient documentation

## 2019-12-26 DIAGNOSIS — R531 Weakness: Secondary | ICD-10-CM

## 2019-12-26 DIAGNOSIS — J449 Chronic obstructive pulmonary disease, unspecified: Secondary | ICD-10-CM

## 2019-12-26 DIAGNOSIS — R778 Other specified abnormalities of plasma proteins: Secondary | ICD-10-CM

## 2019-12-26 LAB — BASIC METABOLIC PANEL
Anion gap: 10 (ref 5–15)
BUN: 62 mg/dL — ABNORMAL HIGH (ref 8–23)
CO2: 31 mmol/L (ref 22–32)
Calcium: 9.1 mg/dL (ref 8.9–10.3)
Chloride: 101 mmol/L (ref 98–111)
Creatinine, Ser: 1.94 mg/dL — ABNORMAL HIGH (ref 0.61–1.24)
GFR calc Af Amer: 36 mL/min — ABNORMAL LOW (ref >60)
GFR calc non Af Amer: 31 mL/min — ABNORMAL LOW (ref >60)
Glucose, Bld: 224 mg/dL — ABNORMAL HIGH (ref 70–99)
Potassium: 4 mmol/L (ref 3.5–5.1)
Sodium: 142 mmol/L (ref 135–145)

## 2019-12-26 LAB — GLUCOSE, CAPILLARY
Glucose-Capillary: 189 mg/dL — ABNORMAL HIGH (ref 70–99)
Glucose-Capillary: 192 mg/dL — ABNORMAL HIGH (ref 70–99)
Glucose-Capillary: 222 mg/dL — ABNORMAL HIGH (ref 70–99)
Glucose-Capillary: 224 mg/dL — ABNORMAL HIGH (ref 70–99)
Glucose-Capillary: 58 mg/dL — ABNORMAL LOW (ref 70–99)
Glucose-Capillary: 86 mg/dL (ref 70–99)
Glucose-Capillary: 93 mg/dL (ref 70–99)

## 2019-12-26 LAB — CBC
HCT: 31.1 % — ABNORMAL LOW (ref 39.0–52.0)
Hemoglobin: 9.8 g/dL — ABNORMAL LOW (ref 13.0–17.0)
MCH: 29.5 pg (ref 26.0–34.0)
MCHC: 31.5 g/dL (ref 30.0–36.0)
MCV: 93.7 fL (ref 80.0–100.0)
Platelets: 182 10*3/uL (ref 150–400)
RBC: 3.32 MIL/uL — ABNORMAL LOW (ref 4.22–5.81)
RDW: 13.8 % (ref 11.5–15.5)
WBC: 7.4 10*3/uL (ref 4.0–10.5)
nRBC: 0 % (ref 0.0–0.2)

## 2019-12-26 LAB — HEMOGLOBIN A1C
Hgb A1c MFr Bld: 8.4 % — ABNORMAL HIGH (ref 4.8–5.6)
Mean Plasma Glucose: 194.38 mg/dL

## 2019-12-26 MED ORDER — INSULIN ASPART 100 UNIT/ML ~~LOC~~ SOLN
12.0000 [IU] | Freq: Once | SUBCUTANEOUS | Status: AC
  Administered 2019-12-26: 12 [IU] via SUBCUTANEOUS
  Filled 2019-12-26: qty 1

## 2019-12-26 MED ORDER — SODIUM CHLORIDE 0.9 % IV SOLN
500.0000 mg | INTRAVENOUS | Status: DC
  Administered 2019-12-26: 500 mg via INTRAVENOUS
  Filled 2019-12-26 (×2): qty 500

## 2019-12-26 MED ORDER — INSULIN ASPART 100 UNIT/ML ~~LOC~~ SOLN
0.0000 [IU] | SUBCUTANEOUS | Status: DC
  Administered 2019-12-26: 21:00:00 7 [IU] via SUBCUTANEOUS
  Administered 2019-12-26: 17:00:00 4 [IU] via SUBCUTANEOUS
  Administered 2019-12-26: 7 [IU] via SUBCUTANEOUS
  Administered 2019-12-27 (×2): 11 [IU] via SUBCUTANEOUS
  Administered 2019-12-27: 4 [IU] via SUBCUTANEOUS
  Administered 2019-12-27: 21:00:00 15 [IU] via SUBCUTANEOUS
  Administered 2019-12-27: 3 [IU] via SUBCUTANEOUS
  Administered 2019-12-28: 05:00:00 4 [IU] via SUBCUTANEOUS
  Administered 2019-12-28: 7 [IU] via SUBCUTANEOUS
  Administered 2019-12-28: 3 [IU] via SUBCUTANEOUS
  Administered 2019-12-28 (×2): 11 [IU] via SUBCUTANEOUS
  Administered 2019-12-28: 15 [IU] via SUBCUTANEOUS
  Administered 2019-12-29 (×3): 11 [IU] via SUBCUTANEOUS
  Administered 2019-12-29: 05:00:00 3 [IU] via SUBCUTANEOUS
  Administered 2019-12-29: 7 [IU] via SUBCUTANEOUS
  Administered 2019-12-30: 4 [IU] via SUBCUTANEOUS
  Administered 2019-12-30: 13:00:00 11 [IU] via SUBCUTANEOUS
  Administered 2019-12-30: 3 [IU] via SUBCUTANEOUS
  Administered 2019-12-30: 17:00:00 11 [IU] via SUBCUTANEOUS
  Administered 2019-12-30: 15 [IU] via SUBCUTANEOUS
  Administered 2019-12-30: 3 [IU] via SUBCUTANEOUS
  Administered 2019-12-31: 4 [IU] via SUBCUTANEOUS
  Filled 2019-12-26 (×27): qty 1

## 2019-12-26 NOTE — Consult Note (Signed)
Cardiology Consult    Patient ID: Charles [name] MRN: [id number], DOB/AGE: 03/05/1932   Admit date: 12/25/2019 Date of Consult: 12/26/2019  Primary Physician: [name], MD Primary Cardiologist: [name], MD/ CHF D. Bensimhon, MD  Requesting Provider: Lenise Herald, MD  Patient Profile    Charles [name] is a 86 y.o. male with a history of HFpEF, CAD s/p CABG x 4 (2004), AS s/p TAVR (12/2018), Paroxysmal atrial fib/flutter on amio/eliquis, high grade heart block s/p PPM (04/2018), HTN, HL, COVID 19 infection 09/2019, carotid stenosis s/p L CA, DMII, stage III chronic kidney disease, and renal artery stenosis, who is being seen today for the evaluation of weakness/elevated HsTroponin at the request of Dr. Jimmye Norman.  Past Medical History   Past Medical History:  Diagnosis Date  . (HFpEF) heart failure with preserved ejection fraction ([place])    a. 11/2019 Echo: EF 60-65%. Mild fxnl MS, mild MR. Mild AS. Ao root 90mm.  . Abdominal aortic atherosclerosis ([place])    by xray  . Arthritis    in lower back (06/24/2015)  . 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 CAD (coronary artery disease) 2004   a. s/p CABG in 2004 with LIMA-LAD, SVG-RCA, SVG-OM, and SVG-RI; b. 10/2018 Cath: 4/4 patent grafts.  . Carotid stenosis 1999   s/p L CEA  . Colon polyp 2005   (Dr. Tiffany Kocher)  . Compression fracture of L1 lumbar vertebra (HCC) remote  . COPD (chronic obstructive pulmonary disease) ([place]) 03/2011   by xray  . COVID-19 09/06/2019  . Depression    hx  . GERD (gastroesophageal reflux disease) 2003   h/o duodenal ulcer per EGD as well as esophagitis  . High-grade Heart block    a. 04/2018 s/p SJM PPM.  . History of colon polyps 2003, 2005   adenomatous Vira Agar)  . History of tobacco abuse   . HLD (hyperlipidemia)   . HTN (hypertension)   . Paroxysmal atrial fibrillation & flutter    a. s/p remote DCCV; b. CHA2DS2VASc = 6-->amio/eliquis.  . Pneumonia due to  COVID-19 virus 09/07/2019   Hospitalization 09/2019  . Presence of permanent cardiac pacemaker   . Psoriasis   . Renal artery stenosis ([place]) 2004   70% bilateral, followed by cards  . 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 Severe aortic stenosis    a. 12/2018 s/p TAVR; b. 11/2019 Echo: Mean/peak gradients 11/23 mmHg. AVA 1.54cm^2.  [name] T2DM (type 2 diabetes mellitus) ([place]) 1995  . Urge incontinence of urine     Past Surgical History:  Procedure Laterality Date  . CARDIAC CATHETERIZATION  03/06/2003   No intervention - recommend CABG  . CARDIOVASCULAR STRESS TEST  11/2010   normal perfusion, no evidence of ischemia, EF 62% post exercise  . CARDIOVASCULAR STRESS TEST  11/28/2012   Mild diaphragmatic attenuation; cannot exclude a focal region of nontransmural inferior scar  . CARDIOVERSION N/A 06/25/2015   Procedure: CARDIOVERSION;  Surgeon: Pixie Casino, MD;  Location: [facility] ENDOSCOPY;  Service: Cardiovascular;  Laterality: N/A;  . CARDIOVERSION N/A 11/02/2016   Procedure: CARDIOVERSION;  Surgeon: [name], MD;  Location: [facility];  Service: Cardiovascular;  Laterality: N/A;  . CARDIOVERSION N/A 03/20/2019   Procedure: CARDIOVERSION;  Surgeon: Jerline Pain, MD;  Location: [place];  Service: Cardiovascular;  Laterality: N/A;  . CAROTID ENDARTERECTOMY Left  1999   ([place])  . CATARACT EXTRACTION W/ INTRAOCULAR LENS  IMPLANT, BILATERAL Bilateral 01/2013   [place]  . COLONOSCOPY  2003   colon polyp x3 - adenomatous Tiffany Kocher)  . COLONOSCOPY  10/08/2012   2 TA, diverticulosis, int hem, no rpt rec Tiffany Kocher)  . CORONARY ANGIOPLASTY    . CORONARY ARTERY BYPASS GRAFT  03/07/2003   4v CABG ([place]) with LIMA to LAD, vein graft to RCA, 1st obtuse marginal, and ramus intermedius  . ESOPHAGOGASTRODUODENOSCOPY  10/08/2012   nl esophagus, duodenitis and erosive gastropathy, path -  gastropathy no Hpylori, no rpt rec  . LUMBAR EPIDURAL INJECTION  03/2017   L5/S1 ([place])  . PACEMAKER IMPLANT N/A 04/21/2018   Procedure: PACEMAKER IMPLANT -- Dual Chamber;  Surgeon: Deboraha Sprang, MD;  Location: [place] CV LAB;  Service: Cardiovascular;  Laterality: N/A;  . PERIPHERAL VASCULAR CATHETERIZATION N/A 02/02/2016   Procedure: Renal Angiography;  Surgeon: [name], MD;  Location: [place] CV LAB;  Service: Cardiovascular;  Laterality: N/A;  . PERIPHERAL VASCULAR CATHETERIZATION N/A 02/02/2016   Procedure: Abdominal Aortogram;  Surgeon: [name], MD;  Location: [facility] CV LAB;  Service: Cardiovascular;  Laterality: N/A;  . RENAL DOPPLER  11/29/2011   Celiac&SMA-demonstrated vessel narrowing suggestive of a greater than 50% diameter reduction. Bilateral renal arteries-demonstrated vessel narrowing of 60-99% diameter reduction. Rt Kidney-mid pole lateral simple cyst noted measuring 1.29x0.76x1.11cm and exophytic cyst outside lower pole measuring 1.23x0.96x1.31. Lft Kidney-lateral mid to lower pole simple cyst measuering-1.24x9.83x1.24  . RIGHT/LEFT HEART CATH AND CORONARY/GRAFT ANGIOGRAPHY N/A 10/20/2017   Procedure: RIGHT/LEFT HEART CATH AND CORONARY/GRAFT ANGIOGRAPHY;  Surgeon: Burnell Blanks, MD;  Location: [place] CV LAB;  Service: Cardiovascular;  Laterality: N/A;  . TEE WITHOUT CARDIOVERSION N/A 06/25/2015   Procedure: TRANSESOPHAGEAL ECHOCARDIOGRAM (TEE);  Surgeon: Pixie Casino, MD;  Location: [facility] ENDOSCOPY;  Service: Cardiovascular;  Laterality: N/A;  . TEE WITHOUT CARDIOVERSION N/A 11/02/2016   Procedure: TRANSESOPHAGEAL ECHOCARDIOGRAM (TEE);  Surgeon: [name], MD;  Location: [place];  Service: Cardiovascular;  Laterality: N/A;  . TEE WITHOUT CARDIOVERSION N/A 12/05/2018   Procedure: TRANSESOPHAGEAL ECHOCARDIOGRAM (TEE);  Surgeon: Sherren Mocha, MD;  Location: [place];  Service: Open Heart Surgery;  Laterality: N/A;  . TONSILLECTOMY     . TRANSCATHETER AORTIC VALVE REPLACEMENT, TRANSFEMORAL N/A 12/05/2018   Procedure: TRANSCATHETER AORTIC VALVE REPLACEMENT, TRANSFEMORAL;  Surgeon: Sherren Mocha, MD;  Location: [place];  Service: Open Heart Surgery;  Laterality: N/A;  . UPPER GASTROINTESTINAL ENDOSCOPY  2003   reflux esophagitis, erosive gastropathy, duodenal ulcer     Allergies  Allergies  Allergen Reactions  . Gabapentin Other (See Comments)    Leg pain, weakness   . Metoprolol Swelling  . Spironolactone Other (See Comments)    Painful gynecomastia  . Amlodipine Other (See Comments)    Edema  . Other Other (See Comments)    Horse serum - myalgias  . Rosiglitazone Maleate Other (See Comments)     Did not help  . Tricor [Fenofibrate] Other (See Comments)    myalgias    History of Present Illness    86 year old male with a history of HFpEF, CAD status post CABG x4 with 4 4 patent grafts by catheterization in January 2020, severe aortic stenosis status post TAVR in March 2020, paroxysmal atrial fibrillation/flutter status post remote cardioversion on chronic amiodarone and Eliquis, high-grade heart block status SJM post permanent pacemaker placement in July 2019, carotid stenosis status post remote left carotid endarterectomy, hypertension, hyperlipidemia,  diabetes, stage III chronic kidney disease, bilateral renal artery stenosis, COPD on home O2, and COVID-19 infection in December 2020.  He says that since his Covid infection and hospitalization, he has been weak and has not really ever fully recovered.  He is followed in the outpatient setting by both our structural heart team and advanced heart failure team in [place].  He was last seen in heart failure clinic in February, at which time he was doing well on torsemide 60 mg twice daily and metolazone 5 mg twice a week.  His weight at that time was 103.1 kg and echocardiogram that day showed normal LV function with mild mitral stenosis/mitral regurgitation/aortic  stenosis.  Follow-up lab work that day did show slight rise in BUN and creatinine and his metolazone dose was reduced to 5 mg once a week.  On March 17, he fell while attempting to get into a vehicle.  He struck his right knee and had significant pain, prompting him to present to the emergency department.  Here, lab work was relatively unremarkable though high-sensitivity troponin was evaluated and noted to be 54 (chronic elevation dating back to at least 05/2019) with a BNP of 248.  Imaging did not show any fracture or dislocation.  Recommendation was made for admission however patient requested discharge and was subsequently sent home.  Patient lives by himself but has family nearby.  He was seen via telemedicine visit by his primary care provider earlier this week with recommendation for home health and palliative care. Per patient, over the past 3 to 4 days he has been more profoundly weak.  He is somewhat confused as to what led to his hospitalization but says that he tried to get out of his chair and fell and was found by his son "in a heap" a short time after falling and potentially losing consciousness.  Patient's understanding is that he was then taken to the emergency department.  Per ER notes, since his March 17 ER visit, he has more or less spent all of his time in his recliner and has been soiling himself.   Because of ongoing weakness and inability to care for himself, his family brought him to the emergency department yesterday.  Regardless, in the emergency department he was found to have a slight rise in his high sensitive troponin above prior baseline to 110  116  143.  Total CK was on the higher side of normal at 216.  His BUN and creatinine were elevated above prior baseline at 68/2.22 (creatinine previously running 1.5-1.8).  He is chronic, stable normocytic anemia.  CT imaging of the head and cervical spine were unremarkable for acute findings.  Chest x-ray showed mild bibasilar subsegmental  atelectasis.  ECG showed atrial sensing with ventricular pacing at a rate of 93.  He was seen by internal medicine admitted and placed on IV fluids.  BUN and creatinine this morning are slightly improved at 62/1.94.  He has been hemodynamically stable and ventricularly paced on telemetry without any events.  He has chronic dyspnea on exertion which he says is unchanged.  He denies chest pain, dyspnea at rest, palpitations, PND, orthopnea, dizziness, edema, or early satiety.  He has not been weighing himself at home.  He does report compliance with home medications though this is less clear in the setting of immobility.  Inpatient Medications    . amiodarone  200 mg Oral Daily  . apixaban  2.5 mg Oral BID  . vitamin C  500 mg  Oral Daily  . atorvastatin  20 mg Oral Daily  . citalopram  10 mg Oral Daily  . finasteride  5 mg Oral Daily  . insulin aspart  0-20 Units Subcutaneous Q4H  . insulin glargine  15 Units Subcutaneous q morning - 10a   And  . insulin glargine  15 Units Subcutaneous QHS  . magnesium oxide  200 mg Oral Daily  . methylPREDNISolone (SOLU-MEDROL) injection  40 mg Intravenous Q8H  . pantoprazole  40 mg Oral Daily  . traZODone  50 mg Oral QHS    Family History    Family History  Problem Relation Age of Onset  . Hypertension Father   . Heart disease Father   . Cancer Mother        GI cancer  . Diabetes Sister   . Heart disease Sister   . Stroke Sister   . Hypertension Sister   . Cancer Brother        Lung cancer - nonsmoker  . Cancer Maternal Grandmother   . Heart attack Maternal Grandfather   . Heart attack Paternal Grandmother   . Heart attack Paternal Grandfather   . Cancer Brother        Pancreatic cancer  . Lung disease Brother    He indicated that his mother is deceased. He indicated that his father is deceased. He indicated that his sister is alive. He indicated that all of his three brothers are deceased. He indicated that his maternal grandmother is  deceased. He indicated that his maternal grandfather is deceased. He indicated that his paternal grandmother is deceased. He indicated that his paternal grandfather is deceased.   Social History    Social History   Socioeconomic History  . Marital status: Widowed    Spouse name: Not on file  . Number of children: Not on file  . Years of education: Not on file  . Highest education level: Not on file  Occupational History  . Not on file  Tobacco Use  . Smoking status: Former Smoker    Packs/day: 0.50    Years: 36.00    Pack years: 18.00    Types: Cigarettes    Quit date: 07/23/2013    Years since quitting: 6.4  . Smokeless tobacco: Never Used  Substance and Sexual Activity  . Alcohol use: No  . Drug use: No  . Sexual activity: Not Currently  Other Topics Concern  . Not on file  Social History Narrative   Caffeine: 4 cups coffee/day, some tea and soda   Lives in [place] by himself.  Family nearby and help out.   Occupation: Retired   Activity: no regular exercise   Diet: good water, fruits/vegetables daily   Social Determinants of Health   Financial Resource Strain: Low Risk   . Difficulty of Paying Living Expenses: Not hard at all  Food Insecurity: No Food Insecurity  . Worried About Charity fundraiser in the Last Year: Never true  . Ran Out of Food in the Last Year: Never true  Transportation Needs: No Transportation Needs  . Lack of Transportation (Medical): No  . Lack of Transportation (Non-Medical): No  Physical Activity:   . Days of Exercise per Week:   . Minutes of Exercise per Session:   Stress:   . Feeling of Stress :   Social Connections:   . Frequency of Communication with Friends and Family:   . Frequency of Social Gatherings with Friends and Family:   . Attends Religious Services:   .  Active Member of Clubs or Organizations:   . Attends Archivist Meetings:   [name] Marital Status:   Intimate Partner Violence:   . Fear of Current or  Ex-Partner:   . Emotionally Abused:   [name] Physically Abused:   . Sexually Abused:      Review of Systems    General:  No chills, fever, night sweats or weight changes.  Cardiovascular:  No chest pain, denies dyspnea at rest but does note dyspnea on exertion, no edema, orthopnea, palpitations, paroxysmal nocturnal dyspnea. Dermatological: No rash, lesions/masses Respiratory: No cough, +++ dyspnea on exertion. Urologic: No hematuria, dysuria Abdominal:   No nausea, vomiting, diarrhea, bright red blood per rectum, melena, or hematemesis Neurologic/psych:  No visual changes.  Generalized weakness as outlined above. Admits to being confused about events that led to his hospitalization. All other systems reviewed and are otherwise negative except as noted above.  Physical Exam    Blood pressure (!) 155/86, pulse 86, temperature 98.4 F (36.9 C), temperature source Oral, resp. rate 16, height 6\' 1"  (1.854 m), weight 104.1 kg, SpO2 98 %.  General: Pleasant, NAD Psych: Normal affect. Neuro: Alert and oriented X 3. Moves all extremities spontaneously. HEENT: Normal  Neck: Supple without bruits or JVD. Lungs:  Resp regular and unlabored, bibasilar crackles otherwise diminished breath sounds bilaterally. Heart: RRR no s3. + S4.  2/6 systolic murmur heard at the upper sternal borders and apex. Abdomen: Obese, soft, non-tender, non-distended, BS + x 4.  Extremities: No clubbing, cyanosis.  Trace bilateral ankle edema. DP/PT/Radials 2+ and equal bilaterally.  Labs    Cardiac Enzymes Recent Labs  Lab 12/19/19 1611 12/25/19 1508 12/25/19 1714 12/25/19 2236  TROPONINIHS 54* 110* 116* 143*      Lab Results  Component Value Date   WBC 7.4 12/26/2019   HGB 9.8 (L) 12/26/2019   HCT 31.1 (L) 12/26/2019   MCV 93.7 12/26/2019   PLT 182 12/26/2019    Recent Labs  Lab 12/25/19 1508 12/25/19 1508 12/26/19 0039  NA 138   < > 142  K 4.6   < > 4.0  CL 96*   < > 101  CO2 29   < > 31  BUN  68*   < > 62*  CREATININE 2.22*   < > 1.94*  CALCIUM 9.6   < > 9.1  PROT 7.9  --   --   BILITOT 1.0  --   --   ALKPHOS 75  --   --   ALT 13  --   --   AST 21  --   --   GLUCOSE 327*   < > 224*   < > = values in this interval not displayed.   Lab Results  Component Value Date   CHOL 123 09/21/2019   HDL 34 (A) 09/21/2019   LDLCALC 72 09/21/2019   TRIG 87 09/21/2019   Lab Results  Component Value Date   DDIMER 1.20 (H) 09/11/2019     Radiology Studies    DG Knee 2 Views Right  Result Date: 12/19/2019 CLINICAL DATA:  Right knee pain after fall today EXAM: RIGHT KNEE - 1-2 VIEW COMPARISON:  None. FINDINGS: No fracture or dislocation. Small suprapatellar right knee joint effusion. Moderate tricompartmental right knee osteoarthritis. Surgical clips throughout the medial right knee soft tissues. Prominent vascular calcifications in the posterior soft tissues. IMPRESSION: No fracture or dislocation. Small suprapatellar right knee joint effusion. Moderate tricompartmental right knee osteoarthritis. Electronically Signed  By: Ilona Sorrel M.D.   On: 12/19/2019 16:47   CT Head Wo Contrast  Result Date: 12/25/2019 CLINICAL DATA:  86 year old male with head trauma. EXAM: CT HEAD WITHOUT CONTRAST CT CERVICAL SPINE WITHOUT CONTRAST TECHNIQUE: Multidetector CT imaging of the head and cervical spine was performed following the standard protocol without intravenous contrast. Multiplanar CT image reconstructions of the cervical spine were also generated. COMPARISON:  None. FINDINGS: CT HEAD FINDINGS Brain: There is moderate age-related atrophy and chronic microvascular ischemic changes. Patchy area of white matter hypodensity in the right posterior parietal convexity, likely chronic microvascular ischemic changes. 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 There is no acute intracranial hemorrhage. No mass effect or midline shift. No extra-axial fluid collection. Vascular: No  hyperdense vessel or unexpected calcification. Skull: Normal. Negative for fracture or focal lesion. Sinuses/Orbits: No acute finding. Other: None CT CERVICAL SPINE FINDINGS Alignment: No acute subluxation. Skull base and vertebrae: No acute fracture. Osteopenia. Soft tissues and spinal canal: No prevertebral fluid or swelling. No visible canal hematoma. Disc levels: Multilevel degenerative changes with endplate irregularity and disc space narrowing and bone spurring and osteophyte. Upper chest: Negative. Other: Bilateral carotid bulb calcified plaques. Partially visualized pacemaker wire. IMPRESSION: 1. No acute intracranial hemorrhage. 2. Age-related atrophy and chronic microvascular ischemic changes. 3. No acute/traumatic cervical spine pathology. Electronically Signed   By: Anner Crete M.D.   On: 12/25/2019 16:15   CT Cervical Spine Wo Contrast  Result Date: 12/25/2019 CLINICAL DATA:  86 year old male with head trauma. EXAM: CT HEAD WITHOUT CONTRAST CT CERVICAL SPINE WITHOUT CONTRAST TECHNIQUE: Multidetector CT imaging of the head and cervical spine was performed following the standard protocol without intravenous contrast. Multiplanar CT image reconstructions of the cervical spine were also generated. COMPARISON:  None. FINDINGS: CT HEAD FINDINGS Brain: There is moderate age-related atrophy and chronic microvascular ischemic changes. Patchy area of white matter hypodensity in the right posterior parietal convexity, likely chronic microvascular ischemic changes. 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 There is no acute intracranial hemorrhage. No mass effect or midline shift. No extra-axial fluid collection. Vascular: No hyperdense vessel or unexpected calcification. Skull: Normal. Negative for fracture or focal lesion. Sinuses/Orbits: No acute finding. Other: None CT CERVICAL SPINE FINDINGS Alignment: No acute subluxation. Skull base and vertebrae: No acute fracture.  Osteopenia. Soft tissues and spinal canal: No prevertebral fluid or swelling. No visible canal hematoma. Disc levels: Multilevel degenerative changes with endplate irregularity and disc space narrowing and bone spurring and osteophyte. Upper chest: Negative. Other: Bilateral carotid bulb calcified plaques. Partially visualized pacemaker wire. IMPRESSION: 1. No acute intracranial hemorrhage. 2. Age-related atrophy and chronic microvascular ischemic changes. 3. No acute/traumatic cervical spine pathology. Electronically Signed   By: Anner Crete M.D.   On: 12/25/2019 16:15   DG Chest Portable 1 View  Result Date: 12/25/2019 CLINICAL DATA:  Fall. EXAM: PORTABLE CHEST 1 VIEW COMPARISON:  September 06, 2019. FINDINGS: Stable cardiomegaly. Status post coronary bypass graft. Left-sided pacemaker is unchanged in position. No pneumothorax is noted. Elevated left hemidiaphragm is noted. Mild bibasilar subsegmental atelectasis is noted. Bony thorax is unremarkable. IMPRESSION: Mild bibasilar subsegmental atelectasis. Aortic Atherosclerosis (ICD10-I70.0). Electronically Signed   By: Marijo Conception M.D.   On: 12/25/2019 16:29   DG Knee Complete 4 Views Right  Result Date: 12/25/2019 CLINICAL DATA:  Pain following fall EXAM: RIGHT KNEE - COMPLETE 4+ VIEW COMPARISON:  December 19, 2019 FINDINGS: Frontal, lateral, and  bilateral oblique views were obtained. Bones appear somewhat osteoporotic. There is no evident fracture or dislocation. The small joint effusion noted on recent prior study has nearly completely resolved. There is generalized joint space narrowing with spurring in all compartments, stable. No erosive change. There is extensive arterial vascular calcification in the popliteal, distal superficial femoral, and proximal trifurcation arterial vessels. IMPRESSION: Interval near complete resolution of small joint effusion. No fracture or dislocation. Generalized osteoarthritic change, stable. Bones osteoporotic.  Extensive arterial vascular calcification noted. Electronically Signed   By: Lowella Grip III M.D.   On: 12/25/2019 16:29   CT Renal Stone Study  Result Date: 12/25/2019 CLINICAL DATA:  Status post fall. EXAM: CT ABDOMEN AND PELVIS WITHOUT CONTRAST TECHNIQUE: Multidetector CT imaging of the abdomen and pelvis was performed following the standard protocol without IV contrast. COMPARISON:  August 10, 2019 FINDINGS: Lower chest: Multiple sternal wires are seen. An artificial aortic valve is present. There is marked severity coronary artery calcification. Mild to moderate severity atelectasis and/or infiltrate is seen within the bilateral lung bases. Hepatobiliary: A punctate calcified granuloma is seen within the right lobe of the liver. No gallstones, gallbladder wall thickening, or biliary dilatation. Pancreas: Unremarkable. No pancreatic ductal dilatation or surrounding inflammatory changes. Spleen: Normal in size without focal abnormality. Adrenals/Urinary Tract: A 1.4 cm low-attenuation right adrenal mass is seen. The left adrenal gland is normal in appearance. Kidneys are normal in size, without renal calculi or hydronephrosis. Multiple small bilateral renal cysts are seen. Bladder is unremarkable. Stomach/Bowel: Stomach is within normal limits. The appendix is not identified. No evidence of bowel wall thickening, distention, or inflammatory changes. Noninflamed diverticula are seen throughout the descending and sigmoid colon. Vascular/Lymphatic: Marked severity aortic calcification. No enlarged abdominal or pelvic lymph nodes. Reproductive: The prostate gland is mildly enlarged. A moderate to marked amount of prostate gland calcification is seen. There is a moderate size left-sided hydrocele which extends superiorly into the left inguinal region. Other: No abdominal wall hernia or abnormality. No abdominopelvic ascites. Musculoskeletal: A chronic appearing compression fracture deformity is seen at the  level of the L1 vertebral body. Marked severity multilevel degenerative changes are noted throughout the lumbar spine. IMPRESSION: 1. Mild to moderate severity bibasilar atelectasis and/or infiltrate. 2. Diverticulosis without evidence of diverticulitis. 3. Moderate sized left-sided hydrocele which extends superiorly into the left inguinal region. 4. Marked severity coronary artery calcification. Aortic Atherosclerosis (ICD10-I70.0). Electronically Signed   By: Virgina Norfolk M.D.   On: 12/25/2019 16:27    ECG & Cardiac Imaging    Atrial sensed, ventricularly paced at 93 bpm - personally reviewed.  Assessment & Plan    1.  Coronary artery disease/elevated high-sensitivity troponin (chronic): Patient with a history of CAD status post four-vessel bypass with 4/4 patent grafts and most recent catheterization in early 2019.  Most recent echocardiogram in February showed normal LV function.  He is sedentary at home and has not experienced chest pain recently.  He has chronic dyspnea on exertion in the setting of COPD on home O2.  He has struggled with generalized weakness since his COVID-19 infection December 2020.  He presented to the emergency department on March 23, after being found by family in his recliner for what they presume had been several days.  Patient believes that he may have had a syncopal spell at some point but he is very unclear as to what led to his admission.  In the emergency department, he was found to have a slight rise in  BUN and creatinine above baseline at 68/2.22.  His CK was on the higher side of normal at 216.  High-sensitivity troponin was above prior baseline at 110 followed by 116 and then 143.  ECG is V paced.  He is pain-free this morning and denies any dyspnea at rest.  Echocardiogram is pending.  At this time, high-sensitivity troponin elevation does not appear to be secondary to ACS and is more likely due to demand ischemia in the setting of prerenal azotemia, COPD, and  perhaps mild rhabdo.  Continue statin therapy.  No aspirin in the setting of chronic Eliquis.  No plans for ischemic evaluation at this time.  2.  HFpEF: Echo in February showed normal LV function with mild mitral stenosis/regurgitation and normally functioning bioprosthetic aortic valve with mild increase in mean gradient to 11 mmHg.  Admission chest x-ray without any significant edema.  He has not been weighing himself at home and weight this morning is 0.5 kg above weight and heart failure clinic on February 8.  He has been receiving IV fluids in the setting of acute kidney injury.  Further, torsemide and lisinopril have been on hold.  I am going to discontinue IV fluids as he does have bibasilar crackles currently.  We will plan to resume home doses of torsemide (60 mg twice daily) and metolazone 5 mg once a week within the next 24 hours, as he had done well on this previously.   3.  Severe aortic stenosis status post TAVR: Mild increases in mean and peak gradient on echo in February (11 and 23 mmHg respectively).  Overall stable function.  4.  Essential hypertension: Blood pressure currently elevated in the setting of ongoing IV fluids and absence of usual diuretics and lisinopril.  Will KVO IV fluids and provided that renal function stable tomorrow, plan to resume torsemide and lisinopril.  5.  Hyperlipidemia: Continue statin therapy.  LDL 72 in December 2020.  6.  Paroxysmal atrial fibrillation/flutter: Status post remote cardioversion.  Maintaining sinus rhythm on chronic amiodarone.  Anticoagulated with Eliquis.  In setting of frequent falls recently, we may need to reconsider long-term utilization of oral anticoagulation.  7.  Type 2 diabetes mellitus: Insulin therapy per internal medicine.  8.  COPD: On home O2.  Currently receiving steroids/Abx.  Not actively wheezing.  Watch for volume overload in the setting of steroids.  9.  Acute on chronic CKD III:  Creat sl better this AM.  Follow.   Lisinopril and torsemide currently on hold.  10.  Deconditioning: Patient fell March 17 and per family report, has been in his recliner ever since.  Mobility is severely restricted at home in the setting of weakness and significant dyspnea on exertion with chronic respiratory failure requiring home O2.  Patient would benefit from PT/OT eval to determine his ability to care for himself at home going forward and potential requirement for skilled nursing.  Signed, Murray Hodgkins, NP 12/26/2019, 3:21 PM  For questions or updates, please contact   Please consult [email or web address] for contact info under Cardiology/STEMI.

## 2019-12-26 NOTE — Telephone Encounter (Signed)
Spoke with patient's daughter-in-law [name] to offer to schedule the Palliative Consult, [name] stated that patient has been admitted to hospital as of yesterday.  Told daughter-in-law that I would notify Hospital Liaison for [place] to follow his disposition.

## 2019-12-26 NOTE — Progress Notes (Signed)
Patient has a pending referral for [facility] community Palliative program at home. He was admitted to [facility] prior to appointment being made. TOC Bridgett Cobb made aware. Flo Shanks BSN, RN, [place] [phone]

## 2019-12-26 NOTE — Progress Notes (Signed)
PROGRESS NOTE    Charles [name]  [id number] DOB: 09-14-1932 DOA: 12/25/2019 PCP: [name], MD       Assessment & Plan:   Active Problems:   COPD exacerbation (HCC)   Pressure injury of skin   AKI on CKD stage IIIb: Cr is trending down today. Will continue on IVFs. Avoid nephrotoxic meds   COPD exacerbation: continue on IV steroids & duonebs. Encourage incentive spirometry   Elevated troponins: trending up. Cardio consulted   DM2: uncontrolled. Continue on SSI w/ accuchecks  Atrial flutter: continue amiodarone and eliquis. Continue on tele  Dyslipidemia: continue on statin  BPH: continue on proscar   Depression: continue celexa.  Generalized weakness: PT/OT consulted    DVT prophylaxis: eliquis Code Status: DNR  Family Communication:  Discussed pt's care w/ pt's daughter in law, [name] and answered her questions. Pt's PCP recently ordered the pt to have hospital bed at home as well as palliative care  Disposition Plan: will likely d/c home w/ palliative care once medically stable    Consultants:   cardio  Procedures:   Antimicrobials:    Subjective: Pt c/o shortness of breath  Objective: Vitals:   12/25/19 2232 12/26/19 0129 12/26/19 0615 12/26/19 0802  BP: (!) 155/77 (!) 162/82 (!) 112/58 (!) 154/85  Pulse: 91 96 79 84  Resp: 19  20 18   Temp: 98.3 F (36.8 C) 98.5 F (36.9 C) 98.4 F (36.9 C) 98.5 F (36.9 C)  TempSrc: Oral Oral Oral Oral  SpO2: 98% 100% 100% 99%  Weight: 103.6 kg  104.1 kg   Height: 6\' 1"  (1.854 m)       Intake/Output Summary (Last 24 hours) at 12/26/2019 0818 Last data filed at 12/26/2019 [id number] Gross per 24 hour  Intake 556.87 ml  Output 200 ml  Net 356.87 ml   Filed Weights   12/25/19 1434 12/25/19 2232 12/26/19 0615  Weight: 106.6 kg 103.6 kg 104.1 kg    Examination:  General exam: Appears lethargic Respiratory system: diminished breath sounds b/l. Cardiovascular system: S1 & S2+. No rubs,  gallops or clicks.  Gastrointestinal system: Abdomen is nondistended, soft and nontender. Normal bowel sounds heard. Central nervous system: Lethargic. Moves all 4 extremities Psychiatry: flat mood and affect     Data Reviewed: I have personally reviewed following labs and imaging studies  CBC: Recent Labs  Lab 12/19/19 1611 12/25/19 1508 12/26/19 0039  WBC 8.2 8.1 7.4  NEUTROABS 6.0 6.3  --   HGB 10.1* 10.5* 9.8*  HCT 31.8* 32.8* 31.1*  MCV 92.4 92.4 93.7  PLT 176 179 [id number]   Basic Metabolic Panel: Recent Labs  Lab 12/19/19 1611 12/25/19 1508 12/26/19 0039  NA 136 138 142  K 4.4 4.6 4.0  CL 94* 96* 101  CO2 31 29 31   GLUCOSE 300* 327* 224*  BUN 44* 68* 62*  CREATININE 1.96* 2.22* 1.94*  CALCIUM 9.2 9.6 9.1   GFR: Estimated Creatinine Clearance: 35.3 mL/min (A) (by C-G formula based on SCr of 1.94 mg/dL (H)). Liver Function Tests: Recent Labs  Lab 12/19/19 1611 12/25/19 1508  AST 19 21  ALT 15 13  ALKPHOS 76 75  BILITOT 0.6 1.0  PROT 7.5 7.9  ALBUMIN 3.9 4.0   No results for input(s): LIPASE, AMYLASE in the last 168 hours. No results for input(s): AMMONIA in the last 168 hours. Coagulation Profile: No results for input(s): INR, PROTIME in the last 168 hours. Cardiac Enzymes: Recent Labs  Lab 12/25/19 1508  CKTOTAL 216  BNP (last 3 results) Recent Labs    01/26/19 0000 10/08/19 1151  PROBNP 1,162* 2,349*   HbA1C: No results for input(s): HGBA1C in the last 72 hours. CBG: Recent Labs  Lab 12/26/19 0053 12/26/19 0425  GLUCAP 222* 86   Lipid Profile: No results for input(s): CHOL, HDL, LDLCALC, TRIG, CHOLHDL, LDLDIRECT in the last 72 hours. Thyroid Function Tests: Recent Labs    12/25/19 1508  TSH 3.672  FREET4 1.61*   Anemia Panel: No results for input(s): VITAMINB12, FOLATE, FERRITIN, TIBC, IRON, RETICCTPCT in the last 72 hours. Sepsis Labs: No results for input(s): PROCALCITON, LATICACIDVEN in the last 168 hours.  No results  found for this or any previous visit (from the past 240 hour(s)).       Radiology Studies: CT Head Wo Contrast  Result Date: 12/25/2019 CLINICAL DATA:  86 year old male with head trauma. EXAM: CT HEAD WITHOUT CONTRAST CT CERVICAL SPINE WITHOUT CONTRAST TECHNIQUE: Multidetector CT imaging of the head and cervical spine was performed following the standard protocol without intravenous contrast. Multiplanar CT image reconstructions of the cervical spine were also generated. COMPARISON:  None. FINDINGS: CT HEAD FINDINGS Brain: There is moderate age-related atrophy and chronic microvascular ischemic changes. Patchy area of white matter hypodensity in the right posterior parietal convexity, likely chronic microvascular ischemic changes. 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 There is no acute intracranial hemorrhage. No mass effect or midline shift. No extra-axial fluid collection. Vascular: No hyperdense vessel or unexpected calcification. Skull: Normal. Negative for fracture or focal lesion. Sinuses/Orbits: No acute finding. Other: None CT CERVICAL SPINE FINDINGS Alignment: No acute subluxation. Skull base and vertebrae: No acute fracture. Osteopenia. Soft tissues and spinal canal: No prevertebral fluid or swelling. No visible canal hematoma. Disc levels: Multilevel degenerative changes with endplate irregularity and disc space narrowing and bone spurring and osteophyte. Upper chest: Negative. Other: Bilateral carotid bulb calcified plaques. Partially visualized pacemaker wire. IMPRESSION: 1. No acute intracranial hemorrhage. 2. Age-related atrophy and chronic microvascular ischemic changes. 3. No acute/traumatic cervical spine pathology. Electronically Signed   By: Anner Crete M.D.   On: 12/25/2019 16:15   CT Cervical Spine Wo Contrast  Result Date: 12/25/2019 CLINICAL DATA:  86 year old male with head trauma. EXAM: CT HEAD WITHOUT CONTRAST CT CERVICAL SPINE WITHOUT CONTRAST  TECHNIQUE: Multidetector CT imaging of the head and cervical spine was performed following the standard protocol without intravenous contrast. Multiplanar CT image reconstructions of the cervical spine were also generated. COMPARISON:  None. FINDINGS: CT HEAD FINDINGS Brain: There is moderate age-related atrophy and chronic microvascular ischemic changes. Patchy area of white matter hypodensity in the right posterior parietal convexity, likely chronic microvascular ischemic changes. 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 There is no acute intracranial hemorrhage. No mass effect or midline shift. No extra-axial fluid collection. Vascular: No hyperdense vessel or unexpected calcification. Skull: Normal. Negative for fracture or focal lesion. Sinuses/Orbits: No acute finding. Other: None CT CERVICAL SPINE FINDINGS Alignment: No acute subluxation. Skull base and vertebrae: No acute fracture. Osteopenia. Soft tissues and spinal canal: No prevertebral fluid or swelling. No visible canal hematoma. Disc levels: Multilevel degenerative changes with endplate irregularity and disc space narrowing and bone spurring and osteophyte. Upper chest: Negative. Other: Bilateral carotid bulb calcified plaques. Partially visualized pacemaker wire. IMPRESSION: 1. No acute intracranial hemorrhage. 2. Age-related atrophy and chronic microvascular ischemic changes. 3. No acute/traumatic cervical spine pathology. Electronically Signed   By: Anner Crete  M.D.   On: 12/25/2019 16:15   DG Chest Portable 1 View  Result Date: 12/25/2019 CLINICAL DATA:  Fall. EXAM: PORTABLE CHEST 1 VIEW COMPARISON:  September 06, 2019. FINDINGS: Stable cardiomegaly. Status post coronary bypass graft. Left-sided pacemaker is unchanged in position. No pneumothorax is noted. Elevated left hemidiaphragm is noted. Mild bibasilar subsegmental atelectasis is noted. Bony thorax is unremarkable. IMPRESSION: Mild bibasilar subsegmental  atelectasis. Aortic Atherosclerosis (ICD10-I70.0). Electronically Signed   By: Marijo Conception M.D.   On: 12/25/2019 16:29   DG Knee Complete 4 Views Right  Result Date: 12/25/2019 CLINICAL DATA:  Pain following fall EXAM: RIGHT KNEE - COMPLETE 4+ VIEW COMPARISON:  December 19, 2019 FINDINGS: Frontal, lateral, and bilateral oblique views were obtained. Bones appear somewhat osteoporotic. There is no evident fracture or dislocation. The small joint effusion noted on recent prior study has nearly completely resolved. There is generalized joint space narrowing with spurring in all compartments, stable. No erosive change. There is extensive arterial vascular calcification in the popliteal, distal superficial femoral, and proximal trifurcation arterial vessels. IMPRESSION: Interval near complete resolution of small joint effusion. No fracture or dislocation. Generalized osteoarthritic change, stable. Bones osteoporotic. Extensive arterial vascular calcification noted. Electronically Signed   By: Lowella Grip III M.D.   On: 12/25/2019 16:29   CT Renal Stone Study  Result Date: 12/25/2019 CLINICAL DATA:  Status post fall. EXAM: CT ABDOMEN AND PELVIS WITHOUT CONTRAST TECHNIQUE: Multidetector CT imaging of the abdomen and pelvis was performed following the standard protocol without IV contrast. COMPARISON:  August 10, 2019 FINDINGS: Lower chest: Multiple sternal wires are seen. An artificial aortic valve is present. There is marked severity coronary artery calcification. Mild to moderate severity atelectasis and/or infiltrate is seen within the bilateral lung bases. Hepatobiliary: A punctate calcified granuloma is seen within the right lobe of the liver. No gallstones, gallbladder wall thickening, or biliary dilatation. Pancreas: Unremarkable. No pancreatic ductal dilatation or surrounding inflammatory changes. Spleen: Normal in size without focal abnormality. Adrenals/Urinary Tract: A 1.4 cm low-attenuation right  adrenal mass is seen. The left adrenal gland is normal in appearance. Kidneys are normal in size, without renal calculi or hydronephrosis. Multiple small bilateral renal cysts are seen. Bladder is unremarkable. Stomach/Bowel: Stomach is within normal limits. The appendix is not identified. No evidence of bowel wall thickening, distention, or inflammatory changes. Noninflamed diverticula are seen throughout the descending and sigmoid colon. Vascular/Lymphatic: Marked severity aortic calcification. No enlarged abdominal or pelvic lymph nodes. Reproductive: The prostate gland is mildly enlarged. A moderate to marked amount of prostate gland calcification is seen. There is a moderate size left-sided hydrocele which extends superiorly into the left inguinal region. Other: No abdominal wall hernia or abnormality. No abdominopelvic ascites. Musculoskeletal: A chronic appearing compression fracture deformity is seen at the level of the L1 vertebral body. Marked severity multilevel degenerative changes are noted throughout the lumbar spine. IMPRESSION: 1. Mild to moderate severity bibasilar atelectasis and/or infiltrate. 2. Diverticulosis without evidence of diverticulitis. 3. Moderate sized left-sided hydrocele which extends superiorly into the left inguinal region. 4. Marked severity coronary artery calcification. Aortic Atherosclerosis (ICD10-I70.0). Electronically Signed   By: Virgina Norfolk M.D.   On: 12/25/2019 16:27        Scheduled Meds: . amiodarone  200 mg Oral Daily  . apixaban  2.5 mg Oral BID  . vitamin C  500 mg Oral Daily  . atorvastatin  20 mg Oral Daily  . citalopram  10 mg Oral Daily  . finasteride  5 mg Oral Daily  . insulin aspart  0-20 Units Subcutaneous Q4H  . insulin glargine  15 Units Subcutaneous q morning - 10a   And  . insulin glargine  15 Units Subcutaneous QHS  . magnesium oxide  200 mg Oral Daily  . methylPREDNISolone (SOLU-MEDROL) injection  40 mg Intravenous Q8H  .  pantoprazole  40 mg Oral Daily  . traZODone  50 mg Oral QHS   Continuous Infusions: . sodium chloride 100 mL/hr at 12/26/19 0459     LOS: 1 day    Time spent: 33 mins    Wyvonnia Dusky, MD [facility] Pager [phone]  If 7PM-7AM, please contact night-coverage [email or web address] 12/26/2019, 8:18 AM

## 2019-12-26 NOTE — Progress Notes (Signed)
Inpatient Diabetes Program Recommendations  AACE/ADA: New Consensus Statement on Inpatient Glycemic Control (2015)  Target Ranges:  Prepandial:   less than 140 mg/dL      Peak postprandial:   less than 180 mg/dL (1-2 hours)      Critically ill patients:  140 - 180 mg/dL   Lab Results  Component Value Date   GLUCAP 93 12/26/2019   HGBA1C 8.4 (H) 12/26/2019    Review of Glycemic Control Results for CAMARION, DILLOW (MRN [id number]) as of 12/26/2019 10:39  Ref. Range 12/26/2019 00:53 12/26/2019 04:25 12/26/2019 08:32 12/26/2019 09:05  Glucose-Capillary Latest Ref Range: 70 - 99 mg/dL 222 (H) 86 58 (L) 93   Diabetes history: DM 2 Outpatient Diabetes medications:  Lantus 22 units q AM and Lantus 20 units q PM- Metformin 500 mg bid Current orders for Inpatient glycemic control:  Lantus 15 units bid, Novolog resistant q 4 hours Solumedrol 40 mg IV q 8 hours  Inpatient Diabetes Program Recommendations:    Note that patient received Novolog 7 units at 0108 am and Novolog 12 units at 0107 am?  Note low blood sugar this AM.    Consider reducing Novolog correction to moderate tid with meals and HS.  May also need meal coverage if post-prandial blood sugars increased.   Thanks  Adah Perl, RN, BC-ADM Inpatient Diabetes Coordinator Pager [phone] (8a-5p)

## 2019-12-26 NOTE — H&P (Addendum)
[facility] [place] [facility] [place] NAME: Charles [name]    MR#:  [id number]  DATE OF BIRTH:  [birth year]  DATE OF ADMISSION:  12/25/2019  PRIMARY CARE PHYSICIAN: Charles Bush, MD   REQUESTING/REFERRING PHYSICIAN: Marjean Donna, MD CHIEF COMPLAINT:   Chief Complaint  Patient presents with  . Weakness    HISTORY OF PRESENT ILLNESS:  Charles [name]  is a 86 y.o. Caucasian male with a known history of multiple medical problems that are mentioned below, who presented to the emergency room with a concern of recent worsening of dyspnea as well as wheezing but cough that has been mainly dry without chest [name] or palpitations.  He has been having recent diminished appetite.  No fever or chills.  No nausea or vomiting or abdominal [name].  No headache or dizziness or blurred vision.  He had a recent fall 5 days ago with subsequent right knee contusion.  His ambulation has been declining since then.  No dysuria, oliguria or hematuria or flank [name].  On presentation to the emergency room, blood pressure was 138/58 with pulse currently is 98% on 3 L of O2 by nasal cannula and otherwise normal vital signs.  Labs revealed a blood glucose of 327 and a BUN of 68 and creatinine of 2.22 compared to 44 and 1.96 on 12/19/2019.  CK was 216 and high-sensitivity troponin I was 110 and later 116 1043.  CBC showed anemia close to previous levels and urinalysis was unremarkable.  TSH was 3.67 with a free T4 of 1.61 and alcohol level less than 10.  Chest x-ray showed aortic atherosclerosis with mild  bibasal subsegmental atelectasis.  The x-ray showed interval near complete resolution of small joint effusion with no fracture or dislocation and generalized osteoarthritic change that is stable with osteoporotic bones.  EKG showed AV paced rhythm with a rate of 94.  The patient was given 1.5 L of IV normal saline bolus.  He will be admitted to a telemetry bed for further evaluation and management. PAST MEDICAL  HISTORY:   Past Medical History:  Diagnosis Date  . Abdominal aortic atherosclerosis ([place])    by xray  . Arthritis    in lower back (06/24/2015)  . Atrial flutter ([place])    notes 06/24/2015  . AV block, [place] 1 09/06/2017   Charles [name] 09/06/2017  . 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 CAD (coronary artery disease) 2004   a. s/p CABG in 2004 with LIMA-LAD, SVG-RCA, SVG-OM, and SVG-RI  . Carotid stenosis 1999   s/p L CEA  . Chronic diastolic CHF (congestive heart failure) ([place])   . Colon polyp 2005   (Dr. Tiffany [name])  . Compression fracture of L1 lumbar vertebra (HCC) remote  . COPD (chronic obstructive pulmonary disease) ([place]) 03/2011   by xray  . COVID-19 09/06/2019  . Depression    hx  . GERD (gastroesophageal reflux disease) 2003   h/o duodenal ulcer per EGD as well as esophagitis  . History of colon polyps 2003, 2005   adenomatous Charles [name])  . History of tobacco abuse   . HLD (hyperlipidemia)   . HTN (hypertension)   . Pneumonia due to COVID-19 virus 09/07/2019   Hospitalization 09/2019  . Presence of permanent cardiac pacemaker   . Psoriasis   . Renal artery stenosis ([place]) 2004   70% bilateral, followed by cards  . 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 Severe aortic stenosis   . T2DM (type 2 diabetes mellitus) ([place]) 1995  . Urge incontinence of urine     PAST SURGICAL HISTORY:   Past Surgical History:  Procedure Laterality Date  . CARDIAC CATHETERIZATION  03/06/2003   No intervention - recommend CABG  . CARDIOVASCULAR STRESS TEST  11/2010   normal perfusion, no evidence of ischemia, EF 62% post exercise  . CARDIOVASCULAR STRESS TEST  11/28/2012   Mild diaphragmatic attenuation; cannot exclude a focal region of nontransmural inferior scar  . CARDIOVERSION N/A 06/25/2015   Procedure: CARDIOVERSION;  Surgeon: Charles Casino, MD;   Location: [facility] ENDOSCOPY;  Service: Cardiovascular;  Laterality: N/A;  . CARDIOVERSION N/A 11/02/2016   Procedure: CARDIOVERSION;  Surgeon: Charles Latch, MD;  Location: [place];  Service: Cardiovascular;  Laterality: N/A;  . CARDIOVERSION N/A 03/20/2019   Procedure: CARDIOVERSION;  Surgeon: Charles Pain, MD;  Location: [place];  Service: Cardiovascular;  Laterality: N/A;  . CAROTID ENDARTERECTOMY Left 1999   (Charles [name])  . CATARACT EXTRACTION W/ INTRAOCULAR LENS  IMPLANT, BILATERAL Bilateral 01/2013   Charles [name]  . COLONOSCOPY  2003   colon polyp x3 - adenomatous Charles [name])  . COLONOSCOPY  10/08/2012   2 TA, diverticulosis, int hem, no rpt rec Charles [name])  . CORONARY ANGIOPLASTY    . CORONARY ARTERY BYPASS GRAFT  03/07/2003   4v CABG (Charles [name]) with LIMA to LAD, vein graft to RCA, 1st obtuse marginal, and ramus intermedius  . ESOPHAGOGASTRODUODENOSCOPY  10/08/2012   nl esophagus, duodenitis and erosive gastropathy, path - gastropathy no Hpylori, no rpt rec  . LUMBAR EPIDURAL INJECTION  03/2017   L5/S1 (Charles [name])  . PACEMAKER IMPLANT N/A 04/21/2018   Procedure: PACEMAKER IMPLANT -- Dual Chamber;  Surgeon: Charles Sprang, MD;  Location: [place] CV LAB;  Service: Cardiovascular;  Laterality: N/A;  . PERIPHERAL VASCULAR CATHETERIZATION N/A 02/02/2016   Procedure: Renal Angiography;  Surgeon: Charles Harp, MD;  Location: [place] CV LAB;  Service: Cardiovascular;  Laterality: N/A;  . PERIPHERAL VASCULAR CATHETERIZATION N/A 02/02/2016   Procedure: Abdominal Aortogram;  Surgeon: Charles Harp, MD;  Location: [place] CV LAB;  Service: Cardiovascular;  Laterality: N/A;  . RENAL DOPPLER  11/29/2011   Celiac&SMA-demonstrated vessel narrowing suggestive of a greater than 50% diameter reduction. Bilateral renal arteries-demonstrated vessel narrowing of 60-99% diameter reduction. Rt Kidney-mid pole lateral simple cyst noted measuring 1.29x0.76x1.11cm and exophytic cyst outside lower pole measuring  1.23x0.96x1.31. Lft Kidney-lateral mid to lower pole simple cyst measuering-1.24x9.83x1.24  . RIGHT/LEFT HEART CATH AND CORONARY/GRAFT ANGIOGRAPHY N/A 10/20/2017   Procedure: RIGHT/LEFT HEART CATH AND CORONARY/GRAFT ANGIOGRAPHY;  Surgeon: Burnell Blanks, MD;  Location: [place] CV LAB;  Service: Cardiovascular;  Laterality: N/A;  . TEE WITHOUT CARDIOVERSION N/A 06/25/2015   Procedure: TRANSESOPHAGEAL ECHOCARDIOGRAM (TEE);  Surgeon: Charles Casino, MD;  Location: [facility] ENDOSCOPY;  Service: Cardiovascular;  Laterality: N/A;  . TEE WITHOUT CARDIOVERSION N/A 11/02/2016   Procedure: TRANSESOPHAGEAL ECHOCARDIOGRAM (TEE);  Surgeon: Charles Latch, MD;  Location: [place];  Service: Cardiovascular;  Laterality: N/A;  . TEE WITHOUT CARDIOVERSION N/A 12/05/2018   Procedure: TRANSESOPHAGEAL ECHOCARDIOGRAM (TEE);  Surgeon: Sherren Mocha, MD;  Location: [place];  Service: Open Heart Surgery;  Laterality: N/A;  . TONSILLECTOMY    . TRANSCATHETER AORTIC VALVE REPLACEMENT, TRANSFEMORAL N/A 12/05/2018   Procedure: TRANSCATHETER AORTIC VALVE REPLACEMENT, TRANSFEMORAL;  Surgeon: Sherren Mocha, MD;  Location: [place];  Service: Open Heart Surgery;  Laterality: N/A;  . UPPER GASTROINTESTINAL ENDOSCOPY  2003   reflux esophagitis, erosive gastropathy, duodenal ulcer    SOCIAL HISTORY:   Social History   Tobacco Use  . Smoking status: Former Smoker    Packs/day: 0.50    Years: 36.00    Pack years: 18.00    Types: Cigarettes    Quit date: 07/23/2013    Years since quitting: 6.4  . Smokeless tobacco: Never Used  Substance Use Topics  . Alcohol use: No    FAMILY HISTORY:   Family History  Problem Relation Age of Onset  . Hypertension Father   . Heart disease Father   . [name] Mother        GI [name]  . Diabetes Sister   . Heart disease Sister   . Stroke Sister   . Hypertension Sister   . [name] Brother        Lung [name] - nonsmoker  . [name] Maternal Grandmother   . Heart attack  Maternal Grandfather   . Heart attack Paternal Grandmother   . Heart attack Paternal Grandfather   . [name] Brother        Pancreatic [name]  . Lung disease Brother     DRUG ALLERGIES:   Allergies  Allergen Reactions  . Gabapentin Other (See Comments)    Leg [name], weakness   . Metoprolol Swelling  . Spironolactone Other (See Comments)    Painful gynecomastia  . Amlodipine Other (See Comments)    Edema  . Other Other (See Comments)    Horse serum - myalgias  . Rosiglitazone Maleate Other (See Comments)     Did not help  . Tricor [Fenofibrate] Other (See Comments)    myalgias    REVIEW OF SYSTEMS:   ROS As per history of present illness. All pertinent systems were reviewed above. Constitutional,  HEENT, cardiovascular, respiratory, GI, GU, musculoskeletal, neuro, psychiatric, endocrine,  integumentary and hematologic systems were reviewed and are otherwise  negative/unremarkable except for positive findings mentioned above in the HPI.   MEDICATIONS AT HOME:   Prior to Admission medications   Medication Sig Start Date End Date Taking? Authorizing Provider  acetaminophen (TYLENOL) 500 MG tablet Take 1 tablet (500 mg total) by mouth 3 (three) times daily. 02/17/16   Charles Bush, MD  amiodarone (PACERONE) 200 MG tablet Take 1 tablet (200 mg total) by mouth daily. 08/21/19   Eugenie Filler, MD  apixaban (ELIQUIS) 2.5 MG TABS tablet Take 1 tablet (2.5 mg total) by mouth 2 (two) times daily. 06/29/19   Charles Bush, MD  Ascorbic Acid (VITAMIN C PO) Take 1 tablet by mouth daily.    [provider]  atorvastatin (LIPITOR) 20 MG tablet Take 1 tablet (20 mg total) by mouth daily. 11/21/19   Charles Bush, MD  BD INSULIN SYRINGE U/F 30G X 1/2" 0.5 ML MISC AS DIRECTED. 03/06/19   Charles Bush, MD  budesonide-formoterol [facility]) 160-4.5 MCG/ACT inhaler Inhale 2 puffs into the lungs 2 (two) times daily. 08/20/19   Eugenie Filler, MD  citalopram  (CELEXA) 20 MG tablet Take 1 tablet (20 mg total) by mouth daily. 07/16/19   Charles Bush, MD  finasteride (PROSCAR) 5 MG tablet Take 1 tablet (5 mg total) by mouth daily. 04/03/19   Charles Bush, MD  glucose blood (ACCU-CHEK AVIVA PLUS) test strip Use as instructed to check blood sugar three times a day. Dx code E11.8 & E11.65 04/10/19   Charles Bush, MD  guaiFENesin [facility]) 600 MG 12  hr tablet Take 600 mg by mouth 2 (two) times daily as needed for cough or to loosen phlegm.    [provider]  insulin glargine (LANTUS) 100 UNIT/ML injection Lantus 22 units in the morning and 20 units at bedtime. 08/20/19   Eugenie Filler, MD  lisinopril (ZESTRIL) 10 MG tablet Take 1 tablet (10 mg total) by mouth daily. 10/02/19 12/24/19  Charles Bush, MD  Magnesium Oxide (MAG-OXIDE) 200 MG TABS Take 1 tablet (200 mg total) by mouth daily. 11/12/19   Clegg, Amy D, NP  metFORMIN (GLUCOPHAGE) 500 MG tablet Take 1 tablet (500 mg total) by mouth 2 (two) times daily with a meal. 12/24/19   Charles Bush, MD  metolazone (ZAROXOLYN) 5 MG tablet Take 1 tablet (5 mg total) by mouth once a week. 12/24/19   Charles Bush, MD  nitroGLYCERIN (NITROSTAT) 0.4 MG SL tablet Place 1 tablet (0.4 mg total) under the tongue every 5 (five) minutes as needed for chest [name]. 03/08/18   Almyra Deforest, PA  pantoprazole (PROTONIX) 40 MG tablet Take 1 tablet (40 mg total) by mouth daily. Take 1 tablet (20 mg total) by mouth daily. 08/20/19   Eugenie Filler, MD  potassium chloride SA (KLOR-CON) 20 MEQ tablet Take 1 tablet (20 mEq total) every Monday, Wednesday, Friday, Saturday, and Sunday. Take an additional 62meq with Metolazone. 10/30/19   Clegg, Amy D, NP  torsemide (DEMADEX) 20 MG tablet Take 3 tablets (60 mg total) by mouth 3 (three) times daily. TEMPORARY INCREASE IN DOSE 10/23/19   Tonia Ghent, MD  traMADol (ULTRAM) 50 MG tablet Take 1 tablet (50 mg total) by mouth every 6 (six) hours as needed for up to  7 days. 12/19/19 12/26/19  Arta Silence, MD  traZODone (DESYREL) 50 MG tablet Take 1 tablet (50 mg total) by mouth at bedtime. 08/20/19   Eugenie Filler, MD      VITAL SIGNS:  Blood pressure (!) 155/77, pulse 91, temperature 98.3 F (36.8 C), temperature source Oral, resp. rate 19, height 6\' 1"  (1.854 m), weight 103.6 kg, SpO2 98 %.  PHYSICAL EXAMINATION:  Physical Exam  GENERAL:  86 y.o.-year-old Caucasian male patient lying in the bed with no acute distress.  EYES: Pupils equal, round, reactive to light and accommodation. No scleral icterus. Extraocular muscles intact.  HEENT: Head atraumatic, normocephalic. Oropharynx and nasopharynx clear.  NECK:  Supple, no jugular venous distention. No thyroid enlargement, no tenderness.  LUNGS: Slight diminished bibasal breath sounds with occasional expiratory wheezes with diminished expiratory airflow and harsh vesicular breathing. CARDIOVASCULAR: Regular rate and rhythm, S1, S2 normal. No murmurs, rubs, or gallops.  ABDOMEN: Soft, nondistended, nontender. Bowel sounds present. No organomegaly or mass.  EXTREMITIES/musculoskeletal: Trace to 1+ bilateral lower extremity pitting edema with no clubbing or cyanosis.  He has right knee swelling with minimal tenderness and decreased range of motion secondary to [name]. NEUROLOGIC: Cranial nerves II through XII are intact. Muscle strength 5/5 in all extremities. Sensation intact. Gait not checked.  PSYCHIATRIC: The patient is alert and oriented x 3.  Normal affect and good eye contact. SKIN: No obvious rash, lesion, or ulcer.   LABORATORY PANEL:   CBC Recent Labs  Lab 12/25/19 1508  WBC 8.1  HGB 10.5*  HCT 32.8*  PLT 179   ------------------------------------------------------------------------------------------------------------------  Chemistries  Recent Labs  Lab 12/25/19 1508  NA 138  K 4.6  CL 96*  CO2 29  GLUCOSE 327*  BUN 68*  CREATININE 2.22*  CALCIUM 9.6  AST 21  ALT  13  ALKPHOS 75  BILITOT 1.0   ------------------------------------------------------------------------------------------------------------------  Cardiac Enzymes No results for input(s): TROPONINI in the last 168 hours. ------------------------------------------------------------------------------------------------------------------  RADIOLOGY:  CT Head Wo Contrast  Result Date: 12/25/2019 CLINICAL DATA:  86 year old male with head trauma. EXAM: CT HEAD WITHOUT CONTRAST CT CERVICAL SPINE WITHOUT CONTRAST TECHNIQUE: Multidetector CT imaging of the head and cervical spine was performed following the standard protocol without intravenous contrast. Multiplanar CT image reconstructions of the cervical spine were also generated. COMPARISON:  None. FINDINGS: CT HEAD FINDINGS Brain: There is moderate age-related atrophy and chronic microvascular ischemic changes. Patchy area of white matter hypodensity in the right posterior parietal convexity, likely chronic microvascular ischemic changes. 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 There is no acute intracranial hemorrhage. No mass effect or midline shift. No extra-axial fluid collection. Vascular: No hyperdense vessel or unexpected calcification. Skull: Normal. Negative for fracture or focal lesion. Sinuses/Orbits: No acute finding. Other: None CT CERVICAL SPINE FINDINGS Alignment: No acute subluxation. Skull base and vertebrae: No acute fracture. Osteopenia. Soft tissues and spinal canal: No prevertebral fluid or swelling. No visible canal hematoma. Disc levels: Multilevel degenerative changes with endplate irregularity and disc space narrowing and bone spurring and osteophyte. Upper chest: Negative. Other: Bilateral carotid bulb calcified plaques. Partially visualized pacemaker wire. IMPRESSION: 1. No acute intracranial hemorrhage. 2. Age-related atrophy and chronic microvascular ischemic changes. 3. No acute/traumatic cervical spine  pathology. Electronically Signed   By: Anner Crete M.D.   On: 12/25/2019 16:15   CT Cervical Spine Wo Contrast  Result Date: 12/25/2019 CLINICAL DATA:  86 year old male with head trauma. EXAM: CT HEAD WITHOUT CONTRAST CT CERVICAL SPINE WITHOUT CONTRAST TECHNIQUE: Multidetector CT imaging of the head and cervical spine was performed following the standard protocol without intravenous contrast. Multiplanar CT image reconstructions of the cervical spine were also generated. COMPARISON:  None. FINDINGS: CT HEAD FINDINGS Brain: There is moderate age-related atrophy and chronic microvascular ischemic changes. Patchy area of white matter hypodensity in the right posterior parietal convexity, likely chronic microvascular ischemic changes. 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 There is no acute intracranial hemorrhage. No mass effect or midline shift. No extra-axial fluid collection. Vascular: No hyperdense vessel or unexpected calcification. Skull: Normal. Negative for fracture or focal lesion. Sinuses/Orbits: No acute finding. Other: None CT CERVICAL SPINE FINDINGS Alignment: No acute subluxation. Skull base and vertebrae: No acute fracture. Osteopenia. Soft tissues and spinal canal: No prevertebral fluid or swelling. No visible canal hematoma. Disc levels: Multilevel degenerative changes with endplate irregularity and disc space narrowing and bone spurring and osteophyte. Upper chest: Negative. Other: Bilateral carotid bulb calcified plaques. Partially visualized pacemaker wire. IMPRESSION: 1. No acute intracranial hemorrhage. 2. Age-related atrophy and chronic microvascular ischemic changes. 3. No acute/traumatic cervical spine pathology. Electronically Signed   By: Anner Crete M.D.   On: 12/25/2019 16:15   DG Chest Portable 1 View  Result Date: 12/25/2019 CLINICAL DATA:  Fall. EXAM: PORTABLE CHEST 1 VIEW COMPARISON:  September 06, 2019. FINDINGS: Stable cardiomegaly. Status  post coronary bypass graft. Left-sided pacemaker is unchanged in position. No pneumothorax is noted. Elevated left hemidiaphragm is noted. Mild bibasilar subsegmental atelectasis is noted. Bony thorax is unremarkable. IMPRESSION: Mild bibasilar subsegmental atelectasis. Aortic Atherosclerosis (ICD10-I70.0). Electronically Signed   By: Marijo Conception M.D.   On: 12/25/2019 16:29   DG Knee Complete 4 Views Right  Result Date: 12/25/2019 CLINICAL DATA:  [name] following fall EXAM: RIGHT KNEE - COMPLETE 4+ VIEW COMPARISON:  December 19, 2019 FINDINGS: Frontal, lateral, and bilateral oblique views were obtained. Bones appear somewhat osteoporotic. There is no evident fracture or dislocation. The small joint effusion noted on recent prior study has nearly completely resolved. There is generalized joint space narrowing with spurring in all compartments, stable. No erosive change. There is extensive arterial vascular calcification in the popliteal, distal superficial femoral, and proximal trifurcation arterial vessels. IMPRESSION: Interval near complete resolution of small joint effusion. No fracture or dislocation. Generalized osteoarthritic change, stable. Bones osteoporotic. Extensive arterial vascular calcification noted. Electronically Signed   By: Lowella Grip III M.D.   On: 12/25/2019 16:29   CT Renal Stone Study  Result Date: 12/25/2019 CLINICAL DATA:  Status post fall. EXAM: CT ABDOMEN AND PELVIS WITHOUT CONTRAST TECHNIQUE: Multidetector CT imaging of the abdomen and pelvis was performed following the standard protocol without IV contrast. COMPARISON:  August 10, 2019 FINDINGS: Lower chest: Multiple sternal wires are seen. An artificial aortic valve is present. There is marked severity coronary artery calcification. Mild to moderate severity atelectasis and/or infiltrate is seen within the bilateral lung bases. Hepatobiliary: A punctate calcified granuloma is seen within the right lobe of the liver. No  gallstones, gallbladder wall thickening, or biliary dilatation. Pancreas: Unremarkable. No pancreatic ductal dilatation or surrounding inflammatory changes. Spleen: Normal in size without focal abnormality. Adrenals/Urinary Tract: A 1.4 cm low-attenuation right adrenal mass is seen. The left adrenal gland is normal in appearance. Kidneys are normal in size, without renal calculi or hydronephrosis. Multiple small bilateral renal cysts are seen. Bladder is unremarkable. Stomach/Bowel: Stomach is within normal limits. The appendix is not identified. No evidence of bowel wall thickening, distention, or inflammatory changes. Noninflamed diverticula are seen throughout the descending and sigmoid colon. Vascular/Lymphatic: Marked severity aortic calcification. No enlarged abdominal or pelvic lymph nodes. Reproductive: The prostate gland is mildly enlarged. A moderate to marked amount of prostate gland calcification is seen. There is a moderate size left-sided hydrocele which extends superiorly into the left inguinal region. Other: No abdominal wall hernia or abnormality. No abdominopelvic ascites. Musculoskeletal: A chronic appearing compression fracture deformity is seen at the level of the L1 vertebral body. Marked severity multilevel degenerative changes are noted throughout the lumbar spine. IMPRESSION: 1. Mild to moderate severity bibasilar atelectasis and/or infiltrate. 2. Diverticulosis without evidence of diverticulitis. 3. Moderate sized left-sided hydrocele which extends superiorly into the left inguinal region. 4. Marked severity coronary artery calcification. Aortic Atherosclerosis (ICD10-I70.0). Electronically Signed   By: Virgina Norfolk M.D.   On: 12/25/2019 16:27      IMPRESSION AND PLAN:   1.  Acute kidney injury superimposed stage IIIb chronic kidney disease. -The patient will be admitted to a medical monitored bed. -He will be hydrated with IV normal saline. -We will follow BMPs. -We will  hold off nephrotoxins.  2.  COPD exacerbation. -The patient will be placed on as needed and scheduled duo nebs. -We will place him on IV steroid therapy with Solu-Medrol. -We will hold off his Symbicort.  2.  Elevated troponin I. -We will follow serial troponin I's. -Cardiology consultation and 2D echo will be obtained in a.m. -I notified Dr. Johnsie Cancel about the patient. -This could be related to his acute kidney injury.  3.  Uncontrolled type 2 diabetes mellitus with hyperglycemia. -The patient will be placed on supplement coverage with frequent fingerstick blood glucose monitoring and coverage with NovoLog and will continue basal coverage. -Close monitoring  for required with steroid therapy.  4.  Atrial flutter. -We will continue amiodarone and Eliquis.  5.  Dyslipidemia. -Statin therapy will be resumed.  6.  BPH. -Proscar will be continued.  7.  Depression. -We will continue Celexa.  8.  DVT prophylaxis. -Eliquis will be continued.    All the records are reviewed and case discussed with ED provider. The plan of care was discussed in details with the patient (and family). I answered all questions. The patient agreed to proceed with the above mentioned plan. Further management will depend upon hospital course.   CODE STATUS: The patient is DNR/DNI.  TOTAL TIME TAKING CARE OF THIS PATIENT:55 minutes.    Christel [name] M.D on 12/26/2019 at 12:00 AM  [facility]   From 7 PM-7 AM, contact night-coverage [email or web address]  CC: Primary care physician; Charles Bush, MD   Note: This dictation was prepared with Dragon dictation along with smaller phrase technology. Any transcriptional errors that result from this process are unintentional.

## 2019-12-26 NOTE — Progress Notes (Addendum)
0840-Pts CBG resulted at 58, tech gave orange juice and helped feed pt breakfast, will recheck in 15 minutes and continue to monitor,  0900-Patients blood sugar up to 90, will continue to monitor, held patients scheduled lantus and MD notified

## 2019-12-27 DIAGNOSIS — J441 Chronic obstructive pulmonary disease with (acute) exacerbation: Secondary | ICD-10-CM

## 2019-12-27 DIAGNOSIS — I248 Other forms of acute ischemic heart disease: Secondary | ICD-10-CM

## 2019-12-27 DIAGNOSIS — N179 Acute kidney failure, unspecified: Secondary | ICD-10-CM

## 2019-12-27 LAB — BASIC METABOLIC PANEL
Anion gap: 7 (ref 5–15)
BUN: 45 mg/dL — ABNORMAL HIGH (ref 8–23)
CO2: 32 mmol/L (ref 22–32)
Calcium: 9.6 mg/dL (ref 8.9–10.3)
Chloride: 104 mmol/L (ref 98–111)
Creatinine, Ser: 1.54 mg/dL — ABNORMAL HIGH (ref 0.61–1.24)
GFR calc Af Amer: 47 mL/min — ABNORMAL LOW (ref >60)
GFR calc non Af Amer: 41 mL/min — ABNORMAL LOW (ref >60)
Glucose, Bld: 152 mg/dL — ABNORMAL HIGH (ref 70–99)
Potassium: 4.3 mmol/L (ref 3.5–5.1)
Sodium: 143 mmol/L (ref 135–145)

## 2019-12-27 LAB — CBC
HCT: 31.4 % — ABNORMAL LOW (ref 39.0–52.0)
Hemoglobin: 9.8 g/dL — ABNORMAL LOW (ref 13.0–17.0)
MCH: 29.5 pg (ref 26.0–34.0)
MCHC: 31.2 g/dL (ref 30.0–36.0)
MCV: 94.6 fL (ref 80.0–100.0)
Platelets: 194 10*3/uL (ref 150–400)
RBC: 3.32 MIL/uL — ABNORMAL LOW (ref 4.22–5.81)
RDW: 13.8 % (ref 11.5–15.5)
WBC: 7.5 10*3/uL (ref 4.0–10.5)
nRBC: 0 % (ref 0.0–0.2)

## 2019-12-27 LAB — GLUCOSE, CAPILLARY
Glucose-Capillary: 133 mg/dL — ABNORMAL HIGH (ref 70–99)
Glucose-Capillary: 145 mg/dL — ABNORMAL HIGH (ref 70–99)
Glucose-Capillary: 183 mg/dL — ABNORMAL HIGH (ref 70–99)
Glucose-Capillary: 277 mg/dL — ABNORMAL HIGH (ref 70–99)
Glucose-Capillary: 285 mg/dL — ABNORMAL HIGH (ref 70–99)
Glucose-Capillary: 300 mg/dL — ABNORMAL HIGH (ref 70–99)
Glucose-Capillary: 343 mg/dL — ABNORMAL HIGH (ref 70–99)

## 2019-12-27 MED ORDER — METHYLPREDNISOLONE SODIUM SUCC 40 MG IJ SOLR
40.0000 mg | Freq: Four times a day (QID) | INTRAMUSCULAR | Status: DC
  Administered 2019-12-27 – 2019-12-28 (×4): 40 mg via INTRAVENOUS
  Filled 2019-12-27 (×4): qty 1

## 2019-12-27 MED ORDER — DOXYCYCLINE HYCLATE 100 MG PO TABS
100.0000 mg | ORAL_TABLET | Freq: Two times a day (BID) | ORAL | Status: AC
  Administered 2019-12-27 – 2020-01-01 (×10): 100 mg via ORAL
  Filled 2019-12-27 (×10): qty 1

## 2019-12-27 MED ORDER — IPRATROPIUM-ALBUTEROL 0.5-2.5 (3) MG/3ML IN SOLN
3.0000 mL | RESPIRATORY_TRACT | Status: DC
  Administered 2019-12-27 – 2019-12-28 (×6): 3 mL via RESPIRATORY_TRACT
  Filled 2019-12-27 (×8): qty 3

## 2019-12-27 NOTE — TOC Initial Note (Signed)
Transition of Care [facility]) - Initial/Assessment Note    Patient Details  Name: Charles [name] MRN: [id number] Date of Birth: September 26, 1932  [facility]) CM/SW Contact:    Shelbie Ammons, RN Phone Number: 12/27/2019, 12:57 PM  Clinical Narrative:   RNCM assessed patient at bedside. Patient awake and alert and reports that he is feeling some better today. Patient lives alone in single level home and reports that his DIL provides him with all his support. She takes him to appointments, gets his groceries and meds and pays his bills. Patient reports that at this time he doesn't think that he will need any other help but after he is seen by PT/OT he will be open to either home health if he needs it or even short term rehab but hopes that will not be the case. RNCM will continue to follow for needs.           Expected Discharge Plan: [place] Barriers to Discharge: Continued Medical Work up   Patient Goals and CMS Choice        Expected Discharge Plan and Services Expected Discharge Plan: [place]   Discharge Planning Services: CM Consult   Living arrangements for the past 2 months: Single Family Home                                      Prior Living Arrangements/Services Living arrangements for the past 2 months: Single Family Home Lives with:: Self Patient language and need for interpreter reviewed:: Yes Do you feel safe going back to the place where you live?: Yes      Need for Family Participation in Patient Care: Yes (Comment) Care giver support system in place?: Yes (comment)   Criminal Activity/Legal Involvement Pertinent to Current Situation/Hospitalization: No - Comment as needed  Activities of Daily Living Home Assistive Devices/Equipment: Walker (specify type), Oxygen ADL Screening (condition at time of admission) Patient's cognitive ability adequate to safely complete daily activities?: No Is the patient deaf or  have difficulty hearing?: No Does the patient have difficulty seeing, even when wearing glasses/contacts?: No Does the patient have difficulty concentrating, remembering, or making decisions?: Yes Patient able to express need for assistance with ADLs?: Yes Does the patient have difficulty dressing or bathing?: Yes Independently performs ADLs?: No Does the patient have difficulty walking or climbing stairs?: Yes Weakness of Legs: Both Weakness of Arms/Hands: None  Permission Sought/Granted                  Emotional Assessment Appearance:: Appears stated age Attitude/Demeanor/Rapport: Engaged Affect (typically observed): Appropriate Orientation: : Oriented to Self, Oriented to Place, Oriented to  Time, Oriented to Situation Alcohol / Substance Use: Not Applicable Psych Involvement: No (comment)  Admission diagnosis:  Weakness [R53.1] COPD exacerbation (HCC) [J44.1] Troponin level elevated [R77.8] AKI (acute kidney injury) ([place]) [N17.9] Patient Active Problem List   Diagnosis Date Noted  . Pressure injury of skin 12/26/2019  . Atrial fibrillation ([place])   . Right leg pain 12/25/2019  . COPD exacerbation ([place]) 12/25/2019  . Centrilobular emphysema ([place]) 10/29/2019  . Acute on chronic diastolic (congestive) heart failure ([place]) 10/08/2019  . Hypotension 10/04/2019  . Protein-calorie malnutrition, moderate ([place]) 10/04/2019  . DNR (do not resuscitate) present on admission 09/08/2019  . Chronic respiratory failure ([place]) 07/19/2019  . CKD stage 3 due to type 2 diabetes  mellitus ([place]) 12/27/2018  . S/P TAVR (transcatheter aortic valve replacement)   . S/P placement of cardiac pacemaker  04/22/2018  . Troponin level elevated 03/25/2018  . Paroxysmal atrial flutter ([place])   . Scrotal swelling 02/16/2018  . Coronary artery disease involving native coronary artery of native heart without angina pectoris   . Chronic heart failure with preserved ejection fraction (HFpEF) ([place])  09/06/2017  . BPH (benign prostatic hyperplasia) 07/01/2017  . Chronic anticoagulation 11/05/2016  . Chronic radicular pain of lower back 02/17/2016  . Aortic stenosis, severe 06/04/2014  . Weakness 04/02/2014  . DDD (degenerative disc disease), lumbar 10/02/2013  . Ex-smoker   . Carotid arterial disease ([place]) 08/23/2013  . MDD (major depressive disorder), recurrent episode, moderate ([place]) 09/21/2007  . Osteoarthritis 09/21/2007  . COLONIC POLYPS 01/31/2007  . Diabetes mellitus type 2, uncontrolled, with complications ([place]) [id number]  . HYPERCHOLESTEROLEMIA 01/31/2007  . ERECTILE DYSFUNCTION 01/31/2007  . Essential hypertension 01/31/2007  . GERD 01/31/2007  . PSORIASIS 01/31/2007   PCP:  [name], MD Pharmacy:   [place], [place] - [address] [address] Phone: [phone] Fax: [phone]     Social Determinants of Health (SDOH) Interventions    Readmission Risk Interventions Readmission Risk Prevention Plan 08/14/2019  Transportation Screening Complete  PCP or Specialist Appt within 3-5 Days Complete  HRI or [place] Complete  Social Work Consult for [place] Planning/Counseling Complete  Palliative Care Screening Complete  Medication Review Press photographer) Complete  Some recent data might be hidden

## 2019-12-27 NOTE — Hospital Course (Signed)
86 year old male with history of heart failure preserved ejection fraction, last EF 60 to 65%, CAD status post CABG x4 in 2004, aortic stenosis status post TAVR 2020, paroxysmal A. fib/flutter on Eliquis, high degree AV block status post pacemaker, hypertension CKD stage III being seen due to elevated troponins.  followed by our heart failure and structural team at [facility].

## 2019-12-27 NOTE — Evaluation (Addendum)
Clinical/Bedside Swallow Evaluation Patient Details  Name: Charles [name] MRN: [id number] Date of Birth: [birth year]  Today's Date: 12/27/2019 Time: SLP Start Time (ACUTE ONLY): 0820 SLP Stop Time (ACUTE ONLY): 0900 SLP Time Calculation (min) (ACUTE ONLY): 40 min  Past Medical History:  Past Medical History:  Diagnosis Date  . (HFpEF) heart failure with preserved ejection fraction ([place])    a. 11/2019 Echo: EF 60-65%. Mild fxnl MS, mild MR. Mild AS. Ao root 58mm.  . Abdominal aortic atherosclerosis ([place])    by xray  . Arthritis    in lower back (06/24/2015)  . 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 CAD (coronary artery disease) 2004   a. s/p CABG in 2004 with LIMA-LAD, SVG-RCA, SVG-OM, and SVG-RI; b. 10/2018 Cath: 4/4 patent grafts.  . Carotid stenosis 1999   s/p L CEA  . Colon polyp 2005   (Dr. Tiffany Kocher)  . Compression fracture of L1 lumbar vertebra (HCC) remote  . COPD (chronic obstructive pulmonary disease) ([place]) 03/2011   by xray  . COVID-19 09/06/2019  . Depression    hx  . GERD (gastroesophageal reflux disease) 2003   h/o duodenal ulcer per EGD as well as esophagitis  . High-grade Heart block    a. 04/2018 s/p SJM PPM.  . History of colon polyps 2003, 2005   adenomatous Vira Agar)  . History of tobacco abuse   . HLD (hyperlipidemia)   . HTN (hypertension)   . Paroxysmal atrial fibrillation & flutter    a. s/p remote DCCV; b. CHA2DS2VASc = 6-->amio/eliquis.  . Pneumonia due to COVID-19 virus 09/07/2019   Hospitalization 09/2019  . Presence of permanent cardiac pacemaker   . Psoriasis   . Renal artery stenosis ([place]) 2004   70% bilateral, followed by cards  . 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 Severe aortic stenosis    a. 12/2018 s/p TAVR; b. 11/2019 Echo: Mean/peak gradients 11/23 mmHg. AVA 1.54cm^2.  [name] T2DM (type 2 diabetes  mellitus) ([place]) 1995  . Urge incontinence of urine    Past Surgical History:  Past Surgical History:  Procedure Laterality Date  . CARDIAC CATHETERIZATION  03/06/2003   No intervention - recommend CABG  . CARDIOVASCULAR STRESS TEST  11/2010   normal perfusion, no evidence of ischemia, EF 62% post exercise  . CARDIOVASCULAR STRESS TEST  11/28/2012   Mild diaphragmatic attenuation; cannot exclude a focal region of nontransmural inferior scar  . CARDIOVERSION N/A 06/25/2015   Procedure: CARDIOVERSION;  Surgeon: Pixie Casino, MD;  Location: [facility] ENDOSCOPY;  Service: Cardiovascular;  Laterality: N/A;  . CARDIOVERSION N/A 11/02/2016   Procedure: CARDIOVERSION;  Surgeon: [name], MD;  Location: [place];  Service: Cardiovascular;  Laterality: N/A;  . CARDIOVERSION N/A 03/20/2019   Procedure: CARDIOVERSION;  Surgeon: Jerline Pain, MD;  Location: [place];  Service: Cardiovascular;  Laterality: N/A;  . CAROTID ENDARTERECTOMY Left 1999   ([place])  . CATARACT EXTRACTION W/ INTRAOCULAR LENS  IMPLANT, BILATERAL Bilateral 01/2013   [place]  . COLONOSCOPY  2003   colon polyp x3 - adenomatous Tiffany Kocher)  . COLONOSCOPY  10/08/2012   2 TA, diverticulosis, int hem, no rpt rec Tiffany Kocher)  . CORONARY ANGIOPLASTY    . CORONARY ARTERY BYPASS GRAFT  03/07/2003   4v CABG ([place]) with LIMA to LAD, vein graft to RCA, 1st obtuse marginal, and ramus intermedius  .  ESOPHAGOGASTRODUODENOSCOPY  10/08/2012   nl esophagus, duodenitis and erosive gastropathy, path - gastropathy no Hpylori, no rpt rec  . LUMBAR EPIDURAL INJECTION  03/2017   L5/S1 ([place])  . PACEMAKER IMPLANT N/A 04/21/2018   Procedure: PACEMAKER IMPLANT -- Dual Chamber;  Surgeon: Deboraha Sprang, MD;  Location: [place] CV LAB;  Service: Cardiovascular;  Laterality: N/A;  . PERIPHERAL VASCULAR CATHETERIZATION N/A 02/02/2016   Procedure: Renal Angiography;  Surgeon: [name], MD;  Location: [place] CV LAB;  Service: Cardiovascular;   Laterality: N/A;  . PERIPHERAL VASCULAR CATHETERIZATION N/A 02/02/2016   Procedure: Abdominal Aortogram;  Surgeon: [name], MD;  Location: [place] CV LAB;  Service: Cardiovascular;  Laterality: N/A;  . RENAL DOPPLER  11/29/2011   Celiac&SMA-demonstrated vessel narrowing suggestive of a greater than 50% diameter reduction. Bilateral renal arteries-demonstrated vessel narrowing of 60-99% diameter reduction. Rt Kidney-mid pole lateral simple cyst noted measuring 1.29x0.76x1.11cm and exophytic cyst outside lower pole measuring 1.23x0.96x1.31. Lft Kidney-lateral mid to lower pole simple cyst measuering-1.24x9.83x1.24  . RIGHT/LEFT HEART CATH AND CORONARY/GRAFT ANGIOGRAPHY N/A 10/20/2017   Procedure: RIGHT/LEFT HEART CATH AND CORONARY/GRAFT ANGIOGRAPHY;  Surgeon: Burnell Blanks, MD;  Location: [place] CV LAB;  Service: Cardiovascular;  Laterality: N/A;  . TEE WITHOUT CARDIOVERSION N/A 06/25/2015   Procedure: TRANSESOPHAGEAL ECHOCARDIOGRAM (TEE);  Surgeon: Pixie Casino, MD;  Location[facility] ENDOSCOPY;  Service: Cardiovascular;  Laterality: N/A;  . TEE WITHOUT CARDIOVERSION N/A 11/02/2016   Procedure: TRANSESOPHAGEAL ECHOCARDIOGRAM (TEE);  Surgeon: [name], MD;  Location: [place];  Service: Cardiovascular;  Laterality: N/A;  . TEE WITHOUT CARDIOVERSION N/A 12/05/2018   Procedure: TRANSESOPHAGEAL ECHOCARDIOGRAM (TEE);  Surgeon: Sherren Mocha, MD;  Location: [place];  Service: Open Heart Surgery;  Laterality: N/A;  . TONSILLECTOMY    . TRANSCATHETER AORTIC VALVE REPLACEMENT, TRANSFEMORAL N/A 12/05/2018   Procedure: TRANSCATHETER AORTIC VALVE REPLACEMENT, TRANSFEMORAL;  Surgeon: Sherren Mocha, MD;  Location: [place];  Service: Open Heart Surgery;  Laterality: N/A;  . UPPER GASTROINTESTINAL ENDOSCOPY  2003   reflux esophagitis, erosive gastropathy, duodenal ulcer   HPI:  Pt is a 86 y.o. male with coronary disease status post CABG, HOH, CHF, COVID-19 back in December 2020(stated  he feels he has not fully recovered from it), CKD, status post TAVR who comes in for increasing weakness.  Patient comes in for inability to care for himself independently.  Pt was recently seen in the ED on 12/19/2019 s/p Fall.  Since then it sounds like patient's been unable to care for himself independently.  He spent the last 5 days in the recliner and not cleaning himself up.  Family is concerned about a foul smell to his urine and increasing weakness.  Patient does wear 3 L of oxygen at baseline.  Head CT: "Moderate Age-related atrophy and chronic microvascular ischemic changes". CXR: "Mild bibasilar subsegmental atelectasis".  Assessment / Plan / Recommendation Clinical Impression  Pt appears to present w/ adequate oropharyngeal phase swallowing w/ oral intake w/ reduced risk for aspiration when following general aspiration precautions including Slowing Down and using Smaller bites/sips when eating and drinking. Pt does have baseline Pulmonary and Cardiac decline which can lead to increased WOB/SOB w/ exertion. Mild increased SOB was noted during physical exertion including oral intake, talking, moving up in bed. Pt was instructed on Less talking during oral intake and the need to take Rest Breaks to calm breathing when feeling any increased WOB/SOB. Pt consumed trials of thin liquids, purees, and softened solids w/ no  immediate, overt clinical s/s of aspiration noted; no wet vocal quality or coughing. Continued SOB noted w/ the exertion of the task - Rest Breaks encouraged. Oral phase grossly WFL, however, noted lengthier oral phase time for mastication and oral clearing when pt took Large bites of solid foods; this also increased WOB d/t the exertion. Again, continued education given on taking Smaller bites and clearing mouth b/t bites/sips -- eating/drinking Slowly. Pt required verbal/tactile cues as reminders. (Noted Head CT results w/ Moderate atrophy/ischemic changes - unsure of baseline Cognitive  status). OM exam grossly [facility] w/ no unilateral weakness noted. Dentures U/L.  Recommend a mech soft diet for conservation of energy w/ thin liquids; NO straws. Recommend aspiration precautions; Pills Whole in puree for safer swallowing. Rest Breaks during meals to lessen WOB/SOB. ST services can be available for further education/needs while admitted. NSG/MD updated.  SLP Visit Diagnosis: Dysphagia, unspecified (R13.10)    Aspiration Risk  Mild aspiration risk(reduced following general precautions)    Diet Recommendation  Mech Soft consistency diet (for conservation of energy w/ mastication); Thin liquids VIA CUP - NO Straws. General aspiration precautions; Reflux precautions. Rest Breaks during meals to lessen SOB/WOB from exertion of task. Lessen distractions during meals.   Medication Administration: Whole meds with puree(for safer swallowing)    Other  Recommendations Recommended Consults: (Dietician f/u) Oral Care Recommendations: Oral care BID;Oral care before and after PO;Patient independent with oral care;Staff/trained caregiver to provide oral care Other Recommendations: (n/a)   Follow up Recommendations None      Frequency and Duration (n/a)  (na/)       Prognosis Prognosis for Safe Diet Advancement: Fair(-Good) Barriers to Reach Goals: Cognitive deficits;Time post onset;Severity of deficits;Behavior      Swallow Study   General Date of Onset: 12/25/19 HPI: Pt is a 86 y.o. male with coronary disease status post CABG, HOH, CHF, COVID-19 back in December 2020(stated he feels he has not fully recovered from it), CKD, status post TAVR who comes in for increasing weakness.  Patient comes in for inability to care for himself independently.  Pt was recently seen in the ED on 12/19/2019 s/p Fall.  Since then it sounds like patient's been unable to care for himself independently.  He spent the last 5 days in the recliner and not cleaning himself up.  Family is concerned about a foul smell  to his urine and increasing weakness.  Patient does wear 3 L of oxygen at baseline.  Head CT: "Moderate Age-related atrophy and chronic microvascular ischemic changes". Type of Study: Bedside Swallow Evaluation Previous Swallow Assessment: none reported Diet Prior to this Study: Regular;Thin liquids Temperature Spikes Noted: No(wbc 7.5) Respiratory Status: Nasal cannula(3L) History of Recent Intubation: No Behavior/Cognition: Alert;Cooperative;Pleasant mood;Distractible;Requires cueing(HOH) Oral Cavity Assessment: Dry Oral Care Completed by SLP: Yes Oral Cavity - Dentition: Dentures, top;Dentures, bottom Vision: Functional for self-feeding Self-Feeding Abilities: Able to feed self;Needs assist;Needs set up Patient Positioning: Upright in bed(needed positioning support) Baseline Vocal Quality: Normal(easily SOB) Volitional Cough: Strong Volitional Swallow: Able to elicit    Oral/Motor/Sensory Function Overall Oral Motor/Sensory Function: Within functional limits   Ice Chips Ice chips: Within functional limits Presentation: Spoon(fed; 4 trials)   Thin Liquid Thin Liquid: Within functional limits Presentation: Cup;Self Fed(~12 trials some w/ multiple sips and gulpy) Other Comments: education given on Slowing Down when drinking    Nectar Thick Nectar Thick Liquid: Not tested   Honey Thick Honey Thick Liquid: Not tested   Puree Puree: Within functional limits Presentation:  Self Fed;Spoon(~4 ozs)   Solid     Solid: Impaired Presentation: Self Fed;Spoon(10+ bites sometimes too much at one time) Pharyngeal Phase Impairments: (none) Other Comments: education given on Smaller Bites at a time and clearing mouth b/t trials       Orinda Kenner, MS, CCC-SLP [facility] Nien[facility]is 12/27/2019,10:07 AM

## 2019-12-27 NOTE — Progress Notes (Signed)
PROGRESS NOTE    Charles [name]  [id number] DOB: Nov 14, 1931 DOA: 12/25/2019 PCP: [name], MD       Assessment & Plan:   Active Problems:   Weakness   Troponin level elevated   COPD exacerbation (HCC)   Pressure injury of skin   Atrial fibrillation (HCC)   AKI on CKD stage IIIb: Cr continues to trend down. Avoid nephrotoxic meds   COPD exacerbation: increased IV steroids frequency. Scheduled duonebs. Continue on azithromycin for the anti-inflammatory properties. Encourage incentive spirometry   Elevated troponins: trending up, likely secondary to demand ischemia vs CKD & diastolic CHF as per cardio  DM2: uncontrolled. Continue on SSI w/ accuchecks  Atrial flutter: continue amiodarone and eliquis. Continue on tele  Dyslipidemia: continue on statin  BPH: continue on proscar   Depression: continue celexa.  Generalized weakness: PT/OT consulted    DVT prophylaxis: eliquis Code Status: DNR  Family Communication:  Discussed pt's care w/ pt's daughter in law, [name] and answered her questions. Pt's PCP recently ordered the pt to have hospital bed at home as well as palliative care  Disposition Plan: will likely d/c home w/ palliative care vs SNF, as pt's daughter in law, [name], wanted to discuss w/ her husband which is pt's son   Consultants:   cardio  Procedures:   Antimicrobials: azithromycin    Subjective: Pt c/o fatigue   Objective: Vitals:   12/26/19 1155 12/26/19 1658 12/26/19 1946 12/27/19 0455  BP: (!) 155/86 (!) 161/92 (!) 142/81 (!) 158/92  Pulse: 86 92 100 92  Resp: 16 18    Temp: 98.4 F (36.9 C) 98.4 F (36.9 C) 98.3 F (36.8 C) 97.8 F (36.6 C)  TempSrc: Oral Axillary Oral Oral  SpO2: 98% 97% 97% 98%  Weight:    104.6 kg  Height:        Intake/Output Summary (Last 24 hours) at 12/27/2019 [id number] Last data filed at 12/27/2019 0524 Gross per 24 hour  Intake 2274.06 ml  Output 1950 ml  Net 324.06 ml   Filed Weights   12/25/19 2232 12/26/19 0615 12/27/19 0455  Weight: 103.6 kg 104.1 kg 104.6 kg    Examination:  General exam: Appears lethargic Respiratory system: decreased breath sounds b/l. Cardiovascular system: S1 & S2+. No rubs, gallops or clicks.  Gastrointestinal system: Abdomen is nondistended, soft and nontender. Normal bowel sounds heard. Central nervous system: Alert and awake. Moves all 4 extremities Psychiatry: flat mood and affect     Data Reviewed: I have personally reviewed following labs and imaging studies  CBC: Recent Labs  Lab 12/25/19 1508 12/26/19 0039 12/27/19 0601  WBC 8.1 7.4 7.5  NEUTROABS 6.3  --   --   HGB 10.5* 9.8* 9.8*  HCT 32.8* 31.1* 31.4*  MCV 92.4 93.7 94.6  PLT 179 182 [id number]   Basic Metabolic Panel: Recent Labs  Lab 12/25/19 1508 12/26/19 0039 12/27/19 0601  NA 138 142 143  K 4.6 4.0 4.3  CL 96* 101 104  CO2 29 31 32  GLUCOSE 327* 224* 152*  BUN 68* 62* 45*  CREATININE 2.22* 1.94* 1.54*  CALCIUM 9.6 9.1 9.6   GFR: Estimated Creatinine Clearance: 44.5 mL/min (A) (by C-G formula based on SCr of 1.54 mg/dL (H)). Liver Function Tests: Recent Labs  Lab 12/25/19 1508  AST 21  ALT 13  ALKPHOS 75  BILITOT 1.0  PROT 7.9  ALBUMIN 4.0   No results for input(s): LIPASE, AMYLASE in the last 168 hours. No results for  input(s): AMMONIA in the last 168 hours. Coagulation Profile: No results for input(s): INR, PROTIME in the last 168 hours. Cardiac Enzymes: Recent Labs  Lab 12/25/19 1508  CKTOTAL 216   BNP (last 3 results) Recent Labs    01/26/19 0000 10/08/19 1151  PROBNP 1,162* 2,349*   HbA1C: Recent Labs    12/26/19 0039  HGBA1C 8.4*   CBG: Recent Labs  Lab 12/26/19 1156 12/26/19 1629 12/26/19 2017 12/27/19 0016 12/27/19 0452  GLUCAP 192* 189* 224* 183* 145*   Lipid Profile: No results for input(s): CHOL, HDL, LDLCALC, TRIG, CHOLHDL, LDLDIRECT in the last 72 hours. Thyroid Function Tests: Recent Labs    12/25/19 1508    TSH 3.672  FREET4 1.61*   Anemia Panel: No results for input(s): VITAMINB12, FOLATE, FERRITIN, TIBC, IRON, RETICCTPCT in the last 72 hours. Sepsis Labs: No results for input(s): PROCALCITON, LATICACIDVEN in the last 168 hours.  No results found for this or any previous visit (from the past 240 hour(s)).       Radiology Studies: CT Head Wo Contrast  Result Date: 12/25/2019 CLINICAL DATA:  86 year old male with head trauma. EXAM: CT HEAD WITHOUT CONTRAST CT CERVICAL SPINE WITHOUT CONTRAST TECHNIQUE: Multidetector CT imaging of the head and cervical spine was performed following the standard protocol without intravenous contrast. Multiplanar CT image reconstructions of the cervical spine were also generated. COMPARISON:  None. FINDINGS: CT HEAD FINDINGS Brain: There is moderate age-related atrophy and chronic microvascular ischemic changes. Patchy area of white matter hypodensity in the right posterior parietal convexity, likely chronic microvascular ischemic changes. 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 There is no acute intracranial hemorrhage. No mass effect or midline shift. No extra-axial fluid collection. Vascular: No hyperdense vessel or unexpected calcification. Skull: Normal. Negative for fracture or focal lesion. Sinuses/Orbits: No acute finding. Other: None CT CERVICAL SPINE FINDINGS Alignment: No acute subluxation. Skull base and vertebrae: No acute fracture. Osteopenia. Soft tissues and spinal canal: No prevertebral fluid or swelling. No visible canal hematoma. Disc levels: Multilevel degenerative changes with endplate irregularity and disc space narrowing and bone spurring and osteophyte. Upper chest: Negative. Other: Bilateral carotid bulb calcified plaques. Partially visualized pacemaker wire. IMPRESSION: 1. No acute intracranial hemorrhage. 2. Age-related atrophy and chronic microvascular ischemic changes. 3. No acute/traumatic cervical spine pathology.  Electronically Signed   By: Anner Crete M.D.   On: 12/25/2019 16:15   CT Cervical Spine Wo Contrast  Result Date: 12/25/2019 CLINICAL DATA:  86 year old male with head trauma. EXAM: CT HEAD WITHOUT CONTRAST CT CERVICAL SPINE WITHOUT CONTRAST TECHNIQUE: Multidetector CT imaging of the head and cervical spine was performed following the standard protocol without intravenous contrast. Multiplanar CT image reconstructions of the cervical spine were also generated. COMPARISON:  None. FINDINGS: CT HEAD FINDINGS Brain: There is moderate age-related atrophy and chronic microvascular ischemic changes. Patchy area of white matter hypodensity in the right posterior parietal convexity, likely chronic microvascular ischemic changes. 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 There is no acute intracranial hemorrhage. No mass effect or midline shift. No extra-axial fluid collection. Vascular: No hyperdense vessel or unexpected calcification. Skull: Normal. Negative for fracture or focal lesion. Sinuses/Orbits: No acute finding. Other: None CT CERVICAL SPINE FINDINGS Alignment: No acute subluxation. Skull base and vertebrae: No acute fracture. Osteopenia. Soft tissues and spinal canal: No prevertebral fluid or swelling. No visible canal hematoma. Disc levels: Multilevel degenerative changes with endplate irregularity and disc space narrowing and  bone spurring and osteophyte. Upper chest: Negative. Other: Bilateral carotid bulb calcified plaques. Partially visualized pacemaker wire. IMPRESSION: 1. No acute intracranial hemorrhage. 2. Age-related atrophy and chronic microvascular ischemic changes. 3. No acute/traumatic cervical spine pathology. Electronically Signed   By: Anner Crete M.D.   On: 12/25/2019 16:15   DG Chest Portable 1 View  Result Date: 12/25/2019 CLINICAL DATA:  Fall. EXAM: PORTABLE CHEST 1 VIEW COMPARISON:  September 06, 2019. FINDINGS: Stable cardiomegaly. Status post coronary  bypass graft. Left-sided pacemaker is unchanged in position. No pneumothorax is noted. Elevated left hemidiaphragm is noted. Mild bibasilar subsegmental atelectasis is noted. Bony thorax is unremarkable. IMPRESSION: Mild bibasilar subsegmental atelectasis. Aortic Atherosclerosis (ICD10-I70.0). Electronically Signed   By: Marijo Conception M.D.   On: 12/25/2019 16:29   DG Knee Complete 4 Views Right  Result Date: 12/25/2019 CLINICAL DATA:  Pain following fall EXAM: RIGHT KNEE - COMPLETE 4+ VIEW COMPARISON:  December 19, 2019 FINDINGS: Frontal, lateral, and bilateral oblique views were obtained. Bones appear somewhat osteoporotic. There is no evident fracture or dislocation. The small joint effusion noted on recent prior study has nearly completely resolved. There is generalized joint space narrowing with spurring in all compartments, stable. No erosive change. There is extensive arterial vascular calcification in the popliteal, distal superficial femoral, and proximal trifurcation arterial vessels. IMPRESSION: Interval near complete resolution of small joint effusion. No fracture or dislocation. Generalized osteoarthritic change, stable. Bones osteoporotic. Extensive arterial vascular calcification noted. Electronically Signed   By: Lowella Grip III M.D.   On: 12/25/2019 16:29   CT Renal Stone Study  Result Date: 12/25/2019 CLINICAL DATA:  Status post fall. EXAM: CT ABDOMEN AND PELVIS WITHOUT CONTRAST TECHNIQUE: Multidetector CT imaging of the abdomen and pelvis was performed following the standard protocol without IV contrast. COMPARISON:  August 10, 2019 FINDINGS: Lower chest: Multiple sternal wires are seen. An artificial aortic valve is present. There is marked severity coronary artery calcification. Mild to moderate severity atelectasis and/or infiltrate is seen within the bilateral lung bases. Hepatobiliary: A punctate calcified granuloma is seen within the right lobe of the liver. No gallstones,  gallbladder wall thickening, or biliary dilatation. Pancreas: Unremarkable. No pancreatic ductal dilatation or surrounding inflammatory changes. Spleen: Normal in size without focal abnormality. Adrenals/Urinary Tract: A 1.4 cm low-attenuation right adrenal mass is seen. The left adrenal gland is normal in appearance. Kidneys are normal in size, without renal calculi or hydronephrosis. Multiple small bilateral renal cysts are seen. Bladder is unremarkable. Stomach/Bowel: Stomach is within normal limits. The appendix is not identified. No evidence of bowel wall thickening, distention, or inflammatory changes. Noninflamed diverticula are seen throughout the descending and sigmoid colon. Vascular/Lymphatic: Marked severity aortic calcification. No enlarged abdominal or pelvic lymph nodes. Reproductive: The prostate gland is mildly enlarged. A moderate to marked amount of prostate gland calcification is seen. There is a moderate size left-sided hydrocele which extends superiorly into the left inguinal region. Other: No abdominal wall hernia or abnormality. No abdominopelvic ascites. Musculoskeletal: A chronic appearing compression fracture deformity is seen at the level of the L1 vertebral body. Marked severity multilevel degenerative changes are noted throughout the lumbar spine. IMPRESSION: 1. Mild to moderate severity bibasilar atelectasis and/or infiltrate. 2. Diverticulosis without evidence of diverticulitis. 3. Moderate sized left-sided hydrocele which extends superiorly into the left inguinal region. 4. Marked severity coronary artery calcification. Aortic Atherosclerosis (ICD10-I70.0). Electronically Signed   By: Virgina Norfolk M.D.   On: 12/25/2019 16:27  Scheduled Meds: . amiodarone  200 mg Oral Daily  . apixaban  2.5 mg Oral BID  . vitamin C  500 mg Oral Daily  . atorvastatin  20 mg Oral Daily  . citalopram  10 mg Oral Daily  . finasteride  5 mg Oral Daily  . insulin aspart  0-20 Units  Subcutaneous Q4H  . insulin glargine  15 Units Subcutaneous q morning - 10a   And  . insulin glargine  15 Units Subcutaneous QHS  . magnesium oxide  200 mg Oral Daily  . methylPREDNISolone (SOLU-MEDROL) injection  40 mg Intravenous Q8H  . pantoprazole  40 mg Oral Daily  . traZODone  50 mg Oral QHS   Continuous Infusions: . sodium chloride 10 mL/hr at 12/26/19 1826  . azithromycin Stopped (12/26/19 1742)     LOS: 2 days    Time spent: 30 mins    Wyvonnia Dusky, MD [facility] Pager [phone]  If 7PM-7AM, please contact night-coverage [email or web address] 12/27/2019, 8:12 AM

## 2019-12-27 NOTE — Evaluation (Signed)
Occupational Therapy Evaluation Patient Details Name: Charles [name] MRN: [id number] DOB: 1932-06-28 Today's Date: 12/27/2019    History of Present Illness Pt is an 86 y.o. male presenting to hospital 12/25/19 with increasing weakness; per chart pt has been declining since Covid in December and continued to decline after recent fall.  Pt admitted with AKI, COPD exacerbation, and elevated troponin.  Per chart pt seen in ED 3/17 after a fall and sustained R knee contusion.  PMH includes CAD s/p CABG, CHF, CKD, s/p TAVR, L1 compression fx, COVID-19 (+) 09/06/19, pacemaker, a-flutter.   Clinical Impression   Pt was seen for OT evaluation this date. Prior to hospital admission, pt reports being Indep with BADLs with help from DIL for IADLs. Pt reports living in [facility] with level entry. Currently pt demonstrates impairments as described below (See OT problem list) which functionally limit *his/her ability to perform ADL/self-care tasks. Pt currently requires MOD/MAX A with arm in arm SPS transfer from EOB (elevated) to recliner and MAX A with LB ADLs. Pt would benefit from skilled OT to address noted impairments and functional limitations (see below for any additional details) in order to maximize safety and independence while minimizing falls risk and caregiver burden. Upon hospital discharge, recommend STR to maximize pt safety and return to PLOF.      Follow Up Recommendations  SNF    Equipment Recommendations  3 in 1 bedside commode;Tub/shower seat    Recommendations for Other Services       Precautions / Restrictions Precautions Precautions: Fall Restrictions Weight Bearing Restrictions: No      Mobility Bed Mobility Overal bed mobility: Needs Assistance Bed Mobility: Supine to Sit     Supine to sit: Mod assist;HOB elevated        Transfers Overall transfer level: Needs assistance Equipment used: Rolling walker (2 wheeled) Transfers: Sit to/from [facility] Sit  to Stand: Mod assist;Max assist Stand pivot transfers: Mod assist;Max assist       General transfer comment: MAX A on first sit to stand trial with RW, MOD/MAX A with arm in arm SPS from EOB to recliner.    Balance Overall balance assessment: Needs assistance Sitting-balance support: Single extremity supported;Feet supported Sitting balance-Leahy Scale: Fair     Standing balance support: Bilateral upper extremity supported Standing balance-Leahy Scale: Poor Standing balance comment: Pt requires at least MOD A to sustain static stand and demos flexed hips/knees in standing with poor posture.                           ADL either performed or assessed with clinical judgement   ADL                                         General ADL Comments: Pt requires MAX A for LB ADLs at this time, setup for seated UB ADLs. MOD/MAX A arm in arm for SPS transfers, RW with MOD/MAX A for sit to stand from elevated surface.     Vision Patient Visual Report: No change from baseline       Perception     Praxis      Pertinent Vitals/Pain Pain Assessment: Faces Faces Pain Scale: Hurts little more Pain Location: states his L hip is hurting from being in bed extended time when OT first presents. Pt reports pain resolved once up to  chair Pain Descriptors / Indicators: Grimacing;Sore Pain Intervention(s): Monitored during session;Repositioned     Hand Dominance Right   Extremity/Trunk Assessment Upper Extremity Assessment Upper Extremity Assessment: Generalized weakness   Lower Extremity Assessment Lower Extremity Assessment: Generalized weakness       Communication Communication Communication: HOH   Cognition Arousal/Alertness: Awake/alert Behavior During Therapy: WFL for tasks assessed/performed Overall Cognitive Status: No family/caregiver present to determine baseline cognitive functioning                                 General Comments:  Some information from pt is inconsistent. Pt appropriate conversationally and follows commands well, but states that no one has answered his call light, but OT observered his call light being answered when at nurses station and confirms that button is working x2 trials.   General Comments       Exercises Other Exercises Other Exercises: OT facilitates education re: role of OT in acute setting. Pt with moderate reception. Other Exercises: OT facilitates education re: importance of OOB activity and pt very interested and agreeable to getting OOB   Shoulder Instructions      Home Living Family/patient expects to be discharged to:: Private residence Living Arrangements: Alone Available Help at Discharge: Family;Available PRN/intermittently(3 sons, dtr and DIL check on pt regularly) Type of Home: House Home Access: Level entry     Home Layout: One level     Bathroom Shower/Tub: Teacher, early years/pre: Standard     Home Equipment: Environmental consultant - 4 wheels;Cane - single point;Grab bars - tub/shower;Grab bars - toilet;Tub bench;Wheelchair - manual   Additional Comments: pt states that his family lives close and checks on him frequently, otherwise lives alone.      Prior Functioning/Environment Level of Independence: Needs assistance  Gait / Transfers Assistance Needed: Pt reports ambulating with rollator at home. ADL's / Homemaking Assistance Needed: Pt reports he was doing BADLs I'ly. States his DIL assists with paying bills, getting groceries, and med mgt.   Comments: Pt reports his 4 children are in and out all day and his daughter in law also assists him.        OT Problem List: Decreased strength;Decreased activity tolerance;Impaired balance (sitting and/or standing);Decreased knowledge of use of DME or AE;Decreased knowledge of precautions;Cardiopulmonary status limiting activity;Pain      OT Treatment/Interventions: Self-care/ADL training;Therapeutic exercise;Energy  conservation;DME and/or AE instruction;Therapeutic activities;Patient/family education;Balance training    OT Goals(Current goals can be found in the care plan section) Acute Rehab OT Goals Patient Stated Goal: to improve strength and mobility OT Goal Formulation: With patient Time For Goal Achievement: 01/10/20 Potential to Achieve Goals: Good  OT Frequency: Min 2X/week   Barriers to D/C: Decreased caregiver support          Co-evaluation              AM-PAC OT "6 Clicks" Daily Activity     Outcome Measure Help from another person eating meals?: None Help from another person taking care of personal grooming?: A Little Help from another person toileting, which includes using toliet, bedpan, or urinal?: A Lot Help from another person bathing (including washing, rinsing, drying)?: A Lot Help from another person to put on and taking off regular upper body clothing?: A Little Help from another person to put on and taking off regular lower body clothing?: A Lot 6 Click Score: 16   End of Session Equipment Utilized  During Treatment: Gait belt;Rolling walker Nurse Communication: Mobility status  Activity Tolerance: Patient tolerated treatment well Patient left: in chair;with call bell/phone within reach;with chair alarm set;Other (comment)(with RT present for second breathing treatment)  OT Visit Diagnosis: Unsteadiness on feet (R26.81);Muscle weakness (generalized) (M62.81)                Time: [id number] OT Time Calculation (min): 53 min Charges:  OT General Charges $OT Visit: 1 Visit OT Evaluation $OT Eval Moderate Complexity: 1 Mod OT Treatments $Self Care/Home Management : 23-37 mins $Therapeutic Activity: 8-22 mins  Gerrianne Scale, MS, OTR/L ascom [phone] 12/27/19, 6:26 PM

## 2019-12-27 NOTE — Progress Notes (Signed)
Progress Note  Patient Name: Charles [name] Date of Encounter: 12/27/2019  Primary Cardiologist: [name], MD/ CHF: [name], MD   Subjective   No chest pain.  Says he's breathing well but audibly wheezing.   Inpatient Medications    Scheduled Meds: . amiodarone  200 mg Oral Daily  . apixaban  2.5 mg Oral BID  . vitamin C  500 mg Oral Daily  . atorvastatin  20 mg Oral Daily  . citalopram  10 mg Oral Daily  . finasteride  5 mg Oral Daily  . insulin aspart  0-20 Units Subcutaneous Q4H  . insulin glargine  15 Units Subcutaneous q morning - 10a   And  . insulin glargine  15 Units Subcutaneous QHS  . magnesium oxide  200 mg Oral Daily  . methylPREDNISolone (SOLU-MEDROL) injection  40 mg Intravenous Q8H  . pantoprazole  40 mg Oral Daily  . traZODone  50 mg Oral QHS   Continuous Infusions: . sodium chloride 10 mL/hr at 12/26/19 1826  . azithromycin Stopped (12/26/19 1742)   PRN Meds: acetaminophen **OR** acetaminophen, guaiFENesin, ipratropium-albuterol, nitroGLYCERIN, ondansetron **OR** ondansetron (ZOFRAN) IV, traMADol   Vital Signs    Vitals:   12/26/19 1658 12/26/19 1946 12/27/19 0455 12/27/19 0812  BP: (!) 161/92 (!) 142/81 (!) 158/92 (!) 168/85  Pulse: 92 100 92 89  Resp: 18   16  Temp: 98.4 F (36.9 C) 98.3 F (36.8 C) 97.8 F (36.6 C) 98.5 F (36.9 C)  TempSrc: Axillary Oral Oral   SpO2: 97% 97% 98% 98%  Weight:   104.6 kg   Height:        Intake/Output Summary (Last 24 hours) at 12/27/2019 0910 Last data filed at 12/27/2019 0524 Gross per 24 hour  Intake 1914.06 ml  Output 1950 ml  Net -35.94 ml   Filed Weights   12/25/19 2232 12/26/19 0615 12/27/19 0455  Weight: 103.6 kg 104.1 kg 104.6 kg    Physical Exam   GEN: Well nourished, well developed, in no acute distress.  HEENT: Grossly normal.  Neck: Supple, no JVD, carotid bruits, or masses. Cardiac: RRR, 2/6 SEM @ upper sternal borders and apex, no rubs, or gallops. No clubbing,  cyanosis. Trace bilat LE woody edema.  Radials/DP/PT 1+ and equal bilaterally.  Respiratory:  Respirations regular and unlabored, Exp wheezing throughout - heard w/o stethoscope. GI: Semi-firm/protuberant, nontender, BS + x 4. MS: no deformity or atrophy. Skin: warm and dry, no rash. Neuro:  Strength and sensation are intact. Psych: AAOx3.  Normal affect.  Labs    Chemistry Recent Labs  Lab 12/25/19 1508 12/26/19 0039 12/27/19 0601  NA 138 142 143  K 4.6 4.0 4.3  CL 96* 101 104  CO2 29 31 32  GLUCOSE 327* 224* 152*  BUN 68* 62* 45*  CREATININE 2.22* 1.94* 1.54*  CALCIUM 9.6 9.1 9.6  PROT 7.9  --   --   ALBUMIN 4.0  --   --   AST 21  --   --   ALT 13  --   --   ALKPHOS 75  --   --   BILITOT 1.0  --   --   GFRNONAA 26* 31* 41*  GFRAA 30* 36* 47*  ANIONGAP _0 Hematology Recent Labs  Lab 12/25/19 1508 12/26/19 0039 12/27/19 0601  WBC 8.1 7.4 7.5  RBC 3.55* 3.32* 3.32*  HGB 10.5* 9.8* 9.8*  HCT 32.8* 31.1* 31.4*  MCV 92.4 93.7  94.6  MCH 29.6 29.5 29.5  MCHC 32.0 31.5 31.2  RDW 13.8 13.8 13.8  PLT 179 182 194    Cardiac Enzymes  Recent Labs  Lab 12/19/19 1611 12/25/19 1508 12/25/19 1714 12/25/19 2236  TROPONINIHS 54* 110* 116* 143*      Radiology    CT Head Wo Contrast  Result Date: 12/25/2019 CLINICAL DATA:  86 year old male with head trauma. EXAM: CT HEAD WITHOUT CONTRAST CT CERVICAL SPINE WITHOUT CONTRAST TECHNIQUE: Multidetector CT imaging of the head and cervical spine was performed following the standard protocol without intravenous contrast. Multiplanar CT image reconstructions of the cervical spine were also generated. COMPARISON:  None. FINDINGS: CT HEAD FINDINGS Brain: There is moderate age-related atrophy and chronic microvascular ischemic changes. Patchy area of white matter hypodensity in the right posterior parietal convexity, likely chronic microvascular ischemic changes. Small focal area of old infarct noted in the right temporal  lobe. There is no acute intracranial hemorrhage. No mass effect or midline shift. No extra-axial fluid collection. Vascular: No hyperdense vessel or unexpected calcification. Skull: Normal. Negative for fracture or focal lesion. Sinuses/Orbits: No acute finding. Other: None CT CERVICAL SPINE FINDINGS Alignment: No acute subluxation. Skull base and vertebrae: No acute fracture. Osteopenia. Soft tissues and spinal canal: No prevertebral fluid or swelling. No visible canal hematoma. Disc levels: Multilevel degenerative changes with endplate irregularity and disc space narrowing and bone spurring and osteophyte. Upper chest: Negative. Other: Bilateral carotid bulb calcified plaques. Partially visualized pacemaker wire. IMPRESSION: 1. No acute intracranial hemorrhage. 2. Age-related atrophy and chronic microvascular ischemic changes. 3. No acute/traumatic cervical spine pathology. Electronically Signed   By: Anner Crete M.D.   On: 12/25/2019 16:15   CT Cervical Spine Wo Contrast  Result Date: 12/25/2019 CLINICAL DATA:  86 year old male with head trauma. EXAM: CT HEAD WITHOUT CONTRAST CT CERVICAL SPINE WITHOUT CONTRAST TECHNIQUE: Multidetector CT imaging of the head and cervical spine was performed following the standard protocol without intravenous contrast. Multiplanar CT image reconstructions of the cervical spine were also generated. COMPARISON:  None. FINDINGS: CT HEAD FINDINGS Brain: There is moderate age-related atrophy and chronic microvascular ischemic changes. Patchy area of white matter hypodensity in the right posterior parietal convexity, likely chronic microvascular ischemic changes. 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 There is no acute intracranial hemorrhage. No mass effect or midline shift. No extra-axial fluid collection. Vascular: No hyperdense vessel or unexpected calcification. Skull: Normal. Negative for fracture or focal lesion. Sinuses/Orbits: No acute  finding. Other: None CT CERVICAL SPINE FINDINGS Alignment: No acute subluxation. Skull base and vertebrae: No acute fracture. Osteopenia. Soft tissues and spinal canal: No prevertebral fluid or swelling. No visible canal hematoma. Disc levels: Multilevel degenerative changes with endplate irregularity and disc space narrowing and bone spurring and osteophyte. Upper chest: Negative. Other: Bilateral carotid bulb calcified plaques. Partially visualized pacemaker wire. IMPRESSION: 1. No acute intracranial hemorrhage. 2. Age-related atrophy and chronic microvascular ischemic changes. 3. No acute/traumatic cervical spine pathology. Electronically Signed   By: Anner Crete M.D.   On: 12/25/2019 16:15   DG Chest Portable 1 View  Result Date: 12/25/2019 CLINICAL DATA:  Fall. EXAM: PORTABLE CHEST 1 VIEW COMPARISON:  September 06, 2019. FINDINGS: Stable cardiomegaly. Status post coronary bypass graft. Left-sided pacemaker is unchanged in position. No pneumothorax is noted. Elevated left hemidiaphragm is noted. Mild bibasilar subsegmental atelectasis is noted. Bony thorax is unremarkable. IMPRESSION: Mild bibasilar subsegmental atelectasis. Aortic Atherosclerosis (ICD10-I70.0). Electronically Signed   By: Jeneen Rinks  Murlean Caller M.D.   On: 12/25/2019 16:29   DG Knee Complete 4 Views Right  Result Date: 12/25/2019 CLINICAL DATA:  Pain following fall EXAM: RIGHT KNEE - COMPLETE 4+ VIEW COMPARISON:  December 19, 2019 FINDINGS: Frontal, lateral, and bilateral oblique views were obtained. Bones appear somewhat osteoporotic. There is no evident fracture or dislocation. The small joint effusion noted on recent prior study has nearly completely resolved. There is generalized joint space narrowing with spurring in all compartments, stable. No erosive change. There is extensive arterial vascular calcification in the popliteal, distal superficial femoral, and proximal trifurcation arterial vessels. IMPRESSION: Interval near complete  resolution of small joint effusion. No fracture or dislocation. Generalized osteoarthritic change, stable. Bones osteoporotic. Extensive arterial vascular calcification noted. Electronically Signed   By: Lowella Grip III M.D.   On: 12/25/2019 16:29   CT Renal Stone Study  Result Date: 12/25/2019 CLINICAL DATA:  Status post fall. EXAM: CT ABDOMEN AND PELVIS WITHOUT CONTRAST TECHNIQUE: Multidetector CT imaging of the abdomen and pelvis was performed following the standard protocol without IV contrast. COMPARISON:  August 10, 2019 FINDINGS: Lower chest: Multiple sternal wires are seen. An artificial aortic valve is present. There is marked severity coronary artery calcification. Mild to moderate severity atelectasis and/or infiltrate is seen within the bilateral lung bases. Hepatobiliary: A punctate calcified granuloma is seen within the right lobe of the liver. No gallstones, gallbladder wall thickening, or biliary dilatation. Pancreas: Unremarkable. No pancreatic ductal dilatation or surrounding inflammatory changes. Spleen: Normal in size without focal abnormality. Adrenals/Urinary Tract: A 1.4 cm low-attenuation right adrenal mass is seen. The left adrenal gland is normal in appearance. Kidneys are normal in size, without renal calculi or hydronephrosis. Multiple small bilateral renal cysts are seen. Bladder is unremarkable. Stomach/Bowel: Stomach is within normal limits. The appendix is not identified. No evidence of bowel wall thickening, distention, or inflammatory changes. Noninflamed diverticula are seen throughout the descending and sigmoid colon. Vascular/Lymphatic: Marked severity aortic calcification. No enlarged abdominal or pelvic lymph nodes. Reproductive: The prostate gland is mildly enlarged. A moderate to marked amount of prostate gland calcification is seen. There is a moderate size left-sided hydrocele which extends superiorly into the left inguinal region. Other: No abdominal wall hernia  or abnormality. No abdominopelvic ascites. Musculoskeletal: A chronic appearing compression fracture deformity is seen at the level of the L1 vertebral body. Marked severity multilevel degenerative changes are noted throughout the lumbar spine. IMPRESSION: 1. Mild to moderate severity bibasilar atelectasis and/or infiltrate. 2. Diverticulosis without evidence of diverticulitis. 3. Moderate sized left-sided hydrocele which extends superiorly into the left inguinal region. 4. Marked severity coronary artery calcification. Aortic Atherosclerosis (ICD10-I70.0). Electronically Signed   By: Virgina Norfolk M.D.   On: 12/25/2019 16:27    Telemetry    A sensed, V paced 89 - Personally Reviewed  Cardiac Studies   2D Echocardiogram 2.2021  1. Left ventricular ejection fraction, by estimation, is 60 to 65%. The  left ventricle has normal function.   2. Pacing wire in RA/RV. Right ventricular systolic function is normal.  There is moderately elevated pulmonary artery systolic pressure.   3. Mild functional MS due to MAC mean gradient increased from 6 mmhg to  10 mmHg since echo done 08/14/19 . The mitral valve is degenerative. Mild  mitral valve regurgitation. Mild mitral stenosis.   4. Post TAVR with 26 mm Sapien 3 no PVL. Mild change since 08/14/19 with  mean gradient increasing from 9-11 mmHg peak 17->23 mmHg and AVA 2.0 ->  1.54 cm2 DVI remains similar at 0.44-> 0.45.   5. Aortic dilatation noted. There is mild dilatation of the aortic root  measuring 38 mm.    Patient Profile     86 y.o. male with a history of HFpEF, CAD s/p CABG x 4 (2004), AS s/p TAVR (12/2018), Paroxysmal atrial fib/flutter on amio/eliquis, high grade heart block s/p PPM (04/2018), HTN, HL, COVID 19 infection 09/2019, carotid stenosis s/p L CA, DMII, stage III chronic kidney disease, and renal artery stenosis, who is being seen today for the evaluation of weakness/elevated HsTroponin at the request of Dr.  Jimmye Norman.  Assessment & Plan    1.  CAD/Elevated HsTrop:  H/o CAD s/p CABG x 4 w/ 4/4 patents grafts on most recent cath in early 2019.  Echo in Feb w/ nl EF.  Admitted w/ weakness/deconditioning and AECOPD.  No chest pain.  HsTrop, which is chronically elevated, was sl higher on admission @ 110   116  143.  This does not represent ACS - more likely demand ischemia in the setting of AECOPD and AKI.  Cont conservative mgmt w/ statin.  No ASA in setting of chronic eliquis. No plans for ischemic eval @ this time.  2. HFpEF:  Echo in 11/2019 w/ nl EF, mild MS/MR, and nl fxn'ing bioprosthetic AoV.  Wt up sl this AM after no diuretics x 2 days.  Mild lower ext woody edema.  Creat better after IVF following admission (reduced to [facility] 3/24).  Will resume torsemide 41m BID (prior home dose).  Also on metolazone 527mweekly @ home - will hold off for right now.  3.  Severe Aortic stenosis: s/p TAVR.  Relatively stable echo findings in Feb.  4.  Essential HTN:  BP elevated.  Resuming torsemide.  Provided renal fxn stable, prob resume home dose of lisinopril tomorrow.  5.  HL:  Cont statin rx  inc to 40 mg daily. LDL 72 09/2019.   6.  PAF/Flutter:  Maintaining sinus. Cont amio/eliquis. [facility] as he has recently been falling.  7.  Type II DM:  Per IM.  8.  COPD:  Actively wheezing.  Steroids/abx/nebs per IM.  Encouraged pt to ask for prn nebs when he's feeling sob.  9.  Acute on chronic stage III kidney dzs:  Improved after hydration.  Follow w/ resumption of torsemide.  10.  Deconditioning:  Needs PT/OT to determine ability to return home.  Signed, ChMurray HodgkinsNP  12/27/2019, 9:10 AM    For questions or updates, please contact   Please consult [email or web address] for contact info under Cardiology/STEMI.

## 2019-12-27 NOTE — Evaluation (Signed)
Physical Therapy Evaluation Patient Details Name: Charles [name] MRN: [id number] DOB: 1931/10/28 Today's Date: 12/27/2019   History of Present Illness  Pt is an 86 y.o. male presenting to hospital 12/25/19 with increasing weakness; per chart pt has been declining since Covid in December and continued to decline after recent fall.  Pt admitted with AKI, COPD exacerbation, and elevated troponin.  Per chart pt seen in ED 3/17 after a fall and sustained R knee contusion.  PMH includes CAD s/p CABG, CHF, CKD, s/p TAVR, L1 compression fx, COVID-19 (+) 09/06/19, pacemaker, a-flutter.  Clinical Impression  Prior to hospital admission, pt reports being ambulatory with rollator although per chart review pt appears to have had decline in status after recent fall.  Pt reports living alone but family is in/out all through the day to assist as needed.  Currently pt is 1-2 assist with bed mobility and CGA to close SBA for safety sitting edge of bed.  Pt noted with postural sway in sitting and pt reporting feeling "woozy" and "drunk" in sitting (minimal improvement of symptoms noted while sitting edge of bed; BP 153/95; HR and O2 sats on room air [facility]; nurse present and aware).  Deferred further mobility d/t safety concerns with pt's symptoms.  Pt noted with generalized weakness and decreased activity tolerance; also audible wheezing noted during session (nurse present and aware).  Pt would benefit from skilled PT to address noted impairments and functional limitations (see below for any additional details).  Upon hospital discharge, d/t pt's recent decline in status, anticipate pt would benefit from STR.  If pt chooses to discharge home instead, recommend increased home services (including HHPT) and 24/7 assist.    Follow Up Recommendations SNF    Equipment Recommendations  Wheelchair (measurements PT);Wheelchair cushion (measurements PT);Rolling walker with 5" wheels;3in1 (PT);Hospital bed    Recommendations for  Other Services OT consult     Precautions / Restrictions Precautions Precautions: Fall Restrictions Weight Bearing Restrictions: No      Mobility  Bed Mobility Overal bed mobility: Needs Assistance Bed Mobility: Supine to Sit;Sit to Supine     Supine to sit: Mod assist;Max assist;HOB elevated Sit to supine: +2 for physical assistance;HOB elevated   General bed mobility comments: assist for trunk > LE's semi-supine to sitting edge of bed; 2 assist sit to supine and to scoot up in bed using bed sheets  Transfers                 General transfer comment: Deferred d/t safety concerns: pt reporting feeling "woozy" and "drunk" sitting edge of bed (postural sway noted in sitting)  Ambulation/Gait                Stairs            Wheelchair Mobility    Modified Rankin (Stroke Patients Only)       Balance Overall balance assessment: Needs assistance Sitting-balance support: Single extremity supported;Feet supported Sitting balance-Leahy Scale: Fair Sitting balance - Comments: pt noted with postural sway in sitting and reporting feeling "woozy" and "drunk" in sitting                                     Pertinent Vitals/Pain Pain Assessment: No/denies pain    Home Living Family/patient expects to be discharged to:: Private residence Living Arrangements: Alone Available Help at Discharge: Family;Available PRN/intermittently Type of Home: House Home Access: Level entry  Home Layout: One level Home Equipment: [place] - 4 wheels;Cane - single point;Grab bars - tub/shower;Grab bars - toilet;Tub bench;Wheelchair - manual      Prior Function Level of Independence: Needs assistance   Gait / Transfers Assistance Needed: Pt reports ambulating with rollator at home.     Comments: Pt reports his 4 children are in and out all day and his daughter in law also assists him.     Hand Dominance        Extremity/Trunk Assessment   Upper  Extremity Assessment Upper Extremity Assessment: Generalized weakness    Lower Extremity Assessment Lower Extremity Assessment: Generalized weakness    Cervical / Trunk Assessment Cervical / Trunk Assessment: (forward head/shoulders)  Communication   Communication: HOH  Cognition Arousal/Alertness: Awake/alert Behavior During Therapy: WFL for tasks assessed/performed Overall Cognitive Status: No family/caregiver present to determine baseline cognitive functioning                                 General Comments: Pt inconsistent with information at times.  Pt reports being on 4 L O2 at home all the time.      General Comments   Nursing cleared pt for participation in physical therapy.  Pt agreeable to PT session.    Exercises     Assessment/Plan    PT Assessment Patient needs continued PT services  PT Problem List Decreased strength;Decreased activity tolerance;Decreased balance;Decreased mobility;Decreased knowledge of use of DME;Decreased knowledge of precautions       PT Treatment Interventions DME instruction;Gait training;Functional mobility training;Therapeutic activities;Therapeutic exercise;Balance training;Patient/family education    PT Goals (Current goals can be found in the Care Plan section)  Acute Rehab PT Goals Patient Stated Goal: to improve strength and mobility PT Goal Formulation: With patient Time For Goal Achievement: 01/10/20 Potential to Achieve Goals: Fair    Frequency Min 2X/week   Barriers to discharge Other (comment) Question level of assist available at home    Co-evaluation               AM-PAC PT "6 Clicks" Mobility  Outcome Measure Help needed turning from your back to your side while in a flat bed without using bedrails?: A Little Help needed moving from lying on your back to sitting on the side of a flat bed without using bedrails?: A Lot Help needed moving to and from a bed to a chair (including a wheelchair)?:  Total Help needed standing up from a chair using your arms (e.g., wheelchair or bedside chair)?: Total Help needed to walk in hospital room?: Total Help needed climbing 3-5 steps with a railing? : Total 6 Click Score: 9    End of Session Equipment Utilized During Treatment: Gait belt Activity Tolerance: Other (comment)(Limited d/t feeling "woozy" and "drunk" in sitting) Patient left: in bed;with call bell/phone within reach;with bed alarm set Nurse Communication: Mobility status;Precautions(Nurse present during mobility portion of session and aware of pt's symptoms) PT Visit Diagnosis: Other abnormalities of gait and mobility (R26.89);Muscle weakness (generalized) (M62.81);History of falling (Z91.81);Difficulty in walking, not elsewhere classified (R26.2)    Time: [id number] PT Time Calculation (min) (ACUTE ONLY): 27 min   Charges:   PT Evaluation $PT Eval Low Complexity: 1 Low PT Treatments $Therapeutic Exercise: 8-22 mins       Leitha Bleak, PT 12/27/19, 12:53 PM

## 2019-12-27 NOTE — NC FL2 (Signed)
[place] LEVEL OF CARE SCREENING TOOL     IDENTIFICATION  Patient Name: Charles [name] Birthdate: Jan 17, 1932 Sex: male Admission Date (Current Location): 12/25/2019  [place] and [place] Number:  Engineering geologist and Address:  [facility], [address] [place], [place] [address]      Provider Number: [id number]  Attending Physician Name and Address:  Wyvonnia Dusky, MD  Relative Name and Phone Number:  Duff Franchini [phone]    Current Level of Care: Hospital Recommended Level of Care: [place] Prior Approval Number:    Date Approved/Denied: 12/27/19 PASRR Number: [id number]  Discharge Plan: SNF    Current Diagnoses: Patient Active Problem List   Diagnosis Date Noted  . Pressure injury of skin 12/26/2019  . Atrial fibrillation ([place])   . Right leg pain 12/25/2019  . COPD exacerbation ([place]) 12/25/2019  . Centrilobular emphysema ([place]) 10/29/2019  . Acute on chronic diastolic (congestive) heart failure ([place]) 10/08/2019  . Hypotension 10/04/2019  . Protein-calorie malnutrition, moderate ([place]) 10/04/2019  . DNR (do not resuscitate) present on admission 09/08/2019  . Chronic respiratory failure ([place]) 07/19/2019  . CKD stage 3 due to type 2 diabetes mellitus ([place]) 12/27/2018  . S/P TAVR (transcatheter aortic valve replacement)   . S/P placement of cardiac pacemaker  04/22/2018  . Troponin level elevated 03/25/2018  . Paroxysmal atrial flutter ([place])   . Scrotal swelling 02/16/2018  . Coronary artery disease involving native coronary artery of native heart without angina pectoris   . Chronic heart failure with preserved ejection fraction (HFpEF) ([place]) 09/06/2017  . BPH (benign prostatic hyperplasia) 07/01/2017  . Chronic anticoagulation 11/05/2016  . Chronic radicular pain of lower back 02/17/2016  . Aortic stenosis, severe 06/04/2014  . Weakness 04/02/2014  . DDD (degenerative disc disease), lumbar  10/02/2013  . Ex-smoker   . Carotid arterial disease ([place]) 08/23/2013  . MDD (major depressive disorder), recurrent episode, moderate ([place]) 09/21/2007  . Osteoarthritis 09/21/2007  . COLONIC POLYPS 01/31/2007  . Diabetes mellitus type 2, uncontrolled, with complications ([place]) [id number]  . HYPERCHOLESTEROLEMIA 01/31/2007  . ERECTILE DYSFUNCTION 01/31/2007  . Essential hypertension 01/31/2007  . GERD 01/31/2007  . PSORIASIS 01/31/2007    Orientation RESPIRATION BLADDER Height & Weight     Self, Time, Situation, Place  Normal External catheter Weight: 104.6 kg Height:  6\' 1"  (185.4 cm)  BEHAVIORAL SYMPTOMS/MOOD NEUROLOGICAL BOWEL NUTRITION STATUS      Continent Diet(Carb Modified)  AMBULATORY STATUS COMMUNICATION OF NEEDS Skin   Limited Assist Verbally Normal                       Personal Care Assistance Level of Assistance  Dressing, Feeding, Bathing Bathing Assistance: Limited assistance Feeding assistance: Limited assistance Dressing Assistance: Limited assistance     Functional Limitations Info             SPECIAL CARE FACTORS FREQUENCY  OT (By licensed OT), PT (By licensed PT)                    Contractures Contractures Info: Not present    Additional Factors Info  Code Status, Allergies Code Status Info: DNR Allergies Info: Gabapentin, Metoprolol, Spironolactone, Amlodipine, Triclor, Rosiglitazone           Current Medications (12/27/2019):  This is the current hospital active medication list Current Facility-Administered Medications  Medication Dose Route Frequency Provider Last Rate Last Admin  . 0.9 %  sodium chloride  infusion   Intravenous Continuous Theora Gianotti, NP 10 mL/hr at 12/26/19 1826 Rate Verify at 12/26/19 1826  . acetaminophen (TYLENOL) tablet 650 mg  650 mg Oral Q6H PRN Mansy, Jan A, MD       Or  . acetaminophen (TYLENOL) suppository 650 mg  650 mg Rectal Q6H PRN Mansy, Jan A, MD      . amiodarone (PACERONE)  tablet 200 mg  200 mg Oral Daily Mansy, Jan A, MD   200 mg at 12/27/19 1044  . apixaban (ELIQUIS) tablet 2.5 mg  2.5 mg Oral BID Mansy, Jan A, MD   2.5 mg at 12/27/19 1044  . ascorbic acid (VITAMIN C) tablet 500 mg  500 mg Oral Daily Mansy, Jan A, MD   500 mg at 12/27/19 1044  . atorvastatin (LIPITOR) tablet 20 mg  20 mg Oral Daily Mansy, Jan A, MD   20 mg at 12/26/19 1639  . azithromycin (ZITHROMAX) 500 mg in sodium chloride 0.9 % 250 mL IVPB  500 mg Intravenous Q24H Wyvonnia Dusky, MD   Stopped at 12/26/19 1742  . citalopram (CELEXA) tablet 10 mg  10 mg Oral Daily Mansy, Jan A, MD   10 mg at 12/27/19 1044  . finasteride (PROSCAR) tablet 5 mg  5 mg Oral Daily Mansy, Jan A, MD   5 mg at 12/27/19 1044  . guaiFENesin (MUCINEX) 12 hr tablet 600 mg  600 mg Oral BID PRN Mansy, Jan A, MD      . insulin aspart (novoLOG) injection 0-20 Units  0-20 Units Subcutaneous Q4H Mansy, Arvella Merles, MD   11 Units at 12/27/19 1241  . insulin glargine (LANTUS) injection 15 Units  15 Units Subcutaneous q morning - 10a Mansy, Jan A, MD   15 Units at 12/27/19 1044   And  . insulin glargine (LANTUS) injection 15 Units  15 Units Subcutaneous QHS Mansy, Arvella Merles, MD   15 Units at 12/26/19 0007  . ipratropium-albuterol (DUONEB) 0.5-2.5 (3) MG/3ML nebulizer solution 3 mL  3 mL Nebulization Q4H Gollan, Kathlene November, MD   3 mL at 12/27/19 1135  . magnesium oxide (MAG-OX) tablet 200 mg  200 mg Oral Daily Mansy, Jan A, MD   200 mg at 12/27/19 1044  . methylPREDNISolone sodium succinate (SOLU-MEDROL) 40 mg/mL injection 40 mg  40 mg Intravenous Q6H Wyvonnia Dusky, MD   40 mg at 12/27/19 1241  . nitroGLYCERIN (NITROSTAT) SL tablet 0.4 mg  0.4 mg Sublingual Q5 min PRN Mansy, Jan A, MD      . ondansetron [facility]) tablet 4 mg  4 mg Oral Q6H PRN Mansy, Jan A, MD       Or  . ondansetron [facility]) injection 4 mg  4 mg Intravenous Q6H PRN Mansy, Jan A, MD      . pantoprazole (PROTONIX) EC tablet 40 mg  40 mg Oral Daily Mansy, Jan A, MD   40  mg at 12/27/19 1044  . traMADol (ULTRAM) tablet 50 mg  50 mg Oral Q12H PRN Mansy, Jan A, MD      . traZODone (DESYREL) tablet 50 mg  50 mg Oral QHS Mansy, Jan A, MD   50 mg at 12/26/19 2100     Discharge Medications: Please see discharge summary for a list of discharge medications.  Relevant Imaging Results:  Relevant Lab Results:   Additional Information SS#- [id number]  Shelbie Ammons, RN

## 2019-12-27 NOTE — Progress Notes (Signed)
Pt c/o feeling like something is lodged in throat, pt is not coughing, airway is patent, pt is able to swallow, provided pt with a sip of water and will continue to monitor.

## 2019-12-28 DIAGNOSIS — N183 Chronic kidney disease, stage 3 unspecified: Secondary | ICD-10-CM

## 2019-12-28 LAB — GLUCOSE, CAPILLARY
Glucose-Capillary: 199 mg/dL — ABNORMAL HIGH (ref 70–99)
Glucose-Capillary: 148 mg/dL — ABNORMAL HIGH (ref 70–99)
Glucose-Capillary: 205 mg/dL — ABNORMAL HIGH (ref 70–99)
Glucose-Capillary: 278 mg/dL — ABNORMAL HIGH (ref 70–99)
Glucose-Capillary: 330 mg/dL — ABNORMAL HIGH (ref 70–99)

## 2019-12-28 LAB — CBC
HCT: 28.7 % — ABNORMAL LOW (ref 39.0–52.0)
Hemoglobin: 9 g/dL — ABNORMAL LOW (ref 13.0–17.0)
MCH: 29.7 pg (ref 26.0–34.0)
MCHC: 31.4 g/dL (ref 30.0–36.0)
MCV: 94.7 fL (ref 80.0–100.0)
Platelets: 179 10*3/uL (ref 150–400)
RBC: 3.03 MIL/uL — ABNORMAL LOW (ref 4.22–5.81)
RDW: 13.6 % (ref 11.5–15.5)
WBC: 7.5 10*3/uL (ref 4.0–10.5)
nRBC: 0 % (ref 0.0–0.2)

## 2019-12-28 LAB — BASIC METABOLIC PANEL
Anion gap: 8 (ref 5–15)
BUN: 45 mg/dL — ABNORMAL HIGH (ref 8–23)
CO2: 29 mmol/L (ref 22–32)
Calcium: 9.3 mg/dL (ref 8.9–10.3)
Chloride: 102 mmol/L (ref 98–111)
Creatinine, Ser: 1.55 mg/dL — ABNORMAL HIGH (ref 0.61–1.24)
GFR calc Af Amer: 47 mL/min — ABNORMAL LOW (ref >60)
GFR calc non Af Amer: 40 mL/min — ABNORMAL LOW (ref >60)
Glucose, Bld: 198 mg/dL — ABNORMAL HIGH (ref 70–99)
Potassium: 4 mmol/L (ref 3.5–5.1)
Sodium: 139 mmol/L (ref 135–145)

## 2019-12-28 MED ORDER — DIPHENHYDRAMINE HCL 25 MG PO CAPS
ORAL_CAPSULE | ORAL | Status: AC
  Administered 2019-12-28: 25 mg via ORAL
  Filled 2019-12-28: qty 1

## 2019-12-28 MED ORDER — DOCUSATE SODIUM 100 MG PO CAPS
100.0000 mg | ORAL_CAPSULE | Freq: Two times a day (BID) | ORAL | Status: DC
  Administered 2019-12-28 – 2020-01-01 (×6): 100 mg via ORAL
  Filled 2019-12-28 (×6): qty 1

## 2019-12-28 MED ORDER — METHYLPREDNISOLONE SODIUM SUCC 40 MG IJ SOLR
40.0000 mg | Freq: Three times a day (TID) | INTRAMUSCULAR | Status: DC
  Administered 2019-12-28 – 2019-12-30 (×7): 40 mg via INTRAVENOUS
  Filled 2019-12-28 (×7): qty 1

## 2019-12-28 MED ORDER — BISACODYL 10 MG RE SUPP: 10.0000 mg | Freq: Every day | RECTAL | Status: DC | PRN

## 2019-12-28 MED ORDER — DIPHENHYDRAMINE HCL 25 MG PO CAPS: 25.0000 mg | ORAL_CAPSULE | Freq: Once | ORAL | Status: AC

## 2019-12-28 MED ORDER — IPRATROPIUM-ALBUTEROL 0.5-2.5 (3) MG/3ML IN SOLN
3.0000 mL | Freq: Four times a day (QID) | RESPIRATORY_TRACT | Status: DC
  Administered 2019-12-28 – 2019-12-31 (×13): 3 mL via RESPIRATORY_TRACT
  Filled 2019-12-28 (×13): qty 3

## 2019-12-28 MED ORDER — MAGNESIUM HYDROXIDE 400 MG/5ML PO SUSP
30.0000 mL | Freq: Once | ORAL | Status: AC
  Administered 2019-12-28: 22:00:00 30 mL via ORAL
  Filled 2019-12-28: qty 30

## 2019-12-28 MED ORDER — POLYETHYLENE GLYCOL 3350 17 G PO PACK
17.0000 g | PACK | Freq: Every day | ORAL | Status: DC
  Administered 2019-12-31 – 2020-01-01 (×2): 17 g via ORAL
  Filled 2019-12-28 (×4): qty 1

## 2019-12-28 MED ORDER — ALBUTEROL SULFATE (2.5 MG/3ML) 0.083% IN NEBU: 2.5000 mg | INHALATION_SOLUTION | RESPIRATORY_TRACT | Status: DC | PRN

## 2019-12-28 NOTE — Progress Notes (Signed)
Attempted flutter education, pt sleeping. I will re-attempt later today.

## 2019-12-28 NOTE — Progress Notes (Signed)
NP responds and new order for benadryl 25mg  once.

## 2019-12-28 NOTE — Progress Notes (Signed)
PROGRESS NOTE    Charles [name]  [id number] DOB: 1932-08-10 DOA: 12/25/2019 PCP: Charles Bush, MD       Assessment & Plan:   Active Problems:   Weakness   Troponin level elevated   COPD exacerbation (HCC)   Pressure injury of skin   Atrial fibrillation (HCC)   AKI on CKD stage IIIb: Cr is basically stable. Avoid nephrotoxic meds   COPD exacerbation: continue on IV steroids frequency. Scheduled duonebs. Continue on azithromycin for the anti-inflammatory properties. Encourage incentive spirometry   Elevated troponins: trending up, likely secondary to demand ischemia vs CKD & diastolic CHF as per cardio  DM2: uncontrolled. Continue on SSI w/ accuchecks  Atrial flutter: continue amiodarone and eliquis. Continue on tele  Dyslipidemia: continue on statin  BPH: continue on proscar   Depression: continue celexa.  Generalized weakness: PT/OT recs SNF   DVT prophylaxis: eliquis Code Status: DNR  Family Communication:  Discussed pt's care w/ pt's daughter in law, Charles [name] and answered her questions. Pt's PCP recently ordered the pt to have hospital bed at home as well as palliative care  Disposition Plan: will d/c to SNF likely in 24 hours if pt continues to clinically improve    Consultants:   cardio  Procedures:   Antimicrobials: azithromycin    Subjective: Pt c/o malaise  Objective: Vitals:   12/28/19 0329 12/28/19 0402 12/28/19 0740 12/28/19 0749  BP:  (!) 142/76  (!) 145/82  Pulse:  88 83 83  Resp:   (!) 22   Temp:  98.4 F (36.9 C)  97.9 F (36.6 C)  TempSrc:  Oral  Oral  SpO2: 94% 96% 96% 99%  Weight:  105.9 kg    Height:        Intake/Output Summary (Last 24 hours) at 12/28/2019 0752 Last data filed at 12/28/2019 0122 Gross per 24 hour  Intake 1200 ml  Output 1450 ml  Net -250 ml   Filed Weights   12/26/19 0615 12/27/19 0455 12/28/19 0402  Weight: 104.1 kg 104.6 kg 105.9 kg    Examination:  General exam: Appears lethargic  Respiratory system: diminished breath sounds b/l. Scattered wheezes Cardiovascular system: S1 & S2+. No rubs, gallops or clicks.  Gastrointestinal system: Abdomen is nondistended, soft and nontender. Normal bowel sounds heard. Central nervous system: Alert and awake. Moves all 4 extremities Psychiatry: flat mood and affect     Data Reviewed: I have personally reviewed following labs and imaging studies  CBC: Recent Labs  Lab 12/25/19 1508 12/26/19 0039 12/27/19 0601 12/28/19 0509  WBC 8.1 7.4 7.5 7.5  NEUTROABS 6.3  --   --   --   HGB 10.5* 9.8* 9.8* 9.0*  HCT 32.8* 31.1* 31.4* 28.7*  MCV 92.4 93.7 94.6 94.7  PLT 179 182 194 0000000   Basic Metabolic Panel: Recent Labs  Lab 12/25/19 1508 12/26/19 0039 12/27/19 0601 12/28/19 0509  NA 138 142 143 139  K 4.6 4.0 4.3 4.0  CL 96* 101 104 102  CO2 29 31 32 29  GLUCOSE 327* 224* 152* 198*  BUN 68* 62* 45* 45*  CREATININE 2.22* 1.94* 1.54* 1.55*  CALCIUM 9.6 9.1 9.6 9.3   GFR: Estimated Creatinine Clearance: 44.5 mL/min (A) (by C-G formula based on SCr of 1.55 mg/dL (H)). Liver Function Tests: Recent Labs  Lab 12/25/19 1508  AST 21  ALT 13  ALKPHOS 75  BILITOT 1.0  PROT 7.9  ALBUMIN 4.0   No results for input(s): LIPASE, AMYLASE in the last  168 hours. No results for input(s): AMMONIA in the last 168 hours. Coagulation Profile: No results for input(s): INR, PROTIME in the last 168 hours. Cardiac Enzymes: Recent Labs  Lab 12/25/19 1508  CKTOTAL 216   BNP (last 3 results) Recent Labs    01/26/19 0000 10/08/19 1151  PROBNP 1,162* 2,349*   HbA1C: Recent Labs    12/26/19 0039  HGBA1C 8.4*   CBG: Recent Labs  Lab 12/27/19 1230 12/27/19 1621 12/27/19 2002 12/27/19 2345 12/28/19 0357  GLUCAP 300* 285* 343* 277* 199*   Lipid Profile: No results for input(s): CHOL, HDL, LDLCALC, TRIG, CHOLHDL, LDLDIRECT in the last 72 hours. Thyroid Function Tests: Recent Labs    12/25/19 1508  TSH 3.672  FREET4  1.61*   Anemia Panel: No results for input(s): VITAMINB12, FOLATE, FERRITIN, TIBC, IRON, RETICCTPCT in the last 72 hours. Sepsis Labs: No results for input(s): PROCALCITON, LATICACIDVEN in the last 168 hours.  No results found for this or any previous visit (from the past 240 hour(s)).       Radiology Studies: No results found.      Scheduled Meds: . amiodarone  200 mg Oral Daily  . apixaban  2.5 mg Oral BID  . vitamin C  500 mg Oral Daily  . atorvastatin  20 mg Oral Daily  . citalopram  10 mg Oral Daily  . doxycycline  100 mg Oral Q12H  . finasteride  5 mg Oral Daily  . insulin aspart  0-20 Units Subcutaneous Q4H  . insulin glargine  15 Units Subcutaneous q morning - 10a   And  . insulin glargine  15 Units Subcutaneous QHS  . ipratropium-albuterol  3 mL Nebulization Q4H  . magnesium oxide  200 mg Oral Daily  . methylPREDNISolone (SOLU-MEDROL) injection  40 mg Intravenous Q6H  . pantoprazole  40 mg Oral Daily  . traZODone  50 mg Oral QHS   Continuous Infusions: . sodium chloride 10 mL/hr at 12/26/19 1826     LOS: 3 days    Time spent: 30 mins    Charles Dusky, MD [facility] Pager [phone]  If 7PM-7AM, please contact night-coverage [email or web address] 12/28/2019, 7:52 AM

## 2019-12-28 NOTE — Progress Notes (Signed)
Progress Note  Patient Name: Charles [name] Date of Encounter: 12/28/2019  Primary Cardiologist: [name], MD   Subjective   No significant complaints, Reports appetite good, breathing better today compared to yesterday Legs feel weak, not able to walk very far  Back on torsemide  Inpatient Medications    Scheduled Meds: . amiodarone  200 mg Oral Daily  . apixaban  2.5 mg Oral BID  . vitamin C  500 mg Oral Daily  . atorvastatin  20 mg Oral Daily  . citalopram  10 mg Oral Daily  . doxycycline  100 mg Oral Q12H  . finasteride  5 mg Oral Daily  . insulin aspart  0-20 Units Subcutaneous Q4H  . insulin glargine  15 Units Subcutaneous q morning - 10a   And  . insulin glargine  15 Units Subcutaneous QHS  . ipratropium-albuterol  3 mL Nebulization Q6H  . magnesium oxide  200 mg Oral Daily  . methylPREDNISolone (SOLU-MEDROL) injection  40 mg Intravenous Q8H  . pantoprazole  40 mg Oral Daily  . traZODone  50 mg Oral QHS   Continuous Infusions: . sodium chloride 10 mL/hr at 12/26/19 1826   PRN Meds: acetaminophen **OR** acetaminophen, albuterol, guaiFENesin, nitroGLYCERIN, ondansetron **OR** ondansetron (ZOFRAN) IV, traMADol   Vital Signs    Vitals:   12/28/19 0749 12/28/19 1212 12/28/19 1300 12/28/19 1609  BP: (!) 145/82 (!) 144/80  (!) 161/82  Pulse: 83 91 93 88  Resp:  (!) _0 Temp: 97.9 F (36.6 C) 98.1 F (36.7 C)  98.1 F (36.7 C)  TempSrc: Oral Oral  Oral  SpO2: 99% 96% 95% 97%  Weight:      Height:        Intake/Output Summary (Last 24 hours) at 12/28/2019 1828 Last data filed at 12/28/2019 1617 Gross per 24 hour  Intake 696.03 ml  Output 900 ml  Net -203.97 ml   Last 3 Weights 12/28/2019 12/27/2019 12/26/2019  Weight (lbs) 233 lb 8 oz 230 lb 11.2 oz 229 lb 9.6 oz  Weight (kg) 105.915 kg 104.645 kg 104.146 kg      Telemetry    Normal sinus rhythm- Personally Reviewed  ECG     - Personally Reviewed  Physical Exam   GEN: No acute  distress.   Neck: No JVD Cardiac: RRR, no murmurs, rubs, or gallops.  Respiratory:  Coarse breath sounds bilaterally GI: Soft, nontender, non-distended  MS: No edema; No deformity. Neuro:  Nonfocal  Psych: Normal affect   Labs    High Sensitivity Troponin:   Recent Labs  Lab 12/19/19 1611 12/25/19 1508 12/25/19 1714 12/25/19 2236  TROPONINIHS 54* 110* 116* 143*      Chemistry Recent Labs  Lab 12/25/19 1508 12/25/19 1508 12/26/19 0039 12/27/19 0601 12/28/19 0509  NA 138   < > 142 143 139  K 4.6   < > 4.0 4.3 4.0  CL 96*   < > 101 104 102  CO2 29   < > 31 32 29  GLUCOSE 327*   < > 224* 152* 198*  BUN 68*   < > 62* 45* 45*  CREATININE 2.22*   < > 1.94* 1.54* 1.55*  CALCIUM 9.6   < > 9.1 9.6 9.3  PROT 7.9  --   --   --   --   ALBUMIN 4.0  --   --   --   --   AST 21  --   --   --   --  ALT 13  --   --   --   --   ALKPHOS 75  --   --   --   --   BILITOT 1.0  --   --   --   --   GFRNONAA 26*   < > 31* 41* 40*  GFRAA 30*   < > 36* 47* 47*  ANIONGAP 13   < > _0 < > = values in this interval not displayed.     Hematology Recent Labs  Lab 12/26/19 0039 12/27/19 0601 12/28/19 0509  WBC 7.4 7.5 7.5  RBC 3.32* 3.32* 3.03*  HGB 9.8* 9.8* 9.0*  HCT 31.1* 31.4* 28.7*  MCV 93.7 94.6 94.7  MCH 29.5 29.5 29.7  MCHC 31.5 31.2 31.4  RDW 13.8 13.8 13.6  PLT 182 194 179    BNPNo results for input(s): BNP, PROBNP in the last 168 hours.   DDimer No results for input(s): DDIMER in the last 168 hours.   Radiology    No results found.  Cardiac Studies  Echocardiogram February 2021 1. Left ventricular ejection fraction, by estimation, is 60 to 65%. The  left ventricle has normal function.  2. Pacing wire in RA/RV. Right ventricular systolic function is normal.  There is moderately elevated pulmonary artery systolic pressure.  3. Mild functional MS due to MAC mean gradient increased from 6 mmhg to  10 mmHg since echo done 08/14/19 . The mitral valve is  degenerative. Mild  mitral valve regurgitation. Mild mitral stenosis.  4. Post TAVR with 26 mm Sapien 3 no PVL. Mild change since 08/14/19 with  mean gradient increasing from 9-11 mmHg peak 17->23 mmHg and AVA 2.0 ->  1.54 cm2 DVI remains similar at 0.44-> 0.45.  5. Aortic dilatation noted. There is mild dilatation of the aortic root  measuring 38 mm.    Patient Profile     86 y.o. male with a history of HFpEF, CAD s/p CABG x 4 (2004), AS s/p TAVR (12/2018), Paroxysmal atrial fib/flutter on amio/eliquis, high grade heart block s/p PPM (04/2018), HTN, HL, COVID 19 infection 09/2019, carotid stenosis s/p L CA, DMII,stage III chronic kidney disease,and renal artery stenosis, who is being seen today for the evaluation ofweakness/elevated HsTroponin  Assessment & Plan    CAD/demand ischemia In the setting of respiratory distress, COPD exacerbation, acute kidney injury Minimally elevated troponin x3 Echocardiogram normal ejection fraction February 2021 Denies any chest pain concerning for unstable angina -We will arrange for follow-up in clinic as outpatient  COPD exacerbation Long history of smoking Wheezing and shortness of breath appears somewhat better Responding to steroids antibiotics nebs On chronic oxygen at home  Atrial fibrillation flutter Continue amiodarone, Eliquis Maintaining normal sinus rhythm  History of pulmonary edema/pulmonary hypertension Diuretics held this admission in the setting of renal failure -Metolazone and torsemide discontinued for now Appears we will need to continue to hold diuretics given poor fluid intake, inability to care for himself, ambulate -Restrict fluids to 2 L or less Creatinine 1.55 BUN 45  Anemia Hemoglobin 9 Appears to be trending downward over the past 2 months, previously 11.2 two months ago  Greater than 50% was spent in counseling and coordination of care with patient Total encounter time 25 minutes or more  [facility] will sign off.   Medication Recommendations: Consider continuing to hold torsemide metolazone given poor fluid intake, inability to ambulate Other recommendations (labs, testing, etc): Less than 2 L oral intake per day  Follow up as an outpatient: We will help arrange outpatient follow-up with cardiology  For questions or updates, please contact [place] Please consult [email or web address] for contact info under        Signed, Ida Rogue, MD  12/28/2019, 6:28 PM

## 2019-12-28 NOTE — Plan of Care (Signed)
  Problem: Pain Managment: Goal: General experience of comfort will improve Outcome: Progressing   

## 2019-12-28 NOTE — Care Management Important Message (Signed)
Important Message  Patient Details  Name: Charles [name] MRN: [id number] Date of Birth: Jul 30, 1932   Medicare Important Message Given:  Yes     Dannette Barbara 12/28/2019, 11:46 AM

## 2019-12-28 NOTE — Progress Notes (Signed)
Inpatient Diabetes Program Recommendations  AACE/ADA: New Consensus Statement on Inpatient Glycemic Control (2015)  Target Ranges:  Prepandial:   less than 140 mg/dL      Peak postprandial:   less than 180 mg/dL (1-2 hours)      Critically ill patients:  140 - 180 mg/dL   Lab Results  Component Value Date   GLUCAP 148 (H) 12/28/2019   HGBA1C 8.4 (H) 12/26/2019    Review of Glycemic Control Results for STCLAIR, WILD (MRN [id number]) as of 12/28/2019 11:09  Ref. Range 12/27/2019 16:21 12/27/2019 20:02 12/27/2019 23:45 12/28/2019 03:57 12/28/2019 07:50  Glucose-Capillary Latest Ref Range: 70 - 99 mg/dL 285 (H) 343 (H) 277 (H) 199 (H) 148 (H)   Diabetes history: DM 2 Outpatient Diabetes medications:  Lantus 22 units q AM and Lantus 20 units q PM- Metformin 500 mg bid Current orders for Inpatient glycemic control:  Lantus 15 units bid, Novolog resistant q 4 hours Solumedrol 40 mg IV q 8 hours Inpatient Diabetes Program Recommendations:    Consider increasing Lantus to 20 units bid.  Also consider changing Novolog correction to tid with meals and HS (bedtime scale).  He may need meal coverage to prevent post-prandial increases in blood sugars as well.  Will follow.   Thanks  Adah Perl, RN, BC-ADM Inpatient Diabetes Coordinator Pager [phone] (8a-5p)

## 2019-12-28 NOTE — Plan of Care (Signed)

## 2019-12-28 NOTE — Progress Notes (Signed)
Patient c/o increasing anxiety as well as difficulty falling asleep. Scheduled trazodone administered without effect. Patient requesting " something to help me relax so I can sleep". NP notified awaiting response. Will continue to monitor to ensure comfort and safety.

## 2019-12-29 LAB — GLUCOSE, CAPILLARY
Glucose-Capillary: 106 mg/dL — ABNORMAL HIGH (ref 70–99)
Glucose-Capillary: 125 mg/dL — ABNORMAL HIGH (ref 70–99)
Glucose-Capillary: 220 mg/dL — ABNORMAL HIGH (ref 70–99)
Glucose-Capillary: 261 mg/dL — ABNORMAL HIGH (ref 70–99)
Glucose-Capillary: 263 mg/dL — ABNORMAL HIGH (ref 70–99)
Glucose-Capillary: 288 mg/dL — ABNORMAL HIGH (ref 70–99)

## 2019-12-29 LAB — BASIC METABOLIC PANEL
Anion gap: 8 (ref 5–15)
BUN: 48 mg/dL — ABNORMAL HIGH (ref 8–23)
CO2: 28 mmol/L (ref 22–32)
Calcium: 9.3 mg/dL (ref 8.9–10.3)
Chloride: 101 mmol/L (ref 98–111)
Creatinine, Ser: 1.44 mg/dL — ABNORMAL HIGH (ref 0.61–1.24)
GFR calc Af Amer: 51 mL/min — ABNORMAL LOW (ref >60)
GFR calc non Af Amer: 44 mL/min — ABNORMAL LOW (ref >60)
Glucose, Bld: 119 mg/dL — ABNORMAL HIGH (ref 70–99)
Potassium: 4.5 mmol/L (ref 3.5–5.1)
Sodium: 137 mmol/L (ref 135–145)

## 2019-12-29 LAB — CBC
HCT: 28.6 % — ABNORMAL LOW (ref 39.0–52.0)
Hemoglobin: 9.2 g/dL — ABNORMAL LOW (ref 13.0–17.0)
MCH: 29.7 pg (ref 26.0–34.0)
MCHC: 32.2 g/dL (ref 30.0–36.0)
MCV: 92.3 fL (ref 80.0–100.0)
Platelets: 177 K/uL (ref 150–400)
RBC: 3.1 MIL/uL — ABNORMAL LOW (ref 4.22–5.81)
RDW: 13.6 % (ref 11.5–15.5)
WBC: 8.2 K/uL (ref 4.0–10.5)
nRBC: 0 % (ref 0.0–0.2)

## 2019-12-29 NOTE — Progress Notes (Signed)
Speech Language Pathology Treatment: Dysphagia  Patient Details Name: Charles [name] MRN: [id number] DOB: [birth year] Today's Date: 12/29/2019 Time: 0820-0855 SLP Time Calculation (min) (ACUTE ONLY): 35 min  Assessment / Plan / Recommendation Clinical Impression  Pt seen for ongoing assessment of swallowing and f/u w/ toleration of diet; NSG reported some coughing at meal yesterday when pt was not sitting fully upright in bed. Pt is currently on a mech soft consistency diet w/ thin liquids w/ no straw; aspiration precautions.  Upon entering room, pt was uncomfortable in the bed. Positioning given for better support and upright status. Noted pt had a cup w/ a straw in it for drinking on his tray table(though precautions were posted: no straws). Pt fed self trials of soft solids w/ no overt s/s of aspiration noted; min increased oral phase time for full mastication for swallowing(pt wears Dentures). W/ trials of thin liquids, pt exhibited immediate coughing w/ thin liquids when using a Straw -- NO further use of straw. Pt then consumed ~4 ozs of thin liquids VIA CUP w/ no immediate, overt clinical s/s of aspiration noted - clear vocal quality noted when assessed by SLP. Overall, pt's respiratory effort increased w/ the exertion of the po tasks. Lengthy discussion re: this and the impact of increased breathing on his swallowing completed. Encouraged pt to put his fork/spoon down b/t bites of foods but this was not consistently followed.  Pt given thorough education on aspiration precautions; NOT using Straws when drinking; effects of aspiration including Pulmonary decline/PNA. Educated on the importance of REST BREAKS when eating to reduce SOB/WOB in order to lessen risk for choking/aspiration. Pt agreed verbally. Noted the same conversation given at BSE.  NSG to continue to monitor pt during meal times; aspiration precautions posted in room for pt/NSG. ST services is available for any further needs while  admitted.     HPI HPI: Pt is a 86 y.o. male with coronary disease status post CABG, HOH, CHF, COVID-19 back in December 2020(stated he feels he has not fully recovered from it), CKD, status post TAVR who comes in for increasing weakness.  Patient comes in for inability to care for himself independently.  Pt was recently seen in the ED on 12/19/2019 s/p Fall.  Since then it sounds like patient's been unable to care for himself independently.  He spent the last 5 days in the recliner and not cleaning himself up.  Family is concerned about a foul smell to his urine and increasing weakness.  Patient does wear 3 L of oxygen at baseline.  Head CT: "Moderate Age-related atrophy and chronic microvascular ischemic changes".      SLP Plan  All goals met       Recommendations  Diet recommendations: Dysphagia 3 (mechanical soft);Thin liquid Liquids provided via: Cup;No straw Medication Administration: Whole meds with puree(for safer swallowing) Supervision: Patient able to self feed;Intermittent supervision to cue for compensatory strategies Compensations: Minimize environmental distractions;Slow rate;Small sips/bites;Lingual sweep for clearance of pocketing;Multiple dry swallows after each bite/sip;Follow solids with liquid Postural Changes and/or Swallow Maneuvers: Seated upright 90 degrees;Upright 30-60 min after meal                General recommendations: (Dietician f/u) Oral Care Recommendations: Oral care BID;Oral care before and after PO;Patient independent with oral care;Staff/trained caregiver to provide oral care Follow up Recommendations: None SLP Visit Diagnosis: Dysphagia, unspecified (R13.10) Plan: All goals met       GO  Charles Kenner, MS, CCC-SLP Charles [name] 12/29/2019, 11:29 AM

## 2019-12-29 NOTE — TOC Progression Note (Addendum)
Transition of Care [facility]) - Progression Note    Patient Details  Name: Charles [name] MRN: [id number] Date of Birth: December 12, 1931  Transition of Care [facility]) CM/SW Contact  Marshell Garfinkel, RN Phone Number: 12/29/2019, 12:32 PM  Clinical Narrative:    Patient provided bed offers from [facility], [facility], and [facility]. He picked [facility] aware. TOC to start authorization. Update 1300: Ref # [id number]. Clinicals faxed to [facility] [phone]. Update: spoke with DIL [name] per patient request and she support transition to [facility] when insurance authorizes.   Expected Discharge Plan: [place] Barriers to Discharge: Continued Medical Work up  Expected Discharge Plan and Services Expected Discharge Plan: [place]   Discharge Planning Services: CM Consult   Living arrangements for the past 2 months: Single Family Home                                       Social Determinants of Health (SDOH) Interventions    Readmission Risk Interventions Readmission Risk Prevention Plan 08/14/2019  Transportation Screening Complete  PCP or Specialist Appt within 3-5 Days Complete  HRI or [place] Complete  Social Work Consult for [place] Planning/Counseling Complete  Palliative Care Screening Complete  Medication Review Press photographer) Complete  Some recent data might be hidden

## 2019-12-29 NOTE — Progress Notes (Signed)
PROGRESS NOTE    Charles [name]  [id number] DOB: 23-Jan-1932 DOA: 12/25/2019 PCP: [name], MD       Assessment & Plan:   Active Problems:   Weakness   Troponin level elevated   COPD exacerbation (HCC)   Pressure injury of skin   Atrial fibrillation (HCC)   AKI on CKD stage IIIb: Cr is trending down today. Avoid nephrotoxic meds   COPD exacerbation: continue on IV steroids. Scheduled duonebs. Continue on doxycycline. Encourage incentive spirometry   Chronic hypoxic respiratory failure: continue on supplemental oxygen. Management as stated above  Elevated troponins: trending up, likely secondary to demand ischemia vs CKD & diastolic CHF as per cardio  DM2: uncontrolled. Continue on SSI w/ accuchecks  Atrial flutter: continue amiodarone and eliquis. Continue on tele  Dyslipidemia: continue on statin  BPH: continue on proscar   Depression: continue celexa.  Generalized weakness: PT/OT recs SNF   DVT prophylaxis: eliquis Code Status: DNR  Family Communication:  Discussed pt's care w/ pt's daughter in law, [name] and answered her questions. Pt's PCP recently ordered the pt to have hospital bed at home as well as palliative care  Disposition Plan: stable for d/c to SNF today but waiting on insurance auth   Consultants:   cardio  Procedures:   Antimicrobials: azithromycin    Subjective: Pt c/o fatigue but feeling better than day prior.  Objective: Vitals:   12/29/19 0157 12/29/19 0207 12/29/19 0408 12/29/19 0751  BP:   (!) 151/80 (!) 156/82  Pulse:   87 87  Resp:   20 18  Temp:   98 F (36.7 C) 97.8 F (36.6 C)  TempSrc:   Oral Oral  SpO2:  95% 96% 97%  Weight: 106.9 kg     Height:        Intake/Output Summary (Last 24 hours) at 12/29/2019 0812 Last data filed at 12/29/2019 0414 Gross per 24 hour  Intake 405.68 ml  Output 1350 ml  Net -944.32 ml   Filed Weights   12/27/19 0455 12/28/19 0402 12/29/19 0157  Weight: 104.6 kg  105.9 kg 106.9 kg    Examination:  General exam: Appears lethargic Respiratory system: decreased breath sounds b/l. No rhonchi Cardiovascular system: S1 & S2+. No rubs, gallops or clicks.  Gastrointestinal system: Abdomen is nondistended, soft and nontender. Normal bowel sounds heard. Central nervous system: Alert and awake. Moves all 4 extremities Psychiatry: flat mood and affect     Data Reviewed: I have personally reviewed following labs and imaging studies  CBC: Recent Labs  Lab 12/25/19 1508 12/26/19 0039 12/27/19 0601 12/28/19 0509 12/29/19 0514  WBC 8.1 7.4 7.5 7.5 8.2  NEUTROABS 6.3  --   --   --   --   HGB 10.5* 9.8* 9.8* 9.0* 9.2*  HCT 32.8* 31.1* 31.4* 28.7* 28.6*  MCV 92.4 93.7 94.6 94.7 92.3  PLT 179 182 194 179 [id number]   Basic Metabolic Panel: Recent Labs  Lab 12/25/19 1508 12/26/19 0039 12/27/19 0601 12/28/19 0509 12/29/19 0514  NA 138 142 143 139 137  K 4.6 4.0 4.3 4.0 4.5  CL 96* 101 104 102 101  CO2 29 31 32 29 28  GLUCOSE 327* 224* 152* 198* 119*  BUN 68* 62* 45* 45* 48*  CREATININE 2.22* 1.94* 1.54* 1.55* 1.44*  CALCIUM 9.6 9.1 9.6 9.3 9.3   GFR: Estimated Creatinine Clearance: 48.1 mL/min (A) (by C-G formula based on SCr of 1.44 mg/dL (H)). Liver Function Tests: Recent Labs  Lab 12/25/19  1508  AST 21  ALT 13  ALKPHOS 75  BILITOT 1.0  PROT 7.9  ALBUMIN 4.0   No results for input(s): LIPASE, AMYLASE in the last 168 hours. No results for input(s): AMMONIA in the last 168 hours. Coagulation Profile: No results for input(s): INR, PROTIME in the last 168 hours. Cardiac Enzymes: Recent Labs  Lab 12/25/19 1508  CKTOTAL 216   BNP (last 3 results) Recent Labs    01/26/19 0000 10/08/19 1151  PROBNP 1,162* 2,349*   HbA1C: No results for input(s): HGBA1C in the last 72 hours. CBG: Recent Labs  Lab 12/28/19 1708 12/28/19 2002 12/29/19 0000 12/29/19 0433 12/29/19 0756  GLUCAP 278* 330* 220* 125* 106*   Lipid Profile: No  results for input(s): CHOL, HDL, LDLCALC, TRIG, CHOLHDL, LDLDIRECT in the last 72 hours. Thyroid Function Tests: No results for input(s): TSH, T4TOTAL, FREET4, T3FREE, THYROIDAB in the last 72 hours. Anemia Panel: No results for input(s): VITAMINB12, FOLATE, FERRITIN, TIBC, IRON, RETICCTPCT in the last 72 hours. Sepsis Labs: No results for input(s): PROCALCITON, LATICACIDVEN in the last 168 hours.  No results found for this or any previous visit (from the past 240 hour(s)).       Radiology Studies: No results found.      Scheduled Meds: . amiodarone  200 mg Oral Daily  . apixaban  2.5 mg Oral BID  . vitamin C  500 mg Oral Daily  . atorvastatin  20 mg Oral Daily  . citalopram  10 mg Oral Daily  . docusate sodium  100 mg Oral BID  . doxycycline  100 mg Oral Q12H  . finasteride  5 mg Oral Daily  . insulin aspart  0-20 Units Subcutaneous Q4H  . insulin glargine  15 Units Subcutaneous q morning - 10a   And  . insulin glargine  15 Units Subcutaneous QHS  . ipratropium-albuterol  3 mL Nebulization Q6H  . magnesium oxide  200 mg Oral Daily  . methylPREDNISolone (SOLU-MEDROL) injection  40 mg Intravenous Q8H  . pantoprazole  40 mg Oral Daily  . polyethylene glycol  17 g Oral Daily  . traZODone  50 mg Oral QHS   Continuous Infusions: . sodium chloride 10 mL/hr at 12/26/19 1826     LOS: 4 days    Time spent: 30 mins    Wyvonnia Dusky, MD [facility] Pager [phone]  If 7PM-7AM, please contact night-coverage [email or web address] 12/29/2019, 8:12 AM

## 2019-12-29 NOTE — Plan of Care (Signed)
  Problem: Education: Goal: Knowledge of General Education information will improve Description: Including pain rating scale, medication(s)/side effects and non-pharmacologic comfort measures Outcome: Progressing   Problem: Health Behavior/Discharge Planning: Goal: Ability to manage health-related needs will improve Outcome: Progressing   Problem: Clinical Measurements: Goal: Ability to maintain clinical measurements within normal limits will improve Outcome: Not Progressing Note: BUN remains elevated at 48. Will continue to monitor renal function labs for the remainder of the shift. Wenda Low [facility]

## 2019-12-30 LAB — CBC
HCT: 29.7 % — ABNORMAL LOW (ref 39.0–52.0)
Hemoglobin: 9.6 g/dL — ABNORMAL LOW (ref 13.0–17.0)
MCH: 30.2 pg (ref 26.0–34.0)
MCHC: 32.3 g/dL (ref 30.0–36.0)
MCV: 93.4 fL (ref 80.0–100.0)
Platelets: 179 10*3/uL (ref 150–400)
RBC: 3.18 MIL/uL — ABNORMAL LOW (ref 4.22–5.81)
RDW: 13.8 % (ref 11.5–15.5)
WBC: 9.8 10*3/uL (ref 4.0–10.5)
nRBC: 0.3 % — ABNORMAL HIGH (ref 0.0–0.2)

## 2019-12-30 LAB — GLUCOSE, CAPILLARY
Glucose-Capillary: 105 mg/dL — ABNORMAL HIGH (ref 70–99)
Glucose-Capillary: 123 mg/dL — ABNORMAL HIGH (ref 70–99)
Glucose-Capillary: 138 mg/dL — ABNORMAL HIGH (ref 70–99)
Glucose-Capillary: 169 mg/dL — ABNORMAL HIGH (ref 70–99)
Glucose-Capillary: 265 mg/dL — ABNORMAL HIGH (ref 70–99)
Glucose-Capillary: 273 mg/dL — ABNORMAL HIGH (ref 70–99)
Glucose-Capillary: 319 mg/dL — ABNORMAL HIGH (ref 70–99)

## 2019-12-30 LAB — BASIC METABOLIC PANEL
Anion gap: 6 (ref 5–15)
BUN: 47 mg/dL — ABNORMAL HIGH (ref 8–23)
CO2: 28 mmol/L (ref 22–32)
Calcium: 9.1 mg/dL (ref 8.9–10.3)
Chloride: 105 mmol/L (ref 98–111)
Creatinine, Ser: 1.37 mg/dL — ABNORMAL HIGH (ref 0.61–1.24)
GFR calc Af Amer: 54 mL/min — ABNORMAL LOW (ref >60)
GFR calc non Af Amer: 47 mL/min — ABNORMAL LOW (ref >60)
Glucose, Bld: 116 mg/dL — ABNORMAL HIGH (ref 70–99)
Potassium: 4.9 mmol/L (ref 3.5–5.1)
Sodium: 139 mmol/L (ref 135–145)

## 2019-12-30 MED ORDER — METHYLPREDNISOLONE SODIUM SUCC 40 MG IJ SOLR
40.0000 mg | Freq: Two times a day (BID) | INTRAMUSCULAR | Status: DC
  Administered 2019-12-30 – 2020-01-01 (×4): 40 mg via INTRAVENOUS
  Filled 2019-12-30 (×4): qty 1

## 2019-12-30 NOTE — Plan of Care (Signed)
  Problem: Education: Goal: Knowledge of General Education information will improve Description: Including pain rating scale, medication(s)/side effects and non-pharmacologic comfort measures Outcome: Progressing   Problem: Health Behavior/Discharge Planning: Goal: Ability to manage health-related needs will improve Outcome: Progressing   Problem: Clinical Measurements: Goal: Ability to maintain clinical measurements within normal limits will improve Outcome: Not Progressing Note: Patient's B.U.N. remains elevated at 47 most recently. Will continue to monitor renal function labs for the remainder of the shift. Wenda Low [facility]

## 2019-12-30 NOTE — Progress Notes (Signed)
PROGRESS NOTE    Charles [name]  [id number] DOB: 1931/11/19 DOA: 12/25/2019 PCP: [name], MD       Assessment & Plan:   Active Problems:   Weakness   Troponin level elevated   COPD exacerbation (HCC)   Pressure injury of skin   Atrial fibrillation (HCC)   AKI on CKD stage IIIb: Cr continues to trend down. Avoid nephrotoxic meds. Will continue to monitor   COPD exacerbation: continue on IV steroids, doxycycline. Scheduled duonebs. Encourage incentive spirometry   Chronic hypoxic respiratory failure: continue on supplemental oxygen. Management as stated above  Elevated troponins: trending up, likely secondary to demand ischemia vs CKD & diastolic CHF as per cardio  DM2: uncontrolled. Continue on SSI w/ accuchecks  Atrial flutter: continue amiodarone and eliquis. Continue on tele  Dyslipidemia: continue on statin  BPH: continue on proscar   Depression: continue celexa.  Generalized weakness: PT/OT recs SNF   DVT prophylaxis: eliquis Code Status: DNR  Family Communication:  Pt's PCP recently ordered the pt to have hospital bed at home as well as palliative care as per pt's daughter in law, [name]  Disposition Plan: stable for d/c to SNF today but still waiting on insurance auth   Consultants:   cardio  Procedures:   Antimicrobials: doxycycline   Subjective: Pt c/o not sleeping well last night  Objective: Vitals:   12/29/19 1910 12/29/19 2017 12/30/19 0445 12/30/19 0812  BP: (!) 162/86  (!) 155/79 (!) 167/90  Pulse: 90  86 88  Resp: 20  17   Temp: 98.3 F (36.8 C)  98.1 F (36.7 C) 97.7 F (36.5 C)  TempSrc:   Oral Oral  SpO2: 98% 98% 98% 97%  Weight:   105.8 kg   Height:        Intake/Output Summary (Last 24 hours) at 12/30/2019 0832 Last data filed at 12/30/2019 0823 Gross per 24 hour  Intake 776.72 ml  Output 325 ml  Net 451.72 ml   Filed Weights   12/28/19 0402 12/29/19 0157 12/30/19 0445  Weight: 105.9 kg 106.9 kg  105.8 kg    Examination:  General exam: Appears lethargic Respiratory system: diminished breath sounds b/l. No wheezes Cardiovascular system: S1 & S2+. No rubs, gallops or clicks.  Gastrointestinal system: Abdomen is nondistended, soft and nontender. Hypoactive bowel sounds heard. Central nervous system: Alert and awake. Moves all 4 extremities Psychiatry: flat mood and affect     Data Reviewed: I have personally reviewed following labs and imaging studies  CBC: Recent Labs  Lab 12/25/19 1508 12/25/19 1508 12/26/19 0039 12/27/19 0601 12/28/19 0509 12/29/19 0514 12/30/19 0640  WBC 8.1   < > 7.4 7.5 7.5 8.2 9.8  NEUTROABS 6.3  --   --   --   --   --   --   HGB 10.5*   < > 9.8* 9.8* 9.0* 9.2* 9.6*  HCT 32.8*   < > 31.1* 31.4* 28.7* 28.6* 29.7*  MCV 92.4   < > 93.7 94.6 94.7 92.3 93.4  PLT 179   < > 182 194 179 177 179   < > = values in this interval not displayed.   Basic Metabolic Panel: Recent Labs  Lab 12/26/19 0039 12/27/19 0601 12/28/19 0509 12/29/19 0514 12/30/19 0640  NA 142 143 139 137 139  K 4.0 4.3 4.0 4.5 4.9  CL 101 104 102 101 105  CO2 31 32 29 28 28   GLUCOSE 224* 152* 198* 119* 116*  BUN 62* 45*  45* 48* 47*  CREATININE 1.94* 1.54* 1.55* 1.44* 1.37*  CALCIUM 9.1 9.6 9.3 9.3 9.1   GFR: Estimated Creatinine Clearance: 50.3 mL/min (A) (by C-G formula based on SCr of 1.37 mg/dL (H)). Liver Function Tests: Recent Labs  Lab 12/25/19 1508  AST 21  ALT 13  ALKPHOS 75  BILITOT 1.0  PROT 7.9  ALBUMIN 4.0   No results for input(s): LIPASE, AMYLASE in the last 168 hours. No results for input(s): AMMONIA in the last 168 hours. Coagulation Profile: No results for input(s): INR, PROTIME in the last 168 hours. Cardiac Enzymes: Recent Labs  Lab 12/25/19 1508  CKTOTAL 216   BNP (last 3 results) Recent Labs    01/26/19 0000 10/08/19 1151  PROBNP 1,162* 2,349*   HbA1C: No results for input(s): HGBA1C in the last 72 hours. CBG: Recent Labs    Lab 12/29/19 1710 12/29/19 1919 12/30/19 0014 12/30/19 0535 12/30/19 0813  GLUCAP 263* 261* 138* 105* 123*   Lipid Profile: No results for input(s): CHOL, HDL, LDLCALC, TRIG, CHOLHDL, LDLDIRECT in the last 72 hours. Thyroid Function Tests: No results for input(s): TSH, T4TOTAL, FREET4, T3FREE, THYROIDAB in the last 72 hours. Anemia Panel: No results for input(s): VITAMINB12, FOLATE, FERRITIN, TIBC, IRON, RETICCTPCT in the last 72 hours. Sepsis Labs: No results for input(s): PROCALCITON, LATICACIDVEN in the last 168 hours.  No results found for this or any previous visit (from the past 240 hour(s)).       Radiology Studies: No results found.      Scheduled Meds: . amiodarone  200 mg Oral Daily  . apixaban  2.5 mg Oral BID  . vitamin C  500 mg Oral Daily  . atorvastatin  20 mg Oral Daily  . citalopram  10 mg Oral Daily  . docusate sodium  100 mg Oral BID  . doxycycline  100 mg Oral Q12H  . finasteride  5 mg Oral Daily  . insulin aspart  0-20 Units Subcutaneous Q4H  . insulin glargine  15 Units Subcutaneous q morning - 10a   And  . insulin glargine  15 Units Subcutaneous QHS  . ipratropium-albuterol  3 mL Nebulization Q6H  . magnesium oxide  200 mg Oral Daily  . methylPREDNISolone (SOLU-MEDROL) injection  40 mg Intravenous Q8H  . pantoprazole  40 mg Oral Daily  . polyethylene glycol  17 g Oral Daily  . traZODone  50 mg Oral QHS   Continuous Infusions: . sodium chloride 10 mL/hr at 12/29/19 2125     LOS: 5 days    Time spent: 30 mins    Wyvonnia Dusky, MD [facility] Pager [phone]  If 7PM-7AM, please contact night-coverage [email or web address] 12/30/2019, 8:32 AM

## 2019-12-30 NOTE — Plan of Care (Signed)

## 2019-12-30 NOTE — Plan of Care (Signed)
  Problem: Education: Goal: Knowledge of General Education information will improve Description Including pain rating scale, medication(s)/side effects and non-pharmacologic comfort measures Outcome: Progressing   Problem: Health Behavior/Discharge Planning: Goal: Ability to manage health-related needs will improve Outcome: Progressing   

## 2019-12-30 NOTE — Progress Notes (Signed)
Physical Therapy Treatment Patient Details Name: Charles [name] MRN: [id number] DOB: 1932/08/08 Today's Date: 12/30/2019    History of Present Illness Pt is an 85 y.o. male presenting to hospital 12/25/19 with increasing weakness; per chart pt has been declining since Covid in December and continued to decline after recent fall.  Pt admitted with AKI, COPD exacerbation, and elevated troponin.  Per chart pt seen in ED 3/17 after a fall and sustained R knee contusion.  PMH includes CAD s/p CABG, CHF, CKD, s/p TAVR, L1 compression fx, COVID-19 (+) 09/06/19, pacemaker, a-flutter.    PT Comments    Patient eager for participation with session this date, hopeful for OOB to chair with therapy.  Does endorse persistent dizziness with initial transition to upright, but resolves with accommodation to position.  Vitals stable and WFL (see vitals flowsheet).  Was able to complete sit/stand x3 with RW, heavy mod/max assist for lift off and balance; maintains broad BOS with decreased active use of R LE (limited by pain). Unable to maintain stance >5-6 seconds due to LE weakness.  Lateral scooting edge of bed, mod assist, to assess abilities for scoot pivot transfer to recliner.  Able to complete 2-3 lateral scoots and appropriate for formal transfer; +2 assist unavailable.  Will continue attempts next date with goal of OOB to chair.    Follow Up Recommendations  SNF     Equipment Recommendations  Wheelchair (measurements PT);Wheelchair cushion (measurements PT);Rolling walker with 5" wheels;3in1 (PT);Hospital bed    Recommendations for Other Services       Precautions / Restrictions Precautions Precautions: Fall Restrictions Weight Bearing Restrictions: No    Mobility  Bed Mobility Overal bed mobility: Needs Assistance Bed Mobility: Supine to Sit;Sit to Supine     Supine to sit: Mod assist Sit to supine: Mod assist;Max assist   General bed mobility comments: assist for truncal elevation and  LE elevation over edge of bed  Transfers Overall transfer level: Needs assistance Equipment used: Rolling walker (2 wheeled) Transfers: Sit to/from Stand Sit to Stand: Mod assist;Max assist;From elevated surface         General transfer comment: assist for lift off, standing balance; decreased weight acceptance/stability R LE; unable to maintain >5-6 seconds per trial due to LE weakness  Ambulation/Gait             General Gait Details: unsafe/unable   Stairs             Wheelchair Mobility    Modified Rankin (Stroke Patients Only)       Balance Overall balance assessment: Needs assistance Sitting-balance support: No upper extremity supported;Feet supported Sitting balance-Leahy Scale: Good Sitting balance - Comments: dizziness with initial transition to upright, resolves with accommodation to position; vitals stable and WFL     Standing balance-Leahy Scale: Poor Standing balance comment: heavy mod/max assist to maintain balance; heavy reliance on RW                            Cognition Arousal/Alertness: Awake/alert Behavior During Therapy: WFL for tasks assessed/performed Overall Cognitive Status: Within Functional Limits for tasks assessed                                        Exercises Other Exercises Other Exercises: Sit/stand x3 from elevated bed surface, mod/max assist +1 with RW.  Extensive assist for  lift off; broad BOS with decreased active use of R LE. Heavy use of UEs and RW to complete movement transition.  Unable to maintain adequate duration for full standing BP assessment or to attempt stepping (mod buckling with weight shifting). Other Exercises: Lateral scooting edge of bed, mod assist, to assess abilities for scoot pivot transfer to recliner.  Able to complete 2-3 lateral scoots and appropriate for formal transfer; +2 assist unavailable.  Will continue attempts next date.    General Comments         Pertinent Vitals/Pain Pain Assessment: Faces Faces Pain Scale: Hurts little more Pain Location: L hip, R knee Pain Descriptors / Indicators: Sore Pain Intervention(s): Limited activity within patient's tolerance;Monitored during session;Repositioned    Home Living                      Prior Function            PT Goals (current goals can now be found in the care plan section) Acute Rehab PT Goals Patient Stated Goal: to improve strength and mobility PT Goal Formulation: With patient Time For Goal Achievement: 01/10/20 Potential to Achieve Goals: Fair Progress towards PT goals: Progressing toward goals    Frequency    Min 2X/week      PT Plan Current plan remains appropriate    Co-evaluation              AM-PAC PT "6 Clicks" Mobility   Outcome Measure  Help needed turning from your back to your side while in a flat bed without using bedrails?: A Little Help needed moving from lying on your back to sitting on the side of a flat bed without using bedrails?: A Lot Help needed moving to and from a bed to a chair (including a wheelchair)?: Total Help needed standing up from a chair using your arms (e.g., wheelchair or bedside chair)?: A Lot Help needed to walk in hospital room?: Total Help needed climbing 3-5 steps with a railing? : Total 6 Click Score: 10    End of Session Equipment Utilized During Treatment: Gait belt Activity Tolerance: Patient tolerated treatment well Patient left: in bed;with call bell/phone within reach;with bed alarm set Nurse Communication: Mobility status;Precautions PT Visit Diagnosis: Other abnormalities of gait and mobility (R26.89);Muscle weakness (generalized) (M62.81);History of falling (Z91.81);Difficulty in walking, not elsewhere classified (R26.2)     Time: [id number] PT Time Calculation (min) (ACUTE ONLY): 27 min  Charges:  $Therapeutic Activity: 23-37 mins                     Knoxx Boeding H. Owens Shark, PT, DPT,  NCS 12/30/19, 4:59 PM [phone]

## 2019-12-31 LAB — BASIC METABOLIC PANEL
Anion gap: 7 (ref 5–15)
BUN: 48 mg/dL — ABNORMAL HIGH (ref 8–23)
CO2: 29 mmol/L (ref 22–32)
Calcium: 9.2 mg/dL (ref 8.9–10.3)
Chloride: 102 mmol/L (ref 98–111)
Creatinine, Ser: 1.48 mg/dL — ABNORMAL HIGH (ref 0.61–1.24)
GFR calc Af Amer: 49 mL/min — ABNORMAL LOW (ref >60)
GFR calc non Af Amer: 43 mL/min — ABNORMAL LOW (ref >60)
Glucose, Bld: 127 mg/dL — ABNORMAL HIGH (ref 70–99)
Potassium: 5.5 mmol/L — ABNORMAL HIGH (ref 3.5–5.1)
Sodium: 138 mmol/L (ref 135–145)

## 2019-12-31 LAB — CBC
HCT: 30.4 % — ABNORMAL LOW (ref 39.0–52.0)
Hemoglobin: 9.6 g/dL — ABNORMAL LOW (ref 13.0–17.0)
MCH: 29.5 pg (ref 26.0–34.0)
MCHC: 31.6 g/dL (ref 30.0–36.0)
MCV: 93.5 fL (ref 80.0–100.0)
Platelets: 174 10*3/uL (ref 150–400)
RBC: 3.25 MIL/uL — ABNORMAL LOW (ref 4.22–5.81)
RDW: 13.7 % (ref 11.5–15.5)
WBC: 10.5 10*3/uL (ref 4.0–10.5)
nRBC: 0.5 % — ABNORMAL HIGH (ref 0.0–0.2)

## 2019-12-31 LAB — POTASSIUM
Potassium: 5.8 mmol/L — ABNORMAL HIGH (ref 3.5–5.1)
Potassium: 5.3 mmol/L — ABNORMAL HIGH (ref 3.5–5.1)

## 2019-12-31 LAB — RESPIRATORY PANEL BY RT PCR (FLU A&B, COVID)
Influenza A by PCR: NEGATIVE
Influenza B by PCR: NEGATIVE
SARS Coronavirus 2 by RT PCR: NEGATIVE

## 2019-12-31 LAB — GLUCOSE, CAPILLARY
Glucose-Capillary: 100 mg/dL — ABNORMAL HIGH (ref 70–99)
Glucose-Capillary: 159 mg/dL — ABNORMAL HIGH (ref 70–99)
Glucose-Capillary: 202 mg/dL — ABNORMAL HIGH (ref 70–99)
Glucose-Capillary: 289 mg/dL — ABNORMAL HIGH (ref 70–99)
Glucose-Capillary: 332 mg/dL — ABNORMAL HIGH (ref 70–99)

## 2019-12-31 MED ORDER — FUROSEMIDE 10 MG/ML IJ SOLN
40.0000 mg | Freq: Once | INTRAMUSCULAR | Status: AC
  Administered 2019-12-31: 40 mg via INTRAVENOUS
  Filled 2019-12-31: qty 4

## 2019-12-31 MED ORDER — INSULIN ASPART 100 UNIT/ML ~~LOC~~ SOLN
3.0000 [IU] | Freq: Three times a day (TID) | SUBCUTANEOUS | Status: DC
  Administered 2019-12-31 – 2020-01-01 (×4): 3 [IU] via SUBCUTANEOUS
  Filled 2019-12-31 (×4): qty 1

## 2019-12-31 MED ORDER — ALBUTEROL SULFATE HFA 108 (90 BASE) MCG/ACT IN AERS
1.0000 | INHALATION_SPRAY | RESPIRATORY_TRACT | Status: DC | PRN
  Administered 2020-01-01: 1 via RESPIRATORY_TRACT
  Filled 2019-12-31: qty 6.7

## 2019-12-31 MED ORDER — IPRATROPIUM-ALBUTEROL 20-100 MCG/ACT IN AERS
1.0000 | INHALATION_SPRAY | Freq: Four times a day (QID) | RESPIRATORY_TRACT | Status: DC
  Administered 2019-12-31 – 2020-01-01 (×3): 1 via RESPIRATORY_TRACT
  Filled 2019-12-31: qty 4

## 2019-12-31 MED ORDER — DOXYCYCLINE HYCLATE 100 MG PO TABS: 100.0000 mg | ORAL_TABLET | Freq: Two times a day (BID) | ORAL | 0 refills | Status: AC

## 2019-12-31 MED ORDER — SODIUM ZIRCONIUM CYCLOSILICATE 10 G PO PACK
10.0000 g | PACK | Freq: Once | ORAL | Status: AC
  Administered 2019-12-31: 22:00:00 10 g via ORAL
  Filled 2019-12-31: qty 1

## 2019-12-31 MED ORDER — CALCIUM GLUCONATE-NACL 1-0.675 GM/50ML-% IV SOLN
1.0000 g | Freq: Once | INTRAVENOUS | Status: AC
  Administered 2019-12-31: 1000 mg via INTRAVENOUS
  Filled 2019-12-31: qty 50

## 2019-12-31 MED ORDER — PREDNISONE 20 MG PO TABS: 40.0000 mg | ORAL_TABLET | Freq: Every day | ORAL | 0 refills | Status: AC

## 2019-12-31 MED ORDER — INSULIN ASPART 100 UNIT/ML ~~LOC~~ SOLN
0.0000 [IU] | Freq: Three times a day (TID) | SUBCUTANEOUS | Status: DC
  Administered 2019-12-31: 8 [IU] via SUBCUTANEOUS
  Administered 2019-12-31: 5 [IU] via SUBCUTANEOUS
  Administered 2020-01-01 (×2): 8 [IU] via SUBCUTANEOUS
  Filled 2019-12-31 (×4): qty 1

## 2019-12-31 MED ORDER — INSULIN ASPART 100 UNIT/ML ~~LOC~~ SOLN
0.0000 [IU] | Freq: Every day | SUBCUTANEOUS | Status: DC
  Administered 2019-12-31: 22:00:00 4 [IU] via SUBCUTANEOUS
  Filled 2019-12-31: qty 1

## 2019-12-31 MED ORDER — SODIUM CHLORIDE 0.9 % IV SOLN: INTRAVENOUS | Status: DC

## 2019-12-31 MED ORDER — SODIUM BICARBONATE 8.4 % IV SOLN
50.0000 meq | Freq: Once | INTRAVENOUS | Status: AC
  Administered 2019-12-31: 15:00:00 50 meq via INTRAVENOUS
  Filled 2019-12-31: qty 50

## 2019-12-31 MED ORDER — TRAMADOL HCL 50 MG PO TABS: 50.0000 mg | ORAL_TABLET | Freq: Four times a day (QID) | ORAL | 0 refills | Status: AC | PRN

## 2019-12-31 NOTE — Progress Notes (Signed)
OT Cancellation Note  Patient Details Name: Charles [name] MRN: [id number] DOB: 08-05-1932   Cancelled Treatment:    Reason Eval/Treat Not Completed: Medical issues which prohibited therapy  Upon chart review at this time, pt's K+ at 5.8 which falls outside therapy guidelines for activity. Will f/u as pt becomes more appropriate for therapy particiption. Thank you.  Gerrianne Scale, [place], OTR/L ascom [phone] 12/31/19, 1:39 PM

## 2019-12-31 NOTE — Care Management Important Message (Signed)
Important Message  Patient Details  Name: Charles [name] MRN: [id number] Date of Birth: April 05, 1932   Medicare Important Message Given:  Yes     Dannette Barbara 12/31/2019, 1:45 PM

## 2019-12-31 NOTE — Progress Notes (Signed)
PT Cancellation Note  Patient Details Name: Charles [name] MRN: [id number] DOB: 04/02/1932   Cancelled Treatment:    Reason Eval/Treat Not Completed: Medical issues which prohibited therapy(Chart reviewed for attempted treatment session. Patient noted with critically elevated potassium (5.5) at recent draw, pending re-check at 1300.  Will hold at this time and re-attempt in PM as medically appropriate.)   Kerrilyn Azbill H. Owens Shark, PT, DPT, NCS 12/31/19, 9:25 AM [phone]

## 2019-12-31 NOTE — TOC Progression Note (Signed)
Transition of Care [facility]) - Progression Note    Patient Details  Name: Charles [name] MRN: [id number] Date of Birth: Sep 13, 1932  Transition of Care [facility]) CM/SW [place], RN Phone Number: 12/31/2019, 3:00 PM  Clinical Narrative:      Discharge Summary faxed to [facility] and sent through [place] to [place] resources.  DC packet placed on chart.  Nurse to call EMS this afternoon/evening after therapy is complete.  No further TOC needs at this time, please re-consult for new needs.    Expected Discharge Plan: Skilled Nursing Facility Barriers to Discharge: Insurance Authorization  Expected Discharge Plan and Services Expected Discharge Plan: [place]   Discharge Planning Services: CM Consult Post Acute Care Choice: [place] Living arrangements for the past 2 months: Single Family Home Expected Discharge Date: 12/31/19                                     Social Determinants of Health (SDOH) Interventions    Readmission Risk Interventions Readmission Risk Prevention Plan 08/14/2019  Transportation Screening Complete  PCP or Specialist Appt within 3-5 Days Complete  HRI or [place] Complete  Social Work Consult for [place] Planning/Counseling Complete  Palliative Care Screening Complete  Medication Review Press photographer) Complete  Some recent data might be hidden

## 2019-12-31 NOTE — Progress Notes (Signed)
PROGRESS NOTE    Charles [name]  [id number] DOB: 20-Mar-1932 DOA: 12/25/2019 PCP: [name], MD       Assessment & Plan:   Active Problems:   Weakness   Troponin level elevated   COPD exacerbation (HCC)   Pressure injury of skin   Atrial fibrillation (HCC)   AKI on CKD stage IIIb: Cr is labile. Will start IVFs. Avoid nephrotoxic meds. Will continue to monitor   Hyperkalemia: continue on IVFs. Repeat K level ordered  COPD exacerbation: continue on IV steroids, doxycycline. Scheduled duonebs. Encourage incentive spirometry   Chronic hypoxic respiratory failure: continue on supplemental oxygen. Management as stated above  Elevated troponins: trending up, likely secondary to demand ischemia vs CKD & diastolic CHF as per cardio  DM2: uncontrolled. Continue on SSI w/ accuchecks  Atrial flutter: continue amiodarone and eliquis. Continue on tele  Dyslipidemia: continue on statin  BPH: continue on proscar   Depression: continue celexa.  Generalized weakness: PT/OT recs SNF   DVT prophylaxis: eliquis Code Status: DNR  Family Communication:  Discussed pt's care w/ pt's daughter in law, [name], & answered her questions. Pt's PCP recently ordered the pt to have hospital bed at home as well as palliative care as per pt's daughter in law, [name]  Disposition Plan: stable for d/c to SNF today but still waiting on insurance auth   Consultants:   cardio  Procedures:   Antimicrobials: doxycycline   Subjective: Pt c/o fatigue.  Objective: Vitals:   12/30/19 1917 12/30/19 2100 12/31/19 0344 12/31/19 0735  BP: (!) 157/89  (!) 170/92   Pulse: 93  91   Resp: 20  17   Temp: 98 F (36.7 C)  97.8 F (36.6 C)   TempSrc:   Oral   SpO2: 98% 97% 95% 95%  Weight:   108.4 kg   Height:        Intake/Output Summary (Last 24 hours) at 12/31/2019 0748 Last data filed at 12/31/2019 0350 Gross per 24 hour  Intake 960 ml  Output 1425 ml  Net -465 ml   Filed  Weights   12/29/19 0157 12/30/19 0445 12/31/19 0344  Weight: 106.9 kg 105.8 kg 108.4 kg    Examination:  General exam: Appears lethargic Respiratory system: decreased breath sounds b/l. No rales Cardiovascular system: S1 & S2+. No rubs, gallops or clicks.  Gastrointestinal system: Abdomen is nondistended, soft and nontender. normal bowel sounds heard. Central nervous system: Alert and awake. Moves all 4 extremities Psychiatry: flat mood and affect     Data Reviewed: I have personally reviewed following labs and imaging studies  CBC: Recent Labs  Lab 12/25/19 1508 12/26/19 0039 12/27/19 0601 12/28/19 0509 12/29/19 0514 12/30/19 0640 12/31/19 0541  WBC 8.1   < > 7.5 7.5 8.2 9.8 10.5  NEUTROABS 6.3  --   --   --   --   --   --   HGB 10.5*   < > 9.8* 9.0* 9.2* 9.6* 9.6*  HCT 32.8*   < > 31.4* 28.7* 28.6* 29.7* 30.4*  MCV 92.4   < > 94.6 94.7 92.3 93.4 93.5  PLT 179   < > 194 179 177 179 174   < > = values in this interval not displayed.   Basic Metabolic Panel: Recent Labs  Lab 12/27/19 0601 12/28/19 0509 12/29/19 0514 12/30/19 0640 12/31/19 0541  NA 143 139 137 139 138  K 4.3 4.0 4.5 4.9 5.5*  CL 104 102 101 105 102  CO2 32  29 28 28 29   GLUCOSE 152* 198* 119* 116* 127*  BUN 45* 45* 48* 47* 48*  CREATININE 1.54* 1.55* 1.44* 1.37* 1.48*  CALCIUM 9.6 9.3 9.3 9.1 9.2   GFR: Estimated Creatinine Clearance: 47.1 mL/min (A) (by C-G formula based on SCr of 1.48 mg/dL (H)). Liver Function Tests: Recent Labs  Lab 12/25/19 1508  AST 21  ALT 13  ALKPHOS 75  BILITOT 1.0  PROT 7.9  ALBUMIN 4.0   No results for input(s): LIPASE, AMYLASE in the last 168 hours. No results for input(s): AMMONIA in the last 168 hours. Coagulation Profile: No results for input(s): INR, PROTIME in the last 168 hours. Cardiac Enzymes: Recent Labs  Lab 12/25/19 1508  CKTOTAL 216   BNP (last 3 results) Recent Labs    01/26/19 0000 10/08/19 1151  PROBNP 1,162* 2,349*    HbA1C: No results for input(s): HGBA1C in the last 72 hours. CBG: Recent Labs  Lab 12/30/19 1228 12/30/19 1652 12/30/19 1916 12/30/19 2328 12/31/19 0433  GLUCAP 273* 265* 319* 169* 100*   Lipid Profile: No results for input(s): CHOL, HDL, LDLCALC, TRIG, CHOLHDL, LDLDIRECT in the last 72 hours. Thyroid Function Tests: No results for input(s): TSH, T4TOTAL, FREET4, T3FREE, THYROIDAB in the last 72 hours. Anemia Panel: No results for input(s): VITAMINB12, FOLATE, FERRITIN, TIBC, IRON, RETICCTPCT in the last 72 hours. Sepsis Labs: No results for input(s): PROCALCITON, LATICACIDVEN in the last 168 hours.  No results found for this or any previous visit (from the past 240 hour(s)).       Radiology Studies: No results found.      Scheduled Meds: . amiodarone  200 mg Oral Daily  . apixaban  2.5 mg Oral BID  . vitamin C  500 mg Oral Daily  . atorvastatin  20 mg Oral Daily  . citalopram  10 mg Oral Daily  . docusate sodium  100 mg Oral BID  . doxycycline  100 mg Oral Q12H  . finasteride  5 mg Oral Daily  . insulin aspart  0-20 Units Subcutaneous Q4H  . insulin glargine  15 Units Subcutaneous q morning - 10a   And  . insulin glargine  15 Units Subcutaneous QHS  . ipratropium-albuterol  3 mL Nebulization Q6H  . magnesium oxide  200 mg Oral Daily  . methylPREDNISolone (SOLU-MEDROL) injection  40 mg Intravenous Q12H  . pantoprazole  40 mg Oral Daily  . polyethylene glycol  17 g Oral Daily  . traZODone  50 mg Oral QHS   Continuous Infusions: . sodium chloride       LOS: 6 days    Time spent: 30 mins    Wyvonnia Dusky, MD [facility] Pager [phone]  If 7PM-7AM, please contact night-coverage [email or web address] 12/31/2019, 7:48 AM

## 2019-12-31 NOTE — Progress Notes (Signed)
Inpatient Diabetes Program Recommendations  AACE/ADA: New Consensus Statement on Inpatient Glycemic Control (2015)  Target Ranges:  Prepandial:   less than 140 mg/dL      Peak postprandial:   less than 180 mg/dL (1-2 hours)      Critically ill patients:  140 - 180 mg/dL   Lab Results  Component Value Date   GLUCAP 159 (H) 12/31/2019   HGBA1C 8.4 (H) 12/26/2019   Diabetes history: DM 2 Outpatient Diabetes medications: Lantus 22 units q AM and Lantus 20 units q PM- Metformin 500 mg bid Current orders for Inpatient glycemic control: Lantus 15 units bid, Novolog resistant q 4 hours Solumedrol 40 mg IV q 8 hours IInpatient Diabetes Program Recommendations:    Please reduce frequency of Novolog correction to tid with meals and HS scale. Also please add Novolog meal coverage 3 units tid with meals (hold if patient eats less than 50%).   Thanks  Adah Perl, RN, BC-ADM Inpatient Diabetes Coordinator Pager [phone] (8a-5p)

## 2019-12-31 NOTE — Progress Notes (Signed)
PT Cancellation Note  Patient Details Name: YADIR KAMAN MRN: [id number] DOB: April 12, 1932   Cancelled Treatment:    Reason Eval/Treat Not Completed: Medical issues which prohibited therapy(Chart reviewed for re-attempt. Patient potassium further elevated, 5.8.  Will continue hold and re-attempt next date as medically appropriate.)   Walid Haig H. Owens Shark, PT, DPT, NCS 12/31/19, 2:26 PM [phone]

## 2019-12-31 NOTE — Discharge Summary (Addendum)
Physician Discharge Summary  Charles [name] [id number] DOB: 10/26/1931 DOA: 12/25/2019  PCP: [name], MD  Admit date: 12/25/2019 Discharge date: 01/01/20  Admitted From: home Disposition:  SNF  Recommendations for Outpatient Follow-up:  1. Follow up with PCP in 1-2 weeks 2. BMP in 1 day    Home Health: no Equipment/Devices: 2L [place]  Discharge Condition: stable CODE STATUS: full Diet recommendation: Heart Healthy / Carb Modified  Brief/Interim Summary: HPI was taken from Dr. Sidney Ace: Charles [name]  is a 86 y.o. Caucasian male with a known history of multiple medical problems that are mentioned below, who presented to the emergency room with a concern of recent worsening of dyspnea as well as wheezing but cough that has been mainly dry without chest pain or palpitations.  He has been having recent diminished appetite.  No fever or chills.  No nausea or vomiting or abdominal pain.  No headache or dizziness or blurred vision.  He had a recent fall 5 days ago with subsequent right knee contusion.  His ambulation has been declining since then.  No dysuria, oliguria or hematuria or flank pain.  On presentation to the emergency room, blood pressure was 138/58 with pulse currently is 98% on 3 L of O2 by nasal cannula and otherwise normal vital signs.  Labs revealed a blood glucose of 327 and a BUN of 68 and creatinine of 2.22 compared to 44 and 1.96 on 12/19/2019.  CK was 216 and high-sensitivity troponin I was 110 and later 116 1043.  CBC showed anemia close to previous levels and urinalysis was unremarkable.  TSH was 3.67 with a free T4 of 1.61 and alcohol level less than 10.  Chest x-ray showed aortic atherosclerosis with mild  bibasal subsegmental atelectasis.  The x-ray showed interval near complete resolution of small joint effusion with no fracture or dislocation and generalized osteoarthritic change that is stable with osteoporotic bones.  EKG showed AV paced rhythm with a rate of  94.  The patient was given 1.5 L of IV normal saline bolus.  He will be admitted to a telemetry bed for further evaluation and management.  Hospital Course from Dr. Lenise Herald 3/24/-12/31/19: Pt was found COPD exacerbation that was treated w/ IV steroids, doxycycline and supplemental oxygen. Pt uses supplemental oxygen chronically. Pt responded relatively well to the treatment. 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 On 12/31/19 pt was found to have hyperkalemia. Pt will be calcium gluconate, bicarb & lasix. Will repeat K at 1700 and if trending down, pt may be d/c and if not, will hold d/c today. PT/OT saw the pt and recommend SNF. For more information, please see previous progress/consults notes.   D/c was held on 3/29 secondary to hyperkalemia which has now resolved.   Discharge Diagnoses:  Active Problems:   Weakness   Troponin level elevated   COPD exacerbation (HCC)   Pressure injury of skin   Atrial fibrillation (HCC)  AKI on CKD stage IIIb: Cr is labile. Avoid nephrotoxic meds. Will continue to monitor   Hyperkalemia: s/p bicarb & lokelma. Resolved   COPD exacerbation: continue on steroids, doxycycline x 5 days. Scheduled duonebs. Encourage incentive spirometry   Chronic hypoxic respiratory failure: continue on supplemental oxygen. Management as stated above  Elevated troponins: trending up, likely secondary to demand ischemia vs CKD & diastolic CHF as per cardio  DM2: uncontrolled. Continue on SSI w/ accuchecks  Atrial flutter: continue amiodarone and eliquis. Continue on tele  Dyslipidemia: continue on statin  BPH: continue on proscar   Depression: continue celexa.  Generalized weakness: PT/OT recs SNF  Discharge Instructions  Discharge Instructions    Diet - low sodium heart healthy   Complete by: As directed    Diet Carb Modified   Complete by: As directed    Discharge instructions   Complete by: As directed     F/u PCP in 1-2 weeks; F/u cardio in 1 week   Increase activity slowly   Complete by: As directed    Increase activity slowly   Complete by: As directed      Allergies as of 01/01/2020      Reactions   Gabapentin Other (See Comments)   Leg pain, weakness   Metoprolol Swelling   Spironolactone Other (See Comments)   Painful gynecomastia   Amlodipine Other (See Comments)   Edema   Other Other (See Comments)   Horse serum - myalgias   Rosiglitazone Maleate Other (See Comments)    Did not help   Tricor [fenofibrate] Other (See Comments)   myalgias      Medication List    TAKE these medications   Accu-Chek Aviva Plus test strip Generic drug: glucose blood Use as instructed to check blood sugar three times a day. Dx code E11.8 & E11.65   acetaminophen 500 MG tablet Commonly known as: TYLENOL Take 1 tablet (500 mg total) by mouth 3 (three) times daily.   albuterol 108 (90 Base) MCG/ACT inhaler Commonly known as: VENTOLIN HFA Inhale 2 puffs into the lungs every 4 (four) hours as needed for wheezing or shortness of breath.   amiodarone 200 MG tablet Commonly known as: PACERONE Take 1 tablet (200 mg total) by mouth daily.   apixaban 2.5 MG Tabs tablet Commonly known as: ELIQUIS Take 1 tablet (2.5 mg total) by mouth 2 (two) times daily.   atorvastatin 20 MG tablet Commonly known as: LIPITOR Take 1 tablet (20 mg total) by mouth daily.   BD Insulin Syringe U/F 30G X 1/2" 0.5 ML Misc Generic drug: Insulin Syringe-Needle U-100 AS DIRECTED.   budesonide-formoterol 160-4.5 MCG/ACT inhaler Commonly known as: Symbicort Inhale 2 puffs into the lungs 2 (two) times daily.   citalopram 20 MG tablet Commonly known as: CELEXA Take 1 tablet (20 mg total) by mouth daily.   doxycycline 100 MG tablet Commonly known as: VIBRA-TABS Take 1 tablet (100 mg total) by mouth every 12 (twelve) hours for 1 day.   finasteride 5 MG tablet Commonly known as: PROSCAR Take 1 tablet (5 mg  total) by mouth daily.   guaiFENesin 600 MG 12 hr tablet Commonly known as: MUCINEX Take 600 mg by mouth 2 (two) times daily as needed for cough or to loosen phlegm.   insulin glargine 100 UNIT/ML injection Commonly known as: Lantus Lantus 22 units in the morning and 20 units at bedtime.   lisinopril 10 MG tablet Commonly known as: ZESTRIL Take 1 tablet (10 mg total) by mouth daily.   Mag-Oxide 200 MG Tabs Generic drug: Magnesium Oxide Take 1 tablet (200 mg total) by mouth daily.   metFORMIN 500 MG tablet Commonly known as: GLUCOPHAGE Take 1 tablet (500 mg total) by mouth 2 (two) times daily with a meal.   metolazone 5 MG tablet Commonly known as: ZAROXOLYN Take 1 tablet (5 mg total) by mouth once a week.   nitroGLYCERIN 0.4 MG SL tablet Commonly known as: NITROSTAT Place 1 tablet (0.4 mg total) under the tongue every 5 (five)  minutes as needed for chest pain.   pantoprazole 40 MG tablet Commonly known as: PROTONIX Take 1 tablet (40 mg total) by mouth daily. Take 1 tablet (20 mg total) by mouth daily.   potassium chloride SA 20 MEQ tablet Commonly known as: KLOR-CON Take 1 tablet (20 mEq total) every Monday, Wednesday, Friday, Saturday, and Sunday. Take an additional 72meq with Metolazone.   predniSONE 20 MG tablet Commonly known as: Deltasone Take 2 tablets (40 mg total) by mouth daily for 2 days.   torsemide 20 MG tablet Commonly known as: DEMADEX Take 3 tablets (60 mg total) by mouth 3 (three) times daily. TEMPORARY INCREASE IN DOSE   traMADol 50 MG tablet Commonly known as: Ultram Take 1 tablet (50 mg total) by mouth every 6 (six) hours as needed for up to 7 days.   traZODone 50 MG tablet Commonly known as: DESYREL Take 1 tablet (50 mg total) by mouth at bedtime.   VITAMIN C PO Take 1 tablet by mouth daily.      Contact information for after-discharge care    Destination    [facility] Preferred [facility] .   Service: Skilled  Nursing Contact information: [place] [phone]             Allergies  Allergen Reactions  . Gabapentin Other (See Comments)    Leg pain, weakness   . Metoprolol Swelling  . Spironolactone Other (See Comments)    Painful gynecomastia  . Amlodipine Other (See Comments)    Edema  . Other Other (See Comments)    Horse serum - myalgias  . Rosiglitazone Maleate Other (See Comments)     Did not help  . Tricor [Fenofibrate] Other (See Comments)    myalgias    Consultations:  cardio   Procedures/Studies: DG Knee 2 Views Right  Result Date: 12/19/2019 CLINICAL DATA:  Right knee pain after fall today EXAM: RIGHT KNEE - 1-2 VIEW COMPARISON:  None. FINDINGS: No fracture or dislocation. Small suprapatellar right knee joint effusion. Moderate tricompartmental right knee osteoarthritis. Surgical clips throughout the medial right knee soft tissues. Prominent vascular calcifications in the posterior soft tissues. IMPRESSION: No fracture or dislocation. Small suprapatellar right knee joint effusion. Moderate tricompartmental right knee osteoarthritis. Electronically Signed   By: Ilona Sorrel M.D.   On: 12/19/2019 16:47   CT Head Wo Contrast  Result Date: 12/25/2019 CLINICAL DATA:  86 year old male with head trauma. EXAM: CT HEAD WITHOUT CONTRAST CT CERVICAL SPINE WITHOUT CONTRAST TECHNIQUE: Multidetector CT imaging of the head and cervical spine was performed following the standard protocol without intravenous contrast. Multiplanar CT image reconstructions of the cervical spine were also generated. COMPARISON:  None. FINDINGS: CT HEAD FINDINGS Brain: There is moderate age-related atrophy and chronic microvascular ischemic changes. Patchy area of white matter hypodensity in the right posterior parietal convexity, likely chronic microvascular ischemic changes. 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 There is no acute intracranial  hemorrhage. No mass effect or midline shift. No extra-axial fluid collection. Vascular: No hyperdense vessel or unexpected calcification. Skull: Normal. Negative for fracture or focal lesion. Sinuses/Orbits: No acute finding. Other: None CT CERVICAL SPINE FINDINGS Alignment: No acute subluxation. Skull base and vertebrae: No acute fracture. Osteopenia. Soft tissues and spinal canal: No prevertebral fluid or swelling. No visible canal hematoma. Disc levels: Multilevel degenerative changes with endplate irregularity and disc space narrowing and bone spurring and osteophyte. Upper chest: Negative. Other: Bilateral carotid  bulb calcified plaques. Partially visualized pacemaker wire. IMPRESSION: 1. No acute intracranial hemorrhage. 2. Age-related atrophy and chronic microvascular ischemic changes. 3. No acute/traumatic cervical spine pathology. Electronically Signed   By: Anner Crete M.D.   On: 12/25/2019 16:15   CT Cervical Spine Wo Contrast  Result Date: 12/25/2019 CLINICAL DATA:  86 year old male with head trauma. EXAM: CT HEAD WITHOUT CONTRAST CT CERVICAL SPINE WITHOUT CONTRAST TECHNIQUE: Multidetector CT imaging of the head and cervical spine was performed following the standard protocol without intravenous contrast. Multiplanar CT image reconstructions of the cervical spine were also generated. COMPARISON:  None. FINDINGS: CT HEAD FINDINGS Brain: There is moderate age-related atrophy and chronic microvascular ischemic changes. Patchy area of white matter hypodensity in the right posterior parietal convexity, likely chronic microvascular ischemic changes. 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 There is no acute intracranial hemorrhage. No mass effect or midline shift. No extra-axial fluid collection. Vascular: No hyperdense vessel or unexpected calcification. Skull: Normal. Negative for fracture or focal lesion. Sinuses/Orbits: No acute finding. Other: None CT CERVICAL SPINE  FINDINGS Alignment: No acute subluxation. Skull base and vertebrae: No acute fracture. Osteopenia. Soft tissues and spinal canal: No prevertebral fluid or swelling. No visible canal hematoma. Disc levels: Multilevel degenerative changes with endplate irregularity and disc space narrowing and bone spurring and osteophyte. Upper chest: Negative. Other: Bilateral carotid bulb calcified plaques. Partially visualized pacemaker wire. IMPRESSION: 1. No acute intracranial hemorrhage. 2. Age-related atrophy and chronic microvascular ischemic changes. 3. No acute/traumatic cervical spine pathology. Electronically Signed   By: Anner Crete M.D.   On: 12/25/2019 16:15   DG Chest Portable 1 View  Result Date: 12/25/2019 CLINICAL DATA:  Fall. EXAM: PORTABLE CHEST 1 VIEW COMPARISON:  September 06, 2019. FINDINGS: Stable cardiomegaly. Status post coronary bypass graft. Left-sided pacemaker is unchanged in position. No pneumothorax is noted. Elevated left hemidiaphragm is noted. Mild bibasilar subsegmental atelectasis is noted. Bony thorax is unremarkable. IMPRESSION: Mild bibasilar subsegmental atelectasis. Aortic Atherosclerosis (ICD10-I70.0). Electronically Signed   By: Marijo Conception M.D.   On: 12/25/2019 16:29   DG Knee Complete 4 Views Right  Result Date: 12/25/2019 CLINICAL DATA:  Pain following fall EXAM: RIGHT KNEE - COMPLETE 4+ VIEW COMPARISON:  December 19, 2019 FINDINGS: Frontal, lateral, and bilateral oblique views were obtained. Bones appear somewhat osteoporotic. There is no evident fracture or dislocation. The small joint effusion noted on recent prior study has nearly completely resolved. There is generalized joint space narrowing with spurring in all compartments, stable. No erosive change. There is extensive arterial vascular calcification in the popliteal, distal superficial femoral, and proximal trifurcation arterial vessels. IMPRESSION: Interval near complete resolution of small joint effusion. No  fracture or dislocation. Generalized osteoarthritic change, stable. Bones osteoporotic. Extensive arterial vascular calcification noted. Electronically Signed   By: Lowella Grip III M.D.   On: 12/25/2019 16:29   CT Renal Stone Study  Result Date: 12/25/2019 CLINICAL DATA:  Status post fall. EXAM: CT ABDOMEN AND PELVIS WITHOUT CONTRAST TECHNIQUE: Multidetector CT imaging of the abdomen and pelvis was performed following the standard protocol without IV contrast. COMPARISON:  August 10, 2019 FINDINGS: Lower chest: Multiple sternal wires are seen. An artificial aortic valve is present. There is marked severity coronary artery calcification. Mild to moderate severity atelectasis and/or infiltrate is seen within the bilateral lung bases. Hepatobiliary: A punctate calcified granuloma is seen within the right lobe of the liver. No gallstones, gallbladder wall thickening, or biliary dilatation. Pancreas: Unremarkable.  No pancreatic ductal dilatation or surrounding inflammatory changes. Spleen: Normal in size without focal abnormality. Adrenals/Urinary Tract: A 1.4 cm low-attenuation right adrenal mass is seen. The left adrenal gland is normal in appearance. Kidneys are normal in size, without renal calculi or hydronephrosis. Multiple small bilateral renal cysts are seen. Bladder is unremarkable. Stomach/Bowel: Stomach is within normal limits. The appendix is not identified. No evidence of bowel wall thickening, distention, or inflammatory changes. Noninflamed diverticula are seen throughout the descending and sigmoid colon. Vascular/Lymphatic: Marked severity aortic calcification. No enlarged abdominal or pelvic lymph nodes. Reproductive: The prostate gland is mildly enlarged. A moderate to marked amount of prostate gland calcification is seen. There is a moderate size left-sided hydrocele which extends superiorly into the left inguinal region. Other: No abdominal wall hernia or abnormality. No abdominopelvic  ascites. Musculoskeletal: A chronic appearing compression fracture deformity is seen at the level of the L1 vertebral body. Marked severity multilevel degenerative changes are noted throughout the lumbar spine. IMPRESSION: 1. Mild to moderate severity bibasilar atelectasis and/or infiltrate. 2. Diverticulosis without evidence of diverticulitis. 3. Moderate sized left-sided hydrocele which extends superiorly into the left inguinal region. 4. Marked severity coronary artery calcification. Aortic Atherosclerosis (ICD10-I70.0). Electronically Signed   By: Virgina Norfolk M.D.   On: 12/25/2019 16:27     Subjective: Pt c/o fatigue    Discharge Exam: Vitals:   01/01/20 0945 01/01/20 1125  BP:  133/78  Pulse:  87  Resp:    Temp:  97.7 F (36.5 C)  SpO2: 93% 94%   Vitals:   01/01/20 0611 01/01/20 0835 01/01/20 0945 01/01/20 1125  BP: (!) 159/89 (!) 151/79  133/78  Pulse: 88 88  87  Resp: 18 18    Temp: 97.7 F (36.5 C) 97.7 F (36.5 C)  97.7 F (36.5 C)  TempSrc: Oral     SpO2: 96% 98% 93% 94%  Weight: 107.4 kg     Height:        General exam: Appears lethargic Respiratory system: decreased breath sounds b/l. No rales Cardiovascular system: S1 & S2+. No rubs, gallops or clicks.  Gastrointestinal system: Abdomen is nondistended, soft and nontender. normal bowel sounds heard. Central nervous system: Alert and awake. Moves all 4 extremities Psychiatry: flat mood and affect    The results of significant diagnostics from this hospitalization (including imaging, microbiology, ancillary and laboratory) are listed below for reference.     Microbiology: Recent Results (from the past 240 hour(s))  Respiratory Panel by RT PCR (Flu A&B, Covid) - Nasopharyngeal Swab     Status: None   Collection Time: 12/31/19 10:41 AM   Specimen: Nasopharyngeal Swab  Result Value Ref Range Status   SARS Coronavirus 2 by RT PCR NEGATIVE NEGATIVE Final    Comment: (NOTE) SARS-CoV-2 target nucleic acids  are NOT DETECTED. The SARS-CoV-2 RNA is generally detectable in upper respiratoy specimens during the acute phase of infection. The lowest concentration of SARS-CoV-2 viral copies this assay can detect is 131 copies/mL. A negative result does not preclude SARS-Cov-2 infection and should not be used as the sole basis for treatment or other patient management decisions. A negative result may occur with  improper specimen collection/handling, submission of specimen other than nasopharyngeal swab, presence of viral mutation(s) within the areas targeted by this assay, and inadequate number of viral copies (<131 copies/mL). A negative result must be combined with clinical observations, patient history, and epidemiological information. The expected result is Negative. Fact Sheet for Patients:  PinkCheek.be Fact  Sheet for Healthcare Providers:  GravelBags.it This test is not yet ap proved or cleared by the [place] FDA and  has been authorized for detection and/or diagnosis of SARS-CoV-2 by FDA under an Emergency Use Authorization (EUA). This EUA will remain  in effect (meaning this test can be used) for the duration of the COVID-19 declaration under Section 564(b)(1) of the Act, 21 U.S.C. section 360bbb-3(b)(1), unless the authorization is terminated or revoked sooner.    Influenza A by PCR NEGATIVE NEGATIVE Final   Influenza B by PCR NEGATIVE NEGATIVE Final    Comment: (NOTE) The Xpert Xpress SARS-CoV-2/FLU/RSV assay is intended as an aid in  the diagnosis of influenza from Nasopharyngeal swab specimens and  should not be used as a sole basis for treatment. Nasal washings and  aspirates are unacceptable for Xpert Xpress SARS-CoV-2/FLU/RSV  testing. Fact Sheet for Patients: PinkCheek.be Fact Sheet for Healthcare Providers: GravelBags.it This test is not yet approved or  cleared by the [place] FDA and  has been authorized for detection and/or diagnosis of SARS-CoV-2 by  FDA under an Emergency Use Authorization (EUA). This EUA will remain  in effect (meaning this test can be used) for the duration of the  Covid-19 declaration under Section 564(b)(1) of the Act, 21  U.S.C. section 360bbb-3(b)(1), unless the authorization is  terminated or revoked. Performed at [facility], [address] [place], [place] [address]      Labs: BNP (last 3 results) Recent Labs    09/06/19 1513 10/25/19 1619 12/19/19 1611  BNP 413.4* 356.7* [id number]   Basic Metabolic Panel: Recent Labs  Lab 12/28/19 0509 12/28/19 0509 12/29/19 0514 12/29/19 0514 12/30/19 0640 12/30/19 0640 12/31/19 0541 12/31/19 1245 12/31/19 1651 01/01/20 0535 01/01/20 1237  NA 139  --  137  --  139  --  138  --   --  137  --   K 4.0   < > 4.5   < > 4.9   < > 5.5* 5.8* 5.3* 5.3* 5.0  CL 102  --  101  --  105  --  102  --   --  99  --   CO2 29  --  28  --  28  --  29  --   --  30  --   GLUCOSE 198*  --  119*  --  116*  --  127*  --   --  201*  --   BUN 45*  --  48*  --  47*  --  48*  --   --  52*  --   CREATININE 1.55*  --  1.44*  --  1.37*  --  1.48*  --   --  1.53*  --   CALCIUM 9.3  --  9.3  --  9.1  --  9.2  --   --  9.3  --    < > = values in this interval not displayed.   Liver Function Tests: Recent Labs  Lab 12/25/19 1508  AST 21  ALT 13  ALKPHOS 75  BILITOT 1.0  PROT 7.9  ALBUMIN 4.0   No results for input(s): LIPASE, AMYLASE in the last 168 hours. No results for input(s): AMMONIA in the last 168 hours. CBC: Recent Labs  Lab 12/25/19 1508 12/26/19 0039 12/28/19 0509 12/29/19 0514 12/30/19 0640 12/31/19 0541 01/01/20 0535  WBC 8.1   < > 7.5 8.2 9.8 10.5 10.3  NEUTROABS 6.3  --   --   --   --   --   --  HGB 10.5*   < > 9.0* 9.2* 9.6* 9.6* 10.1*  HCT 32.8*   < > 28.7* 28.6* 29.7* 30.4* 32.1*  MCV 92.4   < > 94.7 92.3 93.4 93.5 93.9  PLT 179   <  > 179 177 179 174 185   < > = values in this interval not displayed.   Cardiac Enzymes: Recent Labs  Lab 12/25/19 1508  CKTOTAL 216   BNP: Invalid input(s): POCBNP CBG: Recent Labs  Lab 12/31/19 1208 12/31/19 1701 12/31/19 2121 01/01/20 0832 01/01/20 1127  GLUCAP 202* 289* 332* 243* 298*   D-Dimer No results for input(s): DDIMER in the last 72 hours. Hgb A1c No results for input(s): HGBA1C in the last 72 hours. Lipid Profile No results for input(s): CHOL, HDL, LDLCALC, TRIG, CHOLHDL, LDLDIRECT in the last 72 hours. Thyroid function studies No results for input(s): TSH, T4TOTAL, T3FREE, THYROIDAB in the last 72 hours.  Invalid input(s): FREET3 Anemia work up No results for input(s): VITAMINB12, FOLATE, FERRITIN, TIBC, IRON, RETICCTPCT in the last 72 hours. Urinalysis    Component Value Date/Time   COLORURINE YELLOW (A) 12/25/2019 1617   APPEARANCEUR CLEAR (A) 12/25/2019 1617   LABSPEC 1.009 12/25/2019 1617   PHURINE 5.0 12/25/2019 1617   GLUCOSEU 50 (A) 12/25/2019 1617   HGBUR NEGATIVE 12/25/2019 1617   BILIRUBINUR NEGATIVE 12/25/2019 1617   BILIRUBINUR negative 02/15/2018 1601   KETONESUR NEGATIVE 12/25/2019 1617   PROTEINUR NEGATIVE 12/25/2019 1617   UROBILINOGEN 0.2 02/15/2018 1601   NITRITE NEGATIVE 12/25/2019 1617   LEUKOCYTESUR TRACE (A) 12/25/2019 1617   Sepsis Labs Invalid input(s): PROCALCITONIN,  WBC,  LACTICIDVEN Microbiology Recent Results (from the past 240 hour(s))  Respiratory Panel by RT PCR (Flu A&B, Covid) - Nasopharyngeal Swab     Status: None   Collection Time: 12/31/19 10:41 AM   Specimen: Nasopharyngeal Swab  Result Value Ref Range Status   SARS Coronavirus 2 by RT PCR NEGATIVE NEGATIVE Final    Comment: (NOTE) SARS-CoV-2 target nucleic acids are NOT DETECTED. The SARS-CoV-2 RNA is generally detectable in upper respiratoy specimens during the acute phase of infection. The lowest concentration of SARS-CoV-2 viral copies this assay can  detect is 131 copies/mL. A negative result does not preclude SARS-Cov-2 infection and should not be used as the sole basis for treatment or other patient management decisions. A negative result may occur with  improper specimen collection/handling, submission of specimen other than nasopharyngeal swab, presence of viral mutation(s) within the areas targeted by this assay, and inadequate number of viral copies (<131 copies/mL). A negative result must be combined with clinical observations, patient history, and epidemiological information. The expected result is Negative. Fact Sheet for Patients:  PinkCheek.be Fact Sheet for Healthcare Providers:  GravelBags.it This test is not yet ap proved or cleared by the [place] FDA and  has been authorized for detection and/or diagnosis of SARS-CoV-2 by FDA under an Emergency Use Authorization (EUA). This EUA will remain  in effect (meaning this test can be used) for the duration of the COVID-19 declaration under Section 564(b)(1) of the Act, 21 U.S.C. section 360bbb-3(b)(1), unless the authorization is terminated or revoked sooner.    Influenza A by PCR NEGATIVE NEGATIVE Final   Influenza B by PCR NEGATIVE NEGATIVE Final    Comment: (NOTE) The Xpert Xpress SARS-CoV-2/FLU/RSV assay is intended as an aid in  the diagnosis of influenza from Nasopharyngeal swab specimens and  should not be used as a sole basis for treatment. Nasal washings and  aspirates are unacceptable for Xpert Xpress SARS-CoV-2/FLU/RSV  testing. Fact Sheet for Patients: PinkCheek.be Fact Sheet for Healthcare Providers: GravelBags.it This test is not yet approved or cleared by the [place] FDA and  has been authorized for detection and/or diagnosis of SARS-CoV-2 by  FDA under an Emergency Use Authorization (EUA). This EUA will remain  in effect (meaning  this test can be used) for the duration of the  Covid-19 declaration under Section 564(b)(1) of the Act, 21  U.S.C. section 360bbb-3(b)(1), unless the authorization is  terminated or revoked. Performed at [facility], [address], [place], [place] [address]      Time coordinating discharge: Over 30 minutes  SIGNED:   Wyvonnia Dusky, MD  [facility] 01/01/2020, 2:04 PM Pager   If 7PM-7AM, please contact night-coverage [email or web address]

## 2019-12-31 NOTE — Progress Notes (Signed)
Patients potassium went from 5.5 to 5.8 after NS started at 75 ml/hr.  MD ordered Calcium gluconate, Bicarb IV push and round of lasix.     Gave IV push of bicarb over 6 mins. In right hand IV.  No complications during administration.   IV flushed well before and after push.  Potassium to be recheck prior to being discharged.

## 2019-12-31 NOTE — TOC Progression Note (Signed)
Transition of Care [facility]) - Progression Note    Patient Details  Name: Charles [name] MRN: [id number] Date of Birth: 10/27/1931  [facility]) CM/SW Contact  Eileen Stanford, LCSW Phone Number: 12/31/2019, 10:38 AM  Clinical Narrative:  Call made to [facility], rapid Covid test ordered.     Expected Discharge Plan: Skilled Nursing Facility Barriers to Discharge: Insurance Authorization  Expected Discharge Plan and Services Expected Discharge Plan: [place]   Discharge Planning Services: CM Consult Post Acute Care Choice: [place] Living arrangements for the past 2 months: Single Family Home                                       Social Determinants of Health (SDOH) Interventions    Readmission Risk Interventions Readmission Risk Prevention Plan 08/14/2019  Transportation Screening Complete  PCP or Specialist Appt within 3-5 Days Complete  HRI or [place] Complete  Social Work Consult for [place] Planning/Counseling Complete  Palliative Care Screening Complete  Medication Review Press photographer) Complete  Some recent data might be hidden

## 2020-01-01 DIAGNOSIS — E875 Hyperkalemia: Secondary | ICD-10-CM | POA: Diagnosis not present

## 2020-01-01 DIAGNOSIS — R319 Hematuria, unspecified: Secondary | ICD-10-CM | POA: Diagnosis not present

## 2020-01-01 DIAGNOSIS — R0602 Shortness of breath: Secondary | ICD-10-CM | POA: Diagnosis not present

## 2020-01-01 DIAGNOSIS — M6281 Muscle weakness (generalized): Secondary | ICD-10-CM | POA: Diagnosis not present

## 2020-01-01 DIAGNOSIS — I4891 Unspecified atrial fibrillation: Secondary | ICD-10-CM | POA: Diagnosis not present

## 2020-01-01 DIAGNOSIS — J449 Chronic obstructive pulmonary disease, unspecified: Secondary | ICD-10-CM | POA: Diagnosis not present

## 2020-01-01 DIAGNOSIS — D649 Anemia, unspecified: Secondary | ICD-10-CM | POA: Diagnosis not present

## 2020-01-01 DIAGNOSIS — R262 Difficulty in walking, not elsewhere classified: Secondary | ICD-10-CM | POA: Diagnosis not present

## 2020-01-01 DIAGNOSIS — Z952 Presence of prosthetic heart valve: Secondary | ICD-10-CM | POA: Diagnosis not present

## 2020-01-01 DIAGNOSIS — I10 Essential (primary) hypertension: Secondary | ICD-10-CM | POA: Diagnosis not present

## 2020-01-01 DIAGNOSIS — R488 Other symbolic dysfunctions: Secondary | ICD-10-CM | POA: Diagnosis not present

## 2020-01-01 DIAGNOSIS — N390 Urinary tract infection, site not specified: Secondary | ICD-10-CM | POA: Diagnosis not present

## 2020-01-01 DIAGNOSIS — R498 Other voice and resonance disorders: Secondary | ICD-10-CM | POA: Diagnosis not present

## 2020-01-01 DIAGNOSIS — Z79899 Other long term (current) drug therapy: Secondary | ICD-10-CM | POA: Diagnosis not present

## 2020-01-01 DIAGNOSIS — R531 Weakness: Secondary | ICD-10-CM | POA: Diagnosis not present

## 2020-01-01 DIAGNOSIS — R5381 Other malaise: Secondary | ICD-10-CM | POA: Diagnosis not present

## 2020-01-01 DIAGNOSIS — Z950 Presence of cardiac pacemaker: Secondary | ICD-10-CM | POA: Diagnosis not present

## 2020-01-01 DIAGNOSIS — I4892 Unspecified atrial flutter: Secondary | ICD-10-CM | POA: Diagnosis not present

## 2020-01-01 DIAGNOSIS — I5033 Acute on chronic diastolic (congestive) heart failure: Secondary | ICD-10-CM | POA: Diagnosis not present

## 2020-01-01 DIAGNOSIS — R279 Unspecified lack of coordination: Secondary | ICD-10-CM | POA: Diagnosis not present

## 2020-01-01 DIAGNOSIS — R300 Dysuria: Secondary | ICD-10-CM | POA: Diagnosis not present

## 2020-01-01 DIAGNOSIS — N3000 Acute cystitis without hematuria: Secondary | ICD-10-CM | POA: Diagnosis not present

## 2020-01-01 DIAGNOSIS — K2101 Gastro-esophageal reflux disease with esophagitis, with bleeding: Secondary | ICD-10-CM | POA: Diagnosis not present

## 2020-01-01 DIAGNOSIS — E785 Hyperlipidemia, unspecified: Secondary | ICD-10-CM | POA: Diagnosis not present

## 2020-01-01 DIAGNOSIS — E119 Type 2 diabetes mellitus without complications: Secondary | ICD-10-CM | POA: Diagnosis not present

## 2020-01-01 DIAGNOSIS — R102 Pelvic and perineal pain: Secondary | ICD-10-CM | POA: Diagnosis not present

## 2020-01-01 DIAGNOSIS — E7800 Pure hypercholesterolemia, unspecified: Secondary | ICD-10-CM | POA: Diagnosis not present

## 2020-01-01 DIAGNOSIS — R6889 Other general symptoms and signs: Secondary | ICD-10-CM | POA: Diagnosis not present

## 2020-01-01 DIAGNOSIS — E1165 Type 2 diabetes mellitus with hyperglycemia: Secondary | ICD-10-CM | POA: Diagnosis not present

## 2020-01-01 DIAGNOSIS — R350 Frequency of micturition: Secondary | ICD-10-CM | POA: Diagnosis not present

## 2020-01-01 DIAGNOSIS — J441 Chronic obstructive pulmonary disease with (acute) exacerbation: Secondary | ICD-10-CM | POA: Diagnosis not present

## 2020-01-01 LAB — GLUCOSE, CAPILLARY
Glucose-Capillary: 243 mg/dL — ABNORMAL HIGH (ref 70–99)
Glucose-Capillary: 298 mg/dL — ABNORMAL HIGH (ref 70–99)

## 2020-01-01 LAB — BASIC METABOLIC PANEL
Anion gap: 8 (ref 5–15)
BUN: 52 mg/dL — ABNORMAL HIGH (ref 8–23)
CO2: 30 mmol/L (ref 22–32)
Calcium: 9.3 mg/dL (ref 8.9–10.3)
Chloride: 99 mmol/L (ref 98–111)
Creatinine, Ser: 1.53 mg/dL — ABNORMAL HIGH (ref 0.61–1.24)
GFR calc Af Amer: 47 mL/min — ABNORMAL LOW (ref >60)
GFR calc non Af Amer: 41 mL/min — ABNORMAL LOW (ref >60)
Glucose, Bld: 201 mg/dL — ABNORMAL HIGH (ref 70–99)
Potassium: 5.3 mmol/L — ABNORMAL HIGH (ref 3.5–5.1)
Sodium: 137 mmol/L (ref 135–145)

## 2020-01-01 LAB — CBC
HCT: 32.1 % — ABNORMAL LOW (ref 39.0–52.0)
Hemoglobin: 10.1 g/dL — ABNORMAL LOW (ref 13.0–17.0)
MCH: 29.5 pg (ref 26.0–34.0)
MCHC: 31.5 g/dL (ref 30.0–36.0)
MCV: 93.9 fL (ref 80.0–100.0)
Platelets: 185 10*3/uL (ref 150–400)
RBC: 3.42 MIL/uL — ABNORMAL LOW (ref 4.22–5.81)
RDW: 14 % (ref 11.5–15.5)
WBC: 10.3 10*3/uL (ref 4.0–10.5)
nRBC: 0.4 % — ABNORMAL HIGH (ref 0.0–0.2)

## 2020-01-01 LAB — POTASSIUM: Potassium: 5 mmol/L (ref 3.5–5.1)

## 2020-01-01 MED ORDER — SODIUM BICARBONATE 8.4 % IV SOLN
100.0000 meq | Freq: Once | INTRAVENOUS | Status: AC
  Administered 2020-01-01: 100 meq via INTRAVENOUS
  Filled 2020-01-01: qty 50

## 2020-01-01 MED ORDER — ALBUTEROL SULFATE HFA 108 (90 BASE) MCG/ACT IN AERS: 2.0000 | INHALATION_SPRAY | RESPIRATORY_TRACT | 0 refills | Status: AC | PRN

## 2020-01-01 MED ORDER — SODIUM ZIRCONIUM CYCLOSILICATE 10 G PO PACK
10.0000 g | PACK | Freq: Once | ORAL | Status: AC
  Administered 2020-01-01: 09:00:00 10 g via ORAL
  Filled 2020-01-01: qty 1

## 2020-01-01 NOTE — Progress Notes (Signed)
Inpatient Diabetes Program Recommendations  AACE/ADA: New Consensus Statement on Inpatient Glycemic Control (2015)  Target Ranges:  Prepandial:   less than 140 mg/dL      [facility] postprandial:   less than 180 mg/dL (1-2 hours)      Critically ill patients:  140 - 180 mg/dL   Lab Results  Component Value Date   GLUCAP 243 (H) 01/01/2020   HGBA1C 8.4 (H) 12/26/2019    Review of Glycemic Control Results for Charles [name], Charles [name] (MRN [id number]) as of 01/01/2020 10:29  Ref. Range 12/31/2019 08:18 12/31/2019 12:08 12/31/2019 17:01 12/31/2019 21:21 01/01/2020 08:32  Glucose-Capillary Latest Ref Range: 70 - 99 mg/dL 159 (H) 202 (H) 289 (H) 332 (H) 243 (H)  Diabetes history:DM 2 Outpatient Diabetes medications: Lantus 22 units q AM and Lantus 20 units q PM- Metformin 500 mg bid Current orders for Inpatient glycemic control: Lantus 15 units bid, Novolog moderate tid with meals and HS, Novolog 3 units tid with meals (hold if patient eats less than 50%).  Solumedrol 40 mg IV q 12 hours Inpatient Diabetes Program Recommendations:    Please consider increasing Lantus to 18 units bid.   Thanks  Adah Perl, RN, BC-ADM Inpatient Diabetes Coordinator Pager [phone] (8a-5p)

## 2020-01-01 NOTE — TOC Transition Note (Signed)
Transition of Care [facility]) - CM/SW Discharge Note   Patient Details  Name: Charles [name] MRN: [id number] Date of Birth: 08/20/1932  Transition of Care [facility]) CM/SW Contact:  Eileen Stanford, LCSW Phone Number: 01/01/2020, 1:46 PM   Clinical Narrative:   Clinical Social Worker facilitated patient discharge including contacting patient family and facility to confirm patient discharge plans.  Clinical information faxed to facility and family agreeable with plan.  CSW arranged ambulance transport via [facility] to [facility]. RN to call [phone] for report.    Final next level of care: Skilled Nursing Facility Barriers to Discharge: No Barriers Identified   Patient Goals and CMS Choice   CMS Medicare.gov Compare Post Acute Care list provided to:: Patient Choice offered to / list presented to : Patient  Discharge Placement              Patient chooses bed at: [facility] [place] Patient to be transferred to facility by: [facility] Name of family member notified: [name] Patient and family notified of of transfer: 01/01/20  Discharge Plan and Services   Discharge Planning Services: CM Consult Post Acute Care Choice: [place]                               Social Determinants of Health ([place]) Interventions     Readmission Risk Interventions Readmission Risk Prevention Plan 08/14/2019  Transportation Screening Complete  PCP or Specialist Appt within 3-5 Days Complete  HRI or [place] Complete  Social Work Consult for [place] Planning/Counseling Complete  Palliative Care Screening Complete  Medication Review Press photographer) Complete  Some recent data might be hidden

## 2020-01-01 NOTE — TOC Progression Note (Addendum)
Transition of Care [facility]) - Progression Note    Patient Details  Name: Charles [name] MRN: [id number] Date of Birth: 1932-02-26  Transition of Care [facility]) CM/SW Contact  Eileen Stanford, LCSW Phone Number: 01/01/2020, 9:55 AM  Clinical Narrative:   Josem Kaufmann has expired, new authorization submitted.  CSW received a call from [facility] and they state authorization has been extended. Pt is authorized to admit to SNF today. Facility notified.    Expected Discharge Plan: Skilled Nursing Facility Barriers to Discharge: Insurance Authorization  Expected Discharge Plan and Services Expected Discharge Plan: [place]   Discharge Planning Services: CM Consult Post Acute Care Choice: [place] Living arrangements for the past 2 months: Single Family Home Expected Discharge Date: 12/31/19                                     Social Determinants of Health (SDOH) Interventions    Readmission Risk Interventions Readmission Risk Prevention Plan 08/14/2019  Transportation Screening Complete  PCP or Specialist Appt within 3-5 Days Complete  HRI or [place] Complete  Social Work Consult for [place] Planning/Counseling Complete  Palliative Care Screening Complete  Medication Review Press photographer) Complete  Some recent data might be hidden

## 2020-01-01 NOTE — Progress Notes (Addendum)
Physical Therapy Treatment Patient Details Name: Charles [name] MRN: [id number] DOB: 04-10-1932 Today's Date: 01/01/2020    History of Present Illness Charles [name] is an 86yo male presenting to hospital 12/25/19 with increasing weakness; per chart pt has been declining since Covid in December and continued to decline after recent fall.  Pt admitted with AKI, COPD exacerbation, and elevated troponin.  Per chart pt seen in ED 3/17 after a fall and sustained R knee contusion.  PMH includes CAD s/p CABG, CHF, CKD, s/p TAVR, L1 compression fx, COVID-19 (+) 09/06/19, pacemaker, a-flutter.    PT Comments    Pt in bed upon arrival, asleep but easily awakened, agreeable to participate. Per chart review, most recent K+: 5.3, still elevated but just within the cutoff for working with PT- this discussed with attending who is supportive. Pt on 2L/[place] upon arrival, trialed on room air for all session, noted SpO2 92-93% on room air. MaxA for supien to sitting EOB, min-modA for STS transfers with heavy UE dependence, and min-modA for steps for pivot transfer. Pt agreeable to attempt AMB with chair follow, but only makes it 60ft due to severe leg weakness/fatiguability. Pt assisted to [facility] at end of session due to bowel urgency. RN in room at exit to assist patient. Pt remains significantly deconditioned and weak, heavy physical assist needed for basic ADL performance. Pt not safe to be ambulatory in a home setting right now.     Follow Up Recommendations  SNF     Equipment Recommendations  Wheelchair (measurements PT);Wheelchair cushion (measurements PT);Rolling walker with 5" wheels;3in1 (PT);Hospital bed    Recommendations for Other Services       Precautions / Restrictions Precautions Precautions: Fall Precaution Comments: O2 PTA last several months Restrictions Weight Bearing Restrictions: No    Mobility  Bed Mobility Overal bed mobility: Needs Assistance Bed Mobility: Supine to Sit;Sit to  Supine     Supine to sit: Mod assist     General bed mobility comments: maxA of legs, modA of trunk with BUE self-pull to flexion  Transfers Overall transfer level: Needs assistance Equipment used: Rolling walker (2 wheeled) Transfers: Sit to/from [facility] Sit to Stand: Min assist;Mod assist Stand pivot transfers: Min assist;Mod assist       General transfer comment: minA inititallty, but fatigue creates limitation after 2x, needs mod-maxA thereafter  Ambulation/Gait Ambulation/Gait assistance: Min guard Gait Distance (Feet): 6 Feet(with chair follow in room, legs very weak/fatiguable) Assistive device: Rolling walker (2 wheeled) Gait Pattern/deviations: Step-to pattern;Antalgic;Trunk flexed     General Gait Details: chronic trunk flexion 2/2 knee OA/low back issues   Stairs             Wheelchair Mobility    Modified Rankin (Stroke Patients Only)       Balance Overall balance assessment: Needs assistance Sitting-balance support: No upper extremity supported;Feet supported Sitting balance-Leahy Scale: Good Sitting balance - Comments: dizziness with initial transition to upright, resolves with accommodation to position; vitals stable and WFL   Standing balance support: Bilateral upper extremity supported Standing balance-Leahy Scale: Fair Standing balance comment: heavy mod/max assist to maintain balance; heavy reliance on RW                            Cognition Arousal/Alertness: Awake/alert Behavior During Therapy: WFL for tasks assessed/performed Overall Cognitive Status: Within Functional Limits for tasks assessed  Exercises      General Comments        Pertinent Vitals/Pain Pain Assessment: No/denies pain Pain Intervention(s): Monitored during session    Home Living                      Prior Function            PT Goals (current goals can  now be found in the care plan section) Acute Rehab PT Goals Patient Stated Goal: to improve strength and mobility PT Goal Formulation: With patient Time For Goal Achievement: 01/10/20 Potential to Achieve Goals: Fair Progress towards PT goals: Progressing toward goals    Frequency    Min 2X/week      PT Plan Current plan remains appropriate    Co-evaluation              AM-PAC PT "6 Clicks" Mobility   Outcome Measure  Help needed turning from your back to your side while in a flat bed without using bedrails?: A Lot Help needed moving from lying on your back to sitting on the side of a flat bed without using bedrails?: A Lot Help needed moving to and from a bed to a chair (including a wheelchair)?: A Lot Help needed standing up from a chair using your arms (e.g., wheelchair or bedside chair)?: A Lot Help needed to walk in hospital room?: Total Help needed climbing 3-5 steps with a railing? : Total 6 Click Score: 10    End of Session Equipment Utilized During Treatment: Gait belt;Oxygen Activity Tolerance: Patient tolerated treatment well;No increased pain;Patient limited by fatigue Patient left: with call bell/phone within reach;with nursing/sitter in room;Other (comment)(on BSC per pt request) Nurse Communication: Mobility status;Precautions PT Visit Diagnosis: Other abnormalities of gait and mobility (R26.89);Muscle weakness (generalized) (M62.81);History of falling (Z91.81);Difficulty in walking, not elsewhere classified (R26.2)     Time: [id number] PT Time Calculation (min) (ACUTE ONLY): 29 min  Charges:  $Therapeutic Exercise: 23-37 mins                     10:09 AM, 01/01/20 Etta Grandchild, PT, DPT Physical Therapist - [facility]  [phone] ([place])    Mamadou Breon C 01/01/2020, 10:06 AM

## 2020-01-04 DIAGNOSIS — E119 Type 2 diabetes mellitus without complications: Secondary | ICD-10-CM | POA: Diagnosis not present

## 2020-01-04 DIAGNOSIS — R0602 Shortness of breath: Secondary | ICD-10-CM | POA: Diagnosis not present

## 2020-01-04 DIAGNOSIS — J449 Chronic obstructive pulmonary disease, unspecified: Secondary | ICD-10-CM | POA: Diagnosis not present

## 2020-01-04 DIAGNOSIS — I10 Essential (primary) hypertension: Secondary | ICD-10-CM | POA: Diagnosis not present

## 2020-01-04 DIAGNOSIS — I4891 Unspecified atrial fibrillation: Secondary | ICD-10-CM | POA: Diagnosis not present

## 2020-01-10 DIAGNOSIS — J449 Chronic obstructive pulmonary disease, unspecified: Secondary | ICD-10-CM | POA: Diagnosis not present

## 2020-01-10 DIAGNOSIS — R0602 Shortness of breath: Secondary | ICD-10-CM | POA: Diagnosis not present

## 2020-01-10 DIAGNOSIS — I4891 Unspecified atrial fibrillation: Secondary | ICD-10-CM | POA: Diagnosis not present

## 2020-01-10 DIAGNOSIS — I10 Essential (primary) hypertension: Secondary | ICD-10-CM | POA: Diagnosis not present

## 2020-01-10 DIAGNOSIS — E119 Type 2 diabetes mellitus without complications: Secondary | ICD-10-CM | POA: Diagnosis not present

## 2020-01-14 ENCOUNTER — Telehealth: Payer: Self-pay

## 2020-01-14 ENCOUNTER — Telehealth: Payer: Medicare Other

## 2020-01-14 DIAGNOSIS — I4891 Unspecified atrial fibrillation: Secondary | ICD-10-CM

## 2020-01-14 DIAGNOSIS — I10 Essential (primary) hypertension: Secondary | ICD-10-CM

## 2020-01-14 NOTE — Chronic Care Management (AMB) (Deleted)
Chronic Care Management Pharmacy  Name: Charles [name]  MRN: [id number] DOB: 05-14-1932  Chief Complaint/ HPI  [name],  86 y.o. , male presents for their Initial CCM visit with the clinical pharmacist via telephone.  PCP : [name], MD  Their chronic conditions include:   Patient concerns:  Recent hospitalization: hospitalized 12/25/19 COPD exacerbation/AKI; discharged to rehab 01/01/20, last note 01/10/20 - had UTI, on [place] Visits: 12/24/19: [name] -   Consult Visit:  Allergies  Allergen Reactions  . Gabapentin Other (See Comments)    Leg pain, weakness   . Metoprolol Swelling  . Spironolactone Other (See Comments)    Painful gynecomastia  . Amlodipine Other (See Comments)    Edema  . Other Other (See Comments)    Horse serum - myalgias  . Rosiglitazone Maleate Other (See Comments)     Did not help  . Tricor [Fenofibrate] Other (See Comments)    myalgias   Medications: Outpatient Encounter Medications as of 01/14/2020  Medication Sig  . acetaminophen (TYLENOL) 500 MG tablet Take 1 tablet (500 mg total) by mouth 3 (three) times daily.  [name] albuterol (VENTOLIN HFA) 108 (90 Base) MCG/ACT inhaler Inhale 2 puffs into the lungs every 4 (four) hours as needed for wheezing or shortness of breath.  [name] amiodarone (PACERONE) 200 MG tablet Take 1 tablet (200 mg total) by mouth daily.  [name] apixaban (ELIQUIS) 2.5 MG TABS tablet Take 1 tablet (2.5 mg total) by mouth 2 (two) times daily.  . Ascorbic Acid (VITAMIN C PO) Take 1 tablet by mouth daily.  [name] atorvastatin (LIPITOR) 20 MG tablet Take 1 tablet (20 mg total) by mouth daily.  . BD INSULIN SYRINGE U/F 30G X 1/2" 0.5 ML MISC AS DIRECTED.  [name] budesonide-formoterol (SYMBICORT) 160-4.5 MCG/ACT inhaler Inhale 2 puffs into the lungs 2 (two) times daily.  . citalopram (CELEXA) 20 MG tablet Take 1 tablet (20 mg total) by mouth daily.  . finasteride (PROSCAR) 5 MG tablet Take 1 tablet (5 mg total) by mouth  daily.  [name] glucose blood (ACCU-CHEK AVIVA PLUS) test strip Use as instructed to check blood sugar three times a day. Dx code E11.8 & E11.65  . guaiFENesin (MUCINEX) 600 MG 12 hr tablet Take 600 mg by mouth 2 (two) times daily as needed for cough or to loosen phlegm.  . insulin glargine (LANTUS) 100 UNIT/ML injection Lantus 22 units in the morning and 20 units at bedtime.  [name] lisinopril (ZESTRIL) 10 MG tablet Take 1 tablet (10 mg total) by mouth daily.  . Magnesium Oxide (MAG-OXIDE) 200 MG TABS Take 1 tablet (200 mg total) by mouth daily.  . metFORMIN (GLUCOPHAGE) 500 MG tablet Take 1 tablet (500 mg total) by mouth 2 (two) times daily with a meal.  . metolazone (ZAROXOLYN) 5 MG tablet Take 1 tablet (5 mg total) by mouth once a week.  . nitroGLYCERIN (NITROSTAT) 0.4 MG SL tablet Place 1 tablet (0.4 mg total) under the tongue every 5 (five) minutes as needed for chest pain.  . pantoprazole (PROTONIX) 40 MG tablet Take 1 tablet (40 mg total) by mouth daily. Take 1 tablet (20 mg total) by mouth daily.  . potassium chloride SA (KLOR-CON) 20 MEQ tablet Take 1 tablet (20 mEq total) every Monday, Wednesday, Friday, Saturday, and Sunday. Take an additional 29meq with Metolazone.  . torsemide (DEMADEX) 20 MG tablet Take 3 tablets (60 mg total) by mouth 3 (three) times daily. TEMPORARY INCREASE IN DOSE  .  traZODone (DESYREL) 50 MG tablet Take 1 tablet (50 mg total) by mouth at bedtime.   No facility-administered encounter medications on file as of 01/14/2020.   Current Diagnosis/Assessment:  {CHL HP [facility] Diagnosis/[phone]}   Vaccines   Reviewed and discussed patient's vaccination history.    Immunization History  Administered Date(s) Administered  . Influenza Split 07/04/2012  . Influenza Whole 07/04/2005  . Influenza, High Dose Seasonal PF 08/24/2017  . Influenza,inj,Quad PF,6+ Mos 06/25/2015, 07/26/2018, 08/01/2019  . Influenza,inj,quad, With Preservative 08/02/2018  .  Influenza-Unspecified 08/10/2013, 07/04/2014, 07/15/2016  . Pneumococcal Conjugate-13 04/02/2014  . Pneumococcal Polysaccharide-23 10/05/2002  . Td 07/20/2005   Plan: Recommended patient receive   Medication Management  Misc:   OTCs:  Pharmacy/Benefits:  Adherence:  Social support:  Affordability:  CCM Follow Up:  Debbora Dus, PharmD Clinical Pharmacist [facility] [phone]

## 2020-01-14 NOTE — Telephone Encounter (Signed)
I would like to request a referral for Charles [name] to chronic care management pharmacy services for the following conditions:   Essential hypertension, benign  [I10]  Atrial fibrillation ([place]) [I48.91]  Debbora Dus, PharmD Clinical Pharmacist [facility] at [facility] [phone]

## 2020-01-20 DIAGNOSIS — I10 Essential (primary) hypertension: Secondary | ICD-10-CM | POA: Diagnosis not present

## 2020-01-20 DIAGNOSIS — R0602 Shortness of breath: Secondary | ICD-10-CM | POA: Diagnosis not present

## 2020-01-20 DIAGNOSIS — E119 Type 2 diabetes mellitus without complications: Secondary | ICD-10-CM | POA: Diagnosis not present

## 2020-01-20 DIAGNOSIS — J449 Chronic obstructive pulmonary disease, unspecified: Secondary | ICD-10-CM | POA: Diagnosis not present

## 2020-01-20 DIAGNOSIS — I4891 Unspecified atrial fibrillation: Secondary | ICD-10-CM | POA: Diagnosis not present

## 2020-01-25 ENCOUNTER — Telehealth: Payer: Self-pay | Admitting: Family Medicine

## 2020-01-25 NOTE — Telephone Encounter (Signed)
Spoke with son Quashon Hubbard via cell phone [facility], planning to go to home [place]. Needs full supervision but unable to get through medicaid due to too high income.  Bedridden, unable to ambulate. Has hospital bed at home. Difficulty getting to doctor's appointments, doesn't want to keep going to doctors or hospital.  Son asks about hospice.  Currently planning to have [place] involved post discharge from SNF - will await response to [facility] - and if not doing well low threshold to get hospice involved. Son agrees with plan.

## 2020-01-29 ENCOUNTER — Telehealth: Payer: Medicare Other

## 2020-01-29 NOTE — Chronic Care Management (AMB) (Deleted)
Chronic Care Management Pharmacy  Name: Charles [name]  MRN: [id number] DOB: 09-26-1932  Chief Complaint/ HPI  Charles [name],  86 y.o.,  male presents for their Initial CCM visit with the clinical pharmacist via telephone.  PCP : Charles Bush, MD  Their chronic conditions include:   Patient concerns: still in rehab, UTI extended stay/urology - wed   Recent hospitalization: hospitalized 12/25/19 COPD exacerbation/AKI; discharged to rehab 01/01/20, last note 01/10/20 - had UTI, on Charles [name] Visits: 12/24/19: Charles [name] -   Consult Visit:  Allergies  Allergen Reactions  . Gabapentin Other (See Comments)    Leg pain, weakness   . Metoprolol Swelling  . Spironolactone Other (See Comments)    Painful gynecomastia  . Amlodipine Other (See Comments)    Edema  . Other Other (See Comments)    Horse serum - myalgias  . Rosiglitazone Maleate Other (See Comments)     Did not help  . Tricor [Fenofibrate] Other (See Comments)    myalgias   Medications: Outpatient Encounter Medications as of 01/29/2020  Medication Sig  . acetaminophen (TYLENOL) 500 MG tablet Take 1 tablet (500 mg total) by mouth 3 (three) times daily.  [name] albuterol (VENTOLIN HFA) 108 (90 Base) MCG/ACT inhaler Inhale 2 puffs into the lungs every 4 (four) hours as needed for wheezing or shortness of breath.  [name] amiodarone (PACERONE) 200 MG tablet Take 1 tablet (200 mg total) by mouth daily.  [name] apixaban (ELIQUIS) 2.5 MG TABS tablet Take 1 tablet (2.5 mg total) by mouth 2 (two) times daily.  . Ascorbic Acid (VITAMIN C PO) Take 1 tablet by mouth daily.  [name] atorvastatin (LIPITOR) 20 MG tablet Take 1 tablet (20 mg total) by mouth daily.  . BD INSULIN SYRINGE U/F 30G X 1/2" 0.5 ML MISC AS DIRECTED.  [name] budesonide-formoterol (SYMBICORT) 160-4.5 MCG/ACT inhaler Inhale 2 puffs into the lungs 2 (two) times daily.  . citalopram (CELEXA) 20 MG tablet Take 1 tablet (20 mg total) by mouth daily.  . finasteride (PROSCAR) 5  MG tablet Take 1 tablet (5 mg total) by mouth daily.  [name] glucose blood (ACCU-CHEK AVIVA PLUS) test strip Use as instructed to check blood sugar three times a day. Dx code E11.8 & E11.65  . guaiFENesin (MUCINEX) 600 MG 12 hr tablet Take 600 mg by mouth 2 (two) times daily as needed for cough or to loosen phlegm.  . insulin glargine (LANTUS) 100 UNIT/ML injection Lantus 22 units in the morning and 20 units at bedtime.  [name] lisinopril (ZESTRIL) 10 MG tablet Take 1 tablet (10 mg total) by mouth daily.  . Magnesium Oxide (MAG-OXIDE) 200 MG TABS Take 1 tablet (200 mg total) by mouth daily.  . metFORMIN (GLUCOPHAGE) 500 MG tablet Take 1 tablet (500 mg total) by mouth 2 (two) times daily with a meal.  . metolazone (ZAROXOLYN) 5 MG tablet Take 1 tablet (5 mg total) by mouth once a week.  . nitroGLYCERIN (NITROSTAT) 0.4 MG SL tablet Place 1 tablet (0.4 mg total) under the tongue every 5 (five) minutes as needed for chest pain.  . pantoprazole (PROTONIX) 40 MG tablet Take 1 tablet (40 mg total) by mouth daily. Take 1 tablet (20 mg total) by mouth daily.  . potassium chloride SA (KLOR-CON) 20 MEQ tablet Take 1 tablet (20 mEq total) every Monday, Wednesday, Friday, Saturday, and Sunday. Take an additional 68meq with Metolazone.  . torsemide (DEMADEX) 20 MG tablet Take 3 tablets (60 mg total) by mouth 3 (  three) times daily. TEMPORARY INCREASE IN DOSE  . traZODone (DESYREL) 50 MG tablet Take 1 tablet (50 mg total) by mouth at bedtime.   No facility-administered encounter medications on file as of 01/29/2020.   Current Diagnosis/Assessment:  {CHL HP [facility] Pharm[facility] Diagnosis/Assessment:[phone]}   Vaccines   Reviewed and discussed patient's vaccination history.    Immunization History  Administered Date(s) Administered  . Influenza Split 07/04/2012  . Influenza Whole 07/04/2005  . Influenza, High Dose Seasonal PF 08/24/2017  . Influenza,inj,Quad PF,6+ Mos 06/25/2015, 07/26/2018, 08/01/2019  .  Influenza,inj,quad, With Preservative 08/02/2018  . Influenza-Unspecified 08/10/2013, 07/04/2014, 07/15/2016  . Pneumococcal Conjugate-13 04/02/2014  . Pneumococcal Polysaccharide-23 10/05/2002  . Td 07/20/2005   Plan: Recommended patient receive   Medication Management  Misc:   OTCs:  Pharmacy/Benefits:  Adherence:  Social support:  Affordability:  CCM Follow Up:  Charles [name], PharmD Clinical Pharmacist [facility] [phone]

## 2020-01-30 ENCOUNTER — Other Ambulatory Visit: Payer: Self-pay

## 2020-01-30 ENCOUNTER — Ambulatory Visit (INDEPENDENT_AMBULATORY_CARE_PROVIDER_SITE_OTHER): Payer: Medicare Other | Admitting: Urology

## 2020-01-30 ENCOUNTER — Encounter: Payer: Self-pay | Admitting: Urology

## 2020-01-30 VITALS — BP 113/68 | HR 78

## 2020-01-30 DIAGNOSIS — R300 Dysuria: Secondary | ICD-10-CM | POA: Diagnosis not present

## 2020-01-30 DIAGNOSIS — N3000 Acute cystitis without hematuria: Secondary | ICD-10-CM

## 2020-01-30 DIAGNOSIS — R350 Frequency of micturition: Secondary | ICD-10-CM | POA: Diagnosis not present

## 2020-01-30 DIAGNOSIS — R102 Pelvic and perineal pain: Secondary | ICD-10-CM

## 2020-01-30 NOTE — Patient Instructions (Signed)

## 2020-01-30 NOTE — Progress Notes (Signed)
01/30/20 9:28 AM   [name] 03-Sep-1932 [id number]  CC: Dysuria, frequency  HPI: I saw Mr. Dircks and his son in urology clinic today for dysuria and urinary frequency.  He is an extremely comorbid 86 year old male with past medical history notable for extensive CAD, COPD on oxygen, anticoagulation, transitioning to hospice care who presents with 2 weeks of worsening dysuria and urinary frequency.  Notably, he was recently hospitalized last month at [facility] with weakness and COPD exacerbation.  Urinalysis was benign at that time, and CT showed no evidence of nephrolithiasis or hydronephrosis.  His diuretic was increased after that hospitalization.  He states that he gave a urine sample at his facility recently that showed a UTI, however these results are unavailable to me.  All prior urinalysis in our epic system over the last year have been completely normal with no microscopic hematuria or infection.  He does report that his symptoms feel similar to a prior episode of prostatitis.  He denies any fevers or chills or flank pain.  He denies any gross hematuria.  He denies a history of recurrent UTIs, but he has had prostatitis once before.  He denies any recent need for catheter, but reports he has been straight cathed once or twice over the last few months at the facility for unclear indications.  He was unable to void for urinalysis today, though he states he feels full.  PVR 0 mL.   PMH: Past Medical History:  Diagnosis Date  . (HFpEF) heart failure with preserved ejection fraction ([place])    a. 11/2019 Echo: EF 60-65%. Mild fxnl MS, mild MR. Mild AS. Ao root 10mm.  . Abdominal aortic atherosclerosis ([place])    by xray  . Arthritis    in lower back (06/24/2015)  . 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 CAD (coronary artery disease) 2004   a. s/p CABG in 2004 with LIMA-LAD, SVG-RCA, SVG-OM, and SVG-RI; b. 10/2018 Cath: 4/4 patent grafts.  . Carotid stenosis  1999   s/p L CEA  . Colon polyp 2005   (Dr. Tiffany Kocher)  . Compression fracture of L1 lumbar vertebra (HCC) remote  . COPD (chronic obstructive pulmonary disease) ([place]) 03/2011   by xray  . COVID-19 09/06/2019  . Depression    hx  . GERD (gastroesophageal reflux disease) 2003   h/o duodenal ulcer per EGD as well as esophagitis  . High-grade Heart block    a. 04/2018 s/p SJM PPM.  . History of colon polyps 2003, 2005   adenomatous Vira Agar)  . History of tobacco abuse   . HLD (hyperlipidemia)   . HTN (hypertension)   . Paroxysmal atrial fibrillation & flutter    a. s/p remote DCCV; b. CHA2DS2VASc = 6-->amio/eliquis.  . Pneumonia due to COVID-19 virus 09/07/2019   Hospitalization 09/2019  . Presence of permanent cardiac pacemaker   . Psoriasis   . Renal artery stenosis ([place]) 2004   70% bilateral, followed by cards  . 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 Severe aortic stenosis    a. 12/2018 s/p TAVR; b. 11/2019 Echo: Mean/peak gradients 11/23 mmHg. AVA 1.54cm^2.  [name] T2DM (type 2 diabetes mellitus) ([place]) 1995  . Urge incontinence of urine     Surgical History: Past Surgical History:  Procedure Laterality Date  . CARDIAC CATHETERIZATION  03/06/2003   No intervention -  recommend CABG  . CARDIOVASCULAR STRESS TEST  11/2010   normal perfusion, no evidence of ischemia, EF 62% post exercise  . CARDIOVASCULAR STRESS TEST  11/28/2012   Mild diaphragmatic attenuation; cannot exclude a focal region of nontransmural inferior scar  . CARDIOVERSION N/A 06/25/2015   Procedure: CARDIOVERSION;  Surgeon: Pixie Casino, MD;  Location: [facility] ENDOSCOPY;  Service: Cardiovascular;  Laterality: N/A;  . CARDIOVERSION N/A 11/02/2016   Procedure: CARDIOVERSION;  Surgeon: [name], MD;  Location: [place];  Service: Cardiovascular;  Laterality: N/A;  . CARDIOVERSION N/A 03/20/2019    Procedure: CARDIOVERSION;  Surgeon: Jerline Pain, MD;  Location: [place];  Service: Cardiovascular;  Laterality: N/A;  . CAROTID ENDARTERECTOMY Left 1999   ([place])  . CATARACT EXTRACTION W/ INTRAOCULAR LENS  IMPLANT, BILATERAL Bilateral 01/2013   [place]  . COLONOSCOPY  2003   colon polyp x3 - adenomatous Tiffany Kocher)  . COLONOSCOPY  10/08/2012   2 TA, diverticulosis, int hem, no rpt rec Tiffany Kocher)  . CORONARY ANGIOPLASTY    . CORONARY ARTERY BYPASS GRAFT  03/07/2003   4v CABG ([place]) with LIMA to LAD, vein graft to RCA, 1st obtuse marginal, and ramus intermedius  . ESOPHAGOGASTRODUODENOSCOPY  10/08/2012   nl esophagus, duodenitis and erosive gastropathy, path - gastropathy no Hpylori, no rpt rec  . LUMBAR EPIDURAL INJECTION  03/2017   L5/S1 ([place])  . PACEMAKER IMPLANT N/A 04/21/2018   Procedure: PACEMAKER IMPLANT -- Dual Chamber;  Surgeon: Deboraha Sprang, MD;  Location: [place] CV LAB;  Service: Cardiovascular;  Laterality: N/A;  . PERIPHERAL VASCULAR CATHETERIZATION N/A 02/02/2016   Procedure: Renal Angiography;  Surgeon: [name], MD;  Location: [place] CV LAB;  Service: Cardiovascular;  Laterality: N/A;  . PERIPHERAL VASCULAR CATHETERIZATION N/A 02/02/2016   Procedure: Abdominal Aortogram;  Surgeon: [name], MD;  Location: [place] CV LAB;  Service: Cardiovascular;  Laterality: N/A;  . RENAL DOPPLER  11/29/2011   Celiac&SMA-demonstrated vessel narrowing suggestive of a greater than 50% diameter reduction. Bilateral renal arteries-demonstrated vessel narrowing of 60-99% diameter reduction. Rt Kidney-mid pole lateral simple cyst noted measuring 1.29x0.76x1.11cm and exophytic cyst outside lower pole measuring 1.23x0.96x1.31. Lft Kidney-lateral mid to lower pole simple cyst measuering-1.24x9.83x1.24  . RIGHT/LEFT HEART CATH AND CORONARY/GRAFT ANGIOGRAPHY N/A 10/20/2017   Procedure: RIGHT/LEFT HEART CATH AND CORONARY/GRAFT ANGIOGRAPHY;  Surgeon: Burnell Blanks, MD;   Location: [place] CV LAB;  Service: Cardiovascular;  Laterality: N/A;  . TEE WITHOUT CARDIOVERSION N/A 06/25/2015   Procedure: TRANSESOPHAGEAL ECHOCARDIOGRAM (TEE);  Surgeon: Pixie Casino, MD;  Location[facility] ENDOSCOPY;  Service: Cardiovascular;  Laterality: N/A;  . TEE WITHOUT CARDIOVERSION N/A 11/02/2016   Procedure: TRANSESOPHAGEAL ECHOCARDIOGRAM (TEE);  Surgeon: [name], MD;  Location: [place];  Service: Cardiovascular;  Laterality: N/A;  . TEE WITHOUT CARDIOVERSION N/A 12/05/2018   Procedure: TRANSESOPHAGEAL ECHOCARDIOGRAM (TEE);  Surgeon: Sherren Mocha, MD;  Location: [place];  Service: Open Heart Surgery;  Laterality: N/A;  . TONSILLECTOMY    . TRANSCATHETER AORTIC VALVE REPLACEMENT, TRANSFEMORAL N/A 12/05/2018   Procedure: TRANSCATHETER AORTIC VALVE REPLACEMENT, TRANSFEMORAL;  Surgeon: Sherren Mocha, MD;  Location: [place];  Service: Open Heart Surgery;  Laterality: N/A;  . UPPER GASTROINTESTINAL ENDOSCOPY  2003   reflux esophagitis, erosive gastropathy, duodenal ulcer    Family History: Family History  Problem Relation Age of Onset  . Hypertension Father   . Heart disease Father   . Cancer Mother        GI cancer  . Diabetes  Sister   . Heart disease Sister   . Stroke Sister   . Hypertension Sister   . Cancer Brother        Lung cancer - nonsmoker  . Cancer Maternal Grandmother   . Heart attack Maternal Grandfather   . Heart attack Paternal Grandmother   . Heart attack Paternal Grandfather   . Cancer Brother        Pancreatic cancer  . Lung disease Brother     Social History:  reports that he quit smoking about 6 years ago. His smoking use included cigarettes. He has a 18.00 pack-year smoking history. He has never used smokeless tobacco. He reports that he does not drink alcohol or use drugs.  Physical Exam: BP 113/68 (BP Location: Left Arm, Patient Position: Sitting, Cuff Size: Normal)   Pulse 78    Constitutional: Extremely frail appearing, in  wheelchair on oxygen GI: Abdomen is soft, nontender, nondistended, no abdominal masses GU: Uncircumcised phallus, patent meatus, testicles 20 cc and descended bilaterally without masses   Laboratory Data: Reviewed, see HPI  Pertinent Imaging: I have personally reviewed the CT stone protocol dated 12/25/2019.  No urologic abnormalities.  Specifically no hydronephrosis or nephrolithiasis.  Assessment & Plan:   In summary, the patient is an extremely comorbid and frail-appearing 86 year old male who presents with urinary symptoms of dysuria and urinary frequency.  He was reportedly told he had a UTI at his living facility, but was not started on any medications.  He is unable to void for urinalysis today and PVR 0 now.  Multiple urinalyses over the last year have been completely benign.  His exam is also benign.  I do long conversation with the patient and his son that his symptoms of frequency are likely related to his increased dose of mild since being discharged from the hospital.  However, with his reportedly positive urinalysis and his symptoms, I do think it is reasonable to try a 10-day course of Bactrim DS twice daily.  He also is interested in trying Flomax to see if this improves his urinary stream, and we discussed the side effects at length.  We discussed the need for repeat urinalysis and culture if he is not improving over the next few weeks.  Return precautions were discussed at length.  Bactrim DS twice daily x10 days Flomax 0.4 mg nightly Follow-up as needed, return precautions discussed at length   Nickolas Madrid, MD 01/30/2020  [facility] [address], [place], [place] [address] [phone]

## 2020-02-01 ENCOUNTER — Ambulatory Visit (INDEPENDENT_AMBULATORY_CARE_PROVIDER_SITE_OTHER): Payer: Medicare Other | Admitting: Cardiovascular Disease

## 2020-02-01 ENCOUNTER — Other Ambulatory Visit: Payer: Self-pay

## 2020-02-01 ENCOUNTER — Encounter: Payer: Self-pay | Admitting: Cardiovascular Disease

## 2020-02-01 DIAGNOSIS — I2510 Atherosclerotic heart disease of native coronary artery without angina pectoris: Secondary | ICD-10-CM

## 2020-02-01 DIAGNOSIS — I480 Paroxysmal atrial fibrillation: Secondary | ICD-10-CM

## 2020-02-01 DIAGNOSIS — Z952 Presence of prosthetic heart valve: Secondary | ICD-10-CM | POA: Diagnosis not present

## 2020-02-01 DIAGNOSIS — I4892 Unspecified atrial flutter: Secondary | ICD-10-CM

## 2020-02-01 DIAGNOSIS — E7800 Pure hypercholesterolemia, unspecified: Secondary | ICD-10-CM

## 2020-02-01 DIAGNOSIS — I10 Essential (primary) hypertension: Secondary | ICD-10-CM | POA: Diagnosis not present

## 2020-02-01 DIAGNOSIS — I5032 Chronic diastolic (congestive) heart failure: Secondary | ICD-10-CM

## 2020-02-01 DIAGNOSIS — Z950 Presence of cardiac pacemaker: Secondary | ICD-10-CM | POA: Diagnosis not present

## 2020-02-01 NOTE — Assessment & Plan Note (Signed)
Status post pacemaker placement 04/21/2018 followed by Dr. Caryl Comes

## 2020-02-01 NOTE — Assessment & Plan Note (Signed)
History of hyperlipidemia on statin therapy lipid profile performed 09/21/2019 revealing total cholesterol 123, LDL of 72 and HDL 34.

## 2020-02-01 NOTE — Patient Instructions (Addendum)
Medication Instructions:  The current medical regimen is effective;  continue present plan and medications.  *If you need a refill on your cardiac medications before your next appointment, please call your pharmacy*   Testing/Procedures: Echocardiogram (in February 2022) - Your physician has requested that you have an echocardiogram. Echocardiography is a painless test that uses sound waves to create images of your heart. It provides your doctor with information about the size and shape of your heart and how well your heart's chambers and valves are working. This procedure takes approximately one hour. There are no restrictions for this procedure. This will be performed at our [facility] location - [address].   Follow-Up: At [facility], you and your health needs are our priority.  As part of our continuing mission to provide you with exceptional heart care, we have created designated Provider Care Teams.  These Care Teams include your primary Cardiologist (physician) and Advanced Practice Providers (APPs -  Physician Assistants and Nurse Practitioners) who all work together to provide you with the care you need, when you need it.  We recommend signing up for the patient portal called "MyChart".  Sign up information is provided on this After Visit Summary.  MyChart is used to connect with patients for Virtual Visits (Telemedicine).  Patients are able to view lab/test results, encounter notes, upcoming appointments, etc.  Non-urgent messages can be sent to your provider as well.   To learn more about what you can do with MyChart, go to [email or web address].    Your next appointment:   Follow up with APP in 6 months  Follow up with Dr.Berry in 12 months Follow up with Dr.Klein at next available

## 2020-02-01 NOTE — Assessment & Plan Note (Signed)
History of CAD status post coronary bypass grafting x4 June 2004 with a LIMA to his LAD, vein to the RCA, obtuse marginal branch and ramus branch.  Cardiac catheterization performed by Dr. Angelena Form 10/20/2017 revealed occluded native vessels with patent grafts.

## 2020-02-01 NOTE — Assessment & Plan Note (Signed)
History of diastolic heart failure on torsemide and metolazone no evidence of volume overload at this time

## 2020-02-01 NOTE — Progress Notes (Signed)
02/01/2020 Charles [name]   1932-08-16  [id number]  Primary Physician [name], MD Primary Cardiologist: [name] MD [name], [name]  HPI:  Charles [name] is a 86 y.o.  mildly overweight, married Caucasian male whose wife was also a patient of mine. She unfortunately passed 12/21/2017. He currently lives with one of his sons and appears to be fairly depressed. He was married for 51 years.He is a father of 24, grandfather to 4 grandchildren. I last saw him in the office 02/27/2019.[name] He is accompanied by his son [name] today.[name] He has a history of CAD status post coronary artery bypass grafting x4 June 2004 with a LIMA to his LAD; a vein to the right coronary artery, obtuse marginal branch and ramus branch. He also was found to have 70% bilateral renal artery stenosis which we have been following by duplex ultrasound. He has had left carotid endarterectomy which we follow as well. His other problems include hypertension, hyperlipidemia and noninsulin-requiring diabetes. He is asymptomatic. His last functional study performed 2/14 was nonischemic. he denies chest pain or shortness of breath. He had new onset atrial flutter back in the fall and underwent TEE guided DC cardioversion by Dr. Debara Pickett 06/25/15 successfully to sinus rhythm. He was placed on Eliquis oral anticoagulation but has ultimately decided not to take this for various reasons.recent renal Dopplers have suggested progression of disease bilaterally left greater than right. His blood pressures have been more difficult to control as well. In addition, his echo performed 06/13/15 was significant for moderate to severe aortic stenosis with a valve area of 0.9 cm2 and a peak gradient of 49 mmHg. since I saw him in the office 5 months ago he's noticed increasing shortness of breath and fatigue over the last 3 weeks. He saw his PCP who did an EKG demonstrating recurrent atrial flutter with slow ventricular response.He underwent  successful outpatient TEE guided cardioversion by Dr. Oval Linsey on 11/02/16 back to sinus rhythm with one shock. He remains on oral anticoagulation. He is currently improved and is currently asymptomatic.  He was evaluated by Dr. Burt Knack for T aVR 10/14/17 and underwent right and left heart cath by Dr. Angelena Form a week later revealing patent grafts with at most moderate aortic stenosis.  When I saw him back in the office at the end of January 2020 he was complaining of progressive dyspnea on exertion most likely related to his severe aortic stenosis.He also had a permanent pacemaker placed by Dr. Caryl Comes 04/21/2018. We did rediscuss the possibility of TAVR whichhe ultimately decided to pursue. He saw Dr. Burt Knack in the office for further evaluation. He ultimately underwent TAVR by Drs. Cooper and Watrous on 12/05/2018 with an Oletta Lamas life science 26 mm bioprosthetic valve without complication. He was discharged home on 12/07/2018. He was readmitted on 12/25/2018 with volume overload and underwent diuresis. He was sent home 2 days later on oral torsemide. He is weighed every day at home and his weight is remained stable. He has had no recurrence of his peripheral edema and denies chest pain or shortness of breath. He is on Eliquis oral anticoagulation.  He saw Dr. Johnsie Cancel virtually 01/26/2019 did not think he was volume overloaded.  I had increased his torsemide from 20 mg twice a day to 20 mg in the morning and 40 mg in the evening.  His follow-up blood work showed a creatinine of 1.58 with normal potassium.  He did has no peripheral edema today and his weight  is 2 pounds lower than it was last month.  He denies chest pain.  His shortness of breath is mostly at night.  He was hospitalized for a week at [facility] 12/25/2019 and discharged on the 30th with respiratory failure, COPD exacerbation requiring inhaled bronchodilators, steroids and antibiotics.  He did have mild increase in his troponins and was seen by Dr.  Rockey Situ who felt this was related to "demand ischemia.  He was discharged home to a skilled nursing facility where he has been for the last month recuperating.  He currently feels clinically improved, denies chest pain or shortness of breath.    Current Meds  Medication Sig  . acetaminophen (TYLENOL) 500 MG tablet Take 1 tablet (500 mg total) by mouth 3 (three) times daily.  [name] amiodarone (PACERONE) 200 MG tablet Take 1 tablet (200 mg total) by mouth daily.  [name] apixaban (ELIQUIS) 2.5 MG TABS tablet Take 1 tablet (2.5 mg total) by mouth 2 (two) times daily.  . Ascorbic Acid (VITAMIN C PO) Take 1 tablet by mouth daily.  [name] atorvastatin (LIPITOR) 20 MG tablet Take 1 tablet (20 mg total) by mouth daily.  . BD INSULIN SYRINGE U/F 30G X 1/2" 0.5 ML MISC AS DIRECTED.  [name] budesonide-formoterol (SYMBICORT) 160-4.5 MCG/ACT inhaler Inhale 2 puffs into the lungs 2 (two) times daily.  . citalopram (CELEXA) 20 MG tablet Take 1 tablet (20 mg total) by mouth daily.  . finasteride (PROSCAR) 5 MG tablet Take 1 tablet (5 mg total) by mouth daily.  [name] glucose blood (ACCU-CHEK AVIVA PLUS) test strip Use as instructed to check blood sugar three times a day. Dx code E11.8 & E11.65  . guaiFENesin (MUCINEX) 600 MG 12 hr tablet Take 600 mg by mouth 2 (two) times daily as needed for cough or to loosen phlegm.  . insulin glargine (LANTUS) 100 UNIT/ML injection Lantus 22 units in the morning and 20 units at bedtime.  . Magnesium Oxide (MAG-OXIDE) 200 MG TABS Take 1 tablet (200 mg total) by mouth daily.  . metFORMIN (GLUCOPHAGE) 500 MG tablet Take 1 tablet (500 mg total) by mouth 2 (two) times daily with a meal.  . metolazone (ZAROXOLYN) 5 MG tablet Take 1 tablet (5 mg total) by mouth once a week.  . nitroGLYCERIN (NITROSTAT) 0.4 MG SL tablet Place 1 tablet (0.4 mg total) under the tongue every 5 (five) minutes as needed for chest pain.  . pantoprazole (PROTONIX) 40 MG tablet Take 1 tablet (40 mg total) by mouth daily. Take 1  tablet (20 mg total) by mouth daily.  . potassium chloride SA (KLOR-CON) 20 MEQ tablet Take 1 tablet (20 mEq total) every Monday, Wednesday, Friday, Saturday, and Sunday. Take an additional 70meq with Metolazone.  . torsemide (DEMADEX) 20 MG tablet Take 3 tablets (60 mg total) by mouth 3 (three) times daily. TEMPORARY INCREASE IN DOSE  . traZODone (DESYREL) 50 MG tablet Take 1 tablet (50 mg total) by mouth at bedtime.     Allergies  Allergen Reactions  . Gabapentin Other (See Comments)    Leg pain, weakness   . Metoprolol Swelling  . Spironolactone Other (See Comments)    Painful gynecomastia  . Amlodipine Other (See Comments)    Edema  . Other Other (See Comments)    Horse serum - myalgias  . Rosiglitazone Maleate Other (See Comments)     Did not help  . Tricor [Fenofibrate] Other (See Comments)    myalgias    Social History   Socioeconomic History  .  Marital status: Widowed    Spouse name: Not on file  . Number of children: Not on file  . Years of education: Not on file  . Highest education level: Not on file  Occupational History  . Not on file  Tobacco Use  . Smoking status: Former Smoker    Packs/day: 0.50    Years: 36.00    Pack years: 18.00    Types: Cigarettes    Quit date: 07/23/2013    Years since quitting: 6.5  . Smokeless tobacco: Never Used  Substance and Sexual Activity  . Alcohol use: No  . Drug use: No  . Sexual activity: Not Currently  Other Topics Concern  . Not on file  Social History Narrative   Caffeine: 4 cups coffee/day, some tea and soda   Lives in [place] by himself.  Family nearby and help out.   Occupation: Retired   Activity: no regular exercise   Diet: good water, fruits/vegetables daily   Social Determinants of Health   Financial Resource Strain: Low Risk   . Difficulty of Paying Living Expenses: Not hard at all  Food Insecurity: No Food Insecurity  . Worried About Charity fundraiser in the Last Year: Never true  . Ran  Out of Food in the Last Year: Never true  Transportation Needs: No Transportation Needs  . Lack of Transportation (Medical): No  . Lack of Transportation (Non-Medical): No  Physical Activity:   . Days of Exercise per Week:   . Minutes of Exercise per Session:   Stress:   . Feeling of Stress :   Social Connections:   . Frequency of Communication with Friends and Family:   . Frequency of Social Gatherings with Friends and Family:   . Attends Religious Services:   . Active Member of Clubs or Organizations:   . Attends Archivist Meetings:   [name] Marital Status:   Intimate Partner Violence:   . Fear of Current or Ex-Partner:   . Emotionally Abused:   [name] Physically Abused:   . Sexually Abused:      Review of Systems: General: negative for chills, fever, night sweats or weight changes.  Cardiovascular: negative for chest pain, dyspnea on exertion, edema, orthopnea, palpitations, paroxysmal nocturnal dyspnea or shortness of breath Dermatological: negative for rash Respiratory: negative for cough or wheezing Urologic: negative for hematuria Abdominal: negative for nausea, vomiting, diarrhea, bright red blood per rectum, melena, or hematemesis Neurologic: negative for visual changes, syncope, or dizziness All other systems reviewed and are otherwise negative except as noted above.    Blood pressure 108/61, pulse 81, SpO2 95 %.  General appearance: alert and no distress Neck: no adenopathy, no carotid bruit, no JVD, supple, symmetrical, trachea midline and thyroid not enlarged, symmetric, no tenderness/mass/nodules Lungs: clear to auscultation bilaterally Heart: regular rate and rhythm, S1, S2 normal, no murmur, click, rub or gallop Extremities: extremities normal, atraumatic, no cyanosis or edema Pulses: 2+ and symmetric Skin: Skin color, texture, turgor normal. No rashes or lesions Neurologic: Alert and oriented X 3, normal strength and tone. Normal symmetric reflexes. Normal  coordination and gait  EKG not performed today  ASSESSMENT AND PLAN:   Paroxysmal atrial flutter (HCC) History of PAF with a paced rhythm on amiodarone and Eliquis  S/P placement of cardiac pacemaker  Status post pacemaker placement 04/21/2018 followed by Dr. Arvil Persons TAVR (transcatheter aortic valve replacement) Status post TAVR by Dr. Burt Knack and [facility] 12/05/2022 severe aortic stenosis.  His most recent  2D echo performed 11/12/2019 revealed normal LV systolic function with a well-functioning aortic bioprosthesis.  We will repeat a 2D echo in 1 year.  HYPERCHOLESTEROLEMIA History of hyperlipidemia on statin therapy lipid profile performed 09/21/2019 revealing total cholesterol 123, LDL of 72 and HDL 34.  Essential hypertension History of essential hypertension blood pressure measured today 108/6061.  He is on lisinopril.  Chronic heart failure with preserved ejection fraction (HFpEF) (HCC) History of diastolic heart failure on torsemide and metolazone no evidence of volume overload at this time  Coronary artery disease involving native coronary artery of native heart without angina pectoris History of CAD status post coronary bypass grafting x4 June 2004 with a LIMA to his LAD, vein to the RCA, obtuse marginal branch and ramus branch.  Cardiac catheterization performed by Dr. Angelena Form 10/20/2017 revealed occluded native vessels with patent grafts.  Atrial fibrillation ([place]) History of atrial fibrillation maintaining sinus rhythm on amiodarone and Eliquis status post permanent transvenous pacemaker implantation by Dr. Caryl Comes 04/21/2018      [name] MD [facility], [facility] 02/01/2020 10:24 AM

## 2020-02-01 NOTE — Assessment & Plan Note (Signed)
History of essential hypertension blood pressure measured today 108/6061.  He is on lisinopril.

## 2020-02-01 NOTE — Assessment & Plan Note (Signed)
History of atrial fibrillation maintaining sinus rhythm on amiodarone and Eliquis status post permanent transvenous pacemaker implantation by Dr. Caryl Comes 04/21/2018

## 2020-02-01 NOTE — Assessment & Plan Note (Signed)
Status post TAVR by Dr. Burt Knack and [facility] 12/05/2022 severe aortic stenosis.  His most recent 2D echo performed 11/12/2019 revealed normal LV systolic function with a well-functioning aortic bioprosthesis.  We will repeat a 2D echo in 1 year.

## 2020-02-01 NOTE — Assessment & Plan Note (Signed)
History of PAF with a paced rhythm on amiodarone and Eliquis

## 2020-02-03 DIAGNOSIS — I4891 Unspecified atrial fibrillation: Secondary | ICD-10-CM | POA: Diagnosis not present

## 2020-02-03 DIAGNOSIS — J449 Chronic obstructive pulmonary disease, unspecified: Secondary | ICD-10-CM | POA: Diagnosis not present

## 2020-02-03 DIAGNOSIS — E119 Type 2 diabetes mellitus without complications: Secondary | ICD-10-CM | POA: Diagnosis not present

## 2020-02-03 DIAGNOSIS — R0602 Shortness of breath: Secondary | ICD-10-CM | POA: Diagnosis not present

## 2020-02-03 DIAGNOSIS — I10 Essential (primary) hypertension: Secondary | ICD-10-CM | POA: Diagnosis not present

## 2020-02-04 ENCOUNTER — Telehealth: Payer: Self-pay

## 2020-02-04 ENCOUNTER — Telehealth: Payer: Self-pay | Admitting: Urology

## 2020-02-04 NOTE — Telephone Encounter (Signed)
Called Kim at [facility] who states that the patient has been seen and accessed by another urology practice.

## 2020-02-04 NOTE — Telephone Encounter (Signed)
Received a phone call from patient's daughter in law, [name], to request that Palliative care resume following patient when he is D/C from [facility]. Reached out to PCP to make sure he is in agreement.

## 2020-02-04 NOTE — Telephone Encounter (Signed)
Kim from [facility] left vm regarding results on pts Urine Culture for Dr. Sunday Corn please call [phone]  with results

## 2020-02-05 ENCOUNTER — Telehealth: Payer: Self-pay | Admitting: Urology

## 2020-02-05 NOTE — Telephone Encounter (Signed)
Called Charles [name] at [employer] [employer] a second time, she states that she needs u/a and urine culture results from our office. Advised her that no results were available, as no urine specimen was obtained.

## 2020-02-05 NOTE — Telephone Encounter (Signed)
Kim from [facility] left vm regarding results on pts Urine Culture for Dr. Sunday Corn please call [phone]  with results.... she called again regarding same message please call Maudie Mercury with results

## 2020-02-06 LAB — HEPATIC FUNCTION PANEL
ALT: 10 (ref 10–40)
AST: 14 (ref 14–40)
Alkaline Phosphatase: 75 (ref 25–125)
Bilirubin, Total: 0.2

## 2020-02-06 LAB — COMPREHENSIVE METABOLIC PANEL: Calcium: 8.9 (ref 8.7–10.7)

## 2020-02-06 LAB — CBC AND DIFFERENTIAL
Hemoglobin: 9.3 — AB (ref 13.5–17.5)
Platelets: 153 (ref 150–399)
WBC: 8.8

## 2020-02-06 LAB — BASIC METABOLIC PANEL
Creatinine: 1.9 — AB (ref 0.6–1.3)
Glucose: 46
Potassium: 4.4 (ref 3.4–5.3)
Sodium: 142 (ref 137–147)

## 2020-02-07 ENCOUNTER — Encounter (HOSPITAL_COMMUNITY): Payer: Medicare Other | Admitting: Internal Medicine

## 2020-02-08 ENCOUNTER — Ambulatory Visit (INDEPENDENT_AMBULATORY_CARE_PROVIDER_SITE_OTHER): Payer: Medicare Other | Admitting: *Deleted

## 2020-02-08 DIAGNOSIS — Z950 Presence of cardiac pacemaker: Secondary | ICD-10-CM | POA: Diagnosis not present

## 2020-02-08 LAB — CUP PACEART REMOTE DEVICE CHECK
Battery Remaining Longevity: 106 mo
Battery Remaining Percentage: 95.5 %
Battery Voltage: 3.01 V
Brady Statistic AP VP Percent: 1 %
Brady Statistic AP VS Percent: 1 %
Brady Statistic AS VP Percent: 99 %
Brady Statistic AS VS Percent: 1 %
Brady Statistic RA Percent Paced: 1 %
Brady Statistic RV Percent Paced: 97 %
Date Time Interrogation Session: 20210506184033
Implantable Lead Implant Date: 20190719
Implantable Lead Implant Date: 20190719
Implantable Lead Location: 753859
Implantable Lead Location: 753860
Implantable Lead Model: 5076
Implantable Lead Model: 5076
Implantable Pulse Generator Implant Date: 20190719
Lead Channel Impedance Value: 410 Ohm
Lead Channel Impedance Value: 440 Ohm
Lead Channel Pacing Threshold Amplitude: 0.5 V
Lead Channel Pacing Threshold Amplitude: 0.625 V
Lead Channel Pacing Threshold Pulse Width: 0.5 ms
Lead Channel Pacing Threshold Pulse Width: 0.5 ms
Lead Channel Sensing Intrinsic Amplitude: 12 mV
Lead Channel Sensing Intrinsic Amplitude: 5 mV
Lead Channel Setting Pacing Amplitude: 1.625
Lead Channel Setting Pacing Amplitude: 2.5 V
Lead Channel Setting Pacing Pulse Width: 0.5 ms
Lead Channel Setting Sensing Sensitivity: 2 mV
Pulse Gen Model: 2272
Pulse Gen Serial Number: 9040268

## 2020-02-08 NOTE — Progress Notes (Signed)
Remote pacemaker transmission.   

## 2020-02-09 ENCOUNTER — Other Ambulatory Visit: Payer: Self-pay | Admitting: Family Medicine

## 2020-02-11 DIAGNOSIS — I5042 Chronic combined systolic (congestive) and diastolic (congestive) heart failure: Secondary | ICD-10-CM | POA: Diagnosis not present

## 2020-02-11 DIAGNOSIS — E1122 Type 2 diabetes mellitus with diabetic chronic kidney disease: Secondary | ICD-10-CM | POA: Diagnosis not present

## 2020-02-11 DIAGNOSIS — I130 Hypertensive heart and chronic kidney disease with heart failure and stage 1 through stage 4 chronic kidney disease, or unspecified chronic kidney disease: Secondary | ICD-10-CM | POA: Diagnosis not present

## 2020-02-11 DIAGNOSIS — N1832 Chronic kidney disease, stage 3b: Secondary | ICD-10-CM | POA: Diagnosis not present

## 2020-02-11 DIAGNOSIS — D631 Anemia in chronic kidney disease: Secondary | ICD-10-CM | POA: Diagnosis not present

## 2020-02-11 NOTE — Telephone Encounter (Signed)
Eliquis Last filled 11/23/19, #60 Last OV:  12/24/19, F2F for hosp bet Next OV:  02/12/20, rehab f/u

## 2020-02-12 ENCOUNTER — Telehealth (INDEPENDENT_AMBULATORY_CARE_PROVIDER_SITE_OTHER): Payer: Medicare Other | Admitting: Family Medicine

## 2020-02-12 ENCOUNTER — Telehealth: Payer: Self-pay | Admitting: *Deleted

## 2020-02-12 ENCOUNTER — Encounter: Payer: Self-pay | Admitting: Family Medicine

## 2020-02-12 VITALS — HR 86 | Temp 99.3°F | Ht 73.0 in | Wt 222.0 lb

## 2020-02-12 DIAGNOSIS — J432 Centrilobular emphysema: Secondary | ICD-10-CM

## 2020-02-12 DIAGNOSIS — Z952 Presence of prosthetic heart valve: Secondary | ICD-10-CM | POA: Diagnosis not present

## 2020-02-12 DIAGNOSIS — J441 Chronic obstructive pulmonary disease with (acute) exacerbation: Secondary | ICD-10-CM

## 2020-02-12 DIAGNOSIS — IMO0002 Reserved for concepts with insufficient information to code with codable children: Secondary | ICD-10-CM

## 2020-02-12 DIAGNOSIS — I5032 Chronic diastolic (congestive) heart failure: Secondary | ICD-10-CM | POA: Diagnosis not present

## 2020-02-12 DIAGNOSIS — E785 Hyperlipidemia, unspecified: Secondary | ICD-10-CM

## 2020-02-12 DIAGNOSIS — E1169 Type 2 diabetes mellitus with other specified complication: Secondary | ICD-10-CM

## 2020-02-12 DIAGNOSIS — E1165 Type 2 diabetes mellitus with hyperglycemia: Secondary | ICD-10-CM

## 2020-02-12 DIAGNOSIS — Z950 Presence of cardiac pacemaker: Secondary | ICD-10-CM

## 2020-02-12 DIAGNOSIS — N183 Chronic kidney disease, stage 3 unspecified: Secondary | ICD-10-CM

## 2020-02-12 DIAGNOSIS — I4892 Unspecified atrial flutter: Secondary | ICD-10-CM

## 2020-02-12 DIAGNOSIS — F331 Major depressive disorder, recurrent, moderate: Secondary | ICD-10-CM

## 2020-02-12 DIAGNOSIS — E1122 Type 2 diabetes mellitus with diabetic chronic kidney disease: Secondary | ICD-10-CM

## 2020-02-12 DIAGNOSIS — N390 Urinary tract infection, site not specified: Secondary | ICD-10-CM

## 2020-02-12 DIAGNOSIS — G2581 Restless legs syndrome: Secondary | ICD-10-CM | POA: Diagnosis not present

## 2020-02-12 DIAGNOSIS — J9610 Chronic respiratory failure, unspecified whether with hypoxia or hypercapnia: Secondary | ICD-10-CM

## 2020-02-12 DIAGNOSIS — E118 Type 2 diabetes mellitus with unspecified complications: Secondary | ICD-10-CM

## 2020-02-12 MED ORDER — ROPINIROLE HCL 0.25 MG PO TABS: 0.2500 mg | ORAL_TABLET | Freq: Every day | ORAL | 9 refills | Status: AC

## 2020-02-12 MED ORDER — LANTUS SOLOSTAR 100 UNIT/ML ~~LOC~~ SOPN: 21.0000 [IU] | PEN_INJECTOR | Freq: Two times a day (BID) | SUBCUTANEOUS | 6 refills | Status: AC

## 2020-02-12 NOTE — Telephone Encounter (Signed)
Spoke with Charles [name] informing him Dr. G is giving verbal orders for services requested.  

## 2020-02-12 NOTE — Telephone Encounter (Signed)
Agree with all of this. Thank you.

## 2020-02-12 NOTE — Telephone Encounter (Signed)
Skeet Simmer PT with [facility] called stating that patient is back home and he saw him yesterday. Skeet Simmer is requesting PT twice a week for 3 weeks and once a week for 2 weeks. Skeet Simmer stated that patient's daughter is requesting skilled nursing for medication and disease management. A nurse aide to help with bathing. Skeet Simmer is also requesting an OT evaluation.

## 2020-02-12 NOTE — Progress Notes (Signed)
Virtual visit completed through [place]. Due to national recommendations of social distancing due to COVID-19, a virtual visit is felt to be most appropriate for this patient at this time. Reviewed limitations of a virtual visit.   Patient location: home Also on call: DIL [name] and Son Event organiser Provider location: Financial controller at [facility], office If any vitals were documented, they were collected by patient at home unless specified below.    Pulse 86   Temp 99.3 F (37.4 C)   Ht 6\' 1"  (1.854 m)   Wt 222 lb (100.7 kg)   SpO2 98%   BMI 29.29 kg/m  on 3L O2 by [place] (increase from 2L while at [facility])  CC: SNF discharge follow up visit Subjective:    Patient ID: Charles [name], male    DOB: [birth year], 86 y.o.   MRN: [id number]  HPI: Charles [name] is a 86 y.o. male presenting on 02/12/2020 for Hospitalization Follow-up (Rehab f/u.)   Recent hospitalization for acute COPD exacerbation. Known parox atrial fib/flutter s/p TAVR (early 2020), s/p cardiac pacemaker, HFpEF, CAD.   H/o recurrent UTIs currently treating with bactrim DS BID. Saw urology [facility]) 01/30/2020 - rec 10d bactrim course and flomax. Planned cathed UA if persistent symptoms. This regimen has been completed with resolution of UTI symptoms (difficulty urinating and dysuria and painful at penis).   RLS - requip 0.25mg  nightly started during latest SNF stay. Finds this is helpful.  Atrial fibrillation/flutter - on amiodarone and eliquis daily.  DM - changed to lantus 36 u daily at [facility] - he returned to previous dose of lantus 22u/20u daily. They are checking sugars BID - 138-390. Patient has been using vials, however they ask about pens - DIL will request insulin teaching through pharmacist  Lab Results  Component Value Date   HGBA1C 8.4 (H) 12/26/2019    Had bowel accident - doesn't like using diaper - continued urinary incontinence.  Continues torsemide 40mg  daily, 20lb weight gain since he's been home despite this.    Discharged from [facility], palliative care has re established with patient. [facility] home health also involved - PT, OT, SN and nurse aide.   Ongoing trouble with dyspnea especially orthopnea.  He uses manual wheelchair for mobility around the house. Needs supervision to use walker. Currently living at home alone. Family able to come out several times a day to assist, check on him.  Now has hospital bed at home.      Relevant past medical, surgical, family and social history reviewed and updated as indicated. Interim medical history since our last visit reviewed. Allergies and medications reviewed and updated. Outpatient Medications Prior to Visit  Medication Sig Dispense Refill  . acetaminophen (TYLENOL) 500 MG tablet Take 1 tablet (500 mg total) by mouth 3 (three) times daily.    [name] albuterol (VENTOLIN HFA) 108 (90 Base) MCG/ACT inhaler Inhale 2 puffs into the lungs every 4 (four) hours as needed for wheezing or shortness of breath. 8 g 0  . amiodarone (PACERONE) 200 MG tablet Take 1 tablet (200 mg total) by mouth daily. 30 tablet 1  . Ascorbic Acid (VITAMIN C PO) Take 1 tablet by mouth daily.    [name] atorvastatin (LIPITOR) 20 MG tablet Take 1 tablet (20 mg total) by mouth daily. 90 tablet 1  . BD INSULIN SYRINGE U/F 30G X 1/2" 0.5 ML MISC AS DIRECTED. 100 each 0  . budesonide-formoterol (SYMBICORT) 160-4.5 MCG/ACT inhaler Inhale 2 puffs into the lungs 2 (  two) times daily. 1 Inhaler 2  . citalopram (CELEXA) 20 MG tablet Take 1 tablet (20 mg total) by mouth daily. 90 tablet 2  . ELIQUIS 2.5 MG TABS tablet Take 1 tablet (2.5 mg total) by mouth 2 (two) times daily. 60 tablet 6  . finasteride (PROSCAR) 5 MG tablet Take 1 tablet (5 mg total) by mouth daily. 30 tablet 0  . glucose blood (ACCU-CHEK AVIVA PLUS) test strip Use as instructed to check blood sugar three times a day. Dx code E11.8 & E11.65 300 strip 1  . guaiFENesin (MUCINEX) 600 MG 12 hr tablet Take 600 mg by mouth 2 (two) times  daily as needed for cough or to loosen phlegm.    . Magnesium Oxide (MAG-OXIDE) 200 MG TABS Take 1 tablet (200 mg total) by mouth daily. 30 tablet 3  . metFORMIN (GLUCOPHAGE) 500 MG tablet Take 1 tablet (500 mg total) by mouth 2 (two) times daily with a meal.    . metolazone (ZAROXOLYN) 5 MG tablet Take 1 tablet (5 mg total) by mouth once a week.    . nitroGLYCERIN (NITROSTAT) 0.4 MG SL tablet Place 1 tablet (0.4 mg total) under the tongue every 5 (five) minutes as needed for chest pain. 90 tablet 3  . pantoprazole (PROTONIX) 40 MG tablet Take 1 tablet (40 mg total) by mouth daily. Take 1 tablet (20 mg total) by mouth daily. 30 tablet 1  . potassium chloride SA (KLOR-CON) 20 MEQ tablet Take 1 tablet (20 mEq total) every Monday, Wednesday, Friday, Saturday, and Sunday. Take an additional 58meq with Metolazone. 30 tablet 11  . torsemide (DEMADEX) 20 MG tablet Take 3 tablets (60 mg total) by mouth 3 (three) times daily. TEMPORARY INCREASE IN DOSE    . traZODone (DESYREL) 50 MG tablet Take 1 tablet (50 mg total) by mouth at bedtime. 30 tablet 0  . insulin glargine (LANTUS) 100 UNIT/ML injection Lantus 22 units in the morning and 20 units at bedtime. 30 mL 0  . lisinopril (ZESTRIL) 10 MG tablet Take 1 tablet (10 mg total) by mouth daily. 30 tablet 6   No facility-administered medications prior to visit.     Per HPI unless specifically indicated in ROS section below Review of Systems Objective:  Pulse 86   Temp 99.3 F (37.4 C)   Ht 6\' 1"  (1.854 m)   Wt 222 lb (100.7 kg)   SpO2 98%   BMI 29.29 kg/m   Wt Readings from Last 3 Encounters:  02/12/20 222 lb (100.7 kg)  01/01/20 236 lb 11.2 oz (107.4 kg)  12/19/19 235 lb (106.6 kg)      Physical exam: Gen: alert, NAD, not ill appearing Pulm: speaks in complete sentences without increased work of breathing Psych: normal mood, normal thought content      Lab Results  Component Value Date   CREATININE 1.9 (A) 02/06/2020   BUN 52 (H)  01/01/2020   NA 142 02/06/2020   K 4.4 02/06/2020   CL 99 01/01/2020   CO2 30 01/01/2020    Assessment & Plan:   Problem List Items Addressed This Visit    S/P TAVR (transcatheter aortic valve replacement) (Chronic)   S/P placement of cardiac pacemaker  (Chronic)   RLS (restless legs syndrome)    Diagnosed at SNF, now on low dose requip with benefit.       Recurrent UTI    Saw urology, treated with 10d bactrim course with benefit.       Paroxysmal atrial  flutter (HCC) (Chronic)    Continue eliquis, amiodarone.       MDD (major depressive disorder), recurrent episode, moderate (HCC)    Continues celexa and trazodone.       Hyperlipidemia associated with type 2 diabetes mellitus ([place])    On lipitor - continue at this time.       Relevant Medications   insulin glargine (LANTUS SOLOSTAR) 100 UNIT/ML Solostar Pen   Diabetes mellitus type 2, uncontrolled, with complications ([place])    He had some hypoglycemia while in SNF on 36u lantus daily - now home he has returned to 22/20u daily dosing, with high readings noted especially in evenings. He has been administering lantus using vials, safe to do using pens given difficulty with injection - will Rx lantus solostar pens and family will help with administration.       Relevant Medications   insulin glargine (LANTUS SOLOSTAR) 100 UNIT/ML Solostar Pen   COPD exacerbation ([place])    Reason for latest hospitalization. Breathing seems now back at baseline.       CKD stage 3 due to type 2 diabetes mellitus (HCC)    Recent worsening noted, likely due to diuretic use. Difficult situation with noted weight gain.       Relevant Medications   insulin glargine (LANTUS SOLOSTAR) 100 UNIT/ML Solostar Pen   Chronic respiratory failure ([place])    Continued supplemental oxygen need. Family has increased oxygen from 2L to 3L.       Chronic heart failure with preserved ejection fraction (HFpEF) (HCC) - Primary    Recurrent episodes of acute  exacerbations of diastolic CHF that lead to hospitalization. Since home family note 20 lb weight gain despite torsemide and metolazone. Possibly related to dietary liberties. Discussed options for goals of care with patient - whether to maximally treat this disease process with very strict adherence to dietary recommendations vs comfort care at home and effort to avoid future hospitalizations. They are interested in further exploring hospice at home - will ask palliative care to evaluate for eligibility.       Centrilobular emphysema (HCC)    Continue symbicort and PRN albuterol, guaifenesin.           Meds ordered this encounter  Medications  . rOPINIRole (REQUIP) 0.25 MG tablet    Sig: Take 1 tablet (0.25 mg total) by mouth at bedtime.    Dispense:  30 tablet    Refill:  9  . insulin glargine (LANTUS SOLOSTAR) 100 UNIT/ML Solostar Pen    Sig: Inject 21 Units into the skin 2 (two) times daily.    Dispense:  5 pen    Refill:  6   No orders of the defined types were placed in this encounter.   I discussed the assessment and treatment plan with the patient. The patient was provided an opportunity to ask questions and all were answered. The patient agreed with the plan and demonstrated an understanding of the instructions. The patient was advised to call back or seek an in-person evaluation if the symptoms worsen or if the condition fails to improve as anticipated.  Follow up plan: No follow-ups on file.  [name], MD

## 2020-02-13 ENCOUNTER — Telehealth: Payer: Self-pay | Admitting: *Deleted

## 2020-02-13 ENCOUNTER — Other Ambulatory Visit: Payer: Medicare Other | Admitting: Adult Health Nurse Practitioner

## 2020-02-13 ENCOUNTER — Other Ambulatory Visit: Payer: Self-pay

## 2020-02-13 DIAGNOSIS — I5033 Acute on chronic diastolic (congestive) heart failure: Secondary | ICD-10-CM

## 2020-02-13 DIAGNOSIS — Z515 Encounter for palliative care: Secondary | ICD-10-CM

## 2020-02-13 DIAGNOSIS — I5032 Chronic diastolic (congestive) heart failure: Secondary | ICD-10-CM

## 2020-02-13 NOTE — Progress Notes (Signed)
[facility] Consult Note Telephone: [phone]  Fax: [phone]  PATIENT NAME: Charles [name] DOB: 01-20-1932 MRN: [id number]  PRIMARY CARE PROVIDER:   [name], MD  REFERRING PROVIDER:  [name], MD [place],   [id number]  RESPONSIBLE PARTY:   Primary contact is daughter in law, Shykeem Malnar [phone]       RECOMMENDATIONS and PLAN:  1.  Advanced care planning. Patient has MOST form in Epic indicating DNR and comfort measures.  Today he voices wanting quality of life versus quantity and would like to be made comfortable at home.    2.  CHF/COPD/DMT2.  He was hospitalized in November 2020 for Radium and had rehospitalization with COVID in December 2020.  Prior he mostly used O2 at 2L and used a wheelchair but was able to walk short distances in his apartment.  Since having COVID now is more dyspneic and uses O2 at 3L.  He gets very SOB with transfers from bed to wheelchair.  He does not walk now due to dyspnea.  He had COPD exacerbation in March this year and subsequently went to [facility] for rehab.  He has been back home for one week and daughter in law states that he has gained 20 pounds in fluid and he is on torsemide 40 mg BID. He has 2+ edema of bilateral legs and nonpitting edema noted to bilateral hands.  Daughter in law states that she has noticed an increase possibly due to fluid in his abdomen as well. He lies flat in bed but states having to get up several times during the night because of dyspnea. He does not elevate the head of the bed or prop up with pillows because of chronic neck pain.  His lung sounds are extremely diminished but I did not hear any crackles or wheezing.  He managed to lose 30 pounds while [facility] due to getting  a more appropriate diet for his diabetes and heart failure.   Due to his dyspnea he is unable to spend much time preparing meals so he eats a lot of processed  prepackaged foods with a lot of salt and sugar added.  Current weight is 222 pounds BMI 29.29.  Son and daughter in law state that at PCP visit yesterday that he has voiced that despite knowing that it could shorten his life, he wants to eat what he wants and to be made comfortable at home.  We discussed hospice and he and family would like to pursue it if eligible.  He is currently on home health services and discussed having to stop this if wanting hospice, they expressed understanding.    Have reached out to hospice physicians who are in agreement with hospice services.  Have reached out to PCP with recommendation for hospice and emailed Dr. [name] via [facility] as well.    I spent 80 minutes providing this consultation,  from 2:00 to 3:20 including time spent with patient/family, chart review, provider coordination, documentation. More than 50% of the time in this consultation was spent coordinating communication.   HISTORY OF PRESENT ILLNESS:  [name] is a 86 y.o. year old male with multiple medical problems including CHF, COPD, a-fib s/p pacemaker, DMT2, CKD stage 3, recurrent UTIs. Palliative Care was asked to help address goals of care.   CODE STATUS:   PPS: 40% HOSPICE ELIGIBILITY/DIAGNOSIS: TBD  PHYSICAL EXAM:  HR 73  O2 99% on 3L at  rest General: NAD, frail appearing Cardiovascular: regular rate and rhythm Pulmonary: lung sounds diminished but no crackles or wheezing heard; normal respiratory effort Abdomen: obese, soft, nontender, + bowel sounds GU: no suprapubic tenderness Extremities: 2+ pitting edema to bilateral lower extremities and nonpitting edema noted to bilateral hands, no joint deformities Skin: no rashes on exposed skin Neurological: Weakness but otherwise nonfocal   PAST MEDICAL HISTORY:  Past Medical History:  Diagnosis Date  . (HFpEF) heart failure with preserved ejection fraction ([place])    a. 11/2019 Echo: EF 60-65%. Mild fxnl MS, mild MR. Mild AS. Ao root  98mm.  . Abdominal aortic atherosclerosis ([place])    by xray  . Arthritis    in lower back (06/24/2015)  . 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 CAD (coronary artery disease) 2004   a. s/p CABG in 2004 with LIMA-LAD, SVG-RCA, SVG-OM, and SVG-RI; b. 10/2018 Cath: 4/4 patent grafts.  . Carotid stenosis 1999   s/p L CEA  . Colon polyp 2005   (Dr. Tiffany Kocher)  . Compression fracture of L1 lumbar vertebra (HCC) remote  . COPD (chronic obstructive pulmonary disease) ([place]) 03/2011   by xray  . COVID-19 09/06/2019  . Depression    hx  . GERD (gastroesophageal reflux disease) 2003   h/o duodenal ulcer per EGD as well as esophagitis  . High-grade Heart block    a. 04/2018 s/p SJM PPM.  . History of colon polyps 2003, 2005   adenomatous Vira Agar)  . History of tobacco abuse   . HLD (hyperlipidemia)   . HTN (hypertension)   . Paroxysmal atrial fibrillation & flutter    a. s/p remote DCCV; b. CHA2DS2VASc = 6-->amio/eliquis.  . Pneumonia due to COVID-19 virus 09/07/2019   Hospitalization 09/2019  . Presence of permanent cardiac pacemaker   . Psoriasis   . Renal artery stenosis ([place]) 2004   70% bilateral, followed by cards  . 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 Severe aortic stenosis    a. 12/2018 s/p TAVR; b. 11/2019 Echo: Mean/peak gradients 11/23 mmHg. AVA 1.54cm^2.  [name] T2DM (type 2 diabetes mellitus) ([place]) 1995  . Urge incontinence of urine     SOCIAL HX:  Social History   Tobacco Use  . Smoking status: Former Smoker    Packs/day: 0.50    Years: 36.00    Pack years: 18.00    Types: Cigarettes    Quit date: 07/23/2013    Years since quitting: 6.5  . Smokeless tobacco: Never Used  Substance Use Topics  . Alcohol use: No    ALLERGIES:  Allergies  Allergen Reactions  . Gabapentin Other (See Comments)    Leg pain, weakness   . Metoprolol  Swelling  . Spironolactone Other (See Comments)    Painful gynecomastia  . Amlodipine Other (See Comments)    Edema  . Other Other (See Comments)    Horse serum - myalgias  . Rosiglitazone Maleate Other (See Comments)     Did not help  . Tricor [Fenofibrate] Other (See Comments)    myalgias     PERTINENT MEDICATIONS:  Outpatient Encounter Medications as of 02/13/2020  Medication Sig  . acetaminophen (TYLENOL) 500 MG tablet Take 1 tablet (500 mg total) by mouth 3 (three) times daily.  [name] albuterol (VENTOLIN HFA) 108 (90 Base) MCG/ACT inhaler Inhale 2 puffs into the  lungs every 4 (four) hours as needed for wheezing or shortness of breath.  [name] amiodarone (PACERONE) 200 MG tablet Take 1 tablet (200 mg total) by mouth daily.  . Ascorbic Acid (VITAMIN C PO) Take 1 tablet by mouth daily.  [name] atorvastatin (LIPITOR) 20 MG tablet Take 1 tablet (20 mg total) by mouth daily.  . BD INSULIN SYRINGE U/F 30G X 1/2" 0.5 ML MISC AS DIRECTED.  [name] budesonide-formoterol (SYMBICORT) 160-4.5 MCG/ACT inhaler Inhale 2 puffs into the lungs 2 (two) times daily.  . citalopram (CELEXA) 20 MG tablet Take 1 tablet (20 mg total) by mouth daily.  [name] ELIQUIS 2.5 MG TABS tablet Take 1 tablet (2.5 mg total) by mouth 2 (two) times daily.  . finasteride (PROSCAR) 5 MG tablet Take 1 tablet (5 mg total) by mouth daily.  [name] glucose blood (ACCU-CHEK AVIVA PLUS) test strip Use as instructed to check blood sugar three times a day. Dx code E11.8 & E11.65  . guaiFENesin (MUCINEX) 600 MG 12 hr tablet Take 600 mg by mouth 2 (two) times daily as needed for cough or to loosen phlegm.  . insulin glargine (LANTUS SOLOSTAR) 100 UNIT/ML Solostar Pen Inject 21 Units into the skin 2 (two) times daily.  [name] lisinopril (ZESTRIL) 10 MG tablet Take 1 tablet (10 mg total) by mouth daily.  . Magnesium Oxide (MAG-OXIDE) 200 MG TABS Take 1 tablet (200 mg total) by mouth daily.  . metFORMIN (GLUCOPHAGE) 500 MG tablet Take 1 tablet (500 mg total) by mouth 2  (two) times daily with a meal.  . metolazone (ZAROXOLYN) 5 MG tablet Take 1 tablet (5 mg total) by mouth once a week.  . nitroGLYCERIN (NITROSTAT) 0.4 MG SL tablet Place 1 tablet (0.4 mg total) under the tongue every 5 (five) minutes as needed for chest pain.  . pantoprazole (PROTONIX) 40 MG tablet Take 1 tablet (40 mg total) by mouth daily. Take 1 tablet (20 mg total) by mouth daily.  . potassium chloride SA (KLOR-CON) 20 MEQ tablet Take 1 tablet (20 mEq total) every Monday, Wednesday, Friday, Saturday, and Sunday. Take an additional 34meq with Metolazone.  [name] rOPINIRole (REQUIP) 0.25 MG tablet Take 1 tablet (0.25 mg total) by mouth at bedtime.  . torsemide (DEMADEX) 20 MG tablet Take 3 tablets (60 mg total) by mouth 3 (three) times daily. TEMPORARY INCREASE IN DOSE  . traZODone (DESYREL) 50 MG tablet Take 1 tablet (50 mg total) by mouth at bedtime.   No facility-administered encounter medications on file as of 02/13/2020.     Anahy Esh Jenetta Downer, NP

## 2020-02-13 NOTE — Telephone Encounter (Signed)
Hanley Ben NP with [facility] left a voicemail stating that she saw the patient today/ Amy stated that patient is ready to pursue Hospice Services.. Amy stated that she and the hospice doctors feel that he is eligible for hospice and she requested a call back about pursuing this.

## 2020-02-13 NOTE — Telephone Encounter (Signed)
Called Amy with [facility], left message.  Ok to proceed with hospice. Referral placed.

## 2020-02-14 DIAGNOSIS — D631 Anemia in chronic kidney disease: Secondary | ICD-10-CM | POA: Diagnosis not present

## 2020-02-14 DIAGNOSIS — E1122 Type 2 diabetes mellitus with diabetic chronic kidney disease: Secondary | ICD-10-CM | POA: Diagnosis not present

## 2020-02-14 DIAGNOSIS — I5042 Chronic combined systolic (congestive) and diastolic (congestive) heart failure: Secondary | ICD-10-CM | POA: Diagnosis not present

## 2020-02-14 DIAGNOSIS — I130 Hypertensive heart and chronic kidney disease with heart failure and stage 1 through stage 4 chronic kidney disease, or unspecified chronic kidney disease: Secondary | ICD-10-CM | POA: Diagnosis not present

## 2020-02-14 DIAGNOSIS — N1832 Chronic kidney disease, stage 3b: Secondary | ICD-10-CM | POA: Diagnosis not present

## 2020-02-14 NOTE — Telephone Encounter (Signed)
Called [facility]osp[facility] and spoke with Collie Siad, Dr [name] called yesterday and left message with Amy Olena Heckle that it is ok to proceed with Hospice and he will be the attending M.D. They will proceed with Hospice for the patient.

## 2020-02-15 ENCOUNTER — Encounter: Payer: Self-pay | Admitting: Family Medicine

## 2020-02-16 DIAGNOSIS — G2581 Restless legs syndrome: Secondary | ICD-10-CM | POA: Insufficient documentation

## 2020-02-16 NOTE — Assessment & Plan Note (Signed)
Diagnosed at SNF, now on low dose requip with benefit.

## 2020-02-16 NOTE — Assessment & Plan Note (Signed)
Reason for latest hospitalization. Breathing seems now back at baseline.

## 2020-02-16 NOTE — Assessment & Plan Note (Signed)
Continue eliquis, amiodarone.

## 2020-02-16 NOTE — Assessment & Plan Note (Signed)
Recent worsening noted, likely due to diuretic use. Difficult situation with noted weight gain.

## 2020-02-16 NOTE — Assessment & Plan Note (Signed)
Continue symbicort and PRN albuterol, guaifenesin.

## 2020-02-16 NOTE — Assessment & Plan Note (Signed)
On lipitor - continue at this time.

## 2020-02-16 NOTE — Assessment & Plan Note (Addendum)
Recurrent episodes of acute exacerbations of diastolic CHF that lead to hospitalization. Since home family note 20 lb weight gain despite torsemide and metolazone. Possibly related to dietary liberties. Discussed options for goals of care with patient - whether to maximally treat this disease process with very strict adherence to dietary recommendations vs comfort care at home and effort to avoid future hospitalizations. They are interested in further exploring hospice at home - will ask palliative care to evaluate for eligibility.

## 2020-02-16 NOTE — Assessment & Plan Note (Addendum)
Continued supplemental oxygen need. Family has increased oxygen from 2L to 3L.

## 2020-02-16 NOTE — Assessment & Plan Note (Signed)
He had some hypoglycemia while in SNF on 36u lantus daily - now home he has returned to 22/20u daily dosing, with high readings noted especially in evenings. He has been administering lantus using vials, safe to do using pens given difficulty with injection - will Rx lantus solostar pens and family will help with administration.

## 2020-02-16 NOTE — Assessment & Plan Note (Signed)
Saw urology, treated with 10d bactrim course with benefit.

## 2020-02-16 NOTE — Assessment & Plan Note (Signed)
Continues celexa and trazodone.

## 2020-02-18 ENCOUNTER — Telehealth (HOSPITAL_COMMUNITY): Payer: Medicare Other | Admitting: Internal Medicine

## 2020-02-22 DIAGNOSIS — Z9981 Dependence on supplemental oxygen: Secondary | ICD-10-CM

## 2020-02-22 DIAGNOSIS — M4802 Spinal stenosis, cervical region: Secondary | ICD-10-CM

## 2020-02-22 DIAGNOSIS — G319 Degenerative disease of nervous system, unspecified: Secondary | ICD-10-CM

## 2020-02-22 DIAGNOSIS — K219 Gastro-esophageal reflux disease without esophagitis: Secondary | ICD-10-CM

## 2020-02-22 DIAGNOSIS — Z79899 Other long term (current) drug therapy: Secondary | ICD-10-CM

## 2020-02-22 DIAGNOSIS — N400 Enlarged prostate without lower urinary tract symptoms: Secondary | ICD-10-CM

## 2020-02-22 DIAGNOSIS — E785 Hyperlipidemia, unspecified: Secondary | ICD-10-CM

## 2020-02-22 DIAGNOSIS — Z7982 Long term (current) use of aspirin: Secondary | ICD-10-CM

## 2020-02-22 DIAGNOSIS — D631 Anemia in chronic kidney disease: Secondary | ICD-10-CM

## 2020-02-22 DIAGNOSIS — Z952 Presence of prosthetic heart valve: Secondary | ICD-10-CM

## 2020-02-22 DIAGNOSIS — M76891 Other specified enthesopathies of right lower limb, excluding foot: Secondary | ICD-10-CM

## 2020-02-22 DIAGNOSIS — S8001XD Contusion of right knee, subsequent encounter: Secondary | ICD-10-CM

## 2020-02-22 DIAGNOSIS — M47812 Spondylosis without myelopathy or radiculopathy, cervical region: Secondary | ICD-10-CM

## 2020-02-22 DIAGNOSIS — Z951 Presence of aortocoronary bypass graft: Secondary | ICD-10-CM

## 2020-02-22 DIAGNOSIS — Z7901 Long term (current) use of anticoagulants: Secondary | ICD-10-CM

## 2020-02-22 DIAGNOSIS — M8588 Other specified disorders of bone density and structure, other site: Secondary | ICD-10-CM

## 2020-02-22 DIAGNOSIS — Z794 Long term (current) use of insulin: Secondary | ICD-10-CM

## 2020-02-22 DIAGNOSIS — J9811 Atelectasis: Secondary | ICD-10-CM

## 2020-02-22 DIAGNOSIS — M159 Polyosteoarthritis, unspecified: Secondary | ICD-10-CM

## 2020-02-22 DIAGNOSIS — M47816 Spondylosis without myelopathy or radiculopathy, lumbar region: Secondary | ICD-10-CM

## 2020-02-22 DIAGNOSIS — M810 Age-related osteoporosis without current pathological fracture: Secondary | ICD-10-CM

## 2020-02-22 DIAGNOSIS — I4891 Unspecified atrial fibrillation: Secondary | ICD-10-CM

## 2020-02-22 DIAGNOSIS — J449 Chronic obstructive pulmonary disease, unspecified: Secondary | ICD-10-CM

## 2020-02-22 DIAGNOSIS — M47818 Spondylosis without myelopathy or radiculopathy, sacral and sacrococcygeal region: Secondary | ICD-10-CM

## 2020-02-22 DIAGNOSIS — Z7951 Long term (current) use of inhaled steroids: Secondary | ICD-10-CM

## 2020-02-22 DIAGNOSIS — I6782 Cerebral ischemia: Secondary | ICD-10-CM

## 2020-02-22 DIAGNOSIS — I5042 Chronic combined systolic (congestive) and diastolic (congestive) heart failure: Secondary | ICD-10-CM | POA: Diagnosis not present

## 2020-02-22 DIAGNOSIS — Z950 Presence of cardiac pacemaker: Secondary | ICD-10-CM

## 2020-02-22 DIAGNOSIS — I700 Atherosclerosis of aorta: Secondary | ICD-10-CM

## 2020-02-22 DIAGNOSIS — N1832 Chronic kidney disease, stage 3b: Secondary | ICD-10-CM | POA: Diagnosis not present

## 2020-02-22 DIAGNOSIS — N179 Acute kidney failure, unspecified: Secondary | ICD-10-CM

## 2020-02-22 DIAGNOSIS — I2510 Atherosclerotic heart disease of native coronary artery without angina pectoris: Secondary | ICD-10-CM

## 2020-02-22 DIAGNOSIS — M488X2 Other specified spondylopathies, cervical region: Secondary | ICD-10-CM

## 2020-02-22 DIAGNOSIS — F329 Major depressive disorder, single episode, unspecified: Secondary | ICD-10-CM

## 2020-02-22 DIAGNOSIS — M4602 Spinal enthesopathy, cervical region: Secondary | ICD-10-CM

## 2020-02-22 DIAGNOSIS — M47817 Spondylosis without myelopathy or radiculopathy, lumbosacral region: Secondary | ICD-10-CM

## 2020-02-22 DIAGNOSIS — M2578 Osteophyte, vertebrae: Secondary | ICD-10-CM

## 2020-02-22 DIAGNOSIS — N281 Cyst of kidney, acquired: Secondary | ICD-10-CM

## 2020-02-22 DIAGNOSIS — I4892 Unspecified atrial flutter: Secondary | ICD-10-CM

## 2020-02-22 DIAGNOSIS — K5730 Diverticulosis of large intestine without perforation or abscess without bleeding: Secondary | ICD-10-CM

## 2020-02-22 DIAGNOSIS — J9611 Chronic respiratory failure with hypoxia: Secondary | ICD-10-CM

## 2020-02-22 DIAGNOSIS — Z9181 History of falling: Secondary | ICD-10-CM

## 2020-02-22 DIAGNOSIS — E1122 Type 2 diabetes mellitus with diabetic chronic kidney disease: Secondary | ICD-10-CM | POA: Diagnosis not present

## 2020-02-22 DIAGNOSIS — I130 Hypertensive heart and chronic kidney disease with heart failure and stage 1 through stage 4 chronic kidney disease, or unspecified chronic kidney disease: Secondary | ICD-10-CM | POA: Diagnosis not present

## 2020-02-25 DIAGNOSIS — I459 Conduction disorder, unspecified: Secondary | ICD-10-CM | POA: Insufficient documentation

## 2020-02-25 DIAGNOSIS — I429 Cardiomyopathy, unspecified: Secondary | ICD-10-CM | POA: Insufficient documentation

## 2020-02-25 DIAGNOSIS — I471 Supraventricular tachycardia: Secondary | ICD-10-CM | POA: Insufficient documentation

## 2020-02-26 ENCOUNTER — Encounter: Payer: Medicare Other | Admitting: Internal Medicine

## 2020-02-26 ENCOUNTER — Telehealth: Payer: Self-pay

## 2020-02-26 NOTE — Telephone Encounter (Signed)
Patient will be disenrolled from [facility] due to hospice care. Appointment on 02/28/20 cancelled.   Debbora Dus, PharmD Clinical Pharmacist [facility] at [facility] [phone]

## 2020-02-28 ENCOUNTER — Telehealth: Payer: Medicare Other

## 2020-03-13 IMAGING — CR DG CHEST 2V
3 series · 3 of 3 positions shown · non-contrast
Comparison: 09/06/2017

CLINICAL DATA: Pt presents for evaluation of sob, 5 lb weight gain
overnight and palpitations. Hx of afib, on elliquis. Hx of CHF.

EXAM:
CHEST - 2 VIEW

[chest lat (1 of 2)]
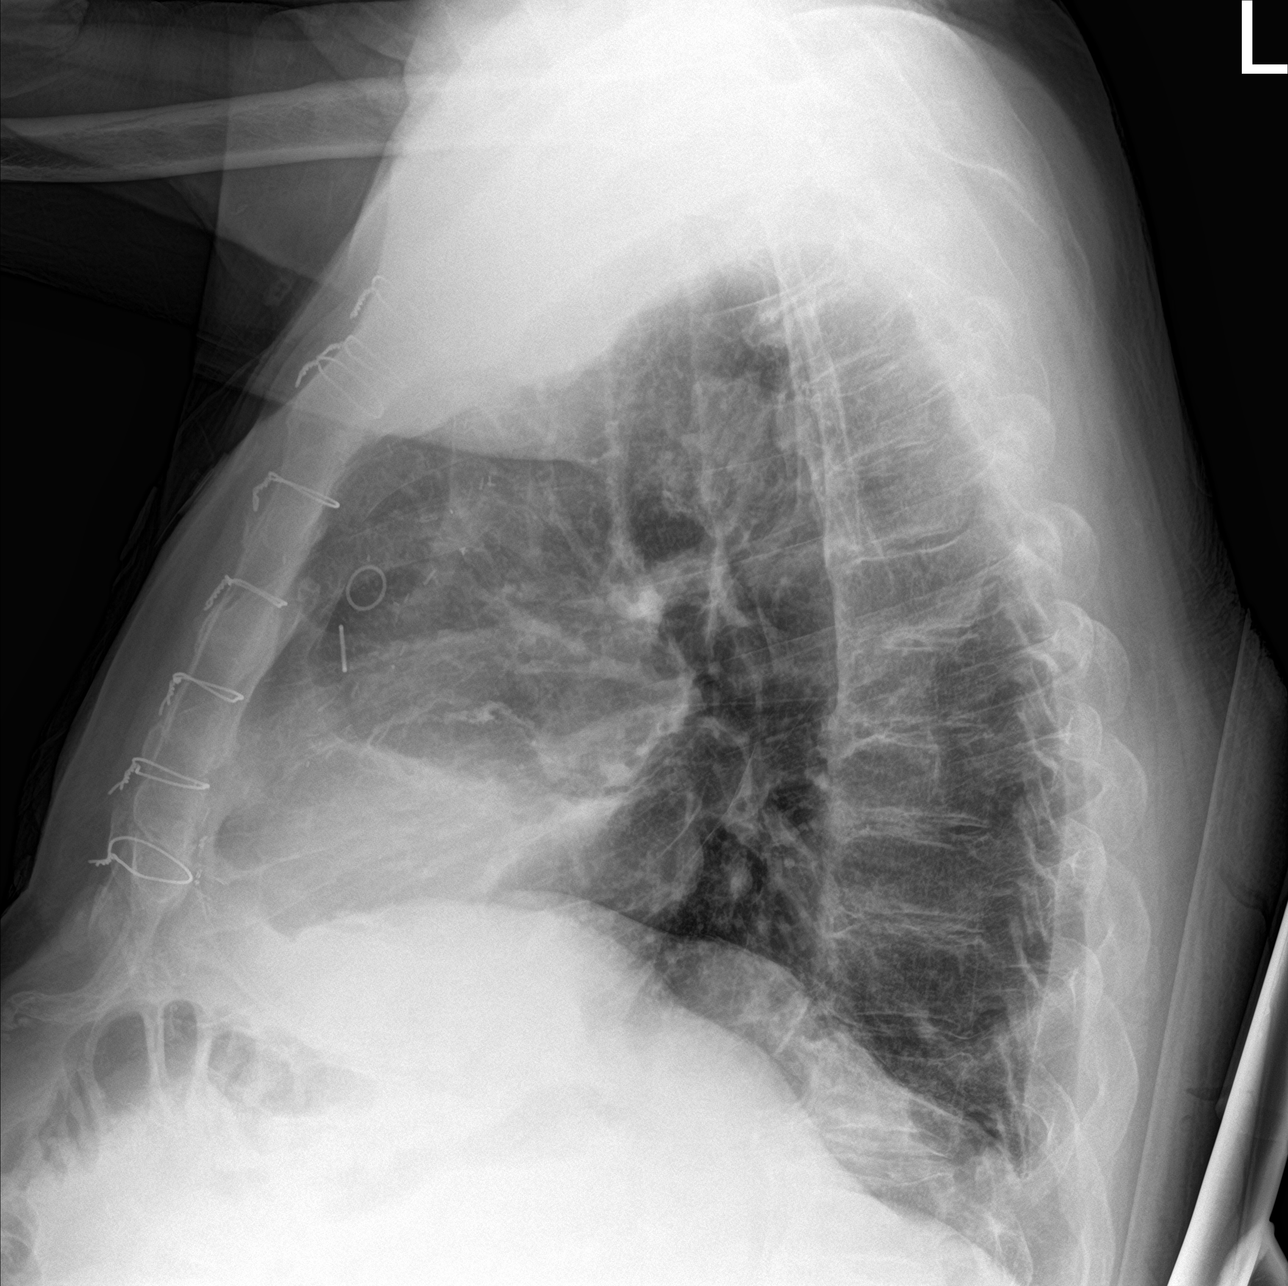

[chest ap]
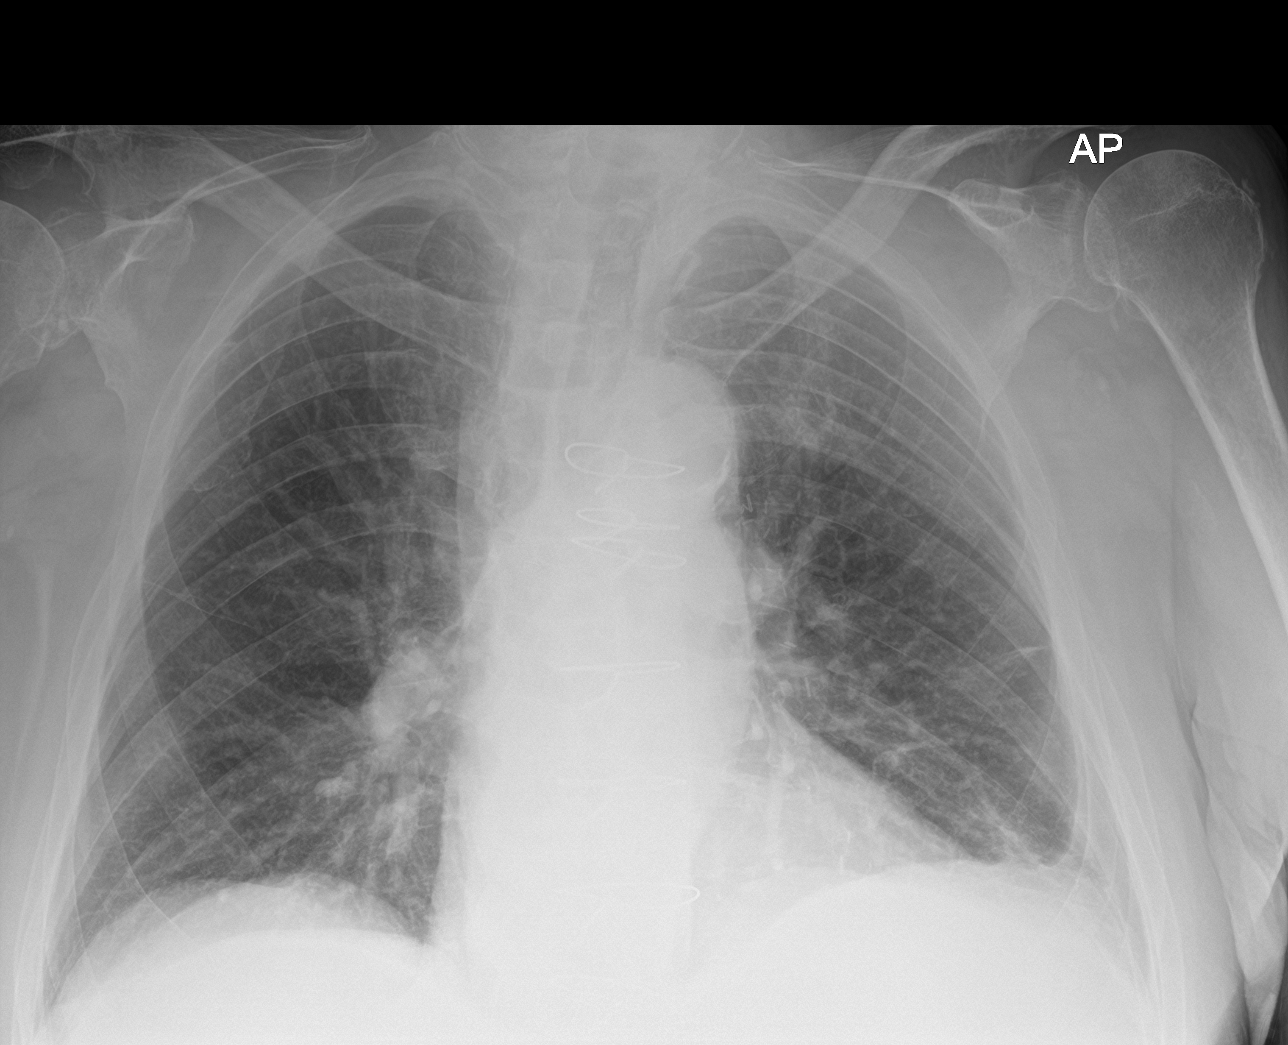

[chest lat (2 of 2)]
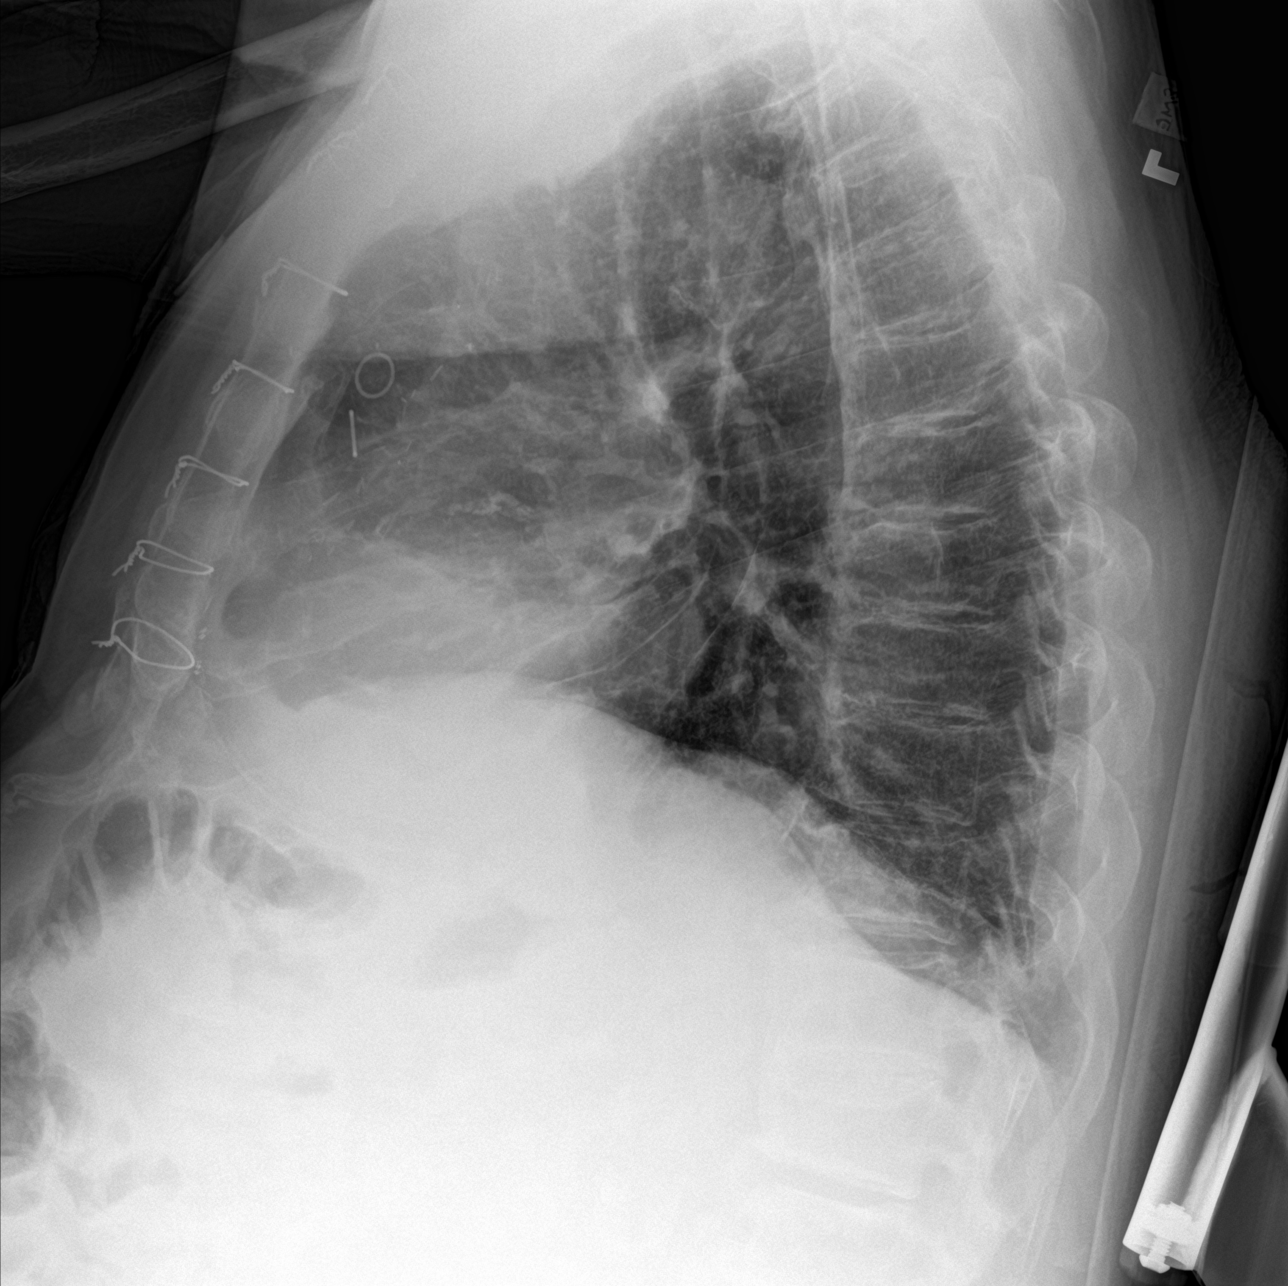

[3 of 3 positions shown; findings below may reference images not displayed]

FINDINGS: There stable changes from prior CABG surgery. The cardiac silhouette
is normal in size. No mediastinal or hilar masses or evidence of
adenopathy.

Linear and reticular opacity is noted in the left upper lobe lingula
consistent with scarring, stable. Lungs are otherwise clear. No
pleural effusion or pneumothorax.

There is a mild compression fracture the upper lumbar spine, stable.
No acute skeletal abnormality.
IMPRESSION: No acute cardiopulmonary disease. No evidence of congestive heart
failure.

## 2020-03-20 ENCOUNTER — Telehealth: Payer: Self-pay | Admitting: Family Medicine

## 2020-03-20 NOTE — Telephone Encounter (Signed)
Called son [name] to express my condolences, no answer . Will try later.

## 2020-03-21 NOTE — Telephone Encounter (Signed)
Called son [name] to express my condolences, unable to reach. Will try later.

## 2020-03-21 NOTE — Telephone Encounter (Signed)
Called [name] - unable to reach either.

## 2020-03-22 NOTE — Telephone Encounter (Signed)
Spoke with [facility]IL [name], offered my condolences. Pt was in hospice at home for 2 wks then transitioned to hospice house for 3 wks prior to passing.

## 2020-04-03 DEATH — deceased

## 2020-11-12 ENCOUNTER — Other Ambulatory Visit: Payer: Medicare Other

## 2021-12-13 IMAGING — CT CT HEAD W/O CM
3 series · 14 of 47 positions shown, 16 images · non-contrast
Comparison: None.

CLINICAL DATA: 87-year-old male with head trauma.

EXAM:
CT HEAD WITHOUT CONTRAST
CT CERVICAL SPINE WITHOUT CONTRAST
TECHNIQUE: Multidetector CT imaging of the head and cervical spine was
performed following the standard protocol without intravenous
contrast. Multiplanar CT image reconstructions of the cervical spine
were also generated.

[Series 2: head wo · axial · 0.46mm/px · z∈[-181,-51]mm · 8 of 32 slices shown, 10 images]
[im 3/32  brain]
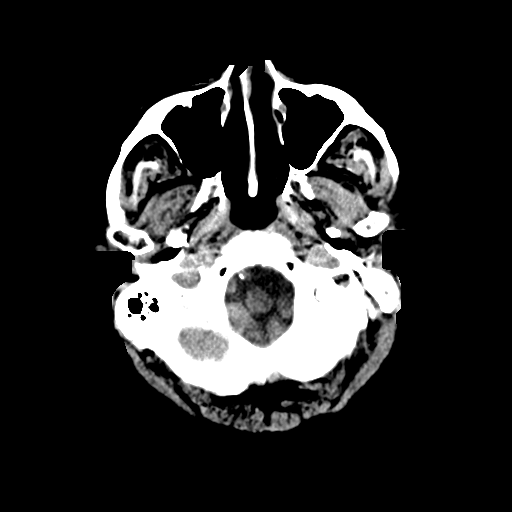
[im 3/32  bone]
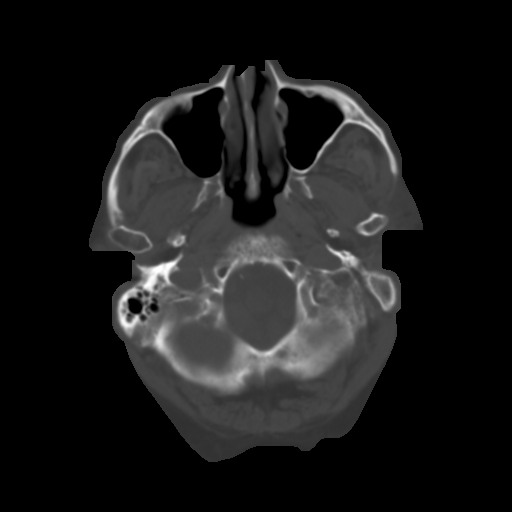
[im 7/32  brain]
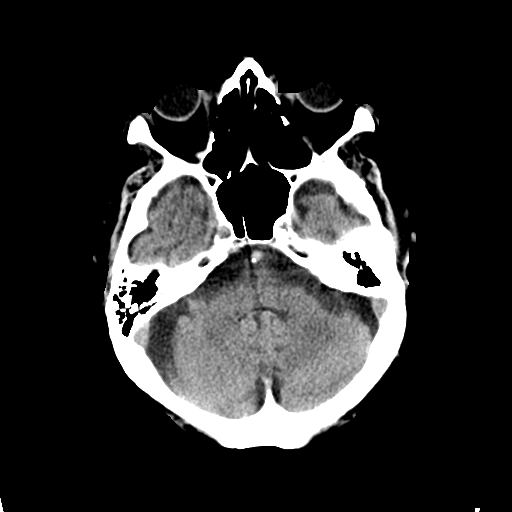
[im 10/32  brain]
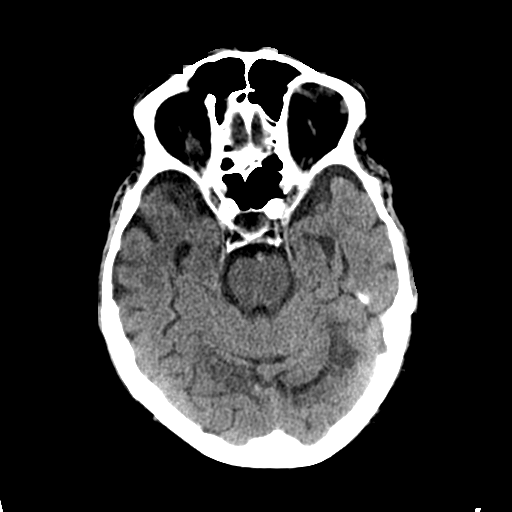
[im 14/32  brain]
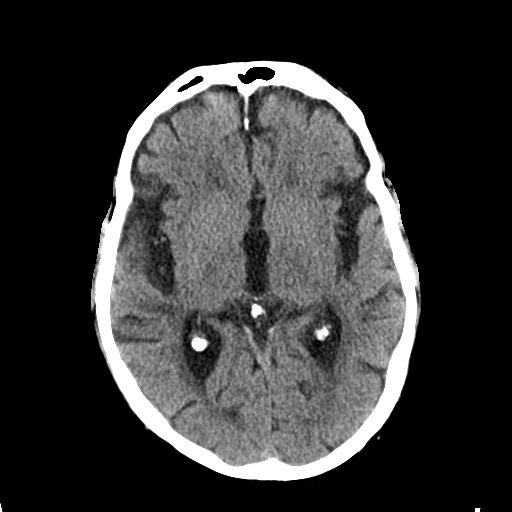
[im 18/32  brain]
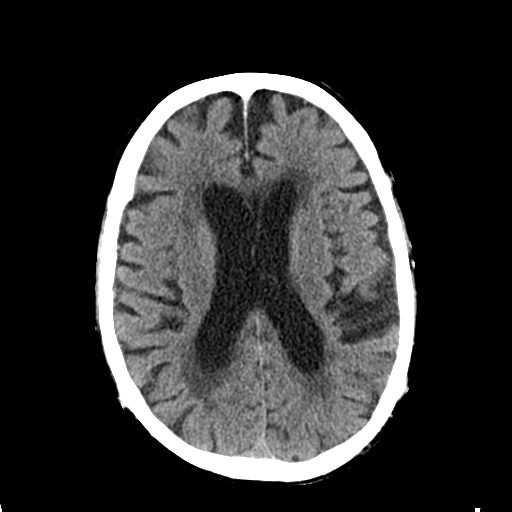
[im 18/32  bone]
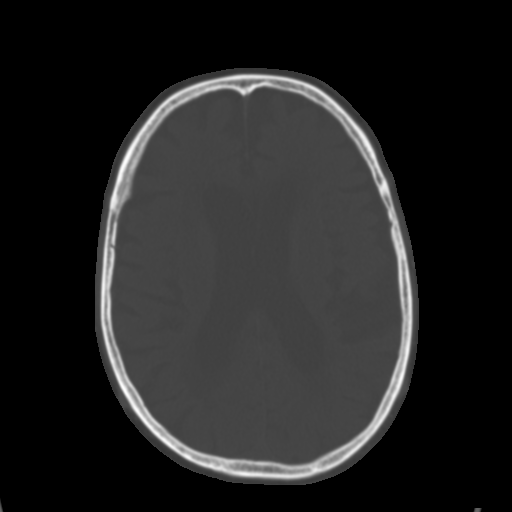
[im 22/32  brain]
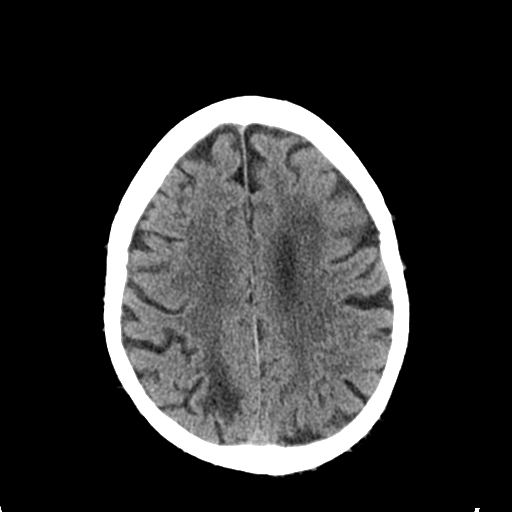
[im 25/32  brain]
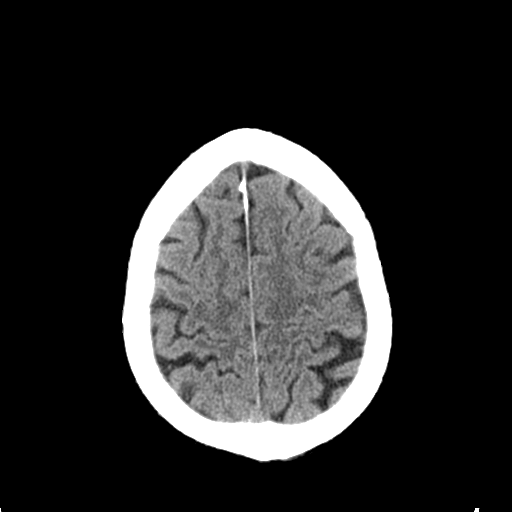
[im 29/32  brain]
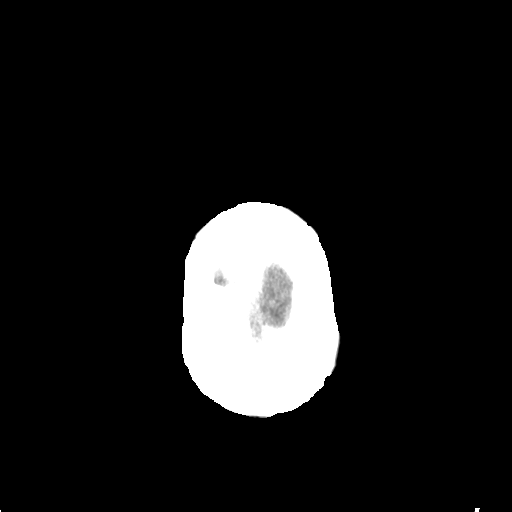

[Series 4: coronal soft tissue · coronal · 0.31mm/px · 3 of 65 slices shown]
[im 22/65  brain]
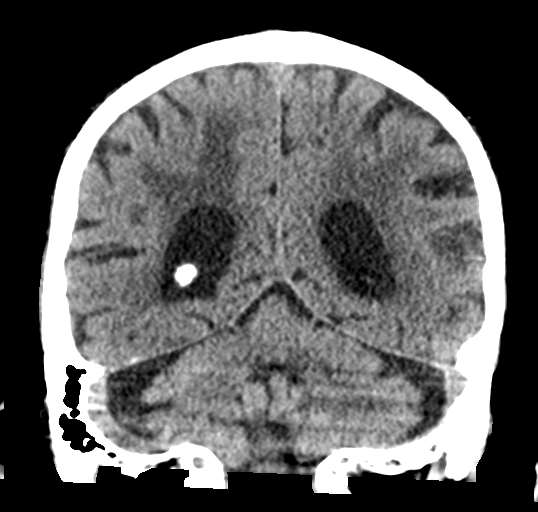
[im 29/65  brain]
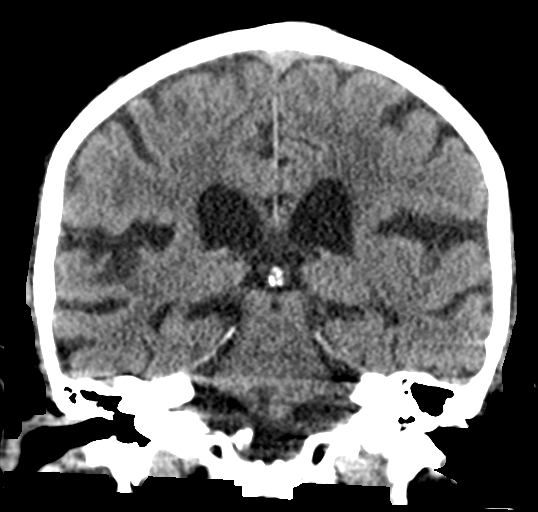
[im 36/65  brain]
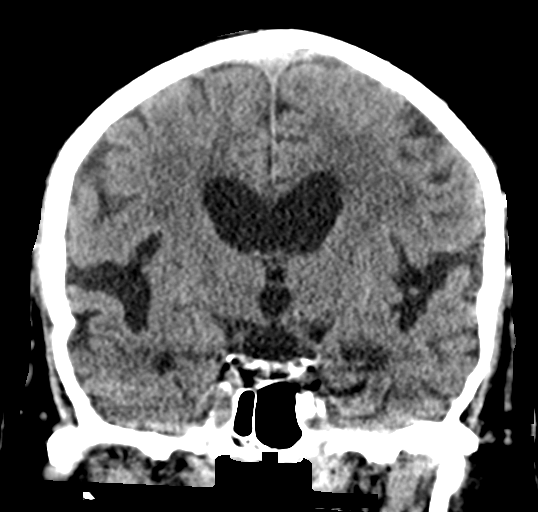

[Series 5: sagittal soft tissue · sagittal · 0.29mm/px · 3 of 52 slices shown]
[im 18/52  brain]
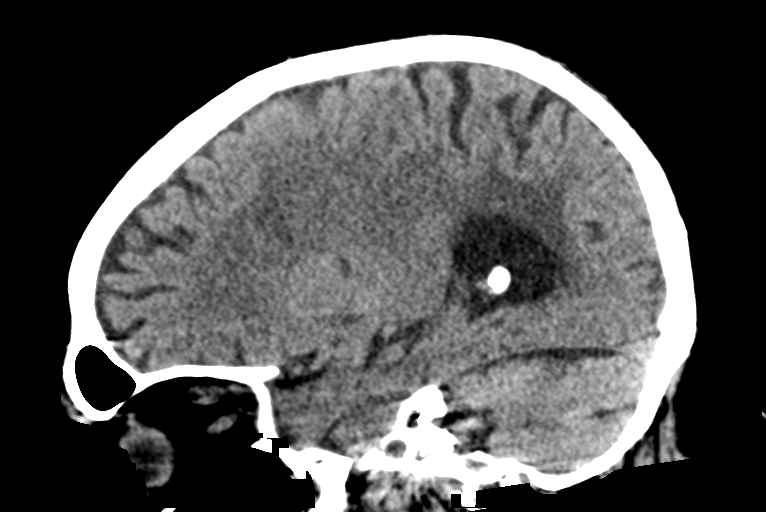
[im 26/52  brain]
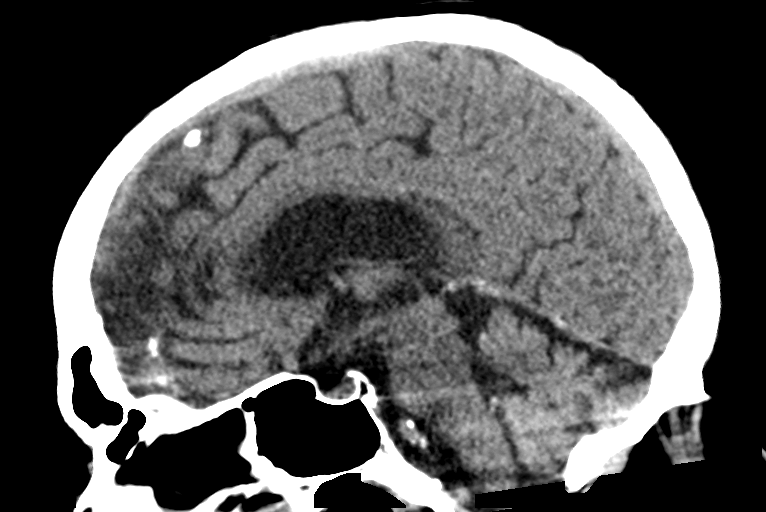
[im 35/52  brain]
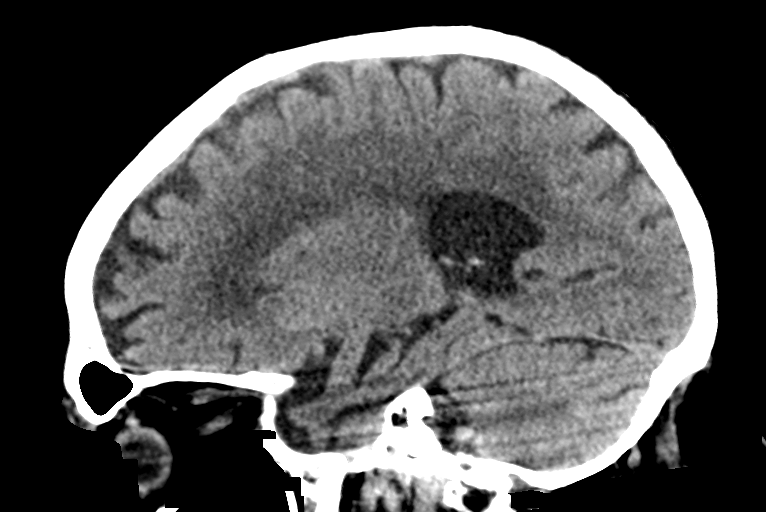

[14 of 47 positions shown; findings below may reference images not displayed]

FINDINGS: CT HEAD FINDINGS

Brain: There is moderate age-related atrophy and chronic
microvascular ischemic changes. Patchy area of white matter
hypodensity in the right posterior parietal convexity, likely
chronic microvascular ischemic changes. Small focal area of old
infarct noted in the right temporal lobe. There is no acute
intracranial hemorrhage. No mass effect or midline shift. No
extra-axial fluid collection.

Vascular: No hyperdense vessel or unexpected calcification.

Skull: Normal. Negative for fracture or focal lesion.

Sinuses/Orbits: No acute finding.

Other: None

CT CERVICAL SPINE FINDINGS

Alignment: No acute subluxation.

Skull base and vertebrae: No acute fracture. Osteopenia.

Soft tissues and spinal canal: No prevertebral fluid or swelling. No
visible canal hematoma.

Disc levels: Multilevel degenerative changes with endplate
irregularity and disc space narrowing and bone spurring and
osteophyte.

Upper chest: Negative.

Other: Bilateral carotid bulb calcified plaques. Partially
visualized pacemaker wire.
IMPRESSION: 1. No acute intracranial hemorrhage.
2. Age-related atrophy and chronic microvascular ischemic changes.
3. No acute/traumatic cervical spine pathology.
